# Patient Record
Sex: Female | Born: 1955 | Race: White | Hispanic: No | Marital: Single | Smoking: Current every day smoker
Health system: Southern US, Community
[De-identification: ages and names within clinical notes are randomized; demographics above are authoritative.]

## PROBLEM LIST (undated history)

## (undated) DIAGNOSIS — G43909 Migraine, unspecified, not intractable, without status migrainosus: Secondary | ICD-10-CM

## (undated) DIAGNOSIS — F329 Major depressive disorder, single episode, unspecified: Secondary | ICD-10-CM

## (undated) DIAGNOSIS — G20A1 Parkinson's disease without dyskinesia, without mention of fluctuations: Secondary | ICD-10-CM

## (undated) DIAGNOSIS — K3184 Gastroparesis: Secondary | ICD-10-CM

## (undated) DIAGNOSIS — C801 Malignant (primary) neoplasm, unspecified: Secondary | ICD-10-CM

## (undated) DIAGNOSIS — M549 Dorsalgia, unspecified: Secondary | ICD-10-CM

## (undated) DIAGNOSIS — T753XXA Motion sickness, initial encounter: Secondary | ICD-10-CM

## (undated) DIAGNOSIS — Z972 Presence of dental prosthetic device (complete) (partial): Secondary | ICD-10-CM

## (undated) DIAGNOSIS — E039 Hypothyroidism, unspecified: Secondary | ICD-10-CM

## (undated) DIAGNOSIS — M7550 Bursitis of unspecified shoulder: Secondary | ICD-10-CM

## (undated) DIAGNOSIS — G20 Parkinson's disease: Secondary | ICD-10-CM

## (undated) DIAGNOSIS — N3941 Urge incontinence: Secondary | ICD-10-CM

## (undated) DIAGNOSIS — K219 Gastro-esophageal reflux disease without esophagitis: Secondary | ICD-10-CM

## (undated) DIAGNOSIS — J449 Chronic obstructive pulmonary disease, unspecified: Secondary | ICD-10-CM

## (undated) DIAGNOSIS — F32A Depression, unspecified: Secondary | ICD-10-CM

## (undated) DIAGNOSIS — B001 Herpesviral vesicular dermatitis: Secondary | ICD-10-CM

## (undated) DIAGNOSIS — G4700 Insomnia, unspecified: Secondary | ICD-10-CM

## (undated) DIAGNOSIS — H9190 Unspecified hearing loss, unspecified ear: Secondary | ICD-10-CM

## (undated) HISTORY — DX: Parkinson's disease: G20

## (undated) HISTORY — PX: CARPAL TUNNEL RELEASE: SHX101

## (undated) HISTORY — DX: Dorsalgia, unspecified: M54.9

## (undated) HISTORY — DX: Gastro-esophageal reflux disease without esophagitis: K21.9

## (undated) HISTORY — DX: Parkinson's disease without dyskinesia, without mention of fluctuations: G20.A1

## (undated) HISTORY — DX: Depression, unspecified: F32.A

## (undated) HISTORY — PX: TONSILLECTOMY AND ADENOIDECTOMY: SUR1326

## (undated) HISTORY — DX: Major depressive disorder, single episode, unspecified: F32.9

## (undated) HISTORY — DX: Urge incontinence: N39.41

## (undated) HISTORY — DX: Gastroparesis: K31.84

## (undated) HISTORY — PX: LAPAROSCOPY: SHX197

## (undated) HISTORY — DX: Herpesviral vesicular dermatitis: B00.1

## (undated) HISTORY — DX: Bursitis of unspecified shoulder: M75.50

## (undated) HISTORY — PX: APPENDECTOMY: SHX54

## (undated) HISTORY — DX: Migraine, unspecified, not intractable, without status migrainosus: G43.909

## (undated) HISTORY — DX: Insomnia, unspecified: G47.00

## (undated) HISTORY — DX: Hypothyroidism, unspecified: E03.9

---

## 1988-12-26 HISTORY — PX: OVARIAN CYST REMOVAL: SHX89

## 1994-11-25 ENCOUNTER — Encounter: Payer: Self-pay | Admitting: Family Medicine

## 1997-12-26 HISTORY — PX: ABDOMINAL HYSTERECTOMY: SHX81

## 1999-04-29 ENCOUNTER — Other Ambulatory Visit: Admission: RE | Admit: 1999-04-29 | Discharge: 1999-04-29 | Payer: Self-pay | Admitting: Family Medicine

## 2001-05-26 ENCOUNTER — Encounter (INDEPENDENT_AMBULATORY_CARE_PROVIDER_SITE_OTHER): Payer: Self-pay | Admitting: Internal Medicine

## 2002-02-10 ENCOUNTER — Encounter: Payer: Self-pay | Admitting: Family Medicine

## 2003-09-29 ENCOUNTER — Ambulatory Visit (HOSPITAL_COMMUNITY): Admission: RE | Admit: 2003-09-29 | Discharge: 2003-09-29 | Payer: Self-pay | Admitting: Neurosurgery

## 2003-09-29 ENCOUNTER — Encounter: Payer: Self-pay | Admitting: Neurosurgery

## 2003-10-10 ENCOUNTER — Encounter: Payer: Self-pay | Admitting: Neurosurgery

## 2003-10-10 ENCOUNTER — Encounter: Admission: RE | Admit: 2003-10-10 | Discharge: 2003-10-10 | Payer: Self-pay | Admitting: Neurosurgery

## 2003-11-04 ENCOUNTER — Encounter: Admission: RE | Admit: 2003-11-04 | Discharge: 2003-11-04 | Payer: Self-pay | Admitting: Neurosurgery

## 2004-08-16 ENCOUNTER — Encounter: Payer: Self-pay | Admitting: Family Medicine

## 2004-10-26 ENCOUNTER — Ambulatory Visit: Payer: Self-pay | Admitting: Family Medicine

## 2004-12-03 ENCOUNTER — Ambulatory Visit: Payer: Self-pay | Admitting: Family Medicine

## 2006-01-26 HISTORY — PX: ESOPHAGOGASTRODUODENOSCOPY: SHX1529

## 2006-03-07 ENCOUNTER — Ambulatory Visit: Payer: Self-pay | Admitting: Family Medicine

## 2006-03-15 ENCOUNTER — Ambulatory Visit: Payer: Self-pay | Admitting: Gastroenterology

## 2006-03-20 ENCOUNTER — Encounter (INDEPENDENT_AMBULATORY_CARE_PROVIDER_SITE_OTHER): Payer: Self-pay | Admitting: *Deleted

## 2006-03-20 ENCOUNTER — Ambulatory Visit (HOSPITAL_COMMUNITY): Admission: RE | Admit: 2006-03-20 | Discharge: 2006-03-20 | Payer: Self-pay | Admitting: Gastroenterology

## 2006-03-30 ENCOUNTER — Ambulatory Visit: Payer: Self-pay | Admitting: Family Medicine

## 2006-04-04 ENCOUNTER — Ambulatory Visit: Payer: Self-pay | Admitting: Family Medicine

## 2006-05-10 ENCOUNTER — Ambulatory Visit: Payer: Self-pay | Admitting: Family Medicine

## 2006-09-15 ENCOUNTER — Ambulatory Visit: Payer: Self-pay | Admitting: Family Medicine

## 2006-12-13 ENCOUNTER — Ambulatory Visit: Payer: Self-pay | Admitting: Family Medicine

## 2007-02-28 ENCOUNTER — Ambulatory Visit: Payer: Self-pay | Admitting: Family Medicine

## 2007-02-28 LAB — CONVERTED CEMR LAB
Free T4: 0.8 ng/dL (ref 0.6–1.6)
TSH: 2.8 microintl units/mL (ref 0.35–5.50)

## 2007-05-15 ENCOUNTER — Telehealth (INDEPENDENT_AMBULATORY_CARE_PROVIDER_SITE_OTHER): Payer: Self-pay | Admitting: *Deleted

## 2007-06-04 ENCOUNTER — Telehealth (INDEPENDENT_AMBULATORY_CARE_PROVIDER_SITE_OTHER): Payer: Self-pay | Admitting: *Deleted

## 2007-06-05 ENCOUNTER — Ambulatory Visit: Payer: Self-pay | Admitting: Family Medicine

## 2007-06-05 DIAGNOSIS — K219 Gastro-esophageal reflux disease without esophagitis: Secondary | ICD-10-CM

## 2007-06-05 DIAGNOSIS — G43909 Migraine, unspecified, not intractable, without status migrainosus: Secondary | ICD-10-CM | POA: Insufficient documentation

## 2007-06-05 DIAGNOSIS — F319 Bipolar disorder, unspecified: Secondary | ICD-10-CM

## 2007-06-05 DIAGNOSIS — K589 Irritable bowel syndrome without diarrhea: Secondary | ICD-10-CM

## 2007-06-05 DIAGNOSIS — IMO0001 Reserved for inherently not codable concepts without codable children: Secondary | ICD-10-CM

## 2007-06-05 DIAGNOSIS — E039 Hypothyroidism, unspecified: Secondary | ICD-10-CM

## 2007-06-05 DIAGNOSIS — N6019 Diffuse cystic mastopathy of unspecified breast: Secondary | ICD-10-CM

## 2007-06-05 DIAGNOSIS — K449 Diaphragmatic hernia without obstruction or gangrene: Secondary | ICD-10-CM

## 2007-06-05 DIAGNOSIS — F310 Bipolar disorder, current episode hypomanic: Secondary | ICD-10-CM | POA: Insufficient documentation

## 2007-06-05 DIAGNOSIS — K3184 Gastroparesis: Secondary | ICD-10-CM

## 2007-06-05 LAB — CONVERTED CEMR LAB: Glucose, Bld: 97 mg/dL

## 2007-06-06 LAB — CONVERTED CEMR LAB
BUN: 3 mg/dL — ABNORMAL LOW (ref 6–23)
Basophils Relative: 1 % (ref 0.0–1.0)
Bilirubin, Direct: 0.1 mg/dL (ref 0.0–0.3)
Calcium: 10.2 mg/dL (ref 8.4–10.5)
Chloride: 106 meq/L (ref 96–112)
Eosinophils Absolute: 0.1 10*3/uL (ref 0.0–0.6)
Eosinophils Relative: 0.9 % (ref 0.0–5.0)
GFR calc Af Amer: 98 mL/min
GFR calc non Af Amer: 81 mL/min
Hemoglobin: 14.7 g/dL (ref 12.0–15.0)
Lymphocytes Relative: 23.4 % (ref 12.0–46.0)
MCV: 91.6 fL (ref 78.0–100.0)
Monocytes Absolute: 0.8 10*3/uL — ABNORMAL HIGH (ref 0.2–0.7)
Neutro Abs: 9.1 10*3/uL — ABNORMAL HIGH (ref 1.4–7.7)
Platelets: 280 10*3/uL (ref 150–400)
Potassium: 4 meq/L (ref 3.5–5.1)
Rhuematoid fact SerPl-aCnc: <20 intl units/mL — ABNORMAL LOW (ref 0.0–20.0)
Sed Rate: 13 mm/hr (ref 0–25)
TSH: 0.65 microintl units/mL (ref 0.35–5.50)
Total Bilirubin: 0.7 mg/dL (ref 0.3–1.2)
Total Protein: 6.9 g/dL (ref 6.0–8.3)
WBC: 13.2 10*3/uL — ABNORMAL HIGH (ref 4.5–10.5)

## 2007-06-11 ENCOUNTER — Telehealth: Payer: Self-pay | Admitting: Family Medicine

## 2007-06-13 ENCOUNTER — Encounter (INDEPENDENT_AMBULATORY_CARE_PROVIDER_SITE_OTHER): Payer: Self-pay | Admitting: Internal Medicine

## 2007-06-13 DIAGNOSIS — N35919 Unspecified urethral stricture, male, unspecified site: Secondary | ICD-10-CM

## 2007-10-31 ENCOUNTER — Ambulatory Visit: Payer: Self-pay | Admitting: Internal Medicine

## 2007-10-31 DIAGNOSIS — M25519 Pain in unspecified shoulder: Secondary | ICD-10-CM | POA: Insufficient documentation

## 2007-11-15 ENCOUNTER — Encounter (INDEPENDENT_AMBULATORY_CARE_PROVIDER_SITE_OTHER): Payer: Self-pay | Admitting: Internal Medicine

## 2007-12-03 ENCOUNTER — Encounter (INDEPENDENT_AMBULATORY_CARE_PROVIDER_SITE_OTHER): Payer: Self-pay | Admitting: Internal Medicine

## 2008-02-06 ENCOUNTER — Encounter (INDEPENDENT_AMBULATORY_CARE_PROVIDER_SITE_OTHER): Payer: Self-pay | Admitting: Internal Medicine

## 2008-02-07 ENCOUNTER — Telehealth (INDEPENDENT_AMBULATORY_CARE_PROVIDER_SITE_OTHER): Payer: Self-pay | Admitting: Internal Medicine

## 2008-02-27 ENCOUNTER — Ambulatory Visit: Payer: Self-pay | Admitting: Family Medicine

## 2008-02-27 ENCOUNTER — Telehealth: Payer: Self-pay | Admitting: Family Medicine

## 2008-03-27 ENCOUNTER — Telehealth: Payer: Self-pay | Admitting: Family Medicine

## 2008-03-31 ENCOUNTER — Telehealth (INDEPENDENT_AMBULATORY_CARE_PROVIDER_SITE_OTHER): Payer: Self-pay | Admitting: Internal Medicine

## 2008-05-15 ENCOUNTER — Ambulatory Visit: Payer: Self-pay | Admitting: Family Medicine

## 2008-09-24 ENCOUNTER — Ambulatory Visit: Payer: Self-pay | Admitting: Family Medicine

## 2008-09-24 DIAGNOSIS — G5600 Carpal tunnel syndrome, unspecified upper limb: Secondary | ICD-10-CM

## 2008-09-24 DIAGNOSIS — K149 Disease of tongue, unspecified: Secondary | ICD-10-CM

## 2008-09-24 DIAGNOSIS — R635 Abnormal weight gain: Secondary | ICD-10-CM | POA: Insufficient documentation

## 2008-10-30 ENCOUNTER — Ambulatory Visit: Payer: Self-pay | Admitting: Family Medicine

## 2008-12-15 ENCOUNTER — Telehealth: Payer: Self-pay | Admitting: Family Medicine

## 2009-01-06 ENCOUNTER — Ambulatory Visit: Payer: Self-pay | Admitting: Family Medicine

## 2009-01-20 ENCOUNTER — Telehealth: Payer: Self-pay | Admitting: Family Medicine

## 2009-04-16 ENCOUNTER — Ambulatory Visit: Payer: Self-pay | Admitting: Family Medicine

## 2009-04-16 DIAGNOSIS — N3941 Urge incontinence: Secondary | ICD-10-CM

## 2009-04-16 DIAGNOSIS — Z87448 Personal history of other diseases of urinary system: Secondary | ICD-10-CM

## 2009-04-17 ENCOUNTER — Telehealth: Payer: Self-pay | Admitting: Family Medicine

## 2009-04-17 LAB — CONVERTED CEMR LAB: Lithium Lvl: 0.83 meq/L (ref 0.80–1.40)

## 2009-05-04 ENCOUNTER — Ambulatory Visit: Payer: Self-pay | Admitting: Urology

## 2009-05-12 ENCOUNTER — Ambulatory Visit: Payer: Self-pay | Admitting: Family Medicine

## 2009-05-12 ENCOUNTER — Ambulatory Visit: Payer: Self-pay | Admitting: Gastroenterology

## 2009-05-12 DIAGNOSIS — R1110 Vomiting, unspecified: Secondary | ICD-10-CM | POA: Insufficient documentation

## 2009-05-12 DIAGNOSIS — R1011 Right upper quadrant pain: Secondary | ICD-10-CM

## 2009-05-12 DIAGNOSIS — R259 Unspecified abnormal involuntary movements: Secondary | ICD-10-CM | POA: Insufficient documentation

## 2009-05-12 DIAGNOSIS — R112 Nausea with vomiting, unspecified: Secondary | ICD-10-CM

## 2009-05-12 LAB — CONVERTED CEMR LAB
ALT: 25 units/L (ref 0–35)
Alkaline Phosphatase: 119 units/L — ABNORMAL HIGH (ref 39–117)
Basophils Absolute: 0.1 10*3/uL (ref 0.0–0.1)
CO2: 31 meq/L (ref 19–32)
Creatinine, Ser: 0.8 mg/dL (ref 0.4–1.2)
GFR calc non Af Amer: 80.55 mL/min (ref >60)
HCT: 41.9 % (ref 36.0–46.0)
Hemoglobin: 14.8 g/dL (ref 12.0–15.0)
Lymphs Abs: 3 10*3/uL (ref 0.7–4.0)
MCHC: 35.4 g/dL (ref 30.0–36.0)
MCV: 93.4 fL (ref 78.0–100.0)
Monocytes Absolute: 1 10*3/uL (ref 0.1–1.0)
Monocytes Relative: 6.8 % (ref 3.0–12.0)
Neutro Abs: 10 10*3/uL — ABNORMAL HIGH (ref 1.4–7.7)
RDW: 12.3 % (ref 11.5–14.6)
Total Bilirubin: 0.4 mg/dL (ref 0.3–1.2)

## 2009-05-13 ENCOUNTER — Telehealth: Payer: Self-pay | Admitting: Gastroenterology

## 2009-05-18 ENCOUNTER — Ambulatory Visit (HOSPITAL_COMMUNITY): Admission: RE | Admit: 2009-05-18 | Discharge: 2009-05-18 | Payer: Self-pay | Admitting: Gastroenterology

## 2009-05-21 ENCOUNTER — Encounter: Payer: Self-pay | Admitting: Family Medicine

## 2009-06-30 ENCOUNTER — Ambulatory Visit: Payer: Self-pay | Admitting: Gastroenterology

## 2009-07-17 ENCOUNTER — Encounter: Payer: Self-pay | Admitting: Family Medicine

## 2009-08-09 ENCOUNTER — Ambulatory Visit: Payer: Self-pay | Admitting: Internal Medicine

## 2009-08-13 ENCOUNTER — Ambulatory Visit: Payer: Self-pay | Admitting: Internal Medicine

## 2009-08-19 ENCOUNTER — Encounter: Payer: Self-pay | Admitting: Family Medicine

## 2009-08-26 ENCOUNTER — Telehealth: Payer: Self-pay | Admitting: Family Medicine

## 2009-09-02 ENCOUNTER — Ambulatory Visit: Payer: Self-pay | Admitting: Internal Medicine

## 2009-09-17 ENCOUNTER — Ambulatory Visit: Payer: Self-pay | Admitting: Otolaryngology

## 2009-12-02 ENCOUNTER — Telehealth: Payer: Self-pay | Admitting: Family Medicine

## 2009-12-07 ENCOUNTER — Encounter (INDEPENDENT_AMBULATORY_CARE_PROVIDER_SITE_OTHER): Payer: Self-pay | Admitting: *Deleted

## 2009-12-24 ENCOUNTER — Ambulatory Visit: Payer: Self-pay | Admitting: Family Medicine

## 2010-01-15 ENCOUNTER — Encounter: Payer: Self-pay | Admitting: Family Medicine

## 2010-01-19 ENCOUNTER — Telehealth: Payer: Self-pay | Admitting: Family Medicine

## 2010-03-03 ENCOUNTER — Telehealth: Payer: Self-pay | Admitting: Family Medicine

## 2010-03-23 ENCOUNTER — Encounter: Payer: Self-pay | Admitting: Family Medicine

## 2010-03-25 ENCOUNTER — Ambulatory Visit: Payer: Self-pay | Admitting: Family Medicine

## 2010-03-25 DIAGNOSIS — D72829 Elevated white blood cell count, unspecified: Secondary | ICD-10-CM | POA: Insufficient documentation

## 2010-03-26 LAB — CONVERTED CEMR LAB
Basophils Relative: 0.3 % (ref 0.0–3.0)
CO2: 26 meq/L (ref 19–32)
Chloride: 105 meq/L (ref 96–112)
Creatinine, Ser: 1 mg/dL (ref 0.4–1.2)
Eosinophils Absolute: 0.6 10*3/uL (ref 0.0–0.7)
Hemoglobin: 14.4 g/dL (ref 12.0–15.0)
Lymphs Abs: 3.9 10*3/uL (ref 0.7–4.0)
MCHC: 34.1 g/dL (ref 30.0–36.0)
MCV: 95.7 fL (ref 78.0–100.0)
Monocytes Absolute: 0.7 10*3/uL (ref 0.1–1.0)
Neutro Abs: 11.2 10*3/uL — ABNORMAL HIGH (ref 1.4–7.7)
RBC: 4.43 M/uL (ref 3.87–5.11)
Sodium: 143 meq/L (ref 135–145)

## 2010-04-12 ENCOUNTER — Ambulatory Visit: Payer: Self-pay | Admitting: Family Medicine

## 2010-04-12 ENCOUNTER — Ambulatory Visit: Payer: Self-pay | Admitting: Internal Medicine

## 2010-04-19 ENCOUNTER — Emergency Department: Payer: Self-pay | Admitting: Emergency Medicine

## 2010-04-19 ENCOUNTER — Encounter: Payer: Self-pay | Admitting: Family Medicine

## 2010-04-28 ENCOUNTER — Encounter: Payer: Self-pay | Admitting: Family Medicine

## 2010-04-29 ENCOUNTER — Telehealth: Payer: Self-pay | Admitting: Family Medicine

## 2010-04-30 ENCOUNTER — Encounter: Payer: Self-pay | Admitting: Family Medicine

## 2010-05-03 ENCOUNTER — Telehealth: Payer: Self-pay | Admitting: Family Medicine

## 2010-05-04 ENCOUNTER — Encounter: Payer: Self-pay | Admitting: Family Medicine

## 2010-05-04 ENCOUNTER — Emergency Department: Payer: Self-pay | Admitting: Internal Medicine

## 2010-05-06 ENCOUNTER — Ambulatory Visit: Payer: Self-pay | Admitting: Family Medicine

## 2010-06-01 ENCOUNTER — Telehealth (INDEPENDENT_AMBULATORY_CARE_PROVIDER_SITE_OTHER): Payer: Self-pay | Admitting: *Deleted

## 2010-06-01 ENCOUNTER — Telehealth: Payer: Self-pay | Admitting: Family Medicine

## 2010-06-02 ENCOUNTER — Telehealth: Payer: Self-pay | Admitting: Family Medicine

## 2010-06-03 ENCOUNTER — Telehealth: Payer: Self-pay | Admitting: Family Medicine

## 2010-06-15 ENCOUNTER — Ambulatory Visit: Payer: Self-pay | Admitting: Gastroenterology

## 2010-07-19 ENCOUNTER — Telehealth: Payer: Self-pay | Admitting: Family Medicine

## 2010-08-03 ENCOUNTER — Telehealth: Payer: Self-pay | Admitting: Gastroenterology

## 2010-08-03 ENCOUNTER — Encounter (INDEPENDENT_AMBULATORY_CARE_PROVIDER_SITE_OTHER): Payer: Self-pay | Admitting: *Deleted

## 2010-08-26 ENCOUNTER — Telehealth: Payer: Self-pay | Admitting: Family Medicine

## 2010-08-31 ENCOUNTER — Ambulatory Visit: Payer: Self-pay | Admitting: Family Medicine

## 2010-09-06 ENCOUNTER — Telehealth (INDEPENDENT_AMBULATORY_CARE_PROVIDER_SITE_OTHER): Payer: Self-pay | Admitting: *Deleted

## 2010-09-08 ENCOUNTER — Telehealth: Payer: Self-pay | Admitting: Family Medicine

## 2010-09-09 ENCOUNTER — Telehealth: Payer: Self-pay | Admitting: Family Medicine

## 2010-09-29 ENCOUNTER — Ambulatory Visit: Payer: Self-pay | Admitting: Family Medicine

## 2010-09-29 ENCOUNTER — Telehealth (INDEPENDENT_AMBULATORY_CARE_PROVIDER_SITE_OTHER): Payer: Self-pay | Admitting: *Deleted

## 2010-10-01 ENCOUNTER — Telehealth: Payer: Self-pay | Admitting: Family Medicine

## 2010-10-07 ENCOUNTER — Telehealth: Payer: Self-pay | Admitting: Family Medicine

## 2010-10-07 DIAGNOSIS — H00019 Hordeolum externum unspecified eye, unspecified eyelid: Secondary | ICD-10-CM

## 2010-10-22 ENCOUNTER — Encounter: Payer: Self-pay | Admitting: Gastroenterology

## 2010-10-26 ENCOUNTER — Encounter: Payer: Self-pay | Admitting: Family Medicine

## 2010-11-02 ENCOUNTER — Telehealth: Payer: Self-pay | Admitting: Family Medicine

## 2010-11-03 ENCOUNTER — Telehealth: Payer: Self-pay | Admitting: Family Medicine

## 2010-11-03 ENCOUNTER — Encounter: Payer: Self-pay | Admitting: Family Medicine

## 2010-12-28 ENCOUNTER — Encounter: Payer: Self-pay | Admitting: Family Medicine

## 2011-01-10 ENCOUNTER — Telehealth: Payer: Self-pay | Admitting: Family Medicine

## 2011-01-14 ENCOUNTER — Telehealth: Payer: Self-pay | Admitting: Gastroenterology

## 2011-01-27 NOTE — Progress Notes (Signed)
Summary: Rx Chantix and Valtrex  Phone Note Refill Request Call back at [phone] Message from:  [id number] on March 03, 2010 8:44 AM  Refills Requested: Medication #1:  VALTREX 500 MG  TABS Take 1 capsule by mouth two times a day   Last Refilled: 03/29/2009 Request refill on Chantix 1mg  tablet, take one tablet two times a day #60, was removed from med sheet on 04/16/09    Method Requested: Electronic Initial call taken by: Sydell Axon LPN,  March 03, 2010 8:46 AM    New/Updated Medications: CHANTIX 1 MG TABS (VARENICLINE TARTRATE) one tab by mouth two times a day Prescriptions: VALTREX 500 MG  TABS (VALACYCLOVIR HCL) Take 1 capsule by mouth two times a day  #30 x 6   Entered and Authorized by:   Shaune Leeks MD   Signed by:   Shaune Leeks MD on 03/03/2010   Method used:   Electronically to        [facility] [address] (retail)       [address]       [place], [place]  [address]       Ph: [phone]       Fax: [phone]   RxID:   [id number] CHANTIX 1 MG TABS (VARENICLINE TARTRATE) one tab by mouth two times a day  #60 x 5   Entered and Authorized by:   Shaune Leeks MD   Signed by:   Shaune Leeks MD on 03/03/2010   Method used:   Electronically to        [facility] [address] (retail)       [address]       [place], [place]  [address]       Ph: [phone]       Fax: [phone]   RxID:   [id number]

## 2011-01-27 NOTE — Letter (Signed)
Summary: Generic Letter  [place] at [facility]  [address] [place], [place] [address]   Phone: [phone]  Fax: [phone]    11/03/2010  [address]  RE: Leslie Wong     [address]     [place], [place]  [address]  To Whom it may concern:   Ms Leslie Wong is a patient of mine with multiple medical problems which would preclude her from being a productive member of a jury. She has difficulty sitting for any prologed period of time due to back pain with radiculopathy and has been having prolonged difficulty with nausea and vomiting felt most recently due to necessary medications. I do not expect these problems to get appreciably better in the future and think permanent excusal is prudent.  If there are further questions, please feel free to call my office. Thank you for your attention and cooperation in this matter.    Sincerely,     Laurita Quint MD

## 2011-01-27 NOTE — Progress Notes (Signed)
Summary: still wants pain medicine  Phone Note Call from Patient Call back at Home Phone [phone]   Caller: Patient Summary of Call: Pt is insisting that something be called in for her for pain.  She says she does not have a pain doctor in [place], saw a neurologist 3 months ago, is unable to get in touch with anyone there.  She asks what is she supposed to do for her pain.  She says she can take vicodin without any problems. Initial call taken by: Lowella Petties CMA,  September 09, 2010 10:34 AM  Follow-up for Phone Call        No rx for pain meds.  I have prev explained this to the patient. She needs to contact [facility].  If we can help with re-referral, then please make this available to patient.  Follow-up by: Crawford Givens MD,  September 09, 2010 10:58 AM  Additional Follow-up for Phone Call Additional follow up Details #1::        Regina/Cynthia.  I have explained this to the patient over and over again.  I don't know what else to do.  There have been numerous phone conversations about this and last time she told me she was going to get another MD.  Delilah Shan CMA (AAMA)  September 09, 2010 11:01 AM     Additional Follow-up for Phone Call Additional follow up Details #2::    Noted.  Follow-up by: Crawford Givens MD,  September 09, 2010 1:39 PM

## 2011-01-27 NOTE — Progress Notes (Signed)
Summary: f/u appt   Phone Note Outgoing Call Call back at [phone]   Call placed by: Chales Abrahams CMA Duncan Dull),  June 01, 2010 4:51 PM Call placed to: Patient Summary of Call: Dr Christella Hartigan sent a flag to have the pt schedule a follow up with him for nausea and vomiting.  appt made for 06/15/10  pt aware Initial call taken by: Chales Abrahams CMA Duncan Dull),  June 01, 2010 4:51 PM

## 2011-01-27 NOTE — Progress Notes (Signed)
Summary: Yeast infection  Phone Note Call from Patient Call back at [phone] cell   Caller: Patient Call For: Shaune Leeks MD Summary of Call: Dr. Hetty Ely gave pt antibiotic 2 weeks ago  and now has yeast infection, Vaginal discharge with itching and burning in vaginal area. Pt has used Monistat and Vagisil OTC and is no better. Pt would like med called in for yeast infection. Pt uses [facility] in [place],[phone]. Please advise.  Initial call taken by: Lewanda Rife LPN,  January 19, 2010 2:12 PM  Follow-up for Phone Call        Patient notified as instructed by telephone that med had been sent to [facility] in [place].Lewanda Rife LPN  January 19, 2010 5:11 PM     New/Updated Medications: FLUCONAZOLE 150 MG TABS (FLUCONAZOLE) one tab by mouth   once daily Prescriptions: FLUCONAZOLE 150 MG TABS (FLUCONAZOLE) one tab by mouth   once daily  #1 x 0   Entered and Authorized by:   Shaune Leeks MD   Signed by:   Shaune Leeks MD on 01/19/2010   Method used:   Electronically to        [facility] [address] (retail)       [address]       [place], [place]  [address]       Ph: [phone]       Fax: [phone]   RxID:   [id number]

## 2011-01-27 NOTE — Miscellaneous (Signed)
Summary: Zofran refill  Clinical Lists Changes  Medications: Changed medication from ZOFRAN 8 MG TABS (ONDANSETRON HCL) 1 by mouth once daily as needed to [facility] ODT 4 MG TBDP (ONDANSETRON) 1 tab every 4-6 hours as needed - Signed Rx of ZOFRAN ODT 4 MG TBDP (ONDANSETRON) 1 tab every 4-6 hours as needed;  #12 x 0;  Signed;  Entered by: Chales Abrahams CMA (AAMA);  Authorized by: Rachael Fee MD;  Method used: Electronically to [facility] [address], [place], [place], [place]  [address], Ph: [phone], Fax: [phone]    Prescriptions: ZOFRAN ODT 4 MG TBDP (ONDANSETRON) 1 tab every 4-6 hours as needed  #12 x 0   Entered by:   Chales Abrahams CMA (AAMA)   Authorized by:   Rachael Fee MD   Signed by:   Chales Abrahams CMA (AAMA) on 10/22/2010   Method used:   Electronically to        [facility] [address] (retail)       [address]       [place], [place]  [address]       Ph: [phone]       Fax: [phone]   RxID:   [id number]

## 2011-01-27 NOTE — Letter (Signed)
Summary: Dr.Peter Kemper [facility]  Dr.Peter Kemper [facility]   Imported By: Beau Fanny 10/28/2010 16:44:31  _____________________________________________________________________  External Attachment:    Type:   Image     Comment:   External Document

## 2011-01-27 NOTE — Progress Notes (Signed)
Summary: prescription change request   Phone Note From Pharmacy   [facility]: [facility] [address] Summary of Call: request recieved from pharmacy that the pt would like zofran 8 mg ODT tabs instead of ondansetron 8 mg tab.  Is this ok? Initial call taken by: Chales Abrahams CMA Duncan Dull),  September 29, 2010 2:46 PM  Follow-up for Phone Call        yes Follow-up by: Rachael Fee MD,  September 29, 2010 2:55 PM  Additional Follow-up for Phone Call Additional follow up Details #1::        FYI:  Dr Christella Hartigan I just noticed the pt is getting zofran from Dr Para March as well. 08/03/10 30 with 5 refills and then on 08/31/10 Dr Para March gave  her 30 with 5 refills. Additional Follow-up by: Chales Abrahams CMA Duncan Dull),  September 29, 2010 3:05 PM    Additional Follow-up for Phone Call Additional follow up Details #2::    can you ask her about that.  should really only have one prescriber. Follow-up by: Rachael Fee MD,  September 29, 2010 3:06 PM  Additional Follow-up for Phone Call Additional follow up Details #3:: Details for Additional Follow-up Action Taken: phone number given has been disconnected pharmacy notified to have pt contact us before med can be refilled. Chales Abrahams CMA Duncan Dull)  September 29, 2010 3:11 PM   Dr Christella Hartigan I spoke with the pharmacy and they have only given her the rx you sent, the other from Dr Para March is on file.  I will send the Zofran ODT for her she is vomiting up the tablets. Additional Follow-up by: Chales Abrahams CMA Duncan Dull),  September 29, 2010 3:18 PM

## 2011-01-27 NOTE — Progress Notes (Signed)
Summary: wants 3 months of nucynta  Phone Note Call from Patient   Caller: Daughter Dessa Phi  [phone] Summary of Call: Pt's daughter is asking if pt can have 3 month scripts of nucynta so that she doesnt have to drive the 45 minutes that it takes for her to get here. Initial call taken by: Lowella Petties CMA,  June 03, 2010 12:43 PM  Follow-up for Phone Call        I don't prescribe narcotic medication for more than one month at a time...too many potential problems with a 90 day prescription. Follow-up by: Shaune Leeks MD,  June 03, 2010 1:20 PM  Additional Follow-up for Phone Call Additional follow up Details #1::        Advised pt's daughter.                Lowella Petties CMA  June 03, 2010 3:28 PM

## 2011-01-27 NOTE — Letter (Signed)
Summary: Dr.Fletcher Hartsell,Neurology,Note  Dr.Fletcher Hartsell,Neurology,Note   Imported By: Beau Fanny 01/19/2010 14:06:06  _____________________________________________________________________  External Attachment:    Type:   Image     Comment:   External Document

## 2011-01-27 NOTE — Assessment & Plan Note (Signed)
Summary: DISCUSS LAB RESULTS FROM DR KAROL/CE   Vital Signs:  Patient profile:   55 year old female Weight:      129.50 pounds Temp:     98.5 degrees F oral Pulse rate:   84 / minute Pulse rhythm:   regular BP sitting:   104 / 64  (left arm) Cuff size:   regular  Vitals Entered By: Sydell Axon LPN (March 25, 2010 3:21 PM) CC: Discuss lab work done by Dr. Zola Button, still throwing up at times   History of Present Illness: Pt here for elevated WBC found at her psychiatrist. Her labs also showed a minimally elevated Ca. She is on Lithium and her Psych thinks the elevated WBC is probably from that but would like to iinsure no atypical lymphocytes as a differential was not done.  She has her nausea always in the morning occas which sometimes accelerates to vomiting. She takes a no. of meds in the AM. She has no abd pain except with dry heaves. She is on Chantix as well.  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 1 Twice A Day By Mouth 2)  Prilosec 20 Mg Cpdr (Omeprazole) .Marland Kitchen.. 1daily By  Mouth 3)  Levoxyl 88 Mcg Tabs (Levothyroxine Sodium) .Marland Kitchen.. 1 Daily By Mouth 4)  Aspir-Low 81 Mg Tbec (Aspirin) .Marland Kitchen.. 1 Daily By Mouth 5)  Lithium Carbonate 300 Mg  Tabs (Lithium Carbonate) .... 2 Tablets By Mouth At Bedtime 6)  Wellbutrin Xl 300 Mg  Tb24 (Bupropion Hcl) .... Take 1 Tablet By Mouth Once A Day 7)  Valtrex 500 Mg  Tabs (Valacyclovir Hcl) .... Take 1 Capsule By Mouth Two Times A Day 8)  Tramadol Hcl 50 Mg Tabs (Tramadol Hcl) .Marland Kitchen.. 1 Tablet Four Times A Day As Needed 9)  Promethazine Hcl 25 Mg Tabs (Promethazine Hcl) .... Take 1 Tablet Every 6 Hours As Needed For Nausea 10)  Black Cohosh 40 Mg Caps (Black Cohosh) .... Take 1 Tablet By Mouth Two Times A Day 11)  Ambien 10 Mg Tabs (Zolpidem Tartrate) .Marland Kitchen.. 1 Tab By Mouth At Bedtime 12)  Doxycycline Hyclate 100 Mg Caps (Doxycycline Hyclate) .... One Tab By Mouth Two Times A Day 13)  Fluconazole 150 Mg Tabs (Fluconazole) .... One Tab By Mouth   Once Daily 14)  Chantix 1 Mg Tabs (Varenicline Tartrate) .... One Tab By Mouth Two Times A Day  Allergies: 1)  ! Aspirin 2)  ! Codeine 3)  ! Prednisone 4)  ! Erythromycin 5)  ! * Antiinflammatories 6)  ! Reglan (Metoclopramide Hcl)  Physical Exam  General:  Well-developed,well-nourished,in no  acute distress; alert,appropriate and cooperative throughout examination, looks tired. Head:  Normocephalic and atraumatic without obvious abnormalities. No apparent alopecia or balding. Sinuses NT. Eyes:  Conjunctiva clear bilaterally.  Ears:  External ear exam shows no significant lesions or deformities.  Otoscopic examination reveals clear canals, tympanic membranes are intact bilaterally without bulging, retraction, inflammation or discharge. Hearing is grossly normal bilaterally.PETs in place. Nose:  External nasal examination shows no deformity or inflammation. Nasal mucosa are pink and moist without lesions or exudates. Mouth:  Oral mucosa and oropharynx without lesions or exudates.  Teeth in good  repair. Mildly dry mucosa. Neck:  No deformities, masses, or tenderness noted. Lungs:  Normal respiratory effort, chest expands symmetrically. Lungs are clear to auscultation, no crackles or wheezes. Heart:  Normal rate and regular rhythm. S1 and S2 normal without gallop, murmur, click, rub or other extra sounds. Abdomen:  Bowel sounds positive,abdomen soft and minimally tender globally without masses, organomegaly or hernias noted.   Impression & Recommendations:  Problem # 1:  LEUKOCYTOSIS (ICD-288.60) Assessment New Repeat CBC w/ diff. Might be slightly dehydrated. Orders: Venipuncture (91478) TLB-CBC Platelet - w/Differential (85025-CBCD)  Problem # 2:  HYPERCALCEMIA (ICD-275.42) Assessment: New Simply recheck. Orders: Venipuncture (29562) TLB-BMP (Basic Metabolic Panel-BMET) (80048-METABOL)  Problem # 3:  NAUSEA WITH VOMITING (ICD-787.01) Assessment: Unchanged Decrease Chantix and change Wellbutrin to after brfst.  Problem # 4:  DISORDER, BIPOLAR NOS (ICD-296.80) Assessment: Unchanged Stable per Psych .Marland KitchenTamula Wong seems fine.  Complete Medication List: 1)  Lorazepam 1 Mg Tabs (Lorazepam) .Marland Kitchen.. 1 twice a day by mouth 2)  Prilosec 20 Mg Cpdr (Omeprazole) .Marland Kitchen.. 1daily by mouth 3)  Levoxyl 88 Mcg Tabs (Levothyroxine sodium) .Marland Kitchen.. 1 daily by mouth 4)  Aspir-low 81 Mg Tbec (Aspirin) .Marland Kitchen.. 1 daily by mouth 5)  Lithium Carbonate 300 Mg Tabs (Lithium carbonate) .... 2 tablets by mouth at bedtime 6)  Wellbutrin Xl 300 Mg Tb24 (Bupropion hcl) .... Take 1 tablet by mouth once a day 7)  Valtrex 500 Mg Tabs (Valacyclovir hcl) .... Take 1 capsule by mouth two times a day 8)  Tramadol Hcl 50 Mg Tabs (Tramadol hcl) .Marland Kitchen.. 1 tablet four times a day as needed 9)  Promethazine Hcl 25 Mg Tabs (Promethazine hcl) .... Take 1 tablet every 6 hours as needed for nausea 10)  Black Cohosh 40 Mg Caps (Black cohosh) .... Take 1 tablet by mouth two times a day 11)  Ambien 10 Mg Tabs (Zolpidem  tartrate) .Marland Kitchen.. 1 tab by mouth at bedtime 12)  Chantix 1 Mg Tabs (Varenicline tartrate) .... One tab by mouth two times a day 13)  Gabapentin 300 Mg Caps (Gabapentin) .... Take one by mouth at bedtime  Patient Instructions: 1)  RTC 6 weeks for recheck 2)  Start Wellbutrin after brfst. 3)  Decrease Chantix to 1/2 tab by mouth two times a day.  Current Allergies (reviewed today): ! ASPIRIN ! CODEINE ! PREDNISONE ! ERYTHROMYCIN ! * ANTIINFLAMMATORIES ! REGLAN (METOCLOPRAMIDE HCL)  Appended Document: DISCUSS LAB RESULTS FROM DR KAROL/CE Will get Vit D also and have her increase fluids.

## 2011-01-27 NOTE — Letter (Signed)
Summary: Nadara Eaton letter  [place] at [facility]  [address] [place], [place] [address]   Phone: [phone]  Fax: [phone]       08/03/2010 MRN: [id number]  DANAYSIA RADER [address] [place], [place]  [address]  Dear Ms. Mayford Knife,  [facility] [place], and [facility] announce the retirement of Arta Silence, M.D., from full-time practice at the [facility] office effective June 24, 2010 and his plans of returning part-time.  It is important to Dr. Hetty Ely and to our practice that you understand that [facility] has seven physicians in our office for your health care needs.  We will continue to offer the same exceptional care that you have today.    Dr. Hetty Ely has spoken to many of you about his plans for retirement and returning part-time in the fall.   We will continue to work with you through the transition to schedule appointments for you in the office and meet the high standards that [place] [facility] is committed to.   Again, it is with great pleasure that we share the news that Dr. Hetty Ely will return to [facility] in October of 2011 with a reduced schedule.    If you have any questions, or would like to request an appointment with one of our physicians, please call us at [phone] and press the option for Scheduling an appointment.  We take pleasure in providing you with excellent patient care and look forward to seeing you at your next office visit.  Our [facility] [address] Physicians are:  Tillman Abide, M.D. Laurita Quint, M.D. Roxy Manns, M.D. Kerby Nora, M.D. Hannah Beat, M.D. Ruthe Mannan, M.D. We proudly welcomed Raechel Ache, M.D. and Eustaquio Boyden, M.D. to the practice in July/August 2011.  Sincerely,  [place] [facility]

## 2011-01-27 NOTE — Progress Notes (Signed)
Summary: refill requst for nucynta  Phone Note Refill Request Call back at Home Phone [phone] Message from:  Patient  Refills Requested: Medication #1:  NUCYNTA 50 MG TABS one tab by mouth every 6 hours as needed for pain Pt is asking for a refill, please call when ready.  Initial call taken by: Lowella Petties CMA,  July 19, 2010 11:39 AM  Follow-up for Phone Call       Follow-up by: Crawford Givens MD,  July 19, 2010 1:06 PM  Additional Follow-up for Phone Call Additional follow up Details #1::        Left message on voicemail  to return call. Lugene Fuquay CMA Cordarryl Monrreal Dull)  July 19, 2010 2:44 PM   I think this one needs to be printed for pick up as well.  Thanks  Lugene Fuquay CMA (AAMA)  July 19, 2010 3:02 PM     Prescriptions: NUCYNTA 50 MG TABS (TAPENTADOL HCL) one tab by mouth every 6 hours as needed for pain  #30 x 0   Entered and Authorized by:   Crawford Givens MD   Signed by:   Crawford Givens MD on 07/19/2010   Method used:   Print then Give to Patient   RxID:   [id number] NUCYNTA 50 MG TABS (TAPENTADOL HCL) one tab by mouth every 6 hours as needed for pain  #30 x 0   Entered and Authorized by:   Crawford Givens MD   Signed by:   Crawford Givens MD on 07/19/2010   Method used:   Telephoned to ...       [facility] [address] (retail)       [address]       [place], [place]  [address]       Ph: [phone]       Fax: [phone]   RxID:   [id number]

## 2011-01-27 NOTE — Progress Notes (Signed)
Summary: refill request for nucenta  Phone Note Refill Request Call back at [phone] Message from:  Patient  Refills Requested: Medication #1:  NUCYNTA 50 MG TABS one tab by mouth once a day Please call when ready.  Initial call taken by: Lowella Petties CMA, AAMA,  November 03, 2010 10:30 AM  Follow-up for Phone Call        Advised pt script is ready for pick up. Follow-up by: Lowella Petties CMA, AAMA,  November 03, 2010 11:24 AM    Prescriptions: NUCYNTA 50 MG TABS (TAPENTADOL HCL) one tab by mouth once a day  #30 x 0   Entered and Authorized by:   Shaune Leeks MD   Signed by:   Shaune Leeks MD on 11/03/2010   Method used:   Print then Give to Patient   RxID:   [id number]

## 2011-01-27 NOTE — Progress Notes (Signed)
Summary: Throwing up since sat  Medications Added ZOFRAN 8 MG TABS (ONDANSETRON HCL) 1 by mouth once daily as needed       Phone Note Call from Patient Call back at [phone]   Call For: Dr Christella Hartigan Summary of Call: Has been throwing up since saturday Initial call taken by: [facility],  August 03, 2010 12:48 PM  Follow-up for Phone Call        pt has stopped chantix and depakote and has started eating smaller meals still has nausea and vomiting,  wants 8 mg zofran instead of 4mg . Vomiting since Thursday.  She has MRI on Friday at [facility] and does not want to go while vomiting. Follow-up by: Chales Abrahams CMA Duncan Dull),  August 03, 2010 1:09 PM  Additional Follow-up for Phone Call Additional follow up Details #1::        ok, increase zofran to 8mg  a day. disp 30 with 5 refills Additional Follow-up by: Rachael Fee MD,  August 03, 2010 1:25 PM    New/Updated Medications: ZOFRAN 8 MG TABS (ONDANSETRON HCL) 1 by mouth once daily as needed Prescriptions: ZOFRAN 8 MG TABS (ONDANSETRON HCL) 1 by mouth once daily as needed  #30 x 5   Entered by:   Chales Abrahams CMA (AAMA)   Authorized by:   Rachael Fee MD   Signed by:   Chales Abrahams CMA (AAMA) on 08/03/2010   Method used:   Electronically to        [facility] [address] (retail)       [address]       [place], [place]  [address]       Ph: [phone]       Fax: [phone]   RxID:   [id number]

## 2011-01-27 NOTE — Consult Note (Signed)
Summary: Dr.Hemang Shah,Neurology,Note  Dr.Hemang Shah,Neurology,Note   Imported By: Beau Fanny 11/12/2010 10:42:24  _____________________________________________________________________  External Attachment:    Type:   Image     Comment:   External Document

## 2011-01-27 NOTE — Progress Notes (Signed)
Summary: VALACYCLOVIR   Phone Note Refill Request Message from:  [phone] on January 10, 2011 11:35 AM  E-Scribe Request for VALACYCLOVIR 500MG   take 1 by mouth two times a day, last refilled 12/27/2010, this medication no longer on active med list, ok to fill?   Method Requested: Electronic Initial call taken by: Mervin Hack CMA Duncan Dull),  January 10, 2011 11:36 AM  Follow-up for Phone Call        Need to know why pt is asking for this medication. No listing of herpes in the electronic chart. She has used this medication in the past, but not for a while.  Additional Follow-up for Phone Call Additional follow up Details #1::        Spoke to patient and was informed that she has fever blisters on her month and sores in her nose. Sydell Axon LPN  January 10, 2011 5:00 PM     New/Updated Medications: VALTREX 500 MG TABS (VALACYCLOVIR HCL) one tab by mouth two times a day Prescriptions: VALTREX 500 MG TABS (VALACYCLOVIR HCL) one tab by mouth two times a day  #30 x 6   Entered and Authorized by:   Shaune Leeks MD   Signed by:   Shaune Leeks MD on 01/11/2011   Method used:   Electronically to        [facility] [address] (retail)       [address]       [place], [place]  [address]       Ph: [phone]       Fax: [phone]   RxID:   [id number]

## 2011-01-27 NOTE — Assessment & Plan Note (Signed)
Summary: Probs with pain med.  Labs for thyroid/lsf   Vital Signs:  Patient profile:   55 year old female Height:      60 inches Weight:      123.25 pounds BMI:     24.16 Temp:     98.4 degrees F oral Pulse rate:   84 / minute Pulse rhythm:   regular BP sitting:   120 / 84  (left arm) Cuff size:   regular  Vitals Entered By: Delilah Shan CMA Duncan Dull) (August 31, 2010 11:06 AM) CC: Probs with pain med.  Labs for thyroid   History of Present Illness: Seeing patient for first time today .  H/o BAD on lithium followed at [facility] but thyroid med is through [facility].  Due for TSH.    Complicated h/o with back pain.  Prev eval at [facility] for pain earlier this year.  H/o gastroparesis and had episode of vomiting this past weekend.  Goal is to manage pain w/o delaying gastric transit time and/or causing constipation.  Prev seen by GI.  "I've been seen at [facility], too, for this and they did an MRI of my neck."  I have no records of this to review.  Pain in bilateral lower back, no radcular symptoms per patient.  No change in symptoms, but pain has continued.  Constant but with variation in severity.  Intolerant of nucynta with oral irritation.  Intolerant of mult other meds.  "I was told I had arthritis in my back."  S/p injection per available [facility] notes.  Pt had vomiting episode this weekend, resolved now, even w/o use of opiates like oxycodone.  She is asking for a percocet rx.  Off all pain meds for about 10 days per patient.    Allergies: 1)  ! Aspirin 2)  ! Codeine 3)  ! Prednisone 4)  ! Erythromycin 5)  ! * Antiinflammatories 6)  ! Reglan (Metoclopramide Hcl) 7)  ! Gabapentin (Gabapentin) 8)  ! Lyrica (Pregabalin) 9)  ! Baclofen (Baclofen) 10)  ! Lyrica (Pregabalin) 11)  ! * Nucynta  Past History:  Past Medical History: BAD/Depression Hypothyroidism back pain gastroparesis insomnia GERD migraines urge incontinence.   Review of Systems       See HPI.  Otherwise negative.     Physical Exam  General:  GEN: nad, alert and oriented HEENT: mucous membranes moist NECK: supple w/o LA, no TMG CV: rrr. PULM: ctab, no inc wob ABD: soft, +bs EXT: no edema SKIN: no acute rash  back with no midline pain.  tender to palpation in bilateral paraspinal areas- L spine.  pain with forward flexion and back ext.  SLR neg bilaterally, distally NV intact.    Impression & Recommendations:  Problem # 1:  HYPOTHYROIDISM (ICD-244.9) Contact with labs.  Her updated medication list for this problem includes:    Levoxyl 88 Mcg Tabs (Levothyroxine sodium) .Marland Kitchen... 1 daily by mouth  Orders: TLB-TSH (Thyroid Stimulating Hormone) (84443-TSH)  Problem # 2:  BACK PAIN, LUMBAR, CHRONIC  L4,L5 RADICULOP (ICD-724.2) >25 min spent with patient, at least half of which was spent on counseling re: back pain.  I am not comfortable rx'ing opiates in a patient with gastroparesis who has been vomiting even w/o opiate exposure.  I d/w patient at length [id number].  I need her old records.  I will review them and see what I can offer at that point.  She may need recheck at [facility] clinic for consideration of non-oral tx (ie, therapy, injection).  I did give her an rx for zofran to use as needed.   The following medications were removed from the medication list:    Nucynta 50 Mg Tabs (Tapentadol hcl) ..... One tab by mouth every 6 hours as needed for pain    Oxycodone-acetaminophen 5-325 Mg Tabs (Oxycodone-acetaminophen) .Marland Kitchen... Take 1 tablet by mouth every 4 hours as needed for pain. Her updated medication list for this problem includes:    Excedrin Migraine 250-250-65 Mg Tabs (Aspirin-acetaminophen-caffeine) .Marland Kitchen... As needed  Complete Medication List: 1)  Lorazepam 1 Mg Tabs (Lorazepam) .Marland Kitchen.. 1 twice a day by mouth 2)  Prilosec 20 Mg Cpdr (Omeprazole) .Marland Kitchen.. 1daily by mouth 3)  Levoxyl 88 Mcg Tabs (Levothyroxine sodium) .Marland Kitchen.. 1 daily by mouth 4)  Wellbutrin Xl 300 Mg Tb24 (Bupropion hcl) .... Take 1 tablet by  mouth once a day 5)  Black Cohosh 40 Mg Caps (Black cohosh) .... Take 1 tablet by mouth two times a day 6)  Ambien 10 Mg Tabs (Zolpidem tartrate) .Marland Kitchen.. 1 tab by mouth at bedtime 7)  Toviaz 4 Mg Xr24h-tab (Fesoterodine fumarate) .Marland Kitchen.. 1 by mouth once daily 8)  Zofran 8 Mg Tabs (Ondansetron hcl) .Marland Kitchen.. 1 by mouth once daily as needed 9)  Excedrin Migraine 250-250-65 Mg Tabs (Aspirin-acetaminophen-caffeine) .... As needed 10)  Lithium Carbonate 150 Mg Caps (Lithium carbonate) .... Take 3 capsules at bedtime 11)  Benadryl 25 Mg Tabs (Diphenhydramine hcl) .... Take 2 tablets by mouth prior to nucynta  Patient Instructions: 1)  I'll get the records from [facility] and [place] and then talk to you about options.  Prescriptions: ZOFRAN 8 MG TABS (ONDANSETRON HCL) 1 by mouth once daily as needed  #30 x 5   Entered and Authorized by:   Crawford Givens MD   Signed by:   Crawford Givens MD on 08/31/2010   Method used:   Electronically to        [facility] [address] (retail)       [address]       [place], [place]  [address]       Ph: [phone]       Fax: [phone]   RxID:   [id number] LEVOXYL 88 MCG TABS (LEVOTHYROXINE SODIUM) 1 DAILY BY MOUTH  #30 Tablet x 6   Entered and Authorized by:   Crawford Givens MD   Signed by:   Crawford Givens MD on 08/31/2010   Method used:   Electronically to        [facility] [address] (retail)       [address]       [place], [place]  [address]       Ph: [phone]       Fax: [phone]   RxID:   [id number]   Current Allergies (reviewed today): ! ASPIRIN ! CODEINE ! PREDNISONE ! ERYTHROMYCIN ! * ANTIINFLAMMATORIES ! REGLAN (METOCLOPRAMIDE HCL) ! GABAPENTIN (GABAPENTIN) ! LYRICA (PREGABALIN) ! BACLOFEN (BACLOFEN) ! LYRICA (PREGABALIN) ! * NUCYNTA

## 2011-01-27 NOTE — Assessment & Plan Note (Signed)
Summary: STOMACH PAIN,NO APPETITE/CLE   Vital Signs:  Patient profile:   55 year old female Weight:      128.75 pounds BMI:     25.24 Temp:     97.6 degrees F oral Pulse rate:   96 / minute Pulse rhythm:   regular BP sitting:   112 / 80  (left arm) Cuff size:   regular  Vitals Entered By: Sydell Axon LPN (April 12, 2010 11:43 AM) CC: abdominal pain, was vomitting and no appetite   History of Present Illness: Pt here for voitting since Sat afternoon which continued until [employer]. She was given neighbor's "nausea pills" under her tongue and took this (Ondansetron 8mg ) Mon AM and then Mon afternoon and the vomitting stopped. She has had fever to 102 and chills yesterday, she has had headache yesterday which is now gone after taking Excedrin Migraine. Her legs feel like jello. She has taken no meds since Fri except Thyroid, Omeprazole, Kohash and Lorazepam. Adalberto Ill is for hot flashes...has helped a lot and has been on it for ten years. She has not had Wellbutrin, Lithium or Ambien or Toviax since Fri.   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 1 Twice A Day By Mouth 2)  Prilosec 20 Mg Cpdr (Omeprazole) .Marland Kitchen.. 1daily By Mouth 3)  Levoxyl 88 Mcg Tabs (Levothyroxine Sodium) .Marland Kitchen.. 1 Daily By Mouth 4)  Aspir-Low 81 Mg Tbec (Aspirin) .Marland Kitchen.. 1 Daily By Mouth 5)  Lithium Carbonate 300 Mg  Tabs (Lithium Carbonate) .... 2 Tablets By Mouth At Bedtime 6)  Wellbutrin Xl 300 Mg  Tb24 (Bupropion Hcl) .... Take 1 Tablet By Mouth Once A Day 7)  Valtrex 500 Mg  Tabs (Valacyclovir Hcl) .... Take 1 Capsule By Mouth Two Times A Day 8)  Tramadol Hcl 50 Mg Tabs (Tramadol Hcl) .Marland Kitchen.. 1 Tablet Four Times A Day As Needed 9)  Promethazine Hcl 25 Mg Tabs (Promethazine Hcl) .... Take 1 Tablet Every 6 Hours As Needed For Nausea 10)  Black Cohosh 40 Mg Caps (Black Cohosh) .... Take 1 Tablet By Mouth Two Times A Day 11)  Ambien 10 Mg Tabs (Zolpidem Tartrate) .Marland Kitchen.. 1 Tab By Mouth At Bedtime 12)  Chantix 1 Mg Tabs (Varenicline Tartrate) .... One Tab By Mouth Two Times A Day 13)  Gabapentin 300 Mg Caps (Gabapentin) .... Take One By Mouth At Bedtime  Allergies: 1)  ! Aspirin 2)  ! Codeine 3)  ! Prednisone 4)  ! Erythromycin 5)  ! * Antiinflammatories 6)  ! Reglan (Metoclopramide Hcl)  Physical Exam  General:  Well-developed,well-nourished,in no acute distress; alert,appropriate and cooperative throughout examination, looks tired but nontoxic. Easily able to discuss case, normal gait. Head:  Normocephalic and atraumatic without obvious abnormalities. No apparent alopecia or balding. Sinuses NT. Eyes:  Conjunctiva clear bilaterally.  Ears:  External ear exam shows no significant lesions or deformities.  Otoscopic examination reveals clear canals, tympanic membranes are intact bilaterally without bulging, retraction, inflammation or discharge. Hearing is grossly normal bilaterally.PETs in place. Nose:  External nasal examination shows no  deformity or inflammation. Nasal mucosa are pink and moist without lesions or exudates. Mouth:  Oral mucosa and oropharynx without lesions or exudates.  Teeth in good repair. Extremely  dry mucosa. Neck:  No deformities, masses, or tenderness noted. Lungs:  Normal respiratory effort, chest expands symmetrically. Lungs are clear to auscultation, no crackles or wheezes. Heart:  Normal rate and regular rhythm. S1 and S2 normal without gallop, murmur, click, rub or other extra sounds. Abdomen:  Bowel sounds very active but nml sounds, abdomen soft and minimally tender globally since her vomitting without masses, organomegaly or hernias noted. Neurologic:  No cranial nerve deficits noted. Station and gait are normal.Sensory, motor and coordinative functions appear intact. Psych:  Cognition and judgment appear intact. Alert and cooperative with normal attention span and concentration. No apparent delusions, illusions, hallucinations   Impression & Recommendations:  Problem # 1:  GASTROENTERITIS (ICD-558.9) Assessment New Seems to be acute episode of AGE. Suggest clears today and BRAT tomm. Was not nauseous today so declined medication.  Pt is slightly to moderately dehydrated...suggested Gatorade or rehydration solution. If unable to keep down, to ER for IV therapy.  Complete Medication List: 1)  Lorazepam 1 Mg Tabs (Lorazepam) .Marland Kitchen.. 1 twice a day by mouth 2)  Prilosec 20 Mg Cpdr (Omeprazole) .Marland Kitchen.. 1daily by mouth 3)  Levoxyl 88 Mcg Tabs (Levothyroxine sodium) .Marland Kitchen.. 1 daily by mouth 4)  Aspir-low 81 Mg Tbec (Aspirin) .Marland Kitchen.. 1 daily by mouth 5)  Lithium Carbonate 300 Mg Tabs (Lithium carbonate) .... 2 tablets by mouth at bedtime 6)  Wellbutrin Xl 300 Mg Tb24 (Bupropion hcl) .... Take 1 tablet by mouth once a day 7)  Valtrex 500 Mg Tabs (Valacyclovir hcl) .... Take 1 capsule by mouth two times a day 8)  Tramadol Hcl 50 Mg Tabs (Tramadol hcl) .Marland Kitchen.. 1 tablet four times a day as needed 9)  Promethazine Hcl  25 Mg Tabs (Promethazine hcl) .... Take 1 tablet every 6 hours as needed for nausea 10)  Black Cohosh 40 Mg Caps (Black cohosh) .... Take 1 tablet by mouth two times a day 11)  Ambien 10 Mg Tabs (Zolpidem tartrate) .Marland Kitchen.. 1 tab by mouth at bedtime 12)  Chantix 1 Mg Tabs (Varenicline tartrate) .... One tab by mouth two times a day 13)  Gabapentin 300 Mg Caps (Gabapentin) .... Take one by mouth at bedtime  Patient Instructions: 1)  To ER if sxs worsen. 2)  25 mins spent with pt.   Current Allergies (reviewed today): ! ASPIRIN ! CODEINE ! PREDNISONE ! ERYTHROMYCIN ! * ANTIINFLAMMATORIES ! REGLAN (METOCLOPRAMIDE HCL)

## 2011-01-27 NOTE — Progress Notes (Signed)
Summary: Medication refill  Medications Added ZOFRAN ODT 4 MG TBDP (ONDANSETRON) 1 tab daily as needed       Phone Note Call from Patient Call back at Home Phone [phone]   Caller: Patient Call For: Dr. Christella Hartigan Reason for Call: Talk to Nurse Summary of Call: Needs refill on her Zofran...[facility] [phone] Initial call taken by: Karna Christmas,  January 14, 2011 10:48 AM  Follow-up for Phone Call        Dr Christella Hartigan, may we refill Zofran? Last refill 12 tabs on 10/22/10. Thanks, Graciella Freer RN  January 14, 2011 2:07 PM   Additional Follow-up for Phone Call Additional follow up Details #1::        yes, 30 pills, once daily.  no refills aftrer that, she needs rov  Additional Follow-up by: Rachael Fee MD,  January 14, 2011 2:32 PM    Additional Follow-up for Phone Call Additional follow up Details #2::    Notified patient that Dr Christella Hartigan approved for her to have 30 tabs with no refills, she will need a ROV to reorder. Patient stated understanding.  Follow-up by: Graciella Freer RN,  January 14, 2011 4:42 PM  New/Updated Medications: ZOFRAN ODT 4 MG TBDP (ONDANSETRON) 1 tab daily as needed Prescriptions: ZOFRAN ODT 4 MG TBDP (ONDANSETRON) 1 tab daily as needed  #30 x 0   Entered by:   Graciella Freer RN   Authorized by:   Rachael Fee MD   Signed by:   Graciella Freer RN on 01/14/2011   Method used:   Electronically to        [facility] [address] (retail)       [address]       [place], [place]  [address]       Ph: [phone]       Fax: [phone]   RxID:   [id number]

## 2011-01-27 NOTE — Progress Notes (Signed)
Summary: needs letter for jury duty  Phone Note Call from Patient Call back at [phone]   Caller: Patient Call For: Shaune Leeks MD Summary of Call: Pt is asking for a letter to excuse her from jury duty due to being unable to sit for longer than 30 minutes and also because she continues to have vomiting.  She is scheduled for duty on 11/28. Initial call taken by: Lowella Petties CMA, AAMA,  November 02, 2010 10:50 AM  Follow-up for Phone Call        Done. Follow-up by: Shaune Leeks MD,  November 03, 2010 7:43 AM  Additional Follow-up for Phone Call Additional follow up Details #1::        Advised pt letter is ready to pick up. Additional Follow-up by: Lowella Petties CMA, AAMA,  November 03, 2010 8:39 AM

## 2011-01-27 NOTE — Progress Notes (Signed)
Summary: needs pain medicine  Phone Note Call from Patient Call back at Home Phone [phone] Call back at [phone]   Caller: Patient Summary of Call: Pt states that she is in severe back pain and is asking that something be called to [facility] in [place].  Ultram, torodol does not help.  She needs something that she can take every day to control the pain.  She knows you are waiting on her records but she cant wait any longer.  Asks if you can call in some vicodin.  Please let pt know. Initial call taken by: Lowella Petties CMA,  September 06, 2010 10:20 AM  Follow-up for Phone Call        I didn't fill rx for vicodin or percocet on Friday because of her gastroparesis.  I am not comfortable prescribing this for this patient (due to potential side effects).  I am awaiting records.  I will review them when available.  In the meantime, I would have the patient contact [facility] where she had been seen earlier this year.  Follow-up by: Crawford Givens MD,  September 06, 2010 1:54 PM  Additional Follow-up for Phone Call Additional follow up Details #1::        Patient Advised.   Patient is crying and asking what can she do?  She needs something for pain until the records come and can be reviewed.  Patient says she threw up all weekend and had not had pain medication.  I reiterated that she could contact [facility] and she says she has left messages there with no return call for quite some time.  Lugene Fuquay CMA Duncan Dull)  September 06, 2010 3:57 PM     Additional Follow-up for Phone Call Additional follow up Details #2::    Again, if she is vomiting I would not start narcotics.  I have d/w patient in detail about this at the OV.  I will await recs to see what I can do.  In the meantime, I would have patient contact [facility] again.  Follow-up by: Crawford Givens MD,  September 06, 2010 5:30 PM  Additional Follow-up for Phone Call Additional follow up Details #3:: Details for Additional Follow-up Action Taken: Patient  Advised.  Lugene Fuquay CMA Duncan Dull)  September 06, 2010 5:35 PM    Appended Document: needs pain medicine Patient says she will find herself another physician.

## 2011-01-27 NOTE — Progress Notes (Signed)
Summary: psychiatrist requests phone call  Phone Note From Other Clinic   Caller: Dr. Manuela Neptune, [facility]     Summary of Call: Pt's psychiatrist called to update you on some med changes.  I told him that you are out of the office until next wednesday.  He said the best way to reach him is by calling his pager at [phone],  cell is [phone]. Initial call taken by: Lowella Petties CMA,  October 01, 2010 10:53 AM  Follow-up for Phone Call        Finally got hold of Dr Zola Button. He started Seroquel as an adjunct to her Bipolar D/O at 50mg  two times a day. He did not want to incr Lithium as it bothered her in higher doses. This dose of Seroqel made her sleepy, so he has backed down to 25 two times a day and told her that if she is still too sedated, just go to 25 at night. 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 Follow-up by: Shaune Leeks MD,  October 07, 2010 5:36 PM    New/Updated Medications: SEROQUEL 50 MG TABS (QUETIAPINE FUMARATE) 1/2 tab by mouth bid

## 2011-01-27 NOTE — Progress Notes (Signed)
Summary: Pain/Referral  Phone Note Call from Patient Call back at Home Phone [phone]   Caller: Patient Call For: Dr. Para March Summary of Call: Patient called back again upset about not being able to get her pain medication. Patient notified as instructed by Dr. Para March. Patient stated that she is not going back to the Neurologist at [facility] because the medication that he gave her almost killed her. Patient stated that she is in constant pain and  needs some help. After discussing this with patient I offered to see about getting her a referral to another neurologist for her problems. Patient stated that she would like to have a referral to a different neurologist. Initial call taken by: Sydell Axon LPN,  September 09, 2010 4:08 PM  Follow-up for Phone Call        referral to neuro done.  Follow-up by: Crawford Givens MD,  September 09, 2010 5:17 PM  Additional Follow-up for Phone Call Additional follow up Details #1::        Pt wants to go to neurologist at [facility].  Lowella Petties CMA  September 10, 2010 2:46 PM     Additional Follow-up for Phone Call Additional follow up Details #2::    Appt made with Dr Sherryll Burger on 10/26/2010 at 9:45. Follow-up by: Carlton Adam,  September 16, 2010 10:32 AM

## 2011-01-27 NOTE — Progress Notes (Signed)
  Phone Note Outgoing Call   Summary of Call: Please call patient.  I have reviewed records from [facility] (came in 09/08/10).  I would have the patient follow up with [facility] re: back pain.  I am uncomfortable rx'ing narcotics for the patient with her history of gastroparesis and tardive dyskinesia.  She needs specialty care for her back.   Initial call taken by: Crawford Givens MD,  September 09, 2010 8:19 AM  Follow-up for Phone Call        Patient Advised.   Patient says she has an appt. with Dr. Hetty Ely next month. Follow-up by: Delilah Shan CMA Myrella Fahs Dull),  September 09, 2010 9:27 AM

## 2011-01-27 NOTE — Progress Notes (Signed)
Summary: requests pain medicine  Phone Note Call from Patient Call back at [phone]   Caller: Patient Call For: Shaune Leeks MD Summary of Call: Pt was seen at the spine clinic in [place] last week.  She was found to have DDD and bone spurs.  She was given lyrica and neurontin.   These are not helping her pain.  That clinic will not return her call until  tomorrow.  She has an appt with you on thursday.  She is asking if we can call her in something for the pain.  [facility] [place].  She says torodol and ultram will not do her anygood and she cant take NSAIDS because she takes lithium. Initial call taken by: Lowella Petties CMA,  May 03, 2010 3:06 PM  Follow-up for Phone Call        Will prescribe very small amt of Vicodin. She is to get pain control meds from her dostor taking care of the problem. Follow-up by: Shaune Leeks MD,  May 03, 2010 4:50 PM  Additional Follow-up for Phone Call Additional follow up Details #1::        [facility] [place].  Pt didnt answer her  phone, and there is no voice mail set up. Additional Follow-up by: Lowella Petties CMA,  May 03, 2010 5:07 PM    New/Updated Medications: VICODIN 5-500 MG TABS (HYDROCODONE-ACETAMINOPHEN) one tab by mouth two times a day as needed back pain. Prescriptions: VICODIN 5-500 MG TABS (HYDROCODONE-ACETAMINOPHEN) one tab by mouth two times a day as needed back pain.  #10 x 0   Entered and Authorized by:   Shaune Leeks MD   Signed by:   Lowella Petties CMA on 05/03/2010   Method used:   Telephoned to ...       [facility] [address] (retail)       [address]       [place], [place]  [address]       Ph: [phone]       Fax: [phone]   RxID:   [id number]

## 2011-01-27 NOTE — Progress Notes (Signed)
  Phone Note From Other Clinic   Summary of Call: Call from Dr Darcey Nora, [facility], concerning Leslie Wong. She was seen yesterday for Bipolar disease poorly controlled since the pt stopped her Lithium several months ago. 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 I gave him as much information as possible. He will get in touch with Dr Christella Hartigan to have pt reevaled for nausea. Initial call taken by: Shaune Leeks MD,  June 01, 2010 6:42 PM

## 2011-01-27 NOTE — Assessment & Plan Note (Signed)
Review of gastrointestinal problems: 1.  Gastroparesis confirmed by GES March, 2007.  Side effects from reglan, patient did not follow up for 3 years after I made this diagnosis (she did not show for appts).  EGD in [place], 2007 (small HH, otherwise normal), was put on reglan in [place]..  Regan caused side effects and she stopped 2010.  History of Present Illness Visit Type: Follow-up Visit Primary GI MD: Rob Bunting MD Primary Provider: Laurita Quint, MD Chief Complaint: Nausea and vomiting History of Present Illness:     55 year old woman whom I last saw about a year ago.  At that visit we discussed her eating smaller more frequent meals.  She has not been doing that. She generally eats one meal a day.  She is bothered  by nausea/vomitting intermittently...she brought her calender with her.  IN april she had several vomiting episodes, three days.    She started   she takes valtrex about 6 times a week.  She is off of all narcotic pain medicines.  she says PR phenergan makes her nausea worse.           Current Medications (verified): 1)  Lorazepam 1 Mg Tabs (Lorazepam) .Marland Kitchen.. 1 Twice A Day By Mouth 2)  Prilosec 20 Mg Cpdr (Omeprazole) .Marland Kitchen.. 1daily By Mouth 3)  Levoxyl 88 Mcg Tabs (Levothyroxine Sodium) .Marland Kitchen.. 1 Daily By Mouth 4)  Wellbutrin Xl 300 Mg  Tb24 (Bupropion Hcl) .... Take 1 Tablet By Mouth Once A Day 5)  Valtrex 500 Mg  Tabs (Valacyclovir Hcl) .... Take 1 Capsule By Mouth Two Times A Day 6)  Black Cohosh 40 Mg Caps (Black Cohosh) .... Take 1 Tablet By Mouth Two Times A Day 7)  Ambien 10 Mg Tabs (Zolpidem Tartrate) .Marland Kitchen.. 1 Tab By Mouth At Bedtime 8)  Nucynta 50 Mg Tabs (Tapentadol Hcl) .... One Tab By Mouth Every 6 Hours As Needed For Pain 9)  Depakote Er 250 Mg Xr24h-Tab (Divalproex Sodium) .Marland Kitchen.. 1 By Mouth Two Times A Day 10)  Black Cohosh 40 Mg Caps (Black Cohosh) .Marland Kitchen.. 1 By Mouth Two Times A Day 11)  Toviaz 4 Mg Xr24h-Tab (Fesoterodine Fumarate) .Marland Kitchen.. 1 By Mouth  Once Daily  Allergies: 1)  ! Aspirin 2)  ! Codeine 3)  ! Prednisone 4)  ! Erythromycin 5)  ! * Antiinflammatories 6)  ! Reglan (Metoclopramide Hcl) 7)  ! Gabapentin (Gabapentin) 8)  ! Lyrica (Pregabalin) 9)  ! Baclofen (Baclofen) 10)  ! Lyrica (Pregabalin)  Vital Signs:  Patient profile:   55 year old female Height:      60 inches Weight:      126 pounds BMI:     24.70 BSA:     1.54 Pulse rate:   102 / minute Pulse rhythm:   regular BP sitting:   100 / 68  (left arm)  Vitals Entered By: Merri Ray CMA Duncan Dull) (June 15, 2010 2:20 PM)  Physical Exam  Additional Exam:  Constitutional: generally well appearing Psychiatric: alert and oriented times 3 Abdomen: soft, non-tender, non-distended, normal bowel sounds    Impression & Recommendations:  Problem # 1:  intermittent nausea vomiting; well-documented gastroparesis she has gastroparesis on 2007 gastric emptying scan. She takes several medicines but had nausea or vomiting as a number one or 2 side effect. She is intermittently on narcotic pain medicines as well aerated I recommended for gastroparesis, again, that she started eating 4-5 small meals a day rather than one meal a day which has been her  habit for many years at least. This allows a stomach that functions poorly to have less demand put on. I do not think that she would meet criteria for cyclic vomiting syndrome given her documented gastroparesis and fact that she is on several medicines that can cause nausea, vomiting. She asked if I get a Depakote level and I am happy to do that and will forward records to her psychiatrist.  Other Orders: T- * Misc. Laboratory test [id number]) T-Valproic Acid (Depakene) [phone])  Patient Instructions: 1)  You are on several medicines that can cause/contribute to nausea.  Chantix, valtrex, nucynta, depakote all have nausea/vomiting listed as a number 1 or 2 side effect. 2)  Your stomach empties slowly, this was proven by  radiology test 4 years ago.  You should eat 4-5 small meals a day rather than one meal a day. 3)  Zofran prescripation given. Take one pill every morning if you are nauseas. 4)  We will check a depakote level today, as you requested, and will send the results to Dr. Manuela Neptune at [facility] psychiatry. 5)  The medication list was reviewed and reconciled.  All changed / newly prescribed medications were explained.  A complete medication list was provided to the patient / caregiver. Prescriptions: ZOFRAN 4 MG  TABS (ONDANSETRON HCL) take one pill every morning for nausea as needed  #30 x 11   Entered and Authorized by:   Rachael Fee MD   Signed by:   Rachael Fee MD on 06/15/2010   Method used:   Electronically to        [facility] [address] (retail)       [address]       [place], [place]  [address]       Ph: [phone]       Fax: [phone]   RxID:   [id number]   Appended Document:  can you send a copy to Dr. Manuela Neptune at [facility] psychiatry  Appended Document:  sent to transcription

## 2011-01-27 NOTE — Progress Notes (Signed)
Summary: Rx Levoxyl  Phone Note Refill Request Call back at [phone] Message from:  [facility] [id number] on August 26, 2010 9:54 AM  Refills Requested: Medication #1:  LEVOXYL 88 MCG TABS 1 DAILY BY MOUTH Patient has not had lab work to check for thyroid recently.    Method Requested: Electronic Initial call taken by: Sydell Axon LPN,  August 26, 2010 9:59 AM  Follow-up for Phone Call        fill for 30days, no rf. Needs recheck TSH.  dx 244.9 Follow-up by: Crawford Givens MD,  August 26, 2010 12:57 PM  Additional Follow-up for Phone Call Additional follow up Details #1::        Appointment scheduled  08/31/2010 at 10:45 a.m.  to see MD about pain med probs.  Will get labs for thyroid at that time.   Additional Follow-up by: Delilah Shan CMA (AAMA),  August 26, 2010 2:48 PM    Prescriptions: LEVOXYL 88 MCG TABS (LEVOTHYROXINE SODIUM) 1 DAILY BY MOUTH  #30 Tablet x 0   Entered by:   Delilah Shan CMA (AAMA)   Authorized by:   Crawford Givens MD   Signed by:   Delilah Shan CMA (AAMA) on 08/26/2010   Method used:   Electronically to        [facility] [address] (retail)       [address]       [place], [place]  [address]       Ph: [phone]       Fax: [phone]   RxID:   [id number]

## 2011-01-27 NOTE — Progress Notes (Signed)
Summary: has stye  Phone Note Call from Patient Call back at Home Phone [phone]   Caller: Patient Call For: Leslie Leeks MD Summary of Call: Pt states she has a stye on her left eye that has been there all week.  She has been using warm compresses all week and it is not helping.  She is asking if you can call her in something to [facility] [place].  Do you  want to work her in today?  She refuses to see anyone else here. Initial call taken by: Lowella Petties CMA,  October 07, 2010 3:16 PM  Follow-up for Phone Call        Please have pt be seen by [facility]. Referral done. Follow-up by: Leslie Leeks MD,  October 07, 2010 3:38 PM  Additional Follow-up for Phone Call Additional follow up Details #1::        Advised pt. Additional Follow-up by: Lowella Petties CMA,  October 07, 2010 4:29 PM  New Problems: STYE (ICD-373.11)   New Problems: STYE (ICD-373.11)

## 2011-01-27 NOTE — Assessment & Plan Note (Signed)
Summary: 6 WEEK FOLLOW UP RECHECK/[facility]   Vital Signs:  Patient profile:   55 year old female Weight:      128.75 pounds Temp:     99.2 degrees F oral Pulse rate:   92 / minute Pulse rhythm:   regular BP sitting:   120 / 78  (left arm) Cuff size:   regular  Vitals Entered By: Sydell Axon LPN (May 06, 2010 3:28 PM) CC: 6 Week follow-up, went to Neurologist at [facility] and was given medications that she had an allergic reaction, went to [facility]   History of Present Illness: Pt here for followup from having had elevated WBC and Ca. She has since been to the ER for AGE and then seen here. I finally prescribed Zofran SL which helped her and she now feels back to normal GI wise. Her problem today id continued back pain. She has seen neurology and back clinic for thios problem w/o much help. She was prescribed Lyrica and the Neurontin and had signif reaction to the Gabapentin that took her back to the ER, this time for anaphylaxis and progressing SOB. She was succesffully treated but is left with back pain. She has been given Vicodin in the past which dulls but does not relieve the [pain. She has had MRI and plain films which she was told showed DDD (which she has had for many years) and bone spurs. She would like something to better help her pain.  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 1 Twice A Day By Mouth 2)  Prilosec 20 Mg Cpdr (Omeprazole) .Marland Kitchen.. 1daily By Mouth 3)  Levoxyl 88 Mcg Tabs (Levothyroxine Sodium) .Marland Kitchen.. 1 Daily By Mouth 4)  Aspir-Low 81 Mg Tbec (Aspirin) .Marland Kitchen.. 1 Daily By Mouth 5)  Lithium Carbonate 300 Mg  Tabs (Lithium Carbonate) .... 2 Tablets By Mouth At Bedtime 6)  Wellbutrin Xl 300 Mg  Tb24 (Bupropion Hcl) .... Take 1 Tablet By Mouth Once A Day 7)  Valtrex 500 Mg  Tabs (Valacyclovir Hcl) .... Take 1 Capsule By Mouth Two Times A Day 8)  Tramadol Hcl 50 Mg Tabs (Tramadol Hcl) .Marland Kitchen.. 1 Tablet Four Times A Day As Needed 9)  Promethazine Hcl 25 Mg Tabs (Promethazine Hcl) .... Take 1 Tablet Every 6 Hours As Needed For Nausea 10)  Black Cohosh 40 Mg Caps (Black Cohosh) .... Take 1 Tablet By Mouth Two Times A Day 11)  Ambien 10 Mg Tabs (Zolpidem Tartrate) .Marland Kitchen.. 1 Tab By Mouth At Bedtime 12)  Chantix 1 Mg Tabs (Varenicline Tartrate) .... One Tab By Mouth Two Times A Day 13)  Gabapentin 300 Mg Caps (Gabapentin) .... Take One By Mouth At Bedtime 14)  Vicodin 5-500 Mg Tabs (Hydrocodone-Acetaminophen) .... One Tab By Mouth Two Times A Day As Needed Back Pain.  Allergies: 1)  ! Aspirin 2)  ! Codeine 3)  ! Prednisone 4)  ! Erythromycin 5)  ! * Antiinflammatories 6)  ! Reglan (Metoclopramide Hcl) 7)  ! Gabapentin (Gabapentin) 8)  ! Lyrica (Pregabalin) 9)  ! Baclofen (Baclofen)  Physical Exam  General:  Well-developed,well-nourished,in no acute distress; alert,appropriate and cooperative throughout examination.  Head:  Normocephalic and atraumatic without obvious  abnormalities. No apparent alopecia or balding. Sinuses NT. Eyes:  Conjunctiva clear bilaterally.  Ears:  External ear exam shows no significant lesions or deformities.  Otoscopic examination reveals clear canals, tympanic membranes are intact bilaterally without bulging, retraction, inflammation or discharge. Hearing is grossly normal bilaterally.PETs in place. Nose:  External nasal examination shows no deformity or inflammation. Nasal mucosa are pink and moist without lesions or exudates. Mouth:  Oral mucosa and oropharynx without lesions or exudates.  Teeth in good repair. Extremely  dry mucosa. Neck:  No deformities, masses, or tenderness noted. Lungs:  Normal respiratory effort, chest expands symmetrically. Lungs are clear to auscultation, no crackles or wheezes. Heart:  Normal rate and regular rhythm. S1 and S2 normal without gallop, murmur, click, rub or other extra sounds. Abdomen:  Bowel sounds very active but nml sounds, abdomen soft and minimally tender globally since her vomitting without masses, organomegaly or hernias noted. Msk:  No deformity or scoliosis noted of thoracic or lumbar spine.  ROM of back limited and has difficulty laying down or getting up from exam table.   Impression & Recommendations:  Problem # 1:  GASTROENTERITIS (ICD-558.9) Assessment Improved Resolved.  Problem # 2:  HYPERCALCEMIA (ICD-275.42) Ca 10.2 (Nml) from [facility] drawn last week.  Problem # 3:  LEUKOCYTOSIS (ICD-288.60) WBC down to 10.4 (Nml) from [facility]. Pt has been off her Lithium for the last week.  Problem # 4:  BACK PAIN, LUMBAR, CHRONIC  L4,L5 RADICULOP (ICD-724.2) Assessment: Unchanged Continued discomfort. Will try the new Nucynta at low dose and see how she does. She is aware of this through a neighbor who takes it successfully. Her updated medication list for this problem includes:    Aspir-low 81 Mg Tbec (Aspirin) .Marland Kitchen... 1 daily by mouth    Tramadol Hcl 50 Mg Tabs (Tramadol hcl) .Marland Kitchen... 1  tablet four times a day as needed    Vicodin 5-500 Mg Tabs (Hydrocodone-acetaminophen) ..... One tab by mouth two times a day as needed back pain.    Oxycodone-acetaminophen 5-325 Mg Tabs (Oxycodone-acetaminophen) .Marland Kitchen... Take one by mouth every 4 hours as needed    Nucynta 50 Mg Tabs (Tapentadol hcl) ..... One tab by mouth every 6 hours as needed for pain  Complete Medication List: 1)  Lorazepam 1 Mg Tabs (Lorazepam) .Marland Kitchen.. 1 twice a day by mouth 2)  Prilosec 20 Mg Cpdr (Omeprazole) .Marland Kitchen.. 1daily by mouth 3)  Levoxyl 88 Mcg Tabs (Levothyroxine sodium) .Marland Kitchen.. 1 daily by mouth 4)  Aspir-low 81 Mg Tbec (Aspirin) .Marland Kitchen.. 1 daily by mouth 5)  Lithium Carbonate 300 Mg Tabs (Lithium carbonate) .... 2 tablets by mouth at bedtime 6)  Wellbutrin Xl 300 Mg Tb24 (Bupropion hcl) .... Take 1 tablet by mouth once a day 7)  Valtrex 500 Mg Tabs (Valacyclovir hcl) .... Take 1 capsule by  mouth two times a day 8)  Tramadol Hcl 50 Mg Tabs (Tramadol hcl) .Marland Kitchen.. 1 tablet four times a day as needed 9)  Promethazine Hcl 25 Mg Tabs (Promethazine hcl) .... Take 1 tablet every 6 hours as needed for nausea 10)  Black Cohosh 40 Mg Caps (Black cohosh) .... Take 1 tablet by mouth two times a day 11)  Ambien 10 Mg Tabs (Zolpidem tartrate) .Marland Kitchen.. 1 tab by mouth at bedtime 12)  Chantix 1 Mg Tabs (Varenicline tartrate) .... One tab by mouth two times a day 13)  Vicodin 5-500 Mg Tabs (Hydrocodone-acetaminophen) .... One tab by mouth two times a day as needed back pain. 14)  Oxycodone-acetaminophen 5-325 Mg Tabs (Oxycodone-acetaminophen) .... Take one by mouth every 4 hours as needed 15)  Nucynta 50 Mg Tabs (Tapentadol hcl) .... One tab by mouth every 6 hours as needed for pain  Patient Instructions: 1)  RTC as needed. Prescriptions: NUCYNTA 50 MG TABS (TAPENTADOL HCL) one tab by mouth every 6 hours as needed for pain  #30 x 0   Entered and Authorized by:   Shaune Leeks MD   Signed by:   Shaune Leeks MD on 05/06/2010    Method used:   Print then Give to Patient   RxID:   [id number]   Current Allergies (reviewed today): ! ASPIRIN ! CODEINE ! PREDNISONE ! ERYTHROMYCIN ! * ANTIINFLAMMATORIES ! REGLAN (METOCLOPRAMIDE HCL) ! GABAPENTIN (GABAPENTIN) ! LYRICA (PREGABALIN) ! BACLOFEN (BACLOFEN)

## 2011-01-27 NOTE — Assessment & Plan Note (Signed)
Summary: ROA FOR FOLLOW-UP AND TALK ABOUT PAIN MEDS/JRR   Vital Signs:  Patient profile:   55 year old female Weight:      123 pounds Temp:     99.0 degrees F oral Pulse rate:   84 / minute Pulse rhythm:   regular BP sitting:   120 / 68  (left arm) Cuff size:   regular  Vitals Entered By: Sydell Axon LPN (September 29, 2010 10:36 AM) CC: Follow-up and to talk about pain medication   History of Present Illness: Pt here to discuss pain. She has continued to have problems with nausea...it happens most recently after taking meds at night. She had been on Toviaz for bladder leakage and had from that  tongue swelling and then was put on Gelnique topical...uses once a day and hasn't had any problems for the last six months. She puts this on in the morning. She takes Lithium, a sleeping pill (Ambien) and occas pain pill at night. She also takes nausea  pills frequently. She last saw Dr Christella Hartigan for her stomach and had U/S which was reportedly nml. She was told to eat small portions multiple times a day. She has difficulty with that because she doesn't eat that much. She thinks the Gala Murdoch was making her nauseated, not the Nycinta. The Nucynta has been the only thing that has helped her pain. She had called me for pain medication while I had been gone from the office the last three months. I called in Vicodin as a stop gap until able to see her in the office. THe Vicodin does not help as well and requires two a day and does not control pain whereas on Nycinta one a day controls the pain without making her feel abnornal. She has an appt with Neurology on Nov 1st to look into the reason for her pain. She has upper neck pain and lower right hip pain that sounds like radiculopathy.  She has had her flu shot a few days ago. .  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) 9)  Stye, L Lower Lid  (ICD-373.11) 10)  Weight Gain  (ICD-783.1) 11)  Carpal Tunnel Syndrome, Bilateral  (ICD-354.0) 12)  Shoulder Pain, Bilateral  (ICD-719.41) 13)  Hx of Urethral Stricture  (ICD-598.9) 14)  Gastroparesis  (ICD-536.3) 15)  Hypothyroidism  (ICD-244.9) 16)  Irritable Bowel Syndrome  (ICD-564.1) 17)  Disorder, Bipolar Nos  (ICD-296.80) 18)  Migraine Headache  (ICD-346.90) 19)  Fibrocystic Breast Disease  (ICD-610.1) 20)  Endometriosis S/p Hysterectomy  (ICD-617.9) 21)  Back Pain, Lumbar, Chronic L4,l5 Radiculop  (ICD-724.2) 22)  Myositis  (ICD-729.1) 23)  Hiatal Hernia With Reflux  (ICD-553.3)  Medications Prior to Update: 1)  Lorazepam 1 Mg Tabs (Lorazepam) .Marland Kitchen.. 1 Twice A Day By Mouth 2)  Prilosec 20 Mg Cpdr (Omeprazole) .Marland Kitchen.. 1daily By Mouth 3)  Levoxyl 88 Mcg Tabs (Levothyroxine Sodium) .Marland Kitchen.. 1 Daily By Mouth 4)  Wellbutrin Xl 300 Mg  Tb24 (Bupropion Hcl) .... Take 1 Tablet By Mouth Once A Day 5)  Black Cohosh 40 Mg Caps (Black Cohosh) .... Take 1 Tablet By Mouth Two Times A Day 6)  Ambien 10 Mg Tabs (Zolpidem Tartrate) .Marland Kitchen.. 1 Tab By Mouth At  Bedtime 7)  Toviaz 4 Mg Xr24h-Tab (Fesoterodine Fumarate) .Marland Kitchen.. 1 By Mouth Once Daily 8)  Zofran 8 Mg Tabs (Ondansetron Hcl) .Marland Kitchen.. 1 By Mouth Once Daily As Needed 9)  Excedrin Migraine 250-250-65 Mg Tabs (Aspirin-Acetaminophen-Caffeine) .... As Needed 10)  Lithium Carbonate 150 Mg Caps (Lithium Carbonate) .... Take 3 Capsules At Bedtime 11)  Benadryl 25 Mg Tabs (Diphenhydramine Hcl) .... Take 2 Tablets By Mouth Prior To Nucynta  Allergies: 1)  ! Aspirin 2)  ! Codeine 3)  ! Prednisone 4)  ! Erythromycin 5)  ! * Antiinflammatories 6)  ! Reglan (Metoclopramide Hcl) 7)  ! Gabapentin (Gabapentin) 8)  ! Lyrica (Pregabalin) 9)  ! Baclofen (Baclofen) 10)  ! Lyrica (Pregabalin) 11)  ! *  Nucynta  Physical Exam  General:  Well-developed,well-nourished,in no acute distress; alert,appropriate and cooperative throughout examination with slightly flat affect. Head:  Normocephalic and atraumatic without obvious abnormalities. No apparent alopecia or balding. Sinuses NT. Eyes:  Conjunctiva clear bilaterally.  Ears:  External ear exam shows no significant lesions or deformities.  Otoscopic examination reveals clear canals, tympanic membranes are intact bilaterally without bulging, retraction, inflammation or discharge. Hearing is grossly normal bilaterally.PETs in place. Nose:  External nasal examination shows no deformity or inflammation. Nasal mucosa are pink and moist without lesions or exudates. Mouth:  Oral mucosa and oropharynx without lesions or exudates.  Teeth in good repair. Extremely  dry mucosa. Neck:  No deformities, masses, or tenderness noted. Lungs:  Normal respiratory effort, chest expands symmetrically. Lungs are clear to auscultation, no crackles or wheezes. Heart:  Normal rate and regular rhythm. S1 and S2 normal without gallop, murmur, click, rub or other extra sounds. Abdomen:  Bowel sounds very active but nml sounds, abdomen soft and minimally tender globally since her vomitting without masses, organomegaly or hernias noted. Msk:  No deformity or scoliosis noted of thoracic or lumbar spine.  ROM of back limited and has difficulty laying down or getting up from exam table. Describes R upper leg radiculopathy.   Impression & Recommendations:  Problem # 1:  BACK PAIN, LUMBAR, CHRONIC  L4,L5 RADICULOP (ICD-724.2) Will prescribe Nucynta for one month until she sees the neurologist early next month. Script for 30 given. Pt does not abuse medications. Her updated medication list for this problem includes:    Excedrin Migraine 250-250-65 Mg Tabs (Aspirin-acetaminophen-caffeine) .Marland Kitchen... As needed    Nucynta 50 Mg Tabs (Tapentadol hcl) ..... One tab by mouth once a  day  Problem # 2:  NAUSEA WITH VOMITING (ICD-787.01) Assessment: Unchanged Am not sure what medication,if any, is causing her N and V.  Will give trial of new pain med and reassess thereafter. If N and V continue, back to GI for reassessment.  Complete Medication List: 1)  Lorazepam 1 Mg Tabs (Lorazepam) .Marland Kitchen.. 1 twice a day by mouth 2)  Prilosec 20 Mg Cpdr (Omeprazole) .Marland Kitchen.. 1daily by mouth 3)  Levoxyl 88 Mcg Tabs (Levothyroxine sodium) .Marland Kitchen.. 1 daily by mouth 4)  Wellbutrin Xl 300 Mg Tb24 (Bupropion hcl) .... Take 1 tablet by mouth once a day 5)  Black Cohosh 40 Mg Caps (Black cohosh) .... Take 1 tablet by mouth two times a day 6)  Ambien 10 Mg Tabs (Zolpidem tartrate) .Marland Kitchen.. 1 tab by mouth at bedtime 7)  Zofran 8 Mg Tabs (Ondansetron hcl) .Marland Kitchen.. 1 by mouth once daily as needed 8)  Excedrin Migraine 250-250-65 Mg Tabs (Aspirin-acetaminophen-caffeine) .... As needed 9)  Lithium Carbonate 150 Mg Caps (Lithium carbonate) .... Take  3 capsules at bedtime 10)  Benadryl 25 Mg Tabs (Diphenhydramine hcl) .... Take 2 tablets by mouth prior to nucynta 11)  Gelnique 10 % Gel (Oxybutynin chloride) .... Apply to skin once daily as needed 12)  Nucynta 50 Mg Tabs (Tapentadol hcl) .... One tab by mouth once a day Prescriptions: NUCYNTA 50 MG TABS (TAPENTADOL HCL) one tab by mouth once a day  #30 x 0   Entered and Authorized by:   Shaune Leeks MD   Signed by:   Shaune Leeks MD on 09/29/2010   Method used:   Print then Give to Patient   RxID:   [id number]   Current Allergies (reviewed today): ! ASPIRIN ! CODEINE ! PREDNISONE ! ERYTHROMYCIN ! * ANTIINFLAMMATORIES ! REGLAN (METOCLOPRAMIDE HCL) ! GABAPENTIN (GABAPENTIN) ! LYRICA (PREGABALIN) ! BACLOFEN (BACLOFEN) ! LYRICA (PREGABALIN) ! * NUCYNTA   Influenza Immunization History:    Influenza # 1:  Historical (09/17/2010)  Had flu shot at [facility] per patient.  Appended Document: ROA FOR FOLLOW-UP AND TALK ABOUT PAIN  MEDS/JRR     Clinical Lists Changes  Observations: Added new observation of PAST SURG HX: Appy  T/A  Laparoscopy for Endometriosis      Unilateral Ovarian Cystectomy (1990) MRI Neck DJD mult lvls Mild disc bulging C3-C7 (10/1994) MRI L/S Chronic disc protrusion L4-5, lateralization to L (10/1994) NCS/EMG RUE  C/W Mild R Carpal Tunnel (12/15/1994) HIDA- normal (09/1996) Abd [name]- normal (09/1996) IVP- normal (09/1996) Bone scan- normal (12/1996) MRI- DJB L4-5 with disk bulge (12/1996) NCS/EMG LLE - normal  [facility] Dr Kemper Durie (05/1997) Total Abdominal Hysterectomy (1999) MRI L/S-  annular tear L3-4 L4-5, neg disk herniaton (08/2003) EGD- neg except small hiatal hernia (01/2006) abnormal mammo--more views 12/03/07--neg  (10/28/2010 13:31)       Past Surgical History:    Appy  T/A     Laparoscopy for Endometriosis      Unilateral Ovarian Cystectomy (1990)    MRI Neck DJD mult lvls Mild disc bulging C3-C7 (10/1994)    MRI L/S Chronic disc protrusion L4-5, lateralization to L (10/1994)    NCS/EMG RUE  C/W Mild R Carpal Tunnel (12/15/1994)    HIDA- normal (09/1996)    Abd [name]- normal (09/1996)    IVP- normal (09/1996)    Bone scan- normal (12/1996)    MRI- DJB L4-5 with disk bulge (12/1996)    NCS/EMG LLE - normal  [facility] Dr Kemper Durie (05/1997)    Total Abdominal Hysterectomy (1999)    MRI L/S-  annular tear L3-4 L4-5, neg disk herniaton (08/2003)    EGD- neg except small hiatal hernia (01/2006)    abnormal mammo--more views 12/03/07--neg

## 2011-01-27 NOTE — Progress Notes (Signed)
Summary: Nucynta Rx.  Phone Note Call from Patient   Caller: Daughter, Dessa Phi [phone] Call For: Shaune Leeks MD Summary of Call: Patient is asking if she can have another written Rx. for Nucynta 50 mg.  Pharmacy advised them that this Rx. has to be written and brought in.  Please call patient when prescription is ready for pickup. [phone] Initial call taken by: Delilah Shan CMA Duncan Dull),  June 02, 2010 2:29 PM  Follow-up for Phone Call        Patient Advised. Prescription left at front desk.  Follow-up by: Delilah Shan CMA Duncan Dull),  June 02, 2010 3:23 PM    Prescriptions: NUCYNTA 50 MG TABS (TAPENTADOL HCL) one tab by mouth every 6 hours as needed for pain  #30 x 0   Entered and Authorized by:   Shaune Leeks MD   Signed by:   Shaune Leeks MD on 06/02/2010   Method used:   Print then Give to Patient   RxID:   [id number]

## 2011-01-27 NOTE — Medication Information (Signed)
Summary: [employer] Prior Auth Approval for Zofran-Ondansetron HCI Tab 4   [employer] Prior Auth Approval for Zofran-Ondansetron HCI Tab 4 mg from 04/30/10-12/25/38   Imported By: Beau Fanny 05/03/2010 10:13:55  _____________________________________________________________________  External Attachment:    Type:   Image     Comment:   External Document

## 2011-01-27 NOTE — Letter (Signed)
Summary: [facility],Note  Celine Mans A[facility],Note   Imported By: Beau Fanny 04/30/2010 13:58:43  _____________________________________________________________________  External Attachment:    Type:   Image     Comment:   External Document

## 2011-01-27 NOTE — Letter (Signed)
Summary: [facility],Note  Dr[facility],Note   Imported By: Beau Fanny 05/03/2010 15:29:23  _____________________________________________________________________  External Attachment:    Type:   Image     Comment:   External Document

## 2011-01-27 NOTE — Progress Notes (Signed)
Summary: Prior Authorization Zofran  Phone Note From Pharmacy Call back at ph [phone] fax [phone]   [facility]er: [facility] [phone] Call For: Dr. Hetty Ely  Summary of Call: Received fax form from pharmacy stating that PA is needed for Zofran 4mg .  Called [phone] and requested a fax form through the automated system.  Patient received this medication while in the hospital and she called our office to see if she could get Dr. Hetty Ely to obtain PA.  Please advise.  Form in your IN box.   Initial call taken by: Linde Gillis CMA Duncan Dull),  Apr 29, 2010 12:00 PM  Follow-up for Phone Call        Please get the hosp record. Follow-up by: Shaune Leeks MD,  Apr 29, 2010 1:37 PM  Additional Follow-up for Phone Call Additional follow up Details #1::        Records requested.  Linde Gillis CMA Duncan Dull)  Apr 29, 2010 2:20 PM   Records scanned into EMR by [facility].   Additional Follow-up by: Linde Gillis CMA Duncan Dull),  Apr 29, 2010 4:35 PM    Additional Follow-up for Phone Call Additional follow up Details #2::    Done. Shaune Leeks MD  Apr 29, 2010 5:09 PM   Form faxed to [employer] by [place] on 04/29/2010 at 5:12.   Follow-up by: Linde Gillis CMA Duncan Dull),  Apr 30, 2010 8:11 AM   Appended Document: Prior Authorization Zofran Received PA Approval for Zofran 4mg .  Approval valid from 04/30/2010 through 12/25/2038.  Letter sent to be scanned into EMR.

## 2011-01-27 NOTE — Letter (Signed)
Summary: Dr.Paul Juengel,[place],Note  Dr.Paul Juengel,[place] [facility],Note   Imported By: Beau Fanny 01/07/2011 16:45:27  _____________________________________________________________________  External Attachment:    Type:   Image     Comment:   External Document

## 2011-02-09 ENCOUNTER — Telehealth: Payer: Self-pay | Admitting: Family Medicine

## 2011-02-16 NOTE — Progress Notes (Signed)
Summary: Rx Chantix  Phone Note From Pharmacy Call back at [phone]   [facility]er[facility] [phone] Call For: Dr. Hetty Ely  Summary of Call: Received faxed refill request from pharmacy stating that patient was once on Chanitx and it worked well for her.  She is requesting that she get this again.  Last OV 09/2010.  Please advise. Initial call taken by: Linde Gillis CMA Duncan Dull),  February 09, 2011 3:14 PM  Follow-up for Phone Call        She is now on some significant psychiatric medications. Pls have her check with her psychiatrist  concerning possible  interactions of Chantix with any medication she may be on (Lorazepam, Wellbutrin, Lithium, Seroquel, Ambien, Zofran and Nucynta).  Follow-up by: Shaune Leeks MD,  February 09, 2011 5:15 PM  Additional Follow-up for Phone Call Additional follow up Details #1::        No answer at home number., will try again later. Sydell Axon LPN  February 10, 2011 8:42 AM  Patient notified as instructed by telephone. Sydell Axon LPN  February 10, 2011 10:13 AM  Pharmacy ([facility] notified as instructed by telephone. Additional Follow-up by: Sydell Axon LPN,  February 10, 2011 11:49 AM

## 2011-05-12 ENCOUNTER — Other Ambulatory Visit: Payer: Self-pay | Admitting: *Deleted

## 2011-05-12 MED ORDER — LEVOTHYROXINE SODIUM 88 MCG PO TABS: 88.0000 ug | ORAL_TABLET | Freq: Every day | ORAL | Status: DC

## 2011-05-13 ENCOUNTER — Other Ambulatory Visit: Payer: Self-pay | Admitting: *Deleted

## 2011-05-13 MED ORDER — LEVOTHYROXINE SODIUM 88 MCG PO TABS: 88.0000 ug | ORAL_TABLET | Freq: Every day | ORAL | Status: DC

## 2011-05-13 NOTE — Telephone Encounter (Signed)
Rx that was sent in on 05-12-11 was selected to print so did not go to pharmacy. Recent rx electronically.

## 2011-05-21 ENCOUNTER — Emergency Department: Payer: Self-pay | Admitting: Emergency Medicine

## 2011-05-26 ENCOUNTER — Other Ambulatory Visit: Payer: Self-pay | Admitting: *Deleted

## 2011-05-26 ENCOUNTER — Encounter: Payer: Self-pay | Admitting: Family Medicine

## 2011-05-26 ENCOUNTER — Ambulatory Visit (INDEPENDENT_AMBULATORY_CARE_PROVIDER_SITE_OTHER): Payer: Medicare Other | Admitting: Family Medicine

## 2011-05-26 VITALS — BP 124/84 | HR 92 | Temp 98.0°F | Ht 60.0 in | Wt 126.0 lb

## 2011-05-26 DIAGNOSIS — R112 Nausea with vomiting, unspecified: Secondary | ICD-10-CM

## 2011-05-26 MED ORDER — TAPENTADOL HCL 50 MG PO TABS: 50.0000 mg | ORAL_TABLET | Freq: Every day | ORAL | Status: DC | PRN

## 2011-05-26 NOTE — Progress Notes (Signed)
  Subjective:    Patient ID: Leslie Wong, female    DOB: [birth year], 55 y.o.   MRN: [id number]  HPIPt here for a letter to be written to the [employer] to allow her an extension on the time to submit her application for housing monetary help. She has had the paperwork for 60 days, it is due this week and she was seen at the ER Sat nite for AGE which affected her physically for a few days with N/V initially and then fatigue and feeling washed out, probably from the effects of dehydration. She is feeling back to normal now. She has had to use Zofran to control her nausea with chronic abdominal problems for quite some time. GI w/u presumed a problem with gastric emptying.     Review of Systems     Objective:   Physical ExamWDWN WF NAD HEENT wnl Heart RRR Lungs CTA Abd Soft, NT, Nml BS       Assessment & Plan:  Ltr written for [employer] for extension of application time

## 2011-05-26 NOTE — Telephone Encounter (Signed)
Pt forgot to tell you she needed this when she was here this morning.

## 2011-05-26 NOTE — Assessment & Plan Note (Signed)
Now resolved. See Dr Christella Hartigan in followup and for Zofran prescription.

## 2011-05-26 NOTE — Telephone Encounter (Signed)
Pt has made her last prescription for Nucynta from 11/09 last until now. She took her last pill yesterday for extreme back pain. She typically uses heat and ice and other conservative measures. As long as she makes a 30 tablet prescription last 6 mos at a time, I will prescribe that medication for her. She understands this and is agreeable. She will come in tomorrow to pick up the prescription.

## 2011-06-20 ENCOUNTER — Other Ambulatory Visit: Payer: Self-pay | Admitting: Gastroenterology

## 2011-06-20 ENCOUNTER — Other Ambulatory Visit: Payer: Self-pay | Admitting: *Deleted

## 2011-06-20 MED ORDER — ONDANSETRON 4 MG PO TBDP: 4.0000 mg | ORAL_TABLET | Freq: Every day | ORAL | Status: DC | PRN

## 2011-06-20 NOTE — Telephone Encounter (Signed)
Pt called and states she has picked up a virus and wants to have zofran refilled.  She has not seen Dr Christella Hartigan since 2010 and I did advise her that if she has a virus she would need to call her PCP.  She said she was going to call them today

## 2011-06-20 NOTE — Telephone Encounter (Signed)
Pt states she has a virus with a lot of nausea and is asking for a refill on zofran.

## 2011-06-22 ENCOUNTER — Other Ambulatory Visit: Payer: Self-pay | Admitting: Family Medicine

## 2011-06-24 ENCOUNTER — Telehealth: Payer: Self-pay | Admitting: *Deleted

## 2011-06-24 NOTE — Telephone Encounter (Signed)
Doctor at [facility] has faxed a copy of lab results for Dr. Hetty Ely.  There is no DOB on labs or cover sheet, I called the doctor to verify correct pt but they never called me back.  I also called the patient but she never called back either.  Note and labs are on your desk.

## 2011-06-24 NOTE — Telephone Encounter (Signed)
I'll review the note.  Please try to call them again on Monday.  Thanks.

## 2011-06-25 NOTE — Telephone Encounter (Signed)
Spoke to patient and was advised that labs were suppose to be sent to Dr. Hetty Ely from Dr. Lorayne Bender at [facility].

## 2011-06-25 NOTE — Telephone Encounter (Signed)
I'll check the hard copy.  

## 2011-06-29 ENCOUNTER — Telehealth: Payer: Self-pay | Admitting: Family Medicine

## 2011-06-29 NOTE — Telephone Encounter (Signed)
Please call pt and schedule lab visit.  Please talk to lab about getting results to go to Outpatient Psych clinic- Dr. Manuela Neptune at [facility].  Fax number [phone].

## 2011-06-30 NOTE — Telephone Encounter (Signed)
Left v/m at all contact #s for pt to callback. Terri in lab said after labs return to Dr Para March he can forward to Lugene's in basket and we can fax to [facility]. Thank you.

## 2011-07-04 NOTE — Telephone Encounter (Signed)
Noted, will defer to Dr. Hetty Ely.

## 2011-07-04 NOTE — Telephone Encounter (Signed)
Spoke to patient and was informed that she wants to see Dr. Hetty Ely and discuss any lab work that needs to be done prior to having this done. Appointment scheduled with Dr. Hetty Ely on 07/06/11 to discuss lab work.

## 2011-07-05 ENCOUNTER — Telehealth: Payer: Self-pay | Admitting: *Deleted

## 2011-07-05 NOTE — Telephone Encounter (Signed)
Noted  

## 2011-07-05 NOTE — Telephone Encounter (Signed)
Agree  Thank you

## 2011-07-05 NOTE — Telephone Encounter (Signed)
Pt called complaining of severe pain under right ribs.  Thinks it's her gallbladder.  States she has a low grade fever.  I advised her that if pain is that severe she should seek care at urgent care or ER.  Pt agreed.

## 2011-07-06 ENCOUNTER — Ambulatory Visit (INDEPENDENT_AMBULATORY_CARE_PROVIDER_SITE_OTHER): Payer: Medicare Other | Admitting: Family Medicine

## 2011-07-06 ENCOUNTER — Encounter: Payer: Self-pay | Admitting: Family Medicine

## 2011-07-06 ENCOUNTER — Ambulatory Visit: Payer: Medicare Other | Admitting: Family Medicine

## 2011-07-06 DIAGNOSIS — R1011 Right upper quadrant pain: Secondary | ICD-10-CM

## 2011-07-06 MED ORDER — GABAPENTIN 300 MG PO CAPS: ORAL_CAPSULE | ORAL | Status: DC

## 2011-07-06 NOTE — Patient Instructions (Signed)
Please proceed to the ER for acute evaluation.

## 2011-07-06 NOTE — Assessment & Plan Note (Signed)
Do not feel the pt should have to wait on test results and poss U/S or CT appt for evaluation. Sent her to the ER for eval.

## 2011-07-06 NOTE — Progress Notes (Signed)
  Subjective:    Patient ID: Leslie Wong, female    DOB: 1956/06/29, 55 y.o.   MRN: [id number]  HPI Pt here as acute appt for RUQ abd pain which began two days ago after eating a meal of macaroni and cheese, baked potato with butter and salad. She has not had gallbladder disease in the past. She is fertile, female and [age], she is not fat. She had been seen at the ER a few weeks ago for N&V.  She has ongoing IBS like sxs frequently without much resolution from trmnt. Her sxs today are totally different from anything she has had in the past. The pain is sharp and vascillating, comes and goes in waves up under her liver area. She looked up sxs last night on the internet and found on a doctor info site to take alka selzer in water which helped her have a BM but has done nothing for relieving the pain.     Review of Systems  Gastrointestinal: Positive for nausea (with the pain) and abdominal pain (ruq). Negative for vomiting (hasn't vomited yet, pain sometimes acute enough to almost cause this.), diarrhea, constipation, blood in stool, abdominal distention, anal bleeding and rectal pain.       Objective:   Physical Exam  Constitutional: She is oriented to person, place, and time. She appears well-developed and well-nourished. She appears distressed (with waves of pain).  HENT:  Head: Normocephalic and atraumatic.  Eyes: Conjunctivae are normal.  Neck: Neck supple.  Cardiovascular: Normal rate.   Pulmonary/Chest: Effort normal and breath sounds normal. No respiratory distress.  Abdominal: Soft. She exhibits no distension (RUQ along medial liver edge) and no mass. There is tenderness. There is no rebound and no guarding.  Neurological: She is alert and oriented to person, place, and time.  Skin: Skin is warm and dry. She is not diaphoretic.  Psychiatric: She has a normal mood and affect. Her behavior is normal. Judgment and thought content normal.          Assessment & Plan:

## 2011-07-06 NOTE — Progress Notes (Signed)
  Subjective:    Patient ID: Leslie Wong, female    DOB: [birth year], 55 y.o.   MRN: [id number]  HPIPt asked for prescription for Gabapentin as her Psychiatrist suggested this but he has moved his practice to [facility] and is no longer available to the pt. Will fill for the next 6 months. She has been off of this for a few weeks to months. She takes the Nucynta very infrequently.    Review of Systems     Objective:   Physical Exam        Assessment & Plan:

## 2011-07-06 NOTE — Progress Notes (Signed)
Addended by: Arta Silence on: 07/06/2011 06:04 PM   Modules accepted: Orders

## 2011-09-14 ENCOUNTER — Encounter: Payer: Self-pay | Admitting: Family Medicine

## 2011-09-14 ENCOUNTER — Ambulatory Visit (INDEPENDENT_AMBULATORY_CARE_PROVIDER_SITE_OTHER): Payer: Medicare Other | Admitting: Family Medicine

## 2011-09-14 DIAGNOSIS — M549 Dorsalgia, unspecified: Secondary | ICD-10-CM

## 2011-09-14 DIAGNOSIS — G8929 Other chronic pain: Secondary | ICD-10-CM

## 2011-09-14 MED ORDER — TOPIRAMATE 25 MG PO TABS: ORAL_TABLET | ORAL | Status: DC

## 2011-09-14 MED ORDER — OMEPRAZOLE 20 MG PO CPDR: DELAYED_RELEASE_CAPSULE | ORAL | Status: DC

## 2011-09-14 NOTE — Patient Instructions (Signed)
RTC 5 weeks

## 2011-09-14 NOTE — Progress Notes (Signed)
  Subjective:    Patient ID: Leslie Wong, female    DOB: Oct 12, 1956, 55 y.o.   MRN: [id number]  HPI Pt here for further evaluation. When last seen she was having RUQ pain and was sent from here to go to the ER for acute eval and possibly and U/S or CT/MRI acutely. She went to [facility] and U/S was nml and she was told her pain was from stress. When here, she was also given at her request a prescription for Gabapentin in place of an outdated script her psychiatrist had given her before he had moved his practice to [place]. The Gabapentin was to help her with pain control. She started the Gabapentin the next day. She started  having sxs the day after increasing the medication to twice a day. These were feeling like her tongue was swelling with  the inside of her mouth feeling raw and her lips feeling like they were inside out.  She has had her psychiatrist changed every four years. She does not know the name of her new psychiatrist as she has not heard from them.     Review of Systems  Constitutional: Negative for fever, chills, diaphoresis, activity change and fatigue.  HENT: Negative for ear pain, congestion, rhinorrhea and postnasal drip.   Eyes: Negative for redness.  Respiratory: Negative for cough, chest tightness, shortness of breath and wheezing.   Cardiovascular: Negative for chest pain.  Noncontributory except as above.       Objective:   Physical Exam        Assessment & Plan:

## 2011-09-14 NOTE — Assessment & Plan Note (Signed)
Pt typically treated by Psychiatry for this as she is also bipolar and has many reactions/interactions with/to medication. Will try to substitute Topamax for Neurontin to help control her chronic pain.  Will try to taper slowly. Start with 25mg  at night for three weeks and then increase to twice a day. May take Benadryl prior to lessen risk of reaction.

## 2011-09-22 ENCOUNTER — Telehealth: Payer: Self-pay | Admitting: *Deleted

## 2011-09-22 NOTE — Telephone Encounter (Signed)
Left v/m on pt's home and cell # for pt to callback. 

## 2011-09-22 NOTE — Telephone Encounter (Signed)
Pt states she started topamax last Wednesday and is having a reaction.  States her tongue looks like it has cuts in it, her mouth is raw, has blisters on her lips.  Her pharmacist told her to stop taking the medicine.  She is asking what else she can try. Please advise.

## 2011-09-22 NOTE — Telephone Encounter (Signed)
Given her history of mult allergies/intolernces, I wouldn't change any other meds now (without rechecking her first).  I would allow this time to resolve off the topamax and then f/u with either Hetty Ely or another MD here.  If she is scheduled with me, please get me .  Thanks.

## 2011-09-22 NOTE — Telephone Encounter (Signed)
Tried again to reach pt. Left v/m for pt to call back.

## 2011-09-23 NOTE — Telephone Encounter (Signed)
Advised pt as instructed.  She asks what is she supposed to do about all the pain that she is having?  She says no otc pain meds work.

## 2011-09-23 NOTE — Telephone Encounter (Signed)
I am not comfortable changing her meds w/o eval.  She needs OV.

## 2011-09-23 NOTE — Telephone Encounter (Signed)
Pt left v/m to call her at [phone] or [phone] or [phone]. I called all contact #s and left v/m for pt to call back.

## 2011-09-23 NOTE — Telephone Encounter (Signed)
Patient advised as instructed via telephone, appt scheduled for 09/28/2011 to see Dr. Hetty Ely.  Patient stated that she would rather not see Dr. Para March anymore and Dr. Hetty Ely has been her doctor for over 20 years.

## 2011-09-28 ENCOUNTER — Ambulatory Visit (INDEPENDENT_AMBULATORY_CARE_PROVIDER_SITE_OTHER): Payer: Medicare Other | Admitting: Family Medicine

## 2011-09-28 ENCOUNTER — Other Ambulatory Visit: Payer: Self-pay | Admitting: Family Medicine

## 2011-09-28 ENCOUNTER — Encounter: Payer: Self-pay | Admitting: Family Medicine

## 2011-09-28 DIAGNOSIS — M549 Dorsalgia, unspecified: Secondary | ICD-10-CM

## 2011-09-28 DIAGNOSIS — G8929 Other chronic pain: Secondary | ICD-10-CM

## 2011-09-28 DIAGNOSIS — Z0000 Encounter for general adult medical examination without abnormal findings: Secondary | ICD-10-CM

## 2011-09-28 MED ORDER — TAPENTADOL HCL 50 MG PO TABS: ORAL_TABLET | ORAL | Status: DC

## 2011-09-28 NOTE — Patient Instructions (Addendum)
Refer for mammogram to [facility]. RTC one month.

## 2011-09-28 NOTE — Assessment & Plan Note (Signed)
Continues with discomfort in the L/S area and tingling/gnawing down the lateral right leg, sometimes to the right foot. Will increase Nucynta to bid. See her back in one month.

## 2011-09-28 NOTE — Progress Notes (Signed)
  Subjective:    Patient ID: Leslie Wong, female    DOB: 04-May-1956, 55 y.o.   MRN: [id number]  HPI Pt here for followup of chronic pain which had been being treated due to her other psychiatrict meds by her psychiatrist. However her Psych moved his practice to [facility] and she was told to find a new doctor. In the meantime, she had a reaction to the Gabapentin he had prescribed so Topiramate was substituted the last time I saw her. She had the same type of reaction with swelling of the inside of the mouth and tongue with the feeling of razor blades in her tongue. She took her last Nucynta last night. She has had Gabapentin, Topamax, Lyrica, Seroquel and has had reactions to all. I have given her some very small amount of Nucynta, which she has tolerated well and has lived up to our contract.  Her right side from her shoulder to past her right hip hurts constantly and feels as if someone is gnawing on her. 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 She has also had episodes of LUQ abd pain at times that has been significant. The Nucynta medication helps the pain without causing other problems or side effects. She will be switching medical practices upon my retirement to a practice closer to her home.    Review of SystemsNoncontributory except as above.       Objective:   Physical Exam  Constitutional: She appears well-developed and well-nourished. No distress.  HENT:  Head: Normocephalic and atraumatic.  Right Ear: External ear normal.  Left Ear: External ear normal.  Nose: Nose normal.  Mouth/Throat: Oropharynx is clear and moist. No oropharyngeal exudate.  Eyes: Conjunctivae and EOM are normal. Pupils are equal, round, and reactive to light.  Neck: Normal range of motion. Neck supple. No thyromegaly present.  Cardiovascular: Normal rate, regular rhythm and normal heart sounds.   Pulmonary/Chest: Effort normal and breath sounds normal. She has no  wheezes. She has no rales.  Musculoskeletal: Normal range of motion.       Is able to move in LB for Nml ROM but has to do so very slowly and carefully. Has radiculopathy laterally along the upper left leg. Says this tingling discomfort occas radiates into the ankle and foot area laterally as well.  Lymphadenopathy:    She has no cervical adenopathy.  Skin: She is not diaphoretic.          Assessment & Plan:

## 2011-10-20 ENCOUNTER — Ambulatory Visit (INDEPENDENT_AMBULATORY_CARE_PROVIDER_SITE_OTHER): Payer: Medicare Other | Admitting: Family Medicine

## 2011-10-20 ENCOUNTER — Encounter: Payer: Self-pay | Admitting: Family Medicine

## 2011-10-20 DIAGNOSIS — G8929 Other chronic pain: Secondary | ICD-10-CM

## 2011-10-20 DIAGNOSIS — F319 Bipolar disorder, unspecified: Secondary | ICD-10-CM

## 2011-10-20 DIAGNOSIS — M549 Dorsalgia, unspecified: Secondary | ICD-10-CM

## 2011-10-20 NOTE — Patient Instructions (Addendum)
Have fasting labs done in the AM and sent to Dr Joaquim Lai, her psychiatrist, [phone]. Keep mammo appt next month. RTC in early Dec for PE  Fasting chol. Remainder of labs will be done tomorrow.

## 2011-10-20 NOTE — Assessment & Plan Note (Signed)
Establishing with new doctor.  Will have labs done here and fax to Dr Lissa Morales, Dept of Psychiatry, [facility], [phone], Fax [phone].

## 2011-10-20 NOTE — Assessment & Plan Note (Signed)
Better on bid Nucynta. Continue. Will write scripts monthly.

## 2011-10-20 NOTE — Progress Notes (Signed)
  Subjective:    Patient ID: Leslie Wong, female    DOB: 31-May-1956, 55 y.o.   MRN: [id number]  HPI Pt here for followup after increasing dose of Nucynta to twice a day for better pain control. This helps a lot and she is being very compliant with dosing. She has been unable for various reasons to take any other medication for pain.  She has now established with a new Psychiatrist at [facility] who wants a battery of labs drawn. It would be best if these were done fasting.  She otherwise feels reasonably well with no complaints. She gets stiff in the lower back easily.  She has found that her daughter has Chiari Malformation and will be going to [facility] to talk to someone who treats this. She is having lots of pain, nothing helps it.     Review of SystemsNoncontributory except as above.       Objective:   Physical Exam  Constitutional: She is oriented to person, place, and time. She appears well-developed and well-nourished. No distress.  HENT:  Head: Normocephalic and atraumatic.  Right Ear: External ear normal.  Left Ear: External ear normal.  Nose: Nose normal.  Mouth/Throat: Oropharynx is clear and moist. No oropharyngeal exudate.  Eyes: Conjunctivae and EOM are normal. Pupils are equal, round, and reactive to light.  Neck: Normal range of motion. Neck supple. No thyromegaly present.  Cardiovascular: Normal rate, regular rhythm and normal heart sounds.   Pulmonary/Chest: Effort normal and breath sounds normal. She has no wheezes. She has no rales.  Musculoskeletal: She exhibits tenderness.       Stiff in the lower back with sitting discussing situation at start of appt. ROM mildly decreased in lower back.  Lymphadenopathy:    She has no cervical adenopathy.  Neurological: She is alert and oriented to person, place, and time. No cranial nerve deficit. She exhibits normal muscle tone. Coordination normal.  Skin: Skin is warm and dry. She is not diaphoretic.  Psychiatric: She has a  normal mood and affect. Her behavior is normal. Judgment and thought content normal.          Assessment & Plan:

## 2011-10-27 MED ORDER — TAPENTADOL HCL 50 MG PO TABS: ORAL_TABLET | ORAL | Status: DC

## 2011-10-27 NOTE — Progress Notes (Signed)
Addended by: Arta Silence on: 10/27/2011 04:44 PM   Modules accepted: Orders

## 2011-11-22 ENCOUNTER — Ambulatory Visit: Payer: Self-pay | Admitting: Family Medicine

## 2011-11-25 ENCOUNTER — Encounter: Payer: Self-pay | Admitting: Family Medicine

## 2011-11-30 ENCOUNTER — Encounter: Payer: Self-pay | Admitting: Family Medicine

## 2011-11-30 ENCOUNTER — Ambulatory Visit (INDEPENDENT_AMBULATORY_CARE_PROVIDER_SITE_OTHER): Payer: Medicare Other | Admitting: Family Medicine

## 2011-11-30 ENCOUNTER — Other Ambulatory Visit (HOSPITAL_COMMUNITY)
Admission: RE | Admit: 2011-11-30 | Discharge: 2011-11-30 | Disposition: A | Discharge status: Home / Self Care | Payer: Medicare Other | Source: Ambulatory Visit | Attending: Family Medicine | Admitting: Family Medicine

## 2011-11-30 DIAGNOSIS — E039 Hypothyroidism, unspecified: Secondary | ICD-10-CM

## 2011-11-30 DIAGNOSIS — G43909 Migraine, unspecified, not intractable, without status migrainosus: Secondary | ICD-10-CM

## 2011-11-30 DIAGNOSIS — Z1159 Encounter for screening for other viral diseases: Secondary | ICD-10-CM | POA: Insufficient documentation

## 2011-11-30 DIAGNOSIS — Z0000 Encounter for general adult medical examination without abnormal findings: Secondary | ICD-10-CM

## 2011-11-30 DIAGNOSIS — G8929 Other chronic pain: Secondary | ICD-10-CM

## 2011-11-30 DIAGNOSIS — K589 Irritable bowel syndrome without diarrhea: Secondary | ICD-10-CM

## 2011-11-30 DIAGNOSIS — Z124 Encounter for screening for malignant neoplasm of cervix: Secondary | ICD-10-CM | POA: Insufficient documentation

## 2011-11-30 DIAGNOSIS — N6019 Diffuse cystic mastopathy of unspecified breast: Secondary | ICD-10-CM

## 2011-11-30 DIAGNOSIS — F319 Bipolar disorder, unspecified: Secondary | ICD-10-CM

## 2011-11-30 DIAGNOSIS — M549 Dorsalgia, unspecified: Secondary | ICD-10-CM

## 2011-11-30 DIAGNOSIS — D72829 Elevated white blood cell count, unspecified: Secondary | ICD-10-CM

## 2011-11-30 DIAGNOSIS — E7800 Pure hypercholesterolemia, unspecified: Secondary | ICD-10-CM

## 2011-11-30 DIAGNOSIS — R1011 Right upper quadrant pain: Secondary | ICD-10-CM

## 2011-11-30 DIAGNOSIS — R112 Nausea with vomiting, unspecified: Secondary | ICD-10-CM

## 2011-11-30 DIAGNOSIS — Z87448 Personal history of other diseases of urinary system: Secondary | ICD-10-CM

## 2011-11-30 LAB — HEPATIC FUNCTION PANEL
AST: 21 U/L (ref 0–37)
Albumin: 4.5 g/dL (ref 3.5–5.2)
Alkaline Phosphatase: 98 U/L (ref 39–117)

## 2011-11-30 LAB — RENAL FUNCTION PANEL
Albumin: 4.5 g/dL (ref 3.5–5.2)
Calcium: 10.4 mg/dL (ref 8.4–10.5)
Creatinine, Ser: 1 mg/dL (ref 0.4–1.2)
Glucose, Bld: 102 mg/dL — ABNORMAL HIGH (ref 70–99)
Potassium: 4.1 mEq/L (ref 3.5–5.1)

## 2011-11-30 LAB — BASIC METABOLIC PANEL
Chloride: 108 mEq/L (ref 96–112)
Potassium: 4.1 mEq/L (ref 3.5–5.1)

## 2011-11-30 LAB — CBC WITH DIFFERENTIAL/PLATELET
Basophils Relative: 0.4 % (ref 0.0–3.0)
Eosinophils Relative: 2.5 % (ref 0.0–5.0)
HCT: 43.1 % (ref 36.0–46.0)
Hemoglobin: 14.6 g/dL (ref 12.0–15.0)
Lymphs Abs: 3.4 10*3/uL (ref 0.7–4.0)
MCV: 96.4 fl (ref 78.0–100.0)
Monocytes Absolute: 0.9 10*3/uL (ref 0.1–1.0)
RBC: 4.47 Mil/uL (ref 3.87–5.11)
WBC: 14.8 10*3/uL — ABNORMAL HIGH (ref 4.5–10.5)

## 2011-11-30 LAB — TSH: TSH: 0.69 u[IU]/mL (ref 0.35–5.50)

## 2011-11-30 LAB — LIPID PANEL: VLDL: 74.6 mg/dL — ABNORMAL HIGH (ref 0.0–40.0)

## 2011-11-30 LAB — LDL CHOLESTEROL, DIRECT: Direct LDL: 129.8 mg/dL

## 2011-11-30 MED ORDER — LEVOTHYROXINE SODIUM 88 MCG PO TABS: 88.0000 ug | ORAL_TABLET | Freq: Every day | ORAL | Status: DC

## 2011-11-30 MED ORDER — TAPENTADOL HCL 50 MG PO TABS: ORAL_TABLET | ORAL | Status: DC

## 2011-11-30 MED ORDER — VALACYCLOVIR HCL 500 MG PO TABS: 500.0000 mg | ORAL_TABLET | Freq: Two times a day (BID) | ORAL | Status: DC

## 2011-11-30 NOTE — Assessment & Plan Note (Signed)
Will recheck today. Psych wants labs sent to them also. No signs or sxs today. Does have vomiting occas.

## 2011-11-30 NOTE — Patient Instructions (Addendum)
RTC one month with Dr Para March to establish and Nucynta refill. Labs today, report via phone tree.

## 2011-11-30 NOTE — Assessment & Plan Note (Signed)
Benign exam today. Recent mammo nml.

## 2011-11-30 NOTE — Assessment & Plan Note (Signed)
Happens occasionally with signif GI workup and no appreciable findings.

## 2011-11-30 NOTE — Assessment & Plan Note (Signed)
Continues with symptoms at times. Better and more stable than she used to be.

## 2011-11-30 NOTE — Assessment & Plan Note (Signed)
Stable, has them periodically.

## 2011-11-30 NOTE — Assessment & Plan Note (Signed)
Stable today. Seeing a new psychiatrist at [facility] soon.

## 2011-11-30 NOTE — Progress Notes (Signed)
Subjective:    Patient ID: Leslie Wong, female    DOB: 1956-07-23, 55 y.o.   MRN: [id number]  HPI Pt here for Comp Exam. She has not had one for some time. She hasn't had a Pap since 1999.  She has no problems except the routine back pain. She takes Nucynta twice a day and it controls things pretty well. She had AGE last week and was very sick from it, now resolved.     Review of Systems  Constitutional: Negative for fever, chills, diaphoresis (has hot flashes at times. ), activity change, appetite change, fatigue and unexpected weight change.       Tired today.  HENT: Negative for hearing loss, ear pain, nosebleeds, rhinorrhea, trouble swallowing, tinnitus (occas ringing.) and ear discharge.   Eyes: Negative for pain, discharge, redness and visual disturbance.  Respiratory: Negative for cough, chest tightness, shortness of breath and wheezing.   Cardiovascular: Negative for chest pain, palpitations and leg swelling.  Gastrointestinal: Negative for nausea, vomiting (has occas vomiyting episodes which last a while after migraines.), abdominal pain, diarrhea, constipation, blood in stool and abdominal distention.  Genitourinary: Negative for dysuria and frequency.  Musculoskeletal: Negative for myalgias and arthralgias. Back pain: chronic, takes Nucynta.  Skin: Negative for rash.  Neurological: Positive for numbness (left hand at times. had injured her right arm in the past.). Negative for dizziness, tremors and syncope.  Hematological: Negative for adenopathy. Does not bruise/bleed easily.  Psychiatric/Behavioral: Negative for hallucinations and agitation. The patient is not nervous/anxious.        Objective:   Physical Exam  Constitutional: She is oriented to person, place, and time. She appears well-developed and well-nourished. No distress.  HENT:  Head: Normocephalic and atraumatic.  Right Ear: External ear normal.  Left Ear: External ear normal.  Nose: Nose normal.    Mouth/Throat: Oropharynx is clear and moist.  Eyes: Conjunctivae and EOM are normal. Pupils are equal, round, and reactive to light. No scleral icterus.  Neck: Normal range of motion. Neck supple. No thyromegaly present.  Cardiovascular: Normal rate, regular rhythm, normal heart sounds and intact distal pulses.   No murmur heard. Pulmonary/Chest: Effort normal and breath sounds normal. No respiratory distress. She has no wheezes.  Abdominal: Soft. Bowel sounds are normal. She exhibits no mass. There is no tenderness.  Genitourinary: Vagina normal. Guaiac negative stool. No vaginal discharge found.       Introitus Nml Uterus and cervix absent. Ovaries Not Felt   Musculoskeletal: Normal range of motion. She exhibits no tenderness (lower back in midline approx L3-5).       Back Nontender in the Midline  Lymphadenopathy:    She has no cervical adenopathy.  Neurological: She is alert and oriented to person, place, and time. She exhibits normal muscle tone. Coordination normal.  Skin: Skin is warm and dry. No rash noted. She is not diaphoretic.  Psychiatric: She has a normal mood and affect. Her behavior is normal. Judgment and thought content normal.          Assessment & Plan:  HMPE  I have personally reviewed the Medicare Annual Wellness questionnaire and have noted 1. The patient's medical and social history 2. Their use of alcohol, tobacco or illicit drugs 3. Their current medications and supplements 4. The patient's functional ability including ADL's, fall risks, home safety risks and hearing or visual             impairment. 5. Diet and physical activities 6. Evidence for depression  or mood disorders

## 2011-11-30 NOTE — Assessment & Plan Note (Signed)
Has had this since at least 1994 or so. Reasonably stable on Nucynta, taking bid regularly without abuse or acceleration. Agrees to continue. Script written today.

## 2011-11-30 NOTE — Assessment & Plan Note (Addendum)
Will recheck today. Was due to steroids in the past. Lab Results  Component Value Date   WBC 16.4* 03/25/2010   HGB 14.4 03/25/2010   HCT 42.4 03/25/2010   MCV 95.7 03/25/2010   PLT 326.0 03/25/2010

## 2011-11-30 NOTE — Assessment & Plan Note (Signed)
Will recheck via labs today. Cont curr med. Script sent in.

## 2011-12-01 ENCOUNTER — Encounter: Payer: Self-pay | Admitting: *Deleted

## 2011-12-01 LAB — VITAMIN D 25 HYDROXY (VIT D DEFICIENCY, FRACTURES): Vit D, 25-Hydroxy: 21 ng/mL — ABNORMAL LOW (ref 30–89)

## 2011-12-01 LAB — LITHIUM LEVEL: Lithium Lvl: 0.43 mEq/L — ABNORMAL LOW (ref 0.80–1.40)

## 2011-12-01 LAB — PTH, INTACT AND CALCIUM
Calcium, Total (PTH): 10.4 mg/dL (ref 8.4–10.5)
PTH: 85.6 pg/mL — ABNORMAL HIGH (ref 14.0–72.0)

## 2011-12-06 ENCOUNTER — Emergency Department: Payer: Self-pay | Admitting: Emergency Medicine

## 2011-12-07 ENCOUNTER — Encounter: Payer: Self-pay | Admitting: Family Medicine

## 2011-12-08 ENCOUNTER — Encounter: Payer: Self-pay | Admitting: *Deleted

## 2011-12-30 DIAGNOSIS — F319 Bipolar disorder, unspecified: Secondary | ICD-10-CM | POA: Diagnosis not present

## 2012-01-02 ENCOUNTER — Encounter: Payer: Self-pay | Admitting: Family Medicine

## 2012-01-02 ENCOUNTER — Ambulatory Visit (INDEPENDENT_AMBULATORY_CARE_PROVIDER_SITE_OTHER): Payer: Medicare Other | Admitting: Family Medicine

## 2012-01-02 DIAGNOSIS — M549 Dorsalgia, unspecified: Secondary | ICD-10-CM | POA: Diagnosis not present

## 2012-01-02 DIAGNOSIS — F319 Bipolar disorder, unspecified: Secondary | ICD-10-CM

## 2012-01-02 DIAGNOSIS — G8929 Other chronic pain: Secondary | ICD-10-CM

## 2012-01-02 MED ORDER — TAPENTADOL HCL ER 50 MG PO TB12: 50.0000 mg | ORAL_TABLET | Freq: Two times a day (BID) | ORAL | Status: DC

## 2012-01-02 MED ORDER — ONDANSETRON 4 MG PO TBDP: 4.0000 mg | ORAL_TABLET | Freq: Every day | ORAL | Status: DC | PRN

## 2012-01-02 MED ORDER — VALACYCLOVIR HCL 500 MG PO TABS: 500.0000 mg | ORAL_TABLET | Freq: Two times a day (BID) | ORAL | Status: DC

## 2012-01-02 NOTE — Progress Notes (Signed)
Mood has been okay.  Followed by psych at [facility].  She's worried about her daughter.  Daughter had to have hysterectomy and will have to have surgery related to Chiari malformation.     Back pain.  Lower back, some radiation, improved with nucynta; she can get through the day with that.  No illicits.  Needs rx for ER form of medicine. Prev with pain clinic eval.    Meds, vitals, and allergies reviewed.   ROS: See HPI.  Otherwise, noncontributory.  nad Speech fluent, judgement appears intact rrr ctab Pain with flex and ext of back Lumbar spine with midline pain on palpation and lateral radiation Motor exam in BLE okay except as limited by pain

## 2012-01-02 NOTE — Patient Instructions (Addendum)
When you get to the last week of medicine, then let us know about getting a refill.  Take care.  Glad to see you.

## 2012-01-03 ENCOUNTER — Encounter: Payer: Self-pay | Admitting: Family Medicine

## 2012-01-03 NOTE — Assessment & Plan Note (Signed)
Per psych 

## 2012-01-03 NOTE — Assessment & Plan Note (Signed)
Old records reviewed.  She has chronic pain, tolerable with current meds.  Denies illicits, diversion.  Will continue with ER form and she'll call back monthly for refills, assuming she is doing well.  Okay for outpatient f/u.  >25 min spent with face to face with patient, >50% counseling and/or coordinating care.

## 2012-01-10 ENCOUNTER — Telehealth: Payer: Self-pay | Admitting: Internal Medicine

## 2012-01-10 NOTE — Telephone Encounter (Signed)
Patient called and stated she had a allergic reaction to the Nucynta ER it made her break out in sweats, dizzy and diaherra.  She did say just the Nucynta not the ER she done fine on but for her insurance to cover it we have to call [phone] and ask for coverage request expedite and their fax number is [phone].

## 2012-01-11 MED ORDER — TAPENTADOL HCL 50 MG PO TABS: ORAL_TABLET | ORAL | Status: DC

## 2012-01-11 NOTE — Telephone Encounter (Signed)
Patient notified that rx is up front and ready for pickup. 

## 2012-01-11 NOTE — Telephone Encounter (Signed)
Spoke to Leslie Wong at [employer] and she will fax over the paperwork to be completed to start the prior authorization process.

## 2012-01-11 NOTE — Telephone Encounter (Signed)
I printed the rx for the regular release- I'll sign it when I get to clinic and then please give it to patient- and added the ER form to the intolerance list.  Please call and get the coverage request started.

## 2012-01-24 ENCOUNTER — Telehealth: Payer: Self-pay | Admitting: Family Medicine

## 2012-01-24 MED ORDER — OSELTAMIVIR PHOSPHATE 75 MG PO CAPS: 75.0000 mg | ORAL_CAPSULE | Freq: Two times a day (BID) | ORAL | Status: AC

## 2012-01-24 NOTE — Telephone Encounter (Signed)
Addended by: Lars Mage on: 01/24/2012 05:52 PM   Modules accepted: Orders

## 2012-01-24 NOTE — Telephone Encounter (Signed)
rx sent for tamiflu.  She can take plain mucinex also.  She'll call about getting that delivered.

## 2012-01-24 NOTE — Telephone Encounter (Signed)
Called again to request form to be faxed.  Gave the fax number at my desk.

## 2012-01-24 NOTE — Telephone Encounter (Signed)
I called pt.  Sx reviewed.  She didn't get flu shot this year.  Sx started yesterday.  Never on tamiflu.

## 2012-01-24 NOTE — Telephone Encounter (Signed)
Triage Record Num: [id number] Operator: Valene Bors Patient Name: Leslie Wong Call Date & Time: 01/24/2012 2:28:10PM Patient Phone: [phone] PCP: Crawford Givens Patient Gender: Female PCP Fax : Patient DOB: 04-Jul-1956 Practice Name: [facility] Reason for Call: Caller: Rosey/Patiend; PCP: Crawford Givens Clelia Croft); CB#: [phone]; Call regarding Cold Like Symptoms- Congestion and Cough- onset 01/23/12. Temp on and off up to 101.0 oral. She has bodyaches and headache; Occasional loose cough. Care advice per URI Protocol and appnt advised within 24 hours. She does not have a way to get into office today or tomorrow and is requesting that medication be sent to her pharmacy to be delivered to her. PLEASE HAVE DR. Dwana Curd RN CALL HER BACK/SHE STATES SHE HAS NO ONE TO BRING HER TO OFFICE. Protocol(s) Used: Upper Respiratory Infection (URI) Recommended Outcome per Protocol: See Provider within 24 hours Reason for Outcome: Facial pain (fullness, pressure, worsens with bending over), frontal headache, yellow-green nasal discharge AND any temperature elevation Care Advice: ~ Use a cool mist humidifier to moisten air. Be sure to clean according to manufacturer's instructions. ~ See provider in 8 hours if redness, swelling and tenderness develops above or below eyes/near ears/over sinuses ~ SYMPTOM / CONDITION MANAGEMENT ~ CAUTIONS A warm, moist compress placed on face, over eyes for 15 to 20 minutes, 5 to 6 times a day, may help relieve the congestion. ~ Consider nonprescription decongestant (Sudafed, Drixoral) for relief of symptoms after checking with a provider, especially if there is a history of hypertension, hyperthyroidism, heart disease, diabetes, glaucoma, urinary retention caused by prostatic hypertrophy. ~ Analgesic/Antipyretic Advice - Acetaminophen: Consider acetaminophen as directed on label or by pharmacist/provider for pain or fever PRECAUTIONS: - Use if  there is no history of liver disease, alcoholism, or intake of three or more alcohol drinks per day - Only if approved by provider during pregnancy or when breastfeeding - During pregnancy, acetaminophen should not be taken more than 3 consecutive days without telling provider - Do not exceed recommended dose or frequency ~ If pregnancy is known or suspected, get advice from provider before using any nonprescription medication other than acetaminophen ~ Nasal Irrigation To Relieve Congestion: - Wash hands with soap and water. - Add 1/2 teaspoon salt to 1 cup warm water to make saltwater solution. - Use a bulb syringe to draw up the saltwater solution. Turn the bulb upright to squeeze out any air. Fill the syringe until is full. - Bend over sink bowl and lean slightly toward sink. Gently insert the tip of the syringe into nostril about 1/2 inch. Point tip toward outer corner of eye and GENTLY squeeze bulb to squirt saltwater into nostril. Forceful irrigation to clear sinuses requires the use of sterile water or saline. - Let solution drain from nose. Solution may come out of the other nostril or even your mouth. Do two full syringes of solution in each nostril. ~ 01/24/2012 2:44:27PM Page 1 of 2 CAN_[email or web address]_V2 [facility] Triage Call Report Patient Name: Sharene Krikorian continuation page/s - Rinse syringe in clean water several times. Be sure water comes out clear. Dry the bulb syringe and store in a container or plastic bag. 01/24/2012 2:44:27PM Page 2 of 2 CAN_[email or web address]_[email or web address]

## 2012-01-24 NOTE — Telephone Encounter (Signed)
Form in your inbox 

## 2012-01-25 NOTE — Telephone Encounter (Signed)
I'll address the hard copy.  

## 2012-01-30 ENCOUNTER — Telehealth: Payer: Self-pay | Admitting: *Deleted

## 2012-01-30 NOTE — Telephone Encounter (Signed)
Request for additional information on this patient's prior authorization is requested from the pharmacy.  Letter is in your in box.

## 2012-01-30 NOTE — Telephone Encounter (Signed)
I didn't see this in my inbox.  Please let me know about this.

## 2012-01-31 NOTE — Telephone Encounter (Signed)
I didn't get this back, did it go to [facility]?

## 2012-01-31 NOTE — Telephone Encounter (Signed)
I believe so.

## 2012-01-31 NOTE — Telephone Encounter (Signed)
Faxed earlier this AM, sorry.

## 2012-01-31 NOTE — Telephone Encounter (Signed)
Form signed.  Please send in.

## 2012-02-07 ENCOUNTER — Telehealth: Payer: Self-pay | Admitting: Family Medicine

## 2012-02-07 MED ORDER — TAPENTADOL HCL 50 MG PO TABS: ORAL_TABLET | ORAL | Status: DC

## 2012-02-07 NOTE — Telephone Encounter (Signed)
Printed.  Please give to pt. She was getting 60 at a time, for BID dosing.

## 2012-02-07 NOTE — Telephone Encounter (Signed)
[place], [place] [address] p. [phone] f. [phone] To: [facility] (Daytime Triage) Fax: [phone] From: [facility] Date/ Time: 02/07/2012 10:31 AM Taken By: Annalee Genta, CSR Caller: Rosey Bath Facility: not collected Patient: Leslie Wong, Leslie Wong DOB: 1956/10/28 Phone: [phone] Reason for Call: Please call pt to advise when she can pick up Rx for Nucynta 50mg  tabs #90. She would like to pick this up by end of week, as she is going out of town next week for her daughter's surgery. Regarding Appointment: Appt Date: Appt Time: Unknown Provider: Reason: Details: Outcome:

## 2012-02-07 NOTE — Telephone Encounter (Signed)
Patient called.  Rx left at front desk for pick up.

## 2012-02-29 ENCOUNTER — Telehealth: Payer: Self-pay | Admitting: Family Medicine

## 2012-02-29 NOTE — Telephone Encounter (Signed)
Triage Record Num: [id number] Operator: Geanie Berlin Patient Name: Leslie Wong Call Date & Time: 02/29/2012 2:57:22PM Patient Phone: [phone] PCP: Crawford Givens Patient Gender: Female PCP Fax : Patient DOB: 1956-11-01 Practice Name: [facility] Reason for Call: Caller: [name]/Patient is calling with a question about Z-Pak. PCP is Para March, Dr. Cheree Ditto. Asking for antibiotic for cough and chest congestion. Onset 02/26/12. Temp not checked; temp 101 po at 0700 02/28/12. LMP/hysterectomy. Notes small amt of yellow sputum. Minimal smoker. Advised to see MD within 24 hrs for productive cough with colored sputum per Cough Guideline. Unable to come to office d/t daughter just had brain surgery 2 wks ago and caller is her caretaker. Informed of office policy that no antibiotic can be called in without appt. Protocol(s) Used: Cough - Adult Recommended Outcome per Protocol: See Provider within 24 hours Reason for Outcome: Productive cough with colored sputum (other than clear or white sputum) Care Advice: Limit or avoid exposure to irritants and allergens (e.g. air pollution, smoke/smoking, chemicals, dust, pollen, pet dander, etc.) ~ Call provider if fever greater than 101.5 F (38.6 C) or 100.5 F (38.1C) in an immunocompromised patient (such as diabetes, HIV/AIDS, renal disease, chemotherapy, organ transplant, or chronic steroid use) has not improved in 24 hours. ~ Increase fluids to 8-12 eight oz (1.6 to 2.4 liters) glasses per day, half of them to be water. Soups, popsicles, fruit juices, non-caffeinated sodas (unless restricting sodium intake), jello, broths, decaf teas, etc. are all okay. Warm fluids can be soothing. ~ ~ If you can, stop smoking now and avoid all secondhand smoke. Coughing up mucus or phlegm helps to get rid of an infection. A productive cough should not be stopped. A cough medicine with guaifenesin (Robitussin, Mucinex) can help loosen the mucus. Cough medicine  with dextromethorphan (DM) should be avoided. Drinking lots of fluids can help loosen the mucus too, especially warm fluids. ~ 02/29/2012 3:11:36PM Page 1 of 1 CAN_[email or web address]_[email or web address]

## 2012-02-29 NOTE — Telephone Encounter (Signed)
I called home and cell to get update from patient.  LMOVM x2.  Please try to call tomorrow to get more info on patient's status.  In meantime, I agree with the plan below.

## 2012-03-01 NOTE — Telephone Encounter (Signed)
Patient returned physicans call.  Pt has a bad cold no fever. 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 Mrs Leslie Wong says she can not leave her daughter, and she does not want to give it to her daughter.  Pharmacy is [facility] in [place] --[phone]. Call back # for pt [phone].

## 2012-03-01 NOTE — Telephone Encounter (Signed)
Detailed message left on VM.

## 2012-03-01 NOTE — Telephone Encounter (Signed)
If she isn't SOB or febrile, then I would not start abx.  She should cover her cough, wash her hands, and seek eval when possible.

## 2012-03-06 ENCOUNTER — Ambulatory Visit: Payer: Self-pay | Admitting: Internal Medicine

## 2012-03-06 DIAGNOSIS — J40 Bronchitis, not specified as acute or chronic: Secondary | ICD-10-CM | POA: Diagnosis not present

## 2012-03-13 ENCOUNTER — Other Ambulatory Visit: Payer: Self-pay | Admitting: *Deleted

## 2012-03-14 MED ORDER — TAPENTADOL HCL 50 MG PO TABS: ORAL_TABLET | ORAL | Status: DC

## 2012-03-14 NOTE — Telephone Encounter (Signed)
Patient picked up the prescription.

## 2012-03-14 NOTE — Telephone Encounter (Signed)
Please call in

## 2012-04-02 DIAGNOSIS — F3160 Bipolar disorder, current episode mixed, unspecified: Secondary | ICD-10-CM | POA: Diagnosis not present

## 2012-04-05 DIAGNOSIS — H40009 Preglaucoma, unspecified, unspecified eye: Secondary | ICD-10-CM | POA: Diagnosis not present

## 2012-04-17 DIAGNOSIS — Z872 Personal history of diseases of the skin and subcutaneous tissue: Secondary | ICD-10-CM | POA: Diagnosis not present

## 2012-04-17 DIAGNOSIS — D485 Neoplasm of uncertain behavior of skin: Secondary | ICD-10-CM | POA: Diagnosis not present

## 2012-04-17 DIAGNOSIS — L570 Actinic keratosis: Secondary | ICD-10-CM | POA: Diagnosis not present

## 2012-04-17 DIAGNOSIS — D235 Other benign neoplasm of skin of trunk: Secondary | ICD-10-CM | POA: Diagnosis not present

## 2012-04-17 DIAGNOSIS — T148 Other injury of unspecified body region: Secondary | ICD-10-CM | POA: Diagnosis not present

## 2012-04-17 DIAGNOSIS — L738 Other specified follicular disorders: Secondary | ICD-10-CM | POA: Diagnosis not present

## 2012-04-17 DIAGNOSIS — W57XXXA Bitten or stung by nonvenomous insect and other nonvenomous arthropods, initial encounter: Secondary | ICD-10-CM | POA: Diagnosis not present

## 2012-04-24 ENCOUNTER — Other Ambulatory Visit: Payer: Self-pay

## 2012-04-24 MED ORDER — TAPENTADOL HCL 50 MG PO TABS: ORAL_TABLET | ORAL | Status: DC

## 2012-04-24 NOTE — Telephone Encounter (Signed)
Please give to pt.  

## 2012-04-24 NOTE — Telephone Encounter (Signed)
Pt request written rx Nucynta 50 mg. Pt last seen 01/02/12. When rx ready for pick up please call [phone].

## 2012-04-24 NOTE — Telephone Encounter (Signed)
Patient advised.

## 2012-04-27 DIAGNOSIS — D485 Neoplasm of uncertain behavior of skin: Secondary | ICD-10-CM | POA: Diagnosis not present

## 2012-05-19 ENCOUNTER — Ambulatory Visit: Payer: Self-pay | Admitting: Medical

## 2012-05-19 DIAGNOSIS — J40 Bronchitis, not specified as acute or chronic: Secondary | ICD-10-CM | POA: Diagnosis not present

## 2012-05-19 DIAGNOSIS — J329 Chronic sinusitis, unspecified: Secondary | ICD-10-CM | POA: Diagnosis not present

## 2012-05-19 LAB — RAPID INFLUENZA A&B ANTIGENS

## 2012-05-28 ENCOUNTER — Other Ambulatory Visit: Payer: Self-pay | Admitting: Family Medicine

## 2012-05-28 NOTE — Telephone Encounter (Deleted)
Patient needs written Rx for pickup.

## 2012-05-28 NOTE — Telephone Encounter (Signed)
Refill request for Nucynta.  Patient said she needs written refill and it can't be called in.

## 2012-05-29 MED ORDER — TAPENTADOL HCL 50 MG PO TABS: ORAL_TABLET | ORAL | Status: DC

## 2012-05-29 NOTE — Telephone Encounter (Signed)
Patient advised.  Rx left at front desk for pick up. 

## 2012-05-29 NOTE — Telephone Encounter (Signed)
Printed, please give to patient.  

## 2012-05-31 DIAGNOSIS — L738 Other specified follicular disorders: Secondary | ICD-10-CM | POA: Diagnosis not present

## 2012-05-31 DIAGNOSIS — L821 Other seborrheic keratosis: Secondary | ICD-10-CM | POA: Diagnosis not present

## 2012-06-04 ENCOUNTER — Telehealth: Payer: Self-pay | Admitting: Family Medicine

## 2012-06-04 ENCOUNTER — Other Ambulatory Visit: Payer: Self-pay | Admitting: Family Medicine

## 2012-06-04 DIAGNOSIS — F319 Bipolar disorder, unspecified: Secondary | ICD-10-CM | POA: Diagnosis not present

## 2012-06-04 NOTE — Telephone Encounter (Signed)
Patient scheduled for follow up from Psych on Tuesday, June 05, 2012.

## 2012-06-04 NOTE — Progress Notes (Signed)
Opened in error

## 2012-06-04 NOTE — Telephone Encounter (Signed)
LMOVM of home and cell number to return call.

## 2012-06-04 NOTE — Telephone Encounter (Signed)
Dr.Namdari from [facility] called.  Pt with calcium 11.1.  Is on lithium.  Will need hypercalcemia workup.  Please call pt to make an OV here.  Thanks.   For our records, Dr. Tresea Mall fax is [phone].

## 2012-06-05 ENCOUNTER — Encounter: Payer: Self-pay | Admitting: Family Medicine

## 2012-06-05 ENCOUNTER — Ambulatory Visit (INDEPENDENT_AMBULATORY_CARE_PROVIDER_SITE_OTHER): Payer: Medicare Other | Admitting: Family Medicine

## 2012-06-05 DIAGNOSIS — W57XXXA Bitten or stung by nonvenomous insect and other nonvenomous arthropods, initial encounter: Secondary | ICD-10-CM | POA: Insufficient documentation

## 2012-06-05 DIAGNOSIS — T148XXA Other injury of unspecified body region, initial encounter: Secondary | ICD-10-CM | POA: Diagnosis not present

## 2012-06-05 DIAGNOSIS — T148 Other injury of unspecified body region: Secondary | ICD-10-CM | POA: Diagnosis not present

## 2012-06-05 LAB — COMPREHENSIVE METABOLIC PANEL
Albumin: 4.3 g/dL (ref 3.5–5.2)
BUN: 6 mg/dL (ref 6–23)
CO2: 30 mEq/L (ref 19–32)
Calcium: 10.4 mg/dL (ref 8.4–10.5)
Chloride: 109 mEq/L (ref 96–112)
GFR: 69.48 mL/min (ref >60.00)
Glucose, Bld: 82 mg/dL (ref 70–99)
Potassium: 4.3 mEq/L (ref 3.5–5.1)
Total Protein: 7.4 g/dL (ref 6.0–8.3)

## 2012-06-05 MED ORDER — ONDANSETRON 4 MG PO TBDP: 4.0000 mg | ORAL_TABLET | Freq: Every day | ORAL | Status: DC | PRN

## 2012-06-05 MED ORDER — LEVOTHYROXINE SODIUM 88 MCG PO TABS: 88.0000 ug | ORAL_TABLET | Freq: Every day | ORAL | Status: DC

## 2012-06-05 NOTE — Assessment & Plan Note (Signed)
Hasn't fully healed but no FB seen today on magnification.   Should heal gradually.

## 2012-06-05 NOTE — Progress Notes (Signed)
Irritated area, <1cm diameter, lateral L ankle, prev tick bite there.  Prev eval'd by derm and told to use vanos topically.    Calcium 11.1 yesterday at [facility].  Had run in high normal range prev.  On lithium and MVI.  No h/o renal stones.  No recent fractures.  Needs repeat labs.  PTH was prev mildly elevated.    Meds, vitals, and allergies reviewed.   ROS: See HPI.  Otherwise, noncontributory.  nad ncat Neck supple w/o TMG rrr ctab L lateral ankle with <1cm irritated lesion.  No FB seen.  No purulent discharge.

## 2012-06-05 NOTE — Assessment & Plan Note (Signed)
Recheck today with albumin and PTH. See notes on labs.

## 2012-06-05 NOTE — Patient Instructions (Signed)
Go to the lab on the way out.  We'll contact you with your lab report. Take care.   

## 2012-06-07 ENCOUNTER — Encounter: Payer: Self-pay | Admitting: Family Medicine

## 2012-06-07 LAB — PTH, INTACT AND CALCIUM
Calcium, Total (PTH): 10.8 mg/dL — ABNORMAL HIGH (ref 8.4–10.5)
PTH: 83.1 pg/mL — ABNORMAL HIGH (ref 14.0–72.0)

## 2012-06-19 ENCOUNTER — Encounter: Payer: Self-pay | Admitting: Family Medicine

## 2012-07-06 ENCOUNTER — Telehealth: Payer: Self-pay | Admitting: Family Medicine

## 2012-07-06 MED ORDER — TAPENTADOL HCL 50 MG PO TABS: ORAL_TABLET | ORAL | Status: DC

## 2012-07-06 NOTE — Telephone Encounter (Signed)
Called pt and told her the prescription for Nucynta she requested was ready to be picked up at the front office; Pt agreed

## 2012-07-06 NOTE — Telephone Encounter (Signed)
Pt is needing refill on Nucynta 50 mg.

## 2012-07-06 NOTE — Telephone Encounter (Signed)
Printed, please give to patient.  

## 2012-07-09 DIAGNOSIS — F319 Bipolar disorder, unspecified: Secondary | ICD-10-CM | POA: Diagnosis not present

## 2012-07-24 ENCOUNTER — Ambulatory Visit (INDEPENDENT_AMBULATORY_CARE_PROVIDER_SITE_OTHER): Payer: Medicare Other | Admitting: Family Medicine

## 2012-07-24 ENCOUNTER — Encounter: Payer: Self-pay | Admitting: Family Medicine

## 2012-07-24 VITALS — BP 124/80 | HR 87 | Temp 98.1°F | Wt 119.0 lb

## 2012-07-24 DIAGNOSIS — M549 Dorsalgia, unspecified: Secondary | ICD-10-CM

## 2012-07-24 DIAGNOSIS — G8929 Other chronic pain: Secondary | ICD-10-CM

## 2012-07-24 MED ORDER — ONDANSETRON 4 MG PO TBDP: 4.0000 mg | ORAL_TABLET | Freq: Every day | ORAL | Status: DC | PRN

## 2012-07-24 MED ORDER — LEVOTHYROXINE SODIUM 88 MCG PO TABS: 88.0000 ug | ORAL_TABLET | Freq: Every day | ORAL | Status: DC

## 2012-07-24 NOTE — Progress Notes (Signed)
Here with daughter today.  Was in [place] with family recently and her daughter heard her moving around midnight.  Daughter heard her again at ~2AM and patient was in the kitchen. Daughter went back to bed.  At 5 AM, daughter heard her in the kitchen again and had pulled a lot of pots and pans out of the cabinet.  She didn't seem organized with the activity.  She doesn't remember the event.  She finally went to bed.  She was drowsy the next AM when they tried to wake her.  Per pupils were dilated according to family and that gradually resolved.  She was moving and speaking slowly the next AM.  Her words were intelligible but the sentences didn't make sense.   Last dose was on 07/14/12.  Back pain increased in meantime.  We had tried to avoid opiates prev.    I hadn't seen this behavior at prev office visits and had no knowledge of this before today.  Is seeing Dr. Joaquim Lai- psychiatrist at [facility].  Had seen pain clinic 10+ years ago.    She turned in last (unfilled) paper rx for nucynta and this was shredded in front of patient.    Still hurting in a band across her lower back bilaterally.    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  MR report from 04/28/10 reviewed.   Meds, vitals, and allergies reviewed.   ROS: See HPI.  Otherwise, noncontributory.  nad ncat rrr ctab Back diffusely ttp across the lumbar area w/o focal weakness in the BLE.  Gait wnl, no limp, no foot drop.  Sensation intact in the BLE

## 2012-07-24 NOTE — Patient Instructions (Addendum)
Don't take anything other than tylenol for pain now and we'll be in touch.

## 2012-07-25 ENCOUNTER — Telehealth: Payer: Self-pay | Admitting: Family Medicine

## 2012-07-25 DIAGNOSIS — M549 Dorsalgia, unspecified: Secondary | ICD-10-CM

## 2012-07-25 MED ORDER — HYDROCODONE-ACETAMINOPHEN 5-325 MG PO TABS: 0.5000 | ORAL_TABLET | Freq: Three times a day (TID) | ORAL | Status: DC | PRN

## 2012-07-25 NOTE — Telephone Encounter (Signed)
Patient notified as instructed by telephone. Medication phoned to [facility] pharmacy as instructed. Pt will wait to hear from pt care coordinator about referral. Pt will call with update next week. Left v/m at Dr Tresea Mall office to call our office for update.

## 2012-07-25 NOTE — Telephone Encounter (Signed)
Notify Dr. Joaquim Lai- psychiatrist at [facility].  Pt had altered mental status on nucynta.  Resolved off the medicine.  Will change to vicodin for pain control and have patient referred to neurosurgery to see what other options are possible.    Call patient about this.  Vicodin appears to be the safest option for now.  Please call in rx for vicodin.  Sedation caution.  Monitor for changes in mentation.  Referral for neurosurgery is in.  Thanks.  Have her call back with update on pain control next week.  Star with 1/2 -1 tab tid.  Thanks.  Don't take extra tylenol along with the vicodin.

## 2012-07-25 NOTE — Telephone Encounter (Signed)
Dr Joaquim Lai notified as instructed by telephone.

## 2012-07-25 NOTE — Assessment & Plan Note (Signed)
>  25 min spent with face to face with patient, >50% counseling and/or coordinating care.  Will notify psych clinic at [facility] re: meds.  Has tolerated opiates in past.  She does have documented L spine pathology.  Most options for oral meds other than opiates are not possible due to prev intolerance.  Advised pt at OV to use tylenol.  I have reviewed the case.  Most reasonable to start low dose vicodin, monitor mentation, and refer to neurosurgery to see what can be done otherwise.  See following phone note.

## 2012-08-03 DIAGNOSIS — F0390 Unspecified dementia without behavioral disturbance: Secondary | ICD-10-CM | POA: Diagnosis not present

## 2012-08-03 DIAGNOSIS — R1310 Dysphagia, unspecified: Secondary | ICD-10-CM | POA: Diagnosis not present

## 2012-08-03 DIAGNOSIS — R634 Abnormal weight loss: Secondary | ICD-10-CM | POA: Diagnosis not present

## 2012-08-14 ENCOUNTER — Telehealth: Payer: Self-pay | Admitting: Family Medicine

## 2012-08-14 NOTE — Telephone Encounter (Signed)
Patient advised.

## 2012-08-14 NOTE — Telephone Encounter (Signed)
[facility] called because your patient no showed for her appt with Dr Venetia Maxon Neurosurgeon and they wanted you to know. Appt was made for yesterday, 08/13/12.

## 2012-08-14 NOTE — Telephone Encounter (Signed)
Notify pt or send her a letter.  She'll need to call the neurosurgery clinic and make arrangements with them.

## 2012-08-22 DIAGNOSIS — L988 Other specified disorders of the skin and subcutaneous tissue: Secondary | ICD-10-CM | POA: Diagnosis not present

## 2012-08-22 DIAGNOSIS — L578 Other skin changes due to chronic exposure to nonionizing radiation: Secondary | ICD-10-CM | POA: Diagnosis not present

## 2012-08-22 DIAGNOSIS — D485 Neoplasm of uncertain behavior of skin: Secondary | ICD-10-CM | POA: Diagnosis not present

## 2012-08-22 DIAGNOSIS — B079 Viral wart, unspecified: Secondary | ICD-10-CM | POA: Diagnosis not present

## 2012-08-23 ENCOUNTER — Other Ambulatory Visit: Payer: Self-pay | Admitting: *Deleted

## 2012-08-23 NOTE — Telephone Encounter (Signed)
Faxed refill request   

## 2012-08-23 NOTE — Telephone Encounter (Signed)
Deny this, at least for now.  She was to f/u with neurosurgery clinic.  I need to know what has happened with this.  It was reported that she no showed with the appointment.

## 2012-08-24 MED ORDER — HYDROCODONE-ACETAMINOPHEN 5-325 MG PO TABS: 0.5000 | ORAL_TABLET | Freq: Three times a day (TID) | ORAL | Status: DC | PRN

## 2012-08-24 NOTE — Telephone Encounter (Signed)
Patient advised.  She says she was sick all night and called to leave a message but the office reported her as a "No Show".  I told her previously when I spoke with her about the "No Show" that she needed to reschedule but as of now, she has not.

## 2012-08-24 NOTE — Telephone Encounter (Signed)
She needs to call neurosurgery and schedule the f/u.  Please call in. Thanks.

## 2012-08-24 NOTE — Telephone Encounter (Signed)
Medication phoned to pharmacy. Patient advised.  

## 2012-09-04 DIAGNOSIS — H60509 Unspecified acute noninfective otitis externa, unspecified ear: Secondary | ICD-10-CM | POA: Diagnosis not present

## 2012-09-04 DIAGNOSIS — H6980 Other specified disorders of Eustachian tube, unspecified ear: Secondary | ICD-10-CM | POA: Diagnosis not present

## 2012-09-07 ENCOUNTER — Telehealth: Payer: Self-pay | Admitting: Family Medicine

## 2012-09-07 NOTE — Telephone Encounter (Signed)
Pt is calling needing copies of her MRI that she has had in the past and she says she spoke with the hospital and they told her they would send the disk here. I looked in her chart and didn't see anything ??? I told her I could mail her copies of the paper information we had but I wasn't sure about the disk because pt's generally go to where they had the procedure done to get those. Pt would like them mailed to her if we do have them.

## 2012-09-09 NOTE — Telephone Encounter (Signed)
The disk comes from the originating clinic/imaging suite.  The MRI report is from 04/28/10.  You can send that if requested.

## 2012-09-10 ENCOUNTER — Encounter: Payer: Self-pay | Admitting: *Deleted

## 2012-09-10 NOTE — Telephone Encounter (Signed)
Copy of MRI mailed to patient along with a note stating that she will need to get the disk from the originating clinic/imaging facility.

## 2012-09-11 DIAGNOSIS — L259 Unspecified contact dermatitis, unspecified cause: Secondary | ICD-10-CM | POA: Diagnosis not present

## 2012-09-20 DIAGNOSIS — R21 Rash and other nonspecific skin eruption: Secondary | ICD-10-CM | POA: Diagnosis not present

## 2012-09-20 DIAGNOSIS — L821 Other seborrheic keratosis: Secondary | ICD-10-CM | POA: Diagnosis not present

## 2012-09-25 ENCOUNTER — Other Ambulatory Visit: Payer: Self-pay | Admitting: *Deleted

## 2012-09-25 NOTE — Telephone Encounter (Signed)
Received faxed refill request from pharmacy for Hydrocodone-Acetaminophen 5-325, take 1/2 to 1 tablet by mouth every 8 hours as needed for pain. Medication is no longer on current med list. Last office visit 07/24/12. Is it okay to refill medication?

## 2012-09-26 ENCOUNTER — Telehealth: Payer: Self-pay | Admitting: Family Medicine

## 2012-09-26 MED ORDER — HYDROCODONE-ACETAMINOPHEN 5-325 MG PO TABS: 0.5000 | ORAL_TABLET | Freq: Three times a day (TID) | ORAL | Status: DC | PRN

## 2012-09-26 NOTE — Telephone Encounter (Signed)
See following note. 

## 2012-09-26 NOTE — Telephone Encounter (Signed)
Rx called to pharmacy. Patient notified as instructed by telephone. 

## 2012-09-26 NOTE — Telephone Encounter (Signed)
Caller: Bunny/Patient; Phone: [phone]; Reason for Call: Patient calling to get a refill on her Vicodin, states she has been out for 3 days.  Does not see the Neurologist til 10/01/12.  Please call her back.  Thanks

## 2012-09-26 NOTE — Telephone Encounter (Signed)
Please call in Rx and notify pt.  I got the request yesterday.  We needs 24 hours to process the refill.  She needs to keep the OV with neurosurgery.  Thanks.

## 2012-09-27 DIAGNOSIS — L570 Actinic keratosis: Secondary | ICD-10-CM | POA: Diagnosis not present

## 2012-09-27 DIAGNOSIS — R21 Rash and other nonspecific skin eruption: Secondary | ICD-10-CM | POA: Diagnosis not present

## 2012-09-27 DIAGNOSIS — L500 Allergic urticaria: Secondary | ICD-10-CM | POA: Diagnosis not present

## 2012-09-27 DIAGNOSIS — L723 Sebaceous cyst: Secondary | ICD-10-CM | POA: Diagnosis not present

## 2012-10-01 DIAGNOSIS — M47817 Spondylosis without myelopathy or radiculopathy, lumbosacral region: Secondary | ICD-10-CM | POA: Diagnosis not present

## 2012-10-01 DIAGNOSIS — M5126 Other intervertebral disc displacement, lumbar region: Secondary | ICD-10-CM | POA: Diagnosis not present

## 2012-10-01 DIAGNOSIS — M5137 Other intervertebral disc degeneration, lumbosacral region: Secondary | ICD-10-CM | POA: Diagnosis not present

## 2012-10-01 DIAGNOSIS — M545 Low back pain: Secondary | ICD-10-CM | POA: Diagnosis not present

## 2012-10-03 ENCOUNTER — Other Ambulatory Visit: Payer: Self-pay | Admitting: *Deleted

## 2012-10-03 MED ORDER — OMEPRAZOLE 20 MG PO CPDR: DELAYED_RELEASE_CAPSULE | ORAL | Status: DC

## 2012-10-04 ENCOUNTER — Telehealth: Payer: Self-pay

## 2012-10-04 ENCOUNTER — Ambulatory Visit (INDEPENDENT_AMBULATORY_CARE_PROVIDER_SITE_OTHER): Payer: Medicare Other | Admitting: Family Medicine

## 2012-10-04 ENCOUNTER — Encounter: Payer: Self-pay | Admitting: Family Medicine

## 2012-10-04 VITALS — BP 122/86 | HR 83 | Temp 98.4°F | Wt 118.8 lb

## 2012-10-04 DIAGNOSIS — K5289 Other specified noninfective gastroenteritis and colitis: Secondary | ICD-10-CM

## 2012-10-04 DIAGNOSIS — K529 Noninfective gastroenteritis and colitis, unspecified: Secondary | ICD-10-CM

## 2012-10-04 NOTE — Progress Notes (Signed)
Off lithium for 2 days but taking other meds.  Sx started yesterday.  She was vomiting, then had some diarrhea.  She saw the wellbutrin shell in the toilet and this worried her.  Then last night she was alternating with sweats/fever and chills.  No vomiting or diarrhea today.  Has been able to eat today, some chicken and drank some water.  Sweats/fever and chills improved today.    Her son had a court date yesterday and she is worried about that.   She had f/u with dermatology about a rash, likely plant dermatitis that is resolving.  Was on hydroxyzine for the itching prev. She had a dry mouth with that.    She saw Dr. Venetia Maxon 3 days ago about her back.  Awaiting records.   Meds, vitals, and allergies reviewed.   ROS: See HPI.  Otherwise, noncontributory.  nad but slightly worried appearing Speech slightly pressured but she can maintain a conversation Judgement appears intact No SI/HI Tm wnl Nasal and OP wnl Neck supple rrr ctab Abd soft, not ttp normal BS, no rebound Ext w/o edema, ext well perfused

## 2012-10-04 NOTE — Telephone Encounter (Signed)
Pt walked in; 10/02/12 feels hot then cold sweats on and off. Pt had panic attack early AM. pts appearance now panic episode resolved. Pt wants labs done for auto immune system and full blood panel. Pt not taken Lithium since 10/02/12 due to nausea; no vomiting. Pts mouth is dry and had diarrhea x 5 on 10/03/12; no diarrhea today. Pt scheduled 30 min appt with Dr Para March today at 4pm. Advised if need be seen sooner go to UC.

## 2012-10-04 NOTE — Patient Instructions (Addendum)
It appears that you have a "stomach bug".  These are usually viral and should get better in a few days.  Take the zofran for nausea and drink plenty of fluids.  The sweats and chills are more likely to happen at night but should get better.  Take care.

## 2012-10-05 DIAGNOSIS — K529 Noninfective gastroenteritis and colitis, unspecified: Secondary | ICD-10-CM | POA: Insufficient documentation

## 2012-10-05 NOTE — Assessment & Plan Note (Signed)
Improved today, nontoxic.  sweats/fever and chills likely from the same process. Should resolve.  Not dehydrated appearing.  Use zofran, drink plenty of fluids and restart lithium. She agrees.

## 2012-10-08 DIAGNOSIS — H903 Sensorineural hearing loss, bilateral: Secondary | ICD-10-CM | POA: Diagnosis not present

## 2012-10-08 DIAGNOSIS — J069 Acute upper respiratory infection, unspecified: Secondary | ICD-10-CM | POA: Diagnosis not present

## 2012-10-08 DIAGNOSIS — H9209 Otalgia, unspecified ear: Secondary | ICD-10-CM | POA: Diagnosis not present

## 2012-10-08 DIAGNOSIS — H832X9 Labyrinthine dysfunction, unspecified ear: Secondary | ICD-10-CM | POA: Diagnosis not present

## 2012-10-25 ENCOUNTER — Telehealth: Payer: Self-pay | Admitting: *Deleted

## 2012-10-25 ENCOUNTER — Other Ambulatory Visit: Payer: Self-pay | Admitting: *Deleted

## 2012-10-25 MED ORDER — VALACYCLOVIR HCL 500 MG PO TABS: 500.0000 mg | ORAL_TABLET | Freq: Two times a day (BID) | ORAL | Status: DC | PRN

## 2012-10-25 NOTE — Telephone Encounter (Signed)
Patient is asking for [facility] Mouthwash Refill  Swish and spit as needed 2 Tbsp. (30 mL) but I do not see the original Rx.  Please advise.  [facility], [place]

## 2012-10-25 NOTE — Telephone Encounter (Signed)
Sent!

## 2012-10-26 ENCOUNTER — Other Ambulatory Visit: Payer: Self-pay | Admitting: *Deleted

## 2012-10-26 MED ORDER — HYDROCODONE-ACETAMINOPHEN 5-325 MG PO TABS: 0.5000 | ORAL_TABLET | Freq: Three times a day (TID) | ORAL | Status: AC | PRN

## 2012-10-26 MED ORDER — FIRST-DUKES MOUTHWASH MT SUSP: 30.0000 mL | Freq: Two times a day (BID) | OROMUCOSAL | Status: DC | PRN

## 2012-10-26 NOTE — Progress Notes (Signed)
Medication phoned to pharmacy.  

## 2012-10-26 NOTE — Telephone Encounter (Signed)
Medication sent to pharmacy electronically.  

## 2012-10-26 NOTE — Progress Notes (Signed)
Please call in vicodin.

## 2012-10-26 NOTE — Telephone Encounter (Signed)
Please call in. Use bid prn. Disp , 1rf.  Thanks.

## 2012-10-29 DIAGNOSIS — F319 Bipolar disorder, unspecified: Secondary | ICD-10-CM | POA: Diagnosis not present

## 2012-11-15 ENCOUNTER — Other Ambulatory Visit: Payer: Self-pay | Admitting: *Deleted

## 2012-11-15 MED ORDER — LEVOTHYROXINE SODIUM 88 MCG PO TABS: 88.0000 ug | ORAL_TABLET | Freq: Every day | ORAL | Status: DC

## 2012-11-26 ENCOUNTER — Other Ambulatory Visit: Payer: Self-pay | Admitting: Family Medicine

## 2012-11-27 ENCOUNTER — Telehealth: Payer: Self-pay | Admitting: Family Medicine

## 2012-11-27 NOTE — Telephone Encounter (Signed)
Caller: Stephie/Patient; Phone: [phone]; Reason for Call: Patient calling in follow up regarding refill for vicodin 5/325.  States she called the office 11/26/12 for more Rx.  Per Epic, no notes seen in Epic.  No active Rx for vicodin in system.  Info to office for provider/staff review/callback.   May reach patient at [phone].

## 2012-11-27 NOTE — Telephone Encounter (Signed)
Electronic refill request.  Please advise. 

## 2012-11-28 NOTE — Telephone Encounter (Signed)
Rx called to pharmacy as instructed. 

## 2012-11-28 NOTE — Telephone Encounter (Signed)
Please call in

## 2012-11-28 NOTE — Telephone Encounter (Signed)
Rx called to pharmacy as instructed. Patient notified by telephone. 

## 2012-11-28 NOTE — Telephone Encounter (Signed)
See refill request.  Please call in.

## 2012-12-26 ENCOUNTER — Other Ambulatory Visit: Payer: Self-pay | Admitting: Family Medicine

## 2012-12-27 NOTE — Telephone Encounter (Signed)
Electronic refill request.  Please advise.  A duplicate request was sent.  The second has been denied because of duplication.  Please advise.

## 2012-12-28 NOTE — Telephone Encounter (Signed)
Please call in.  Thanks.   

## 2012-12-28 NOTE — Telephone Encounter (Signed)
Rx phoned to pharmacy.  

## 2013-01-05 ENCOUNTER — Ambulatory Visit: Payer: Self-pay | Admitting: Emergency Medicine

## 2013-01-05 DIAGNOSIS — Z90710 Acquired absence of both cervix and uterus: Secondary | ICD-10-CM | POA: Diagnosis not present

## 2013-01-05 DIAGNOSIS — R05 Cough: Secondary | ICD-10-CM | POA: Diagnosis not present

## 2013-01-05 DIAGNOSIS — Z79899 Other long term (current) drug therapy: Secondary | ICD-10-CM | POA: Diagnosis not present

## 2013-01-05 DIAGNOSIS — J189 Pneumonia, unspecified organism: Secondary | ICD-10-CM | POA: Diagnosis not present

## 2013-01-05 DIAGNOSIS — E039 Hypothyroidism, unspecified: Secondary | ICD-10-CM | POA: Diagnosis not present

## 2013-01-05 DIAGNOSIS — Z9889 Other specified postprocedural states: Secondary | ICD-10-CM | POA: Diagnosis not present

## 2013-01-05 LAB — CBC WITH DIFFERENTIAL/PLATELET
Basophil #: 0 10*3/uL (ref 0.0–0.1)
Basophil %: 0.3 %
Eosinophil #: 0.2 10*3/uL (ref 0.0–0.7)
Eosinophil %: 1.2 %
HCT: 41.1 % (ref 35.0–47.0)
HGB: 14 g/dL (ref 12.0–16.0)
Lymphocyte #: 0.7 10*3/uL — ABNORMAL LOW (ref 1.0–3.6)
Lymphocyte %: 5.3 %
MCH: 32.7 pg (ref 26.0–34.0)
MCHC: 34 g/dL (ref 32.0–36.0)
MCV: 96 fL (ref 80–100)
Monocyte #: 1.1 x10 3/mm — ABNORMAL HIGH (ref 0.2–0.9)
Monocyte %: 8.9 %
Neutrophil #: 10.4 10*3/uL — ABNORMAL HIGH (ref 1.4–6.5)
Neutrophil %: 84.3 %
Platelet: 245 10*3/uL (ref 150–440)
RBC: 4.27 10*6/uL (ref 3.80–5.20)
RDW: 13.5 % (ref 11.5–14.5)
WBC: 12.3 10*3/uL — ABNORMAL HIGH (ref 3.6–11.0)

## 2013-01-05 LAB — RAPID INFLUENZA A&B ANTIGENS

## 2013-01-06 ENCOUNTER — Other Ambulatory Visit: Payer: Self-pay | Admitting: Family Medicine

## 2013-01-11 ENCOUNTER — Telehealth: Payer: Self-pay | Admitting: Family Medicine

## 2013-01-11 NOTE — Telephone Encounter (Signed)
Patient was seen at Urgent Care on 01/05/13.  Given antibiotics due to being dx with pneumonia.  Patient states she is still having fever and is hurting all over.  She wanted a refill on her antibiotics.  I spoke with Dr. Para March and he wanted the patient to be seen and did not refill her abx.  Patient said she could not get to the Saturday clinic to be seen (01/12/13) because she cannot move off the couch and she does not have a way to get there.  I suggested she could call for ambulance transport to the hospital to get medical care.  Made sure she understood that Dr. Para March needed to recheck her to see why the abx were not helping.

## 2013-01-11 NOTE — Telephone Encounter (Signed)
Patient Information:  Caller Name: Kisa  Phone: [phone]  Patient: Leslie Wong  Gender: Female  DOB: 28-Sep-1956  Age: 57 Years  PCP: Crawford Givens Clelia Croft) [facility])  Pregnant: No  Office Follow Up:  Does the office need to follow up with this patient?: No  Instructions For The Office: N/A  RN Note:  Patient was dx with Pneumonia by Urgent Care and is not getting better. She called earlier for another antibiotic, she was already informed she would need to be evaluated. Pt states she does not have transportation. She was already instructed to call for EMS transport if she could not get transportation and felt to weak to get around. Pt given these recommendations again, she did not verbalize what decision she will make.  Symptoms  Reason For Call & Symptoms: weak, fever, cough, wheezing, chest pain when she coughs  Reviewed Health History In EMR: Yes  Reviewed Medications In EMR: Yes  Reviewed Allergies In EMR: Yes  Reviewed Surgeries / Procedures: Yes  Date of Onset of Symptoms: 01/05/2013  Treatments Tried: Tylenol, antibiotic, inhaler  Treatments Tried Worked: No  Any Fever: Yes  Fever Taken: Oral  Fever Time Of Reading: 16:00:00  Fever Last Reading: 100 OB / GYN:  LMP: Unknown  Guideline(s) Used:  Cough  Disposition Per Guideline:   Go to Office Now  Reason For Disposition Reached:   Wheezing is present  Advice Given:  Cough Medicines:  OTC Cough Syrups: The most common cough suppressant in OTC cough medications is dextromethorphan. Often the letters "DM" appear in the name.  OTC Cough Drops: Cough drops can help a lot, especially for mild coughs. They reduce coughing by soothing your irritated throat and removing that tickle sensation in the back of the throat. Cough drops also have the advantage of portability - you can carry them with you.  Home Remedy - Honey: This old home remedy has been shown to help decrease coughing at night. The adult dosage  is 2 teaspoons (10 ml) at bedtime. Honey should not be given to infants under one year of age.  Coughing Spasms:  Drink warm fluids. Inhale warm mist (Reason: both relax the airway and loosen up the phlegm).  Prevent Dehydration:  Drink adequate liquids.  Avoid Tobacco Smoke:  Smoking or being exposed to smoke makes coughs much worse.  Call Back If:  Difficulty breathing occurs  Patient Refused Recommendation:  Patient Refused Care Advice  Pt already informed she needs evaluation. She stated she already talked to nurse, call must have been in twice but she did want triage again. Emphasized same recommendations that pt needs to be seen.

## 2013-01-11 NOTE — Telephone Encounter (Signed)
If she is sick to the point of being unable to care for/transport herself, then I agree with the ER eval.

## 2013-01-12 NOTE — Telephone Encounter (Signed)
Pt has been prev advised about EMS/ER/911 if not able to transport herself.

## 2013-01-16 ENCOUNTER — Ambulatory Visit (INDEPENDENT_AMBULATORY_CARE_PROVIDER_SITE_OTHER): Payer: Medicare Other | Admitting: Family Medicine

## 2013-01-16 ENCOUNTER — Encounter: Payer: Self-pay | Admitting: Family Medicine

## 2013-01-16 VITALS — BP 120/82 | HR 95 | Temp 97.9°F | Wt 119.0 lb

## 2013-01-16 DIAGNOSIS — R05 Cough: Secondary | ICD-10-CM

## 2013-01-16 DIAGNOSIS — M25519 Pain in unspecified shoulder: Secondary | ICD-10-CM | POA: Diagnosis not present

## 2013-01-16 MED ORDER — HYDROCODONE-ACETAMINOPHEN 5-325 MG PO TABS: ORAL_TABLET | ORAL | Status: DC

## 2013-01-16 NOTE — Progress Notes (Signed)
01/03/13- sx started.  Son was sick concurrently.  Chest was tight, went to UC.  Flu neg.  No PNA on CXR.  She was "hurting to the bones all over".  Started on albuterol and levaquin x10d.  UC notes reviewed.   Now with temps improved, prev 103, normal today.  Breathing is better but pain with deep breath.  No known wheeze.  SABA help with dyspnea, temporarily.  No sputum now, resolved from prev.  Some diarrhea in the last 24 hours.  Appetite is decreased but still drinking plenty of fluids.  The aches are better but not resolved fully.    B shoulder pain is worse since the illness.  She has pain B lifting above her head.  No trauma.  vicodin had helped the pain; she was normally taking 2 a day but has needed a 3rd dose due to shoulder pain in addition to her back pain.    She quit smoking.    Meds, vitals, and allergies reviewed.   ROS: See HPI.  Otherwise, noncontributory.  nad ncat Tm wnl Nasal exam slightly stuffy, no erythema MMM, no erythema Neck supple, no LA rrr ctab but dry cough and UAN noted abd soft Ext w/o edema B shoulder pain with ROM, esp overhead.  Pain with int/ext rotation, but no arm drop.  + scap assist with ROM

## 2013-01-16 NOTE — Patient Instructions (Addendum)
Keep using the inhaler as needed for now and the cough should gradually get better.  Use the shoulder exercises.  Take the vicodin every 8 hours as needed for now.  If you aren't improving or if you continue to need the vicodin every 8 hours, we'll need to set you up with ortho.  Call back with an update in about 1 week.

## 2013-01-17 ENCOUNTER — Other Ambulatory Visit: Payer: Self-pay | Admitting: Family Medicine

## 2013-01-17 DIAGNOSIS — M25519 Pain in unspecified shoulder: Secondary | ICD-10-CM | POA: Insufficient documentation

## 2013-01-17 DIAGNOSIS — R05 Cough: Secondary | ICD-10-CM | POA: Insufficient documentation

## 2013-01-17 DIAGNOSIS — R059 Cough, unspecified: Secondary | ICD-10-CM | POA: Insufficient documentation

## 2013-01-17 NOTE — Telephone Encounter (Signed)
Electronic refill request.  Please advise. 

## 2013-01-17 NOTE — Assessment & Plan Note (Signed)
CXR w/o PNA, no fevers now and can continue SABA prn, she has rx for this.  UAN and cough should slowly resolve.  No indication for reimaging today or continued abx.  Nontoxic.  D/w pt.

## 2013-01-17 NOTE — Assessment & Plan Note (Signed)
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  Temp inc in vicodin to tid for now with new rx written.  Anatomy d/w pt along with home exercises.  If not improving, if continued tid vicodin use, we can set up ortho eval.  She agrees. Handout given re: cuff.

## 2013-01-18 NOTE — Telephone Encounter (Signed)
She was given rx 2 days ago at OV.  Deny this rx. She can fill the other rx.  Thanks.

## 2013-01-25 ENCOUNTER — Other Ambulatory Visit: Payer: Self-pay | Admitting: Family Medicine

## 2013-01-27 ENCOUNTER — Emergency Department: Payer: Self-pay | Admitting: Emergency Medicine

## 2013-01-27 DIAGNOSIS — Z9089 Acquired absence of other organs: Secondary | ICD-10-CM | POA: Diagnosis not present

## 2013-01-27 DIAGNOSIS — Z9079 Acquired absence of other genital organ(s): Secondary | ICD-10-CM | POA: Diagnosis not present

## 2013-01-27 DIAGNOSIS — R091 Pleurisy: Secondary | ICD-10-CM | POA: Diagnosis not present

## 2013-01-27 DIAGNOSIS — J209 Acute bronchitis, unspecified: Secondary | ICD-10-CM | POA: Diagnosis not present

## 2013-01-27 DIAGNOSIS — R071 Chest pain on breathing: Secondary | ICD-10-CM | POA: Diagnosis not present

## 2013-01-27 DIAGNOSIS — R079 Chest pain, unspecified: Secondary | ICD-10-CM | POA: Diagnosis not present

## 2013-01-27 DIAGNOSIS — R6889 Other general symptoms and signs: Secondary | ICD-10-CM | POA: Diagnosis not present

## 2013-01-27 DIAGNOSIS — J40 Bronchitis, not specified as acute or chronic: Secondary | ICD-10-CM | POA: Diagnosis not present

## 2013-01-27 DIAGNOSIS — F17200 Nicotine dependence, unspecified, uncomplicated: Secondary | ICD-10-CM | POA: Diagnosis not present

## 2013-01-27 LAB — CBC WITH DIFFERENTIAL/PLATELET
Basophil #: 0.1 10*3/uL (ref 0.0–0.1)
Basophil %: 0.5 %
HCT: 42.5 % (ref 35.0–47.0)
Lymphocyte #: 3.8 10*3/uL — ABNORMAL HIGH (ref 1.0–3.6)
Lymphocyte %: 29.7 %
Monocyte #: 0.9 x10 3/mm (ref 0.2–0.9)
Neutrophil %: 59.4 %
RBC: 4.32 10*6/uL (ref 3.80–5.20)
RDW: 13.8 % (ref 11.5–14.5)
WBC: 12.9 10*3/uL — ABNORMAL HIGH (ref 3.6–11.0)

## 2013-01-27 LAB — BASIC METABOLIC PANEL
Chloride: 107 mmol/L (ref 98–107)
Co2: 27 mmol/L (ref 21–32)
Creatinine: 0.88 mg/dL (ref 0.60–1.30)
EGFR (African American): >60
EGFR (Non-African Amer.): >60
Glucose: 102 mg/dL — ABNORMAL HIGH (ref 65–99)

## 2013-02-04 ENCOUNTER — Telehealth: Payer: Self-pay | Admitting: Family Medicine

## 2013-02-04 NOTE — Telephone Encounter (Signed)
Schedule her for tomorrow.

## 2013-02-04 NOTE — Telephone Encounter (Signed)
Will route to pcp 

## 2013-02-04 NOTE — Telephone Encounter (Signed)
Patient Information:  Caller Name: Adeola  Phone: [phone]  Patient: Leslie Wong, Leslie Wong  Gender: Female  DOB: Jun 06, 1956  Age: 57 Years  PCP: Crawford Givens Clelia Croft) [facility])  Pregnant: No  Office Follow Up:  Does the office need to follow up with this patient?: Yes  Instructions For The Office: Please review, contact patient at  [phone].  RN Note:  Patient out of area, recommended going to ER now.  Symptoms  Reason For Call & Symptoms: Dx pneumonia [facility] 01/05/2013, started Levaquin, Albuterol Inhaler, Tussinex.  Sx continued.  Seen in ER on 2/2 Dx Pleurisy.  Sx gradually worse since then, severe pain over chest and pain radiates to neck then down arm.  Taking Vicodin for pain and that helps some.  Pain increases if moving arm but constantly feels like lof of elephants sitting on chest at all times, has been severe today 2/10.  Reviewed Health History In EMR: Yes  Reviewed Medications In EMR: Yes  Reviewed Allergies In EMR: Yes  Reviewed Surgeries / Procedures: Yes  Date of Onset of Symptoms: 12/31/2012  Treatments Tried: Epsom salt baths, hot showers, Vicodin  Treatments Tried Worked: No OB / GYN:  LMP: Unknown  Guideline(s) Used:  Chest Pain  Disposition Per Guideline:   Go to ED Now  Reason For Disposition Reached:   Severe chest pain  Advice Given:  N/A  Patient Refused Recommendation:  Patient Refused Care Advice  Patient wanting to try to get thru the night then come to office in am 2/11. Is currenly caring for relative out of area.

## 2013-02-04 NOTE — Telephone Encounter (Signed)
If she is SOB or having that much pain, then I would advise ER now.

## 2013-02-04 NOTE — Telephone Encounter (Signed)
Patient reports she is not able to go anywhere today. She is caring for her daughter who has just been discharged from the hospital. Patient would be able to come in for an appointment tomorrow, 02/05/13. She was diagnosed with pleurisy last week and is having intermittent chest pain. She would like to know what to do next. Advised patient if she is having chest pain to go to the ER at this time. Patient refuses to do so and wants someone to call her back about scheduling an appointment.

## 2013-02-04 NOTE — Telephone Encounter (Signed)
Appt scheduled 02/05/13.

## 2013-02-05 ENCOUNTER — Ambulatory Visit (INDEPENDENT_AMBULATORY_CARE_PROVIDER_SITE_OTHER): Payer: Medicare Other | Admitting: Family Medicine

## 2013-02-05 ENCOUNTER — Encounter: Payer: Self-pay | Admitting: Family Medicine

## 2013-02-05 ENCOUNTER — Ambulatory Visit: Payer: Medicare Other | Admitting: Family Medicine

## 2013-02-05 VITALS — BP 124/82 | HR 92 | Temp 98.4°F | Wt 117.0 lb

## 2013-02-05 DIAGNOSIS — R091 Pleurisy: Secondary | ICD-10-CM | POA: Diagnosis not present

## 2013-02-05 MED ORDER — OXYCODONE-ACETAMINOPHEN 10-325 MG PO TABS: 1.0000 | ORAL_TABLET | Freq: Four times a day (QID) | ORAL | Status: DC | PRN

## 2013-02-05 NOTE — Patient Instructions (Addendum)
When you are feeling better, please call [facility] about the follow up MRI of your back.  Don't take the vicodin and oxycodone together.  Take the oxycodone for the pleurisy pain.  Take the least amount possible.   Take care.

## 2013-02-05 NOTE — Progress Notes (Signed)
Shoulder and chest continue to be sore.  She can get a deep breath but then feels a catch.  No fevers.  Coughing at night.  No sputum.  Done with abx.  Seen at [facility] 01/27/13, re-xrayed and dx'd with pleurisy and started on oxycodone with some relief.  She is intolerant of steroids.  Using SABA some with some relief.  The oxycodone helps her rest.  She has used 13 pills in in the interval, since 01/27/13.  Will run out soon.  Is not sedated.    Meds, vitals, and allergies reviewed.   ROS: See HPI.  Otherwise, noncontributory.  nad ncat Mmm rrr ctab Pain in chest with deep breath, not with compression of chest wall, no inc in wob, no focal dec in BS No bruising Ext well perfused .

## 2013-02-06 DIAGNOSIS — R091 Pleurisy: Secondary | ICD-10-CM | POA: Insufficient documentation

## 2013-02-06 NOTE — Assessment & Plan Note (Signed)
Requesting [facility] records.  Given another rx for oxycodone.  She has some relief with this and isn't sedated.  Advised not to mix with vicodin.  No sx suggestive of ACS or another alarming dx.  She agrees with plan.  Will update me as needed.

## 2013-02-18 DIAGNOSIS — H40009 Preglaucoma, unspecified, unspecified eye: Secondary | ICD-10-CM | POA: Diagnosis not present

## 2013-02-19 ENCOUNTER — Other Ambulatory Visit: Payer: Self-pay

## 2013-02-19 ENCOUNTER — Encounter: Payer: Self-pay | Admitting: Family Medicine

## 2013-02-19 NOTE — Telephone Encounter (Signed)
Pt request written rx oxycodone apap for pleurisy pain. Call when ready for pick up.

## 2013-02-20 MED ORDER — OXYCODONE-ACETAMINOPHEN 10-325 MG PO TABS: 1.0000 | ORAL_TABLET | Freq: Four times a day (QID) | ORAL | Status: DC | PRN

## 2013-02-20 NOTE — Telephone Encounter (Signed)
Left message on machine that rx is ready for pick-up, and it will be at our front desk.  

## 2013-02-20 NOTE — Telephone Encounter (Signed)
Pt is out of oxycodone apap and request to pick up rx today.Please advise.

## 2013-02-20 NOTE — Telephone Encounter (Signed)
Printed, please give to patient.  

## 2013-03-04 ENCOUNTER — Other Ambulatory Visit: Payer: Self-pay | Admitting: Family Medicine

## 2013-03-04 NOTE — Telephone Encounter (Signed)
Electronic refill request.  Please advise. 

## 2013-03-05 NOTE — Telephone Encounter (Signed)
Sent!

## 2013-03-08 ENCOUNTER — Other Ambulatory Visit: Payer: Self-pay | Admitting: Family Medicine

## 2013-03-08 MED ORDER — OXYCODONE-ACETAMINOPHEN 10-325 MG PO TABS: 1.0000 | ORAL_TABLET | Freq: Four times a day (QID) | ORAL | Status: DC | PRN

## 2013-03-08 NOTE — Telephone Encounter (Signed)
We need more time than this to fill a controlled medicine.  She can pick it up on Monday.  She needs to be rechecked.

## 2013-03-08 NOTE — Telephone Encounter (Signed)
Electronic refill request.  Please advise. 

## 2013-03-08 NOTE — Telephone Encounter (Signed)
Pt request rx oxycodone apap; pt request quantity # [id number]. Pt will be out of med today. Pt request to pick up rx today. Call when ready for pick up.

## 2013-03-11 NOTE — Telephone Encounter (Signed)
Patient advised that Rx is ready for pick up and to schedule an OV.  She will schedule when she picks up Rx.

## 2013-03-15 DIAGNOSIS — D2310 Other benign neoplasm of skin of unspecified eyelid, including canthus: Secondary | ICD-10-CM | POA: Diagnosis not present

## 2013-04-02 ENCOUNTER — Ambulatory Visit (INDEPENDENT_AMBULATORY_CARE_PROVIDER_SITE_OTHER): Payer: Medicare Other | Admitting: Family Medicine

## 2013-04-02 ENCOUNTER — Ambulatory Visit (INDEPENDENT_AMBULATORY_CARE_PROVIDER_SITE_OTHER)
Admission: RE | Admit: 2013-04-02 | Discharge: 2013-04-02 | Disposition: A | Discharge status: Home / Self Care | Payer: Medicare Other | Source: Ambulatory Visit | Attending: Family Medicine | Admitting: Family Medicine

## 2013-04-02 ENCOUNTER — Encounter: Payer: Self-pay | Admitting: Family Medicine

## 2013-04-02 VITALS — BP 110/78 | HR 79 | Temp 98.1°F | Wt 119.8 lb

## 2013-04-02 DIAGNOSIS — M25519 Pain in unspecified shoulder: Secondary | ICD-10-CM

## 2013-04-02 DIAGNOSIS — M25819 Other specified joint disorders, unspecified shoulder: Secondary | ICD-10-CM | POA: Diagnosis not present

## 2013-04-02 MED ORDER — HYDROCODONE-ACETAMINOPHEN 5-325 MG PO TABS: ORAL_TABLET | ORAL | Status: DC

## 2013-04-02 NOTE — Patient Instructions (Addendum)
Stop the oxycodone and start back on hydrocodone.  Use the exercises we discussed.   See Shirlee Limerick about your referral before you leave today. Take care.

## 2013-04-02 NOTE — Progress Notes (Signed)
Pt's mother has moved back to a separate apartment and the patient's sister is caring for their mother intermittently.  Pt's daughter has been in the hospital at [facility]. This all has been upsetting.   She continues have pain in her shoulders, pain with movements above her head and getting her coat on. Pain radiates down both arms equally.  No trauma. She still has some pain with a deep breath but she has no cough.  Was prev dx'd with pleurisy.  "I don't feel sick now." she is prednisone intolerant and has mult other intolerances.   Meds, vitals, and allergies reviewed.   ROS: See HPI.  Otherwise, noncontributory.  nad ncat Mmm rrr ctab B shoulders with pain on ext>int rotation and ROM above the head.  B + impingement.  Distally nv intact in the arms.

## 2013-04-03 NOTE — Assessment & Plan Note (Signed)
Concern for B cuff pathology.  Refer to ortho. She was intolerant of oral prednisone but may tolerate injected steroids.  I would like ortho input.  See notes on images, I also reviewed the plain films. >25 min spent with face to face with patient, >50% counseling and/or coordinating care.

## 2013-04-04 ENCOUNTER — Other Ambulatory Visit: Payer: Self-pay

## 2013-04-04 MED ORDER — HYDROCODONE-ACETAMINOPHEN 5-325 MG PO TABS: ORAL_TABLET | ORAL | Status: DC

## 2013-04-04 NOTE — Telephone Encounter (Signed)
Pt said has ortho appt on 04/23/13; vicodin 5/325 mg taking 2 tabs does not take care of pt's shoulder pain. Pt request rx oxycodone-apap. Call when ready for pick up ASAP.

## 2013-04-04 NOTE — Telephone Encounter (Signed)
Patient's daughter advised.  

## 2013-04-04 NOTE — Telephone Encounter (Signed)
I wouldn't go back on the oxycodone.  It would be okay to increase up to 2 tabs of the hydrocodone tid.  That should help with the pain.  I adjusted the med list.

## 2013-04-08 DIAGNOSIS — H0019 Chalazion unspecified eye, unspecified eyelid: Secondary | ICD-10-CM | POA: Diagnosis not present

## 2013-04-10 DIAGNOSIS — F319 Bipolar disorder, unspecified: Secondary | ICD-10-CM | POA: Diagnosis not present

## 2013-04-11 ENCOUNTER — Ambulatory Visit: Payer: Self-pay | Admitting: Internal Medicine

## 2013-04-11 ENCOUNTER — Telehealth: Payer: Self-pay | Admitting: Family Medicine

## 2013-04-11 DIAGNOSIS — M25519 Pain in unspecified shoulder: Secondary | ICD-10-CM | POA: Diagnosis not present

## 2013-04-11 DIAGNOSIS — F341 Dysthymic disorder: Secondary | ICD-10-CM | POA: Diagnosis not present

## 2013-04-11 DIAGNOSIS — G8929 Other chronic pain: Secondary | ICD-10-CM | POA: Diagnosis not present

## 2013-04-11 DIAGNOSIS — B370 Candidal stomatitis: Secondary | ICD-10-CM | POA: Diagnosis not present

## 2013-04-11 DIAGNOSIS — K5900 Constipation, unspecified: Secondary | ICD-10-CM | POA: Diagnosis not present

## 2013-04-11 DIAGNOSIS — Z79899 Other long term (current) drug therapy: Secondary | ICD-10-CM | POA: Diagnosis not present

## 2013-04-11 DIAGNOSIS — M1990 Unspecified osteoarthritis, unspecified site: Secondary | ICD-10-CM | POA: Diagnosis not present

## 2013-04-11 NOTE — Telephone Encounter (Signed)
Noted. Agree with [facility] eval if uncontrolled pain. Will route to PCP to be aware.

## 2013-04-11 NOTE — Telephone Encounter (Signed)
Patient Information:  Caller Name: Kimiyo  Phone: [phone]  Patient: Leslie Wong  Gender: Female  DOB: 01-Mar-1956  Age: 57 Years  PCP: Crawford Givens Clelia Croft) [facility])  Pregnant: No  Office Follow Up:  Does the office need to follow up with this patient?: No  Instructions For The Office: N/A   Symptoms  Reason For Call & Symptoms: Patient state she was diagnosed with Rotator Cuff pain.  Taken off Percocet and put on Vicodin; she has become "severely constipated" and the Vicodin does not control pain.  Reports she had no problems on Percocet.  States she sees Orthopedist 04/23/13 and she does not feel she can cope with pain until then.   Also relates she has raw feeling and white spots  in mouth.  States she has left one message and was informed Dr. Para March would not reorder the Percocet, but she wants him to know what the Vicodin is doing to her. Pain rated at 9/10, interferes with activity.  Emergent symptoms ruled out.  Go to Office Now per Shoulder Pain guideline.  No appointments available at office this date.  Caller advised to go to  [facility] for evaluation and treatment of her pain at this time since it is late in the day.  Reviewed Health History In EMR: Yes  Reviewed Medications In EMR: Yes  Reviewed Allergies In EMR: Yes  Reviewed Surgeries / Procedures: Yes  Date of Onset of Symptoms: Unknown OB / GYN:  LMP: Unknown  Guideline(s) Used:  Shoulder Pain  Disposition Per Guideline:   Go to Office Now  Reason For Disposition Reached:   Severe pain (e.g., excruciating, unable to do any normal activities)  Advice Given:  Call Back If  You become worse.  RN Overrode Recommendation:  Go To [facility].  No appoitntments available at office today.

## 2013-04-14 NOTE — Telephone Encounter (Signed)
Noted. I will await UC notes.

## 2013-04-17 ENCOUNTER — Other Ambulatory Visit: Payer: Self-pay | Admitting: Family Medicine

## 2013-04-23 ENCOUNTER — Telehealth: Payer: Self-pay

## 2013-04-23 DIAGNOSIS — M75100 Unspecified rotator cuff tear or rupture of unspecified shoulder, not specified as traumatic: Secondary | ICD-10-CM | POA: Diagnosis not present

## 2013-04-23 NOTE — Telephone Encounter (Signed)
Pt saw Dr Clemmie Krill and received injection in both shoulders; Dr Clemmie Krill does not write pain med and was advised to get pain med from PCP until can get referred to [facility] pain clinic.Pt said Dr Clemmie Krill put in referral to pain clinic and also sending pt for PT. Vicodin causes nausea, thrush and constipation. Pt said wants to pick up rx percocet 10/325 mg/ call when ready for pick up.

## 2013-04-24 NOTE — Telephone Encounter (Signed)
Spoke with patient and she said that she currently has thrush and horrible constipation from the vicodin. I advised that vicodin wouldn't cause thrush and that constipation is an unfortunate side effect of almost all pain meds. She said she had magic mouthwash from Dr. Hetty Ely and it has almost cleared up the thrush but the constipation was horrible and percocet never caused that for her. I advised that she would need an appt in order for meds to be changed and she would have to bring remaining vicodin with her to the appt. She verbalized understanding and appt scheduled.

## 2013-04-24 NOTE — Telephone Encounter (Signed)
Agreed, noted.  

## 2013-04-24 NOTE — Telephone Encounter (Signed)
No.  She needs OV.  I cannot change the opiates w/o rechecking her.  It is noted that she tolerated the hydrocodone for months at a time prev.  If she has thrush, this needs to be evaluated, because it is not likely to be from hydrocodone.

## 2013-04-24 NOTE — Telephone Encounter (Signed)
Attempted to call patient. No answer and mailbox full-could not leave message. Will try again later.

## 2013-04-26 ENCOUNTER — Ambulatory Visit (INDEPENDENT_AMBULATORY_CARE_PROVIDER_SITE_OTHER): Payer: Medicare Other | Admitting: Family Medicine

## 2013-04-26 ENCOUNTER — Encounter: Payer: Self-pay | Admitting: Family Medicine

## 2013-04-26 VITALS — BP 124/84 | HR 93 | Temp 98.8°F | Wt 118.0 lb

## 2013-04-26 DIAGNOSIS — M549 Dorsalgia, unspecified: Secondary | ICD-10-CM

## 2013-04-26 DIAGNOSIS — G8929 Other chronic pain: Secondary | ICD-10-CM

## 2013-04-26 DIAGNOSIS — M25519 Pain in unspecified shoulder: Secondary | ICD-10-CM | POA: Diagnosis not present

## 2013-04-26 MED ORDER — OXYCODONE-ACETAMINOPHEN 10-325 MG PO TABS: 1.0000 | ORAL_TABLET | Freq: Two times a day (BID) | ORAL | Status: DC | PRN

## 2013-04-26 NOTE — Progress Notes (Signed)
She had constipation prev, but had an increase more recently on the vicodin.   She has tried OTC interventions.  She is still in pain with both shoulders and her lower bac,  Pt said Dr Clemmie Krill put in referral to pain clinic and also sending pt for PT.  This is pending.   The thrush is resolved (and her mouth feels better) with [facility] magic mouthwash.    Per patient she was able to see Dr Venetia Maxon about her back in 2013.  Her back continues to hurt as it prev does.  We are requesting records.  She continues to have chronic pain along the L side of her neck posteriorly and the R lower lumbar spine.  She has no new weakness but continues to have pain with ROM at the shoulders >90deg.   We discussed he case in detail.  She brought in her leftover rx of hydrocodone and this was placed in the sharps container in her presence.    Meds, vitals, and allergies reviewed.   ROS: See HPI.  Otherwise, noncontributory.  nad but uncomfortable Speech wnl Neck with tenderness on the L side of the posterior neck.   rrr ctab Pain with ROM at the shoulders >90deg abduction R lower back is tender, just to the side of the midline Gait is at baseline.   Prev MRI lumbar spine report reviewed.

## 2013-04-26 NOTE — Patient Instructions (Addendum)
We'll get the note from Dr. Fredrich Birks office.  Switch back to the oxycodone twice a day.  I will await the PT and pain clinic notes.  Take care.

## 2013-04-28 NOTE — Assessment & Plan Note (Signed)
Recently injected with pain clinic eval in process.  >25 min spent with face to face with patient, >50% counseling and/or coordinating care

## 2013-04-28 NOTE — Assessment & Plan Note (Signed)
Would change to oxycodone, 1 po bid and she can take less if needed. She agrees.  PT and pain clinic eval are pending.  She has documented L spine pathology and continues to have chronic pain.  Will treat symptomatically.  Not sedated.  D/w pt about bowel regimen for her meds.

## 2013-05-01 ENCOUNTER — Telehealth: Payer: Self-pay | Admitting: Family Medicine

## 2013-05-01 DIAGNOSIS — G8929 Other chronic pain: Secondary | ICD-10-CM

## 2013-05-01 DIAGNOSIS — R32 Unspecified urinary incontinence: Secondary | ICD-10-CM | POA: Diagnosis not present

## 2013-05-01 NOTE — Telephone Encounter (Signed)
Noted from ortho reviewed.  Ortho rec'd pain clinic referral, but didn't make the referral.  I have ordered this.

## 2013-05-06 NOTE — Telephone Encounter (Signed)
Referral form filled out for [facility], referral faxed and they will call the patient directly to set up. Patient notified of protocol of [facility]. MK

## 2013-06-12 DIAGNOSIS — F319 Bipolar disorder, unspecified: Secondary | ICD-10-CM | POA: Diagnosis not present

## 2013-06-18 ENCOUNTER — Telehealth: Payer: Self-pay | Admitting: Family Medicine

## 2013-06-18 NOTE — Telephone Encounter (Signed)
Lacinda Axon dropped off a handicap form to be completed by Dr. Para March. She would like for you to mail it to her home address when finished. The best contact number to reach Najmo is [phone].  Thanks, Revonda Standard

## 2013-06-19 NOTE — Telephone Encounter (Signed)
Done

## 2013-06-20 ENCOUNTER — Encounter: Payer: Self-pay | Admitting: *Deleted

## 2013-06-20 NOTE — Telephone Encounter (Signed)
Patient advised.  Patient asked that we put this in the mail to her.  Done.

## 2013-06-24 ENCOUNTER — Telehealth: Payer: Self-pay | Admitting: Family Medicine

## 2013-06-24 NOTE — Telephone Encounter (Signed)
Referral placed for Pain Clinic appt in May at [facility]. They have called the patient to schedule a few times and the patient has not returned their calls.

## 2013-06-25 ENCOUNTER — Encounter: Payer: Self-pay | Admitting: *Deleted

## 2013-06-25 NOTE — Telephone Encounter (Signed)
Noted, please send her a letter about this.

## 2013-06-25 NOTE — Telephone Encounter (Signed)
Done

## 2013-07-16 DIAGNOSIS — J00 Acute nasopharyngitis [common cold]: Secondary | ICD-10-CM | POA: Diagnosis not present

## 2013-07-16 DIAGNOSIS — H9209 Otalgia, unspecified ear: Secondary | ICD-10-CM | POA: Diagnosis not present

## 2013-07-25 DIAGNOSIS — L821 Other seborrheic keratosis: Secondary | ICD-10-CM | POA: Diagnosis not present

## 2013-07-25 DIAGNOSIS — L570 Actinic keratosis: Secondary | ICD-10-CM | POA: Diagnosis not present

## 2013-08-05 ENCOUNTER — Other Ambulatory Visit: Payer: Self-pay | Admitting: Family Medicine

## 2013-08-05 NOTE — Telephone Encounter (Signed)
Received refill request electronically. Last office visit 04/26/13. Is it okay to refill medication? 

## 2013-08-05 NOTE — Telephone Encounter (Signed)
Sent!

## 2013-08-22 ENCOUNTER — Other Ambulatory Visit: Payer: Self-pay | Admitting: *Deleted

## 2013-08-22 MED ORDER — OXYCODONE-ACETAMINOPHEN 10-325 MG PO TABS: 1.0000 | ORAL_TABLET | Freq: Two times a day (BID) | ORAL | Status: DC | PRN

## 2013-08-22 NOTE — Telephone Encounter (Signed)
Printed, what is her status with the pain clinic? Let me know.  Thanks.

## 2013-08-22 NOTE — Telephone Encounter (Signed)
Patient requests Rx written and call for pick up.

## 2013-08-23 ENCOUNTER — Telehealth: Payer: Self-pay | Admitting: Family Medicine

## 2013-08-23 NOTE — Telephone Encounter (Signed)
When I brought the Rx back to pt's daughter to sign for, daughter was on the phone w/pt. I asked her about the status of the pain clinic and the daughter handed me her cell phone to speak w/pt.  Pt advised she was not going back to the pain clinic. She says she went 25 yrs ago and it was torture so she refuses to go back.  Thanks!

## 2013-08-23 NOTE — Telephone Encounter (Signed)
Left detailed message on voicemail.  

## 2013-08-23 NOTE — Telephone Encounter (Signed)
We cannot refill anything else for patient until she comes into talk to me at OV.  The plan was for f/u at pain clinic prev and this needs to be addressed.

## 2013-08-28 ENCOUNTER — Other Ambulatory Visit: Payer: Self-pay | Admitting: Family Medicine

## 2013-08-29 NOTE — Telephone Encounter (Signed)
Received refill request electronically. Last office visit 04/26/13. Is it okay to refill medication?

## 2013-08-30 NOTE — Telephone Encounter (Signed)
Sent!

## 2013-09-02 DIAGNOSIS — M542 Cervicalgia: Secondary | ICD-10-CM | POA: Diagnosis not present

## 2013-09-02 DIAGNOSIS — M999 Biomechanical lesion, unspecified: Secondary | ICD-10-CM | POA: Diagnosis not present

## 2013-09-02 DIAGNOSIS — M546 Pain in thoracic spine: Secondary | ICD-10-CM | POA: Diagnosis not present

## 2013-09-02 DIAGNOSIS — M9981 Other biomechanical lesions of cervical region: Secondary | ICD-10-CM | POA: Diagnosis not present

## 2013-09-02 DIAGNOSIS — M545 Low back pain: Secondary | ICD-10-CM | POA: Diagnosis not present

## 2013-09-02 DIAGNOSIS — M5126 Other intervertebral disc displacement, lumbar region: Secondary | ICD-10-CM | POA: Diagnosis not present

## 2013-09-02 DIAGNOSIS — M5020 Other cervical disc displacement, unspecified cervical region: Secondary | ICD-10-CM | POA: Diagnosis not present

## 2013-09-04 DIAGNOSIS — M9981 Other biomechanical lesions of cervical region: Secondary | ICD-10-CM | POA: Diagnosis not present

## 2013-09-04 DIAGNOSIS — R32 Unspecified urinary incontinence: Secondary | ICD-10-CM | POA: Diagnosis not present

## 2013-09-04 DIAGNOSIS — M545 Low back pain: Secondary | ICD-10-CM | POA: Diagnosis not present

## 2013-09-04 DIAGNOSIS — M542 Cervicalgia: Secondary | ICD-10-CM | POA: Diagnosis not present

## 2013-09-04 DIAGNOSIS — M546 Pain in thoracic spine: Secondary | ICD-10-CM | POA: Diagnosis not present

## 2013-09-04 DIAGNOSIS — M5126 Other intervertebral disc displacement, lumbar region: Secondary | ICD-10-CM | POA: Diagnosis not present

## 2013-09-04 DIAGNOSIS — M5020 Other cervical disc displacement, unspecified cervical region: Secondary | ICD-10-CM | POA: Diagnosis not present

## 2013-09-04 DIAGNOSIS — M999 Biomechanical lesion, unspecified: Secondary | ICD-10-CM | POA: Diagnosis not present

## 2013-09-16 DIAGNOSIS — M5126 Other intervertebral disc displacement, lumbar region: Secondary | ICD-10-CM | POA: Diagnosis not present

## 2013-09-16 DIAGNOSIS — M9981 Other biomechanical lesions of cervical region: Secondary | ICD-10-CM | POA: Diagnosis not present

## 2013-09-16 DIAGNOSIS — M542 Cervicalgia: Secondary | ICD-10-CM | POA: Diagnosis not present

## 2013-09-16 DIAGNOSIS — M546 Pain in thoracic spine: Secondary | ICD-10-CM | POA: Diagnosis not present

## 2013-09-16 DIAGNOSIS — M5020 Other cervical disc displacement, unspecified cervical region: Secondary | ICD-10-CM | POA: Diagnosis not present

## 2013-09-16 DIAGNOSIS — M999 Biomechanical lesion, unspecified: Secondary | ICD-10-CM | POA: Diagnosis not present

## 2013-09-16 DIAGNOSIS — M545 Low back pain: Secondary | ICD-10-CM | POA: Diagnosis not present

## 2013-10-09 DIAGNOSIS — F411 Generalized anxiety disorder: Secondary | ICD-10-CM | POA: Diagnosis not present

## 2013-10-09 DIAGNOSIS — F319 Bipolar disorder, unspecified: Secondary | ICD-10-CM | POA: Diagnosis not present

## 2013-10-28 ENCOUNTER — Other Ambulatory Visit: Payer: Self-pay | Admitting: Family Medicine

## 2013-11-04 DIAGNOSIS — Z23 Encounter for immunization: Secondary | ICD-10-CM | POA: Diagnosis not present

## 2013-11-28 ENCOUNTER — Other Ambulatory Visit: Payer: Self-pay | Admitting: Family Medicine

## 2013-11-28 NOTE — Telephone Encounter (Signed)
Sent!

## 2013-11-28 NOTE — Telephone Encounter (Signed)
Electronic refill request.  Please advise. 

## 2014-01-06 ENCOUNTER — Other Ambulatory Visit: Payer: Self-pay | Admitting: Family Medicine

## 2014-01-06 NOTE — Telephone Encounter (Signed)
done

## 2014-01-06 NOTE — Telephone Encounter (Signed)
Electronic refill request. This Rx was refilled in 12/14.  Please advise.

## 2014-01-06 NOTE — Telephone Encounter (Signed)
Please refill times one in PCP absence 

## 2014-02-02 ENCOUNTER — Other Ambulatory Visit: Payer: Self-pay | Admitting: Family Medicine

## 2014-02-03 NOTE — Telephone Encounter (Signed)
Sent!

## 2014-02-03 NOTE — Telephone Encounter (Signed)
Rout to PCP 

## 2014-02-17 ENCOUNTER — Other Ambulatory Visit: Payer: Self-pay | Admitting: Family Medicine

## 2014-02-17 DIAGNOSIS — M999 Biomechanical lesion, unspecified: Secondary | ICD-10-CM | POA: Diagnosis not present

## 2014-02-17 DIAGNOSIS — M545 Low back pain, unspecified: Secondary | ICD-10-CM | POA: Diagnosis not present

## 2014-02-17 DIAGNOSIS — M5126 Other intervertebral disc displacement, lumbar region: Secondary | ICD-10-CM | POA: Diagnosis not present

## 2014-02-17 DIAGNOSIS — M9981 Other biomechanical lesions of cervical region: Secondary | ICD-10-CM | POA: Diagnosis not present

## 2014-02-17 DIAGNOSIS — M5020 Other cervical disc displacement, unspecified cervical region: Secondary | ICD-10-CM | POA: Diagnosis not present

## 2014-02-17 DIAGNOSIS — M546 Pain in thoracic spine: Secondary | ICD-10-CM | POA: Diagnosis not present

## 2014-02-17 DIAGNOSIS — M542 Cervicalgia: Secondary | ICD-10-CM | POA: Diagnosis not present

## 2014-02-18 NOTE — Telephone Encounter (Signed)
Received refill request electronically. Last office visit 04/26/13, last TSH 11/30/11. Is it okay to refill medication?

## 2014-02-18 NOTE — Telephone Encounter (Signed)
Needs TSH and CPE done.  Sent in meantime.

## 2014-02-19 ENCOUNTER — Ambulatory Visit: Payer: Medicare Other | Admitting: Family Medicine

## 2014-02-19 NOTE — Telephone Encounter (Signed)
Scheduled and let pt know that her synthroid was called into pharmacy

## 2014-02-21 ENCOUNTER — Ambulatory Visit: Payer: Medicare Other | Admitting: Family Medicine

## 2014-02-21 ENCOUNTER — Other Ambulatory Visit: Payer: Self-pay | Admitting: Family Medicine

## 2014-02-21 DIAGNOSIS — E039 Hypothyroidism, unspecified: Secondary | ICD-10-CM

## 2014-02-21 DIAGNOSIS — E559 Vitamin D deficiency, unspecified: Secondary | ICD-10-CM

## 2014-02-21 DIAGNOSIS — E7800 Pure hypercholesterolemia, unspecified: Secondary | ICD-10-CM

## 2014-02-24 ENCOUNTER — Other Ambulatory Visit (INDEPENDENT_AMBULATORY_CARE_PROVIDER_SITE_OTHER): Payer: Medicare Other

## 2014-02-24 ENCOUNTER — Encounter: Payer: Self-pay | Admitting: Family Medicine

## 2014-02-24 ENCOUNTER — Ambulatory Visit (INDEPENDENT_AMBULATORY_CARE_PROVIDER_SITE_OTHER): Payer: Medicare Other | Admitting: Family Medicine

## 2014-02-24 DIAGNOSIS — G8929 Other chronic pain: Secondary | ICD-10-CM | POA: Diagnosis not present

## 2014-02-24 DIAGNOSIS — M5020 Other cervical disc displacement, unspecified cervical region: Secondary | ICD-10-CM | POA: Diagnosis not present

## 2014-02-24 DIAGNOSIS — M545 Low back pain, unspecified: Secondary | ICD-10-CM | POA: Diagnosis not present

## 2014-02-24 DIAGNOSIS — M549 Dorsalgia, unspecified: Secondary | ICD-10-CM

## 2014-02-24 DIAGNOSIS — E7800 Pure hypercholesterolemia, unspecified: Secondary | ICD-10-CM | POA: Diagnosis not present

## 2014-02-24 DIAGNOSIS — E559 Vitamin D deficiency, unspecified: Secondary | ICD-10-CM | POA: Diagnosis not present

## 2014-02-24 DIAGNOSIS — M5126 Other intervertebral disc displacement, lumbar region: Secondary | ICD-10-CM | POA: Diagnosis not present

## 2014-02-24 DIAGNOSIS — E039 Hypothyroidism, unspecified: Secondary | ICD-10-CM | POA: Diagnosis not present

## 2014-02-24 DIAGNOSIS — M999 Biomechanical lesion, unspecified: Secondary | ICD-10-CM | POA: Diagnosis not present

## 2014-02-24 DIAGNOSIS — M546 Pain in thoracic spine: Secondary | ICD-10-CM | POA: Diagnosis not present

## 2014-02-24 DIAGNOSIS — M542 Cervicalgia: Secondary | ICD-10-CM | POA: Diagnosis not present

## 2014-02-24 DIAGNOSIS — M9981 Other biomechanical lesions of cervical region: Secondary | ICD-10-CM | POA: Diagnosis not present

## 2014-02-24 LAB — COMPREHENSIVE METABOLIC PANEL
ALBUMIN: 4.2 g/dL (ref 3.5–5.2)
ALT: 21 U/L (ref 0–35)
AST: 22 U/L (ref 0–37)
Alkaline Phosphatase: 89 U/L (ref 39–117)
BUN: 11 mg/dL (ref 6–23)
CALCIUM: 10.8 mg/dL — AB (ref 8.4–10.5)
CHLORIDE: 107 meq/L (ref 96–112)
CO2: 29 meq/L (ref 19–32)
Creatinine, Ser: 1 mg/dL (ref 0.4–1.2)
GFR: 60.43 mL/min (ref >60.00)
GLUCOSE: 95 mg/dL (ref 70–99)
Potassium: 4.3 mEq/L (ref 3.5–5.1)
SODIUM: 140 meq/L (ref 135–145)
TOTAL PROTEIN: 7.4 g/dL (ref 6.0–8.3)
Total Bilirubin: 0.7 mg/dL (ref 0.3–1.2)

## 2014-02-24 LAB — LIPID PANEL
CHOLESTEROL: 259 mg/dL — AB (ref 0–200)
HDL: 34.8 mg/dL — ABNORMAL LOW (ref >39.00)
LDL Cholesterol: 152 mg/dL — ABNORMAL HIGH (ref 0–99)
Total CHOL/HDL Ratio: 7
Triglycerides: 362 mg/dL — ABNORMAL HIGH (ref 0.0–149.0)
VLDL: 72.4 mg/dL — ABNORMAL HIGH (ref 0.0–40.0)

## 2014-02-24 LAB — TSH: TSH: 1.36 u[IU]/mL (ref 0.35–5.50)

## 2014-02-24 NOTE — Assessment & Plan Note (Signed)
See notes on labs. 

## 2014-02-24 NOTE — Progress Notes (Signed)
Pre visit review using our clinic review tool, if applicable. No additional management support is needed unless otherwise documented below in the visit note.  In the interval since the last OV, mother has been ill, her daughter has been at the neurosurgery clinic.  "My biggest fear is my daughter dying."  Her son is back in prison, will likely be in until 2016.    Chronic back pain.  Had been referred to pain clinic.  She didn't follow through with the referral.  I encouraged her to consider the pain clinic eval.  She is going to the chiropractor.  I didn't fill her pain meds today, haven't in months.  Discussed.    Hypothyroid.  Weight is up.  Lab results are pending.  No neck mass. No dysphagia.  Fatigued.    She has B lip cracking, at the junction of her lips laterally.    GERD generally controlled with PPI.    Meds, vitals, and allergies reviewed.   ROS: See HPI.  Otherwise, noncontributory.  nad ncat Neck supple, no LA Mmm rrr ctab abd soft Ext w/o edema

## 2014-02-24 NOTE — Patient Instructions (Signed)
We'll contact you with your lab report.  We may have to adjust your thyroid medicine.  I would get OTC B6 for the cracking lips.

## 2014-02-24 NOTE — Assessment & Plan Note (Signed)
She declined pain clinic eval.  The point of my last rx was to get her time to f/u with pain clinic.  This was explained in detail.  She doesn't have to go to pain clinic, but I haven't rx'd pain meds for her in months and she was supposed to f/u with pain clinic in the meantime.  She can see second opinion. >25 minutes spent in face to face time with patient, >50% spent in counselling or coordination of care.

## 2014-02-25 DIAGNOSIS — H0019 Chalazion unspecified eye, unspecified eyelid: Secondary | ICD-10-CM | POA: Diagnosis not present

## 2014-02-25 LAB — VITAMIN D 25 HYDROXY (VIT D DEFICIENCY, FRACTURES): Vit D, 25-Hydroxy: 25 ng/mL — ABNORMAL LOW (ref 30–89)

## 2014-03-10 ENCOUNTER — Telehealth: Payer: Self-pay

## 2014-03-10 DIAGNOSIS — M549 Dorsalgia, unspecified: Secondary | ICD-10-CM

## 2014-03-10 NOTE — Telephone Encounter (Signed)
Referral done.  Thanks.

## 2014-03-10 NOTE — Telephone Encounter (Signed)
Pt left v/m; pt was seen 02/24/14 and pt is now requesting referral to pain clinic at [facility] for back pain.Please advise.

## 2014-03-11 ENCOUNTER — Other Ambulatory Visit: Payer: Medicare Other

## 2014-03-12 NOTE — Telephone Encounter (Signed)
Pt left v/m wanting to know status of pain clinic appt. Pt request cb.

## 2014-03-13 NOTE — Telephone Encounter (Signed)
Called the patient to let her know that we have faxed the referral request to [facility] and that they will call her to schedule the appt.

## 2014-03-18 ENCOUNTER — Ambulatory Visit (INDEPENDENT_AMBULATORY_CARE_PROVIDER_SITE_OTHER): Payer: Medicare Other | Admitting: Family Medicine

## 2014-03-18 ENCOUNTER — Encounter: Payer: Self-pay | Admitting: Family Medicine

## 2014-03-18 VITALS — BP 122/78 | HR 86 | Temp 98.3°F | Ht 60.0 in | Wt 125.8 lb

## 2014-03-18 DIAGNOSIS — G8929 Other chronic pain: Secondary | ICD-10-CM

## 2014-03-18 DIAGNOSIS — Z1211 Encounter for screening for malignant neoplasm of colon: Secondary | ICD-10-CM

## 2014-03-18 DIAGNOSIS — M549 Dorsalgia, unspecified: Secondary | ICD-10-CM

## 2014-03-18 DIAGNOSIS — Z0000 Encounter for general adult medical examination without abnormal findings: Secondary | ICD-10-CM | POA: Diagnosis not present

## 2014-03-18 DIAGNOSIS — E039 Hypothyroidism, unspecified: Secondary | ICD-10-CM

## 2014-03-18 DIAGNOSIS — E2839 Other primary ovarian failure: Secondary | ICD-10-CM

## 2014-03-18 NOTE — Assessment & Plan Note (Signed)
Pain clinic eval pending.

## 2014-03-18 NOTE — Assessment & Plan Note (Signed)
Likely related to lithium use.  Will check DXA.

## 2014-03-18 NOTE — Assessment & Plan Note (Signed)
Controlled, no TMG on exam.

## 2014-03-18 NOTE — Patient Instructions (Addendum)
Go to the lab on the way out.  We'll contact you with your lab report. Call about a mammogram.   Leslie Wong will call about your referral for the bone density test.  Call the pain clinic about the referral.   Use neosporin or triple antibiotic ointment in your right nostril a few times a day.  That spot should heal gradually.  If not, then let us know.  Take care.  The clinic at [facility] should be able to get access to your labs here through their computer system.

## 2014-03-18 NOTE — Progress Notes (Signed)
Pre visit review using our clinic review tool, if applicable. No additional management support is needed unless otherwise documented below in the visit note.  CPE- See plan.  Routine anticipatory guidance given to patient.  See health maintenance. Tetanus 2008 Flu shot encouraged.  Done yearly.  PNA and shingles shot deferred for now, until her 41s.   Pap smear done 2012, wnl.  Hysterectomy wasn't for cancer.   Mammogram d/w pt.  DXA d/w pt.  On lithium, postmenopausal.  Still with hot flashes, but less than prev.   Living will d/w pt.  Would have her daughter designated if patient were incapacitated.   D/w patient [id number] for colon cancer screening, including IFOB vs. colonoscopy.  Risks and benefits of both were discussed and patient voiced understanding.  Pt elects for: IFOB.    Hypercalcemia noted, likely from lithium. D/w pt.  Prev notes reviewed.    She has f/u with [facility] psych pending.    She has referral to pain clinic prev done.  She can call about that.    PMH and SH reviewed  Meds, vitals, and allergies reviewed.   ROS: See HPI.  Otherwise negative.    GEN: nad, alert and oriented HEENT: mucous membranes moist, nasal exam wnl except for small sore area on medial portion of distal R medial nostril NECK: supple w/o LA CV: rrr. PULM: ctab, no inc wob ABD: soft, +bs EXT: no edema SKIN: no acute rash

## 2014-03-18 NOTE — Assessment & Plan Note (Signed)
Routine anticipatory guidance given to patient.  See health maintenance. Tetanus 2008 Flu shot encouraged.  Done yearly.  PNA and shingles shot deferred for now, until her 35s.   Pap smear done 2012, wnl.  Hysterectomy wasn't for cancer.   Mammogram d/w pt.  DXA d/w pt.  On lithium, postmenopausal.  Still with hot flashes, but less than prev.   Living will d/w pt.  Would have her daughter designated if patient were incapacitated.   D/w patient [id number] for colon cancer screening, including IFOB vs. colonoscopy.  Risks and benefits of both were discussed and patient voiced understanding.  Pt elects for: IFOB.   Topical tx for nasal sore.

## 2014-03-19 ENCOUNTER — Telehealth: Payer: Self-pay | Admitting: Family Medicine

## 2014-03-19 NOTE — Telephone Encounter (Signed)
Relevant patient education assigned to patient using Emmi. ° °

## 2014-03-23 ENCOUNTER — Other Ambulatory Visit: Payer: Self-pay | Admitting: Family Medicine

## 2014-03-24 NOTE — Telephone Encounter (Signed)
Electronic refill request.  Last seen:  03/18/14   Last filled:  30 tablets with 0 RF on 02/02/2014

## 2014-03-24 NOTE — Telephone Encounter (Signed)
Sent. Thanks.   

## 2014-04-01 DIAGNOSIS — H2510 Age-related nuclear cataract, unspecified eye: Secondary | ICD-10-CM | POA: Diagnosis not present

## 2014-04-02 DIAGNOSIS — F319 Bipolar disorder, unspecified: Secondary | ICD-10-CM | POA: Diagnosis not present

## 2014-04-09 ENCOUNTER — Telehealth: Payer: Self-pay | Admitting: Family Medicine

## 2014-04-09 DIAGNOSIS — Z1231 Encounter for screening mammogram for malignant neoplasm of breast: Secondary | ICD-10-CM

## 2014-04-09 NOTE — Telephone Encounter (Signed)
I was scheduling pt for bone density and she is due for her mammo as well. Will you put in a mammo order please. Thank you Dr. Damita Dunnings !!

## 2014-04-09 NOTE — Telephone Encounter (Signed)
Ordered

## 2014-04-10 ENCOUNTER — Other Ambulatory Visit (INDEPENDENT_AMBULATORY_CARE_PROVIDER_SITE_OTHER): Payer: Medicare Other

## 2014-04-10 DIAGNOSIS — Z1211 Encounter for screening for malignant neoplasm of colon: Secondary | ICD-10-CM

## 2014-04-10 LAB — FECAL OCCULT BLOOD, IMMUNOCHEMICAL: Fecal Occult Bld: NEGATIVE

## 2014-04-11 ENCOUNTER — Encounter: Payer: Self-pay | Admitting: *Deleted

## 2014-04-18 ENCOUNTER — Encounter: Payer: Self-pay | Admitting: Family Medicine

## 2014-04-18 ENCOUNTER — Ambulatory Visit: Payer: Self-pay | Admitting: Family Medicine

## 2014-04-18 DIAGNOSIS — Z1231 Encounter for screening mammogram for malignant neoplasm of breast: Secondary | ICD-10-CM | POA: Diagnosis not present

## 2014-04-18 DIAGNOSIS — M8580 Other specified disorders of bone density and structure, unspecified site: Secondary | ICD-10-CM | POA: Insufficient documentation

## 2014-04-18 DIAGNOSIS — M899 Disorder of bone, unspecified: Secondary | ICD-10-CM | POA: Diagnosis not present

## 2014-04-18 DIAGNOSIS — M949 Disorder of cartilage, unspecified: Secondary | ICD-10-CM | POA: Diagnosis not present

## 2014-04-23 DIAGNOSIS — F319 Bipolar disorder, unspecified: Secondary | ICD-10-CM | POA: Diagnosis not present

## 2014-04-23 DIAGNOSIS — F4310 Post-traumatic stress disorder, unspecified: Secondary | ICD-10-CM | POA: Diagnosis not present

## 2014-04-23 DIAGNOSIS — F411 Generalized anxiety disorder: Secondary | ICD-10-CM | POA: Diagnosis not present

## 2014-04-23 DIAGNOSIS — G4700 Insomnia, unspecified: Secondary | ICD-10-CM | POA: Diagnosis not present

## 2014-04-23 DIAGNOSIS — F603 Borderline personality disorder: Secondary | ICD-10-CM | POA: Diagnosis not present

## 2014-04-29 ENCOUNTER — Other Ambulatory Visit: Payer: Self-pay | Admitting: Family Medicine

## 2014-05-20 ENCOUNTER — Other Ambulatory Visit: Payer: Self-pay | Admitting: Family Medicine

## 2014-06-03 DIAGNOSIS — M999 Biomechanical lesion, unspecified: Secondary | ICD-10-CM | POA: Diagnosis not present

## 2014-06-03 DIAGNOSIS — M5126 Other intervertebral disc displacement, lumbar region: Secondary | ICD-10-CM | POA: Diagnosis not present

## 2014-06-03 DIAGNOSIS — M9981 Other biomechanical lesions of cervical region: Secondary | ICD-10-CM | POA: Diagnosis not present

## 2014-06-03 DIAGNOSIS — M545 Low back pain, unspecified: Secondary | ICD-10-CM | POA: Diagnosis not present

## 2014-06-03 DIAGNOSIS — M546 Pain in thoracic spine: Secondary | ICD-10-CM | POA: Diagnosis not present

## 2014-06-03 DIAGNOSIS — M542 Cervicalgia: Secondary | ICD-10-CM | POA: Diagnosis not present

## 2014-06-03 DIAGNOSIS — M5020 Other cervical disc displacement, unspecified cervical region: Secondary | ICD-10-CM | POA: Diagnosis not present

## 2014-06-05 ENCOUNTER — Ambulatory Visit (INDEPENDENT_AMBULATORY_CARE_PROVIDER_SITE_OTHER)
Admission: RE | Admit: 2014-06-05 | Discharge: 2014-06-05 | Disposition: A | Discharge status: Home / Self Care | Payer: Medicare Other | Source: Ambulatory Visit | Attending: Family Medicine | Admitting: Family Medicine

## 2014-06-05 ENCOUNTER — Encounter: Payer: Self-pay | Admitting: Family Medicine

## 2014-06-05 ENCOUNTER — Ambulatory Visit (INDEPENDENT_AMBULATORY_CARE_PROVIDER_SITE_OTHER): Payer: Medicare Other | Admitting: Family Medicine

## 2014-06-05 VITALS — BP 140/92 | HR 95 | Temp 98.2°F | Wt 125.8 lb

## 2014-06-05 DIAGNOSIS — M25519 Pain in unspecified shoulder: Secondary | ICD-10-CM | POA: Diagnosis not present

## 2014-06-05 MED ORDER — OXYCODONE-ACETAMINOPHEN 5-325 MG PO TABS: 0.5000 | ORAL_TABLET | Freq: Three times a day (TID) | ORAL | Status: DC | PRN

## 2014-06-05 NOTE — Progress Notes (Signed)
Pre visit review using our clinic review tool, if applicable. No additional management support is needed unless otherwise documented below in the visit note.  She had to cancel her pain clinic eval back in 03/2014 because her daughter was sick.  Then her brother had a CVA/MI.  Mother has DM2 and was in the hospital.  Has been caring for her mother in the meantime, until recently.  She has been to a chiropractor about her neck and R arm pain.  She was reported to have tendonitis and bursitis in her shoulder.  She can still grip.  She called the pain clinic yesterday, then again this AM.  She has an appointment with them for 06/19/14.    She has had both shoulders injected prev, with some temporary relief.    She has mult med intolerances.  She hasn't moved her shoulder much in the last 2 weeks.   Meds, vitals, and allergies reviewed.   ROS: See HPI.  Otherwise, noncontributory.  nad but uncomfortable.   Not moving her R arm when walking.   Neck supple rrr ctab Dec ROM R shoulder Pain with int/ext rotation.   Grimace with A/PROM Pain with PROM >90deg No arm drop Distally nv intact  xrays viewed, noted.

## 2014-06-05 NOTE — Patient Instructions (Signed)
Rosaria Ferries will call about your referral. Keep your appointment with the pain clinic.  Oxycodone for pain in the meantime.  Use sparingly.  Take care.

## 2014-06-06 NOTE — Assessment & Plan Note (Signed)
Worsened R shoulder pain.  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  Refer to ortho.  Given rx for short term oxycodone use.   I will not refill after this; she'll have to keep appointment with ortho for her shoulder and pain clinic re: her back.  Films reviewed, no fx no dislocation.

## 2014-06-09 ENCOUNTER — Telehealth: Payer: Self-pay

## 2014-06-09 NOTE — Telephone Encounter (Signed)
Pt left v/m; pt was seen 06/05/14 and percocet 5-325 mg is not helping pain. Pt has appt on 06/19/14 with pain clinic at 11 am and appt with Leslie Wong on 06/19/14 at 1:45 pm. Pt request cb.

## 2014-06-09 NOTE — Telephone Encounter (Signed)
Patient advised.

## 2014-06-09 NOTE — Telephone Encounter (Signed)
I don't have good options in the meantime.  I would have her call the other clinics to try to get her appointments moved up or get on the cancellation list.

## 2014-06-17 ENCOUNTER — Other Ambulatory Visit: Payer: Self-pay | Admitting: Family Medicine

## 2014-06-18 NOTE — Telephone Encounter (Signed)
Sent!

## 2014-06-18 NOTE — Telephone Encounter (Signed)
Last filled 08/2013 with 1 refills last OV with you was 06/05/14---please advise

## 2014-06-19 ENCOUNTER — Ambulatory Visit: Payer: Self-pay | Admitting: Pain Medicine

## 2014-06-19 ENCOUNTER — Telehealth: Payer: Self-pay | Admitting: Family Medicine

## 2014-06-19 DIAGNOSIS — Z79899 Other long term (current) drug therapy: Secondary | ICD-10-CM | POA: Diagnosis not present

## 2014-06-19 DIAGNOSIS — M19019 Primary osteoarthritis, unspecified shoulder: Secondary | ICD-10-CM | POA: Diagnosis not present

## 2014-06-19 DIAGNOSIS — K219 Gastro-esophageal reflux disease without esophagitis: Secondary | ICD-10-CM | POA: Diagnosis not present

## 2014-06-19 DIAGNOSIS — IMO0002 Reserved for concepts with insufficient information to code with codable children: Secondary | ICD-10-CM | POA: Diagnosis not present

## 2014-06-19 DIAGNOSIS — J438 Other emphysema: Secondary | ICD-10-CM | POA: Diagnosis not present

## 2014-06-19 DIAGNOSIS — N6019 Diffuse cystic mastopathy of unspecified breast: Secondary | ICD-10-CM | POA: Diagnosis not present

## 2014-06-19 DIAGNOSIS — N35919 Unspecified urethral stricture, male, unspecified site: Secondary | ICD-10-CM | POA: Diagnosis not present

## 2014-06-19 DIAGNOSIS — G43909 Migraine, unspecified, not intractable, without status migrainosus: Secondary | ICD-10-CM | POA: Diagnosis not present

## 2014-06-19 DIAGNOSIS — K589 Irritable bowel syndrome without diarrhea: Secondary | ICD-10-CM | POA: Diagnosis not present

## 2014-06-19 DIAGNOSIS — K141 Geographic tongue: Secondary | ICD-10-CM | POA: Diagnosis not present

## 2014-06-19 DIAGNOSIS — R259 Unspecified abnormal involuntary movements: Secondary | ICD-10-CM | POA: Diagnosis not present

## 2014-06-19 DIAGNOSIS — M5030 Other cervical disc degeneration, unspecified cervical region: Secondary | ICD-10-CM | POA: Diagnosis not present

## 2014-06-19 DIAGNOSIS — Z5181 Encounter for therapeutic drug level monitoring: Secondary | ICD-10-CM | POA: Diagnosis not present

## 2014-06-19 DIAGNOSIS — F319 Bipolar disorder, unspecified: Secondary | ICD-10-CM | POA: Diagnosis not present

## 2014-06-19 DIAGNOSIS — G5600 Carpal tunnel syndrome, unspecified upper limb: Secondary | ICD-10-CM | POA: Diagnosis not present

## 2014-06-19 DIAGNOSIS — D72829 Elevated white blood cell count, unspecified: Secondary | ICD-10-CM | POA: Diagnosis not present

## 2014-06-19 DIAGNOSIS — G8929 Other chronic pain: Secondary | ICD-10-CM | POA: Diagnosis not present

## 2014-06-19 DIAGNOSIS — G894 Chronic pain syndrome: Secondary | ICD-10-CM | POA: Diagnosis not present

## 2014-06-19 DIAGNOSIS — R3129 Other microscopic hematuria: Secondary | ICD-10-CM | POA: Diagnosis not present

## 2014-06-19 DIAGNOSIS — M47812 Spondylosis without myelopathy or radiculopathy, cervical region: Secondary | ICD-10-CM | POA: Diagnosis not present

## 2014-06-19 DIAGNOSIS — M5020 Other cervical disc displacement, unspecified cervical region: Secondary | ICD-10-CM | POA: Diagnosis not present

## 2014-06-19 DIAGNOSIS — K449 Diaphragmatic hernia without obstruction or gangrene: Secondary | ICD-10-CM | POA: Diagnosis not present

## 2014-06-19 DIAGNOSIS — F17200 Nicotine dependence, unspecified, uncomplicated: Secondary | ICD-10-CM | POA: Diagnosis not present

## 2014-06-19 DIAGNOSIS — E039 Hypothyroidism, unspecified: Secondary | ICD-10-CM | POA: Diagnosis not present

## 2014-06-19 DIAGNOSIS — IMO0001 Reserved for inherently not codable concepts without codable children: Secondary | ICD-10-CM | POA: Diagnosis not present

## 2014-06-19 NOTE — Telephone Encounter (Signed)
Pt said she had an appt with Dr. Jefm Bryant, and she also went to the pain clinic. Pt said Dr. Jefm Bryant said there was no point in f/u with them since she is going to a pain clinic. Pt said the pain clinic doctor is doing a lot of test (blood test,xray, ect) on her but they told her that they will not prescribe any medication until they get the results back. Pt is requesting a refill of "percocet 10 with out tylenol"

## 2014-06-20 NOTE — Telephone Encounter (Signed)
I need the notes from pain clinic before I can do anything else.  No rx done.

## 2014-06-20 NOTE — Telephone Encounter (Signed)
Patient advised.

## 2014-06-25 ENCOUNTER — Ambulatory Visit: Payer: Self-pay | Admitting: Pain Medicine

## 2014-06-25 DIAGNOSIS — M5137 Other intervertebral disc degeneration, lumbosacral region: Secondary | ICD-10-CM | POA: Diagnosis not present

## 2014-06-25 DIAGNOSIS — K7689 Other specified diseases of liver: Secondary | ICD-10-CM | POA: Diagnosis not present

## 2014-06-25 DIAGNOSIS — M542 Cervicalgia: Secondary | ICD-10-CM | POA: Diagnosis not present

## 2014-06-25 DIAGNOSIS — M546 Pain in thoracic spine: Secondary | ICD-10-CM | POA: Diagnosis not present

## 2014-06-25 DIAGNOSIS — G894 Chronic pain syndrome: Secondary | ICD-10-CM | POA: Diagnosis not present

## 2014-06-25 DIAGNOSIS — R911 Solitary pulmonary nodule: Secondary | ICD-10-CM | POA: Diagnosis not present

## 2014-06-25 DIAGNOSIS — M25519 Pain in unspecified shoulder: Secondary | ICD-10-CM | POA: Diagnosis not present

## 2014-06-25 DIAGNOSIS — M4120 Other idiopathic scoliosis, site unspecified: Secondary | ICD-10-CM | POA: Diagnosis not present

## 2014-06-25 DIAGNOSIS — M47817 Spondylosis without myelopathy or radiculopathy, lumbosacral region: Secondary | ICD-10-CM | POA: Diagnosis not present

## 2014-06-25 LAB — BASIC METABOLIC PANEL WITH GFR
Anion Gap: 6 — ABNORMAL LOW
BUN: 10 mg/dL
Calcium, Total: 10.1 mg/dL
Chloride: 106 mmol/L
Co2: 28 mmol/L
Creatinine: 1.01 mg/dL
EGFR (African American): >60
EGFR (Non-African Amer.): >60
Glucose: 91 mg/dL
Osmolality: 278
Potassium: 4.1 mmol/L
Sodium: 140 mmol/L

## 2014-06-25 LAB — HEPATIC FUNCTION PANEL A (ARMC)
Albumin: 3.9 g/dL
Alkaline Phosphatase: 109 U/L
Bilirubin, Direct: <0.1 mg/dL
Bilirubin,Total: 0.3 mg/dL
SGOT(AST): 30 U/L
SGPT (ALT): 33 U/L
Total Protein: 7.4 g/dL

## 2014-06-25 LAB — MAGNESIUM: Magnesium: 1.9 mg/dL

## 2014-06-25 LAB — SEDIMENTATION RATE: Erythrocyte Sed Rate: 10 mm/h

## 2014-07-01 ENCOUNTER — Encounter: Payer: Self-pay | Admitting: Family Medicine

## 2014-07-09 DIAGNOSIS — F4542 Pain disorder with related psychological factors: Secondary | ICD-10-CM | POA: Diagnosis not present

## 2014-07-10 ENCOUNTER — Telehealth: Payer: Self-pay | Admitting: Family Medicine

## 2014-07-10 MED ORDER — FIRST-DUKES MOUTHWASH MT SUSP: 5.0000 mL | Freq: Three times a day (TID) | OROMUCOSAL | Status: DC | PRN

## 2014-07-10 NOTE — Telephone Encounter (Signed)
Patient Information:  Caller Name: Candyce  Phone: [phone]  Patient: Leslie Wong  Gender: Female  DOB: 1956/01/01  Age: 58 Years  PCP: Elsie Stain Brigitte Pulse) [facility])  Pregnant: No  Office Follow Up:  Does the office need to follow up with this patient?: Yes  Instructions For The Office: see notes  RN Note:  Assured her will send message with script request for MD review and someone will call her back today to f/u. Agreed to plan. (requesting script for [facility] Magic Mouthwash)  Symptoms  Reason For Call & Symptoms: Mouth with generalized rawness and sensitive inside. Denies any ulcers or sores or bleeding. No other sxs. States she has had this many times before (she is unable to state what causes this for her) and MD has always prescribed [facility] Magic Mouthwash. She is requesting a script.  Reviewed Health History In EMR: Yes  Reviewed Medications In EMR: Yes  Reviewed Allergies In EMR: Yes  Reviewed Surgeries / Procedures: Yes  Date of Onset of Symptoms: 07/07/2014 OB / GYN:  LMP: Unknown  Guideline(s) Used:  Mouth Injury  Disposition Per Guideline:   Home Care  Reason For Disposition Reached:   Minor mouth burn from hot food or drink (mouth pain or lip pain)  Advice Given:  Reassurance  Here is some care advice that should help.  Diet  Eat a soft diet.  Avoid any spicy, hot, salty, or citrus foods that might sting.  Call Back If:  Area looks infected (mainly increasing pain or swelling after 48 hours) (Caution: any healing wound in the mouth is normally white for several days).  Fever occurs  You become worse.  Patient Will Follow Care Advice:  YES

## 2014-07-10 NOTE — Telephone Encounter (Signed)
Sent!

## 2014-07-10 NOTE — Telephone Encounter (Signed)
Patient advised.

## 2014-07-14 ENCOUNTER — Telehealth: Payer: Self-pay | Admitting: *Deleted

## 2014-07-14 ENCOUNTER — Ambulatory Visit: Payer: Self-pay | Admitting: Pain Medicine

## 2014-07-14 DIAGNOSIS — M25519 Pain in unspecified shoulder: Secondary | ICD-10-CM | POA: Diagnosis not present

## 2014-07-14 DIAGNOSIS — K589 Irritable bowel syndrome without diarrhea: Secondary | ICD-10-CM | POA: Diagnosis not present

## 2014-07-14 DIAGNOSIS — M5020 Other cervical disc displacement, unspecified cervical region: Secondary | ICD-10-CM | POA: Diagnosis not present

## 2014-07-14 DIAGNOSIS — G894 Chronic pain syndrome: Secondary | ICD-10-CM | POA: Diagnosis not present

## 2014-07-14 DIAGNOSIS — K141 Geographic tongue: Secondary | ICD-10-CM | POA: Diagnosis not present

## 2014-07-14 DIAGNOSIS — K219 Gastro-esophageal reflux disease without esophagitis: Secondary | ICD-10-CM | POA: Diagnosis not present

## 2014-07-14 DIAGNOSIS — M5030 Other cervical disc degeneration, unspecified cervical region: Secondary | ICD-10-CM | POA: Diagnosis not present

## 2014-07-14 DIAGNOSIS — R911 Solitary pulmonary nodule: Secondary | ICD-10-CM | POA: Diagnosis not present

## 2014-07-14 DIAGNOSIS — F17200 Nicotine dependence, unspecified, uncomplicated: Secondary | ICD-10-CM | POA: Diagnosis not present

## 2014-07-14 DIAGNOSIS — IMO0002 Reserved for concepts with insufficient information to code with codable children: Secondary | ICD-10-CM | POA: Diagnosis not present

## 2014-07-14 DIAGNOSIS — R5382 Chronic fatigue, unspecified: Secondary | ICD-10-CM | POA: Diagnosis not present

## 2014-07-14 DIAGNOSIS — J438 Other emphysema: Secondary | ICD-10-CM | POA: Diagnosis not present

## 2014-07-14 DIAGNOSIS — R259 Unspecified abnormal involuntary movements: Secondary | ICD-10-CM | POA: Diagnosis not present

## 2014-07-14 DIAGNOSIS — K449 Diaphragmatic hernia without obstruction or gangrene: Secondary | ICD-10-CM | POA: Diagnosis not present

## 2014-07-14 DIAGNOSIS — G5600 Carpal tunnel syndrome, unspecified upper limb: Secondary | ICD-10-CM | POA: Diagnosis not present

## 2014-07-14 DIAGNOSIS — R091 Pleurisy: Secondary | ICD-10-CM | POA: Diagnosis not present

## 2014-07-14 DIAGNOSIS — N6019 Diffuse cystic mastopathy of unspecified breast: Secondary | ICD-10-CM | POA: Diagnosis not present

## 2014-07-14 DIAGNOSIS — IMO0001 Reserved for inherently not codable concepts without codable children: Secondary | ICD-10-CM | POA: Diagnosis not present

## 2014-07-14 DIAGNOSIS — F319 Bipolar disorder, unspecified: Secondary | ICD-10-CM | POA: Diagnosis not present

## 2014-07-14 DIAGNOSIS — G9332 Myalgic encephalomyelitis/chronic fatigue syndrome: Secondary | ICD-10-CM | POA: Diagnosis not present

## 2014-07-14 DIAGNOSIS — D72829 Elevated white blood cell count, unspecified: Secondary | ICD-10-CM | POA: Diagnosis not present

## 2014-07-14 DIAGNOSIS — K3184 Gastroparesis: Secondary | ICD-10-CM | POA: Diagnosis not present

## 2014-07-14 DIAGNOSIS — G43909 Migraine, unspecified, not intractable, without status migrainosus: Secondary | ICD-10-CM | POA: Diagnosis not present

## 2014-07-14 DIAGNOSIS — E039 Hypothyroidism, unspecified: Secondary | ICD-10-CM | POA: Diagnosis not present

## 2014-07-14 DIAGNOSIS — M47812 Spondylosis without myelopathy or radiculopathy, cervical region: Secondary | ICD-10-CM | POA: Diagnosis not present

## 2014-07-14 DIAGNOSIS — N35919 Unspecified urethral stricture, male, unspecified site: Secondary | ICD-10-CM | POA: Diagnosis not present

## 2014-07-14 DIAGNOSIS — G8929 Other chronic pain: Secondary | ICD-10-CM | POA: Diagnosis not present

## 2014-07-14 NOTE — Telephone Encounter (Signed)
Can they give [name] a list of possibles?  Thanks.

## 2014-07-14 NOTE — Telephone Encounter (Signed)
Received a fax from pharmacy showing that insurance plan does not cover [facility] Mouthwash. Pharmacy request an alternative medication. [facility]/[place]

## 2014-07-14 NOTE — Telephone Encounter (Signed)
Left VM in general mailbox of pharmacy.

## 2014-07-15 ENCOUNTER — Other Ambulatory Visit: Payer: Self-pay | Admitting: *Deleted

## 2014-07-15 NOTE — Telephone Encounter (Signed)
Very difficult to get through to talk to a pharmacy representative.  Detailed note faxed.

## 2014-07-16 ENCOUNTER — Telehealth: Payer: Self-pay

## 2014-07-16 NOTE — Telephone Encounter (Signed)
Pt left v/m; pt has been seen at [facility] pain clinic;pain clinic wanted to do needles in her back; pt said it did not help 20 years ago and pt is not going thru that again. Pt was advised by pain clinic to contact PCP for pain med; pt request percocet 10 mg; pain clinic will not give pt pain medication. Pt states hurting from her head to her toes; cannot function.Pt is waiting on appt at head pain clinic; pt does not have any idea when that appt will be. Pt request cb.

## 2014-07-16 NOTE — Telephone Encounter (Signed)
The point of the pain clinic eval was to get an option other than narcotics.  I'll have to consider her case in the meantime.

## 2014-07-17 NOTE — Telephone Encounter (Signed)
I reviewed her chart.  Based on our conversation from from 06/05/14, I will not refill the oxycodone. I put in the note from 06/05/14- "I will not refill after this; she'll have to keep appointment with ortho for her shoulder and pain clinic re: her back."

## 2014-07-17 NOTE — Telephone Encounter (Signed)
No answer, no VM

## 2014-07-21 NOTE — Telephone Encounter (Signed)
Spoke with patient.  Patient says the pain clinic wants her to take the injections in her lower back and will not give pain medication until that's done.  Patient says she had injections 20 years ago and it didn't help and she refuses to do it again.  Leslie Wong says she did everything that you asked her to do but she is worse off now than in the beginning.  I explained that there has likely been a great deal of improvement in the injections now from 20 years ago and that it might be beneficial to her now.  Patient says she still refuses the injections.

## 2014-07-21 NOTE — Telephone Encounter (Signed)
We had talked about this prev.  Long term opiate tx is not ideal and she would need to follow through with pain clinic recs.  I'll defer to pain clinic and patient at this point.  I don't seen anything else to do on our end.

## 2014-07-29 ENCOUNTER — Other Ambulatory Visit: Payer: Self-pay | Admitting: *Deleted

## 2014-07-29 MED ORDER — FIRST-DUKES MOUTHWASH MT SUSP: 5.0000 mL | Freq: Three times a day (TID) | OROMUCOSAL | Status: DC | PRN

## 2014-07-29 NOTE — Telephone Encounter (Signed)
Please close if this has been taken care of.

## 2014-07-30 ENCOUNTER — Other Ambulatory Visit: Payer: Self-pay | Admitting: Family Medicine

## 2014-08-06 ENCOUNTER — Telehealth: Payer: Self-pay

## 2014-08-06 DIAGNOSIS — H00019 Hordeolum externum unspecified eye, unspecified eyelid: Secondary | ICD-10-CM | POA: Diagnosis not present

## 2014-08-06 NOTE — Telephone Encounter (Signed)
Dr Stark Jock at [facility] in [place] is going to prescribe med for pt and pt did not have allergy list and request list faxed to [phone]. Faxed manually as requested.

## 2014-08-13 DIAGNOSIS — H0019 Chalazion unspecified eye, unspecified eyelid: Secondary | ICD-10-CM | POA: Diagnosis not present

## 2014-08-20 NOTE — Telephone Encounter (Signed)
Noted  

## 2014-08-20 NOTE — Telephone Encounter (Signed)
Patient notified as instructed by telephone. Patient stated that she is very unhappy with the pain clinic doctor and is going to report him to the medical board. Patient stated that she is not going to have injections and will figure out something else to do.

## 2014-09-01 ENCOUNTER — Ambulatory Visit: Payer: Self-pay | Admitting: Physician Assistant

## 2014-09-01 DIAGNOSIS — M25569 Pain in unspecified knee: Secondary | ICD-10-CM | POA: Diagnosis not present

## 2014-09-01 DIAGNOSIS — S63509A Unspecified sprain of unspecified wrist, initial encounter: Secondary | ICD-10-CM | POA: Diagnosis not present

## 2014-09-01 DIAGNOSIS — S99919A Unspecified injury of unspecified ankle, initial encounter: Secondary | ICD-10-CM | POA: Diagnosis not present

## 2014-09-01 DIAGNOSIS — E039 Hypothyroidism, unspecified: Secondary | ICD-10-CM | POA: Diagnosis not present

## 2014-09-01 DIAGNOSIS — K219 Gastro-esophageal reflux disease without esophagitis: Secondary | ICD-10-CM | POA: Diagnosis not present

## 2014-09-01 DIAGNOSIS — S8000XA Contusion of unspecified knee, initial encounter: Secondary | ICD-10-CM | POA: Diagnosis not present

## 2014-09-01 DIAGNOSIS — Z79899 Other long term (current) drug therapy: Secondary | ICD-10-CM | POA: Diagnosis not present

## 2014-09-01 DIAGNOSIS — S99929A Unspecified injury of unspecified foot, initial encounter: Secondary | ICD-10-CM | POA: Diagnosis not present

## 2014-09-01 DIAGNOSIS — S8990XA Unspecified injury of unspecified lower leg, initial encounter: Secondary | ICD-10-CM | POA: Diagnosis not present

## 2014-09-01 DIAGNOSIS — S59909A Unspecified injury of unspecified elbow, initial encounter: Secondary | ICD-10-CM | POA: Diagnosis not present

## 2014-09-01 DIAGNOSIS — S9030XA Contusion of unspecified foot, initial encounter: Secondary | ICD-10-CM | POA: Diagnosis not present

## 2014-09-09 DIAGNOSIS — F3130 Bipolar disorder, current episode depressed, mild or moderate severity, unspecified: Secondary | ICD-10-CM | POA: Diagnosis not present

## 2014-09-09 DIAGNOSIS — F489 Nonpsychotic mental disorder, unspecified: Secondary | ICD-10-CM | POA: Diagnosis not present

## 2014-09-09 DIAGNOSIS — F411 Generalized anxiety disorder: Secondary | ICD-10-CM | POA: Diagnosis not present

## 2014-09-25 ENCOUNTER — Telehealth: Payer: Self-pay

## 2014-09-25 DIAGNOSIS — M79643 Pain in unspecified hand: Secondary | ICD-10-CM

## 2014-09-25 NOTE — Telephone Encounter (Signed)
Pt left v/m; on 08/31/14 pt had accident with injury to lt hand;lt hand still giving pt problems; feels like electricity going thru her hand and pt request referral to [facility]; pt request cb.

## 2014-09-26 NOTE — Telephone Encounter (Signed)
Done. thanks

## 2014-10-07 DIAGNOSIS — M25532 Pain in left wrist: Secondary | ICD-10-CM | POA: Diagnosis not present

## 2014-10-07 DIAGNOSIS — S52502A Unspecified fracture of the lower end of left radius, initial encounter for closed fracture: Secondary | ICD-10-CM | POA: Diagnosis not present

## 2014-10-13 ENCOUNTER — Other Ambulatory Visit: Payer: Self-pay | Admitting: Family Medicine

## 2014-10-14 NOTE — Telephone Encounter (Signed)
Received refill request electronically. Last refill on Zofran 07/30/14 #20, last refill on Valtrex 03/24/14 #30/5, last office visit 06/05/14.  See allergy/contraindication. Is it okay to refill medication?

## 2014-10-14 NOTE — Telephone Encounter (Signed)
Sent, okay to continue.  Thanks.

## 2014-10-27 DIAGNOSIS — S52502A Unspecified fracture of the lower end of left radius, initial encounter for closed fracture: Secondary | ICD-10-CM | POA: Diagnosis not present

## 2014-10-27 DIAGNOSIS — M25532 Pain in left wrist: Secondary | ICD-10-CM | POA: Diagnosis not present

## 2014-10-27 DIAGNOSIS — S52502P Unspecified fracture of the lower end of left radius, subsequent encounter for closed fracture with malunion: Secondary | ICD-10-CM | POA: Diagnosis not present

## 2014-11-05 ENCOUNTER — Other Ambulatory Visit: Payer: Self-pay | Admitting: Family Medicine

## 2014-11-06 NOTE — Telephone Encounter (Signed)
Sent. Thanks.   

## 2014-11-06 NOTE — Telephone Encounter (Signed)
Received refill request electronically from pharmacy. Last refill 10/14/14 #30, last office visit 06/05/14. Is it okay to refill medication?

## 2014-11-17 DIAGNOSIS — M654 Radial styloid tenosynovitis [de Quervain]: Secondary | ICD-10-CM | POA: Diagnosis not present

## 2014-11-17 DIAGNOSIS — S52502A Unspecified fracture of the lower end of left radius, initial encounter for closed fracture: Secondary | ICD-10-CM | POA: Diagnosis not present

## 2014-11-17 DIAGNOSIS — M25532 Pain in left wrist: Secondary | ICD-10-CM | POA: Diagnosis not present

## 2014-11-21 ENCOUNTER — Other Ambulatory Visit: Payer: Self-pay | Admitting: Family Medicine

## 2014-12-08 DIAGNOSIS — M654 Radial styloid tenosynovitis [de Quervain]: Secondary | ICD-10-CM | POA: Diagnosis not present

## 2014-12-08 DIAGNOSIS — S52502A Unspecified fracture of the lower end of left radius, initial encounter for closed fracture: Secondary | ICD-10-CM | POA: Diagnosis not present

## 2014-12-09 ENCOUNTER — Other Ambulatory Visit: Payer: Self-pay | Admitting: Family Medicine

## 2014-12-10 NOTE — Telephone Encounter (Signed)
Received refill request electronically from pharmacy. Last refill 11/21/14 #30, last office visit 06/05/14. Is it okay to refill medication?

## 2014-12-11 NOTE — Telephone Encounter (Signed)
Sent. Thanks.   

## 2014-12-24 ENCOUNTER — Other Ambulatory Visit: Payer: Self-pay | Admitting: Family Medicine

## 2014-12-24 NOTE — Telephone Encounter (Signed)
Received refill request electronically from pharmacy. Last refill 10/14/14 #20, last office visit 06/05/14. Is it okay to refill medication?

## 2014-12-25 NOTE — Telephone Encounter (Signed)
Sent. Thanks.   

## 2014-12-30 DIAGNOSIS — M654 Radial styloid tenosynovitis [de Quervain]: Secondary | ICD-10-CM | POA: Diagnosis not present

## 2014-12-30 DIAGNOSIS — M5412 Radiculopathy, cervical region: Secondary | ICD-10-CM | POA: Diagnosis not present

## 2015-01-20 DIAGNOSIS — F4312 Post-traumatic stress disorder, chronic: Secondary | ICD-10-CM | POA: Diagnosis not present

## 2015-02-12 DIAGNOSIS — H00022 Hordeolum internum right lower eyelid: Secondary | ICD-10-CM | POA: Diagnosis not present

## 2015-02-18 ENCOUNTER — Encounter: Payer: Self-pay | Admitting: Family Medicine

## 2015-02-18 ENCOUNTER — Ambulatory Visit: Payer: Self-pay | Admitting: Family Medicine

## 2015-02-18 ENCOUNTER — Ambulatory Visit (INDEPENDENT_AMBULATORY_CARE_PROVIDER_SITE_OTHER): Payer: Medicare Other | Admitting: Family Medicine

## 2015-02-18 VITALS — BP 128/84 | HR 98 | Temp 98.8°F | Wt 125.5 lb

## 2015-02-18 DIAGNOSIS — K625 Hemorrhage of anus and rectum: Secondary | ICD-10-CM | POA: Diagnosis not present

## 2015-02-18 DIAGNOSIS — R1032 Left lower quadrant pain: Secondary | ICD-10-CM | POA: Diagnosis not present

## 2015-02-18 DIAGNOSIS — N289 Disorder of kidney and ureter, unspecified: Secondary | ICD-10-CM | POA: Diagnosis not present

## 2015-02-18 DIAGNOSIS — R1084 Generalized abdominal pain: Secondary | ICD-10-CM

## 2015-02-18 DIAGNOSIS — K314 Gastric diverticulum: Secondary | ICD-10-CM | POA: Diagnosis not present

## 2015-02-18 DIAGNOSIS — R1013 Epigastric pain: Secondary | ICD-10-CM | POA: Diagnosis not present

## 2015-02-18 MED ORDER — METRONIDAZOLE 500 MG PO TABS: 500.0000 mg | ORAL_TABLET | Freq: Three times a day (TID) | ORAL | Status: DC

## 2015-02-18 MED ORDER — CIPROFLOXACIN HCL 500 MG PO TABS: 500.0000 mg | ORAL_TABLET | Freq: Two times a day (BID) | ORAL | Status: DC

## 2015-02-18 NOTE — Assessment & Plan Note (Addendum)
Concern for diverticulitis.  Not a acute abd, but uncomfortable.  Start cipro flagyl and check cmet/cbc today.  Sent for CT abd pelvis.  If worsening pain, then to ER.  She agrees.  D/w pt about ddx and rationale.  She agrees.  Will ask for call report on CT.  >25 minutes spent in face to face time with patient, >50% spent in counselling or coordination of care.   Late entry, call report from CT- no diverticulitis.  D/w pt.  No cause for abd pain seen.  I question if she could have early colitis, enough to cause sx but not enough to show up on CT.  Would proceed with abx, will await her labs, will have her update [name] Thursday (tomorrow).  D/w pt by phone, she agrees with plan.

## 2015-02-18 NOTE — Patient Instructions (Signed)
Go to the lab on the way out.  We'll contact you with your lab report. Rosaria Ferries will call about your referral to get the CT scan set up.  Start cipro and flagyl in the meantime.  If the pain gets much worse, then go to the ER.  Clear liquid diet for now.  Avoid high fiber foods for now.

## 2015-02-18 NOTE — Progress Notes (Signed)
Pre visit review using our clinic review tool, if applicable. No additional management support is needed unless otherwise documented below in the visit note.  Was constipated recently.  Had a BM yesterday, then blood per rectum, bright red.  Not painful.   More abd pain recently, started yesterday.  No fevers known, felt hot, had sweats.  No vomiting.   6 episodes of passing blood yesterday, none today.   No black stools.   No burning with urination.   "Stabbing" lower abd pain and also in the upper abd.    Had a normal BM today.  The pain is not better today.  She only ate a small cracker earlier this AM.   She had seen the pain clinic but didn't want to follow up there again.    PMH and SH reviewed  ROS: See HPI, otherwise noncontributory.  Meds, vitals, and allergies reviewed.   nad but uncomfortable Mmm Neck supple. No LA rrr ctab abd soft, B upper abd ttp but w/o rebound. Less tender in the BLQs.  Normal BS Ext w/o edema

## 2015-02-19 ENCOUNTER — Telehealth: Payer: Self-pay | Admitting: Family Medicine

## 2015-02-19 LAB — CBC WITH DIFFERENTIAL/PLATELET
BASOS ABS: 0.1 10*3/uL (ref 0.0–0.1)
Basophils Relative: 0.4 % (ref 0.0–3.0)
Eosinophils Absolute: 0.3 10*3/uL (ref 0.0–0.7)
Eosinophils Relative: 2.1 % (ref 0.0–5.0)
HCT: 45.7 % (ref 36.0–46.0)
HEMOGLOBIN: 15.5 g/dL — AB (ref 12.0–15.0)
LYMPHS PCT: 18.3 % (ref 12.0–46.0)
Lymphs Abs: 2.9 10*3/uL (ref 0.7–4.0)
MCHC: 33.9 g/dL (ref 30.0–36.0)
MCV: 95.5 fl (ref 78.0–100.0)
MONOS PCT: 6.5 % (ref 3.0–12.0)
Monocytes Absolute: 1 10*3/uL (ref 0.1–1.0)
Neutro Abs: 11.3 10*3/uL — ABNORMAL HIGH (ref 1.4–7.7)
Neutrophils Relative %: 72.7 % (ref 43.0–77.0)
PLATELETS: 288 10*3/uL (ref 150.0–400.0)
RBC: 4.78 Mil/uL (ref 3.87–5.11)
RDW: 14 % (ref 11.5–15.5)
WBC: 15.6 10*3/uL — ABNORMAL HIGH (ref 4.0–10.5)

## 2015-02-19 LAB — COMPREHENSIVE METABOLIC PANEL
ALT: 18 U/L (ref 0–35)
AST: 20 U/L (ref 0–37)
Albumin: 4.5 g/dL (ref 3.5–5.2)
Alkaline Phosphatase: 114 U/L (ref 39–117)
BILIRUBIN TOTAL: 0.3 mg/dL (ref 0.2–1.2)
BUN: 9 mg/dL (ref 6–23)
CHLORIDE: 106 meq/L (ref 96–112)
CO2: 28 meq/L (ref 19–32)
CREATININE: 1.08 mg/dL (ref 0.40–1.20)
Calcium: 11.6 mg/dL — ABNORMAL HIGH (ref 8.4–10.5)
GFR: 55.73 mL/min — AB (ref >60.00)
GLUCOSE: 83 mg/dL (ref 70–99)
POTASSIUM: 4.5 meq/L (ref 3.5–5.1)
SODIUM: 139 meq/L (ref 135–145)
Total Protein: 7.5 g/dL (ref 6.0–8.3)

## 2015-02-19 NOTE — Telephone Encounter (Signed)
See result note on labs.

## 2015-02-19 NOTE — Telephone Encounter (Signed)
Pt called to let you know she is not in as much pain as yesterday.  She said her pain level was between 7-8   Yesterday was a 10+ Pt has had bowl movement  No blood today Pain she is having now is under right rib at bottom  She checking on labs results  And wanted to know what to do next.

## 2015-02-20 ENCOUNTER — Encounter: Payer: Self-pay | Admitting: Family Medicine

## 2015-02-23 ENCOUNTER — Observation Stay: Payer: Self-pay | Admitting: Internal Medicine

## 2015-02-23 ENCOUNTER — Telehealth: Payer: Self-pay | Admitting: Family Medicine

## 2015-02-23 DIAGNOSIS — Z79899 Other long term (current) drug therapy: Secondary | ICD-10-CM | POA: Diagnosis not present

## 2015-02-23 DIAGNOSIS — R10811 Right upper quadrant abdominal tenderness: Secondary | ICD-10-CM | POA: Diagnosis not present

## 2015-02-23 DIAGNOSIS — M479 Spondylosis, unspecified: Secondary | ICD-10-CM | POA: Diagnosis not present

## 2015-02-23 DIAGNOSIS — F419 Anxiety disorder, unspecified: Secondary | ICD-10-CM | POA: Diagnosis not present

## 2015-02-23 DIAGNOSIS — K219 Gastro-esophageal reflux disease without esophagitis: Secondary | ICD-10-CM | POA: Diagnosis not present

## 2015-02-23 DIAGNOSIS — K559 Vascular disorder of intestine, unspecified: Secondary | ICD-10-CM | POA: Diagnosis not present

## 2015-02-23 DIAGNOSIS — K3189 Other diseases of stomach and duodenum: Secondary | ICD-10-CM | POA: Diagnosis not present

## 2015-02-23 DIAGNOSIS — E039 Hypothyroidism, unspecified: Secondary | ICD-10-CM | POA: Diagnosis not present

## 2015-02-23 DIAGNOSIS — F319 Bipolar disorder, unspecified: Secondary | ICD-10-CM | POA: Diagnosis not present

## 2015-02-23 DIAGNOSIS — M62830 Muscle spasm of back: Secondary | ICD-10-CM | POA: Diagnosis not present

## 2015-02-23 DIAGNOSIS — R1011 Right upper quadrant pain: Secondary | ICD-10-CM | POA: Diagnosis not present

## 2015-02-23 DIAGNOSIS — K760 Fatty (change of) liver, not elsewhere classified: Secondary | ICD-10-CM | POA: Diagnosis not present

## 2015-02-23 DIAGNOSIS — K449 Diaphragmatic hernia without obstruction or gangrene: Secondary | ICD-10-CM | POA: Diagnosis not present

## 2015-02-23 DIAGNOSIS — R790 Abnormal level of blood mineral: Secondary | ICD-10-CM | POA: Diagnosis not present

## 2015-02-23 DIAGNOSIS — K5730 Diverticulosis of large intestine without perforation or abscess without bleeding: Secondary | ICD-10-CM | POA: Diagnosis not present

## 2015-02-23 DIAGNOSIS — K2970 Gastritis, unspecified, without bleeding: Secondary | ICD-10-CM | POA: Diagnosis not present

## 2015-02-23 DIAGNOSIS — R1084 Generalized abdominal pain: Secondary | ICD-10-CM | POA: Diagnosis not present

## 2015-02-23 DIAGNOSIS — D72829 Elevated white blood cell count, unspecified: Secondary | ICD-10-CM | POA: Diagnosis not present

## 2015-02-23 DIAGNOSIS — G8929 Other chronic pain: Secondary | ICD-10-CM | POA: Diagnosis not present

## 2015-02-23 DIAGNOSIS — M549 Dorsalgia, unspecified: Secondary | ICD-10-CM | POA: Diagnosis not present

## 2015-02-23 DIAGNOSIS — F17200 Nicotine dependence, unspecified, uncomplicated: Secondary | ICD-10-CM | POA: Diagnosis not present

## 2015-02-23 DIAGNOSIS — M5430 Sciatica, unspecified side: Secondary | ICD-10-CM | POA: Diagnosis not present

## 2015-02-23 DIAGNOSIS — Z720 Tobacco use: Secondary | ICD-10-CM | POA: Diagnosis not present

## 2015-02-23 DIAGNOSIS — Z8711 Personal history of peptic ulcer disease: Secondary | ICD-10-CM | POA: Diagnosis not present

## 2015-02-23 DIAGNOSIS — K2990 Gastroduodenitis, unspecified, without bleeding: Secondary | ICD-10-CM | POA: Diagnosis not present

## 2015-02-23 DIAGNOSIS — Z888 Allergy status to other drugs, medicaments and biological substances status: Secondary | ICD-10-CM | POA: Diagnosis not present

## 2015-02-23 DIAGNOSIS — N289 Disorder of kidney and ureter, unspecified: Secondary | ICD-10-CM | POA: Diagnosis not present

## 2015-02-23 DIAGNOSIS — Z801 Family history of malignant neoplasm of trachea, bronchus and lung: Secondary | ICD-10-CM | POA: Diagnosis not present

## 2015-02-23 DIAGNOSIS — D123 Benign neoplasm of transverse colon: Secondary | ICD-10-CM | POA: Diagnosis not present

## 2015-02-23 DIAGNOSIS — R1013 Epigastric pain: Secondary | ICD-10-CM | POA: Diagnosis not present

## 2015-02-23 DIAGNOSIS — R109 Unspecified abdominal pain: Secondary | ICD-10-CM | POA: Diagnosis not present

## 2015-02-23 DIAGNOSIS — Z833 Family history of diabetes mellitus: Secondary | ICD-10-CM | POA: Diagnosis not present

## 2015-02-23 NOTE — Telephone Encounter (Signed)
If severe, then agreed with 911.  I was going to check on her later today, but this came through first.  Please try to get ER notes.  Thanks.

## 2015-02-23 NOTE — Telephone Encounter (Signed)
Pt calls in this morning to notify that she is having severe pain and is going to call 911 bc she thinks it is her gallbladder and wanted Dr Damita Dunnings to know.

## 2015-02-24 DIAGNOSIS — K2900 Acute gastritis without bleeding: Secondary | ICD-10-CM | POA: Diagnosis not present

## 2015-02-24 DIAGNOSIS — F419 Anxiety disorder, unspecified: Secondary | ICD-10-CM | POA: Diagnosis not present

## 2015-02-24 DIAGNOSIS — K5792 Diverticulitis of intestine, part unspecified, without perforation or abscess without bleeding: Secondary | ICD-10-CM | POA: Diagnosis not present

## 2015-02-24 DIAGNOSIS — Z716 Tobacco abuse counseling: Secondary | ICD-10-CM | POA: Diagnosis not present

## 2015-02-24 DIAGNOSIS — M5441 Lumbago with sciatica, right side: Secondary | ICD-10-CM | POA: Diagnosis not present

## 2015-02-24 DIAGNOSIS — R1011 Right upper quadrant pain: Secondary | ICD-10-CM | POA: Diagnosis not present

## 2015-02-24 DIAGNOSIS — K2930 Chronic superficial gastritis without bleeding: Secondary | ICD-10-CM | POA: Diagnosis not present

## 2015-02-24 DIAGNOSIS — E039 Hypothyroidism, unspecified: Secondary | ICD-10-CM | POA: Diagnosis not present

## 2015-02-24 DIAGNOSIS — K2950 Unspecified chronic gastritis without bleeding: Secondary | ICD-10-CM | POA: Diagnosis not present

## 2015-02-24 DIAGNOSIS — D72829 Elevated white blood cell count, unspecified: Secondary | ICD-10-CM | POA: Diagnosis not present

## 2015-02-24 DIAGNOSIS — K3189 Other diseases of stomach and duodenum: Secondary | ICD-10-CM | POA: Diagnosis not present

## 2015-02-24 DIAGNOSIS — D123 Benign neoplasm of transverse colon: Secondary | ICD-10-CM | POA: Diagnosis not present

## 2015-02-24 DIAGNOSIS — R109 Unspecified abdominal pain: Secondary | ICD-10-CM | POA: Diagnosis not present

## 2015-02-24 DIAGNOSIS — K219 Gastro-esophageal reflux disease without esophagitis: Secondary | ICD-10-CM | POA: Diagnosis not present

## 2015-02-24 DIAGNOSIS — R1013 Epigastric pain: Secondary | ICD-10-CM | POA: Diagnosis not present

## 2015-02-24 DIAGNOSIS — M62830 Muscle spasm of back: Secondary | ICD-10-CM | POA: Diagnosis not present

## 2015-02-24 DIAGNOSIS — K449 Diaphragmatic hernia without obstruction or gangrene: Secondary | ICD-10-CM | POA: Diagnosis not present

## 2015-02-24 DIAGNOSIS — K5730 Diverticulosis of large intestine without perforation or abscess without bleeding: Secondary | ICD-10-CM | POA: Diagnosis not present

## 2015-02-24 HISTORY — PX: ESOPHAGOGASTRODUODENOSCOPY: SHX1529

## 2015-02-24 NOTE — Telephone Encounter (Signed)
Daughter says patient was admitted.  Will request Discharge Summary at a later date if not sent.

## 2015-02-25 DIAGNOSIS — K2900 Acute gastritis without bleeding: Secondary | ICD-10-CM | POA: Diagnosis not present

## 2015-02-25 DIAGNOSIS — K5730 Diverticulosis of large intestine without perforation or abscess without bleeding: Secondary | ICD-10-CM | POA: Diagnosis not present

## 2015-02-25 DIAGNOSIS — K3189 Other diseases of stomach and duodenum: Secondary | ICD-10-CM | POA: Diagnosis not present

## 2015-02-25 DIAGNOSIS — D123 Benign neoplasm of transverse colon: Secondary | ICD-10-CM | POA: Diagnosis not present

## 2015-02-25 DIAGNOSIS — D72829 Elevated white blood cell count, unspecified: Secondary | ICD-10-CM | POA: Diagnosis not present

## 2015-02-25 DIAGNOSIS — K449 Diaphragmatic hernia without obstruction or gangrene: Secondary | ICD-10-CM | POA: Diagnosis not present

## 2015-02-25 DIAGNOSIS — M5441 Lumbago with sciatica, right side: Secondary | ICD-10-CM | POA: Diagnosis not present

## 2015-02-25 DIAGNOSIS — F419 Anxiety disorder, unspecified: Secondary | ICD-10-CM | POA: Diagnosis not present

## 2015-02-25 DIAGNOSIS — Z716 Tobacco abuse counseling: Secondary | ICD-10-CM | POA: Diagnosis not present

## 2015-02-25 DIAGNOSIS — K219 Gastro-esophageal reflux disease without esophagitis: Secondary | ICD-10-CM | POA: Diagnosis not present

## 2015-02-25 DIAGNOSIS — E039 Hypothyroidism, unspecified: Secondary | ICD-10-CM | POA: Diagnosis not present

## 2015-02-25 DIAGNOSIS — R109 Unspecified abdominal pain: Secondary | ICD-10-CM | POA: Diagnosis not present

## 2015-02-25 DIAGNOSIS — R1011 Right upper quadrant pain: Secondary | ICD-10-CM | POA: Diagnosis not present

## 2015-02-25 DIAGNOSIS — R1013 Epigastric pain: Secondary | ICD-10-CM | POA: Diagnosis not present

## 2015-02-25 DIAGNOSIS — M62830 Muscle spasm of back: Secondary | ICD-10-CM | POA: Diagnosis not present

## 2015-02-26 DIAGNOSIS — F419 Anxiety disorder, unspecified: Secondary | ICD-10-CM | POA: Diagnosis not present

## 2015-02-26 DIAGNOSIS — K5730 Diverticulosis of large intestine without perforation or abscess without bleeding: Secondary | ICD-10-CM | POA: Diagnosis not present

## 2015-02-26 DIAGNOSIS — R1011 Right upper quadrant pain: Secondary | ICD-10-CM | POA: Diagnosis not present

## 2015-02-26 DIAGNOSIS — R1013 Epigastric pain: Secondary | ICD-10-CM | POA: Diagnosis not present

## 2015-02-26 DIAGNOSIS — M5441 Lumbago with sciatica, right side: Secondary | ICD-10-CM | POA: Diagnosis not present

## 2015-02-26 DIAGNOSIS — R109 Unspecified abdominal pain: Secondary | ICD-10-CM | POA: Diagnosis not present

## 2015-02-26 DIAGNOSIS — K449 Diaphragmatic hernia without obstruction or gangrene: Secondary | ICD-10-CM | POA: Diagnosis not present

## 2015-02-26 DIAGNOSIS — K2900 Acute gastritis without bleeding: Secondary | ICD-10-CM | POA: Diagnosis not present

## 2015-02-26 DIAGNOSIS — K3189 Other diseases of stomach and duodenum: Secondary | ICD-10-CM | POA: Diagnosis not present

## 2015-02-26 DIAGNOSIS — D72829 Elevated white blood cell count, unspecified: Secondary | ICD-10-CM | POA: Diagnosis not present

## 2015-02-26 DIAGNOSIS — M62830 Muscle spasm of back: Secondary | ICD-10-CM | POA: Diagnosis not present

## 2015-02-26 DIAGNOSIS — D123 Benign neoplasm of transverse colon: Secondary | ICD-10-CM | POA: Diagnosis not present

## 2015-02-26 DIAGNOSIS — K219 Gastro-esophageal reflux disease without esophagitis: Secondary | ICD-10-CM | POA: Diagnosis not present

## 2015-02-26 DIAGNOSIS — Z716 Tobacco abuse counseling: Secondary | ICD-10-CM | POA: Diagnosis not present

## 2015-02-26 DIAGNOSIS — E039 Hypothyroidism, unspecified: Secondary | ICD-10-CM | POA: Diagnosis not present

## 2015-02-27 DIAGNOSIS — Z716 Tobacco abuse counseling: Secondary | ICD-10-CM | POA: Diagnosis not present

## 2015-02-27 DIAGNOSIS — M4184 Other forms of scoliosis, thoracic region: Secondary | ICD-10-CM | POA: Diagnosis not present

## 2015-02-27 DIAGNOSIS — R1011 Right upper quadrant pain: Secondary | ICD-10-CM | POA: Diagnosis not present

## 2015-02-27 DIAGNOSIS — K2900 Acute gastritis without bleeding: Secondary | ICD-10-CM | POA: Diagnosis not present

## 2015-02-27 DIAGNOSIS — E039 Hypothyroidism, unspecified: Secondary | ICD-10-CM | POA: Diagnosis not present

## 2015-02-27 DIAGNOSIS — K5730 Diverticulosis of large intestine without perforation or abscess without bleeding: Secondary | ICD-10-CM | POA: Diagnosis not present

## 2015-02-27 DIAGNOSIS — M5441 Lumbago with sciatica, right side: Secondary | ICD-10-CM | POA: Diagnosis not present

## 2015-02-27 DIAGNOSIS — F419 Anxiety disorder, unspecified: Secondary | ICD-10-CM | POA: Diagnosis not present

## 2015-02-27 DIAGNOSIS — D123 Benign neoplasm of transverse colon: Secondary | ICD-10-CM | POA: Diagnosis not present

## 2015-02-27 DIAGNOSIS — R1013 Epigastric pain: Secondary | ICD-10-CM | POA: Diagnosis not present

## 2015-02-27 DIAGNOSIS — M5136 Other intervertebral disc degeneration, lumbar region: Secondary | ICD-10-CM | POA: Diagnosis not present

## 2015-02-27 DIAGNOSIS — M549 Dorsalgia, unspecified: Secondary | ICD-10-CM | POA: Diagnosis not present

## 2015-02-27 DIAGNOSIS — D72829 Elevated white blood cell count, unspecified: Secondary | ICD-10-CM | POA: Diagnosis not present

## 2015-02-27 DIAGNOSIS — K219 Gastro-esophageal reflux disease without esophagitis: Secondary | ICD-10-CM | POA: Diagnosis not present

## 2015-02-27 DIAGNOSIS — M4186 Other forms of scoliosis, lumbar region: Secondary | ICD-10-CM | POA: Diagnosis not present

## 2015-02-27 DIAGNOSIS — M62830 Muscle spasm of back: Secondary | ICD-10-CM | POA: Diagnosis not present

## 2015-02-27 DIAGNOSIS — K449 Diaphragmatic hernia without obstruction or gangrene: Secondary | ICD-10-CM | POA: Diagnosis not present

## 2015-02-27 DIAGNOSIS — R109 Unspecified abdominal pain: Secondary | ICD-10-CM | POA: Diagnosis not present

## 2015-02-27 DIAGNOSIS — M47897 Other spondylosis, lumbosacral region: Secondary | ICD-10-CM | POA: Diagnosis not present

## 2015-02-27 DIAGNOSIS — K3189 Other diseases of stomach and duodenum: Secondary | ICD-10-CM | POA: Diagnosis not present

## 2015-02-27 DIAGNOSIS — R1084 Generalized abdominal pain: Secondary | ICD-10-CM | POA: Diagnosis not present

## 2015-02-27 DIAGNOSIS — M47896 Other spondylosis, lumbar region: Secondary | ICD-10-CM | POA: Diagnosis not present

## 2015-02-27 LAB — HM COLONOSCOPY

## 2015-03-12 ENCOUNTER — Encounter: Payer: Self-pay | Admitting: Family Medicine

## 2015-03-16 ENCOUNTER — Ambulatory Visit (INDEPENDENT_AMBULATORY_CARE_PROVIDER_SITE_OTHER): Payer: Medicare Other | Admitting: Family Medicine

## 2015-03-16 ENCOUNTER — Encounter: Payer: Self-pay | Admitting: Family Medicine

## 2015-03-16 VITALS — BP 118/82 | HR 93 | Temp 98.8°F | Wt 122.2 lb

## 2015-03-16 DIAGNOSIS — G8929 Other chronic pain: Secondary | ICD-10-CM

## 2015-03-16 DIAGNOSIS — M549 Dorsalgia, unspecified: Secondary | ICD-10-CM

## 2015-03-16 NOTE — Progress Notes (Signed)
Pre visit review using our clinic review tool, if applicable. No additional management support is needed unless otherwise documented below in the visit note.  Recent admission with abd pain.  She was told the pain she was having was likely from her back.  She didn't want injections for her back- she had seen pain clinic and declined intervention/injections.  She was open to the possibility of surgery.  She was asking about options.  We had talked prev. It is likely not a good idea to leave her long term opiates w/o other attempts at interventions.  No FCNAVD.  No jaundice.    Back pain continues.  B (R>L) lower back pain with radicular pain (R>L leg pain).  No B/B sx.  No rash.    I called Dr. Gustavo Lah with GI and he noted that it would be possible for her prev abd sx at admission to be radicular in origin.   Meds, vitals, and allergies reviewed.   ROS: See HPI.  Otherwise, noncontributory.  nad but uncomfortable from back pain Mmm Neck supple rrr ctab Abd soft, not ttp Ext w/o edema

## 2015-03-16 NOTE — Patient Instructions (Signed)
I'll check with Dr. Gustavo Lah in the meantime and I'll get the records from Dr. Dossie Arbour.  We'll go from there.  I'll try to get you set up with Dr. Lacy Duverney clinic.  We may need to do extra imaging in the meantime.  Take care.

## 2015-03-19 ENCOUNTER — Encounter: Payer: Self-pay | Admitting: Family Medicine

## 2015-03-19 NOTE — Assessment & Plan Note (Signed)
I will refer to neurosurgery, but will need prev imaging reviewed/collected.  I will request records from Dr. Dossie Arbour and we'll go from there.  I didn't change her meds today.  If she hasn't had a recent MRI, then we may need to get that done before referral is placed.  D/w pt.  She agrees.  She continues to have back pain, but no new/emergent sx.  She is still okay for outpatient f/u.  >25 minutes spent in face to face time with patient, >50% spent in counselling or coordination of care.

## 2015-03-25 ENCOUNTER — Telehealth: Payer: Self-pay | Admitting: Family Medicine

## 2015-03-25 DIAGNOSIS — M545 Low back pain: Secondary | ICD-10-CM

## 2015-03-25 NOTE — Telephone Encounter (Signed)
After making several phone calls found out that the medical release form went to the wrong office. Patient saw the pain management doctor at [facility] before he left that group. Records received by fax and on your desk.

## 2015-03-25 NOTE — Telephone Encounter (Signed)
We requested her old records from the pain clinic.  They replied that they didn't have records on the patient.  I just got that response in and I'm going to ask for help clarifying this.   The plan was to refer her once we had all the records.  I'm okay putting the referral anyway, but I don't know what the spine clinic will offer in the meantime.  I placed the referral.

## 2015-03-25 NOTE — Telephone Encounter (Signed)
Pt called in asking about a referral to a back surgeon.  She saw a pain specialist and does not want to see him anymore, he wanted to do injections that pt was against due to the side effects.   She has not had any medications for pain and is having trouble coping.Pt requests call back at [phone]. Thanks.

## 2015-03-26 NOTE — Telephone Encounter (Signed)
I'll review the records.  The referral is already in.  Thanks.

## 2015-03-30 ENCOUNTER — Encounter: Payer: Self-pay | Admitting: Family Medicine

## 2015-03-30 ENCOUNTER — Ambulatory Visit (INDEPENDENT_AMBULATORY_CARE_PROVIDER_SITE_OTHER): Payer: Medicare Other | Admitting: Family Medicine

## 2015-03-30 VITALS — BP 122/80 | HR 104 | Temp 98.7°F | Wt 120.5 lb

## 2015-03-30 DIAGNOSIS — G8929 Other chronic pain: Secondary | ICD-10-CM | POA: Diagnosis not present

## 2015-03-30 DIAGNOSIS — M549 Dorsalgia, unspecified: Secondary | ICD-10-CM

## 2015-03-30 DIAGNOSIS — M545 Low back pain: Secondary | ICD-10-CM | POA: Diagnosis not present

## 2015-03-30 NOTE — Assessment & Plan Note (Signed)
She has mult complaints.  It is reasonable to recheck MRI.  I reiterated that I didn't have a good cause to start long term opiates.  This was a discussion we'd had prev.  Still okay for outpatient f/u.  We'll get the MRI done and then proceed from there.  Prev referred to neurosurgery re: low back pain per patient request.  >25 minutes spent in face to face time with patient, >50% spent in counselling or coordination of care

## 2015-03-30 NOTE — Progress Notes (Signed)
Pre visit review using our clinic review tool, if applicable. No additional management support is needed unless otherwise documented below in the visit note.  "It feels like somebody if braiding my muscles and they are stinging and burning."  "Upper back pain from the neck to my feet".  Her lower back hurts the most.  "I can't function."  She has used ice.  Not on opiates.  Lower back pain continues, no abd pain now.  No FCNAVD.  Dec in appetite noted due to pain.  Mult med intolerances.  I have reviewed outside records from pain clinic.  She didn't have recent MRI.  D/w pt.    Meds, vitals, and allergies reviewed.   ROS: See HPI.  Otherwise, noncontributory.  nad Speech at baseline, fluent Mmm rrr Ctab Complains of pain to the light touch, with the stethoscope on her anterior chest wall for auscultation.  Back diffusely tender to touch per patient report during exam, in paraspinal muscles and midline of back DTRs wnl for the BLE.  I couldn't get her to relax enough to get reliable BUE DTRs Gait with variable limp noted. No gross weakness in the BLE

## 2015-03-30 NOTE — Patient Instructions (Signed)
Rosaria Ferries will call about your referral for the MRI.  We'll get that done and then go from there.  Take care.

## 2015-04-02 ENCOUNTER — Encounter: Payer: Self-pay | Admitting: Family Medicine

## 2015-04-03 ENCOUNTER — Ambulatory Visit
Admit: 2015-04-03 | Disposition: A | Discharge status: Home / Self Care | Payer: Self-pay | Attending: Family Medicine | Admitting: Family Medicine

## 2015-04-03 ENCOUNTER — Encounter: Payer: Self-pay | Admitting: Family Medicine

## 2015-04-03 DIAGNOSIS — M9973 Connective tissue and disc stenosis of intervertebral foramina of lumbar region: Secondary | ICD-10-CM | POA: Diagnosis not present

## 2015-04-03 DIAGNOSIS — M47816 Spondylosis without myelopathy or radiculopathy, lumbar region: Secondary | ICD-10-CM | POA: Diagnosis not present

## 2015-04-03 DIAGNOSIS — M4316 Spondylolisthesis, lumbar region: Secondary | ICD-10-CM | POA: Diagnosis not present

## 2015-04-03 DIAGNOSIS — M5126 Other intervertebral disc displacement, lumbar region: Secondary | ICD-10-CM | POA: Diagnosis not present

## 2015-04-03 DIAGNOSIS — M4806 Spinal stenosis, lumbar region: Secondary | ICD-10-CM | POA: Diagnosis not present

## 2015-04-06 DIAGNOSIS — F4312 Post-traumatic stress disorder, chronic: Secondary | ICD-10-CM | POA: Diagnosis not present

## 2015-04-06 DIAGNOSIS — F319 Bipolar disorder, unspecified: Secondary | ICD-10-CM | POA: Diagnosis not present

## 2015-04-07 ENCOUNTER — Encounter: Payer: Self-pay | Admitting: Family Medicine

## 2015-04-13 DIAGNOSIS — F319 Bipolar disorder, unspecified: Secondary | ICD-10-CM | POA: Diagnosis not present

## 2015-04-20 ENCOUNTER — Telehealth: Payer: Self-pay | Admitting: Family Medicine

## 2015-04-20 ENCOUNTER — Other Ambulatory Visit: Payer: Self-pay | Admitting: Family Medicine

## 2015-04-20 LAB — SURGICAL PATHOLOGY

## 2015-04-20 NOTE — Telephone Encounter (Signed)
Received a call from [facility] from [facility]. After reviewing your Referral, Their Dr Vertell Limber declined to take thias patient and no other Dr there will see her either. Called patient she now wants to see if she can go to [facility]. Will use the same Referral and will now try [facility].

## 2015-04-20 NOTE — Telephone Encounter (Signed)
Sent. Thanks.   

## 2015-04-20 NOTE — Telephone Encounter (Signed)
Duly noted, I thank all involved.

## 2015-04-20 NOTE — Telephone Encounter (Signed)
Received refill request electronically. Last refill 12/25/14 #20, last office visit 03/30/15. Is it okay to refill medication?

## 2015-04-22 ENCOUNTER — Encounter: Payer: Self-pay | Admitting: Family Medicine

## 2015-04-26 NOTE — Consult Note (Signed)
Chief Complaint:  Subjective/Chief Complaint seen for abdominal pain.   Please see colonoscopy report.  No reasone for abd pain elicudated.  Case discussed with Dr Volanda Napoleon, recommend t+l spinal films, possible radiculopathy.   Continue ppi, path from egd still pending.  Patient with h/o chronic back pain, has seen pain management in the past. May need to arrange for o/p consult.   Electronic Signatures: Loistine Simas (MD)  (Signed 04-Mar-16 15:12)  Authored: Chief Complaint   Last Updated: 04-Mar-16 15:12 by Loistine Simas (MD)

## 2015-04-26 NOTE — Consult Note (Signed)
Brief Consult Note: Diagnosis: RUQ pain.   Patient was seen by consultant.   Consult note dictated.   Recommend further assessment or treatment.   Discussed with Attending MD.   Comments: Agree with HIDA but suspect that this may be PUD in pt with known reflux. May need GI consult. Will follow.  Electronic Signatures: Florene Glen (MD)  (Signed [phone] 16:59)  Authored: Brief Consult Note   Last Updated: 29-Feb-16 16:59 by Florene Glen (MD)

## 2015-04-26 NOTE — H&P (Signed)
PATIENT NAME:  Leslie Wong, UMPHLETT MR#:  [id number] DATE OF BIRTH:  [birth year]  DATE OF ADMISSION:  02/23/2015  PRIMARY CARE PHYSICIAN: Dr. Damita Dunnings  REFERRING PHYSICIAN: Dr. Joni Fears  CHIEF COMPLAINT: Abdominal pain for 5 days.   HISTORY OF PRESENT ILLNESS: A 59 year old Caucasian female with a history of GERD, depression, chronic back pain. Presented to the ED with above chief complaint. The patient is alert, awake, oriented, in no acute distress. The patient has had abdominal pain for the past 5 days. Initially it was diffuse abdominal pain, in the right upper quadrant. The patient also has nausea and vomiting, but no diarrhea. No melena or bloody stool. The patient went to PCPs office, got a CAT scan of the abdomen and pelvis, which was negative for diverticulitis or any abnormality. The patient has been treated with Cipro for the past 5 days without improvement so the patient comes to ED for further evaluation. The patient got an ultrasound of her abdomen which is negative. Dr. Joni Fears discussed with general surgeon, Dr. Burt Knack, who suggests get a HIDA scan.  PAST MEDICAL HISTORY: Chronic back pain, GERD, anxiety and depression, hypothyroidism, bipolar disorder.   PAST SURGICAL HISTORY: Hysterectomy, appendectomy, tonsillectomy and adenoidectomy.   SOCIAL HISTORY: Smoke 5 cigarettes a day for 30 years. Denies any alcohol drinking or illicit drugs.   FAMILY HISTORY: Father had lung cancer and mother has diabetes and gallbladder removed.   ALLERGIES: ASPIRIN, BACLOFEN, NSAIDS, CHANTIX, CODEINE, DEPAKOTE, DOXYCYCLINE, ERYTHROMYCIN, GABAPENTIN, LYRICA.  HOME MEDICATIONS: Vitamin E 200 international units 1 cap once a day, vitamin D3 5,000 international units 1 tablet twice a day, vitamin B12 50 mcg p.o. b.i.d., Valacyclovir 500 mg p.o. b.i.d., triamcinolone topical 0.025% topical cream apply topically to effected area 2 to 3 times a week, Zofran 4 mg p.o. once a day p.r.n., omeprazole 20 mg p.o.  once a day, multivitamin 1 tablet once a day, Flagyl 500 mg t.i.d., lorazepam 1 mg p.o. t.i.d. p.r.n., lecithin 150 mg 3 capsules once a day at bedtime, levothyroxine 88 mcg p.o. daily, Gelnique 10% transdermal gel 1 patch transdermal once a day, Cipro 500 mg p.o. b.i.d., bupropion 300 mg 1 tab every 24 hours, black cohosh 40 mg p.o. b.i.d.   REVIEW OF SYSTEMS: CONSTITUTIONAL: The patient denies any fever or chills. No headache or dizziness but has weakness and poor appetite.  EYES: No double vision, blurred vision.  EARS, NOSE, AND THROAT: No postnasal drip, slurred speech or dysphagia.  CARDIOVASCULAR: No chest pain, palpitation, orthopnea, or nocturnal dyspnea. No leg edema.  PULMONARY: No cough, sputum, shortness of breath, or hemoptysis. GASTROINTESTINAL: Positive for abdominal pain, nausea, and vomiting, but no diarrhea. No melena or bloody stool.  GENITOURINARY: No dysuria, hematuria, or incontinence but has dark urine.  NEUROLOGY: No syncope, loss of consciousness, or seizure.  ENDOCRINOLOGY: No polyuria, polydipsia, heat or cold intolerance. HEME AND LYMPH: No easy bruising or bleeding.   PHYSICAL EXAMINATION: VITAL SIGNS: Temperature 98.7, blood pressure 136/85, pulse 76, oxygen saturation 98% in room air.  GENERAL: The patient is alert, awake, and oriented, in no acute distress.  HEENT: Pupils round, equal and reactive to light and accommodation. Moist oral mucosa. Clear oropharynx.  NECK: Supple. No JVD or carotid bruit. No lymphadenopathy. No thyromegaly.  CARDIOVASCULAR: S1 and S2 regular rate and rhythm. No murmurs or gallops.  PULMONARY: Bilateral air entry. No wheezing or rales. No use of accessory muscles to breathe.  ABDOMEN: Soft. Tenderness in epigastric area and right upper quadrant. No  rigidity. No rebound. No organomegaly Bowel sounds present.  EXTREMITIES: No edema, clubbing or cyanosis. No calf tenderness. Bilateral pedal pulses present.  SKIN: No rash or jaundice.   NEUROLOGIC: Alert and oriented x3. No focal deficit. Power 5/5. Sensation intact.   DIAGNOSTIC DATA: Glucose 119, BUN 10, creatinine 1.07. Electrolytes normal. Liver function tests are normal. WBC 13, hemoglobin 14.1, platelets 233,000. Urinalysis is negative.  Abdominal ultrasound: Probable fatty infiltrate of liver with suspected focal sparing adjacent to gallbladder fossa, otherwise negative.  Lipase 138.  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  IMPRESSION: 1.  Intractable abdominal pain. Need to rule out cholecystitis and gastritis.  2.  Leukocytosis, possibly due to reaction.  3.  Tobacco abuse.  4.  History of gastroesophageal reflux disease.  PLAN OF TREATMENT: 1.  The patient will be placed for observation. We will continue pain control with Percocet. The patient says she is allergic to morphine, Tylenol and NSAIDs.  2.  Will get HIDA scan and get a gastroenterology consultation for possible endoscopy.  3.  I will give Protonix 40 mg b.i.d. 4.  Smoking cessation. Was counseled for 5 minutes. Will give a nicotine patch.   I discussed the patient's condition and plan of treatment with the patient.   TIME SPENT: About 52 minutes.  ____________________________ Demetrios Loll, MD qc:sb D: 02/23/2015 14:14:56 ET T: 02/23/2015 14:39:52 ET JOB#: [id number]  cc: Demetrios Loll, MD, <Dictator> Demetrios Loll MD ELECTRONICALLY SIGNED 02/25/2015 16:54

## 2015-04-26 NOTE — Consult Note (Signed)
Brief Consult Note: Diagnosis: abdominalpain, mostly ruq.   Patient was seen by consultant.   Consult note dictated.   Recommend further assessment or treatment.   Orders entered.   Comments: Please see full GI consult [phone].  Patietn admitted with ruq pain/generalized abdominalpain.  [name] and CT uninformative.  Patient seen by surgery-no acute abdomen, agree with rec for hepatobiliary scan to r.o cystic duct obstruction.  Recommend EGD after that is resulted depending on result.  Continue ppi as you are.  Following.  Electronic Signatures: Loistine Simas (MD)  (Signed 29-Feb-16 19:48)  Authored: Brief Consult Note   Last Updated: 29-Feb-16 19:48 by Loistine Simas (MD)

## 2015-04-26 NOTE — Consult Note (Signed)
Chief Complaint:  Subjective/Chief Complaint seen for abdominalpain.  some nausea today, no emesis, continues with ruq abdominal pain, somewhat less, minimal generalized discomfort.   VITAL SIGNS/ANCILLARY NOTES: **Vital Signs.:   01-Mar-16 12:39  Vital Signs Type Post-Procedure  Temperature Temperature (F) 98  Temperature Source oral  Pulse Pulse 64  Respirations Respirations 18  Systolic BP Systolic BP 119  Diastolic BP (mmHg) Diastolic BP (mmHg) 76  Mean BP 90  Pulse Ox % Pulse Ox % 94  Pulse Ox Activity Level  At rest  Oxygen Delivery Room Air/ 21 %   Brief Assessment:  Cardiac Regular   Respiratory clear BS   Gastrointestinal details normal Soft  Nondistended  No masses palpable  Bowel sounds normal  No rebound tenderness  mild ruq discomfort to palpation   Lab Results: Routine Hem:  29-Feb-16 09:43   Platelet Count (CBC) 233 (Result(s) reported on 23 Feb 2015 at 10:29AM.)   Radiology Results: [name]:    29-Feb-16 10:37, US Abdomen Limited Survey  US Abdomen Limited Survey   REASON FOR EXAM:    ruq pain, tender +murphy x5days  COMMENTS:   May transport without cardiac monitor    PROCEDURE: [name]  - US ABDOMEN LIMITED SURVEY  - Feb 23 2015 10:37AM     CLINICAL DATA:  RIGHT upper quadrant pain and tenderness, positive  Murphysign, symptoms for 5 days    EXAM:  US ABDOMEN LIMITED - RIGHT UPPER QUADRANT    COMPARISON:  CT abdomen and pelvis 02/23/2015    FINDINGS:  Gallbladder:  Normally distended without stones or wall thickening.    No pericholecystic fluid or sonographic Murphy sign.    Common bile duct:    Diameter: Upper normal caliber 6 mm diameter.    Liver:    Echogenic, likely fatty infiltration, though this can be seen with  cirrhosis and certain infiltrative disorders. Focus of probable  fatty sparing adjacent to gallbladder fossa. Hepatic margins smooth  without irregularity or nodularity. No additional mass lesion.  Hepatopetal portal venous  flow.  No RIGHT upper quadrant free fluid.     IMPRESSION:  Probable fatty infiltration of liver with suspected focal sparing  adjacent to gallbladder fossa.    Otherwise negative exam.      Electronically Signed    By: Lavonia Dana M.D.    On: 02/23/2015 11:17         Verified By: Burnetta Sabin, M.D.,  Nuclear Med:    01-Mar-16 12:07, Hepatobiliary W/GB Ejec Fraction -NM  Hepatobiliary W/GB Ejec Fraction -NM   REASON FOR EXAM:    abd pain  COMMENTS:       PROCEDURE: NM  - NM HEPATO WITH GB EJECT FRACTION  - Feb 24 2015 12:07PM     CLINICAL DATA:  Abdominal pain for 1 week    EXAM:  NUCLEAR MEDICINE HEPATOBILIARY IMAGING WITH GALLBLADDER EF    TECHNIQUE:  Sequential images of the abdomen were obtained out to 60 minutes  following intravenous administration of radiopharmaceutical. After  slow intravenous infusion of 1.08 micrograms Cholecystokinin,  gallbladder ejection fraction was determined.  RADIOPHARMACEUTICALS:  4.8 [employer] ER-74Y Choletec    COMPARISON:  Ultrasound 02/23/2015    FINDINGS:  There is normal uptake of the tracer by the liver. CBD and  gallbladder visualized at 20 min. Bowel activity noted at 30 min.  Post CCK gallbladder ejection fraction is 85%.. At 45 min, normal  ejection fraction is greater than 40%.    The patient  did experience mild cramping symptoms during CCK  infusion which subsided. .     IMPRESSION:  No cystic duct obstruction. No CBD obstruction. Post CCK gallbladder  ejection fraction is 85%.      Electronically Signed    By: Lahoma Crocker M.D.    On: 02/24/2015 12:46         Verified By: Ephraim Hamburger, M.D.,   Assessment/Plan:  Assessment/Plan:  Assessment 1) abdominalpain, mostly ruq.  some improvement today.  HIDA negative, CCK normal EF.   Plan 1) will proceed with egd today.  I have discussed the risks benefits and complications of egd to include not limited to bleeding infection perforation and sedation and she  wishes to proceed.   further recs to follow.   Electronic Signatures: Loistine Simas (MD)  (Signed 01-Mar-16 14:42)  Authored: Chief Complaint, VITAL SIGNS/ANCILLARY NOTES, Brief Assessment, Lab Results, Radiology Results, Assessment/Plan   Last Updated: 01-Mar-16 14:42 by Loistine Simas (MD)

## 2015-04-26 NOTE — Consult Note (Signed)
Chief Complaint:  Subjective/Chief Complaint patient with continued abdominalpain.  at bedtime issue today with back spasm pain. Continues with abd pian 7/10, mostly epigastric/upper abdomen.   no nausea or emesis, tolerated clears today, had a regular tray last night but ate little.   VITAL SIGNS/ANCILLARY NOTES: **Vital Signs.:   03-Mar-16 15:31  Vital Signs Type Routine  Temperature Temperature (F) 100.1  Temperature Source oral  Pulse Pulse 80  Respirations Respirations 18  Systolic BP Systolic BP 509  Diastolic BP (mmHg) Diastolic BP (mmHg) 63  Mean BP 77  Pulse Ox % Pulse Ox % 93  Pulse Ox Activity Level  At rest  Oxygen Delivery Room Air/ 21 %  *Intake and Output.:   03-Mar-16 18:18  Stool  small formed   Brief Assessment:  Cardiac Regular   Respiratory clear BS   Gastrointestinal details normal Soft  Nondistended  Bowel sounds normal  No rebound tenderness  tender to palpation epigastrum   Lab Results: Routine Chem:  03-Mar-16 04:56   Glucose, Serum 95  BUN 7  Creatinine (comp) 0.91  Sodium, Serum 144  Potassium, Serum  3.2  Chloride, Serum  109  CO2, Serum 30  Calcium (Total), Serum 9.9  Anion Gap  5  Osmolality (calc) 285  eGFR (African American) >60  eGFR (Non-African American) >60 (eGFR values <100m/min/1.73 m2 may be an indication of chronic kidney disease (CKD). Calculated eGFR, using the MRDR Study equation, is useful in  patients with stable renal function. The eGFR calculation will not be reliable in acutely ill patients when serum creatinine is changing rapidly. It is not useful in patients on dialysis. The eGFR calculation may not be applicable to patients at the low and high extremes of body sizes, pregnant women, and vegetarians.)  Routine Hem:  03-Mar-16 04:56   WBC (CBC) 9.4  RBC (CBC) 4.40  Hemoglobin (CBC) 13.9  Hematocrit (CBC) 42.7  Platelet Count (CBC) 226  MCV 97  MCH 31.7  MCHC 32.7  RDW 13.5  Neutrophil % 58.4  Lymphocyte  % 29.9  Monocyte % 8.6  Eosinophil % 2.5  Basophil % 0.6  Neutrophil # 5.5  Lymphocyte # 2.8  Monocyte # 0.8  Eosinophil # 0.2  Basophil # 0.1 (Result(s) reported on 26 Feb 2015 at 06:15AM.)   Assessment/Plan:  Assessment/Plan:  Assessment 1) epigastric and some ruq abdominalpain.  imaging and egd not informative.  some small amount of non-repeated rectal bleeding PTA.   probable minimal fatty liver.  2) back spasm today, requiring meds, h/o chronic back issues at home.   Plan 1) will proceed with colonoscopy tomorrow.  I have discussed the risks benefits and complications of proceedure to include not limited to bleeding infection perforation and sedation and she wishes to proceed.    further recs to follow.   Electronic Signatures: SLoistine Simas(MD)  (Signed 03-Mar-16 20:37)  Authored: Chief Complaint, VITAL SIGNS/ANCILLARY NOTES, Brief Assessment, Lab Results, Assessment/Plan   Last Updated: 03-Mar-16 20:37 by SLoistine Simas(MD)

## 2015-04-26 NOTE — Discharge Summary (Signed)
PATIENT NAME:  Leslie Wong, Leslie Wong MR#:  [id number] DATE OF BIRTH:  10-18-1956  DATE OF ADMISSION:  02/23/2015 DATE OF DISCHARGE: 02/27/2015  PRESENTING COMPLAINT: Right upper quadrant abdominal pain.   DISCHARGE DIAGNOSES:  1.  Persistent right upper quadrant abdominal pain with gastrointestinal etiologies excluded, possibly radicular pain from osteoarthritis of the spine.  2.  Ongoing tobacco abuse.  3.  Bipolar disorder.  4.  Hypothyroidism.  5.  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  Gastroesophageal reflux disease.  7.  Fatty liver disease.  CONSULTATIONS:  1.  Lollie Sails, MD, gastroenterology  2.  Richard E. Burt Knack, MD, surgery   PROCEDURES:  1.  Ultrasound of the abdomen February 29 shows probable fatty infiltration of the liver with suspected focal sparing adjacent to the gallbladder fossa. Otherwise negative.  2.  Hepatobiliary imaging with gallbladder ejection fraction shows no cystic duct obstruction. No common bile duct obstruction. Posterior CCK gallbladder ejection fraction 85%, normal study.  3.  Colonoscopy March 4 by Dr. Gustavo Lah showing diverticulosis with no diverticulitis, otherwise no abnormalities.  4.  Upper gastrointestinal endoscopy March 1 by Dr. Gustavo Lah showing minimal inflammation characterized by erythema in the gastric body, diffuse mucosal flattening in the duodenum, small hiatal hernia.   HISTORY OF PRESENT ILLNESS: This 59 year old female with history of gastroesophageal reflux disease, depression, chronic back pain presented to the Emergency Room with right upper quadrant abdominal pain for 5 days. Initially pain was diffuse and now it is localized to the right upper quadrant. She has also had nausea and vomiting. No diarrhea, no melena, or bloody stool. Prior to presentation, she had a CT scan of the abdomen and pelvis which was negative for diverticulitis or any other abnormality. She was treated with ciprofloxacin and Flagyl for 5 days prior  to admission by her primary care physician.   HOSPITAL COURSE BY PROBLEM:  1.  Right upper quadrant abdominal pain: The patient was seen by gastroenterology as well as surgery. She had a HIDA scan which was normal. Abdominal ultrasound was normal, did not show any signs of cholecystitis or cholelithiasis. EGD also normal. Colonoscopy also normal. GI etiologies of her abdominal pain seem to have been fairly well ruled out. It is uncertain as to why she is having this right upper quadrant pain. We are obtaining spinal x-rays prior to discharge to look for possible fracture versus nerve impingement which could cause radicular-type pain. At one point it was thought that she may have a rash in a dermatomal pattern, this is not the case, there is no rash.  2.  Chronic back pain: The patient formerly went to a pain clinic for long-term management of chronic back pain due to osteoarthritis. During the hospitalization she did have a flare of her chronic back pain with symptoms familiar to prior symptoms. She also had noticeable muscle spasm in the lumbar area. X-rays are pending at this time. She will need to follow up in the outpatient setting for long-term management of her chronic back pain.  3.  Ongoing tobacco use: She smokes upwards of 5 cigarettes a day for the past 30 years. Smoking cessation counseling was provided during the hospitalization.  4.  Bipolar disorder: This seemed to be stable throughout the hospitalization. She continues on Wellbutrin and lithium.  4.  Hypothyroidism: She continues on levothyroxine for this. 5.  Anxiety: Continues on lorazepam 1 mg 3 times a day as needed for anxiety. This was her prior regimen as an outpatient.  DISCHARGE PHYSICAL EXAMINATION:  VITAL SIGNS: Temperature 98.5, pulse 71, respirations 18, blood pressure 127/76, oxygenation 97% on room air.  GENERAL: No acute distress.  CARDIOVASCULAR: Regular rate and rhythm. No murmurs, rubs, or gallops. No peripheral  edema. Peripheral pulses 2+.  RESPIRATORY: Lungs are clear to auscultation bilaterally with good air movement. No respiratory distress.  ABDOMEN: Soft. Minimal tenderness in the right upper quadrant. No rebound, minimal guarding, non-obstructed. Bowel sounds normal.  SKIN: No rash noted.  PSYCHIATRIC: The patient alert and oriented x 4 with good insight into her clinical condition.   LABORATORY DATA: Sodium 140, potassium 3.0. This has been repleted and will be checked prior to discharge. Chloride 105, bicarbonate 29, BUN 5, creatinine 1.07, glucose 112, calcium 9.8. Lipase 134. LFTs normal. White blood cell count 9.4, hemoglobin 13.9, platelets 226,000, MCV is 97. UA is negative for signs of infection.   DISCHARGE MEDICATIONS:  1.  Lithium 100 mg 3 capsules once a day at bedtime.  2.  Valacyclovir 500 mg 1 tablet twice a day as needed.  3.  Vitamin E 200 international units 1 capsule once a day.  4.  Bupropion 300 mg 1 tablet every 24 hours.  5.  Gelnique 10% transdermal gel 1 packet once a day.  6.  Lorazepam 1 mg 1 tablet 3 times a day as needed.  7.  Levothyroxine 88 mcg 1 tablet once a day.  8.  Omeprazole 20 mg 1 capsule once a day.  9.  Ondansetron 4 mg 1 tablet once a day.  10.  Multivitamin 1 tablet daily.  11.  Triamcinolone 0.025% topical cream apply to affected area 2 to 3 times a week.  12.  Oxycodone 5 mg 1 tablet every 4 hours as needed for moderate pain. A prescription was given for 12 tablets.   DISPOSITION: The patient is being discharged home with no further home health needs.   CONDITION ON DISCHARGE: Stable.   DISCHARGE INSTRUCTIONS:   DIET: Regular.   ACTIVITY LIMITATIONS: None.  TIME FRAME FOR FOLLOWUP:  Please follow up within 1 to 2 weeks to the primary care physician.   TIME SPENT ON DISCHARGE:  40 minutes.    ____________________________ Earleen Newport. Volanda Napoleon, MD cpw:at D: 02/27/2015 14:58:00 ET T: 02/27/2015 17:27:33 ET JOB#: [id number]  cc: Barnetta Chapel  P. Volanda Napoleon, MD, <Dictator> Aldean Jewett MD ELECTRONICALLY SIGNED 03/07/2015 10:55

## 2015-04-26 NOTE — Consult Note (Signed)
PATIENT NAME:  Leslie Wong, Leslie Wong MR#:  [id number] DATE OF BIRTH:  1956-06-08  DATE OF CONSULTATION:  02/23/2015  REFERRING PHYSICIAN:    Demetrios Loll, MD CONSULTING PHYSICIAN:  Lollie Sails, MD  REASON FOR CONSULTATION: Abdominal pain.   HISTORY OF PRESENT ILLNESS: Ms. Bourget is a 59 year old Caucasian female who states that her symptoms began this past Wednesday morning.  She states she had a bowel movement that was then followed with some mucus and just a small amount of blood. This was not enough to change the color of toilet bowl. She describes this as being a small speck.  Her stool was semi-formed at that time. She was nauseated. She initially had some upper abdominal pain that then seems to become generalized, and then change to centering in the right upper quadrant. There has been no emesis. She went to her primary physician who ordered a CT scan with contrast. That showed no explanation for "left lower quadrant pain or epigastric pain."  There is a periampullary diverticulum without evidence of pancreatitis. There was no diverticulitis. There was a low density left renal lesion, likely benign cyst.  Subsequently, her pain became worse and she came to the Emergency Room.  Thereupon she was admitted to the hospital.  She does have a history of heartburn and reflux for which she takes omeprazole 20 mg daily. There is no dysphagia.  She states that in the past, she has been told she has a "slow digestive system." Testing that she had at that time describes what sounds like a gastric emptying study and likely carries some amount of gastric dysmotility or gastroparesis.  She has a bowel movement usually once a day.  She has seen no black stools.  No blood in the stool with the exception of that noted above. No mucousy stools. She has had a colonoscopy in the past over 8 years ago. There is no history of peptic ulcer disease and did have an EGD in the past as well. Records at [facility] were reviewed to  January of 1999 with no documents found.  She has arranged to have a hepatobiliary study tomorrow and has been seen in surgical consultation without evidence of an acute abdomen.  Of note, she has no history of NSAID use.   GASTROINTESTINAL FAMILY HISTORY: Negative for colorectal cancer or liver disease. Her mother did have peptic ulcer disease.   PAST MEDICAL HISTORY:  She has a history of chronic back pain, gastroesophageal reflux, anxiety and depression, history of hypothyroidism, history of bipolar disorder.   PAST SURGICAL HISTORY: Includes hysterectomy, appendectomy, remote tonsillectomy and adenoidectomy.   SOCIAL HISTORY: She does not drink alcohol or use illicit drugs. She does smoke on a daily basis for over 20 years.   ALLERGIES: Include aspirin, baclofen, Chantix, codeine, Depakote, doxycycline, erythromycin, gabapentin, Lyrica, metoclopramide, morphine, NSAIDs, Nucynta, Nucynta-ER, Phenergan, prednisone, Seroquel, sulfa drugs, Topamax, Vicodin and latex.   OUTPATIENT MEDICATIONS: Include black cohosh 40 mg twice a day, bupropion 300 mg once a day, ciprofloxacin 500 mg once every 12 hours, Gelnique transdermal gel 10%, levothyroxine 88 mcg a day, Lithium 150 mg 3 capsules once a day at bedtime, lorazepam 1 mg 3 times a day, metronidazole 500 mg 1 tablet 3 times a day, multiple vitamin once a day, omeprazole 20 mg daily, ondansetron 4 mg once a day as needed, triamcinolone topical cream, Valacyclovir 500 mg twice a day, vitamin B12 at 50 mcg twice a day, vitamin D3 at 5000 international units twice a  day, vitamin E 200 mg once a day.   REVIEW OF SYSTEMS:  Per admission history and physical, agree with same.   PHYSICAL EXAMINATION:  VITAL SIGNS: Temperature is 98.3, pulse 75, respirations 18, blood pressure 149/89, pulse oximetry 93%.  GENERAL: She is a 59 year old Caucasian female in no acute distress.  HEENT: Normocephalic, atraumatic.  EYES: Anicteric.  NOSE: Septum midline. No  lesions.  OROPHARYNX: No lesions.  NECK: Supple. No JVD. No lymphadenopathy. No thyromegaly.  HEART: Regular rate and rhythm.  LUNGS: Clear.  ABDOMEN: Soft. She is markedly tender to palpation in the medial right upper quadrant. There are no masses or rebound.  There is no apparent organomegaly.  She has a generalized abdominal discomfort otherwise.  EXTREMITIES: No clubbing, cyanosis, or edema.  NEUROLOGICAL: Cranial nerves II through XII grossly intact. Muscle strength bilaterally equal and symmetric.   LABORATORY, DIAGNOSTIC, AND RADIOLOGICAL DATA:  Include the following: She had a glucose 119, BUN 10, creatinine 1.07, sodium 143, potassium 4.0, chloride 112, bicarbonate 27, lipase 138, calcium 10.2.  Hepatic profile normal.  Hemogram showing white count of 13.0, hemoglobin and hematocrit 14.1 and 44.2, platelet count of 233,000.  Urinalysis showed a 2+ blood, trace leukocyte esterase, but only 1 white cell per high-power field.  She did have an imaging CT scan as above.  She had an abdominal ultrasound earlier today showing an echogenic liver, likely fatty infiltration with some suspected focal sparing adjacent to the gallbladder fossa. The common bile duct stated upper normal caliber of 6 mm.  There was normally distended gallbladder without stones or wall thickening. There is no pericholecystic fluid or sonographic Murphy sign.   ASSESSMENT:   Abdominal pain. This seems to be centered in the right upper quadrant. The patient has not been taking NSAIDs.  There is a family history of peptic ulcer disease.  Of note, her ultrasound is not positive for gallstones. There is no evidence of acute cholecystitis. She has been evaluated by surgery, and there is a hepatobiliary scan scheduled for tomorrow morning. She is currently on Protonix 40 mg twice a day; however, she has been taking omeprazole regularly at home.   RECOMMENDATION:   We will await results of HIDA scan to make sure that she does not have  a cystic duct obstruction. Once results of this are known, we will plan for EGD with anesthesia assistance unless other clinical indication. I have discussed the risks, benefits, and complications of this procedure to include, but not limited to bleeding, infection, perforation, and the risk of sedation and she wishes to proceed.      ____________________________ Lollie Sails, MD mus:DT D: 02/23/2015 19:45:45 ET T: 02/23/2015 20:19:14 ET JOB#: [id number]  cc: Lollie Sails, MD, <Dictator> Lollie Sails MD ELECTRONICALLY SIGNED 04/07/2015 14:24

## 2015-04-26 NOTE — Consult Note (Signed)
Chief Complaint:  Subjective/Chief Complaint seen for abdominalpain.   no n/v continues with ruq pain, though now 5/10, improving.  tolerating clears. had small semiformed bm, not recorded.  no blood.   VITAL SIGNS/ANCILLARY NOTES: **Vital Signs.:   02-Mar-16 07:46  Vital Signs Type Routine  Temperature Temperature (F) 97.7  Temperature Source oral  Pulse Pulse 79  Respirations Respirations 18  Systolic BP Systolic BP 323  Diastolic BP (mmHg) Diastolic BP (mmHg) 76  Mean BP 90  Pulse Ox % Pulse Ox % 94  Pulse Ox Activity Level  At rest  Oxygen Delivery Room Air/ 21 %   Brief Assessment:  Cardiac Regular   Respiratory clear BS   Gastrointestinal details normal Soft  Nondistended  No masses palpable  Bowel sounds normal  No rebound tenderness  mild ruq tenderness to palpation.   Lab Results: Hepatic:  02-Mar-16 03:44   Bilirubin, Total 0.3  Alkaline Phosphatase 83  SGPT (ALT) 24  SGOT (AST) 20  Total Protein, Serum  6.1  Albumin, Serum 3.4  Routine Chem:  02-Mar-16 03:44   BUN 7  Creatinine (comp) 0.97  Sodium, Serum 142  Potassium, Serum 3.5  Chloride, Serum  108  CO2, Serum 26  Calcium (Total), Serum 9.4  Osmolality (calc) 280  eGFR (African American) >60  eGFR (Non-African American) >60 (eGFR values <29m/min/1.73 m2 may be an indication of chronic kidney disease (CKD). Calculated eGFR, using the MRDR Study equation, is useful in  patients with stable renal function. The eGFR calculation will not be reliable in acutely ill patients when serum creatinine is changing rapidly. It is not useful in patients on dialysis. The eGFR calculation may not be applicable to patients at the low and high extremes of body sizes, pregnant women, and vegetarians.)  Anion Gap 8  Routine Hem:  29-Feb-16 09:43   WBC (CBC)  13.0  02-Mar-16 03:44   WBC (CBC) 9.0  RBC (CBC) 4.39  Hemoglobin (CBC) 13.8  Hematocrit (CBC) 42.4  Platelet Count (CBC) 215  MCV 97  MCH 31.4  MCHC  32.5  RDW 13.5  Neutrophil % 60.7  Lymphocyte % 27.9  Monocyte % 8.4  Eosinophil % 2.5  Basophil % 0.5  Neutrophil # 5.5  Lymphocyte # 2.5  Monocyte # 0.8  Eosinophil # 0.2  Basophil # 0.0 (Result(s) reported on 25 Feb 2015 at 04:22AM.)   Radiology Results: [place]    29-Feb-16 10:37, [place]  [place]   REASON FOR EXAM:    ruq pain, tender +murphy x5days  COMMENTS:   May transport without cardiac monitor    PROCEDURE: [place] - [place]  - Feb 23 2015 10:37AM     CLINICAL DATA:  RIGHT upper quadrant pain and tenderness, positive  Murphysign, symptoms for 5 days    EXAM:  [place] - RIGHT UPPER QUADRANT    COMPARISON:  CT abdomen and pelvis 02/23/2015    FINDINGS:  Gallbladder:  Normally distended without stones or wall thickening.    No pericholecystic fluid or sonographic Murphy sign.    Common bile duct:    Diameter: Upper normal caliber 6 mm diameter.    Liver:    Echogenic, likely fatty infiltration, though this can be seen with  cirrhosis and certain infiltrative disorders. Focus of probable  fatty sparing adjacent to gallbladder fossa. Hepatic margins smooth  without irregularity or nodularity. No additional mass lesion.  Hepatopetal portal venous flow.  No RIGHT  upper quadrant free fluid.     IMPRESSION:  Probable fatty infiltration of liver with suspected focal sparing  adjacent to gallbladder fossa.    Otherwise negative exam.      Electronically Signed    By: Lavonia Dana M.D.    On: 02/23/2015 11:17         Verified By: Burnetta Sabin, M.D.,  Nuclear Med:    01-Mar-16 12:07, Hepatobiliary W/GB Ejec Fraction -NM  Hepatobiliary W/GB Ejec Fraction -NM   REASON FOR EXAM:    abd pain  COMMENTS:       PROCEDURE: NM  - NM HEPATO WITH GB EJECT FRACTION  - Feb 24 2015 12:07PM     CLINICAL DATA:  Abdominal pain for 1 week    EXAM:  NUCLEAR MEDICINE HEPATOBILIARY IMAGING WITH GALLBLADDER  EF    TECHNIQUE:  Sequential images of the abdomen were obtained out to 60 minutes  following intravenous administration of radiopharmaceutical. After  slow intravenous infusion of 1.08 micrograms Cholecystokinin,  gallbladder ejection fraction was determined.  RADIOPHARMACEUTICALS:  4.8 [employer] OB-09G Choletec    COMPARISON:  Ultrasound 02/23/2015    FINDINGS:  There is normal uptake of the tracer by the liver. CBD and  gallbladder visualized at 20 min. Bowel activity noted at 30 min.  Post CCK gallbladder ejection fraction is 85%.. At 45 min, normal  ejection fraction is greater than 40%.    The patient did experience mild cramping symptoms during CCK  infusion which subsided. .     IMPRESSION:  No cystic duct obstruction. No CBD obstruction. Post CCK gallbladder  ejection fraction is 85%.      Electronically Signed    By: Lahoma Crocker M.D.    On: 02/24/2015 12:46         Verified By: Ephraim Hamburger, M.D.,   Assessment/Plan:  Assessment/Plan:  Assessment 1) abdominal pain, mostly ruq to epigastric of uncertain etiology.  CT last week, [name]  and egd this admission not imformative.  Of note wbc now normal from admission.  pain has improved. Patient otherwise stable.   Plan 1) recommend colonoscopy.  I will not be available tomorrow, patient declined proceedure by one of my partners, I will be able to do friday.  As alternative to staying in the hospital, could d/c home full liquids today, clears tomorrow, prep for o/p colonoscopy on friday.  I have discussed the risks benefits and complications of proceedure to include not limited to bleeding infection perforation and sedation and she wishes to proceed. Will need instructions that if she gets worse in the interim, she is to return to er.   disucssed with Dr Volanda Napoleon.   Electronic Signatures: Loistine Simas (MD)  (Signed 02-Mar-16 15:07)  Authored: Chief Complaint, VITAL SIGNS/ANCILLARY NOTES, Brief Assessment, Lab Results,  Radiology Results, Assessment/Plan   Last Updated: 02-Mar-16 15:07 by Loistine Simas (MD)

## 2015-04-26 NOTE — Consult Note (Signed)
PATIENT NAME:  Leslie Wong, Leslie Wong MR#:  [id number] DATE OF BIRTH:  [birth year]  DATE OF CONSULTATION:  02/23/2015  REFERRING PHYSICIAN:   CONSULTING PHYSICIAN:  Jerrol Banana. Burt Knack, MD   CHIEF COMPLAINT: Abdominal pain.   HISTORY OF PRESENT ILLNESS: This is a patient with 5 days of abdominal pain in the epigastrium and right upper quadrant, radiates through to her back to a certain extent.  She has never had an episode like this before.  She is treated for reflux disease, and has had an episode of peptic ulcer disease, possibly several years ago when she was taking prednisone for chronic back pain.  She denies melena or hematochezia. Has had some nausea, but no emesis. She also states that it started somewhat diffusely, but is now localized in the right upper quadrant and epigastrium. She had a CT scan as an outpatient with a potential diagnosis of diverticulitis which did not hold through on the CT scan, but she was treated with antibiotics prior to that.   The patient denies jaundice or acholic stools. Has never had an episode like this before. Denies fevers or chills.   PAST MEDICAL HISTORY: Chronic back pain, reflux disease, possible peptic ulcer disease in the past, anxiety, hypothyroidism and bipolar disorder. She is disabled.   PAST SURGICAL HISTORY: Total hysterectomy, appendectomy, tonsillectomy, adenoidectomy.   SOCIAL HISTORY: The patient smokes tobacco daily, does not drink alcohol and is disabled due to her back pain.   FAMILY HISTORY: There is a history of gallbladder disease in her mother.   ALLERGIES: MULTIPLE, SEE CHART.   MEDICATIONS: Multiple, see reconciliation.   REVIEW OF SYSTEMS:  A complete system review was performed and negative with the exception of that mentioned in the history of present illness.   PHYSICAL EXAMINATION:  GENERAL: Uncomfortable-appearing female patient.  VITAL SIGNS: She is afebrile. Vital signs are stable.  HEENT: No scleral icterus.  NECK: No  palpable neck nodes.  CHEST: Clear to auscultation.  CARDIAC: Regular rate and rhythm.  ABDOMEN: Soft, nondistended, tender in the epigastrium and right upper quadrant with a negative Murphy sign and no rebound or percussion tenderness.  EXTREMITIES: Show no edema. Calves are nontender.  NEUROLOGIC: Grossly intact.  INTEGUMENT: No jaundice.   DIAGNOSTIC DATA: An ultrasound shows negative sonographic Murphy's sign. No gallbladder wall thickening or pericholecystic fluid, and no stones.   Electrolytes are within normal limits. LFTs are within normal limits.   LABORATORY DATA: White blood cell count is 13.0, hemoglobin and hematocrit 14 and 44 and a platelet count of 233,000.  Urinalysis shows 2+ blood with trace leukocyte esterase.   CT scan is personally reviewed from 02/18/2015 at which time she was referred for left lower quadrant pain and epigastric pain. No acute problems are noted.   ASSESSMENT AND PLAN: This is a patient with right upper quadrant pain.  Initially, it was in the left lower quadrant and then it became diffuse and is now in the right upper quadrant and epigastrium suggestive of gallbladder disease however, her studies including ultrasound and CT are all negative.  I suggested to the Emergency Room physician, when I spoke to Dr. Joni Fears that a HIDA scan would be helpful.  I have spoken to Dr. Bridgett Larsson who has admitted the patient to the hospital and I will be happy to follow the patient while she is while in the hospital in hopes of, identifying the etiology of this.  If this is not gallbladder disease, she would likely require a gastroenterology  consult for possible EGD since she has had a history of an EGD many years ago for possible peptic ulcer disease when she was taking steroids for back pain.      ____________________________ Jerrol Banana. Burt Knack, MD rec:DT D: 02/23/2015 17:44:48 ET T: 02/23/2015 18:54:39 ET JOB#: [id number]  cc: Jerrol Banana. Burt Knack, MD, <Dictator> Florene Glen MD ELECTRONICALLY SIGNED 02/24/2015 7:41

## 2015-04-26 NOTE — Consult Note (Signed)
Chief Complaint:  Subjective/Chief Complaint seen for abdominalpain.  continuew with right sided abdominal pain, mostly ruq.  no n/v tolerated prep for colonoscopy.   VITAL SIGNS/ANCILLARY NOTES: **Vital Signs.:   04-Mar-16 08:30  Vital Signs Type Routine  Temperature Temperature (F) 98.5  Temperature Source oral  Pulse Pulse 71  Respirations Respirations 18  Systolic BP Systolic BP 334  Diastolic BP (mmHg) Diastolic BP (mmHg) 76  Mean BP 93  Pulse Ox % Pulse Ox % 97  Pulse Ox Activity Level  At rest  Oxygen Delivery Room Air/ 21 %   Brief Assessment:  GEN well developed, well nourished   Cardiac Regular   Respiratory clear BS   Gastrointestinal details normal Soft  Bowel sounds normal  No rebound tenderness  mild generalized discomfort, , more focal pain ruq/epigastrum/   Lab Results: Routine Chem:  04-Mar-16 05:07   Glucose, Serum  112  BUN  5  Creatinine (comp) 1.07  Sodium, Serum 140  Potassium, Serum  3.0  Chloride, Serum 105  CO2, Serum 29  Calcium (Total), Serum 9.8  Anion Gap  6  Osmolality (calc) 277  eGFR (African American) >60  eGFR (Non-African American)  56 (eGFR values <71m/min/1.73 m2 may be an indication of chronic kidney disease (CKD). Calculated eGFR, using the MRDR Study equation, is useful in  patients with stable renal function. The eGFR calculation will not be reliable in acutely ill patients when serum creatinine is changing rapidly. It is not useful in patients on dialysis. The eGFR calculation may not be applicable to patients at the low and high extremes of body sizes, pregnant women, and vegetarians.)   Assessment/Plan:  Assessment/Plan:  Assessment 1)  abdominal pain, ude.   2) chronic back pain.   Plan 1) colonoscopy today.  I have discussed the risks benefits and complications of proceedure to include not limited to bleeding infection perforation and sedation and she wishes to proceed.   Electronic Signatures: SLoistine Simas(MD)  (Signed 04-Mar-16 13:26)  Authored: Chief Complaint, VITAL SIGNS/ANCILLARY NOTES, Brief Assessment, Lab Results, Assessment/Plan   Last Updated: 04-Mar-16 13:26 by SLoistine Simas(MD)

## 2015-04-30 DIAGNOSIS — H029 Unspecified disorder of eyelid: Secondary | ICD-10-CM | POA: Diagnosis not present

## 2015-05-31 ENCOUNTER — Other Ambulatory Visit: Payer: Self-pay | Admitting: Family Medicine

## 2015-06-02 ENCOUNTER — Encounter: Payer: Self-pay | Admitting: Primary Care

## 2015-06-02 ENCOUNTER — Ambulatory Visit (INDEPENDENT_AMBULATORY_CARE_PROVIDER_SITE_OTHER): Payer: Medicare Other | Admitting: Primary Care

## 2015-06-02 VITALS — BP 122/78 | HR 81 | Temp 98.0°F | Ht 60.0 in | Wt 114.0 lb

## 2015-06-02 DIAGNOSIS — Z79899 Other long term (current) drug therapy: Secondary | ICD-10-CM | POA: Diagnosis not present

## 2015-06-02 DIAGNOSIS — R5383 Other fatigue: Secondary | ICD-10-CM

## 2015-06-02 LAB — COMPREHENSIVE METABOLIC PANEL
ALBUMIN: 4.1 g/dL (ref 3.5–5.2)
ALK PHOS: 129 U/L — AB (ref 39–117)
ALT: 12 U/L (ref 0–35)
AST: 17 U/L (ref 0–37)
BUN: 5 mg/dL — ABNORMAL LOW (ref 6–23)
CHLORIDE: 106 meq/L (ref 96–112)
CO2: 27 meq/L (ref 19–32)
Calcium: 11.4 mg/dL — ABNORMAL HIGH (ref 8.4–10.5)
Creatinine, Ser: 0.95 mg/dL (ref 0.40–1.20)
GFR: 64.56 mL/min (ref >60.00)
Glucose, Bld: 115 mg/dL — ABNORMAL HIGH (ref 70–99)
Potassium: 3.8 mEq/L (ref 3.5–5.1)
Sodium: 139 mEq/L (ref 135–145)
Total Bilirubin: 0.4 mg/dL (ref 0.2–1.2)
Total Protein: 7.2 g/dL (ref 6.0–8.3)

## 2015-06-02 LAB — CBC
HCT: 41.5 % (ref 36.0–46.0)
Hemoglobin: 13.6 g/dL (ref 12.0–15.0)
MCHC: 32.8 g/dL (ref 30.0–36.0)
MCV: 95.4 fl (ref 78.0–100.0)
Platelets: 305 10*3/uL (ref 150.0–400.0)
RBC: 4.34 Mil/uL (ref 3.87–5.11)
RDW: 14.3 % (ref 11.5–15.5)
WBC: 15.9 10*3/uL — AB (ref 4.0–10.5)

## 2015-06-02 LAB — TSH: TSH: 0.46 u[IU]/mL (ref 0.35–4.50)

## 2015-06-02 NOTE — Patient Instructions (Signed)
Hold your Lithium medication until you hear back from me this afternoon. Complete lab work prior to leaving today. I will notify you of your results. Continue Zofran for nausea as prescribed. Push intake of water and Pedialyte.  I will call you later. It was nice meeting you!  Lithium Toxicity Lithium is a medicine used to treat depression and bipolar disorders. Lithium poisoning (toxicity) occurs when the amount of lithium in the blood is too high. The greater the amount in the blood, the more problems occur. This is common among those taking lithium. This is because the amount of lithium needed to help the disease is not much different than the amount that can cause poisoning. Hence, it is very important to detect signs of toxicity early.  CAUSES  Lithium toxicity can occur for several reasons:  Intentional overdose.  Accidental overdose.  If taking many medicines, confusion can lead to taking too much or not enough of one or more prescriptions.  Even if taking lithium correctly, other medicines can change levels in the blood. Many drugs (such as medicines used for arthritis, some antibiotics, water pills (diuretics), dilantin (used for control of epilepsy), and cyclosporine (used for patients receiving transplants) can increase lithium levels.  If unrelated diseases develop, the amount of lithium in the blood might go up or down.  While working with your caregiver to fine tune your dosing, it is possible that a prescribed dose might be too much. SYMPTOMS  The symptoms of mild-to-moderate lithium toxicity are:   Diarrhea.  Vomiting.  Drowsiness.  Thirst.  Muscle weakness.  Mild shakiness, particularly of the hands.  Frequent urination. The symptoms of severe toxicity are:  Blurred vision.  Giddiness.  Ringing in the ears (Tinnitus).  Seizures.  Increased shakiness.  Severe muscle spasms.  Loss of consciousness or coma. Some people will get symptoms of mild  toxicity even if the level gets close to the top of the normal range. Even if your lab test shows a blood level that is "normal", it is important to tell your caregiver if you are having any of the symptoms noted above. DIAGNOSIS  Your caregiver will look at your signs and symptoms. You will be asked about the dose of lithium you are taking and when the last dose was taken. You will also be asked about how long you have been on the medicine. Then, your caregiver will order blood tests to check your blood lithium level.  TREATMENT  Treatment is mainly supportive.   Mild symptoms can be controlled by reducing or stopping the drug. This can be restarted 24-48 hours later at a lower dose. Your caregiver may recommend changing the daily dose of lithium.  Severe cases are treated by removing lithium from the body. Care is started in a hospital emergency department.  A tube may be placed through the nose or mouth into the stomach. The tube can be used to remove lithium that has not been digested yet. It can also be used to put medicines directly into the stomach to help stop lithium absorption.  Medicines may be used that cause diarrhea. This can help stop absorption of lithium. Your caregiver may suggest cleaning of blood using an artificial kidney (dialysis). This is usually only used for the most severe cases. HOME CARE INSTRUCTIONS   Take the dose of lithium as advised by your caregiver.  Never take more than your advised dose.  Drink plenty of water as dehydration increases the risk of lithium toxicity.  Seek  the advice of your caregiver before starting a low-salt diet as reducing your salt intake can lead to toxicity.  Check your lithium levels in the blood regularly. Tests are generally done 12-18 hours after the last dose. SEEK MEDICAL CARE IF:   You notice any symptoms of lithium toxicity as described above.  You are confused or have questions about how to take your medicines. SEEK  IMMEDIATE MEDICAL CARE IF:   Your symptoms are severe.  You discover that you have accidentally taken too much lithium or too much of any other medicine. MAKE SURE YOU:   Understand these instructions.  Will watch your condition.  Will get help right away if you are not doing well or get worse. Document Released: 10/09/2007 Document Revised: 03/05/2012 Document Reviewed: 10/09/2007 [facility] Patient Information 2015 [place], [place]. This information is not intended to replace advice given to you by your health care provider. Make sure you discuss any questions you have with your health care provider.

## 2015-06-02 NOTE — Progress Notes (Signed)
Pre visit review using our clinic review tool, if applicable. No additional management support is needed unless otherwise documented below in the visit note. 

## 2015-06-02 NOTE — Progress Notes (Signed)
Subjective:    Patient ID: Leslie Wong, female    DOB: 06-30-1956, 59 y.o.   MRN: [id number]  HPI  Leslie Wong is a 59 year old female who presents today with a chief complaint of loss of appetite, weight loss, vomiting, diarrhea, tremors. Her symptoms began about 5/21 after an increase in her Lithium medication from 150 mg three times daily to 300 mg three times daily on 5/19. Her daughter reports she's been lethargic and forgetful. She reports feeling awful and not herself. She's attempted to contact her psychiatrist to notify him of these symptoms but has been unable to make contact.  BP Readings from Last 3 Encounters:  06/02/15 122/78  03/30/15 122/80  03/16/15 118/82   Wt Readings from Last 3 Encounters:  06/02/15 114 lb (51.71 kg)  03/30/15 120 lb 8 oz (54.658 kg)  03/16/15 122 lb 4 oz (55.452 kg)     Review of Systems  Constitutional: Positive for appetite change, fatigue and unexpected weight change. Negative for fever and chills.  HENT: Negative for rhinorrhea.   Respiratory: Negative for cough and shortness of breath.   Cardiovascular: Negative for chest pain.  Gastrointestinal: Positive for nausea, vomiting and diarrhea. Negative for abdominal pain.  Neurological: Positive for dizziness, tremors, weakness and light-headedness. Negative for syncope and numbness.       Past Medical History  Diagnosis Date  . Depression     BAD  . Hypothyroidism   . Back pain   . Gastroparesis   . Insomnia   . GERD (gastroesophageal reflux disease)   . Migraines   . Urge incontinence   . Recurrent cold sores   . Shoulder bursitis     History   Social History  . Marital Status: Divorced    Spouse Name: N/A  . Number of Children: N/A  . Years of Education: N/A   Occupational History  . Not on file.   Social History Main Topics  . Smoking status: Former Smoker -- 0.50 packs/day for 35 years    Types: Cigarettes  . Smokeless tobacco: Former Systems developer  .  Alcohol Use: No  . Drug Use: No  . Sexual Activity: No   Other Topics Concern  . Not on file   Social History Narrative    Past Surgical History  Procedure Laterality Date  . Appendectomy    . Tonsillectomy and adenoidectomy    . Laparoscopy      for endometriosis  . Ovarian cyst removal  1990    unilateral  . Abdominal hysterectomy  1999    total  . Esophagogastroduodenoscopy  01/2006    negative except small hiatal hernia  . Esophagogastroduodenoscopy  02/24/2015    See report    Family History  Problem Relation Age of Onset  . Hypertension Mother   . Arthritis Mother   . Diabetes Mother   . Cancer Father     lung  . Colon cancer Neg Hx   . Breast cancer Neg Hx     Allergies  Allergen Reactions  . Aspirin     REACTION: UNSPECIFIED  . Baclofen     REACTION: Throat swells up  . Chantix [Varenicline Tartrate]     Oral ulcers  . Codeine     REACTION: UNSPECIFIED  . Erythromycin     REACTION: UNSPECIFIED  . Gabapentin     REACTION: throat swells up  . Metoclopramide Hcl     REACTION: states "messed me up"  . Nsaids Other (See  Comments)    Stomach problems  . Nucynta Er [Tapentadol Hcl Er]     Intolerant, see note from 07/24/12.    Gean Birchwood [Tapentadol]     AMS  . Prednisone     GI intolance  . Pregabalin   . Seroquel [Quetiapine Fumerate] Other (See Comments)    Loss control, felt like on another planet  . Sulfa Antibiotics Nausea And Vomiting  . Topamax Other (See Comments)    Blisters in mouth and tongue  . Vicodin [Hydrocodone-Acetaminophen]     Nausea, thrush and constipation    Current Outpatient Prescriptions on File Prior to Visit  Medication Sig Dispense Refill  . buPROPion (WELLBUTRIN XL) 300 MG 24 hr tablet Take 300 mg by mouth daily.      . diphenhydrAMINE (BENADRYL) 25 MG tablet Take 2 by mouth prior to Nucynta     . levothyroxine (SYNTHROID, LEVOTHROID) 88 MCG tablet TAKE 1 TABLET BY MOUTH EVERY DAY 90 tablet 0  . lithium 150 MG  capsule Take 450 mg by mouth at bedtime.      Marland Kitchen LORazepam (ATIVAN) 1 MG tablet Take 1 mg by mouth 3 (three) times daily as needed.     . Multiple Vitamin (MULTIVITAMIN) tablet Take 1 tablet by mouth daily.      Marland Kitchen omeprazole (PRILOSEC) 20 MG capsule TAKE ONE CAPSULE BY MOUTH EVERY MORNING 45 MINUTES BEFORE BREAKFAST 90 capsule 3  . omeprazole (PRILOSEC) 20 MG capsule TAKE ONE CAPSULE BY MOUTH EVERY MORNING 45 MINUTES BEFORE BREAKFAST 30 capsule 5  . ondansetron (ZOFRAN-ODT) 4 MG disintegrating tablet DISSOLVE 1 TABLET BY MOUTH AS NEEDED 20 tablet 0  . OVER THE COUNTER MEDICATION [place] daily for menopausal symptoms.    . Oxybutynin Chloride (GELNIQUE) 10 % GEL Place onto the skin daily.      Marland Kitchen triamcinolone (KENALOG) 0.025 % cream Apply to affected areas two to three times per week for itching only.    . valACYclovir (VALTREX) 500 MG tablet TAKE 1 TABLET BY MOUTH TWICE DAILY AS NEEDED 30 tablet 3  . vitamin E 200 UNIT capsule Take 200 Units by mouth daily.       No current facility-administered medications on file prior to visit.    BP 122/78 mmHg  Pulse 81  Temp(Src) 98 F (36.7 C) (Oral)  Ht 5' (1.524 m)  Wt 114 lb (51.71 kg)  BMI 22.26 kg/m2  SpO2 98%    Objective:   Physical Exam  Constitutional: She is oriented to person, place, and time. She appears distressed.  Neck: Neck supple. No thyromegaly present.  Cardiovascular: Normal rate and regular rhythm.   Pulmonary/Chest: Effort normal and breath sounds normal.  Abdominal: Soft. Bowel sounds are normal. There is no tenderness.  Lymphadenopathy:    She has no cervical adenopathy.  Neurological: She is alert and oriented to person, place, and time.  Skin: Skin is warm and dry.  Psychiatric: She has a normal mood and affect.          Assessment & Plan:  Lithium toxicity:  Suspect due to symptoms and the occurrence being after a recent increase in dose. Hold Lithium. Labs today: Lithium levels, CBC, CMP. Vitals  stable and do not indicate severe dehydration. Push oral fluids and food. Continue Zofran as prescribed by PCP. Continue to get into contact with psychiatry.  Follow up with any changes in mental status or if symptoms become worse. Will call later with lab results.

## 2015-06-03 ENCOUNTER — Telehealth: Payer: Self-pay | Admitting: Primary Care

## 2015-06-03 LAB — LITHIUM LEVEL: Lithium Lvl: 1 mEq/L (ref 0.80–1.40)

## 2015-06-03 NOTE — Telephone Encounter (Signed)
I left a message for the patient to return my call.

## 2015-06-03 NOTE — Telephone Encounter (Signed)
Will you please notify Leslie Wong (or her daugther) that her Lithium levels were within normal range yesterday which is most likely due to to cessation of the medication. Has she spoken with her psychiatrist regarding the episode? If not then she needs to restart her prior dose and follow up with her psychiatrist as soon as possible. Thanks.

## 2015-06-03 NOTE — Telephone Encounter (Signed)
Patient called back.Notified patient of Kate's comments. Patient verbalized understanding.

## 2015-06-16 ENCOUNTER — Telehealth: Payer: Self-pay | Admitting: Family Medicine

## 2015-06-16 NOTE — Telephone Encounter (Signed)
Referral canceled. Thanks!

## 2015-06-16 NOTE — Telephone Encounter (Signed)
Yes.  Please do so or let me know if I need to do anything.  Thanks.

## 2015-06-16 NOTE — Telephone Encounter (Signed)
Spoke to [place] at [facility] today to check on status of referral sent to them on 04/20/15. They have reached out to the patient several times and also mailed her a letter on 05/20/15 asking her to call and set up her consult appt. Pt has failed to contact them. Is it okay to cancel this referral at this time?

## 2015-06-17 ENCOUNTER — Telehealth: Payer: Self-pay

## 2015-06-17 ENCOUNTER — Other Ambulatory Visit: Payer: Self-pay | Admitting: Family Medicine

## 2015-06-17 NOTE — Telephone Encounter (Signed)
Left message on voice mail  to call back

## 2015-06-17 NOTE — Telephone Encounter (Signed)
What has she heard from her psychiatry clinic?  What is being done about her lithium currently?

## 2015-06-17 NOTE — Telephone Encounter (Signed)
Pt left v/m; pt states has lithium toxic levels with side effects of muscle weakness, dizziness,loss of appetite, has lost 15 lbs since March. Pt wants to know what to do to increase appetite and get rid of muscle weakness and dizziness. Pt seen 06/02/2015 with similar symptoms. Does pt need f/u visit?

## 2015-06-18 NOTE — Telephone Encounter (Signed)
Patient states she has an appointment with psychiatry at [facility] on Monday at 10:00 am.

## 2015-06-18 NOTE — Telephone Encounter (Signed)
Sent. Thanks.   

## 2015-06-18 NOTE — Telephone Encounter (Signed)
Electronic refill request. Last Filled:    30 tablet 3 RF on 12/11/2014  Please advise.

## 2015-06-18 NOTE — Telephone Encounter (Signed)
Left detailed message on voicemail to call back.  

## 2015-06-22 DIAGNOSIS — F3170 Bipolar disorder, currently in remission, most recent episode unspecified: Secondary | ICD-10-CM | POA: Diagnosis not present

## 2015-06-24 NOTE — Telephone Encounter (Signed)
Thanks

## 2015-06-24 NOTE — Telephone Encounter (Signed)
Pt called and wanted to know about a new pain clinic referral.  She does not want to go to Dr. Cleda Mccreedy because she states she did not like his treatment plan.  She understands that pain clinic referrals will take months to reschedule to another office and a second office may not accept her as a patient.  We also discussed the referral to [facility].  She says she never received a letter or phone call from their office.  I gave her the [place] phone number.  She is going to call [facility] and also call Dr. Silvana Newness office to reschedule to see him. No further follow up is needed at this time.

## 2015-06-25 DIAGNOSIS — L821 Other seborrheic keratosis: Secondary | ICD-10-CM | POA: Diagnosis not present

## 2015-06-25 DIAGNOSIS — B078 Other viral warts: Secondary | ICD-10-CM | POA: Diagnosis not present

## 2015-06-25 DIAGNOSIS — L853 Xerosis cutis: Secondary | ICD-10-CM | POA: Diagnosis not present

## 2015-07-01 ENCOUNTER — Telehealth: Payer: Self-pay | Admitting: Family Medicine

## 2015-07-01 NOTE — Telephone Encounter (Signed)
Pt called back. [facility] called her back and set up appt for 07/30/15 @ 11am with Dr. Rosine Door.  [facility] staff does have her on a cancellation list but asked her to ask you if you could refill the percocet to hold her over.  [facility] in [place]

## 2015-07-01 NOTE — Telephone Encounter (Signed)
Patient called back and wanted to make sure if the percocet is called in that it not have tylenol in it.

## 2015-07-01 NOTE — Telephone Encounter (Signed)
Pt called and left vm on Marion's machine re: pain referral. I called pt back for further information bc in last phone note, Vaughan Basta T spoke with pt on 6/29 and confirmed she was going to call [facility] and call Dr. Silvana Newness office to schedule appt. I mentioned this to the patient and she had no recollection that she was supposed to call [facility] nor had not had any missed calls from them nor receive letter that was mailed to her on 04/20/15. She is tired of hurting, she is miserable. She is hurting from head to toe, she is having cramps and legs feel like she is going to collapse. I suggested to the patient that since she did not want to go back to Dr. Cleda Mccreedy office (which she only saw him once in 2015) that she needs to start with [facility] since they have accepted her as a patient. I gave her the number to [facility] and she called and left them a voice mail. She called me back and I suggested she call them again in the morning and pt agreed and understood.  Pt also mentioned in the conversation about going to [facility] in [place]. I told her we would need to wait and see if Dr. Damita Dunnings is agreeable to that.   In the meantime, patient wants me to ask Dr. Damita Dunnings if she can get the percocet 5? She just doesn't know what to do about the pain and is hurting so bad.  Best # to reach pt at is [phone]

## 2015-07-02 NOTE — Telephone Encounter (Signed)
Patient advised.

## 2015-07-02 NOTE — Telephone Encounter (Signed)
I didn't fill anything at this point.  I'll await the neurosurgery appointment.   1.  We can't call oxycodone/percocet in.  2.  She had gotten tramadol from [facility] prev, so we shouldn't have pain meds coming from more than 1 clinic.  3.  I would need notice from [place] clinic- not from the patient- asking [name] to fill oxycodone before I can even consider rx'ing anything at this point.

## 2015-07-03 NOTE — Telephone Encounter (Signed)
Left detailed message on voicemail.  

## 2015-07-03 NOTE — Telephone Encounter (Signed)
Pt left v/m requesting percocet immediate release; pt contacted [facility] will not give pain med since have not seen pt. Pt request cb.

## 2015-07-03 NOTE — Telephone Encounter (Signed)
No

## 2015-07-15 ENCOUNTER — Encounter: Payer: Self-pay | Admitting: Internal Medicine

## 2015-07-15 ENCOUNTER — Telehealth: Payer: Self-pay | Admitting: Family Medicine

## 2015-07-15 ENCOUNTER — Ambulatory Visit (INDEPENDENT_AMBULATORY_CARE_PROVIDER_SITE_OTHER): Payer: Medicare Other | Admitting: Internal Medicine

## 2015-07-15 VITALS — BP 110/86 | HR 99 | Temp 98.2°F | Wt 111.0 lb

## 2015-07-15 DIAGNOSIS — R52 Pain, unspecified: Secondary | ICD-10-CM

## 2015-07-15 NOTE — Progress Notes (Signed)
Subjective:    Patient ID: Leslie Wong, female    DOB: November 22, 1956, 59 y.o.   MRN: [id number]  HPI  Pt presents to the clinic because she wants to discuss her pain. She reports that she is in "pain all over her body". She has chronic back pain but feels like her pain is more intense. The pain feels like someone is "braiding her muscles". The pain feels like electric shocks and stabbing sensations. She has noticed numbness and tingling in her hands but not in her feet. She also feels very weak. She does feel like her pain has been worse since she stopped the Lithium. She was referred to a pain clinic in the past. She saw Dr. Sundra Aland. She does not want to go back to him because all he will offer her is back injections. She would prefer to take Oxycodone like she was on before. She is unable to take NSAIDs due to gastris. She reports that Dr. Damita Dunnings has referred her to a neurosurgeon and she does not want to go to a neurosurgeon. She does not want surgery or injections, she prefers to take pain medication. She is also "not very happy" that Dr. Josefine Class assistant called her and told her she should not get pain medication from multiple providers. She did get Tramadol from another doctor but this is because she broke her left wrist. She is not "doctor shopping" and she does not understand why Dr. Damita Dunnings will not give her anything to treat her pain.  Review of Systems      Past Medical History  Diagnosis Date  . Depression     BAD  . Hypothyroidism   . Back pain   . Gastroparesis   . Insomnia   . GERD (gastroesophageal reflux disease)   . Migraines   . Urge incontinence   . Recurrent cold sores   . Shoulder bursitis     Current Outpatient Prescriptions  Medication Sig Dispense Refill  . buPROPion (WELLBUTRIN XL) 300 MG 24 hr tablet Take 300 mg by mouth daily.      . diphenhydrAMINE (BENADRYL) 25 MG tablet Take 2 by mouth prior to Nucynta     . levothyroxine (SYNTHROID,  LEVOTHROID) 88 MCG tablet TAKE 1 TABLET BY MOUTH EVERY DAY 90 tablet 0  . LORazepam (ATIVAN) 1 MG tablet Take 1 mg by mouth 3 (three) times daily as needed.     . Multiple Vitamin (MULTIVITAMIN) tablet Take 1 tablet by mouth daily.      Marland Kitchen omeprazole (PRILOSEC) 20 MG capsule TAKE ONE CAPSULE BY MOUTH EVERY MORNING 45 MINUTES BEFORE BREAKFAST 90 capsule 3  . omeprazole (PRILOSEC) 20 MG capsule TAKE ONE CAPSULE BY MOUTH EVERY MORNING 45 MINUTES BEFORE BREAKFAST 30 capsule 5  . ondansetron (ZOFRAN-ODT) 4 MG disintegrating tablet DISSOLVE 1 TABLET BY MOUTH AS NEEDED 20 tablet 0  . OVER THE COUNTER MEDICATION Black Kohash daily for menopausal symptoms.    . Oxybutynin Chloride (GELNIQUE) 10 % GEL Place onto the skin daily.      Marland Kitchen triamcinolone (KENALOG) 0.025 % cream Apply to affected areas two to three times per week for itching only.    . valACYclovir (VALTREX) 500 MG tablet TAKE 1 TABLET BY MOUTH TWICE DAILY AS NEEDED 30 tablet 3  . vitamin E 200 UNIT capsule Take 200 Units by mouth daily.      Marland Kitchen lithium 150 MG capsule Take 450 mg by mouth at bedtime.       No  current facility-administered medications for this visit.    Allergies  Allergen Reactions  . Aspirin     REACTION: UNSPECIFIED  . Baclofen     REACTION: Throat swells up  . Chantix [Varenicline Tartrate]     Oral ulcers  . Codeine     REACTION: UNSPECIFIED  . Erythromycin     REACTION: UNSPECIFIED  . Gabapentin     REACTION: throat swells up  . Metoclopramide Hcl     REACTION: states "messed me up"  . Nsaids Other (See Comments)    Stomach problems  . Nucynta Er [Tapentadol Hcl Er]     Intolerant, see note from 07/24/12.    Gean Birchwood [Tapentadol]     AMS  . Prednisone     GI intolance  . Pregabalin   . Seroquel [Quetiapine Fumerate] Other (See Comments)    Loss control, felt like on another planet  . Sulfa Antibiotics Nausea And Vomiting  . Topamax Other (See Comments)    Blisters in mouth and tongue  . Vicodin  [Hydrocodone-Acetaminophen]     Nausea, thrush and constipation    Family History  Problem Relation Age of Onset  . Hypertension Mother   . Arthritis Mother   . Diabetes Mother   . Cancer Father     lung  . Colon cancer Neg Hx   . Breast cancer Neg Hx     History   Social History  . Marital Status: Divorced    Spouse Name: N/A  . Number of Children: N/A  . Years of Education: N/A   Occupational History  . Not on file.   Social History Main Topics  . Smoking status: Former Smoker -- 0.50 packs/day for 35 years    Types: Cigarettes  . Smokeless tobacco: Former Systems developer  . Alcohol Use: No  . Drug Use: No  . Sexual Activity: No   Other Topics Concern  . Not on file   Social History Narrative     Constitutional: Pt reports fatigue. Denies fever, malaise, headache or abrupt weight changes.  Respiratory: Denies difficulty breathing, shortness of breath, cough or sputum production.   Cardiovascular: Denies chest pain, chest tightness, palpitations or swelling in the hands or feet.  Musculoskeletal: Pt reports muscle and joint pain.  Skin: Denies redness, rashes, lesions or ulcercations.  Neurological: Denies dizziness, difficulty with memory, difficulty with speech or problems with balance and coordination.   No other specific complaints in a complete review of systems (except as listed in HPI above).  Objective:   Physical Exam   BP 110/86 mmHg  Pulse 99  Temp(Src) 98.2 F (36.8 C) (Oral)  Wt 111 lb (50.349 kg)  SpO2 98% Wt Readings from Last 3 Encounters:  07/15/15 111 lb (50.349 kg)  06/02/15 114 lb (51.71 kg)  03/30/15 120 lb 8 oz (54.658 kg)    General: Appears her stated age,  in NAD. Cardiovascular: Normal rate and rhythm. S1,S2 noted.  No murmur, rubs or gallops noted.  Pulmonary/Chest: Normal effort and positive vesicular breath sounds. No respiratory distress. No wheezes, rales or ronchi noted.  Musculoskeletal: Exam difficult, everywhere I touch she  grimaces and groans in pain. No signs of joint swelling. She is limping, favoring her left side. Neurological: Alert and oriented. Sensation intact to upper and lower extremities. Psychiatric: She seems anxious today.  BMET    Component Value Date/Time   NA 139 06/02/2015 1043   NA 140 06/25/2014 1234   K 3.8 06/02/2015 1043  K 4.1 06/25/2014 1234   CL 106 06/02/2015 1043   CL 106 06/25/2014 1234   CO2 27 06/02/2015 1043   CO2 28 06/25/2014 1234   GLUCOSE 115* 06/02/2015 1043   GLUCOSE 91 06/25/2014 1234   BUN 5* 06/02/2015 1043   BUN 10 06/25/2014 1234   CREATININE 0.95 06/02/2015 1043   CREATININE 1.01 06/25/2014 1234   CALCIUM 11.4* 06/02/2015 1043   CALCIUM 10.1 06/25/2014 1234   CALCIUM 10.8* 06/05/2012 1256   GFRNONAA >60 06/25/2014 1234   GFRNONAA 62.05 03/25/2010 1537   GFRAA >60 06/25/2014 1234   GFRAA 98 06/05/2007 1127    Lipid Panel     Component Value Date/Time   CHOL 259* 02/24/2014 1026   TRIG 362.0* 02/24/2014 1026   HDL 34.80* 02/24/2014 1026   CHOLHDL 7 02/24/2014 1026   VLDL 72.4* 02/24/2014 1026   LDLCALC 152* 02/24/2014 1026    CBC    Component Value Date/Time   WBC 15.9* 06/02/2015 1043   WBC 12.9* 01/27/2013 0213   RBC 4.34 06/02/2015 1043   RBC 4.32 01/27/2013 0213   HGB 13.6 06/02/2015 1043   HGB 14.1 01/27/2013 0213   HCT 41.5 06/02/2015 1043   HCT 42.5 01/27/2013 0213   PLT 305.0 06/02/2015 1043   PLT 347 01/27/2013 0213   MCV 95.4 06/02/2015 1043   MCV 98 01/27/2013 0213   MCH 32.6 01/27/2013 0213   MCHC 32.8 06/02/2015 1043   MCHC 33.2 01/27/2013 0213   RDW 14.3 06/02/2015 1043   RDW 13.8 01/27/2013 0213   LYMPHSABS 2.9 02/18/2015 1606   LYMPHSABS 3.8* 01/27/2013 0213   MONOABS 1.0 02/18/2015 1606   MONOABS 0.9 01/27/2013 0213   EOSABS 0.3 02/18/2015 1606   EOSABS 0.4 01/27/2013 0213   BASOSABS 0.1 02/18/2015 1606   BASOSABS 0.1 01/27/2013 0213    Hgb A1C No results found for: HGBA1C      Assessment & Plan:    Pain:  I had a frank discussion with her that I would not prescribe narcotics for her She then asked me for a muscle relaxer, which I also declined I talked with her about why Dr. Damita Dunnings wanted her to see a neurosurgeon, he wants to see if her pain can be improved by other methods that just covering up her symptoms with pain medication She wants to know if she can cancel her referral to neurosurgery and be referred to a pain clinic instead- I advised her that I would discuss with Dr. Damita Dunnings and get back in touch with her.  Follow up with PCP as needed

## 2015-07-15 NOTE — Telephone Encounter (Signed)
I've had detailed discussions with her prev.  She has a complicated situation that I cannot be expected to manage (in an open-ended fashion) with opiates. I haven't written an oxycodone rx for her (per EMR records) in over 1 year.   She needs specialty eval and treatment.  Prev referred to neurosurgery re: low back pain per patient request (03/30/15). She has declined f/u with pain clinic.  I can't force her to go for the eval.  I have encouraged her to go for eval prev.  If she is disappointed in my care, she can see primary care with another doc.

## 2015-07-15 NOTE — Patient Instructions (Signed)
Back Pain, Adult Low back pain is very common. About 1 in 5 people have back pain.The cause of low back pain is rarely dangerous. The pain often gets better over time.About half of people with a sudden onset of back pain feel better in just 2 weeks. About 8 in 10 people feel better by 6 weeks.  CAUSES Some common causes of back pain include:  Strain of the muscles or ligaments supporting the spine.  Wear and tear (degeneration) of the spinal discs.  Arthritis.  Direct injury to the back. DIAGNOSIS Most of the time, the direct cause of low back pain is not known.However, back pain can be treated effectively even when the exact cause of the pain is unknown.Answering your caregiver's questions about your overall health and symptoms is one of the most accurate ways to make sure the cause of your pain is not dangerous. If your caregiver needs more information, he or she may order lab work or imaging tests (X-rays or MRIs).However, even if imaging tests show changes in your back, this usually does not require surgery. HOME CARE INSTRUCTIONS For many people, back pain returns.Since low back pain is rarely dangerous, it is often a condition that people can learn to manageon their own.   Remain active. It is stressful on the back to sit or stand in one place. Do not sit, drive, or stand in one place for more than 30 minutes at a time. Take short walks on level surfaces as soon as pain allows.Try to increase the length of time you walk each day.  Do not stay in bed.Resting more than 1 or 2 days can delay your recovery.  Do not avoid exercise or work.Your body is made to move.It is not dangerous to be active, even though your back may hurt.Your back will likely heal faster if you return to being active before your pain is gone.  Pay attention to your body when you bend and lift. Many people have less discomfortwhen lifting if they bend their knees, keep the load close to their bodies,and  avoid twisting. Often, the most comfortable positions are those that put less stress on your recovering back.  Find a comfortable position to sleep. Use a firm mattress and lie on your side with your knees slightly bent. If you lie on your back, put a pillow under your knees.  Only take over-the-counter or prescription medicines as directed by your caregiver. Over-the-counter medicines to reduce pain and inflammation are often the most helpful.Your caregiver may prescribe muscle relaxant drugs.These medicines help dull your pain so you can more quickly return to your normal activities and healthy exercise.  Put ice on the injured area.  Put ice in a plastic bag.  Place a towel between your skin and the bag.  Leave the ice on for 15-20 minutes, 03-04 times a day for the first 2 to 3 days. After that, ice and heat may be alternated to reduce pain and spasms.  Ask your caregiver about trying back exercises and gentle massage. This may be of some benefit.  Avoid feeling anxious or stressed.Stress increases muscle tension and can worsen back pain.It is important to recognize when you are anxious or stressed and learn ways to manage it.Exercise is a great option. SEEK MEDICAL CARE IF:  You have pain that is not relieved with rest or medicine.  You have pain that does not improve in 1 week.  You have new symptoms.  You are generally not feeling well. SEEK   IMMEDIATE MEDICAL CARE IF:   You have pain that radiates from your back into your legs.  You develop new bowel or bladder control problems.  You have unusual weakness or numbness in your arms or legs.  You develop nausea or vomiting.  You develop abdominal pain.  You feel faint. Document Released: 12/12/2005 Document Revised: 06/12/2012 Document Reviewed: 04/15/2014 Exit[employer] Patient Information 2015 [employer]. This information is not intended to replace advice given to you by your health care provider. Make sure you  discuss any questions you have with your health care provider.  

## 2015-07-15 NOTE — Progress Notes (Signed)
Pre visit review using our clinic review tool, if applicable. No additional management support is needed unless otherwise documented below in the visit note. 

## 2015-07-16 ENCOUNTER — Other Ambulatory Visit: Payer: Self-pay

## 2015-07-16 ENCOUNTER — Emergency Department
Admission: EM | Admit: 2015-07-16 | Discharge: 2015-07-16 | Disposition: A | Discharge status: Home / Self Care | Payer: Medicare Other | Attending: Emergency Medicine | Admitting: Emergency Medicine

## 2015-07-16 ENCOUNTER — Encounter: Payer: Self-pay | Admitting: Emergency Medicine

## 2015-07-16 ENCOUNTER — Emergency Department: Payer: Medicare Other

## 2015-07-16 DIAGNOSIS — Z87891 Personal history of nicotine dependence: Secondary | ICD-10-CM | POA: Diagnosis not present

## 2015-07-16 DIAGNOSIS — R911 Solitary pulmonary nodule: Secondary | ICD-10-CM | POA: Insufficient documentation

## 2015-07-16 DIAGNOSIS — R079 Chest pain, unspecified: Secondary | ICD-10-CM | POA: Diagnosis not present

## 2015-07-16 DIAGNOSIS — R918 Other nonspecific abnormal finding of lung field: Secondary | ICD-10-CM | POA: Diagnosis not present

## 2015-07-16 DIAGNOSIS — R0789 Other chest pain: Secondary | ICD-10-CM | POA: Diagnosis not present

## 2015-07-16 DIAGNOSIS — N281 Cyst of kidney, acquired: Secondary | ICD-10-CM | POA: Diagnosis not present

## 2015-07-16 DIAGNOSIS — R1011 Right upper quadrant pain: Secondary | ICD-10-CM | POA: Diagnosis not present

## 2015-07-16 DIAGNOSIS — M549 Dorsalgia, unspecified: Secondary | ICD-10-CM

## 2015-07-16 DIAGNOSIS — Z79899 Other long term (current) drug therapy: Secondary | ICD-10-CM | POA: Insufficient documentation

## 2015-07-16 LAB — CBC WITH DIFFERENTIAL/PLATELET
Basophils Absolute: 0.1 10*3/uL (ref 0–0.1)
Basophils Relative: 1 %
Eosinophils Absolute: 0.2 10*3/uL (ref 0–0.7)
Eosinophils Relative: 2 %
HEMATOCRIT: 42.5 % (ref 35.0–47.0)
Hemoglobin: 14.2 g/dL (ref 12.0–16.0)
LYMPHS PCT: 28 %
Lymphs Abs: 3.4 10*3/uL (ref 1.0–3.6)
MCH: 31.5 pg (ref 26.0–34.0)
MCHC: 33.5 g/dL (ref 32.0–36.0)
MCV: 94 fL (ref 80.0–100.0)
MONOS PCT: 7 %
Monocytes Absolute: 0.8 10*3/uL (ref 0.2–0.9)
NEUTROS ABS: 7.6 10*3/uL — AB (ref 1.4–6.5)
Neutrophils Relative %: 62 %
Platelets: 213 10*3/uL (ref 150–440)
RBC: 4.52 MIL/uL (ref 3.80–5.20)
RDW: 13.4 % (ref 11.5–14.5)
WBC: 12.1 10*3/uL — ABNORMAL HIGH (ref 3.6–11.0)

## 2015-07-16 LAB — COMPREHENSIVE METABOLIC PANEL
ALT: 14 U/L (ref 14–54)
AST: 20 U/L (ref 15–41)
Albumin: 4 g/dL (ref 3.5–5.0)
Alkaline Phosphatase: 92 U/L (ref 38–126)
Anion gap: 7 (ref 5–15)
BILIRUBIN TOTAL: 0.4 mg/dL (ref 0.3–1.2)
BUN: 8 mg/dL (ref 6–20)
CHLORIDE: 109 mmol/L (ref 101–111)
CO2: 26 mmol/L (ref 22–32)
Calcium: 10.4 mg/dL — ABNORMAL HIGH (ref 8.9–10.3)
Creatinine, Ser: 0.83 mg/dL (ref 0.44–1.00)
GFR calc Af Amer: >60 mL/min (ref >60)
GLUCOSE: 101 mg/dL — AB (ref 65–99)
Potassium: 3.7 mmol/L (ref 3.5–5.1)
SODIUM: 142 mmol/L (ref 135–145)
Total Protein: 7 g/dL (ref 6.5–8.1)

## 2015-07-16 LAB — TROPONIN I

## 2015-07-16 LAB — LITHIUM LEVEL: Lithium Lvl: <0.06 mmol/L — ABNORMAL LOW (ref 0.60–1.20)

## 2015-07-16 LAB — LIPASE, BLOOD: LIPASE: 27 U/L (ref 22–51)

## 2015-07-16 MED ORDER — HYDROMORPHONE HCL 1 MG/ML IJ SOLN
INTRAMUSCULAR | Status: AC
  Administered 2015-07-16: 1 mg via INTRAVENOUS
  Filled 2015-07-16: qty 1

## 2015-07-16 MED ORDER — HYDROMORPHONE HCL 1 MG/ML IJ SOLN
1.0000 mg | Freq: Once | INTRAMUSCULAR | Status: AC
  Administered 2015-07-16: 1 mg via INTRAVENOUS
  Filled 2015-07-16: qty 1

## 2015-07-16 MED ORDER — ONDANSETRON HCL 4 MG/2ML IJ SOLN
4.0000 mg | Freq: Once | INTRAMUSCULAR | Status: DC
  Filled 2015-07-16: qty 2

## 2015-07-16 MED ORDER — IOHEXOL 350 MG/ML SOLN
100.0000 mL | Freq: Once | INTRAVENOUS | Status: AC | PRN
  Administered 2015-07-16: 100 mL via INTRAVENOUS

## 2015-07-16 MED ORDER — LORAZEPAM 2 MG/ML IJ SOLN
1.0000 mg | Freq: Once | INTRAMUSCULAR | Status: AC
  Administered 2015-07-16: 1 mg via INTRAVENOUS

## 2015-07-16 MED ORDER — LORAZEPAM 2 MG/ML IJ SOLN
INTRAMUSCULAR | Status: AC
  Administered 2015-07-16: 1 mg via INTRAVENOUS
  Filled 2015-07-16: qty 1

## 2015-07-16 MED ORDER — ONDANSETRON 4 MG PO TBDP
4.0000 mg | ORAL_TABLET | Freq: Once | ORAL | Status: AC
  Administered 2015-07-16: 4 mg via ORAL
  Filled 2015-07-16: qty 1

## 2015-07-16 MED ORDER — SODIUM CHLORIDE 0.9 % IV BOLUS (SEPSIS)
1000.0000 mL | Freq: Once | INTRAVENOUS | Status: AC
  Administered 2015-07-16: 1000 mL via INTRAVENOUS

## 2015-07-16 MED ORDER — HYDROMORPHONE HCL 1 MG/ML IJ SOLN
1.0000 mg | Freq: Once | INTRAMUSCULAR | Status: AC
  Administered 2015-07-16: 1 mg via INTRAVENOUS

## 2015-07-16 NOTE — ED Notes (Signed)
Pt arrived via EMS from home with right side chest pain that started yesterday. Pt states "feels like a knife shooting through me to my back"; pt states pain is 9/10. Pt is alert and oriented x3.

## 2015-07-16 NOTE — ED Provider Notes (Signed)
CSN: [id number]     Arrival date & time 07/16/15  1423 History   First MD Initiated Contact with Patient 07/16/15 1501     Chief Complaint  Patient presents with  . Chest Pain     (Consider location/radiation/quality/duration/timing/severity/associated sxs/prior Treatment) The history is provided by the patient.  Leslie Wong is a 59 y.o. female hx of chronic back pain, gastroparesis here with ab pain, chest pain. Patient states that she has been having sharp right sided chest pain with radiation to the back since yesterday. Also has RUQ pain as well. Worse with movement, felt like a knife to the back. Of note, patient has chronic back pain and was taken off of her percocet by PMD and sent to pain doctor, who just wants to give her injections. Denies fever or chills. Denies urinary symptoms. Her lithium is also increased by psychiatrist several months and she feels more tired and weak recently.    Past Medical History  Diagnosis Date  . Depression     BAD  . Hypothyroidism   . Back pain   . Gastroparesis   . Insomnia   . GERD (gastroesophageal reflux disease)   . Migraines   . Urge incontinence   . Recurrent cold sores   . Shoulder bursitis    Past Surgical History  Procedure Laterality Date  . Appendectomy    . Tonsillectomy and adenoidectomy    . Laparoscopy      for endometriosis  . Ovarian cyst removal  1990    unilateral  . Abdominal hysterectomy  1999    total  . Esophagogastroduodenoscopy  01/2006    negative except small hiatal hernia  . Esophagogastroduodenoscopy  02/24/2015    See report   Family History  Problem Relation Age of Onset  . Hypertension Mother   . Arthritis Mother   . Diabetes Mother   . Cancer Father     lung  . Colon cancer Neg Hx   . Breast cancer Neg Hx    History  Substance Use Topics  . Smoking status: Former Smoker -- 0.50 packs/day for 35 years    Types: Cigarettes  . Smokeless tobacco: Former Systems developer  . Alcohol Use: No    OB History    No data available     Review of Systems  Cardiovascular: Positive for chest pain.  Musculoskeletal: Positive for back pain.  All other systems reviewed and are negative.     Allergies  Aspirin; Baclofen; Chantix; Codeine; Erythromycin; Gabapentin; Metoclopramide hcl; Nsaids; Nucynta; Nucynta er; Prednisone; Pregabalin; Seroquel; Sulfa antibiotics; Topamax; and Vicodin  Home Medications   Prior to Admission medications   Medication Sig Start Date End Date Taking? Authorizing Provider  buPROPion (WELLBUTRIN XL) 300 MG 24 hr tablet Take 300 mg by mouth daily.      Historical Provider, MD  diphenhydrAMINE (BENADRYL) 25 MG tablet Take 2 by mouth prior to [place] Provider, MD  levothyroxine (SYNTHROID, LEVOTHROID) 88 MCG tablet TAKE 1 TABLET BY MOUTH EVERY DAY 06/01/15   Tonia Ghent, MD  lithium 150 MG capsule Take 450 mg by mouth at bedtime.      Historical Provider, MD  LORazepam (ATIVAN) 1 MG tablet Take 1 mg by mouth 3 (three) times daily as needed.     Historical Provider, MD  Multiple Vitamin (MULTIVITAMIN) tablet Take 1 tablet by mouth daily.      Historical Provider, MD  omeprazole (PRILOSEC) 20 MG capsule TAKE ONE CAPSULE  BY MOUTH EVERY MORNING 45 MINUTES BEFORE BREAKFAST 04/29/14   Tonia Ghent, MD  omeprazole (PRILOSEC) 20 MG capsule TAKE ONE CAPSULE BY MOUTH EVERY MORNING 45 MINUTES BEFORE BREAKFAST 06/01/15   Tonia Ghent, MD  ondansetron (ZOFRAN-ODT) 4 MG disintegrating tablet DISSOLVE 1 TABLET BY MOUTH AS NEEDED 04/20/15   Tonia Ghent, MD  OVER THE COUNTER MEDICATION [place] daily for menopausal symptoms.    Historical Provider, MD  Oxybutynin Chloride (GELNIQUE) 10 % GEL Place onto the skin daily.      Historical Provider, MD  triamcinolone (KENALOG) 0.025 % cream Apply to affected areas two to three times per week for itching only.    Historical Provider, MD  valACYclovir (VALTREX) 500 MG tablet TAKE 1 TABLET BY MOUTH TWICE DAILY  AS NEEDED 06/18/15   Tonia Ghent, MD  vitamin E 200 UNIT capsule Take 200 Units by mouth daily.      Historical Provider, MD   BP 129/101 mmHg  Pulse 78  Temp(Src) 98.2 F (36.8 C) (Oral)  Ht 5\' 6"  (1.676 m)  Wt 111 lb (50.349 kg)  BMI 17.92 kg/m2  SpO2 97% Physical Exam  Constitutional: She is oriented to person, place, and time.  Uncomfortable   HENT:  Head: Normocephalic.  Mouth/Throat: Oropharynx is clear and moist.  Eyes: Conjunctivae are normal. Pupils are equal, round, and reactive to light.  Neck: Normal range of motion. Neck supple.  Cardiovascular: Normal rate, regular rhythm and normal heart sounds.   Pulmonary/Chest: Effort normal and breath sounds normal. No respiratory distress. She has no wheezes. She has no rales.  + R sided tenderness   Abdominal: Soft. Bowel sounds are normal.  Mild RUQ tenderness   Musculoskeletal: Normal range of motion. She exhibits no edema or tenderness.  Mild R paralumbar tenderness, no midline tenderness   Neurological: She is alert and oriented to person, place, and time. No cranial nerve deficit. Coordination normal.  Skin: Skin is warm and dry.  Psychiatric: She has a normal mood and affect. Her behavior is normal. Judgment and thought content normal.  Nursing note and vitals reviewed.   ED Course  Procedures (including critical care time) Labs Review Labs Reviewed  CBC WITH DIFFERENTIAL/PLATELET - Abnormal; Notable for the following:    WBC 12.1 (*)    Neutro Abs 7.6 (*)    All other components within normal limits  COMPREHENSIVE METABOLIC PANEL - Abnormal; Notable for the following:    Glucose, Bld 101 (*)    Calcium 10.4 (*)    All other components within normal limits  LITHIUM LEVEL - Abnormal; Notable for the following:    Lithium Lvl <0.06 (*)    All other components within normal limits  TROPONIN I  LIPASE, BLOOD  URINALYSIS COMPLETEWITH MICROSCOPIC [facility] ONLY)    Imaging Review Dg Chest 2 View  07/16/2015    CLINICAL DATA:  Right chest pain.  Initial evaluation.  EXAM: CHEST  2 VIEW  COMPARISON:  CT 06/25/2014.  FINDINGS: Mediastinum and hilar structures normal the lungs are clear. Cardiomegaly with normal pulmonary vascularity. No pleural effusion or pneumothorax. No acute bony abnormality.  IMPRESSION: 1. Mild cardiomegaly with normal pulmonary vascularity. 2. No acute pulmonary disease.   Electronically Signed   By: Marcello Moores  Register   On: 07/16/2015 15:34   Ct Angio Chest Aorta W/cm &/or Wo/cm  07/16/2015   CLINICAL DATA:  Chest pain, right upper quadrant abdominal pain.  EXAM: CT ANGIOGRAPHY CHEST, ABDOMEN AND PELVIS  TECHNIQUE: Multidetector CT imaging through the chest, abdomen and pelvis was performed using the standard protocol during bolus administration of intravenous contrast. Multiplanar reconstructed images and MIPs were obtained and reviewed to evaluate the vascular anatomy.  CONTRAST:  187mL OMNIPAQUE IOHEXOL 350 MG/ML SOLN  COMPARISON:  CT scan of the abdomen of February 18, 2015; CT scan of the chest of June 25, 2014.  FINDINGS: CTA CHEST FINDINGS  No pneumothorax or pleural effusion is noted. No acute pulmonary disease is noted. Stable 4 mm nodule is noted laterally in left upper lobe best seen on image number 17 of series 4. Stable 4 mm nodule is noted in right lung apex. There is no evidence of thoracic aortic dissection or aneurysm. There is no definite evidence of pulmonary embolus. Great vessels are widely patent without significant stenosis. No mediastinal mass or adenopathy is noted. No significant osseous abnormality is noted in the chest.  Review of the MIP images confirms the above findings.  CTA ABDOMEN AND PELVIS FINDINGS  There is no evidence of abdominal aortic aneurysm or dissection. Atherosclerosis of abdominal aorta is noted. The mesenteric and renal arteries are widely patent without significant stenosis. Fatty infiltration of the liver is noted. No significant abnormality is noted  in the spleen or pancreas. Adrenal glands appear normal. No hydronephrosis or renal obstruction is noted. Left renal cysts are noted. There is no evidence of bowel obstruction. No abnormal fluid collection is noted. Urinary bladder appears normal. Status post hysterectomy. No significant adenopathy is noted.  Review of the MIP images confirms the above findings.  IMPRESSION: There is no evidence of thoracic or abdominal aortic aneurysm or dissection.  Stable bilateral 4 mm pulmonary nodules are noted compared to prior exam. They can be considered benign with no further follow-up required.  Probable fatty infiltration of the liver. No other significant abnormality is noted in the chest, abdomen or pelvis.   Electronically Signed   By: Marijo Conception, M.D.   On: 07/16/2015 16:59   Ct Cta Abd/pel W/cm &/or W/o Cm  07/16/2015   CLINICAL DATA:  Chest pain, right upper quadrant abdominal pain.  EXAM: CT ANGIOGRAPHY CHEST, ABDOMEN AND PELVIS  TECHNIQUE: Multidetector CT imaging through the chest, abdomen and pelvis was performed using the standard protocol during bolus administration of intravenous contrast. Multiplanar reconstructed images and MIPs were obtained and reviewed to evaluate the vascular anatomy.  CONTRAST:  155mL OMNIPAQUE IOHEXOL 350 MG/ML SOLN  COMPARISON:  CT scan of the abdomen of February 18, 2015; CT scan of the chest of June 25, 2014.  FINDINGS: CTA CHEST FINDINGS  No pneumothorax or pleural effusion is noted. No acute pulmonary disease is noted. Stable 4 mm nodule is noted laterally in left upper lobe best seen on image number 17 of series 4. Stable 4 mm nodule is noted in right lung apex. There is no evidence of thoracic aortic dissection or aneurysm. There is no definite evidence of pulmonary embolus. Great vessels are widely patent without significant stenosis. No mediastinal mass or adenopathy is noted. No significant osseous abnormality is noted in the chest.  Review of the MIP images confirms  the above findings.  CTA ABDOMEN AND PELVIS FINDINGS  There is no evidence of abdominal aortic aneurysm or dissection. Atherosclerosis of abdominal aorta is noted. The mesenteric and renal arteries are widely patent without significant stenosis. Fatty infiltration of the liver is noted. No significant abnormality is noted in the spleen or pancreas. Adrenal glands appear normal. No hydronephrosis  or renal obstruction is noted. Left renal cysts are noted. There is no evidence of bowel obstruction. No abnormal fluid collection is noted. Urinary bladder appears normal. Status post hysterectomy. No significant adenopathy is noted.  Review of the MIP images confirms the above findings.  IMPRESSION: There is no evidence of thoracic or abdominal aortic aneurysm or dissection.  Stable bilateral 4 mm pulmonary nodules are noted compared to prior exam. They can be considered benign with no further follow-up required.  Probable fatty infiltration of the liver. No other significant abnormality is noted in the chest, abdomen or pelvis.   Electronically Signed   By: Marijo Conception, M.D.   On: 07/16/2015 16:59     EKG Interpretation None      ED ECG REPORT   Date: 07/16/2015  EKG Time: 6:03 PM  Rate: 74  Rhythm: normal sinus rhythm,  normal EKG, normal sinus rhythm  Axis: normal  Intervals:none  ST&T Change: none  Narrative Interpretation: normal EKG              MDM   Final diagnoses:  None   Leslie Wong is a 59 y.o. female here with chest pain, RUQ pain with back pain. Consider chole vs dissection vs MSK vs lithium toxicity vs chronic back pain. Will get labs, CXR, trop. Will do dissection study and give pain meds.   6:03 PM CT showed no dissection. Does have a pulmonary nodule that can be followed up. Labs unremarkable. No cholecystitis on CT. Likely worsening chronic pain. Has pain specialist who she is not happy with. PMD doesn't want to continue to prescribe pain meds. I told her  that I will not prescribe pain meds as she has chronic pain. She can be referred to different pain specialist by PCP.    Wandra Arthurs, MD 07/16/15 [phone]

## 2015-07-16 NOTE — Discharge Instructions (Signed)
Talk to your doctor about referring to a different pain specialist.   You have a lung nodule that can be followed up.   Return to ER if you have severe chest pain, back pain, vomiting.

## 2015-07-23 DIAGNOSIS — M1812 Unilateral primary osteoarthritis of first carpometacarpal joint, left hand: Secondary | ICD-10-CM | POA: Diagnosis not present

## 2015-07-30 DIAGNOSIS — M5412 Radiculopathy, cervical region: Secondary | ICD-10-CM | POA: Diagnosis not present

## 2015-08-04 DIAGNOSIS — F3130 Bipolar disorder, current episode depressed, mild or moderate severity, unspecified: Secondary | ICD-10-CM | POA: Diagnosis not present

## 2015-08-26 DIAGNOSIS — H2513 Age-related nuclear cataract, bilateral: Secondary | ICD-10-CM | POA: Diagnosis not present

## 2015-08-27 ENCOUNTER — Other Ambulatory Visit: Payer: Self-pay | Admitting: Family Medicine

## 2015-08-28 ENCOUNTER — Telehealth: Payer: Self-pay | Admitting: Family Medicine

## 2015-08-28 DIAGNOSIS — G8929 Other chronic pain: Secondary | ICD-10-CM

## 2015-08-28 NOTE — Telephone Encounter (Signed)
Pt called stating she was referred to dr lad @ [facility] pt stated she didn't need to see him she needs a referral to [facility].

## 2015-08-28 NOTE — Telephone Encounter (Signed)
Ordered. Thanks

## 2015-09-11 DIAGNOSIS — H40053 Ocular hypertension, bilateral: Secondary | ICD-10-CM | POA: Diagnosis not present

## 2015-09-14 ENCOUNTER — Other Ambulatory Visit: Payer: Self-pay | Admitting: Family Medicine

## 2015-09-14 NOTE — Telephone Encounter (Signed)
Received refill request electronically Last refill 6/23/ # 30/3 Last office visit 07/15/15/acute Is it okay to refill medication?

## 2015-09-14 NOTE — Telephone Encounter (Signed)
Sent. Thanks.   

## 2015-09-22 DIAGNOSIS — F3170 Bipolar disorder, currently in remission, most recent episode unspecified: Secondary | ICD-10-CM | POA: Diagnosis not present

## 2015-09-22 DIAGNOSIS — G4700 Insomnia, unspecified: Secondary | ICD-10-CM | POA: Diagnosis not present

## 2015-09-23 ENCOUNTER — Telehealth: Payer: Self-pay | Admitting: Family Medicine

## 2015-09-23 DIAGNOSIS — M2550 Pain in unspecified joint: Secondary | ICD-10-CM

## 2015-09-23 MED ORDER — MAGIC MOUTHWASH: 5.0000 mL | Freq: Three times a day (TID) | ORAL | Status: DC | PRN

## 2015-09-23 NOTE — Telephone Encounter (Signed)
Pt needs a referral to go to Cristi Loron (ra doctor) she wants to go to the [place] location .  She also needs a script for magic mouthwash sent to pharmacy [facility] Shari Prows [phone] Pt states that it will cover it if it is the dry ingredients, if the pharmacist mixes it up, then insurance will not cover. cb number is [phone] thanks

## 2015-09-23 NOTE — Telephone Encounter (Signed)
Patient notified as instructed by telephone and verbalized understanding. Advised patient that the referral coordinator will be in touch with her to get this set up. 

## 2015-09-23 NOTE — Telephone Encounter (Addendum)
rx sent, referral placed.  She'll need to talk to pharmacy about the rx, if they mix it or not.  I don't know how to send it "dry", since it is mouthwash and it has liquid ingredients.   Needs to f/u if oral pain persists.

## 2015-10-05 ENCOUNTER — Other Ambulatory Visit: Payer: Self-pay | Admitting: Family Medicine

## 2015-10-09 DIAGNOSIS — Z872 Personal history of diseases of the skin and subcutaneous tissue: Secondary | ICD-10-CM | POA: Diagnosis not present

## 2015-10-09 DIAGNOSIS — Z789 Other specified health status: Secondary | ICD-10-CM | POA: Diagnosis not present

## 2015-10-09 DIAGNOSIS — B078 Other viral warts: Secondary | ICD-10-CM | POA: Diagnosis not present

## 2015-10-09 DIAGNOSIS — Q802 Lamellar ichthyosis: Secondary | ICD-10-CM | POA: Diagnosis not present

## 2015-10-09 DIAGNOSIS — L821 Other seborrheic keratosis: Secondary | ICD-10-CM | POA: Diagnosis not present

## 2015-10-11 ENCOUNTER — Other Ambulatory Visit: Payer: Self-pay | Admitting: Family Medicine

## 2015-10-13 ENCOUNTER — Encounter: Payer: Self-pay | Admitting: Cardiology

## 2015-10-23 DIAGNOSIS — Z79891 Long term (current) use of opiate analgesic: Secondary | ICD-10-CM | POA: Diagnosis not present

## 2015-10-23 DIAGNOSIS — G894 Chronic pain syndrome: Secondary | ICD-10-CM | POA: Diagnosis not present

## 2015-10-23 DIAGNOSIS — Z5181 Encounter for therapeutic drug level monitoring: Secondary | ICD-10-CM | POA: Diagnosis not present

## 2015-10-23 DIAGNOSIS — G5603 Carpal tunnel syndrome, bilateral upper limbs: Secondary | ICD-10-CM | POA: Diagnosis not present

## 2015-10-23 DIAGNOSIS — G43909 Migraine, unspecified, not intractable, without status migrainosus: Secondary | ICD-10-CM | POA: Diagnosis not present

## 2015-10-28 ENCOUNTER — Other Ambulatory Visit: Payer: Self-pay | Admitting: Family Medicine

## 2015-10-28 NOTE — Telephone Encounter (Signed)
Received refill request electronically  Last refill 04/20/15 #20 Last office visit 07/15/15/acute Is it okay to refill

## 2015-10-29 DIAGNOSIS — H029 Unspecified disorder of eyelid: Secondary | ICD-10-CM | POA: Diagnosis not present

## 2015-10-29 NOTE — Telephone Encounter (Signed)
Left message on voice mail  to call back

## 2015-10-29 NOTE — Telephone Encounter (Signed)
Please verify with patient.  What are her symptoms, is she vomiting, etc.  Thanks.

## 2015-10-29 NOTE — Telephone Encounter (Signed)
Noted. Thanks.  Sent.  

## 2015-10-29 NOTE — Telephone Encounter (Signed)
Patient states she tries to keep this medication on hand prn and has taken the last one.  She periodically needs it but not right at this time.  Patient did see the Pain MD and was put on Nortriptyline 25 mg qhs and Oxycodone 5mg  with no Tylenol prn pain.  Patient says she usually only takes an Oxycodone at night before bed.

## 2015-11-06 DIAGNOSIS — L570 Actinic keratosis: Secondary | ICD-10-CM | POA: Diagnosis not present

## 2015-11-06 DIAGNOSIS — Z1283 Encounter for screening for malignant neoplasm of skin: Secondary | ICD-10-CM | POA: Diagnosis not present

## 2015-11-06 DIAGNOSIS — Z872 Personal history of diseases of the skin and subcutaneous tissue: Secondary | ICD-10-CM | POA: Diagnosis not present

## 2015-11-06 DIAGNOSIS — L118 Other specified acantholytic disorders: Secondary | ICD-10-CM | POA: Diagnosis not present

## 2015-11-06 DIAGNOSIS — D485 Neoplasm of uncertain behavior of skin: Secondary | ICD-10-CM | POA: Diagnosis not present

## 2015-11-25 ENCOUNTER — Other Ambulatory Visit: Payer: Self-pay | Admitting: Family Medicine

## 2015-11-30 DIAGNOSIS — Z79899 Other long term (current) drug therapy: Secondary | ICD-10-CM | POA: Diagnosis not present

## 2015-11-30 DIAGNOSIS — F3176 Bipolar disorder, in full remission, most recent episode depressed: Secondary | ICD-10-CM | POA: Diagnosis not present

## 2015-11-30 DIAGNOSIS — E032 Hypothyroidism due to medicaments and other exogenous substances: Secondary | ICD-10-CM | POA: Diagnosis not present

## 2015-11-30 DIAGNOSIS — F4310 Post-traumatic stress disorder, unspecified: Secondary | ICD-10-CM | POA: Diagnosis not present

## 2015-11-30 DIAGNOSIS — G4700 Insomnia, unspecified: Secondary | ICD-10-CM | POA: Diagnosis not present

## 2015-12-14 ENCOUNTER — Encounter: Payer: Self-pay | Admitting: Emergency Medicine

## 2015-12-14 ENCOUNTER — Ambulatory Visit
Admission: EM | Admit: 2015-12-14 | Discharge: 2015-12-14 | Disposition: A | Discharge status: Home / Self Care | Payer: Medicare Other | Attending: Emergency Medicine | Admitting: Emergency Medicine

## 2015-12-14 DIAGNOSIS — F17210 Nicotine dependence, cigarettes, uncomplicated: Secondary | ICD-10-CM | POA: Diagnosis not present

## 2015-12-14 DIAGNOSIS — H9201 Otalgia, right ear: Secondary | ICD-10-CM | POA: Insufficient documentation

## 2015-12-14 DIAGNOSIS — F329 Major depressive disorder, single episode, unspecified: Secondary | ICD-10-CM | POA: Diagnosis not present

## 2015-12-14 DIAGNOSIS — J069 Acute upper respiratory infection, unspecified: Secondary | ICD-10-CM | POA: Insufficient documentation

## 2015-12-14 DIAGNOSIS — K219 Gastro-esophageal reflux disease without esophagitis: Secondary | ICD-10-CM | POA: Insufficient documentation

## 2015-12-14 DIAGNOSIS — E039 Hypothyroidism, unspecified: Secondary | ICD-10-CM | POA: Diagnosis not present

## 2015-12-14 LAB — RAPID STREP SCREEN (MED CTR MEBANE ONLY): STREPTOCOCCUS, GROUP A SCREEN (DIRECT): NEGATIVE

## 2015-12-14 MED ORDER — SALINE SPRAY 0.65 % NA SOLN: 2.0000 | Freq: Four times a day (QID) | NASAL | Status: DC | PRN

## 2015-12-14 MED ORDER — LORATADINE 10 MG PO TABS: 10.0000 mg | ORAL_TABLET | Freq: Every day | ORAL | Status: DC

## 2015-12-14 NOTE — ED Notes (Signed)
Patient c/o pain in both her ears since yesterday and also reports right sided sinus pain and pressure.

## 2015-12-14 NOTE — ED Provider Notes (Signed)
[facility]  ____________________________________________  Time seen: Approximately 12:24 PM  I have reviewed the triage vital signs and the nursing notes.   HISTORY  Chief Complaint Otalgia   HPI Leslie Wong is a 59 y.o. female presents with a complaint of right ear pain and runny nose 2 days. Patient also reports some sinus discomfort. States current right ear pain is 6/10. Denies hearing changes. Denies drainage, fluid from ear, hearing deficits. Denies fall, head injury, loss of consciousness, trauma.  Reports has continued to eat and drink well without any changes. Denies known fevers. Denies recent sick contacts. Denies cough. Does report mild sore throat.  Denies chest pain, shortness breath, dizziness, vision changes, abdominal pain, neck or back pain.   PCP: Damita Dunnings   Past Medical History  Diagnosis Date  . Depression     BAD  . Hypothyroidism   . Back pain   . Gastroparesis   . Insomnia   . GERD (gastroesophageal reflux disease)   . Migraines   . Urge incontinence   . Recurrent cold sores   . Shoulder bursitis     Patient Active Problem List   Diagnosis Date Noted  . Generalized abdominal pain 02/18/2015  . Osteopenia 04/18/2014  . Routine general medical examination at a health care facility 03/18/2014  . Pleurisy 02/06/2013  . Cough 01/17/2013  . Shoulder pain 01/17/2013  . Chronic back pain 09/14/2011  . Hypercalcemia 03/25/2010  . LEUKOCYTOSIS 03/25/2010  . ABNORMAL INVOLUNTARY MOVEMENTS 05/12/2009  . NAUSEA WITH VOMITING 05/12/2009  . RUQ PAIN 05/12/2009  . INCONTINENCE, URGE 04/16/2009  . HEMATURIA, MICROSCOPIC, HX OF 04/16/2009  . CARPAL TUNNEL SYNDROME, BILATERAL 09/24/2008  . GEOGRAPHIC TONGUE 09/24/2008  . WEIGHT GAIN 09/24/2008  . SHOULDER PAIN, BILATERAL 10/31/2007  . URETHRAL STRICTURE 06/13/2007  . HYPOTHYROIDISM 06/05/2007  . DISORDER, BIPOLAR NOS 06/05/2007  . MIGRAINE HEADACHE 06/05/2007  . GASTROPARESIS  06/05/2007  . HIATAL HERNIA WITH REFLUX 06/05/2007  . IRRITABLE BOWEL SYNDROME 06/05/2007  . FIBROCYSTIC BREAST DISEASE 06/05/2007  . MYOSITIS 06/05/2007    Past Surgical History  Procedure Laterality Date  . Appendectomy    . Tonsillectomy and adenoidectomy    . Laparoscopy      for endometriosis  . Ovarian cyst removal  1990    unilateral  . Abdominal hysterectomy  1999    total  . Esophagogastroduodenoscopy  01/2006    negative except small hiatal hernia  . Esophagogastroduodenoscopy  02/24/2015    See report    Current Outpatient Rx  Name  Route  Sig  Dispense  Refill  . ARIPiprazole (ABILIFY) 20 MG tablet   Oral   Take 50 mg by mouth daily.         Marland Kitchen buPROPion (WELLBUTRIN XL) 300 MG 24 hr tablet   Oral   Take 300 mg by mouth daily.           . diphenhydrAMINE (BENADRYL) 25 MG tablet      Take 2 by mouth prior to Nucynta          . levothyroxine (SYNTHROID, LEVOTHROID) 88 MCG tablet      TAKE 1 TABLET BY MOUTH EVERY DAY   90 tablet   1   . lithium 150 MG capsule   Oral   Take 450 mg by mouth at bedtime.           .           . LORazepam (ATIVAN) 1 MG tablet  Oral   Take 1 mg by mouth 3 (three) times daily as needed.          .           . Multiple Vitamin (MULTIVITAMIN) tablet   Oral   Take 1 tablet by mouth daily.           . nortriptyline (PAMELOR) 25 MG capsule   Oral   Take 25 mg by mouth at bedtime.         Marland Kitchen omeprazole (PRILOSEC) 20 MG capsule      TAKE ONE CAPSULE BY MOUTH EVERY MORNING 45 MINUTES BEFORE BREAKFAST   90 capsule   3     **Patient requests 90 days supply**   .           Marland Kitchen omeprazole (PRILOSEC) 20 MG capsule      Take 1 capsule by mouth every morning 45 minutes before breakfast. * Needs to schedule a physical with Dr. Damita Dunnings for additional refills*   90 capsule   0     **Patient requests 90 days supply**   . ondansetron (ZOFRAN-ODT) 4 MG disintegrating tablet      DISSOLVE 1 TABLET BY MOUTH AS  NEEDED   20 tablet   0   . OVER THE COUNTER MEDICATION      [place] daily for menopausal symptoms.         . Oxybutynin Chloride (GELNIQUE) 10 % GEL   Transdermal   Place onto the skin daily.           Marland Kitchen oxyCODONE (OXY IR/ROXICODONE) 5 MG immediate release tablet   Oral   Take 5 mg by mouth every 4 (four) hours as needed for severe pain.         .           . triamcinolone (KENALOG) 0.025 % cream      Apply to affected areas two to three times per week for itching only.         .           . vitamin E 200 UNIT capsule   Oral   Take 200 Units by mouth daily.             Allergies Aspirin; Baclofen; Chantix; Codeine; Erythromycin; Gabapentin; Metoclopramide hcl; Nsaids; Nucynta; Nucynta er; Prednisone; Pregabalin; Seroquel; Sulfa antibiotics; Topamax; and Vicodin  Family History  Problem Relation Age of Onset  . Hypertension Mother   . Arthritis Mother   . Diabetes Mother   . Cancer Father     lung  . Colon cancer Neg Hx   . Breast cancer Neg Hx     Social History Social History  Substance Use Topics  . Smoking status: Current Every Day Smoker -- 0.50 packs/day for 35 years    Types: Cigarettes  . Smokeless tobacco: Former Systems developer  . Alcohol Use: No    Review of Systems Constitutional: No fever/chills Eyes: No visual changes. ENT: positive runny nose, nasal congestion, sinus pressure, sore throat. Positive right ear pain. Cardiovascular: Denies chest pain. Respiratory: Denies shortness of breath. Gastrointestinal: No abdominal pain.  No nausea, no vomiting.  No diarrhea.  No constipation. Genitourinary: Negative for dysuria. Musculoskeletal: Negative for back pain. Skin: Negative for rash. Neurological: Negative for headaches, focal weakness or numbness.  10-point ROS otherwise negative.  ____________________________________________   PHYSICAL EXAM:  VITAL SIGNS: ED Triage Vitals  Enc Vitals Group     BP 12/14/15 1152 138/90 mmHg  Pulse Rate 12/14/15 1152 98     Resp 12/14/15 1152 16     Temp 12/14/15 1152 98.6 F (37 C)     Temp Source 12/14/15 1152 Tympanic     SpO2 12/14/15 1152 100 %     Weight 12/14/15 1152 115 lb (52.164 kg)     Height 12/14/15 1152 5' (1.524 m)     Head Cir --      Peak Flow --      Pain Score 12/14/15 1157 8     Pain Loc --      Pain Edu? --      Excl. in [place]? --     Constitutional: Alert and oriented. Well appearing and in no acute distress. Eyes: Conjunctivae are normal. PERRL. EOMI. Head: Atraumatic. No swelling, no ecchymosis, no erythema. Right TMJ mild to moderate tenderness to palpation, full range of motion. No trismus. Minimal bilateral maxillary sinuses tender to palpation. Hearing grossly intact bilaterally. No temporal artery TTP.   Ears: no erythema, normal TMs bilaterally,  TMs appear intact. No exudative drainage bilaterally. Nontender and no auricle tenderness bilaterally.   Nose: Mild clear rhinorrhea. Mild nasal turbinate erythema. Nares patent bilaterally.  Mouth/Throat: Mucous membranes are moist.  Mild pharyngeal erythema. No tonsillar swelling or exudate. Neck: No stridor.  No cervical spine tenderness to palpation. Hematological/Lymphatic/Immunilogical: No cervical lymphadenopathy. Cardiovascular: Normal rate, regular rhythm. Grossly normal heart sounds.  Good peripheral circulation. Respiratory: Normal respiratory effort.  No retractions. Lungs CTAB. No wheezes, rales or rhonchi. Good air movement. Gastrointestinal: Soft and nontender. No distention. Normal Bowel sounds.   Musculoskeletal: No lower or upper extremity tenderness nor edema. Bilateral pedal pulses equal and easily palpated.  Neurologic:  Normal speech and language. No gross focal neurologic deficits are appreciated. No gait instability. Skin:  Skin is warm, dry and intact. No rash noted. Psychiatric: Mood and affect are normal. Speech and behavior are  normal.  ____________________________________________   LABS (all labs ordered are listed, but only abnormal results are displayed)  Labs Reviewed  RAPID STREP SCREEN (NOT AT [facility])  CULTURE, GROUP A STREP ([place])     INITIAL IMPRESSION / ASSESSMENT AND PLAN / ED COURSE  Pertinent labs & imaging results that were available during my care of the patient were reviewed by me and considered in my medical decision making (see chart for details).  Very well-appearing patient. No acute distress. Presents for the complaints of 2 days of right otalgia, runny nose, sore throat, and some congestion. Patient also with mild right TMJ tenderness to palpation. No swelling. No trismus. Full range of motion. Suspect viral upper respiratory infection, right TMJ pain. Patient reports allergy to all NSAIDs. Encouraged patient over-the-counter Tylenol as needed, avoidance of very chewy foods or substances, as well as rest. Also discussed, will treat supportively saline nasal sprays and use of over-the-counter Claritin as needed. Encourage rest.   Patient also reports that she wanted evaluated for strep throat. Patient states that she had to leave urgent care prior to quick strep result. Discussed with patient if quick strep result is positive we'll call patient otherwise negative. Quick strep negative, will culture.   Discussed follow up with Primary care physician this week. Discussed follow up and return parameters including no resolution or any worsening concerns. Patient verbalized understanding and agreed to plan.   ____________________________________________   FINAL CLINICAL IMPRESSION(S) / ED DIAGNOSES  Final diagnoses:  Upper respiratory infection  Otalgia, right      Marylene Land,  NP 12/14/15 1354

## 2015-12-14 NOTE — Discharge Instructions (Signed)
Earache An earache, also called otalgia, can be caused by many things. Pain from an earache can be sharp, dull, or burning. The pain may be temporary or constant. Earaches can be caused by problems with the ear, such as infection in either the middle ear or the ear canal, injury, impacted ear wax, middle ear pressure, or a foreign body in the ear. Ear pain can also result from problems in other areas. This is called referred pain. For example, pain can come from a sore throat, a tooth infection, or problems with the jaw or the joint between the jaw and the skull (temporomandibular joint, or TMJ). The cause of an earache is not always easy to identify. Watchful waiting may be appropriate for some earaches until a clear cause of the pain can be found. HOME CARE INSTRUCTIONS Watch your condition for any changes. The following actions may help to lessen any discomfort that you are feeling:  Take medicines only as directed by your health care provider. This includes ear drops.  Apply ice to your outer ear to help reduce pain.  Put ice in a plastic bag.  Place a towel between your skin and the bag.  Leave the ice on for 20 minutes, 2-3 times per day.  Do not put anything in your ear other than medicine that is prescribed by your health care provider.  Try resting in an upright position instead of lying down. This may help to reduce pressure in the middle ear and relieve pain.  Chew gum if it helps to relieve your ear pain.  Control any allergies that you have.  Keep all follow-up visits as directed by your health care provider. This is important. SEEK MEDICAL CARE IF:  Your pain does not improve within 2 days.  You have a fever.  You have new or worsening symptoms. SEEK IMMEDIATE MEDICAL CARE IF:  You have a severe headache.  You have a stiff neck.  You have difficulty swallowing.  You have redness or swelling behind your ear.  You have drainage from your ear.  You have hearing  loss.  You feel dizzy.   This information is not intended to replace advice given to you by your health care provider. Make sure you discuss any questions you have with your health care provider.   Document Released: 07/29/2004 Document Revised: 01/02/2015 Document Reviewed: 07/13/2014 Elsevier Interactive Patient Education 2016 Elsevier Inc.  

## 2015-12-16 LAB — CULTURE, GROUP A STREP (THRC)

## 2015-12-17 DIAGNOSIS — G43909 Migraine, unspecified, not intractable, without status migrainosus: Secondary | ICD-10-CM | POA: Diagnosis not present

## 2015-12-17 DIAGNOSIS — G5603 Carpal tunnel syndrome, bilateral upper limbs: Secondary | ICD-10-CM | POA: Diagnosis not present

## 2015-12-17 DIAGNOSIS — G894 Chronic pain syndrome: Secondary | ICD-10-CM | POA: Diagnosis not present

## 2015-12-17 DIAGNOSIS — Z79891 Long term (current) use of opiate analgesic: Secondary | ICD-10-CM | POA: Diagnosis not present

## 2016-01-26 ENCOUNTER — Other Ambulatory Visit: Payer: Self-pay | Admitting: Family Medicine

## 2016-01-26 ENCOUNTER — Ambulatory Visit
Admission: RE | Admit: 2016-01-26 | Discharge: 2016-01-26 | Disposition: A | Discharge status: Home / Self Care | Payer: Medicare Other | Source: Ambulatory Visit | Attending: Family Medicine | Admitting: Family Medicine

## 2016-01-26 DIAGNOSIS — Z1231 Encounter for screening mammogram for malignant neoplasm of breast: Secondary | ICD-10-CM | POA: Diagnosis not present

## 2016-01-26 HISTORY — DX: Malignant (primary) neoplasm, unspecified: C80.1

## 2016-01-28 ENCOUNTER — Encounter: Payer: Self-pay | Admitting: *Deleted

## 2016-02-02 ENCOUNTER — Other Ambulatory Visit: Payer: Self-pay | Admitting: Family Medicine

## 2016-02-02 NOTE — Telephone Encounter (Signed)
Electronic refill request. Last Filled:    90 capsule 0 10/12/2015  Note was made at last RF that she should schedule an appt, none scheduled.  Please advise.

## 2016-02-03 NOTE — Telephone Encounter (Signed)
Patient notified as instructed by telephone and verbalized understanding. Patient stated that her mom has had a stroke and is in the hospital and will call back and get a 30 minute appointment scheduled when her mom is doing better.

## 2016-02-03 NOTE — Telephone Encounter (Signed)
Sent. Needs 22min OV.  Thanks.

## 2016-03-04 DIAGNOSIS — G5603 Carpal tunnel syndrome, bilateral upper limbs: Secondary | ICD-10-CM | POA: Diagnosis not present

## 2016-03-04 DIAGNOSIS — Z79891 Long term (current) use of opiate analgesic: Secondary | ICD-10-CM | POA: Diagnosis not present

## 2016-03-04 DIAGNOSIS — G894 Chronic pain syndrome: Secondary | ICD-10-CM | POA: Diagnosis not present

## 2016-03-04 DIAGNOSIS — G43909 Migraine, unspecified, not intractable, without status migrainosus: Secondary | ICD-10-CM | POA: Diagnosis not present

## 2016-03-08 DIAGNOSIS — Z634 Disappearance and death of family member: Secondary | ICD-10-CM | POA: Diagnosis not present

## 2016-03-08 DIAGNOSIS — F319 Bipolar disorder, unspecified: Secondary | ICD-10-CM | POA: Diagnosis not present

## 2016-03-08 DIAGNOSIS — F4320 Adjustment disorder, unspecified: Secondary | ICD-10-CM | POA: Diagnosis not present

## 2016-03-09 ENCOUNTER — Ambulatory Visit (INDEPENDENT_AMBULATORY_CARE_PROVIDER_SITE_OTHER): Payer: Medicare Other | Admitting: Family Medicine

## 2016-03-09 ENCOUNTER — Encounter: Payer: Self-pay | Admitting: Family Medicine

## 2016-03-09 ENCOUNTER — Other Ambulatory Visit: Payer: Self-pay | Admitting: Family Medicine

## 2016-03-09 VITALS — BP 118/68 | HR 90 | Temp 98.5°F | Wt 126.0 lb

## 2016-03-09 DIAGNOSIS — E039 Hypothyroidism, unspecified: Secondary | ICD-10-CM

## 2016-03-09 DIAGNOSIS — Z114 Encounter for screening for human immunodeficiency virus [HIV]: Secondary | ICD-10-CM

## 2016-03-09 DIAGNOSIS — F319 Bipolar disorder, unspecified: Secondary | ICD-10-CM

## 2016-03-09 DIAGNOSIS — Z119 Encounter for screening for infectious and parasitic diseases, unspecified: Secondary | ICD-10-CM | POA: Diagnosis not present

## 2016-03-09 DIAGNOSIS — Z1159 Encounter for screening for other viral diseases: Secondary | ICD-10-CM | POA: Diagnosis not present

## 2016-03-09 DIAGNOSIS — E781 Pure hyperglyceridemia: Secondary | ICD-10-CM

## 2016-03-09 DIAGNOSIS — M549 Dorsalgia, unspecified: Secondary | ICD-10-CM

## 2016-03-09 DIAGNOSIS — G8929 Other chronic pain: Secondary | ICD-10-CM

## 2016-03-09 DIAGNOSIS — Z7189 Other specified counseling: Secondary | ICD-10-CM

## 2016-03-09 DIAGNOSIS — E785 Hyperlipidemia, unspecified: Secondary | ICD-10-CM

## 2016-03-09 DIAGNOSIS — Z0000 Encounter for general adult medical examination without abnormal findings: Secondary | ICD-10-CM

## 2016-03-09 DIAGNOSIS — K219 Gastro-esophageal reflux disease without esophagitis: Secondary | ICD-10-CM

## 2016-03-09 DIAGNOSIS — K449 Diaphragmatic hernia without obstruction or gangrene: Secondary | ICD-10-CM

## 2016-03-09 MED ORDER — ARIPIPRAZOLE 20 MG PO TABS: 20.0000 mg | ORAL_TABLET | Freq: Every day | ORAL | Status: DC

## 2016-03-09 MED ORDER — MAGIC MOUTHWASH: 5.0000 mL | Freq: Three times a day (TID) | ORAL | Status: DC | PRN

## 2016-03-09 MED ORDER — LEVOTHYROXINE SODIUM 88 MCG PO TABS: 88.0000 ug | ORAL_TABLET | Freq: Every day | ORAL | Status: DC

## 2016-03-09 MED ORDER — OMEPRAZOLE 20 MG PO CPDR: DELAYED_RELEASE_CAPSULE | ORAL | Status: DC

## 2016-03-09 NOTE — Progress Notes (Signed)
Pre visit review using our clinic review tool, if applicable. No additional management support is needed unless otherwise documented below in the visit note.  CPE- See plan.  Routine anticipatory guidance given to patient.  See health maintenance. Tetanus 2008 PNA and shingles not due.   Flu shot encouraged.   Pap smear not due, s/p hysterectomy.   Mammogram 2017 Colonoscopy 2016 DXA not due yet.   Living will d/w pt.  Daughter designated if patient were incapacitated.   Pt opts in for HCV and HIV screening.  D/w pt re: routine screening.    GERD controlled with PPI.   H/o HLD, due for repeat labs.    Hypothyroid.  Prev on lithium, off now, due for recheck TSH.  No ADE on current med.  Complaint.  No neck mass, dysphagia.   Pain clinic MD has helped patient with oxycodone and TCA.    Psych meds per psych clinic.    PMH and SH reviewed  Meds, vitals, and allergies reviewed.   ROS: See HPI.  Otherwise negative.    GEN: nad, alert and oriented, affect slightly flat but normal conversation o/w.  Judgement appear intact HEENT: mucous membranes moist NECK: supple w/o LA CV: rrr. PULM: ctab, no inc wob ABD: soft, +bs EXT: no edema SKIN: no acute rash

## 2016-03-09 NOTE — Patient Instructions (Signed)
Go to the lab on the way out.  We'll contact you with your lab report. Take care.  Glad to see you.  

## 2016-03-10 DIAGNOSIS — Z7189 Other specified counseling: Secondary | ICD-10-CM | POA: Insufficient documentation

## 2016-03-10 DIAGNOSIS — E785 Hyperlipidemia, unspecified: Secondary | ICD-10-CM | POA: Insufficient documentation

## 2016-03-10 LAB — LIPID PANEL
CHOL/HDL RATIO: 7
CHOLESTEROL: 231 mg/dL — AB (ref 0–200)
HDL: 34.6 mg/dL — AB (ref >39.00)

## 2016-03-10 LAB — COMPREHENSIVE METABOLIC PANEL
ALBUMIN: 4.2 g/dL (ref 3.5–5.2)
ALK PHOS: 104 U/L (ref 39–117)
ALT: 14 U/L (ref 0–35)
AST: 16 U/L (ref 0–37)
BILIRUBIN TOTAL: 0.3 mg/dL (ref 0.2–1.2)
BUN: 10 mg/dL (ref 6–23)
CALCIUM: 11.1 mg/dL — AB (ref 8.4–10.5)
CO2: 32 mEq/L (ref 19–32)
Chloride: 104 mEq/L (ref 96–112)
Creatinine, Ser: 0.97 mg/dL (ref 0.40–1.20)
GFR: 62.85 mL/min (ref >60.00)
Glucose, Bld: 110 mg/dL — ABNORMAL HIGH (ref 70–99)
POTASSIUM: 4.2 meq/L (ref 3.5–5.1)
Sodium: 142 mEq/L (ref 135–145)
TOTAL PROTEIN: 7.1 g/dL (ref 6.0–8.3)

## 2016-03-10 LAB — TSH: TSH: 0.68 u[IU]/mL (ref 0.35–4.50)

## 2016-03-10 LAB — LDL CHOLESTEROL, DIRECT: LDL DIRECT: 136 mg/dL

## 2016-03-10 LAB — HEPATITIS C ANTIBODY: HCV Ab: NEGATIVE

## 2016-03-10 LAB — HIV ANTIBODY (ROUTINE TESTING W REFLEX): HIV: NONREACTIVE

## 2016-03-10 NOTE — Assessment & Plan Note (Signed)
Tetanus 2008 PNA and shingles not due.   Flu shot encouraged.   Pap smear not due, s/p hysterectomy.   Mammogram 2017 Colonoscopy 2016 DXA not due yet.   Living will d/w pt.  Daughter designated if patient were incapacitated.   Pt opts in for HCV and HIV screening.  D/w pt re: routine screening.

## 2016-03-10 NOTE — Assessment & Plan Note (Signed)
Per pain clinic 

## 2016-03-10 NOTE — Assessment & Plan Note (Signed)
Recheck tsh

## 2016-03-10 NOTE — Assessment & Plan Note (Signed)
Repeat labs pending

## 2016-03-10 NOTE — Assessment & Plan Note (Signed)
Per psych 

## 2016-03-10 NOTE — Assessment & Plan Note (Signed)
Continue ppi

## 2016-03-16 ENCOUNTER — Other Ambulatory Visit: Payer: Self-pay | Admitting: Family Medicine

## 2016-03-17 ENCOUNTER — Other Ambulatory Visit: Payer: Self-pay | Admitting: Family Medicine

## 2016-03-18 ENCOUNTER — Other Ambulatory Visit: Payer: Medicare Other

## 2016-03-21 ENCOUNTER — Other Ambulatory Visit (INDEPENDENT_AMBULATORY_CARE_PROVIDER_SITE_OTHER): Payer: Medicare Other

## 2016-03-21 LAB — VITAMIN D 25 HYDROXY (VIT D DEFICIENCY, FRACTURES): VITD: 27.13 ng/mL — AB (ref 30.00–100.00)

## 2016-03-21 NOTE — Addendum Note (Signed)
Addended by: Marchia Bond on: 03/21/2016 01:24 PM   Modules accepted: Orders

## 2016-03-22 LAB — PTH, INTACT AND CALCIUM
Calcium: 10.8 mg/dL — ABNORMAL HIGH (ref 8.4–10.5)
PTH: 57 pg/mL (ref 14–64)

## 2016-03-28 ENCOUNTER — Telehealth: Payer: Self-pay | Admitting: Family Medicine

## 2016-03-28 ENCOUNTER — Other Ambulatory Visit: Payer: Self-pay | Admitting: Family Medicine

## 2016-03-28 NOTE — Telephone Encounter (Signed)
Called patient back with her lab results °

## 2016-03-28 NOTE — Telephone Encounter (Signed)
Pt returned call re: labs.  Best number to call pt is [phone]

## 2016-04-01 ENCOUNTER — Ambulatory Visit
Admission: RE | Admit: 2016-04-01 | Discharge: 2016-04-01 | Disposition: A | Discharge status: Home / Self Care | Payer: Medicare Other | Source: Ambulatory Visit | Attending: Family Medicine | Admitting: Family Medicine

## 2016-04-01 DIAGNOSIS — R599 Enlarged lymph nodes, unspecified: Secondary | ICD-10-CM | POA: Insufficient documentation

## 2016-04-01 DIAGNOSIS — E042 Nontoxic multinodular goiter: Secondary | ICD-10-CM | POA: Diagnosis not present

## 2016-04-06 ENCOUNTER — Other Ambulatory Visit: Payer: Medicare Other

## 2016-04-07 ENCOUNTER — Other Ambulatory Visit (INDEPENDENT_AMBULATORY_CARE_PROVIDER_SITE_OTHER): Payer: Medicare Other

## 2016-04-16 LAB — PTH-RELATED PEPTIDE: PTH-Related Protein (PTH-RP): 13 pg/mL — ABNORMAL LOW (ref 14–27)

## 2016-04-22 ENCOUNTER — Other Ambulatory Visit: Payer: Self-pay | Admitting: Family Medicine

## 2016-05-20 DIAGNOSIS — F419 Anxiety disorder, unspecified: Secondary | ICD-10-CM | POA: Diagnosis not present

## 2016-05-25 ENCOUNTER — Other Ambulatory Visit: Payer: Self-pay | Admitting: Family Medicine

## 2016-05-25 NOTE — Telephone Encounter (Signed)
Received refill request electronically Last refill 10/29/15 #20 Last office visit 03/09/16

## 2016-05-26 NOTE — Telephone Encounter (Signed)
Sent. Thanks.   

## 2016-06-22 DIAGNOSIS — G43909 Migraine, unspecified, not intractable, without status migrainosus: Secondary | ICD-10-CM | POA: Diagnosis not present

## 2016-06-22 DIAGNOSIS — Z79891 Long term (current) use of opiate analgesic: Secondary | ICD-10-CM | POA: Diagnosis not present

## 2016-06-22 DIAGNOSIS — Z5181 Encounter for therapeutic drug level monitoring: Secondary | ICD-10-CM | POA: Diagnosis not present

## 2016-06-22 DIAGNOSIS — G894 Chronic pain syndrome: Secondary | ICD-10-CM | POA: Diagnosis not present

## 2016-06-22 DIAGNOSIS — G5603 Carpal tunnel syndrome, bilateral upper limbs: Secondary | ICD-10-CM | POA: Diagnosis not present

## 2016-08-13 IMAGING — CT CT CTA ABD/PEL W/CM AND/OR W/O CM
2 of 6 series · 14 of 46 positions shown, 16 images · IV contrast (APPLIED)
Comparison: CT scan of the abdomen February 18, 2015; CT scan of
the chest of June 25, 2014.

CLINICAL DATA: Chest pain, right upper quadrant abdominal pain.

EXAM:
CT ANGIOGRAPHY CHEST, ABDOMEN AND PELVIS
TECHNIQUE: Multidetector CT imaging through the chest, abdomen and pelvis was
performed using the standard protocol during bolus administration of
intravenous contrast. Multiplanar reconstructed images and MIPs were
obtained and reviewed to evaluate the vascular anatomy.
CONTRAST:  100mL OMNIPAQUE IOHEXOL 350 MG/ML SOLN

[Series 6: arterial · axial · arterial · 0.60mm/px · z∈[-852,-362]mm · 11 of 291 slices shown, 13 images]
[im 23/291  soft-tissue]
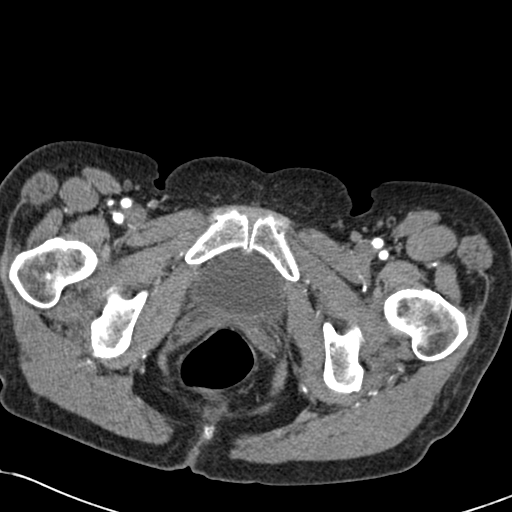
[im 23/291  bone]
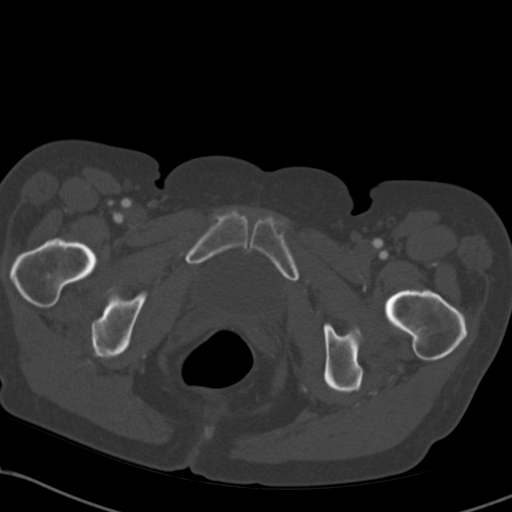
[im 45/291  soft-tissue]
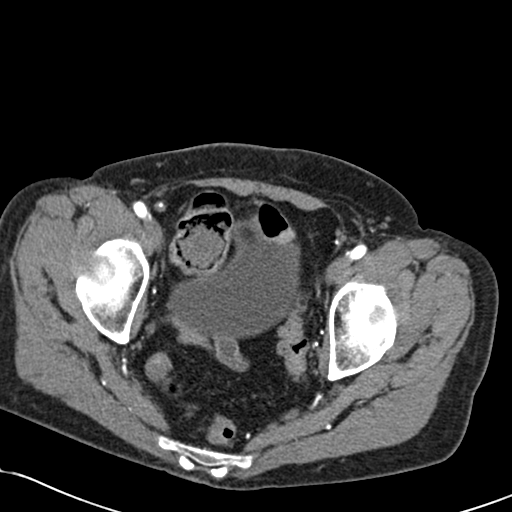
[im 67/291  soft-tissue]
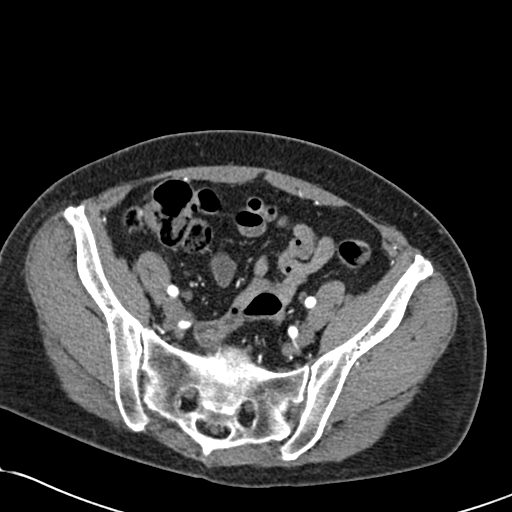
[im 90/291  soft-tissue]
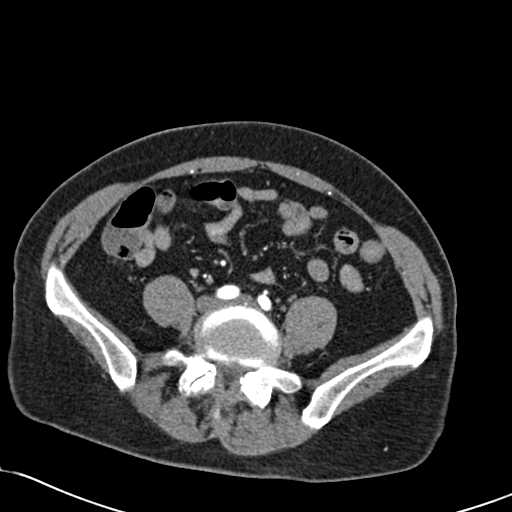
[im 123/291  soft-tissue]
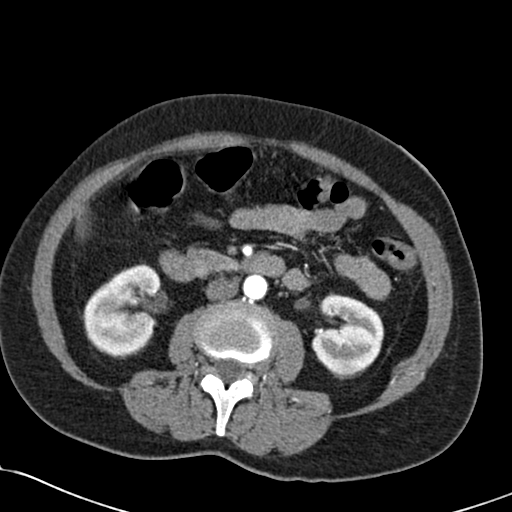
[im 146/291  soft-tissue]
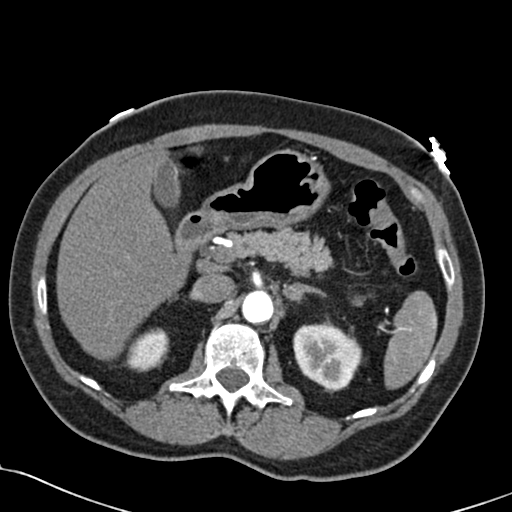
[im 168/291  soft-tissue]
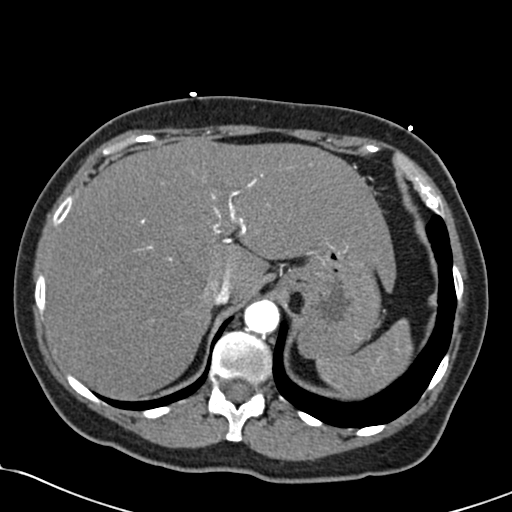
[im 201/291  soft-tissue]
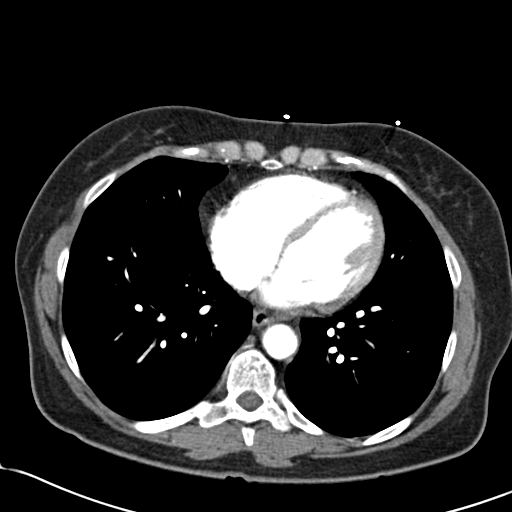
[im 224/291  soft-tissue]
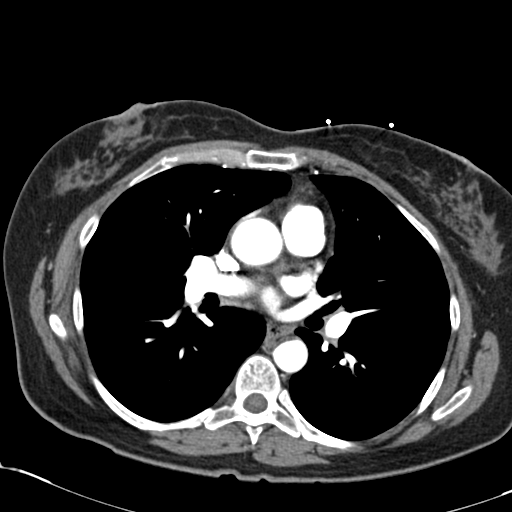
[im 224/291  bone]
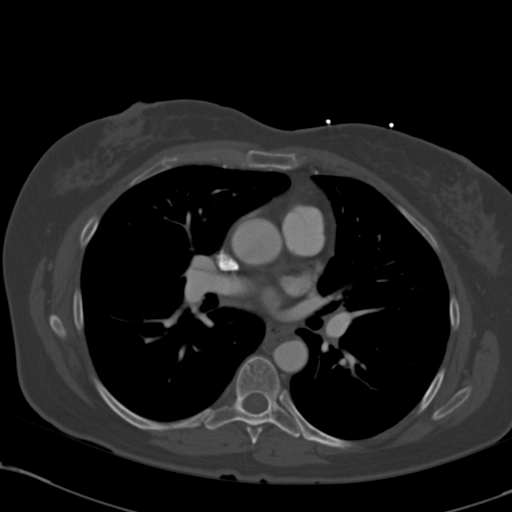
[im 246/291  soft-tissue]
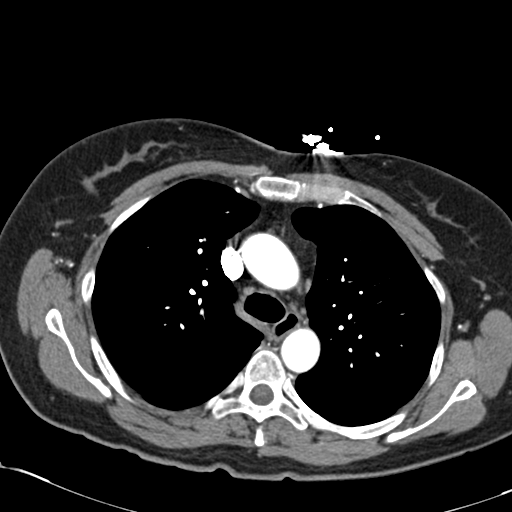
[im 268/291  soft-tissue]
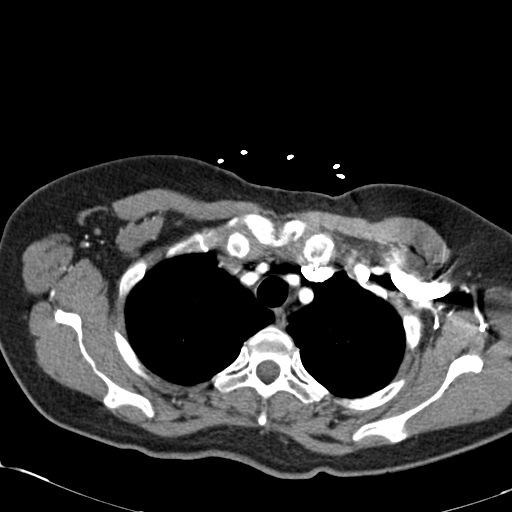

[Series 8: cor arterial mpr · coronal · arterial · 0.66mm/px · 3 of 107 slices shown]
[im 27/107  soft-tissue]
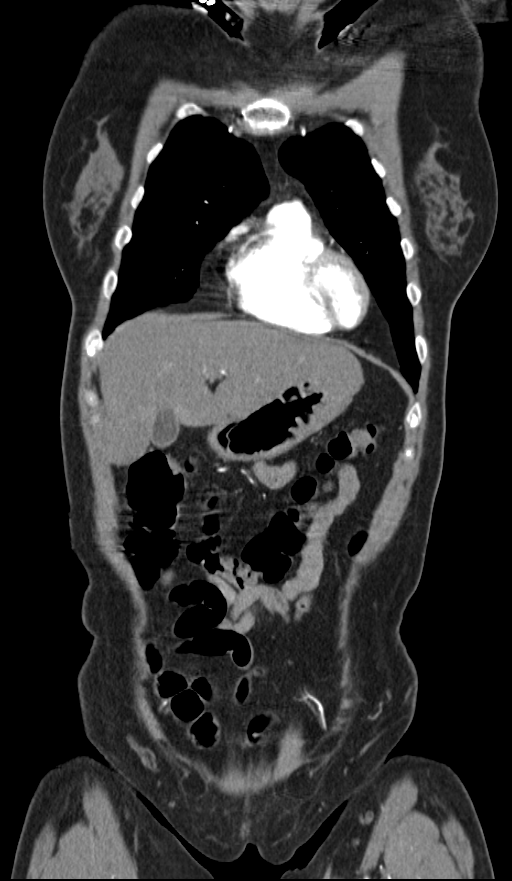
[im 54/107  soft-tissue]
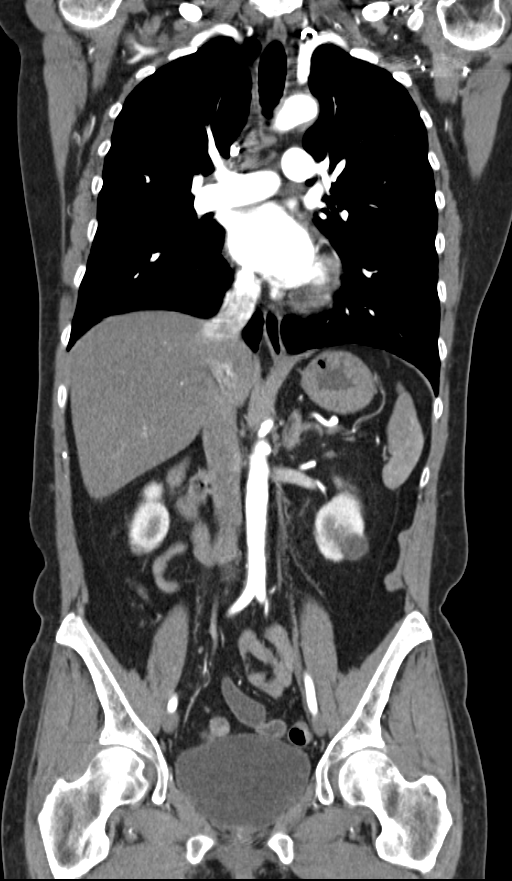
[im 80/107  soft-tissue]
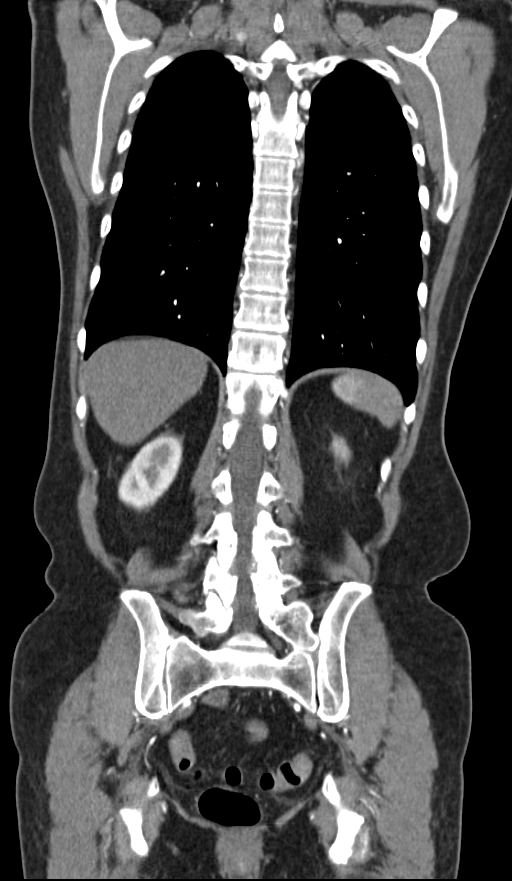

[14 of 46 positions shown; findings below may reference images not displayed]

FINDINGS: CTA CHEST FINDINGS

No pneumothorax or pleural effusion is noted. No acute pulmonary
disease is noted. Stable 4 mm nodule is noted laterally in left
upper lobe best seen on image number 17 of series 4. Stable 4 mm
nodule is noted in right lung apex. There is no evidence of thoracic
aortic dissection or aneurysm. There is no definite evidence of
pulmonary embolus. Great vessels are widely patent without
significant stenosis. No mediastinal mass or adenopathy is noted. No
significant osseous abnormality is noted in the chest.

Review of the MIP images confirms the above findings.

CTA ABDOMEN AND PELVIS FINDINGS

There is no evidence of abdominal aortic aneurysm or dissection.
Atherosclerosis of abdominal aorta is noted. The mesenteric and
renal arteries are widely patent without significant stenosis. Fatty
infiltration of the liver is noted. No significant abnormality is
noted in the spleen or pancreas. Adrenal glands appear normal. No
hydronephrosis or renal obstruction is noted. Left renal cysts are
noted. There is no evidence of bowel obstruction. No abnormal fluid
collection is noted. Urinary bladder appears normal. Status post
hysterectomy. No significant adenopathy is noted.

Review of the MIP images confirms the above findings.
IMPRESSION: There is no evidence of thoracic or abdominal aortic aneurysm or
dissection.

Stable bilateral 4 mm pulmonary nodules are noted compared to prior
exam. They can be considered benign with no further follow-up
required.

Probable fatty infiltration of the liver. No other significant
abnormality is noted in the chest, abdomen or pelvis.

## 2016-09-21 ENCOUNTER — Telehealth: Payer: Self-pay | Admitting: *Deleted

## 2016-09-21 NOTE — Telephone Encounter (Signed)
Received a fax request from pharmacy for a new script for Chantix.

## 2016-09-22 NOTE — Telephone Encounter (Signed)
I need some details.  I didn't see where she had been on this med prev.  Thanks.

## 2016-09-22 NOTE — Telephone Encounter (Signed)
Left detailed message on voicemail.  

## 2016-09-22 NOTE — Telephone Encounter (Signed)
Left detailed message on voicemail to return call with information. 

## 2016-09-23 DIAGNOSIS — F331 Major depressive disorder, recurrent, moderate: Secondary | ICD-10-CM | POA: Diagnosis not present

## 2016-10-17 NOTE — Telephone Encounter (Signed)
Did patient return phone call? Can encounter be closed?

## 2016-10-26 DIAGNOSIS — Z79891 Long term (current) use of opiate analgesic: Secondary | ICD-10-CM | POA: Diagnosis not present

## 2016-10-26 DIAGNOSIS — Z720 Tobacco use: Secondary | ICD-10-CM | POA: Diagnosis not present

## 2016-10-26 DIAGNOSIS — F17200 Nicotine dependence, unspecified, uncomplicated: Secondary | ICD-10-CM | POA: Diagnosis not present

## 2016-10-26 DIAGNOSIS — G894 Chronic pain syndrome: Secondary | ICD-10-CM | POA: Diagnosis not present

## 2016-10-26 DIAGNOSIS — G5603 Carpal tunnel syndrome, bilateral upper limbs: Secondary | ICD-10-CM | POA: Diagnosis not present

## 2016-10-26 DIAGNOSIS — Z87891 Personal history of nicotine dependence: Secondary | ICD-10-CM | POA: Diagnosis not present

## 2016-10-26 DIAGNOSIS — Z716 Tobacco abuse counseling: Secondary | ICD-10-CM | POA: Diagnosis not present

## 2016-10-26 DIAGNOSIS — G43909 Migraine, unspecified, not intractable, without status migrainosus: Secondary | ICD-10-CM | POA: Diagnosis not present

## 2016-10-27 ENCOUNTER — Other Ambulatory Visit: Payer: Self-pay | Admitting: Family Medicine

## 2016-12-12 DIAGNOSIS — F319 Bipolar disorder, unspecified: Secondary | ICD-10-CM | POA: Diagnosis not present

## 2017-01-16 DIAGNOSIS — G894 Chronic pain syndrome: Secondary | ICD-10-CM | POA: Diagnosis not present

## 2017-01-16 DIAGNOSIS — Z79891 Long term (current) use of opiate analgesic: Secondary | ICD-10-CM | POA: Diagnosis not present

## 2017-01-16 DIAGNOSIS — Z720 Tobacco use: Secondary | ICD-10-CM | POA: Diagnosis not present

## 2017-01-16 DIAGNOSIS — Z716 Tobacco abuse counseling: Secondary | ICD-10-CM | POA: Diagnosis not present

## 2017-01-16 DIAGNOSIS — F17200 Nicotine dependence, unspecified, uncomplicated: Secondary | ICD-10-CM | POA: Diagnosis not present

## 2017-01-16 DIAGNOSIS — Z87891 Personal history of nicotine dependence: Secondary | ICD-10-CM | POA: Diagnosis not present

## 2017-01-16 DIAGNOSIS — G43909 Migraine, unspecified, not intractable, without status migrainosus: Secondary | ICD-10-CM | POA: Diagnosis not present

## 2017-01-16 DIAGNOSIS — G5603 Carpal tunnel syndrome, bilateral upper limbs: Secondary | ICD-10-CM | POA: Diagnosis not present

## 2017-02-10 DIAGNOSIS — Z23 Encounter for immunization: Secondary | ICD-10-CM | POA: Diagnosis not present

## 2017-02-24 DIAGNOSIS — Z716 Tobacco abuse counseling: Secondary | ICD-10-CM | POA: Diagnosis not present

## 2017-02-24 DIAGNOSIS — G5603 Carpal tunnel syndrome, bilateral upper limbs: Secondary | ICD-10-CM | POA: Diagnosis not present

## 2017-02-24 DIAGNOSIS — Z720 Tobacco use: Secondary | ICD-10-CM | POA: Diagnosis not present

## 2017-02-24 DIAGNOSIS — Z87891 Personal history of nicotine dependence: Secondary | ICD-10-CM | POA: Diagnosis not present

## 2017-02-24 DIAGNOSIS — G3184 Mild cognitive impairment, so stated: Secondary | ICD-10-CM | POA: Diagnosis not present

## 2017-02-24 DIAGNOSIS — G43909 Migraine, unspecified, not intractable, without status migrainosus: Secondary | ICD-10-CM | POA: Diagnosis not present

## 2017-02-24 DIAGNOSIS — G894 Chronic pain syndrome: Secondary | ICD-10-CM | POA: Diagnosis not present

## 2017-02-24 DIAGNOSIS — F17200 Nicotine dependence, unspecified, uncomplicated: Secondary | ICD-10-CM | POA: Diagnosis not present

## 2017-02-24 DIAGNOSIS — Z79891 Long term (current) use of opiate analgesic: Secondary | ICD-10-CM | POA: Diagnosis not present

## 2017-03-31 DIAGNOSIS — F3176 Bipolar disorder, in full remission, most recent episode depressed: Secondary | ICD-10-CM | POA: Diagnosis not present

## 2017-04-03 DIAGNOSIS — Z720 Tobacco use: Secondary | ICD-10-CM | POA: Diagnosis not present

## 2017-04-03 DIAGNOSIS — G894 Chronic pain syndrome: Secondary | ICD-10-CM | POA: Diagnosis not present

## 2017-04-03 DIAGNOSIS — F17200 Nicotine dependence, unspecified, uncomplicated: Secondary | ICD-10-CM | POA: Diagnosis not present

## 2017-04-03 DIAGNOSIS — Z79891 Long term (current) use of opiate analgesic: Secondary | ICD-10-CM | POA: Diagnosis not present

## 2017-04-03 DIAGNOSIS — Z87891 Personal history of nicotine dependence: Secondary | ICD-10-CM | POA: Diagnosis not present

## 2017-04-03 DIAGNOSIS — Z716 Tobacco abuse counseling: Secondary | ICD-10-CM | POA: Diagnosis not present

## 2017-04-03 DIAGNOSIS — G5603 Carpal tunnel syndrome, bilateral upper limbs: Secondary | ICD-10-CM | POA: Diagnosis not present

## 2017-04-03 DIAGNOSIS — G43909 Migraine, unspecified, not intractable, without status migrainosus: Secondary | ICD-10-CM | POA: Diagnosis not present

## 2017-05-04 DIAGNOSIS — M1812 Unilateral primary osteoarthritis of first carpometacarpal joint, left hand: Secondary | ICD-10-CM | POA: Diagnosis not present

## 2017-05-04 DIAGNOSIS — M7552 Bursitis of left shoulder: Secondary | ICD-10-CM | POA: Diagnosis not present

## 2017-05-04 DIAGNOSIS — M7551 Bursitis of right shoulder: Secondary | ICD-10-CM | POA: Diagnosis not present

## 2017-05-15 ENCOUNTER — Other Ambulatory Visit: Payer: Self-pay | Admitting: Family Medicine

## 2017-05-15 NOTE — Telephone Encounter (Signed)
Sent.  Needs yearly f/u when possible.  Thanks.

## 2017-05-15 NOTE — Telephone Encounter (Signed)
Received refill electronically Last refill 10/28/16 #20 Last office visit 03/09/16

## 2017-05-16 ENCOUNTER — Other Ambulatory Visit: Payer: Self-pay | Admitting: Family Medicine

## 2017-05-16 NOTE — Telephone Encounter (Signed)
Patient advised.

## 2017-05-19 DIAGNOSIS — F4310 Post-traumatic stress disorder, unspecified: Secondary | ICD-10-CM | POA: Diagnosis not present

## 2017-05-19 DIAGNOSIS — Z87891 Personal history of nicotine dependence: Secondary | ICD-10-CM | POA: Diagnosis not present

## 2017-05-19 DIAGNOSIS — F3176 Bipolar disorder, in full remission, most recent episode depressed: Secondary | ICD-10-CM | POA: Diagnosis not present

## 2017-05-29 ENCOUNTER — Other Ambulatory Visit: Payer: Self-pay | Admitting: Family Medicine

## 2017-05-29 NOTE — Telephone Encounter (Signed)
Electronic refill request. Last office visit:   03/09/16 CPE Last Filled:    30 tablet 5 04/22/2016  Please advise.

## 2017-05-29 NOTE — Telephone Encounter (Signed)
Sent. Needs CPE when possible.  Thanks.  

## 2017-05-30 ENCOUNTER — Other Ambulatory Visit: Payer: Self-pay | Admitting: Family Medicine

## 2017-05-30 NOTE — Telephone Encounter (Signed)
Patient has already scheduled with [facility] and Dr. Damita Dunnings.

## 2017-06-05 DIAGNOSIS — G5603 Carpal tunnel syndrome, bilateral upper limbs: Secondary | ICD-10-CM | POA: Diagnosis not present

## 2017-06-05 DIAGNOSIS — Z79891 Long term (current) use of opiate analgesic: Secondary | ICD-10-CM | POA: Diagnosis not present

## 2017-06-05 DIAGNOSIS — G43909 Migraine, unspecified, not intractable, without status migrainosus: Secondary | ICD-10-CM | POA: Diagnosis not present

## 2017-06-05 DIAGNOSIS — Z716 Tobacco abuse counseling: Secondary | ICD-10-CM | POA: Diagnosis not present

## 2017-06-05 DIAGNOSIS — G894 Chronic pain syndrome: Secondary | ICD-10-CM | POA: Diagnosis not present

## 2017-06-05 DIAGNOSIS — Z720 Tobacco use: Secondary | ICD-10-CM | POA: Diagnosis not present

## 2017-06-05 DIAGNOSIS — Z87891 Personal history of nicotine dependence: Secondary | ICD-10-CM | POA: Diagnosis not present

## 2017-06-05 DIAGNOSIS — F17200 Nicotine dependence, unspecified, uncomplicated: Secondary | ICD-10-CM | POA: Diagnosis not present

## 2017-06-11 ENCOUNTER — Other Ambulatory Visit: Payer: Self-pay | Admitting: Family Medicine

## 2017-06-11 DIAGNOSIS — M899 Disorder of bone, unspecified: Secondary | ICD-10-CM

## 2017-06-11 DIAGNOSIS — E785 Hyperlipidemia, unspecified: Secondary | ICD-10-CM

## 2017-06-11 DIAGNOSIS — M949 Disorder of cartilage, unspecified: Secondary | ICD-10-CM

## 2017-06-11 DIAGNOSIS — E039 Hypothyroidism, unspecified: Secondary | ICD-10-CM

## 2017-06-11 DIAGNOSIS — M8580 Other specified disorders of bone density and structure, unspecified site: Secondary | ICD-10-CM

## 2017-06-16 ENCOUNTER — Ambulatory Visit (INDEPENDENT_AMBULATORY_CARE_PROVIDER_SITE_OTHER): Payer: Medicare Other

## 2017-06-16 VITALS — BP 112/70 | HR 96 | Temp 98.8°F | Ht 59.75 in | Wt 131.0 lb

## 2017-06-16 DIAGNOSIS — E785 Hyperlipidemia, unspecified: Secondary | ICD-10-CM

## 2017-06-16 DIAGNOSIS — Z0000 Encounter for general adult medical examination without abnormal findings: Secondary | ICD-10-CM

## 2017-06-16 DIAGNOSIS — M8580 Other specified disorders of bone density and structure, unspecified site: Secondary | ICD-10-CM

## 2017-06-16 DIAGNOSIS — M899 Disorder of bone, unspecified: Secondary | ICD-10-CM | POA: Diagnosis not present

## 2017-06-16 DIAGNOSIS — M949 Disorder of cartilage, unspecified: Secondary | ICD-10-CM | POA: Diagnosis not present

## 2017-06-16 DIAGNOSIS — E039 Hypothyroidism, unspecified: Secondary | ICD-10-CM | POA: Diagnosis not present

## 2017-06-16 LAB — COMPREHENSIVE METABOLIC PANEL
ALBUMIN: 4.1 g/dL (ref 3.5–5.2)
ALK PHOS: 93 U/L (ref 39–117)
ALT: 19 U/L (ref 0–35)
AST: 17 U/L (ref 0–37)
BILIRUBIN TOTAL: 0.2 mg/dL (ref 0.2–1.2)
BUN: 8 mg/dL (ref 6–23)
CALCIUM: 10.5 mg/dL (ref 8.4–10.5)
CO2: 29 mEq/L (ref 19–32)
CREATININE: 1 mg/dL (ref 0.40–1.20)
Chloride: 106 mEq/L (ref 96–112)
GFR: 60.41 mL/min (ref >60.00)
Glucose, Bld: 94 mg/dL (ref 70–99)
Potassium: 4 mEq/L (ref 3.5–5.1)
Sodium: 141 mEq/L (ref 135–145)
Total Protein: 6.7 g/dL (ref 6.0–8.3)

## 2017-06-16 LAB — VITAMIN D 25 HYDROXY (VIT D DEFICIENCY, FRACTURES): VITD: 22.51 ng/mL — ABNORMAL LOW (ref 30.00–100.00)

## 2017-06-16 LAB — LDL CHOLESTEROL, DIRECT: Direct LDL: 123 mg/dL

## 2017-06-16 LAB — LIPID PANEL
CHOLESTEROL: 233 mg/dL — AB (ref 0–200)
HDL: 32 mg/dL — AB (ref >39.00)
Total CHOL/HDL Ratio: 7

## 2017-06-16 LAB — TSH: TSH: 0.61 u[IU]/mL (ref 0.35–4.50)

## 2017-06-16 NOTE — Progress Notes (Signed)
Subjective:   Leslie Wong is a 61 y.o. female who presents for Medicare Annual (Subsequent) preventive examination.  Review of Systems:  N/A Cardiac Risk Factors include: advanced age (>36men, >45 women);dyslipidemia     Objective:     Vitals: BP 112/70 (BP Location: Right Arm, Patient Position: Sitting, Cuff Size: Normal)   Pulse 96   Temp 98.8 F (37.1 C) (Oral)   Ht 4' 11.75" (1.518 m) Comment: no shoes  Wt 131 lb (59.4 kg)   SpO2 95%   BMI 25.80 kg/m   Body mass index is 25.8 kg/m.   Tobacco History  Smoking Status  . Current Every Day Smoker  . Packs/day: 0.50  . Years: 35.00  . Types: Cigarettes  Smokeless Tobacco  . Former Systems developer     Ready to quit: Yes Counseling given: No   Past Medical History:  Diagnosis Date  . Back pain   . Cancer ([place])    skin  . Depression    BAD  . Gastroparesis   . GERD (gastroesophageal reflux disease)   . Hypothyroidism   . Insomnia   . Migraines   . Recurrent cold sores   . Shoulder bursitis   . Urge incontinence    Past Surgical History:  Procedure Laterality Date  . ABDOMINAL HYSTERECTOMY  1999   total  . APPENDECTOMY    . ESOPHAGOGASTRODUODENOSCOPY  01/2006   negative except small hiatal hernia  . ESOPHAGOGASTRODUODENOSCOPY  02/24/2015   See report  . LAPAROSCOPY     for endometriosis  . OVARIAN CYST REMOVAL  1990   unilateral  . TONSILLECTOMY AND ADENOIDECTOMY     Family History  Problem Relation Age of Onset  . Hypertension Mother   . Arthritis Mother   . Diabetes Mother   . Stroke Mother   . Cancer Father        lung  . Colon cancer Neg Hx   . Breast cancer Neg Hx    History  Sexual Activity  . Sexual activity: No    Outpatient Encounter Prescriptions as of 06/16/2017  Medication Sig  . ARIPiprazole (ABILIFY) 20 MG tablet Take 1 tablet (20 mg total) by mouth daily.  Marland Kitchen buPROPion (WELLBUTRIN XL) 300 MG 24 hr tablet Take 300 mg by mouth daily.    . diphenhydrAMINE (BENADRYL) 25  MG tablet Take 2 by mouth prior to Nucynta   . levothyroxine (SYNTHROID, LEVOTHROID) 88 MCG tablet TAKE 1 TABLET(88 MCG) BY MOUTH DAILY  . loratadine (CLARITIN) 10 MG tablet Take 1 tablet (10 mg total) by mouth daily.  . magic mouthwash SOLN Take 5 mLs by mouth 3 (three) times daily as needed for mouth pain.  . Multiple Vitamin (MULTIVITAMIN) tablet Take 1 tablet by mouth daily.    . nortriptyline (PAMELOR) 25 MG capsule Take 25 mg by mouth at bedtime.  Marland Kitchen omeprazole (PRILOSEC) 20 MG capsule TAKE 1 CAPSULE BY MOUTH EVERY MORNING 45 MINUTES BEFORE BREAKFAST  . ondansetron (ZOFRAN-ODT) 4 MG disintegrating tablet DISSOLVE 1 TABLET BY MOUTH AS NEEDED  . OVER THE COUNTER MEDICATION Black Kohash daily for menopausal symptoms.  . Oxybutynin Chloride (GELNIQUE) 10 % GEL Place onto the skin daily.    Marland Kitchen oxyCODONE (OXY IR/ROXICODONE) 5 MG immediate release tablet Take 5 mg by mouth every 4 (four) hours as needed for severe pain.  . sodium chloride (OCEAN) 0.65 % SOLN nasal spray Place 2 sprays into the nose every 6 (six) hours as needed for congestion.  . triamcinolone (  KENALOG) 0.025 % cream Apply to affected areas two to three times per week for itching only.  . valACYclovir (VALTREX) 500 MG tablet TAKE 1 TABLET BY MOUTH TWICE DAILY AS NEEDED  . vitamin E 200 UNIT capsule Take 200 Units by mouth daily.    . [DISCONTINUED] LORazepam (ATIVAN) 1 MG tablet Take 1 mg by mouth 3 (three) times daily as needed.    No facility-administered encounter medications on file as of 06/16/2017.     Activities of Daily Living In your present state of health, do you have any difficulty performing the following activities: 06/16/2017  Hearing? Y  Vision? N  Difficulty concentrating or making decisions? N  Walking or climbing stairs? Y  Dressing or bathing? N  Doing errands, shopping? N  Preparing Food and eating ? N  Using the Toilet? N  In the past six months, have you accidently leaked urine? N  Do you have  problems with loss of bowel control? N  Managing your Medications? N  Managing your Finances? N  Housekeeping or managing your Housekeeping? N  Some recent data might be hidden    Patient Care Team: Tonia Ghent, MD as PCP - General (Family Medicine)    Assessment:     Hearing Screening   125Hz  250Hz  500Hz  1000Hz  2000Hz  3000Hz  4000Hz  6000Hz  8000Hz   Right ear:   0 0 0  0    Left ear:   0 0 0  0    Vision Screening Comments: Last vision exam in March 2017 - [place]   Exercise Activities and Dietary recommendations Current Exercise Habits: Home exercise routine, Type of exercise: walking, Time (Minutes): 15, Frequency (Times/Week): 7, Weekly Exercise (Minutes/Week): 105, Intensity: Mild, Exercise limited by: None identified  Goals    . Increase physical activity          Starting 06/16/2017, I will continue to walk at least 15 min daily.       Fall Risk Fall Risk  06/16/2017  Falls in the past year? No   Depression Screen PHQ 2/9 Scores 06/16/2017  PHQ - 2 Score 0     Cognitive Function MMSE - Mini Mental State Exam 06/16/2017  Orientation to time 5  Orientation to Place 5  Registration 3  Attention/ Calculation 0  Recall 3  Language- name 2 objects 0  Language- repeat 1  Language- follow 3 step command 3  Language- read & follow direction 0  Write a sentence 0  Copy design 0  Total score 20       PLEASE NOTE: A Mini-Cog screen was completed. Maximum score is 20. A value of 0 denotes this part of [place] MMSE was not completed or the patient failed this part of the Mini-Cog screening.   Mini-Cog Screening Orientation to Time - Max 5 pts Orientation to Place - Max 5 pts Registration - Max 3 pts Recall - Max 3 pts Language Repeat - Max 1 pts Language Follow 3 Step Command - Max 3 pts   Immunization History  Administered Date(s) Administered  . Influenza Whole 09/17/2010  . Influenza-Unspecified 09/25/2016  . Td 12/26/1994,  10/31/2007   Screening Tests Health Maintenance  Topic Date Due  . INFLUENZA VACCINE  07/26/2017  . TETANUS/TDAP  10/30/2017  . MAMMOGRAM  01/25/2018  . COLONOSCOPY  02/26/2025  . Hepatitis C Screening  Completed  . HIV Screening  Completed      Plan:     I have personally reviewed and  addressed the Medicare Annual Wellness questionnaire and have noted the following in the patient's chart:  A. Medical and social history B. Use of alcohol, tobacco or illicit drugs  C. Current medications and supplements D. Functional ability and status E.  Nutritional status F.  Physical activity G. Advance directives H. List of other physicians I.  Hospitalizations, surgeries, and ER visits in previous 12 months J.  [place] to include hearing, vision, cognitive, depression L. Referrals and appointments - none  In addition, I have reviewed and discussed with patient certain preventive protocols, quality metrics, and best practice recommendations. A written personalized care plan for preventive services as well as general preventive health recommendations were provided to patient.  See attached scanned questionnaire for additional information.   Signed,   Lindell Noe, MHA, BS, LPN Health Coach

## 2017-06-16 NOTE — Patient Instructions (Signed)
Leslie Wong , Thank you for taking time to come for your Medicare Wellness Visit. I appreciate your ongoing commitment to your health goals. Please review the following plan we discussed and let me know if I can assist you in the future.   These are the goals we discussed: Goals    . Increase physical activity          Starting 06/16/2017, I will continue to walk at least 15 min daily.        This is a list of the screening recommended for you and due dates:  Health Maintenance  Topic Date Due  . Flu Shot  07/26/2017  . Tetanus Vaccine  10/30/2017  . Mammogram  01/25/2018  . Colon Cancer Screening  02/26/2025  .  Hepatitis C: One time screening is recommended by Center for Disease Control  (CDC) for  adults born from [birth year] through 1963.   Completed  . HIV Screening  Completed   Preventive Care for Adults  A healthy lifestyle and preventive care can promote health and wellness. Preventive health guidelines for adults include the following key practices.  . A routine yearly physical is a good way to check with your health care provider about your health and preventive screening. It is a chance to share any concerns and updates on your health and to receive a thorough exam.  . Visit your dentist for a routine exam and preventive care every 6 months. Brush your teeth twice a day and floss once a day. Good oral hygiene prevents tooth decay and gum disease.  . The frequency of eye exams is based on your age, health, family medical history, use  of contact lenses, and other factors. Follow your health care provider's ecommendations for frequency of eye exams.  . Eat a healthy diet. Foods like vegetables, fruits, whole grains, low-fat dairy products, and lean protein foods contain the nutrients you need without too many calories. Decrease your intake of foods high in solid fats, added sugars, and salt. Eat the right amount of calories for you. Get information about a proper diet from your  health care provider, if necessary.  . Regular physical exercise is one of the most important things you can do for your health. Most adults should get at least 150 minutes of moderate-intensity exercise (any activity that increases your heart rate and causes you to sweat) each week. In addition, most adults need muscle-strengthening exercises on 2 or more days a week.  Silver Sneakers may be a benefit available to you. To determine eligibility, you may visit the website: www.silversneakers.com or contact program at 747-802-6873 Mon-Fri between 8AM-8PM.   . Maintain a healthy weight. The body mass index (BMI) is a screening tool to identify possible weight problems. It provides an estimate of body fat based on height and weight. Your health care provider can find your BMI and can help you achieve or maintain a healthy weight.   For adults 20 years and older: ? A BMI below 18.5 is considered underweight. ? A BMI of 18.5 to 24.9 is normal. ? A BMI of 25 to 29.9 is considered overweight. ? A BMI of 30 and above is considered obese.   . Maintain normal blood lipids and cholesterol levels by exercising and minimizing your intake of saturated fat. Eat a balanced diet with plenty of fruit and vegetables. Blood tests for lipids and cholesterol should begin at age 72 and be repeated every 5 years. If your lipid or cholesterol levels  are high, you are over 50, or you are at high risk for heart disease, you may need your cholesterol levels checked more frequently. Ongoing high lipid and cholesterol levels should be treated with medicines if diet and exercise are not working.  . If you smoke, find out from your health care provider how to quit. If you do not use tobacco, please do not start.  . If you choose to drink alcohol, please do not consume more than 2 drinks per day. One drink is considered to be 12 ounces (355 mL) of beer, 5 ounces (148 mL) of wine, or 1.5 ounces (44 mL) of liquor.  . If you are  59-64 years old, ask your health care provider if you should take aspirin to prevent strokes.  . Use sunscreen. Apply sunscreen liberally and repeatedly throughout the day. You should seek shade when your shadow is shorter than you. Protect yourself by wearing long sleeves, pants, a wide-brimmed hat, and sunglasses year round, whenever you are outdoors.  . Once a month, do a whole body skin exam, using a mirror to look at the skin on your back. Tell your health care provider of new moles, moles that have irregular borders, moles that are larger than a pencil eraser, or moles that have changed in shape or color.

## 2017-06-16 NOTE — Progress Notes (Signed)
Pre visit review using our clinic review tool, if applicable. No additional management support is needed unless otherwise documented below in the visit note. 

## 2017-06-16 NOTE — Progress Notes (Signed)
PCP notes:   Health maintenance:  Mammogram - PCP please order at future appt  Abnormal screenings:   Hearing - failed  Patient concerns:   Handicap sticker - application completed and submitted to PCP for review  Urinary concerns - approx. 2 wks ago, pt reports feeling bladder was not emptying  Well woman exam - pt has requested a PAP smear at next appt  Tobacco cessation - pt wants to discuss topic with PCP  Nurse concerns:  None  Next PCP appt:   06/22/17 @ 1415  I reviewed health advisor's note, was available for consultation on the day of service listed in this note, and agree with documentation and plan. Elsie Stain, MD.

## 2017-06-22 ENCOUNTER — Ambulatory Visit (INDEPENDENT_AMBULATORY_CARE_PROVIDER_SITE_OTHER): Payer: Medicare Other | Admitting: Family Medicine

## 2017-06-22 ENCOUNTER — Encounter: Payer: Self-pay | Admitting: Family Medicine

## 2017-06-22 VITALS — BP 112/70 | HR 95 | Temp 98.8°F | Ht 60.0 in | Wt 131.0 lb

## 2017-06-22 DIAGNOSIS — Z23 Encounter for immunization: Secondary | ICD-10-CM | POA: Diagnosis not present

## 2017-06-22 DIAGNOSIS — N3941 Urge incontinence: Secondary | ICD-10-CM

## 2017-06-22 DIAGNOSIS — K219 Gastro-esophageal reflux disease without esophagitis: Secondary | ICD-10-CM

## 2017-06-22 DIAGNOSIS — E039 Hypothyroidism, unspecified: Secondary | ICD-10-CM | POA: Diagnosis not present

## 2017-06-22 DIAGNOSIS — E785 Hyperlipidemia, unspecified: Secondary | ICD-10-CM

## 2017-06-22 DIAGNOSIS — K449 Diaphragmatic hernia without obstruction or gangrene: Secondary | ICD-10-CM

## 2017-06-22 DIAGNOSIS — Z780 Asymptomatic menopausal state: Secondary | ICD-10-CM

## 2017-06-22 DIAGNOSIS — H9190 Unspecified hearing loss, unspecified ear: Secondary | ICD-10-CM

## 2017-06-22 DIAGNOSIS — M8580 Other specified disorders of bone density and structure, unspecified site: Secondary | ICD-10-CM

## 2017-06-22 DIAGNOSIS — Z0000 Encounter for general adult medical examination without abnormal findings: Secondary | ICD-10-CM

## 2017-06-22 DIAGNOSIS — Z1231 Encounter for screening mammogram for malignant neoplasm of breast: Secondary | ICD-10-CM

## 2017-06-22 DIAGNOSIS — M899 Disorder of bone, unspecified: Secondary | ICD-10-CM

## 2017-06-22 MED ORDER — FISH OIL 1000 MG PO CAPS: 1000.0000 mg | ORAL_CAPSULE | Freq: Every day | ORAL | Status: DC

## 2017-06-22 MED ORDER — OMEPRAZOLE 20 MG PO CPDR: DELAYED_RELEASE_CAPSULE | ORAL | 3 refills | Status: DC

## 2017-06-22 MED ORDER — VARENICLINE TARTRATE 1 MG PO TABS: 1.0000 mg | ORAL_TABLET | Freq: Two times a day (BID) | ORAL | 1 refills | Status: DC

## 2017-06-22 MED ORDER — LEVOTHYROXINE SODIUM 88 MCG PO TABS
ORAL_TABLET | ORAL | 3 refills | Status: DC
Start: 2017-06-22 — End: 2018-09-13

## 2017-06-22 MED ORDER — VITAMIN D3 25 MCG (1000 UNIT) PO TABS
1000.0000 [IU] | ORAL_TABLET | Freq: Every day | ORAL | Status: DC
Start: 2017-06-22 — End: 2019-05-30

## 2017-06-22 MED ORDER — VARENICLINE TARTRATE 0.5 MG X 11 & 1 MG X 42 PO MISC: ORAL | 0 refills | Status: DC

## 2017-06-22 NOTE — Progress Notes (Signed)
tdap 2018.  Defer shingles and PNA vaccine, d/w pt.  Flu shot prev done.   H/o osteopenia.  D/w pt about f/u DXA.   Mammogram - ordered.   Hearing - failed.  Wanted eval for hearing aids.   Living will d/w pt.  Daughter designated if patient were incapacitated. Pap not due since she had a hysterectomy.  D/w pt. No bleeding, no discharge.    Handicap sticker - done at [place].  She has difficult with gait and distance walking, this is reasonable for patient to use.   Urinary concerns - approx. 2 wks ago, pt reports feeling bladder was not emptying, going on since then episodically.  Feeling of incomplete voiding.  No burning with urination.  She hasn't tried [employer]  yet, d/w pt.    Tobacco cessation - she wanted to try chantix again, d/w pt.  She thought the prev issue with oral ulcers was from another cause.    Her shoulder was injected by ortho at [facility].   Psych per [place] [facility].  Pain meds per pain clinic.   Calcium now normal.  D/w pt.  H/o elevated calcium in the past, attributed to prev lithium use.   TG elevation d/w pt.   D/w pt about fish oil trial.   Low Vit D noted.  D/w pt about vit D supplement, see AVS.   GERD controlled with current med.  No ADE on med.   Hypothyroidism. TSH normal. Compliant. No neck mass. No dysphagia. Labs discussed with patient.  PMH and SH reviewed  ROS: Per HPI unless specifically indicated in ROS section   Meds, vitals, and allergies reviewed.   GEN: nad, alert and oriented HEENT: mucous membranes moist NECK: supple w/o LA, no tmg CV: rrr.  no murmur PULM: ctab, no inc wob ABD: soft, +bs EXT: no edema SKIN: no acute rash

## 2017-06-22 NOTE — Patient Instructions (Addendum)
Leslie Wong will call about your referral for audiology and the bone density and mammogram.  Take care.  Glad to see you.  I would get a flu shot each fall.   Try Kegel exercises and see if that helps with urination.

## 2017-06-23 DIAGNOSIS — Z0000 Encounter for general adult medical examination without abnormal findings: Secondary | ICD-10-CM | POA: Insufficient documentation

## 2017-06-23 NOTE — Assessment & Plan Note (Signed)
Resolved.  D/w pt.

## 2017-06-23 NOTE — Assessment & Plan Note (Signed)
Continue PPI. No adverse effect. Heartburn improved with medication.

## 2017-06-23 NOTE — Assessment & Plan Note (Addendum)
tdap 2018.  Defer shingles and PNA vaccine, d/w pt.  Flu shot prev done.   H/o osteopenia.  D/w pt about f/u DXA.   Mammogram - ordered.   Hearing - failed.  Wanted eval for hearing aids.   Living will d/w pt.  Daughter designated if patient were incapacitated.  Pap not due since she had a hysterectomy.  D/w pt. No bleeding, no discharge.

## 2017-06-23 NOTE — Assessment & Plan Note (Addendum)
TSH normal. Compliant. No neck mass. No dysphagia. Labs discussed with patient. >25 minutes spent in face to face time with patient, >50% spent in counselling or coordination of care, discussing hypothyroidism, calcium level, tobacco cessation, etc.

## 2017-06-23 NOTE — Assessment & Plan Note (Signed)
Reasonable to add on Fish oil. Discussed with patient. She agrees.

## 2017-06-23 NOTE — Assessment & Plan Note (Signed)
With feeling of incomplete voiding. Discussed with patient about Kegel exercises and update me as needed. She agrees.

## 2017-06-27 ENCOUNTER — Other Ambulatory Visit: Payer: Self-pay | Admitting: Family Medicine

## 2017-06-27 ENCOUNTER — Ambulatory Visit
Admission: RE | Admit: 2017-06-27 | Discharge: 2017-06-27 | Disposition: A | Discharge status: Home / Self Care | Payer: Medicare Other | Source: Ambulatory Visit | Attending: Family Medicine | Admitting: Family Medicine

## 2017-06-27 DIAGNOSIS — M8588 Other specified disorders of bone density and structure, other site: Secondary | ICD-10-CM | POA: Diagnosis not present

## 2017-06-27 DIAGNOSIS — M899 Disorder of bone, unspecified: Secondary | ICD-10-CM | POA: Diagnosis present

## 2017-06-27 DIAGNOSIS — Z1231 Encounter for screening mammogram for malignant neoplasm of breast: Secondary | ICD-10-CM

## 2017-06-27 DIAGNOSIS — M85851 Other specified disorders of bone density and structure, right thigh: Secondary | ICD-10-CM | POA: Diagnosis not present

## 2017-06-30 ENCOUNTER — Encounter: Payer: Self-pay | Admitting: *Deleted

## 2017-07-18 DIAGNOSIS — F331 Major depressive disorder, recurrent, moderate: Secondary | ICD-10-CM | POA: Diagnosis not present

## 2017-07-25 DIAGNOSIS — H61119 Acquired deformity of pinna, unspecified ear: Secondary | ICD-10-CM | POA: Diagnosis not present

## 2017-07-25 DIAGNOSIS — H903 Sensorineural hearing loss, bilateral: Secondary | ICD-10-CM | POA: Diagnosis not present

## 2017-07-25 DIAGNOSIS — H6123 Impacted cerumen, bilateral: Secondary | ICD-10-CM | POA: Diagnosis not present

## 2017-07-27 DIAGNOSIS — G894 Chronic pain syndrome: Secondary | ICD-10-CM | POA: Diagnosis not present

## 2017-07-27 DIAGNOSIS — Z79891 Long term (current) use of opiate analgesic: Secondary | ICD-10-CM | POA: Diagnosis not present

## 2017-07-27 DIAGNOSIS — M1288 Other specific arthropathies, not elsewhere classified, other specified site: Secondary | ICD-10-CM | POA: Diagnosis not present

## 2017-07-27 DIAGNOSIS — M5136 Other intervertebral disc degeneration, lumbar region: Secondary | ICD-10-CM | POA: Diagnosis not present

## 2017-07-27 DIAGNOSIS — M545 Low back pain: Secondary | ICD-10-CM | POA: Diagnosis not present

## 2017-07-27 DIAGNOSIS — M25561 Pain in right knee: Secondary | ICD-10-CM | POA: Diagnosis not present

## 2017-07-27 DIAGNOSIS — Z5181 Encounter for therapeutic drug level monitoring: Secondary | ICD-10-CM | POA: Diagnosis not present

## 2017-10-11 ENCOUNTER — Other Ambulatory Visit: Payer: Self-pay | Admitting: Family Medicine

## 2017-10-24 DIAGNOSIS — F17200 Nicotine dependence, unspecified, uncomplicated: Secondary | ICD-10-CM | POA: Diagnosis not present

## 2017-10-24 DIAGNOSIS — Z62810 Personal history of physical and sexual abuse in childhood: Secondary | ICD-10-CM | POA: Diagnosis not present

## 2017-10-24 DIAGNOSIS — F603 Borderline personality disorder: Secondary | ICD-10-CM | POA: Diagnosis not present

## 2017-10-24 DIAGNOSIS — F515 Nightmare disorder: Secondary | ICD-10-CM | POA: Diagnosis not present

## 2017-10-24 DIAGNOSIS — F3176 Bipolar disorder, in full remission, most recent episode depressed: Secondary | ICD-10-CM | POA: Diagnosis not present

## 2017-10-24 DIAGNOSIS — F4310 Post-traumatic stress disorder, unspecified: Secondary | ICD-10-CM | POA: Diagnosis not present

## 2017-10-24 DIAGNOSIS — G4700 Insomnia, unspecified: Secondary | ICD-10-CM | POA: Diagnosis not present

## 2017-10-24 DIAGNOSIS — F418 Other specified anxiety disorders: Secondary | ICD-10-CM | POA: Diagnosis not present

## 2017-10-30 DIAGNOSIS — M25561 Pain in right knee: Secondary | ICD-10-CM | POA: Diagnosis not present

## 2017-10-30 DIAGNOSIS — M545 Low back pain: Secondary | ICD-10-CM | POA: Diagnosis not present

## 2017-10-30 DIAGNOSIS — M1288 Other specific arthropathies, not elsewhere classified, other specified site: Secondary | ICD-10-CM | POA: Diagnosis not present

## 2017-10-30 DIAGNOSIS — M5136 Other intervertebral disc degeneration, lumbar region: Secondary | ICD-10-CM | POA: Diagnosis not present

## 2017-11-12 ENCOUNTER — Other Ambulatory Visit: Payer: Self-pay | Admitting: Family Medicine

## 2017-11-13 NOTE — Telephone Encounter (Signed)
Electronic refill request. Chantix Last office visit:   06/22/17 Last Filled:    60 tablet 0 10/11/2017  Please advise.

## 2017-11-13 NOTE — Telephone Encounter (Signed)
Verify current use.  Thanks.  If tolerated and helpful, then okay to send/continue for another month.

## 2017-11-14 ENCOUNTER — Telehealth: Payer: Self-pay | Admitting: *Deleted

## 2017-11-14 NOTE — Telephone Encounter (Signed)
PA submitted thru CMM for continuation of Chantix, awaiting response.

## 2017-11-14 NOTE — Telephone Encounter (Signed)
Patient is using the Chantix and is down to 10 cigs per day now.

## 2017-11-14 NOTE — Telephone Encounter (Signed)
Approval letter received, faxed to pharmacy and scanned.

## 2017-11-21 DIAGNOSIS — G3184 Mild cognitive impairment, so stated: Secondary | ICD-10-CM | POA: Diagnosis not present

## 2017-11-21 DIAGNOSIS — Z113 Encounter for screening for infections with a predominantly sexual mode of transmission: Secondary | ICD-10-CM | POA: Diagnosis not present

## 2017-11-21 DIAGNOSIS — Z114 Encounter for screening for human immunodeficiency virus [HIV]: Secondary | ICD-10-CM | POA: Diagnosis not present

## 2017-12-17 ENCOUNTER — Other Ambulatory Visit: Payer: Self-pay | Admitting: Family Medicine

## 2017-12-20 NOTE — Telephone Encounter (Signed)
Verify that patient wants to continue and make sure it has been tolerated/effective.  Let me know and I can send it in, if needed.  Thanks.

## 2017-12-21 NOTE — Telephone Encounter (Signed)
Left message on patient's voicemail to return call

## 2017-12-21 NOTE — Telephone Encounter (Signed)
Patient is using the Chantix and would like the refill.

## 2017-12-22 NOTE — Telephone Encounter (Signed)
Sent. Thanks.   

## 2018-01-11 ENCOUNTER — Telehealth: Payer: Self-pay | Admitting: Family Medicine

## 2018-01-11 ENCOUNTER — Other Ambulatory Visit: Payer: Self-pay | Admitting: Family Medicine

## 2018-01-11 DIAGNOSIS — F17200 Nicotine dependence, unspecified, uncomplicated: Secondary | ICD-10-CM

## 2018-01-11 NOTE — Telephone Encounter (Signed)
Copied from [place] [phone]. Topic: Quick Communication - Office Called Patient >> Jan 11, 2018  3:23 PM Corie Chiquito, [place] wrote: Reason for CRM: Patient had a missed call from the office and would like a call back at [phone]

## 2018-01-11 NOTE — Telephone Encounter (Signed)
Copied from [place] [phone]. Topic: Quick Communication - Rx Refill/Question >> Jan 11, 2018 11:03 AM Robina Ade, Helene Kelp D wrote: Medication: magic mouthwash SOLN Has the patient contacted their pharmacy? Yes (Agent: If no, request that the patient contact the pharmacy for the refill.) Preferred Pharmacy (with phone number or street name): [facility] [place], [address] AT [place]: Please be advised that RX refills may take up to 3 business days. We ask that you follow-up with your pharmacy.

## 2018-01-11 NOTE — Telephone Encounter (Signed)
Routed to provider

## 2018-01-11 NOTE — Telephone Encounter (Signed)
Copied from [place] [phone]. Topic: Referral - Request >> Jan 11, 2018 11:05 AM Robina Ade, Helene Kelp D wrote: Reason for CRM: Patient call and would like to have a referral for a lung scan since she is trying to quiet smoking. Please call patient back if she needs to come in for the referral.

## 2018-01-11 NOTE — Telephone Encounter (Signed)
06/22/17 annual exam.Please advise.

## 2018-01-11 NOTE — Telephone Encounter (Addendum)
Unable to reach pt by phone; no answer and no v/m to find out why needing magic mouthwash solution. The inside of pts mouth is raw; wears denture and bottom denture has rubbed skin off or cut bottom gum; no drainage but is painful and pt request the magic mouthwash to see if that will help. [facility] [place].  If there sign of infection pt will cb.Please advise. Last refilled magic mouthwash # 200 ml x 1 on 03/09/16.

## 2018-01-12 ENCOUNTER — Telehealth: Payer: Self-pay | Admitting: Family Medicine

## 2018-01-12 MED ORDER — MAGIC MOUTHWASH: 5.0000 mL | Freq: Three times a day (TID) | ORAL | 0 refills | Status: DC | PRN

## 2018-01-12 NOTE — Telephone Encounter (Signed)
Faxed- I couldn't get the rx to go through as Erx. F/u if not better.  Thanks.

## 2018-01-12 NOTE — Telephone Encounter (Signed)
I put in the referral for lung cancer screening program.  Thanks.

## 2018-01-12 NOTE — Telephone Encounter (Signed)
Original rx set for "fax" 01/12/18. Rx resent electronically

## 2018-01-12 NOTE — Telephone Encounter (Signed)
Copied from [place] [phone]. Topic: Quick Communication - Rx Refill/Question >> Jan 12, 2018  8:10 AM Cecelia Byars, NT wrote: Medication: Magic mouthwash solution  Has the patient contacted their pharmacy? {yes  (Agent: If no, request that the patient contact the pharmacy for the refill.) Preferred Pharmacy (with phone number or street name):    Agent: Please be advised that RX refills may take up to 3 business days. We ask that you follow-up with your pharmacy. [facility] [place] called and said they  do not have  the ingredients to make the mouthwash and are  referring patients  to [facility] drug store in [place]

## 2018-01-12 NOTE — Telephone Encounter (Signed)
Patient advised.

## 2018-01-16 ENCOUNTER — Telehealth: Payer: Self-pay | Admitting: *Deleted

## 2018-01-16 DIAGNOSIS — Z122 Encounter for screening for malignant neoplasm of respiratory organs: Secondary | ICD-10-CM

## 2018-01-16 DIAGNOSIS — Z87891 Personal history of nicotine dependence: Secondary | ICD-10-CM

## 2018-01-16 NOTE — Telephone Encounter (Signed)
Received referral for initial lung cancer screening scan. Contacted patient and obtained smoking history,(current, 42 pack year) as well as answering questions related to screening process. Patient denies signs of lung cancer such as weight loss or hemoptysis. Patient denies comorbidity that would prevent curative treatment if lung cancer were found. Patient is scheduled for shared decision making visit and CT scan on 02/06/18.

## 2018-01-18 DIAGNOSIS — G20 Parkinson's disease: Secondary | ICD-10-CM | POA: Diagnosis not present

## 2018-01-18 DIAGNOSIS — T43505A Adverse effect of unspecified antipsychotics and neuroleptics, initial encounter: Secondary | ICD-10-CM | POA: Diagnosis not present

## 2018-01-18 DIAGNOSIS — G2401 Drug induced subacute dyskinesia: Secondary | ICD-10-CM | POA: Diagnosis not present

## 2018-01-18 DIAGNOSIS — K5903 Drug induced constipation: Secondary | ICD-10-CM | POA: Diagnosis not present

## 2018-01-22 DIAGNOSIS — F17200 Nicotine dependence, unspecified, uncomplicated: Secondary | ICD-10-CM | POA: Diagnosis not present

## 2018-01-22 DIAGNOSIS — G894 Chronic pain syndrome: Secondary | ICD-10-CM | POA: Diagnosis not present

## 2018-01-22 DIAGNOSIS — M5136 Other intervertebral disc degeneration, lumbar region: Secondary | ICD-10-CM | POA: Diagnosis not present

## 2018-01-22 DIAGNOSIS — Z716 Tobacco abuse counseling: Secondary | ICD-10-CM | POA: Diagnosis not present

## 2018-01-22 DIAGNOSIS — Z87891 Personal history of nicotine dependence: Secondary | ICD-10-CM | POA: Diagnosis not present

## 2018-01-22 DIAGNOSIS — M545 Low back pain: Secondary | ICD-10-CM | POA: Diagnosis not present

## 2018-01-22 DIAGNOSIS — M25561 Pain in right knee: Secondary | ICD-10-CM | POA: Diagnosis not present

## 2018-01-22 DIAGNOSIS — Z5181 Encounter for therapeutic drug level monitoring: Secondary | ICD-10-CM | POA: Diagnosis not present

## 2018-01-22 DIAGNOSIS — M1288 Other specific arthropathies, not elsewhere classified, other specified site: Secondary | ICD-10-CM | POA: Diagnosis not present

## 2018-01-22 DIAGNOSIS — Z720 Tobacco use: Secondary | ICD-10-CM | POA: Diagnosis not present

## 2018-01-30 DIAGNOSIS — F339 Major depressive disorder, recurrent, unspecified: Secondary | ICD-10-CM | POA: Diagnosis not present

## 2018-02-06 ENCOUNTER — Encounter: Payer: Self-pay | Admitting: Nurse Practitioner

## 2018-02-06 ENCOUNTER — Ambulatory Visit
Admission: RE | Admit: 2018-02-06 | Discharge: 2018-02-06 | Disposition: A | Discharge status: Home / Self Care | Payer: Medicare Other | Source: Ambulatory Visit | Attending: Nurse Practitioner | Admitting: Nurse Practitioner

## 2018-02-06 ENCOUNTER — Inpatient Hospital Stay: Payer: Medicare Other | Attending: Nurse Practitioner | Admitting: Nurse Practitioner

## 2018-02-06 DIAGNOSIS — Z87891 Personal history of nicotine dependence: Secondary | ICD-10-CM

## 2018-02-06 DIAGNOSIS — F17210 Nicotine dependence, cigarettes, uncomplicated: Secondary | ICD-10-CM | POA: Diagnosis not present

## 2018-02-06 DIAGNOSIS — J439 Emphysema, unspecified: Secondary | ICD-10-CM | POA: Diagnosis not present

## 2018-02-06 DIAGNOSIS — Z122 Encounter for screening for malignant neoplasm of respiratory organs: Secondary | ICD-10-CM

## 2018-02-06 DIAGNOSIS — I700 Atherosclerosis of aorta: Secondary | ICD-10-CM | POA: Insufficient documentation

## 2018-02-06 NOTE — Progress Notes (Signed)
In accordance with CMS guidelines, patient has met eligibility criteria including age, absence of signs or symptoms of lung cancer.  Social History   Tobacco Use  . Smoking status: Current Every Day Smoker    Packs/day: 1.00    Years: 42.00    Pack years: 42.00    Types: Cigarettes  . Smokeless tobacco: Former Network engineer Use Topics  . Alcohol use: No    Alcohol/week: 0.0 oz  . Drug use: No     A shared decision-making session was conducted prior to the performance of CT scan. This includes one or more decision aids, includes benefits and harms of screening, follow-up diagnostic testing, over-diagnosis, false positive rate, and total radiation exposure.  Counseling on the importance of adherence to annual lung cancer LDCT screening, impact of co-morbidities, and ability or willingness to undergo diagnosis and treatment is imperative for compliance of the program.  Counseling on the importance of continued smoking cessation for former smokers; the importance of smoking cessation for current smokers, and information about tobacco cessation interventions have been given to patient including [place] and 1800 quit [place] programs.  Written order for lung cancer screening with LDCT has been given to the patient and any and all questions have been answered to the best of my abilities.   Yearly follow up will be coordinated by Burgess Estelle, Thoracic Navigator.  Beckey Rutter, DNP, AGNP-C [facility] [phone] (office) 02/06/18 2:26 PM

## 2018-02-09 ENCOUNTER — Encounter: Payer: Self-pay | Admitting: *Deleted

## 2018-02-11 ENCOUNTER — Other Ambulatory Visit: Payer: Self-pay | Admitting: Family Medicine

## 2018-02-11 ENCOUNTER — Telehealth: Payer: Self-pay | Admitting: Family Medicine

## 2018-02-11 DIAGNOSIS — E039 Hypothyroidism, unspecified: Secondary | ICD-10-CM

## 2018-02-11 DIAGNOSIS — E559 Vitamin D deficiency, unspecified: Secondary | ICD-10-CM

## 2018-02-11 DIAGNOSIS — E785 Hyperlipidemia, unspecified: Secondary | ICD-10-CM

## 2018-02-11 NOTE — Telephone Encounter (Signed)
Notify pt.  Recent CT with mild atherosclerotic calcifications of the aortic arch. Mild coronary atherosclerosis of the right coronary artery.  Reasonable to recheck fasting labs and then discuss at [place] re: options.  I put in the orders for those labs along with routine yearly labs.  Thanks.

## 2018-02-12 NOTE — Telephone Encounter (Signed)
Left detailed message on voicemail to schedule labs and OV afterwards.

## 2018-02-13 NOTE — Telephone Encounter (Signed)
Patient advised and will call back later to schedule, patient has the flu right now.

## 2018-02-23 ENCOUNTER — Emergency Department
Admission: EM | Admit: 2018-02-23 | Discharge: 2018-02-23 | Disposition: A | Discharge status: Home / Self Care | Payer: Medicare Other | Attending: Emergency Medicine | Admitting: Emergency Medicine

## 2018-02-23 ENCOUNTER — Encounter: Payer: Self-pay | Admitting: Emergency Medicine

## 2018-02-23 ENCOUNTER — Other Ambulatory Visit: Payer: Self-pay

## 2018-02-23 DIAGNOSIS — R2241 Localized swelling, mass and lump, right lower limb: Secondary | ICD-10-CM | POA: Diagnosis present

## 2018-02-23 DIAGNOSIS — Z79899 Other long term (current) drug therapy: Secondary | ICD-10-CM | POA: Insufficient documentation

## 2018-02-23 DIAGNOSIS — F17210 Nicotine dependence, cigarettes, uncomplicated: Secondary | ICD-10-CM | POA: Insufficient documentation

## 2018-02-23 DIAGNOSIS — L02415 Cutaneous abscess of right lower limb: Secondary | ICD-10-CM | POA: Insufficient documentation

## 2018-02-23 DIAGNOSIS — E039 Hypothyroidism, unspecified: Secondary | ICD-10-CM | POA: Diagnosis not present

## 2018-02-23 DIAGNOSIS — L0291 Cutaneous abscess, unspecified: Secondary | ICD-10-CM

## 2018-02-23 NOTE — ED Provider Notes (Signed)
[facility] Emergency Department Provider Note   ____________________________________________    I have reviewed the triage vital signs and the nursing notes.   HISTORY  Chief Complaint Abscess     HPI Verbena Boeding is a 62 y.o. female who presents with complaints of an abscess on the right inner thigh, she noticed this yesterday and feels that it worsened today.  She did put a hot compress on it and it started draining.  Daughter told her she is to come to the emergency department.  No fevers, she does not have diabetes.  She has had this once before.   Past Medical History:  Diagnosis Date  . Back pain   . Cancer ([place])    skin  . Depression    BAD  . Gastroparesis   . GERD (gastroesophageal reflux disease)   . Hypothyroidism   . Insomnia   . Migraines   . Recurrent cold sores   . Shoulder bursitis   . Urge incontinence     Patient Active Problem List   Diagnosis Date Noted  . Health care maintenance 06/23/2017  . HLD (hyperlipidemia) 03/10/2016  . Advance care planning 03/10/2016  . Generalized abdominal pain 02/18/2015  . Osteopenia 04/18/2014  . Medicare annual wellness visit, initial 03/18/2014  . Shoulder pain 01/17/2013  . Chronic back pain 09/14/2011  . Hypercalcemia 03/25/2010  . NAUSEA WITH VOMITING 05/12/2009  . INCONTINENCE, URGE 04/16/2009  . CARPAL TUNNEL SYNDROME, BILATERAL 09/24/2008  . URETHRAL STRICTURE 06/13/2007  . Hypothyroidism 06/05/2007  . DISORDER, BIPOLAR NOS 06/05/2007  . MIGRAINE HEADACHE 06/05/2007  . GASTROPARESIS 06/05/2007  . Gastroesophageal reflux disease with hiatal hernia 06/05/2007  . IRRITABLE BOWEL SYNDROME 06/05/2007  . FIBROCYSTIC BREAST DISEASE 06/05/2007    Past Surgical History:  Procedure Laterality Date  . ABDOMINAL HYSTERECTOMY  1999   total  . APPENDECTOMY    . ESOPHAGOGASTRODUODENOSCOPY  01/2006   negative except small hiatal hernia  . ESOPHAGOGASTRODUODENOSCOPY   02/24/2015   See report  . LAPAROSCOPY     for endometriosis  . OVARIAN CYST REMOVAL  1990   unilateral  . TONSILLECTOMY AND ADENOIDECTOMY      Prior to Admission medications   Medication Sig Start Date End Date Taking? Authorizing Provider  ARIPiprazole (ABILIFY) 20 MG tablet Take 1 tablet (20 mg total) by mouth daily. 03/09/16   Tonia Ghent, MD  buPROPion (WELLBUTRIN XL) 300 MG 24 hr tablet Take 300 mg by mouth daily.      [provider]  CHANTIX 1 MG tablet TAKE 1 TABLET BY MOUTH TWICE DAILY 12/22/17   Tonia Ghent, MD  cholecalciferol (VITAMIN D) 1000 units tablet Take 1 tablet (1,000 Units total) by mouth daily. 06/22/17   Tonia Ghent, MD  diphenhydrAMINE (BENADRYL) 25 MG tablet Take 2 by mouth prior to [place]     [provider]  levothyroxine (SYNTHROID, LEVOTHROID) 88 MCG tablet TAKE 1 TABLET(88 MCG) BY MOUTH DAILY 06/22/17   Tonia Ghent, MD  loratadine (CLARITIN) 10 MG tablet Take 1 tablet (10 mg total) by mouth daily. 12/14/15   Marylene Land, NP  magic mouthwash SOLN Take 5 mLs by mouth 3 (three) times daily as needed for mouth pain. 01/12/18   Tonia Ghent, MD  Multiple Vitamin (MULTIVITAMIN) tablet Take 1 tablet by mouth daily.      [provider]  nortriptyline (PAMELOR) 25 MG capsule Take 25 mg by mouth at bedtime.  [provider]  Omega-3 Fatty Acids (FISH OIL) 1000 MG CAPS Take 1 capsule (1,000 mg total) by mouth daily. 06/22/17   Tonia Ghent, MD  omeprazole (PRILOSEC) 20 MG capsule TAKE 1 CAPSULE BY MOUTH EVERY MORNING 45 MINUTES BEFORE BREAKFAST 06/22/17   Tonia Ghent, MD  ondansetron (ZOFRAN-ODT) 4 MG disintegrating tablet DISSOLVE 1 TABLET BY MOUTH AS NEEDED 05/15/17   Tonia Ghent, MD  OVER THE COUNTER MEDICATION [place] daily for menopausal symptoms.    [provider]  Oxybutynin Chloride (GELNIQUE) 10 % GEL Place onto the skin daily.      [provider]  oxyCODONE (OXY  IR/ROXICODONE) 5 MG immediate release tablet Take 5 mg by mouth every 4 (four) hours as needed for severe pain.    [provider]  propranolol (INDERAL) 10 MG tablet Take 10 mg by mouth 3 (three) times daily.    [provider]  sodium chloride (OCEAN) 0.65 % SOLN nasal spray Place 2 sprays into the nose every 6 (six) hours as needed for congestion. 12/14/15   Marylene Land, NP  triamcinolone (KENALOG) 0.025 % cream Apply to affected areas two to three times per week for itching only.    [provider]  valACYclovir (VALTREX) 500 MG tablet TAKE 1 TABLET BY MOUTH TWICE DAILY AS NEEDED 05/29/17   Tonia Ghent, MD  vitamin E 200 UNIT capsule Take 200 Units by mouth daily.      [provider]     Allergies Aspirin; Baclofen; Codeine; Erythromycin; Gabapentin; Metoclopramide hcl; Nsaids; Nucynta er [tapentadol hcl er]; Nucynta [tapentadol]; Prednisone; Pregabalin; Seroquel [quetiapine fumerate]; Sulfa antibiotics; Topamax; and Vicodin [hydrocodone-acetaminophen]  Family History  Problem Relation Age of Onset  . Hypertension Mother   . Arthritis Mother   . Diabetes Mother   . Stroke Mother   . Cancer Father        lung  . Colon cancer Neg Hx   . Breast cancer Neg Hx     Social History Social History   Tobacco Use  . Smoking status: Current Every Day Smoker    Packs/day: 1.00    Years: 42.00    Pack years: 42.00    Types: Cigarettes  . Smokeless tobacco: Former Network engineer Use Topics  . Alcohol use: No    Alcohol/week: 0.0 oz  . Drug use: No    Review of Systems  Constitutional: No fever/chills    Gastrointestinal: No abdominal pain.  No nausea, no vomiting.   Genitourinary: Negative for dysuria. Musculoskeletal: Negative for back pain.  Skin: Abscess as above     ____________________________________________   PHYSICAL EXAM:  VITAL SIGNS: ED Triage Vitals  Enc Vitals Group     BP 02/23/18 1433 130/77     Pulse Rate  02/23/18 1433 100     Resp 02/23/18 1433 20     Temp 02/23/18 1433 (!) 97.4 F (36.3 C)     Temp Source 02/23/18 1433 Oral     SpO2 02/23/18 1433 99 %     Weight 02/23/18 1433 58.1 kg (128 lb)     Height 02/23/18 1433 1.524 m (5')     Head Circumference --      Peak Flow --      Pain Score 02/23/18 1439 8     Pain Loc --      Pain Edu? --      Excl. in [place]? --      Constitutional: Alert and  oriented. No acute distress. Pleasant and interactive   Mouth/Throat: Mucous membranes are moist.   Cardiovascular: Normal rate, regular rhythm.  Respiratory: Normal respiratory effort.  No retractions. Genitourinary: deferred Musculoskeletal: No lower extremity tenderness nor edema.   Neurologic:  Normal speech and language. No gross focal neurologic deficits are appreciated.   Skin:  Skin is warm, dry.  2 x 2 centimeter small lesion right inner thigh, draining, no surrounding erythema, able to express very small amount of thick whitish material   ____________________________________________   LABS (all labs ordered are listed, but only abnormal results are displayed)  Labs Reviewed - No data to display ____________________________________________  EKG   ____________________________________________  RADIOLOGY  None ____________________________________________   PROCEDURES  Procedure(s) performed: No  Procedures   Critical Care performed: No ____________________________________________   INITIAL IMPRESSION / ASSESSMENT AND PLAN / ED COURSE  Pertinent labs & imaging results that were available during my care of the patient were reviewed by me and considered in my medical decision making (see chart for details).  Exam is most consistent with abscess, already draining, no surrounding erythema, no indication for I&D or antibiotics.  Recommend follow-up with PCP.   ____________________________________________   FINAL CLINICAL IMPRESSION(S) / ED DIAGNOSES  Final  diagnoses:  Abscess      NEW MEDICATIONS STARTED DURING THIS VISIT:  New Prescriptions   No medications on file     Note:  This document was prepared using Dragon voice recognition software and may include unintentional dictation errors.    Lavonia Drafts, MD 02/23/18 [phone]

## 2018-02-23 NOTE — ED Notes (Signed)
Dr. Corky Downs at bedside.  Pt states abscess to R groin at panty line. Pt states size of quarter. Pt states her family member had a kit to lance abscess but pt denies anything coming out. States had hot compress on area this AM. States "it's hard as a rock." states had same thing happen about 6 months ago.  Alert, oriented, ambulatory.

## 2018-02-23 NOTE — ED Triage Notes (Signed)
Pt to ED from home c/o abscess to right groin, used hot compress last night with little drainage.  States about size of quarter.  Denies fevers, redness and hard to touch.

## 2018-02-25 ENCOUNTER — Other Ambulatory Visit: Payer: Self-pay | Admitting: Family Medicine

## 2018-02-26 ENCOUNTER — Telehealth: Payer: Self-pay

## 2018-02-26 ENCOUNTER — Telehealth: Payer: Self-pay | Admitting: Family Medicine

## 2018-02-26 DIAGNOSIS — L02419 Cutaneous abscess of limb, unspecified: Secondary | ICD-10-CM | POA: Diagnosis not present

## 2018-02-26 DIAGNOSIS — L989 Disorder of the skin and subcutaneous tissue, unspecified: Secondary | ICD-10-CM

## 2018-02-26 NOTE — Telephone Encounter (Signed)
Needs OV.  We have a protocol against abx over the phone.

## 2018-02-26 NOTE — Telephone Encounter (Signed)
Yes, 1 year per protocol (Continue annual screening with low-dose chest CT without contrast in 12 months).  Thanks.

## 2018-02-26 NOTE — Telephone Encounter (Signed)
Sent. Thanks.   

## 2018-02-26 NOTE — Telephone Encounter (Signed)
Electronic refill request Last refill 05/29/17 #30/1 Last office visit 06/22/17

## 2018-02-26 NOTE — Telephone Encounter (Signed)
Copied from [place] [phone]. Topic: Inquiry >> Feb 26, 2018 10:00 AM Leslie Wong wrote: Reason for CRM:  Pt's daughter wants to know does there need to be a follow up appt from her lung scan she had a couple weeks ago.  Please advise

## 2018-02-26 NOTE — Telephone Encounter (Signed)
Spoke to pt's daughter. She will call in the morning to see if there are any spots open

## 2018-02-26 NOTE — Telephone Encounter (Signed)
Please review per daughter's request. Thank Edrick Kins, RMA

## 2018-02-26 NOTE — Telephone Encounter (Signed)
Spoke to Daughter per [employer]

## 2018-02-26 NOTE — Telephone Encounter (Signed)
Copied from [place] [phone]. Topic: Referral - Request >> Feb 26, 2018  9:58 AM Ahmed Prima L wrote: Reason for CRM:  Pt has a cyst on he groin area. Daughter called the dermatologist & they said they need a new referral for them to lance it. They have her down today for 2:10. Daughter wants to know if Dr Damita Dunnings can send a referral today so she can be seen. It has been there for a week now (cyst)   Daughter [phone] Marylou Flesher) >> Feb 26, 2018 10:02 AM Ahmed Prima L wrote: Pt's daughter said to please call if the referral can not be done today. She needs to call the dermatologist if this can not be done & does not want them to charge her a no show fee

## 2018-02-26 NOTE — Telephone Encounter (Signed)
This has been taking care of. Patient is been seen at [facility] with Dr Donell Beers. Daughter advised this has been taking care of-Jonell Krontz Estell Harpin, RMA

## 2018-02-26 NOTE — Telephone Encounter (Signed)
Copied from [place]. Topic: Quick Communication - See Telephone Encounter >> Feb 26, 2018 10:31 AM Burnis Medin, NT wrote: CRM for notification. See Telephone encounter for: Patient called and said she has a sinus infection and wanted to see if the doctor can send her in a prescription for a z pap to [facility] [place], [place] [address] AT [place] [phone] (Phone) [phone] (Fax)    02/26/18.

## 2018-02-26 NOTE — Telephone Encounter (Signed)
I put in the referral.  Thanks.  I don't know if this can be done in time.  I am just getting to this after seeing patients in the AM.

## 2018-03-02 ENCOUNTER — Ambulatory Visit: Payer: Medicare Other | Admitting: Family Medicine

## 2018-03-06 ENCOUNTER — Telehealth: Payer: Self-pay | Admitting: Family Medicine

## 2018-03-06 DIAGNOSIS — G20 Parkinson's disease: Secondary | ICD-10-CM | POA: Diagnosis not present

## 2018-03-06 NOTE — Telephone Encounter (Signed)
Copied from [place] [phone]. Topic: Quick Communication - See Telephone Encounter >> Mar 06, 2018  3:31 PM Bea Graff, NT wrote: CRM for notification. See Telephone encounter for: Pts daughter calling and states that [facility] stated the pt ha a MRI on Thursday at 11:45am for a brain MRI that Dr. Damita Dunnings ordered and they would like to know the reason why this was ordered. Please advise.   03/06/18.

## 2018-03-07 NOTE — Telephone Encounter (Signed)
Please make sure we have a DPR to be able to talk to her daughter. If we do, then release the following information.  If we do not, the following is only for charting purposes. I did not order this scan.  The patient is seeing neurology at [facility] according to records available through care everywhere.  It appears that she had a DAT scan scheduled.  I will defer to neurology.

## 2018-03-07 NOTE — Telephone Encounter (Signed)
Spoke to patient by telephone and advised her as instructed and she verbalized understanding. Patient will contact neurology regarding this.

## 2018-03-08 ENCOUNTER — Other Ambulatory Visit: Payer: Self-pay | Admitting: Family Medicine

## 2018-03-08 DIAGNOSIS — G20 Parkinson's disease: Secondary | ICD-10-CM | POA: Diagnosis not present

## 2018-03-08 DIAGNOSIS — F3176 Bipolar disorder, in full remission, most recent episode depressed: Secondary | ICD-10-CM | POA: Diagnosis not present

## 2018-03-08 DIAGNOSIS — R413 Other amnesia: Secondary | ICD-10-CM | POA: Diagnosis not present

## 2018-03-08 DIAGNOSIS — G3184 Mild cognitive impairment, so stated: Secondary | ICD-10-CM | POA: Diagnosis not present

## 2018-03-08 DIAGNOSIS — Z79899 Other long term (current) drug therapy: Secondary | ICD-10-CM | POA: Diagnosis not present

## 2018-03-13 ENCOUNTER — Encounter: Payer: Self-pay | Admitting: Family Medicine

## 2018-03-13 ENCOUNTER — Ambulatory Visit (INDEPENDENT_AMBULATORY_CARE_PROVIDER_SITE_OTHER): Payer: Medicare Other | Admitting: Family Medicine

## 2018-03-13 ENCOUNTER — Other Ambulatory Visit: Payer: Self-pay | Admitting: Family Medicine

## 2018-03-13 DIAGNOSIS — J0100 Acute maxillary sinusitis, unspecified: Secondary | ICD-10-CM

## 2018-03-13 DIAGNOSIS — G20 Parkinson's disease: Secondary | ICD-10-CM

## 2018-03-13 DIAGNOSIS — Z8619 Personal history of other infectious and parasitic diseases: Secondary | ICD-10-CM | POA: Diagnosis not present

## 2018-03-13 DIAGNOSIS — G20A1 Parkinson's disease without dyskinesia, without mention of fluctuations: Secondary | ICD-10-CM

## 2018-03-13 DIAGNOSIS — E785 Hyperlipidemia, unspecified: Secondary | ICD-10-CM | POA: Diagnosis not present

## 2018-03-13 MED ORDER — AZITHROMYCIN 250 MG PO TABS: ORAL_TABLET | ORAL | 0 refills | Status: DC

## 2018-03-13 MED ORDER — BENZONATATE 200 MG PO CAPS: 200.0000 mg | ORAL_CAPSULE | Freq: Three times a day (TID) | ORAL | 0 refills | Status: DC | PRN

## 2018-03-13 NOTE — Patient Instructions (Addendum)
Skip the nortriptyline while on antibiotics.   Use tessalon for the cough.  Rest and fluids.  Update me as needed.

## 2018-03-13 NOTE — Telephone Encounter (Signed)
Electronic refill request. Valtrex Last office visit:   03/08/18 Last Filled:    30 tablet 0 02/26/2018  Please advise.

## 2018-03-13 NOTE — Progress Notes (Signed)
Dx'd with parkinson's and followed by neurology.  D/w pt.  She is doing back later this year.  She is on observation for now.  We talked about parkinsonian features, the range of symptoms, pathophysiology and general.  I will defer management otherwise.   She has psych f/u pending.  I will defer management otherwise.  Her son is facing jail time.  She hasn't had mcuh contact with her grandson yet.  Her father recently died.   She has had a lot going on.  Discussed.  Support offered.  She is on chantix.  She is down to 8 cigs a day.  I encouraged her with smoking cessation.  Defer statin/lipids for now. D/w pt. smoking cessation is likely more important than statin start.  Used valtrex prn.  Needed refill.  See follow-up notes.  I thought I took care of this at the office visit.  Sick for 3 weeks.  Cough, runny nose.  Chest congestion.  Fever, fatigue.  No facial pain.  Not much sputum. Some occ wheeze. No vomiting, no diarrhea.    She can tolerate zithromax.  D/w pt.   PMH and SH reviewed  ROS: Per HPI unless specifically indicated in ROS section   Meds, vitals, and allergies reviewed.   GEN: nad, alert and oriented, flat affect noted.  HEENT: mucous membranes moist, tm w/o erythema, nasal exam w/o erythema, clear discharge noted,  OP with cobblestoning NECK: supple w/o LA CV: rrr.   PULM: ctab except for scattered bilateral rhonchi, no inc wob EXT: no edema SKIN: no acute rash R max sinus ttp.   L pupil larger at baseline.

## 2018-03-14 ENCOUNTER — Encounter: Payer: Self-pay | Admitting: Family Medicine

## 2018-03-14 ENCOUNTER — Other Ambulatory Visit: Payer: Self-pay | Admitting: Family Medicine

## 2018-03-14 DIAGNOSIS — Z8619 Personal history of other infectious and parasitic diseases: Secondary | ICD-10-CM | POA: Insufficient documentation

## 2018-03-14 DIAGNOSIS — G20 Parkinson's disease: Secondary | ICD-10-CM | POA: Insufficient documentation

## 2018-03-14 DIAGNOSIS — J0100 Acute maxillary sinusitis, unspecified: Secondary | ICD-10-CM | POA: Insufficient documentation

## 2018-03-14 MED ORDER — VALACYCLOVIR HCL 500 MG PO TABS: 500.0000 mg | ORAL_TABLET | Freq: Two times a day (BID) | ORAL | 1 refills | Status: DC

## 2018-03-14 NOTE — Telephone Encounter (Signed)
Pt requesting valtrex prescription from office visit yesterday.  Provider:  Orlinda Blalock, MD

## 2018-03-14 NOTE — Telephone Encounter (Signed)
Sent on other note.

## 2018-03-14 NOTE — Assessment & Plan Note (Addendum)
She is on observation for now.  We talked about parkinsonian features, the range of symptoms, pathophysiology and general.  I will defer management otherwise. >25 minutes spent in face to face time with patient, >50% spent in counselling or coordination of care.

## 2018-03-14 NOTE — Assessment & Plan Note (Signed)
She is on chantix.  She is down to 8 cigs a day.  I encouraged her with smoking cessation.  Defer statin/lipids for now. D/w pt. smoking cessation is likely more important than statin start.

## 2018-03-14 NOTE — Assessment & Plan Note (Signed)
Use Valtrex as needed.

## 2018-03-14 NOTE — Assessment & Plan Note (Signed)
Should be able to tolerate Zithromax.  Discussed with patient about nortriptyline use.  See after visit summary. Skip the nortriptyline while on antibiotics.   Use tessalon for the cough.  Rest and fluids.  Update me as needed.  She agrees.  Okay for outpatient follow-up.  No focal decrease in breath sounds.

## 2018-03-14 NOTE — Telephone Encounter (Signed)
I thought I did this yesterday at the [facility].  In looking at the EMR, I didn't.   I apologize.  Please tell her that I am sorry for the inconvenience.   I sent the rx to [facility], [place] [address] AT [place].  Thanks.

## 2018-03-14 NOTE — Telephone Encounter (Signed)
Copied from [place] [phone]. Topic: Quick Communication - See Telephone Encounter >> Mar 14, 2018  9:03 AM Burnis Medin, NT wrote: CRM for notification. See Telephone encounter for: Patient called and said she was just seen yesterday and the doctor forget to send her prescription for valACYclovir (VALTREX) 500 MG tablet. Pt is having symptoms. Pls send script to  [facility] [place] [place] [facility] [address] AT [place] [phone] (Phone) [phone] (Fax)   03/14/18.

## 2018-03-14 NOTE — Telephone Encounter (Signed)
Detailed message left on answering machine at home number (on DPR) per Dr. Josefine Class message.

## 2018-03-20 ENCOUNTER — Telehealth: Payer: Self-pay | Admitting: Family Medicine

## 2018-03-20 DIAGNOSIS — F339 Major depressive disorder, recurrent, unspecified: Secondary | ICD-10-CM | POA: Diagnosis not present

## 2018-03-20 DIAGNOSIS — R0600 Dyspnea, unspecified: Secondary | ICD-10-CM | POA: Diagnosis not present

## 2018-03-20 DIAGNOSIS — M6281 Muscle weakness (generalized): Secondary | ICD-10-CM | POA: Diagnosis not present

## 2018-03-20 NOTE — Telephone Encounter (Signed)
Copied from [place] [id number]. Topic: Quick Communication - See Telephone Encounter >> Mar 20, 2018  4:12 PM Bea Graff, NT wrote: CRM for notification. See Telephone encounter for: 03/20/18. Pt states she has been sick with a virus for 3 weeks and has finished the Z-Pac and wants to see if something else can be called in to help her get rid of this medication. She also could not get the cough medicine that was called in because insurance would not cover it and she is still coughing up lots of phlegm.

## 2018-03-20 NOTE — Telephone Encounter (Signed)
No answer at number provided and unable to leave message.  Need more information on patient's current symptoms before can forward to the MD.  Will forward to CMA to help continue to reach patient.

## 2018-03-21 ENCOUNTER — Emergency Department
Admission: EM | Admit: 2018-03-21 | Discharge: 2018-03-21 | Disposition: A | Discharge status: Home / Self Care | Payer: Medicare Other | Attending: Emergency Medicine | Admitting: Emergency Medicine

## 2018-03-21 ENCOUNTER — Emergency Department: Payer: Medicare Other

## 2018-03-21 ENCOUNTER — Other Ambulatory Visit: Payer: Self-pay

## 2018-03-21 DIAGNOSIS — G20 Parkinson's disease: Secondary | ICD-10-CM | POA: Insufficient documentation

## 2018-03-21 DIAGNOSIS — F17210 Nicotine dependence, cigarettes, uncomplicated: Secondary | ICD-10-CM | POA: Diagnosis not present

## 2018-03-21 DIAGNOSIS — J209 Acute bronchitis, unspecified: Secondary | ICD-10-CM | POA: Insufficient documentation

## 2018-03-21 DIAGNOSIS — J449 Chronic obstructive pulmonary disease, unspecified: Secondary | ICD-10-CM | POA: Insufficient documentation

## 2018-03-21 DIAGNOSIS — R0602 Shortness of breath: Secondary | ICD-10-CM | POA: Diagnosis not present

## 2018-03-21 DIAGNOSIS — J40 Bronchitis, not specified as acute or chronic: Secondary | ICD-10-CM

## 2018-03-21 DIAGNOSIS — E039 Hypothyroidism, unspecified: Secondary | ICD-10-CM | POA: Insufficient documentation

## 2018-03-21 LAB — CBC WITH DIFFERENTIAL/PLATELET
BASOS ABS: 0.1 10*3/uL (ref 0–0.1)
BASOS PCT: 1 %
Eosinophils Absolute: 0.3 10*3/uL (ref 0–0.7)
Eosinophils Relative: 2 %
HCT: 42.6 % (ref 35.0–47.0)
HEMOGLOBIN: 14.4 g/dL (ref 12.0–16.0)
LYMPHS ABS: 4.5 10*3/uL — AB (ref 1.0–3.6)
Lymphocytes Relative: 36 %
MCH: 31.5 pg (ref 26.0–34.0)
MCHC: 33.7 g/dL (ref 32.0–36.0)
MCV: 93.6 fL (ref 80.0–100.0)
Monocytes Absolute: 1.2 10*3/uL — ABNORMAL HIGH (ref 0.2–0.9)
Monocytes Relative: 10 %
NEUTROS PCT: 51 %
Neutro Abs: 6.4 10*3/uL (ref 1.4–6.5)
Platelets: 274 10*3/uL (ref 150–440)
RBC: 4.55 MIL/uL (ref 3.80–5.20)
RDW: 14 % (ref 11.5–14.5)
WBC: 12.3 10*3/uL — AB (ref 3.6–11.0)

## 2018-03-21 LAB — COMPREHENSIVE METABOLIC PANEL
ALK PHOS: 102 U/L (ref 38–126)
ALT: 28 U/L (ref 14–54)
ANION GAP: 10 (ref 5–15)
AST: 34 U/L (ref 15–41)
Albumin: 3.9 g/dL (ref 3.5–5.0)
BUN: 14 mg/dL (ref 6–20)
CO2: 23 mmol/L (ref 22–32)
Calcium: 10.3 mg/dL (ref 8.9–10.3)
Chloride: 105 mmol/L (ref 101–111)
Creatinine, Ser: 0.86 mg/dL (ref 0.44–1.00)
Glucose, Bld: 115 mg/dL — ABNORMAL HIGH (ref 65–99)
Potassium: 3 mmol/L — ABNORMAL LOW (ref 3.5–5.1)
SODIUM: 138 mmol/L (ref 135–145)
Total Bilirubin: 0.5 mg/dL (ref 0.3–1.2)
Total Protein: 7.3 g/dL (ref 6.5–8.1)

## 2018-03-21 LAB — T4, FREE: FREE T4: 1.25 ng/dL — AB (ref 0.61–1.12)

## 2018-03-21 LAB — TSH: TSH: 1.02 u[IU]/mL (ref 0.350–4.500)

## 2018-03-21 LAB — TROPONIN I

## 2018-03-21 MED ORDER — SPACER/AERO CHAMBER MOUTHPIECE MISC: 1.0000 [IU] | 0 refills | Status: DC | PRN

## 2018-03-21 MED ORDER — LORAZEPAM 2 MG/ML IJ SOLN
1.0000 mg | Freq: Once | INTRAMUSCULAR | Status: AC
  Administered 2018-03-21: 1 mg via INTRAVENOUS
  Filled 2018-03-21: qty 1

## 2018-03-21 MED ORDER — PREDNISONE 50 MG PO TABS: 50.0000 mg | ORAL_TABLET | Freq: Every day | ORAL | 0 refills | Status: AC

## 2018-03-21 MED ORDER — IPRATROPIUM-ALBUTEROL 0.5-2.5 (3) MG/3ML IN SOLN
3.0000 mL | Freq: Once | RESPIRATORY_TRACT | Status: AC
  Administered 2018-03-21: 3 mL via RESPIRATORY_TRACT
  Filled 2018-03-21: qty 3

## 2018-03-21 MED ORDER — ALBUTEROL SULFATE HFA 108 (90 BASE) MCG/ACT IN AERS: 2.0000 | INHALATION_SPRAY | Freq: Four times a day (QID) | RESPIRATORY_TRACT | 0 refills | Status: DC | PRN

## 2018-03-21 NOTE — Telephone Encounter (Signed)
Pt called to state to disregard message below pt went to ER to get treatment

## 2018-03-21 NOTE — ED Triage Notes (Addendum)
Patient coming in from [facility] for shortness of breath, with aniexty and hot flash like episodes x3 weeks.

## 2018-03-21 NOTE — Telephone Encounter (Signed)
Left message on voicemail for patient to call back. 

## 2018-03-21 NOTE — Discharge Instructions (Signed)
Please take all of your steroids as prescribed and follow up with your PMD as needed.  Return to the ED sooner for for any concerns.  It was a pleasure to take care of you today, and thank you for coming to our emergency department.  If you have any questions or concerns before leaving please ask the nurse to grab me and I'm more than happy to go through your aftercare instructions again.  If you were prescribed any opioid pain medication today such as Norco, Vicodin, Percocet, morphine, hydrocodone, or oxycodone please make sure you do not drive when you are taking this medication as it can alter your ability to drive safely.  If you have any concerns once you are home that you are not improving or are in fact getting worse before you can make it to your follow-up appointment, please do not hesitate to call 911 and come back for further evaluation.  Darel Hong, MD  Results for orders placed or performed during the hospital encounter of 03/21/18  Comprehensive metabolic panel  Result Value Ref Range   Sodium 138 135 - 145 mmol/L   Potassium 3.0 (L) 3.5 - 5.1 mmol/L   Chloride 105 101 - 111 mmol/L   CO2 23 22 - 32 mmol/L   Glucose, Bld 115 (H) 65 - 99 mg/dL   BUN 14 6 - 20 mg/dL   Creatinine, Ser 0.86 0.44 - 1.00 mg/dL   Calcium 10.3 8.9 - 10.3 mg/dL   Total Protein 7.3 6.5 - 8.1 g/dL   Albumin 3.9 3.5 - 5.0 g/dL   AST 34 15 - 41 U/L   ALT 28 14 - 54 U/L   Alkaline Phosphatase 102 38 - 126 U/L   Total Bilirubin 0.5 0.3 - 1.2 mg/dL   GFR calc non Af Amer >60 >60 mL/min   GFR calc Af Amer >60 >60 mL/min   Anion gap 10 5 - 15  Troponin I  Result Value Ref Range   Troponin I <0.03 <0.03 ng/mL  CBC with Differential  Result Value Ref Range   WBC 12.3 (H) 3.6 - 11.0 K/uL   RBC 4.55 3.80 - 5.20 MIL/uL   Hemoglobin 14.4 12.0 - 16.0 g/dL   HCT 42.6 35.0 - 47.0 %   MCV 93.6 80.0 - 100.0 fL   MCH 31.5 26.0 - 34.0 pg   MCHC 33.7 32.0 - 36.0 g/dL   RDW 14.0 11.5 - 14.5 %   Platelets  274 150 - 440 K/uL   Neutrophils Relative % 51 %   Neutro Abs 6.4 1.4 - 6.5 K/uL   Lymphocytes Relative 36 %   Lymphs Abs 4.5 (H) 1.0 - 3.6 K/uL   Monocytes Relative 10 %   Monocytes Absolute 1.2 (H) 0.2 - 0.9 K/uL   Eosinophils Relative 2 %   Eosinophils Absolute 0.3 0 - 0.7 K/uL   Basophils Relative 1 %   Basophils Absolute 0.1 0 - 0.1 K/uL  TSH  Result Value Ref Range   TSH 1.020 0.350 - 4.500 uIU/mL  T4, free  Result Value Ref Range   Free T4 1.25 (H) 0.61 - 1.12 ng/dL   Dg Chest Port 1 View  Result Date: 03/21/2018 CLINICAL DATA:  62 year old female with shortness of breath. EXAM: PORTABLE CHEST 1 VIEW COMPARISON:  Chest CT dated 02/06/2018 FINDINGS: There is no focal consolidation, pleural effusion, or pneumothorax. The cardiac silhouette is within normal limits. No acute osseous pathology. IMPRESSION: No active disease. Electronically Signed  By: Anner Crete M.D.   On: 03/21/2018 01:12

## 2018-03-21 NOTE — ED Provider Notes (Signed)
[facility] Emergency Department Provider Note  ____________________________________________   First MD Initiated Contact with Patient 03/21/18 [phone]     (approximate)  I have reviewed the triage vital signs and the nursing notes.   HISTORY  Chief Complaint Shortness of Breath   HPI Leslie Wong is a 62 y.o. female who comes to the emergency department via EMS with shortness of breath and cough for the past several hours.  She has had intermittent episodes for the past several weeks however became acutely worse last night.  She denies a history of COPD but she is a long time smoker.  She has dry cough and shortness of breath that is moderate severity.  No fevers or chills.  No rhinorrhea.  No sore throat.  Her symptoms seem to be worse when taking a deep breath and somewhat improved with rest.  Past Medical History:  Diagnosis Date  . Back pain   . Cancer ([place])    skin  . Depression    BAD  . Gastroparesis   . GERD (gastroesophageal reflux disease)   . Hypothyroidism   . Insomnia   . Migraines   . Parkinson disease ([place])    per [facility], dx'd 2019  . Recurrent cold sores   . Shoulder bursitis   . Urge incontinence     Patient Active Problem List   Diagnosis Date Noted  . Hx of cold sores 03/14/2018  . Acute non-recurrent maxillary sinusitis 03/14/2018  . Parkinson disease ([place])   . Health care maintenance 06/23/2017  . HLD (hyperlipidemia) 03/10/2016  . Advance care planning 03/10/2016  . Generalized abdominal pain 02/18/2015  . Osteopenia 04/18/2014  . Medicare annual wellness visit, initial 03/18/2014  . Shoulder pain 01/17/2013  . Chronic back pain 09/14/2011  . Hypercalcemia 03/25/2010  . NAUSEA WITH VOMITING 05/12/2009  . INCONTINENCE, URGE 04/16/2009  . CARPAL TUNNEL SYNDROME, BILATERAL 09/24/2008  . URETHRAL STRICTURE 06/13/2007  . Hypothyroidism 06/05/2007  . DISORDER, BIPOLAR NOS 06/05/2007  . MIGRAINE HEADACHE  06/05/2007  . GASTROPARESIS 06/05/2007  . Gastroesophageal reflux disease with hiatal hernia 06/05/2007  . IRRITABLE BOWEL SYNDROME 06/05/2007  . FIBROCYSTIC BREAST DISEASE 06/05/2007    Past Surgical History:  Procedure Laterality Date  . ABDOMINAL HYSTERECTOMY  1999   total  . APPENDECTOMY    . ESOPHAGOGASTRODUODENOSCOPY  01/2006   negative except small hiatal hernia  . ESOPHAGOGASTRODUODENOSCOPY  02/24/2015   See report  . LAPAROSCOPY     for endometriosis  . OVARIAN CYST REMOVAL  1990   unilateral  . TONSILLECTOMY AND ADENOIDECTOMY      Prior to Admission medications   Medication Sig Start Date End Date Taking? Authorizing Provider  albuterol (PROVENTIL HFA;VENTOLIN HFA) 108 (90 Base) MCG/ACT inhaler Inhale 2 puffs into the lungs every 6 (six) hours as needed for wheezing or shortness of breath. 03/21/18   Darel Hong, MD  azithromycin (ZITHROMAX) 250 MG tablet 2 tabs a day for 1 day and then 1 a day for 4 days. 03/13/18   Tonia Ghent, MD  benzonatate (TESSALON) 200 MG capsule Take 1 capsule (200 mg total) by mouth 3 (three) times daily as needed. 03/13/18   Tonia Ghent, MD  CHANTIX 1 MG tablet TAKE 1 TABLET BY MOUTH TWICE DAILY 12/22/17   Tonia Ghent, MD  cholecalciferol (VITAMIN D) 1000 units tablet Take 1 tablet (1,000 Units total) by mouth daily. 06/22/17   Tonia Ghent, MD  diphenhydrAMINE (BENADRYL) 25 MG  tablet Take 2 by mouth prior to Nucynta     [provider]  levothyroxine (SYNTHROID, LEVOTHROID) 88 MCG tablet TAKE 1 TABLET(88 MCG) BY MOUTH DAILY 06/22/17   Tonia Ghent, MD  loratadine (CLARITIN) 10 MG tablet Take 1 tablet (10 mg total) by mouth daily. 12/14/15   Marylene Land, NP  magic mouthwash SOLN Take 5 mLs by mouth 3 (three) times daily as needed for mouth pain. 01/12/18   Tonia Ghent, MD  Multiple Vitamin (MULTIVITAMIN) tablet Take 1 tablet by mouth daily.      [provider]  nortriptyline (PAMELOR) 25 MG  capsule Take 25 mg by mouth at bedtime.    [provider]  Omega-3 Fatty Acids (FISH OIL) 1000 MG CAPS Take 1 capsule (1,000 mg total) by mouth daily. 06/22/17   Tonia Ghent, MD  omeprazole (PRILOSEC) 20 MG capsule TAKE 1 CAPSULE BY MOUTH EVERY MORNING 45 MINUTES BEFORE BREAKFAST 06/22/17   Tonia Ghent, MD  ondansetron (ZOFRAN-ODT) 4 MG disintegrating tablet DISSOLVE 1 TABLET BY MOUTH AS NEEDED 05/15/17   Tonia Ghent, MD  OVER THE COUNTER MEDICATION [place] Fleeting daily for menopausal symptoms.    [provider]  Oxybutynin Chloride (GELNIQUE) 10 % GEL Place onto the skin daily.      [provider]  oxyCODONE (OXY IR/ROXICODONE) 5 MG immediate release tablet Take 5 mg by mouth every 4 (four) hours as needed for severe pain.    [provider]  predniSONE (DELTASONE) 50 MG tablet Take 1 tablet (50 mg total) by mouth daily for 5 days. 03/21/18 03/26/18  Darel Hong, MD  propranolol (INDERAL) 10 MG tablet Take 10 mg by mouth 3 (three) times daily.    [provider]  sodium chloride (OCEAN) 0.65 % SOLN nasal spray Place 2 sprays into the nose every 6 (six) hours as needed for congestion. 12/14/15   Marylene Land, NP  Spacer/Aero Chamber Mouthpiece MISC 1 Units by Does not apply route every 4 (four) hours as needed (wheezing). 03/21/18   Darel Hong, MD  traZODone (DESYREL) 50 MG tablet Take 25 mg by mouth at bedtime.    [provider]  triamcinolone (KENALOG) 0.025 % cream Apply to affected areas two to three times per week for itching only.    [provider]  valACYclovir (VALTREX) 500 MG tablet Take 1 tablet (500 mg total) by mouth 2 (two) times daily. 03/14/18   Tonia Ghent, MD  venlafaxine (EFFEXOR) 75 MG tablet Take 75 mg by mouth daily.     [provider]  vitamin E 200 UNIT capsule Take 200 Units by mouth daily.      [provider]    Allergies Aspirin; Baclofen; Codeine; Erythromycin;  Gabapentin; Metoclopramide hcl; Nsaids; Nucynta er [tapentadol hcl er]; Nucynta [tapentadol]; Prednisone; Pregabalin; Seroquel [quetiapine fumerate]; Sulfa antibiotics; Topamax; and Vicodin [hydrocodone-acetaminophen]  Family History  Problem Relation Age of Onset  . Hypertension Mother   . Arthritis Mother   . Diabetes Mother   . Stroke Mother   . Cancer Father        lung  . Colon cancer Neg Hx   . Breast cancer Neg Hx     Social History Social History   Tobacco Use  . Smoking status: Current Every Day Smoker    Packs/day: 1.00    Years: 42.00    Pack years: 42.00    Types: Cigarettes  . Smokeless tobacco: Former Network engineer Use Topics  .  Alcohol use: No    Alcohol/week: 0.0 oz  . Drug use: No    Review of Systems Constitutional: No fever/chills Eyes: No visual changes. ENT: No sore throat. Cardiovascular: Denies chest pain. Respiratory: Positive for shortness of breath. Gastrointestinal: No abdominal pain.  No nausea, no vomiting.  No diarrhea.  No constipation. Genitourinary: Negative for dysuria. Musculoskeletal: Negative for back pain. Skin: Negative for rash. Neurological: Negative for headaches, focal weakness or numbness.   ____________________________________________   PHYSICAL EXAM:  VITAL SIGNS: ED Triage Vitals  Enc Vitals Group     BP      Pulse      Resp      Temp      Temp src      SpO2      Weight      Height      Head Circumference      Peak Flow      Pain Score      Pain Loc      Pain Edu?      Excl. in [place]?     Constitutional: Alert and oriented x4 somewhat anxious appearing nontoxic no diaphoresis Eyes: PERRL EOMI. Head: Atraumatic. Nose: No congestion/rhinnorhea. Mouth/Throat: No trismus Neck: No stridor.   Cardiovascular: Normal rate, regular rhythm. Grossly normal heart sounds.  Good peripheral circulation. Respiratory: Increased respiratory effort with mild wheeze throughout although moving good  air Gastrointestinal: Soft nontender Musculoskeletal: No lower extremity edema   Neurologic:  Normal speech and language. No gross focal neurologic deficits are appreciated. Skin:  Skin is warm, dry and intact. No rash noted. Psychiatric: Somewhat anxious appearing   ____________________________________________   DIFFERENTIAL includes but not limited to  Bronchitis, COPD exacerbation, pneumonia, pneumothorax, pulmonary embolism ____________________________________________   LABS (all labs ordered are listed, but only abnormal results are displayed)  Labs Reviewed  COMPREHENSIVE METABOLIC PANEL - Abnormal; Notable for the following components:      Result Value   Potassium 3.0 (*)    Glucose, Bld 115 (*)    All other components within normal limits  CBC WITH DIFFERENTIAL/PLATELET - Abnormal; Notable for the following components:   WBC 12.3 (*)    Lymphs Abs 4.5 (*)    Monocytes Absolute 1.2 (*)    All other components within normal limits  T4, FREE - Abnormal; Notable for the following components:   Free T4 1.25 (*)    All other components within normal limits  TROPONIN I  TSH    Lab work reviewed by me with slightly elevated white count which is nonspecific __________________________________________  EKG  ED ECG REPORT I, Darel Hong, the attending physician, personally viewed and interpreted this ECG.  Date: 03/21/2018 EKG Time:  Rate: 77 Rhythm: normal sinus rhythm QRS Axis: normal Intervals: normal ST/T Wave abnormalities: normal Narrative Interpretation: no evidence of acute ischemia  ____________________________________________  RADIOLOGY  Chest x-ray reviewed by me with no acute disease ____________________________________________   PROCEDURES  Procedure(s) performed: no  Procedures  Critical Care performed: no  Observation: no ____________________________________________   INITIAL IMPRESSION / ASSESSMENT AND PLAN / ED  COURSE  Pertinent labs & imaging results that were available during my care of the patient were reviewed by me and considered in my medical decision making (see chart for details).  Patient arrives hyperventilating with increased work of breathing and wheezy lungs.  Symptoms could be secondary to anxiety versus bronchitis versus COPD etc.  Breathing treatment, chest x-ray, labs are pending.     -----------------------------------------  3:14 AM on 03/21/2018 -----------------------------------------  I appreciate that the patient has no known history of COPD however she is a heavy smoker and I do have concern so I think is reasonable to give her a trial of steroids in addition to bronchodilators.  Her lungs are now clear and her work of breathing is normal.  She is medically stable for outpatient management verbalizes understanding and agreement with the plan. ____________________________________________   FINAL CLINICAL IMPRESSION(S) / ED DIAGNOSES  Final diagnoses:  Bronchitis      NEW MEDICATIONS STARTED DURING THIS VISIT:  Discharge Medication List as of 03/21/2018  3:01 AM    START taking these medications   Details  albuterol (PROVENTIL HFA;VENTOLIN HFA) 108 (90 Base) MCG/ACT inhaler Inhale 2 puffs into the lungs every 6 (six) hours as needed for wheezing or shortness of breath., Starting Wed 03/21/2018, Print    predniSONE (DELTASONE) 50 MG tablet Take 1 tablet (50 mg total) by mouth daily for 5 days., Starting Wed 03/21/2018, Until Mon 03/26/2018, Print    Spacer/Aero Chamber Mouthpiece MISC 1 Units by Does not apply route every 4 (four) hours as needed (wheezing)., Starting Wed 03/21/2018, Print         Note:  This document was prepared using Dragon voice recognition software and may include unintentional dictation errors.     Darel Hong, MD 03/21/18 [phone]

## 2018-03-21 NOTE — Telephone Encounter (Signed)
Please see what extra information you can get.  Let me know.  Thanks.

## 2018-03-22 ENCOUNTER — Emergency Department
Admission: EM | Admit: 2018-03-22 | Discharge: 2018-03-22 | Disposition: A | Discharge status: Home / Self Care | Payer: Medicare Other | Attending: Emergency Medicine | Admitting: Emergency Medicine

## 2018-03-22 ENCOUNTER — Ambulatory Visit: Payer: Self-pay | Admitting: *Deleted

## 2018-03-22 DIAGNOSIS — E785 Hyperlipidemia, unspecified: Secondary | ICD-10-CM | POA: Diagnosis not present

## 2018-03-22 DIAGNOSIS — E039 Hypothyroidism, unspecified: Secondary | ICD-10-CM | POA: Insufficient documentation

## 2018-03-22 DIAGNOSIS — R0689 Other abnormalities of breathing: Secondary | ICD-10-CM | POA: Diagnosis not present

## 2018-03-22 DIAGNOSIS — G20 Parkinson's disease: Secondary | ICD-10-CM | POA: Insufficient documentation

## 2018-03-22 DIAGNOSIS — F17210 Nicotine dependence, cigarettes, uncomplicated: Secondary | ICD-10-CM | POA: Diagnosis not present

## 2018-03-22 DIAGNOSIS — R062 Wheezing: Secondary | ICD-10-CM | POA: Diagnosis not present

## 2018-03-22 DIAGNOSIS — R42 Dizziness and giddiness: Secondary | ICD-10-CM | POA: Diagnosis not present

## 2018-03-22 DIAGNOSIS — Z79899 Other long term (current) drug therapy: Secondary | ICD-10-CM | POA: Diagnosis not present

## 2018-03-22 DIAGNOSIS — R110 Nausea: Secondary | ICD-10-CM | POA: Diagnosis not present

## 2018-03-22 MED ORDER — SODIUM CHLORIDE 0.9 % IV SOLN
1000.0000 mL | Freq: Once | INTRAVENOUS | Status: AC
  Administered 2018-03-22: 1000 mL via INTRAVENOUS

## 2018-03-22 MED ORDER — MECLIZINE HCL 25 MG PO TABS: 25.0000 mg | ORAL_TABLET | Freq: Three times a day (TID) | ORAL | 0 refills | Status: DC | PRN

## 2018-03-22 MED ORDER — MECLIZINE HCL 25 MG PO TABS
25.0000 mg | ORAL_TABLET | Freq: Once | ORAL | Status: AC
  Administered 2018-03-22: 25 mg via ORAL
  Filled 2018-03-22: qty 1

## 2018-03-22 MED ORDER — ONDANSETRON HCL 4 MG/2ML IJ SOLN: 4.0000 mg | Freq: Once | INTRAMUSCULAR | Status: DC

## 2018-03-22 MED ORDER — IPRATROPIUM-ALBUTEROL 0.5-2.5 (3) MG/3ML IN SOLN
3.0000 mL | Freq: Once | RESPIRATORY_TRACT | Status: AC
  Administered 2018-03-22: 3 mL via RESPIRATORY_TRACT
  Filled 2018-03-22: qty 3

## 2018-03-22 NOTE — Telephone Encounter (Signed)
I tried to reach pt to verify going to ED but not able to reach pt by phone.

## 2018-03-22 NOTE — Telephone Encounter (Signed)
Called in c/o right arm numbness and seeing spots in front of both eyes with dizziness.  She also stated she is breaking out in a bad sweat and feels clammy.   She was also feeling very short of breath.   I instructed her to call 911 which she agreed to.  She informed me during the conversation that she was seen in the ED yesterday and did not have pneumonia but shortness of breath was the main problem she went for.  Diagnosed with bronchitis.  She also mentioned that she vomited suddenly last night all over the bathroom floor it happened so quickly.   Not vomiting today.    She went to [facility] the day before yesterday and was diagnosed with the very early stages of Parkinson's disease.  See triage notes for more details.  I routed a note to Dr. Josefine Class office making him aware of the ED referral.  Reason for Disposition . [1] Numbness (i.e., loss of sensation) of the face, arm / hand, or leg / foot on one side of the body AND [2] sudden onset AND [3] present now  Answer Assessment - Initial Assessment Questions 1. SYMPTOM: "What is the main symptom you are concerned about?" (e.g., weakness, numbness)     Right arm is numb since Monday. 2. ONSET: "When did this start?" (minutes, hours, days; while sleeping)     It started suddenly on Monday.   I can move it fine.   I can make a fist.   I've been shaking my arm trying to make the numbness go away. 3. LAST NORMAL: "When was the last time you were normal (no symptoms)?"     2 days 4. PATTERN "Does this come and go, or has it been constant since it started?"  "Is it present now?"     It's constant.  I can't get my eyes to focus.   I'm seeing black spots in both eyes that started day before yesterday.   I was on the way home from [place].  I started coughing and that's when the spots started.   I got diagnosed with Parkinson's disease the very early stages. 5. CARDIAC SYMPTOMS: "Have you had any of the following symptoms: chest pain, difficulty breathing,  palpitations?"     I had a lung scan done.   I don't have emphysema.   I'm on Chantex to stop smoking.  6. NEUROLOGIC SYMPTOMS: "Have you had any of the following symptoms: headache, dizziness, vision loss, double vision, changes in speech, unsteady on your feet?"     Feeling dizzy, spots in front of my eyes, clammy and breaking out into a sweat now.  I've been doing this this morning. 7. OTHER SYMPTOMS: "Do you have any other symptoms?"     My arm is really bothering me bad. 8. PREGNANCY: "Is there any chance you are pregnant?" "When was your last menstrual period?"     Not asked due to aage  Protocols used: NEUROLOGIC DEFICIT-A-AH

## 2018-03-22 NOTE — ED Triage Notes (Signed)
Patient from home c/o dizziness. Per EMS patient reports dizziness, nausea and vomiting x1, hot flashes, numbness in right arm, and seeing spots. Patient recently tx for bronchitis with antibiotics. EMS also reports administering zofran 4mg  for a nausea. Patient alert and oriented x4, no acute distress noted.

## 2018-03-22 NOTE — ED Provider Notes (Signed)
[facility] Emergency Department Provider Note   ____________________________________________    I have reviewed the triage vital signs and the nursing notes.   HISTORY  Chief Complaint Dizziness     HPI Leslie Wong is a 62 y.o. female who presents with complaints of dizziness.  Patient reports she woke up this morning and felt as if the room was moving whenever she moved her head with her eyes open.  She denies headache.  No neuro deficits.  She has never had this before.  Recently diagnosed with bronchitis and reports yesterday was "a good day ".  Recently had an MRI of the brain at [facility], care everywhere notes reviewed, normal MRI.  No fevers or chills.  No ringing in the ears.   Past Medical History:  Diagnosis Date  . Back pain   . Cancer ([place])    skin  . Depression    BAD  . Gastroparesis   . GERD (gastroesophageal reflux disease)   . Hypothyroidism   . Insomnia   . Migraines   . Parkinson disease ([place])    per [place] [facility], dx'd 2019  . Recurrent cold sores   . Shoulder bursitis   . Urge incontinence     Patient Active Problem List   Diagnosis Date Noted  . Hx of cold sores 03/14/2018  . Acute non-recurrent maxillary sinusitis 03/14/2018  . Parkinson disease ([place])   . Health care maintenance 06/23/2017  . HLD (hyperlipidemia) 03/10/2016  . Advance care planning 03/10/2016  . Generalized abdominal pain 02/18/2015  . Osteopenia 04/18/2014  . Medicare annual wellness visit, initial 03/18/2014  . Shoulder pain 01/17/2013  . Chronic back pain 09/14/2011  . Hypercalcemia 03/25/2010  . NAUSEA WITH VOMITING 05/12/2009  . INCONTINENCE, URGE 04/16/2009  . CARPAL TUNNEL SYNDROME, BILATERAL 09/24/2008  . URETHRAL STRICTURE 06/13/2007  . Hypothyroidism 06/05/2007  . DISORDER, BIPOLAR NOS 06/05/2007  . MIGRAINE HEADACHE 06/05/2007  . GASTROPARESIS 06/05/2007  . Gastroesophageal reflux disease with hiatal hernia 06/05/2007    . IRRITABLE BOWEL SYNDROME 06/05/2007  . FIBROCYSTIC BREAST DISEASE 06/05/2007    Past Surgical History:  Procedure Laterality Date  . ABDOMINAL HYSTERECTOMY  1999   total  . APPENDECTOMY    . ESOPHAGOGASTRODUODENOSCOPY  01/2006   negative except small hiatal hernia  . ESOPHAGOGASTRODUODENOSCOPY  02/24/2015   See report  . LAPAROSCOPY     for endometriosis  . OVARIAN CYST REMOVAL  1990   unilateral  . TONSILLECTOMY AND ADENOIDECTOMY      Prior to Admission medications   Medication Sig Start Date End Date Taking? Authorizing Provider  albuterol (PROVENTIL HFA;VENTOLIN HFA) 108 (90 Base) MCG/ACT inhaler Inhale 2 puffs into the lungs every 6 (six) hours as needed for wheezing or shortness of breath. 03/21/18   Darel Hong, MD  azithromycin (ZITHROMAX) 250 MG tablet 2 tabs a day for 1 day and then 1 a day for 4 days. 03/13/18   Tonia Ghent, MD  benzonatate (TESSALON) 200 MG capsule Take 1 capsule (200 mg total) by mouth 3 (three) times daily as needed. 03/13/18   Tonia Ghent, MD  CHANTIX 1 MG tablet TAKE 1 TABLET BY MOUTH TWICE DAILY 12/22/17   Tonia Ghent, MD  cholecalciferol (VITAMIN D) 1000 units tablet Take 1 tablet (1,000 Units total) by mouth daily. 06/22/17   Tonia Ghent, MD  diphenhydrAMINE (BENADRYL) 25 MG tablet Take 2 by mouth prior to [place]     [provider]  levothyroxine (SYNTHROID, LEVOTHROID) 88 MCG tablet TAKE 1 TABLET(88 MCG) BY MOUTH DAILY 06/22/17   Tonia Ghent, MD  loratadine (CLARITIN) 10 MG tablet Take 1 tablet (10 mg total) by mouth daily. 12/14/15   Marylene Land, NP  magic mouthwash SOLN Take 5 mLs by mouth 3 (three) times daily as needed for mouth pain. 01/12/18   Tonia Ghent, MD  meclizine (ANTIVERT) 25 MG tablet Take 1 tablet (25 mg total) by mouth 3 (three) times daily as needed for dizziness. 03/22/18   Lavonia Drafts, MD  Multiple Vitamin (MULTIVITAMIN) tablet Take 1 tablet by mouth daily.      [provider]  nortriptyline (PAMELOR) 25 MG capsule Take 25 mg by mouth at bedtime.    [provider]  Omega-3 Fatty Acids (FISH OIL) 1000 MG CAPS Take 1 capsule (1,000 mg total) by mouth daily. 06/22/17   Tonia Ghent, MD  omeprazole (PRILOSEC) 20 MG capsule TAKE 1 CAPSULE BY MOUTH EVERY MORNING 45 MINUTES BEFORE BREAKFAST 06/22/17   Tonia Ghent, MD  ondansetron (ZOFRAN-ODT) 4 MG disintegrating tablet DISSOLVE 1 TABLET BY MOUTH AS NEEDED 05/15/17   Tonia Ghent, MD  OVER THE COUNTER MEDICATION [place] daily for menopausal symptoms.    [provider]  Oxybutynin Chloride (GELNIQUE) 10 % GEL Place onto the skin daily.      [provider]  oxyCODONE (OXY IR/ROXICODONE) 5 MG immediate release tablet Take 5 mg by mouth every 4 (four) hours as needed for severe pain.    [provider]  predniSONE (DELTASONE) 50 MG tablet Take 1 tablet (50 mg total) by mouth daily for 5 days. 03/21/18 03/26/18  Darel Hong, MD  propranolol (INDERAL) 10 MG tablet Take 10 mg by mouth 3 (three) times daily.    [provider]  sodium chloride (OCEAN) 0.65 % SOLN nasal spray Place 2 sprays into the nose every 6 (six) hours as needed for congestion. 12/14/15   Marylene Land, NP  Spacer/Aero Chamber Mouthpiece MISC 1 Units by Does not apply route every 4 (four) hours as needed (wheezing). 03/21/18   Darel Hong, MD  traZODone (DESYREL) 50 MG tablet Take 25 mg by mouth at bedtime.    [provider]  triamcinolone (KENALOG) 0.025 % cream Apply to affected areas two to three times per week for itching only.    [provider]  valACYclovir (VALTREX) 500 MG tablet Take 1 tablet (500 mg total) by mouth 2 (two) times daily. 03/14/18   Tonia Ghent, MD  venlafaxine (EFFEXOR) 75 MG tablet Take 75 mg by mouth daily.     [provider]  vitamin E 200 UNIT capsule Take 200 Units by mouth daily.      [provider]      Allergies Aspirin; Baclofen; Codeine; Erythromycin; Gabapentin; Metoclopramide hcl; Nsaids; Nucynta er [tapentadol hcl er]; Nucynta [tapentadol]; Prednisone; Pregabalin; Seroquel [quetiapine fumerate]; Sulfa antibiotics; Topamax; and Vicodin [hydrocodone-acetaminophen]  Family History  Problem Relation Age of Onset  . Hypertension Mother   . Arthritis Mother   . Diabetes Mother   . Stroke Mother   . Cancer Father        lung  . Colon cancer Neg Hx   . Breast cancer Neg Hx     Social History Social History   Tobacco Use  . Smoking status: Current Every Day Smoker    Packs/day: 1.00    Years: 42.00    Pack years: 42.00  Types: Cigarettes  . Smokeless tobacco: Former Network engineer Use Topics  . Alcohol use: No    Alcohol/week: 0.0 oz  . Drug use: No    Review of Systems  Constitutional: No fever/chills Eyes: No visual changes.  ENT: No neck pain, no tinnitus Cardiovascular: Denies chest pain. Respiratory: Denies shortness of breath. Gastrointestinal: No abdominal pain.   Genitourinary: Negative for dysuria. Musculoskeletal: Negative for back pain. Skin: Negative for rash. Neurological: Negative for headaches   ____________________________________________   PHYSICAL EXAM:  VITAL SIGNS: ED Triage Vitals  Enc Vitals Group     BP 03/22/18 1219 (!) 133/92     Pulse Rate 03/22/18 1219 82     Resp 03/22/18 1219 20     Temp 03/22/18 1219 98.1 F (36.7 C)     Temp Source 03/22/18 1219 Oral     SpO2 03/22/18 1213 98 %     Weight 03/22/18 1220 62.1 kg (137 lb)     Height 03/22/18 1220 1.524 m (5')     Head Circumference --      Peak Flow --      Pain Score 03/22/18 1220 7     Pain Loc --      Pain Edu? --      Excl. in [place]? --     Constitutional: Alert and oriented, keeping eyes closed during exam Eyes: Conjunctivae are normal.  Head: Atraumatic. Nose: No congestion/rhinnorhea. Mouth/Throat: Mucous membranes are moist.   Ears: Normal Neck:   Painless ROM Cardiovascular: Normal rate, regular rhythm. Grossly normal heart sounds.  Good peripheral circulation. Respiratory: Normal respiratory effort.  No retractions. Gastrointestinal: Soft and nontender. No distention.  No CVA tenderness. Genitourinary: deferred Musculoskeleta Warm and well perfused Neurologic:  Normal speech and language. No gross focal neurologic deficits are appreciated.  Skin:  Skin is warm, dry and intact. No rash noted. Psychiatric: Mood and affect are normal. Speech and behavior are normal.  ____________________________________________   LABS (all labs ordered are listed, but only abnormal results are displayed)  Labs Reviewed - No data to display ____________________________________________  EKG  None ____________________________________________  RADIOLOGY  None ____________________________________________   PROCEDURES  Procedure(s) performed: No  Procedures   Critical Care performed:No ____________________________________________   INITIAL IMPRESSION / ASSESSMENT AND PLAN / ED COURSE  Pertinent labs & imaging results that were available during my care of the patient were reviewed by me and considered in my medical decision making (see chart for details).  Patient presents with symptoms that appear consistent with vertigo, normal neuro exam.  No tinnitus.  Will treat with IV fluids, meclizine, IV Zofran and reevaluate.  ----------------------------------------- 4:03 PM on 03/22/2018 -----------------------------------------  After treatment patient's vertigo has essentially resolved.  She feels significantly better, reviewed lab work from 1 day ago which was unremarkable.  We will discharge with meclizine, outpatient follow-up and return precautions discussed   ____________________________________________   FINAL CLINICAL IMPRESSION(S) / ED DIAGNOSES  Final diagnoses:  Vertigo        Note:  This document was prepared  using Dragon voice recognition software and may include unintentional dictation errors.    Lavonia Drafts, MD 03/22/18 [phone]

## 2018-03-22 NOTE — Telephone Encounter (Signed)
ER notes reviewed.  Thanks.

## 2018-03-25 DIAGNOSIS — Z882 Allergy status to sulfonamides status: Secondary | ICD-10-CM | POA: Diagnosis not present

## 2018-03-25 DIAGNOSIS — Z881 Allergy status to other antibiotic agents status: Secondary | ICD-10-CM | POA: Diagnosis not present

## 2018-03-25 DIAGNOSIS — Z720 Tobacco use: Secondary | ICD-10-CM | POA: Diagnosis not present

## 2018-03-25 DIAGNOSIS — L0291 Cutaneous abscess, unspecified: Secondary | ICD-10-CM | POA: Diagnosis not present

## 2018-03-25 DIAGNOSIS — J441 Chronic obstructive pulmonary disease with (acute) exacerbation: Secondary | ICD-10-CM | POA: Diagnosis not present

## 2018-03-25 DIAGNOSIS — F17210 Nicotine dependence, cigarettes, uncomplicated: Secondary | ICD-10-CM | POA: Diagnosis not present

## 2018-03-25 DIAGNOSIS — Z885 Allergy status to narcotic agent status: Secondary | ICD-10-CM | POA: Diagnosis not present

## 2018-03-25 DIAGNOSIS — R0989 Other specified symptoms and signs involving the circulatory and respiratory systems: Secondary | ICD-10-CM | POA: Diagnosis not present

## 2018-03-25 DIAGNOSIS — Z886 Allergy status to analgesic agent status: Secondary | ICD-10-CM | POA: Diagnosis not present

## 2018-03-25 DIAGNOSIS — R05 Cough: Secondary | ICD-10-CM | POA: Diagnosis not present

## 2018-03-25 DIAGNOSIS — R0602 Shortness of breath: Secondary | ICD-10-CM | POA: Diagnosis not present

## 2018-03-25 DIAGNOSIS — F419 Anxiety disorder, unspecified: Secondary | ICD-10-CM | POA: Diagnosis not present

## 2018-03-26 ENCOUNTER — Telehealth: Payer: Self-pay

## 2018-03-26 NOTE — Telephone Encounter (Signed)
PLEASE NOTE: All timestamps contained within this report are represented as [place] Standard Time. CONFIDENTIALTY NOTICE: This fax transmission is intended only for the addressee. It contains information that is legally privileged, confidential or otherwise protected from use or disclosure. If you are not the intended recipient, you are strictly prohibited from reviewing, disclosing, copying using or disseminating any of this information or taking any action in reliance on or regarding this information. If you have received this fax in error, please notify us immediately by telephone so that we can arrange for its return to [name]. Phone: [phone], Toll-Free: [phone], Fax: [phone] Page: 1 of 2 Call Id: [id number] [place] Patient Name: Leslie Wong Gender: Female DOB: December 05, 1956 Age: 62 Y 3 M 25 D Return Phone Number: [phone] (Primary), [phone] (Secondary) Address: [place] [address] Client [place] [facility] - Client Client Site [place] Physician Renford Dills - MD Contact Type Call Who Is Calling Patient / Member / Family / Caregiver Call Type Triage / Clinical Relationship To Patient Self Return Phone Number [phone] (Primary) Chief Complaint Cough Reason for Call Symptomatic / Request for Health Information Initial Comment Caller States she has a cold sweat, horrible body odor, thinks she may have a temp on and off. Also has a cough, diarrhea. and runny nose. [place] Not Listed [facility] Translation No Nurse Assessment Nurse: Mills-Hernandez, RN, Izora Gala Date/Time (Eastern Time): 03/25/2018 10:15:00 AM Confirm and document reason for call. If symptomatic, describe symptoms. ---Caller States she has a cold sweats, states she was at the ED on Thursday and dx with bronchitis now she thinks she  has pneumonia. Has a cough, runny nose, breathing rapidly, diarrhea. No fever. Does the patient have any new or worsening symptoms? ---Yes Will a triage be completed? ---Yes Related visit to physician within the last 2 weeks? ---Yes Does the PT have any chronic conditions? (i.e. diabetes, asthma, etc.) ---Yes List chronic conditions. ---chronic back pain, Is this a behavioral health or substance abuse call? ---No Guidelines Guideline Title Affirmed Question Affirmed Notes Nurse Date/Time (Eastern Time) Breathing Difficulty [1] MODERATE difficulty breathing (e.g., speaks in phrases, SOB even at rest, pulse 100-120) AND [2] NEWonset or WORSE than normal Mills-Hernandez, RN, Izora Gala 03/25/2018 10:20:04 AM Disp. Time Eilene Ghazi Time) Disposition Final User PLEASE NOTE: All timestamps contained within this report are represented as [place] Standard Time. CONFIDENTIALTY NOTICE: This fax transmission is intended only for the addressee. It contains information that is legally privileged, confidential or otherwise protected from use or disclosure. If you are not the intended recipient, you are strictly prohibited from reviewing, disclosing, copying using or disseminating any of this information or taking any action in reliance on or regarding this information. If you have received this fax in error, please notify us immediately by telephone so that we can arrange for its return to [name]. Phone: [phone], Toll-Free: [phone], Fax: [phone] Page: 2 of 2 Call Id: [id number] 03/25/2018 10:23:38 AM Go to ED Now Yes Mills-Hernandez, RN, Cindee Salt Disagree/Comply Comply Caller Understands Yes PreDisposition Did not know what to do Care Advice Given Per Guideline GO TO ED NOW: You need to be seen in the Emergency Department. Go to the ER at ___________ [place] now. Drive carefully. * Another adult should drive. BRING MEDICINES: * Please bring a list of your current medicines when you  go to the Emergency Department (ER). CALL EMS  911 IF: you become worse. CARE ADVICE given per Breathing Difficulty (Adult) guideline. Referrals GO TO FACILITY OTHER - SPECIFY

## 2018-03-26 NOTE — Telephone Encounter (Signed)
Noted. Thanks. I'll defer.  

## 2018-03-26 NOTE — Telephone Encounter (Signed)
I spoke with pt and she went to [facility] on 03/25/18. Pt was given prednisone and pt said the prednisone is working; she did not wake up sweating this morning. Pt wanted Dr Damita Dunnings to know she has stopped smoking. Pt will cb if needed.FYI to Dr Damita Dunnings.

## 2018-03-27 DIAGNOSIS — Z87891 Personal history of nicotine dependence: Secondary | ICD-10-CM | POA: Diagnosis not present

## 2018-03-27 DIAGNOSIS — Z716 Tobacco abuse counseling: Secondary | ICD-10-CM | POA: Diagnosis not present

## 2018-03-27 DIAGNOSIS — M1288 Other specific arthropathies, not elsewhere classified, other specified site: Secondary | ICD-10-CM | POA: Diagnosis not present

## 2018-03-27 DIAGNOSIS — Z720 Tobacco use: Secondary | ICD-10-CM | POA: Diagnosis not present

## 2018-03-27 DIAGNOSIS — M25561 Pain in right knee: Secondary | ICD-10-CM | POA: Diagnosis not present

## 2018-03-27 DIAGNOSIS — M545 Low back pain: Secondary | ICD-10-CM | POA: Diagnosis not present

## 2018-03-27 DIAGNOSIS — M5136 Other intervertebral disc degeneration, lumbar region: Secondary | ICD-10-CM | POA: Diagnosis not present

## 2018-03-27 DIAGNOSIS — F17200 Nicotine dependence, unspecified, uncomplicated: Secondary | ICD-10-CM | POA: Diagnosis not present

## 2018-04-02 ENCOUNTER — Telehealth: Payer: Self-pay | Admitting: Family Medicine

## 2018-04-02 NOTE — Telephone Encounter (Signed)
Left message on patient's voicemail to return call with update. 

## 2018-04-02 NOTE — Telephone Encounter (Signed)
[facility] Patient Name: Leslie Wong Gender: Female DOB: [birth year] Age: 62 Y 20 M Return Phone Number: [phone] (Primary), [phone] (Secondary) Address: [place] [address] Client [place] Night - Client Client Site [place] Physician Renford Dills - MD Contact Type Call Who Is Calling Patient / Member / Family / Caregiver Call Type Triage / Clinical Relationship To Patient Self Return Phone Number [phone] (Primary) Chief Complaint Flu Symptom Reason for Call Symptomatic / Request for [place] Call - caller feels the same and would like to speak to a nurse Caller states she has intestinal flu, abdominal pain and stomach feels warm, fever, tested last Sunday, but didn't say what it was. Has bronchitis, but finished prednisone, no coughing. Translation No Nurse Assessment Nurse: Mills-Hernandez, RN, Izora Gala Date/Time (Eastern Time): 03/31/2018 2:56:04 PM Confirm and document reason for call. If symptomatic, describe symptoms. ---Caller states she has intestinal flu, abdominal pain and stomach feels warm, fever, tested last Sunday for the flu which was negative. Told she had bronchitis, but finished prednisone, no coughing. She is having abdominal pain and diarrhea. Has had diarrhea since Monday and has lost 8 lbs. Does the patient have any new or worsening symptoms? ---Yes Will a triage be completed? ---Yes Related visit to physician within the last 2 weeks? ---Yes Does the PT have any chronic conditions? (i.e. diabetes, asthma, etc.) ---Yes List chronic conditions. ---chronic pain Is this a behavioral health or substance abuse call? ---No Guidelines Guideline Title Affirmed Question Affirmed Notes Nurse Date/Time (Eastern Time) Diarrhea [1] SEVERE abdominal pain  (e.g., excruciating) AND [2] present > 1 hour Mills-Hernandez, RN, Izora Gala 03/31/2018 2:58:36 PM Disp. Time Eilene Ghazi Time) Disposition Final User 03/31/2018 3:02:39 PM Go to ED Now Yes Mills-Hernandez, RN, Izora Gala PLEASE NOTE: All timestamps contained within this report are represented as [place] Standard Time. CONFIDENTIALTY NOTICE: This fax transmission is intended only for the addressee. It contains information that is legally privileged, confidential or otherwise protected from use or disclosure. If you are not the intended recipient, you are strictly prohibited from reviewing, disclosing, copying using or disseminating any of this information or taking any action in reliance on or regarding this information. If you have received this fax in error, please notify us immediately by telephone so that we can arrange for its return to [name]. Phone: [phone], Toll-Free: [phone], Fax: [phone] Page: 2 of 2 Call Id: [id number] [place] Disagree/Comply Disagree Caller Understands Yes PreDisposition Call Doctor Care Advice Given Per Guideline GO TO ED NOW: You need to be seen in the Emergency Department. Go to the ER at ___________ [place] now. Drive carefully. DRIVING: Another adult should drive. BRING MEDICINES: Please bring a list of your current medicines when you go to the Emergency Department (ER).It is also a good idea to bring the pill bottles too. This will help the doctor to make certain you are taking the right medicines and the right dose. CARE ADVICE given per Diarrhea (Adult) guideline. Comments User: Haroldine Laws, RN Date/Time Eilene Ghazi Time): 03/31/2018 3:02:22 PM Caller states she has already been to the ED 3 times this past week and won't go back there. Will wait and see her doctor on Monday. Referrals GO TO FACILITY REFUSED

## 2018-04-02 NOTE — Telephone Encounter (Signed)
Please get update on patient.  Thanks. 

## 2018-04-03 NOTE — Telephone Encounter (Signed)
Patient says her psychiatrist put her on Cymbalta and she is having severe side effects from it and after talking to the psychiatrist yesterday, he took her off the medication but she is still having the severe side effects.  Patient has appointment with Dr. Damita Dunnings on Thursday morning.

## 2018-04-04 NOTE — Telephone Encounter (Signed)
Patient notified as instructed by telephone and verbalized understanding. Patient stated that she is not going to the ER because she has been to the ER 4 times recently.  Patient stated that she has stopped the Cymbalta and will just see Dr. Damita Dunnings tomorrow.

## 2018-04-04 NOTE — Telephone Encounter (Signed)
I'll see her then but please give her routine ER cautions in the meantime.  Thanks.

## 2018-04-05 ENCOUNTER — Encounter: Payer: Self-pay | Admitting: Family Medicine

## 2018-04-05 ENCOUNTER — Ambulatory Visit (INDEPENDENT_AMBULATORY_CARE_PROVIDER_SITE_OTHER): Payer: Medicare Other | Admitting: Family Medicine

## 2018-04-05 VITALS — BP 130/76 | HR 89 | Temp 98.5°F | Wt 129.5 lb

## 2018-04-05 DIAGNOSIS — G8929 Other chronic pain: Secondary | ICD-10-CM | POA: Diagnosis not present

## 2018-04-05 DIAGNOSIS — R7989 Other specified abnormal findings of blood chemistry: Secondary | ICD-10-CM

## 2018-04-05 DIAGNOSIS — M549 Dorsalgia, unspecified: Secondary | ICD-10-CM | POA: Diagnosis not present

## 2018-04-05 DIAGNOSIS — G20 Parkinson's disease: Secondary | ICD-10-CM

## 2018-04-05 DIAGNOSIS — E039 Hypothyroidism, unspecified: Secondary | ICD-10-CM | POA: Diagnosis not present

## 2018-04-05 DIAGNOSIS — K149 Disease of tongue, unspecified: Secondary | ICD-10-CM | POA: Diagnosis not present

## 2018-04-05 DIAGNOSIS — T50905A Adverse effect of unspecified drugs, medicaments and biological substances, initial encounter: Secondary | ICD-10-CM | POA: Diagnosis not present

## 2018-04-05 DIAGNOSIS — R232 Flushing: Secondary | ICD-10-CM

## 2018-04-05 LAB — CBC WITH DIFFERENTIAL/PLATELET
Basophils Absolute: 0.1 10*3/uL (ref 0.0–0.1)
Basophils Relative: 0.7 % (ref 0.0–3.0)
EOS PCT: 1.7 % (ref 0.0–5.0)
Eosinophils Absolute: 0.2 10*3/uL (ref 0.0–0.7)
HEMATOCRIT: 44.6 % (ref 36.0–46.0)
Hemoglobin: 15 g/dL (ref 12.0–15.0)
LYMPHS PCT: 32.4 % (ref 12.0–46.0)
Lymphs Abs: 4.2 10*3/uL — ABNORMAL HIGH (ref 0.7–4.0)
MCHC: 33.6 g/dL (ref 30.0–36.0)
MCV: 97.7 fl (ref 78.0–100.0)
MONOS PCT: 8.1 % (ref 3.0–12.0)
Monocytes Absolute: 1.1 10*3/uL — ABNORMAL HIGH (ref 0.1–1.0)
NEUTROS ABS: 7.5 10*3/uL (ref 1.4–7.7)
Neutrophils Relative %: 57.1 % (ref 43.0–77.0)
PLATELETS: 265 10*3/uL (ref 150.0–400.0)
RBC: 4.56 Mil/uL (ref 3.87–5.11)
RDW: 15 % (ref 11.5–15.5)
WBC: 13.1 10*3/uL — ABNORMAL HIGH (ref 4.0–10.5)

## 2018-04-05 LAB — COMPREHENSIVE METABOLIC PANEL
ALT: 30 U/L (ref 0–35)
AST: 24 U/L (ref 0–37)
Albumin: 4.1 g/dL (ref 3.5–5.2)
Alkaline Phosphatase: 102 U/L (ref 39–117)
BILIRUBIN TOTAL: 0.3 mg/dL (ref 0.2–1.2)
BUN: 13 mg/dL (ref 6–23)
CO2: 31 meq/L (ref 19–32)
Calcium: 10.4 mg/dL (ref 8.4–10.5)
Chloride: 105 mEq/L (ref 96–112)
Creatinine, Ser: 0.89 mg/dL (ref 0.40–1.20)
GFR: 68.92 mL/min (ref >60.00)
Glucose, Bld: 101 mg/dL — ABNORMAL HIGH (ref 70–99)
POTASSIUM: 4.2 meq/L (ref 3.5–5.1)
Sodium: 141 mEq/L (ref 135–145)
Total Protein: 7 g/dL (ref 6.0–8.3)

## 2018-04-05 LAB — TSH: TSH: 0.81 u[IU]/mL (ref 0.35–4.50)

## 2018-04-05 MED ORDER — MAGIC MOUTHWASH: 5.0000 mL | Freq: Three times a day (TID) | ORAL | 0 refills | Status: DC | PRN

## 2018-04-05 NOTE — Progress Notes (Signed)
She quit smoking last month.  Cough is resolved.   Last cymbalta dose was 3 days ago.  Off med now.  She has been having hot flashes and cold sweats that predate stoppage of medicine but started after initiation of med.   This is different from prev menopause sx.    She has been off abilify and wellbutrin for about 8 weeks, so this is likely noncontributory.  She has oral blisters.  She needed refill on magic mouthwash, to use prn.    Meds, vitals, and allergies reviewed.   ROS: Per HPI unless specifically indicated in ROS section   GEN: nad, alert and oriented HEENT: mucous membranes moist, OP wnl except for tongue irritation noted.  No frank ulceration noted. NECK: supple w/o LA CV: rrr. PULM: ctab, no inc wob ABD: soft, +bs EXT: no edema

## 2018-04-05 NOTE — Patient Instructions (Addendum)
I presume some of your symptoms are related to the cymbalta but I can't prove it.   Go to the lab on the way out.  We'll contact you with your lab report. Stay off cymbalta for now.  I would expect you to get some better in the next few days.   Use the magic mouthwash in the meantime.  Take care.  Glad to see you.

## 2018-04-07 NOTE — Assessment & Plan Note (Signed)
Use magic mouthwash in the meantime and update me as needed.

## 2018-04-07 NOTE — Assessment & Plan Note (Signed)
She had previously been placed on Cymbalta. Last cymbalta dose was 3 days ago.  Off med now.  She has been having hot flashes and cold sweats that predate stoppage of medicine but started after initiation of med.   This is different from prev menopause sx.   I presume some of her symptoms are related to the cymbalta but I can't prove it.   Check routine labs today. Stay off cymbalta for now.  I would expect you to get some better in the next few days.

## 2018-04-10 ENCOUNTER — Ambulatory Visit: Payer: Self-pay | Admitting: *Deleted

## 2018-04-10 NOTE — Telephone Encounter (Signed)
Patient advised.

## 2018-04-10 NOTE — Telephone Encounter (Signed)
I gave this some consideration.  If not any better, then I want them to run this by [facility] to see if they have anything to offer.  If not better at this point, then I don't suspect this is from cymbalta and I question how much of this is related to parkinsons.  Thanks.

## 2018-04-10 NOTE — Telephone Encounter (Signed)
Pt seen 04/05/18.

## 2018-04-10 NOTE — Telephone Encounter (Signed)
Best Contact # [phone] Patient had reaction to Cymbalta and is still having symptoms.Told patient I would send message in so Dr Damita Dunnings can decide what he wants to do - he may want to see her in the office again.  Reason for Disposition . Caller has NON-URGENT medication question about med that PCP prescribed and triager unable to answer question  Answer Assessment - Initial Assessment Questions 1. SYMPTOMS: "Do you have any symptoms?"     Patient reports- Friday/saturday - nausea /vomiting- 1 time each day Excessive sweating- at night- bed is soaked- patient is also sweating during the day Loss of appetite Numbness and tingling of hands Blurred vision Every morning patient has loose stool with urination Feels like she is on fire- then has chills Gas and itching Weight loss Lightheadness  2. SEVERITY: If symptoms are present, ask "Are they mild, moderate or severe?"     Mouth has healed- but patient is not hungry. Patient has been off the medication for 1 week with no improvement. Patient is drinking plenty of fluids and she is drinking Ensure in the morning. She is trying to force herself to eat. She is feeling weak.  Protocols used: MEDICATION QUESTION CALL-A-AH

## 2018-04-23 DIAGNOSIS — G8929 Other chronic pain: Secondary | ICD-10-CM | POA: Diagnosis not present

## 2018-04-23 DIAGNOSIS — M5030 Other cervical disc degeneration, unspecified cervical region: Secondary | ICD-10-CM | POA: Insufficient documentation

## 2018-04-23 DIAGNOSIS — M542 Cervicalgia: Secondary | ICD-10-CM | POA: Diagnosis not present

## 2018-04-23 DIAGNOSIS — M25532 Pain in left wrist: Secondary | ICD-10-CM | POA: Diagnosis not present

## 2018-04-23 DIAGNOSIS — M1812 Unilateral primary osteoarthritis of first carpometacarpal joint, left hand: Secondary | ICD-10-CM | POA: Diagnosis not present

## 2018-04-23 DIAGNOSIS — M7551 Bursitis of right shoulder: Secondary | ICD-10-CM | POA: Diagnosis not present

## 2018-04-23 DIAGNOSIS — M5412 Radiculopathy, cervical region: Secondary | ICD-10-CM | POA: Diagnosis not present

## 2018-04-23 DIAGNOSIS — M7552 Bursitis of left shoulder: Secondary | ICD-10-CM | POA: Diagnosis not present

## 2018-05-02 NOTE — Telephone Encounter (Signed)
Patient expressed that she is still have excessive sweating spells.  She wakes up at night all wet.  It comes and goes throughout the day.  She wants to know what the next step is to finding out what's going on.  Should she see some kind of specialist?  Please advise.

## 2018-05-03 NOTE — Telephone Encounter (Signed)
I'm not clear about the cause.  Have her run this by the clinic at [facility] and see if they have anything to offer.  I need their input.  Thanks.

## 2018-05-04 NOTE — Telephone Encounter (Signed)
Duly noted, let me consider options in the meantime and tell her I'll be glad to see her on Monday.  Thanks.

## 2018-05-04 NOTE — Telephone Encounter (Signed)
Left detailed message on voicemail (on DPR) per Dr. Damita Dunnings.

## 2018-05-04 NOTE — Telephone Encounter (Signed)
Patient notified as instructed by telephone and verbalized understanding. Patient stated that she called the doctor at [facility] and was told to contact Dr. Damita Dunnings. Patient stated that she started with the sweating when she started the Cymbalta. Patient stated that she stopped the Cymbalta on 04/02/18 and is still having the problem with sweating.  Patient has an appointment scheduled with Dr, Damita Dunnings Monday to discuss this and see if she needs to go to a specialist for this problem.

## 2018-05-07 ENCOUNTER — Ambulatory Visit: Payer: Medicare Other | Admitting: Family Medicine

## 2018-05-07 DIAGNOSIS — Z0289 Encounter for other administrative examinations: Secondary | ICD-10-CM

## 2018-05-10 ENCOUNTER — Ambulatory Visit (INDEPENDENT_AMBULATORY_CARE_PROVIDER_SITE_OTHER): Payer: Medicare Other | Admitting: Family Medicine

## 2018-05-10 ENCOUNTER — Encounter: Payer: Self-pay | Admitting: Family Medicine

## 2018-05-10 VITALS — BP 116/74 | HR 68 | Temp 98.6°F | Ht 59.75 in | Wt 127.5 lb

## 2018-05-10 DIAGNOSIS — R61 Generalized hyperhidrosis: Secondary | ICD-10-CM

## 2018-05-10 DIAGNOSIS — R7989 Other specified abnormal findings of blood chemistry: Secondary | ICD-10-CM

## 2018-05-10 DIAGNOSIS — H5702 Anisocoria: Secondary | ICD-10-CM

## 2018-05-10 LAB — CBC WITH DIFFERENTIAL/PLATELET
Basophils Absolute: 0.1 10*3/uL (ref 0.0–0.1)
Basophils Relative: 0.5 % (ref 0.0–3.0)
EOS ABS: 0.1 10*3/uL (ref 0.0–0.7)
Eosinophils Relative: 1.2 % (ref 0.0–5.0)
HCT: 44.1 % (ref 36.0–46.0)
HEMOGLOBIN: 15 g/dL (ref 12.0–15.0)
LYMPHS ABS: 2.8 10*3/uL (ref 0.7–4.0)
Lymphocytes Relative: 29.9 % (ref 12.0–46.0)
MCHC: 33.9 g/dL (ref 30.0–36.0)
MCV: 95.6 fl (ref 78.0–100.0)
MONO ABS: 0.7 10*3/uL (ref 0.1–1.0)
Monocytes Relative: 7.4 % (ref 3.0–12.0)
NEUTROS PCT: 61 % (ref 43.0–77.0)
Neutro Abs: 5.7 10*3/uL (ref 1.4–7.7)
Platelets: 249 10*3/uL (ref 150.0–400.0)
RBC: 4.62 Mil/uL (ref 3.87–5.11)
RDW: 13.8 % (ref 11.5–15.5)
WBC: 9.4 10*3/uL (ref 4.0–10.5)

## 2018-05-10 NOTE — Patient Instructions (Signed)
Go to the lab on the way out.  We'll contact you with your lab report. Take care.  Glad to see you.  

## 2018-05-10 NOTE — Telephone Encounter (Signed)
See OV notes

## 2018-05-10 NOTE — Progress Notes (Signed)
She is back on chantix.  She smoked 2 cigs yesterday, she is working on quitting.  No ADE on chantix.  Cessation encouraged.  Waking up from sleep soaked with sweat.  Night and day sx.  She had double vision with the events prev but not o/w.  She smells differently during the events, meaning she has an abnormal scent that others can detect.    Prev MRI 02/2018 with no acute intracranial abnormality. No specific etiology for memory loss identified. Mild parenchymal volume loss without lobar predilection.  She started cymbalta 01/30/18, and that predates the MRI.  Double vision predates the MRI.  Cymbalta start predates the symptom onset  Recent CT chest done.  No mass seen.  D/w pt.  Less frequent episodes of sweats now.  Previous evaluation has been unremarkable.  She did not think that these episodes were in any way related to menopause.  PMH and SH reviewed  ROS: Per HPI unless specifically indicated in ROS section   Meds, vitals, and allergies reviewed.   GEN: nad, alert and oriented HEENT: mucous membranes moist, L>R pupil at baseline- old change.  NECK: supple w/o LA CV: rrr. PULM: ctab, no inc wob ABD: soft, +bs EXT: no edema SKIN: no acute rash

## 2018-05-11 LAB — PATHOLOGIST SMEAR REVIEW

## 2018-05-13 ENCOUNTER — Other Ambulatory Visit: Payer: Self-pay | Admitting: Family Medicine

## 2018-05-13 DIAGNOSIS — R61 Generalized hyperhidrosis: Secondary | ICD-10-CM | POA: Insufficient documentation

## 2018-05-13 DIAGNOSIS — R7989 Other specified abnormal findings of blood chemistry: Secondary | ICD-10-CM | POA: Insufficient documentation

## 2018-05-13 DIAGNOSIS — H5702 Anisocoria: Secondary | ICD-10-CM | POA: Insufficient documentation

## 2018-05-13 NOTE — Assessment & Plan Note (Signed)
She has night sweats but they also happen during the day too.  All of the symptoms started after she started taking Cymbalta.  Fortunately some of the symptoms are getting better, she is having less frequent episodes now.  At this point it is unlikely that her symptoms are related to Parkinson's.  She did not think these were typical menopausal symptoms.  There was no mass or known neoplasm otherwise seen on recent imaging of brain and chest.  She does have a history of abnormal CBC and it is reasonable to follow that up and get peripheral smear done.  Fortunately both were unremarkable.  She is off Cymbalta and since she has stopped the medication she has had a slow improvement in her symptoms, with a slow decrease in frequency of symptoms.  It may still be the case the Cymbalta was the main issue here and that she will have continual gradual improvement off the medication.  I think at this point observation is most appropriate and she will update me as needed.  All >25 minutes spent in face to face time with patient, >50% spent in counselling or coordination of care.

## 2018-05-13 NOTE — Assessment & Plan Note (Signed)
L>R pupil at baseline- old change.

## 2018-05-14 NOTE — Telephone Encounter (Signed)
Electronic refill request Last office visit 5/16/198 Reported back on Chantix at office visit

## 2018-05-15 ENCOUNTER — Telehealth: Payer: Self-pay | Admitting: *Deleted

## 2018-05-15 NOTE — Telephone Encounter (Signed)
PA is approved, faxed to pharmacy and scanned.

## 2018-05-15 NOTE — Telephone Encounter (Signed)
PA for Chantix 1 mg submitted thru CMM, awaiting response.

## 2018-05-15 NOTE — Telephone Encounter (Signed)
Sent. Thanks.   

## 2018-06-04 ENCOUNTER — Other Ambulatory Visit: Payer: Self-pay | Admitting: Family Medicine

## 2018-06-04 DIAGNOSIS — Z1231 Encounter for screening mammogram for malignant neoplasm of breast: Secondary | ICD-10-CM

## 2018-06-04 NOTE — Telephone Encounter (Signed)
Name of Medication: Ondansetron Name of Pharmacy: [facility] or Written Date and Quantity:   20 tablet 0 05/15/2017  Last Office Visit and Type: 05/10/18 Acute Next Office Visit and Type: 06/25/18 CPE Last Controlled Substance Agreement Date:  Last UDS:

## 2018-06-05 NOTE — Telephone Encounter (Signed)
Sent. Thanks.   

## 2018-06-18 ENCOUNTER — Ambulatory Visit (INDEPENDENT_AMBULATORY_CARE_PROVIDER_SITE_OTHER): Payer: Medicare Other

## 2018-06-18 ENCOUNTER — Ambulatory Visit: Payer: Medicare Other

## 2018-06-18 VITALS — BP 122/78 | HR 103 | Temp 99.1°F | Ht 60.5 in | Wt 120.5 lb

## 2018-06-18 DIAGNOSIS — E559 Vitamin D deficiency, unspecified: Secondary | ICD-10-CM

## 2018-06-18 DIAGNOSIS — Z0000 Encounter for general adult medical examination without abnormal findings: Secondary | ICD-10-CM

## 2018-06-18 DIAGNOSIS — E039 Hypothyroidism, unspecified: Secondary | ICD-10-CM | POA: Diagnosis not present

## 2018-06-18 DIAGNOSIS — E785 Hyperlipidemia, unspecified: Secondary | ICD-10-CM | POA: Diagnosis not present

## 2018-06-18 LAB — COMPREHENSIVE METABOLIC PANEL
ALBUMIN: 4.5 g/dL (ref 3.5–5.2)
ALK PHOS: 102 U/L (ref 39–117)
ALT: 25 U/L (ref 0–35)
AST: 26 U/L (ref 0–37)
BUN: 12 mg/dL (ref 6–23)
CALCIUM: 11.1 mg/dL — AB (ref 8.4–10.5)
CHLORIDE: 104 meq/L (ref 96–112)
CO2: 26 mEq/L (ref 19–32)
Creatinine, Ser: 0.79 mg/dL (ref 0.40–1.20)
GFR: 79.02 mL/min (ref >60.00)
Glucose, Bld: 114 mg/dL — ABNORMAL HIGH (ref 70–99)
Potassium: 4.2 mEq/L (ref 3.5–5.1)
Sodium: 139 mEq/L (ref 135–145)
Total Bilirubin: 0.4 mg/dL (ref 0.2–1.2)
Total Protein: 7.6 g/dL (ref 6.0–8.3)

## 2018-06-18 LAB — LIPID PANEL
Cholesterol: 233 mg/dL — ABNORMAL HIGH (ref 0–200)
HDL: 33.9 mg/dL — AB (ref >39.00)
NonHDL: 199.26
TRIGLYCERIDES: 235 mg/dL — AB (ref 0.0–149.0)
Total CHOL/HDL Ratio: 7
VLDL: 47 mg/dL — AB (ref 0.0–40.0)

## 2018-06-18 LAB — VITAMIN D 25 HYDROXY (VIT D DEFICIENCY, FRACTURES): VITD: 35.32 ng/mL (ref 30.00–100.00)

## 2018-06-18 LAB — LDL CHOLESTEROL, DIRECT: LDL DIRECT: 159 mg/dL

## 2018-06-18 LAB — TSH: TSH: 0.21 u[IU]/mL — AB (ref 0.35–4.50)

## 2018-06-18 NOTE — Patient Instructions (Signed)
Leslie Wong , Thank you for taking time to come for your Medicare Wellness Visit. I appreciate your ongoing commitment to your health goals. Please review the following plan we discussed and let me know if I can assist you in the future.   These are the goals we discussed: Goals    . Patient Stated     Starting 06/18/2018, I will continue to take medications as prescribed.        This is a list of the screening recommended for you and due dates:  Health Maintenance  Topic Date Due  . Flu Shot  07/26/2018  . Mammogram  06/28/2019  . Colon Cancer Screening  02/27/2020  . Tetanus Vaccine  06/23/2027  .  Hepatitis C: One time screening is recommended by Center for Disease Control  (CDC) for  adults born from [birth year] through 1963.   Completed  . HIV Screening  Completed   Preventive Care for Adults  A healthy lifestyle and preventive care can promote health and wellness. Preventive health guidelines for adults include the following key practices.  . A routine yearly physical is a good way to check with your health care provider about your health and preventive screening. It is a chance to share any concerns and updates on your health and to receive a thorough exam.  . Visit your dentist for a routine exam and preventive care every 6 months. Brush your teeth twice a day and floss once a day. Good oral hygiene prevents tooth decay and gum disease.  . The frequency of eye exams is based on your age, health, family medical history, use  of contact lenses, and other factors. Follow your health care provider's recommendations for frequency of eye exams.  . Eat a healthy diet. Foods like vegetables, fruits, whole grains, low-fat dairy products, and lean protein foods contain the nutrients you need without too many calories. Decrease your intake of foods high in solid fats, added sugars, and salt. Eat the right amount of calories for you. Get information about a proper diet from your health care  provider, if necessary.  . Regular physical exercise is one of the most important things you can do for your health. Most adults should get at least 150 minutes of moderate-intensity exercise (any activity that increases your heart rate and causes you to sweat) each week. In addition, most adults need muscle-strengthening exercises on 2 or more days a week.  Silver Sneakers may be a benefit available to you. To determine eligibility, you may visit the website: www.silversneakers.com or contact program at [phone] Mon-Fri between 8AM-8PM.   . Maintain a healthy weight. The body mass index (BMI) is a screening tool to identify possible weight problems. It provides an estimate of body fat based on height and weight. Your health care provider can find your BMI and can help you achieve or maintain a healthy weight.   For adults 20 years and older: ? A BMI below 18.5 is considered underweight. ? A BMI of 18.5 to 24.9 is normal. ? A BMI of 25 to 29.9 is considered overweight. ? A BMI of 30 and above is considered obese.   . Maintain normal blood lipids and cholesterol levels by exercising and minimizing your intake of saturated fat. Eat a balanced diet with plenty of fruit and vegetables. Blood tests for lipids and cholesterol should begin at age 11 and be repeated every 5 years. If your lipid or cholesterol levels are high, you are over 50, or you  are at high risk for heart disease, you may need your cholesterol levels checked more frequently. Ongoing high lipid and cholesterol levels should be treated with medicines if diet and exercise are not working.  . If you smoke, find out from your health care provider how to quit. If you do not use tobacco, please do not start.  . If you choose to drink alcohol, please do not consume more than 2 drinks per day. One drink is considered to be 12 ounces (355 mL) of beer, 5 ounces (148 mL) of wine, or 1.5 ounces (44 mL) of liquor.  . If you are 98-79 years  old, ask your health care provider if you should take aspirin to prevent strokes.  . Use sunscreen. Apply sunscreen liberally and repeatedly throughout the day. You should seek shade when your shadow is shorter than you. Protect yourself by wearing long sleeves, pants, a wide-brimmed hat, and sunglasses year round, whenever you are outdoors.  . Once a month, do a whole body skin exam, using a mirror to look at the skin on your back. Tell your health care provider of new moles, moles that have irregular borders, moles that are larger than a pencil eraser, or moles that have changed in shape or color.

## 2018-06-20 DIAGNOSIS — M545 Low back pain: Secondary | ICD-10-CM | POA: Diagnosis not present

## 2018-06-20 DIAGNOSIS — M1288 Other specific arthropathies, not elsewhere classified, other specified site: Secondary | ICD-10-CM | POA: Diagnosis not present

## 2018-06-20 DIAGNOSIS — M5136 Other intervertebral disc degeneration, lumbar region: Secondary | ICD-10-CM | POA: Diagnosis not present

## 2018-06-20 DIAGNOSIS — G894 Chronic pain syndrome: Secondary | ICD-10-CM | POA: Diagnosis not present

## 2018-06-20 DIAGNOSIS — M25561 Pain in right knee: Secondary | ICD-10-CM | POA: Diagnosis not present

## 2018-06-20 DIAGNOSIS — Z720 Tobacco use: Secondary | ICD-10-CM | POA: Diagnosis not present

## 2018-06-20 DIAGNOSIS — Z716 Tobacco abuse counseling: Secondary | ICD-10-CM | POA: Diagnosis not present

## 2018-06-20 DIAGNOSIS — Z87891 Personal history of nicotine dependence: Secondary | ICD-10-CM | POA: Diagnosis not present

## 2018-06-20 DIAGNOSIS — F17200 Nicotine dependence, unspecified, uncomplicated: Secondary | ICD-10-CM | POA: Diagnosis not present

## 2018-06-20 NOTE — Progress Notes (Signed)
PCP notes:   Health maintenance:  No gaps identified.  Abnormal screenings:   Hearing - failed  Hearing Screening   125Hz  250Hz  500Hz  1000Hz  2000Hz  3000Hz  4000Hz  6000Hz  8000Hz   Right ear:   0 0 40  0    Left ear:   0 0 40  0     Depression score: 11 Depression screen [facility] 2/9 06/18/2018 06/16/2017  Decreased Interest 2 0  Down, Depressed, Hopeless 1 0  PHQ - 2 Score 3 0  Altered sleeping 2 -  Tired, decreased energy 2 -  Change in appetite 1 -  Feeling bad or failure about yourself  1 -  Trouble concentrating 1 -  Moving slowly or fidgety/restless 1 -  Suicidal thoughts 0 -  PHQ-9 Score 11 -  Difficult doing work/chores Somewhat difficult -   Patient concerns:   None  Nurse concerns:  None  Next PCP appt:   06/25/18 @ 0945

## 2018-06-20 NOTE — Progress Notes (Signed)
I reviewed health advisor's note, was available for consultation, and agree with documentation and plan.  

## 2018-06-20 NOTE — Progress Notes (Signed)
Subjective:   Leslie Wong is a 62 y.o. female who presents for Medicare Annual (Subsequent) preventive examination.  Review of Systems:  N/A Cardiac Risk Factors include: advanced age (>3men, >57 women);dyslipidemia     Objective:     Vitals: BP 122/78 (BP Location: Right Arm, Patient Position: Sitting, Cuff Size: Normal)   Pulse (!) 103   Temp 99.1 F (37.3 C) (Oral)   Ht 5' 0.5" (1.537 m) Comment: shoes  Wt 120 lb 8 oz (54.7 kg)   SpO2 94%   BMI 23.15 kg/m   Body mass index is 23.15 kg/m.  Advanced Directives 06/18/2018 03/21/2018 02/23/2018 06/16/2017 07/16/2015  Does Patient Have a Medical Advance Directive? No No No No No  Would patient like information on creating a medical advance directive? No - Patient declined No - Patient declined No - Patient declined - No - patient declined information    Tobacco Social History   Tobacco Use  Smoking Status Former Smoker  . Packs/day: 1.00  . Years: 42.00  . Pack years: 42.00  . Types: Cigarettes  Smokeless Tobacco Former Engineer, structural given: No   Clinical Intake:  Pre-visit preparation completed: Yes  Pain Score: 9      Nutritional Status: BMI of 19-24  Normal Nutritional Risks: None Diabetes: No  How often do you need to have someone help you when you read instructions, pamphlets, or other written materials from your doctor or pharmacy?: 1 - Never What is the last grade level you completed in school?: 12th grade  Interpreter Needed?: No  Comments: pt is divorced and lives alone Information entered by :: LPinson, LPN  Past Medical History:  Diagnosis Date  . Back pain   . Cancer ([place])    skin  . Depression    BAD  . Gastroparesis   . GERD (gastroesophageal reflux disease)   . Hypothyroidism   . Insomnia   . Migraines   . Parkinson disease ([place])    per [facility], dx'd 2019  . Recurrent cold sores   . Shoulder bursitis   . Urge incontinence    Past Surgical History:    Procedure Laterality Date  . ABDOMINAL HYSTERECTOMY  1999   total  . APPENDECTOMY    . ESOPHAGOGASTRODUODENOSCOPY  01/2006   negative except small hiatal hernia  . ESOPHAGOGASTRODUODENOSCOPY  02/24/2015   See report  . LAPAROSCOPY     for endometriosis  . OVARIAN CYST REMOVAL  1990   unilateral  . TONSILLECTOMY AND ADENOIDECTOMY     Family History  Problem Relation Age of Onset  . Hypertension Mother   . Arthritis Mother   . Diabetes Mother   . Stroke Mother   . Cancer Father        lung  . Colon cancer Neg Hx   . Breast cancer Neg Hx    Social History   Socioeconomic History  . Marital status: Divorced    Spouse name: Not on file  . Number of children: Not on file  . Years of education: Not on file  . Highest education level: Not on file  Occupational History  . Not on file  Social Needs  . Financial resource strain: Not on file  . Food insecurity:    Worry: Not on file    Inability: Not on file  . Transportation needs:    Medical: Not on file    Non-medical: Not on file  Tobacco Use  . Smoking status:  Former Smoker    Packs/day: 1.00    Years: 42.00    Pack years: 42.00    Types: Cigarettes  . Smokeless tobacco: Former Network engineer and Sexual Activity  . Alcohol use: No    Alcohol/week: 0.0 oz  . Drug use: No  . Sexual activity: Never  Lifestyle  . Physical activity:    Days per week: Not on file    Minutes per session: Not on file  . Stress: Not on file  Relationships  . Social connections:    Talks on phone: Not on file    Gets together: Not on file    Attends religious service: Not on file    Active member of club or organization: Not on file    Attends meetings of clubs or organizations: Not on file    Relationship status: Not on file  Other Topics Concern  . Not on file  Social History Narrative   Lives alone.     Has a dog names Dixie    Outpatient Encounter Medications as of 06/18/2018  Medication Sig  . albuterol (PROVENTIL  HFA;VENTOLIN HFA) 108 (90 Base) MCG/ACT inhaler Inhale 2 puffs into the lungs every 6 (six) hours as needed for wheezing or shortness of breath.  . CHANTIX 1 MG tablet TAKE 1 TABLET BY MOUTH TWICE DAILY  . cholecalciferol (VITAMIN D) 1000 units tablet Take 1 tablet (1,000 Units total) by mouth daily.  . diphenhydrAMINE (BENADRYL) 25 MG tablet Take 2 by mouth prior to Nucynta   . levothyroxine (SYNTHROID, LEVOTHROID) 88 MCG tablet TAKE 1 TABLET(88 MCG) BY MOUTH DAILY  . loratadine (CLARITIN) 10 MG tablet Take 1 tablet (10 mg total) by mouth daily.  . magic mouthwash SOLN Take 5 mLs by mouth 3 (three) times daily as needed for mouth pain.  . Multiple Vitamin (MULTIVITAMIN) tablet Take 1 tablet by mouth daily.    . nortriptyline (PAMELOR) 25 MG capsule Take 25 mg by mouth at bedtime.  . Omega-3 Fatty Acids (FISH OIL) 1000 MG CAPS Take 1 capsule (1,000 mg total) by mouth daily.  Marland Kitchen omeprazole (PRILOSEC) 20 MG capsule TAKE 1 CAPSULE BY MOUTH EVERY MORNING 45 MINUTES BEFORE BREAKFAST  . ondansetron (ZOFRAN-ODT) 4 MG disintegrating tablet DISSOLVE 1 TABLET BY MOUTH AS NEEDED  . OVER THE COUNTER MEDICATION Black Kohash daily for menopausal symptoms.  . Oxybutynin Chloride (GELNIQUE) 10 % GEL Place onto the skin daily.    Marland Kitchen oxyCODONE (OXY IR/ROXICODONE) 5 MG immediate release tablet Take 5 mg by mouth every 4 (four) hours as needed for severe pain.  Marland Kitchen propranolol (INDERAL) 10 MG tablet Take 10 mg by mouth 3 (three) times daily.  . sodium chloride (OCEAN) 0.65 % SOLN nasal spray Place 2 sprays into the nose every 6 (six) hours as needed for congestion.  Marland Kitchen Spacer/Aero Chamber Mouthpiece MISC 1 Units by Does not apply route every 4 (four) hours as needed (wheezing).  . triamcinolone (KENALOG) 0.025 % cream Apply to affected areas two to three times per week for itching only.  . valACYclovir (VALTREX) 500 MG tablet Take 1 tablet (500 mg total) by mouth 2 (two) times daily.  . varenicline (CHANTIX) 1 MG tablet  Take 1 mg by mouth 2 (two) times daily.  . vitamin E 200 UNIT capsule Take 200 Units by mouth daily.     No facility-administered encounter medications on file as of 06/18/2018.     Activities of Daily Living In your present state of health, do  you have any difficulty performing the following activities: 06/18/2018  Hearing? N  Vision? Y  Difficulty concentrating or making decisions? N  Walking or climbing stairs? N  Dressing or bathing? N  Doing errands, shopping? Y  Preparing Food and eating ? N  Using the Toilet? N  In the past six months, have you accidently leaked urine? N  Do you have problems with loss of bowel control? N  Managing your Medications? N  Managing your Finances? N  Housekeeping or managing your Housekeeping? N  Some recent data might be hidden    Patient Care Team: Tonia Ghent, MD as PCP - General (Family Medicine) Leandrew Koyanagi, MD as Referring Physician (Ophthalmology)    Assessment:   This is a routine wellness examination for Amere.   Hearing Screening   125Hz  250Hz  500Hz  1000Hz  2000Hz  3000Hz  4000Hz  6000Hz  8000Hz   Right ear:   0 0 40  0    Left ear:   0 0 40  0    Vision Screening Comments: Vision exam on 06/13/18 @ [place] and Dietary recommendations Current Exercise Habits: The patient does not participate in regular exercise at present, Exercise limited by: None identified  Goals    . Patient Stated     Starting 06/18/2018, I will continue to take medications as prescribed.        Fall Risk Fall Risk  06/18/2018 06/16/2017  Falls in the past year? No No   Depression Screen PHQ 2/9 Scores 06/18/2018 06/16/2017  PHQ - 2 Score 3 0  PHQ- 9 Score 11 -     Cognitive Function MMSE - Mini Mental State Exam 06/18/2018 06/16/2017  Orientation to time 5 5  Orientation to Place 5 5  Registration 3 3  Attention/ Calculation 0 0  Recall 3 3  Language- name 2 objects 0 0  Language- repeat 1 1    Language- follow 3 step command 3 3  Language- read & follow direction 0 0  Write a sentence 0 0  Copy design 0 0  Total score 20 20     PLEASE NOTE: A Mini-Cog screen was completed. Maximum score is 20. A value of 0 denotes this part of Folstein MMSE was not completed or the patient failed this part of the Mini-Cog screening.   Mini-Cog Screening Orientation to Time - Max 5 pts Orientation to Place - Max 5 pts Registration - Max 3 pts Recall - Max 3 pts Language Repeat - Max 1 pts Language Follow 3 Step Command - Max 3 pts     Immunization History  Administered Date(s) Administered  . Influenza Whole 09/17/2010  . Influenza-Unspecified 09/25/2016  . Td 12/26/1994, 10/31/2007  . Tdap 06/22/2017    Screening Tests Health Maintenance  Topic Date Due  . INFLUENZA VACCINE  07/26/2018  . MAMMOGRAM  06/28/2019  . COLONOSCOPY  02/27/2020  . TETANUS/TDAP  06/23/2027  . Hepatitis C Screening  Completed  . HIV Screening  Completed       Plan:     I have personally reviewed, addressed, and noted the following in the patient's chart:  A. Medical and social history B. Use of alcohol, tobacco or illicit drugs  C. Current medications and supplements D. Functional ability and status E.  Nutritional status F.  Physical activity G. Advance directives H. List of other physicians I.  Hospitalizations, surgeries, and ER visits in previous 12 months J.  Vitals K. Screenings to include hearing,  vision, cognitive, depression L. Referrals and appointments - none  In addition, I have reviewed and discussed with patient certain preventive protocols, quality metrics, and best practice recommendations. A written personalized care plan for preventive services as well as general preventive health recommendations were provided to patient.  See attached scanned questionnaire for additional information.   Signed,   Lindell Noe, MHA, BS, LPN Health Coach

## 2018-06-25 ENCOUNTER — Telehealth: Payer: Self-pay | Admitting: Family Medicine

## 2018-06-25 ENCOUNTER — Encounter: Payer: Medicare Other | Admitting: Family Medicine

## 2018-06-25 NOTE — Telephone Encounter (Signed)
Pt last seen for CPX on 06/22/17 and pt cancelled the CPX scheduled on 06/18/18. Please advise.

## 2018-06-25 NOTE — Telephone Encounter (Signed)
Copied from [place] [id number]. Topic: Quick Communication - See Telephone Encounter >> Jun 25, 2018  8:52 AM Gardiner Ramus wrote: CRM for notification. See Telephone encounter for: 06/25/18. Pt called and asked if should wait to schedule her cpe until after her eye doctor appointment. She states that she has been having trouble seeing when she breaks out in a sweat. Pt states that the sweating is due from cymbalta. Please advise. [phone]

## 2018-06-26 NOTE — Telephone Encounter (Signed)
Patient is calling [place] back and is is inquiring about the telephone encounter that Janett Billow placed in regards to if she needed the physical before or after her eye exam. Patient requesting a call back from [place].   [phone]

## 2018-06-26 NOTE — Telephone Encounter (Signed)
Left detailed message on voicemail.  

## 2018-06-26 NOTE — Telephone Encounter (Signed)
Please reschedule when possible.  Thanks.

## 2018-06-26 NOTE — Telephone Encounter (Signed)
Patient is asking if she needs the CPE before or after her eye exam?

## 2018-06-26 NOTE — Telephone Encounter (Signed)
Called and left VM for pt to return call to office.  

## 2018-06-27 NOTE — Telephone Encounter (Signed)
Okay to schedule here after the eye exam.  I apologize for not making that clear.  Thanks.

## 2018-06-27 NOTE — Telephone Encounter (Signed)
Patient notified as instructed by telephone and verbalized understanding. Patient will call back and schedule the CPE.

## 2018-07-03 ENCOUNTER — Ambulatory Visit: Payer: Medicare Other

## 2018-07-12 ENCOUNTER — Other Ambulatory Visit: Payer: Self-pay | Admitting: Family Medicine

## 2018-07-12 NOTE — Telephone Encounter (Signed)
Electronic refill request. Chantix 1 mg Last office visit:   05/10/18 Last Filled:    60 tablet 1 05/15/2018  Please advise.

## 2018-07-13 NOTE — Telephone Encounter (Signed)
Sent. Thanks.   

## 2018-07-15 ENCOUNTER — Telehealth: Payer: Self-pay | Admitting: Family Medicine

## 2018-07-15 DIAGNOSIS — E039 Hypothyroidism, unspecified: Secondary | ICD-10-CM

## 2018-07-15 NOTE — Telephone Encounter (Signed)
Please check to see when patient is going to f/u here.  Her prev TSH was minimally low. She may not end up needing a dose change but I want to recheck it in about 6 weeks.  I put in the f/u TSH order.  Could have lab visit even if she couldn't come in for OV.  Thanks.

## 2018-07-16 NOTE — Telephone Encounter (Addendum)
Patient advised.

## 2018-07-23 ENCOUNTER — Other Ambulatory Visit: Payer: Self-pay | Admitting: Family Medicine

## 2018-07-23 NOTE — Telephone Encounter (Signed)
Electronic refill request. Valcyclovir Last office visit:   05/10/18 Last Filled:    30 tablet 1 03/14/2018  Please advise.

## 2018-07-24 NOTE — Telephone Encounter (Signed)
Sent. Thanks.   

## 2018-08-01 ENCOUNTER — Other Ambulatory Visit: Payer: Self-pay | Admitting: Family Medicine

## 2018-08-10 ENCOUNTER — Telehealth: Payer: Self-pay | Admitting: Family Medicine

## 2018-08-10 MED ORDER — MAGIC MOUTHWASH: 5.0000 mL | Freq: Three times a day (TID) | ORAL | 0 refills | Status: DC | PRN

## 2018-08-10 NOTE — Telephone Encounter (Signed)
Left detailed message on voicemail.  

## 2018-08-10 NOTE — Telephone Encounter (Signed)
Refill faxed.  Would not use more than 3 times in a day.  Please leave her a message to have her call the triage line or get seen over the weekend if needed.  Thanks.

## 2018-08-10 NOTE — Telephone Encounter (Signed)
Copied from [place] [id number]. Topic: Quick Communication - Rx Refill/Question >> Aug 10, 2018  3:53 PM Margot Ables wrote: Medication: dukes magic mouthwash per pt She states having sores in her mouth that are yellowish - states using 1/2 bottle in 1 day - use [facility] for this RX only  Has the patient contacted their pharmacy? Yes - no refills and they don't know ingredients to use Preferred Pharmacy (with phone number or street name): [place], [place] [phone] (Phone) [phone] (Fax)

## 2018-08-10 NOTE — Telephone Encounter (Signed)
Unable to reach pt for more information.

## 2018-08-13 ENCOUNTER — Ambulatory Visit (INDEPENDENT_AMBULATORY_CARE_PROVIDER_SITE_OTHER): Payer: Medicare Other | Admitting: Family Medicine

## 2018-08-13 ENCOUNTER — Encounter: Payer: Self-pay | Admitting: Family Medicine

## 2018-08-13 VITALS — BP 124/72 | HR 93 | Temp 98.8°F | Ht 60.5 in | Wt 115.0 lb

## 2018-08-13 DIAGNOSIS — L570 Actinic keratosis: Secondary | ICD-10-CM | POA: Diagnosis not present

## 2018-08-13 DIAGNOSIS — L821 Other seborrheic keratosis: Secondary | ICD-10-CM | POA: Diagnosis not present

## 2018-08-13 DIAGNOSIS — R61 Generalized hyperhidrosis: Secondary | ICD-10-CM

## 2018-08-13 DIAGNOSIS — D229 Melanocytic nevi, unspecified: Secondary | ICD-10-CM | POA: Diagnosis not present

## 2018-08-13 DIAGNOSIS — L299 Pruritus, unspecified: Secondary | ICD-10-CM | POA: Diagnosis not present

## 2018-08-13 DIAGNOSIS — R634 Abnormal weight loss: Secondary | ICD-10-CM | POA: Diagnosis not present

## 2018-08-13 DIAGNOSIS — D239 Other benign neoplasm of skin, unspecified: Secondary | ICD-10-CM | POA: Diagnosis not present

## 2018-08-13 DIAGNOSIS — K121 Other forms of stomatitis: Secondary | ICD-10-CM

## 2018-08-13 MED ORDER — TRIAMCINOLONE ACETONIDE 0.1 % EX CREA: 1.0000 "application " | TOPICAL_CREAM | Freq: Two times a day (BID) | CUTANEOUS | 0 refills | Status: DC

## 2018-08-13 NOTE — Progress Notes (Signed)
Sores in the mouth, and nose. Mouthwash helped a little, for about 6 hours.  Then would have more burning in the mouth.  No clear trigger.  Going on for a few days.  No new soaps, triggers, detergents.    Itching diffusely.  Going on for a 2 days.    She has noted some finger skin cracking, near the nails.  Lower eyelids are puffy, possibly from fatigue and sleeping poorly recently.  Sweats less often, twice in the last 6 weeks. She thought the cymbalta triggered that symptom.  Her symptoms are gradually improved as she has been off Cymbalta for a longer period of time. Weight loss noted. Unclear if back to normal after weight gain with other meds vs another issue.   D/w pt.    Rhinorrhea w/o relief from benadryl or claritin.    PMH and SH reviewed  ROS: Per HPI unless specifically indicated in ROS section   Meds, vitals, and allergies reviewed.   GEN: nad, alert and oriented HEENT: mucous membranes moist, viral stigmata on the soft palate w/o ulceration now.  It sounds like she prev had aphthous stomatitis.  No thrush.  No ulceration now. NECK: supple w/o LA CV: rrr PULM: ctab, no inc wob ABD: soft, +bs EXT: no edema SKIN: She has some dry skin with finger cracking near the nails.

## 2018-08-13 NOTE — Patient Instructions (Addendum)
Go to the lab on the way out.  We'll contact you with your lab report. Use Eucerin cream for the itching.  If needed, then use triamcinolone cream on the itchy spots.  Use a small amount as needed.  Keep using the mouthwash and gargle with salt water.  If you have another oral ulcer, then you can use oragel or a small amount of triamcinolone locally.  Update me as needed, especially if the weight loss continues.  Take care.  Glad to see you.

## 2018-08-14 LAB — CBC WITH DIFFERENTIAL/PLATELET
BASOS PCT: 1.1 % (ref 0.0–3.0)
Basophils Absolute: 0.1 10*3/uL (ref 0.0–0.1)
EOS ABS: 0.2 10*3/uL (ref 0.0–0.7)
Eosinophils Relative: 2.1 % (ref 0.0–5.0)
HEMATOCRIT: 42.5 % (ref 36.0–46.0)
HEMOGLOBIN: 14.1 g/dL (ref 12.0–15.0)
LYMPHS PCT: 37.4 % (ref 12.0–46.0)
Lymphs Abs: 4.2 10*3/uL — ABNORMAL HIGH (ref 0.7–4.0)
MCHC: 33.3 g/dL (ref 30.0–36.0)
MCV: 93.5 fl (ref 78.0–100.0)
Monocytes Absolute: 1 10*3/uL (ref 0.1–1.0)
Monocytes Relative: 9 % (ref 3.0–12.0)
Neutro Abs: 5.7 10*3/uL (ref 1.4–7.7)
Neutrophils Relative %: 50.4 % (ref 43.0–77.0)
Platelets: 268 10*3/uL (ref 150.0–400.0)
RBC: 4.55 Mil/uL (ref 3.87–5.11)
RDW: 13.4 % (ref 11.5–15.5)
WBC: 11.3 10*3/uL — AB (ref 4.0–10.5)

## 2018-08-14 LAB — TSH: TSH: 0.36 u[IU]/mL (ref 0.35–4.50)

## 2018-08-14 LAB — COMPREHENSIVE METABOLIC PANEL
ALBUMIN: 4.4 g/dL (ref 3.5–5.2)
ALK PHOS: 105 U/L (ref 39–117)
ALT: 29 U/L (ref 0–35)
AST: 40 U/L — ABNORMAL HIGH (ref 0–37)
BILIRUBIN TOTAL: 0.3 mg/dL (ref 0.2–1.2)
BUN: 9 mg/dL (ref 6–23)
CALCIUM: 10.9 mg/dL — AB (ref 8.4–10.5)
CO2: 28 mEq/L (ref 19–32)
CREATININE: 0.94 mg/dL (ref 0.40–1.20)
Chloride: 104 mEq/L (ref 96–112)
GFR: 64.62 mL/min (ref >60.00)
Glucose, Bld: 110 mg/dL — ABNORMAL HIGH (ref 70–99)
Potassium: 3.5 mEq/L (ref 3.5–5.1)
Sodium: 139 mEq/L (ref 135–145)
Total Protein: 7.6 g/dL (ref 6.0–8.3)

## 2018-08-15 ENCOUNTER — Encounter: Payer: Self-pay | Admitting: Family Medicine

## 2018-08-15 DIAGNOSIS — L299 Pruritus, unspecified: Secondary | ICD-10-CM | POA: Insufficient documentation

## 2018-08-15 DIAGNOSIS — K121 Other forms of stomatitis: Secondary | ICD-10-CM | POA: Insufficient documentation

## 2018-08-15 NOTE — Assessment & Plan Note (Signed)
History of per patient report but resolved now.  She does have stigmata of viral infection on the posterior portion of the oropharynx, along with soft palate.  Local irritation noted but no exudates.  No ulceration otherwise.  She could have had a viral stomatitis that could be causing other symptoms as described above.  At this point still okay for outpatient follow-up.  Use Magic mouthwash.  If she has a new ulcer she can use triamcinolone on that locally.  She does have some incidental cracking of skin near the external rim of the nostrils but no ulceration or nasal mass noted otherwise.  Still okay for outpatient follow-up.  She agrees.  See after visit summary. >25 minutes spent in face to face time with patient, >50% spent in counselling or coordination of care.

## 2018-08-15 NOTE — Assessment & Plan Note (Signed)
Okay to use triamcinolone as needed.  Discussed with patient.  She agrees.

## 2018-08-15 NOTE — Assessment & Plan Note (Signed)
I think she likely had symptoms related to Cymbalta use.  She has had a low sweats before that she has gone a long without Cymbalta.  Unclear to me if the weight loss is a true issue or if she is just returning to normal after previous weight gain that could have been med related.  Discussed with patient.  She understood.  Still okay for outpatient follow-up.  Check routine labs.  See notes on labs.  She agrees.

## 2018-08-17 ENCOUNTER — Other Ambulatory Visit: Payer: Self-pay | Admitting: Family Medicine

## 2018-08-17 NOTE — Telephone Encounter (Signed)
Electronic refill request. Chantix Last office visit:   08/13/18 Last Filled:    60 tablet 0 07/13/2018  Please advise.

## 2018-08-19 NOTE — Telephone Encounter (Signed)
Sent. Thanks.   

## 2018-08-21 DIAGNOSIS — H2513 Age-related nuclear cataract, bilateral: Secondary | ICD-10-CM | POA: Diagnosis not present

## 2018-08-23 ENCOUNTER — Other Ambulatory Visit: Payer: Self-pay | Admitting: Family Medicine

## 2018-08-23 NOTE — Telephone Encounter (Signed)
Electronic refill request. Valacyclovir Last office visit:   08/13/18 Last Filled:    30 tablet 1 07/24/2018  Please advise.

## 2018-08-24 NOTE — Telephone Encounter (Signed)
Sent. Thanks.   

## 2018-08-28 ENCOUNTER — Emergency Department (EMERGENCY_DEPARTMENT_HOSPITAL)
Admission: EM | Admit: 2018-08-28 | Discharge: 2018-08-28 | Disposition: A | Discharge status: Other Acute Inpatient Hospital | Payer: Medicare Other | Source: Home / Self Care | Attending: Emergency Medicine | Admitting: Emergency Medicine

## 2018-08-28 ENCOUNTER — Other Ambulatory Visit: Payer: Self-pay

## 2018-08-28 ENCOUNTER — Inpatient Hospital Stay
Admission: AD | Admit: 2018-08-28 | Discharge: 2018-09-04 | DRG: 885 | Disposition: A | Discharge status: Home / Self Care | Payer: Medicare Other | Source: Intra-hospital | Attending: Psychiatry | Admitting: Psychiatry

## 2018-08-28 ENCOUNTER — Ambulatory Visit: Payer: Self-pay | Admitting: *Deleted

## 2018-08-28 DIAGNOSIS — G20 Parkinson's disease: Secondary | ICD-10-CM | POA: Diagnosis present

## 2018-08-28 DIAGNOSIS — K449 Diaphragmatic hernia without obstruction or gangrene: Secondary | ICD-10-CM

## 2018-08-28 DIAGNOSIS — Z9114 Patient's other noncompliance with medication regimen: Secondary | ICD-10-CM

## 2018-08-28 DIAGNOSIS — Z85828 Personal history of other malignant neoplasm of skin: Secondary | ICD-10-CM

## 2018-08-28 DIAGNOSIS — Z79890 Hormone replacement therapy (postmenopausal): Secondary | ICD-10-CM | POA: Diagnosis not present

## 2018-08-28 DIAGNOSIS — E039 Hypothyroidism, unspecified: Secondary | ICD-10-CM | POA: Diagnosis present

## 2018-08-28 DIAGNOSIS — Z79899 Other long term (current) drug therapy: Secondary | ICD-10-CM | POA: Diagnosis not present

## 2018-08-28 DIAGNOSIS — I10 Essential (primary) hypertension: Secondary | ICD-10-CM | POA: Diagnosis not present

## 2018-08-28 DIAGNOSIS — Z87891 Personal history of nicotine dependence: Secondary | ICD-10-CM

## 2018-08-28 DIAGNOSIS — F312 Bipolar disorder, current episode manic severe with psychotic features: Secondary | ICD-10-CM | POA: Diagnosis not present

## 2018-08-28 DIAGNOSIS — K219 Gastro-esophageal reflux disease without esophagitis: Secondary | ICD-10-CM

## 2018-08-28 DIAGNOSIS — G8929 Other chronic pain: Secondary | ICD-10-CM | POA: Diagnosis present

## 2018-08-28 DIAGNOSIS — J449 Chronic obstructive pulmonary disease, unspecified: Secondary | ICD-10-CM | POA: Diagnosis present

## 2018-08-28 DIAGNOSIS — F23 Brief psychotic disorder: Secondary | ICD-10-CM

## 2018-08-28 DIAGNOSIS — Z79891 Long term (current) use of opiate analgesic: Secondary | ICD-10-CM | POA: Diagnosis not present

## 2018-08-28 DIAGNOSIS — E509 Vitamin A deficiency, unspecified: Secondary | ICD-10-CM | POA: Diagnosis present

## 2018-08-28 DIAGNOSIS — E560 Deficiency of vitamin E: Secondary | ICD-10-CM | POA: Diagnosis present

## 2018-08-28 DIAGNOSIS — G20A1 Parkinson's disease without dyskinesia, without mention of fluctuations: Secondary | ICD-10-CM | POA: Diagnosis present

## 2018-08-28 DIAGNOSIS — E785 Hyperlipidemia, unspecified: Secondary | ICD-10-CM | POA: Diagnosis present

## 2018-08-28 DIAGNOSIS — G4700 Insomnia, unspecified: Secondary | ICD-10-CM | POA: Diagnosis present

## 2018-08-28 DIAGNOSIS — R451 Restlessness and agitation: Secondary | ICD-10-CM | POA: Diagnosis not present

## 2018-08-28 LAB — CBC WITH DIFFERENTIAL/PLATELET
BASOS ABS: 0.1 10*3/uL (ref 0–0.1)
BASOS PCT: 1 %
EOS ABS: 0.1 10*3/uL (ref 0–0.7)
EOS PCT: 2 %
HCT: 40.9 % (ref 35.0–47.0)
Hemoglobin: 14 g/dL (ref 12.0–16.0)
LYMPHS PCT: 23 %
Lymphs Abs: 2.1 10*3/uL (ref 1.0–3.6)
MCH: 32.1 pg (ref 26.0–34.0)
MCHC: 34.2 g/dL (ref 32.0–36.0)
MCV: 93.7 fL (ref 80.0–100.0)
MONO ABS: 0.7 10*3/uL (ref 0.2–0.9)
Monocytes Relative: 8 %
Neutro Abs: 6 10*3/uL (ref 1.4–6.5)
Neutrophils Relative %: 66 %
PLATELETS: 320 10*3/uL (ref 150–440)
RBC: 4.36 MIL/uL (ref 3.80–5.20)
RDW: 14.8 % — AB (ref 11.5–14.5)
WBC: 9 10*3/uL (ref 3.6–11.0)

## 2018-08-28 LAB — COMPREHENSIVE METABOLIC PANEL
ALT: 70 U/L — AB (ref 0–44)
AST: 66 U/L — AB (ref 15–41)
Albumin: 3.9 g/dL (ref 3.5–5.0)
Alkaline Phosphatase: 77 U/L (ref 38–126)
Anion gap: 7 (ref 5–15)
BUN: 13 mg/dL (ref 6–20)
CHLORIDE: 103 mmol/L (ref 98–111)
CO2: 29 mmol/L (ref 22–32)
Calcium: 10.1 mg/dL (ref 8.9–10.3)
Creatinine, Ser: 0.62 mg/dL (ref 0.44–1.00)
GFR calc Af Amer: >60 mL/min (ref >60)
Glucose, Bld: 121 mg/dL — ABNORMAL HIGH (ref 70–99)
Potassium: 3 mmol/L — ABNORMAL LOW (ref 3.5–5.1)
SODIUM: 139 mmol/L (ref 135–145)
Total Bilirubin: 0.7 mg/dL (ref 0.3–1.2)
Total Protein: 6.8 g/dL (ref 6.5–8.1)

## 2018-08-28 LAB — ETHANOL

## 2018-08-28 LAB — TROPONIN I

## 2018-08-28 LAB — TSH: TSH: 0.208 u[IU]/mL — ABNORMAL LOW (ref 0.350–4.500)

## 2018-08-28 MED ORDER — LORAZEPAM 2 MG PO TABS
2.0000 mg | ORAL_TABLET | Freq: Once | ORAL | Status: AC
  Administered 2018-08-28: 2 mg via ORAL
  Filled 2018-08-28: qty 1

## 2018-08-28 MED ORDER — VITAMIN D 1000 UNITS PO TABS
1000.0000 [IU] | ORAL_TABLET | Freq: Every day | ORAL | Status: DC
  Administered 2018-08-29 – 2018-09-04 (×7): 1000 [IU] via ORAL
  Filled 2018-08-28 (×7): qty 1

## 2018-08-28 MED ORDER — LEVOTHYROXINE SODIUM 88 MCG PO TABS: 88.0000 ug | ORAL_TABLET | Freq: Every day | ORAL | Status: DC

## 2018-08-28 MED ORDER — ALBUTEROL SULFATE (2.5 MG/3ML) 0.083% IN NEBU: 3.0000 mL | INHALATION_SOLUTION | Freq: Four times a day (QID) | RESPIRATORY_TRACT | Status: DC | PRN

## 2018-08-28 MED ORDER — ALUM & MAG HYDROXIDE-SIMETH 200-200-20 MG/5ML PO SUSP
30.0000 mL | ORAL | Status: DC | PRN
  Administered 2018-09-04: 30 mL via ORAL
  Filled 2018-08-28: qty 30

## 2018-08-28 MED ORDER — HYDROXYZINE HCL 50 MG PO TABS
50.0000 mg | ORAL_TABLET | Freq: Three times a day (TID) | ORAL | Status: DC | PRN
  Administered 2018-08-28 – 2018-09-02 (×8): 50 mg via ORAL
  Filled 2018-08-28 (×10): qty 1

## 2018-08-28 MED ORDER — OXYCODONE-ACETAMINOPHEN 5-325 MG PO TABS
1.0000 | ORAL_TABLET | Freq: Four times a day (QID) | ORAL | Status: DC | PRN
  Administered 2018-08-28 – 2018-09-04 (×18): 1 via ORAL
  Filled 2018-08-28 (×19): qty 1

## 2018-08-28 MED ORDER — LORAZEPAM 1 MG PO TABS
1.0000 mg | ORAL_TABLET | ORAL | Status: DC | PRN
  Administered 2018-08-28: 1 mg via ORAL
  Filled 2018-08-28: qty 1

## 2018-08-28 MED ORDER — LURASIDONE HCL 40 MG PO TABS: 40.0000 mg | ORAL_TABLET | Freq: Every day | ORAL | Status: DC

## 2018-08-28 MED ORDER — ACETAMINOPHEN 325 MG PO TABS: 650.0000 mg | ORAL_TABLET | Freq: Four times a day (QID) | ORAL | Status: DC | PRN

## 2018-08-28 MED ORDER — LORAZEPAM 1 MG PO TABS: 1.0000 mg | ORAL_TABLET | ORAL | Status: DC | PRN

## 2018-08-28 MED ORDER — MAGNESIUM HYDROXIDE 400 MG/5ML PO SUSP
30.0000 mL | Freq: Every day | ORAL | Status: DC | PRN
  Administered 2018-09-03 (×2): 30 mL via ORAL
  Filled 2018-08-28 (×2): qty 30

## 2018-08-28 MED ORDER — LURASIDONE HCL 40 MG PO TABS
40.0000 mg | ORAL_TABLET | Freq: Every day | ORAL | Status: DC
  Filled 2018-08-28: qty 1

## 2018-08-28 MED ORDER — VITAMIN D 1000 UNITS PO TABS: 1000.0000 [IU] | ORAL_TABLET | Freq: Every day | ORAL | Status: DC

## 2018-08-28 MED ORDER — LEVOTHYROXINE SODIUM 88 MCG PO TABS
88.0000 ug | ORAL_TABLET | Freq: Every day | ORAL | Status: DC
  Administered 2018-08-29 – 2018-09-04 (×7): 88 ug via ORAL
  Filled 2018-08-28 (×8): qty 1

## 2018-08-28 NOTE — ED Provider Notes (Addendum)
[facility] Emergency Department Provider Note       Time seen: ----------------------------------------- 1:35 PM on 08/28/2018 ----------------------------------------- Level V caveat: History/ROS limited by psychosis  I have reviewed the triage vital signs and the nursing notes.  HISTORY   Chief Complaint No chief complaint on file.    HPI Leslie Wong is a 62 y.o. female with a history of depression, gastroparesis, GERD, hypothyroidism, insomnia, migraines, Parkinson's who presents to the ED for acute psychosis.  Patient was brought in by police for bizarre behavior.  No further information is available.  Past Medical History:  Diagnosis Date  . Back pain   . Cancer ([place])    skin  . Depression    BAD  . Gastroparesis   . GERD (gastroesophageal reflux disease)   . Hypothyroidism   . Insomnia   . Migraines   . Parkinson disease ([place])    per [place] clinic, dx'd 2019  . Recurrent cold sores   . Shoulder bursitis   . Urge incontinence     Patient Active Problem List   Diagnosis Date Noted  . Itch of skin 08/15/2018  . Oral ulcer 08/15/2018  . Night sweats 05/13/2018  . Pupil asymmetry 05/13/2018  . Hx of cold sores 03/14/2018  . Parkinson disease ([place])   . Health care maintenance 06/23/2017  . HLD (hyperlipidemia) 03/10/2016  . Advance care planning 03/10/2016  . Generalized abdominal pain 02/18/2015  . Osteopenia 04/18/2014  . Medicare annual wellness visit, initial 03/18/2014  . Shoulder pain 01/17/2013  . Chronic back pain 09/14/2011  . Hypercalcemia 03/25/2010  . NAUSEA WITH VOMITING 05/12/2009  . INCONTINENCE, URGE 04/16/2009  . CARPAL TUNNEL SYNDROME, BILATERAL 09/24/2008  . Tongue irritation 09/24/2008  . URETHRAL STRICTURE 06/13/2007  . Hypothyroidism 06/05/2007  . DISORDER, BIPOLAR NOS 06/05/2007  . MIGRAINE HEADACHE 06/05/2007  . GASTROPARESIS 06/05/2007  . Gastroesophageal reflux disease with hiatal hernia  06/05/2007  . IRRITABLE BOWEL SYNDROME 06/05/2007  . FIBROCYSTIC BREAST DISEASE 06/05/2007    Past Surgical History:  Procedure Laterality Date  . ABDOMINAL HYSTERECTOMY  1999   total  . APPENDECTOMY    . ESOPHAGOGASTRODUODENOSCOPY  01/2006   negative except small hiatal hernia  . ESOPHAGOGASTRODUODENOSCOPY  02/24/2015   See report  . LAPAROSCOPY     for endometriosis  . OVARIAN CYST REMOVAL  1990   unilateral  . TONSILLECTOMY AND ADENOIDECTOMY      Allergies Cymbalta [duloxetine hcl]; Aspirin; Baclofen; Codeine; Erythromycin; Gabapentin; Metoclopramide hcl; Nsaids; Nucynta er [tapentadol hcl er]; Nucynta [tapentadol]; Prednisone; Pregabalin; Seroquel [quetiapine fumerate]; Sulfa antibiotics; Topamax; and Vicodin [hydrocodone-acetaminophen]  Social History Social History   Tobacco Use  . Smoking status: Former Smoker    Packs/day: 1.00    Years: 42.00    Pack years: 42.00    Types: Cigarettes  . Smokeless tobacco: Former Network engineer Use Topics  . Alcohol use: No    Alcohol/week: 0.0 standard drinks  . Drug use: No   Review of Systems Patient with incoherent thought, review of systems otherwise unknown All systems negative/normal/unremarkable except as stated in the HPI  EKG: Turbid by me, sinus rhythm with short PR interval, normal QRS, normal QT ____________________________________________   PHYSICAL EXAM:  VITAL SIGNS: ED Triage Vitals  Enc Vitals Group     BP      Pulse      Resp      Temp      Temp src      SpO2  Weight      Height      Head Circumference      Peak Flow      Pain Score      Pain Loc      Pain Edu?      Excl. in [place]?    Constitutional: Alert, agitated, mild distress Eyes: Conjunctivae are normal. Normal extraocular movements. ENT   Head: Normocephalic and atraumatic.   Nose: No congestion/rhinnorhea.   Mouth/Throat: Mucous membranes are moist.   Neck: No stridor. Cardiovascular: Normal rate, regular  rhythm. No murmurs, rubs, or gallops. Respiratory: Normal respiratory effort without tachypnea nor retractions. Breath sounds are clear and equal bilaterally. No wheezes/rales/rhonchi. Gastrointestinal: Soft and nontender. Normal bowel sounds Musculoskeletal: Nontender with normal range of motion in extremities. No lower extremity tenderness nor edema. Neurologic:  No gross focal neurologic deficits are appreciated.  Skin:  Skin is warm, dry and intact. No rash noted. Psychiatric: Bizarre mood and affect, speech is bizarre at this time ____________________________________________  ED COURSE:  As part of my medical decision making, I reviewed the following data within the [place] History obtained from family if available, nursing notes, old chart and ekg, as well as notes from prior ED visits. Patient presented for bizarre behavior and likely acute psychosis, we will assess with labs and consult psychiatry.   Procedures ____________________________________________   LABS (pertinent positives/negatives)  Labs Reviewed  CBC WITH DIFFERENTIAL/PLATELET  COMPREHENSIVE METABOLIC PANEL  TROPONIN I  URINALYSIS, COMPLETE (UACMP) WITH MICROSCOPIC  URINE DRUG SCREEN, QUALITATIVE ([facility] ONLY)  ETHANOL  TSH    ____________________________________________  DIFFERENTIAL DIAGNOSIS   Acute psychosis, medication side effect, intoxication  FINAL ASSESSMENT AND PLAN  Acute psychosis   Plan: The patient had presented for psychotic behavior. Patient's labs are pending at this time.  She will certainly need inpatient admission for what appears to be's acute psychosis.   Laurence Aly, MD   Note: This note was generated in part or whole with voice recognition software. Voice recognition is usually quite accurate but there are transcription errors that can and very often do occur. I apologize for any typographical errors that were not detected and corrected.      Earleen Newport, MD 08/28/18 1512    Earleen Newport, MD 08/28/18 [id number]

## 2018-08-28 NOTE — BH Assessment (Signed)
Assessment Note  Leslie Wong is an 62 y.o. female who presents to the ER due to an increase of odd and bizarre behaviors. Patient's daughter placed her under IVC because, because over the last week, they have witnessed a change in her behaviors and mood. She's been hyper-verbal and she saying things that doesn't make since. She's been hyper religious.  Patient daughter sought the advice of PCP and she was advised to have patient brought to the ER.  During the interview, the patient had to be redirected the entire time. She was having flight of ideas and majority of her answers wasn't appropriate to the question. She was pleasant and was able to be managed but difficult to get information.  Diagnosis: Bipolar.  Past Medical History:  Past Medical History:  Diagnosis Date  . Back pain   . Cancer ([place])    skin  . Depression    BAD  . Gastroparesis   . GERD (gastroesophageal reflux disease)   . Hypothyroidism   . Insomnia   . Migraines   . Parkinson disease ([place])    per [place] clinic, dx'd 2019  . Recurrent cold sores   . Shoulder bursitis   . Urge incontinence     Past Surgical History:  Procedure Laterality Date  . ABDOMINAL HYSTERECTOMY  1999   total  . APPENDECTOMY    . ESOPHAGOGASTRODUODENOSCOPY  01/2006   negative except small hiatal hernia  . ESOPHAGOGASTRODUODENOSCOPY  02/24/2015   See report  . LAPAROSCOPY     for endometriosis  . OVARIAN CYST REMOVAL  1990   unilateral  . TONSILLECTOMY AND ADENOIDECTOMY      Family History:  Family History  Problem Relation Age of Onset  . Hypertension Mother   . Arthritis Mother   . Diabetes Mother   . Stroke Mother   . Cancer Father        lung  . Colon cancer Neg Hx   . Breast cancer Neg Hx     Social History:  reports that she has quit smoking. Her smoking use included cigarettes. She has a 42.00 pack-year smoking history. She has quit using smokeless tobacco. She reports that she does not drink alcohol  or use drugs.  Additional Social History:  Alcohol / Drug Use Pain Medications: See PTA Prescriptions: See PTA Over the Counter: See PTA History of alcohol / drug use?: No history of alcohol / drug abuse Longest period of sobriety (when/how long): Report of no past or current use Negative Consequences of Use: (n/a) Withdrawal Symptoms: (n/a)  CIWA: CIWA-Ar BP: 109/89 Pulse Rate: (!) 107 COWS:    Allergies:  Allergies  Allergen Reactions  . Cymbalta [Duloxetine Hcl]   . Valproic Acid Shortness Of Breath  . Aspirin     REACTION: UNSPECIFIED  . Baclofen     REACTION: Throat swells up  . Codeine     REACTION: UNSPECIFIED  . Erythromycin     REACTION: UNSPECIFIED  . Gabapentin     REACTION: throat swells up  . Metoclopramide Hcl     REACTION: states "messed me up"  . Nsaids Other (See Comments)    Stomach problems  . Nucynta Er [Tapentadol Hcl Er]     Intolerant, see note from 07/24/12.    Gean [place] [Tapentadol]     AMS  . Prednisone     GI intolance  . Pregabalin   . Seroquel [Quetiapine Fumerate] Other (See Comments)    Loss control, felt like on another  planet  . Sulfa Antibiotics Nausea And Vomiting  . Topamax Other (See Comments)    Blisters in mouth and tongue  . Vicodin [Hydrocodone-Acetaminophen]     Nausea, thrush and constipation    Home Medications:  (Not in a hospital admission)  OB/GYN Status:  No LMP recorded. Patient has had a hysterectomy.  General Assessment Data Location of Assessment: [facility] ED TTS Assessment: In system Is this a Tele or Face-to-Face Assessment?: Face-to-Face Is this an Initial Assessment or a Re-assessment for this encounter?: Initial Assessment Patient Accompanied by:: Other(Daughter) Language Other than English: No Living Arrangements: Other (Comment)(Private Residency) What gender do you identify as?: Female Marital status: Married Pregnancy Status: No Can pt return to current living arrangement?: Yes Admission  Status: Involuntary Petitioner: Family member(Daughter) Is patient capable of signing voluntary admission?: Yes(Under IVC) Referral Source: Self/Family/Friend Insurance type: Medicare  Medical Screening Exam ([place]) Medical Exam completed: Yes  Crisis Care Plan Legal Guardian: Other:(Self) Name of Psychiatrist: Reports of none Name of Therapist: Reports of none  Education Status Is patient currently in school?: No Is the patient employed, unemployed or receiving disability?: Unemployed  Risk to self with the past 6 months Suicidal Ideation: No Has patient been a risk to self within the past 6 months prior to admission? : No Suicidal Intent: No Has patient had any suicidal intent within the past 6 months prior to admission? : No Is patient at risk for suicide?: No Suicidal Plan?: No Has patient had any suicidal plan within the past 6 months prior to admission? : No Access to Means: No What has been your use of drugs/alcohol within the last 12 months?: Reports of none Previous Attempts/Gestures: No How many times?: 0 Other Self Harm Risks: Reports of none Triggers for Past Attempts: None known Intentional Self Injurious Behavior: None Family Suicide History: No Recent stressful life event(s): Other (Comment) Persecutory voices/beliefs?: No Depression: No Depression Symptoms: Isolating Substance abuse history and/or treatment for substance abuse?: No Suicide prevention information given to non-admitted patients: Not applicable  Risk to Others within the past 6 months Homicidal Ideation: No Does patient have any lifetime risk of violence toward others beyond the six months prior to admission? : No Thoughts of Harm to Others: No Current Homicidal Intent: No Current Homicidal Plan: No Access to Homicidal Means: No Identified Victim: Reports of none History of harm to others?: No Assessment of Violence: None Noted Violent Behavior Description: Reports of  none Does patient have access to weapons?: No Criminal Charges Pending?: No Does patient have a court date: No Is patient on probation?: No  Psychosis Hallucinations: None noted Delusions: Unspecified  Mental Status Report Appearance/Hygiene: Bizarre Eye Contact: Fair Motor Activity: Freedom of movement, Restlessness Speech: Rapid, Tangential Level of Consciousness: Alert, Restless Mood: Anxious, Suspicious, Pleasant Affect: Anxious, Appropriate to circumstance Anxiety Level: Moderate Thought Processes: Irrelevant, Tangential, Flight of Ideas Judgement: Impaired Orientation: Person, Place, Time, Situation, Appropriate for developmental age Obsessive Compulsive Thoughts/Behaviors: Moderate  Cognitive Functioning Concentration: Decreased Memory: Recent Impaired, Remote Impaired Is patient IDD: No Insight: Poor Impulse Control: Poor Appetite: Fair Have you had any weight changes? : No Change Sleep: Decreased Total Hours of Sleep: 4 Vegetative Symptoms: None  ADLScreening [facility] Assessment Services) Patient's cognitive ability adequate to safely complete daily activities?: Yes Patient able to express need for assistance with ADLs?: Yes Independently performs ADLs?: Yes (appropriate for developmental age)  Prior Inpatient Therapy Prior Inpatient Therapy: Yes Prior Therapy Dates: 1999 Prior Therapy Facilty/Provider(s): [facility]  Reason for Treatment: Unknown  Prior Outpatient Therapy Prior Outpatient Therapy: No Does patient have an ACCT team?: No Does patient have Intensive In-House Services?  : No Does patient have [facility] services? : No Does patient have P4CC services?: No  ADL Screening (condition at time of admission) Patient's cognitive ability adequate to safely complete daily activities?: Yes Is the patient deaf or have difficulty hearing?: No Does the patient have difficulty seeing, even when wearing glasses/contacts?: No Does the patient have difficulty  concentrating, remembering, or making decisions?: No Patient able to express need for assistance with ADLs?: Yes Does the patient have difficulty dressing or bathing?: No Independently performs ADLs?: Yes (appropriate for developmental age) Does the patient have difficulty walking or climbing stairs?: No Weakness of Legs: None Weakness of Arms/Hands: None  Home Assistive Devices/Equipment Home Assistive Devices/Equipment: None  Therapy Consults (therapy consults require a physician order) PT Evaluation Needed: No OT Evalulation Needed: No SLP Evaluation Needed: No Abuse/Neglect Assessment (Assessment to be complete while patient is alone) Abuse/Neglect Assessment Can Be Completed: Yes Physical Abuse: Denies Verbal Abuse: Denies Sexual Abuse: Denies Exploitation of patient/patient's resources: Denies Self-Neglect: Denies Values / Beliefs Cultural Requests During Hospitalization: None Spiritual Requests During Hospitalization: None Consults Spiritual Care Consult Needed: No Social Work Consult Needed: No         Child/Adolescent Assessment Running Away Risk: Denies(Patient is an adult)  Disposition:  Disposition Initial Assessment Completed for this Encounter: Yes  On Site Evaluation by:   Reviewed with Physician:    Gunnar Fusi MS, LCAS, LPC, [place], CCSI Therapeutic Triage Specialist 08/28/2018 4:40 PM

## 2018-08-28 NOTE — Telephone Encounter (Signed)
Agree with ER.  Thanks.  

## 2018-08-28 NOTE — ED Notes (Signed)
Pt's daughter came to visit pt.  Pt told nurse that her mother has been having bizarre behaviors for one week.  Pt states that the patient has been running through the house, making competitive movements and actions.  Pt's daughter states that the patient lives alone and that she has been receiving multiple phone calls and text messages from others telling her that she needed to check on her mother.  The patient is mumbling and praying to herself currently, noted to be very hyper-religious.  Pt's daughter states that she is her mother's POA.   The patient's daughter is Rennis Golden.  Her number is [phone].

## 2018-08-28 NOTE — BH Assessment (Addendum)
Patient is to be admitted to [facility] by Dr. Weber Cooks.  Attending Physician will be Dr. Bary Leriche.   Patient has been assigned to room 307, by [facility] Charge Nurse T'Yawn.   ER staff is aware of the admission:  Jonelle Sidle, ER Dian Situ   Dr. Burlene Arnt, ER MD   Berton Lan, Patient's Nurse   Elberta Fortis, Patient Access.

## 2018-08-28 NOTE — Telephone Encounter (Signed)
Pt's daughter, Leslie Wong, called stating that pt started talking "nonsense, and not of her nature" about a week ago and has progressively worsened; she reports that the pt has not been sleeping; she also says that it is almost like she has "psychosis"; the pt does a history of schizophrenia; she would like the pt to be admitted for evaluation; additionally the neighbors have also calling her daughter because they are concerned for her safety;  Leslie Wong also said that the pt was taken all of her medication since February (wellbutrin, antidepressants, anti-anxiety); the pt's daughter said that she had an episode of this about 20 years ago; recommendations made per nurse triage protocol to include going to ED; however the pt's daughter request evaluation by Dr Damita Dunnings; Leslie Wong, the pt's daughter would like a call back from Dr Damita Dunnings; she can be contacted at [phone]; spoke with Leslie Wong and she confirmed that the pt should be evaluated in the ED; conference call intitiated with Leslie Wong at [facility], and she confirms that the pt needs to be evaluated in the ED; Leslie Wong verbalizes understanding and will have the pt taken to the ED; will route to office for notification of this encounter.    Reason for Disposition . Very strange or paranoid behavior  Answer Assessment - Initial Assessment Questions 1. LEVEL OF CONSCIOUSNESS: "How is he (she, the patient) acting right now?" (e.g., alert-oriented, confused, lethargic, stuporous, comatose)     confused 2. ONSET: "When did the confusion start?"  (minutes, hours, days)     1-2 weeks ago 3. PATTERN "Does this come and go, or has it been constant since it started?"  "Is it present now?"     constant 4. ALCOHOL or DRUGS: "Has he been drinking alcohol or taking any drugs?"      no 5. NARCOTIC MEDICATIONS: "Has he been receiving any narcotic medications?" (e.g., morphine, Vicodin)     Percocet  6. CAUSE: "What do you think is causing the confusion?"      unsure 7.  OTHER SYMPTOMS: "Are there any other symptoms?" (e.g., difficulty breathing, headache, fever, weakness)     Compulsive behaviors  Protocols used: CONFUSION - DELIRIUM-A-AH

## 2018-08-28 NOTE — ED Notes (Signed)
Spoke with Rennis Golden (daughter- Healthcare POA [phone]), to see about getting patient's bottom dentures.  She reports that patient threw her teeth around her apartment when the police came.  She said she will check patient's apartment tomorrow to see if she can find them.

## 2018-08-28 NOTE — ED Notes (Addendum)
Pt is wearing upper dentures only.

## 2018-08-28 NOTE — ED Notes (Signed)
Pt is agitated, hysterical and adamant.  Pt insists that she lost her top dentures.  Informed patient that she only had her top dentures when she arrived in the Emergency Department.

## 2018-08-28 NOTE — Consult Note (Signed)
[place] [facility] Consult   Reason for Consult: Consult for this 62 year old woman with a past history of bipolar disorder who presents to the hospital with psychotic mania Referring Physician: Jimmye Norman Patient Identification: Leslie Wong MRN:  [id number] Principal Diagnosis: Bipolar affective disorder, current episode manic [facility]) Diagnosis:   Patient Active Problem List   Diagnosis Date Noted  . Bipolar affective disorder, current episode manic ([place]) [F31.9] 08/28/2018  . Itch of skin [L29.9] 08/15/2018  . Oral ulcer [K12.1] 08/15/2018  . Night sweats [R61] 05/13/2018  . Pupil asymmetry [H57.02] 05/13/2018  . Hx of cold sores [Z86.19] 03/14/2018  . Parkinson disease ([place]) [G20]   . Health care maintenance [Z00.00] 06/23/2017  . HLD (hyperlipidemia) [E78.5] 03/10/2016  . Advance care planning [Z71.89] 03/10/2016  . Generalized abdominal pain [R10.84] 02/18/2015  . Osteopenia [M85.80] 04/18/2014  . Medicare annual wellness visit, initial [Z00.00] 03/18/2014  . Shoulder pain [M25.519] 01/17/2013  . Chronic back pain [M54.9, G89.29] 09/14/2011  . Hypercalcemia [E83.52] 03/25/2010  . NAUSEA WITH VOMITING [R11.2] 05/12/2009  . INCONTINENCE, URGE [N39.41] 04/16/2009  . CARPAL TUNNEL SYNDROME, BILATERAL [G56.00] 09/24/2008  . Tongue irritation [K14.9] 09/24/2008  . URETHRAL STRICTURE [IMO0002] 06/13/2007  . Hypothyroidism [E03.9] 06/05/2007  . DISORDER, BIPOLAR NOS [F31.9] 06/05/2007  . MIGRAINE HEADACHE [G43.909] 06/05/2007  . GASTROPARESIS [K31.84] 06/05/2007  . Gastroesophageal reflux disease with hiatal hernia [K21.9, K44.9] 06/05/2007  . IRRITABLE BOWEL SYNDROME [K58.9] 06/05/2007  . FIBROCYSTIC BREAST DISEASE [N60.19] 06/05/2007    Total Time spent with patient: 1 hour  Subjective:   Leslie Wong is a 62 y.o. female patient admitted with "my name is Leslie Wong and I am a white female!".  HPI: Patient seen chart reviewed.  This  is a patient who has a past history of a diagnosis of bipolar disorder who was brought to the hospital at the recommendation of her primary care doctor after family discovered that she has been acting strangely.  According to the chart the daughter has been complaining that the patient has been acting not like herself and has been talking "out of her head" for at least several days.  On interview today the patient was grossly disorganized and agitated with rapid speech pressured speech flight of ideas labile mood with inappropriate laughter.  She denies suicidal or homicidal thoughts.  Has not been physically aggressive.  Patient is a very difficult historian.  Almost every question I ask her she gets overwhelmed by her racing thoughts and has to start all over again by telling me her name.  Patient says that she has been seeing a psychiatrist at [facility] but apparently last saw other psychiatrist in March because it was a resident.  Not entirely clear what if any psychiatric medicine she is currently taking.  Patient is not able to articulate any specific stressor.  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  Apparently her psychiatrist at [facility] had referred her to see a neurologist out of concern that some of her tremor might be Parkinson's disease rather than tardive dyskinesia.  Based on some very mild symptoms and a DAT scan the neurologist diagnosed her with Parkinson's disease but the patient did not want to start any medicine.  Social history: Patient evidently lives on her own with her dog and may have been.  Has extended family around.  Daughter appears to be the one currently most closely involved.  Substance  abuse history: Denies any history of alcohol or drug abuse  Past Psychiatric History: Patient last had a hospitalization for psychiatric symptoms in about 1999.  Of course those  records are no longer available to [name] in the computer.  Patient carries a long-standing diagnosis of bipolar disorder.  I was able to see some notes from her treatment at [facility] that indicate that she had been on multiple medicines in the past and has a wide array of side effects she has complained of many of the very atypical for the medicines involved.  I found this list in 1 of the neurology notes.  It includes a mention that Seroquel made her feel "in another world" and that many other antipsychotics and mood stabilizers were difficult to tolerate.  No known suicide attempts  Risk to Self:   Risk to Others:   Prior Inpatient Therapy:   Prior Outpatient Therapy:    Past Medical History:  Past Medical History:  Diagnosis Date  . Back pain   . Cancer ([place])    skin  . Depression    BAD  . Gastroparesis   . GERD (gastroesophageal reflux disease)   . Hypothyroidism   . Insomnia   . Migraines   . Parkinson disease ([place])    per [facility], dx'd 2019  . Recurrent cold sores   . Shoulder bursitis   . Urge incontinence     Past Surgical History:  Procedure Laterality Date  . ABDOMINAL HYSTERECTOMY  1999   total  . APPENDECTOMY    . ESOPHAGOGASTRODUODENOSCOPY  01/2006   negative except small hiatal hernia  . ESOPHAGOGASTRODUODENOSCOPY  02/24/2015   See report  . LAPAROSCOPY     for endometriosis  . OVARIAN CYST REMOVAL  1990   unilateral  . TONSILLECTOMY AND ADENOIDECTOMY     Family History:  Family History  Problem Relation Age of Onset  . Hypertension Mother   . Arthritis Mother   . Diabetes Mother   . Stroke Mother   . Cancer Father        lung  . Colon cancer Neg Hx   . Breast cancer Neg Hx    Family Psychiatric  History: Nothing known Social History:  Social History   Substance and Sexual Activity  Alcohol Use No  . Alcohol/week: 0.0 standard drinks     Social History   Substance and Sexual Activity  Drug Use No    Social History   Socioeconomic History   . Marital status: Divorced    Spouse name: Not on file  . Number of children: Not on file  . Years of education: Not on file  . Highest education level: Not on file  Occupational History  . Not on file  Social Needs  . Financial resource strain: Not on file  . Food insecurity:    Worry: Not on file    Inability: Not on file  . Transportation needs:    Medical: Not on file    Non-medical: Not on file  Tobacco Use  . Smoking status: Former Smoker    Packs/day: 1.00    Years: 42.00    Pack years: 42.00    Types: Cigarettes  . Smokeless tobacco: Former Network engineer and Sexual Activity  . Alcohol use: No    Alcohol/week: 0.0 standard drinks  . Drug use: No  . Sexual activity: Never  Lifestyle  . Physical activity:    Days per week: Not on file    Minutes per  session: Not on file  . Stress: Not on file  Relationships  . Social connections:    Talks on phone: Not on file    Gets together: Not on file    Attends religious service: Not on file    Active member of club or organization: Not on file    Attends meetings of clubs or organizations: Not on file    Relationship status: Not on file  Other Topics Concern  . Not on file  Social History Narrative   Lives alone.     Has a Counsellor   Additional Social History:    Allergies:   Allergies  Allergen Reactions  . Cymbalta [Duloxetine Hcl]   . Valproic Acid Shortness Of Breath  . Aspirin     REACTION: UNSPECIFIED  . Baclofen     REACTION: Throat swells up  . Codeine     REACTION: UNSPECIFIED  . Erythromycin     REACTION: UNSPECIFIED  . Gabapentin     REACTION: throat swells up  . Metoclopramide Hcl     REACTION: states "messed me up"  . Nsaids Other (See Comments)    Stomach problems  . Nucynta Er [Tapentadol Hcl Er]     Intolerant, see note from 07/24/12.    Gean Birchwood [Tapentadol]     AMS  . Prednisone     GI intolance  . Pregabalin   . Seroquel [Quetiapine Fumerate] Other (See Comments)     Loss control, felt like on another planet  . Sulfa Antibiotics Nausea And Vomiting  . Topamax Other (See Comments)    Blisters in mouth and tongue  . Vicodin [Hydrocodone-Acetaminophen]     Nausea, thrush and constipation    Labs: No results found for this or any previous visit (from the past 42 hour(s)).  Current Facility-Administered Medications  Medication Dose Route Frequency Provider Last Rate Last Dose  . albuterol (PROVENTIL HFA;VENTOLIN HFA) 108 (90 Base) MCG/ACT inhaler 2 puff  2 puff Inhalation Q6H PRN Orvell Careaga T, MD      . cholecalciferol (VITAMIN D) tablet 1,000 Units  1,000 Units Oral Daily Yaire Kreher, [name], MD      . Derrill Memo ON 08/29/2018] levothyroxine (SYNTHROID, LEVOTHROID) tablet 88 mcg  88 mcg Oral QAC breakfast Breon Rehm T, MD      . LORazepam (ATIVAN) tablet 1 mg  1 mg Oral Q4H PRN Jasline Buskirk T, MD      . Derrill Memo ON 08/29/2018] lurasidone (LATUDA) tablet 40 mg  40 mg Oral Q breakfast Rashae Rother, [name], MD       Current Outpatient Medications  Medication Sig Dispense Refill  . albuterol (PROVENTIL HFA;VENTOLIN HFA) 108 (90 Base) MCG/ACT inhaler Inhale 2 puffs into the lungs every 6 (six) hours as needed for wheezing or shortness of breath. 1 Inhaler 0  . ascorbic acid (VITAMIN C) 500 MG tablet Take 500 mg by mouth daily.    . Biotin w/ Vitamins C & E (HAIR SKIN & NAILS GUMMIES) 1250-7.5-7.5 MCG-MG-UNT CHEW Chew by mouth daily.    . CHANTIX 1 MG tablet TAKE 1 TABLET BY MOUTH TWICE DAILY 60 tablet 0  . cholecalciferol (VITAMIN D) 1000 units tablet Take 1 tablet (1,000 Units total) by mouth daily.    Marland Kitchen levothyroxine (SYNTHROID, LEVOTHROID) 88 MCG tablet TAKE 1 TABLET(88 MCG) BY MOUTH DAILY 90 tablet 3  . magic mouthwash SOLN Take 5 mLs by mouth 3 (three) times daily as needed for mouth pain. 200 mL 0  .  Multiple Vitamin (MULTIVITAMIN) tablet Take 1 tablet by mouth daily.      . Omega-3 Fatty Acids (FISH OIL) 1000 MG CAPS Take 1 capsule (1,000 mg total) by mouth daily.     Marland Kitchen omeprazole (PRILOSEC) 20 MG capsule TAKE 1 CAPSULE BY MOUTH EVERY MORNING 45 MINUTES BEFORE BREAKFAST 90 capsule 0  . ondansetron (ZOFRAN-ODT) 4 MG disintegrating tablet DISSOLVE 1 TABLET BY MOUTH AS NEEDED 20 tablet 1  . OVER THE COUNTER MEDICATION Black Kohash daily for menopausal symptoms.    . Oxybutynin Chloride (GELNIQUE) 10 % GEL Place onto the skin daily.      Marland Kitchen oxyCODONE (OXY IR/ROXICODONE) 5 MG immediate release tablet Take 5 mg by mouth 4 (four) times daily.     . valACYclovir (VALTREX) 500 MG tablet TAKE 1 TABLET(500 MG) BY MOUTH TWICE DAILY 60 tablet 2  . vitamin A (VITAMIN A) 10000 UNIT capsule Take 10,000 Units by mouth daily.    . vitamin B-12 (CYANOCOBALAMIN) 1000 MCG tablet Take 1,000 mcg by mouth daily.    . vitamin E 200 UNIT capsule Take 200 Units by mouth daily.      Marland Kitchen triamcinolone cream (KENALOG) 0.1 % Apply 1 application topically 2 (two) times daily. (Patient not taking: Reported on 08/28/2018) 30 g 0    Musculoskeletal: Strength & Muscle Tone: within normal limits Gait & Station: normal Patient leans: N/A  Psychiatric Specialty Exam: Physical Exam  Nursing note and vitals reviewed. Constitutional: She appears well-developed and well-nourished.  HENT:  Head: Normocephalic and atraumatic.  Eyes: Pupils are equal, round, and reactive to light. Conjunctivae are normal.  Neck: Normal range of motion.  Cardiovascular: Regular rhythm and normal heart sounds.  Respiratory: Effort normal. No respiratory distress.  GI: Soft.  Musculoskeletal: Normal range of motion.  Neurological: She is alert.  Skin: Skin is warm and dry.  Psychiatric: Her affect is labile and inappropriate. Her affect is not blunt. Her speech is rapid and/or pressured and tangential. She is agitated. She is not aggressive. Cognition and memory are impaired. She expresses impulsivity.    Review of Systems  Constitutional: Negative.   HENT: Negative.   Eyes: Negative.   Respiratory: Negative.    Cardiovascular: Negative.   Gastrointestinal: Negative.   Musculoskeletal: Negative.   Skin: Negative.   Neurological: Negative.   Psychiatric/Behavioral: The patient is nervous/anxious and has insomnia.     Blood pressure 109/89, pulse (!) 107, temperature 98.8 F (37.1 C), resp. rate 18, height 5\' 5"  (1.651 m), weight 52.2 kg, SpO2 97 %.Body mass index is 19.14 kg/m.  General Appearance: Disheveled  Eye Contact:  Minimal  Speech:  Garbled and Pressured  Volume:  Increased  Mood:  Anxious and Euphoric  Affect:  Inappropriate and Labile  Thought Process:  Disorganized  Orientation:  Full (Time, Place, and Person)  Thought Content:  Illogical, Tangential and Flight of ideas and self described racing thoughts  Suicidal Thoughts:  No  Homicidal Thoughts:  No  Memory:  Immediate;   Fair Recent;   Poor Remote;   Fair  Judgement:  Fair  Insight:  Fair  Psychomotor Activity:  Restlessness  Concentration:  Concentration: Poor  Recall:  [employer] of Knowledge:  Fair  Language:  Fair  Akathisia:  No  Handed:  Right  AIMS (if indicated):     Assets:  Desire for Improvement Housing Physical Health Resilience Social Support  ADL's:  Impaired  Cognition:  Impaired,  Mild  Sleep:  Treatment Plan Summary: Daily contact with patient to assess and evaluate symptoms and progress in treatment, Medication management and Plan Patient with a past history of bipolar disorder who apparently last had a severe or psychotic episode 20 years ago is presenting to the hospital with psychotic thinking.  Patient is extremely agitated although not aggressive or violent.  She shows flight of ideas labile mood and appropriate euphoria.  No clear stress no evident substance abuse.  Labs largely unremarkable.  I am going to restart the most basic set of medicines that I can identify that she has been taking and also start her on Latuda as that is one medicine that was not listed in her old chart as  being difficult to tolerate.  15-minute checks in place.  PRN medicine in place.  Disposition: Recommend psychiatric Inpatient admission when medically cleared. Supportive therapy provided about ongoing stressors.  Alethia Berthold, MD 08/28/2018 3:45 PM

## 2018-08-28 NOTE — Telephone Encounter (Signed)
I spoke with [place] pts daughter(DPR signed), for one week pt has not been herself; but worsening; [name] said pt is not in her right frame of mind; Concerned for her safety but is not SI/HI. [name] said pt needs commitment for psychosis and possible schizophrenia. Advised need to take pt to ED now. [name] is not sure if pt will go and if not [place] said she would get police involved to get her to ED. FYI to Dr Damita Dunnings.

## 2018-08-28 NOTE — ED Notes (Signed)
Preparing patient for transfer to [facility].

## 2018-08-29 ENCOUNTER — Other Ambulatory Visit: Payer: Self-pay

## 2018-08-29 ENCOUNTER — Encounter: Payer: Self-pay | Admitting: Psychiatry

## 2018-08-29 DIAGNOSIS — F312 Bipolar disorder, current episode manic severe with psychotic features: Principal | ICD-10-CM

## 2018-08-29 LAB — BASIC METABOLIC PANEL
Anion gap: 9 (ref 5–15)
BUN: 13 mg/dL (ref 6–20)
CALCIUM: 10.1 mg/dL (ref 8.9–10.3)
CO2: 29 mmol/L (ref 22–32)
CREATININE: 0.75 mg/dL (ref 0.44–1.00)
Chloride: 107 mmol/L (ref 98–111)
GFR calc Af Amer: >60 mL/min (ref >60)
Glucose, Bld: 88 mg/dL (ref 70–99)
Potassium: 3.8 mmol/L (ref 3.5–5.1)
Sodium: 145 mmol/L (ref 135–145)

## 2018-08-29 LAB — LIPID PANEL
Cholesterol: 170 mg/dL (ref 0–200)
HDL: 46 mg/dL (ref >40)
LDL CALC: 107 mg/dL — AB (ref 0–99)
Total CHOL/HDL Ratio: 3.7 RATIO
Triglycerides: 83 mg/dL (ref <150)
VLDL: 17 mg/dL (ref 0–40)

## 2018-08-29 LAB — HEMOGLOBIN A1C
Hgb A1c MFr Bld: 5.5 % (ref 4.8–5.6)
Mean Plasma Glucose: 111.15 mg/dL

## 2018-08-29 LAB — TSH: TSH: 0.333 u[IU]/mL — ABNORMAL LOW (ref 0.350–4.500)

## 2018-08-29 MED ORDER — FAMOTIDINE 20 MG PO TABS
20.0000 mg | ORAL_TABLET | Freq: Every day | ORAL | Status: DC
  Administered 2018-08-29 – 2018-09-04 (×7): 20 mg via ORAL
  Filled 2018-08-29 (×7): qty 1

## 2018-08-29 MED ORDER — TEMAZEPAM 15 MG PO CAPS: 15.0000 mg | ORAL_CAPSULE | Freq: Every evening | ORAL | Status: DC | PRN

## 2018-08-29 MED ORDER — ENSURE ENLIVE PO LIQD
237.0000 mL | Freq: Three times a day (TID) | ORAL | Status: DC
  Administered 2018-08-29 – 2018-09-04 (×16): 237 mL via ORAL

## 2018-08-29 MED ORDER — LURASIDONE HCL 80 MG PO TABS
80.0000 mg | ORAL_TABLET | Freq: Every day | ORAL | Status: DC
  Filled 2018-08-29: qty 1

## 2018-08-29 MED ORDER — TEMAZEPAM 15 MG PO CAPS
15.0000 mg | ORAL_CAPSULE | Freq: Every day | ORAL | Status: DC
  Administered 2018-08-29 – 2018-09-01 (×4): 15 mg via ORAL
  Filled 2018-08-29 (×4): qty 1

## 2018-08-29 NOTE — Progress Notes (Signed)
   08/29/18 1050  Clinical Encounter Type  Visited With Patient  Visit Type Initial;Spiritual support;Behavioral Health  Referral From Social work  Consult/Referral To Chaplain  Spiritual Encounters  Spiritual Needs Emotional;Other (Comment)   [place] attended the patient's treatment team meeting. Leslie Wong had a difficult time sitting down stating it was because of all the pain she is in. When the Dr. Inquired about her Bi-polar diagnosis the patient became agitated and angry. She refuses the use of any mental health medication. Patient also stated that she is in pain and needs more pain medication. [name] refused to sign her treatment team attendance form.

## 2018-08-29 NOTE — Progress Notes (Signed)
[facility] MD Progress Note  08/30/2018 1:20 PM Leslie Wong  MRN:  [id number]  Subjective:   Leslie Wong is slightly less irritable and explosive today. She is however disorganized in her thinking, very repetitive, obsessive. She has to start every ansver going back months or years. Impossible to redirect. Denies any problems. Does not remember circumstances of her admission. Rejects diagnosis of mental illness. Refuses medications. Slept 5 hours last night with restoril.  Spoke extensively with her daughter who also rejects diagnosis of mental illness and is very suspicious of medication given bad reaction to Cymbalta in the spring and history of lithium toxicity. She will come for family meeting tomorrow at 31.  Principal Problem: Bipolar I disorder, current or most recent episode manic, with psychotic features ([place]) Diagnosis:   Patient Active Problem List   Diagnosis Date Noted  . Bipolar I disorder, current or most recent episode manic, with psychotic features ([place]) [F31.2] 08/28/2018    Priority: High  . Itch of skin [L29.9] 08/15/2018  . Oral ulcer [K12.1] 08/15/2018  . Night sweats [R61] 05/13/2018  . Pupil asymmetry [H57.02] 05/13/2018  . Hx of cold sores [Z86.19] 03/14/2018  . Parkinson disease ([place]) [G20]   . Health care maintenance [Z00.00] 06/23/2017  . HLD (hyperlipidemia) [E78.5] 03/10/2016  . Advance care planning [Z71.89] 03/10/2016  . Generalized abdominal pain [R10.84] 02/18/2015  . Osteopenia [M85.80] 04/18/2014  . Medicare annual wellness visit, initial [Z00.00] 03/18/2014  . Shoulder pain [M25.519] 01/17/2013  . Chronic back pain [M54.9, G89.29] 09/14/2011  . Hypercalcemia [E83.52] 03/25/2010  . NAUSEA WITH VOMITING [R11.2] 05/12/2009  . INCONTINENCE, URGE [N39.41] 04/16/2009  . CARPAL TUNNEL SYNDROME, BILATERAL [G56.00] 09/24/2008  . Tongue irritation [K14.9] 09/24/2008  . URETHRAL STRICTURE [IMO0002] 06/13/2007  . Hypothyroidism [E03.9] 06/05/2007  .  DISORDER, BIPOLAR NOS [F31.9] 06/05/2007  . MIGRAINE HEADACHE [G43.909] 06/05/2007  . GASTROPARESIS [K31.84] 06/05/2007  . Gastroesophageal reflux disease with hiatal hernia [K21.9, K44.9] 06/05/2007  . IRRITABLE BOWEL SYNDROME [K58.9] 06/05/2007  . FIBROCYSTIC BREAST DISEASE [N60.19] 06/05/2007   Total Time spent with patient: 20 minutes  Past Psychiatric History: bipolar disorder  Past Medical History:  Past Medical History:  Diagnosis Date  . Back pain   . Cancer ([place])    skin  . Depression    BAD  . Gastroparesis   . GERD (gastroesophageal reflux disease)   . Hypothyroidism   . Insomnia   . Migraines   . Parkinson disease ([place])    per [place] [facility], dx'd 2019  . Recurrent cold sores   . Shoulder bursitis   . Urge incontinence     Past Surgical History:  Procedure Laterality Date  . ABDOMINAL HYSTERECTOMY  1999   total  . APPENDECTOMY    . ESOPHAGOGASTRODUODENOSCOPY  01/2006   negative except small hiatal hernia  . ESOPHAGOGASTRODUODENOSCOPY  02/24/2015   See report  . LAPAROSCOPY     for endometriosis  . OVARIAN CYST REMOVAL  1990   unilateral  . TONSILLECTOMY AND ADENOIDECTOMY     Family History:  Family History  Problem Relation Age of Onset  . Hypertension Mother   . Arthritis Mother   . Diabetes Mother   . Stroke Mother   . Cancer Father        lung  . Colon cancer Neg Hx   . Breast cancer Neg Hx    Family Psychiatric  History: unknown Social History:  Social History   Substance and Sexual Activity  Alcohol Use No  .  Alcohol/week: 0.0 standard drinks     Social History   Substance and Sexual Activity  Drug Use No    Social History   Socioeconomic History  . Marital status: Divorced    Spouse name: Not on file  . Number of children: Not on file  . Years of education: Not on file  . Highest education level: Not on file  Occupational History  . Not on file  Social Needs  . Financial resource strain: Not on file  . Food insecurity:     Worry: Not on file    Inability: Not on file  . Transportation needs:    Medical: Not on file    Non-medical: Not on file  Tobacco Use  . Smoking status: Former Smoker    Packs/day: 1.00    Years: 42.00    Pack years: 42.00    Types: Cigarettes  . Smokeless tobacco: Former Network engineer and Sexual Activity  . Alcohol use: No    Alcohol/week: 0.0 standard drinks  . Drug use: No  . Sexual activity: Never  Lifestyle  . Physical activity:    Days per week: Not on file    Minutes per session: Not on file  . Stress: Not on file  Relationships  . Social connections:    Talks on phone: Not on file    Gets together: Not on file    Attends religious service: Not on file    Active member of club or organization: Not on file    Attends meetings of clubs or organizations: Not on file    Relationship status: Not on file  Other Topics Concern  . Not on file  Social History Narrative   Lives alone.     Has a Counsellor   Additional Social History:                         Sleep: Poor  Appetite:  Poor  Current Medications: Current Facility-Administered Medications  Medication Dose Route Frequency Provider Last Rate Last Dose  . acetaminophen (TYLENOL) tablet 650 mg  650 mg Oral Q6H PRN [name], [name], MD      . alum & mag hydroxide-simeth (MAALOX/MYLANTA) 200-200-20 MG/5ML suspension 30 mL  30 mL Oral Q4H PRN [name], [name], MD      . cholecalciferol (VITAMIN D) tablet 1,000 Units  1,000 Units Oral Daily [name], [name], MD   1,000 Units at 08/30/18 0909  . famotidine (PEPCID) tablet 20 mg  20 mg Oral Daily Pucilowska, Jolanta B, MD   20 mg at 08/30/18 0909  . feeding supplement (ENSURE ENLIVE) (ENSURE ENLIVE) liquid 237 mL  237 mL Oral TID BM Pucilowska, Jolanta B, MD   237 mL at 08/30/18 1211  . hydrOXYzine (ATARAX/VISTARIL) tablet 50 mg  50 mg Oral TID PRN [name], [name], MD   50 mg at 08/30/18 0242  . levothyroxine (SYNTHROID, LEVOTHROID) tablet 88 mcg   88 mcg Oral QAC breakfast [name], [name], MD   88 mcg at 08/30/18 0909  . lurasidone (LATUDA) tablet 80 mg  80 mg Oral Q supper Pucilowska, Jolanta B, MD      . magnesium hydroxide (MILK OF MAGNESIA) suspension 30 mL  30 mL Oral Daily PRN [name], [name], MD      . ondansetron (ZOFRAN-ODT) disintegrating tablet 8 mg  8 mg Oral Q8H PRN Pucilowska, Jolanta B, MD   8 mg at 08/30/18 1209  .  oxyCODONE-acetaminophen (PERCOCET/ROXICET) 5-325 MG per tablet 1 tablet  1 tablet Oral Q6H PRN [name], [name], MD   1 tablet at 08/30/18 0915  . temazepam (RESTORIL) capsule 15 mg  15 mg Oral QHS Pucilowska, Jolanta B, MD   15 mg at 08/29/18 2109    Lab Results:  Results for orders placed or performed during the hospital encounter of 08/28/18 (from the past 48 hour(s))  Hemoglobin A1c     Status: None   Collection Time: 08/29/18  7:20 AM  Result Value Ref Range   Hgb A1c MFr Bld 5.5 4.8 - 5.6 %    Comment: (NOTE) Pre diabetes:          5.7%-6.4% Diabetes:              >6.4% Glycemic control for   <7.0% adults with diabetes    Mean Plasma Glucose 111.15 mg/dL    Comment: Performed at [facility], [place] [address], [place], [place] [address]  Lipid panel     Status: Abnormal   Collection Time: 08/29/18  7:20 AM  Result Value Ref Range   Cholesterol 170 0 - 200 mg/dL   Triglycerides 83 <150 mg/dL   HDL 46 >40 mg/dL   Total CHOL/HDL Ratio 3.7 RATIO   VLDL 17 0 - 40 mg/dL   LDL Cholesterol 107 (H) 0 - 99 mg/dL    Comment:        Total Cholesterol/HDL:CHD Risk Coronary Heart Disease Risk Table                     Men   Women  1/2 Average Risk   3.4   3.3  Average Risk       5.0   4.4  2 X Average Risk   9.6   7.1  3 X Average Risk  23.4   11.0        Use the calculated Patient Ratio above and the CHD Risk Table to determine the patient's CHD Risk.        ATP III CLASSIFICATION (LDL):  <100     mg/dL   Optimal  100-129  mg/dL   Near or Above                    Optimal  130-159  mg/dL    Borderline  160-189  mg/dL   High  >190     mg/dL   Very High Performed at [facility], [address] [place], [place] [address]   TSH     Status: Abnormal   Collection Time: 08/29/18  7:20 AM  Result Value Ref Range   TSH 0.333 (L) 0.350 - 4.500 uIU/mL    Comment: Performed by a 3rd Generation assay with a functional sensitivity of <=0.01 uIU/mL. Performed at [facility], [address] [place], [place] [address]   Basic metabolic panel     Status: None   Collection Time: 08/29/18  7:20 AM  Result Value Ref Range   Sodium 145 135 - 145 mmol/L   Potassium 3.8 3.5 - 5.1 mmol/L   Chloride 107 98 - 111 mmol/L   CO2 29 22 - 32 mmol/L   Glucose, Bld 88 70 - 99 mg/dL   BUN 13 6 - 20 mg/dL   Creatinine, Ser 0.75 0.44 - 1.00 mg/dL   Calcium 10.1 8.9 - 10.3 mg/dL   GFR calc non Af Amer >60 >60 mL/min   GFR calc  Af Amer >60 >60 mL/min    Comment: (NOTE) The eGFR has been calculated using the CKD EPI equation. This calculation has not been validated in all clinical situations. eGFR's persistently <60 mL/min signify possible Chronic Kidney Disease.    Anion gap 9 5 - 15    Comment: Performed at [facility], [address] [place], [place] [address]    Blood Alcohol level:  Lab Results  Component Value Date   [facility] <10 90/24/0973    Metabolic Disorder Labs: Lab Results  Component Value Date   HGBA1C 5.5 08/29/2018   MPG 111.15 08/29/2018   No results found for: PROLACTIN Lab Results  Component Value Date   CHOL 170 08/29/2018   TRIG 83 08/29/2018   HDL 46 08/29/2018   CHOLHDL 3.7 08/29/2018   VLDL 17 08/29/2018   LDLCALC 107 (H) 08/29/2018   LDLCALC 152 (H) 02/24/2014    Physical Findings: AIMS: Facial and Oral Movements Muscles of Facial Expression: None, normal Lips and Perioral Area: None, normal Jaw: None, normal Tongue: None, normal,Extremity Movements Upper (arms, wrists, hands, fingers): None, normal Lower (legs, knees,  ankles, toes): None, normal, Trunk Movements Neck, shoulders, hips: None, normal, Overall Severity Severity of abnormal movements (highest score from questions above): None, normal Incapacitation due to abnormal movements: None, normal Patient's awareness of abnormal movements (rate only patient's report): No Awareness, Dental Status Current problems with teeth and/or dentures?: No Does patient usually wear dentures?: No  CIWA:  CIWA-Ar Total: 2 COWS:  COWS Total Score: 3  Musculoskeletal: Strength & Muscle Tone: within normal limits Gait & Station: normal Patient leans: N/A  Psychiatric Specialty Exam: Physical Exam  Nursing note and vitals reviewed. Psychiatric: Her affect is angry, labile and inappropriate. Her speech is rapid and/or pressured. She is hyperactive and actively hallucinating. Thought content is paranoid and delusional. Cognition and memory are normal. She expresses impulsivity.    Review of Systems  Gastrointestinal: Positive for abdominal pain.  Musculoskeletal: Positive for back pain.  Neurological: Negative.   Psychiatric/Behavioral: The patient has insomnia.   All other systems reviewed and are negative.   Blood pressure 109/80, pulse 99, temperature 98.3 F (36.8 C), temperature source Oral, resp. rate 18, height '4\' 8"'  (1.422 m), weight 47.6 kg, SpO2 98 %.Body mass index is 23.54 kg/m.  General Appearance: Fairly Groomed  Eye Contact:  Good  Speech:  Pressured  Volume:  Increased  Mood:  Angry, Dysphoric and Irritable  Affect:  Congruent  Thought Process:  Disorganized, Irrelevant and Descriptions of Associations: Tangential  Orientation:  Full (Time, Place, and Person)  Thought Content:  Illogical, Delusions and Paranoid Ideation  Suicidal Thoughts:  No  Homicidal Thoughts:  No  Memory:  Immediate;   Poor Recent;   Poor Remote;   Poor  Judgement:  Poor  Insight:  Lacking  Psychomotor Activity:  Increased  Concentration:  Concentration: Poor and  Attention Span: Poor  Recall:  Poor  Fund of Knowledge:  Fair  Language:  Fair  Akathisia:  No  Handed:  Right  AIMS (if indicated):     Assets:  Communication Skills Desire for Improvement Financial Resources/Insurance Housing Resilience Social Support  ADL's:  Intact  Cognition:  WNL  Sleep:  Number of Hours: 5     Treatment Plan Summary: Daily contact with patient to assess and evaluate symptoms and progress in treatment and Medication management   Leslie Wong is a 62 year old female with a history of bipolar disorder, noncompliant with medications and  no insight,  admitted for a manic episode.  #Mood and psychosis -refuses Latuda -offer Risperdal 1 mg tonight  #INsomnia -start Restoril 15 mg nightly  #Hypothyroidism -Synthroid 88 ug daily  #GERD -Pepcid 20 mg daily  #Chronic pain -Percocet 5 mg QID PRN -has pain contract with [facility]  #Labs -lipid panel and A1C are normal, TSH low, T4 pending -EKG reviewed, sinus rhythm with QTc 417 -UA nad UDS pending  #Disposition -discharge to home -follow up with regular psychiatrist at [facility], MD 08/30/2018, 1:20 PM

## 2018-08-29 NOTE — Progress Notes (Signed)
D- Patient alert and oriented. Patient presents in a pleasant mood on assessment stating that she slept "ok" last night, but has some complaints of chronic back pain. Patient rates her pain a "9/10" stating that "I have pinched nerve". Patient denies SI, HI, AVH, at this time "I don't have schizophrenia, never have, never and will". Patient is very tangential in and preoccupied with her daddy and grandchildren. Patient has no stated goals for today.  A- Scheduled medications administered to patient, per MD orders. Support and encouragement provided.  Routine safety checks conducted every 15 minutes.  Patient informed to notify staff with problems or concerns.  R- No adverse drug reactions noted. Patient contracts for safety at this time. Patient compliant with medications and treatment plan. Patient receptive, calm, and cooperative. Patient interacts well with others on the unit.  Patient remains safe at this time.

## 2018-08-29 NOTE — Progress Notes (Signed)
Recreation Therapy Notes  Date: 08/29/2018  Time: 9:30 am   Location: Craft room   Behavioral response: N/A   Intervention Topic: Team Work  Discussion/Intervention: Patient did not attend group.   Clinical Observations/Feedback:  Patient did not attend group.   Shriya Aker LRT/CTRS        Mekai Wilkinson 08/29/2018 12:10 PM

## 2018-08-29 NOTE — BHH Suicide Risk Assessment (Signed)
[facility] Admission Suicide Risk Assessment   Nursing information obtained from:  Patient Demographic factors:  Low socioeconomic status Current Mental Status:  NA Loss Factors:  Decline in physical health Historical Factors:  Impulsivity Risk Reduction Factors:  Positive therapeutic relationship  Total Time spent with patient: 1 hour Principal Problem: Bipolar I disorder, current or most recent episode manic, with psychotic features ([place]) Diagnosis:   Patient Active Problem List   Diagnosis Date Noted  . Bipolar I disorder, current or most recent episode manic, with psychotic features ([place]) [F31.2] 08/28/2018    Priority: High  . Itch of skin [L29.9] 08/15/2018  . Oral ulcer [K12.1] 08/15/2018  . Night sweats [R61] 05/13/2018  . Pupil asymmetry [H57.02] 05/13/2018  . Hx of cold sores [Z86.19] 03/14/2018  . Parkinson disease ([place]) [G20]   . Health care maintenance [Z00.00] 06/23/2017  . HLD (hyperlipidemia) [E78.5] 03/10/2016  . Advance care planning [Z71.89] 03/10/2016  . Generalized abdominal pain [R10.84] 02/18/2015  . Osteopenia [M85.80] 04/18/2014  . Medicare annual wellness visit, initial [Z00.00] 03/18/2014  . Shoulder pain [M25.519] 01/17/2013  . Chronic back pain [M54.9, G89.29] 09/14/2011  . Hypercalcemia [E83.52] 03/25/2010  . NAUSEA WITH VOMITING [R11.2] 05/12/2009  . INCONTINENCE, URGE [N39.41] 04/16/2009  . CARPAL TUNNEL SYNDROME, BILATERAL [G56.00] 09/24/2008  . Tongue irritation [K14.9] 09/24/2008  . URETHRAL STRICTURE [IMO0002] 06/13/2007  . Hypothyroidism [E03.9] 06/05/2007  . DISORDER, BIPOLAR NOS [F31.9] 06/05/2007  . MIGRAINE HEADACHE [G43.909] 06/05/2007  . GASTROPARESIS [K31.84] 06/05/2007  . Gastroesophageal reflux disease with hiatal hernia [K21.9, K44.9] 06/05/2007  . IRRITABLE BOWEL SYNDROME [K58.9] 06/05/2007  . FIBROCYSTIC BREAST DISEASE [N60.19] 06/05/2007   Subjective Data: psychotic break  Continued Clinical Symptoms:  Alcohol Use Disorder  Identification Test Final Score (AUDIT): 6 The "Alcohol Use Disorders Identification Test", Guidelines for Use in Primary Care, Second Edition.  World Pharmacologist [facility]). Score between 0-7:  no or low risk or alcohol related problems. Score between 8-15:  moderate risk of alcohol related problems. Score between 16-19:  high risk of alcohol related problems. Score 20 or above:  warrants further diagnostic evaluation for alcohol dependence and treatment.   CLINICAL FACTORS:   Bipolar Disorder:   Mixed State Chronic Pain Currently Psychotic Previous Psychiatric Diagnoses and Treatments   Musculoskeletal: Strength & Muscle Tone: within normal limits Gait & Station: normal Patient leans: N/A  Psychiatric Specialty Exam: Physical Exam  Nursing note and vitals reviewed.   ROS  Blood pressure (!) 98/46, pulse 96, temperature (!) 97.5 F (36.4 C), temperature source Oral, resp. rate 18, height 4\' 8"  (1.422 m), weight 47.6 kg, SpO2 98 %.Body mass index is 23.54 kg/m.  General Appearance: Fairly Groomed  Eye Contact:  Good  Speech:  Pressured  Volume:  Increased  Mood:  Angry, Dysphoric and Irritable  Affect:  Congruent  Thought Process:  Disorganized, Irrelevant and Descriptions of Associations: Tangential  Orientation:  Full (Time, Place, and Person)  Thought Content:  Delusions and Paranoid Ideation  Suicidal Thoughts:  No  Homicidal Thoughts:  No  Memory:  Immediate;   Poor Recent;   Poor Remote;   Poor  Judgement:  Poor  Insight:  Lacking  Psychomotor Activity:  Increased  Concentration:  Concentration: Poor and Attention Span: Poor  Recall:  Poor  Fund of Knowledge:  Poor  Language:  Fair  Akathisia:  No  Handed:  Right  AIMS (if indicated):     Assets:  Communication Skills Desire for Improvement Financial Resources/Insurance [place]  Social Support  ADL's:  Intact  Cognition:  WNL  Sleep:  Number of Hours: 4.15       COGNITIVE FEATURES THAT CONTRIBUTE TO RISK:  None    SUICIDE RISK:   Mild:  Suicidal ideation of limited frequency, intensity, duration, and specificity.  There are no identifiable plans, no associated intent, mild dysphoria and related symptoms, good self-control (both objective and subjective assessment), few other risk factors, and identifiable protective factors, including available and accessible social support.  PLAN OF CARE:   Ms. Scherger is a 62 year old female with a history of bipolar disorder, noncompliant with medications and no insight,  admitted for a manic episode.  #Mood and psychosis -increase latuda to 80 mg daily with supper -discontinue Ativan 1 mg PRN  #INsomnia -start Restoril 15 mg nightly  #Hypothyroidism -Synthroid 88 ug daily  #COPD -continue inhalers  #GERD -Pepcid 20 mg daily  #Chronic pain -Percocet 5 mg QID PRN -has pain contract with [facility]  #Labs -lipid panel, TSH, A1C -EKG -UA nad UDS pending  #Disposition -discharge to home -follow up with regular psychiatrist at [facility]     I certify that inpatient services furnished can reasonably be expected to improve the patient's condition.   Orson Slick, MD 08/29/2018, 12:34 PM

## 2018-08-29 NOTE — BHH Group Notes (Signed)
LCSW Group Therapy Note  08/29/2018 1:00 pm  Type of Therapy/Topic:  Group Therapy:  Emotion Regulation  Participation Level:  Did Not Attend   Description of Group:    The purpose of this group is to assist patients in learning to regulate negative emotions and experience positive emotions. Patients will be guided to discuss ways in which they have been vulnerable to their negative emotions. These vulnerabilities will be juxtaposed with experiences of positive emotions or situations, and patients will be challenged to use positive emotions to combat negative ones. Special emphasis will be placed on coping with negative emotions in conflict situations, and patients will process healthy conflict resolution skills.  Therapeutic Goals: 1. Patient will identify two positive emotions or experiences to reflect on in order to balance out negative emotions 2. Patient will label two or more emotions that they find the most difficult to experience 3. Patient will demonstrate positive conflict resolution skills through discussion and/or role plays  Summary of Patient Progress:  Leslie Wong was invited to today's group, but chose not to attend.     Therapeutic Modalities:   Cognitive Behavioral Therapy Feelings Identification Dialectical Behavioral Therapy

## 2018-08-29 NOTE — Tx Team (Signed)
Initial Treatment Plan 08/29/2018 1:40 AM Leslie Wong XMD:[id number]    PATIENT STRESSORS: Financial difficulties Medication change or noncompliance Occupational concerns   PATIENT STRENGTHS: Active sense of humor Average or above average intelligence Capable of independent living Motivation for treatment/growth Supportive family/friends   PATIENT IDENTIFIED PROBLEMS: Chronic back pain    Depression/Anxiety    AMS    None compliant medication          DISCHARGE CRITERIA:  Adequate post-discharge living arrangements Medical problems require only outpatient monitoring Need for constant or close observation no longer present Safe-care adequate arrangements made  PRELIMINARY DISCHARGE PLAN: Outpatient therapy Participate in family therapy Return to previous living arrangement  PATIENT/FAMILY INVOLVEMENT: This treatment plan has been presented to and reviewed with the patient, Leslie Wong,  The patient  have been given the opportunity to ask questions and make suggestions.  Clemens Catholic, RN 08/29/2018, 1:40 AM

## 2018-08-29 NOTE — H&P (Signed)
Psychiatric Admission Assessment Adult  Patient Identification: Leslie Wong MRN:  [id number] Date of Evaluation:  08/29/2018 Chief Complaint:  Bipolar Principal Diagnosis: Bipolar I disorder, current or most recent episode manic, with psychotic features [facility]) Diagnosis:   Patient Active Problem List   Diagnosis Date Noted  . Bipolar I disorder, current or most recent episode manic, with psychotic features ([place]) [F31.2] 08/28/2018    Priority: High  . Itch of skin [L29.9] 08/15/2018  . Oral ulcer [K12.1] 08/15/2018  . Night sweats [R61] 05/13/2018  . Pupil asymmetry [H57.02] 05/13/2018  . Hx of cold sores [Z86.19] 03/14/2018  . Parkinson disease ([place]) [G20]   . Health care maintenance [Z00.00] 06/23/2017  . HLD (hyperlipidemia) [E78.5] 03/10/2016  . Advance care planning [Z71.89] 03/10/2016  . Generalized abdominal pain [R10.84] 02/18/2015  . Osteopenia [M85.80] 04/18/2014  . Medicare annual wellness visit, initial [Z00.00] 03/18/2014  . Shoulder pain [M25.519] 01/17/2013  . Chronic back pain [M54.9, G89.29] 09/14/2011  . Hypercalcemia [E83.52] 03/25/2010  . NAUSEA WITH VOMITING [R11.2] 05/12/2009  . INCONTINENCE, URGE [N39.41] 04/16/2009  . CARPAL TUNNEL SYNDROME, BILATERAL [G56.00] 09/24/2008  . Tongue irritation [K14.9] 09/24/2008  . URETHRAL STRICTURE [IMO0002] 06/13/2007  . Hypothyroidism [E03.9] 06/05/2007  . DISORDER, BIPOLAR NOS [F31.9] 06/05/2007  . MIGRAINE HEADACHE [G43.909] 06/05/2007  . GASTROPARESIS [K31.84] 06/05/2007  . Gastroesophageal reflux disease with hiatal hernia [K21.9, K44.9] 06/05/2007  . IRRITABLE BOWEL SYNDROME [K58.9] 06/05/2007  . FIBROCYSTIC BREAST DISEASE [N60.19] 06/05/2007   History of Present Illness:   Identifying data. Leslie Wong is a 62 year old female with a history of bipolar disorder.   Chief complaint. "Give me a little time."  History of present illness. Information was obtained from the patient and the chart. The  patient was brought to the ER at the urging of her PCP and neighbors who noticed change in her behavior for the past week or so. She was not sleepin, talking "out of her head" and agitated. She got very upset at the police and threw her dentures in the house and nor is unable tro tolerate normal diet.   The patient herself denies any problems, does not know how she ended up in the hospital and is very peasant to discuss all her physical problems. She get furious if the subject of mental illness comes up. "It was proven by psychiatrist at [facility] that I do not have bipolr". She denies any symptoms of depression, anxiety, psychosis or substance use. Refuses to take "bipolar medications".  Past psychiatric history. Long history of bipolar. Apparently unable to tolerate many medications including Seroquel. She is very adamant that Cymbalta prescribed in February 2019 made her very sick and she is still recovering from it. Denies suicide attempts. According to the chart, she was hospitalized before.  Family psychiatric history. Unknown.  Social history. Lives independently with a dog. Her daughter has been involved in her care. She is a patient at [facility] where she is prescribed Percocet 5 mg QID with no relief. Sge has appointment in the fall when she wants to ask to increase the dose.  Total Time spent with patient: 1 hour  Is the patient at risk to self? No.  Has the patient been a risk to self in the past 6 months? No.  Has the patient been a risk to self within the distant past? No.  Is the patient a risk to others? No.  Has the patient been a risk to others in the past 6 months? No.  Has the patient been a risk to others within the distant past? No.   Prior Inpatient Therapy:   Prior Outpatient Therapy:    Alcohol Screening: 1. How often do you have a drink containing alcohol?: Monthly or less 2. How many drinks containing alcohol do you have on a typical day when you are drinking?: 1  or 2 3. How often do you have six or more drinks on one occasion?: Less than monthly AUDIT-C Score: 2 4. How often during the last year have you found that you were not able to stop drinking once you had started?: Less than monthly 5. How often during the last year have you failed to do what was normally expected from you becasue of drinking?: Less than monthly 6. How often during the last year have you needed a first drink in the morning to get yourself going after a heavy drinking session?: Never 7. How often during the last year have you had a feeling of guilt of remorse after drinking?: Less than monthly 8. How often during the last year have you been unable to remember what happened the night before because you had been drinking?: Less than monthly 9. Have you or someone else been injured as a result of your drinking?: No 10. Has a relative or friend or a doctor or another health worker been concerned about your drinking or suggested you cut down?: No Alcohol Use Disorder Identification Test Final Score (AUDIT): 6 Intervention/Follow-up: Alcohol Education Substance Abuse History in the last 12 months:  No. Consequences of Substance Abuse: NA Previous Psychotropic Medications: Yes  Psychological Evaluations: No  Past Medical History:  Past Medical History:  Diagnosis Date  . Back pain   . Cancer ([place])    skin  . Depression    BAD  . Gastroparesis   . GERD (gastroesophageal reflux disease)   . Hypothyroidism   . Insomnia   . Migraines   . Parkinson disease ([place])    per [facility], dx'd 2019  . Recurrent cold sores   . Shoulder bursitis   . Urge incontinence     Past Surgical History:  Procedure Laterality Date  . ABDOMINAL HYSTERECTOMY  1999   total  . APPENDECTOMY    . ESOPHAGOGASTRODUODENOSCOPY  01/2006   negative except small hiatal hernia  . ESOPHAGOGASTRODUODENOSCOPY  02/24/2015   See report  . LAPAROSCOPY     for endometriosis  . OVARIAN CYST REMOVAL  1990    unilateral  . TONSILLECTOMY AND ADENOIDECTOMY     Family History:  Family History  Problem Relation Age of Onset  . Hypertension Mother   . Arthritis Mother   . Diabetes Mother   . Stroke Mother   . Cancer Father        lung  . Colon cancer Neg Hx   . Breast cancer Neg Hx     Tobacco Screening: Have you used any form of tobacco in the last 30 days? (Cigarettes, Smokeless Tobacco, Cigars, and/or Pipes): No Social History:  Social History   Substance and Sexual Activity  Alcohol Use No  . Alcohol/week: 0.0 standard drinks     Social History   Substance and Sexual Activity  Drug Use No    Additional Social History:                           Allergies:   Allergies  Allergen Reactions  . Cymbalta [Duloxetine Hcl]   . Valproic  Acid Shortness Of Breath  . Aspirin     REACTION: UNSPECIFIED  . Baclofen     REACTION: Throat swells up  . Codeine     REACTION: UNSPECIFIED  . Erythromycin     REACTION: UNSPECIFIED  . Gabapentin     REACTION: throat swells up  . Metoclopramide Hcl     REACTION: states "messed me up"  . Nsaids Other (See Comments)    Stomach problems  . Nucynta Er [Tapentadol Hcl Er]     Intolerant, see note from 07/24/12.    Gean Birchwood [Tapentadol]     AMS  . Prednisone     GI intolance  . Pregabalin   . Seroquel [Quetiapine Fumerate] Other (See Comments)    Loss control, felt like on another planet  . Sulfa Antibiotics Nausea And Vomiting  . Topamax Other (See Comments)    Blisters in mouth and tongue  . Vicodin [Hydrocodone-Acetaminophen]     Nausea, thrush and constipation   Lab Results:  Results for orders placed or performed during the hospital encounter of 08/28/18 (from the past 48 hour(s))  Hemoglobin A1c     Status: None   Collection Time: 08/29/18  7:20 AM  Result Value Ref Range   Hgb A1c MFr Bld 5.5 4.8 - 5.6 %    Comment: (NOTE) Pre diabetes:          5.7%-6.4% Diabetes:              >6.4% Glycemic control for    <7.0% adults with diabetes    Mean Plasma Glucose 111.15 mg/dL    Comment: Performed at [facility], [place] [address] [place], [place] [address]  Lipid panel     Status: Abnormal   Collection Time: 08/29/18  7:20 AM  Result Value Ref Range   Cholesterol 170 0 - 200 mg/dL   Triglycerides 83 <150 mg/dL   HDL 46 >40 mg/dL   Total CHOL/HDL Ratio 3.7 RATIO   VLDL 17 0 - 40 mg/dL   LDL Cholesterol 107 (H) 0 - 99 mg/dL    Comment:        Total Cholesterol/HDL:CHD Risk Coronary Heart Disease Risk Table                     Men   Women  1/2 Average Risk   3.4   3.3  Average Risk       5.0   4.4  2 X Average Risk   9.6   7.1  3 X Average Risk  23.4   11.0        Use the calculated Patient Ratio above and the CHD Risk Table to determine the patient's CHD Risk.        ATP III CLASSIFICATION (LDL):  <100     mg/dL   Optimal  100-129  mg/dL   Near or Above                    Optimal  130-159  mg/dL   Borderline  160-189  mg/dL   High  >190     mg/dL   Very High Performed at [facility], [address] [name], [place] [address]   TSH     Status: Abnormal   Collection Time: 08/29/18  7:20 AM  Result Value Ref Range   TSH 0.333 (L) 0.350 - 4.500 uIU/mL    Comment: Performed by a 3rd Generation assay with a functional sensitivity  of <=0.01 uIU/mL. Performed at [facility], [address] [place], [place] [address]   Basic metabolic panel     Status: None   Collection Time: 08/29/18  7:20 AM  Result Value Ref Range   Sodium 145 135 - 145 mmol/L   Potassium 3.8 3.5 - 5.1 mmol/L   Chloride 107 98 - 111 mmol/L   CO2 29 22 - 32 mmol/L   Glucose, Bld 88 70 - 99 mg/dL   BUN 13 6 - 20 mg/dL   Creatinine, Ser 0.75 0.44 - 1.00 mg/dL   Calcium 10.1 8.9 - 10.3 mg/dL   GFR calc non Af Amer >60 >60 mL/min   GFR calc Af Amer >60 >60 mL/min    Comment: (NOTE) The eGFR has been calculated using the CKD EPI equation. This calculation has not been validated in all  clinical situations. eGFR's persistently <60 mL/min signify possible Chronic Kidney Disease.    Anion gap 9 5 - 15    Comment: Performed at [facility] [address] [place], [place] [address]    Blood Alcohol level:  Lab Results  Component Value Date   [facility] <10 93/73/4287    Metabolic Disorder Labs:  Lab Results  Component Value Date   HGBA1C 5.5 08/29/2018   MPG 111.15 08/29/2018   No results found for: PROLACTIN Lab Results  Component Value Date   CHOL 170 08/29/2018   TRIG 83 08/29/2018   HDL 46 08/29/2018   CHOLHDL 3.7 08/29/2018   VLDL 17 08/29/2018   LDLCALC 107 (H) 08/29/2018   LDLCALC 152 (H) 02/24/2014    Current Medications: Current Facility-Administered Medications  Medication Dose Route Frequency Provider Last Rate Last Dose  . acetaminophen (TYLENOL) tablet 650 mg  650 mg Oral Q6H PRN [name], [name], MD      . albuterol (PROVENTIL) (2.5 MG/3ML) 0.083% nebulizer solution 3 mL  3 mL Inhalation Q6H PRN [name], [name], MD      . alum & mag hydroxide-simeth (MAALOX/MYLANTA) 200-200-20 MG/5ML suspension 30 mL  30 mL Oral Q4H PRN [name], [name], MD      . cholecalciferol (VITAMIN D) tablet 1,000 Units  1,000 Units Oral Daily [name], [name], MD   1,000 Units at 08/29/18 [phone]  . famotidine (PEPCID) tablet 20 mg  20 mg Oral Daily Ciena Sampley B, MD      . feeding supplement (ENSURE ENLIVE) (ENSURE ENLIVE) liquid 237 mL  237 mL Oral TID BM Georgette Helmer B, MD      . hydrOXYzine (ATARAX/VISTARIL) tablet 50 mg  50 mg Oral TID PRN [name], [name], MD   50 mg at 08/28/18 2350  . levothyroxine (SYNTHROID, LEVOTHROID) tablet 88 mcg  88 mcg Oral QAC breakfast [name], [name], MD   88 mcg at 08/29/18 0950  . [START ON 08/30/2018] lurasidone (LATUDA) tablet 80 mg  80 mg Oral Q supper Soul Hackman B, MD      . magnesium hydroxide (MILK OF MAGNESIA) suspension 30 mL  30 mL Oral Daily PRN [name], [name], MD      . oxyCODONE-acetaminophen  (PERCOCET/ROXICET) 5-325 MG per tablet 1 tablet  1 tablet Oral Q6H PRN [name], [name], MD   1 tablet at 08/29/18 0959  . temazepam (RESTORIL) capsule 15 mg  15 mg Oral QHS Nandini Bogdanski B, MD       PTA Medications: Medications Prior to Admission  Medication Sig Dispense Refill Last Dose  . albuterol (PROVENTIL HFA;VENTOLIN HFA)  108 (90 Base) MCG/ACT inhaler Inhale 2 puffs into the lungs every 6 (six) hours as needed for wheezing or shortness of breath. 1 Inhaler 0 PRN at PRN  . ascorbic acid (VITAMIN C) 500 MG tablet Take 500 mg by mouth daily.   Taking  . Biotin w/ Vitamins C & E (HAIR SKIN & NAILS GUMMIES) 1250-7.5-7.5 MCG-MG-UNT CHEW Chew by mouth daily.   Taking  . CHANTIX 1 MG tablet TAKE 1 TABLET BY MOUTH TWICE DAILY 60 tablet 0   . cholecalciferol (VITAMIN D) 1000 units tablet Take 1 tablet (1,000 Units total) by mouth daily.   Taking  . levothyroxine (SYNTHROID, LEVOTHROID) 88 MCG tablet TAKE 1 TABLET(88 MCG) BY MOUTH DAILY 90 tablet 3 Taking  . magic mouthwash SOLN Take 5 mLs by mouth 3 (three) times daily as needed for mouth pain. 200 mL 0 PRN at PRN  . Multiple Vitamin (MULTIVITAMIN) tablet Take 1 tablet by mouth daily.     Taking  . Omega-3 Fatty Acids (FISH OIL) 1000 MG CAPS Take 1 capsule (1,000 mg total) by mouth daily.   Taking  . omeprazole (PRILOSEC) 20 MG capsule TAKE 1 CAPSULE BY MOUTH EVERY MORNING 45 MINUTES BEFORE BREAKFAST 90 capsule 0 Taking  . ondansetron (ZOFRAN-ODT) 4 MG disintegrating tablet DISSOLVE 1 TABLET BY MOUTH AS NEEDED 20 tablet 1 PRN at PRN  . OVER THE COUNTER MEDICATION [place] daily for menopausal symptoms.   Taking  . Oxybutynin Chloride (GELNIQUE) 10 % GEL Place onto the skin daily.     Taking  . oxyCODONE (OXY IR/ROXICODONE) 5 MG immediate release tablet Take 5 mg by mouth 4 (four) times daily.    Taking  . triamcinolone cream (KENALOG) 0.1 % Apply 1 application topically 2 (two) times daily. (Patient not taking: Reported on 08/28/2018) 30 g 0  Not Taking at Unknown time  . valACYclovir (VALTREX) 500 MG tablet TAKE 1 TABLET(500 MG) BY MOUTH TWICE DAILY 60 tablet 2   . vitamin A (VITAMIN A) 10000 UNIT capsule Take 10,000 Units by mouth daily.   Taking  . vitamin B-12 (CYANOCOBALAMIN) 1000 MCG tablet Take 1,000 mcg by mouth daily.   Taking  . vitamin E 200 UNIT capsule Take 200 Units by mouth daily.     Taking    Musculoskeletal: Strength & Muscle Tone: within normal limits Gait & Station: normal Patient leans: N/A  Psychiatric Specialty Exam: I reviewed physiacal exam performed in the ER and agree with the findings. Physical Exam  Nursing note and vitals reviewed.   ROS  Blood pressure (!) 98/46, pulse 96, temperature (!) 97.5 F (36.4 C), temperature source Oral, resp. rate 18, height 4' 8" (1.422 m), weight 47.6 kg, SpO2 98 %.Body mass index is 23.54 kg/m.  See SRA                                                  Sleep:  Number of Hours: 4.15    Treatment Plan Summary: Daily contact with patient to assess and evaluate symptoms and progress in treatment and Medication management   Leslie Wong is a 62 year old female with a history of bipolar disorder, noncompliant with medications and no insight,  admitted for a manic episode.  #Mood and psychosis -increase latuda to 80 mg daily with supper -discontinue Ativan 1 mg PRN  #INsomnia -start Restoril 15  mg nightly  #Hypothyroidism -Synthroid 88 ug daily  #COPD -continue inhalers  #GERD -Pepcid 20 mg daily  #Chronic pain -Percocet 5 mg QID PRN -has pain contract with [facility]  #Labs -lipid panel, TSH, A1C -EKG -UA nad UDS pending  #Disposition -discharge to home -follow up with regular psychiatrist at [facility]   Observation Level/Precautions:  15 minute checks  Laboratory:  CBC Chemistry Profile UDS UA  Psychotherapy:    Medications:    Consultations:    Discharge Concerns:    Estimated LOS:  Other:      Physician Treatment Plan for Primary Diagnosis: Bipolar I disorder, current or most recent episode manic, with psychotic features ([place]) Long Term Goal(s): Improvement in symptoms so as ready for discharge  Short Term Goals: Ability to identify changes in lifestyle to reduce recurrence of condition will improve, Ability to verbalize feelings will improve, Ability to disclose and discuss suicidal ideas, Ability to demonstrate self-control will improve, Ability to identify and develop effective coping behaviors will improve, Ability to maintain clinical measurements within normal limits will improve, Compliance with prescribed medications will improve and Ability to identify triggers associated with substance abuse/mental health issues will improve  Physician Treatment Plan for Secondary Diagnosis: Principal Problem:   Bipolar I disorder, current or most recent episode manic, with psychotic features ([place]) Active Problems:   Hypothyroidism   Gastroesophageal reflux disease with hiatal hernia  Long Term Goal(s): Improvement in symptoms so as ready for discharge  Short Term Goals: NA  I certify that inpatient services furnished can reasonably be expected to improve the patient's condition.    Orson Slick, MD 9/4/201912:40 PM

## 2018-08-29 NOTE — Progress Notes (Signed)
Patient ID: Leslie Wong, female   DOB: 1956/07/24, 62 y.o.   MRN: [id number]  PER STATE REGULATIONS 482.30  THIS CHART WAS REVIEWED FOR MEDICAL NECESSITY WITH RESPECT TO THE PATIENT'S ADMISSION/DURATION OF STAY.  NEXT REVIEW DATE:09/01/18  Roma Schanz, RN, BSN CASE MANAGER

## 2018-08-29 NOTE — Progress Notes (Signed)
New admit in the unit, who present with altered mental status and change in behaviors, the daughter is the health care legal guardian, patient has chronic back pains, percocet administered /order for pain 8/10, low potassium level noted 3.0  Dr. Weber Cooks notified, order noted for labs in Am. Patient verbally contract for safety and unit guidelines and expected behaviors discussed, hygiene products provided, unit and room orientation complete , patient denies SI/HI and no signs of AVH, body search complete no contraband found , skin check done Silvana Newness RN .clean no open area found. 15 minute safety rounds in progress.

## 2018-08-29 NOTE — Tx Team (Addendum)
Interdisciplinary Treatment and Diagnostic Plan Update  08/29/2018 Time of Session: 10:50 AM Leslie Wong MRN: [id number]  Principal Diagnosis: Bipolar I disorder, current or most recent episode manic, with psychotic features ([place])  Secondary Diagnoses: Principal Problem:   Bipolar I disorder, current or most recent episode manic, with psychotic features ([place]) Active Problems:   Hypothyroidism   Gastroesophageal reflux disease with hiatal hernia   Current Medications:  Current Facility-Administered Medications  Medication Dose Route Frequency Provider Last Rate Last Dose  . acetaminophen (TYLENOL) tablet 650 mg  650 mg Oral Q6H PRN [name], [name], MD      . albuterol (PROVENTIL) (2.5 MG/3ML) 0.083% nebulizer solution 3 mL  3 mL Inhalation Q6H PRN [name], [name], MD      . alum & mag hydroxide-simeth (MAALOX/MYLANTA) 200-200-20 MG/5ML suspension 30 mL  30 mL Oral Q4H PRN [name], [name], MD      . cholecalciferol (VITAMIN D) tablet 1,000 Units  1,000 Units Oral Daily [name], [name], MD   1,000 Units at 08/29/18 [phone]  . famotidine (PEPCID) tablet 20 mg  20 mg Oral Daily Pucilowska, Jolanta B, MD      . feeding supplement (ENSURE ENLIVE) (ENSURE ENLIVE) liquid 237 mL  237 mL Oral TID BM Pucilowska, Jolanta B, MD      . hydrOXYzine (ATARAX/VISTARIL) tablet 50 mg  50 mg Oral TID PRN [name], [name], MD   50 mg at 08/28/18 2350  . levothyroxine (SYNTHROID, LEVOTHROID) tablet 88 mcg  88 mcg Oral QAC breakfast [name], [name], MD   88 mcg at 08/29/18 0950  . [START ON 08/30/2018] lurasidone (LATUDA) tablet 80 mg  80 mg Oral Q supper Pucilowska, Jolanta B, MD      . magnesium hydroxide (MILK OF MAGNESIA) suspension 30 mL  30 mL Oral Daily PRN [name], [name], MD      . oxyCODONE-acetaminophen (PERCOCET/ROXICET) 5-325 MG per tablet 1 tablet  1 tablet Oral Q6H PRN [name], [name], MD   1 tablet at 08/29/18 0959  . temazepam (RESTORIL) capsule 15 mg  15 mg Oral QHS Pucilowska, Jolanta B,  MD       PTA Medications: Medications Prior to Admission  Medication Sig Dispense Refill Last Dose  . albuterol (PROVENTIL HFA;VENTOLIN HFA) 108 (90 Base) MCG/ACT inhaler Inhale 2 puffs into the lungs every 6 (six) hours as needed for wheezing or shortness of breath. 1 Inhaler 0 PRN at PRN  . ascorbic acid (VITAMIN C) 500 MG tablet Take 500 mg by mouth daily.   Taking  . Biotin w/ Vitamins C & E (HAIR SKIN & NAILS GUMMIES) 1250-7.5-7.5 MCG-MG-UNT CHEW Chew by mouth daily.   Taking  . CHANTIX 1 MG tablet TAKE 1 TABLET BY MOUTH TWICE DAILY 60 tablet 0   . cholecalciferol (VITAMIN D) 1000 units tablet Take 1 tablet (1,000 Units total) by mouth daily.   Taking  . levothyroxine (SYNTHROID, LEVOTHROID) 88 MCG tablet TAKE 1 TABLET(88 MCG) BY MOUTH DAILY 90 tablet 3 Taking  . magic mouthwash SOLN Take 5 mLs by mouth 3 (three) times daily as needed for mouth pain. 200 mL 0 PRN at PRN  . Multiple Vitamin (MULTIVITAMIN) tablet Take 1 tablet by mouth daily.     Taking  . Omega-3 Fatty Acids (FISH OIL) 1000 MG CAPS Take 1 capsule (1,000 mg total) by mouth daily.   Taking  . omeprazole (PRILOSEC) 20 MG capsule TAKE 1 CAPSULE BY MOUTH EVERY MORNING 45 MINUTES BEFORE BREAKFAST  90 capsule 0 Taking  . ondansetron (ZOFRAN-ODT) 4 MG disintegrating tablet DISSOLVE 1 TABLET BY MOUTH AS NEEDED 20 tablet 1 PRN at PRN  . OVER THE COUNTER MEDICATION [place] daily for menopausal symptoms.   Taking  . Oxybutynin Chloride (GELNIQUE) 10 % GEL Place onto the skin daily.     Taking  . oxyCODONE (OXY IR/ROXICODONE) 5 MG immediate release tablet Take 5 mg by mouth 4 (four) times daily.    Taking  . triamcinolone cream (KENALOG) 0.1 % Apply 1 application topically 2 (two) times daily. (Patient not taking: Reported on 08/28/2018) 30 g 0 Not Taking at Unknown time  . valACYclovir (VALTREX) 500 MG tablet TAKE 1 TABLET(500 MG) BY MOUTH TWICE DAILY 60 tablet 2   . vitamin A (VITAMIN A) 10000 UNIT capsule Take 10,000 Units by mouth  daily.   Taking  . vitamin B-12 (CYANOCOBALAMIN) 1000 MCG tablet Take 1,000 mcg by mouth daily.   Taking  . vitamin E 200 UNIT capsule Take 200 Units by mouth daily.     Taking    Patient Stressors: Financial difficulties Medication change or noncompliance Occupational concerns  Patient Strengths: Active sense of humor Average or above average intelligence Capable of independent living Motivation for treatment/growth Supportive family/friends  Treatment Modalities: Medication Management, Group therapy, Case management,  1 to 1 session with clinician, Psychoeducation, Recreational therapy.   Physician Treatment Plan for Primary Diagnosis: Bipolar I disorder, current or most recent episode manic, with psychotic features ([place]) Long Term Goal(s): Improvement in symptoms so as ready for discharge Improvement in symptoms so as ready for discharge   Short Term Goals: Ability to identify changes in lifestyle to reduce recurrence of condition will improve Ability to verbalize feelings will improve Ability to disclose and discuss suicidal ideas Ability to demonstrate self-control will improve Ability to identify and develop effective coping behaviors will improve Ability to maintain clinical measurements within normal limits will improve Compliance with prescribed medications will improve Ability to identify triggers associated with substance abuse/mental health issues will improve NA  Medication Management: Evaluate patient's response, side effects, and tolerance of medication regimen.  Therapeutic Interventions: 1 to 1 sessions, Unit Group sessions and Medication administration.  Evaluation of Outcomes: Progressing  Physician Treatment Plan for Secondary Diagnosis: Principal Problem:   Bipolar I disorder, current or most recent episode manic, with psychotic features ([place]) Active Problems:   Hypothyroidism   Gastroesophageal reflux disease with hiatal hernia  Long Term Goal(s):  Improvement in symptoms so as ready for discharge Improvement in symptoms so as ready for discharge   Short Term Goals: Ability to identify changes in lifestyle to reduce recurrence of condition will improve Ability to verbalize feelings will improve Ability to disclose and discuss suicidal ideas Ability to demonstrate self-control will improve Ability to identify and develop effective coping behaviors will improve Ability to maintain clinical measurements within normal limits will improve Compliance with prescribed medications will improve Ability to identify triggers associated with substance abuse/mental health issues will improve NA     Medication Management: Evaluate patient's response, side effects, and tolerance of medication regimen.  Therapeutic Interventions: 1 to 1 sessions, Unit Group sessions and Medication administration.  Evaluation of Outcomes: Progressing   RN Treatment Plan for Primary Diagnosis: Bipolar I disorder, current or most recent episode manic, with psychotic features ([place]) Long Term Goal(s): Knowledge of disease and therapeutic regimen to maintain health will improve  Short Term Goals: Ability to remain free from injury will improve, Ability to  verbalize frustration and anger appropriately will improve, Ability to identify and develop effective coping behaviors will improve and Compliance with prescribed medications will improve  Medication Management: RN will administer medications as ordered by provider, will assess and evaluate patient's response and provide education to patient for prescribed medication. RN will report any adverse and/or side effects to prescribing provider.  Therapeutic Interventions: 1 on 1 counseling sessions, Psychoeducation, Medication administration, Evaluate responses to treatment, Monitor vital signs and CBGs as ordered, Perform/monitor CIWA, COWS, AIMS and Fall Risk screenings as ordered, Perform wound care treatments as  ordered.  Evaluation of Outcomes: Progressing   LCSW Treatment Plan for Primary Diagnosis: Bipolar I disorder, current or most recent episode manic, with psychotic features ([place]) Long Term Goal(s): Safe transition to appropriate next level of care at discharge, Engage patient in therapeutic group addressing interpersonal concerns.  Short Term Goals: Engage patient in aftercare planning with referrals and resources and Increase skills for wellness and recovery  Therapeutic Interventions: Assess for all discharge needs, 1 to 1 time with Social worker, Explore available resources and support systems, Assess for adequacy in community support network, Educate family and significant other(s) on suicide prevention, Complete Psychosocial Assessment, Interpersonal group therapy.  Evaluation of Outcomes: Progressing   Progress in Treatment: Attending groups: No. Participating in groups: No. Taking medication as prescribed: Yes. Toleration medication: Yes. Family/Significant other contact made: No, will contact:  CSW will contact identified support person Patient understands diagnosis: No. Discussing patient identified problems/goals with staff: Yes. Medical problems stabilized or resolved: Yes. Denies suicidal/homicidal ideation: Yes. Issues/concerns per patient self-inventory: No. Other: n/a  New problem(s) identified: No, Describe:  No new problems identified  New Short Term/Long Term Goal(s):  Patient Goals:  "I don't really know why I am here.  I do not have Bipolar"  Discharge Plan or Barriers: Tentative plan is for pt to return to her home and resume psychiatric outpatient services at [facility].  Reason for Continuation of Hospitalization: Mania Medication stabilization  Estimated Length of Stay: 5-7 days  Recreational Therapy: Patient Stressors: N/A Patient Goal: Patient will engage in interactions with peers and staff in pro-social manner at least 2x within 5 recreation therapy  group sessions  Attendees: Patient: Leslie Wong 08/29/2018 2:11 PM  Physician: Orson Slick, MD 08/29/2018 2:11 PM  Nursing: Canary Brim, RN 08/29/2018 2:11 PM  RN Care Manager: 08/29/2018 2:11 PM  Social Worker: Derrek Gu, LCSW 08/29/2018 2:11 PM  Recreational Therapist: Roanna Epley, LRT 08/29/2018 2:11 PM  Other: Marney Doctor, Chaplain 08/29/2018 2:11 PM  Other: Darin Engels, [place] 08/29/2018 2:11 PM  Other: 08/29/2018 2:11 PM    Scribe for Treatment Team: Devona Konig, LCSW 08/29/2018 2:11 PM

## 2018-08-29 NOTE — Progress Notes (Signed)
Recreation Therapy Notes  INPATIENT RECREATION THERAPY ASSESSMENT  Patient Details Name: Leslie Wong MRN: [id number] DOB: 12-26-1956 Today's Date: 08/29/2018       Information Obtained From: (Patient refused assessment)  Able to Participate in Assessment/Interview:    Patient Presentation:    Reason for Admission (Per Patient):    Patient Stressors:    Coping Skills:      Leisure Interests (2+):     Frequency of Recreation/Participation:    Awareness of Community Resources:     [employer]:     Current Use:    If no, Barriers?:    Expressed Interest in [place] of Residence:     Patient Main Form of Transportation:    Patient Strengths:     Patient Identified Areas of Improvement:     Patient Goal for Hospitalization:     Current SI (including self-harm):     Current HI:     Current AVH:    Staff Intervention Plan:    Consent to Intern Participation:    Leslie Wong 08/29/2018, 4:19 PM

## 2018-08-30 ENCOUNTER — Encounter: Payer: Medicare Other | Admitting: Family Medicine

## 2018-08-30 LAB — T4, FREE: FREE T4: 1.03 ng/dL (ref 0.82–1.77)

## 2018-08-30 MED ORDER — ONDANSETRON 4 MG PO TBDP
8.0000 mg | ORAL_TABLET | Freq: Three times a day (TID) | ORAL | Status: DC | PRN
  Administered 2018-08-30 – 2018-09-04 (×4): 8 mg via ORAL
  Filled 2018-08-30 (×5): qty 2

## 2018-08-30 MED ORDER — RISPERIDONE 1 MG PO TABS
2.0000 mg | ORAL_TABLET | Freq: Every day | ORAL | Status: DC
  Administered 2018-08-30 – 2018-09-01 (×3): 2 mg via ORAL
  Filled 2018-08-30 (×3): qty 2

## 2018-08-30 NOTE — [facility] Group Notes (Signed)
LCSW Group Therapy Note  08/30/2018 1:15pm  Type of Therapy/Topic:  Group Therapy:  Balance in Life  Participation Level:  Active  Description of Group:    This group will address the concept of balance and how it feels and looks when one is unbalanced. Patients will be encouraged to process areas in their lives that are out of balance and identify reasons for remaining unbalanced. Facilitators will guide patients in utilizing problem-solving interventions to address and correct the stressor making their life unbalanced. Understanding and applying boundaries will be explored and addressed for obtaining and maintaining a balanced life. Patients will be encouraged to explore ways to assertively make their unbalanced needs known to significant others in their lives, using other group members and facilitator for support and feedback.  Therapeutic Goals: 1. Patient will identify two or more emotions or situations they have that consume much of in their lives. 2. Patient will identify signs/triggers that life has become out of balance:  3. Patient will identify two ways to set boundaries in order to achieve balance in their lives:  4. Patient will demonstrate ability to communicate their needs through discussion and/or role plays  Summary of Patient Progress:  Adilyn shared that she has been consumed by grief. She described recent losses in the past year that have contributed to this. She named her children, grandchildren, and her service dog as supports in her life that keep her balanced.       Therapeutic Modalities:   Cognitive Behavioral Therapy Solution-Focused Therapy Assertiveness Training  Devona Konig,  08/30/2018 2:26 PM

## 2018-08-30 NOTE — Progress Notes (Signed)
D: Pt denies SI/HI/AVH, and verbally is able to contract for safety. Pt. Is very intrusive to staff and peers on the unit, frequently needing to be redirected away from the nursing station. Pt. Has many demands, complaints, and generalized vague symptoms she says that she is having, but upon further assessment is not able to elaborate enough and or make reasonable requests. Pt. Presents with paranoia, tangential speech, multiple complaints, and needs from staff. Pt. Does report chronic pain that patient is receiving PRN medications per MD orders to control. Pt. Very anxious , but denies everything besides anxiety she rates, "5/10". Pt. Given PRN medications to reduce anxieties present with little success. Pt. Unable to provide UDS this evening, will notify day shift of this need.   A: Q x 15 minute observation checks were completed for safety. Patient was provided with education, but needs reinforcement.  Patient was given/offered medications per orders. Patient  was encourage to attend groups, participate in unit activities and continue with plan of care. Pt. Chart and plans of care reviewed. Pt. Given support and encouragement.   R: Patient is complaint with medication and unit procedures with redirection for the most part. Pt. This evening was needed to be put on limit setting interventions to reduce patient severely intrusive and demanding behaviors.              Precautionary checks every 15 minutes for safety maintained, room free of safety hazards, patient sustains no injury or falls during this shift. Will endorse care to next shift.

## 2018-08-30 NOTE — Plan of Care (Signed)
Pt. Denies si/Hi and verbally is able to contract for safety. Pt. Reports she is able to remain safe while on the unit.   Problem: Safety: Goal: Ability to disclose and discuss suicidal ideas will improve Outcome: Progressing   Problem: Safety: Goal: Ability to remain free from injury will improve Outcome: Progressing

## 2018-08-30 NOTE — Progress Notes (Signed)
Recreation Therapy Notes  Date: 08/30/2018  Time: 9:30 am  Location: Craft Room  Behavioral response: Appropriate    Intervention Topic: Coping  Discussion/Intervention:  Group content on today was focused on coping skills. The group defined what coping skills are and when they can be used. Individuals described how they normally cope with things and the coping skills they normally use. Patients expressed why it is important to cope with things and how not coping with things can affect you. The group participated in the intervention "My coping box" and made coping boxes while adding coping skills they could use in the future to the box.  Clinical Observations/Feedback:  Patient came to group and defined coping skills as dealing with the problem. She stated she normally sorts out her problems one by one. Individual stated she uses her faith as a positive coping skill. Patient was social with peers and staff while participating in the intervention.  Emmarie Sannes LRT/CTRS         Terrall Bley 08/30/2018 11:31 AM

## 2018-08-30 NOTE — Progress Notes (Addendum)
[facility] MD Progress Note  08/31/2018 10:01 AM Leslie Wong  MRN:  [id number]  Subjective:    Leslie Wong refuses to accept diagnosis of bipolar. She has a history of serious side effects to many medications and refused Latuda but last night she took a dose of Risperdal. Seroquel was considered but the patient and family are worried about sedation.   Leslie Wong had a brief episode of dizziness yesterday night and felt unsteady but did not fall. BP was 89/56 following nighttime medications. She seems better today. BP 105/73. She still talks too much and is quite tangential but very pleasant. She started to participate in programming and seems to "enjor" her stay more. She still complains of back pain from pinched nerve and has been receiving Percocet for it as prescribed by [facility].   She was diagnosed with Parkinson's disease but no treatment has been offered by [facility] neurology as of yet. There is slight tremor of her right hand. She is AMBIDEXTROUS.  Spoke withe her daughter again who will not be able to come to family meeting today as planned but visited last night and is satisfied with the progress patient is making.   In the afternoon, the patient became child-like crying and despairing, complaining of excruciating pain in her back, neck and left arm, demanding pain medication and X-rays for injuries that happen years ago, inconsolable. She also gave [name] a list of vitamins that she has been taking at home including hair, skin and nails preparation.  Principal Problem: Bipolar I disorder, current or most recent episode manic, with psychotic features ([place]) Diagnosis:   Patient Active Problem List   Diagnosis Date Noted  . Bipolar I disorder, current or most recent episode manic, with psychotic features ([place]) [F31.2] 08/28/2018    Priority: High  . Itch of skin [L29.9] 08/15/2018  . Oral ulcer [K12.1] 08/15/2018  . Night sweats [R61] 05/13/2018  . Pupil asymmetry [H57.02]  05/13/2018  . Hx of cold sores [Z86.19] 03/14/2018  . Parkinson disease ([place]) [G20]   . Health care maintenance [Z00.00] 06/23/2017  . HLD (hyperlipidemia) [E78.5] 03/10/2016  . Advance care planning [Z71.89] 03/10/2016  . Generalized abdominal pain [R10.84] 02/18/2015  . Osteopenia [M85.80] 04/18/2014  . Medicare annual wellness visit, initial [Z00.00] 03/18/2014  . Shoulder pain [M25.519] 01/17/2013  . Chronic back pain [M54.9, G89.29] 09/14/2011  . Hypercalcemia [E83.52] 03/25/2010  . NAUSEA WITH VOMITING [R11.2] 05/12/2009  . INCONTINENCE, URGE [N39.41] 04/16/2009  . CARPAL TUNNEL SYNDROME, BILATERAL [G56.00] 09/24/2008  . Tongue irritation [K14.9] 09/24/2008  . URETHRAL STRICTURE [IMO0002] 06/13/2007  . Hypothyroidism [E03.9] 06/05/2007  . DISORDER, BIPOLAR NOS [F31.9] 06/05/2007  . MIGRAINE HEADACHE [G43.909] 06/05/2007  . GASTROPARESIS [K31.84] 06/05/2007  . Gastroesophageal reflux disease with hiatal hernia [K21.9, K44.9] 06/05/2007  . IRRITABLE BOWEL SYNDROME [K58.9] 06/05/2007  . FIBROCYSTIC BREAST DISEASE [N60.19] 06/05/2007   Total Time spent with patient: 30 minutes  Past Psychiatric History: bipolar  Past Medical History:  Past Medical History:  Diagnosis Date  . Back pain   . Cancer ([place])    skin  . Depression    BAD  . Gastroparesis   . GERD (gastroesophageal reflux disease)   . Hypothyroidism   . Insomnia   . Migraines   . Parkinson disease ([place])    per [facility], dx'd 2019  . Recurrent cold sores   . Shoulder bursitis   . Urge incontinence     Past Surgical History:  Procedure Laterality Date  .  ABDOMINAL HYSTERECTOMY  1999   total  . APPENDECTOMY    . ESOPHAGOGASTRODUODENOSCOPY  01/2006   negative except small hiatal hernia  . ESOPHAGOGASTRODUODENOSCOPY  02/24/2015   See report  . LAPAROSCOPY     for endometriosis  . OVARIAN CYST REMOVAL  1990   unilateral  . TONSILLECTOMY AND ADENOIDECTOMY     Family History:  Family History   Problem Relation Age of Onset  . Hypertension Mother   . Arthritis Mother   . Diabetes Mother   . Stroke Mother   . Cancer Father        lung  . Colon cancer Neg Hx   . Breast cancer Neg Hx    Family Psychiatric  History: son with bipolar Social History:  Social History   Substance and Sexual Activity  Alcohol Use No  . Alcohol/week: 0.0 standard drinks     Social History   Substance and Sexual Activity  Drug Use No    Social History   Socioeconomic History  . Marital status: Divorced    Spouse name: Not on file  . Number of children: Not on file  . Years of education: Not on file  . Highest education level: Not on file  Occupational History  . Not on file  Social Needs  . Financial resource strain: Not on file  . Food insecurity:    Worry: Not on file    Inability: Not on file  . Transportation needs:    Medical: Not on file    Non-medical: Not on file  Tobacco Use  . Smoking status: Former Smoker    Packs/day: 1.00    Years: 42.00    Pack years: 42.00    Types: Cigarettes  . Smokeless tobacco: Former Network engineer and Sexual Activity  . Alcohol use: No    Alcohol/week: 0.0 standard drinks  . Drug use: No  . Sexual activity: Never  Lifestyle  . Physical activity:    Days per week: Not on file    Minutes per session: Not on file  . Stress: Not on file  Relationships  . Social connections:    Talks on phone: Not on file    Gets together: Not on file    Attends religious service: Not on file    Active member of club or organization: Not on file    Attends meetings of clubs or organizations: Not on file    Relationship status: Not on file  Other Topics Concern  . Not on file  Social History Narrative   Lives alone.     Has a Counsellor   Additional Social History:                         Sleep: Poor  Appetite:  Poor  Current Medications: Current Facility-Administered Medications  Medication Dose Route Frequency Provider  Last Rate Last Dose  . acetaminophen (TYLENOL) tablet 650 mg  650 mg Oral Q6H PRN [name], [name], MD      . alum & mag hydroxide-simeth (MAALOX/MYLANTA) 200-200-20 MG/5ML suspension 30 mL  30 mL Oral Q4H PRN [name], [name], MD      . cholecalciferol (VITAMIN D) tablet 1,000 Units  1,000 Units Oral Daily [name], [name], MD   1,000 Units at 08/31/18 0830  . famotidine (PEPCID) tablet 20 mg  20 mg Oral Daily Hanne Kegg B, MD   20 mg at 08/31/18 0830  . feeding supplement (ENSURE  ENLIVE) (ENSURE ENLIVE) liquid 237 mL  237 mL Oral TID BM Kingsley Herandez B, MD   237 mL at 08/30/18 2100  . hydrOXYzine (ATARAX/VISTARIL) tablet 50 mg  50 mg Oral TID PRN [name], [name], MD   50 mg at 08/31/18 0527  . levothyroxine (SYNTHROID, LEVOTHROID) tablet 88 mcg  88 mcg Oral QAC breakfast [name], [name], MD   88 mcg at 08/31/18 0831  . magnesium hydroxide (MILK OF MAGNESIA) suspension 30 mL  30 mL Oral Daily PRN [name], [name], MD      . ondansetron (ZOFRAN-ODT) disintegrating tablet 8 mg  8 mg Oral Q8H PRN Yaretzi Ernandez B, MD   8 mg at 08/30/18 1209  . oxyCODONE-acetaminophen (PERCOCET/ROXICET) 5-325 MG per tablet 1 tablet  1 tablet Oral Q6H PRN [name], [name], MD   1 tablet at 08/31/18 0527  . risperiDONE (RISPERDAL) tablet 2 mg  2 mg Oral QHS Jeronica Stlouis B, MD   2 mg at 08/30/18 2127  . temazepam (RESTORIL) capsule 15 mg  15 mg Oral QHS Sallye Lunz B, MD   15 mg at 08/30/18 2127    Lab Results:  No results found for this or any previous visit (from the past 48 hour(s)).  Blood Alcohol level:  Lab Results  Component Value Date   ETH <10 16/09/9603    Metabolic Disorder Labs: Lab Results  Component Value Date   HGBA1C 5.5 08/29/2018   MPG 111.15 08/29/2018   No results found for: PROLACTIN Lab Results  Component Value Date   CHOL 170 08/29/2018   TRIG 83 08/29/2018   HDL 46 08/29/2018   CHOLHDL 3.7 08/29/2018   VLDL 17 08/29/2018   LDLCALC 107 (H)  08/29/2018   LDLCALC 152 (H) 02/24/2014    Physical Findings: AIMS: Facial and Oral Movements Muscles of Facial Expression: None, normal Lips and Perioral Area: None, normal Jaw: None, normal Tongue: None, normal,Extremity Movements Upper (arms, wrists, hands, fingers): None, normal Lower (legs, knees, ankles, toes): None, normal, Trunk Movements Neck, shoulders, hips: None, normal, Overall Severity Severity of abnormal movements (highest score from questions above): None, normal Incapacitation due to abnormal movements: None, normal Patient's awareness of abnormal movements (rate only patient's report): No Awareness, Dental Status Current problems with teeth and/or dentures?: No Does patient usually wear dentures?: No  CIWA:  CIWA-Ar Total: 2 COWS:  COWS Total Score: 3  Musculoskeletal: Strength & Muscle Tone: within normal limits Gait & Station: normal Patient leans: N/A  Psychiatric Specialty Exam: Physical Exam  Nursing note and vitals reviewed. Genitourinary: Uterus normal.  Psychiatric: Her mood appears anxious. Her speech is rapid and/or pressured and tangential. She is hyperactive. Thought content is paranoid and delusional. Cognition and memory are normal. She expresses impulsivity.    Review of Systems  Neurological: Negative.   Psychiatric/Behavioral: The patient is nervous/anxious and has insomnia.   All other systems reviewed and are negative.   Blood pressure 105/73, pulse 97, temperature 98.4 F (36.9 C), temperature source Oral, resp. rate 18, height 4\' 8"  (1.422 m), weight 47.6 kg, SpO2 99 %.Body mass index is 23.54 kg/m.  General Appearance: Casual  Eye Contact:  Good  Speech:  Clear and Coherent and Pressured  Volume:  Normal  Mood:  Anxious and Euphoric  Affect:  Congruent  Thought Process:  Disorganized, Irrelevant and Descriptions of Associations: Tangential  Orientation:  Full (Time, Place, and Person)  Thought Content:  Illogical, Delusions and  Paranoid Ideation  Suicidal Thoughts:  No  Homicidal Thoughts:  No  Memory:  Immediate;   Fair Recent;   Fair Remote;   Fair  Judgement:  Poor  Insight:  Lacking  Psychomotor Activity:  Normal  Concentration:  Concentration: Fair and Attention Span: Fair  Recall:  [employer] of Knowledge:  Fair  Language:  Fair  Akathisia:  No  Handed:  Ambidextrous  AIMS (if indicated):     Assets:  Communication Skills Desire for Improvement Financial Resources/Insurance Housing Resilience Social Support Transportation  ADL's:  Intact  Cognition:  WNL  Sleep:  Number of Hours: 4.75     Treatment Plan Summary: Daily contact with patient to assess and evaluate symptoms and progress in treatment and Medication management   Ms. Shawhan is a 62 year old female with a history of bipolar disorder, noncompliant with medications and no insight, admitted for a manic episode. She now accepts medications.   #Mood and psychosis, improving -refuses Latuda -accepted Risperdal 2 mg last night -will add 1 mg in AM -could use a mood stabilizer!!!  #Insomnia, some improvement with treatment -continue Restoril 15 mg nightly  #Hypothyroidism -Synthroid 88 ug daily  #GERD -Pepcid 20 mg daily  #Chronic pain -Percocet 5 mg QID PRN -has pain contract with [facility] -I will give one time 5 mg Percocet and give Zanaflex that was helpful in the past -lidocaine patch for the back  #Labs -lipid panel and A1C are normal, TSH low with normal T4  -EKG reviewed, sinus rhythm with QTc 417 -UA nad UDS pending  #Disposition -discharge to home -needs a new psychiatrist locally  Orson Slick, MD 08/31/2018, 10:01 AM

## 2018-08-30 NOTE — Progress Notes (Signed)
Received Leslie Wong this AM after breakfast, she was compliant with her medications. She denied all of the psychiatric symptoms including feeling suicidal. She has been OOB in the milieu at intervals and socializing with her peers. No change in her status this PM.

## 2018-08-31 MED ORDER — MAGIC MOUTHWASH
10.0000 mL | Freq: Three times a day (TID) | ORAL | Status: DC | PRN
  Administered 2018-09-02 – 2018-09-03 (×4): 10 mL via ORAL
  Filled 2018-08-31 (×5): qty 10

## 2018-08-31 MED ORDER — MAGIC MOUTHWASH W/LIDOCAINE: 10.0000 mL | Freq: Three times a day (TID) | ORAL | Status: DC | PRN

## 2018-08-31 MED ORDER — OXYCODONE-ACETAMINOPHEN 5-325 MG PO TABS
1.0000 | ORAL_TABLET | Freq: Once | ORAL | Status: AC
  Administered 2018-08-31: 1 via ORAL
  Filled 2018-08-31: qty 1

## 2018-08-31 MED ORDER — TIZANIDINE HCL 4 MG PO TABS
4.0000 mg | ORAL_TABLET | Freq: Three times a day (TID) | ORAL | Status: DC
  Administered 2018-08-31 – 2018-09-04 (×11): 4 mg via ORAL
  Filled 2018-08-31 (×13): qty 1

## 2018-08-31 MED ORDER — LIDOCAINE VISCOUS HCL 2 % MT SOLN
10.0000 mL | Freq: Three times a day (TID) | OROMUCOSAL | Status: DC | PRN
  Filled 2018-08-31: qty 15

## 2018-08-31 MED ORDER — LORAZEPAM 1 MG PO TABS
1.0000 mg | ORAL_TABLET | Freq: Once | ORAL | Status: AC
  Administered 2018-08-31: 1 mg via ORAL
  Filled 2018-08-31: qty 1

## 2018-08-31 NOTE — Progress Notes (Signed)
Patient gave a hand written list of medications to nurse which was presented to Psychiatrist.

## 2018-08-31 NOTE — BHH Group Notes (Signed)
[facility] LCSW Group Therapy Note  Date/Time: 08/31/18, 1300  Type of Therapy and Topic:  Group Therapy:  Feelings around Relapse and Recovery  Participation Level:  Active   Mood:pleasant  Description of Group:    Patients in this group will discuss emotions they experience before and after a relapse. They will process how experiencing these feelings, or avoidance of experiencing them, relates to having a relapse. Facilitator will guide patients to explore emotions they have related to recovery. Patients will be encouraged to process which emotions are more powerful. They will be guided to discuss the emotional reaction significant others in their lives may have to patients' relapse or recovery. Patients will be assisted in exploring ways to respond to the emotions of others without this contributing to a relapse.  Therapeutic Goals: 1. Patient will identify two or more emotions that lead to relapse for them:  2. Patient will identify two emotions that result when they relapse:  3. Patient will identify two emotions related to recovery:  4. Patient will demonstrate ability to communicate their needs through discussion and/or role plays.   Summary of Patient Progress:Pt shared that both her son and her ex husband had substance use issues and that she had been hurt through both of those experiences.  Pt made several statements to the group at large talking about the dangers of addiction.     Therapeutic Modalities:   Cognitive Behavioral Therapy Solution-Focused Therapy Assertiveness Training Relapse Prevention Therapy  Lurline Idol, LCSW
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
## 2018-08-31 NOTE — Progress Notes (Signed)
D: Pt A & O X4. Denies HI and AVH. Endorse passive SI without plan "because of my pain, I feel like I want to die". Pt has been very demanding, needy and med seeking throughout this shift. Observed dancing in hall right after receiving Percocet for pain. Pt was later seen less than an hour crying in hall, holding unto wooden rails "I can't walk, my left hand hurt so much, I stand it". Verbal redirection ineffective. Per pt "I want more medications, talk to the doctor, the medications is not working, that's not my right doses".  A: Introduced self to pt. Emotional support and availability provided to pt as needed. Scheduled and PRN medications given with verbal education and effects monitored. Verbal education done on PRN medications schedules and reassessment but to no avail as pt continues to increase in her demands for more medications. Encouraged pt to voice concerns, attend to ADLs and comply with current treatment regimen. Safety checks maintained at Q 15 minutes intervals without outburst or self harm gestures to note thus far.   R: Pt compliant with medications when offered. Denies adverse drug reactions when assessed. Visible in groups.Tolerates all PO intake fairly. Remains safe on unit.

## 2018-08-31 NOTE — Progress Notes (Signed)
Patient complained of chest pain vs taken b/p elevated, EkG done , medical doctor on call notified; per order "MD stated its not an MI he ordered for her VS to be taken again and she should be monitored. Patient stated " it may be my panic attack" b/p rechecked WNL, patient later expressed relief, nurse an tech at bedside will continue to monitor closely.

## 2018-08-31 NOTE — Progress Notes (Signed)
Recreation Therapy Notes  Date: 08/31/2018  Time: 9:30 am  Location: Craft Room  Behavioral response: Appropriate    Intervention Topic: Happiness  Discussion/Intervention:  Group content today was focused on Happiness. The group defined happiness and stated reasons they are and are not happy at times. Participants identified reasons they are normally happy and why. Individuals expressed how not being happy affects themselves and others. Patients stated reasons why happiness is important to them. The group described how they feel when they are happy. Individuals participated in the intervention "What is happiness" where they defined what happiness means to them.   Clinical Observations/Feedback:  Patient came to group and defined happiness as a mood and a smile. She identified depression and negativity as things that keep her from being happy. Individual was social with peers and staff  while participating in the intervention.   Lia Vigilante LRT/CTRS          Matisyn Cabeza 08/31/2018 1:02 PM

## 2018-08-31 NOTE — Plan of Care (Signed)
  Problem: Education: Goal: Utilization of techniques to improve thought processes will improve Outcome: Progressing  Patientn is using her coping mechanisms to cope.

## 2018-08-31 NOTE — Progress Notes (Signed)
D: Patient denies SI/HI/AVH, affect is blunted, speech is pressured, appears anxious, writer reassured her and assisted her via using cooping mechanisms and breathing techniques. Patient is interacting appropriately with peers and staff.   A: Patient was offered support and encouragement, and was given scheduled medications, and  encouraged to attend groups. 15 minute checks were done for safety R:Pt attends groups and interacts appropriately with peers and staff. Patient is taking medication, and has no complaints.15 minutes safety checks maintained will continue to monitor closely.

## 2018-09-01 LAB — URINALYSIS, COMPLETE (UACMP) WITH MICROSCOPIC
BILIRUBIN URINE: NEGATIVE
Bacteria, UA: NONE SEEN
Glucose, UA: NEGATIVE mg/dL
HGB URINE DIPSTICK: NEGATIVE
KETONES UR: 5 mg/dL — AB
LEUKOCYTES UA: NEGATIVE
NITRITE: NEGATIVE
PH: 8 (ref 5.0–8.0)
Protein, ur: NEGATIVE mg/dL
SPECIFIC GRAVITY, URINE: 1.009 (ref 1.005–1.030)
Squamous Epithelial / LPF: NONE SEEN (ref 0–5)

## 2018-09-01 NOTE — Plan of Care (Signed)
Patient is impulsive and dramatic with pain, described depression as episodic that are occurring few times a day and last for hours, complain of N/V , Zofran is prescribed every 8hrs. with effect . Sleep is intermittent, pain is controled with percocet with effect. Denies any SI/HI and no signs of AVH, no group participation, somatic most of the time, appetite has decrease and concentration is difficult due to N/V, encourage patient to participate in groups for coping mechanizing   Problem: Education: Goal: Utilization of techniques to improve thought processes will improve Outcome: Progressing   Problem: Activity: Goal: Interest or engagement in leisure activities will improve Outcome: Progressing Goal: Imbalance in normal sleep/wake cycle will improve Outcome: Progressing   Problem: Coping: Goal: Coping ability will improve Outcome: Progressing Goal: Will verbalize feelings Outcome: Progressing   Problem: Safety: Goal: Ability to disclose and discuss suicidal ideas will improve Outcome: Progressing Goal: Ability to identify and utilize support systems that promote safety will improve Outcome: Progressing   Problem: Coping: Goal: Ability to identify and develop effective coping behavior will improve Outcome: Progressing Goal: Ability to interact with others will improve Outcome: Progressing Goal: Demonstration of participation in decision-making regarding own care will improve Outcome: Progressing Goal: Ability to use eye contact when communicating with others will improve Outcome: Progressing   Problem: Health Behavior/Discharge Planning: Goal: Identification of resources available to assist in meeting health care needs will improve Outcome: Progressing   Problem: Self-Concept: Goal: Will verbalize positive feelings about self Outcome: Progressing   Problem: Education: Goal: Knowledge of General Education information will improve Description Including pain rating  scale, medication(s)/side effects and non-pharmacologic comfort measures Outcome: Progressing   Problem: Health Behavior/Discharge Planning: Goal: Ability to manage health-related needs will improve Outcome: Progressing   Problem: Clinical Measurements: Goal: Ability to maintain clinical measurements within normal limits will improve Outcome: Progressing Goal: Will remain free from infection Outcome: Progressing Goal: Diagnostic test results will improve Outcome: Progressing Goal: Respiratory complications will improve Outcome: Progressing Goal: Cardiovascular complication will be avoided Outcome: Progressing   Problem: Activity: Goal: Risk for activity intolerance will decrease Outcome: Progressing   Problem: Nutrition: Goal: Adequate nutrition will be maintained Outcome: Progressing   Problem: Coping: Goal: Level of anxiety will decrease Outcome: Progressing   Problem: Elimination: Goal: Will not experience complications related to bowel motility Outcome: Progressing Goal: Will not experience complications related to urinary retention Outcome: Progressing   Problem: Pain Managment: Goal: General experience of comfort will improve Outcome: Progressing   Problem: Safety: Goal: Ability to remain free from injury will improve Outcome: Progressing   Problem: Skin Integrity: Goal: Risk for impaired skin integrity will decrease Outcome: Progressing   .

## 2018-09-01 NOTE — Plan of Care (Signed)
  Problem: Coping: Goal: Will verbalize feelings Outcome: Progressing   Problem: Safety: Goal: Ability to disclose and discuss suicidal ideas will improve Outcome: Progressing   Problem: Education: Goal: Knowledge of General Education information will improve Description Including pain rating scale, medication(s)/side effects and non-pharmacologic comfort measures Outcome: Progressing   Problem: Coping: Goal: Level of anxiety will decrease Outcome: Progressing

## 2018-09-01 NOTE — Progress Notes (Signed)
[facility] MD Progress Note  09/01/2018 3:39 PM Leslie Wong  MRN:  [id number] Subjective: Follow-up note for patient with bipolar disorder.  Patient continues to be hyperverbal and disorganized at times.  When I spoke with her for a while and she started to be allowed to speak at length she gets rather disorganized and tangential and starts to focus on her somatic complaints again but when the conversation is controlled she is able to slow down and follow a back and forth conversation.  She still perseverates on somatic complaints.  For example she complains of terrible chronic pain in both of her wrists.  Despite this she gesticulates with her hands constantly and appears to have no swelling or any difficulty using her hands.  Patient is showing improvement in her symptoms with less mania and less agitation.  No new complaints today.  No signs of any side effects from medicine.  Urine analysis was obtained and is unremarkable. Principal Problem: Bipolar I disorder, current or most recent episode manic, with psychotic features ([place]) Diagnosis:   Patient Active Problem List   Diagnosis Date Noted  . Bipolar I disorder, current or most recent episode manic, with psychotic features ([place]) [F31.2] 08/28/2018  . Itch of skin [L29.9] 08/15/2018  . Oral ulcer [K12.1] 08/15/2018  . Night sweats [R61] 05/13/2018  . Pupil asymmetry [H57.02] 05/13/2018  . Hx of cold sores [Z86.19] 03/14/2018  . Parkinson disease ([place]) [G20]   . Health care maintenance [Z00.00] 06/23/2017  . HLD (hyperlipidemia) [E78.5] 03/10/2016  . Advance care planning [Z71.89] 03/10/2016  . Generalized abdominal pain [R10.84] 02/18/2015  . Osteopenia [M85.80] 04/18/2014  . Medicare annual wellness visit, initial [Z00.00] 03/18/2014  . Shoulder pain [M25.519] 01/17/2013  . Chronic back pain [M54.9, G89.29] 09/14/2011  . Hypercalcemia [E83.52] 03/25/2010  . NAUSEA WITH VOMITING [R11.2] 05/12/2009  . INCONTINENCE, URGE [N39.41]  04/16/2009  . CARPAL TUNNEL SYNDROME, BILATERAL [G56.00] 09/24/2008  . Tongue irritation [K14.9] 09/24/2008  . URETHRAL STRICTURE [IMO0002] 06/13/2007  . Hypothyroidism [E03.9] 06/05/2007  . DISORDER, BIPOLAR NOS [F31.9] 06/05/2007  . MIGRAINE HEADACHE [G43.909] 06/05/2007  . GASTROPARESIS [K31.84] 06/05/2007  . Gastroesophageal reflux disease with hiatal hernia [K21.9, K44.9] 06/05/2007  . IRRITABLE BOWEL SYNDROME [K58.9] 06/05/2007  . FIBROCYSTIC BREAST DISEASE [N60.19] 06/05/2007   Total Time spent with patient: 30 minutes  Past Psychiatric History: History of bipolar disorder apparently last major episode was long ago.  Past Medical History:  Past Medical History:  Diagnosis Date  . Back pain   . Cancer ([place])    skin  . Depression    BAD  . Gastroparesis   . GERD (gastroesophageal reflux disease)   . Hypothyroidism   . Insomnia   . Migraines   . Parkinson disease ([place])    per [facility], dx'd 2019  . Recurrent cold sores   . Shoulder bursitis   . Urge incontinence     Past Surgical History:  Procedure Laterality Date  . ABDOMINAL HYSTERECTOMY  1999   total  . APPENDECTOMY    . ESOPHAGOGASTRODUODENOSCOPY  01/2006   negative except small hiatal hernia  . ESOPHAGOGASTRODUODENOSCOPY  02/24/2015   See report  . LAPAROSCOPY     for endometriosis  . OVARIAN CYST REMOVAL  1990   unilateral  . TONSILLECTOMY AND ADENOIDECTOMY     Family History:  Family History  Problem Relation Age of Onset  . Hypertension Mother   . Arthritis Mother   . Diabetes Mother   . Stroke Mother   .  Cancer Father        lung  . Colon cancer Neg Hx   . Breast cancer Neg Hx    Family Psychiatric  History: See previous note Social History:  Social History   Substance and Sexual Activity  Alcohol Use No  . Alcohol/week: 0.0 standard drinks     Social History   Substance and Sexual Activity  Drug Use No    Social History   Socioeconomic History  . Marital status: Divorced     Spouse name: Not on file  . Number of children: Not on file  . Years of education: Not on file  . Highest education level: Not on file  Occupational History  . Not on file  Social Needs  . Financial resource strain: Not on file  . Food insecurity:    Worry: Not on file    Inability: Not on file  . Transportation needs:    Medical: Not on file    Non-medical: Not on file  Tobacco Use  . Smoking status: Former Smoker    Packs/day: 1.00    Years: 42.00    Pack years: 42.00    Types: Cigarettes  . Smokeless tobacco: Former Network engineer and Sexual Activity  . Alcohol use: No    Alcohol/week: 0.0 standard drinks  . Drug use: No  . Sexual activity: Never  Lifestyle  . Physical activity:    Days per week: Not on file    Minutes per session: Not on file  . Stress: Not on file  Relationships  . Social connections:    Talks on phone: Not on file    Gets together: Not on file    Attends religious service: Not on file    Active member of club or organization: Not on file    Attends meetings of clubs or organizations: Not on file    Relationship status: Not on file  Other Topics Concern  . Not on file  Social History Narrative   Lives alone.     Has a Counsellor   Additional Social History:                         Sleep: Poor  Appetite:  Fair  Current Medications: Current Facility-Administered Medications  Medication Dose Route Frequency Provider Last Rate Last Dose  . acetaminophen (TYLENOL) tablet 650 mg  650 mg Oral Q6H PRN Silvanna Ohmer T, MD      . alum & mag hydroxide-simeth (MAALOX/MYLANTA) 200-200-20 MG/5ML suspension 30 mL  30 mL Oral Q4H PRN Yarissa Reining T, MD      . cholecalciferol (VITAMIN D) tablet 1,000 Units  1,000 Units Oral Daily Dejai Schubach, [name], MD   1,000 Units at 09/01/18 0821  . famotidine (PEPCID) tablet 20 mg  20 mg Oral Daily Pucilowska, Jolanta B, MD   20 mg at 09/01/18 0821  . feeding supplement (ENSURE ENLIVE) (ENSURE  ENLIVE) liquid 237 mL  237 mL Oral TID BM Pucilowska, Jolanta B, MD   237 mL at 09/01/18 1346  . hydrOXYzine (ATARAX/VISTARIL) tablet 50 mg  50 mg Oral TID PRN Marti Acebo, [name], MD   50 mg at 08/31/18 1717  . levothyroxine (SYNTHROID, LEVOTHROID) tablet 88 mcg  88 mcg Oral QAC breakfast Shanna Strength, [name], MD   88 mcg at 09/01/18 1287  . magic mouthwash  10 mL Oral TID PRN Pucilowska, Jolanta B, MD       And  .  lidocaine (XYLOCAINE) 2 % viscous mouth solution 10 mL  10 mL Mouth/Throat TID PRN Pucilowska, Jolanta B, MD      . magnesium hydroxide (MILK OF MAGNESIA) suspension 30 mL  30 mL Oral Daily PRN Deshan Hemmelgarn T, MD      . ondansetron (ZOFRAN-ODT) disintegrating tablet 8 mg  8 mg Oral Q8H PRN Pucilowska, Jolanta B, MD   8 mg at 09/01/18 0001  . oxyCODONE-acetaminophen (PERCOCET/ROXICET) 5-325 MG per tablet 1 tablet  1 tablet Oral Q6H PRN Azhia Siefken, [name], MD   1 tablet at 09/01/18 1200  . risperiDONE (RISPERDAL) tablet 2 mg  2 mg Oral QHS Pucilowska, Jolanta B, MD   2 mg at 08/31/18 2149  . temazepam (RESTORIL) capsule 15 mg  15 mg Oral QHS Pucilowska, Jolanta B, MD   15 mg at 08/31/18 2149  . tiZANidine (ZANAFLEX) tablet 4 mg  4 mg Oral TID Pucilowska, Jolanta B, MD   4 mg at 09/01/18 1200    Lab Results:  Results for orders placed or performed during the hospital encounter of 08/28/18 (from the past 48 hour(s))  Urinalysis, Complete w Microscopic     Status: Abnormal   Collection Time: 09/01/18  6:00 AM  Result Value Ref Range   Color, Urine YELLOW (A) YELLOW   APPearance CLEAR (A) CLEAR   Specific Gravity, Urine 1.009 1.005 - 1.030   pH 8.0 5.0 - 8.0   Glucose, UA NEGATIVE NEGATIVE mg/dL   Hgb urine dipstick NEGATIVE NEGATIVE   Bilirubin Urine NEGATIVE NEGATIVE   Ketones, ur 5 (A) NEGATIVE mg/dL   Protein, ur NEGATIVE NEGATIVE mg/dL   Nitrite NEGATIVE NEGATIVE   Leukocytes, UA NEGATIVE NEGATIVE   RBC / HPF 0-5 0 - 5 RBC/hpf   WBC, UA 0-5 0 - 5 WBC/hpf   Bacteria, UA NONE SEEN NONE  SEEN   Squamous Epithelial / LPF NONE SEEN 0 - 5    Comment: Performed at [facility], [address] [place], [place] [address]    Blood Alcohol level:  Lab Results  Component Value Date   [facility] <10 [id number]    Metabolic Disorder Labs: Lab Results  Component Value Date   HGBA1C 5.5 08/29/2018   MPG 111.15 08/29/2018   No results found for: PROLACTIN Lab Results  Component Value Date   CHOL 170 08/29/2018   TRIG 83 08/29/2018   HDL 46 08/29/2018   CHOLHDL 3.7 08/29/2018   VLDL 17 08/29/2018   LDLCALC 107 (H) 08/29/2018   LDLCALC 152 (H) 02/24/2014    Physical Findings: AIMS: Facial and Oral Movements Muscles of Facial Expression: None, normal Lips and Perioral Area: None, normal Jaw: None, normal Tongue: None, normal,Extremity Movements Upper (arms, wrists, hands, fingers): None, normal Lower (legs, knees, ankles, toes): None, normal, Trunk Movements Neck, shoulders, hips: None, normal, Overall Severity Severity of abnormal movements (highest score from questions above): None, normal Incapacitation due to abnormal movements: None, normal Patient's awareness of abnormal movements (rate only patient's report): No Awareness, Dental Status Current problems with teeth and/or dentures?: No Does patient usually wear dentures?: No  CIWA:  CIWA-Ar Total: 2 COWS:  COWS Total Score: 3  Musculoskeletal: Strength & Muscle Tone: within normal limits Gait & Station: normal Patient leans: N/A  Psychiatric Specialty Exam: Physical Exam  Nursing note and vitals reviewed. Constitutional: She appears well-developed and well-nourished.  HENT:  Head: Normocephalic and atraumatic.  Eyes: Pupils are equal, round, and reactive to light. Conjunctivae are normal.  Neck: Normal  range of motion.  Cardiovascular: Regular rhythm and normal heart sounds.  Respiratory: Effort normal.  GI: Soft.  Musculoskeletal: Normal range of motion.  Neurological: She is alert.  Skin:  Skin is warm and dry.  Psychiatric: Her affect is labile. Her speech is rapid and/or pressured and tangential. She is agitated. She is not aggressive. Thought content is not paranoid. Cognition and memory are impaired. She expresses impulsivity and inappropriate judgment. She expresses no homicidal and no suicidal ideation.    Review of Systems  Constitutional: Negative.   HENT: Negative.   Eyes: Negative.   Respiratory: Negative.   Cardiovascular: Negative.   Gastrointestinal: Negative.   Musculoskeletal: Positive for joint pain.  Skin: Negative.   Neurological: Negative.   Psychiatric/Behavioral: Negative for depression, hallucinations, memory loss, substance abuse and suicidal ideas. The patient is not nervous/anxious and does not have insomnia.     Blood pressure 113/77, pulse (!) 111, temperature 98.3 F (36.8 C), temperature source Oral, resp. rate 16, height 4\' 8"  (1.422 m), weight 47.6 kg, SpO2 97 %.Body mass index is 23.54 kg/m.  General Appearance: Fairly Groomed  Eye Contact:  Good  Speech:  Pressured  Volume:  Increased  Mood:  Euphoric  Affect:  Labile  Thought Process:  Disorganized  Orientation:  Full (Time, Place, and Person)  Thought Content:  Illogical  Suicidal Thoughts:  No  Homicidal Thoughts:  No  Memory:  Immediate;   Fair Recent;   Fair Remote;   Fair  Judgement:  Impaired  Insight:  Shallow  Psychomotor Activity:  Increased  Concentration:  Concentration: Fair  Recall:  [employer] of Knowledge:  Fair  Language:  Fair  Akathisia:  No  Handed:  Right  AIMS (if indicated):     Assets:  Financial Resources/Insurance Housing Social Support  ADL's:  Intact  Cognition:  WNL  Sleep:  Number of Hours: 4.75     Treatment Plan Summary: Daily contact with patient to assess and evaluate symptoms and progress in treatment, Medication management and Plan Patient will continue on current medicine for bipolar disorder.  Psychoeducation and encouragement  provided to the patient.  She continues to have daily spells of complaining about physical symptoms often in a sudden and dramatic way to the staff.  Spoke with staff today and we are trying to keep an eye on that and keep it under control as she really seems to be medically doing fine.  Continue current medicine.  Hope that we can get better sleep tonight.  Glad she is eating better.  Alethia Berthold, MD 09/01/2018, 3:39 PM

## 2018-09-01 NOTE — [facility] Group Notes (Signed)
LCSW Group Therapy Note   09/01/2018 1:15pm   Type of Therapy and Topic:  Group Therapy:  Trust and Honesty  Participation Level:  Active  Description of Group:    In this group patients will be asked to explore the value of being honest.  Patients will be guided to discuss their thoughts, feelings, and behaviors related to honesty and trusting in others. Patients will process together how trust and honesty relate to forming relationships with peers, family members, and self. Each patient will be challenged to identify and express feelings of being vulnerable. Patients will discuss reasons why people are dishonest and identify alternative outcomes if one was truthful (to self or others). This group will be process-oriented, with patients participating in exploration of their own experiences, giving and receiving support, and processing challenge from other group members.   Therapeutic Goals: 1. Patient will identify why honesty is important to relationships and how honesty overall affects relationships.  2. Patient will identify a situation where they lied or were lied too and the  feelings, thought process, and behaviors surrounding the situation 3. Patient will identify the meaning of being vulnerable, how that feels, and how that correlates to being honest with self and others. 4. Patient will identify situations where they could have told the truth, but instead lied and explain reasons of dishonesty.   Summary of Patient Progress: The patient reported that she feels "better." The patient was able to explore the value of being honest.  Patient discussed thoughts, feelings, and behaviors related to honesty and trusting in others. The patient processed together with other group members how trust and honesty relate to forming relationships with peers, family members, and self. Pt actively and appropriately engaged in the group. Patient was able to provide support and validation to other group members.  Patient practiced active listening when interacting with the facilitator and other group members.     Therapeutic Modalities:   Cognitive Behavioral Therapy Solution Focused Therapy Motivational Interviewing Brief Therapy  Pattricia Weiher  CUEBAS-COLON, LCSW 09/01/2018 12:56 PM

## 2018-09-01 NOTE — Progress Notes (Signed)
Patient ID: Leslie Wong, female   DOB: 11-13-1956, 62 y.o.   MRN: [id number]  Per State regulations 482.30 this chart was reviewed for medical necessity with respect to the patient's admission/duration of stay.    Next review date: 09/05/18  Debarah Crape, BSN, RN-BC  Case Manager

## 2018-09-01 NOTE — [facility] Group Notes (Signed)
[place] Group Notes:  (Nursing/MHT/Case Management/Adjunct)  Date:  09/01/2018  Time:  1:07 AM  Type of Therapy:  Group Therapy  Participation Level:  Did Not Attend    Leslie Wong 09/01/2018, 1:07 AM

## 2018-09-01 NOTE — Progress Notes (Signed)
Patient sleepy most for morning. Denies SI, HI, AVH. No complaints of pain thus far. Patient continues to use the call bell to ask questions. Medications given as prescribed. Patient intrusive at times. Patient has been appropriate with staff and peers thus far.  Encouragement and support offered. Safety checks maintained.

## 2018-09-02 MED ORDER — RISPERIDONE 1 MG PO TABS
3.0000 mg | ORAL_TABLET | Freq: Every day | ORAL | Status: DC
  Administered 2018-09-02 – 2018-09-03 (×2): 3 mg via ORAL
  Filled 2018-09-02 (×2): qty 3

## 2018-09-02 MED ORDER — CARBAMAZEPINE 200 MG PO TABS
200.0000 mg | ORAL_TABLET | Freq: Two times a day (BID) | ORAL | Status: DC
  Administered 2018-09-02 – 2018-09-04 (×4): 200 mg via ORAL
  Filled 2018-09-02 (×4): qty 1

## 2018-09-02 MED ORDER — TEMAZEPAM 15 MG PO CAPS
30.0000 mg | ORAL_CAPSULE | Freq: Every day | ORAL | Status: DC
  Administered 2018-09-02: 30 mg via ORAL
  Filled 2018-09-02: qty 2

## 2018-09-02 NOTE — Progress Notes (Signed)
[facility] MD Progress Note  09/02/2018 11:12 AM Leslie Wong  MRN:  [id number] Subjective: Follow-up patient with mania.  Patient's only complaint today is joint pain and back pain none of which seems to be limiting her activities.  Mood is described as good.  Patient is still scattered in her thinking and hyperverbal but has calm down a lot of her most obvious behavior problems. Principal Problem: Bipolar I disorder, current or most recent episode manic, with psychotic features ([place]) Diagnosis:   Patient Active Problem List   Diagnosis Date Noted  . Bipolar I disorder, current or most recent episode manic, with psychotic features ([place]) [F31.2] 08/28/2018  . Itch of skin [L29.9] 08/15/2018  . Oral ulcer [K12.1] 08/15/2018  . Night sweats [R61] 05/13/2018  . Pupil asymmetry [H57.02] 05/13/2018  . Hx of cold sores [Z86.19] 03/14/2018  . Parkinson disease ([place]) [G20]   . Health care maintenance [Z00.00] 06/23/2017  . HLD (hyperlipidemia) [E78.5] 03/10/2016  . Advance care planning [Z71.89] 03/10/2016  . Generalized abdominal pain [R10.84] 02/18/2015  . Osteopenia [M85.80] 04/18/2014  . Medicare annual wellness visit, initial [Z00.00] 03/18/2014  . Shoulder pain [M25.519] 01/17/2013  . Chronic back pain [M54.9, G89.29] 09/14/2011  . Hypercalcemia [E83.52] 03/25/2010  . NAUSEA WITH VOMITING [R11.2] 05/12/2009  . INCONTINENCE, URGE [N39.41] 04/16/2009  . CARPAL TUNNEL SYNDROME, BILATERAL [G56.00] 09/24/2008  . Tongue irritation [K14.9] 09/24/2008  . URETHRAL STRICTURE [IMO0002] 06/13/2007  . Hypothyroidism [E03.9] 06/05/2007  . DISORDER, BIPOLAR NOS [F31.9] 06/05/2007  . MIGRAINE HEADACHE [G43.909] 06/05/2007  . GASTROPARESIS [K31.84] 06/05/2007  . Gastroesophageal reflux disease with hiatal hernia [K21.9, K44.9] 06/05/2007  . IRRITABLE BOWEL SYNDROME [K58.9] 06/05/2007  . FIBROCYSTIC BREAST DISEASE [N60.19] 06/05/2007   Total Time spent with patient: 20 minutes  Past Psychiatric  History: Past history of bipolar disorder  Past Medical History:  Past Medical History:  Diagnosis Date  . Back pain   . Cancer ([place])    skin  . Depression    BAD  . Gastroparesis   . GERD (gastroesophageal reflux disease)   . Hypothyroidism   . Insomnia   . Migraines   . Parkinson disease ([place])    per [place], dx'd 2019  . Recurrent cold sores   . Shoulder bursitis   . Urge incontinence     Past Surgical History:  Procedure Laterality Date  . ABDOMINAL HYSTERECTOMY  1999   total  . APPENDECTOMY    . ESOPHAGOGASTRODUODENOSCOPY  01/2006   negative except small hiatal hernia  . ESOPHAGOGASTRODUODENOSCOPY  02/24/2015   See report  . LAPAROSCOPY     for endometriosis  . OVARIAN CYST REMOVAL  1990   unilateral  . TONSILLECTOMY AND ADENOIDECTOMY     Family History:  Family History  Problem Relation Age of Onset  . Hypertension Mother   . Arthritis Mother   . Diabetes Mother   . Stroke Mother   . Cancer Father        lung  . Colon cancer Neg Hx   . Breast cancer Neg Hx    Family Psychiatric  History: See previous notes Social History:  Social History   Substance and Sexual Activity  Alcohol Use No  . Alcohol/week: 0.0 standard drinks     Social History   Substance and Sexual Activity  Drug Use No    Social History   Socioeconomic History  . Marital status: Divorced    Spouse name: Not on file  . Number of children: Not on  file  . Years of education: Not on file  . Highest education level: Not on file  Occupational History  . Not on file  Social Needs  . Financial resource strain: Not on file  . Food insecurity:    Worry: Not on file    Inability: Not on file  . Transportation needs:    Medical: Not on file    Non-medical: Not on file  Tobacco Use  . Smoking status: Former Smoker    Packs/day: 1.00    Years: 42.00    Pack years: 42.00    Types: Cigarettes  . Smokeless tobacco: Former Network engineer and Sexual Activity  . Alcohol use:  No    Alcohol/week: 0.0 standard drinks  . Drug use: No  . Sexual activity: Never  Lifestyle  . Physical activity:    Days per week: Not on file    Minutes per session: Not on file  . Stress: Not on file  Relationships  . Social connections:    Talks on phone: Not on file    Gets together: Not on file    Attends religious service: Not on file    Active member of club or organization: Not on file    Attends meetings of clubs or organizations: Not on file    Relationship status: Not on file  Other Topics Concern  . Not on file  Social History Narrative   Lives alone.     Has a Counsellor   Additional Social History:                         Sleep: Fair  Appetite:  Fair  Current Medications: Current Facility-Administered Medications  Medication Dose Route Frequency Provider Last Rate Last Dose  . acetaminophen (TYLENOL) tablet 650 mg  650 mg Oral Q6H PRN Myriam Brandhorst T, MD      . alum & mag hydroxide-simeth (MAALOX/MYLANTA) 200-200-20 MG/5ML suspension 30 mL  30 mL Oral Q4H PRN Renleigh Ouellet T, MD      . cholecalciferol (VITAMIN D) tablet 1,000 Units  1,000 Units Oral Daily Alexea Blase, [name], MD   1,000 Units at 09/02/18 1001  . famotidine (PEPCID) tablet 20 mg  20 mg Oral Daily Pucilowska, Jolanta B, MD   20 mg at 09/02/18 1001  . feeding supplement (ENSURE ENLIVE) (ENSURE ENLIVE) liquid 237 mL  237 mL Oral TID BM Pucilowska, Jolanta B, MD   237 mL at 09/02/18 1002  . hydrOXYzine (ATARAX/VISTARIL) tablet 50 mg  50 mg Oral TID PRN Sander Speckman, [name], MD   50 mg at 09/02/18 0324  . levothyroxine (SYNTHROID, LEVOTHROID) tablet 88 mcg  88 mcg Oral QAC breakfast Cheridan Kibler, [name], MD   88 mcg at 09/02/18 0710  . magic mouthwash  10 mL Oral TID PRN Pucilowska, Jolanta B, MD       And  . lidocaine (XYLOCAINE) 2 % viscous mouth solution 10 mL  10 mL Mouth/Throat TID PRN Pucilowska, Jolanta B, MD      . magnesium hydroxide (MILK OF MAGNESIA) suspension 30 mL  30 mL Oral Daily  PRN Anjela Cassara T, MD      . ondansetron (ZOFRAN-ODT) disintegrating tablet 8 mg  8 mg Oral Q8H PRN Pucilowska, Jolanta B, MD   8 mg at 09/01/18 0001  . oxyCODONE-acetaminophen (PERCOCET/ROXICET) 5-325 MG per tablet 1 tablet  1 tablet Oral Q6H PRN Naftali Carchi, [name], MD   1 tablet at 09/02/18  1001  . risperiDONE (RISPERDAL) tablet 2 mg  2 mg Oral QHS Pucilowska, Jolanta B, MD   2 mg at 09/01/18 2150  . temazepam (RESTORIL) capsule 15 mg  15 mg Oral QHS Pucilowska, Jolanta B, MD   15 mg at 09/01/18 2151  . tiZANidine (ZANAFLEX) tablet 4 mg  4 mg Oral TID Pucilowska, Jolanta B, MD   4 mg at 09/02/18 1001    Lab Results:  Results for orders placed or performed during the hospital encounter of 08/28/18 (from the past 48 hour(s))  Urinalysis, Complete w Microscopic     Status: Abnormal   Collection Time: 09/01/18  6:00 AM  Result Value Ref Range   Color, Urine YELLOW (A) YELLOW   APPearance CLEAR (A) CLEAR   Specific Gravity, Urine 1.009 1.005 - 1.030   pH 8.0 5.0 - 8.0   Glucose, UA NEGATIVE NEGATIVE mg/dL   Hgb urine dipstick NEGATIVE NEGATIVE   Bilirubin Urine NEGATIVE NEGATIVE   Ketones, ur 5 (A) NEGATIVE mg/dL   Protein, ur NEGATIVE NEGATIVE mg/dL   Nitrite NEGATIVE NEGATIVE   Leukocytes, UA NEGATIVE NEGATIVE   RBC / HPF 0-5 0 - 5 RBC/hpf   WBC, UA 0-5 0 - 5 WBC/hpf   Bacteria, UA NONE SEEN NONE SEEN   Squamous Epithelial / LPF NONE SEEN 0 - 5    Comment: Performed at [facility], [address] [place], [place] [address]    Blood Alcohol level:  Lab Results  Component Value Date   [facility] <10 47/42/5956    Metabolic Disorder Labs: Lab Results  Component Value Date   HGBA1C 5.5 08/29/2018   MPG 111.15 08/29/2018   No results found for: PROLACTIN Lab Results  Component Value Date   CHOL 170 08/29/2018   TRIG 83 08/29/2018   HDL 46 08/29/2018   CHOLHDL 3.7 08/29/2018   VLDL 17 08/29/2018   LDLCALC 107 (H) 08/29/2018   LDLCALC 152 (H) 02/24/2014    Physical  Findings: AIMS: Facial and Oral Movements Muscles of Facial Expression: None, normal Lips and Perioral Area: None, normal Jaw: None, normal Tongue: None, normal,Extremity Movements Upper (arms, wrists, hands, fingers): None, normal Lower (legs, knees, ankles, toes): None, normal, Trunk Movements Neck, shoulders, hips: None, normal, Overall Severity Severity of abnormal movements (highest score from questions above): None, normal Incapacitation due to abnormal movements: None, normal Patient's awareness of abnormal movements (rate only patient's report): No Awareness, Dental Status Current problems with teeth and/or dentures?: No Does patient usually wear dentures?: No  CIWA:  CIWA-Ar Total: 2 COWS:  COWS Total Score: 3  Musculoskeletal: Strength & Muscle Tone: within normal limits Gait & Station: normal Patient leans: N/A  Psychiatric Specialty Exam: Physical Exam  Nursing note and vitals reviewed. Constitutional: She appears well-developed and well-nourished.  HENT:  Head: Normocephalic and atraumatic.  Eyes: Pupils are equal, round, and reactive to light. Conjunctivae are normal.  Neck: Normal range of motion.  Cardiovascular: Regular rhythm and normal heart sounds.  Respiratory: Effort normal. No respiratory distress.  GI: Soft.  Musculoskeletal: Normal range of motion.  Neurological: She is alert.  Skin: Skin is warm and dry.  Psychiatric: Her affect is labile. Her speech is tangential. She is agitated. She is not aggressive. Thought content is not paranoid. Cognition and memory are impaired. She expresses impulsivity. She expresses no homicidal and no suicidal ideation.    Review of Systems  Constitutional: Negative.   HENT: Negative.   Eyes: Negative.   Respiratory: Negative.  Cardiovascular: Negative.   Gastrointestinal: Negative.   Musculoskeletal: Positive for back pain and joint pain.  Skin: Negative.   Neurological: Negative.   Psychiatric/Behavioral:  Negative.     Blood pressure 99/73, pulse 100, temperature 98.2 F (36.8 C), temperature source Oral, resp. rate 18, height 4\' 8"  (1.422 m), weight 47.6 kg, SpO2 97 %.Body mass index is 23.54 kg/m.  General Appearance: Casual  Eye Contact:  Good  Speech:  Clear and Coherent  Volume:  Increased  Mood:  Euphoric  Affect:  Congruent  Thought Process:  Goal Directed  Orientation:  Full (Time, Place, and Person)  Thought Content:  Logical  Suicidal Thoughts:  No  Homicidal Thoughts:  No  Memory:  Immediate;   Fair Recent;   Fair Remote;   Fair  Judgement:  Fair  Insight:  Fair  Psychomotor Activity:  Normal  Concentration:  Concentration: Fair  Recall:  [employer] of Knowledge:  Fair  Language:  Fair  Akathisia:  No  Handed:  Right  AIMS (if indicated):     Assets:  Desire for Improvement Housing Resilience Social Support  ADL's:  Intact  Cognition:  WNL  Sleep:  Number of Hours: 3     Treatment Plan Summary: Daily contact with patient to assess and evaluate symptoms and progress in treatment, Medication management and Plan Still does not sleep much.  Still hyperactive and hyperverbal.  Did increase the dose of her medicine yesterday.  Patient will continue on current medication and continue ongoing assessment on 15-minute checks.  No change to any other plan at this point.  Alethia Berthold, MD 09/02/2018, 11:12 AM

## 2018-09-02 NOTE — Progress Notes (Signed)
D: Patient is alert and oriented x 4, she denies SI/HI/AVH, affect is blunted, speech is less pressured non tangential, she appears less anxious, thoughts are organized and she is  interacting appropriately with peers and staff.   A: Patient was offered support and encouragement, and was given scheduled medications, and  encouraged to attend groups. 15 minute checks were done for safety R:Pt attends groups and interacts appropriately with peers and staff. Patient is taking medication, and has no complaints.15 minutes safety checks maintained will continue to monitor closely.

## 2018-09-02 NOTE — Plan of Care (Signed)
  Problem: Education: Goal: Utilization of techniques to improve thought processes will improve Outcome: Progressing   Problem: Activity: Goal: Interest or engagement in leisure activities will improve Outcome: Progressing   Problem: Coping: Goal: Coping ability will improve Outcome: Progressing Goal: Will verbalize feelings Outcome: Progressing   Problem: Safety: Goal: Ability to disclose and discuss suicidal ideas will improve Outcome: Progressing   Problem: Coping: Goal: Ability to interact with others will improve Outcome: Progressing   Problem: Pain Managment: Goal: General experience of comfort will improve Outcome: Progressing   Problem: Education: Goal: Utilization of techniques to improve thought processes will improve Outcome: Progressing   Problem: Activity: Goal: Interest or engagement in leisure activities will improve Outcome: Progressing   Problem: Coping: Goal: Coping ability will improve Outcome: Progressing Goal: Will verbalize feelings Outcome: Progressing   Problem: Safety: Goal: Ability to disclose and discuss suicidal ideas will improve Outcome: Progressing   Problem: Coping: Goal: Ability to interact with others will improve Outcome: Progressing   Problem: Pain Managment: Goal: General experience of comfort will improve Outcome: Progressing

## 2018-09-02 NOTE — Progress Notes (Addendum)
[facility] MD Progress Note  09/03/2018 6:30 PM Leslie Wong  MRN:  [id number]  Subjective:    Leslie Wong appears more relaxed and cohesive today. She is out pf her room and interacts with peers and staff appropriately. She complains of pain but no more screaming or drama. She accepted a mood stabilized over the weekend in addition to Risperdal and seems to tolerate them well. Sleep and appetite are fair.   Principal Problem: Bipolar I disorder, current or most recent episode manic, with psychotic features ([place]) Diagnosis:   Patient Active Problem List   Diagnosis Date Noted  . Bipolar I disorder, current or most recent episode manic, with psychotic features ([place]) [F31.2] 08/28/2018    Priority: High  . Itch of skin [L29.9] 08/15/2018  . Oral ulcer [K12.1] 08/15/2018  . Night sweats [R61] 05/13/2018  . Pupil asymmetry [H57.02] 05/13/2018  . Hx of cold sores [Z86.19] 03/14/2018  . Parkinson disease ([place]) [G20]   . Health care maintenance [Z00.00] 06/23/2017  . HLD (hyperlipidemia) [E78.5] 03/10/2016  . Advance care planning [Z71.89] 03/10/2016  . Generalized abdominal pain [R10.84] 02/18/2015  . Osteopenia [M85.80] 04/18/2014  . Medicare annual wellness visit, initial [Z00.00] 03/18/2014  . Shoulder pain [M25.519] 01/17/2013  . Chronic back pain [M54.9, G89.29] 09/14/2011  . Hypercalcemia [E83.52] 03/25/2010  . NAUSEA WITH VOMITING [R11.2] 05/12/2009  . INCONTINENCE, URGE [N39.41] 04/16/2009  . CARPAL TUNNEL SYNDROME, BILATERAL [G56.00] 09/24/2008  . Tongue irritation [K14.9] 09/24/2008  . URETHRAL STRICTURE [IMO0002] 06/13/2007  . Hypothyroidism [E03.9] 06/05/2007  . DISORDER, BIPOLAR NOS [F31.9] 06/05/2007  . MIGRAINE HEADACHE [G43.909] 06/05/2007  . GASTROPARESIS [K31.84] 06/05/2007  . Gastroesophageal reflux disease with hiatal hernia [K21.9, K44.9] 06/05/2007  . IRRITABLE BOWEL SYNDROME [K58.9] 06/05/2007  . FIBROCYSTIC BREAST DISEASE [N60.19] 06/05/2007   Total  Time spent with patient: 20 minutes  Past Psychiatric History: bipolar disoder  Past Medical History:  Past Medical History:  Diagnosis Date  . Back pain   . Cancer ([place])    skin  . Depression    BAD  . Gastroparesis   . GERD (gastroesophageal reflux disease)   . Hypothyroidism   . Insomnia   . Migraines   . Parkinson disease ([place])    per [place] [facility], dx'd 2019  . Recurrent cold sores   . Shoulder bursitis   . Urge incontinence     Past Surgical History:  Procedure Laterality Date  . ABDOMINAL HYSTERECTOMY  1999   total  . APPENDECTOMY    . ESOPHAGOGASTRODUODENOSCOPY  01/2006   negative except small hiatal hernia  . ESOPHAGOGASTRODUODENOSCOPY  02/24/2015   See report  . LAPAROSCOPY     for endometriosis  . OVARIAN CYST REMOVAL  1990   unilateral  . TONSILLECTOMY AND ADENOIDECTOMY     Family History:  Family History  Problem Relation Age of Onset  . Hypertension Mother   . Arthritis Mother   . Diabetes Mother   . Stroke Mother   . Cancer Father        lung  . Colon cancer Neg Hx   . Breast cancer Neg Hx    Family Psychiatric  History: bipolar Social History:  Social History   Substance and Sexual Activity  Alcohol Use No  . Alcohol/week: 0.0 standard drinks     Social History   Substance and Sexual Activity  Drug Use No    Social History   Socioeconomic History  . Marital status: Divorced    Spouse name: Not on  file  . Number of children: Not on file  . Years of education: Not on file  . Highest education level: Not on file  Occupational History  . Not on file  Social Needs  . Financial resource strain: Not on file  . Food insecurity:    Worry: Not on file    Inability: Not on file  . Transportation needs:    Medical: Not on file    Non-medical: Not on file  Tobacco Use  . Smoking status: Former Smoker    Packs/day: 1.00    Years: 42.00    Pack years: 42.00    Types: Cigarettes  . Smokeless tobacco: Former Network engineer and  Sexual Activity  . Alcohol use: No    Alcohol/week: 0.0 standard drinks  . Drug use: No  . Sexual activity: Never  Lifestyle  . Physical activity:    Days per week: Not on file    Minutes per session: Not on file  . Stress: Not on file  Relationships  . Social connections:    Talks on phone: Not on file    Gets together: Not on file    Attends religious service: Not on file    Active member of club or organization: Not on file    Attends meetings of clubs or organizations: Not on file    Relationship status: Not on file  Other Topics Concern  . Not on file  Social History Narrative   Lives alone.     Has a Counsellor   Additional Social History:                         Sleep: Fair  Appetite:  Fair  Current Medications: Current Facility-Administered Medications  Medication Dose Route Frequency Provider Last Rate Last Dose  . acetaminophen (TYLENOL) tablet 650 mg  650 mg Oral Q6H PRN [name], [name], MD      . alum & mag hydroxide-simeth (MAALOX/MYLANTA) 200-200-20 MG/5ML suspension 30 mL  30 mL Oral Q4H PRN [name], [name], MD      . carbamazepine (TEGRETOL) tablet 200 mg  200 mg Oral Q12H [name], [name], MD   200 mg at 09/03/18 0751  . cholecalciferol (VITAMIN D) tablet 1,000 Units  1,000 Units Oral Daily [name], [name], MD   1,000 Units at 09/03/18 0751  . famotidine (PEPCID) tablet 20 mg  20 mg Oral Daily Wilbur Oakland B, MD   20 mg at 09/03/18 0751  . feeding supplement (ENSURE ENLIVE) (ENSURE ENLIVE) liquid 237 mL  237 mL Oral TID BM Phoenix Riesen B, MD   237 mL at 09/02/18 2044  . hydrOXYzine (ATARAX/VISTARIL) tablet 50 mg  50 mg Oral TID PRN [name], [name], MD   50 mg at 09/02/18 1212  . levothyroxine (SYNTHROID, LEVOTHROID) tablet 88 mcg  88 mcg Oral QAC breakfast [name], [name], MD   88 mcg at 09/03/18 [phone]  . magic mouthwash  10 mL Oral TID PRN Maly Lemarr B, MD   10 mL at 09/03/18 1432   And  . lidocaine (XYLOCAINE) 2 %  viscous mouth solution 10 mL  10 mL Mouth/Throat TID PRN Elishia Kaczorowski B, MD      . magnesium hydroxide (MILK OF MAGNESIA) suspension 30 mL  30 mL Oral Daily PRN [name], [name], MD   30 mL at 09/03/18 1757  . ondansetron (ZOFRAN-ODT) disintegrating tablet 8 mg  8 mg Oral Q8H PRN  Francheska Villeda B, MD   8 mg at 09/01/18 0001  . oxyCODONE-acetaminophen (PERCOCET/ROXICET) 5-325 MG per tablet 1 tablet  1 tablet Oral Q6H PRN [name], [name], MD   1 tablet at 09/03/18 1432  . risperiDONE (RISPERDAL) tablet 3 mg  3 mg Oral QHS [name], [name], MD   3 mg at 09/02/18 2151  . temazepam (RESTORIL) capsule 15 mg  15 mg Oral QHS Der Gagliano B, MD      . tiZANidine (ZANAFLEX) tablet 4 mg  4 mg Oral TID Siyah Mault B, MD   4 mg at 09/03/18 1757    Lab Results:  No results found for this or any previous visit (from the past 48 hour(s)).  Blood Alcohol level:  Lab Results  Component Value Date   ETH <10 09/73/5329    Metabolic Disorder Labs: Lab Results  Component Value Date   HGBA1C 5.5 08/29/2018   MPG 111.15 08/29/2018   No results found for: PROLACTIN Lab Results  Component Value Date   CHOL 170 08/29/2018   TRIG 83 08/29/2018   HDL 46 08/29/2018   CHOLHDL 3.7 08/29/2018   VLDL 17 08/29/2018   LDLCALC 107 (H) 08/29/2018   LDLCALC 152 (H) 02/24/2014    Physical Findings: AIMS: Facial and Oral Movements Muscles of Facial Expression: None, normal Lips and Perioral Area: None, normal Jaw: None, normal Tongue: None, normal,Extremity Movements Upper (arms, wrists, hands, fingers): None, normal Lower (legs, knees, ankles, toes): None, normal, Trunk Movements Neck, shoulders, hips: None, normal, Overall Severity Severity of abnormal movements (highest score from questions above): None, normal Incapacitation due to abnormal movements: None, normal Patient's awareness of abnormal movements (rate only patient's report): No Awareness, Dental Status Current problems  with teeth and/or dentures?: No Does patient usually wear dentures?: No  CIWA:  CIWA-Ar Total: 2 COWS:  COWS Total Score: 3  Musculoskeletal: Strength & Muscle Tone: within normal limits Gait & Station: normal Patient leans: N/A  Psychiatric Specialty Exam: Physical Exam  Nursing note and vitals reviewed. Psychiatric: Her affect is labile. Her speech is rapid and/or pressured. She is actively hallucinating. Thought content is paranoid and delusional. Cognition and memory are impaired. She expresses impulsivity.    Review of Systems  Neurological: Negative.   Psychiatric/Behavioral: Positive for hallucinations.  All other systems reviewed and are negative.   Blood pressure (!) 116/101, pulse 100, temperature 97.8 F (36.6 C), temperature source Oral, resp. rate 18, height 4\' 8"  (1.422 m), weight 47.6 kg, SpO2 100 %.Body mass index is 23.54 kg/m.  General Appearance: Casual  Eye Contact:  Good  Speech:  Pressured  Volume:  Normal  Mood:  Dysphoric  Affect:  Labile  Thought Process:  Goal Directed and Descriptions of Associations: Tangential  Orientation:  Full (Time, Place, and Person)  Thought Content:  Delusions, Hallucinations: Auditory and Ideas of Reference:   Paranoia Delusions  Suicidal Thoughts:  No  Homicidal Thoughts:  No  Memory:  Immediate;   Poor Recent;   Poor Remote;   Poor  Judgement:  Poor  Insight:  Lacking  Psychomotor Activity:  Increased  Concentration:  Concentration: Poor and Attention Span: Poor  Recall:  Poor  Fund of Knowledge:  Poor  Language:  Poor  Akathisia:  No  Handed:  Ambidextrous  AIMS (if indicated):     Assets:  Communication Skills Desire for Improvement Financial Resources/Insurance Housing Physical Health Resilience Social Support  ADL's:  Intact  Cognition:  WNL  Sleep:  Number of  Hours: 6.75     Treatment Plan Summary: Daily contact with patient to assess and evaluate symptoms and progress in treatment and  Medication management   Leslie Wong is a 62 year old female with a history of bipolar disorder, noncompliant with medications and no insight, admitted for a manic episode. She now accepts medications.   #Mood and psychosis, improving -continue Risperdal 3 mg nightly  -continue Tegretol 200 mg BID  #Insomnia, slept better with treatment -lower Restoril to 15 mg nightly   #Hypothyroidism -Synthroid 88 ug daily  #GERD -Pepcid 20 mg daily  #Chronic pain -Percocet 5 mg QID PRN -has pain contract with [facility] -I will give one time 5 mg Percocet and give Zanaflex that was helpful in the past -lidocaine patch for the back  #Labs -lipid paneland A1C are normal, TSHlow with normal T4  -EKGreviewed, sinus rhythm with QTc 417 -UA nad UDS pending  #Disposition -discharge to home -needs a new psychiatrist locally  Orson Slick, MD 09/03/2018, 6:30 PM

## 2018-09-02 NOTE — Plan of Care (Signed)
  Problem: Education: Goal: Utilization of techniques to improve thought processes will improve Outcome: Progressing   

## 2018-09-02 NOTE — [facility] Group Notes (Signed)
LCSW Group Therapy Note 09/02/2018 1:15pm  Type of Therapy and Topic: Group Therapy: Feelings Around Returning Home & Establishing a Supportive Framework and Supporting Oneself When Supports Not Available  Participation Level: Active  Description of Group:  Patients first processed thoughts and feelings about upcoming discharge. These included fears of upcoming changes, lack of change, new living environments, judgements and expectations from others and overall stigma of mental health issues. The group then discussed the definition of a supportive framework, what that looks and feels like, and how do to discern it from an unhealthy non-supportive network. The group identified different types of supports as well as what to do when your family/friends are less than helpful or unavailable  Therapeutic Goals  1. Patient will identify one healthy supportive network that they can use at discharge. 2. Patient will identify one factor of a supportive framework and how to tell it from an unhealthy network. 3. Patient able to identify one coping skill to use when they do not have positive supports from others. 4. Patient will demonstrate ability to communicate their needs through discussion and/or role plays.  Summary of Patient Progress:  The patient reports "my body hurts." Pt engaged during group session. As patients processed their anxiety about discharge and described healthy supports patient shared she is not ready to be discharged. "She states she hasn't received the help she needs about her medical problems." Patients identified at least one self-care tool they were willing to use after discharge.   Therapeutic Modalities Cognitive Behavioral Therapy Motivational Interviewing   Louann Hopson  CUEBAS-COLON, LCSW 09/02/2018 11:02 AM

## 2018-09-02 NOTE — Progress Notes (Signed)
Patient has been pleasant and cooperative. Continues to complain on pain and request pain medicine. Denies SI, HI, AVH. Presents anxious at times. Patient is more social with peers.  Encouragement and support offered. Safety checks maintained. Pt receptive and remains safe on unit with q 15 min checks.

## 2018-09-03 MED ORDER — HYDROXYZINE HCL 50 MG PO TABS: 50.0000 mg | ORAL_TABLET | Freq: Three times a day (TID) | ORAL | 1 refills | Status: DC | PRN

## 2018-09-03 MED ORDER — CARBAMAZEPINE 200 MG PO TABS: 200.0000 mg | ORAL_TABLET | Freq: Two times a day (BID) | ORAL | 1 refills | Status: DC

## 2018-09-03 MED ORDER — TIZANIDINE HCL 4 MG PO TABS: 4.0000 mg | ORAL_TABLET | Freq: Three times a day (TID) | ORAL | 0 refills | Status: DC

## 2018-09-03 MED ORDER — TEMAZEPAM 15 MG PO CAPS: 15.0000 mg | ORAL_CAPSULE | Freq: Every day | ORAL | 0 refills | Status: DC

## 2018-09-03 MED ORDER — TEMAZEPAM 15 MG PO CAPS
15.0000 mg | ORAL_CAPSULE | Freq: Every day | ORAL | Status: DC
  Administered 2018-09-03: 15 mg via ORAL
  Filled 2018-09-03: qty 1

## 2018-09-03 MED ORDER — RISPERIDONE 3 MG PO TABS: 3.0000 mg | ORAL_TABLET | Freq: Every day | ORAL | 1 refills | Status: DC

## 2018-09-03 NOTE — Plan of Care (Signed)
Patient is pleasant this evening and less dramatic with her demands, seen in the milieu area with visiting friends and socializing adequately .  patient stated that she is anticipating discharge from here to her daughter and grandchild whom she missed so much.  Patient contract for safety of self and others, states her medications are working well for her,  took all her medicines ,no noticeable side effects noted, 15 minute rounding is in progress, no further complains.   Problem: Education: Goal: Utilization of techniques to improve thought processes will improve Outcome: Progressing   Problem: Activity: Goal: Interest or engagement in leisure activities will improve Outcome: Progressing Goal: Imbalance in normal sleep/wake cycle will improve Outcome: Progressing   Problem: Coping: Goal: Coping ability will improve Outcome: Progressing Goal: Will verbalize feelings Outcome: Progressing   Problem: Safety: Goal: Ability to disclose and discuss suicidal ideas will improve Outcome: Progressing Goal: Ability to identify and utilize support systems that promote safety will improve Outcome: Progressing   Problem: Coping: Goal: Ability to identify and develop effective coping behavior will improve Outcome: Progressing Goal: Ability to interact with others will improve Outcome: Progressing Goal: Demonstration of participation in decision-making regarding own care will improve Outcome: Progressing Goal: Ability to use eye contact when communicating with others will improve Outcome: Progressing   Problem: Health Behavior/Discharge Planning: Goal: Identification of resources available to assist in meeting health care needs will improve Outcome: Progressing   Problem: Self-Concept: Goal: Will verbalize positive feelings about self Outcome: Progressing   Problem: Education: Goal: Knowledge of General Education information will improve Description Including pain rating scale,  medication(s)/side effects and non-pharmacologic comfort measures Outcome: Progressing   Problem: Health Behavior/Discharge Planning: Goal: Ability to manage health-related needs will improve Outcome: Progressing   Problem: Clinical Measurements: Goal: Ability to maintain clinical measurements within normal limits will improve Outcome: Progressing Goal: Will remain free from infection Outcome: Progressing Goal: Diagnostic test results will improve Outcome: Progressing Goal: Respiratory complications will improve Outcome: Progressing Goal: Cardiovascular complication will be avoided Outcome: Progressing   Problem: Activity: Goal: Risk for activity intolerance will decrease Outcome: Progressing   Problem: Nutrition: Goal: Adequate nutrition will be maintained Outcome: Progressing   Problem: Coping: Goal: Level of anxiety will decrease Outcome: Progressing   Problem: Elimination: Goal: Will not experience complications related to bowel motility Outcome: Progressing Goal: Will not experience complications related to urinary retention Outcome: Progressing   Problem: Pain Managment: Goal: General experience of comfort will improve Outcome: Progressing   Problem: Safety: Goal: Ability to remain free from injury will improve Outcome: Progressing   Problem: Skin Integrity: Goal: Risk for impaired skin integrity will decrease Outcome: Progressing

## 2018-09-03 NOTE — Discharge Summary (Signed)
Physician Discharge Summary Note  Patient:  Leslie Wong is an 62 y.o., female MRN:  [id number] DOB:  31-Jul-1956 Patient phone:  [phone] (home)  Patient address:   [place] [id number],  Total Time spent with patient: 20 minutes plus 20 min on care coordination and documentation  Date of Admission:  08/28/2018 Date of Discharge: 09/04/2018  Reason for Admission:  Psychotic break.  History of Present Illness:   Identifying data. Leslie Wong is a 62 year old female with a history of bipolar disorder.   Chief complaint. "Give me a little time."  History of present illness. Information was obtained from the patient and the chart. The patient was brought to the ER at the urging of her PCP and neighbors who noticed change in her behavior for the past week or so. She was not sleepin, talking "out of her head" and agitated. She got very upset at the police and threw her dentures in the house and nor is unable tro tolerate normal diet.   The patient herself denies any problems, does not know how she ended up in the hospital and is very peasant to discuss all her physical problems. She get furious if the subject of mental illness comes up. "It was proven by psychiatrist at [facility] that I do not have bipolr". She denies any symptoms of depression, anxiety, psychosis or substance use. Refuses to take "bipolar medications".  Past psychiatric history. Long history of bipolar. Apparently unable to tolerate many medications including Seroquel. She is very adamant that Cymbalta prescribed in February 2019 made her very sick and she is still recovering from it. Denies suicide attempts. According to the chart, she was hospitalized before.  Family psychiatric history. Unknown.  Social history. Lives independently with a dog. Her daughter has been involved in her care. She is a patient at [facility] where she is prescribed Percocet 5 mg QID with no  relief. Sge has appointment in the fall when she wants to ask to increase the dose.  Principal Problem: Bipolar I disorder, current or most recent episode manic, with psychotic features [facility]) Discharge Diagnoses: Patient Active Problem List   Diagnosis Date Noted  . Bipolar I disorder, current or most recent episode manic, with psychotic features ([place]) [F31.2] 08/28/2018    Priority: High  . Itch of skin [L29.9] 08/15/2018  . Oral ulcer [K12.1] 08/15/2018  . Night sweats [R61] 05/13/2018  . Pupil asymmetry [H57.02] 05/13/2018  . Hx of cold sores [Z86.19] 03/14/2018  . Parkinson disease ([place]) [G20]   . Health care maintenance [Z00.00] 06/23/2017  . HLD (hyperlipidemia) [E78.5] 03/10/2016  . Advance care planning [Z71.89] 03/10/2016  . Generalized abdominal pain [R10.84] 02/18/2015  . Osteopenia [M85.80] 04/18/2014  . Medicare annual wellness visit, initial [Z00.00] 03/18/2014  . Shoulder pain [M25.519] 01/17/2013  . Chronic back pain [M54.9, G89.29] 09/14/2011  . Hypercalcemia [E83.52] 03/25/2010  . NAUSEA WITH VOMITING [R11.2] 05/12/2009  . INCONTINENCE, URGE [N39.41] 04/16/2009  . CARPAL TUNNEL SYNDROME, BILATERAL [G56.00] 09/24/2008  . Tongue irritation [K14.9] 09/24/2008  . URETHRAL STRICTURE [IMO0002] 06/13/2007  . Hypothyroidism [E03.9] 06/05/2007  . DISORDER, BIPOLAR NOS [F31.9] 06/05/2007  . MIGRAINE HEADACHE [G43.909] 06/05/2007  . GASTROPARESIS [K31.84] 06/05/2007  . Gastroesophageal reflux disease with hiatal hernia [K21.9, K44.9] 06/05/2007  . IRRITABLE BOWEL SYNDROME [K58.9] 06/05/2007  . FIBROCYSTIC BREAST DISEASE [N60.19] 06/05/2007    Past Medical History:  Past Medical History:  Diagnosis Date  . Back pain   .  Cancer ([place])    skin  . Depression    BAD  . Gastroparesis   . GERD (gastroesophageal reflux disease)   . Hypothyroidism   . Insomnia   . Migraines   . Parkinson disease ([place])    per [place] [facility], dx'd 2019  . Recurrent cold sores   . Shoulder  bursitis   . Urge incontinence     Past Surgical History:  Procedure Laterality Date  . ABDOMINAL HYSTERECTOMY  1999   total  . APPENDECTOMY    . ESOPHAGOGASTRODUODENOSCOPY  01/2006   negative except small hiatal hernia  . ESOPHAGOGASTRODUODENOSCOPY  02/24/2015   See report  . LAPAROSCOPY     for endometriosis  . OVARIAN CYST REMOVAL  1990   unilateral  . TONSILLECTOMY AND ADENOIDECTOMY     Family History:  Family History  Problem Relation Age of Onset  . Hypertension Mother   . Arthritis Mother   . Diabetes Mother   . Stroke Mother   . Cancer Father        lung  . Colon cancer Neg Hx   . Breast cancer Neg Hx     Social History:  Social History   Substance and Sexual Activity  Alcohol Use No  . Alcohol/week: 0.0 standard drinks     Social History   Substance and Sexual Activity  Drug Use No    Social History   Socioeconomic History  . Marital status: Divorced    Spouse name: Not on file  . Number of children: Not on file  . Years of education: Not on file  . Highest education level: Not on file  Occupational History  . Not on file  Social Needs  . Financial resource strain: Not on file  . Food insecurity:    Worry: Not on file    Inability: Not on file  . Transportation needs:    Medical: Not on file    Non-medical: Not on file  Tobacco Use  . Smoking status: Former Smoker    Packs/day: 1.00    Years: 42.00    Pack years: 42.00    Types: Cigarettes  . Smokeless tobacco: Former Network engineer and Sexual Activity  . Alcohol use: No    Alcohol/week: 0.0 standard drinks  . Drug use: No  . Sexual activity: Never  Lifestyle  . Physical activity:    Days per week: Not on file    Minutes per session: Not on file  . Stress: Not on file  Relationships  . Social connections:    Talks on phone: Not on file    Gets together: Not on file    Attends religious service: Not on file    Active member of club or organization: Not on file    Attends  meetings of clubs or organizations: Not on file    Relationship status: Not on file  Other Topics Concern  . Not on file  Social History Narrative   Lives alone.     Has a dog names [facility] Course:    Leslie Wong is a 62 year old female with a history of bipolar disorder, noncompliant with medications and with no insight, admitted for a manic episode.She was started on medications and tolerated them well with full symptom resulution. At the time of discharge, the patient is no longer agitated,psychotic or bizarre.  #Mood and psychosis, improved -continue Risperdal 3 mg nightly  -continue Tegretol 200 mg BID  #Insomnia, slept better with treatment -  continue Restoril 15 mg nightly PRN  #Hypothyroidism -Synthroid 88 ug daily  #GERD -Pepcid 20 mg daily  #Chronic pain -Percocet 5 mg QID PRN as prescribed by [facility] -continue Zanaflex 2 mg BID   #Labs -lipid paneland A1C are normal, TSHlowwith normalT4  -EKGreviewed, sinus rhythm with QTc 417  #Disposition -discharge to home -follow up with [place] [facility] for Parkinson's, [facility] and [facility] for mental health   Physical Findings: AIMS: Facial and Oral Movements Muscles of Facial Expression: None, normal Lips and Perioral Area: None, normal Jaw: None, normal Tongue: None, normal,Extremity Movements Upper (arms, wrists, hands, fingers): None, normal Lower (legs, knees, ankles, toes): None, normal, Trunk Movements Neck, shoulders, hips: None, normal, Overall Severity Severity of abnormal movements (highest score from questions above): None, normal Incapacitation due to abnormal movements: None, normal Patient's awareness of abnormal movements (rate only patient's report): No Awareness, Dental Status Current problems with teeth and/or dentures?: No Does patient usually wear dentures?: No  CIWA:  CIWA-Ar Total: 2 COWS:  COWS Total Score: 3  Musculoskeletal: Strength & Muscle  Tone: within normal limits Gait & Station: normal Patient leans: N/A  Psychiatric Specialty Exam: Physical Exam  Nursing note and vitals reviewed. Psychiatric: She has a normal mood and affect. Her speech is normal and behavior is normal. Thought content normal. Cognition and memory are normal. She expresses impulsivity.    Review of Systems  Musculoskeletal: Positive for back pain and neck pain.  Neurological: Positive for tremors.  Psychiatric/Behavioral: Negative.   All other systems reviewed and are negative.   Blood pressure 110/78, pulse 97, temperature 98.1 F (36.7 C), temperature source Oral, resp. rate 18, height 4\' 8"  (1.422 m), weight 47.6 kg, SpO2 98 %.Body mass index is 23.54 kg/m.  General Appearance: Casual  Eye Contact:  Good  Speech:  Clear and Coherent  Volume:  Normal  Mood:  Euthymic  Affect:  Appropriate  Thought Process:  Goal Directed and Descriptions of Associations: Intact  Orientation:  Full (Time, Place, and Person)  Thought Content:  WDL  Suicidal Thoughts:  No  Homicidal Thoughts:  No  Memory:  Immediate;   Fair Recent;   Fair Remote;   Fair  Judgement:  Impaired  Insight:  Shallow  Psychomotor Activity:  Normal  Concentration:  Concentration: Fair and Attention Span: Fair  Recall:  [employer] of Knowledge:  Fair  Language:  Fair  Akathisia:  No  Handed:  Right  AIMS (if indicated):     Assets:  Communication Skills Desire for Improvement Financial Resources/Insurance Housing Resilience Social Support  ADL's:  Intact  Cognition:  WNL  Sleep:  Number of Hours: 6.3     Have you used any form of tobacco in the last 30 days? (Cigarettes, Smokeless Tobacco, Cigars, and/or Pipes): No  Has this patient used any form of tobacco in the last 30 days? (Cigarettes, Smokeless Tobacco, Cigars, and/or Pipes) Yes, No  Blood Alcohol level:  Lab Results  Component Value Date   ETH <10 14/43/1540    Metabolic Disorder Labs:  Lab Results   Component Value Date   HGBA1C 5.5 08/29/2018   MPG 111.15 08/29/2018   No results found for: PROLACTIN Lab Results  Component Value Date   CHOL 170 08/29/2018   TRIG 83 08/29/2018   HDL 46 08/29/2018   CHOLHDL 3.7 08/29/2018   VLDL 17 08/29/2018   LDLCALC 107 (H) 08/29/2018   LDLCALC 152 (H) 02/24/2014    See Psychiatric Specialty  Exam and Suicide Risk Assessment completed by Attending Physician prior to discharge.  Discharge destination:  Home  Is patient on multiple antipsychotic therapies at discharge:  No   Has Patient had three or more failed trials of antipsychotic monotherapy by history:  No  Recommended Plan for Multiple Antipsychotic Therapies: NA  Discharge Instructions    Diet - low sodium heart healthy   Complete by:  As directed    Increase activity slowly   Complete by:  As directed      Allergies as of 09/04/2018      Reactions   Cymbalta [duloxetine Hcl]    Valproic Acid Shortness Of Breath   Aspirin    REACTION: UNSPECIFIED   Baclofen    REACTION: Throat swells up   Codeine    REACTION: UNSPECIFIED   Erythromycin    REACTION: UNSPECIFIED   Gabapentin    REACTION: throat swells up   Metoclopramide Hcl    REACTION: states "messed me up"   Nsaids Other (See Comments)   Stomach problems   Nucynta Er [tapentadol Hcl Er]    Intolerant, see note from 07/24/12.     Nucynta [tapentadol]    AMS   Prednisone    GI intolance   Pregabalin    Seroquel [quetiapine Fumerate] Other (See Comments)   Loss control, felt like on another planet   Sulfa Antibiotics Nausea And Vomiting   Topamax Other (See Comments)   Blisters in mouth and tongue   Vicodin [hydrocodone-acetaminophen]    Nausea, thrush and constipation      Medication List    STOP taking these medications   CHANTIX 1 MG tablet Generic drug:  varenicline   triamcinolone cream 0.1 % Commonly known as:  KENALOG   valACYclovir 500 MG tablet Commonly known as:  VALTREX     TAKE these  medications     Indication  albuterol 108 (90 Base) MCG/ACT inhaler Commonly known as:  PROVENTIL HFA;VENTOLIN HFA Inhale 2 puffs into the lungs every 6 (six) hours as needed for wheezing or shortness of breath.  Indication:  Chronic Obstructive Lung Disease   ascorbic acid 500 MG tablet Commonly known as:  VITAMIN C Take 500 mg by mouth daily.  Indication:  Inadequate Vitamin C   carbamazepine 200 MG tablet Commonly known as:  TEGRETOL Take 1 tablet (200 mg total) by mouth every 12 (twelve) hours.  Indication:  Manic-Depression   cholecalciferol 1000 units tablet Commonly known as:  VITAMIN D Take 1 tablet (1,000 Units total) by mouth daily.  Indication:  osteopenia   Fish Oil 1000 MG Caps Take 1 capsule (1,000 mg total) by mouth daily.  Indication:  bipolar disorder   GELNIQUE 10 % Gel Generic drug:  Oxybutynin Chloride Place onto the skin daily.  Indication:  Urinary Incontinence   HAIR SKIN & NAILS GUMMIES 1250-7.5-7.5 MCG-MG-UNT Chew Generic drug:  Biotin w/ Vitamins C & E Chew by mouth daily.  Indication:  general health   hydrOXYzine 50 MG tablet Commonly known as:  ATARAX/VISTARIL Take 1 tablet (50 mg total) by mouth 3 (three) times daily as needed for anxiety.  Indication:  Feeling Anxious   levothyroxine 88 MCG tablet Commonly known as:  SYNTHROID, LEVOTHROID TAKE 1 TABLET(88 MCG) BY MOUTH DAILY  Indication:  Underactive Thyroid   magic mouthwash Soln Take 5 mLs by mouth 3 (three) times daily as needed for mouth pain.  Indication:  dry mouth   multivitamin tablet Take 1 tablet by mouth daily.  Indication:  general health   omeprazole 20 MG capsule Commonly known as:  PRILOSEC TAKE 1 CAPSULE BY MOUTH EVERY MORNING 45 MINUTES BEFORE BREAKFAST  Indication:  Gastroesophageal Reflux Disease   ondansetron 4 MG disintegrating tablet Commonly known as:  ZOFRAN-ODT DISSOLVE 1 TABLET BY MOUTH AS NEEDED  Indication:  Nausea and Vomiting   OVER THE  COUNTER MEDICATION [place] daily for menopausal symptoms.  Indication:  menopause   oxyCODONE 5 MG immediate release tablet Commonly known as:  Oxy IR/ROXICODONE Take 5 mg by mouth 4 (four) times daily.  Indication:  Chronic Pain   risperiDONE 3 MG tablet Commonly known as:  RISPERDAL Take 1 tablet (3 mg total) by mouth at bedtime.  Indication:  Manic-Depression   temazepam 15 MG capsule Commonly known as:  RESTORIL Take 1 capsule (15 mg total) by mouth at bedtime.  Indication:  Trouble Sleeping   tiZANidine 4 MG tablet Commonly known as:  ZANAFLEX Take 1 tablet (4 mg total) by mouth 3 (three) times daily.  Indication:  Muscle Spasticity   vitamin A 10000 UNIT capsule Generic drug:  vitamin A Take 10,000 Units by mouth daily.  Indication:  Vitamin A Deficiency   vitamin B-12 1000 MCG tablet Commonly known as:  CYANOCOBALAMIN Take 1,000 mcg by mouth daily.  Indication:  Inadequate Vitamin B12   vitamin E 200 UNIT capsule Take 200 Units by mouth daily.  Indication:  Deficiency of Vitamin E        Follow-up recommendations:  Activity:  as tolerated Diet:  low sodium heart healthy Other:  keep follow up appointments  Comments:     Signed: Orson Slick, MD 09/04/2018, 8:58 AM

## 2018-09-03 NOTE — [facility] Suicide Risk Assessment (Signed)
[facility] Discharge Suicide Risk Assessment   Principal Problem: Bipolar I disorder, current or most recent episode manic, with psychotic features [facility]) Discharge Diagnoses:  Patient Active Problem List   Diagnosis Date Noted  . Bipolar I disorder, current or most recent episode manic, with psychotic features ([place]) [F31.2] 08/28/2018    Priority: High  . Itch of skin [L29.9] 08/15/2018  . Oral ulcer [K12.1] 08/15/2018  . Night sweats [R61] 05/13/2018  . Pupil asymmetry [H57.02] 05/13/2018  . Hx of cold sores [Z86.19] 03/14/2018  . Parkinson disease ([place]) [G20]   . Health care maintenance [Z00.00] 06/23/2017  . HLD (hyperlipidemia) [E78.5] 03/10/2016  . Advance care planning [Z71.89] 03/10/2016  . Generalized abdominal pain [R10.84] 02/18/2015  . Osteopenia [M85.80] 04/18/2014  . Medicare annual wellness visit, initial [Z00.00] 03/18/2014  . Shoulder pain [M25.519] 01/17/2013  . Chronic back pain [M54.9, G89.29] 09/14/2011  . Hypercalcemia [E83.52] 03/25/2010  . NAUSEA WITH VOMITING [R11.2] 05/12/2009  . INCONTINENCE, URGE [N39.41] 04/16/2009  . CARPAL TUNNEL SYNDROME, BILATERAL [G56.00] 09/24/2008  . Tongue irritation [K14.9] 09/24/2008  . URETHRAL STRICTURE [IMO0002] 06/13/2007  . Hypothyroidism [E03.9] 06/05/2007  . DISORDER, BIPOLAR NOS [F31.9] 06/05/2007  . MIGRAINE HEADACHE [G43.909] 06/05/2007  . GASTROPARESIS [K31.84] 06/05/2007  . Gastroesophageal reflux disease with hiatal hernia [K21.9, K44.9] 06/05/2007  . IRRITABLE BOWEL SYNDROME [K58.9] 06/05/2007  . FIBROCYSTIC BREAST DISEASE [N60.19] 06/05/2007    Total Time spent with patient: 20 minutes  Musculoskeletal: Strength & Muscle Tone: within normal limits Gait & Station: normal Patient leans: N/A  Psychiatric Specialty Exam: Review of Systems  Musculoskeletal: Positive for back pain and neck pain.  Neurological: Positive for tremors.  Psychiatric/Behavioral: Negative.   All other systems reviewed and are  negative.   Blood pressure 110/78, pulse 97, temperature 98.1 F (36.7 C), temperature source Oral, resp. rate 18, height 4\' 8"  (1.422 m), weight 47.6 kg, SpO2 98 %.Body mass index is 23.54 kg/m.  General Appearance: Casual  Eye Contact::  Good  Speech:  Clear and Coherent409  Volume:  Normal  Mood:  Euthymic  Affect:  Appropriate  Thought Process:  Goal Directed and Descriptions of Associations: Intact  Orientation:  Full (Time, Place, and Person)  Thought Content:  WDL  Suicidal Thoughts:  No  Homicidal Thoughts:  No  Memory:  Immediate;   Fair Recent;   Fair Remote;   Fair  Judgement:  Impaired  Insight:  Shallow  Psychomotor Activity:  Normal  Concentration:  Fair  Recall:  [place]  Language: Fair  Akathisia:  No  Handed:  Right  AIMS (if indicated):     Assets:  Communication Skills Desire for Improvement Financial Resources/Insurance Housing Resilience Social Support  Sleep:  Number of Hours: 6.3  Cognition: WNL  ADL's:  Intact   Mental Status Per Nursing Assessment::   On Admission:  NA  Demographic Factors:  Divorced or widowed, Caucasian and Living alone  Loss Factors: Decline in physical health  Historical Factors: Prior suicide attempts, Family history of mental illness or substance abuse and Impulsivity  Risk Reduction Factors:   Sense of responsibility to family, Living with another person, especially a relative, Positive social support and Positive therapeutic relationship  Continued Clinical Symptoms:  Bipolar Disorder:   Mixed State Chronic Pain  Cognitive Features That Contribute To Risk:  None    Suicide Risk:  Minimal: No identifiable suicidal ideation.  Patients presenting with no risk factors but with morbid ruminations; may be classified as minimal risk  based on the severity of the depressive symptoms    Plan Of Care/Follow-up recommendations:  Activity:  as tolerated Diet:  low sodium heart healthy Other:   keep follow up appointments  Orson Slick, MD 09/04/2018, 8:58 AM

## 2018-09-03 NOTE — Progress Notes (Signed)
Recreation Therapy Notes  Date: 09/03/2018  Time: 9:30 am  Location: Craft Room  Behavioral response: Appropriate    Intervention Topic: Communication  Discussion/Intervention:  Group content today was focused on communication. The group defined communication and ways to communicate with others. Individuals stated reason why communication is important and some reasons to communicate with others. Patients expressed if they thought they were good at communicating with others and ways they could improve their communication skills. The group identified important parts of communication and some experiences they have had in the past with communication. The group participated in the intervention "Put the story together", where they had a chance to test out their communication skills and identify ways to improve their communication techniques.   Clinical Observations/Feedback:  Patient came to group and defined communication as respect and dignity. She identified communicating through sign language because members in her family have hearing issues. Individual participated in the intervention and was social with peers and staff during group.  Tateanna Bach LRT/CTRS         Lanae Federer 09/03/2018 12:30 PM

## 2018-09-03 NOTE — Tx Team (Signed)
Interdisciplinary Treatment and Diagnostic Plan Update  09/03/2018 Time of Session: 10:50 AM Leslie Wong MRN: [id number]  Principal Diagnosis: Bipolar I disorder, current or most recent episode manic, with psychotic features ([place])  Secondary Diagnoses: Principal Problem:   Bipolar I disorder, current or most recent episode manic, with psychotic features ([place]) Active Problems:   Hypothyroidism   Gastroesophageal reflux disease with hiatal hernia   Current Medications:  Current Facility-Administered Medications  Medication Dose Route Frequency Provider Last Rate Last Dose  . acetaminophen (TYLENOL) tablet 650 mg  650 mg Oral Q6H PRN [name], [name], MD      . alum & mag hydroxide-simeth (MAALOX/MYLANTA) 200-200-20 MG/5ML suspension 30 mL  30 mL Oral Q4H PRN [name], [name], MD      . carbamazepine (TEGRETOL) tablet 200 mg  200 mg Oral Q12H [name], [name], MD   200 mg at 09/03/18 0751  . cholecalciferol (VITAMIN D) tablet 1,000 Units  1,000 Units Oral Daily [name], [name], MD   1,000 Units at 09/03/18 0751  . famotidine (PEPCID) tablet 20 mg  20 mg Oral Daily Pucilowska, Jolanta B, MD   20 mg at 09/03/18 0751  . feeding supplement (ENSURE ENLIVE) (ENSURE ENLIVE) liquid 237 mL  237 mL Oral TID BM Pucilowska, Jolanta B, MD   237 mL at 09/02/18 2044  . hydrOXYzine (ATARAX/VISTARIL) tablet 50 mg  50 mg Oral TID PRN [name], [name], MD   50 mg at 09/02/18 1212  . levothyroxine (SYNTHROID, LEVOTHROID) tablet 88 mcg  88 mcg Oral QAC breakfast [name], [name], MD   88 mcg at 09/03/18 [phone]  . magic mouthwash  10 mL Oral TID PRN Pucilowska, Jolanta B, MD   10 mL at 09/03/18 1432   And  . lidocaine (XYLOCAINE) 2 % viscous mouth solution 10 mL  10 mL Mouth/Throat TID PRN Pucilowska, Jolanta B, MD      . magnesium hydroxide (MILK OF MAGNESIA) suspension 30 mL  30 mL Oral Daily PRN [name], [name], MD      . ondansetron (ZOFRAN-ODT) disintegrating tablet 8 mg  8 mg Oral Q8H PRN Pucilowska,  Jolanta B, MD   8 mg at 09/01/18 0001  . oxyCODONE-acetaminophen (PERCOCET/ROXICET) 5-325 MG per tablet 1 tablet  1 tablet Oral Q6H PRN [name], [name], MD   1 tablet at 09/03/18 1432  . risperiDONE (RISPERDAL) tablet 3 mg  3 mg Oral QHS [name], [name], MD   3 mg at 09/02/18 2151  . temazepam (RESTORIL) capsule 15 mg  15 mg Oral QHS Pucilowska, Jolanta B, MD      . tiZANidine (ZANAFLEX) tablet 4 mg  4 mg Oral TID Pucilowska, Jolanta B, MD   4 mg at 09/03/18 1221   PTA Medications: Medications Prior to Admission  Medication Sig Dispense Refill Last Dose  . albuterol (PROVENTIL HFA;VENTOLIN HFA) 108 (90 Base) MCG/ACT inhaler Inhale 2 puffs into the lungs every 6 (six) hours as needed for wheezing or shortness of breath. 1 Inhaler 0 PRN at PRN  . ascorbic acid (VITAMIN C) 500 MG tablet Take 500 mg by mouth daily.   Taking  . Biotin w/ Vitamins C & E (HAIR SKIN & NAILS GUMMIES) 1250-7.5-7.5 MCG-MG-UNT CHEW Chew by mouth daily.   Taking  . CHANTIX 1 MG tablet TAKE 1 TABLET BY MOUTH TWICE DAILY 60 tablet 0   . cholecalciferol (VITAMIN D) 1000 units tablet Take 1 tablet (1,000 Units total) by mouth daily.   Taking  .  levothyroxine (SYNTHROID, LEVOTHROID) 88 MCG tablet TAKE 1 TABLET(88 MCG) BY MOUTH DAILY 90 tablet 3 Taking  . magic mouthwash SOLN Take 5 mLs by mouth 3 (three) times daily as needed for mouth pain. 200 mL 0 PRN at PRN  . Multiple Vitamin (MULTIVITAMIN) tablet Take 1 tablet by mouth daily.     Taking  . Omega-3 Fatty Acids (FISH OIL) 1000 MG CAPS Take 1 capsule (1,000 mg total) by mouth daily.   Taking  . omeprazole (PRILOSEC) 20 MG capsule TAKE 1 CAPSULE BY MOUTH EVERY MORNING 45 MINUTES BEFORE BREAKFAST 90 capsule 0 Taking  . ondansetron (ZOFRAN-ODT) 4 MG disintegrating tablet DISSOLVE 1 TABLET BY MOUTH AS NEEDED 20 tablet 1 PRN at PRN  . OVER THE COUNTER MEDICATION [place] daily for menopausal symptoms.   Taking  . Oxybutynin Chloride (GELNIQUE) 10 % GEL Place onto the skin  daily.     Taking  . oxyCODONE (OXY IR/ROXICODONE) 5 MG immediate release tablet Take 5 mg by mouth 4 (four) times daily.    Taking  . triamcinolone cream (KENALOG) 0.1 % Apply 1 application topically 2 (two) times daily. (Patient not taking: Reported on 08/28/2018) 30 g 0 Not Taking at Unknown time  . valACYclovir (VALTREX) 500 MG tablet TAKE 1 TABLET(500 MG) BY MOUTH TWICE DAILY 60 tablet 2   . vitamin A (VITAMIN A) 10000 UNIT capsule Take 10,000 Units by mouth daily.   Taking  . vitamin B-12 (CYANOCOBALAMIN) 1000 MCG tablet Take 1,000 mcg by mouth daily.   Taking  . vitamin E 200 UNIT capsule Take 200 Units by mouth daily.     Taking    Patient Stressors: Financial difficulties Medication change or noncompliance Occupational concerns  Patient Strengths: Active sense of humor Average or above average intelligence Capable of independent living Motivation for treatment/growth Supportive family/friends  Treatment Modalities: Medication Management, Group therapy, Case management,  1 to 1 session with clinician, Psychoeducation, Recreational therapy.   Physician Treatment Plan for Primary Diagnosis: Bipolar I disorder, current or most recent episode manic, with psychotic features ([place]) Long Term Goal(s): Improvement in symptoms so as ready for discharge Improvement in symptoms so as ready for discharge   Short Term Goals: Ability to identify changes in lifestyle to reduce recurrence of condition will improve Ability to verbalize feelings will improve Ability to disclose and discuss suicidal ideas Ability to demonstrate self-control will improve Ability to identify and develop effective coping behaviors will improve Ability to maintain clinical measurements within normal limits will improve Compliance with prescribed medications will improve Ability to identify triggers associated with substance abuse/mental health issues will improve NA  Medication Management: Evaluate patient's  response, side effects, and tolerance of medication regimen.  Therapeutic Interventions: 1 to 1 sessions, Unit Group sessions and Medication administration.  Evaluation of Outcomes: Progressing Leslie Wong is a 62 year old female with a history of bipolar disorder, noncompliant with medications and no insight, admitted for a manic episode.She now accepts medications.  #Mood and psychosis, improving -continue Risperdal 3 mg nightly  -continue Tegretol 200 mg BID  #Insomnia, slept better with treatment -lower Restoril to 15 mg nightly   #Hypothyroidism -Synthroid 88 ug daily  9/9:   Physician Treatment Plan for Secondary Diagnosis: Principal Problem:   Bipolar I disorder, current or most recent episode manic, with psychotic features ([place]) Active Problems:   Hypothyroidism   Gastroesophageal reflux disease with hiatal hernia  Long Term Goal(s): Improvement in symptoms so as ready for discharge Improvement in symptoms so  as ready for discharge   Short Term Goals: Ability to identify changes in lifestyle to reduce recurrence of condition will improve Ability to verbalize feelings will improve Ability to disclose and discuss suicidal ideas Ability to demonstrate self-control will improve Ability to identify and develop effective coping behaviors will improve Ability to maintain clinical measurements within normal limits will improve Compliance with prescribed medications will improve Ability to identify triggers associated with substance abuse/mental health issues will improve NA     Medication Management: Evaluate patient's response, side effects, and tolerance of medication regimen.  Therapeutic Interventions: 1 to 1 sessions, Unit Group sessions and Medication administration.  Evaluation of Outcomes: Progressing   RN Treatment Plan for Primary Diagnosis: Bipolar I disorder, current or most recent episode manic, with psychotic features ([place]) Long Term Goal(s): Knowledge  of disease and therapeutic regimen to maintain health will improve  Short Term Goals: Ability to remain free from injury will improve, Ability to verbalize frustration and anger appropriately will improve, Ability to identify and develop effective coping behaviors will improve and Compliance with prescribed medications will improve  Medication Management: RN will administer medications as ordered by provider, will assess and evaluate patient's response and provide education to patient for prescribed medication. RN will report any adverse and/or side effects to prescribing provider.  Therapeutic Interventions: 1 on 1 counseling sessions, Psychoeducation, Medication administration, Evaluate responses to treatment, Monitor vital signs and CBGs as ordered, Perform/monitor CIWA, COWS, AIMS and Fall Risk screenings as ordered, Perform wound care treatments as ordered.  Evaluation of Outcomes: Progressing   LCSW Treatment Plan for Primary Diagnosis: Bipolar I disorder, current or most recent episode manic, with psychotic features ([place]) Long Term Goal(s): Safe transition to appropriate next level of care at discharge, Engage patient in therapeutic group addressing interpersonal concerns.  Short Term Goals: Engage patient in aftercare planning with referrals and resources and Increase skills for wellness and recovery  Therapeutic Interventions: Assess for all discharge needs, 1 to 1 time with Social worker, Explore available resources and support systems, Assess for adequacy in community support network, Educate family and significant other(s) on suicide prevention, Complete Psychosocial Assessment, Interpersonal group therapy.  Evaluation of Outcomes: Progressing   Progress in Treatment: Attending groups: No. Participating in groups: No. Taking medication as prescribed: Yes. Toleration medication: Yes. Family/Significant other contact made: No, will contact:  CSW will contact identified support  person Patient understands diagnosis: No. Discussing patient identified problems/goals with staff: Yes. Medical problems stabilized or resolved: Yes. Denies suicidal/homicidal ideation: Yes. Issues/concerns per patient self-inventory: No. Other: n/a  New problem(s) identified: No, Describe:  No new problems identified  New Short Term/Long Term Goal(s):  Patient Goals:  "I don't really know why I am here.  I do not have Bipolar"  Discharge Plan or Barriers: Tentative plan is for pt to return to her home and resume psychiatric outpatient services at [facility].  Reason for Continuation of Hospitalization: Mania Medication stabilization  Estimated Length of Stay: 9/13  Recreational Therapy: Patient Stressors: N/A Patient Goal: Patient will engage in interactions with peers and staff in pro-social manner at least 2x within 5 recreation therapy group sessions  Attendees: Patient:  09/03/2018 4:46 PM  Physician: Orson Slick, MD 09/03/2018 4:46 PM  Nursing: Canary Brim, RN 09/03/2018 4:46 PM  RN Care Manager: 09/03/2018 4:46 PM  Social Worker: Valora Piccolo Carolynn Sayers [place] LCSW 09/03/2018 4:46 PM  Recreational Therapist: Roanna Epley, LRT 09/03/2018 4:46 PM  Other: Marney Doctor, [place] 09/03/2018 4:46 PM  Other: Sherril Cong 09/03/2018 4:46 PM  Other: 09/03/2018 4:46 PM    Scribe for Treatment Team: Trish Mage, LCSW 09/03/2018 4:46 PM

## 2018-09-03 NOTE — Progress Notes (Signed)
Received Leslie Wong this AM before breakfast, she received her medications. She endorsed feeling a little anxious. She is OOB in the milieu most of the day and socializing with select peers. No complaints of pain today.

## 2018-09-03 NOTE — Plan of Care (Signed)
  Problem: Activity: Goal: Interest or engagement in leisure activities will improve Outcome: Progressing  Patient is enjoying and participating in activities in the courtyard.

## 2018-09-03 NOTE — Progress Notes (Signed)
D:Patient is alert and oriented x 4, she denies SI/HI/AVH,affect is brighter, speech is less pressured non tangential, she appears less anxious, thoughts are organized and she is  interacting appropriately with peers and staff. A:Patientwas offered support and encouragement, and wasgiven scheduled medications, andencouraged to attend groups. 15 minute checks were done for safety R:Pt attends groups and interactsappropriatelywith peers and staff. Patientis taking medication, and hasno complaints.15 minutes safety checks maintained will continue to monitor closely.

## 2018-09-04 NOTE — Progress Notes (Signed)
  [facility] Adult Case Management Discharge Plan :  Will you be returning to the same living situation after discharge:  Yes,    At discharge, do you have transportation home?: Yes,    Do you have the ability to pay for your medications: Yes,     Release of information consent forms completed and in the chart;  Patient's signature needed at discharge.  Patient to Follow up at: Follow-up Information    [facility] on 09/25/2018.   Why:  Hospital Follow up, Earliest available appointment with Pt's chosen provider is 09/25/2018 at 11:20am Contact information: [place] [address] [phone]           Next level of care provider has access to [place] and Suicide Prevention discussed: Yes,     Have you used any form of tobacco in the last 30 days? (Cigarettes, Smokeless Tobacco, Cigars, and/or Pipes): No  Has patient been referred to the Quitline?: N/A patient is not a smoker  Patient has been referred for addiction treatment: Yes  August Saucer, LCSW 09/04/2018, 2:20 PM

## 2018-09-04 NOTE — [facility] Suicide Risk Assessment (Signed)
[place] INPATIENT:  Family/Significant Other Suicide Prevention Education  Suicide Prevention Education:  Education Completed; in person, with Daughter Rennis Golden,  (name of family member/significant other) has been identified by the patient as the family member/significant other with whom the patient will be residing, and identified as the person(s) who will aid the patient in the event of a mental health crisis (suicidal ideations/suicide attempt).  With written consent from the patient, the family member/significant other has been provided the following suicide prevention education, prior to the and/or following the discharge of the patient.  The suicide prevention education provided includes the following:  Suicide risk factors  Suicide prevention and interventions  National Suicide Hotline telephone number  [facility] assessment telephone number  [facility] Emergency Assistance [place] and/or Residential Mobile Crisis Unit telephone number  Request made of family/significant other to:  Remove weapons (e.g., guns, rifles, knives), all items previously/currently identified as safety concern.    Remove drugs/medications (over-the-counter, prescriptions, illicit drugs), all items previously/currently identified as a safety concern.  The family member/significant other verbalizes understanding of the suicide prevention education information provided.  The family member/significant other agrees to remove the items of safety concern listed above.  Carloyn Jaeger Ayen Viviano, LCSW 09/04/2018, 2:19 PM

## 2018-09-04 NOTE — Progress Notes (Signed)
Recreation Therapy Notes  INPATIENT RECREATION TR PLAN  Patient Details Name: Leslie Wong MRN: [id number] DOB: [birth year] Today's Date: 09/04/2018  Rec Therapy Plan Is patient appropriate for Therapeutic Recreation?: Yes Treatment times per week: at least 3 Estimated Length of Stay: 5-7 days TR Treatment/Interventions: Group participation (Comment)  Discharge Criteria Pt will be discharged from therapy if:: Discharged Treatment plan/goals/alternatives discussed and agreed upon by:: Patient/family  Discharge Summary Short term goals set: Patient will engage in interactions with peers and staff in pro-social manner at least 2x within 5 recreation therapy group sessions Short term goals met: Complete Progress toward goals comments: Groups attended Which groups?: Other (Comment), Coping skills, Communication(Problem Solving, Happiness) Reason goals not met: N/A Therapeutic equipment acquired: N/A Reason patient discharged from therapy: Discharge from hospital Pt/family agrees with progress & goals achieved: Yes Date patient discharged from therapy: 09/04/18   Keelia Graybill 09/04/2018, 12:17 PM

## 2018-09-04 NOTE — Progress Notes (Signed)
Recreation Therapy Notes   Date: 09/04/2018  Time: 9:30 am  Location: Craft Room  Behavioral response: Appropriate    Intervention Topic: Problem Solving  Discussion/Intervention:  Group content on today was focused on problem solving. The group described what problem solving is. Patients expressed how problems affect them and how they deal with problems. Individuals identified healthy ways to deal with problems. Patients explained what normally happens to them when they do not deal with problems. The group expressed reoccurring problems for them. The group participated in the intervention "Ways to Solve problems" where patients were given a chance to explore different ways to solve problems.   Clinical Observations/Feedback:  Patient came to group and identified sitting down sorting problems out as a positive way to deal with problems.Individual was social with peers and staff while participating in the intervention.  Versie Fleener LRT/CTRS          Lindsy Cerullo 09/04/2018 11:11 AM

## 2018-09-04 NOTE — Progress Notes (Signed)
D: Patient is aware of  Discharge this shift .Patient denies suicidal /homicidal ideations. Patient received all belongings brought in  A: No Storage medications. Writer reviewed Discharge Summary, Suicide Risk Assessment, and Transitional Record. Patient also received Prescriptions   from  MD. . Aware  Of follow up appointment . R: Patient left unit with no questions  Or concerns  With  daugther

## 2018-09-06 ENCOUNTER — Other Ambulatory Visit: Payer: Self-pay | Admitting: *Deleted

## 2018-09-06 MED ORDER — MAGIC MOUTHWASH: 5.0000 mL | Freq: Three times a day (TID) | ORAL | 0 refills | Status: DC | PRN

## 2018-09-06 NOTE — Telephone Encounter (Signed)
Faxed refill request. Magic Mouthwash Last office visit:   08/13/18 Last Filled:    200 mL 0 08/10/2018  Please advise.

## 2018-09-06 NOTE — Telephone Encounter (Signed)
Sent. Thanks.  Does she has behavioral health f/u scheduled?  Recent admission.

## 2018-09-07 NOTE — Telephone Encounter (Signed)
Daughter says patient has an appointment with [facility] in [place] on October 1st.  Patient is staying with daughter right now.  Daughter says she will talk with [facility] about the patient's medications because something is causing the patient to be "loopy".

## 2018-09-09 NOTE — Telephone Encounter (Signed)
Noted. Thanks.

## 2018-09-11 DIAGNOSIS — Z720 Tobacco use: Secondary | ICD-10-CM | POA: Diagnosis not present

## 2018-09-11 DIAGNOSIS — G894 Chronic pain syndrome: Secondary | ICD-10-CM | POA: Diagnosis not present

## 2018-09-11 DIAGNOSIS — Z5181 Encounter for therapeutic drug level monitoring: Secondary | ICD-10-CM | POA: Diagnosis not present

## 2018-09-11 DIAGNOSIS — Z87891 Personal history of nicotine dependence: Secondary | ICD-10-CM | POA: Diagnosis not present

## 2018-09-11 DIAGNOSIS — M5136 Other intervertebral disc degeneration, lumbar region: Secondary | ICD-10-CM | POA: Diagnosis not present

## 2018-09-11 DIAGNOSIS — Z716 Tobacco abuse counseling: Secondary | ICD-10-CM | POA: Diagnosis not present

## 2018-09-11 DIAGNOSIS — M545 Low back pain: Secondary | ICD-10-CM | POA: Diagnosis not present

## 2018-09-11 DIAGNOSIS — M25561 Pain in right knee: Secondary | ICD-10-CM | POA: Diagnosis not present

## 2018-09-11 DIAGNOSIS — F17200 Nicotine dependence, unspecified, uncomplicated: Secondary | ICD-10-CM | POA: Diagnosis not present

## 2018-09-11 DIAGNOSIS — Z79891 Long term (current) use of opiate analgesic: Secondary | ICD-10-CM | POA: Diagnosis not present

## 2018-09-11 DIAGNOSIS — M1288 Other specific arthropathies, not elsewhere classified, other specified site: Secondary | ICD-10-CM | POA: Diagnosis not present

## 2018-09-13 ENCOUNTER — Ambulatory Visit (INDEPENDENT_AMBULATORY_CARE_PROVIDER_SITE_OTHER): Payer: Medicare Other | Admitting: Family Medicine

## 2018-09-13 ENCOUNTER — Encounter: Payer: Self-pay | Admitting: Family Medicine

## 2018-09-13 VITALS — BP 106/60 | HR 79 | Temp 98.5°F

## 2018-09-13 DIAGNOSIS — Z23 Encounter for immunization: Secondary | ICD-10-CM

## 2018-09-13 DIAGNOSIS — F312 Bipolar disorder, current episode manic severe with psychotic features: Secondary | ICD-10-CM

## 2018-09-13 DIAGNOSIS — R52 Pain, unspecified: Secondary | ICD-10-CM

## 2018-09-13 DIAGNOSIS — M791 Myalgia, unspecified site: Secondary | ICD-10-CM | POA: Diagnosis not present

## 2018-09-13 LAB — CBC WITH DIFFERENTIAL/PLATELET
BASOS PCT: 0.5 % (ref 0.0–3.0)
Basophils Absolute: 0 10*3/uL (ref 0.0–0.1)
Eosinophils Absolute: 0.4 10*3/uL (ref 0.0–0.7)
Eosinophils Relative: 3.9 % (ref 0.0–5.0)
HEMATOCRIT: 39.1 % (ref 36.0–46.0)
Hemoglobin: 13 g/dL (ref 12.0–15.0)
LYMPHS PCT: 22.8 % (ref 12.0–46.0)
Lymphs Abs: 2.1 10*3/uL (ref 0.7–4.0)
MCHC: 33.2 g/dL (ref 30.0–36.0)
MCV: 94.4 fl (ref 78.0–100.0)
Monocytes Absolute: 0.7 10*3/uL (ref 0.1–1.0)
Monocytes Relative: 7.7 % (ref 3.0–12.0)
NEUTROS ABS: 6 10*3/uL (ref 1.4–7.7)
Neutrophils Relative %: 65.1 % (ref 43.0–77.0)
Platelets: 307 10*3/uL (ref 150.0–400.0)
RBC: 4.14 Mil/uL (ref 3.87–5.11)
RDW: 15 % (ref 11.5–15.5)
WBC: 9.3 10*3/uL (ref 4.0–10.5)

## 2018-09-13 LAB — COMPREHENSIVE METABOLIC PANEL
ALT: 24 U/L (ref 0–35)
AST: 23 U/L (ref 0–37)
Albumin: 3.8 g/dL (ref 3.5–5.2)
Alkaline Phosphatase: 101 U/L (ref 39–117)
BUN: 26 mg/dL — ABNORMAL HIGH (ref 6–23)
CHLORIDE: 104 meq/L (ref 96–112)
CO2: 28 meq/L (ref 19–32)
CREATININE: 0.69 mg/dL (ref 0.40–1.20)
Calcium: 10.1 mg/dL (ref 8.4–10.5)
GFR: 92.31 mL/min (ref >60.00)
Glucose, Bld: 112 mg/dL — ABNORMAL HIGH (ref 70–99)
Potassium: 3.8 mEq/L (ref 3.5–5.1)
SODIUM: 138 meq/L (ref 135–145)
Total Bilirubin: 0.2 mg/dL (ref 0.2–1.2)
Total Protein: 6.5 g/dL (ref 6.0–8.3)

## 2018-09-13 LAB — CK: Total CK: 261 U/L — ABNORMAL HIGH (ref 7–177)

## 2018-09-13 MED ORDER — LEVOTHYROXINE SODIUM 88 MCG PO TABS: ORAL_TABLET | ORAL | 1 refills | Status: DC

## 2018-09-13 MED ORDER — RISPERIDONE 3 MG PO TABS: 1.5000 mg | ORAL_TABLET | Freq: Every day | ORAL | Status: DC

## 2018-09-13 NOTE — Progress Notes (Addendum)
Inpatient course discussed with patient.  She was reportedly acting in an atypical fashion and had disorganized thoughts.  She required inpatient assessment.  Multiple med changes were made in the meantime.  Deemed stable for discharge.  She is living back at home now.  Her daughter is helping out, but her daughter has significant stressors, looking after her own children. Thoughts are more coherent but more pain now diffusely.  This is clearly different with more pain than previous baseline.  She has been followed by the pain clinic.  She still on her baseline pain medications.No Si/Hi.  We talked about extra help at home the patient does not want anyone else in her home, meeting home health or otherwise.  She has [facility] psych f/u on 09/25/18.  That will be a new clinic appointment.    She has vomited recently.  Not passing blood.  She has trouble with grip and hand pain.  She has pain walking.  She has diffuse aches that she describes as a burning neuropathic type pain from the head to the toes.  She has a history of mildly decreased TSH.  Likely clinically insignificant and it would not make sense to change her medication dose at this point, especially since her T4 is normal.  Discussed.  She agrees.  PMH and SH reviewed  ROS: Per HPI unless specifically indicated in ROS section   Meds, vitals, and allergies reviewed.   GEN: nad, alert and oriented x 3.  She looks chronically but not acutely ill. HEENT: mucous membranes moist NECK: supple w/o LA CV: rrr. PULM: ctab, no inc wob ABD: soft, +bs EXT: no edema SKIN: no acute rash

## 2018-09-13 NOTE — Patient Instructions (Signed)
Go to the lab on the way out.  We'll contact you with your lab report. Cut the risperdal back to 1/2 tab at night and see if the aches get better.  Keep the follow up appointment with psychiatry.  Take care.  Glad to see you.

## 2018-09-14 ENCOUNTER — Encounter: Payer: Self-pay | Admitting: Family Medicine

## 2018-09-14 NOTE — Assessment & Plan Note (Addendum)
Based on interview today, her thoughts appear organized.  Still okay for outpatient follow-up.  No suicidal or homicidal intent.  Most recent med changes include addition of carbamazepine and Risperdal.  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  Reasonable to recheck routine labs today and cut her Risperdal back to half tablet per day.  New dose would be 1.5 mg at night.  Unclear if some of her pain is related to carbamazepine, this would be less likely.  We did not change that dose at this point.  She needs to keep her psychiatry follow-up that is scheduled for the near future.  She declined extra help at home right now.  She has benign abdominal exam.  Check routine labs given her history of vomiting.  See notes on labs.  I appreciate the help of all of involved. >25 minutes spent in face to face time with patient, >50% spent in counselling or coordination of care.

## 2018-09-19 ENCOUNTER — Telehealth: Payer: Self-pay | Admitting: Family Medicine

## 2018-09-19 NOTE — Telephone Encounter (Signed)
Copied from [place] [phone]. Topic: Quick Communication - See Telephone Encounter >> Sep 19, 2018  1:47 PM Mylinda Latina, NT wrote: CRM for notification. See Telephone encounter for: 09/19/18. Patient called and states she wanted to make Dr. Damita Dunnings aware that she has black mold and mildew in her apartment. She thinks this could be causing her respiratory issues. SHe wanted to make him aware

## 2018-09-20 ENCOUNTER — Emergency Department
Admission: EM | Admit: 2018-09-20 | Discharge: 2018-09-20 | Disposition: A | Discharge status: Home / Self Care | Payer: Medicare Other | Attending: Emergency Medicine | Admitting: Emergency Medicine

## 2018-09-20 ENCOUNTER — Encounter: Payer: Self-pay | Admitting: *Deleted

## 2018-09-20 ENCOUNTER — Emergency Department: Payer: Medicare Other

## 2018-09-20 ENCOUNTER — Other Ambulatory Visit: Payer: Self-pay

## 2018-09-20 DIAGNOSIS — Z87891 Personal history of nicotine dependence: Secondary | ICD-10-CM | POA: Insufficient documentation

## 2018-09-20 DIAGNOSIS — E039 Hypothyroidism, unspecified: Secondary | ICD-10-CM | POA: Insufficient documentation

## 2018-09-20 DIAGNOSIS — Z79899 Other long term (current) drug therapy: Secondary | ICD-10-CM | POA: Insufficient documentation

## 2018-09-20 DIAGNOSIS — R0602 Shortness of breath: Secondary | ICD-10-CM | POA: Insufficient documentation

## 2018-09-20 DIAGNOSIS — R52 Pain, unspecified: Secondary | ICD-10-CM | POA: Diagnosis not present

## 2018-09-20 DIAGNOSIS — R05 Cough: Secondary | ICD-10-CM | POA: Insufficient documentation

## 2018-09-20 DIAGNOSIS — G894 Chronic pain syndrome: Secondary | ICD-10-CM

## 2018-09-20 DIAGNOSIS — G20 Parkinson's disease: Secondary | ICD-10-CM | POA: Insufficient documentation

## 2018-09-20 DIAGNOSIS — Z85828 Personal history of other malignant neoplasm of skin: Secondary | ICD-10-CM | POA: Diagnosis not present

## 2018-09-20 DIAGNOSIS — R059 Cough, unspecified: Secondary | ICD-10-CM

## 2018-09-20 DIAGNOSIS — F419 Anxiety disorder, unspecified: Secondary | ICD-10-CM | POA: Diagnosis not present

## 2018-09-20 DIAGNOSIS — G8929 Other chronic pain: Secondary | ICD-10-CM | POA: Insufficient documentation

## 2018-09-20 LAB — BASIC METABOLIC PANEL
ANION GAP: 7 (ref 5–15)
BUN: 16 mg/dL (ref 6–20)
CHLORIDE: 108 mmol/L (ref 98–111)
CO2: 26 mmol/L (ref 22–32)
Calcium: 10.4 mg/dL — ABNORMAL HIGH (ref 8.9–10.3)
Creatinine, Ser: 0.84 mg/dL (ref 0.44–1.00)
GFR calc non Af Amer: >60 mL/min (ref >60)
Glucose, Bld: 100 mg/dL — ABNORMAL HIGH (ref 70–99)
POTASSIUM: 3.7 mmol/L (ref 3.5–5.1)
Sodium: 141 mmol/L (ref 135–145)

## 2018-09-20 LAB — URINALYSIS, COMPLETE (UACMP) WITH MICROSCOPIC
BACTERIA UA: NONE SEEN
BILIRUBIN URINE: NEGATIVE
Glucose, UA: NEGATIVE mg/dL
Hgb urine dipstick: NEGATIVE
KETONES UR: NEGATIVE mg/dL
LEUKOCYTES UA: NEGATIVE
Nitrite: NEGATIVE
Protein, ur: NEGATIVE mg/dL
Specific Gravity, Urine: 1.014 (ref 1.005–1.030)
pH: 6 (ref 5.0–8.0)

## 2018-09-20 LAB — CBC
HEMATOCRIT: 41.1 % (ref 35.0–47.0)
Hemoglobin: 13.9 g/dL (ref 12.0–16.0)
MCH: 31.8 pg (ref 26.0–34.0)
MCHC: 33.7 g/dL (ref 32.0–36.0)
MCV: 94.2 fL (ref 80.0–100.0)
Platelets: 285 10*3/uL (ref 150–440)
RBC: 4.36 MIL/uL (ref 3.80–5.20)
RDW: 15.2 % — ABNORMAL HIGH (ref 11.5–14.5)
WBC: 10.4 10*3/uL (ref 3.6–11.0)

## 2018-09-20 LAB — TROPONIN I: Troponin I: <0.03 ng/mL (ref <0.03)

## 2018-09-20 MED ORDER — IPRATROPIUM-ALBUTEROL 0.5-2.5 (3) MG/3ML IN SOLN
3.0000 mL | Freq: Once | RESPIRATORY_TRACT | Status: AC
  Administered 2018-09-20: 3 mL via RESPIRATORY_TRACT
  Filled 2018-09-20: qty 3

## 2018-09-20 MED ORDER — OXYCODONE-ACETAMINOPHEN 5-325 MG PO TABS
1.0000 | ORAL_TABLET | Freq: Once | ORAL | Status: AC
  Administered 2018-09-20: 1 via ORAL
  Filled 2018-09-20: qty 1

## 2018-09-20 MED ORDER — OXYCODONE HCL 5 MG PO TABS: 5.0000 mg | ORAL_TABLET | Freq: Three times a day (TID) | ORAL | 0 refills | Status: DC | PRN

## 2018-09-20 NOTE — ED Triage Notes (Addendum)
Pt brought in via ems from home.  Pt states she is living in an apartment for 12 years and has been exposed to black mold and wants to be checked.  Pt has a cough  Hx schizo.  Pt denies SI or HI.  Pt denies etoh or drug use.  Pt alert.

## 2018-09-20 NOTE — Telephone Encounter (Signed)
Please try to get the patient's daughter on the phone given this conversation.  Patient has h/o BAD and if her mood/thoughts are decompensating then she may need eval tonight.  Thanks.

## 2018-09-20 NOTE — Telephone Encounter (Signed)
Daughter advised.  Patient has called the police and they are taking the patient to the hospital for evaluation.  Hospital was calling the daughter as we finished our conversation.

## 2018-09-20 NOTE — ED Notes (Addendum)
Pt reports cough for 2 days. Dry, non productive. Also reports she is out of her pain medication. Followed by [facility] in [place]. Pt denies SI/HI. Pt denies AVH. Pt requesting to have her pain addressed and then states she wants to be admitted to the mental health unit.

## 2018-09-20 NOTE — Telephone Encounter (Signed)
Noted. Thanks. I'll await those notes.

## 2018-09-20 NOTE — ED Notes (Signed)
First Rn note:  Patient comes into the ED via [facility] from home c/o mold in her house that has been there for 12 years and now she needs to seek treatment.  Patient is talking in pressured speech and has h/o schizophrenia.

## 2018-09-20 NOTE — ED Provider Notes (Signed)
[facility] Emergency Department Provider Note  ____________________________________________  Time seen: Approximately 9:40 PM  I have reviewed the triage vital signs and the nursing notes.   HISTORY  Chief Complaint Cough and Psychiatric Evaluation   HPI Leslie Wong is a 62 y.o. female with a history of bipolar disorder, smoking, Parkinson's disease, fibromyalgia, chronic pain who presents for evaluation of shortness of breath and pain.  Patient reports that she ran out of her pain medication and her prescription is not refillable until 3 days from now.  She receives pain medications from the pain clinic, 120 x 5mg  oxycodone pills every beginning of the month.  She is complaining of generalized body pain which is what she has on a daily basis.  She has not taken any narcotic medication today for that.  She also complains of shortness of breath.  She reports that she is been living in an apartment for 12 years and they recently diagnosed mold in 1 of the neighboring apartments.  Since she found out a few days ago she has been coughing and feels very short of breath when she walks.  She is convinced that she has been exposed to the mold.  She reports that she changed the filters in her apartment in they looked black.  She sent her to her brother to be tested for mode.  She continues to smoke.  Has not used her inhaler at home because she saw on the news that they were being recalled. She denies suicidal homicidal ideation, hallucinations.  She denies drug or alcohol use.  She denies chest pain or history of blood clots, leg pain or swelling or hormones.  She reports that her shortness of breath is only present with walking and resolves at rest.  She denies fever or chills.  Past Medical History:  Diagnosis Date  . Back pain   . Cancer ([place])    skin  . Depression    BAD  . Gastroparesis   . GERD (gastroesophageal reflux disease)   . Hypothyroidism   .  Insomnia   . Migraines   . Parkinson disease ([place])    per [facility] [facility], dx'd 2019  . Recurrent cold sores   . Shoulder bursitis   . Urge incontinence     Patient Active Problem List   Diagnosis Date Noted  . Bipolar I disorder, current or most recent episode manic, with psychotic features ([place]) 08/28/2018  . Itch of skin 08/15/2018  . Oral ulcer 08/15/2018  . Night sweats 05/13/2018  . Pupil asymmetry 05/13/2018  . Hx of cold sores 03/14/2018  . Parkinson disease ([place])   . Health care maintenance 06/23/2017  . HLD (hyperlipidemia) 03/10/2016  . Advance care planning 03/10/2016  . Generalized abdominal pain 02/18/2015  . Osteopenia 04/18/2014  . Medicare annual wellness visit, initial 03/18/2014  . Shoulder pain 01/17/2013  . Chronic back pain 09/14/2011  . Hypercalcemia 03/25/2010  . NAUSEA WITH VOMITING 05/12/2009  . INCONTINENCE, URGE 04/16/2009  . CARPAL TUNNEL SYNDROME, BILATERAL 09/24/2008  . Tongue irritation 09/24/2008  . URETHRAL STRICTURE 06/13/2007  . Hypothyroidism 06/05/2007  . DISORDER, BIPOLAR NOS 06/05/2007  . MIGRAINE HEADACHE 06/05/2007  . GASTROPARESIS 06/05/2007  . Gastroesophageal reflux disease with hiatal hernia 06/05/2007  . IRRITABLE BOWEL SYNDROME 06/05/2007  . FIBROCYSTIC BREAST DISEASE 06/05/2007    Past Surgical History:  Procedure Laterality Date  . ABDOMINAL HYSTERECTOMY  1999   total  . APPENDECTOMY    . ESOPHAGOGASTRODUODENOSCOPY  01/2006  negative except small hiatal hernia  . ESOPHAGOGASTRODUODENOSCOPY  02/24/2015   See report  . LAPAROSCOPY     for endometriosis  . OVARIAN CYST REMOVAL  1990   unilateral  . TONSILLECTOMY AND ADENOIDECTOMY      Prior to Admission medications   Medication Sig Start Date End Date Taking? Authorizing Provider  albuterol (PROVENTIL HFA;VENTOLIN HFA) 108 (90 Base) MCG/ACT inhaler Inhale 2 puffs into the lungs every 6 (six) hours as needed for wheezing or shortness of breath. 03/21/18   Darel Hong, MD  ascorbic acid (VITAMIN C) 500 MG tablet Take 500 mg by mouth daily.    [provider]  Biotin w/ Vitamins C & E ([place]) 1250-7.5-7.5 MCG-MG-UNT CHEW Chew by mouth daily.    [provider]  carbamazepine (TEGRETOL) 200 MG tablet Take 1 tablet (200 mg total) by mouth every 12 (twelve) hours. 09/03/18   Pucilowska, Herma Ard B, MD  cholecalciferol (VITAMIN D) 1000 units tablet Take 1 tablet (1,000 Units total) by mouth daily. 06/22/17   Tonia Ghent, MD  hydrOXYzine (ATARAX/VISTARIL) 50 MG tablet Take 1 tablet (50 mg total) by mouth 3 (three) times daily as needed for anxiety. 09/03/18   Pucilowska, Wardell Honour, MD  levothyroxine (SYNTHROID, LEVOTHROID) 88 MCG tablet TAKE 1 TABLET(88 MCG) BY MOUTH DAILY 09/13/18   Tonia Ghent, MD  magic mouthwash SOLN Take 5 mLs by mouth 3 (three) times daily as needed for mouth pain. 09/06/18   Tonia Ghent, MD  Multiple Vitamin (MULTIVITAMIN) tablet Take 1 tablet by mouth daily.      [provider]  Omega-3 Fatty Acids (FISH OIL) 1000 MG CAPS Take 1 capsule (1,000 mg total) by mouth daily. 06/22/17   Tonia Ghent, MD  omeprazole (PRILOSEC) 20 MG capsule TAKE 1 CAPSULE BY MOUTH EVERY MORNING 45 MINUTES BEFORE BREAKFAST 08/01/18   Tonia Ghent, MD  ondansetron (ZOFRAN-ODT) 4 MG disintegrating tablet DISSOLVE 1 TABLET BY MOUTH AS NEEDED 06/05/18   Tonia Ghent, MD  OVER THE COUNTER MEDICATION [place] Fleeting daily for menopausal symptoms.    [provider]  Oxybutynin Chloride (GELNIQUE) 10 % GEL Place onto the skin daily.      [provider]  oxyCODONE (ROXICODONE) 5 MG immediate release tablet Take 1 tablet (5 mg total) by mouth every 8 (eight) hours as needed. 09/20/18 09/20/19  Rudene Re, MD  risperiDONE (RISPERDAL) 3 MG tablet Take 0.5 tablets (1.5 mg total) by mouth at bedtime. 09/13/18   Tonia Ghent, MD  temazepam (RESTORIL) 15 MG capsule Take 1 capsule (15 mg total)  by mouth at bedtime. 09/03/18   Pucilowska, Jolanta B, MD  tiZANidine (ZANAFLEX) 4 MG tablet Take 1 tablet (4 mg total) by mouth 3 (three) times daily. 09/04/18   Pucilowska, Jolanta B, MD  varenicline (CHANTIX) 1 MG tablet Take 1 mg by mouth 2 (two) times daily.    [provider]  vitamin A (VITAMIN A) 10000 UNIT capsule Take 10,000 Units by mouth daily.    [provider]  vitamin B-12 (CYANOCOBALAMIN) 1000 MCG tablet Take 1,000 mcg by mouth daily.    [provider]  vitamin E 200 UNIT capsule Take 200 Units by mouth daily.      [provider]    Allergies Cymbalta [duloxetine hcl]; Valproic acid; Aspirin; Baclofen; Codeine; Erythromycin; Gabapentin; Metoclopramide hcl; Nsaids; Nucynta er [tapentadol hcl er]; Nucynta [tapentadol]; Prednisone; Pregabalin; Seroquel [quetiapine fumerate]; Sulfa antibiotics; Topamax; and  Vicodin [hydrocodone-acetaminophen]  Family History  Problem Relation Age of Onset  . Hypertension Mother   . Arthritis Mother   . Diabetes Mother   . Stroke Mother   . Cancer Father        lung  . Colon cancer Neg Hx   . Breast cancer Neg Hx     Social History Social History   Tobacco Use  . Smoking status: Former Smoker    Packs/day: 1.00    Years: 42.00    Pack years: 42.00    Types: Cigarettes  . Smokeless tobacco: Former Network engineer Use Topics  . Alcohol use: No    Alcohol/week: 0.0 standard drinks  . Drug use: No    Review of Systems  Constitutional: Negative for fever. + generalized body pain Eyes: Negative for visual changes. ENT: Negative for sore throat. Neck: No neck pain  Cardiovascular: Negative for chest pain. Respiratory: + shortness of breath, and cough Gastrointestinal: Negative for abdominal pain, vomiting or diarrhea. Genitourinary: Negative for dysuria. Musculoskeletal: Negative for back pain. Skin: Negative for rash. Neurological: Negative for headaches, weakness or numbness. Psych: No SI  or HI  ____________________________________________   PHYSICAL EXAM:  VITAL SIGNS: ED Triage Vitals  Enc Vitals Group     BP 09/20/18 1825 100/73     Pulse Rate 09/20/18 1825 86     Resp 09/20/18 1825 20     Temp 09/20/18 1825 98.6 F (37 C)     Temp Source 09/20/18 1825 Oral     SpO2 09/20/18 1825 98 %     Weight 09/20/18 1828 105 lb (47.6 kg)     Height 09/20/18 1828 5\' 5"  (1.651 m)     Head Circumference --      Peak Flow --      Pain Score 09/20/18 1828 0     Pain Loc --      Pain Edu? --      Excl. in [place]? --     Constitutional: Alert and oriented, no apparent distress. HEENT:      Head: Normocephalic and atraumatic.         Eyes: Conjunctivae are normal. Sclera is non-icteric.       Mouth/Throat: Mucous membranes are moist.       Neck: Supple with no signs of meningismus. Cardiovascular: Regular rate and rhythm. No murmurs, gallops, or rubs. 2+ symmetrical distal pulses are present in all extremities. No JVD. Respiratory: Normal respiratory effort. Lungs are clear to auscultation bilaterally. No wheezes, crackles, or rhonchi.  Gastrointestinal: Soft, non tender, and non distended with positive bowel sounds. No rebound or guarding. Musculoskeletal: Nontender with normal range of motion in all extremities. No edema, cyanosis, or erythema of extremities. Neurologic: Normal speech and language. Face is symmetric. Moving all extremities. No gross focal neurologic deficits are appreciated. Skin: Skin is warm, dry and intact. No rash noted. Psychiatric: Mood and affect are normal.  ____________________________________________   LABS (all labs ordered are listed, but only abnormal results are displayed)  Labs Reviewed  BASIC METABOLIC PANEL - Abnormal; Notable for the following components:      Result Value   Glucose, Bld 100 (*)    Calcium 10.4 (*)    All other components within normal limits  CBC - Abnormal; Notable for the following components:   RDW 15.2 (*)     All other components within normal limits  URINALYSIS, COMPLETE (UACMP) WITH MICROSCOPIC - Abnormal; Notable for the following components:   Color,  Urine YELLOW (*)    APPearance HAZY (*)    All other components within normal limits  TROPONIN I   ____________________________________________  EKG  ED ECG REPORT I, Rudene Re, the attending physician, personally viewed and interpreted this ECG.  Normal sinus rhythm, rate of 80, normal intervals, normal axis, no ST elevations or depressions.  Unchanged from prior. ____________________________________________  RADIOLOGY  I have personally reviewed the images performed during this visit and I agree with the Radiologist's read.   Interpretation by Radiologist:  Dg Chest 2 View  Result Date: 09/20/2018 CLINICAL DATA:  Cough EXAM: CHEST - 2 VIEW COMPARISON:  March 21, 2018 FINDINGS: The heart size and mediastinal contours are within normal limits. Both lungs are clear. There is scoliosis of spine. IMPRESSION: No active cardiopulmonary disease. Electronically Signed   By: Abelardo Diesel M.D.   On: 09/20/2018 19:09   ____________________________________________   PROCEDURES  Procedure(s) performed: None Procedures Critical Care performed:  None ____________________________________________   INITIAL IMPRESSION / ASSESSMENT AND PLAN / ED COURSE   62 y.o. female with a history of bipolar disorder, smoking, Parkinson's disease, fibromyalgia, chronic pain who presents for evaluation of shortness of breath and chronic pain.  # SOB: patient convinced she is breathing mold, has lived in this apartment for 12 years. She started to feel SOB and coughing after mold was found in a neighboring apartment a few days ago. No fever or CP, normal WOB, good air movement with no wheezing or crackles, CXR negative for PNA. EKG with no evidence of ischemia and troponin negative. No suspicion for PE with no risk factors, no hypoxia, no tachypnea, no  tachycardia. Patient is a smoker. Has not been using her inhaler because she saw on the news that the inhaler was being recalled and she was afraid to use it.  Will give one duoneb here.  # chronic pain: Patient is seen and followed at pain clinic and receives 120 x 5mg  oxycodone pills every month.  Review of [place] controlled substance database shows the patient last refilled her prescriptions on 08/24/2018.  She is unable to get it refilled for another 3 days.  There are no prescriptions filled from any other providers other than the pain clinic for this patient. Since patient has been on it for so long, I will provide her with a small course of oxycodone to last until the 29th to avoid patient for going in withdrawals.  I discussed with her that she needs to better manage her prescriptions to avoid running out of them ahead of time.    _________________________ 10:22 PM on 09/20/2018 -----------------------------------------  Patient reports feeling better after 1 duoneb, encouraged patient to use her inhaler at home as needed.  Discussed return precautions for worsening shortness of breath, chest pain or fever otherwise she will follow-up with her primary care doctor in 2 days.  Patient will be given 5 x 5mg  oxycodone pills.   As part of my medical decision making, I reviewed the following data within the [place] notes reviewed and incorporated, Labs reviewed , EKG interpreted , Old EKG reviewed, Old chart reviewed, Radiograph reviewed , Notes from prior ED visits and [place] Controlled Substance Database    Pertinent labs & imaging results that were available during my care of the patient were reviewed by me and considered in my medical decision making (see chart for details).    ____________________________________________   FINAL CLINICAL IMPRESSION(S) / ED DIAGNOSES  Final diagnoses:  Cough  Chronic pain syndrome  SOB (shortness of breath)       NEW MEDICATIONS STARTED DURING THIS VISIT:  ED Discharge Orders         Ordered    oxyCODONE (ROXICODONE) 5 MG immediate release tablet  Every 8 hours PRN     09/20/18 2238           Note:  This document was prepared using Dragon voice recognition software and may include unintentional dictation errors.    Alfred Levins, [place], MD 09/20/18 2240

## 2018-09-20 NOTE — Telephone Encounter (Signed)
Spoke with patient who was telling me that she wanted the call recorded and she proceeded to give me her address, what she is wearing, where she is sitting, where she received the flashlight that she used to show the apartment manager the mold, exactly what medications she has taken today, how many cigarettes she had smoked and the brand and the ashtray that she had put them out in.  I asked if the apartment complex was going to be taking care of the mold problems and she said they had taken samples but that she was very sick with chills and pain. Patient stated that her daughter was going to get her a breathing machine and that she was going to have to go outside in order to breathe.  I was unable to get any further information.

## 2018-09-20 NOTE — Telephone Encounter (Signed)
Noted.  Reasonable to get cleaned out and then see if her sx improve.  Update [name] as needed.

## 2018-09-20 NOTE — ED Notes (Signed)
Pt's daughter would like to be called if she is not put in as a psych patient.  She will then have her committed.

## 2018-09-20 NOTE — ED Notes (Signed)
Patient would like to be evaluated by a psychiatrist for anxiety and depression.  Patient recently released from behavioral unit and doesn't believe her medications are working appropriately.

## 2018-09-24 ENCOUNTER — Other Ambulatory Visit: Payer: Self-pay | Admitting: Family Medicine

## 2018-09-25 ENCOUNTER — Other Ambulatory Visit: Payer: Self-pay | Admitting: Family Medicine

## 2018-09-25 DIAGNOSIS — Z885 Allergy status to narcotic agent status: Secondary | ICD-10-CM | POA: Diagnosis not present

## 2018-09-25 DIAGNOSIS — Z79899 Other long term (current) drug therapy: Secondary | ICD-10-CM | POA: Diagnosis not present

## 2018-09-25 DIAGNOSIS — F17200 Nicotine dependence, unspecified, uncomplicated: Secondary | ICD-10-CM | POA: Diagnosis not present

## 2018-09-25 DIAGNOSIS — F17210 Nicotine dependence, cigarettes, uncomplicated: Secondary | ICD-10-CM | POA: Diagnosis not present

## 2018-09-25 DIAGNOSIS — F22 Delusional disorders: Secondary | ICD-10-CM | POA: Diagnosis not present

## 2018-09-25 DIAGNOSIS — F329 Major depressive disorder, single episode, unspecified: Secondary | ICD-10-CM | POA: Diagnosis not present

## 2018-09-25 DIAGNOSIS — G4700 Insomnia, unspecified: Secondary | ICD-10-CM | POA: Diagnosis not present

## 2018-09-25 DIAGNOSIS — Z886 Allergy status to analgesic agent status: Secondary | ICD-10-CM | POA: Diagnosis not present

## 2018-09-25 DIAGNOSIS — E039 Hypothyroidism, unspecified: Secondary | ICD-10-CM | POA: Diagnosis not present

## 2018-09-25 DIAGNOSIS — R4789 Other speech disturbances: Secondary | ICD-10-CM | POA: Diagnosis not present

## 2018-09-25 DIAGNOSIS — F319 Bipolar disorder, unspecified: Secondary | ICD-10-CM | POA: Diagnosis not present

## 2018-09-25 DIAGNOSIS — Z882 Allergy status to sulfonamides status: Secondary | ICD-10-CM | POA: Diagnosis not present

## 2018-09-25 DIAGNOSIS — G20 Parkinson's disease: Secondary | ICD-10-CM | POA: Diagnosis not present

## 2018-09-25 DIAGNOSIS — F4310 Post-traumatic stress disorder, unspecified: Secondary | ICD-10-CM | POA: Diagnosis not present

## 2018-09-25 DIAGNOSIS — F29 Unspecified psychosis not due to a substance or known physiological condition: Secondary | ICD-10-CM | POA: Diagnosis not present

## 2018-09-25 DIAGNOSIS — F419 Anxiety disorder, unspecified: Secondary | ICD-10-CM | POA: Diagnosis not present

## 2018-09-25 MED ORDER — TIZANIDINE HCL 4 MG PO TABS: 4.0000 mg | ORAL_TABLET | Freq: Three times a day (TID) | ORAL | 0 refills | Status: DC

## 2018-09-25 MED ORDER — MAGIC MOUTHWASH: 5.0000 mL | Freq: Three times a day (TID) | ORAL | 0 refills | Status: DC | PRN

## 2018-09-25 NOTE — Telephone Encounter (Unsigned)
Copied from [place] [id number]. Topic: Quick Communication - See Telephone Encounter >> Sep 25, 2018  1:14 PM Conception Chancy, NT wrote: CRM for notification. See Telephone encounter for: 09/25/18.  Patient daughter, Marylou Flesher, is calling and states the patient is needing a refill on [place] Magic Mouthwash before 3:30 as she is going to [facility] for a reevaluation today and will need it if she is admitted. She can be reached at [place], [address] [place] [address] Phone: [phone] Fax: [phone]  Also is needing a refill on tiZANidine (ZANAFLEX) 4 MG tablet.  [place] AT [place] [address] Phone: [phone] Fax: [phone]

## 2018-09-25 NOTE — Telephone Encounter (Signed)
Last OV 09/13/2018. Last Rx 9/10 & 09/06/2018

## 2018-09-25 NOTE — Telephone Encounter (Signed)
Sent.  I'll await the notes from [facility].  If she is admitted, they will typically manage her meds. Thanks.

## 2018-09-26 DIAGNOSIS — F22 Delusional disorders: Secondary | ICD-10-CM | POA: Insufficient documentation

## 2018-09-28 DIAGNOSIS — F4310 Post-traumatic stress disorder, unspecified: Secondary | ICD-10-CM | POA: Diagnosis not present

## 2018-09-28 DIAGNOSIS — F250 Schizoaffective disorder, bipolar type: Secondary | ICD-10-CM | POA: Diagnosis not present

## 2018-09-29 DIAGNOSIS — F4310 Post-traumatic stress disorder, unspecified: Secondary | ICD-10-CM | POA: Diagnosis not present

## 2018-09-29 DIAGNOSIS — F250 Schizoaffective disorder, bipolar type: Secondary | ICD-10-CM | POA: Diagnosis not present

## 2018-09-30 DIAGNOSIS — Z8659 Personal history of other mental and behavioral disorders: Secondary | ICD-10-CM | POA: Diagnosis not present

## 2018-09-30 DIAGNOSIS — R1111 Vomiting without nausea: Secondary | ICD-10-CM | POA: Diagnosis not present

## 2018-09-30 DIAGNOSIS — R202 Paresthesia of skin: Secondary | ICD-10-CM | POA: Diagnosis not present

## 2018-09-30 DIAGNOSIS — M50321 Other cervical disc degeneration at C4-C5 level: Secondary | ICD-10-CM | POA: Diagnosis not present

## 2018-09-30 DIAGNOSIS — R52 Pain, unspecified: Secondary | ICD-10-CM | POA: Diagnosis not present

## 2018-09-30 DIAGNOSIS — F419 Anxiety disorder, unspecified: Secondary | ICD-10-CM | POA: Diagnosis not present

## 2018-09-30 DIAGNOSIS — F909 Attention-deficit hyperactivity disorder, unspecified type: Secondary | ICD-10-CM | POA: Diagnosis not present

## 2018-09-30 DIAGNOSIS — M542 Cervicalgia: Secondary | ICD-10-CM | POA: Diagnosis not present

## 2018-09-30 DIAGNOSIS — R109 Unspecified abdominal pain: Secondary | ICD-10-CM | POA: Diagnosis not present

## 2018-09-30 DIAGNOSIS — R05 Cough: Secondary | ICD-10-CM | POA: Diagnosis not present

## 2018-09-30 DIAGNOSIS — M797 Fibromyalgia: Secondary | ICD-10-CM | POA: Diagnosis not present

## 2018-09-30 DIAGNOSIS — G8929 Other chronic pain: Secondary | ICD-10-CM | POA: Diagnosis not present

## 2018-10-01 ENCOUNTER — Telehealth: Payer: Self-pay | Admitting: *Deleted

## 2018-10-01 DIAGNOSIS — F250 Schizoaffective disorder, bipolar type: Secondary | ICD-10-CM | POA: Diagnosis not present

## 2018-10-01 DIAGNOSIS — F4310 Post-traumatic stress disorder, unspecified: Secondary | ICD-10-CM | POA: Diagnosis not present

## 2018-10-01 NOTE — Telephone Encounter (Signed)
Reasonable for patient NOT to drive to the next OV here.  I'd like to see her prior to making longer term decision. We'll go from there.  Thanks.

## 2018-10-01 NOTE — Telephone Encounter (Signed)
Patient advised.

## 2018-10-01 NOTE — Telephone Encounter (Signed)
Copied from [place] [id number]. Topic: General - Other >> Oct 01, 2018 11:41 AM Yvette Rack wrote: Reason for CRM: pt calling to see if or when can she start driving again she has a daughter that has been taking her to appt and that she want to drive herself to appt to see Dr Damita Dunnings please give her a call back at [phone]

## 2018-10-02 DIAGNOSIS — F4310 Post-traumatic stress disorder, unspecified: Secondary | ICD-10-CM | POA: Diagnosis not present

## 2018-10-02 DIAGNOSIS — F250 Schizoaffective disorder, bipolar type: Secondary | ICD-10-CM | POA: Diagnosis not present

## 2018-10-04 ENCOUNTER — Inpatient Hospital Stay: Payer: Self-pay | Admitting: Family Medicine

## 2018-10-05 DIAGNOSIS — F250 Schizoaffective disorder, bipolar type: Secondary | ICD-10-CM | POA: Diagnosis not present

## 2018-10-05 DIAGNOSIS — F4310 Post-traumatic stress disorder, unspecified: Secondary | ICD-10-CM | POA: Diagnosis not present

## 2018-10-07 ENCOUNTER — Other Ambulatory Visit: Payer: Self-pay

## 2018-10-07 ENCOUNTER — Encounter: Payer: Self-pay | Admitting: Emergency Medicine

## 2018-10-07 ENCOUNTER — Emergency Department
Admission: EM | Admit: 2018-10-07 | Discharge: 2018-10-07 | Disposition: A | Discharge status: Home / Self Care | Payer: Medicare Other | Attending: Emergency Medicine | Admitting: Emergency Medicine

## 2018-10-07 DIAGNOSIS — E039 Hypothyroidism, unspecified: Secondary | ICD-10-CM | POA: Diagnosis not present

## 2018-10-07 DIAGNOSIS — M546 Pain in thoracic spine: Secondary | ICD-10-CM | POA: Diagnosis not present

## 2018-10-07 DIAGNOSIS — Z87891 Personal history of nicotine dependence: Secondary | ICD-10-CM | POA: Diagnosis not present

## 2018-10-07 DIAGNOSIS — M542 Cervicalgia: Secondary | ICD-10-CM | POA: Diagnosis not present

## 2018-10-07 DIAGNOSIS — G894 Chronic pain syndrome: Secondary | ICD-10-CM | POA: Insufficient documentation

## 2018-10-07 DIAGNOSIS — G20 Parkinson's disease: Secondary | ICD-10-CM | POA: Diagnosis not present

## 2018-10-07 DIAGNOSIS — Z85828 Personal history of other malignant neoplasm of skin: Secondary | ICD-10-CM | POA: Diagnosis not present

## 2018-10-07 DIAGNOSIS — Z79899 Other long term (current) drug therapy: Secondary | ICD-10-CM | POA: Insufficient documentation

## 2018-10-07 DIAGNOSIS — G8929 Other chronic pain: Secondary | ICD-10-CM | POA: Diagnosis not present

## 2018-10-07 DIAGNOSIS — R200 Anesthesia of skin: Secondary | ICD-10-CM | POA: Diagnosis not present

## 2018-10-07 MED ORDER — SODIUM CHLORIDE 0.9 % IV BOLUS
1000.0000 mL | Freq: Once | INTRAVENOUS | Status: AC
  Administered 2018-10-07: 1000 mL via INTRAVENOUS

## 2018-10-07 MED ORDER — KETAMINE HCL 10 MG/ML IJ SOLN
0.3000 mg/kg | Freq: Once | INTRAMUSCULAR | Status: AC
  Administered 2018-10-07: 14 mg via INTRAVENOUS
  Filled 2018-10-07: qty 1

## 2018-10-07 MED ORDER — HALOPERIDOL LACTATE 5 MG/ML IJ SOLN
5.0000 mg | Freq: Once | INTRAMUSCULAR | Status: AC
  Administered 2018-10-07: 5 mg via INTRAVENOUS
  Filled 2018-10-07: qty 1

## 2018-10-07 MED ORDER — ONDANSETRON HCL 4 MG/2ML IJ SOLN
4.0000 mg | Freq: Once | INTRAMUSCULAR | Status: AC
  Administered 2018-10-07: 4 mg via INTRAVENOUS
  Filled 2018-10-07: qty 2

## 2018-10-07 NOTE — ED Notes (Signed)
Patient was able to reach sister in law, Maudie Mercury, to come and get her and drive her home.  Maudie Mercury is on the way.    Patient remains AAOx3.  Skin warm and dry. nad

## 2018-10-07 NOTE — ED Notes (Addendum)
Attempted to call patient's daughter for a ride home.  No answer.  Voice Mail full.  Attempted to call patient's brother, Alvester Chou @ [phone], no answer.  Voice mail full.  Attempted to call patient's sister in [place] @ [phone].  No answer.  No voice mail.

## 2018-10-07 NOTE — ED Triage Notes (Signed)
EMS pt to Rm 26 from home with report of neck pain that is chronic but  Worse tonight. Pt reports she smoked pot tonight.

## 2018-10-07 NOTE — Discharge Instructions (Signed)
Please follow-up with your primary care physician as well as your pain management specialist as scheduled.  Return to the emergency department for any concerns.  It was a pleasure to take care of you today, and thank you for coming to our emergency department.  If you have any questions or concerns before leaving please ask the nurse to grab me and I'm more than happy to go through your aftercare instructions again.  If you have any concerns once you are home that you are not improving or are in fact getting worse before you can make it to your follow-up appointment, please do not hesitate to call 911 and come back for further evaluation.  Darel Hong, MD

## 2018-10-07 NOTE — ED Provider Notes (Signed)
[facility] Emergency Department Provider Note  ____________________________________________   First MD Initiated Contact with Patient 10/07/18 0327     (approximate)  I have reviewed the triage vital signs and the nursing notes.   HISTORY  Chief Complaint Neck Pain   HPI Leslie Wong is a 62 y.o. female who comes to the emergency department via EMS with acute on chronic neck pain.  She has a long-standing history of chronic pain for which she follows at pain clinic.  Tonight she was up smoking marijuana with her son when she noted an acute exacerbation of her pain not relieved by oxycodone so she called 911.  She says that last time her pain was this severe she was seen in the emergency department and given ketamine for pain and she is specifically requesting a dose of ketamine and to "get some rest".  She has a complex past medical history including fibromyalgia, depression, bipolar disorder, and chronic pain.  Her symptoms are severe and her lower neck bilaterally worse with movement improved with rest nonradiating.  She denies fevers or chills.  She denies new trauma.    Past Medical History:  Diagnosis Date  . Back pain   . Cancer ([place])    skin  . Depression    BAD  . Gastroparesis   . GERD (gastroesophageal reflux disease)   . Hypothyroidism   . Insomnia   . Migraines   . Parkinson disease ([place])    per [place] [facility], dx'd 2019  . Recurrent cold sores   . Shoulder bursitis   . Urge incontinence     Patient Active Problem List   Diagnosis Date Noted  . Bipolar I disorder, current or most recent episode manic, with psychotic features ([place]) 08/28/2018  . Itch of skin 08/15/2018  . Oral ulcer 08/15/2018  . Night sweats 05/13/2018  . Pupil asymmetry 05/13/2018  . Hx of cold sores 03/14/2018  . Parkinson disease ([place])   . Health care maintenance 06/23/2017  . HLD (hyperlipidemia) 03/10/2016  . Advance care planning 03/10/2016  .  Generalized abdominal pain 02/18/2015  . Osteopenia 04/18/2014  . Medicare annual wellness visit, initial 03/18/2014  . Shoulder pain 01/17/2013  . Chronic back pain 09/14/2011  . Hypercalcemia 03/25/2010  . NAUSEA WITH VOMITING 05/12/2009  . INCONTINENCE, URGE 04/16/2009  . CARPAL TUNNEL SYNDROME, BILATERAL 09/24/2008  . Tongue irritation 09/24/2008  . URETHRAL STRICTURE 06/13/2007  . Hypothyroidism 06/05/2007  . DISORDER, BIPOLAR NOS 06/05/2007  . MIGRAINE HEADACHE 06/05/2007  . GASTROPARESIS 06/05/2007  . Gastroesophageal reflux disease with hiatal hernia 06/05/2007  . IRRITABLE BOWEL SYNDROME 06/05/2007  . FIBROCYSTIC BREAST DISEASE 06/05/2007    Past Surgical History:  Procedure Laterality Date  . ABDOMINAL HYSTERECTOMY  1999   total  . APPENDECTOMY    . ESOPHAGOGASTRODUODENOSCOPY  01/2006   negative except small hiatal hernia  . ESOPHAGOGASTRODUODENOSCOPY  02/24/2015   See report  . LAPAROSCOPY     for endometriosis  . OVARIAN CYST REMOVAL  1990   unilateral  . TONSILLECTOMY AND ADENOIDECTOMY      Prior to Admission medications   Medication Sig Start Date End Date Taking? Authorizing Provider  albuterol (PROVENTIL HFA;VENTOLIN HFA) 108 (90 Base) MCG/ACT inhaler Inhale 2 puffs into the lungs every 6 (six) hours as needed for wheezing or shortness of breath. 03/21/18   Darel Hong, MD  ascorbic acid (VITAMIN C) 500 MG tablet Take 500 mg by mouth daily.    [provider]  Biotin w/ Vitamins C & E (HAIR SKIN & NAILS GUMMIES) 1250-7.5-7.5 MCG-MG-UNT CHEW Chew by mouth daily.    [provider]  carbamazepine (TEGRETOL) 200 MG tablet Take 1 tablet (200 mg total) by mouth every 12 (twelve) hours. 09/03/18   Pucilowska, Herma Ard B, MD  cholecalciferol (VITAMIN D) 1000 units tablet Take 1 tablet (1,000 Units total) by mouth daily. 06/22/17   Tonia Ghent, MD  hydrOXYzine (ATARAX/VISTARIL) 50 MG tablet Take 1 tablet (50 mg total) by mouth 3 (three) times  daily as needed for anxiety. 09/03/18   Pucilowska, Wardell Honour, MD  levothyroxine (SYNTHROID, LEVOTHROID) 88 MCG tablet TAKE 1 TABLET(88 MCG) BY MOUTH DAILY 09/13/18   Tonia Ghent, MD  magic mouthwash SOLN Take 5 mLs by mouth 3 (three) times daily as needed for mouth pain. 09/25/18   Tonia Ghent, MD  Multiple Vitamin (MULTIVITAMIN) tablet Take 1 tablet by mouth daily.      [provider]  Omega-3 Fatty Acids (FISH OIL) 1000 MG CAPS Take 1 capsule (1,000 mg total) by mouth daily. 06/22/17   Tonia Ghent, MD  omeprazole (PRILOSEC) 20 MG capsule TAKE 1 CAPSULE BY MOUTH EVERY MORNING 45 MINUTES BEFORE BREAKFAST 08/01/18   Tonia Ghent, MD  ondansetron (ZOFRAN-ODT) 4 MG disintegrating tablet DISSOLVE 1 TABLET BY MOUTH AS NEEDED 09/24/18   Tonia Ghent, MD  OVER THE COUNTER MEDICATION [place] daily for menopausal symptoms.    [provider]  Oxybutynin Chloride (GELNIQUE) 10 % GEL Place onto the skin daily.      [provider]  oxyCODONE (ROXICODONE) 5 MG immediate release tablet Take 1 tablet (5 mg total) by mouth every 8 (eight) hours as needed. 09/20/18 09/20/19  Rudene Re, MD  risperiDONE (RISPERDAL) 3 MG tablet Take 0.5 tablets (1.5 mg total) by mouth at bedtime. 09/13/18   Tonia Ghent, MD  temazepam (RESTORIL) 15 MG capsule Take 1 capsule (15 mg total) by mouth at bedtime. 09/03/18   Pucilowska, Jolanta B, MD  tiZANidine (ZANAFLEX) 4 MG tablet Take 1 tablet (4 mg total) by mouth 3 (three) times daily. 09/25/18   Tonia Ghent, MD  varenicline (CHANTIX) 1 MG tablet Take 1 mg by mouth 2 (two) times daily.    [provider]  vitamin A (VITAMIN A) 10000 UNIT capsule Take 10,000 Units by mouth daily.    [provider]  vitamin B-12 (CYANOCOBALAMIN) 1000 MCG tablet Take 1,000 mcg by mouth daily.    [provider]  vitamin E 200 UNIT capsule Take 200 Units by mouth daily.      [provider]     Allergies Cymbalta [duloxetine hcl]; Valproic acid; Aspirin; Baclofen; Codeine; Erythromycin; Gabapentin; Metoclopramide hcl; Nsaids; Nucynta er [tapentadol hcl er]; Nucynta [tapentadol]; Prednisone; Pregabalin; Seroquel [quetiapine fumerate]; Sulfa antibiotics; Topamax; and Vicodin [hydrocodone-acetaminophen]  Family History  Problem Relation Age of Onset  . Hypertension Mother   . Arthritis Mother   . Diabetes Mother   . Stroke Mother   . Cancer Father        lung  . Colon cancer Neg Hx   . Breast cancer Neg Hx     Social History Social History   Tobacco Use  . Smoking status: Former Smoker    Packs/day: 1.00    Years: 42.00    Pack years: 42.00    Types: Cigarettes  . Smokeless tobacco: Former Network engineer Use Topics  . Alcohol use: No  Alcohol/week: 0.0 standard drinks  . Drug use: No    Review of Systems Constitutional: No fever/chills Eyes: No visual changes. ENT: No sore throat. Cardiovascular: Denies chest pain. Respiratory: Denies shortness of breath. Gastrointestinal: No abdominal pain.  No nausea, no vomiting.  No diarrhea.  No constipation. Genitourinary: Negative for dysuria. Musculoskeletal: Positive for neck and upper back pain Skin: Negative for rash. Neurological: Negative for headaches, focal weakness or numbness.   ____________________________________________   PHYSICAL EXAM:  VITAL SIGNS: ED Triage Vitals  Enc Vitals Group     BP      Pulse      Resp      Temp      Temp src      SpO2      Weight      Height      Head Circumference      Peak Flow      Pain Score      Pain Loc      Pain Edu?      Excl. in [place]?     Constitutional: Hyperverbal and appears somewhat intoxicated Eyes: PERRL EOMI. midrange and brisk midrange and brisk Midrange and brisk Head: Atraumatic. Nose: No congestion/rhinnorhea. Mouth/Throat: No trismus Neck: No stridor.  No midline tenderness or step-offs.  Some paraspinal spasm Cardiovascular:  Normal rate, regular rhythm. Grossly normal heart sounds.  Good peripheral circulation. Respiratory: Normal respiratory effort.  No retractions. Lungs CTAB and moving good air Gastrointestinal: Soft nontender Musculoskeletal: No lower extremity edema   Neurologic: No gross focal neuro deficits Skin:  Skin is warm, dry and intact. No rash noted. Psychiatric: Hyperverbal    ____________________________________________   DIFFERENTIAL includes but not limited to  Chronic pain, bipolar, depression, marijuana intoxication ____________________________________________   LABS (all labs ordered are listed, but only abnormal results are displayed)  Labs Reviewed - No data to display   __________________________________________  EKG   ____________________________________________  RADIOLOGY   ____________________________________________   PROCEDURES  Procedure(s) performed: no  Procedures  Critical Care performed: no  ____________________________________________   INITIAL IMPRESSION / ASSESSMENT AND PLAN / ED COURSE  Pertinent labs & imaging results that were available during my care of the patient were reviewed by me and considered in my medical decision making (see chart for details).   As part of my medical decision making, I reviewed the following data within the [place] History obtained from family if available, nursing notes, old chart and ekg, as well as notes from prior ED visits.  Patient comes to the emergency department hyperverbal although pleasant and cooperative.  Her neuro exam is at baseline and she says this is an acute exacerbation of her chronic pain.  She said that low-dose ketamine has been effective for her pain in the past and I will give her a dose of ketamine in addition to haloperidol and reevaluate.  ----------------------------------------- 5:54 AM on 10/07/2018 -----------------------------------------  The patient's pain  is improved and she is slept comfortably for several hours.  She is discharged home in improved condition.      ____________________________________________   FINAL CLINICAL IMPRESSION(S) / ED DIAGNOSES  Final diagnoses:  Chronic pain syndrome      NEW MEDICATIONS STARTED DURING THIS VISIT:  New Prescriptions   No medications on file     Note:  This document was prepared using Dragon voice recognition software and may include unintentional dictation errors.     Darel Hong, MD 10/07/18 [phone]

## 2018-10-07 NOTE — ED Notes (Signed)
AAOx3.  Skin warm and dry.  NAD 

## 2018-10-09 ENCOUNTER — Inpatient Hospital Stay: Payer: Self-pay | Admitting: Family Medicine

## 2018-10-11 ENCOUNTER — Other Ambulatory Visit: Payer: Self-pay

## 2018-10-11 ENCOUNTER — Emergency Department
Admission: EM | Admit: 2018-10-11 | Discharge: 2018-10-12 | Disposition: A | Discharge status: Other Acute Inpatient Hospital | Payer: Medicare Other | Attending: Emergency Medicine | Admitting: Emergency Medicine

## 2018-10-11 ENCOUNTER — Telehealth: Payer: Self-pay

## 2018-10-11 ENCOUNTER — Encounter: Payer: Self-pay | Admitting: Family Medicine

## 2018-10-11 ENCOUNTER — Ambulatory Visit (INDEPENDENT_AMBULATORY_CARE_PROVIDER_SITE_OTHER): Payer: Medicare Other | Admitting: Family Medicine

## 2018-10-11 DIAGNOSIS — Z79899 Other long term (current) drug therapy: Secondary | ICD-10-CM | POA: Insufficient documentation

## 2018-10-11 DIAGNOSIS — Z85828 Personal history of other malignant neoplasm of skin: Secondary | ICD-10-CM | POA: Insufficient documentation

## 2018-10-11 DIAGNOSIS — R4586 Emotional lability: Secondary | ICD-10-CM | POA: Diagnosis not present

## 2018-10-11 DIAGNOSIS — Z046 Encounter for general psychiatric examination, requested by authority: Secondary | ICD-10-CM | POA: Insufficient documentation

## 2018-10-11 DIAGNOSIS — F312 Bipolar disorder, current episode manic severe with psychotic features: Secondary | ICD-10-CM | POA: Diagnosis not present

## 2018-10-11 DIAGNOSIS — R462 Strange and inexplicable behavior: Secondary | ICD-10-CM | POA: Insufficient documentation

## 2018-10-11 DIAGNOSIS — F329 Major depressive disorder, single episode, unspecified: Secondary | ICD-10-CM | POA: Insufficient documentation

## 2018-10-11 DIAGNOSIS — F1010 Alcohol abuse, uncomplicated: Secondary | ICD-10-CM

## 2018-10-11 DIAGNOSIS — I6789 Other cerebrovascular disease: Secondary | ICD-10-CM | POA: Diagnosis not present

## 2018-10-11 DIAGNOSIS — F419 Anxiety disorder, unspecified: Secondary | ICD-10-CM | POA: Insufficient documentation

## 2018-10-11 DIAGNOSIS — F32A Depression, unspecified: Secondary | ICD-10-CM

## 2018-10-11 DIAGNOSIS — K449 Diaphragmatic hernia without obstruction or gangrene: Secondary | ICD-10-CM

## 2018-10-11 DIAGNOSIS — M549 Dorsalgia, unspecified: Secondary | ICD-10-CM

## 2018-10-11 DIAGNOSIS — E039 Hypothyroidism, unspecified: Secondary | ICD-10-CM | POA: Insufficient documentation

## 2018-10-11 DIAGNOSIS — R251 Tremor, unspecified: Secondary | ICD-10-CM | POA: Diagnosis not present

## 2018-10-11 DIAGNOSIS — G20 Parkinson's disease: Secondary | ICD-10-CM | POA: Insufficient documentation

## 2018-10-11 DIAGNOSIS — R451 Restlessness and agitation: Secondary | ICD-10-CM | POA: Insufficient documentation

## 2018-10-11 DIAGNOSIS — G8929 Other chronic pain: Secondary | ICD-10-CM | POA: Diagnosis present

## 2018-10-11 DIAGNOSIS — K219 Gastro-esophageal reflux disease without esophagitis: Secondary | ICD-10-CM

## 2018-10-11 DIAGNOSIS — L299 Pruritus, unspecified: Secondary | ICD-10-CM | POA: Insufficient documentation

## 2018-10-11 DIAGNOSIS — Z87891 Personal history of nicotine dependence: Secondary | ICD-10-CM | POA: Insufficient documentation

## 2018-10-11 DIAGNOSIS — Y902 Blood alcohol level of 40-59 mg/100 ml: Secondary | ICD-10-CM | POA: Insufficient documentation

## 2018-10-11 LAB — CBC
HEMATOCRIT: 41.3 % (ref 36.0–46.0)
Hemoglobin: 13.7 g/dL (ref 12.0–15.0)
MCH: 31.8 pg (ref 26.0–34.0)
MCHC: 33.2 g/dL (ref 30.0–36.0)
MCV: 95.8 fL (ref 80.0–100.0)
PLATELETS: 255 10*3/uL (ref 150–400)
RBC: 4.31 MIL/uL (ref 3.87–5.11)
RDW: 14.3 % (ref 11.5–15.5)
WBC: 11.2 10*3/uL — ABNORMAL HIGH (ref 4.0–10.5)
nRBC: 0 % (ref 0.0–0.2)

## 2018-10-11 LAB — POCT PREGNANCY, URINE: PREG TEST UR: NEGATIVE

## 2018-10-11 LAB — URINE DRUG SCREEN, QUALITATIVE (ARMC ONLY)
AMPHETAMINES, UR SCREEN: NOT DETECTED
BENZODIAZEPINE, UR SCRN: NOT DETECTED
Barbiturates, Ur Screen: NOT DETECTED
CANNABINOID 50 NG, UR ~~LOC~~: POSITIVE — AB
Cocaine Metabolite,Ur ~~LOC~~: NOT DETECTED
MDMA (Ecstasy)Ur Screen: NOT DETECTED
Methadone Scn, Ur: NOT DETECTED
OPIATE, UR SCREEN: NOT DETECTED
PHENCYCLIDINE (PCP) UR S: NOT DETECTED
Tricyclic, Ur Screen: NOT DETECTED

## 2018-10-11 LAB — COMPREHENSIVE METABOLIC PANEL
ALK PHOS: 95 U/L (ref 38–126)
ALT: 18 U/L (ref 0–44)
AST: 26 U/L (ref 15–41)
Albumin: 3.7 g/dL (ref 3.5–5.0)
Anion gap: 8 (ref 5–15)
BILIRUBIN TOTAL: 0.5 mg/dL (ref 0.3–1.2)
BUN: 14 mg/dL (ref 6–20)
CALCIUM: 9.7 mg/dL (ref 8.9–10.3)
CO2: 25 mmol/L (ref 22–32)
CREATININE: 0.85 mg/dL (ref 0.44–1.00)
Chloride: 108 mmol/L (ref 98–111)
Glucose, Bld: 77 mg/dL (ref 70–99)
Potassium: 4.7 mmol/L (ref 3.5–5.1)
Sodium: 141 mmol/L (ref 135–145)
TOTAL PROTEIN: 6.6 g/dL (ref 6.5–8.1)

## 2018-10-11 LAB — SALICYLATE LEVEL

## 2018-10-11 LAB — ACETAMINOPHEN LEVEL: Acetaminophen (Tylenol), Serum: <10 ug/mL — ABNORMAL LOW (ref 10–30)

## 2018-10-11 LAB — ETHANOL: ALCOHOL ETHYL (B): 45 mg/dL — AB (ref <10)

## 2018-10-11 MED ORDER — OXYCODONE HCL 5 MG PO TABS
5.0000 mg | ORAL_TABLET | Freq: Four times a day (QID) | ORAL | Status: DC | PRN
  Filled 2018-10-11: qty 1

## 2018-10-11 MED ORDER — ONDANSETRON 4 MG PO TBDP
ORAL_TABLET | ORAL | Status: AC
  Administered 2018-10-11: 4 mg via ORAL
  Filled 2018-10-11: qty 1

## 2018-10-11 MED ORDER — CARBAMAZEPINE 200 MG PO TABS: 200.0000 mg | ORAL_TABLET | Freq: Two times a day (BID) | ORAL | Status: DC

## 2018-10-11 MED ORDER — LAMOTRIGINE 100 MG PO TABS
50.0000 mg | ORAL_TABLET | Freq: Two times a day (BID) | ORAL | Status: DC
  Administered 2018-10-11 – 2018-10-12 (×2): 50 mg via ORAL
  Filled 2018-10-11 (×2): qty 1

## 2018-10-11 MED ORDER — OXYCODONE-ACETAMINOPHEN 5-325 MG PO TABS
1.0000 | ORAL_TABLET | Freq: Four times a day (QID) | ORAL | Status: DC | PRN
  Administered 2018-10-11 – 2018-10-12 (×4): 1 via ORAL
  Filled 2018-10-11 (×4): qty 1

## 2018-10-11 MED ORDER — TEMAZEPAM 7.5 MG PO CAPS
15.0000 mg | ORAL_CAPSULE | Freq: Every evening | ORAL | Status: DC | PRN
  Filled 2018-10-11: qty 2

## 2018-10-11 MED ORDER — HYDROXYZINE PAMOATE 25 MG PO CAPS: 25.0000 mg | ORAL_CAPSULE | Freq: Four times a day (QID) | ORAL | Status: DC | PRN

## 2018-10-11 MED ORDER — MAGIC MOUTHWASH
5.0000 mL | Freq: Three times a day (TID) | ORAL | Status: DC | PRN
  Administered 2018-10-12: 5 mL via ORAL
  Filled 2018-10-11 (×3): qty 10

## 2018-10-11 MED ORDER — PANTOPRAZOLE SODIUM 40 MG PO TBEC: 40.0000 mg | DELAYED_RELEASE_TABLET | Freq: Every day | ORAL | Status: DC

## 2018-10-11 MED ORDER — ONDANSETRON 4 MG PO TBDP
4.0000 mg | ORAL_TABLET | Freq: Three times a day (TID) | ORAL | Status: DC | PRN
  Administered 2018-10-11: 4 mg via ORAL
  Filled 2018-10-11: qty 1

## 2018-10-11 MED ORDER — LEVOTHYROXINE SODIUM 88 MCG PO TABS
88.0000 ug | ORAL_TABLET | Freq: Every day | ORAL | Status: DC
  Administered 2018-10-12: 88 ug via ORAL
  Filled 2018-10-11: qty 1

## 2018-10-11 MED ORDER — HYDROXYZINE HCL 25 MG PO TABS
25.0000 mg | ORAL_TABLET | Freq: Four times a day (QID) | ORAL | Status: DC | PRN
  Administered 2018-10-11 – 2018-10-12 (×3): 25 mg via ORAL
  Filled 2018-10-11 (×3): qty 1

## 2018-10-11 MED ORDER — PANTOPRAZOLE SODIUM 40 MG PO TBEC
40.0000 mg | DELAYED_RELEASE_TABLET | Freq: Every day | ORAL | Status: DC
  Administered 2018-10-12: 40 mg via ORAL
  Filled 2018-10-11 (×2): qty 1

## 2018-10-11 MED ORDER — LEVOTHYROXINE SODIUM 88 MCG PO TABS: 88.0000 ug | ORAL_TABLET | Freq: Every day | ORAL | Status: DC

## 2018-10-11 MED ORDER — RISPERIDONE 3 MG PO TABS: 3.0000 mg | ORAL_TABLET | Freq: Every day | ORAL | Status: DC

## 2018-10-11 NOTE — ED Triage Notes (Signed)
Pt ems from MD office for increasing bazaar behavior, possibly not taking meds per ems personal. Pt a/o  But poor historian.

## 2018-10-11 NOTE — ED Notes (Signed)
Pt states that she is in severe pain and needs her pain meds. MD aware.

## 2018-10-11 NOTE — ED Notes (Addendum)
Patient refused medications stating, "I can't take those psychotic medications."  Patient states she just recently discharged from [facility] with new home medications.

## 2018-10-11 NOTE — BH Assessment (Signed)
Unable to complete TTS assessment as pt was very agitated. She was crying hysterically and crouched over complaining of physical pain.   Will attempt TTS assessment after pt has calmed down.

## 2018-10-11 NOTE — Consult Note (Signed)
[facility] Consult   Reason for Consult: Consult for this patient with a history of bipolar disorder who was sent here from primary care doctor's office because of agitation and bizarre behavior Referring Physician: Alfred Levins Patient Identification: Leslie Wong MRN:  [id number] Principal Diagnosis: Bipolar I disorder, current or most recent episode manic, with psychotic features [facility]) Diagnosis:   Patient Active Problem List   Diagnosis Date Noted  . Bipolar I disorder, current or most recent episode manic, with psychotic features ([place]) [F31.2] 08/28/2018  . Itch of skin [L29.9] 08/15/2018  . Oral ulcer [K12.1] 08/15/2018  . Night sweats [R61] 05/13/2018  . Pupil asymmetry [H57.02] 05/13/2018  . Hx of cold sores [Z86.19] 03/14/2018  . Parkinson disease ([place]) [G20]   . Health care maintenance [Z00.00] 06/23/2017  . HLD (hyperlipidemia) [E78.5] 03/10/2016  . Advance care planning [Z71.89] 03/10/2016  . Generalized abdominal pain [R10.84] 02/18/2015  . Osteopenia [M85.80] 04/18/2014  . Medicare annual wellness visit, initial [Z00.00] 03/18/2014  . Shoulder pain [M25.519] 01/17/2013  . Chronic back pain [M54.9, G89.29] 09/14/2011  . Hypercalcemia [E83.52] 03/25/2010  . NAUSEA WITH VOMITING [R11.2] 05/12/2009  . INCONTINENCE, URGE [N39.41] 04/16/2009  . CARPAL TUNNEL SYNDROME, BILATERAL [G56.00] 09/24/2008  . Tongue irritation [K14.9] 09/24/2008  . URETHRAL STRICTURE [N35.919] 06/13/2007  . Hypothyroidism [E03.9] 06/05/2007  . DISORDER, BIPOLAR NOS [F31.9] 06/05/2007  . MIGRAINE HEADACHE [G43.909] 06/05/2007  . GASTROPARESIS [K31.84] 06/05/2007  . Gastroesophageal reflux disease with hiatal hernia [K21.9, K44.9] 06/05/2007  . IRRITABLE BOWEL SYNDROME [K58.9] 06/05/2007  . FIBROCYSTIC BREAST DISEASE [N60.19] 06/05/2007    Total Time spent with patient: 1 hour  Subjective:   Leslie Freiberger is a 62 y.o. female patient admitted with "I am in  pain".  HPI: Patient seen chart reviewed.  This patient went to visit her primary care doctor today and was acting so strangely and was so agitated and appeared to be so confused that they had her come to the emergency room.  Patient on interview today is somewhat difficult to engage because of how agitated she is.  Talks very rapidly and repeatedly about her chronic pain from her neck injury.  She tells me she has not been sleeping in days.  Has not been eating well.  Denies being depressed but her affect is labile bizarre agitated.  She says she has not been able to take care of her self and has not been staying back at her usual home.  Patient apparently in the time since she was here last has had at least one other hospitalization during which time they took her off of her mood stabilizers.  She is been back to our emergency room more than once with complaints of her pain and was actually given injections of ketamine which she seems to have developed a fondness for.  Medical history: Patient has a history of chronic back pain although the reports that she is making now of her pain are greatly in excess of what has been noticed previously.  He does have a past history of hypothyroidism.  Parkinson's disease is listed in her diagnoses but I do not think that is actually correct.  Social history: Lives independently in an apartment with her dog.  Has a daughter who tries to help her out.  Substance abuse history: Patient denies any in the chart does not have a lot of evidence for it although she has been drinking today and has a blood alcohol level around 50.  He also  has a drug screen positive for cannabis.  Past Psychiatric History: Patient was just here in the hospital about a month ago with a manic episode.  Responded pretty well to Tegretol and Risperdal which she tolerated without any difficulty.  She is been treated with multiple mood stabilizers in the past and claims to not be able to tolerate  many of them.  No known suicide attempts  Risk to Self:   Risk to Others:   Prior Inpatient Therapy:   Prior Outpatient Therapy:    Past Medical History:  Past Medical History:  Diagnosis Date  . Back pain   . Cancer ([place])    skin  . Depression    BAD  . Gastroparesis   . GERD (gastroesophageal reflux disease)   . Hypothyroidism   . Insomnia   . Migraines   . Parkinson disease ([place])    per [facility], dx'd 2019  . Recurrent cold sores   . Shoulder bursitis   . Urge incontinence     Past Surgical History:  Procedure Laterality Date  . ABDOMINAL HYSTERECTOMY  1999   total  . APPENDECTOMY    . ESOPHAGOGASTRODUODENOSCOPY  01/2006   negative except small hiatal hernia  . ESOPHAGOGASTRODUODENOSCOPY  02/24/2015   See report  . LAPAROSCOPY     for endometriosis  . OVARIAN CYST REMOVAL  1990   unilateral  . TONSILLECTOMY AND ADENOIDECTOMY     Family History:  Family History  Problem Relation Age of Onset  . Hypertension Mother   . Arthritis Mother   . Diabetes Mother   . Stroke Mother   . Cancer Father        lung  . Colon cancer Neg Hx   . Breast cancer Neg Hx    Family Psychiatric  History: Unknown Social History:  Social History   Substance and Sexual Activity  Alcohol Use No  . Alcohol/week: 0.0 standard drinks     Social History   Substance and Sexual Activity  Drug Use No    Social History   Socioeconomic History  . Marital status: Divorced    Spouse name: Not on file  . Number of children: Not on file  . Years of education: Not on file  . Highest education level: Not on file  Occupational History  . Not on file  Social Needs  . Financial resource strain: Not on file  . Food insecurity:    Worry: Not on file    Inability: Not on file  . Transportation needs:    Medical: Not on file    Non-medical: Not on file  Tobacco Use  . Smoking status: Former Smoker    Packs/day: 1.00    Years: 42.00    Pack years: 42.00    Types: Cigarettes   . Smokeless tobacco: Former Network engineer and Sexual Activity  . Alcohol use: No    Alcohol/week: 0.0 standard drinks  . Drug use: No  . Sexual activity: Never  Lifestyle  . Physical activity:    Days per week: Not on file    Minutes per session: Not on file  . Stress: Not on file  Relationships  . Social connections:    Talks on phone: Not on file    Gets together: Not on file    Attends religious service: Not on file    Active member of club or organization: Not on file    Attends meetings of clubs or organizations: Not on file  Relationship status: Not on file  Other Topics Concern  . Not on file  Social History Narrative   Lives alone.     Has a Counsellor   Additional Social History:    Allergies:   Allergies  Allergen Reactions  . Cymbalta [Duloxetine Hcl]   . Valproic Acid Shortness Of Breath  . Aspirin     REACTION: UNSPECIFIED  . Baclofen     REACTION: Throat swells up  . Codeine     REACTION: UNSPECIFIED  . Duloxetine Other (See Comments)    Vision loss, weight loss  . Erythromycin     REACTION: UNSPECIFIED  . Gabapentin     REACTION: throat swells up  . Metoclopramide Hcl     REACTION: states "messed me up"  . Nsaids Other (See Comments)    Stomach problems  . Nucynta Er [Tapentadol Hcl Er]     Intolerant, see note from 07/24/12.    Gean [place]ch[place] [Tapentadol]     AMS  . Prednisone     GI intolance  . Pregabalin   . Seroquel [Quetiapine Fumerate] Other (See Comments)    Loss control, felt like on another planet  . Sulfa Antibiotics Nausea And Vomiting  . Topamax Other (See Comments)    Blisters in mouth and tongue  . Vicodin [Hydrocodone-Acetaminophen]     Nausea, thrush and constipation    Labs:  Results for orders placed or performed during the hospital encounter of 10/11/18 (from the past 48 hour(s))  Urine Drug Screen, Qualitative     Status: Abnormal   Collection Time: 10/11/18  2:01 PM  Result Value Ref Range   Tricyclic, Ur  Screen NONE DETECTED NONE DETECTED   Amphetamines, Ur Screen NONE DETECTED NONE DETECTED   MDMA (Ecstasy)Ur Screen NONE DETECTED NONE DETECTED   Cocaine Metabolite,Ur [place] NONE DETECTED NONE DETECTED   Opiate, Ur Screen NONE DETECTED NONE DETECTED   Phencyclidine (PCP) Ur S NONE DETECTED NONE DETECTED   Cannabinoid 50 Ng, Ur [place] POSITIVE (A) NONE DETECTED   Barbiturates, Ur Screen NONE DETECTED NONE DETECTED   Benzodiazepine, Ur Scrn NONE DETECTED NONE DETECTED   Methadone Scn, Ur NONE DETECTED NONE DETECTED    Comment: (NOTE) Tricyclics + metabolites, urine    Cutoff 1000 ng/mL Amphetamines + metabolites, urine  Cutoff 1000 ng/mL MDMA (Ecstasy), urine              Cutoff 500 ng/mL Cocaine Metabolite, urine          Cutoff 300 ng/mL Opiate + metabolites, urine        Cutoff 300 ng/mL Phencyclidine (PCP), urine         Cutoff 25 ng/mL Cannabinoid, urine                 Cutoff 50 ng/mL Barbiturates + metabolites, urine  Cutoff 200 ng/mL Benzodiazepine, urine              Cutoff 200 ng/mL Methadone, urine                   Cutoff 300 ng/mL The urine drug screen provides only a preliminary, unconfirmed analytical test result and should not be used for non-medical purposes. Clinical consideration and professional judgment should be applied to any positive drug screen result due to possible interfering substances. A more specific alternate chemical method must be used in order to obtain a confirmed analytical result. Gas chromatography / mass spectrometry (GC/MS) is the preferred confirmat ory method. Performed  at [facility], [address] [place], [place] [address]   Comprehensive metabolic panel     Status: None   Collection Time: 10/11/18  2:28 PM  Result Value Ref Range   Sodium 141 135 - 145 mmol/L   Potassium 4.7 3.5 - 5.1 mmol/L    Comment: HEMOLYSIS AT THIS LEVEL MAY AFFECT RESULT   Chloride 108 98 - 111 mmol/L   CO2 25 22 - 32 mmol/L   Glucose, Bld 77 70 - 99  mg/dL   BUN 14 6 - 20 mg/dL   Creatinine, Ser 0.85 0.44 - 1.00 mg/dL   Calcium 9.7 8.9 - 10.3 mg/dL   Total Protein 6.6 6.5 - 8.1 g/dL   Albumin 3.7 3.5 - 5.0 g/dL   AST 26 15 - 41 U/L   ALT 18 0 - 44 U/L   Alkaline Phosphatase 95 38 - 126 U/L   Total Bilirubin 0.5 0.3 - 1.2 mg/dL   GFR calc non Af Amer >60 >60 mL/min   GFR calc Af Amer >60 >60 mL/min    Comment: (NOTE) The eGFR has been calculated using the CKD EPI equation. This calculation has not been validated in all clinical situations. eGFR's persistently <60 mL/min signify possible Chronic Kidney Disease.    Anion gap 8 5 - 15    Comment: Performed at [facility], [address] [place], [place] [address]  Ethanol     Status: Abnormal   Collection Time: 10/11/18  2:28 PM  Result Value Ref Range   Alcohol, Ethyl (B) 45 (H) <10 mg/dL    Comment: (NOTE) Lowest detectable limit for serum alcohol is 10 mg/dL. For medical purposes only. Performed at [facility], [address] [place], [place] [address]   Salicylate level     Status: None   Collection Time: 10/11/18  2:28 PM  Result Value Ref Range   Salicylate Lvl <2.7 2.8 - 30.0 mg/dL    Comment: Performed at [facility], [address] [place], [place] [address]  Acetaminophen level     Status: Abnormal   Collection Time: 10/11/18  2:28 PM  Result Value Ref Range   Acetaminophen (Tylenol), Serum <10 (L) 10 - 30 ug/mL    Comment: (NOTE) Therapeutic concentrations vary significantly. A range of 10-30 ug/mL  may be an effective concentration for many patients. However, some  are best treated at concentrations outside of this range. Acetaminophen concentrations >150 ug/mL at 4 hours after ingestion  and >50 ug/mL at 12 hours after ingestion are often associated with  toxic reactions. Performed at [facility], [address] [place], [place] [address]   cbc     Status: Abnormal   Collection Time: 10/11/18  2:28 PM   Result Value Ref Range   WBC 11.2 (H) 4.0 - 10.5 K/uL   RBC 4.31 3.87 - 5.11 MIL/uL   Hemoglobin 13.7 12.0 - 15.0 g/dL   HCT 41.3 36.0 - 46.0 %   MCV 95.8 80.0 - 100.0 fL   MCH 31.8 26.0 - 34.0 pg   MCHC 33.2 30.0 - 36.0 g/dL   RDW 14.3 11.5 - 15.5 %   Platelets 255 150 - 400 K/uL   nRBC 0.0 0.0 - 0.2 %    Comment: Performed at [facility], [address] [place], [place] [address]  Pregnancy, urine POC     Status: None   Collection Time: 10/11/18  4:32 PM  Result Value Ref Range  Preg Test, Ur NEGATIVE NEGATIVE    Comment:        THE SENSITIVITY OF THIS METHODOLOGY IS >24 mIU/mL     Current Facility-Administered Medications  Medication Dose Route Frequency Provider Last Rate Last Dose  . carbamazepine (TEGRETOL) tablet 200 mg  200 mg Oral BID Sharad Vaneaton, [name], MD      . Derrill Memo ON 10/12/2018] levothyroxine (SYNTHROID, LEVOTHROID) tablet 88 mcg  88 mcg Oral QAC breakfast Stetson Pelaez T, MD      . oxyCODONE-acetaminophen (PERCOCET/ROXICET) 5-325 MG per tablet 1 tablet  1 tablet Oral Q6H PRN Jaxie Racanelli T, MD      . pantoprazole (PROTONIX) EC tablet 40 mg  40 mg Oral Daily Thursa Emme T, MD      . risperiDONE (RISPERDAL) tablet 3 mg  3 mg Oral QHS Naheem Mosco T, MD      . temazepam (RESTORIL) capsule 15 mg  15 mg Oral QHS PRN Joshaua Epple, [name], MD       Current Outpatient Medications  Medication Sig Dispense Refill  . albuterol (PROVENTIL HFA;VENTOLIN HFA) 108 (90 Base) MCG/ACT inhaler Inhale 2 puffs into the lungs every 6 (six) hours as needed for wheezing or shortness of breath. 1 Inhaler 0  . ascorbic acid (VITAMIN C) 500 MG tablet Take 500 mg by mouth daily.    . Biotin w/ Vitamins C & E (HAIR SKIN & NAILS GUMMIES) 1250-7.5-7.5 MCG-MG-UNT CHEW Chew by mouth daily.    . carbamazepine (TEGRETOL) 200 MG tablet Take 1 tablet (200 mg total) by mouth every 12 (twelve) hours. 60 tablet 1  . cholecalciferol (VITAMIN D) 1000 units tablet Take 1 tablet (1,000 Units  total) by mouth daily.    . hydrocortisone (CORTEF) 20 MG tablet Take by mouth.    . hydrOXYzine (ATARAX/VISTARIL) 50 MG tablet Take 1 tablet (50 mg total) by mouth 3 (three) times daily as needed for anxiety. 90 tablet 1  . levothyroxine (SYNTHROID, LEVOTHROID) 88 MCG tablet TAKE 1 TABLET(88 MCG) BY MOUTH DAILY 90 tablet 1  . magic mouthwash SOLN Take 5 mLs by mouth 3 (three) times daily as needed for mouth pain. 200 mL 0  . Multiple Vitamin (MULTIVITAMIN) tablet Take 1 tablet by mouth daily.      . Omega-3 Fatty Acids (FISH OIL) 1000 MG CAPS Take 1 capsule (1,000 mg total) by mouth daily.    Marland Kitchen omeprazole (PRILOSEC) 20 MG capsule TAKE 1 CAPSULE BY MOUTH EVERY MORNING 45 MINUTES BEFORE BREAKFAST 90 capsule 0  . ondansetron (ZOFRAN-ODT) 4 MG disintegrating tablet DISSOLVE 1 TABLET BY MOUTH AS NEEDED 20 tablet 0  . OVER THE COUNTER MEDICATION Black Kohash daily for menopausal symptoms.    . Oxybutynin Chloride (GELNIQUE) 10 % GEL Place onto the skin daily.      Marland Kitchen oxyCODONE (ROXICODONE) 5 MG immediate release tablet Take 1 tablet (5 mg total) by mouth every 8 (eight) hours as needed. 5 tablet 0  . risperiDONE (RISPERDAL) 3 MG tablet Take 0.5 tablets (1.5 mg total) by mouth at bedtime. (Patient not taking: Reported on 10/11/2018)    . temazepam (RESTORIL) 15 MG capsule Take 1 capsule (15 mg total) by mouth at bedtime. 30 capsule 0  . tiZANidine (ZANAFLEX) 4 MG tablet Take 1 tablet (4 mg total) by mouth 3 (three) times daily. 30 tablet 0  . valACYclovir (VALTREX) 500 MG tablet Take by mouth.    . varenicline (CHANTIX) 1 MG tablet Take 1 mg by mouth 2 (two) times  daily.    . vitamin A (VITAMIN A) 10000 UNIT capsule Take 10,000 Units by mouth daily.    . vitamin B-12 (CYANOCOBALAMIN) 1000 MCG tablet Take 1,000 mcg by mouth daily.    . vitamin E 200 UNIT capsule Take 200 Units by mouth daily.        Musculoskeletal: Strength & Muscle Tone: within normal limits Gait & Station: normal Patient leans:  N/A  Psychiatric Specialty Exam: Physical Exam  Nursing note and vitals reviewed. Constitutional: She appears well-developed.  HENT:  Head: Normocephalic and atraumatic.  Eyes: Pupils are equal, round, and reactive to light. Conjunctivae are normal.  Neck: Normal range of motion.  Cardiovascular: Regular rhythm and normal heart sounds.  Respiratory: Effort normal. No respiratory distress.  GI: Soft.  Musculoskeletal: Normal range of motion.  Neurological: She is alert.  Skin: Skin is warm and dry.  Psychiatric: Her mood appears anxious. Her affect is labile and inappropriate. Her affect is not blunt. Her speech is rapid and/or pressured and tangential. She is agitated. She is not aggressive. Thought content is not paranoid. Cognition and memory are impaired. She expresses impulsivity and inappropriate judgment. She expresses no homicidal and no suicidal ideation.    Review of Systems  Constitutional: Negative.   HENT: Negative.   Eyes: Negative.   Respiratory: Negative.   Cardiovascular: Negative.   Gastrointestinal: Negative.   Musculoskeletal: Positive for back pain and joint pain.  Skin: Negative.   Neurological: Negative.   Psychiatric/Behavioral: Positive for depression and memory loss. Negative for hallucinations, substance abuse and suicidal ideas. The patient is nervous/anxious and has insomnia.     Blood pressure 135/72, pulse 87, temperature 98.7 F (37.1 C), temperature source Oral, resp. rate 18, height 5' (1.524 m), weight 49 kg, SpO2 98 %.Body mass index is 21.1 kg/m.  General Appearance: Casual  Eye Contact:  Minimal  Speech:  Pressured  Volume:  Increased  Mood:  Irritable  Affect:  Inappropriate and Labile  Thought Process:  Disorganized  Orientation:  Full (Time, Place, and Person)  Thought Content:  Illogical  Suicidal Thoughts:  No  Homicidal Thoughts:  No  Memory:  Immediate;   Fair Recent;   Fair Remote;   Fair  Judgement:  Impaired  Insight:   Shallow  Psychomotor Activity:  Decreased  Concentration:  Concentration: Fair  Recall:  [employer] of Knowledge:  Fair  Language:  Fair  Akathisia:  No  Handed:  Right  AIMS (if indicated):     Assets:  Desire for Improvement Housing Social Support  ADL's:  Impaired  Cognition:  Impaired,  Mild  Sleep:        Treatment Plan Summary: Daily contact with patient to assess and evaluate symptoms and progress in treatment, Medication management and Plan Patient comes into the emergency room agitated somewhat disorganized in her thinking rapid speech pressured speech seems to be having another manic episode combined with a worsening of her complaints of chronic pain.  Although she denies suicidal ideation she is evidently not able to take care of herself adequately.  I recommend we continue the involuntary commitment and plan to admit her to the psychiatric ward.  Case reviewed with TTS and emergency room doctor.  Restart medicines based on what she was taking previously including medicines for bipolar disorder.  Disposition: Recommend psychiatric Inpatient admission when medically cleared. Supportive therapy provided about ongoing stressors.  Alethia Berthold, MD 10/11/2018 5:40 PM

## 2018-10-11 NOTE — ED Notes (Signed)
Patient complaining about delay in receiving home medications.  Patient irritable and anxious stating, "I'm going to need a ketamine shot and this itching is bad from black mold that I breathed in for 12 years."

## 2018-10-11 NOTE — ED Notes (Signed)
MD [name] is currently at her bedside

## 2018-10-11 NOTE — ED Notes (Signed)
Pt tearful. States "no one is listening to me...nobody will give me anything for my pain."

## 2018-10-11 NOTE — ED Notes (Signed)
Pt sitting up eating lunch

## 2018-10-11 NOTE — ED Notes (Signed)
BEHAVIORAL HEALTH ROUNDING Patient sleeping: No. Patient alert and oriented: yes Behavior appropriate: Yes.  ; If no, describe:  Nutrition and fluids offered: yes Toileting and hygiene offered: Yes  Sitter present: q15 minute observations and security  monitoring Law enforcement present: Yes  ODS  

## 2018-10-11 NOTE — Telephone Encounter (Signed)
Dr Reita Cliche from [facility] ED calls to speak with Dr Damita Dunnings; Dr Damita Dunnings took the call.

## 2018-10-11 NOTE — ED Provider Notes (Signed)
[facility] Emergency Department Provider Note ____________________________________________   I have reviewed the triage vital signs and the triage nursing note.  HISTORY  Chief Complaint Tremors and Medical Clearance   Historian Level 5 Caveat History Limited by poor historian  HPI Leslie Wong is a 62 y.o. female presents to the ED for chronic pain.  States that she has chronic pain all over.  Has a history of depression, and reports depressed mood.  Denies suicidal or homocidal ideation.  Denies hallucinations.  Requests to speak with psychiatrist.  States that she has chronic pain all over and sees a pain doctor for that.  No chest pain.  No headache.  Reports aches in her neck and arms and legs.    Past Medical History:  Diagnosis Date  . Back pain   . Cancer ([place])    skin  . Depression    BAD  . Gastroparesis   . GERD (gastroesophageal reflux disease)   . Hypothyroidism   . Insomnia   . Migraines   . Parkinson disease ([place])    per [facility], dx'd 2019  . Recurrent cold sores   . Shoulder bursitis   . Urge incontinence     Patient Active Problem List   Diagnosis Date Noted  . Bipolar I disorder, current or most recent episode manic, with psychotic features ([place]) 08/28/2018  . Itch of skin 08/15/2018  . Oral ulcer 08/15/2018  . Night sweats 05/13/2018  . Pupil asymmetry 05/13/2018  . Hx of cold sores 03/14/2018  . Parkinson disease ([place])   . Health care maintenance 06/23/2017  . HLD (hyperlipidemia) 03/10/2016  . Advance care planning 03/10/2016  . Generalized abdominal pain 02/18/2015  . Osteopenia 04/18/2014  . Medicare annual wellness visit, initial 03/18/2014  . Shoulder pain 01/17/2013  . Chronic back pain 09/14/2011  . Hypercalcemia 03/25/2010  . NAUSEA WITH VOMITING 05/12/2009  . INCONTINENCE, URGE 04/16/2009  . CARPAL TUNNEL SYNDROME, BILATERAL 09/24/2008  . Tongue irritation 09/24/2008  . URETHRAL  STRICTURE 06/13/2007  . Hypothyroidism 06/05/2007  . DISORDER, BIPOLAR NOS 06/05/2007  . MIGRAINE HEADACHE 06/05/2007  . GASTROPARESIS 06/05/2007  . Gastroesophageal reflux disease with hiatal hernia 06/05/2007  . IRRITABLE BOWEL SYNDROME 06/05/2007  . FIBROCYSTIC BREAST DISEASE 06/05/2007    Past Surgical History:  Procedure Laterality Date  . ABDOMINAL HYSTERECTOMY  1999   total  . APPENDECTOMY    . ESOPHAGOGASTRODUODENOSCOPY  01/2006   negative except small hiatal hernia  . ESOPHAGOGASTRODUODENOSCOPY  02/24/2015   See report  . LAPAROSCOPY     for endometriosis  . OVARIAN CYST REMOVAL  1990   unilateral  . TONSILLECTOMY AND ADENOIDECTOMY      Prior to Admission medications   Medication Sig Start Date End Date Taking? Authorizing Provider  albuterol (PROVENTIL HFA;VENTOLIN HFA) 108 (90 Base) MCG/ACT inhaler Inhale 2 puffs into the lungs every 6 (six) hours as needed for wheezing or shortness of breath. 03/21/18   Darel Hong, MD  ascorbic acid (VITAMIN C) 500 MG tablet Take 500 mg by mouth daily.    [provider]  Biotin w/ Vitamins C & E ([place]) 1250-7.5-7.5 MCG-MG-UNT CHEW Chew by mouth daily.    [provider]  carbamazepine (TEGRETOL) 200 MG tablet Take 1 tablet (200 mg total) by mouth every 12 (twelve) hours. 09/03/18   Pucilowska, Herma Ard B, MD  cholecalciferol (VITAMIN D) 1000 units tablet Take 1 tablet (1,000 Units total) by mouth daily. 06/22/17  Tonia Ghent, MD  hydrocortisone (CORTEF) 20 MG tablet Take by mouth.    [provider]  hydrOXYzine (ATARAX/VISTARIL) 50 MG tablet Take 1 tablet (50 mg total) by mouth 3 (three) times daily as needed for anxiety. 09/03/18   Pucilowska, Wardell Honour, MD  levothyroxine (SYNTHROID, LEVOTHROID) 88 MCG tablet TAKE 1 TABLET(88 MCG) BY MOUTH DAILY 09/13/18   Tonia Ghent, MD  magic mouthwash SOLN Take 5 mLs by mouth 3 (three) times daily as needed for mouth pain. 09/25/18   Tonia Ghent, MD  Multiple Vitamin (MULTIVITAMIN) tablet Take 1 tablet by mouth daily.      [provider]  Omega-3 Fatty Acids (FISH OIL) 1000 MG CAPS Take 1 capsule (1,000 mg total) by mouth daily. 06/22/17   Tonia Ghent, MD  omeprazole (PRILOSEC) 20 MG capsule TAKE 1 CAPSULE BY MOUTH EVERY MORNING 45 MINUTES BEFORE BREAKFAST 08/01/18   Tonia Ghent, MD  ondansetron (ZOFRAN-ODT) 4 MG disintegrating tablet DISSOLVE 1 TABLET BY MOUTH AS NEEDED 09/24/18   Tonia Ghent, MD  OVER THE COUNTER MEDICATION [place] Fle[place] daily for menopausal symptoms.    [provider]  Oxybutynin Chloride (GELNIQUE) 10 % GEL Place onto the skin daily.      [provider]  oxyCODONE (ROXICODONE) 5 MG immediate release tablet Take 1 tablet (5 mg total) by mouth every 8 (eight) hours as needed. 09/20/18 09/20/19  Rudene Re, MD  risperiDONE (RISPERDAL) 3 MG tablet Take 0.5 tablets (1.5 mg total) by mouth at bedtime. Patient not taking: Reported on 10/11/2018 09/13/18   Tonia Ghent, MD  temazepam (RESTORIL) 15 MG capsule Take 1 capsule (15 mg total) by mouth at bedtime. 09/03/18   Pucilowska, Jolanta B, MD  tiZANidine (ZANAFLEX) 4 MG tablet Take 1 tablet (4 mg total) by mouth 3 (three) times daily. 09/25/18   Tonia Ghent, MD  valACYclovir (VALTREX) 500 MG tablet Take by mouth.    [provider]  varenicline (CHANTIX) 1 MG tablet Take 1 mg by mouth 2 (two) times daily.    [provider]  vitamin A (VITAMIN A) 10000 UNIT capsule Take 10,000 Units by mouth daily.    [provider]  vitamin B-12 (CYANOCOBALAMIN) 1000 MCG tablet Take 1,000 mcg by mouth daily.    [provider]  vitamin E 200 UNIT capsule Take 200 Units by mouth daily.      [provider]    Allergies  Allergen Reactions  . Cymbalta [Duloxetine Hcl]   . Valproic Acid Shortness Of Breath  . Aspirin     REACTION: UNSPECIFIED  . Baclofen     REACTION: Throat swells  up  . Codeine     REACTION: UNSPECIFIED  . Duloxetine Other (See Comments)    Vision loss, weight loss  . Erythromycin     REACTION: UNSPECIFIED  . Gabapentin     REACTION: throat swells up  . Metoclopramide Hcl     REACTION: states "messed me up"  . Nsaids Other (See Comments)    Stomach problems  . Nucynta Er [Tapentadol Hcl Er]     Intolerant, see note from 07/24/12.    Gean Birchwood [Tapentadol]     AMS  . Prednisone     GI intolance  . Pregabalin   . Seroquel [Quetiapine Fumerate] Other (See Comments)    Loss control, felt like on another planet  . Sulfa Antibiotics Nausea And Vomiting  . Topamax Other (See Comments)  Blisters in mouth and tongue  . Vicodin [Hydrocodone-Acetaminophen]     Nausea, thrush and constipation    Family History  Problem Relation Age of Onset  . Hypertension Mother   . Arthritis Mother   . Diabetes Mother   . Stroke Mother   . Cancer Father        lung  . Colon cancer Neg Hx   . Breast cancer Neg Hx     Social History Social History   Tobacco Use  . Smoking status: Former Smoker    Packs/day: 1.00    Years: 42.00    Pack years: 42.00    Types: Cigarettes  . Smokeless tobacco: Former Network engineer Use Topics  . Alcohol use: No    Alcohol/week: 0.0 standard drinks  . Drug use: No    Review of Systems  Constitutional: Negative for fever. Eyes: Negative for visual changes. ENT: Negative for sore throat. Cardiovascular: Negative for chest pain. Respiratory: Negative for shortness of breath. Gastrointestinal: Negative for abdominal pain, vomiting and diarrhea. Genitourinary: Negative for dysuria. Musculoskeletal: Positive for chronic back pain. Skin: Negative for rash. Neurological: As of her chronic headaches, not currently having headache.   ____________________________________________   PHYSICAL EXAM:  VITAL SIGNS: ED Triage Vitals  Enc Vitals Group     BP 10/11/18 1426 135/72     Pulse Rate 10/11/18 1426 87      Resp 10/11/18 1426 18     Temp 10/11/18 1426 98.7 F (37.1 C)     Temp Source 10/11/18 1426 Oral     SpO2 10/11/18 1426 98 %     Weight 10/11/18 1359 108 lb 0.4 oz (49 kg)     Height 10/11/18 1359 5' (1.524 m)     Head Circumference --      Peak Flow --      Pain Score 10/11/18 1426 0     Pain Loc --      Pain Edu? --      Excl. in [place]? --      Constitutional: Alert and operative, but somewhat rambling speech, poor historian.  Smells of alcohol. HEENT      Head: Normocephalic and atraumatic.      Eyes: Conjunctivae are normal. Pupils equal and round.       Ears:         Nose: No congestion/rhinnorhea.      Mouth/Throat: Mucous membranes are moist.      Neck: No stridor. Cardiovascular/Chest: Normal rate, regular rhythm.  No murmurs, rubs, or gallops. Respiratory: Normal respiratory effort without tachypnea nor retractions. Breath sounds are clear and equal bilaterally. No wheezes/rales/rhonchi. Gastrointestinal: Soft. No distention, no guarding, no rebound. Nontender.    Genitourinary/rectal:Deferred Musculoskeletal: Nontender with normal range of motion in all extremities. No joint effusions.  No lower extremity tenderness.  No edema. Neurologic:  Normal speech and language. No gross or focal neurologic deficits are appreciated. Skin:  Skin is warm, dry and intact. No rash noted. Psychiatric: Depressed mood, somewhat tangential line of thinking.  She is cooperative and redirectable.  Denies suicidal ideation.  Denies homicidal ideation.  Denies hallucinations.   ____________________________________________  LABS (pertinent positives/negatives) I, Lisa Roca, MD the attending physician have reviewed the labs noted below.  Labs Reviewed  ETHANOL - Abnormal; Notable for the following components:      Result Value   Alcohol, Ethyl (B) 45 (*)    All other components within normal limits  ACETAMINOPHEN LEVEL - Abnormal; Notable for the  following components:   Acetaminophen  (Tylenol), Serum <10 (*)    All other components within normal limits  CBC - Abnormal; Notable for the following components:   WBC 11.2 (*)    All other components within normal limits  COMPREHENSIVE METABOLIC PANEL  SALICYLATE LEVEL  URINE DRUG SCREEN, QUALITATIVE ([facility] ONLY)  POC URINE PREG, ED    ____________________________________________    EKG I, Lisa Roca, MD, the attending physician have personally viewed and interpreted all ECGs.  None ____________________________________________  RADIOLOGY   None __________________________________________  PROCEDURES  Procedure(s) performed: None  Procedures  Critical Care performed: None   ____________________________________________  ED COURSE / ASSESSMENT AND PLAN  Pertinent labs & imaging results that were available during my care of the patient were reviewed by me and considered in my medical decision making (see chart for details).     Initially patient's main complaint to me was chronic pain all over.  When asked her specifically where she really could not say and then set her arms and back and legs.  She states this is similar to prior chronic pain, no acute or new or different pain.  No focal headache.  No chest pain.  No trouble breathing.  She smells of alcohol.  Patient states that she came over from Dr. Josefine Class office for "mental health issues.  "  She states that her depression seems little bit worse, but denies suicidal homicidal ideation.   I spoke with Dr. Damita Dunnings by phone, filled in history that patient had been recently erratic behavior over several months based on history it sounds like with the daughter.  Patient has had an inpatient psychiatric admission somewhat recently in [place].  Based on his history, and her alcohol use, I think it warrants involuntary commitment until patient can be evaluated when sober with respect to whether or not she is been on medications whether or not she is  having an acute bipolar episode requiring hospitalization.  CONSULTATIONS: TTS and psychiatry   Patient / Family / Caregiver informed of clinical course, medical decision-making process, and agree with plan.    ___________________________________________   FINAL CLINICAL IMPRESSION(S) / ED DIAGNOSES   Final diagnoses:  Other chronic pain  Alcohol abuse  Depression, unspecified depression type      ___________________________________________         Note: This dictation was prepared with Dragon dictation. Any transcriptional errors that result from this process are unintentional    Lisa Roca, MD 10/11/18 1610

## 2018-10-11 NOTE — ED Notes (Signed)
pts daughter  Rennis Golden  [phone]

## 2018-10-12 ENCOUNTER — Inpatient Hospital Stay: Admission: RE | Admit: 2018-10-12 | Payer: Medicare Other | Source: Intra-hospital | Admitting: Psychiatry

## 2018-10-12 ENCOUNTER — Inpatient Hospital Stay
Admission: AD | Admit: 2018-10-12 | Discharge: 2018-10-22 | DRG: 885 | Disposition: A | Discharge status: Home / Self Care | Payer: Medicare Other | Source: Ambulatory Visit | Attending: Psychiatry | Admitting: Psychiatry

## 2018-10-12 ENCOUNTER — Other Ambulatory Visit: Payer: Self-pay

## 2018-10-12 DIAGNOSIS — G8929 Other chronic pain: Secondary | ICD-10-CM | POA: Diagnosis present

## 2018-10-12 DIAGNOSIS — F419 Anxiety disorder, unspecified: Secondary | ICD-10-CM | POA: Diagnosis present

## 2018-10-12 DIAGNOSIS — Z801 Family history of malignant neoplasm of trachea, bronchus and lung: Secondary | ICD-10-CM | POA: Diagnosis not present

## 2018-10-12 DIAGNOSIS — Z885 Allergy status to narcotic agent status: Secondary | ICD-10-CM

## 2018-10-12 DIAGNOSIS — Z882 Allergy status to sulfonamides status: Secondary | ICD-10-CM | POA: Diagnosis not present

## 2018-10-12 DIAGNOSIS — Z79899 Other long term (current) drug therapy: Secondary | ICD-10-CM | POA: Diagnosis not present

## 2018-10-12 DIAGNOSIS — Z888 Allergy status to other drugs, medicaments and biological substances status: Secondary | ICD-10-CM | POA: Diagnosis not present

## 2018-10-12 DIAGNOSIS — E039 Hypothyroidism, unspecified: Secondary | ICD-10-CM | POA: Diagnosis present

## 2018-10-12 DIAGNOSIS — E785 Hyperlipidemia, unspecified: Secondary | ICD-10-CM | POA: Diagnosis present

## 2018-10-12 DIAGNOSIS — Z881 Allergy status to other antibiotic agents status: Secondary | ICD-10-CM

## 2018-10-12 DIAGNOSIS — F3113 Bipolar disorder, current episode manic without psychotic features, severe: Secondary | ICD-10-CM

## 2018-10-12 DIAGNOSIS — Z85828 Personal history of other malignant neoplasm of skin: Secondary | ICD-10-CM

## 2018-10-12 DIAGNOSIS — Z8619 Personal history of other infectious and parasitic diseases: Secondary | ICD-10-CM

## 2018-10-12 DIAGNOSIS — K219 Gastro-esophageal reflux disease without esophagitis: Secondary | ICD-10-CM | POA: Diagnosis present

## 2018-10-12 DIAGNOSIS — G20 Parkinson's disease: Secondary | ICD-10-CM | POA: Diagnosis present

## 2018-10-12 DIAGNOSIS — N649 Disorder of breast, unspecified: Secondary | ICD-10-CM | POA: Diagnosis present

## 2018-10-12 DIAGNOSIS — F312 Bipolar disorder, current episode manic severe with psychotic features: Secondary | ICD-10-CM | POA: Diagnosis present

## 2018-10-12 DIAGNOSIS — Z87891 Personal history of nicotine dependence: Secondary | ICD-10-CM | POA: Diagnosis not present

## 2018-10-12 DIAGNOSIS — G3184 Mild cognitive impairment, so stated: Secondary | ICD-10-CM | POA: Diagnosis present

## 2018-10-12 DIAGNOSIS — Z79890 Hormone replacement therapy (postmenopausal): Secondary | ICD-10-CM

## 2018-10-12 DIAGNOSIS — Z90710 Acquired absence of both cervix and uterus: Secondary | ICD-10-CM

## 2018-10-12 DIAGNOSIS — F304 Manic episode in full remission: Secondary | ICD-10-CM | POA: Diagnosis not present

## 2018-10-12 DIAGNOSIS — M8580 Other specified disorders of bone density and structure, unspecified site: Secondary | ICD-10-CM | POA: Diagnosis present

## 2018-10-12 MED ORDER — RISPERIDONE 1 MG PO TABS
3.0000 mg | ORAL_TABLET | Freq: Every day | ORAL | Status: DC
  Administered 2018-10-12: 3 mg via ORAL
  Filled 2018-10-12: qty 3

## 2018-10-12 MED ORDER — MAGNESIUM HYDROXIDE 400 MG/5ML PO SUSP
30.0000 mL | Freq: Every day | ORAL | Status: DC | PRN
  Administered 2018-10-14 – 2018-10-20 (×4): 30 mL via ORAL
  Filled 2018-10-12 (×5): qty 30

## 2018-10-12 MED ORDER — PANTOPRAZOLE SODIUM 40 MG PO TBEC
40.0000 mg | DELAYED_RELEASE_TABLET | Freq: Every day | ORAL | Status: DC
  Administered 2018-10-12 – 2018-10-22 (×11): 40 mg via ORAL
  Filled 2018-10-12 (×11): qty 1

## 2018-10-12 MED ORDER — ALUM & MAG HYDROXIDE-SIMETH 200-200-20 MG/5ML PO SUSP: 30.0000 mL | ORAL | Status: DC | PRN

## 2018-10-12 MED ORDER — OXYCODONE-ACETAMINOPHEN 5-325 MG PO TABS
1.0000 | ORAL_TABLET | Freq: Four times a day (QID) | ORAL | Status: DC | PRN
  Administered 2018-10-12 – 2018-10-22 (×19): 1 via ORAL
  Filled 2018-10-12 (×19): qty 1

## 2018-10-12 MED ORDER — CARBAMAZEPINE 200 MG PO TABS
200.0000 mg | ORAL_TABLET | Freq: Two times a day (BID) | ORAL | Status: DC
  Administered 2018-10-13: 200 mg via ORAL
  Filled 2018-10-12: qty 1

## 2018-10-12 MED ORDER — ACETAMINOPHEN 325 MG PO TABS
650.0000 mg | ORAL_TABLET | Freq: Four times a day (QID) | ORAL | Status: DC | PRN
  Administered 2018-10-13 – 2018-10-18 (×2): 650 mg via ORAL
  Filled 2018-10-12 (×2): qty 2

## 2018-10-12 MED ORDER — LEVOTHYROXINE SODIUM 88 MCG PO TABS
88.0000 ug | ORAL_TABLET | Freq: Every day | ORAL | Status: DC
  Administered 2018-10-13 – 2018-10-22 (×10): 88 ug via ORAL
  Filled 2018-10-12 (×10): qty 1

## 2018-10-12 MED ORDER — HYDROXYZINE HCL 50 MG PO TABS
50.0000 mg | ORAL_TABLET | Freq: Three times a day (TID) | ORAL | Status: DC | PRN
  Administered 2018-10-12 – 2018-10-20 (×13): 50 mg via ORAL
  Filled 2018-10-12 (×13): qty 1

## 2018-10-12 MED ORDER — TEMAZEPAM 15 MG PO CAPS
15.0000 mg | ORAL_CAPSULE | Freq: Every evening | ORAL | Status: DC | PRN
  Administered 2018-10-13: 15 mg via ORAL
  Filled 2018-10-12: qty 1

## 2018-10-12 NOTE — Tx Team (Signed)
Initial Treatment Plan 10/12/2018 6:25 PM Leslie Wong [id number]    PATIENT STRESSORS: Health problems Medication change or noncompliance Traumatic event   PATIENT STRENGTHS: Average or above average intelligence Capable of independent living Communication skills Supportive family/friends   PATIENT IDENTIFIED PROBLEMS: Anxiety    Non compliant with medications.                   DISCHARGE CRITERIA:  Ability to meet basic life and health needs Medical problems require only outpatient monitoring Verbal commitment to aftercare and medication compliance  PRELIMINARY DISCHARGE PLAN: Attend aftercare/continuing care group Return to previous living arrangement  PATIENT/FAMILY INVOLVEMENT: This treatment plan has been presented to and reviewed with the patient, Leslie Wong, and/or family member, .  The patient and family have been given the opportunity to ask questions and make suggestions.  Merlene Morse, RN 10/12/2018, 6:25 PM

## 2018-10-12 NOTE — ED Notes (Signed)
Pt to nurses station door requesting more pain medication and "the itching medication". RN reminded patient these medications cannot be given again for a few hours. Pt accepting. Pt also made aware of pending transfer to BMU.   Maintained on 15 minute checks and observation by security camera for safety.

## 2018-10-12 NOTE — ED Provider Notes (Signed)
-----------------------------------------   9:18 AM on 10/12/2018 -----------------------------------------   Blood pressure 135/72, pulse 87, temperature 98.7 F (37.1 C), temperature source Oral, resp. rate 18, height 5' (1.524 m), weight 49 kg, SpO2 98 %.  The patient had no acute events since last update.  Calm and cooperative at this time.  Disposition is pending Psychiatry/Behavioral Medicine team recommendations.     Merlyn Lot, MD 10/12/18 [phone]

## 2018-10-12 NOTE — [facility] Group Notes (Signed)
[facility] Group Notes:  (Nursing/MHT/Case Management/Adjunct)  Date:  10/12/2018  Time:  9:26 PM  Type of Therapy:  Group Therapy  Participation Level:  Active  Participation Quality:  Appropriate  Affect:  Appropriate  Cognitive:  Alert  Insight:  Good  Engagement in Group:  Engaged  Modes of Intervention:  Support  Summary of Progress/Problems:  Leslie Wong 10/12/2018, 9:26 PM

## 2018-10-12 NOTE — Progress Notes (Signed)
Patient looks anxious and verbalized depression 6/10 but cooperative during admission assessment. Patient denies SI/HI at this time. Patient denies AVH. Patient informed of fall risk status, fall risk assessed "low" at this time. Patient oriented to unit/staff/room. Patient denies any questions/concerns at this time. Patient safe on unit with Q15 minute checks for safety. Skin assessment and body search done,no contraband found.

## 2018-10-12 NOTE — Progress Notes (Signed)
Patient ID: Leslie Wong, female   DOB: May 29, 1956, 62 y.o.   MRN: [id number] PER STATE REGULATIONS 482.30  THIS CHART WAS REVIEWED FOR MEDICAL NECESSITY WITH RESPECT TO THE PATIENT'S ADMISSION/DURATION OF STAY.  NEXT REVIEW DATE:10/16/18  Roma Schanz, RN, BSN CASE MANAGER

## 2018-10-12 NOTE — ED Notes (Signed)
Pt with numerous physical / medical complaints.  Pt requesting to know when her surgery will be scheduled to fix her pinched nerve. Pt also wanting Dr. Weber Cooks to call her daughter.   Maintained on 15 minute checks and observation by security camera for safety.

## 2018-10-12 NOTE — BH Assessment (Signed)
Patient is to be admitted to [facility] by Dr. Weber Cooks.  Attending Physician will be Dr. Wonda Olds.   Patient has been assigned to room 305, by [facility] Nurse T'Yawn.   ER staff is aware of the admission:  Sherry Ruffing, ER Secretary    Dr. Quentin Cornwall, ER MD   Amy B., Patient's Nurse   Elberta Fortis, Patient Access.

## 2018-10-12 NOTE — ED Notes (Signed)
Pt discharged to BMU under IVC. VS stable. Report called to T'yawn, Therapist, sports. All belongings will be sent with patient.
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
## 2018-10-12 NOTE — Telephone Encounter (Signed)
My clinic note is not done at the time of the call.  Late entry.  We discussed the clinical situation.  The patient had been acting oddly at home.  She been more tearful, drinking alcohol, admitted to marijuana use, had been more irritable.  Daughter was concerned about patient safety at home.  Patient agreed to ER evaluation with EMS transport.  She will likely need medical evaluation at the emergency room and then psychiatric evaluation.  I appreciate the help of all involved.

## 2018-10-13 DIAGNOSIS — F3113 Bipolar disorder, current episode manic without psychotic features, severe: Secondary | ICD-10-CM

## 2018-10-13 LAB — HEMOGLOBIN A1C
Hgb A1c MFr Bld: 5.4 % (ref 4.8–5.6)
Mean Plasma Glucose: 108.28 mg/dL

## 2018-10-13 LAB — TSH: TSH: 1.444 u[IU]/mL (ref 0.350–4.500)

## 2018-10-13 LAB — LIPID PANEL
CHOL/HDL RATIO: 4.3 ratio
Cholesterol: 168 mg/dL (ref 0–200)
HDL: 39 mg/dL — AB (ref >40)
LDL CALC: 103 mg/dL — AB (ref 0–99)
Triglycerides: 130 mg/dL (ref <150)
VLDL: 26 mg/dL (ref 0–40)

## 2018-10-13 MED ORDER — ONDANSETRON HCL 4 MG PO TABS
4.0000 mg | ORAL_TABLET | Freq: Three times a day (TID) | ORAL | Status: DC | PRN
  Administered 2018-10-16 – 2018-10-19 (×2): 4 mg via ORAL
  Filled 2018-10-13 (×3): qty 1

## 2018-10-13 MED ORDER — LAMOTRIGINE 25 MG PO TABS
50.0000 mg | ORAL_TABLET | Freq: Every day | ORAL | Status: DC
  Administered 2018-10-13 – 2018-10-22 (×10): 50 mg via ORAL
  Filled 2018-10-13 (×10): qty 2

## 2018-10-13 MED ORDER — RISPERIDONE 1 MG PO TABS
1.5000 mg | ORAL_TABLET | Freq: Every day | ORAL | Status: DC
  Administered 2018-10-13 – 2018-10-14 (×2): 1.5 mg via ORAL
  Filled 2018-10-13 (×2): qty 2

## 2018-10-13 MED ORDER — TIZANIDINE HCL 4 MG PO TABS
4.0000 mg | ORAL_TABLET | Freq: Three times a day (TID) | ORAL | Status: DC | PRN
  Administered 2018-10-14 – 2018-10-21 (×17): 4 mg via ORAL
  Filled 2018-10-13 (×19): qty 1

## 2018-10-13 NOTE — Progress Notes (Signed)
D: Patient denies SI/HI/AVH, affect is blunted, speech is less pressured, appears less  Anxious, she is interacting appropriately with peers and staff. patient is making appropriate eye contact with staff, receptive no distress noted.  A: Patient was offered support and encouragement, and was given scheduled medications, and  encouraged to attend groups. 15 minute checks were done for safety R:Pt attends groups and interacts appropriately with peers and staff. Patient is taking medication, and has no complaints.15 minutes safety checks maintained will continue to monitor closely.

## 2018-10-13 NOTE — H&P (Addendum)
Psychiatric Admission Assessment Adult  Patient Identification: Leslie Wong MRN:  [id number] Date of Evaluation:  10/13/2018 Chief Complaint:  bipolar Principal Diagnosis: <principal problem not specified> Diagnosis:   Patient Active Problem List   Diagnosis Date Noted  . Bipolar 1 disorder, manic, full remission ([place]) [F31.74] 10/12/2018  . Bipolar I disorder, current or most recent episode manic, with psychotic features ([place]) [F31.2] 08/28/2018  . Itch of skin [L29.9] 08/15/2018  . Oral ulcer [K12.1] 08/15/2018  . Night sweats [R61] 05/13/2018  . Pupil asymmetry [H57.02] 05/13/2018  . Hx of cold sores [Z86.19] 03/14/2018  . Parkinson disease ([place]) [G20]   . Health care maintenance [Z00.00] 06/23/2017  . HLD (hyperlipidemia) [E78.5] 03/10/2016  . Advance care planning [Z71.89] 03/10/2016  . Generalized abdominal pain [R10.84] 02/18/2015  . Osteopenia [M85.80] 04/18/2014  . Medicare annual wellness visit, initial [Z00.00] 03/18/2014  . Shoulder pain [M25.519] 01/17/2013  . Chronic back pain [M54.9, G89.29] 09/14/2011  . Hypercalcemia [E83.52] 03/25/2010  . NAUSEA WITH VOMITING [R11.2] 05/12/2009  . INCONTINENCE, URGE [N39.41] 04/16/2009  . CARPAL TUNNEL SYNDROME, BILATERAL [G56.00] 09/24/2008  . Tongue irritation [K14.9] 09/24/2008  . URETHRAL STRICTURE [N35.919] 06/13/2007  . Hypothyroidism [E03.9] 06/05/2007  . DISORDER, BIPOLAR NOS [F31.9] 06/05/2007  . MIGRAINE HEADACHE [G43.909] 06/05/2007  . GASTROPARESIS [K31.84] 06/05/2007  . Gastroesophageal reflux disease with hiatal hernia [K21.9, K44.9] 06/05/2007  . IRRITABLE BOWEL SYNDROME [K58.9] 06/05/2007  . FIBROCYSTIC BREAST DISEASE [N60.19] 06/05/2007   History of Present Illness:  My medicine is not right, I am having pain" Pt is 62 y/o F with h/o Bipolar d/o , admitted under IVC. Per report,  patient went to visit her primary care doctor  and was acting so strangely and was so agitated and appeared to be so  confused that they had her come to the emergency room.  pt endorsing depression, anxiety, labile, irritable, pressured speech, talks  about her chronic pain from her neck injury.   she has not been sleeping in days,  not been eating well prior to admission.   she has not been able to take care of her self and has not been staying back at her usual home.  she reports  She was hospitalized at [facility] for a week, and released 1 week ago, states she is on lamictal 50mg  and not on tegretol, states her Op psychiatrist told her to take only 1.5 mg of Risperdal. Pt denies SI/HI. Denies AVH.   She is been back to our emergency room more than once with complaints of her pain and was actually given injections of ketamine, pt states she has pain Dr in [facility] who giving her oxycodone 5-10mg  .      Substance abuse history:  blood alcohol level 45. Urine  drug screen positive for cannabis. States she only used THC and drink alcohol occasionally.     Total Time spent with patient: 1 hour  Past Psychiatric History:  was hospitalized at [facility] for a week, and released about 1 week ago, sttses she is on now  lamictal 50mg , tegretol stopped at [facility]. Pt was here at [facility] about a month ago with a manic episode.  Responded pretty well to Tegretol and Risperdal , pt states she cannot take tegretol.  She is been treated with multiple mood stabilizers in the past and claims to not be able to tolerate many of them.  No known suicide attempts   Is the patient at risk to self? Yes.  Has the patient been a risk to self in the past 6 months? Yes.    Has the patient been a risk to self within the distant past? Yes.    Is the patient a risk to others? No.  Has the patient been a risk to others in the past 6 months? No.  Has the patient been a risk to others within the distant past? No.   Prior Inpatient Therapy:   Prior Outpatient Therapy:    Alcohol Screening: 1. How often do you  have a drink containing alcohol?: Monthly or less 2. How many drinks containing alcohol do you have on a typical day when you are drinking?: 1 or 2 3. How often do you have six or more drinks on one occasion?: Never AUDIT-C Score: 1 4. How often during the last year have you found that you were not able to stop drinking once you had started?: Never 5. How often during the last year have you failed to do what was normally expected from you becasue of drinking?: Less than monthly 6. How often during the last year have you needed a first drink in the morning to get yourself going after a heavy drinking session?: Never 7. How often during the last year have you had a feeling of guilt of remorse after drinking?: Never 8. How often during the last year have you been unable to remember what happened the night before because you had been drinking?: Never 9. Have you or someone else been injured as a result of your drinking?: No 10. Has a relative or friend or a doctor or another health worker been concerned about your drinking or suggested you cut down?: No Alcohol Use Disorder Identification Test Final Score (AUDIT): 2 Intervention/Follow-up: AUDIT Score <7 follow-up not indicated Substance Abuse History in the last 12 months:  Yes.   Consequences of Substance Abuse:  Previous Psychotropic Medications: Yes  Psychological Evaluations:  Past Medical History:  Past Medical History:  Diagnosis Date  . Back pain   . Cancer ([place])    skin  . Depression    BAD  . Gastroparesis   . GERD (gastroesophageal reflux disease)   . Hypothyroidism   . Insomnia   . Migraines   . Parkinson disease ([place])    per [facility], dx'd 2019  . Recurrent cold sores   . Shoulder bursitis   . Urge incontinence     Past Surgical History:  Procedure Laterality Date  . ABDOMINAL HYSTERECTOMY  1999   total  . APPENDECTOMY    . ESOPHAGOGASTRODUODENOSCOPY  01/2006   negative except small hiatal hernia  .  ESOPHAGOGASTRODUODENOSCOPY  02/24/2015   See report  . LAPAROSCOPY     for endometriosis  . OVARIAN CYST REMOVAL  1990   unilateral  . TONSILLECTOMY AND ADENOIDECTOMY     Family History:  Family History  Problem Relation Age of Onset  . Hypertension Mother   . Arthritis Mother   . Diabetes Mother   . Stroke Mother   . Cancer Father        lung  . Colon cancer Neg Hx   . Breast cancer Neg Hx    Family Psychiatric  History: unknown Tobacco Screening: Have you used any form of tobacco in the last 30 days? (Cigarettes, Smokeless Tobacco, Cigars, and/or Pipes): Yes Tobacco use, Select all that apply: 4 or less cigarettes per day Are you interested in Tobacco Cessation Medications?: Yes, will notify MD for an order Counseled  patient on smoking cessation including recognizing danger situations, developing coping skills and basic information about quitting provided: Yes Social History:  Social History   Substance and Sexual Activity  Alcohol Use No  . Alcohol/week: 0.0 standard drinks     Social History   Substance and Sexual Activity  Drug Use No    Additional Social History:      Pt  lives independently in an apartment with her dog.  Has a daughter who tries to help her out.                     Allergies:   Allergies  Allergen Reactions  . Cymbalta [Duloxetine Hcl]   . Valproic Acid Shortness Of Breath  . Aspirin     REACTION: UNSPECIFIED  . Baclofen     REACTION: Throat swells up  . Codeine     REACTION: UNSPECIFIED  . Duloxetine Other (See Comments)    Vision loss, weight loss  . Erythromycin     REACTION: UNSPECIFIED  . Gabapentin     REACTION: throat swells up  . Metoclopramide Hcl     REACTION: states "messed me up"  . Nsaids Other (See Comments)    Stomach problems  . Nucynta Er [Tapentadol Hcl Er]     Intolerant, see note from 07/24/12.    Gean [place]ch[place] [Tapentadol]     AMS  . Prednisone     GI intolance  . Pregabalin   . Seroquel  [Quetiapine Fumerate] Other (See Comments)    Loss control, felt like on another planet  . Sulfa Antibiotics Nausea And Vomiting  . Topamax Other (See Comments)    Blisters in mouth and tongue  . Vicodin [Hydrocodone-Acetaminophen]     Nausea, thrush and constipation   Lab Results:  Results for orders placed or performed during the hospital encounter of 10/12/18 (from the past 48 hour(s))  Lipid panel     Status: Abnormal   Collection Time: 10/13/18  6:51 AM  Result Value Ref Range   Cholesterol 168 0 - 200 mg/dL   Triglycerides 130 <150 mg/dL   HDL 39 (L) >40 mg/dL   Total CHOL/HDL Ratio 4.3 RATIO   VLDL 26 0 - 40 mg/dL   LDL Cholesterol 103 (H) 0 - 99 mg/dL    Comment:        Total Cholesterol/HDL:CHD Risk Coronary Heart Disease Risk Table                     Men   Women  1/2 Average Risk   3.4   3.3  Average Risk       5.0   4.4  2 X Average Risk   9.6   7.1  3 X Average Risk  23.4   11.0        Use the calculated Patient Ratio above and the CHD Risk Table to determine the patient's CHD Risk.        ATP III CLASSIFICATION (LDL):  <100     mg/dL   Optimal  100-129  mg/dL   Near or Above                    Optimal  130-159  mg/dL   Borderline  160-189  mg/dL   High  >190     mg/dL   Very High Performed at [facility], [address], [place], [place] [address]   TSH     Status:  None   Collection Time: 10/13/18  6:51 AM  Result Value Ref Range   TSH 1.444 0.350 - 4.500 uIU/mL    Comment: Performed by a 3rd Generation assay with a functional sensitivity of <=0.01 uIU/mL. Performed at [facility], [address] [place], [place] [address]     Blood Alcohol level:  Lab Results  Component Value Date   ETH 45 (H) 10/11/2018   ETH <10 56/43/3295    Metabolic Disorder Labs:  Lab Results  Component Value Date   HGBA1C 5.5 08/29/2018   MPG 111.15 08/29/2018   No results found for: PROLACTIN Lab Results  Component Value Date   CHOL 168  10/13/2018   TRIG 130 10/13/2018   HDL 39 (L) 10/13/2018   CHOLHDL 4.3 10/13/2018   VLDL 26 10/13/2018   LDLCALC 103 (H) 10/13/2018   LDLCALC 107 (H) 08/29/2018    Current Medications: Current Facility-Administered Medications  Medication Dose Route Frequency Provider Last Rate Last Dose  . acetaminophen (TYLENOL) tablet 650 mg  650 mg Oral Q6H PRN [name], [name], MD   650 mg at 10/13/18 1884  . alum & mag hydroxide-simeth (MAALOX/MYLANTA) 200-200-20 MG/5ML suspension 30 mL  30 mL Oral Q4H PRN [name], [name], MD      . carbamazepine (TEGRETOL) tablet 200 mg  200 mg Oral BID [name], [name], MD   200 mg at 10/13/18 0804  . hydrOXYzine (ATARAX/VISTARIL) tablet 50 mg  50 mg Oral TID PRN [name], [name], MD   50 mg at 10/13/18 1326  . levothyroxine (SYNTHROID, LEVOTHROID) tablet 88 mcg  88 mcg Oral QAC breakfast [name], [name], MD   88 mcg at 10/13/18 0804  . magnesium hydroxide (MILK OF MAGNESIA) suspension 30 mL  30 mL Oral Daily PRN [name], [name], MD      . oxyCODONE-acetaminophen (PERCOCET/ROXICET) 5-325 MG per tablet 1 tablet  1 tablet Oral Q6H PRN [name], [name], MD   1 tablet at 10/13/18 1039  . pantoprazole (PROTONIX) EC tablet 40 mg  40 mg Oral Daily [name], [name], MD   40 mg at 10/13/18 0804  . risperiDONE (RISPERDAL) tablet 3 mg  3 mg Oral QHS [name], [name], MD   3 mg at 10/12/18 2138  . temazepam (RESTORIL) capsule 15 mg  15 mg Oral QHS PRN [name], [name], MD       PTA Medications: Medications Prior to Admission  Medication Sig Dispense Refill Last Dose  . albuterol (PROVENTIL HFA;VENTOLIN HFA) 108 (90 Base) MCG/ACT inhaler Inhale 2 puffs into the lungs every 6 (six) hours as needed for wheezing or shortness of breath. 1 Inhaler 0 Unknown at PRN  . ascorbic acid (VITAMIN C) 500 MG tablet Take 500 mg by mouth daily.   Unknown at Unknown  . Biotin w/ Vitamins C & E (HAIR SKIN & NAILS GUMMIES) 1250-7.5-7.5 MCG-MG-UNT CHEW Chew by mouth daily.   Unknown at Unknown  .  carbamazepine (TEGRETOL) 200 MG tablet Take 1 tablet (200 mg total) by mouth every 12 (twelve) hours. 60 tablet 1 Past Week at Unknown time  . cholecalciferol (VITAMIN D) 1000 units tablet Take 1 tablet (1,000 Units total) by mouth daily.   Unknown at Unknown  . hydrOXYzine (ATARAX/VISTARIL) 50 MG tablet Take 1 tablet (50 mg total) by mouth 3 (three) times daily as needed for anxiety. (Patient not taking: Reported on 10/11/2018) 90 tablet 1 Not Taking at Unknown time  . hydrOXYzine (VISTARIL) 25 MG capsule Take  25 mg by mouth every 6 (six) hours as needed for anxiety or itching.   Unknown at PRN  . lamoTRIgine (LAMICTAL) 25 MG tablet Take 50 mg by mouth 2 (two) times daily.  0 Past Week at unknown  . levothyroxine (SYNTHROID, LEVOTHROID) 88 MCG tablet TAKE 1 TABLET(88 MCG) BY MOUTH DAILY 90 tablet 1 Past Week at Unknown  . magic mouthwash SOLN Take 5 mLs by mouth 3 (three) times daily as needed for mouth pain. (Patient not taking: Reported on 10/12/2018) 200 mL 0 Not Taking at Unknown time  . Multiple Vitamin (MULTIVITAMIN) tablet Take 1 tablet by mouth daily.     Unknown at Unknown  . Omega-3 Fatty Acids (FISH OIL) 1000 MG CAPS Take 1 capsule (1,000 mg total) by mouth daily.   Unknown at Unknown  . omeprazole (PRILOSEC) 20 MG capsule TAKE 1 CAPSULE BY MOUTH EVERY MORNING 45 MINUTES BEFORE BREAKFAST (Patient taking differently: Take 20 mg by mouth daily. TAKE 1 CAPSULE BY MOUTH EVERY MORNING 45 MINUTES BEFORE BREAKFAST) 90 capsule 0 unknown at unknown  . ondansetron (ZOFRAN-ODT) 4 MG disintegrating tablet DISSOLVE 1 TABLET BY MOUTH AS NEEDED (Patient taking differently: Take 4 mg by mouth 3 (three) times daily as needed for nausea or vomiting. ) 20 tablet 0 Past Week at PRN  . OVER THE COUNTER MEDICATION [place] daily for menopausal symptoms.   Unknown at Unknown  . Oxybutynin Chloride (GELNIQUE) 10 % GEL Place onto the skin daily.     Unknown at Unknown  . oxyCODONE (ROXICODONE) 5 MG immediate  release tablet Take 1 tablet (5 mg total) by mouth every 8 (eight) hours as needed. (Patient taking differently: Take 5 mg by mouth every 6 (six) hours as needed. ) 5 tablet 0 prn at prn  . paliperidone (INVEGA) 6 MG 24 hr tablet Take 6 mg by mouth at bedtime.   Past Week at Unknown time  . risperiDONE (RISPERDAL) 3 MG tablet Take 0.5 tablets (1.5 mg total) by mouth at bedtime.   Past Week at Unknown time  . temazepam (RESTORIL) 15 MG capsule Take 1 capsule (15 mg total) by mouth at bedtime. 30 capsule 0 Past Week at Unknown time  . tiZANidine (ZANAFLEX) 4 MG tablet Take 1 tablet (4 mg total) by mouth 3 (three) times daily. 30 tablet 0 unknown at unknown  . valACYclovir (VALTREX) 500 MG tablet Take 500 mg by mouth daily.    Past Week at Unknown  . vitamin A (VITAMIN A) 10000 UNIT capsule Take 10,000 Units by mouth daily.   Unknown at Unknown  . vitamin B-12 (CYANOCOBALAMIN) 1000 MCG tablet Take 1,000 mcg by mouth daily.   Unknown at Unknown  . vitamin E 200 UNIT capsule Take 200 Units by mouth daily.     Unknown at Unknown    Musculoskeletal: Strength & Muscle Tone: within normal limits Gait & Station: normal Patient leans:   Psychiatric Specialty Exam: Physical Exam  Nursing note and vitals reviewed.   ROS  Blood pressure (!) 77/54, pulse (!) 114, temperature 99.3 F (37.4 C), temperature source Oral, resp. rate 18, height 4\' 11"  (1.499 m), weight 48.5 kg, SpO2 97 %.Body mass index is 21.61 kg/m.  General Appearance: Casual  Eye Contact:  Minimal  Speech:  Pressured  Volume:  Increased  Mood:  Irritable  Affect:  Labile, irritable  Thought Process:  Disorganized  Orientation:  Full (Time, Place, and Person)  Thought Content:  Illogical, concrete  Suicidal Thoughts:  No  Homicidal Thoughts:  No  Memory:  Immediate;   Fair Recent;   Fair Remote;   Fair  Judgement:  Impaired  Insight:  Shallow  Psychomotor Activity:  increased  Concentration:  Concentration: Fair  Recall:   [employer] of Knowledge:  Fair  Language:  Fair  Akathisia:  No  Handed:  Right  AIMS (if indicated):     Assets:  Desire for Improvement Housing Social Support  ADL's:  Impaired  Cognition:  Impaired,  Mild  Sleep:   7.5     Treatment Plan Summary: Daily contact with patient to assess and evaluate symptoms and progress in treatment and Medication management Pt with bipolar d/o presenting with mood lability, mania, chr pain.  Pt agreeable to continue lamictal and take 1.5 mg risperdal.  IVC.  Group/milieu tx.  Chr Pain- oxycodone, zanaflex.   Plan- Observation Level/Precautions:  15 minute checks  Laboratory:  CBC Chemistry Profile HCG UDS UA  Psychotherapy:    Medications:    Consultations:    Discharge Concerns:    Estimated LOS:  Other:     Physician Treatment Plan for Primary Diagnosis: <principal problem not specified> Long Term Goal(s): Improvement in symptoms so as ready for discharge  Short Term Goals: Ability to identify changes in lifestyle to reduce recurrence of condition will improve, Ability to verbalize feelings will improve, Ability to disclose and discuss suicidal ideas, Ability to demonstrate self-control will improve, Ability to identify and develop effective coping behaviors will improve, Ability to maintain clinical measurements within normal limits will improve, Compliance with prescribed medications will improve and Ability to identify triggers associated with substance abuse/mental health issues will improve  Physician Treatment Plan for Secondary Diagnosis: Active Problems:   Bipolar 1 disorder, manic, full remission ([place])  Long Term Goal(s): Improvement in symptoms so as ready for discharge  Short Term Goals: Ability to identify changes in lifestyle to reduce recurrence of condition will improve, Ability to verbalize feelings will improve, Ability to disclose and discuss suicidal ideas, Ability to demonstrate self-control will improve, Ability  to identify and develop effective coping behaviors will improve, Ability to maintain clinical measurements within normal limits will improve, Compliance with prescribed medications will improve and Ability to identify triggers associated with substance abuse/mental health issues will improve  I certify that inpatient services furnished can reasonably be expected to improve the patient's condition.    Lenward Chancellor, MD 10/19/20192:34 PM

## 2018-10-13 NOTE — Plan of Care (Signed)
Verbalize understanding of information received. Emotional and mental status improved Knowledgeable of resources available  to meet health care needs . Working on Scientific laboratory technician . Working on concerns that affect anxiety  Problem: Education: Goal: Knowledge of [facility] Education information/materials will improve Outcome: Progressing Goal: Emotional status will improve Outcome: Progressing Goal: Mental status will improve Outcome: Progressing Goal: Verbalization of understanding the information provided will improve Outcome: Progressing   Problem: Health Behavior/Discharge Planning: Goal: Identification of resources available to assist in meeting health care needs will improve Outcome: Progressing Goal: Compliance with treatment plan for underlying cause of condition will improve Outcome: Progressing   Problem: Coping: Goal: Ability to identify and develop effective coping behavior will improve Outcome: Progressing   Problem: Self-Concept: Goal: Ability to identify factors that promote anxiety will improve Outcome: Progressing Goal: Level of anxiety will decrease Outcome: Progressing Goal: Ability to modify response to factors that promote anxiety will improve Outcome: Progressing

## 2018-10-13 NOTE — [facility] Group Notes (Signed)
LCSW Group Therapy Note  10/13/2018 1:15pm  Type of Therapy and Topic:  Group Therapy:  Cognitive Distortions  Participation Level:  Active   Description of Group:    Patients in this group will be introduced to the topic of cognitive distortions.  Patients will identify and describe cognitive distortions, describe the feelings these distortions create for them.  Patients will identify one or more situations in their personal life where they have cognitively distorted thinking and will verbalize challenging this cognitive distortion through positive thinking skills.  Patients will practice the skill of using positive affirmations to challenge cognitive distortions using affirmation cards.    Therapeutic Goals:  1. Patient will identify two or more cognitive distortions they have used 2. Patient will identify one or more emotions that stem from use of a cognitive distortion 3. Patient will demonstrate use of a positive affirmation to counter a cognitive distortion through discussion and/or role play. 4. Patient will describe one way cognitive distortions can be detrimental to wellness   Summary of Patient Progress: The patient reported that she feels "rough." Patients were introduced to the topic of cognitive distortions. The patient was able to identify and describe cognitive distortions, described the feelings these distortions create for her.The patient shared her unhelpful thinking style is "mental filter." Patient identified a situation in her personal life where she has cognitively distorted thinking and was able to verbalize and challenged this cognitive distortion through positive thinking skills. Patient was able to provide support and validation to other group members.      Therapeutic Modalities:   Cognitive Behavioral Therapy Motivational Interviewing   Zianne Schubring  CUEBAS-COLON, LCSW 10/13/2018 10:35 AM

## 2018-10-13 NOTE — Progress Notes (Signed)
D: Patient stated slept good last night .Stated appetite fairand energy level low. Stated concentration poor . Stated on Depression scale ,5 hopeless 0 and anxiety 5 .( low 0-10 high) Denies suicidal  homicidal ideations  .  No auditory hallucinations  No pain concerns . Appropriate ADL'S. Interacting with peers and staff. Verbalize understanding of information received. Emotional and mental status improved Knowledgeable of resources available  to meet health care needs . Working on Scientific laboratory technician . Working on concerns that affect anxiety Attending unit programing.  Patient complaints of old injuries  A: Encourage patient participation with unit programming . Instruction  Given on  Medication , verbalize understanding.  R: Voice no other concerns. Staff continue to monitor

## 2018-10-13 NOTE — BHH Suicide Risk Assessment (Signed)
[facility] Admission Suicide Risk Assessment   Nursing information obtained from:  Patient Demographic factors:  Caucasian, Living alone Current Mental Status:  NA Loss Factors:  NA Historical Factors:  NA Risk Reduction Factors:  Positive coping skills or problem solving skills  Total Time spent with patient: 1 hour Principal Problem: <principal problem not specified> Diagnosis:   Patient Active Problem List   Diagnosis Date Noted  . Bipolar disorder with severe mania ([place]) [F31.13]   . Bipolar 1 disorder, manic, full remission ([place]) [F31.74] 10/12/2018  . Bipolar I disorder, current or most recent episode manic, with psychotic features ([place]) [F31.2] 08/28/2018  . Itch of skin [L29.9] 08/15/2018  . Oral ulcer [K12.1] 08/15/2018  . Night sweats [R61] 05/13/2018  . Pupil asymmetry [H57.02] 05/13/2018  . Hx of cold sores [Z86.19] 03/14/2018  . Parkinson disease ([place]) [G20]   . Health care maintenance [Z00.00] 06/23/2017  . HLD (hyperlipidemia) [E78.5] 03/10/2016  . Advance care planning [Z71.89] 03/10/2016  . Generalized abdominal pain [R10.84] 02/18/2015  . Osteopenia [M85.80] 04/18/2014  . Medicare annual wellness visit, initial [Z00.00] 03/18/2014  . Shoulder pain [M25.519] 01/17/2013  . Chronic back pain [M54.9, G89.29] 09/14/2011  . Hypercalcemia [E83.52] 03/25/2010  . NAUSEA WITH VOMITING [R11.2] 05/12/2009  . INCONTINENCE, URGE [N39.41] 04/16/2009  . CARPAL TUNNEL SYNDROME, BILATERAL [G56.00] 09/24/2008  . Tongue irritation [K14.9] 09/24/2008  . URETHRAL STRICTURE [N35.919] 06/13/2007  . Hypothyroidism [E03.9] 06/05/2007  . DISORDER, BIPOLAR NOS [F31.9] 06/05/2007  . MIGRAINE HEADACHE [G43.909] 06/05/2007  . GASTROPARESIS [K31.84] 06/05/2007  . Gastroesophageal reflux disease with hiatal hernia [K21.9, K44.9] 06/05/2007  . IRRITABLE BOWEL SYNDROME [K58.9] 06/05/2007  . FIBROCYSTIC BREAST DISEASE [N60.19] 06/05/2007   Subjective Data:  My medicine is not right, I am having  pain" Pt is 62 y/o F with h/o Bipolar d/o , admitted under IVC. Per report,  patient went to visit her primary care doctor  and was acting so strangely and was so agitated and appeared to be so confused that they had her come to the emergency room. pt endorsing depression, anxiety, labile, irritable, pressured speech, talks  about her chronic pain from her neck injury.  she has not been sleeping in days,  not been eating well prior to admission.  she has not been able to take care of her self and has not been staying back at her usual home. she reports  She was hospitalized at [facility] for a week, and released 1 week ago, states she is on lamictal 50mg  and not on tegretol, states her Op psychiatrist told her to take only 1.5 mg of Risperdal. Pt denies SI/HI. Denies AVH.  She is been back to our emergency room more than once with complaints of her pain and was actually given injections of ketamine, pt states she has pain Dr in [facility] who giving her oxycodone 5-10mg  .   Continued Clinical Symptoms:  Alcohol Use Disorder Identification Test Final Score (AUDIT): 2 The "Alcohol Use Disorders Identification Test", Guidelines for Use in Primary Care, Second Edition.  World Pharmacologist [facility]). Score between 0-7:  no or low risk or alcohol related problems. Score between 8-15:  moderate risk of alcohol related problems. Score between 16-19:  high risk of alcohol related problems. Score 20 or above:  warrants further diagnostic evaluation for alcohol dependence and treatment.   CLINICAL FACTORS:   mood lability, anxiety,    Musculoskeletal: Strength & Muscle Tone: within normal limits Gait & Station: normal Patient leans:  Psychiatric Specialty Exam: Physical Exam  Nursing note and vitals reviewed.   ROS  Blood pressure (!) 77/54, pulse (!) 114, temperature 99.3 F (37.4 C), temperature source Oral, resp. rate 18, height 4\' 11"  (1.499 m), weight 48.5 kg, SpO2 97 %.Body  mass index is 21.61 kg/m.  General Appearance:Casual  Eye Contact:Minimal  Speech:Pressured  Volume:Increased  Mood:Irritable  Affect: Labile, irritable  Thought Process:Disorganized  Orientation:Full (Time, Place, and Person)  Thought Content:Illogical, concrete  Suicidal Thoughts:No  Homicidal Thoughts:No  Memory:Immediate;Fair Recent;Fair Remote;[place]  Akathisia:No  Handed:Right  AIMS (if indicated):   Assets:Desire for Improvement Housing Social Support  ADL's:Impaired  Cognition:Impaired,Mild  Sleep: 7.5       COGNITIVE FEATURES THAT CONTRIBUTE TO RISK:  Closed-mindedness    SUICIDE RISK:   moderate  PLAN OF CARE:  Daily contact with patient to assess and evaluate symptoms and progress in treatment and Medication management Pt with bipolar d/o presenting with mood lability, mania, chr pain.  Pt agreeable to continue lamictal and take 1.5 mg risperdal.  IVC.  Group/milieu tx.  Chr Pain- oxycodone, zanaflex.   Plan- Observation Level/Precautions:  15 minute checks  Laboratory:  CBC Chemistry Profile HCG UDS UA  Psychotherapy:    Medications:    Consultations:    Discharge Concerns:    Estimated LOS:  Other:     Physician Treatment Plan for Primary Diagnosis: <principal problem not specified> Long Term Goal(s): Improvement in symptoms so as ready for discharge  Short Term Goals: Ability to identify changes in lifestyle to reduce recurrence of condition will improve, Ability to verbalize feelings will improve, Ability to disclose and discuss suicidal ideas, Ability to demonstrate self-control will improve, Ability to identify and develop effective coping behaviors will improve, Ability to maintain clinical measurements within normal  limits will improve, Compliance with prescribed medications will improve and Ability to identify triggers associated with substance abuse/mental health issues will improve  Physician Treatment Plan for Secondary Diagnosis: Active Problems:   Bipolar 1 disorder, manic, full remission ([place])  Long Term Goal(s): Improvement in symptoms so as ready for discharge  Short Term Goals: Ability to identify changes in lifestyle to reduce recurrence of condition will improve, Ability to verbalize feelings will improve, Ability to disclose and discuss suicidal ideas, Ability to demonstrate self-control will improve, Ability to identify and develop effective coping behaviors will improve, Ability to maintain clinical measurements within normal limits will improve, Compliance with prescribed medications will improve and Ability to identify triggers associated with substance abuse/mental health issues will improve   I certify that inpatient services furnished can reasonably be expected to improve the patient's condition.   Lenward Chancellor, MD 10/13/2018, 3:05 PM

## 2018-10-13 NOTE — Plan of Care (Signed)
  Problem: Education: Goal: Knowledge of [facility] Education information/materials will improve Outcome: Progressing  Patient has general knowledge of information and materials provided.

## 2018-10-14 ENCOUNTER — Encounter: Payer: Self-pay | Admitting: Family Medicine

## 2018-10-14 DIAGNOSIS — R4586 Emotional lability: Secondary | ICD-10-CM | POA: Insufficient documentation

## 2018-10-14 MED ORDER — BUPROPION HCL 75 MG PO TABS
75.0000 mg | ORAL_TABLET | Freq: Every day | ORAL | Status: DC
  Administered 2018-10-14 – 2018-10-15 (×2): 75 mg via ORAL
  Filled 2018-10-14 (×2): qty 1

## 2018-10-14 MED ORDER — BENZOCAINE 10 % MT GEL
Freq: Four times a day (QID) | OROMUCOSAL | Status: DC | PRN
  Filled 2018-10-14: qty 9

## 2018-10-14 MED ORDER — CHLORHEXIDINE GLUCONATE 0.12 % MT SOLN
15.0000 mL | Freq: Two times a day (BID) | OROMUCOSAL | Status: DC
  Administered 2018-10-14 – 2018-10-17 (×5): 15 mL via OROMUCOSAL
  Filled 2018-10-14 (×7): qty 15

## 2018-10-14 MED ORDER — LORAZEPAM 1 MG PO TABS
1.0000 mg | ORAL_TABLET | Freq: Three times a day (TID) | ORAL | Status: DC | PRN
  Administered 2018-10-14 – 2018-10-15 (×2): 1 mg via ORAL
  Filled 2018-10-14 (×2): qty 1

## 2018-10-14 MED ORDER — FLUTICASONE PROPIONATE 50 MCG/ACT NA SUSP
1.0000 | Freq: Two times a day (BID) | NASAL | Status: DC | PRN
  Administered 2018-10-15 – 2018-10-21 (×6): 1 via NASAL
  Filled 2018-10-14: qty 16

## 2018-10-14 MED ORDER — VALACYCLOVIR HCL 500 MG PO TABS
500.0000 mg | ORAL_TABLET | Freq: Every day | ORAL | Status: DC
  Administered 2018-10-14 – 2018-10-22 (×9): 500 mg via ORAL
  Filled 2018-10-14 (×9): qty 1

## 2018-10-14 NOTE — Progress Notes (Signed)
[facility] MD Progress Note  10/14/2018 11:10 AM Leslie Wong  MRN:  [id number] Subjective:   Pt is 62 y/o F with h/o Bipolar d/o , admitted under IVC. Per report,  patient went to visit her primary care doctor  and was acting so strangely and was so agitated and appeared to be so confused that they had her come to the emergency room. pt endorsing depression, anxiety, labile, irritable, pressured speech, talks  about her chronic pain from her neck injury. I feel ok but My Medicine is not right" Pt anxious, labile, focussed on her meds, denies SI, denies AVH. Pt states she is on valtrex, Wellbutrin, Flonase, mouth wash and also wanting ativan .   Principal Problem: <principal problem not specified> Diagnosis:   Patient Active Problem List   Diagnosis Date Noted  . Bipolar disorder with severe mania ([place]) [F31.13]   . Bipolar 1 disorder, manic, full remission ([place]) [F31.74] 10/12/2018  . Bipolar I disorder, current or most recent episode manic, with psychotic features ([place]) [F31.2] 08/28/2018  . Itch of skin [L29.9] 08/15/2018  . Oral ulcer [K12.1] 08/15/2018  . Night sweats [R61] 05/13/2018  . Pupil asymmetry [H57.02] 05/13/2018  . Hx of cold sores [Z86.19] 03/14/2018  . Parkinson disease ([place]) [G20]   . Health care maintenance [Z00.00] 06/23/2017  . HLD (hyperlipidemia) [E78.5] 03/10/2016  . Advance care planning [Z71.89] 03/10/2016  . Generalized abdominal pain [R10.84] 02/18/2015  . Osteopenia [M85.80] 04/18/2014  . Medicare annual wellness visit, initial [Z00.00] 03/18/2014  . Shoulder pain [M25.519] 01/17/2013  . Chronic back pain [M54.9, G89.29] 09/14/2011  . Hypercalcemia [E83.52] 03/25/2010  . NAUSEA WITH VOMITING [R11.2] 05/12/2009  . INCONTINENCE, URGE [N39.41] 04/16/2009  . CARPAL TUNNEL SYNDROME, BILATERAL [G56.00] 09/24/2008  . Tongue irritation [K14.9] 09/24/2008  . URETHRAL STRICTURE [N35.919] 06/13/2007  . Hypothyroidism [E03.9] 06/05/2007  . DISORDER, BIPOLAR  NOS [F31.9] 06/05/2007  . MIGRAINE HEADACHE [G43.909] 06/05/2007  . GASTROPARESIS [K31.84] 06/05/2007  . Gastroesophageal reflux disease with hiatal hernia [K21.9, K44.9] 06/05/2007  . IRRITABLE BOWEL SYNDROME [K58.9] 06/05/2007  . FIBROCYSTIC BREAST DISEASE [N60.19] 06/05/2007   Total Time spent with patient: 25 min  Past Psychiatric History: see H & P  Past Medical History:  Past Medical History:  Diagnosis Date  . Back pain   . Cancer ([place])    skin  . Depression    BAD  . Gastroparesis   . GERD (gastroesophageal reflux disease)   . Hypothyroidism   . Insomnia   . Migraines   . Parkinson disease ([place])    per [place] [facility], dx'd 2019  . Recurrent cold sores   . Shoulder bursitis   . Urge incontinence     Past Surgical History:  Procedure Laterality Date  . ABDOMINAL HYSTERECTOMY  1999   total  . APPENDECTOMY    . ESOPHAGOGASTRODUODENOSCOPY  01/2006   negative except small hiatal hernia  . ESOPHAGOGASTRODUODENOSCOPY  02/24/2015   See report  . LAPAROSCOPY     for endometriosis  . OVARIAN CYST REMOVAL  1990   unilateral  . TONSILLECTOMY AND ADENOIDECTOMY     Family History:  Family History  Problem Relation Age of Onset  . Hypertension Mother   . Arthritis Mother   . Diabetes Mother   . Stroke Mother   . Cancer Father        lung  . Colon cancer Neg Hx   . Breast cancer Neg Hx    Family Psychiatric  History: see H & P  Social History:  Social History   Substance and Sexual Activity  Alcohol Use No  . Alcohol/week: 0.0 standard drinks     Social History   Substance and Sexual Activity  Drug Use No    Social History   Socioeconomic History  . Marital status: Divorced    Spouse name: Not on file  . Number of children: Not on file  . Years of education: Not on file  . Highest education level: Not on file  Occupational History  . Not on file  Social Needs  . Financial resource strain: Not on file  . Food insecurity:    Worry: Not on file     Inability: Not on file  . Transportation needs:    Medical: Not on file    Non-medical: Not on file  Tobacco Use  . Smoking status: Former Smoker    Packs/day: 1.00    Years: 42.00    Pack years: 42.00    Types: Cigarettes  . Smokeless tobacco: Former Network engineer and Sexual Activity  . Alcohol use: No    Alcohol/week: 0.0 standard drinks  . Drug use: No  . Sexual activity: Never  Lifestyle  . Physical activity:    Days per week: Not on file    Minutes per session: Not on file  . Stress: Not on file  Relationships  . Social connections:    Talks on phone: Not on file    Gets together: Not on file    Attends religious service: Not on file    Active member of club or organization: Not on file    Attends meetings of clubs or organizations: Not on file    Relationship status: Not on file  Other Topics Concern  . Not on file  Social History Narrative   Lives alone.     Has a Counsellor   Additional Social History:                         Sleep: Fair  Appetite:  Fair  Current Medications: Current Facility-Administered Medications  Medication Dose Route Frequency Provider Last Rate Last Dose  . acetaminophen (TYLENOL) tablet 650 mg  650 mg Oral Q6H PRN [name], [name], MD   650 mg at 10/13/18 4742  . alum & mag hydroxide-simeth (MAALOX/MYLANTA) 200-200-20 MG/5ML suspension 30 mL  30 mL Oral Q4H PRN [name], [name], MD      . hydrOXYzine (ATARAX/VISTARIL) tablet 50 mg  50 mg Oral TID PRN [name], [name], MD   50 mg at 10/14/18 0444  . lamoTRIgine (LAMICTAL) tablet 50 mg  50 mg Oral Daily Lenward Chancellor, MD   50 mg at 10/14/18 0805  . levothyroxine (SYNTHROID, LEVOTHROID) tablet 88 mcg  88 mcg Oral QAC breakfast [name], [name], MD   88 mcg at 10/14/18 0805  . LORazepam (ATIVAN) tablet 1 mg  1 mg Oral TID PRN Lenward Chancellor, MD   1 mg at 10/14/18 1047  . magnesium hydroxide (MILK OF MAGNESIA) suspension 30 mL  30 mL Oral Daily PRN [name], [name], MD       . ondansetron (ZOFRAN) tablet 4 mg  4 mg Oral TID PRN Lenward Chancellor, MD      . oxyCODONE-acetaminophen (PERCOCET/ROXICET) 5-325 MG per tablet 1 tablet  1 tablet Oral Q6H PRN [name], [name], MD   1 tablet at 10/14/18 1010  . pantoprazole (PROTONIX) EC tablet 40 mg  40 mg  Oral Daily [name], [name], MD   40 mg at 10/14/18 0805  . risperiDONE (RISPERDAL) tablet 1.5 mg  1.5 mg Oral QHS Lenward Chancellor, MD   1.5 mg at 10/13/18 2136  . temazepam (RESTORIL) capsule 15 mg  15 mg Oral QHS PRN [name], [name], MD   15 mg at 10/13/18 2136  . tiZANidine (ZANAFLEX) tablet 4 mg  4 mg Oral TID PRN Lenward Chancellor, MD   4 mg at 10/14/18 0444    Lab Results:  Results for orders placed or performed during the hospital encounter of 10/12/18 (from the past 48 hour(s))  Hemoglobin A1c     Status: None   Collection Time: 10/13/18  6:51 AM  Result Value Ref Range   Hgb A1c MFr Bld 5.4 4.8 - 5.6 %    Comment: (NOTE) Pre diabetes:          5.7%-6.4% Diabetes:              >6.4% Glycemic control for   <7.0% adults with diabetes    Mean Plasma Glucose 108.28 mg/dL    Comment: Performed at [facility], [place] [address], [place], [place] [address]  Lipid panel     Status: Abnormal   Collection Time: 10/13/18  6:51 AM  Result Value Ref Range   Cholesterol 168 0 - 200 mg/dL   Triglycerides 130 <150 mg/dL   HDL 39 (L) >40 mg/dL   Total CHOL/HDL Ratio 4.3 RATIO   VLDL 26 0 - 40 mg/dL   LDL Cholesterol 103 (H) 0 - 99 mg/dL    Comment:        Total Cholesterol/HDL:CHD Risk Coronary Heart Disease Risk Table                     Men   Women  1/2 Average Risk   3.4   3.3  Average Risk       5.0   4.4  2 X Average Risk   9.6   7.1  3 X Average Risk  23.4   11.0        Use the calculated Patient Ratio above and the CHD Risk Table to determine the patient's CHD Risk.        ATP III CLASSIFICATION (LDL):  <100     mg/dL   Optimal  100-129  mg/dL   Near or Above                    Optimal   130-159  mg/dL   Borderline  160-189  mg/dL   High  >190     mg/dL   Very High Performed at [facility], [place], [place] [address]   TSH     Status: None   Collection Time: 10/13/18  6:51 AM  Result Value Ref Range   TSH 1.444 0.350 - 4.500 uIU/mL    Comment: Performed by a 3rd Generation assay with a functional sensitivity of <=0.01 uIU/mL. Performed at [facility], [address] [place], [place] [address]     Blood Alcohol level:  Lab Results  Component Value Date   ETH 45 (H) 10/11/2018   ETH <10 65/68/1275    Metabolic Disorder Labs: Lab Results  Component Value Date   HGBA1C 5.4 10/13/2018   MPG 108.28 10/13/2018   MPG 111.15 08/29/2018   No results found for: PROLACTIN Lab Results  Component Value Date   CHOL 168 10/13/2018  TRIG 130 10/13/2018   HDL 39 (L) 10/13/2018   CHOLHDL 4.3 10/13/2018   VLDL 26 10/13/2018   LDLCALC 103 (H) 10/13/2018   LDLCALC 107 (H) 08/29/2018    Physical Findings: AIMS:  , ,  ,  ,    CIWA:    COWS:     Musculoskeletal: Strength & Muscle Tone: within normal limits Gait & Station: normal Patient leans:   Psychiatric Specialty Exam: Physical Exam  Nursing note and vitals reviewed.   ROS  Blood pressure 108/74, pulse 98, temperature 99.3 F (37.4 C), temperature source Oral, resp. rate 18, height 4\' 11"  (1.499 m), weight 48.5 kg, SpO2 96 %.Body mass index is 21.61 kg/m.  General Appearance:Casual  Eye Contact:Minimal  Speech:Pressured  Volume:Increased  Mood:anxious  Affect: Labile,  anxious  Thought Process:Disorganized  Orientation:Full (Time, Place, and Person)  Thought Content:Illogical, concrete  Suicidal Thoughts:No  Homicidal Thoughts:No  Memory:Immediate;Fair Recent;Fair Remote;[place]  Akathisia:No  Handed:Right  AIMS (if indicated):   Assets:Desire for Improvement Housing Social Support  ADL's:Impaired  Cognition:Impaired,Mild  Sleep: 5.45       Treatment Plan Summary: Daily contact with patient to assess and evaluate symptoms and progress in treatment and Medication management Pt with bipolar d/o presenting with mood lability, mania, chr pain.  Pt agreeable to continue lamictal and take 1.5 mg risperdal.  IVC.  Group/milieu tx.  Chr Pain- oxycodone, zanaflex Resume home meds, prn ativan for anxiety  Lenward Chancellor, MD 10/14/2018, 11:10 AM

## 2018-10-14 NOTE — Progress Notes (Signed)
Patient stayed in the milieu and remained cooperative. Paced in the hallway but pleasant and cooperative. Received her bedtime medications and did not have any major concerns. Went to bed and has been resting. Staff continue to monitor for safety.

## 2018-10-14 NOTE — Plan of Care (Signed)
Active in the milieu, pleasant and cooperative.

## 2018-10-14 NOTE — BHH Group Notes (Signed)
LCSW Group Therapy Note 10/14/2018 1:15pm  Type of Therapy and Topic: Group Therapy: Feelings Around Returning Home & Establishing a Supportive Framework and Supporting Oneself When Supports Not Available  Participation Level: Did Not Attend  Description of Group:  Patients first processed thoughts and feelings about upcoming discharge. These included fears of upcoming changes, lack of change, new living environments, judgements and expectations from others and overall stigma of mental health issues. The group then discussed the definition of a supportive framework, what that looks and feels like, and how do to discern it from an unhealthy non-supportive network. The group identified different types of supports as well as what to do when your family/friends are less than helpful or unavailable  Therapeutic Goals  1. Patient will identify one healthy supportive network that they can use at discharge. 2. Patient will identify one factor of a supportive framework and how to tell it from an unhealthy network. 3. Patient able to identify one coping skill to use when they do not have positive supports from others. 4. Patient will demonstrate ability to communicate their needs through discussion and/or role plays.  Summary of Patient Progress:  Pt was invited to attend group but chose not to attend. CSW will continue to encourage pt to attend group throughout their admission.   Therapeutic Modalities Cognitive Behavioral Therapy Motivational Interviewing   Zyquan Crotty  CUEBAS-COLON, LCSW 10/14/2018 12:03 PM

## 2018-10-14 NOTE — Assessment & Plan Note (Signed)
There are multiple issues here.  The daughter was asking if we could "get down to the bottom of things" meaning address the primary source.  I agree that should be our goal and we discussed.  To do that she needs to be off substance and she is likely going to need emergency room evaluation/medical clearance and then psychiatric facility placement for ongoing treatment.  The patient voluntarily agreed to ER eval with EMS transport.  911 called, case discussed with EMS at the scene and then with ER MD via phone. I appreciate the help of all involved.  >25 minutes spent in face to face time with patient, >50% spent in counselling or coordination of care.
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
## 2018-10-14 NOTE — Progress Notes (Signed)
Patient was coming in for follow-up today.  I was informed by staff that the patient's daughter wanted to keep the patient in the car until the moment of her office visit so that she would not have to wait in the waiting room.  The patient had been acting atypically and was likely intoxicated with alcohol.  The patient's daughter did not want there to be any extra distractions in the waiting room.  The patient's daughter was able to talk to me after the patient was safely placed in an exam room.  Her daughter gave me this information not in the presence of the patient.  Reportedly, the patient had been getting more irritable and acting strangely since she had left her previous inpatient psychiatric treatment facility.  She had been reportedly drinking alcohol and smoking marijuana.  She was getting into arguments with her neighbors.  She had not been aggressive or suicidal or homicidal but her thoughts had been disorganized.  She had not been sleeping on a regular basis.  Her daughter brought her in for follow-up today.  In talking with the patient, she admits to smoking marijuana and drinking alcohol recently.  She denies suicidal or homicidal intent.  Is difficult to get a history from the patient because her conversation is very tangential.  She was fixated on eating the onion rings that she had with her at the office visit.  She did tell me "God bless you" and she gave me a hug.  Her understanding was that I was trying to help her situation so that she would feel better and she was grateful for that.  We talked about options and in the midst of this conversation the patient agreed voluntarily to emergency room by evaluation along with EMS transport.    PMH and SH reviewed  ROS: Per HPI unless specifically indicated in ROS section   Meds, vitals, and allergies reviewed.   GEN: nad, alert but her conversation and answers are tangential.  She is overly active, walking around the exam room, not sitting  in a chair.  She was not combative or aggressive. HEENT: mucous membranes moist NECK: supple w/o LA CV: rrr.  PULM: ctab, no inc wob ABD: soft, +bs EXT: no edema She has a mild tremor noted in her hands. Moving all extremities symmetrically.

## 2018-10-14 NOTE — Plan of Care (Signed)
Patient is A&O x 4, ambulates the unit with steady gait. Frequents the nursing station with multiple somatic issues. Issues addressed with MD and medications prescribed to combat symptoms. Compliant with medications and meal. Denies SI/HI/AVH at this time. Milieu remains safe at this time, will continue to monitor.

## 2018-10-15 LAB — CBC WITH DIFFERENTIAL/PLATELET
ABS IMMATURE GRANULOCYTES: 0.04 10*3/uL (ref 0.00–0.07)
BASOS ABS: 0 10*3/uL (ref 0.0–0.1)
Basophils Relative: 0 %
EOS PCT: 2 %
Eosinophils Absolute: 0.2 10*3/uL (ref 0.0–0.5)
HEMATOCRIT: 37.7 % (ref 36.0–46.0)
HEMOGLOBIN: 12.3 g/dL (ref 12.0–15.0)
Immature Granulocytes: 0 %
LYMPHS ABS: 1.9 10*3/uL (ref 0.7–4.0)
LYMPHS PCT: 21 %
MCH: 32.3 pg (ref 26.0–34.0)
MCHC: 32.6 g/dL (ref 30.0–36.0)
MCV: 99 fL (ref 80.0–100.0)
MONO ABS: 1.1 10*3/uL — AB (ref 0.1–1.0)
Monocytes Relative: 12 %
NEUTROS ABS: 6.1 10*3/uL (ref 1.7–7.7)
Neutrophils Relative %: 65 %
Platelets: 228 10*3/uL (ref 150–400)
RBC: 3.81 MIL/uL — ABNORMAL LOW (ref 3.87–5.11)
RDW: 13.9 % (ref 11.5–15.5)
WBC: 9.3 10*3/uL (ref 4.0–10.5)
nRBC: 0 % (ref 0.0–0.2)

## 2018-10-15 MED ORDER — RISPERIDONE 1 MG PO TABS: 2.0000 mg | ORAL_TABLET | Freq: Every day | ORAL | Status: DC

## 2018-10-15 MED ORDER — RISPERIDONE 1 MG PO TABS: 1.5000 mg | ORAL_TABLET | Freq: Every day | ORAL | Status: DC

## 2018-10-15 MED ORDER — CLOZAPINE 25 MG PO TABS
12.5000 mg | ORAL_TABLET | Freq: Every day | ORAL | Status: DC
  Administered 2018-10-15: 12.5 mg via ORAL
  Filled 2018-10-15: qty 1

## 2018-10-15 MED ORDER — TRAZODONE HCL 50 MG PO TABS
50.0000 mg | ORAL_TABLET | Freq: Every evening | ORAL | Status: DC | PRN
  Administered 2018-10-15 – 2018-10-16 (×2): 50 mg via ORAL
  Filled 2018-10-15 (×2): qty 1

## 2018-10-15 MED ORDER — BUPROPION HCL ER (XL) 150 MG PO TB24
150.0000 mg | ORAL_TABLET | Freq: Every day | ORAL | Status: DC
  Administered 2018-10-16 – 2018-10-22 (×7): 150 mg via ORAL
  Filled 2018-10-15 (×7): qty 1

## 2018-10-15 MED ORDER — BLISTEX MEDICATED EX OINT
TOPICAL_OINTMENT | CUTANEOUS | Status: DC | PRN
  Administered 2018-10-17 – 2018-10-21 (×4): via TOPICAL
  Filled 2018-10-15: qty 6.3

## 2018-10-15 NOTE — Progress Notes (Signed)
   10/15/18 1030  Clinical Encounter Type  Visited With Patient;Health care provider  Visit Type Initial;Spiritual support;Behavioral Health  Referral From Social work  Consult/Referral To Chaplain  Spiritual Encounters  Spiritual Needs Emotional;Other (Comment)   [name] attended the patient's treatment team meeting. The patient has been a [facility] patient before and the staff is familiar with Leslie Wong. The patient stated that she smoked pot lacked with something and that is why she is back in the [name]. She has a support system with her daughter. The MD will work on getting her medication reviewed and changed as needed.

## 2018-10-15 NOTE — Progress Notes (Signed)
Recreation Therapy Notes  Date: 10/15/2018  Time: 9:30 am   Location: Craft room   Behavioral response: N/A   Intervention Topic: Stress  Discussion/Intervention: Patient did not attend group.   Clinical Observations/Feedback:  Patient did not attend group.   Esker Dever LRT/CTRS        Nazaire Cordial 10/15/2018 12:23 PM

## 2018-10-15 NOTE — Tx Team (Addendum)
Interdisciplinary Treatment and Diagnostic Plan Update  10/15/2018 Time of Session: 11am [name] MRN: [id number]  Principal Diagnosis: <principal problem not specified>  Secondary Diagnoses: Active Problems:   Bipolar 1 disorder, manic, full remission (HCC)   Bipolar disorder with severe mania ([name])   Current Medications:  Current Facility-Administered Medications  Medication Dose Route Frequency Provider Last Rate Last Dose  . acetaminophen (TYLENOL) tablet 650 mg  650 mg Oral Q6H PRN [name], [name], MD   650 mg at 10/13/18 4098  . alum & mag hydroxide-simeth (MAALOX/MYLANTA) 200-200-20 MG/5ML suspension 30 mL  30 mL Oral Q4H PRN [name], [name], MD      . benzocaine (ORAJEL) 10 % mucosal gel   Mouth/Throat QID PRN Lenward Chancellor, MD      . buPROPion [facility]) tablet 75 mg  75 mg Oral Daily Lenward Chancellor, MD   75 mg at 10/15/18 0818  . chlorhexidine (PERIDEX) 0.12 % solution 15 mL  15 mL Mouth/Throat BID Lenward Chancellor, MD   15 mL at 10/15/18 0819  . cloZAPine (CLOZARIL) tablet 12.5 mg  12.5 mg Oral QHS McNew, Holly R, MD      . fluticasone (FLONASE) 50 MCG/ACT nasal spray 1 spray  1 spray Each Nare BID PRN Lenward Chancellor, MD   1 spray at 10/15/18 0819  . hydrOXYzine (ATARAX/VISTARIL) tablet 50 mg  50 mg Oral TID PRN [name], [name], MD   50 mg at 10/14/18 0444  . lamoTRIgine (LAMICTAL) tablet 50 mg  50 mg Oral Daily Lenward Chancellor, MD   50 mg at 10/15/18 0818  . levothyroxine (SYNTHROID, LEVOTHROID) tablet 88 mcg  88 mcg Oral QAC breakfast [name], [name], MD   88 mcg at 10/15/18 0818  . magnesium hydroxide (MILK OF MAGNESIA) suspension 30 mL  30 mL Oral Daily PRN [name], [name], MD   30 mL at 10/14/18 1415  . ondansetron (ZOFRAN) tablet 4 mg  4 mg Oral TID PRN Lenward Chancellor, MD      . oxyCODONE-acetaminophen (PERCOCET/ROXICET) 5-325 MG per tablet 1 tablet  1 tablet Oral Q6H PRN [name], [name], MD   1 tablet at 10/15/18 0818  . pantoprazole  (PROTONIX) EC tablet 40 mg  40 mg Oral Daily [name], [name], MD   40 mg at 10/15/18 0819  . temazepam (RESTORIL) capsule 15 mg  15 mg Oral QHS PRN [name], [name], MD   15 mg at 10/13/18 2136  . tiZANidine (ZANAFLEX) tablet 4 mg  4 mg Oral TID PRN Lenward Chancellor, MD   4 mg at 10/15/18 0520  . valACYclovir (VALTREX) tablet 500 mg  500 mg Oral Daily Lenward Chancellor, MD   500 mg at 10/15/18 0818   PTA Medications: Medications Prior to Admission  Medication Sig Dispense Refill Last Dose  . albuterol (PROVENTIL HFA;VENTOLIN HFA) 108 (90 Base) MCG/ACT inhaler Inhale 2 puffs into the lungs every 6 (six) hours as needed for wheezing or shortness of breath. 1 Inhaler 0 Unknown at PRN  . ascorbic acid (VITAMIN C) 500 MG tablet Take 500 mg by mouth daily.   Unknown at Unknown  . Biotin w/ Vitamins C & E (HAIR SKIN & NAILS GUMMIES) 1250-7.5-7.5 MCG-MG-UNT CHEW Chew by mouth daily.   Unknown at Unknown  . carbamazepine (TEGRETOL) 200 MG tablet Take 1 tablet (200 mg total) by mouth every 12 (twelve) hours. 60 tablet 1 Past Week at Unknown time  . cholecalciferol (VITAMIN D) 1000 units tablet Take 1 tablet (1,000  Units total) by mouth daily.   Unknown at Unknown  . hydrOXYzine (ATARAX/VISTARIL) 50 MG tablet Take 1 tablet (50 mg total) by mouth 3 (three) times daily as needed for anxiety. (Patient not taking: Reported on 10/11/2018) 90 tablet 1 Not Taking at Unknown time  . hydrOXYzine (VISTARIL) 25 MG capsule Take 25 mg by mouth every 6 (six) hours as needed for anxiety or itching.   Unknown at PRN  . lamoTRIgine (LAMICTAL) 25 MG tablet Take 50 mg by mouth 2 (two) times daily.  0 Past Week at unknown  . levothyroxine (SYNTHROID, LEVOTHROID) 88 MCG tablet TAKE 1 TABLET(88 MCG) BY MOUTH DAILY 90 tablet 1 Past Week at Unknown  . magic mouthwash SOLN Take 5 mLs by mouth 3 (three) times daily as needed for mouth pain. (Patient not taking: Reported on 10/12/2018) 200 mL 0 Not Taking at Unknown time  . Multiple  Vitamin (MULTIVITAMIN) tablet Take 1 tablet by mouth daily.     Unknown at Unknown  . Omega-3 Fatty Acids (FISH OIL) 1000 MG CAPS Take 1 capsule (1,000 mg total) by mouth daily.   Unknown at Unknown  . omeprazole (PRILOSEC) 20 MG capsule TAKE 1 CAPSULE BY MOUTH EVERY MORNING 45 MINUTES BEFORE BREAKFAST (Patient taking differently: Take 20 mg by mouth daily. TAKE 1 CAPSULE BY MOUTH EVERY MORNING 45 MINUTES BEFORE BREAKFAST) 90 capsule 0 unknown at unknown  . ondansetron (ZOFRAN-ODT) 4 MG disintegrating tablet DISSOLVE 1 TABLET BY MOUTH AS NEEDED (Patient taking differently: Take 4 mg by mouth 3 (three) times daily as needed for nausea or vomiting. ) 20 tablet 0 Past Week at PRN  . OVER THE COUNTER MEDICATION [place] daily for menopausal symptoms.   Unknown at Unknown  . Oxybutynin Chloride (GELNIQUE) 10 % GEL Place onto the skin daily.     Unknown at Unknown  . oxyCODONE (ROXICODONE) 5 MG immediate release tablet Take 1 tablet (5 mg total) by mouth every 8 (eight) hours as needed. (Patient taking differently: Take 5 mg by mouth every 6 (six) hours as needed. ) 5 tablet 0 prn at prn  . paliperidone (INVEGA) 6 MG 24 hr tablet Take 6 mg by mouth at bedtime.   Past Week at Unknown time  . risperiDONE (RISPERDAL) 3 MG tablet Take 0.5 tablets (1.5 mg total) by mouth at bedtime.   Past Week at Unknown time  . temazepam (RESTORIL) 15 MG capsule Take 1 capsule (15 mg total) by mouth at bedtime. 30 capsule 0 Past Week at Unknown time  . tiZANidine (ZANAFLEX) 4 MG tablet Take 1 tablet (4 mg total) by mouth 3 (three) times daily. 30 tablet 0 unknown at unknown  . valACYclovir (VALTREX) 500 MG tablet Take 500 mg by mouth daily.    Past Week at Unknown  . vitamin A (VITAMIN A) 10000 UNIT capsule Take 10,000 Units by mouth daily.   Unknown at Unknown  . vitamin B-12 (CYANOCOBALAMIN) 1000 MCG tablet Take 1,000 mcg by mouth daily.   Unknown at Unknown  . vitamin E 200 UNIT capsule Take 200 Units by mouth daily.      Unknown at Unknown    Patient Stressors: Health problems Medication change or noncompliance Traumatic event  Patient Strengths: Average or above average intelligence Capable of independent living Communication skills Supportive family/friends  Treatment Modalities: Medication Management, Group therapy, Case management,  1 to 1 session with clinician, Psychoeducation, Recreational therapy.   Physician Treatment Plan for Primary Diagnosis: <principal problem not specified> Long Term  Goal(s): Improvement in symptoms so as ready for discharge Improvement in symptoms so as ready for discharge   Short Term Goals: Ability to identify changes in lifestyle to reduce recurrence of condition will improve Ability to verbalize feelings will improve Ability to disclose and discuss suicidal ideas Ability to demonstrate self-control will improve Ability to identify and develop effective coping behaviors will improve Ability to maintain clinical measurements within normal limits will improve Compliance with prescribed medications will improve Ability to identify triggers associated with substance abuse/mental health issues will improve Ability to identify changes in lifestyle to reduce recurrence of condition will improve Ability to verbalize feelings will improve Ability to disclose and discuss suicidal ideas Ability to demonstrate self-control will improve Ability to identify and develop effective coping behaviors will improve Ability to maintain clinical measurements within normal limits will improve Compliance with prescribed medications will improve Ability to identify triggers associated with substance abuse/mental health issues will improve  Medication Management: Evaluate patient's response, side effects, and tolerance of medication regimen.  Therapeutic Interventions: 1 to 1 sessions, Unit Group sessions and Medication administration.  Evaluation of Outcomes: Progressing  Physician  Treatment Plan for Secondary Diagnosis: Active Problems:   Bipolar 1 disorder, manic, full remission ([place])   Bipolar disorder with severe mania ([place])  Long Term Goal(s): Improvement in symptoms so as ready for discharge Improvement in symptoms so as ready for discharge   Short Term Goals: Ability to identify changes in lifestyle to reduce recurrence of condition will improve Ability to verbalize feelings will improve Ability to disclose and discuss suicidal ideas Ability to demonstrate self-control will improve Ability to identify and develop effective coping behaviors will improve Ability to maintain clinical measurements within normal limits will improve Compliance with prescribed medications will improve Ability to identify triggers associated with substance abuse/mental health issues will improve Ability to identify changes in lifestyle to reduce recurrence of condition will improve Ability to verbalize feelings will improve Ability to disclose and discuss suicidal ideas Ability to demonstrate self-control will improve Ability to identify and develop effective coping behaviors will improve Ability to maintain clinical measurements within normal limits will improve Compliance with prescribed medications will improve Ability to identify triggers associated with substance abuse/mental health issues will improve     Medication Management: Evaluate patient's response, side effects, and tolerance of medication regimen.  Therapeutic Interventions: 1 to 1 sessions, Unit Group sessions and Medication administration.  Evaluation of Outcomes: Progressing   RN Treatment Plan for Primary Diagnosis: <principal problem not specified> Long Term Goal(s): Knowledge of disease and therapeutic regimen to maintain health will improve  Short Term Goals: Ability to remain free from injury will improve, Ability to demonstrate self-control, Ability to identify and develop effective coping behaviors will  improve and Compliance with prescribed medications will improve  Medication Management: RN will administer medications as ordered by provider, will assess and evaluate patient's response and provide education to patient for prescribed medication. RN will report any adverse and/or side effects to prescribing provider.  Therapeutic Interventions: 1 on 1 counseling sessions, Psychoeducation, Medication administration, Evaluate responses to treatment, Monitor vital signs and CBGs as ordered, Perform/monitor CIWA, COWS, AIMS and Fall Risk screenings as ordered, Perform wound care treatments as ordered.  Evaluation of Outcomes: Progressing   LCSW Treatment Plan for Primary Diagnosis: <principal problem not specified> Long Term Goal(s): Safe transition to appropriate next level of care at discharge, Engage patient in therapeutic group addressing interpersonal concerns.  Short Term Goals: Engage patient in aftercare planning  with referrals and resources, Increase social support, Increase ability to appropriately verbalize feelings, Increase emotional regulation, Identify triggers associated with mental health/substance abuse issues and Increase skills for wellness and recovery  Therapeutic Interventions: Assess for all discharge needs, 1 to 1 time with Social worker, Explore available resources and support systems, Assess for adequacy in community support network, Educate family and significant other(s) on suicide prevention, Complete Psychosocial Assessment, Interpersonal group therapy.  Evaluation of Outcomes: Progressing   Progress in Treatment: Attending groups: Yes. Participating in groups: Yes. Taking medication as prescribed: Yes. Toleration medication: Yes. Family/Significant other contact made: No, will contact:   Marylou Flesher Palmer/daughter/[phone] Patient understands diagnosis: Yes. Discussing patient identified problems/goals with staff: Yes. Medical problems stabilized or resolved:  Yes. Denies suicidal/homicidal ideation: Yes. Issues/concerns per patient self-inventory: No. Other:   New problem(s) identified: No, Describe:  None  New Short Term/Long Term Goal(s): "To get back on medications and not use alcohol or THC again."  Patient Goals:  "To get back on medications and not use alcohol or THC again."  Discharge Plan or Barriers: To be discharged home and follow up with outpatient provider.  Reason for Continuation of Hospitalization: Medication stabilization  Estimated Length of Stay: 3-5 days   Recreational Therapy: Patient Stressors: N/A Patient Goal: Patient will engage in groups without prompting or encouragement from LRT x3 group sessions within 5 recreation therapy group sessions  Attendees: Patient: Leslie Wong 10/15/2018 11:48 AM  Physician: Earnest Bailey McNew,MD 10/15/2018 11:48 AM  Nursing:  10/15/2018 11:48 AM  RN Care Manager: 10/15/2018 11:48 AM  Social Worker: Darin Engels, [place] 10/15/2018 11:48 AM  Recreational Therapist: Isaias Sakai. Marcello Fennel, LRT 10/15/2018 11:48 AM  Other: Sanjuana Kava, LCSW 10/15/2018 11:48 AM  Other: Marney Doctor, [place] 10/15/2018 11:48 AM  Other: 10/15/2018 11:48 AM    Scribe for Treatment Team: Darin Engels, LCSW 10/15/2018 11:48 AM

## 2018-10-15 NOTE — Progress Notes (Signed)
Recreation Therapy Notes  INPATIENT RECREATION THERAPY ASSESSMENT  Patient Details Name: Amani Marseille MRN: [id number] DOB: 1956-08-22 Today's Date: 10/15/2018       Information Obtained From: Patient  Able to Participate in Assessment/Interview: Yes  Patient Presentation: Responsive  Reason for Admission (Per Patient): Active Symptoms, Med Non-Compliance, Substance Abuse(I smoked some laced weed)  Patient Stressors:    Coping Skills:   Talk, Exercise  Leisure Interests (2+):  Social - Family, Social - Friends, Exercise - Walking  Frequency of Recreation/Participation: Biomedical engineer of Community Resources:  Yes  Community Resources:  Park  Current Use: Yes  If no, Barriers?:    Expressed Interest in [place] of Residence:  Insurance underwriter  Patient Main Form of Transportation: Musician  Patient Strengths:  Kind,sweet, helpful  Patient Identified Areas of Improvement:  Look out for myself  Patient Goal for Hospitalization:  Get medication back on track  Current SI (including self-harm):  No  Current HI:  No  Current AVH: No  Staff Intervention Plan: Group Attendance, Collaborate with Interdisciplinary Treatment Team  Consent to Intern Participation: N/A  Yareliz Thorstenson 10/15/2018, 4:12 PM

## 2018-10-15 NOTE — [facility] Group Notes (Signed)
[place] Group Notes:  (Nursing/MHT/Case Management/Adjunct)  Date:  10/15/2018  Time:  11:23 PM  Type of Therapy:  Group Therapy  Participation Level:  Active  Participation Quality:  Appropriate  Affect:  Appropriate  Cognitive:  Appropriate  Insight:  Appropriate  Engagement in Group:  Engaged  Modes of Intervention:  Discussion  Summary of Progress/Problems:  Kandis Fantasia 10/15/2018, 11:23 PM

## 2018-10-15 NOTE — Plan of Care (Signed)
Cooperative in the milieu. Anxious and restless secondary to ongoing back/leg pain. Medicated as scheduled.

## 2018-10-15 NOTE — Plan of Care (Signed)
Patient alert and oriented x 4. Patient present in the milieu, complained of increased back pain this morning to the point that she needed to be rolled to the dayroom in a wheelchair to eat breakfast. By the afternoon patient ambulating the unit without any assistive devices. Complained of a cold sore that has come up on her lip overnight, received an order for Blistex to apply Bid. Denies SI/HI/AVH and pain at this time. Milieu remains safe with q 15 minute safety checks.

## 2018-10-15 NOTE — Progress Notes (Addendum)
[facility] MD Progress Note  10/15/2018 12:39 PM Leslie Wong  MRN:  [id number] Subjective:  Per RN staff, she can be intrusive and coming to the nurses station many times a day with multiple complaints. Upon evaluation, today she is in bed resting. She seems much calmer that on admission. She states that her neck hurts from a pinched nerve and wants to get surgery on it. She states that she feels like she slept better last night. She states that she has not been sleeping well at home because of the black mold. She states that she ended up back in the hospital because "I smoked 2 hits of pt that was laced with formaldehyde and fentanyl. I'm not smoking that anymore." She denies SI or HI. She denies AH, VH.   With patient's verbal permission, I spoke with her daughter< [place] who is supportive and a good source of collateral. She states that pt was recently discharged twice from inpatient admission and was not any better. She states that her mother does not sleep for days at a time. She states that she has a lot on her plate and makes it difficult o meet all the needs of her mother. She states that her mother calls her many times a day with various complaints. She is "overly dramatic and calls the police a lot. "She is focused on nerve pain a lot and is focused on seeing a neurosurgeon. . She states that pt is overly focused on black mold in the house which was actually confirmed in the apartment. She states that pt reports seeing "people on the other side' and sometimes talks bout seeing her family members who have passed away. Her daughter states that she has been diagnosed with Parkinson's disease and wonders if her memory is declining due to this. Discussed that this could be however, some of the symptosm appear related to mania and bipolar disorder and it is important to get this under control to see what her baseline cognition is. Discussed that we could try to get pt some assistance at home with home  health to take some of the burden off of [place]. Her daughter states that pt has done well on Wellbutrin in the past for many years but was taken off of it after a DAT scan and was never put back on it. Discussed that given her diagnosis of parkinson's disease that Risperdal is not the best option as it can worsen symptoms of it. Discussed a trial of Clozapine to help with psychotic symptosm and mood stabilization> Discussed that she will need CBC monitoring with this. [name] agrees with this option.   I spoke to her outpatient provider, Georgette Dover. She has only seen patient once and when she came in for the visit was floridly psychotic and sent her to the ED. I also discussed the option of Clozapine with her given her diagnosis of PD and she is in agreement with this and will have her get her blood draws right in her office.   Principal Problem: Bipolar disorder with severe mania ([place]) Diagnosis:   Patient Active Problem List   Diagnosis Date Noted  . Bipolar disorder with severe mania ([place]) [F31.13]     Priority: High  . Mood change [R45.86] 10/14/2018  . Bipolar I disorder, current or most recent episode manic, with psychotic features ([place]) [F31.2] 08/28/2018  . Itch of skin [L29.9] 08/15/2018  . Oral ulcer [K12.1] 08/15/2018  . Night sweats [R61] 05/13/2018  . Pupil asymmetry [H57.02]  05/13/2018  . Hx of cold sores [Z86.19] 03/14/2018  . Parkinson disease ([place]) [G20]   . Health care maintenance [Z00.00] 06/23/2017  . HLD (hyperlipidemia) [E78.5] 03/10/2016  . Advance care planning [Z71.89] 03/10/2016  . Generalized abdominal pain [R10.84] 02/18/2015  . Osteopenia [M85.80] 04/18/2014  . Medicare annual wellness visit, initial [Z00.00] 03/18/2014  . Shoulder pain [M25.519] 01/17/2013  . Chronic back pain [M54.9, G89.29] 09/14/2011  . Hypercalcemia [E83.52] 03/25/2010  . NAUSEA WITH VOMITING [R11.2] 05/12/2009  . INCONTINENCE, URGE [N39.41] 04/16/2009  . CARPAL TUNNEL SYNDROME,  BILATERAL [G56.00] 09/24/2008  . Tongue irritation [K14.9] 09/24/2008  . URETHRAL STRICTURE [N35.919] 06/13/2007  . Hypothyroidism [E03.9] 06/05/2007  . DISORDER, BIPOLAR NOS [F31.9] 06/05/2007  . MIGRAINE HEADACHE [G43.909] 06/05/2007  . GASTROPARESIS [K31.84] 06/05/2007  . Gastroesophageal reflux disease with hiatal hernia [K21.9, K44.9] 06/05/2007  . IRRITABLE BOWEL SYNDROME [K58.9] 06/05/2007  . FIBROCYSTIC BREAST DISEASE [N60.19] 06/05/2007   Total Time spent with patient: 45 minutes  Past Psychiatric History: See H&p Past medication trials including Abilify (worsened Parkinson's symptoms), Lithium Depakote, Seroquel  History of Parkinson's disease-Per Neurology note, "DIP and IPD can be hard to distinguish clinically, but the asymmetry is concerning. I offered a diagnostic and therapeutic trial of levodopa, but daughter would like to minimize her medication regimen if possible. In that case, we can obtain a DAT scan prior to any medication changes. DAT scan "There is symmetric distribution of radiotracer in the bilateral caudate nuclei and mild decreased uptake of radiotracer in the left putamen as compared to the right putamen."   Past Medical History:  Past Medical History:  Diagnosis Date  . Back pain   . Cancer ([place])    skin  . Depression    BAD  . Gastroparesis   . GERD (gastroesophageal reflux disease)   . Hypothyroidism   . Insomnia   . Migraines   . Parkinson disease ([place])    per [facility], dx'd 2019  . Recurrent cold sores   . Shoulder bursitis   . Urge incontinence     Past Surgical History:  Procedure Laterality Date  . ABDOMINAL HYSTERECTOMY  1999   total  . APPENDECTOMY    . ESOPHAGOGASTRODUODENOSCOPY  01/2006   negative except small hiatal hernia  . ESOPHAGOGASTRODUODENOSCOPY  02/24/2015   See report  . LAPAROSCOPY     for endometriosis  . OVARIAN CYST REMOVAL  1990   unilateral  . TONSILLECTOMY AND ADENOIDECTOMY     Family History:   Family History  Problem Relation Age of Onset  . Hypertension Mother   . Arthritis Mother   . Diabetes Mother   . Stroke Mother   . Cancer Father        lung  . Colon cancer Neg Hx   . Breast cancer Neg Hx    Family Psychiatric  History: See H&P Social History:  Social History   Substance and Sexual Activity  Alcohol Use No  . Alcohol/week: 0.0 standard drinks     Social History   Substance and Sexual Activity  Drug Use No    Social History   Socioeconomic History  . Marital status: Divorced    Spouse name: Not on file  . Number of children: Not on file  . Years of education: Not on file  . Highest education level: Not on file  Occupational History  . Not on file  Social Needs  . Financial resource strain: Not on file  . Food insecurity:  Worry: Not on file    Inability: Not on file  . Transportation needs:    Medical: Not on file    Non-medical: Not on file  Tobacco Use  . Smoking status: Former Smoker    Packs/day: 1.00    Years: 42.00    Pack years: 42.00    Types: Cigarettes  . Smokeless tobacco: Former Network engineer and Sexual Activity  . Alcohol use: No    Alcohol/week: 0.0 standard drinks  . Drug use: No  . Sexual activity: Never  Lifestyle  . Physical activity:    Days per week: Not on file    Minutes per session: Not on file  . Stress: Not on file  Relationships  . Social connections:    Talks on phone: Not on file    Gets together: Not on file    Attends religious service: Not on file    Active member of club or organization: Not on file    Attends meetings of clubs or organizations: Not on file    Relationship status: Not on file  Other Topics Concern  . Not on file  Social History Narrative   Lives alone.     Has a Counsellor   Additional Social History:                         Sleep: Fair  Appetite:  Fair  Current Medications: Current Facility-Administered Medications  Medication Dose Route Frequency  Provider Last Rate Last Dose  . acetaminophen (TYLENOL) tablet 650 mg  650 mg Oral Q6H PRN [name], [name], MD   650 mg at 10/13/18 6948  . alum & mag hydroxide-simeth (MAALOX/MYLANTA) 200-200-20 MG/5ML suspension 30 mL  30 mL Oral Q4H PRN [name], [name], MD      . benzocaine (ORAJEL) 10 % mucosal gel   Mouth/Throat QID PRN Lenward Chancellor, MD      . buPROPion [facility]) tablet 75 mg  75 mg Oral Daily Lenward Chancellor, MD   75 mg at 10/15/18 0818  . chlorhexidine (PERIDEX) 0.12 % solution 15 mL  15 mL Mouth/Throat BID Lenward Chancellor, MD   15 mL at 10/15/18 0819  . cloZAPine (CLOZARIL) tablet 12.5 mg  12.5 mg Oral QHS Jeyson Deshotel R, MD      . fluticasone (FLONASE) 50 MCG/ACT nasal spray 1 spray  1 spray Each Nare BID PRN Lenward Chancellor, MD   1 spray at 10/15/18 0819  . hydrOXYzine (ATARAX/VISTARIL) tablet 50 mg  50 mg Oral TID PRN [name], [name], MD   50 mg at 10/14/18 0444  . lamoTRIgine (LAMICTAL) tablet 50 mg  50 mg Oral Daily Lenward Chancellor, MD   50 mg at 10/15/18 0818  . levothyroxine (SYNTHROID, LEVOTHROID) tablet 88 mcg  88 mcg Oral QAC breakfast [name], [name], MD   88 mcg at 10/15/18 0818  . magnesium hydroxide (MILK OF MAGNESIA) suspension 30 mL  30 mL Oral Daily PRN [name], [name], MD   30 mL at 10/14/18 1415  . ondansetron (ZOFRAN) tablet 4 mg  4 mg Oral TID PRN Lenward Chancellor, MD      . oxyCODONE-acetaminophen (PERCOCET/ROXICET) 5-325 MG per tablet 1 tablet  1 tablet Oral Q6H PRN [name], [name], MD   1 tablet at 10/15/18 0818  . pantoprazole (PROTONIX) EC tablet 40 mg  40 mg Oral Daily [name], [name], MD   40 mg at 10/15/18 0819  . temazepam (RESTORIL)  capsule 15 mg  15 mg Oral QHS PRN [name], [name], MD   15 mg at 10/13/18 2136  . tiZANidine (ZANAFLEX) tablet 4 mg  4 mg Oral TID PRN Lenward Chancellor, MD   4 mg at 10/15/18 0520  . valACYclovir (VALTREX) tablet 500 mg  500 mg Oral Daily Lenward Chancellor, MD   500 mg at 10/15/18 0818    Lab Results: No results  found for this or any previous visit (from the past 26 hour(s)).  Blood Alcohol level:  Lab Results  Component Value Date   ETH 45 (H) 10/11/2018   ETH <10 31/51/7616    Metabolic Disorder Labs: Lab Results  Component Value Date   HGBA1C 5.4 10/13/2018   MPG 108.28 10/13/2018   MPG 111.15 08/29/2018   No results found for: PROLACTIN Lab Results  Component Value Date   CHOL 168 10/13/2018   TRIG 130 10/13/2018   HDL 39 (L) 10/13/2018   CHOLHDL 4.3 10/13/2018   VLDL 26 10/13/2018   LDLCALC 103 (H) 10/13/2018   LDLCALC 107 (H) 08/29/2018    Physical Findings: AIMS:  , ,  ,  ,    CIWA:    COWS:     Musculoskeletal: Strength & Muscle Tone: within normal limits Gait & Station: Unsteady. Uses wheelchair Patient leans: N/A  Psychiatric Specialty Exam: Physical Exam  Nursing note and vitals reviewed.   Review of Systems  Musculoskeletal: Positive for neck pain.  Neurological: Positive for tremors and weakness.  All other systems reviewed and are negative.   Blood pressure 96/68, pulse 74, temperature 98.9 F (37.2 C), temperature source Oral, resp. rate 16, height 4\' 11"  (1.499 m), weight 48.5 kg, SpO2 97 %.Body mass index is 21.61 kg/m.  General Appearance: Casual  Eye Contact:  Good  Speech:  Clear and Coherent  Volume:  Normal  Mood:  Euthymic  Affect:  Labile  Thought Process:  Coherent and Goal Directed  Orientation:  Full (Time, Place, and Person)  Thought Content:  Delusions  Suicidal Thoughts:  No  Homicidal Thoughts:  No  Memory:  Immediate;   Fair  Judgement:  Impaired  Insight:  Fair  Psychomotor Activity:  Normal  Concentration:  Concentration: Fair and Attention Span: Fair  Recall:  Good  Fund of Knowledge:  Fair  Language:  Fair  Akathisia:  No      Assets:  Resilience  ADL's:  Intact  Cognition:  WNL  Sleep:  Number of Hours: 6.15     Treatment Plan Summary: 62 yo female admitted due to mania and psychosis. She is calmer today but  still delusional. She has had several hospitalizations recently due to acute mania and psychosis. She does have confirmed diagnosis of Parkinson's disease (I did thorough chart review including neurology notes). This was confirmed via DAT scan. She has multiple allergies to many mood stabilization. Most Anti-psychotics are to be avoided in Parkinson's disease due to potential to worsen PD symptoms. Seroquel or Clozapine can be used. She has allergy to Seroquel. She would be good candidate for a trial of Clozapine to help stabilize psychosis and mood that has led to multiple hospitalizations. Patient, her daughter and outpatient provider all agree to this.   Plan:  Bipolar I disorder -Start Clozapine 12.5 mg qhs -Will check CBC today -QTc 426 -Stop Risperdal due to history of Parkinson's disease -Pt and daughter state that she did well on Wellbutrin in the past. Pt is adamant that she wants to be on  this. She was started on low dose of 75 mg over the weekend. Will switch to XR 150 mg daily. This can also mildly help with PD symptoms but do not want to increase much as can worsen psychosis -Continue Lamictal 50 mg daily for mood stabilization -Stop Temazepam and Ativan as she is also on opiates -Add trazodone prn for sleep   Parkinson's disease -Confirmed with DAT scan -She follows with Dr. Sharolyn Douglas with [facility]  Chronic Pain -She follows with [facility].  -Percocet Q6hrs prn  Unsteady Gait -Will order PT consult to determine if she would benefit from home PT  Anxiety -prn hydroxyzine  Dispo -She follows with [facility] for mental health. Will have CSW make [facility] referral to offer more help and support at home. I spoke with her daughter [name] today and her outpatient provider Georgette Dover.      Marylin Crosby, MD 10/15/2018, 12:39 PM

## 2018-10-15 NOTE — Progress Notes (Signed)
Patient stayed in the milieu, pleasant and cooperative but complaining of ongoing back/leg pain. Frequently seeking to talk to staff. Alert and oriented and able to express needs appropriately. Patient receiving medications as scheduled and emotional support provided. No additional concerns. Currently in bed  And staff continue to monitor for safety.

## 2018-10-16 MED ORDER — ENSURE ENLIVE PO LIQD
237.0000 mL | Freq: Two times a day (BID) | ORAL | Status: DC
  Administered 2018-10-17 – 2018-10-21 (×8): 237 mL via ORAL

## 2018-10-16 MED ORDER — CLOZAPINE 25 MG PO TABS
25.0000 mg | ORAL_TABLET | Freq: Every day | ORAL | Status: DC
  Administered 2018-10-16: 25 mg via ORAL
  Filled 2018-10-16: qty 1

## 2018-10-16 NOTE — Progress Notes (Signed)
[facility] MD Progress Note  10/16/2018 12:30 PM Leslie Wong  MRN:  [id number] Subjective:  Pt continues to be very somatic with many pain complaints. She is calm upon interview today. She states that she is "sweating out black mold." She is less hyperverbal and more organized in her thoughts. She denies SI, HI, Ah, Vh. She reports sleeping fine last night. So far has tolerated initiation of Clozapine.   Principal Problem: Bipolar disorder with severe mania ([place]) Diagnosis:   Patient Active Problem List   Diagnosis Date Noted  . Bipolar disorder with severe mania ([place]) [F31.13]     Priority: High  . Mood change [R45.86] 10/14/2018  . Bipolar I disorder, current or most recent episode manic, with psychotic features ([place]) [F31.2] 08/28/2018  . Itch of skin [L29.9] 08/15/2018  . Oral ulcer [K12.1] 08/15/2018  . Night sweats [R61] 05/13/2018  . Pupil asymmetry [H57.02] 05/13/2018  . Hx of cold sores [Z86.19] 03/14/2018  . Parkinson disease ([place]) [G20]   . Health care maintenance [Z00.00] 06/23/2017  . HLD (hyperlipidemia) [E78.5] 03/10/2016  . Advance care planning [Z71.89] 03/10/2016  . Generalized abdominal pain [R10.84] 02/18/2015  . Osteopenia [M85.80] 04/18/2014  . Medicare annual wellness visit, initial [Z00.00] 03/18/2014  . Shoulder pain [M25.519] 01/17/2013  . Chronic back pain [M54.9, G89.29] 09/14/2011  . Hypercalcemia [E83.52] 03/25/2010  . NAUSEA WITH VOMITING [R11.2] 05/12/2009  . INCONTINENCE, URGE [N39.41] 04/16/2009  . CARPAL TUNNEL SYNDROME, BILATERAL [G56.00] 09/24/2008  . Tongue irritation [K14.9] 09/24/2008  . URETHRAL STRICTURE [N35.919] 06/13/2007  . Hypothyroidism [E03.9] 06/05/2007  . DISORDER, BIPOLAR NOS [F31.9] 06/05/2007  . MIGRAINE HEADACHE [G43.909] 06/05/2007  . GASTROPARESIS [K31.84] 06/05/2007  . Gastroesophageal reflux disease with hiatal hernia [K21.9, K44.9] 06/05/2007  . IRRITABLE BOWEL SYNDROME [K58.9] 06/05/2007  . FIBROCYSTIC BREAST  DISEASE [N60.19] 06/05/2007   Total Time spent with patient: 20 minutes  Past Psychiatric History: See H&P  Past Medical History:  Past Medical History:  Diagnosis Date  . Back pain   . Cancer ([place])    skin  . Depression    BAD  . Gastroparesis   . GERD (gastroesophageal reflux disease)   . Hypothyroidism   . Insomnia   . Migraines   . Parkinson disease ([place])    per [facility], dx'd 2019  . Recurrent cold sores   . Shoulder bursitis   . Urge incontinence     Past Surgical History:  Procedure Laterality Date  . ABDOMINAL HYSTERECTOMY  1999   total  . APPENDECTOMY    . ESOPHAGOGASTRODUODENOSCOPY  01/2006   negative except small hiatal hernia  . ESOPHAGOGASTRODUODENOSCOPY  02/24/2015   See report  . LAPAROSCOPY     for endometriosis  . OVARIAN CYST REMOVAL  1990   unilateral  . TONSILLECTOMY AND ADENOIDECTOMY     Family History:  Family History  Problem Relation Age of Onset  . Hypertension Mother   . Arthritis Mother   . Diabetes Mother   . Stroke Mother   . Cancer Father        lung  . Colon cancer Neg Hx   . Breast cancer Neg Hx    Family Psychiatric  History: See H&P Social History:  Social History   Substance and Sexual Activity  Alcohol Use No  . Alcohol/week: 0.0 standard drinks     Social History   Substance and Sexual Activity  Drug Use No    Social History   Socioeconomic History  . Marital status: Divorced  Spouse name: Not on file  . Number of children: Not on file  . Years of education: Not on file  . Highest education level: Not on file  Occupational History  . Not on file  Social Needs  . Financial resource strain: Not on file  . Food insecurity:    Worry: Not on file    Inability: Not on file  . Transportation needs:    Medical: Not on file    Non-medical: Not on file  Tobacco Use  . Smoking status: Former Smoker    Packs/day: 1.00    Years: 42.00    Pack years: 42.00    Types: Cigarettes  . Smokeless tobacco:  Former Network engineer and Sexual Activity  . Alcohol use: No    Alcohol/week: 0.0 standard drinks  . Drug use: No  . Sexual activity: Never  Lifestyle  . Physical activity:    Days per week: Not on file    Minutes per session: Not on file  . Stress: Not on file  Relationships  . Social connections:    Talks on phone: Not on file    Gets together: Not on file    Attends religious service: Not on file    Active member of club or organization: Not on file    Attends meetings of clubs or organizations: Not on file    Relationship status: Not on file  Other Topics Concern  . Not on file  Social History Narrative   Lives alone.     Has a Counsellor   Additional Social History:                         Sleep: Fair  Appetite:  Fair  Current Medications: Current Facility-Administered Medications  Medication Dose Route Frequency Provider Last Rate Last Dose  . acetaminophen (TYLENOL) tablet 650 mg  650 mg Oral Q6H PRN [name], [name], MD   650 mg at 10/13/18 9678  . alum & mag hydroxide-simeth (MAALOX/MYLANTA) 200-200-20 MG/5ML suspension 30 mL  30 mL Oral Q4H PRN [name], [name], MD      . benzocaine (ORAJEL) 10 % mucosal gel   Mouth/Throat QID PRN Lenward Chancellor, MD      . buPROPion (WELLBUTRIN XL) 24 hr tablet 150 mg  150 mg Oral Daily Braxson Hollingsworth, Tyson Babinski, MD   150 mg at 10/16/18 0851  . chlorhexidine (PERIDEX) 0.12 % solution 15 mL  15 mL Mouth/Throat BID Lenward Chancellor, MD   15 mL at 10/16/18 0848  . cloZAPine (CLOZARIL) tablet 25 mg  25 mg Oral QHS Raegan Winders R, MD      . fluticasone (FLONASE) 50 MCG/ACT nasal spray 1 spray  1 spray Each Nare BID PRN Lenward Chancellor, MD   1 spray at 10/15/18 0819  . hydrOXYzine (ATARAX/VISTARIL) tablet 50 mg  50 mg Oral TID PRN [name], [name], MD   50 mg at 10/15/18 1312  . lamoTRIgine (LAMICTAL) tablet 50 mg  50 mg Oral Daily Lenward Chancellor, MD   50 mg at 10/16/18 0850  . levothyroxine (SYNTHROID, LEVOTHROID) tablet 88  mcg  88 mcg Oral QAC breakfast [name], [name], MD   88 mcg at 10/16/18 0851  . lip balm (BLISTEX) ointment   Topical PRN Jessenya Berdan R, MD      . magnesium hydroxide (MILK OF MAGNESIA) suspension 30 mL  30 mL Oral Daily PRN [name], [name], MD   30 mL  at 10/14/18 1415  . ondansetron (ZOFRAN) tablet 4 mg  4 mg Oral TID PRN Lenward Chancellor, MD   4 mg at 10/16/18 0852  . oxyCODONE-acetaminophen (PERCOCET/ROXICET) 5-325 MG per tablet 1 tablet  1 tablet Oral Q6H PRN [name], [name], MD   1 tablet at 10/16/18 0156  . pantoprazole (PROTONIX) EC tablet 40 mg  40 mg Oral Daily [name], [name], MD   40 mg at 10/16/18 0851  . tiZANidine (ZANAFLEX) tablet 4 mg  4 mg Oral TID PRN Lenward Chancellor, MD   4 mg at 10/16/18 0848  . traZODone (DESYREL) tablet 50 mg  50 mg Oral QHS PRN Marylin Crosby, MD   50 mg at 10/15/18 2138  . valACYclovir (VALTREX) tablet 500 mg  500 mg Oral Daily Lenward Chancellor, MD   500 mg at 10/16/18 0848    Lab Results:  Results for orders placed or performed during the hospital encounter of 10/12/18 (from the past 48 hour(s))  CBC with Differential/Platelet     Status: Abnormal   Collection Time: 10/15/18  3:25 PM  Result Value Ref Range   WBC 9.3 4.0 - 10.5 K/uL   RBC 3.81 (L) 3.87 - 5.11 MIL/uL   Hemoglobin 12.3 12.0 - 15.0 g/dL   HCT 37.7 36.0 - 46.0 %   MCV 99.0 80.0 - 100.0 fL   MCH 32.3 26.0 - 34.0 pg   MCHC 32.6 30.0 - 36.0 g/dL   RDW 13.9 11.5 - 15.5 %   Platelets 228 150 - 400 K/uL   nRBC 0.0 0.0 - 0.2 %   Neutrophils Relative % 65 %   Neutro Abs 6.1 1.7 - 7.7 K/uL   Lymphocytes Relative 21 %   Lymphs Abs 1.9 0.7 - 4.0 K/uL   Monocytes Relative 12 %   Monocytes Absolute 1.1 (H) 0.1 - 1.0 K/uL   Eosinophils Relative 2 %   Eosinophils Absolute 0.2 0.0 - 0.5 K/uL   Basophils Relative 0 %   Basophils Absolute 0.0 0.0 - 0.1 K/uL   Immature Granulocytes 0 %   Abs Immature Granulocytes 0.04 0.00 - 0.07 K/uL    Comment: Performed at [facility], [address] [place], [place] [address]    Blood Alcohol level:  Lab Results  Component Value Date   ETH 45 (H) 10/11/2018   ETH <10 64/40/3474    Metabolic Disorder Labs: Lab Results  Component Value Date   HGBA1C 5.4 10/13/2018   MPG 108.28 10/13/2018   MPG 111.15 08/29/2018   No results found for: PROLACTIN Lab Results  Component Value Date   CHOL 168 10/13/2018   TRIG 130 10/13/2018   HDL 39 (L) 10/13/2018   CHOLHDL 4.3 10/13/2018   VLDL 26 10/13/2018   LDLCALC 103 (H) 10/13/2018   LDLCALC 107 (H) 08/29/2018    Physical Findings: AIMS:  , ,  ,  ,    CIWA:    COWS:     Musculoskeletal: Strength & Muscle Tone: within normal limits Gait & Station: normal, no longer need wheelchair today Patient leans: Backward  Psychiatric Specialty Exam: Physical Exam  Nursing note and vitals reviewed.   Review of Systems  All other systems reviewed and are negative.   Blood pressure (!) 133/111, pulse (!) 102, temperature 99.6 F (37.6 C), temperature source Oral, resp. rate 16, height 4\' 11"  (1.499 m), weight 48.5 kg, SpO2 98 %.Body mass index is 21.61 kg/m.   Repeat BP in the afternoon was 93/61  with HR of 102  General Appearance: Casual  Eye Contact:  Good  Speech:  Clear and Coherent  Volume:  Normal  Mood:  Anxious  Affect:  Constricted  Thought Process:  Coherent and Goal Directed  Orientation:  Full (Time, Place, and Person)  Thought Content:  Paranoid Ideation  Suicidal Thoughts:  No  Homicidal Thoughts:  No  Memory:  Immediate;   Fair  Judgement:  Fair  Insight:  Lacking  Psychomotor Activity:  Normal  Concentration:  Concentration: Poor  Recall:  [employer] of Knowledge:  Fair  Language:  Fair  Akathisia:  No      Assets:  Resilience  ADL's:  Intact  Cognition:  Impaired,  Mild  Sleep:  Number of Hours: 6.45     Treatment Plan Summary: 62 yo female admitted due to mania and psychosis. She is calmer today. She is very somatic with frequent  pain complaints and frequently medication seeking including Adderall, opiates. She does have confirmed diagnosis of Parkinson's disease confirmed with DAT scan. She was started on Clozapine last night and will continue titration.  Plan:  Bipolar I disorder -Increase Clozapine to 25 mg qhs -ANC 6.1 -QTc 426 -Continue Wellbutrin XL 150 mg daily -Continue Lamictal 50 mg daily -Pt reports trazodone gives her nightmares. Will try prn benadryl  Parkinson's disease -Confirmed with DAT scan -She follows with Dr. Renee Ramus  Chronic Pain She follows with [facility]  Unsteady Gait -Will order PT consult to determine need for home PT, Appreciate recommendations  Anxiety -prn hydroxyzine  Dispo -She follows with [facility]-Rosalyn Starleen Blue. [facility] to offer more support at home. May also need home PT.  Marylin Crosby, MD 10/16/2018, 12:30 PM

## 2018-10-16 NOTE — Progress Notes (Signed)
Physical Therapy Treatment Patient Details Name: Leslie Wong MRN: [id number] DOB: December 31, 1955 Today's Date: 10/16/2018    History of Present Illness Pt is 62 y/o F with h/o Bipolar d/o , admitted under IVC. Per report,  patient went to visit her primary care doctor  and was acting so strangely and was so agitated and appeared to be so confused that they had her come to the emergency room. Pt endorsing depression, anxiety, labile, irritable, pressured speech, talks  about her chronic pain from her neck injury.  She has not been sleeping in days,  not been eating well prior to admission.  She has not been able to take care of her self and has not been staying back at her usual home. She reports that she was hospitalized at [facility] for a week, and released 1 week ago, states she is on lamictal 50mg  and not on tegretol, states her OP psychiatrist told her to take only 1.5 mg of Risperdal. Pt denies SI/HI. Denies AVH. She is been back to our emergency room more than once with complaints of her pain and was actually given injections of ketamine, pt states she has pain Dr in [facility] who giving her oxycodone 5-10mg . MD is concerned about need for physical therapy at discharge so PT consulted.     PT Comments    Pt admitted with above diagnosis. Pt currently with functional limitations due to the deficits listed below (see PT Problem List). Pt is modified independent with bed mobility and transfers. Good speed, sequencing, and stability with transfers. Once standing pt reports feeling lightheaded and complaining of vertigo. Diastolic BP elevated this AM so BP obtained in sitting and is 93/61 mmHg. Repeated with transfer and obtained in standing BP: 92/76 mmHg. Proceeded with ambulation using chair follow. MD is aware of BP readings. Pt is able to ambulate a full lap around the RN station with chair follow by RN due to patient's subjective report of lightheadedness during transfers. She  ambulates with decreased speed but functional for limited community mobility. Decreased step length noted and decreased push off. Pt is able to perform horizontal and vertical head turns with mild slowing but no significant staggering. She reports that she will not be homebound after discharge and she would benefit from OP PT for strength and balance. In particular pt would likely benefit from a program like [facility] that is tailored to Parkinson's patient given her recent diagnosis per subjective report. Pt is safe to return home with intermittent support by family.   Follow Up Recommendations  Outpatient PT;Other (comment)([facility] Parkinson's program at [facility])     Equipment Recommendations  None recommended by PT    Recommendations for Other Services       Precautions / Restrictions Precautions Precautions: Fall Restrictions Weight Bearing Restrictions: No    Mobility  Bed Mobility Overal bed mobility: Independent             General bed mobility comments: Good speed and sequencing  Transfers Overall transfer level: Independent Equipment used: None             General transfer comment: Good speed, sequencing, and stability with transfers. Once standing pt reports feeling lightheaded and complaining of vertigo. Diastolic BP elevated this AM so BP obtained and is 93/61. Repeated with transfer and obtained in standing BP: 92/76. Proceeded with ambulation using chair follow. MD is aware of BP  Ambulation/Gait Ambulation/Gait assistance: Min guard Gait Distance (Feet): 200 Feet Assistive device: None  General Gait Details: Pt is able to ambulate a full lap around the RN station with chair follow by RN due to patient's subjective report of lightheadedness during transfers. She ambulates with decreased speed but functional for limited community mobility. Decreased step length noted and decreased push off. Pt is able to perform horizontal and vertical head turns with mild  slowing but no significant staggering   Stairs             Wheelchair Mobility    Modified Rankin (Stroke Patients Only)       Balance Overall balance assessment: Needs assistance Sitting-balance support: No upper extremity supported Sitting balance-Leahy Scale: Good     Standing balance support: No upper extremity supported Standing balance-Leahy Scale: Good Standing balance comment: Negative Romberg. Single leg balance is 3-5 seconds                            Cognition Arousal/Alertness: Awake/alert Behavior During Therapy: Restless Overall Cognitive Status: Within Functional Limits for tasks assessed                                        Exercises      General Comments        Pertinent Vitals/Pain Pain Assessment: 0-10 Pain Score: 9  Pain Location: Neck and back Pain Descriptors / Indicators: Aching Pain Intervention(s): Monitored during session    Home Living Family/patient expects to be discharged to:: Private residence Living Arrangements: Alone Available Help at Discharge: Family Type of Home: Apartment Home Access: Level entry   Home Layout: One level Home Equipment: Environmental consultant - 2 wheels;Cane - single point;Shower seat - built in      Prior Function Level of Independence: Independent          PT Goals (current goals can now be found in the care plan section) Acute Rehab PT Goals Patient Stated Goal: Return to prior function PT Goal Formulation: With patient Time For Goal Achievement: 10/30/18 Potential to Achieve Goals: Good    Frequency    Min 2X/week      PT Plan      Co-evaluation              AM-PAC PT "6 Clicks" Daily Activity  Outcome Measure  Difficulty turning over in bed (including adjusting bedclothes, sheets and blankets)?: None Difficulty moving from lying on back to sitting on the side of the bed? : None Difficulty sitting down on and standing up from a chair with arms (e.g.,  wheelchair, bedside commode, etc,.)?: None Help needed moving to and from a bed to chair (including a wheelchair)?: None Help needed walking in hospital room?: None Help needed climbing 3-5 steps with a railing? : A Little 6 Click Score: 23    End of Session Equipment Utilized During Treatment: Gait belt Activity Tolerance: Patient tolerated treatment well Patient left: in bed;with call bell/phone within reach Nurse Communication: Mobility status;Other (comment)(Low BP readings) PT Visit Diagnosis: Unsteadiness on feet (R26.81);Other abnormalities of gait and mobility (R26.89)     Time: 5284-1324 PT Time Calculation (min) (ACUTE ONLY): 25 min  Charges:  $Gait Training: 8-22 mins                     Lyndel Safe Azelea Seguin PT, DPT, GCS    Charlie Char 10/16/2018, 12:44 PM

## 2018-10-16 NOTE — Progress Notes (Signed)
Recreation Therapy Notes   Date: 10/16/2018  Time: 9:30 am   Location: Craft room   Behavioral response: N/A   Intervention Topic: Emotions  Discussion/Intervention: Patient did not attend group.   Clinical Observations/Feedback:  Patient did not attend group.   Artie Takayama LRT/CTRS        Leslie Wong 10/16/2018 10:40 AM

## 2018-10-16 NOTE — Progress Notes (Signed)
Patient ID: Leslie Wong, female   DOB: 1956/02/14, 62 y.o.   MRN: [id number] PER STATE REGULATIONS 482.30  THIS CHART WAS REVIEWED FOR MEDICAL NECESSITY WITH RESPECT TO THE PATIENT'S ADMISSION/ DURATION OF STAY.  NEXT REVIEW DATE: 10/20/2018  Chauncy Lean, RN, BSN CASE MANAGER

## 2018-10-16 NOTE — BHH Suicide Risk Assessment (Signed)
[place] INPATIENT:  Family/Significant Other Suicide Prevention Education  Suicide Prevention Education:  Contact Attempts: Marylou Flesher Palmer/daughter/[phone] has been identified by the patient as the family member/significant other with whom the patient will be residing, and identified as the person(s) who will aid the patient in the event of a mental health crisis.  With written consent from the patient, two attempts were made to provide suicide prevention education, prior to and/or following the patient's discharge.  We were unsuccessful in providing suicide prevention education.  A suicide education pamphlet was given to the patient to share with family/significant other.  Date and time of first attempt: CSW attempted to contact the patients family member/ significant other to complete SPE and obtain collateral information. CSW was unable to leave a voicemail  10/16/2018 2:12 PM  Date and time of second attempt:  Darin Engels 10/16/2018, 2:12 PM

## 2018-10-16 NOTE — Progress Notes (Signed)
Leslie Wong in the milieu, attended group and was cooperative. Anxious and restless and complaining of back and leg pain, and receiving  Medications as prescribed. Alert and oriented.  Received  Bedtime medications and went to bed. Patient woke up complaining of pain rated at 10/10 and received medication. Emotional support and encouragements provided. Safety monitored per unit protocol.

## 2018-10-16 NOTE — Progress Notes (Addendum)
Received Leslie Wong this AM after her breakfast, she was compliant with her medications. She denied all of the psychiatric symptoms this AM although her behavior indicates anxiety related to her medications.  Later she requested vistaril for itching. She continues to pace at intervals in despair around the nurses station.

## 2018-10-16 NOTE — Care Management (Signed)
Attempted to get Outpatient PT orders signed by MD not available. Will reattempt tomorrow.

## 2018-10-16 NOTE — BHH Group Notes (Signed)
  LCSW Group Therapy Note  Tuesday Oct 22/19 at 1:00pm  Type of Therapy/Topic:  Group Therapy:  Feelings about Diagnosis  Participation Level:  Patient did not attend- resting in room  Description of Group:   This group will allow patients to explore their thoughts and feelings about diagnoses they have received. Patients will be guided to explore their level of understanding and acceptance of these diagnoses. Facilitator will encourage patients to process their thoughts and feelings about the reactions of others to their diagnosis and will guide patients in identifying ways to discuss their diagnosis with significant others in their lives. This group will be process-oriented, with patients participating in exploration of their own experiences, giving and receiving support, and processing challenge from other group members.   Therapeutic Goals: 1. Patient will demonstrate understanding of diagnosis as evidenced by identifying two or more symptoms of the disorder 2. Patient will be able to express two feelings regarding the diagnosis 3. Patient will demonstrate their ability to communicate their needs through discussion and/or role play  Summary of Patient Progress:  Therapeutic Modalities:   Cognitive Behavioral Therapy Brief Therapy Feelings Identification    Gerrad Welker LCSW [id number]

## 2018-10-16 NOTE — Care Management (Signed)
RNCM consult received for Outpatient PT at [facility]. CHhart reviewed. Will work on getting order from MD order and sending to [facility] PT department for follow up at [place].

## 2018-10-16 NOTE — Plan of Care (Signed)
Anxious and restless but compliant with treatment. Pleasant

## 2018-10-17 MED ORDER — CLOZAPINE 25 MG PO TABS
50.0000 mg | ORAL_TABLET | Freq: Every day | ORAL | Status: DC
  Administered 2018-10-17: 50 mg via ORAL
  Filled 2018-10-17: qty 2

## 2018-10-17 MED ORDER — MAGIC MOUTHWASH
5.0000 mL | Freq: Two times a day (BID) | ORAL | Status: DC
  Administered 2018-10-17 – 2018-10-22 (×10): 5 mL via ORAL
  Filled 2018-10-17 (×11): qty 10

## 2018-10-17 MED ORDER — DIPHENHYDRAMINE HCL 25 MG PO CAPS: 50.0000 mg | ORAL_CAPSULE | Freq: Every evening | ORAL | Status: DC | PRN

## 2018-10-17 NOTE — Progress Notes (Signed)
Recreation Therapy Notes  Date: 10/17/2018  Time: 9:30 am   Location: Craft room   Behavioral response: N/A   Intervention Topic: Communication  Discussion/Intervention: Patient did not attend group.   Clinical Observations/Feedback:  Patient did not attend group.   Arshad Oberholzer LRT/CTRS        Shaylene Paganelli 10/17/2018 12:05 PM

## 2018-10-17 NOTE — BHH Group Notes (Signed)
  Emotional Regulation October 23 1PM  Type of Therapy/Topic:  Group Therapy:  Emotion Regulation  Participation Level: Good Participation  Description of Group:   The purpose of this group is to assist patients in learning to regulate negative emotions and experience positive emotions. Patients will be guided to discuss ways in which they have been vulnerable to their negative emotions. These vulnerabilities will be juxtaposed with experiences of positive emotions or situations, and patients will be challenged to use positive emotions to combat negative ones. Special emphasis will be placed on coping with negative emotions in conflict situations, and patients will process healthy conflict resolution skills.  Therapeutic Goals: 1. Patient will identify two positive emotions or experiences to reflect on in order to balance out negative emotions 2. Patient will label two or more emotions that they find the most difficult to experience 3. Patient will demonstrate positive conflict resolution skills through discussion and/or role plays  Summary of Patient Progress:  This patient was able to follow group contact and was ok to share, she was having issues sitting for long periods of time. She was very polite to group members.     Therapeutic Modalities:   Cognitive Behavioral Therapy Feelings Identification Dialectical Behavioral Therapy   Gray Doering LCSW 2:15 pm

## 2018-10-17 NOTE — Progress Notes (Signed)
Patient alert and oriented, affect is flat but brightens upon approach ,no distress noted mood is less irritable, interacting appropriately with peers and staff, thoughts are organized and coherent no distress noted, will continue to monitor. 15 minutes safety checks maintained

## 2018-10-17 NOTE — BHH Counselor (Signed)
CSW sent off [facility] referral for the patient.  Darin Engels, MSW, Latanya Presser, [place] Clinical Social Worker 10/17/2018 2:12 PM

## 2018-10-17 NOTE — Progress Notes (Addendum)
Received Leslie Wong this AM after breakfast, she was ready for her morning medications and requested a pain pill. She was  reminded she received her pain medication at 0621 hrs this AM. She was focused all day on when her next pill is due. She denied all of the psychiatric symptoms, but appeared to be anxious throughout the day. On her self inventory sheet she rated anxiety and depression 6/10.

## 2018-10-17 NOTE — Progress Notes (Signed)
Clozapine monitoring Consult   62 yo female ordered clozapine PO  10/21 ANC 6100  Information entered into Clozapine registry and pt eligible to receive clozapine Next labs due in a week - ordered for 10/28  Pharmacy will continue to follow.   Prudy Feeler, RPh 10/17/2018 3:52 PM

## 2018-10-17 NOTE — [facility] Group Notes (Signed)
[facility] Group Notes:  (Nursing/MHT/Case Management/Adjunct)  Date:  10/17/2018  Time:  9:57 PM  Type of Therapy:  Group Therapy  Participation Level:  Active  Participation Quality:  Appropriate  Affect:  Excited  Cognitive:  Oriented  Insight:  Good  Engagement in Group:  Engaged  Modes of Intervention:  Discussion  Summary of Progress/Problems: Group focus is on communication with people that are different from you, patient fully participated and asked me where are you from, you have and ascent, I said from [place] , and she said what languages do you speak over there and I said Igbo, she laughed.   Clemens Catholic 10/17/2018, 9:57 PM

## 2018-10-17 NOTE — Plan of Care (Signed)
  Problem: Education: Goal: Mental status will improve Outcome: Progressing  Thought are organized  Problem: Education: Goal: Verbalization of understanding the information provided will improve Outcome: Progressing  Patient verbalized verbalization of information provided.

## 2018-10-17 NOTE — Progress Notes (Addendum)
[facility] MD Progress Note  10/17/2018 3:33 PM Leslie Wong  MRN:  [id number] Subjective:  Pt has been calm and pleasant. She is spending time outside with peers this afternoon  Picking flowers. She states that she is feeling much better. She states that "I have my energy back now." She states that she is still "sweating out black mold at night" so her sleep is affected. However, sleep is better than on admission. She is a bit tangential in speech but redirectable. She is caring well for herself. She is smiling and happy today. She denies SI. She is worried about her dog who is currently boarding while she is in the hospital. She is much steadier on her feet and no longer requires wheelchair.   Principal Problem: Bipolar disorder with severe mania ([place]) Diagnosis:   Patient Active Problem List   Diagnosis Date Noted  . Bipolar disorder with severe mania ([place]) [F31.13]     Priority: High  . Mood change [R45.86] 10/14/2018  . Bipolar I disorder, current or most recent episode manic, with psychotic features ([place]) [F31.2] 08/28/2018  . Itch of skin [L29.9] 08/15/2018  . Oral ulcer [K12.1] 08/15/2018  . Night sweats [R61] 05/13/2018  . Pupil asymmetry [H57.02] 05/13/2018  . Hx of cold sores [Z86.19] 03/14/2018  . Parkinson disease ([place]) [G20]   . Health care maintenance [Z00.00] 06/23/2017  . HLD (hyperlipidemia) [E78.5] 03/10/2016  . Advance care planning [Z71.89] 03/10/2016  . Generalized abdominal pain [R10.84] 02/18/2015  . Osteopenia [M85.80] 04/18/2014  . Medicare annual wellness visit, initial [Z00.00] 03/18/2014  . Shoulder pain [M25.519] 01/17/2013  . Chronic back pain [M54.9, G89.29] 09/14/2011  . Hypercalcemia [E83.52] 03/25/2010  . NAUSEA WITH VOMITING [R11.2] 05/12/2009  . INCONTINENCE, URGE [N39.41] 04/16/2009  . CARPAL TUNNEL SYNDROME, BILATERAL [G56.00] 09/24/2008  . Tongue irritation [K14.9] 09/24/2008  . URETHRAL STRICTURE [N35.919] 06/13/2007  . Hypothyroidism  [E03.9] 06/05/2007  . DISORDER, BIPOLAR NOS [F31.9] 06/05/2007  . MIGRAINE HEADACHE [G43.909] 06/05/2007  . GASTROPARESIS [K31.84] 06/05/2007  . Gastroesophageal reflux disease with hiatal hernia [K21.9, K44.9] 06/05/2007  . IRRITABLE BOWEL SYNDROME [K58.9] 06/05/2007  . FIBROCYSTIC BREAST DISEASE [N60.19] 06/05/2007   Total Time spent with patient: 20 minutes  Past Psychiatric History: See H&P  Past Medical History:  Past Medical History:  Diagnosis Date  . Back pain   . Cancer ([place])    skin  . Depression    BAD  . Gastroparesis   . GERD (gastroesophageal reflux disease)   . Hypothyroidism   . Insomnia   . Migraines   . Parkinson disease ([place])    per [place] [facility], dx'd 2019  . Recurrent cold sores   . Shoulder bursitis   . Urge incontinence     Past Surgical History:  Procedure Laterality Date  . ABDOMINAL HYSTERECTOMY  1999   total  . APPENDECTOMY    . ESOPHAGOGASTRODUODENOSCOPY  01/2006   negative except small hiatal hernia  . ESOPHAGOGASTRODUODENOSCOPY  02/24/2015   See report  . LAPAROSCOPY     for endometriosis  . OVARIAN CYST REMOVAL  1990   unilateral  . TONSILLECTOMY AND ADENOIDECTOMY     Family History:  Family History  Problem Relation Age of Onset  . Hypertension Mother   . Arthritis Mother   . Diabetes Mother   . Stroke Mother   . Cancer Father        lung  . Colon cancer Neg Hx   . Breast cancer Neg Hx  Family Psychiatric  History: See h&P Social History:  Social History   Substance and Sexual Activity  Alcohol Use No  . Alcohol/week: 0.0 standard drinks     Social History   Substance and Sexual Activity  Drug Use No    Social History   Socioeconomic History  . Marital status: Divorced    Spouse name: Not on file  . Number of children: Not on file  . Years of education: Not on file  . Highest education level: Not on file  Occupational History  . Not on file  Social Needs  . Financial resource strain: Not on file  . Food  insecurity:    Worry: Not on file    Inability: Not on file  . Transportation needs:    Medical: Not on file    Non-medical: Not on file  Tobacco Use  . Smoking status: Former Smoker    Packs/day: 1.00    Years: 42.00    Pack years: 42.00    Types: Cigarettes  . Smokeless tobacco: Former Network engineer and Sexual Activity  . Alcohol use: No    Alcohol/week: 0.0 standard drinks  . Drug use: No  . Sexual activity: Never  Lifestyle  . Physical activity:    Days per week: Not on file    Minutes per session: Not on file  . Stress: Not on file  Relationships  . Social connections:    Talks on phone: Not on file    Gets together: Not on file    Attends religious service: Not on file    Active member of club or organization: Not on file    Attends meetings of clubs or organizations: Not on file    Relationship status: Not on file  Other Topics Concern  . Not on file  Social History Narrative   Lives alone.     Has a Counsellor   Additional Social History:                         Sleep: Fair  Appetite:  Fair  Current Medications: Current Facility-Administered Medications  Medication Dose Route Frequency Provider Last Rate Last Dose  . acetaminophen (TYLENOL) tablet 650 mg  650 mg Oral Q6H PRN [name], [name], MD   650 mg at 10/13/18 8841  . alum & mag hydroxide-simeth (MAALOX/MYLANTA) 200-200-20 MG/5ML suspension 30 mL  30 mL Oral Q4H PRN [name], [name], MD      . benzocaine (ORAJEL) 10 % mucosal gel   Mouth/Throat QID PRN Lenward Chancellor, MD      . buPROPion (WELLBUTRIN XL) 24 hr tablet 150 mg  150 mg Oral Daily Kamariya Blevens, Tyson Babinski, MD   150 mg at 10/17/18 6606  . chlorhexidine (PERIDEX) 0.12 % solution 15 mL  15 mL Mouth/Throat BID Lenward Chancellor, MD   15 mL at 10/17/18 0834  . cloZAPine (CLOZARIL) tablet 25 mg  25 mg Oral QHS Marylin Crosby, MD   25 mg at 10/16/18 2122  . feeding supplement (ENSURE ENLIVE) (ENSURE ENLIVE) liquid 237 mL  237 mL Oral BID  BM Jolynn Bajorek R, MD   237 mL at 10/17/18 1000  . fluticasone (FLONASE) 50 MCG/ACT nasal spray 1 spray  1 spray Each Nare BID PRN Lenward Chancellor, MD   1 spray at 10/17/18 3016  . hydrOXYzine (ATARAX/VISTARIL) tablet 50 mg  50 mg Oral TID PRN [name], [name], MD   50 mg at  10/17/18 1406  . lamoTRIgine (LAMICTAL) tablet 50 mg  50 mg Oral Daily Lenward Chancellor, MD   50 mg at 10/17/18 5462  . levothyroxine (SYNTHROID, LEVOTHROID) tablet 88 mcg  88 mcg Oral QAC breakfast [name], [name], MD   88 mcg at 10/17/18 7035  . lip balm (BLISTEX) ointment   Topical PRN Maleeka Sabatino R, MD      . magnesium hydroxide (MILK OF MAGNESIA) suspension 30 mL  30 mL Oral Daily PRN [name], [name], MD   30 mL at 10/14/18 1415  . ondansetron (ZOFRAN) tablet 4 mg  4 mg Oral TID PRN Lenward Chancellor, MD   4 mg at 10/16/18 0852  . oxyCODONE-acetaminophen (PERCOCET/ROXICET) 5-325 MG per tablet 1 tablet  1 tablet Oral Q6H PRN [name], [name], MD   1 tablet at 10/17/18 1406  . pantoprazole (PROTONIX) EC tablet 40 mg  40 mg Oral Daily [name], [name], MD   40 mg at 10/17/18 0835  . tiZANidine (ZANAFLEX) tablet 4 mg  4 mg Oral TID PRN Lenward Chancellor, MD   4 mg at 10/17/18 0836  . traZODone (DESYREL) tablet 50 mg  50 mg Oral QHS PRN Marylin Crosby, MD   50 mg at 10/16/18 2122  . valACYclovir (VALTREX) tablet 500 mg  500 mg Oral Daily Lenward Chancellor, MD   500 mg at 10/17/18 0093    Lab Results: No results found for this or any previous visit (from the past 48 hour(s)).  Blood Alcohol level:  Lab Results  Component Value Date   ETH 45 (H) 10/11/2018   ETH <10 81/82/9937    Metabolic Disorder Labs: Lab Results  Component Value Date   HGBA1C 5.4 10/13/2018   MPG 108.28 10/13/2018   MPG 111.15 08/29/2018   No results found for: PROLACTIN Lab Results  Component Value Date   CHOL 168 10/13/2018   TRIG 130 10/13/2018   HDL 39 (L) 10/13/2018   CHOLHDL 4.3 10/13/2018   VLDL 26 10/13/2018   LDLCALC 103 (H)  10/13/2018   LDLCALC 107 (H) 08/29/2018    Physical Findings: AIMS:  , ,  ,  ,    CIWA:    COWS:     Musculoskeletal: Strength & Muscle Tone: within normal limits Gait & Station: normal Patient leans: N/A  Psychiatric Specialty Exam: Physical Exam  Nursing note and vitals reviewed.   Review of Systems  All other systems reviewed and are negative.   Blood pressure 98/67, pulse 79, temperature 98.2 F (36.8 C), temperature source Oral, resp. rate 18, height 4\' 11"  (1.499 m), weight 48.5 kg, SpO2 100 %.Body mass index is 21.61 kg/m.  General Appearance: Casual  Eye Contact:  Good  Speech:  Clear and Coherent  Volume:  Normal  Mood:  Euthymic  Affect:  Congruent  Thought Process:  Coherent and Goal Directed  Orientation:  NA  Thought Content:  Delusions  Suicidal Thoughts:  No  Homicidal Thoughts:  No  Memory:  Immediate;   Fair  Judgement:  Impaired  Insight:  Lacking  Psychomotor Activity:  Normal  Concentration:  Concentration: Poor  Recall:  Poor  Fund of Knowledge:  Fair  Language:  Fair  Akathisia:  No      Assets:  Resilience  ADL's:  Intact  Cognition:  Impaired,  Mild  Sleep:  Number of Hours: 5.5     Treatment Plan Summary: 62 yo female admitted due to psychosis and mania. She is sleeping better. She is  much less intrusive and somatic. She is still tangential and disorganized at times but redirectable. I do feel there is a component of cognitive impairment, as well. CSW has made [facility] referral to have more support at home with medication management. She will also be referred for Home PT. She is tolerating Clozapine titration well so far.   Plan:  Bipolar I disorder -Increase Clozapine 50 mg qhs. Will increase to 75 mg tomorrow night if she tolerates dose tonight. Goal will be to get to 100 mg qhs prior to discharge -ANC 6.1 -Continue Wellbutrin XL 150 mg daily -Continue Lamictal50 mg daily  Parkinson's disease -Confirmed with DAT scan -She follows  with Dr. Renee Ramus -will refer for outpatient or home health PT  Chronic Pain She follows with [facility]  Unsteady Gait -Outpatient PT -Gait improved  Anxiety -prn hydroxyzine  Dispo -She follows with [facility] for mental health-Rosalyn Starleen Blue. CSW to make [facility] referral to offer more support at home  Marylin Crosby, MD 10/17/2018, 3:33 PM

## 2018-10-18 MED ORDER — CLOZAPINE 25 MG PO TABS
75.0000 mg | ORAL_TABLET | Freq: Every day | ORAL | Status: DC
  Administered 2018-10-18: 75 mg via ORAL
  Filled 2018-10-18: qty 3

## 2018-10-18 NOTE — Plan of Care (Signed)
  Problem: Coping: Goal: Ability to identify and develop effective coping behavior will improve Outcome: Progressing  Patient has more effective coping skill, appears less anxious.

## 2018-10-18 NOTE — Progress Notes (Signed)
Recreation Therapy Notes  Date: 10/18/2018  Time: 9:30 am   Location: Craft room   Behavioral response: N/A   Intervention Topic: Problem Solving  Discussion/Intervention: Patient did not attend group.   Clinical Observations/Feedback:  Patient did not attend group.   Haille Pardi LRT/CTRS        Leanza Shepperson 10/18/2018 11:06 AM

## 2018-10-18 NOTE — [facility] Group Notes (Signed)
LCSW Group Therapy Note  10/18/2018 1:00 pm  Type of Therapy/Topic:  Group Therapy:  Balance in Life  Participation Level:  Active  Description of Group:    This group will address the concept of balance and how it feels and looks when one is unbalanced. Patients will be encouraged to process areas in their lives that are out of balance and identify reasons for remaining unbalanced. Facilitators will guide patients in utilizing problem-solving interventions to address and correct the stressor making their life unbalanced. Understanding and applying boundaries will be explored and addressed for obtaining and maintaining a balanced life. Patients will be encouraged to explore ways to assertively make their unbalanced needs known to significant others in their lives, using other group members and facilitator for support and feedback.  Therapeutic Goals: 1. Patient will identify two or more emotions or situations they have that consume much of in their lives. 2. Patient will identify signs/triggers that life has become out of balance:  3. Patient will identify two ways to set boundaries in order to achieve balance in their lives:  4. Patient will demonstrate ability to communicate their needs through discussion and/or role plays  Summary of Patient Progress: Leslie Wong actively participated in today's group discussion on balance in life.  She was able to identify two situations that have consumed much of her life as her son's addictions for the past 24 years and the death of her parents.  Leslie Wong shared that stress can often trigger her to become out of balance and that she often talks to a friend to help her achieve more balance in her life.     Therapeutic Modalities:   Cognitive Behavioral Therapy Solution-Focused Therapy Assertiveness Training  Devona Konig, [place] 10/18/2018 4:04 PM

## 2018-10-18 NOTE — Progress Notes (Signed)
[facility] MD Progress Note  10/18/2018 12:08 PM Leslie Wong  MRN:  [id number] Subjective:  Pt was sleeping very soundly most of the morning. She is sleeping much better. She is much calmer and less anxious. She is much more organized in her thoughts. She is much less perseverative on pain and somatic symptoms. She spent most of the afternoon outside in the courtyard yesterday and happily showed me the flowers she picked. She has not endorsed SI through her hospital stay. Affect is brighter and less labile. I spoke with her daughter again today. She states that she sounds slightly better on the phone and less anxious. We discussed the medication adjustments that are being made with the Clozapine. She states that this is a good idea.   Principal Problem: Bipolar disorder with severe mania ([place]) Diagnosis:   Patient Active Problem List   Diagnosis Date Noted  . Bipolar disorder with severe mania ([place]) [F31.13]     Priority: High  . Mood change [R45.86] 10/14/2018  . Bipolar I disorder, current or most recent episode manic, with psychotic features ([place]) [F31.2] 08/28/2018  . Itch of skin [L29.9] 08/15/2018  . Oral ulcer [K12.1] 08/15/2018  . Night sweats [R61] 05/13/2018  . Pupil asymmetry [H57.02] 05/13/2018  . Hx of cold sores [Z86.19] 03/14/2018  . Parkinson disease ([place]) [G20]   . Health care maintenance [Z00.00] 06/23/2017  . HLD (hyperlipidemia) [E78.5] 03/10/2016  . Advance care planning [Z71.89] 03/10/2016  . Generalized abdominal pain [R10.84] 02/18/2015  . Osteopenia [M85.80] 04/18/2014  . Medicare annual wellness visit, initial [Z00.00] 03/18/2014  . Shoulder pain [M25.519] 01/17/2013  . Chronic back pain [M54.9, G89.29] 09/14/2011  . Hypercalcemia [E83.52] 03/25/2010  . NAUSEA WITH VOMITING [R11.2] 05/12/2009  . INCONTINENCE, URGE [N39.41] 04/16/2009  . CARPAL TUNNEL SYNDROME, BILATERAL [G56.00] 09/24/2008  . Tongue irritation [K14.9] 09/24/2008  . URETHRAL STRICTURE  [N35.919] 06/13/2007  . Hypothyroidism [E03.9] 06/05/2007  . DISORDER, BIPOLAR NOS [F31.9] 06/05/2007  . MIGRAINE HEADACHE [G43.909] 06/05/2007  . GASTROPARESIS [K31.84] 06/05/2007  . Gastroesophageal reflux disease with hiatal hernia [K21.9, K44.9] 06/05/2007  . IRRITABLE BOWEL SYNDROME [K58.9] 06/05/2007  . FIBROCYSTIC BREAST DISEASE [N60.19] 06/05/2007   Total Time spent with patient: 20 minutes  Past Psychiatric History: See H&P  Past Medical History:  Past Medical History:  Diagnosis Date  . Back pain   . Cancer ([place])    skin  . Depression    BAD  . Gastroparesis   . GERD (gastroesophageal reflux disease)   . Hypothyroidism   . Insomnia   . Migraines   . Parkinson disease ([place])    per [facility], dx'd 2019  . Recurrent cold sores   . Shoulder bursitis   . Urge incontinence     Past Surgical History:  Procedure Laterality Date  . ABDOMINAL HYSTERECTOMY  1999   total  . APPENDECTOMY    . ESOPHAGOGASTRODUODENOSCOPY  01/2006   negative except small hiatal hernia  . ESOPHAGOGASTRODUODENOSCOPY  02/24/2015   See report  . LAPAROSCOPY     for endometriosis  . OVARIAN CYST REMOVAL  1990   unilateral  . TONSILLECTOMY AND ADENOIDECTOMY     Family History:  Family History  Problem Relation Age of Onset  . Hypertension Mother   . Arthritis Mother   . Diabetes Mother   . Stroke Mother   . Cancer Father        lung  . Colon cancer Neg Hx   . Breast cancer Neg Hx  Family Psychiatric  History: See H&P Social History:  Social History   Substance and Sexual Activity  Alcohol Use No  . Alcohol/week: 0.0 standard drinks     Social History   Substance and Sexual Activity  Drug Use No    Social History   Socioeconomic History  . Marital status: Divorced    Spouse name: Not on file  . Number of children: Not on file  . Years of education: Not on file  . Highest education level: Not on file  Occupational History  . Not on file  Social Needs  .  Financial resource strain: Not on file  . Food insecurity:    Worry: Not on file    Inability: Not on file  . Transportation needs:    Medical: Not on file    Non-medical: Not on file  Tobacco Use  . Smoking status: Former Smoker    Packs/day: 1.00    Years: 42.00    Pack years: 42.00    Types: Cigarettes  . Smokeless tobacco: Former Network engineer and Sexual Activity  . Alcohol use: No    Alcohol/week: 0.0 standard drinks  . Drug use: No  . Sexual activity: Never  Lifestyle  . Physical activity:    Days per week: Not on file    Minutes per session: Not on file  . Stress: Not on file  Relationships  . Social connections:    Talks on phone: Not on file    Gets together: Not on file    Attends religious service: Not on file    Active member of club or organization: Not on file    Attends meetings of clubs or organizations: Not on file    Relationship status: Not on file  Other Topics Concern  . Not on file  Social History Narrative   Lives alone.     Has a Counsellor   Additional Social History:                         Sleep: Good  Appetite:  Good  Current Medications: Current Facility-Administered Medications  Medication Dose Route Frequency Provider Last Rate Last Dose  . acetaminophen (TYLENOL) tablet 650 mg  650 mg Oral Q6H PRN [name], [name], MD   650 mg at 10/18/18 4403  . alum & mag hydroxide-simeth (MAALOX/MYLANTA) 200-200-20 MG/5ML suspension 30 mL  30 mL Oral Q4H PRN [name], [name], MD      . benzocaine (ORAJEL) 10 % mucosal gel   Mouth/Throat QID PRN Lenward Chancellor, MD      . buPROPion (WELLBUTRIN XL) 24 hr tablet 150 mg  150 mg Oral Daily Daven Montz, Tyson Babinski, MD   150 mg at 10/18/18 0854  . cloZAPine (CLOZARIL) tablet 75 mg  75 mg Oral QHS Perfecto Purdy R, MD      . diphenhydrAMINE (BENADRYL) capsule 50 mg  50 mg Oral QHS PRN Raegen Tarpley R, MD      . feeding supplement (ENSURE ENLIVE) (ENSURE ENLIVE) liquid 237 mL  237 mL Oral BID BM  Saren Corkern R, MD   237 mL at 10/17/18 1400  . fluticasone (FLONASE) 50 MCG/ACT nasal spray 1 spray  1 spray Each Nare BID PRN Lenward Chancellor, MD   1 spray at 10/17/18 0833  . hydrOXYzine (ATARAX/VISTARIL) tablet 50 mg  50 mg Oral TID PRN [name], [name], MD   50 mg at 10/17/18 2121  . lamoTRIgine (LAMICTAL)  tablet 50 mg  50 mg Oral Daily Lenward Chancellor, MD   50 mg at 10/18/18 0853  . levothyroxine (SYNTHROID, LEVOTHROID) tablet 88 mcg  88 mcg Oral QAC breakfast [name], [name], MD   88 mcg at 10/18/18 6606  . lip balm (BLISTEX) ointment   Topical PRN Shawndrea Rutkowski, Tyson Babinski, MD      . magic mouthwash  5 mL Oral BID Weltha Cathy, Tyson Babinski, MD   5 mL at 10/18/18 0854  . magnesium hydroxide (MILK OF MAGNESIA) suspension 30 mL  30 mL Oral Daily PRN [name], [name], MD   30 mL at 10/14/18 1415  . ondansetron (ZOFRAN) tablet 4 mg  4 mg Oral TID PRN Lenward Chancellor, MD   4 mg at 10/16/18 0852  . oxyCODONE-acetaminophen (PERCOCET/ROXICET) 5-325 MG per tablet 1 tablet  1 tablet Oral Q6H PRN [name], [name], MD   1 tablet at 10/17/18 2121  . pantoprazole (PROTONIX) EC tablet 40 mg  40 mg Oral Daily [name], [name], MD   40 mg at 10/18/18 0854  . tiZANidine (ZANAFLEX) tablet 4 mg  4 mg Oral TID PRN Lenward Chancellor, MD   4 mg at 10/18/18 3016  . valACYclovir (VALTREX) tablet 500 mg  500 mg Oral Daily Lenward Chancellor, MD   500 mg at 10/18/18 0109    Lab Results: No results found for this or any previous visit (from the past 48 hour(s)).  Blood Alcohol level:  Lab Results  Component Value Date   ETH 45 (H) 10/11/2018   ETH <10 32/35/5732    Metabolic Disorder Labs: Lab Results  Component Value Date   HGBA1C 5.4 10/13/2018   MPG 108.28 10/13/2018   MPG 111.15 08/29/2018   No results found for: PROLACTIN Lab Results  Component Value Date   CHOL 168 10/13/2018   TRIG 130 10/13/2018   HDL 39 (L) 10/13/2018   CHOLHDL 4.3 10/13/2018   VLDL 26 10/13/2018   LDLCALC 103 (H) 10/13/2018   LDLCALC 107 (H)  08/29/2018    Physical Findings: AIMS:  , ,  ,  ,    CIWA:    COWS:     Musculoskeletal: Strength & Muscle Tone: within normal limits Gait & Station: normal Patient leans: N/A  Psychiatric Specialty Exam: Physical Exam  Nursing note and vitals reviewed.   Review of Systems  All other systems reviewed and are negative.   Blood pressure 116/75, pulse 89, temperature 99.9 F (37.7 C), temperature source Oral, resp. rate 16, height 4\' 11"  (1.499 m), weight 48.5 kg, SpO2 100 %.Body mass index is 21.61 kg/m.  General Appearance: Casual  Eye Contact:  Good  Speech:  Clear and Coherent  Volume:  Normal  Mood:  Euthymic  Affect:  Appropriate  Thought Process:  Coherent and Goal Directed, tangential at times but improving  Orientation:  Full (Time, Place, and Person)  Thought Content:  Logical  Suicidal Thoughts:  No  Homicidal Thoughts:  No  Memory:  Immediate;   Fair  Judgement:  Fair  Insight:  Fair  Psychomotor Activity:  Normal  Concentration:  Concentration: Fair  Recall:  [employer] of Knowledge:  Fair  Language:  Fair  Akathisia:  No      Assets:  Resilience  ADL's:  Intact  Cognition:  WNL  Sleep:  Number of Hours: 7     Treatment Plan Summary: 62 yo female admitted due to mania and psychosis. She is showing great improvements since starting Clozapine. She  is sleeping much better. She is much less anxious and pain focused. She is more organized in her thinking. Her BP has been stable with Clozapine increase.  Plan:  Bipolar I disorder -Increase Clozapine to 75 mg qhs and continue gradual titration as tolerated -Continue Wellbutrin XL 150 mg daily -Continue Lamictal 50 mg daily  Parkinson's disease --Confirmed with DAT scan -She follows with Dr. Renee Ramus -will refer for outpatient or home health PT  Chronic Pain She follows with [facility]  Unsteady Gait -Outpatient PT -Gait improved  Anxiety -prn hydroxyzine  Dispo -She  follows with [facility] for mental health-Rosalyn Starleen Blue. CSW to make [facility] referral to offer more support at home. I spoke with her daughter again today to provide update  Marylin Crosby, MD 10/18/2018, 12:08 PM

## 2018-10-18 NOTE — [facility] Group Notes (Signed)
[facility] Group Notes:  (Nursing/MHT/Case Management/Adjunct)  Date:  10/18/2018  Time:  10:23 PM  Type of Therapy:  Group Therapy  Participation Level:  Minimal  Participation Quality:  Appropriate  Affect:  Appropriate  Cognitive:  Appropriate  Insight:  Appropriate  Engagement in Group:  Engaged  Modes of Intervention:  Discussion  Summary of Progress/Problems: Leslie Wong made it to the last half of group. Leslie Wong did not share. Leslie Wong was engaged for the portion of group she attended. MHT reviewed the rules and expectations of the unit. MHT discussed visitation hours and number of visitors allowed on the unit. MHT reviewed phone hours and limitations on long distance phone calls. MHT informed patients of vitals taken at 6am. MHT encouraged patients to complete the daily self-inventory sheets. MHT informed patients they were not to share, borrow, or give away clothes or personal belongings. MHT encouraged patients not to share last names or personal information. MHT informed patients of routine checks and encouraged to cover appropriately throughout the night. MHT informed patient's their room temperatures could be adjusted and just let the MHT's know when doing rounds MHT processed with patients about stress. MHT encouraged patients to find things they enjoyed and that would help keep their stress at a minimum. MHT processed with patients about things they could do to help reduce stress. MHT discussed developing a plan to address their everyday stressors. MHT informed patients of outpatient treatment. MHT informed patients they would need to develop a discharge plan with doctor before leaving the unit.   Barnie Mort 10/18/2018, 10:23 PM

## 2018-10-18 NOTE — Progress Notes (Signed)
Patient alert and oriented, affect is flat but brightens upon approach  She denies SI/HI/AVH ,no distress noted mood is pleasant and receptive to staff, interacting appropriately with peers  thoughts are organized and coherent no distress noted, will continue to monitor. Patient attended evening wrap up group and interacted appropriately wi15 minutes safety checks maintained

## 2018-10-18 NOTE — Plan of Care (Signed)
Patient states "I had a pretty good day."Denies SI,HI and AVH.Attended groups.Compliant with medications.Appetite and energy level good.Support and encouragement given.

## 2018-10-19 MED ORDER — CLOZAPINE 100 MG PO TABS
100.0000 mg | ORAL_TABLET | Freq: Every day | ORAL | Status: DC
  Administered 2018-10-19 – 2018-10-21 (×3): 100 mg via ORAL
  Filled 2018-10-19 (×3): qty 1

## 2018-10-19 NOTE — Progress Notes (Signed)
Recreation Therapy Notes  Date: 10/19/2018  Time: 9:30 am   Location: Craft room   Behavioral response: N/A   Intervention Topic: Leisure  Discussion/Intervention: Patient did not attend group.   Clinical Observations/Feedback:  Patient did not attend group.   Hiro Vipond LRT/CTRS         Dayja Loveridge 10/19/2018 11:29 AM

## 2018-10-19 NOTE — [facility] Group Notes (Signed)
Feelings Around Relapse 10/19/2018 1PM  Type of Therapy and Topic:  Group Therapy:  Feelings around Relapse and Recovery  Participation Level:  Active   Description of Group:    Patients in this group will discuss emotions they experience before and after a relapse. They will process how experiencing these feelings, or avoidance of experiencing them, relates to having a relapse. Facilitator will guide patients to explore emotions they have related to recovery. Patients will be encouraged to process which emotions are more powerful. They will be guided to discuss the emotional reaction significant others in their lives may have to patients' relapse or recovery. Patients will be assisted in exploring ways to respond to the emotions of others without this contributing to a relapse.  Therapeutic Goals: 1. Patient will identify two or more emotions that lead to a relapse for them 2. Patient will identify two emotions that result when they relapse 3. Patient will identify two emotions related to recovery 4. Patient will demonstrate ability to communicate their needs through discussion and/or role plays   Summary of Patient Progress: Actively and appropriately engaged in the group. Patient was able to provide support and validation to other group members.Patient practiced active listening when interacting with the facilitator and other group members. Pt reports "If I sleep past 6 hours I get agitated." Pt identified prayer as a method to prevent relapse. Patient is still in the process of obtaining treatment goals.      Therapeutic Modalities:   Cognitive Behavioral Therapy Solution-Focused Therapy Assertiveness Training Relapse Prevention Therapy   Yvette Rack, LCSW 10/19/2018 2:29 PM

## 2018-10-19 NOTE — Progress Notes (Addendum)
Pt was awakened at 0450am by the announcer on the hospital intercom. Her gait was shuffled as she walked by the nurses station, complaining of the sudden noise. She requested for some pain medication for bilateral hip  pain 10/10. Prn pain medicine given per emar. Pt requested for some ice and water to take to her room and asked for her room thermostat to be reduced to 75 degrees. Pt currently back in her room. Will reassess her pain.   Pt sleeping upon pain reassessment. Temp 98.4 oral. Pt resting quietly.

## 2018-10-19 NOTE — [facility] Group Notes (Signed)
[facility] Group Notes:  (Nursing/MHT/Case Management/Adjunct)  Date:  10/19/2018  Time:  10:43 PM  Type of Therapy:  Group Therapy  Participation Level:  Active  Participation Quality:  Appropriate  Affect:  Appropriate  Cognitive:  Appropriate  Insight:  Appropriate  Engagement in Group:  Engaged  Modes of Intervention:  Discussion  Summary of Progress/Problems:  Leslie Wong 10/19/2018, 10:43 PM

## 2018-10-19 NOTE — Progress Notes (Signed)
[facility] MD Progress Note  10/19/2018 11:13 AM Leslie Wong  MRN:  [id number] Subjective:  Pt has been calm on the unit. She is sleeping much better. Her thoughts are more organized and she is much less focused on pain and somatic issues. She is tolerating Clozapine well so far. She states that she feels like she has much better energy and is thinking clearer. She has been interacting well with other peers. She is having normal bowel movements. She states taht she had 3 BMs yesterday and 1 today.   Principal Problem: Bipolar disorder with severe mania ([place]) Diagnosis:   Patient Active Problem List   Diagnosis Date Noted  . Bipolar disorder with severe mania ([place]) [F31.13]     Priority: High  . Mood change [R45.86] 10/14/2018  . Bipolar I disorder, current or most recent episode manic, with psychotic features ([place]) [F31.2] 08/28/2018  . Itch of skin [L29.9] 08/15/2018  . Oral ulcer [K12.1] 08/15/2018  . Night sweats [R61] 05/13/2018  . Pupil asymmetry [H57.02] 05/13/2018  . Hx of cold sores [Z86.19] 03/14/2018  . Parkinson disease ([place]) [G20]   . Health care maintenance [Z00.00] 06/23/2017  . HLD (hyperlipidemia) [E78.5] 03/10/2016  . Advance care planning [Z71.89] 03/10/2016  . Generalized abdominal pain [R10.84] 02/18/2015  . Osteopenia [M85.80] 04/18/2014  . Medicare annual wellness visit, initial [Z00.00] 03/18/2014  . Shoulder pain [M25.519] 01/17/2013  . Chronic back pain [M54.9, G89.29] 09/14/2011  . Hypercalcemia [E83.52] 03/25/2010  . NAUSEA WITH VOMITING [R11.2] 05/12/2009  . INCONTINENCE, URGE [N39.41] 04/16/2009  . CARPAL TUNNEL SYNDROME, BILATERAL [G56.00] 09/24/2008  . Tongue irritation [K14.9] 09/24/2008  . URETHRAL STRICTURE [N35.919] 06/13/2007  . Hypothyroidism [E03.9] 06/05/2007  . DISORDER, BIPOLAR NOS [F31.9] 06/05/2007  . MIGRAINE HEADACHE [G43.909] 06/05/2007  . GASTROPARESIS [K31.84] 06/05/2007  . Gastroesophageal reflux disease with hiatal hernia  [K21.9, K44.9] 06/05/2007  . IRRITABLE BOWEL SYNDROME [K58.9] 06/05/2007  . FIBROCYSTIC BREAST DISEASE [N60.19] 06/05/2007   Total Time spent with patient: 15 minutes  Past Psychiatric History: See H&P  Past Medical History:  Past Medical History:  Diagnosis Date  . Back pain   . Cancer ([place])    skin  . Depression    BAD  . Gastroparesis   . GERD (gastroesophageal reflux disease)   . Hypothyroidism   . Insomnia   . Migraines   . Parkinson disease ([place])    per [facility], dx'd 2019  . Recurrent cold sores   . Shoulder bursitis   . Urge incontinence     Past Surgical History:  Procedure Laterality Date  . ABDOMINAL HYSTERECTOMY  1999   total  . APPENDECTOMY    . ESOPHAGOGASTRODUODENOSCOPY  01/2006   negative except small hiatal hernia  . ESOPHAGOGASTRODUODENOSCOPY  02/24/2015   See report  . LAPAROSCOPY     for endometriosis  . OVARIAN CYST REMOVAL  1990   unilateral  . TONSILLECTOMY AND ADENOIDECTOMY     Family History:  Family History  Problem Relation Age of Onset  . Hypertension Mother   . Arthritis Mother   . Diabetes Mother   . Stroke Mother   . Cancer Father        lung  . Colon cancer Neg Hx   . Breast cancer Neg Hx    Family Psychiatric  History: See H&P Social History:  Social History   Substance and Sexual Activity  Alcohol Use No  . Alcohol/week: 0.0 standard drinks     Social History   Substance  and Sexual Activity  Drug Use No    Social History   Socioeconomic History  . Marital status: Divorced    Spouse name: Not on file  . Number of children: Not on file  . Years of education: Not on file  . Highest education level: Not on file  Occupational History  . Not on file  Social Needs  . Financial resource strain: Not on file  . Food insecurity:    Worry: Not on file    Inability: Not on file  . Transportation needs:    Medical: Not on file    Non-medical: Not on file  Tobacco Use  . Smoking status: Former Smoker     Packs/day: 1.00    Years: 42.00    Pack years: 42.00    Types: Cigarettes  . Smokeless tobacco: Former Network engineer and Sexual Activity  . Alcohol use: No    Alcohol/week: 0.0 standard drinks  . Drug use: No  . Sexual activity: Never  Lifestyle  . Physical activity:    Days per week: Not on file    Minutes per session: Not on file  . Stress: Not on file  Relationships  . Social connections:    Talks on phone: Not on file    Gets together: Not on file    Attends religious service: Not on file    Active member of club or organization: Not on file    Attends meetings of clubs or organizations: Not on file    Relationship status: Not on file  Other Topics Concern  . Not on file  Social History Narrative   Lives alone.     Has a Counsellor   Additional Social History:                         Sleep: Good  Appetite:  Good  Current Medications: Current Facility-Administered Medications  Medication Dose Route Frequency Provider Last Rate Last Dose  . acetaminophen (TYLENOL) tablet 650 mg  650 mg Oral Q6H PRN [name], [name], MD   650 mg at 10/18/18 2355  . alum & mag hydroxide-simeth (MAALOX/MYLANTA) 200-200-20 MG/5ML suspension 30 mL  30 mL Oral Q4H PRN [name], [name], MD      . benzocaine (ORAJEL) 10 % mucosal gel   Mouth/Throat QID PRN Lenward Chancellor, MD      . buPROPion (WELLBUTRIN XL) 24 hr tablet 150 mg  150 mg Oral Daily Makaveli Hoard, Tyson Babinski, MD   150 mg at 10/19/18 0848  . cloZAPine (CLOZARIL) tablet 100 mg  100 mg Oral QHS Trenten Watchman R, MD      . diphenhydrAMINE (BENADRYL) capsule 50 mg  50 mg Oral QHS PRN Ilia Dimaano R, MD      . feeding supplement (ENSURE ENLIVE) (ENSURE ENLIVE) liquid 237 mL  237 mL Oral BID BM Carina Chaplin R, MD   237 mL at 10/18/18 1425  . fluticasone (FLONASE) 50 MCG/ACT nasal spray 1 spray  1 spray Each Nare BID PRN Lenward Chancellor, MD   1 spray at 10/18/18 2132  . hydrOXYzine (ATARAX/VISTARIL) tablet 50 mg  50 mg Oral TID  PRN [name], [name], MD   50 mg at 10/18/18 2131  . lamoTRIgine (LAMICTAL) tablet 50 mg  50 mg Oral Daily Lenward Chancellor, MD   50 mg at 10/19/18 0848  . levothyroxine (SYNTHROID, LEVOTHROID) tablet 88 mcg  88 mcg Oral QAC breakfast [name], [name], MD  88 mcg at 10/19/18 0846  . lip balm (BLISTEX) ointment   Topical PRN Shiraz Bastyr, Tyson Babinski, MD      . magic mouthwash  5 mL Oral BID Detravion Tester, Tyson Babinski, MD   5 mL at 10/19/18 0847  . magnesium hydroxide (MILK OF MAGNESIA) suspension 30 mL  30 mL Oral Daily PRN [name], [name], MD   30 mL at 10/14/18 1415  . ondansetron (ZOFRAN) tablet 4 mg  4 mg Oral TID PRN Lenward Chancellor, MD   4 mg at 10/19/18 0846  . oxyCODONE-acetaminophen (PERCOCET/ROXICET) 5-325 MG per tablet 1 tablet  1 tablet Oral Q6H PRN [name], [name], MD   1 tablet at 10/19/18 0452  . pantoprazole (PROTONIX) EC tablet 40 mg  40 mg Oral Daily [name], [name], MD   40 mg at 10/19/18 0848  . tiZANidine (ZANAFLEX) tablet 4 mg  4 mg Oral TID PRN Lenward Chancellor, MD   4 mg at 10/19/18 0846  . valACYclovir (VALTREX) tablet 500 mg  500 mg Oral Daily Lenward Chancellor, MD   500 mg at 10/19/18 6160    Lab Results: No results found for this or any previous visit (from the past 56 hour(s)).  Blood Alcohol level:  Lab Results  Component Value Date   ETH 45 (H) 10/11/2018   ETH <10 73/71/0626    Metabolic Disorder Labs: Lab Results  Component Value Date   HGBA1C 5.4 10/13/2018   MPG 108.28 10/13/2018   MPG 111.15 08/29/2018   No results found for: PROLACTIN Lab Results  Component Value Date   CHOL 168 10/13/2018   TRIG 130 10/13/2018   HDL 39 (L) 10/13/2018   CHOLHDL 4.3 10/13/2018   VLDL 26 10/13/2018   LDLCALC 103 (H) 10/13/2018   LDLCALC 107 (H) 08/29/2018    Physical Findings: AIMS:  , ,  ,  ,    CIWA:    COWS:     Musculoskeletal: Strength & Muscle Tone: within normal limits Gait & Station: normal Patient leans: N/A  Psychiatric Specialty Exam: Physical Exam   Nursing note and vitals reviewed.   Review of Systems  Gastrointestinal: Negative for constipation.  All other systems reviewed and are negative.   Blood pressure 91/60, pulse 87, temperature 98.4 F (36.9 C), temperature source Oral, resp. rate 16, height 4\' 11"  (1.499 m), weight 48.5 kg, SpO2 98 %.Body mass index is 21.61 kg/m.  General Appearance: Casual  Eye Contact:  Good  Speech:  Clear and Coherent  Volume:  Normal  Mood:  Euthymic  Affect:  Congruent  Thought Process:  Coherent and Goal Directed  Orientation:  Full (Time, Place, and Person)  Thought Content:  Logical  Suicidal Thoughts:  No  Homicidal Thoughts:  No  Memory:  Immediate;   Fair  Judgement:  Fair  Insight:  Fair  Psychomotor Activity:  Normal  Concentration:  Concentration: Fair  Recall:  [employer] of Knowledge:  Fair  Language:  Fair  Akathisia:  No      Assets:  Resilience  ADL's:  Intact  Cognition:  WNL  Sleep:  Number of Hours: 6.75     Treatment Plan Summary: 62 yo female admitted due to mania and psychosis. Her thoughts are much clearer since starting Clozapine. She is much less anxious and much less focused on pain and somatic issues. We are titrating up on Clozapine with goal of 150 mg daily prior to discharge. She is tolerating so far. BP is stable.  Plan:   Plan:  Bipolar I disorder -Increase Clozapine to 100 mg qhs tonight. Will increase over the weekend as tolerated -Continue Wellbutrin XL 150 mg daily -Continue Lamictal 50 mg daily  Parkinson's disease --Confirmed with DAT scan -She follows with Dr. Renee Ramus -will refer for outpatient or home health PT  Chronic Pain She follows with [facility]  Unsteady Gait -Outpatient PT -Gait improved  Anxiety -prn hydroxyzine  Dispo -She follows with [facility] for [facility]-Rosalyn Starleen Blue. CSW to make [facility] referral to offer more support at home. I spoke with her daughter again yesterday to provide update Marylin Crosby, MD 10/19/2018, 11:13 AM

## 2018-10-19 NOTE — Progress Notes (Signed)
PT Cancellation Note  Patient Details Name: Leslie Wong MRN: [id number] DOB: Mar 30, 1956   Cancelled Treatment:    Reason Eval/Treat Not Completed: Other (comment). Treatment attempted x 2. On first attempt, was notified that patients receiving lunch. On 2nd attempt, entered patient's room and pt sleeping soundly. Did not arouse to verbal or tactile stimulation and pt kept eyes closed. Will re-attempt a different time. Per chart review, pt has been ambulating in hallway without AD with outside picking flowers.    Tela Kotecki 10/19/2018, 12:57 PM  Greggory Stallion, PT, DPT [phone]

## 2018-10-19 NOTE — Progress Notes (Signed)
DAR Note: Pt A & O to self, place and events. Denies SI, HI and AVH when assessed. C/O increased pain in neck "I wish I can have this surgery done soon because I hurt so bad honey". Ambulatory in unit with a slow but steady gait. Remains medication compliant. Pt had PT exercise done earlier this shift as well. Pt did not attend groups despite multiple prompts. Scheduled and PRN (Percocet--neck pain) medications given per order with verbal education and effects monitored. Encouraged pt to voice concerns, attend to ADLs and comply with current treatment regimen including groups. Safety checks maintained throughout this shift without verbal outburst or self harm gestures to note at this time.  Pt receptive to care. Cooperative with unit routines. Tolerates all PO intake well when offered. Reports pain relief in neck when reassessed. Safety maintained on unit.

## 2018-10-19 NOTE — Progress Notes (Signed)
Pt observed at the beginning of the shift socializing with peers and staff. She attended group session. She seemed anxious and restless ad she walked back  And forth from her room to the common area/group room. She later came by the nurses station to wave hello at me. She introduced herself when prompted and stated that she will return for her medications. Pt given her scheduled meds and prn meds for anxiety and muscle spasms with good effects noted . Pt is currently sleeping. Denies pain/SI/HI/AVH. She stated that she is going to bed and will hopes to sleep well. Will cont to monitor.

## 2018-10-19 NOTE — Plan of Care (Signed)
Pt was observed in the hallway at the beginning of the shift walking around to and from the common area to her room, nurses station and back to her room. She appeared anxious and restless, otherwise pleasant. She denies pain/SI/HI/AVH. Pt came up to me and stated hat she will be coming back for her Meds later. Will cont to monitor. Problem: Education: Goal: Emotional status will improve Outcome: Progressing Goal: Mental status will improve Outcome: Progressing Goal: Verbalization of understanding the information provided will improve Outcome: Progressing   Problem: Self-Concept: Goal: Ability to identify factors that promote anxiety will improve Outcome: Progressing Goal: Level of anxiety will decrease Outcome: Progressing Goal: Ability to modify response to factors that promote anxiety will improve Outcome: Progressing   Problem: Nutrition: Goal: Adequate nutrition will be maintained Outcome: Progressing   Problem: Coping: Goal: Level of anxiety will decrease Outcome: Progressing   Problem: Pain Managment: Goal: General experience of comfort will improve Outcome: Progressing   Problem: Safety: Goal: Ability to remain free from injury will improve Outcome: Progressing

## 2018-10-19 NOTE — Tx Team (Signed)
Interdisciplinary Treatment and Diagnostic Plan Update  10/19/2018 Time of Session: 11am Leslie Wong MRN: [id number]  Principal Diagnosis: Bipolar disorder with severe mania ([facility])  Secondary Diagnoses: Principal Problem:   Bipolar disorder with severe mania ([place])   Current Medications:  Current Facility-Administered Medications  Medication Dose Route Frequency Provider Last Rate Last Dose  . acetaminophen (TYLENOL) tablet 650 mg  650 mg Oral Q6H PRN [name], [name], MD   650 mg at 10/18/18 3614  . alum & mag hydroxide-simeth (MAALOX/MYLANTA) 200-200-20 MG/5ML suspension 30 mL  30 mL Oral Q4H PRN [name], [name], MD      . benzocaine (ORAJEL) 10 % mucosal gel   Mouth/Throat QID PRN Lenward Chancellor, MD      . buPROPion (WELLBUTRIN XL) 24 hr tablet 150 mg  150 mg Oral Daily McNew, Tyson Babinski, MD   150 mg at 10/19/18 0848  . cloZAPine (CLOZARIL) tablet 100 mg  100 mg Oral QHS McNew, Holly R, MD      . diphenhydrAMINE (BENADRYL) capsule 50 mg  50 mg Oral QHS PRN McNew, Holly R, MD      . feeding supplement (ENSURE ENLIVE) (ENSURE ENLIVE) liquid 237 mL  237 mL Oral BID BM McNew, Tyson Babinski, MD   237 mL at 10/19/18 0934  . fluticasone (FLONASE) 50 MCG/ACT nasal spray 1 spray  1 spray Each Nare BID PRN Lenward Chancellor, MD   1 spray at 10/18/18 2132  . hydrOXYzine (ATARAX/VISTARIL) tablet 50 mg  50 mg Oral TID PRN [name], [name], MD   50 mg at 10/18/18 2131  . lamoTRIgine (LAMICTAL) tablet 50 mg  50 mg Oral Daily Lenward Chancellor, MD   50 mg at 10/19/18 0848  . levothyroxine (SYNTHROID, LEVOTHROID) tablet 88 mcg  88 mcg Oral QAC breakfast [name], [name], MD   88 mcg at 10/19/18 0846  . lip balm (BLISTEX) ointment   Topical PRN McNew, Tyson Babinski, MD      . magic mouthwash  5 mL Oral BID McNew, Tyson Babinski, MD   5 mL at 10/19/18 0847  . magnesium hydroxide (MILK OF MAGNESIA) suspension 30 mL  30 mL Oral Daily PRN [name], [name], MD   30 mL at 10/14/18 1415  . ondansetron (ZOFRAN) tablet 4 mg   4 mg Oral TID PRN Lenward Chancellor, MD   4 mg at 10/19/18 0846  . oxyCODONE-acetaminophen (PERCOCET/ROXICET) 5-325 MG per tablet 1 tablet  1 tablet Oral Q6H PRN [name], [name], MD   1 tablet at 10/19/18 0452  . pantoprazole (PROTONIX) EC tablet 40 mg  40 mg Oral Daily [name], [name], MD   40 mg at 10/19/18 0848  . tiZANidine (ZANAFLEX) tablet 4 mg  4 mg Oral TID PRN Lenward Chancellor, MD   4 mg at 10/19/18 0846  . valACYclovir (VALTREX) tablet 500 mg  500 mg Oral Daily Lenward Chancellor, MD   500 mg at 10/19/18 4315   PTA Medications: Medications Prior to Admission  Medication Sig Dispense Refill Last Dose  . albuterol (PROVENTIL HFA;VENTOLIN HFA) 108 (90 Base) MCG/ACT inhaler Inhale 2 puffs into the lungs every 6 (six) hours as needed for wheezing or shortness of breath. 1 Inhaler 0 unknown at unknown  . ascorbic acid (VITAMIN C) 500 MG tablet Take 500 mg by mouth daily.   unknown at unknown  . Biotin w/ Vitamins C & E (HAIR SKIN & NAILS GUMMIES) 1250-7.5-7.5 MCG-MG-UNT CHEW Chew by mouth daily.   unknown at unknown  .  carbamazepine (TEGRETOL) 200 MG tablet Take 1 tablet (200 mg total) by mouth every 12 (twelve) hours. 60 tablet 1 unknown at unknown  . cholecalciferol (VITAMIN D) 1000 units tablet Take 1 tablet (1,000 Units total) by mouth daily.   unknown at unknown  . hydrOXYzine (VISTARIL) 25 MG capsule Take 25 mg by mouth every 6 (six) hours as needed for anxiety or itching.   unknown at unknown  . lamoTRIgine (LAMICTAL) 25 MG tablet Take 50 mg by mouth 2 (two) times daily.  0 Past Week at Unknown time  . levothyroxine (SYNTHROID, LEVOTHROID) 88 MCG tablet TAKE 1 TABLET(88 MCG) BY MOUTH DAILY (Patient taking differently: Take 88 mcg by mouth daily. ) 90 tablet 1 Past Week at Unknown time  . Multiple Vitamin (MULTIVITAMIN) tablet Take 1 tablet by mouth daily.     unknown at unknown  . Omega-3 Fatty Acids (FISH OIL) 1000 MG CAPS Take 1 capsule (1,000 mg total) by mouth daily.   unknown at  unknown  . omeprazole (PRILOSEC) 20 MG capsule TAKE 1 CAPSULE BY MOUTH EVERY MORNING 45 MINUTES BEFORE BREAKFAST (Patient taking differently: Take 20 mg by mouth daily. TAKE 1 CAPSULE BY MOUTH EVERY MORNING 45 MINUTES BEFORE BREAKFAST) 90 capsule 0 unknown at unknown  . ondansetron (ZOFRAN-ODT) 4 MG disintegrating tablet DISSOLVE 1 TABLET BY MOUTH AS NEEDED (Patient taking differently: Take 4 mg by mouth 3 (three) times daily as needed for nausea or vomiting. ) 20 tablet 0 Past Week at Unknown time  . OVER THE COUNTER MEDICATION [place] daily for menopausal symptoms.   unknown at unknown  . Oxybutynin Chloride (GELNIQUE) 10 % GEL Place onto the skin daily.     unknown at unknown  . oxyCODONE (ROXICODONE) 5 MG immediate release tablet Take 1 tablet (5 mg total) by mouth every 8 (eight) hours as needed. (Patient taking differently: Take 5 mg by mouth every 6 (six) hours as needed. ) 5 tablet 0 unknown at unknown  . paliperidone (INVEGA) 6 MG 24 hr tablet Take 6 mg by mouth at bedtime.   Past Week at Unknown time  . risperiDONE (RISPERDAL) 3 MG tablet Take 0.5 tablets (1.5 mg total) by mouth at bedtime.   Past Week at Unknown time  . temazepam (RESTORIL) 15 MG capsule Take 1 capsule (15 mg total) by mouth at bedtime. 30 capsule 0 Past Week at Unknown time  . tiZANidine (ZANAFLEX) 4 MG tablet Take 1 tablet (4 mg total) by mouth 3 (three) times daily. 30 tablet 0 unknown at unknown  . valACYclovir (VALTREX) 500 MG tablet Take 500 mg by mouth daily.    unknown at unknown  . vitamin A (VITAMIN A) 10000 UNIT capsule Take 10,000 Units by mouth daily.   unknown at unknown  . vitamin B-12 (CYANOCOBALAMIN) 1000 MCG tablet Take 1,000 mcg by mouth daily.   unknown at unknown  . vitamin E 200 UNIT capsule Take 200 Units by mouth daily.     unknown at unknown  . hydrOXYzine (ATARAX/VISTARIL) 50 MG tablet Take 1 tablet (50 mg total) by mouth 3 (three) times daily as needed for anxiety. (Patient not taking:  Reported on 10/11/2018) 90 tablet 1 Not Taking at Unknown time  . magic mouthwash SOLN Take 5 mLs by mouth 3 (three) times daily as needed for mouth pain. (Patient not taking: Reported on 10/12/2018) 200 mL 0 Not Taking at Unknown time    Patient Stressors: Health problems Medication change or noncompliance Traumatic event  Patient  Strengths: Average or above average intelligence Capable of independent living Communication skills Supportive family/friends  Treatment Modalities: Medication Management, Group therapy, Case management,  1 to 1 session with clinician, Psychoeducation, Recreational therapy.   Physician Treatment Plan for Primary Diagnosis: Bipolar disorder with severe mania ([place]) Long Term Goal(s): Improvement in symptoms so as ready for discharge Improvement in symptoms so as ready for discharge   Short Term Goals: Ability to identify changes in lifestyle to reduce recurrence of condition will improve Ability to verbalize feelings will improve Ability to disclose and discuss suicidal ideas Ability to demonstrate self-control will improve Ability to identify and develop effective coping behaviors will improve Ability to maintain clinical measurements within normal limits will improve Compliance with prescribed medications will improve Ability to identify triggers associated with substance abuse/mental health issues will improve Ability to identify changes in lifestyle to reduce recurrence of condition will improve Ability to verbalize feelings will improve Ability to disclose and discuss suicidal ideas Ability to demonstrate self-control will improve Ability to identify and develop effective coping behaviors will improve Ability to maintain clinical measurements within normal limits will improve Compliance with prescribed medications will improve Ability to identify triggers associated with substance abuse/mental health issues will improve  Medication Management:  Evaluate patient's response, side effects, and tolerance of medication regimen.  Therapeutic Interventions: 1 to 1 sessions, Unit Group sessions and Medication administration.  Evaluation of Outcomes: Progressing  Physician Treatment Plan for Secondary Diagnosis: Principal Problem:   Bipolar disorder with severe mania ([place])  Long Term Goal(s): Improvement in symptoms so as ready for discharge Improvement in symptoms so as ready for discharge   Short Term Goals: Ability to identify changes in lifestyle to reduce recurrence of condition will improve Ability to verbalize feelings will improve Ability to disclose and discuss suicidal ideas Ability to demonstrate self-control will improve Ability to identify and develop effective coping behaviors will improve Ability to maintain clinical measurements within normal limits will improve Compliance with prescribed medications will improve Ability to identify triggers associated with substance abuse/mental health issues will improve Ability to identify changes in lifestyle to reduce recurrence of condition will improve Ability to verbalize feelings will improve Ability to disclose and discuss suicidal ideas Ability to demonstrate self-control will improve Ability to identify and develop effective coping behaviors will improve Ability to maintain clinical measurements within normal limits will improve Compliance with prescribed medications will improve Ability to identify triggers associated with substance abuse/mental health issues will improve     Medication Management: Evaluate patient's response, side effects, and tolerance of medication regimen.  Therapeutic Interventions: 1 to 1 sessions, Unit Group sessions and Medication administration.  Evaluation of Outcomes: Progressing   RN Treatment Plan for Primary Diagnosis: Bipolar disorder with severe mania ([place]) Long Term Goal(s): Knowledge of disease and therapeutic regimen to maintain  health will improve  Short Term Goals: Ability to remain free from injury will improve, Ability to demonstrate self-control, Ability to identify and develop effective coping behaviors will improve and Compliance with prescribed medications will improve  Medication Management: RN will administer medications as ordered by provider, will assess and evaluate patient's response and provide education to patient for prescribed medication. RN will report any adverse and/or side effects to prescribing provider.  Therapeutic Interventions: 1 on 1 counseling sessions, Psychoeducation, Medication administration, Evaluate responses to treatment, Monitor vital signs and CBGs as ordered, Perform/monitor CIWA, COWS, AIMS and Fall Risk screenings as ordered, Perform wound care treatments as ordered.  Evaluation of Outcomes: Progressing  LCSW Treatment Plan for Primary Diagnosis: Bipolar disorder with severe mania ([place]) Long Term Goal(s): Safe transition to appropriate next level of care at discharge, Engage patient in therapeutic group addressing interpersonal concerns.  Short Term Goals: Engage patient in aftercare planning with referrals and resources, Increase social support, Increase ability to appropriately verbalize feelings, Increase emotional regulation, Identify triggers associated with mental health/substance abuse issues and Increase skills for wellness and recovery  Therapeutic Interventions: Assess for all discharge needs, 1 to 1 time with Social worker, Explore available resources and support systems, Assess for adequacy in community support network, Educate family and significant other(s) on suicide prevention, Complete Psychosocial Assessment, Interpersonal group therapy.  Evaluation of Outcomes: Progressing   Progress in Treatment: Attending groups: Yes. Participating in groups: Yes. Taking medication as prescribed: Yes. Toleration medication: Yes. Family/Significant other contact made:  Yes, individual(s) contacted:  Pt's daughter, Rennis Golden [phone] Patient understands diagnosis: Yes. Discussing patient identified problems/goals with staff: Yes. Medical problems stabilized or resolved: Yes. Denies suicidal/homicidal ideation: Yes. Issues/concerns per patient self-inventory: No. Other:   New problem(s) identified: No, Describe:  None  New Short Term/Long Term Goal(s): "To get back on medications and not use alcohol or THC again."  Patient Goals:  "To get back on medications and not use alcohol or THC again."  Discharge Plan or Barriers: To be discharged home and follow up with outpatient provider.  Reason for Continuation of Hospitalization: Medication stabilization  Estimated Length of Stay: 3-4 days   Recreational Therapy: Patient Stressors: N/A Patient Goal: Patient will engage in groups without prompting or encouragement from LRT x3 group sessions within 5 recreation therapy group sessions  Attendees: Patient:  10/19/2018 12:39 PM  Physician: Earnest Bailey McNew,MD 10/19/2018 12:39 PM  Nursing:  10/19/2018 12:39 PM  RN Care Manager: 10/19/2018 12:39 PM  Social Worker: Derrek Gu, LCSW 10/19/2018 12:39 PM  Recreational Therapist: Devin Going, LRT 10/19/2018 12:39 PM  Other:  10/19/2018 12:39 PM  Other:  10/19/2018 12:39 PM  Other: 10/19/2018 12:39 PM    Scribe for Treatment Team: Devona Konig, LCSW 10/19/2018 12:39 PM

## 2018-10-20 NOTE — Progress Notes (Signed)
Leslie Wong remained pleasant and cooperative but  Needy and focused on medications. Frequently reaching out to nursing for medications. Stayed in the dayroom and was seen playing puzzles. Patient complained of constipation but also admitted that she has a BM yesterday. Received milk of magnesia along with bedtime medication. Complained of muscle spasms and received medication. Currently in bed resting. No sign of distress. Staff continue to monitor for safety.

## 2018-10-20 NOTE — Progress Notes (Signed)
Patient ID: Leslie Wong, female   DOB: 1956/08/12, 62 y.o.   MRN: [id number] Per State regulations 482.30 this chart was reviewed for medical necessity with respect to the patient's admission/duration of stay.    Next review date: 10/24/18  Debarah Crape, BSN, RN-BC  Case Manager

## 2018-10-20 NOTE — Progress Notes (Signed)
[facility] MD Progress Note  10/20/2018 12:37 PM Arwyn Besaw  MRN:  [id number] Subjective: Patient seen chart reviewed.  This is a woman with a history of bipolar disorder who presented with psychotic manic symptoms.  She is currently being treated with clozapine which is gradually being increased.  On interview this morning the patient has no new complaints.  She denies hallucinations or delusions.  She says her mood is feeling fine.  Denies any suicidal thoughts.  Denies any feelings of any side effects from her medicine.  Looking at her vital signs her blood pressure this morning is running quite low even lower than her usual low blood pressure.  Systolics under 038 on 2 different checks with a little tachycardia. Principal Problem: Bipolar disorder with severe mania ([place]) Diagnosis:   Patient Active Problem List   Diagnosis Date Noted  . Mood change [R45.86] 10/14/2018  . Bipolar disorder with severe mania ([place]) [F31.13]   . Bipolar I disorder, current or most recent episode manic, with psychotic features ([place]) [F31.2] 08/28/2018  . Itch of skin [L29.9] 08/15/2018  . Oral ulcer [K12.1] 08/15/2018  . Night sweats [R61] 05/13/2018  . Pupil asymmetry [H57.02] 05/13/2018  . Hx of cold sores [Z86.19] 03/14/2018  . Parkinson disease ([place]) [G20]   . Health care maintenance [Z00.00] 06/23/2017  . HLD (hyperlipidemia) [E78.5] 03/10/2016  . Advance care planning [Z71.89] 03/10/2016  . Generalized abdominal pain [R10.84] 02/18/2015  . Osteopenia [M85.80] 04/18/2014  . Medicare annual wellness visit, initial [Z00.00] 03/18/2014  . Shoulder pain [M25.519] 01/17/2013  . Chronic back pain [M54.9, G89.29] 09/14/2011  . Hypercalcemia [E83.52] 03/25/2010  . NAUSEA WITH VOMITING [R11.2] 05/12/2009  . INCONTINENCE, URGE [N39.41] 04/16/2009  . CARPAL TUNNEL SYNDROME, BILATERAL [G56.00] 09/24/2008  . Tongue irritation [K14.9] 09/24/2008  . URETHRAL STRICTURE [N35.919] 06/13/2007  . Hypothyroidism  [E03.9] 06/05/2007  . DISORDER, BIPOLAR NOS [F31.9] 06/05/2007  . MIGRAINE HEADACHE [G43.909] 06/05/2007  . GASTROPARESIS [K31.84] 06/05/2007  . Gastroesophageal reflux disease with hiatal hernia [K21.9, K44.9] 06/05/2007  . IRRITABLE BOWEL SYNDROME [K58.9] 06/05/2007  . FIBROCYSTIC BREAST DISEASE [N60.19] 06/05/2007   Total Time spent with patient: 30 minutes  Past Psychiatric History: Patient has a history of bipolar disorder and has been on several medicines without a really good sustained improvement and has been getting worse with time.  Past Medical History:  Past Medical History:  Diagnosis Date  . Back pain   . Cancer ([place])    skin  . Depression    BAD  . Gastroparesis   . GERD (gastroesophageal reflux disease)   . Hypothyroidism   . Insomnia   . Migraines   . Parkinson disease ([place])    per [facility], dx'd 2019  . Recurrent cold sores   . Shoulder bursitis   . Urge incontinence     Past Surgical History:  Procedure Laterality Date  . ABDOMINAL HYSTERECTOMY  1999   total  . APPENDECTOMY    . ESOPHAGOGASTRODUODENOSCOPY  01/2006   negative except small hiatal hernia  . ESOPHAGOGASTRODUODENOSCOPY  02/24/2015   See report  . LAPAROSCOPY     for endometriosis  . OVARIAN CYST REMOVAL  1990   unilateral  . TONSILLECTOMY AND ADENOIDECTOMY     Family History:  Family History  Problem Relation Age of Onset  . Hypertension Mother   . Arthritis Mother   . Diabetes Mother   . Stroke Mother   . Cancer Father        lung  .  Colon cancer Neg Hx   . Breast cancer Neg Hx    Family Psychiatric  History: None known Social History:  Social History   Substance and Sexual Activity  Alcohol Use No  . Alcohol/week: 0.0 standard drinks     Social History   Substance and Sexual Activity  Drug Use No    Social History   Socioeconomic History  . Marital status: Divorced    Spouse name: Not on file  . Number of children: Not on file  . Years of education: Not  on file  . Highest education level: Not on file  Occupational History  . Not on file  Social Needs  . Financial resource strain: Not on file  . Food insecurity:    Worry: Not on file    Inability: Not on file  . Transportation needs:    Medical: Not on file    Non-medical: Not on file  Tobacco Use  . Smoking status: Former Smoker    Packs/day: 1.00    Years: 42.00    Pack years: 42.00    Types: Cigarettes  . Smokeless tobacco: Former Network engineer and Sexual Activity  . Alcohol use: No    Alcohol/week: 0.0 standard drinks  . Drug use: No  . Sexual activity: Never  Lifestyle  . Physical activity:    Days per week: Not on file    Minutes per session: Not on file  . Stress: Not on file  Relationships  . Social connections:    Talks on phone: Not on file    Gets together: Not on file    Attends religious service: Not on file    Active member of club or organization: Not on file    Attends meetings of clubs or organizations: Not on file    Relationship status: Not on file  Other Topics Concern  . Not on file  Social History Narrative   Lives alone.     Has a Counsellor   Additional Social History:                         Sleep: Fair  Appetite:  Fair  Current Medications: Current Facility-Administered Medications  Medication Dose Route Frequency Provider Last Rate Last Dose  . acetaminophen (TYLENOL) tablet 650 mg  650 mg Oral Q6H PRN Mariella Blackwelder, [name], MD   650 mg at 10/18/18 7628  . alum & mag hydroxide-simeth (MAALOX/MYLANTA) 200-200-20 MG/5ML suspension 30 mL  30 mL Oral Q4H PRN Adalea Handler T, MD      . benzocaine (ORAJEL) 10 % mucosal gel   Mouth/Throat QID PRN Lenward Chancellor, MD      . buPROPion (WELLBUTRIN XL) 24 hr tablet 150 mg  150 mg Oral Daily McNew, Tyson Babinski, MD   150 mg at 10/20/18 0840  . cloZAPine (CLOZARIL) tablet 100 mg  100 mg Oral QHS McNew, Tyson Babinski, MD   100 mg at 10/19/18 2135  . diphenhydrAMINE (BENADRYL) capsule 50 mg  50  mg Oral QHS PRN McNew, Tyson Babinski, MD      . feeding supplement (ENSURE ENLIVE) (ENSURE ENLIVE) liquid 237 mL  237 mL Oral BID BM McNew, Holly R, MD   237 mL at 10/19/18 1500  . fluticasone (FLONASE) 50 MCG/ACT nasal spray 1 spray  1 spray Each Nare BID PRN Lenward Chancellor, MD   1 spray at 10/20/18 0846  . hydrOXYzine (ATARAX/VISTARIL) tablet 50 mg  50 mg Oral  TID PRN Delmore Sear, [name], MD   50 mg at 10/20/18 0840  . lamoTRIgine (LAMICTAL) tablet 50 mg  50 mg Oral Daily Lenward Chancellor, MD   50 mg at 10/20/18 0840  . levothyroxine (SYNTHROID, LEVOTHROID) tablet 88 mcg  88 mcg Oral QAC breakfast Duha Abair, [name], MD   88 mcg at 10/20/18 0841  . lip balm (BLISTEX) ointment   Topical PRN McNew, Tyson Babinski, MD      . magic mouthwash  5 mL Oral BID McNew, Tyson Babinski, MD   5 mL at 10/20/18 0839  . magnesium hydroxide (MILK OF MAGNESIA) suspension 30 mL  30 mL Oral Daily PRN Arrow Emmerich, [name], MD   30 mL at 10/20/18 0839  . ondansetron (ZOFRAN) tablet 4 mg  4 mg Oral TID PRN Lenward Chancellor, MD   4 mg at 10/19/18 0846  . oxyCODONE-acetaminophen (PERCOCET/ROXICET) 5-325 MG per tablet 1 tablet  1 tablet Oral Q6H PRN Knolan Simien, [name], MD   1 tablet at 10/20/18 0507  . pantoprazole (PROTONIX) EC tablet 40 mg  40 mg Oral Daily Jaleel Allen, [name], MD   40 mg at 10/20/18 0840  . tiZANidine (ZANAFLEX) tablet 4 mg  4 mg Oral TID PRN Lenward Chancellor, MD   4 mg at 10/20/18 0840  . valACYclovir (VALTREX) tablet 500 mg  500 mg Oral Daily Lenward Chancellor, MD   500 mg at 10/20/18 7628    Lab Results: No results found for this or any previous visit (from the past 48 hour(s)).  Blood Alcohol level:  Lab Results  Component Value Date   ETH 45 (H) 10/11/2018   ETH <10 31/51/7616    Metabolic Disorder Labs: Lab Results  Component Value Date   HGBA1C 5.4 10/13/2018   MPG 108.28 10/13/2018   MPG 111.15 08/29/2018   No results found for: PROLACTIN Lab Results  Component Value Date   CHOL 168 10/13/2018   TRIG 130  10/13/2018   HDL 39 (L) 10/13/2018   CHOLHDL 4.3 10/13/2018   VLDL 26 10/13/2018   LDLCALC 103 (H) 10/13/2018   LDLCALC 107 (H) 08/29/2018    Physical Findings: AIMS:  , ,  ,  ,    CIWA:    COWS:     Musculoskeletal: Strength & Muscle Tone: within normal limits Gait & Station: normal Patient leans: N/A  Psychiatric Specialty Exam: Physical Exam  Nursing note and vitals reviewed. Constitutional: She appears well-developed and well-nourished.  HENT:  Head: Normocephalic and atraumatic.  Eyes: Pupils are equal, round, and reactive to light. Conjunctivae are normal.  Neck: Normal range of motion.  Cardiovascular: Regular rhythm and normal heart sounds.  Respiratory: Effort normal. No respiratory distress.  GI: Soft.  Musculoskeletal: Normal range of motion.  Neurological: She is alert.  Skin: Skin is warm and dry.  Psychiatric: She has a normal mood and affect. Judgment normal. Her speech is delayed. She is slowed. Thought content is not paranoid. Cognition and memory are normal. She expresses no homicidal and no suicidal ideation.    Review of Systems  Constitutional: Negative.   HENT: Negative.   Eyes: Negative.   Respiratory: Negative.   Cardiovascular: Negative.   Gastrointestinal: Negative.   Musculoskeletal: Negative.   Skin: Negative.   Neurological: Negative.   Psychiatric/Behavioral: Negative.     Blood pressure (!) 87/68, pulse (!) 108, temperature 98.4 F (36.9 C), temperature source Oral, resp. rate 18, height 4\' 11"  (1.499 m), weight 48.5 kg, SpO2 100 %.Body mass index  is 21.61 kg/m.  General Appearance: Disheveled  Eye Contact:  Fair  Speech:  Slow  Volume:  Decreased  Mood:  Depressed and Euthymic  Affect:  Constricted  Thought Process:  Coherent  Orientation:  Full (Time, Place, and Person)  Thought Content:  Logical  Suicidal Thoughts:  No  Homicidal Thoughts:  No  Memory:  Immediate;   Fair Recent;   Fair Remote;   Fair  Judgement:  Fair   Insight:  Fair  Psychomotor Activity:  Decreased  Concentration:  Concentration: Fair  Recall:  [employer] of Knowledge:  Fair  Language:  Fair  Akathisia:  No  Handed:  Right  AIMS (if indicated):     Assets:  Desire for Improvement Housing Resilience Social Support  ADL's:  Intact  Cognition:  WNL  Sleep:  Number of Hours: 6.5     Treatment Plan Summary: Daily contact with patient to assess and evaluate symptoms and progress in treatment, Medication management and Plan Patient with bipolar disorder with psychotic features who is currently on clozapine and appears to be showing some benefit.  Current clozapine dose is 100.  Dr. Wonda Olds left recommendations that the dose be increased over the weekend if the patient had normal vital signs and was not having any significant side effects.  Although the patient herself is not complaining of anything she does seem to be oversedated this morning and I noticed when I went to see her that she was drooling a bit.  More importantly her blood pressure is significantly lower this morning than what it had been previously and also she is a little tachycardic.  Based on that I am going to hold off on making any change to the clozapine.  We will reassess tomorrow.  Patient is likely still on track for discharge by Monday if symptoms stay under control.  Alethia Berthold, MD 10/20/2018, 12:37 PM

## 2018-10-20 NOTE — [facility] Group Notes (Signed)
LCSW Group Therapy Note   10/20/2018 1:15pm   Type of Therapy and Topic:  Group Therapy:  Trust and Honesty  Participation Level:  Did Not Attend  Description of Group:    In this group patients will be asked to explore the value of being honest.  Patients will be guided to discuss their thoughts, feelings, and behaviors related to honesty and trusting in others. Patients will process together how trust and honesty relate to forming relationships with peers, family members, and self. Each patient will be challenged to identify and express feelings of being vulnerable. Patients will discuss reasons why people are dishonest and identify alternative outcomes if one was truthful (to self or others). This group will be process-oriented, with patients participating in exploration of their own experiences, giving and receiving support, and processing challenge from other group members.   Therapeutic Goals: 1. Patient will identify why honesty is important to relationships and how honesty overall affects relationships.  2. Patient will identify a situation where they lied or were lied too and the  feelings, thought process, and behaviors surrounding the situation 3. Patient will identify the meaning of being vulnerable, how that feels, and how that correlates to being honest with self and others. 4. Patient will identify situations where they could have told the truth, but instead lied and explain reasons of dishonesty.   Summary of Patient Progress: Pt was invited to attend group but chose not to attend. CSW will continue to encourage pt to attend group throughout their admission.     Therapeutic Modalities:   Cognitive Behavioral Therapy Solution Focused Therapy Motivational Interviewing Brief Therapy  Ariyanah Aguado  CUEBAS-COLON, LCSW 10/20/2018 11:44 AM

## 2018-10-20 NOTE — Plan of Care (Signed)
Active in the milieu, expressing concerns as needed

## 2018-10-21 NOTE — Plan of Care (Addendum)
Patient found in community room with sister visiting upon my arrival. Patient is smiling, affect bright. Patient and her sister are laughing and happy throughout the visit. Patient denies all complaints including SI/HI/AVH. Complains of generalized muscle cramping, given Zanaflex with positive results. Reports improvement of appetite. Compliant with HS medications and staff direction. Q 15 minute checks maintained. Will continue to monitor throughout the shift. Patient slept 8 hours. No apparent distress. Will endorse care to oncoming shift.  Problem: Education: Goal: Emotional status will improve Outcome: Progressing Goal: Mental status will improve Outcome: Progressing Goal: Verbalization of understanding the information provided will improve Outcome: Progressing   Problem: Self-Concept: Goal: Level of anxiety will decrease Outcome: Progressing   Problem: Education: Goal: Knowledge of General Education information will improve Description Including pain rating scale, medication(s)/side effects and non-pharmacologic comfort measures Outcome: Progressing   Problem: Coping: Goal: Level of anxiety will decrease Outcome: Progressing

## 2018-10-21 NOTE — [facility] Group Notes (Signed)
LCSW Group Therapy Note 10/21/2018 1:15pm  Type of Therapy and Topic: Group Therapy: Feelings Around Returning Home & Establishing a Supportive Framework and Supporting Oneself When Supports Not Available  Participation Level: Active  Description of Group:  Patients first processed thoughts and feelings about upcoming discharge. These included fears of upcoming changes, lack of change, new living environments, judgements and expectations from others and overall stigma of mental health issues. The group then discussed the definition of a supportive framework, what that looks and feels like, and how do to discern it from an unhealthy non-supportive network. The group identified different types of supports as well as what to do when your family/friends are less than helpful or unavailable  Therapeutic Goals  1. Patient will identify one healthy supportive network that they can use at discharge. 2. Patient will identify one factor of a supportive framework and how to tell it from an unhealthy network. 3. Patient able to identify one coping skill to use when they do not have positive supports from others. 4. Patient will demonstrate ability to communicate their needs through discussion and/or role plays.  Summary of Patient Progress:  The patient reported she feels "wonderful." Pt engaged during group session. As patients processed their anxiety about discharge and described healthy supports patient shared she is ready to be discahrge. She reported " They got my medications right now I am ready to go back home." Patient listed her famiyl and friends as her main support. Patients identified at least one self-care tool they were willing to use after discharge;praying, calling a friend, taking care of myself.   Therapeutic Modalities Cognitive Behavioral Therapy Motivational Interviewing   Machele Deihl  CUEBAS-COLON, LCSW 10/21/2018 10:41 AM

## 2018-10-21 NOTE — Plan of Care (Signed)
Restless but cooperative per encouragements

## 2018-10-21 NOTE — Progress Notes (Signed)
[facility] MD Progress Note  10/21/2018 2:16 PM Brandelyn Henne  MRN:  [id number] Subjective: Follow-up with patient who has bipolar disorder and had been very psychotic.  Patient has no complaints today.  Says that her mood is feeling better.  Denies hallucinations.  Patient is able to carry on a lucid conversation and she is taking care of her ADLs and cleanliness without difficulty.  She says she does feel a little bit woozy at times but has not had any falls.  Blood pressure is still staying fairly low. Principal Problem: Bipolar disorder with severe mania ([place]) Diagnosis:   Patient Active Problem List   Diagnosis Date Noted  . Mood change [R45.86] 10/14/2018  . Bipolar disorder with severe mania ([place]) [F31.13]   . Bipolar I disorder, current or most recent episode manic, with psychotic features ([place]) [F31.2] 08/28/2018  . Itch of skin [L29.9] 08/15/2018  . Oral ulcer [K12.1] 08/15/2018  . Night sweats [R61] 05/13/2018  . Pupil asymmetry [H57.02] 05/13/2018  . Hx of cold sores [Z86.19] 03/14/2018  . Parkinson disease ([place]) [G20]   . Health care maintenance [Z00.00] 06/23/2017  . HLD (hyperlipidemia) [E78.5] 03/10/2016  . Advance care planning [Z71.89] 03/10/2016  . Generalized abdominal pain [R10.84] 02/18/2015  . Osteopenia [M85.80] 04/18/2014  . Medicare annual wellness visit, initial [Z00.00] 03/18/2014  . Shoulder pain [M25.519] 01/17/2013  . Chronic back pain [M54.9, G89.29] 09/14/2011  . Hypercalcemia [E83.52] 03/25/2010  . NAUSEA WITH VOMITING [R11.2] 05/12/2009  . INCONTINENCE, URGE [N39.41] 04/16/2009  . CARPAL TUNNEL SYNDROME, BILATERAL [G56.00] 09/24/2008  . Tongue irritation [K14.9] 09/24/2008  . URETHRAL STRICTURE [N35.919] 06/13/2007  . Hypothyroidism [E03.9] 06/05/2007  . DISORDER, BIPOLAR NOS [F31.9] 06/05/2007  . MIGRAINE HEADACHE [G43.909] 06/05/2007  . GASTROPARESIS [K31.84] 06/05/2007  . Gastroesophageal reflux disease with hiatal hernia [K21.9, K44.9]  06/05/2007  . IRRITABLE BOWEL SYNDROME [K58.9] 06/05/2007  . FIBROCYSTIC BREAST DISEASE [N60.19] 06/05/2007   Total Time spent with patient: 20 minutes  Past Psychiatric History: Patient has a history of recurrent episodes of psychosis  Past Medical History:  Past Medical History:  Diagnosis Date  . Back pain   . Cancer ([place])    skin  . Depression    BAD  . Gastroparesis   . GERD (gastroesophageal reflux disease)   . Hypothyroidism   . Insomnia   . Migraines   . Parkinson disease ([place])    per [facility], dx'd 2019  . Recurrent cold sores   . Shoulder bursitis   . Urge incontinence     Past Surgical History:  Procedure Laterality Date  . ABDOMINAL HYSTERECTOMY  1999   total  . APPENDECTOMY    . ESOPHAGOGASTRODUODENOSCOPY  01/2006   negative except small hiatal hernia  . ESOPHAGOGASTRODUODENOSCOPY  02/24/2015   See report  . LAPAROSCOPY     for endometriosis  . OVARIAN CYST REMOVAL  1990   unilateral  . TONSILLECTOMY AND ADENOIDECTOMY     Family History:  Family History  Problem Relation Age of Onset  . Hypertension Mother   . Arthritis Mother   . Diabetes Mother   . Stroke Mother   . Cancer Father        lung  . Colon cancer Neg Hx   . Breast cancer Neg Hx    Family Psychiatric  History: See previous note Social History:  Social History   Substance and Sexual Activity  Alcohol Use No  . Alcohol/week: 0.0 standard drinks     Social History  Substance and Sexual Activity  Drug Use No    Social History   Socioeconomic History  . Marital status: Divorced    Spouse name: Not on file  . Number of children: Not on file  . Years of education: Not on file  . Highest education level: Not on file  Occupational History  . Not on file  Social Needs  . Financial resource strain: Not on file  . Food insecurity:    Worry: Not on file    Inability: Not on file  . Transportation needs:    Medical: Not on file    Non-medical: Not on file  Tobacco Use   . Smoking status: Former Smoker    Packs/day: 1.00    Years: 42.00    Pack years: 42.00    Types: Cigarettes  . Smokeless tobacco: Former Network engineer and Sexual Activity  . Alcohol use: No    Alcohol/week: 0.0 standard drinks  . Drug use: No  . Sexual activity: Never  Lifestyle  . Physical activity:    Days per week: Not on file    Minutes per session: Not on file  . Stress: Not on file  Relationships  . Social connections:    Talks on phone: Not on file    Gets together: Not on file    Attends religious service: Not on file    Active member of club or organization: Not on file    Attends meetings of clubs or organizations: Not on file    Relationship status: Not on file  Other Topics Concern  . Not on file  Social History Narrative   Lives alone.     Has a Counsellor   Additional Social History:                         Sleep: Fair  Appetite:  Fair  Current Medications: Current Facility-Administered Medications  Medication Dose Route Frequency Provider Last Rate Last Dose  . acetaminophen (TYLENOL) tablet 650 mg  650 mg Oral Q6H PRN Daeton Kluth, [name], MD   650 mg at 10/18/18 3716  . alum & mag hydroxide-simeth (MAALOX/MYLANTA) 200-200-20 MG/5ML suspension 30 mL  30 mL Oral Q4H PRN Jayson Waterhouse T, MD      . benzocaine (ORAJEL) 10 % mucosal gel   Mouth/Throat QID PRN Lenward Chancellor, MD      . buPROPion (WELLBUTRIN XL) 24 hr tablet 150 mg  150 mg Oral Daily McNew, Tyson Babinski, MD   150 mg at 10/21/18 1202  . cloZAPine (CLOZARIL) tablet 100 mg  100 mg Oral QHS McNew, Tyson Babinski, MD   100 mg at 10/20/18 2118  . diphenhydrAMINE (BENADRYL) capsule 50 mg  50 mg Oral QHS PRN McNew, Tyson Babinski, MD      . feeding supplement (ENSURE ENLIVE) (ENSURE ENLIVE) liquid 237 mL  237 mL Oral BID BM McNew, Holly R, MD   237 mL at 10/20/18 1000  . fluticasone (FLONASE) 50 MCG/ACT nasal spray 1 spray  1 spray Each Nare BID PRN Lenward Chancellor, MD   1 spray at 10/21/18 1202  .  hydrOXYzine (ATARAX/VISTARIL) tablet 50 mg  50 mg Oral TID PRN Amdrew Oboyle, [name], MD   50 mg at 10/20/18 1619  . lamoTRIgine (LAMICTAL) tablet 50 mg  50 mg Oral Daily Lenward Chancellor, MD   50 mg at 10/21/18 1202  . levothyroxine (SYNTHROID, LEVOTHROID) tablet 88 mcg  88 mcg Oral QAC breakfast  Jamie Belger, [name], MD   88 mcg at 10/21/18 [phone]  . lip balm (BLISTEX) ointment   Topical PRN McNew, Tyson Babinski, MD      . magic mouthwash  5 mL Oral BID McNew, Tyson Babinski, MD   5 mL at 10/21/18 1202  . magnesium hydroxide (MILK OF MAGNESIA) suspension 30 mL  30 mL Oral Daily PRN Lurline Caver, [name], MD   30 mL at 10/20/18 2119  . ondansetron (ZOFRAN) tablet 4 mg  4 mg Oral TID PRN Lenward Chancellor, MD   4 mg at 10/19/18 0846  . oxyCODONE-acetaminophen (PERCOCET/ROXICET) 5-325 MG per tablet 1 tablet  1 tablet Oral Q6H PRN Camilo Mander, [name], MD   1 tablet at 10/21/18 1202  . pantoprazole (PROTONIX) EC tablet 40 mg  40 mg Oral Daily Linda Biehn, [name], MD   40 mg at 10/21/18 0644  . tiZANidine (ZANAFLEX) tablet 4 mg  4 mg Oral TID PRN Lenward Chancellor, MD   4 mg at 10/21/18 0644  . valACYclovir (VALTREX) tablet 500 mg  500 mg Oral Daily Lenward Chancellor, MD   500 mg at 10/21/18 1202    Lab Results: No results found for this or any previous visit (from the past 98 hour(s)).  Blood Alcohol level:  Lab Results  Component Value Date   ETH 45 (H) 10/11/2018   ETH <10 07/37/1062    Metabolic Disorder Labs: Lab Results  Component Value Date   HGBA1C 5.4 10/13/2018   MPG 108.28 10/13/2018   MPG 111.15 08/29/2018   No results found for: PROLACTIN Lab Results  Component Value Date   CHOL 168 10/13/2018   TRIG 130 10/13/2018   HDL 39 (L) 10/13/2018   CHOLHDL 4.3 10/13/2018   VLDL 26 10/13/2018   LDLCALC 103 (H) 10/13/2018   LDLCALC 107 (H) 08/29/2018    Physical Findings: AIMS:  , ,  ,  ,    CIWA:    COWS:     Musculoskeletal: Strength & Muscle Tone: within normal limits Gait & Station: normal Patient  leans: N/A  Psychiatric Specialty Exam: Physical Exam  Nursing note and vitals reviewed. Constitutional: She appears well-developed and well-nourished.  HENT:  Head: Normocephalic and atraumatic.  Eyes: Pupils are equal, round, and reactive to light. Conjunctivae are normal.  Neck: Normal range of motion.  Cardiovascular: Regular rhythm and normal heart sounds.  Respiratory: Effort normal. No respiratory distress.  GI: Soft.  Musculoskeletal: Normal range of motion.  Neurological: She is alert.  Skin: Skin is warm and dry.  Psychiatric: She has a normal mood and affect. Her behavior is normal. Judgment and thought content normal.    Review of Systems  Constitutional: Negative.   HENT: Negative.   Eyes: Negative.   Respiratory: Negative.   Cardiovascular: Negative.   Gastrointestinal: Negative.   Musculoskeletal: Negative.   Skin: Negative.   Neurological: Negative.   Psychiatric/Behavioral: Negative.     Blood pressure 90/63, pulse (!) 101, temperature 98.5 F (36.9 C), temperature source Oral, resp. rate 18, height 4\' 11"  (1.499 m), weight 48.5 kg, SpO2 100 %.Body mass index is 21.61 kg/m.  General Appearance: Fairly Groomed  Eye Contact:  Good  Speech:  Clear and Coherent  Volume:  Decreased  Mood:  Euthymic  Affect:  Congruent  Thought Process:  Goal Directed  Orientation:  Full (Time, Place, and Person)  Thought Content:  Logical  Suicidal Thoughts:  No  Homicidal Thoughts:  No  Memory:  Immediate;   Fair Recent;  Fair Remote;   Fair  Judgement:  Fair  Insight:  Fair  Psychomotor Activity:  Normal  Concentration:  Concentration: Fair  Recall:  [employer] of Knowledge:  Fair  Language:  Fair  Akathisia:  No  Handed:  Right  AIMS (if indicated):     Assets:  Desire for Improvement Housing Resilience Social Support  ADL's:  Intact  Cognition:  WNL  Sleep:  Number of Hours: 6.5     Treatment Plan Summary: Medication management and Plan Patient  appears to be doing well with clozapine.  I did not increase the dose yesterday because her blood pressure was staying low.  Looking at her notes today the blood pressure is still staying low and she is complaining of a little bit of dizziness.  Not looking extremely dizzy and not falling but I do not want to make things worse and since she is currently denying any psychiatric symptoms I will not increase the dose of clozapine tonight.  Patient is very much looking forward to going home tomorrow.  Encouraged her to continue with her routine and talk with Dr. Wonda Olds.  Alethia Berthold, MD 10/21/2018, 2:16 PM

## 2018-10-21 NOTE — Progress Notes (Signed)
Patient was mostly in and out of room. Frequently requesting medications. Complained of constipation, reporting that last BM was 2 days ago. Focused on pain medications: medication education provided. Patient is otherwise cooperative. Eating and sleeping well. Safety monitored as ordered.

## 2018-10-22 LAB — CBC WITH DIFFERENTIAL/PLATELET
Abs Immature Granulocytes: 0.14 10*3/uL — ABNORMAL HIGH (ref 0.00–0.07)
BASOS PCT: 0 %
Basophils Absolute: 0.1 10*3/uL (ref 0.0–0.1)
EOS ABS: 0.2 10*3/uL (ref 0.0–0.5)
Eosinophils Relative: 2 %
HCT: 40.2 % (ref 36.0–46.0)
Hemoglobin: 13 g/dL (ref 12.0–15.0)
Immature Granulocytes: 1 %
Lymphocytes Relative: 20 %
Lymphs Abs: 2.8 10*3/uL (ref 0.7–4.0)
MCH: 31.5 pg (ref 26.0–34.0)
MCHC: 32.3 g/dL (ref 30.0–36.0)
MCV: 97.3 fL (ref 80.0–100.0)
MONO ABS: 1.5 10*3/uL — AB (ref 0.1–1.0)
MONOS PCT: 11 %
NEUTROS PCT: 66 %
Neutro Abs: 9.1 10*3/uL — ABNORMAL HIGH (ref 1.7–7.7)
PLATELETS: 396 10*3/uL (ref 150–400)
RBC: 4.13 MIL/uL (ref 3.87–5.11)
RDW: 13.6 % (ref 11.5–15.5)
WBC: 13.8 10*3/uL — AB (ref 4.0–10.5)
nRBC: 0 % (ref 0.0–0.2)

## 2018-10-22 MED ORDER — BUPROPION HCL ER (XL) 150 MG PO TB24: 150.0000 mg | ORAL_TABLET | Freq: Every day | ORAL | Status: DC

## 2018-10-22 MED ORDER — HYDROXYZINE PAMOATE 25 MG PO CAPS: 25.0000 mg | ORAL_CAPSULE | Freq: Four times a day (QID) | ORAL | 0 refills | Status: DC | PRN

## 2018-10-22 MED ORDER — LAMOTRIGINE 25 MG PO TABS: 50.0000 mg | ORAL_TABLET | Freq: Every day | ORAL | 0 refills | Status: DC

## 2018-10-22 MED ORDER — CLOZAPINE 100 MG PO TABS: 100.0000 mg | ORAL_TABLET | Freq: Every day | ORAL | 0 refills | Status: DC

## 2018-10-22 NOTE — Progress Notes (Signed)
Clozapine monitoring Consult   62 yo female ordered clozapine PO  10/28 ANC 9100  Information entered into Clozapine registry and pt eligible to receive clozapine Next labs due in a week - ordered for 11/04  Pharmacy will continue to follow.   Olivia Canter, [facility] 10/22/2018 12:16 PM

## 2018-10-22 NOTE — Discharge Summary (Signed)
Physician Discharge Summary Note  Patient:  Leslie Wong is an 62 y.o., female MRN:  [id number] DOB:  Apr 07, 1956 Patient phone:  [phone] (home)  Patient address:   [place] [address],  Total Time spent with patient: 20 minutes  Plus 20 minutes of medication reconciliation, discharge planning, and discharge documentation   Date of Admission:  10/12/2018 Date of Discharge: 10/22/18  Reason for Admission:  My medicine is not right, I am having pain" Pt is 63 y/o F with h/o Bipolar d/o , admitted under IVC. Per report,  patient went to visit her primary care doctor  and was acting so strangely and was so agitated and appeared to be so confused that they had her come to the emergency room. pt endorsing depression, anxiety, labile, irritable, pressured speech, talks  about her chronic pain from her neck injury.  she has not been sleeping in days,  not been eating well prior to admission.  she has not been able to take care of her self and has not been staying back at her usual home. she reports  She was hospitalized at [facility] for a week, and released 1 week ago, states she is on lamictal 50mg  and not on tegretol, states her Op psychiatrist told her to take only 1.5 mg of Risperdal. Pt denies SI/HI. Denies AVH.  She is been back to our emergency room more than once with complaints of her pain and was actually given injections of ketamine, pt states she has pain Dr in [facility] who giving her oxycodone 5-10mg  .   Principal Problem: Bipolar disorder with severe mania [facility]) Discharge Diagnoses: Patient Active Problem List   Diagnosis Date Noted  . Bipolar disorder with severe mania ([place]) [F31.13]     Priority: High  . Mood change [R45.86] 10/14/2018  . Bipolar I disorder, current or most recent episode manic, with psychotic features ([place]) [F31.2] 08/28/2018  . Itch of skin [L29.9] 08/15/2018  . Oral ulcer [K12.1] 08/15/2018  . Night  sweats [R61] 05/13/2018  . Pupil asymmetry [H57.02] 05/13/2018  . Hx of cold sores [Z86.19] 03/14/2018  . Parkinson disease ([place]) [G20]   . Health care maintenance [Z00.00] 06/23/2017  . HLD (hyperlipidemia) [E78.5] 03/10/2016  . Advance care planning [Z71.89] 03/10/2016  . Generalized abdominal pain [R10.84] 02/18/2015  . Osteopenia [M85.80] 04/18/2014  . Medicare annual wellness visit, initial [Z00.00] 03/18/2014  . Shoulder pain [M25.519] 01/17/2013  . Chronic back pain [M54.9, G89.29] 09/14/2011  . Hypercalcemia [E83.52] 03/25/2010  . NAUSEA WITH VOMITING [R11.2] 05/12/2009  . INCONTINENCE, URGE [N39.41] 04/16/2009  . CARPAL TUNNEL SYNDROME, BILATERAL [G56.00] 09/24/2008  . Tongue irritation [K14.9] 09/24/2008  . URETHRAL STRICTURE [N35.919] 06/13/2007  . Hypothyroidism [E03.9] 06/05/2007  . DISORDER, BIPOLAR NOS [F31.9] 06/05/2007  . MIGRAINE HEADACHE [G43.909] 06/05/2007  . GASTROPARESIS [K31.84] 06/05/2007  . Gastroesophageal reflux disease with hiatal hernia [K21.9, K44.9] 06/05/2007  . IRRITABLE BOWEL SYNDROME [K58.9] 06/05/2007  . FIBROCYSTIC BREAST DISEASE [N60.19] 06/05/2007    Past Psychiatric History: See H&P  Past Medical History:  Past Medical History:  Diagnosis Date  . Back pain   . Cancer ([place])    skin  . Depression    BAD  . Gastroparesis   . GERD (gastroesophageal reflux disease)   . Hypothyroidism   . Insomnia   . Migraines   . Parkinson disease ([place])    per [place] [facility], dx'd 2019  . Recurrent cold sores   . Shoulder  bursitis   . Urge incontinence     Past Surgical History:  Procedure Laterality Date  . ABDOMINAL HYSTERECTOMY  1999   total  . APPENDECTOMY    . ESOPHAGOGASTRODUODENOSCOPY  01/2006   negative except small hiatal hernia  . ESOPHAGOGASTRODUODENOSCOPY  02/24/2015   See report  . LAPAROSCOPY     for endometriosis  . OVARIAN CYST REMOVAL  1990   unilateral  . TONSILLECTOMY AND ADENOIDECTOMY     Family History:  Family  History  Problem Relation Age of Onset  . Hypertension Mother   . Arthritis Mother   . Diabetes Mother   . Stroke Mother   . Cancer Father        lung  . Colon cancer Neg Hx   . Breast cancer Neg Hx    Family Psychiatric  History: See H&P Social History:  Social History   Substance and Sexual Activity  Alcohol Use No  . Alcohol/week: 0.0 standard drinks     Social History   Substance and Sexual Activity  Drug Use No    Social History   Socioeconomic History  . Marital status: Divorced    Spouse name: Not on file  . Number of children: Not on file  . Years of education: Not on file  . Highest education level: Not on file  Occupational History  . Not on file  Social Needs  . Financial resource strain: Not on file  . Food insecurity:    Worry: Not on file    Inability: Not on file  . Transportation needs:    Medical: Not on file    Non-medical: Not on file  Tobacco Use  . Smoking status: Former Smoker    Packs/day: 1.00    Years: 42.00    Pack years: 42.00    Types: Cigarettes  . Smokeless tobacco: Former Network engineer and Sexual Activity  . Alcohol use: No    Alcohol/week: 0.0 standard drinks  . Drug use: No  . Sexual activity: Never  Lifestyle  . Physical activity:    Days per week: Not on file    Minutes per session: Not on file  . Stress: Not on file  Relationships  . Social connections:    Talks on phone: Not on file    Gets together: Not on file    Attends religious service: Not on file    Active member of club or organization: Not on file    Attends meetings of clubs or organizations: Not on file    Relationship status: Not on file  Other Topics Concern  . Not on file  Social History Narrative   Lives alone.     Has a dog names [facility] Course:  After throughout chart review including neurology notes, she has history of Parkinson's disease. She has been trialed on many mood stabilizers and given this diagnosis most  anti-psychotics are to be avoided. She was started on Clozapine as this can be effective for psychosis and mania in someone with Parkinson's disease. She was titrated up to 100 mg qhs. Her BP was on the lower end at times so it was not increased past this. ANC was monitored. She was also restarted on Wellbutrin as pt and her daughter felt she did really well on this in the past. Symptoms improved significantly. She was much calmer, thoughts were much more organized and she was sleeping very well. She was much less perseverative on pain and somatic issues.  She was able to have a very rational conversation. She consistently denied SI or any thoughts of self harm. ON day of discharge, she felt better rested. She states that her energy is much better. She is sleeping well. She feels her medications are much better and working well. She was organized in her thoughts. NO evidence of psychosis or mania. I was in contact with her daughter through hospitalization. She denied SI or any thoughts of self harm. She will need weekly CBC levels for 6 months.   The patient is at low risk of imminent suicide. Patient denied thoughts, intent, or plan for harm to self or others, expressed significant future orientation, and expressed an ability to mobilize assistance for her needs. She is presently void of any contributing psychiatric symptoms, cognitive difficulties, or substance use which would elevate her risk for lethality. Chronic risk for lethality is elevated in light of history of bipolar disorder. The chronic risk is presently mitigated by her ongoing desire and engagement in [facility] treatment and mobilization of support from family and friends. Chronic risk may elevate if she experiences any significant loss or worsening of symptoms, which can be managed and monitored through outpatient providers. At this time,a cute risk for lethality is low and she is stable for ongoing outpatient management.   Modifiable risk factors were  addressed during this hospitalization through appropriate pharmacotherapy and establishment of outpatient follow-up treatment. Some risk factors for suicide are situational (i.e. Unstable housing) or related personality pathology (i.e. Poor coping mechanisms) and thus cannot be further mitigated by continued hospitalization in this setting.    Physical Findings: AIMS:  , ,  ,  ,    CIWA:    COWS:     Musculoskeletal: Strength & Muscle Tone: within normal limits Gait & Station: normal Patient leans: N/A  Psychiatric Specialty Exam: Physical Exam  Nursing note and vitals reviewed.   Review of Systems  All other systems reviewed and are negative.   Blood pressure 106/65, pulse 99, temperature 99.8 F (37.7 C), temperature source Oral, resp. rate 18, height 4\' 11"  (1.499 m), weight 48.5 kg, SpO2 97 %.Body mass index is 21.61 kg/m.  General Appearance: Casual  Eye Contact:  Good  Speech:  Clear and Coherent  Volume:  Normal  Mood:  Euthymic  Affect:  Appropriate and Congruent  Thought Process:  Coherent and Goal Directed  Orientation:  Full (Time, Place, and Person)  Thought Content:  Logical  Suicidal Thoughts:  No  Homicidal Thoughts:  No  Memory:  Recent;   Fair  Judgement:  Fair  Insight:  Fair  Psychomotor Activity:  Normal  Concentration:  Concentration: Fair  Recall:  [employer] of Knowledge:  Fair  Language:  Fair  Akathisia:  No      Assets:  Resilience  ADL's:  Intact  Cognition:  WNL  Sleep:  Number of Hours: 8     Have you used any form of tobacco in the last 30 days? (Cigarettes, Smokeless Tobacco, Cigars, and/or Pipes): Yes  Has this patient used any form of tobacco in the last 30 days? (Cigarettes, Smokeless Tobacco, Cigars, and/or Pipes) Yes, Yes, A prescription for an FDA-approved tobacco cessation medication was offered at discharge and the patient refused  Blood Alcohol level:  Lab Results  Component Value Date   ETH 45 (H) 10/11/2018   ETH <10  31/51/7616    Metabolic Disorder Labs:  Lab Results  Component Value Date   HGBA1C 5.4 10/13/2018  MPG 108.28 10/13/2018   MPG 111.15 08/29/2018   No results found for: PROLACTIN Lab Results  Component Value Date   CHOL 168 10/13/2018   TRIG 130 10/13/2018   HDL 39 (L) 10/13/2018   CHOLHDL 4.3 10/13/2018   VLDL 26 10/13/2018   LDLCALC 103 (H) 10/13/2018   LDLCALC 107 (H) 08/29/2018    See Psychiatric Specialty Exam and Suicide Risk Assessment completed by Attending Physician prior to discharge.  Discharge destination:  Home  Is patient on multiple antipsychotic therapies at discharge:  No   Has Patient had three or more failed trials of antipsychotic monotherapy by history:  No  Recommended Plan for Multiple Antipsychotic Therapies: NA  Discharge Instructions    Increase activity slowly   Complete by:  As directed      Allergies as of 10/22/2018      Reactions   Cymbalta [duloxetine Hcl]    Valproic Acid Shortness Of Breath   Aspirin    REACTION: UNSPECIFIED   Baclofen    REACTION: Throat swells up   Codeine    REACTION: UNSPECIFIED   Duloxetine Other (See Comments)   Vision loss, weight loss   Erythromycin    REACTION: UNSPECIFIED   Gabapentin    REACTION: throat swells up   Metoclopramide Hcl    REACTION: states "messed me up"   Nsaids Other (See Comments)   Stomach problems   Nucynta Er [tapentadol Hcl Er]    Intolerant, see note from 07/24/12.     Nucynta [tapentadol]    AMS   Prednisone    GI intolance   Pregabalin    Seroquel [quetiapine Fumerate] Other (See Comments)   Loss control, felt like on another planet   Sulfa Antibiotics Nausea And Vomiting   Topamax Other (See Comments)   Blisters in mouth and tongue   Vicodin [hydrocodone-acetaminophen]    Nausea, thrush and constipation      Medication List    STOP taking these medications   carbamazepine 200 MG tablet Commonly known as:  TEGRETOL   hydrOXYzine 50 MG tablet Commonly  known as:  ATARAX/VISTARIL   paliperidone 6 MG 24 hr tablet Commonly known as:  INVEGA   risperiDONE 3 MG tablet Commonly known as:  RISPERDAL   temazepam 15 MG capsule Commonly known as:  RESTORIL     TAKE these medications     Indication  albuterol 108 (90 Base) MCG/ACT inhaler Commonly known as:  PROVENTIL HFA;VENTOLIN HFA Inhale 2 puffs into the lungs every 6 (six) hours as needed for wheezing or shortness of breath.  Indication:  Chronic Obstructive Lung Disease   ascorbic acid 500 MG tablet Commonly known as:  VITAMIN C Take 500 mg by mouth daily.  Indication:  Inadequate Vitamin C   buPROPion 150 MG 24 hr tablet Commonly known as:  WELLBUTRIN XL Take 1 tablet (150 mg total) by mouth daily.  Indication:  Major Depressive Disorder   cholecalciferol 1000 units tablet Commonly known as:  VITAMIN D Take 1 tablet (1,000 Units total) by mouth daily.  Indication:  osteopenia   cloZAPine 100 MG tablet Commonly known as:  CLOZARIL Take 1 tablet (100 mg total) by mouth at bedtime.  Indication:  Bipolar disorder   Fish Oil 1000 MG Caps Take 1 capsule (1,000 mg total) by mouth daily.  Indication:  bipolar disorder   GELNIQUE 10 % Gel Generic drug:  Oxybutynin Chloride Place onto the skin daily.  Indication:  Urinary Incontinence   HAIR SKIN &  NAILS GUMMIES 1250-7.5-7.5 MCG-MG-UNT Chew Generic drug:  Biotin w/ Vitamins C & E Chew by mouth daily.  Indication:  general health   hydrOXYzine 25 MG capsule Commonly known as:  VISTARIL Take 1 capsule (25 mg total) by mouth every 6 (six) hours as needed for anxiety or itching.  Indication:  Feeling Anxious   lamoTRIgine 25 MG tablet Commonly known as:  LAMICTAL Take 2 tablets (50 mg total) by mouth daily. What changed:  when to take this  Indication:  Mood   levothyroxine 88 MCG tablet Commonly known as:  SYNTHROID, LEVOTHROID TAKE 1 TABLET(88 MCG) BY MOUTH DAILY What changed:    how much to take  how to take  this  when to take this  additional instructions  Indication:  Underactive Thyroid   magic mouthwash Soln Take 5 mLs by mouth 3 (three) times daily as needed for mouth pain.  Indication:  dry mouth   multivitamin tablet Take 1 tablet by mouth daily.  Indication:  general health   omeprazole 20 MG capsule Commonly known as:  PRILOSEC TAKE 1 CAPSULE BY MOUTH EVERY MORNING 45 MINUTES BEFORE BREAKFAST What changed:  See the new instructions.  Indication:  Gastroesophageal Reflux Disease   ondansetron 4 MG disintegrating tablet Commonly known as:  ZOFRAN-ODT DISSOLVE 1 TABLET BY MOUTH AS NEEDED What changed:  See the new instructions.  Indication:  Nausea and Vomiting   OVER THE COUNTER MEDICATION [place] daily for menopausal symptoms.  Indication:  menopause   oxyCODONE 5 MG immediate release tablet Commonly known as:  Oxy IR/ROXICODONE Take 1 tablet (5 mg total) by mouth every 8 (eight) hours as needed. What changed:  when to take this  Indication:  Chronic Pain   tiZANidine 4 MG tablet Commonly known as:  ZANAFLEX Take 1 tablet (4 mg total) by mouth 3 (three) times daily.  Indication:  Muscle Spasticity   valACYclovir 500 MG tablet Commonly known as:  VALTREX Take 500 mg by mouth daily.  Indication:  Herpes Simplex Infection   vitamin A 10000 UNIT capsule Generic drug:  vitamin A Take 10,000 Units by mouth daily.  Indication:  Vitamin A Deficiency   vitamin B-12 1000 MCG tablet Commonly known as:  CYANOCOBALAMIN Take 1,000 mcg by mouth daily.  Indication:  Inadequate Vitamin B12   vitamin E 200 UNIT capsule Take 200 Units by mouth daily.  Indication:  Deficiency of Vitamin E      Follow-up Information    [facility] [place] [facility] up on 10/29/2018.   Why:  Your hospital follow-up appointment is scheduled for 10/29/18 at 11:20AM with Dr. Verl Blalock. Contact information: [place] [address] [phone]            Follow-up recommendations: Will need weekly CBC. Next due on 10/29/18  Signed: Marylin Crosby, MD 10/22/2018, 8:56 AM

## 2018-10-22 NOTE — Discharge Instructions (Signed)
Next Blood draw for CBC due on 10/29/18. You may have this completed either at your PCP office or at [facility].

## 2018-10-22 NOTE — Progress Notes (Addendum)
D: Patient is aware of  Discharge this shift .Patient denies suicidal /homicidal ideations. Patient received all belongings brought in  A: No Storage medications. Writer reviewed Discharge Summary, Suicide Risk Assessment, and Transitional Record. Patient also received Prescriptions   from  MD. Aware  Of follow up appointment . R: Patient left unit with no questions  Or concerns  With daugther

## 2018-10-22 NOTE — BHH Suicide Risk Assessment (Signed)
[facility] Discharge Suicide Risk Assessment   Principal Problem: Bipolar disorder with severe mania [facility]) Discharge Diagnoses:  Patient Active Problem List   Diagnosis Date Noted  . Bipolar disorder with severe mania ([place]) [F31.13]     Priority: High  . Mood change [R45.86] 10/14/2018  . Bipolar I disorder, current or most recent episode manic, with psychotic features ([place]) [F31.2] 08/28/2018  . Itch of skin [L29.9] 08/15/2018  . Oral ulcer [K12.1] 08/15/2018  . Night sweats [R61] 05/13/2018  . Pupil asymmetry [H57.02] 05/13/2018  . Hx of cold sores [Z86.19] 03/14/2018  . Parkinson disease ([place]) [G20]   . Health care maintenance [Z00.00] 06/23/2017  . HLD (hyperlipidemia) [E78.5] 03/10/2016  . Advance care planning [Z71.89] 03/10/2016  . Generalized abdominal pain [R10.84] 02/18/2015  . Osteopenia [M85.80] 04/18/2014  . Medicare annual wellness visit, initial [Z00.00] 03/18/2014  . Shoulder pain [M25.519] 01/17/2013  . Chronic back pain [M54.9, G89.29] 09/14/2011  . Hypercalcemia [E83.52] 03/25/2010  . NAUSEA WITH VOMITING [R11.2] 05/12/2009  . INCONTINENCE, URGE [N39.41] 04/16/2009  . CARPAL TUNNEL SYNDROME, BILATERAL [G56.00] 09/24/2008  . Tongue irritation [K14.9] 09/24/2008  . URETHRAL STRICTURE [N35.919] 06/13/2007  . Hypothyroidism [E03.9] 06/05/2007  . DISORDER, BIPOLAR NOS [F31.9] 06/05/2007  . MIGRAINE HEADACHE [G43.909] 06/05/2007  . GASTROPARESIS [K31.84] 06/05/2007  . Gastroesophageal reflux disease with hiatal hernia [K21.9, K44.9] 06/05/2007  . IRRITABLE BOWEL SYNDROME [K58.9] 06/05/2007  . FIBROCYSTIC BREAST DISEASE [N60.19] 06/05/2007    Mental Status Per Nursing Assessment::   On Admission:  NA  Demographic Factors:  Caucasian, Living alone and Unemployed  Loss Factors: Decrease in vocational status  Historical Factors: Impulsivity  Risk Reduction Factors:   Positive social support, Positive therapeutic relationship and Positive coping skills or problem  solving skills  Continued Clinical Symptoms:  Previous Psychiatric Diagnoses and Treatments  Cognitive Features That Contribute To Risk:  None    Suicide Risk:  Minimal Acute RIsk: No identifiable suicidal ideation.  Patients presenting with no risk factors but with morbid ruminations; may be classified as minimal risk based on the severity of the depressive symptoms  Follow-up Information    [facility] [place] [facility] up on 10/29/2018.   Why:  Your hospital follow-up appointment is scheduled for 10/29/18 at 11:20AM with Dr. Verl Blalock. Contact information: [place] [address] [phone]            Marylin Crosby, MD 10/22/2018, 8:55 AM

## 2018-10-22 NOTE — Progress Notes (Signed)
Recreation Therapy Notes   Date: 10/22/2018  Time: 9:30 am   Location: Craft room   Behavioral response: N/A   Intervention Topic: Coping  Discussion/Intervention: Patient did not attend group.   Clinical Observations/Feedback:  Patient did not attend group.   Magdelene Ruark LRT/CTRS        Luiz Trumpower 10/22/2018 11:50 AM

## 2018-10-22 NOTE — Progress Notes (Signed)
Recreation Therapy Notes  INPATIENT RECREATION TR PLAN  Patient Details Name: Leslie Wong MRN: [id number] DOB: 1956-06-01 Today's Date: 10/22/2018  Rec Therapy Plan Is patient appropriate for Therapeutic Recreation?: Yes Treatment times per week: at least 3 Estimated Length of Stay: 5-7 days TR Treatment/Interventions: Group participation (Comment)  Discharge Criteria Pt will be discharged from therapy if:: Discharged Treatment plan/goals/alternatives discussed and agreed upon by:: Patient/family  Discharge Summary Short term goals set: Patient will engage in groups without prompting or encouragement from LRT x3 group sessions within 5 recreation therapy group sessions Short term goals met: Not met Reason goals not met: Patient did not attend any groups Therapeutic equipment acquired: N/A Reason patient discharged from therapy: Discharge from hospital Pt/family agrees with progress & goals achieved: Yes Date patient discharged from therapy: 10/22/18   Sartaj Hoskin 10/22/2018, 11:52 AM

## 2018-10-22 NOTE — Progress Notes (Signed)
  [facility] Adult Case Management Discharge Plan :  Will you be returning to the same living situation after discharge:  Yes,  pt lives alone At discharge, do you have transportation home?: Yes,  daughter will provide transportation Do you have the ability to pay for your medications: Yes,  insurance  Release of information consent forms completed and in the chart;  Patient's signature needed at discharge.  Patient to Follow up at: Follow-up Information    [facility] [place] [facility] up on 10/29/2018.   Why:  Your hospital follow-up appointment is scheduled for 10/29/18 at 11:20AM with Dr. Verl Blalock. Contact information: [place] [address] [phone]           Next level of care provider has access to [place] and Suicide Prevention discussed: Clarita Leber, daughter  Have you used any form of tobacco in the last 30 days? (Cigarettes, Smokeless Tobacco, Cigars, and/or Pipes): Yes  Has patient been referred to the Quitline?: Patient refused referral  Patient has been referred for addiction treatment: N/A  Yvette Rack, LCSW 10/22/2018, 9:08 AM

## 2018-10-23 ENCOUNTER — Telehealth: Payer: Self-pay

## 2018-10-23 NOTE — Care Management (Signed)
RNCM made multiple attempts to get OP PT orders signed. I had to leave orders there the day of discharge and wait for discharging physician to sign them and pick them up today. Faxed OP PT orders to [place] at [phone].

## 2018-10-23 NOTE — Telephone Encounter (Signed)
Left message for Ms. Palmer, patient's daughter, to call me back to follow up on patient and do TCM call.

## 2018-10-24 ENCOUNTER — Other Ambulatory Visit: Payer: Self-pay | Admitting: *Deleted

## 2018-10-24 ENCOUNTER — Telehealth: Payer: Self-pay

## 2018-10-24 DIAGNOSIS — M5412 Radiculopathy, cervical region: Secondary | ICD-10-CM

## 2018-10-24 NOTE — Patient Outreach (Signed)
[facility]) Care Management  10/24/2018  Leslie Wong 06/01/1956 [id number]   Telephone assessment call   Referral received : 10/16/18 Referral source: [facility]-,Dr.McNew Referral reason : inpatient admission 10/18-10/28, Dx: Bipolar with severe mania,IVC, multiple ED visits and admission, multiple medical conditions and needs extra support at home .  Insurance: Medicare   PMHx; includes but not limited to , Chronic back pain, Bipolar disorder with severe mania, migraine headache, GERD,Parkinson disease,    Initial telephone outreach call to patient , no answer able to leave a HIPAA compliant message for return call.   Plan  Will await return call , if no response will plan return call in the next 4 business days.  Will send unsuccessful outreach letter.    [name], RN, [place] Management Coordinator  [phone]- Mobile [phone]- Toll Free Main Office

## 2018-10-24 NOTE — Telephone Encounter (Signed)
Spoke with patient and did hospital follow up (see other telephone note). Patient wanted to tell you thank you for everything you did and she is feeling better.  Patient does request a referral to Dr Deetta Perla -neurosurgeon at [facility], for pinched nerve that she was diagnosed with in April by Dr Harland Dingwall at [facility]. Can we order this or does she need to call Dr Prescott Parma? Please review. Thank you CB: [phone] (H)

## 2018-10-24 NOTE — Telephone Encounter (Signed)
Transition Care Management Follow-up Telephone Call   Date discharged? 10/22/2018   How have you been since you were released from the hospital? Doing ok. Still having issues with sweating.   Do you understand why you were in the hospital? Yes   Do you understand the discharge instructions? Yes   Where were you discharged to? Home   Items Reviewed:  Medications reviewed: yes  Allergies reviewed: yes  Dietary changes reviewed: Yes-none were made  Referrals reviewed: Yes- follow up appt with psychiatrist on 10/29/2018 with Dr Rubin Payor (?) patient thinks this is the right name. At [facility].   Functional Questionnaire:   Activities of Daily Living (ADLs):   She states they are independent in the following: ambulation, feeding, grooming, hygiene, toileting, bathing, continence. States they require assistance with the following: none-patient able to get things done independently.   Any transportation issues/concerns?: No.   Any patient concerns? No.   Confirmed importance and date/time of follow-up visits scheduled Yes.  Provider Appointment booked with psychiatrist on 10/29/2018.  Confirmed with patient if condition begins to worsen call PCP or go to the ER.  Patient was given the office number and encouraged to call back with question or concerns.  : Yes

## 2018-10-25 NOTE — Telephone Encounter (Signed)
Patient advised that referral is been sent over to [facility] and their office will call her directly unless there is an issue with referral-Jackalyn Haith V Keely Drennan, RMA

## 2018-10-25 NOTE — Telephone Encounter (Signed)
I put in the referral.  Thanks.  

## 2018-10-27 ENCOUNTER — Other Ambulatory Visit: Payer: Self-pay | Admitting: Family Medicine

## 2018-10-29 ENCOUNTER — Other Ambulatory Visit: Payer: Self-pay | Admitting: *Deleted

## 2018-10-29 NOTE — Telephone Encounter (Signed)
Electronic refill request. Ondansetron Last office visit:   10/11/18 Last Filled:    20 tablet 0 09/24/2018  Please advise.

## 2018-10-29 NOTE — Patient Outreach (Signed)
[facility]) Care Management  10/29/2018  Leslie Wong 12-06-1956 [id number]   Telephone call attempt #2  Referral received : 10/16/18 Referral source: [facility]-,Dr.McNew Referral reason : inpatient admission 10/18-10/28, Dx: Bipolar with severe mania,IVC, multiple ED visits and admission, multiple medical conditions and needs extra support at home .  Insurance: Medicare   PMHx; includes but not limited to , Chronic back pain, Bipolar disorder with severe mania, migraine headache, GERD,Parkinson disease,   Telephone outreach call to patient, no answer allowed phone to ring greater than 10 times, no answering machine pick up.    Plan  Will schedule #3 call attempt in the next 4 days.    [name], RN, [place] Management Coordinator  [phone]- Mobile [phone]- Toll Free Main Office

## 2018-10-30 NOTE — Telephone Encounter (Signed)
Sent. Thanks.   

## 2018-11-01 ENCOUNTER — Other Ambulatory Visit: Payer: Self-pay | Admitting: *Deleted

## 2018-11-01 NOTE — Patient Outreach (Signed)
[facility]) Care Management  11/01/2018  Leslie Wong 1956/09/28 [id number]   Telephone assessment   Call attempt #3  Unsuccessful call attempt, no answer able to leave a HIPAA compliant message for return call. No response from outreach letter sent.    Plan Will await return call ,if no response  Will place on schedule for case closure by day #10   [name], RN, [place] Management Coordinator  [phone]- Mobile [phone]- [place]

## 2018-11-06 ENCOUNTER — Other Ambulatory Visit: Payer: Self-pay | Admitting: *Deleted

## 2018-11-06 DIAGNOSIS — F17200 Nicotine dependence, unspecified, uncomplicated: Secondary | ICD-10-CM | POA: Diagnosis not present

## 2018-11-06 DIAGNOSIS — F4310 Post-traumatic stress disorder, unspecified: Secondary | ICD-10-CM | POA: Diagnosis not present

## 2018-11-06 NOTE — Patient Outreach (Signed)
[place] [facility]) Care Management  11/06/2018  Leslie Wong March 10, 1956 [id number]   Case Closure   Unsuccessful call attempt x 3, no response from unsuccessful outreach letter sent to patient .   Plan Will close case , per workflow.  [name], RN, [place] Management Coordinator  [phone]- Mobile [phone]- Toll Free Main Office

## 2018-11-09 DIAGNOSIS — F4310 Post-traumatic stress disorder, unspecified: Secondary | ICD-10-CM | POA: Diagnosis not present

## 2018-11-09 DIAGNOSIS — F17200 Nicotine dependence, unspecified, uncomplicated: Secondary | ICD-10-CM | POA: Diagnosis not present

## 2018-11-13 ENCOUNTER — Other Ambulatory Visit: Payer: Self-pay | Admitting: Student

## 2018-11-13 DIAGNOSIS — M5412 Radiculopathy, cervical region: Secondary | ICD-10-CM | POA: Diagnosis not present

## 2018-11-14 DIAGNOSIS — M50323 Other cervical disc degeneration at C6-C7 level: Secondary | ICD-10-CM | POA: Diagnosis not present

## 2018-11-14 DIAGNOSIS — M4722 Other spondylosis with radiculopathy, cervical region: Secondary | ICD-10-CM | POA: Diagnosis not present

## 2018-11-19 DIAGNOSIS — G20 Parkinson's disease: Secondary | ICD-10-CM | POA: Diagnosis not present

## 2018-11-19 DIAGNOSIS — F3170 Bipolar disorder, currently in remission, most recent episode unspecified: Secondary | ICD-10-CM | POA: Diagnosis not present

## 2018-11-19 DIAGNOSIS — T43505A Adverse effect of unspecified antipsychotics and neuroleptics, initial encounter: Secondary | ICD-10-CM | POA: Diagnosis not present

## 2018-11-19 DIAGNOSIS — G2401 Drug induced subacute dyskinesia: Secondary | ICD-10-CM | POA: Diagnosis not present

## 2018-11-19 DIAGNOSIS — G3184 Mild cognitive impairment, so stated: Secondary | ICD-10-CM | POA: Diagnosis not present

## 2018-11-20 ENCOUNTER — Other Ambulatory Visit: Payer: Self-pay | Admitting: Family Medicine

## 2018-11-30 DIAGNOSIS — F312 Bipolar disorder, current episode manic severe with psychotic features: Secondary | ICD-10-CM | POA: Diagnosis not present

## 2018-11-30 DIAGNOSIS — F411 Generalized anxiety disorder: Secondary | ICD-10-CM | POA: Diagnosis not present

## 2018-11-30 DIAGNOSIS — G20 Parkinson's disease: Secondary | ICD-10-CM | POA: Diagnosis not present

## 2018-11-30 DIAGNOSIS — F062 Psychotic disorder with delusions due to known physiological condition: Secondary | ICD-10-CM | POA: Diagnosis not present

## 2018-12-04 ENCOUNTER — Ambulatory Visit: Payer: Medicare Other

## 2018-12-07 DIAGNOSIS — Z87891 Personal history of nicotine dependence: Secondary | ICD-10-CM | POA: Diagnosis not present

## 2018-12-07 DIAGNOSIS — M545 Low back pain: Secondary | ICD-10-CM | POA: Diagnosis not present

## 2018-12-07 DIAGNOSIS — Z720 Tobacco use: Secondary | ICD-10-CM | POA: Diagnosis not present

## 2018-12-07 DIAGNOSIS — M1288 Other specific arthropathies, not elsewhere classified, other specified site: Secondary | ICD-10-CM | POA: Diagnosis not present

## 2018-12-07 DIAGNOSIS — Z716 Tobacco abuse counseling: Secondary | ICD-10-CM | POA: Diagnosis not present

## 2018-12-07 DIAGNOSIS — M5136 Other intervertebral disc degeneration, lumbar region: Secondary | ICD-10-CM | POA: Diagnosis not present

## 2018-12-07 DIAGNOSIS — M25561 Pain in right knee: Secondary | ICD-10-CM | POA: Diagnosis not present

## 2018-12-07 DIAGNOSIS — F17200 Nicotine dependence, unspecified, uncomplicated: Secondary | ICD-10-CM | POA: Diagnosis not present

## 2018-12-31 DIAGNOSIS — F062 Psychotic disorder with delusions due to known physiological condition: Secondary | ICD-10-CM | POA: Diagnosis not present

## 2018-12-31 DIAGNOSIS — F312 Bipolar disorder, current episode manic severe with psychotic features: Secondary | ICD-10-CM | POA: Diagnosis not present

## 2018-12-31 DIAGNOSIS — G20 Parkinson's disease: Secondary | ICD-10-CM | POA: Diagnosis not present

## 2018-12-31 DIAGNOSIS — F411 Generalized anxiety disorder: Secondary | ICD-10-CM | POA: Diagnosis not present

## 2018-12-31 DIAGNOSIS — Z79899 Other long term (current) drug therapy: Secondary | ICD-10-CM | POA: Diagnosis not present

## 2019-01-01 ENCOUNTER — Ambulatory Visit
Admission: RE | Admit: 2019-01-01 | Discharge: 2019-01-01 | Disposition: A | Discharge status: Home / Self Care | Payer: Medicare Other | Source: Ambulatory Visit | Attending: Student | Admitting: Student

## 2019-01-01 ENCOUNTER — Encounter (INDEPENDENT_AMBULATORY_CARE_PROVIDER_SITE_OTHER): Payer: Self-pay

## 2019-01-01 DIAGNOSIS — M5412 Radiculopathy, cervical region: Secondary | ICD-10-CM | POA: Diagnosis not present

## 2019-01-01 DIAGNOSIS — M48061 Spinal stenosis, lumbar region without neurogenic claudication: Secondary | ICD-10-CM | POA: Diagnosis not present

## 2019-01-21 DIAGNOSIS — F411 Generalized anxiety disorder: Secondary | ICD-10-CM | POA: Diagnosis not present

## 2019-01-21 DIAGNOSIS — F312 Bipolar disorder, current episode manic severe with psychotic features: Secondary | ICD-10-CM | POA: Diagnosis not present

## 2019-01-21 DIAGNOSIS — G20 Parkinson's disease: Secondary | ICD-10-CM | POA: Diagnosis not present

## 2019-01-21 DIAGNOSIS — F062 Psychotic disorder with delusions due to known physiological condition: Secondary | ICD-10-CM | POA: Diagnosis not present

## 2019-01-24 ENCOUNTER — Ambulatory Visit: Payer: Self-pay | Admitting: Family Medicine

## 2019-01-25 ENCOUNTER — Telehealth: Payer: Self-pay

## 2019-01-25 ENCOUNTER — Ambulatory Visit (INDEPENDENT_AMBULATORY_CARE_PROVIDER_SITE_OTHER): Payer: Medicare Other | Admitting: Family Medicine

## 2019-01-25 ENCOUNTER — Encounter: Payer: Self-pay | Admitting: Family Medicine

## 2019-01-25 VITALS — BP 126/70 | HR 98 | Temp 99.1°F | Ht 59.0 in

## 2019-01-25 DIAGNOSIS — M549 Dorsalgia, unspecified: Secondary | ICD-10-CM | POA: Diagnosis not present

## 2019-01-25 DIAGNOSIS — Z8619 Personal history of other infectious and parasitic diseases: Secondary | ICD-10-CM

## 2019-01-25 DIAGNOSIS — L989 Disorder of the skin and subcutaneous tissue, unspecified: Secondary | ICD-10-CM | POA: Diagnosis not present

## 2019-01-25 DIAGNOSIS — G8929 Other chronic pain: Secondary | ICD-10-CM

## 2019-01-25 DIAGNOSIS — E039 Hypothyroidism, unspecified: Secondary | ICD-10-CM | POA: Diagnosis not present

## 2019-01-25 MED ORDER — VALACYCLOVIR HCL 500 MG PO TABS: 500.0000 mg | ORAL_TABLET | Freq: Two times a day (BID) | ORAL | 1 refills | Status: DC | PRN

## 2019-01-25 MED ORDER — TIZANIDINE HCL 4 MG PO TABS: 4.0000 mg | ORAL_TABLET | Freq: Three times a day (TID) | ORAL | 1 refills | Status: DC

## 2019-01-25 MED ORDER — LEVOTHYROXINE SODIUM 88 MCG PO TABS: 88.0000 ug | ORAL_TABLET | Freq: Every day | ORAL | 1 refills | Status: DC

## 2019-01-25 MED ORDER — TRIAMCINOLONE ACETONIDE 0.1 % EX CREA: 1.0000 "application " | TOPICAL_CREAM | Freq: Two times a day (BID) | CUTANEOUS | 0 refills | Status: DC

## 2019-01-25 NOTE — Progress Notes (Signed)
"  Sores" on the R shoulder and near R ankle.  She wanted both evaluated. She has diffuse cracking on the fingers and wanted to see the skin clinic.   She needed refill on tizanidine, levothyroxine, and valtrex.  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  She is used tizanidine for muscle spasms without adverse effect and she is compliant with her thyroid medication.  Meds, vitals, and allergies reviewed.   ROS: Per HPI unless specifically indicated in ROS section   GEN: nad, alert and oriented HEENT: mucous membranes moist NECK: supple w/o LA CV: rrr PULM: ctab, no inc wob EXT: no edema SKIN: Dry cracked skin noted on the fingertips bilaterally. 1 cm scabbed lesion near posterior R axilla.  This looks like a benign lesion that should heal on its own. 1 cm nonacute blister near R lateral malleolus.  Covered with hydrocolloid dressing at the office visit.  None of the areas appear infected.

## 2019-01-25 NOTE — Telephone Encounter (Signed)
Call pt regarding lung screening. Left message for pt to return call.  

## 2019-01-25 NOTE — Patient Instructions (Addendum)
Use a hydrocolloid dressing on your ankle.  That should heal.  The spot near your right shoulder looks like it is healing.  Use triamcinolone on the cracked skin and see if that helps.   We make arrangements for referrals, extra imaging, and other appointments based on the urgency of the situation. Referrals are handled based on the clinical situation, not in the order that they are placed. If you do not see one of our referral coordinators on the way out of the clinic today, then you should expect a call in the next 1 to 2 weeks. We work diligently to process all referrals as quickly as possible.    I sent your meds to the pharmacy.  Take care.  Glad to see you.

## 2019-01-26 ENCOUNTER — Telehealth: Payer: Self-pay

## 2019-01-26 NOTE — Telephone Encounter (Signed)
Call pt regarding lung screening. Pt is current smoker, smoking about 1/2 pack per day. Pt would like scan to be around 11 am on any day. Pt states there was black mold found in her apartment. Denies any new health issues at this time.

## 2019-01-27 DIAGNOSIS — L989 Disorder of the skin and subcutaneous tissue, unspecified: Secondary | ICD-10-CM | POA: Insufficient documentation

## 2019-01-27 NOTE — Assessment & Plan Note (Signed)
Can use Valtrex as needed.

## 2019-01-27 NOTE — Assessment & Plan Note (Signed)
Continue as is.  Compliant.

## 2019-01-27 NOTE — Assessment & Plan Note (Signed)
She can use tizanidine as needed.  No adverse effect on medication previously.

## 2019-01-27 NOTE — Assessment & Plan Note (Signed)
Reasonable to try triamcinolone cream in the meantime.  She want to see the skin clinic.  Referred.  She can use a hydrocolloid dressing on the blister on her ankle.  That and the other skin lesion near her right axilla should heal well in the meantime.

## 2019-01-29 ENCOUNTER — Telehealth: Payer: Self-pay | Admitting: *Deleted

## 2019-01-29 ENCOUNTER — Encounter: Payer: Self-pay | Admitting: *Deleted

## 2019-01-29 DIAGNOSIS — Z122 Encounter for screening for malignant neoplasm of respiratory organs: Secondary | ICD-10-CM

## 2019-01-29 NOTE — Telephone Encounter (Signed)
Patient has been notified that the annual lung cancer screening low dose CT scan is due currently or will be in the near future.  Confirmed that the patient is within the age range of 24-80, and asymptomatic, and currently exhibits no signs or symptoms of lung cancer.  Patient denies illness that would prevent curative treatment for lung cancer if found.  Verified smoking history, current smoker 0.5ppd with 42.5pkyr history.  The shared decision making visit was completed on 02-06-18.  Patient is agreeable for the CT scan to be scheduled.  Will call patient back with date and time of appointment.

## 2019-01-30 ENCOUNTER — Ambulatory Visit: Payer: Self-pay

## 2019-01-30 ENCOUNTER — Telehealth: Payer: Self-pay | Admitting: *Deleted

## 2019-01-30 NOTE — Telephone Encounter (Signed)
Called pt to inform her of her appt for ldct screening on Friday 02/08/2019 @ 11:10am here @ [facility], message left for pt, appt mailed.

## 2019-01-31 DIAGNOSIS — Z87891 Personal history of nicotine dependence: Secondary | ICD-10-CM | POA: Diagnosis not present

## 2019-01-31 DIAGNOSIS — Z5181 Encounter for therapeutic drug level monitoring: Secondary | ICD-10-CM | POA: Diagnosis not present

## 2019-01-31 DIAGNOSIS — Z720 Tobacco use: Secondary | ICD-10-CM | POA: Diagnosis not present

## 2019-01-31 DIAGNOSIS — G894 Chronic pain syndrome: Secondary | ICD-10-CM | POA: Diagnosis not present

## 2019-01-31 DIAGNOSIS — M5136 Other intervertebral disc degeneration, lumbar region: Secondary | ICD-10-CM | POA: Diagnosis not present

## 2019-01-31 DIAGNOSIS — Z716 Tobacco abuse counseling: Secondary | ICD-10-CM | POA: Diagnosis not present

## 2019-01-31 DIAGNOSIS — Z79891 Long term (current) use of opiate analgesic: Secondary | ICD-10-CM | POA: Diagnosis not present

## 2019-01-31 DIAGNOSIS — M1288 Other specific arthropathies, not elsewhere classified, other specified site: Secondary | ICD-10-CM | POA: Diagnosis not present

## 2019-01-31 DIAGNOSIS — F17200 Nicotine dependence, unspecified, uncomplicated: Secondary | ICD-10-CM | POA: Diagnosis not present

## 2019-01-31 DIAGNOSIS — M545 Low back pain: Secondary | ICD-10-CM | POA: Diagnosis not present

## 2019-01-31 DIAGNOSIS — M25561 Pain in right knee: Secondary | ICD-10-CM | POA: Diagnosis not present

## 2019-02-01 ENCOUNTER — Other Ambulatory Visit: Payer: Self-pay | Admitting: *Deleted

## 2019-02-01 NOTE — Telephone Encounter (Signed)
Faxed refill request. [facility] Magic Mouthwash Last office visit:   01/25/2019 Last Filled:    200 mL 0 09/25/2018  Please advise.

## 2019-02-03 MED ORDER — MAGIC MOUTHWASH: 5.0000 mL | Freq: Three times a day (TID) | ORAL | 0 refills | Status: DC | PRN

## 2019-02-03 NOTE — Telephone Encounter (Signed)
Sent.  If needed frequently, then need f/u.  Thanks.

## 2019-02-06 DIAGNOSIS — R202 Paresthesia of skin: Secondary | ICD-10-CM | POA: Diagnosis not present

## 2019-02-07 DIAGNOSIS — L308 Other specified dermatitis: Secondary | ICD-10-CM | POA: Diagnosis not present

## 2019-02-07 DIAGNOSIS — L851 Acquired keratosis [keratoderma] palmaris et plantaris: Secondary | ICD-10-CM | POA: Diagnosis not present

## 2019-02-08 ENCOUNTER — Ambulatory Visit: Admission: RE | Admit: 2019-02-08 | Payer: Medicare Other | Source: Ambulatory Visit

## 2019-02-09 ENCOUNTER — Other Ambulatory Visit: Payer: Self-pay | Admitting: Family Medicine

## 2019-02-12 ENCOUNTER — Ambulatory Visit: Admission: RE | Admit: 2019-02-12 | Payer: Medicare Other | Source: Ambulatory Visit

## 2019-02-14 ENCOUNTER — Ambulatory Visit: Admission: RE | Admit: 2019-02-14 | Payer: Medicare Other | Source: Ambulatory Visit

## 2019-02-14 ENCOUNTER — Ambulatory Visit
Admission: RE | Admit: 2019-02-14 | Discharge: 2019-02-14 | Disposition: A | Discharge status: Home / Self Care | Payer: Medicare Other | Source: Ambulatory Visit | Attending: Oncology | Admitting: Oncology

## 2019-02-14 DIAGNOSIS — Z122 Encounter for screening for malignant neoplasm of respiratory organs: Secondary | ICD-10-CM | POA: Diagnosis not present

## 2019-02-14 DIAGNOSIS — F17210 Nicotine dependence, cigarettes, uncomplicated: Secondary | ICD-10-CM | POA: Diagnosis not present

## 2019-02-15 ENCOUNTER — Encounter: Payer: Self-pay | Admitting: *Deleted

## 2019-02-17 ENCOUNTER — Telehealth: Payer: Self-pay | Admitting: Family Medicine

## 2019-02-17 DIAGNOSIS — E785 Hyperlipidemia, unspecified: Secondary | ICD-10-CM

## 2019-02-17 NOTE — Telephone Encounter (Signed)
Notify patient.  Chest CT had incidental finding of coronary atherosclerosis.  It is reasonable to consider starting a statin at some point given her history of hyperlipidemia.  Smoking cessation was a bigger deal previously, so that was why we deferred starting a statin at a previous visit.  If she wants to come in and talk about this then please schedule her.  Thanks.

## 2019-02-18 NOTE — Telephone Encounter (Addendum)
Patient advised and states she didn't know she had hyperlipidemia.  I explained that smoking cessation came first and foremost.  Patient is, however, willing to start medication.  Please advise.

## 2019-02-19 ENCOUNTER — Other Ambulatory Visit: Payer: Self-pay | Admitting: *Deleted

## 2019-02-19 ENCOUNTER — Encounter: Payer: Self-pay | Admitting: *Deleted

## 2019-02-19 NOTE — Telephone Encounter (Signed)
Faxed refill request. Magic Mouthwash Last office visit:   01/25/2019 Last Filled:    200 mL 0 02/03/2019  Please advise.

## 2019-02-20 MED ORDER — ATORVASTATIN CALCIUM 20 MG PO TABS: 20.0000 mg | ORAL_TABLET | Freq: Every day | ORAL | 3 refills | Status: DC

## 2019-02-20 MED ORDER — MAGIC MOUTHWASH: 5.0000 mL | Freq: Three times a day (TID) | ORAL | 0 refills | Status: DC | PRN

## 2019-02-20 NOTE — Telephone Encounter (Signed)
Sent. Thanks.  If needed recurrently/frequently, then needs office visit for recheck.

## 2019-02-20 NOTE — Telephone Encounter (Addendum)
Would try lipitor 20mg  a day.  If she has aches on the med, then cut back to 10mg  a day.  If she can't tolerate either dose, then stop it and let me know.  If able to tolerate either 20mg  or 10mg  a day, then recheck labs in about 2 months.  I put in order for fasting labs.  Thanks.  rx sent.

## 2019-02-20 NOTE — Addendum Note (Signed)
Addended by: Tonia Ghent on: 02/20/2019 08:41 PM   Modules accepted: Orders

## 2019-02-21 ENCOUNTER — Other Ambulatory Visit: Payer: Self-pay | Admitting: General Practice

## 2019-02-21 ENCOUNTER — Other Ambulatory Visit: Payer: Self-pay | Admitting: Family Medicine

## 2019-02-21 MED ORDER — MAGIC MOUTHWASH: 5.0000 mL | Freq: Three times a day (TID) | ORAL | 0 refills | Status: DC | PRN

## 2019-02-21 NOTE — Telephone Encounter (Signed)
Spoke with patient, she expressed understanding.

## 2019-02-21 NOTE — Telephone Encounter (Signed)
Milly at [facility] [place] left v/m requesting a cb or fax on magic mouthwash ingredients and amounts needed.  [facility] does not have formula for magic mouthwash.Please advise.

## 2019-02-22 ENCOUNTER — Encounter: Payer: Self-pay | Admitting: *Deleted

## 2019-02-22 NOTE — Telephone Encounter (Signed)
Left message on VM for Pharmacy

## 2019-02-22 NOTE — Telephone Encounter (Signed)
Please call back.   1 Part viscous lidocaine 2% 1 Part Maalox (do not substitute Kaopectate) 1 Part diphenhydramine 12.5 mg per 5 ml elixir.  Disp 158ml, no rf.  Take 5 mLs by mouth 3 (three) times daily as needed for mouth pain.,

## 2019-02-25 ENCOUNTER — Ambulatory Visit
Admission: RE | Admit: 2019-02-25 | Discharge: 2019-02-25 | Disposition: A | Discharge status: Home / Self Care | Payer: Medicare Other | Source: Ambulatory Visit | Attending: Family Medicine | Admitting: Family Medicine

## 2019-02-25 ENCOUNTER — Other Ambulatory Visit: Payer: Self-pay | Admitting: Family Medicine

## 2019-02-25 DIAGNOSIS — Z1231 Encounter for screening mammogram for malignant neoplasm of breast: Secondary | ICD-10-CM | POA: Diagnosis not present

## 2019-02-25 DIAGNOSIS — R928 Other abnormal and inconclusive findings on diagnostic imaging of breast: Secondary | ICD-10-CM

## 2019-02-25 DIAGNOSIS — N6489 Other specified disorders of breast: Secondary | ICD-10-CM

## 2019-02-26 DIAGNOSIS — G629 Polyneuropathy, unspecified: Secondary | ICD-10-CM | POA: Diagnosis not present

## 2019-02-26 DIAGNOSIS — M4802 Spinal stenosis, cervical region: Secondary | ICD-10-CM | POA: Diagnosis not present

## 2019-02-26 DIAGNOSIS — G894 Chronic pain syndrome: Secondary | ICD-10-CM | POA: Diagnosis not present

## 2019-02-28 ENCOUNTER — Telehealth: Payer: Self-pay

## 2019-02-28 NOTE — Telephone Encounter (Addendum)
Stop lipitor.  Tongue irritation is one issue, would stop lipitor and observe for now.  If any tongue swelling, to ER.  Update [name] early next week, after she is off lipitor.

## 2019-02-28 NOTE — Telephone Encounter (Signed)
Pt said started atorvastatin on 02/20/19. On 02/25/19 pt started with tongue swelling, feels like someone is pulling tongue out of mouth, legs cramping. Pt is not having problems breathing and slight problem swallowing but pt can swallow food and liquid. Mouth feels sore and raw. Pt took atorvastatin this morning. I advised pt to stop taking atorvastatin. Pt only wants to see Dr Damita Dunnings if needs to come in to office. Pt said she does not need to go to ED.

## 2019-02-28 NOTE — Telephone Encounter (Signed)
Pt notified as instructed and pt voiced understanding. Pt said while waiting on me to come back to phone pt took 2 benadryl and used magic mouthwash. pts tongue is swollen and puffy. Pt does not want to go to ED. I advised pt again of Dr Josefine Class instruction and she said OK she would have her daughter take her to [facility] ED. FYI to Dr Damita Dunnings. Pt declined me calling 911 for pt.

## 2019-02-28 NOTE — Telephone Encounter (Signed)
Noted. Thanks.

## 2019-03-07 ENCOUNTER — Ambulatory Visit: Payer: Medicare Other

## 2019-03-07 ENCOUNTER — Other Ambulatory Visit: Payer: Self-pay

## 2019-03-13 ENCOUNTER — Other Ambulatory Visit: Payer: Self-pay

## 2019-03-16 ENCOUNTER — Other Ambulatory Visit: Payer: Self-pay | Admitting: Family Medicine

## 2019-03-18 NOTE — Telephone Encounter (Signed)
Electronic refill request. Ondansetron Last office visit:   01/25/2019 Last Filled:    20 tablet 0 10/30/2018  Please advise.

## 2019-03-19 NOTE — Telephone Encounter (Signed)
Sent. Thanks.   

## 2019-04-02 DIAGNOSIS — M1288 Other specific arthropathies, not elsewhere classified, other specified site: Secondary | ICD-10-CM | POA: Diagnosis not present

## 2019-04-02 DIAGNOSIS — M5136 Other intervertebral disc degeneration, lumbar region: Secondary | ICD-10-CM | POA: Diagnosis not present

## 2019-04-02 DIAGNOSIS — M25561 Pain in right knee: Secondary | ICD-10-CM | POA: Diagnosis not present

## 2019-04-02 DIAGNOSIS — M545 Low back pain: Secondary | ICD-10-CM | POA: Diagnosis not present

## 2019-04-06 ENCOUNTER — Other Ambulatory Visit: Payer: Self-pay | Admitting: Family Medicine

## 2019-04-24 ENCOUNTER — Other Ambulatory Visit: Payer: Self-pay

## 2019-04-24 ENCOUNTER — Emergency Department
Admission: EM | Admit: 2019-04-24 | Discharge: 2019-04-24 | Disposition: A | Discharge status: Other Acute Inpatient Hospital | Payer: Medicare Other | Attending: Emergency Medicine | Admitting: Emergency Medicine

## 2019-04-24 ENCOUNTER — Encounter: Payer: Self-pay | Admitting: Behavioral Health

## 2019-04-24 ENCOUNTER — Inpatient Hospital Stay
Admission: AD | Admit: 2019-04-24 | Discharge: 2019-04-29 | DRG: 885 | Disposition: A | Discharge status: Home / Self Care | Payer: Medicare Other | Attending: Psychiatry | Admitting: Psychiatry

## 2019-04-24 ENCOUNTER — Telehealth: Payer: Self-pay

## 2019-04-24 DIAGNOSIS — E509 Vitamin A deficiency, unspecified: Secondary | ICD-10-CM | POA: Diagnosis present

## 2019-04-24 DIAGNOSIS — G479 Sleep disorder, unspecified: Secondary | ICD-10-CM | POA: Diagnosis present

## 2019-04-24 DIAGNOSIS — F419 Anxiety disorder, unspecified: Secondary | ICD-10-CM | POA: Insufficient documentation

## 2019-04-24 DIAGNOSIS — Z8249 Family history of ischemic heart disease and other diseases of the circulatory system: Secondary | ICD-10-CM | POA: Diagnosis not present

## 2019-04-24 DIAGNOSIS — F11259 Opioid dependence with opioid-induced psychotic disorder, unspecified: Secondary | ICD-10-CM | POA: Diagnosis not present

## 2019-04-24 DIAGNOSIS — Z90710 Acquired absence of both cervix and uterus: Secondary | ICD-10-CM

## 2019-04-24 DIAGNOSIS — Z833 Family history of diabetes mellitus: Secondary | ICD-10-CM

## 2019-04-24 DIAGNOSIS — F3163 Bipolar disorder, current episode mixed, severe, without psychotic features: Principal | ICD-10-CM | POA: Diagnosis present

## 2019-04-24 DIAGNOSIS — F29 Unspecified psychosis not due to a substance or known physiological condition: Secondary | ICD-10-CM | POA: Diagnosis not present

## 2019-04-24 DIAGNOSIS — Z79899 Other long term (current) drug therapy: Secondary | ICD-10-CM | POA: Diagnosis not present

## 2019-04-24 DIAGNOSIS — M549 Dorsalgia, unspecified: Secondary | ICD-10-CM | POA: Diagnosis not present

## 2019-04-24 DIAGNOSIS — E039 Hypothyroidism, unspecified: Secondary | ICD-10-CM | POA: Diagnosis present

## 2019-04-24 DIAGNOSIS — Z85828 Personal history of other malignant neoplasm of skin: Secondary | ICD-10-CM

## 2019-04-24 DIAGNOSIS — G8929 Other chronic pain: Secondary | ICD-10-CM | POA: Diagnosis not present

## 2019-04-24 DIAGNOSIS — F23 Brief psychotic disorder: Secondary | ICD-10-CM

## 2019-04-24 DIAGNOSIS — R5381 Other malaise: Secondary | ICD-10-CM | POA: Diagnosis not present

## 2019-04-24 DIAGNOSIS — L988 Other specified disorders of the skin and subcutaneous tissue: Secondary | ICD-10-CM | POA: Insufficient documentation

## 2019-04-24 DIAGNOSIS — G43909 Migraine, unspecified, not intractable, without status migrainosus: Secondary | ICD-10-CM | POA: Diagnosis present

## 2019-04-24 DIAGNOSIS — Z79890 Hormone replacement therapy (postmenopausal): Secondary | ICD-10-CM | POA: Diagnosis not present

## 2019-04-24 DIAGNOSIS — J449 Chronic obstructive pulmonary disease, unspecified: Secondary | ICD-10-CM | POA: Diagnosis present

## 2019-04-24 DIAGNOSIS — R451 Restlessness and agitation: Secondary | ICD-10-CM | POA: Insufficient documentation

## 2019-04-24 DIAGNOSIS — Z809 Family history of malignant neoplasm, unspecified: Secondary | ICD-10-CM

## 2019-04-24 DIAGNOSIS — F312 Bipolar disorder, current episode manic severe with psychotic features: Secondary | ICD-10-CM

## 2019-04-24 DIAGNOSIS — Z823 Family history of stroke: Secondary | ICD-10-CM

## 2019-04-24 DIAGNOSIS — M8580 Other specified disorders of bone density and structure, unspecified site: Secondary | ICD-10-CM | POA: Diagnosis present

## 2019-04-24 DIAGNOSIS — R0902 Hypoxemia: Secondary | ICD-10-CM | POA: Diagnosis not present

## 2019-04-24 DIAGNOSIS — R45851 Suicidal ideations: Secondary | ICD-10-CM | POA: Diagnosis present

## 2019-04-24 DIAGNOSIS — G4700 Insomnia, unspecified: Secondary | ICD-10-CM | POA: Diagnosis present

## 2019-04-24 DIAGNOSIS — F1120 Opioid dependence, uncomplicated: Secondary | ICD-10-CM | POA: Diagnosis present

## 2019-04-24 DIAGNOSIS — Z20828 Contact with and (suspected) exposure to other viral communicable diseases: Secondary | ICD-10-CM | POA: Diagnosis not present

## 2019-04-24 DIAGNOSIS — K219 Gastro-esophageal reflux disease without esophagitis: Secondary | ICD-10-CM | POA: Diagnosis present

## 2019-04-24 DIAGNOSIS — G20 Parkinson's disease: Secondary | ICD-10-CM | POA: Insufficient documentation

## 2019-04-24 DIAGNOSIS — Z87891 Personal history of nicotine dependence: Secondary | ICD-10-CM | POA: Insufficient documentation

## 2019-04-24 DIAGNOSIS — F3130 Bipolar disorder, current episode depressed, mild or moderate severity, unspecified: Secondary | ICD-10-CM

## 2019-04-24 DIAGNOSIS — Z8261 Family history of arthritis: Secondary | ICD-10-CM | POA: Diagnosis not present

## 2019-04-24 DIAGNOSIS — R4182 Altered mental status, unspecified: Secondary | ICD-10-CM | POA: Diagnosis not present

## 2019-04-24 DIAGNOSIS — Z765 Malingerer [conscious simulation]: Secondary | ICD-10-CM

## 2019-04-24 DIAGNOSIS — Z046 Encounter for general psychiatric examination, requested by authority: Secondary | ICD-10-CM | POA: Insufficient documentation

## 2019-04-24 LAB — COMPREHENSIVE METABOLIC PANEL
ALT: 21 U/L (ref 0–44)
AST: 30 U/L (ref 15–41)
Albumin: 3.7 g/dL (ref 3.5–5.0)
Alkaline Phosphatase: 76 U/L (ref 38–126)
Anion gap: 7 (ref 5–15)
BUN: 13 mg/dL (ref 6–20)
CO2: 26 mmol/L (ref 22–32)
Calcium: 10.3 mg/dL (ref 8.9–10.3)
Chloride: 110 mmol/L (ref 98–111)
Creatinine, Ser: 0.7 mg/dL (ref 0.44–1.00)
GFR calc Af Amer: >60 mL/min (ref >60)
GFR calc non Af Amer: >60 mL/min (ref >60)
Glucose, Bld: 96 mg/dL (ref 70–99)
Potassium: 3.8 mmol/L (ref 3.5–5.1)
Sodium: 143 mmol/L (ref 135–145)
Total Bilirubin: 0.7 mg/dL (ref 0.3–1.2)
Total Protein: 6.5 g/dL (ref 6.5–8.1)

## 2019-04-24 LAB — CBC WITH DIFFERENTIAL/PLATELET
Abs Immature Granulocytes: 0.01 10*3/uL (ref 0.00–0.07)
Basophils Absolute: 0.1 10*3/uL (ref 0.0–0.1)
Basophils Relative: 1 %
Eosinophils Absolute: 0.2 10*3/uL (ref 0.0–0.5)
Eosinophils Relative: 2 %
HCT: 40.8 % (ref 36.0–46.0)
Hemoglobin: 13.9 g/dL (ref 12.0–15.0)
Immature Granulocytes: 0 %
Lymphocytes Relative: 28 %
Lymphs Abs: 2.7 10*3/uL (ref 0.7–4.0)
MCH: 32.7 pg (ref 26.0–34.0)
MCHC: 34.1 g/dL (ref 30.0–36.0)
MCV: 96 fL (ref 80.0–100.0)
Monocytes Absolute: 0.7 10*3/uL (ref 0.1–1.0)
Monocytes Relative: 7 %
Neutro Abs: 6.1 10*3/uL (ref 1.7–7.7)
Neutrophils Relative %: 62 %
Platelets: 242 10*3/uL (ref 150–400)
RBC: 4.25 MIL/uL (ref 3.87–5.11)
RDW: 14.4 % (ref 11.5–15.5)
WBC: 9.6 10*3/uL (ref 4.0–10.5)
nRBC: 0 % (ref 0.0–0.2)

## 2019-04-24 LAB — ETHANOL: Alcohol, Ethyl (B): <10 mg/dL (ref <10)

## 2019-04-24 MED ORDER — ADULT MULTIVITAMIN W/MINERALS CH
1.0000 | ORAL_TABLET | Freq: Every day | ORAL | Status: DC
  Administered 2019-04-24 – 2019-04-29 (×6): 1 via ORAL
  Filled 2019-04-24 (×11): qty 1

## 2019-04-24 MED ORDER — ZIPRASIDONE MESYLATE 20 MG IM SOLR: 20.0000 mg | INTRAMUSCULAR | Status: DC | PRN

## 2019-04-24 MED ORDER — TRIAMCINOLONE ACETONIDE 0.1 % EX CREA
1.0000 "application " | TOPICAL_CREAM | Freq: Two times a day (BID) | CUTANEOUS | Status: DC
  Administered 2019-04-25 – 2019-04-29 (×9): 1 via TOPICAL
  Filled 2019-04-24 (×3): qty 15

## 2019-04-24 MED ORDER — OMEGA-3-ACID ETHYL ESTERS 1 G PO CAPS
1.0000 g | ORAL_CAPSULE | Freq: Every day | ORAL | Status: DC
  Administered 2019-04-25 – 2019-04-29 (×5): 1 g via ORAL
  Filled 2019-04-24 (×5): qty 1

## 2019-04-24 MED ORDER — TIZANIDINE HCL 4 MG PO TABS
4.0000 mg | ORAL_TABLET | Freq: Three times a day (TID) | ORAL | Status: DC
  Administered 2019-04-25 – 2019-04-29 (×14): 4 mg via ORAL
  Filled 2019-04-24 (×16): qty 1

## 2019-04-24 MED ORDER — HALOPERIDOL LACTATE 5 MG/ML IJ SOLN
5.0000 mg | Freq: Once | INTRAMUSCULAR | Status: DC
  Filled 2019-04-24: qty 1

## 2019-04-24 MED ORDER — HYDROXYZINE HCL 50 MG PO TABS
50.0000 mg | ORAL_TABLET | Freq: Three times a day (TID) | ORAL | Status: DC | PRN
  Administered 2019-04-26 – 2019-04-28 (×7): 50 mg via ORAL
  Filled 2019-04-24 (×7): qty 1

## 2019-04-24 MED ORDER — LORAZEPAM 2 MG/ML IJ SOLN
2.0000 mg | Freq: Once | INTRAMUSCULAR | Status: AC
  Administered 2019-04-24: 2 mg via INTRAMUSCULAR
  Filled 2019-04-24: qty 1

## 2019-04-24 MED ORDER — SENNOSIDES-DOCUSATE SODIUM 8.6-50 MG PO TABS
2.0000 | ORAL_TABLET | Freq: Every day | ORAL | Status: DC
  Administered 2019-04-25 – 2019-04-29 (×5): 2 via ORAL
  Filled 2019-04-24 (×6): qty 2

## 2019-04-24 MED ORDER — VITAMIN D3 25 MCG (1000 UNIT) PO TABS
1000.0000 [IU] | ORAL_TABLET | Freq: Every day | ORAL | Status: DC
  Administered 2019-04-24 – 2019-04-29 (×6): 1000 [IU] via ORAL
  Filled 2019-04-24 (×9): qty 1

## 2019-04-24 MED ORDER — DIPHENHYDRAMINE HCL 50 MG/ML IJ SOLN
50.0000 mg | Freq: Once | INTRAMUSCULAR | Status: AC
  Administered 2019-04-24: 11:00:00 50 mg via INTRAMUSCULAR
  Filled 2019-04-24: qty 1

## 2019-04-24 MED ORDER — PANTOPRAZOLE SODIUM 40 MG PO TBEC
40.0000 mg | DELAYED_RELEASE_TABLET | Freq: Every day | ORAL | Status: DC
  Administered 2019-04-25 – 2019-04-29 (×5): 40 mg via ORAL
  Filled 2019-04-24 (×5): qty 1

## 2019-04-24 MED ORDER — VITAMIN A 10000 UNITS PO CAPS
10000.0000 [IU] | ORAL_CAPSULE | Freq: Every day | ORAL | Status: DC
  Administered 2019-04-25 – 2019-04-28 (×4): 10000 [IU] via ORAL
  Filled 2019-04-24 (×9): qty 1

## 2019-04-24 MED ORDER — VALACYCLOVIR HCL 500 MG PO TABS
500.0000 mg | ORAL_TABLET | Freq: Two times a day (BID) | ORAL | Status: DC
  Administered 2019-04-25 – 2019-04-29 (×9): 500 mg via ORAL
  Filled 2019-04-24 (×11): qty 1

## 2019-04-24 MED ORDER — VITAMIN C 500 MG PO TABS
500.0000 mg | ORAL_TABLET | Freq: Every day | ORAL | Status: DC
  Administered 2019-04-25 – 2019-04-29 (×5): 500 mg via ORAL
  Filled 2019-04-24 (×5): qty 1

## 2019-04-24 MED ORDER — BIOTIN W/ VITAMINS C & E 1250-7.5-7.5 MCG-MG-UNT PO CHEW: CHEWABLE_TABLET | Freq: Every day | ORAL | Status: DC

## 2019-04-24 MED ORDER — OXYCODONE HCL 5 MG PO TABS
5.0000 mg | ORAL_TABLET | Freq: Three times a day (TID) | ORAL | Status: DC | PRN
  Administered 2019-04-25 – 2019-04-29 (×9): 5 mg via ORAL
  Filled 2019-04-24 (×10): qty 1

## 2019-04-24 MED ORDER — LORAZEPAM 1 MG PO TABS
1.0000 mg | ORAL_TABLET | ORAL | Status: AC | PRN
  Administered 2019-04-26: 1 mg via ORAL
  Filled 2019-04-24: qty 1

## 2019-04-24 MED ORDER — LORAZEPAM 0.5 MG PO TABS
0.5000 mg | ORAL_TABLET | Freq: Two times a day (BID) | ORAL | Status: DC
  Administered 2019-04-24 – 2019-04-25 (×2): 0.5 mg via ORAL
  Filled 2019-04-24 (×2): qty 1

## 2019-04-24 MED ORDER — ATORVASTATIN CALCIUM 20 MG PO TABS: 20.0000 mg | ORAL_TABLET | Freq: Every day | ORAL | Status: DC

## 2019-04-24 MED ORDER — ONDANSETRON 4 MG PO TBDP
4.0000 mg | ORAL_TABLET | Freq: Three times a day (TID) | ORAL | Status: DC | PRN
  Administered 2019-04-25 – 2019-04-27 (×2): 4 mg via ORAL
  Filled 2019-04-24 (×2): qty 1

## 2019-04-24 MED ORDER — ALUM & MAG HYDROXIDE-SIMETH 200-200-20 MG/5ML PO SUSP: 30.0000 mL | ORAL | Status: DC | PRN

## 2019-04-24 MED ORDER — VITAMIN B-12 1000 MCG PO TABS
1000.0000 ug | ORAL_TABLET | Freq: Every day | ORAL | Status: DC
  Administered 2019-04-25 – 2019-04-29 (×5): 1000 ug via ORAL
  Filled 2019-04-24 (×4): qty 1

## 2019-04-24 MED ORDER — OLANZAPINE 5 MG PO TBDP: 10.0000 mg | ORAL_TABLET | Freq: Three times a day (TID) | ORAL | Status: DC | PRN

## 2019-04-24 MED ORDER — MAGNESIUM HYDROXIDE 400 MG/5ML PO SUSP: 30.0000 mL | Freq: Every day | ORAL | Status: DC | PRN

## 2019-04-24 MED ORDER — LAMOTRIGINE 25 MG PO TABS
25.0000 mg | ORAL_TABLET | Freq: Every day | ORAL | Status: DC
  Administered 2019-04-24 – 2019-04-26 (×3): 25 mg via ORAL
  Filled 2019-04-24 (×3): qty 1

## 2019-04-24 MED ORDER — VITAMIN E 45 MG (100 UNIT) PO CAPS
200.0000 [IU] | ORAL_CAPSULE | Freq: Every day | ORAL | Status: DC
  Administered 2019-04-25 – 2019-04-29 (×5): 200 [IU] via ORAL
  Filled 2019-04-24 (×5): qty 2

## 2019-04-24 MED ORDER — ALBUTEROL SULFATE HFA 108 (90 BASE) MCG/ACT IN AERS
2.0000 | INHALATION_SPRAY | Freq: Four times a day (QID) | RESPIRATORY_TRACT | Status: DC | PRN
  Filled 2019-04-24: qty 6.7

## 2019-04-24 MED ORDER — LEVOTHYROXINE SODIUM 88 MCG PO TABS
88.0000 ug | ORAL_TABLET | Freq: Every day | ORAL | Status: DC
  Administered 2019-04-25 – 2019-04-29 (×5): 88 ug via ORAL
  Filled 2019-04-24 (×5): qty 1

## 2019-04-24 MED ORDER — ACETAMINOPHEN 325 MG PO TABS
650.0000 mg | ORAL_TABLET | Freq: Four times a day (QID) | ORAL | Status: DC | PRN
  Administered 2019-04-27 – 2019-04-28 (×2): 650 mg via ORAL
  Filled 2019-04-24 (×2): qty 2
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
## 2019-04-24 NOTE — Telephone Encounter (Signed)
Noted. Thanks.  Will await ER notes.

## 2019-04-24 NOTE — ED Notes (Signed)
Gave pt 2 warm blankets and convinced her to give this tech her grey and pink shirt. Ring on left 4th finger still in her possession informed RN

## 2019-04-24 NOTE — ED Notes (Signed)
Notified Gwen, RN of updated vital signs and patient says she doesn't feel she has [place] she just needs medical help because her skin is breaking open. Also notified Gwen that patient will be coming with a bag of medication that pharmacy tech Richard reconciled.

## 2019-04-24 NOTE — ED Provider Notes (Signed)
[facility] Emergency Department Provider Note       Time seen: ----------------------------------------- 10:40 AM on 04/24/2019 -----------------------------------------   I have reviewed the triage vital signs and the nursing notes.  HISTORY   Chief Complaint No chief complaint on file.    HPI Leslie Wong is a 63 y.o. female with a history of chronic pain, skin cancer, depression, gastroparesis, hypothyroidism, migraines, Parkinson's disease who presents to the ED for multiple complaints.  She complains of dry and cracking skin with diffuse pain and anxiety.  She was brought in crying and very upset.  Past Medical History:  Diagnosis Date  . Back pain   . Cancer ([place])    skin  . Depression    BAD  . Gastroparesis   . GERD (gastroesophageal reflux disease)   . Hypothyroidism   . Insomnia   . Migraines   . Parkinson disease ([place])    per [facility], dx'd 2019  . Recurrent cold sores   . Shoulder bursitis   . Urge incontinence     Patient Active Problem List   Diagnosis Date Noted  . Cracking skin 01/27/2019  . Mood change 10/14/2018  . Bipolar disorder with severe mania ([place])   . Bipolar I disorder, current or most recent episode manic, with psychotic features ([place]) 08/28/2018  . Itch of skin 08/15/2018  . Oral ulcer 08/15/2018  . Night sweats 05/13/2018  . Pupil asymmetry 05/13/2018  . Hx of cold sores 03/14/2018  . Parkinson disease ([place])   . Health care maintenance 06/23/2017  . HLD (hyperlipidemia) 03/10/2016  . Advance care planning 03/10/2016  . Generalized abdominal pain 02/18/2015  . Osteopenia 04/18/2014  . Medicare annual wellness visit, initial 03/18/2014  . Shoulder pain 01/17/2013  . Chronic back pain 09/14/2011  . Hypercalcemia 03/25/2010  . NAUSEA WITH VOMITING 05/12/2009  . INCONTINENCE, URGE 04/16/2009  . CARPAL TUNNEL SYNDROME, BILATERAL 09/24/2008  . Tongue irritation 09/24/2008  . URETHRAL  STRICTURE 06/13/2007  . Hypothyroidism 06/05/2007  . DISORDER, BIPOLAR NOS 06/05/2007  . MIGRAINE HEADACHE 06/05/2007  . GASTROPARESIS 06/05/2007  . Gastroesophageal reflux disease with hiatal hernia 06/05/2007  . IRRITABLE BOWEL SYNDROME 06/05/2007  . FIBROCYSTIC BREAST DISEASE 06/05/2007    Past Surgical History:  Procedure Laterality Date  . ABDOMINAL HYSTERECTOMY  1999   total  . APPENDECTOMY    . ESOPHAGOGASTRODUODENOSCOPY  01/2006   negative except small hiatal hernia  . ESOPHAGOGASTRODUODENOSCOPY  02/24/2015   See report  . LAPAROSCOPY     for endometriosis  . OVARIAN CYST REMOVAL  1990   unilateral  . TONSILLECTOMY AND ADENOIDECTOMY      Allergies Cymbalta [duloxetine hcl]; Valproic acid; Aspirin; Atorvastatin; Baclofen; Codeine; Duloxetine; Erythromycin; Gabapentin; Metoclopramide hcl; Nsaids; Nucynta er [tapentadol hcl er]; Nucynta [tapentadol]; Prednisone; Pregabalin; Seroquel [quetiapine fumerate]; Sulfa antibiotics; Topamax; and Vicodin [hydrocodone-acetaminophen]  Social History Social History   Tobacco Use  . Smoking status: Former Smoker    Packs/day: 1.00    Years: 42.50    Pack years: 42.50    Types: Cigarettes  . Smokeless tobacco: Former Network engineer Use Topics  . Alcohol use: No    Alcohol/week: 0.0 standard drinks  . Drug use: No   Review of Systems Constitutional: Negative for fever. Cardiovascular: Negative for chest pain. Respiratory: Negative for shortness of breath. Gastrointestinal: Negative for abdominal pain, vomiting and diarrhea. Musculoskeletal: Negative for back pain. Skin: Positive for dry and cracking skin Neurological: Negative for headaches, focal weakness or numbness.  Psychiatric: Positive for anxiety   All systems negative/normal/unremarkable except as stated in the HPI  ____________________________________________   PHYSICAL EXAM:  VITAL SIGNS: ED Triage Vitals  Enc Vitals Group     BP      Pulse      Resp       Temp      Temp src      SpO2      Weight      Height      Head Circumference      Peak Flow      Pain Score      Pain Loc      Pain Edu?      Excl. in [place]?    Constitutional: Alert and oriented.  Anxious and tearful, mild to moderate distress Eyes: Conjunctivae are normal. Normal extraocular movements. ENT      Head: Normocephalic and atraumatic.      Nose: No congestion/rhinnorhea.      Mouth/Throat: Mucous membranes are moist.      Neck: No stridor. Cardiovascular: Normal rate, regular rhythm. No murmurs, rubs, or gallops. Respiratory: Normal respiratory effort without tachypnea nor retractions. Breath sounds are clear and equal bilaterally. No wheezes/rales/rhonchi. Gastrointestinal: Soft and nontender. Normal bowel sounds Musculoskeletal: Nontender with normal range of motion in extremities. No lower extremity tenderness nor edema. Neurologic:  Normal speech and language. No gross focal neurologic deficits are appreciated.  Skin:  Skin is warm, dry and intact. No rash noted. Psychiatric: Anxious mood and affect  ____________________________________________  ED COURSE:  As part of my medical decision making, I reviewed the following data within the [place] History obtained from family if available, nursing notes, old chart and ekg, as well as notes from prior ED visits. Patient presented for multiple complaints, we will assess with labs and imaging as indicated at this time.   Procedures  [name] was evaluated in Emergency Department on 04/24/2019 for the symptoms described in the history of present illness. She was evaluated in the context of the global COVID-19 pandemic, which necessitated consideration that the patient might be at risk for infection with the SARS-CoV-2 virus that causes COVID-19. Institutional protocols and algorithms that pertain to the evaluation of patients at risk for COVID-19 are in a state of rapid change based on  information released by regulatory bodies including the CDC and federal and state organizations. These policies and algorithms were followed during the patient's care in the ED.  ____________________________________________   LABS (pertinent positives/negatives)  Labs Reviewed  CBC WITH DIFFERENTIAL/PLATELET  COMPREHENSIVE METABOLIC PANEL  ETHANOL  URINALYSIS, COMPLETE (UACMP) WITH MICROSCOPIC  URINE DRUG SCREEN, QUALITATIVE ([facility] ONLY)  CBG MONITORING, ED  ____________________________________________   DIFFERENTIAL DIAGNOSIS   Anxiety, depression, substance abuse, chronic pain, acute psychosis, occult infection  FINAL ASSESSMENT AND PLAN  Acute psychosis   Plan: The patient had presented for multiple complaints. Patient's labs have not revealed any acute process.  She was given Ativan, Haldol and Benadryl for sedation because she was so agitated.  This was done in consultation with psychiatry.  I will consult psychiatry for evaluation and likely admission.   Laurence Aly, MD    Note: This note was generated in part or whole with voice recognition software. Voice recognition is usually quite accurate but there are transcription errors that can and very often do occur. I apologize for any typographical errors that were not detected and corrected.     Earleen Newport, MD  04/24/19 1403  

## 2019-04-24 NOTE — ED Notes (Signed)
Patient talking with Dr. Leverne Humbles psychiatry

## 2019-04-24 NOTE — ED Notes (Signed)
Patient talking with psychiatrist Dr. Leverne Humbles

## 2019-04-24 NOTE — ED Notes (Signed)
Pt dressed out into burgundy scrubs by this tech belongings include: red and white slippers, black pants, black underwear, grey shirt, 2 black hair ties,  1 blue hair tie, hair bonnet. 1 grey and pink shirt and ring on left 4th finger left with pt due to anxiety level. Informed RN of this and will try to take at a later time.
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
## 2019-04-24 NOTE — BH Assessment (Signed)
Patient is to be admitted to [facility] by Dr. Leverne Humbles.  Attending Physician will be Dr. Weber Cooks.   Patient has been assigned to room 324, by [place].   Intake Paper Work has been signed and placed on patient chart.  ER staff is aware of the admission:  Glenda/LouAnn, ER Secretary    ER MD   Donneta Romberg, Patient's Nurse   Butch Penny, Patient Access.

## 2019-04-24 NOTE — Tx Team (Signed)
Initial Treatment Plan 04/24/2019 5:55 PM [name] TIW:[id number]    PATIENT STRESSORS: Health problems Medication change or noncompliance Substance abuse   PATIENT STRENGTHS: Ability for insight Capable of independent living Communication skills   PATIENT IDENTIFIED PROBLEMS: Psychosis 04/24/19  Substance  Abuse 4//29/2020  Chronic  Pain  04/24/2019                 DISCHARGE CRITERIA:  Ability to meet basic life and health needs Improved stabilization in mood, thinking, and/or behavior Medical problems require only outpatient monitoring  PRELIMINARY DISCHARGE PLAN: Outpatient therapy Return to previous living arrangement  PATIENT/FAMILY INVOLVEMENT: This treatment plan has been presented to and reviewed with the patient, Leslie Wong, and/or family member,  .  The patient and family have been given the opportunity to ask questions and make suggestions.  Leodis Liverpool, RN 04/24/2019, 5:55 PM
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
## 2019-04-24 NOTE — ED Notes (Signed)
Patient talking to TTS 

## 2019-04-24 NOTE — Telephone Encounter (Signed)
Pt called office;pt said she thinks she has black mold in her appt and that is causing her skin to break open. Pt said the cream the dermatologist gave her is not helping. I advised pt to contact dermatologist for further advice and treatment. Pt voiced understanding. The pt said what about the other stuff. I asked what other stuff. Pt said she has no appetite,nauseated,has pinched nerve in neck and having a lot of difficulty getting up from laying position. Pt started to cry and she weighs 94 lbs now. On 04/20/19 pt said her temp ranged from 102-105.I asked pt to ck temp now; pt tried to ck temp 3-4 mins and could not get thermometer to register.pt said "I feel like I am walking through the pits of hell with how my feet feel." pt said she cannot feel anything; I asked her what she meant and she said she can not feel what she touches. Pt said she drops things a lot. Pt continues to cry. Pt has runny nose,prod cough and SOB.pt said she had told behavioral health before and they do not listen. I asked what pt was referring to and pt said they do not listen to her. Pt said she was not SI/HI.pt is still crying. I advised pt she should be evaluated at ED; concerned for physical complaints but more concerned about inability to stop crying.I asked pt if anyone was with her and she said yes her service dog; I asked if anyone was there that could drive a car. Pt said her daughter lives 3 mins away but has a 69 mth old and will not go to ED. I asked pt if she wanted me to call the ambulance for her and she said yes. I called 911 and dispatcher said she had sent ambulance non emergency traffic and dispatcher said she would call pt to ask other questions. I went back on line and advised pt of what dispatcher said and pt was not crying now and said it would be OK to hang up with me to answer dispatchers call. FYI to Dr Damita Dunnings.

## 2019-04-24 NOTE — ED Notes (Signed)
Patient given IM Benadryl and Ativan due to anxiety, and agitation. Patient was screaming and hollering that she needed gloves and a mask, she stated she did not want to come to this hospital and get the Coronavirus.

## 2019-04-24 NOTE — ED Notes (Addendum)
Triage and Initial Assessment NOTE  Patient brought in by [facility] due she thinking she has black mold in her appt and that is causing her skin to break open. Patient came in screaming and yelling bring me some gloves and a mask "im not catching Coronavirus in this hospital. Patient began talking and yelling at Microbiologist. Patient was asked to try and calm down, writer informed her we are here to help her. Denies SI/HI/AVH.  Patient assigned to appropriate care area   Introduced self to pt  Patient oriented to unit/care area: Informed that, for their safety, care areas are designed for safety and visiting and phone hours explained to patient. Patient verbalizes understanding, and verbal contract for safety obtained  Environment secured  Pt talking loudly  - appears intoxicated

## 2019-04-24 NOTE — Plan of Care (Signed)
  Problem: Education: Goal: Knowledge of [facility] Education information/materials will improve Outcome: Not Progressing Note:  Writer informed patient of [place] Education and unit programing , unable  to repeat  or verbalize understanding

## 2019-04-25 DIAGNOSIS — F1120 Opioid dependence, uncomplicated: Secondary | ICD-10-CM | POA: Diagnosis present

## 2019-04-25 DIAGNOSIS — F3130 Bipolar disorder, current episode depressed, mild or moderate severity, unspecified: Secondary | ICD-10-CM

## 2019-04-25 LAB — URINALYSIS, COMPLETE (UACMP) WITH MICROSCOPIC
Bacteria, UA: NONE SEEN
Bilirubin Urine: NEGATIVE
Glucose, UA: NEGATIVE mg/dL
Hgb urine dipstick: NEGATIVE
Ketones, ur: NEGATIVE mg/dL
Leukocytes,Ua: NEGATIVE
Nitrite: NEGATIVE
Protein, ur: NEGATIVE mg/dL
Specific Gravity, Urine: 1.01 (ref 1.005–1.030)
Squamous Epithelial / HPF: NONE SEEN (ref 0–5)
pH: 7 (ref 5.0–8.0)

## 2019-04-25 LAB — URINE DRUG SCREEN, QUALITATIVE (ARMC ONLY)
Amphetamines, Ur Screen: NOT DETECTED
Barbiturates, Ur Screen: NOT DETECTED
Benzodiazepine, Ur Scrn: NOT DETECTED
Cannabinoid 50 Ng, Ur ~~LOC~~: POSITIVE — AB
Cocaine Metabolite,Ur ~~LOC~~: NOT DETECTED
MDMA (Ecstasy)Ur Screen: NOT DETECTED
Methadone Scn, Ur: NOT DETECTED
Opiate, Ur Screen: NOT DETECTED
Phencyclidine (PCP) Ur S: NOT DETECTED
Tricyclic, Ur Screen: NOT DETECTED

## 2019-04-25 LAB — TSH: TSH: 4.392 u[IU]/mL (ref 0.350–4.500)

## 2019-04-25 MED ORDER — ENSURE ENLIVE PO LIQD
237.0000 mL | Freq: Two times a day (BID) | ORAL | Status: DC
  Administered 2019-04-25 – 2019-04-29 (×9): 237 mL via ORAL

## 2019-04-25 MED ORDER — HYDROCERIN EX CREA
TOPICAL_CREAM | Freq: Two times a day (BID) | CUTANEOUS | Status: DC
  Administered 2019-04-25 – 2019-04-26 (×3): via TOPICAL
  Administered 2019-04-27 (×2): 1 via TOPICAL
  Administered 2019-04-28 – 2019-04-29 (×3): via TOPICAL
  Filled 2019-04-25 (×2): qty 113

## 2019-04-25 MED ORDER — OLANZAPINE 5 MG PO TBDP
15.0000 mg | ORAL_TABLET | Freq: Every day | ORAL | Status: DC
  Administered 2019-04-25 – 2019-04-28 (×4): 15 mg via ORAL
  Filled 2019-04-25 (×4): qty 3

## 2019-04-25 NOTE — Progress Notes (Signed)
D - Patient was in her room upon arrival to the unit. Patient was pleasant during assessment and medication administration. Patient denies SI/HI/AVH. Patient said she had chronic nerve pain and couldn't put a number on it. Patient endorses anxiety and depression stating they were both 8/10 and said that's why she is here. Patient was isolative to her room this evening.   A - Patient was compliant with medication administration per MD orders. Patient was given education. Patient given support and encouragement to be active in her treatment plan. Patient informed to let staff know if there are any issues or problems on the unit.   R - Patient being monitored Q 15 minutes for safety per unit protocol. Patient remains safe on the unit.

## 2019-04-25 NOTE — Plan of Care (Signed)
Patient is alert to self, place and time. Denies SI, HI and AVH. Patient can become disorganized and have flight of ideas jumping from one topic to the next. Patient believes she is unable to walk due to weakness and "poison in her body." Patient is able to walk, and walked to the medication room to receive medications and walked to the physicians office. Patient can be hypervigilant around the nurses station. Patient is assertive and anxious with mild confusion at times. Patient denies SI, HI and AVH. Safety checks Q 15 minutes to continue. Problem: Education: Goal: Knowledge of [facility] Education information/materials will improve Outcome: Not Progressing   Problem: Coping: Goal: Coping ability will improve Outcome: Not Progressing Goal: Will verbalize feelings Outcome: Not Progressing   Problem: Safety: Goal: Ability to redirect hostility and anger into socially appropriate behaviors will improve Outcome: Not Progressing Goal: Ability to remain free from injury will improve Outcome: Not Progressing

## 2019-04-25 NOTE — [facility] Suicide Risk Assessment (Signed)
[facility] Admission Suicide Risk Assessment   Nursing information obtained from:  Patient Demographic factors:  Caucasian, Living alone, Low socioeconomic status, Unemployed Current Mental Status:  NA Loss Factors:  NA Historical Factors:  NA Risk Reduction Factors:  NA  Total Time spent with patient: 1 hour Principal Problem: Bipolar I disorder, most recent episode depressed ([place]) Diagnosis:  Principal Problem:   Bipolar I disorder, most recent episode depressed ([place]) Active Problems:   Hypothyroidism   Chronic back pain   Bipolar 1 disorder, mixed, severe ([facility])   Opiate dependence ([place])  Subjective Data: Patient seen and chart reviewed.  This is a patient with a history of bipolar disorder brought into the hospital very agitated and disorganized and confused with evidence of probably self-inflicted starvation due to mental illness.  Passive suicidal thoughts with no plan or intent.  No homicidal ideation.  Thoughts disorder to the point that she cannot make rational decisions for herself.  Continued Clinical Symptoms:  Alcohol Use Disorder Identification Test Final Score (AUDIT): 3 The "Alcohol Use Disorders Identification Test", Guidelines for Use in Primary Care, Second Edition.  World Pharmacologist [facility]). Score between 0-7:  no or low risk or alcohol related problems. Score between 8-15:  moderate risk of alcohol related problems. Score between 16-19:  high risk of alcohol related problems. Score 20 or above:  warrants further diagnostic evaluation for alcohol dependence and treatment.   CLINICAL FACTORS:   Bipolar Disorder:   Mixed State Depressive phase   Musculoskeletal: Strength & Muscle Tone: within normal limits Gait & Station: unsteady Patient leans: N/A  Psychiatric Specialty Exam: Physical Exam  Nursing note and vitals reviewed. Constitutional: She appears well-developed. She appears distressed.  HENT:  Head: Normocephalic and atraumatic.  Eyes: Pupils are  equal, round, and reactive to light. Conjunctivae are normal.  Neck: Normal range of motion.  Cardiovascular: Regular rhythm and normal heart sounds.  Respiratory: Effort normal. No respiratory distress.  GI: Soft.  Musculoskeletal: Normal range of motion.  Neurological: She is alert.  Skin: Skin is warm and dry.     Psychiatric: Her mood appears anxious. Her speech is tangential. She is agitated. She is not aggressive. Thought content is paranoid and delusional. Cognition and memory are impaired. She expresses impulsivity. She expresses no homicidal and no suicidal ideation.    Review of Systems  Constitutional: Negative.   HENT: Negative.   Eyes: Negative.   Respiratory: Negative.   Cardiovascular: Negative.   Gastrointestinal: Negative.   Musculoskeletal: Negative.   Skin: Negative.   Neurological: Negative.   Psychiatric/Behavioral: Positive for depression, memory loss and suicidal ideas. Negative for hallucinations and substance abuse. The patient is nervous/anxious and has insomnia.     Blood pressure (!) 135/97, pulse 88, temperature 98.1 F (36.7 C), temperature source Oral, resp. rate 18, height 4\' 11"  (1.499 m), weight 41.3 kg, SpO2 98 %.Body mass index is 18.38 kg/m.  General Appearance: Disheveled and What is striking about her is how much weight she is lost and how she looks like she is in pretty poor self-care  Eye Contact:  Fair  Speech:  Garbled  Volume:  Increased  Mood:  Anxious  Affect:  Inappropriate and Labile  Thought Process:  Disorganized  Orientation:  Full (Time, Place, and Person)  Thought Content:  Illogical, Delusions, Paranoid Ideation, Rumination and Tangential  Suicidal Thoughts:  Yes.  without intent/plan  Homicidal Thoughts:  No  Memory:  Immediate;   Fair Recent;   Poor Remote;  Poor  Judgement:  Poor  Insight:  Lacking  Psychomotor Activity:  Restlessness  Concentration:  Concentration: Poor  Recall:  Poor  Fund of Knowledge:  Fair   Language:  Fair  Akathisia:  No  Handed:  Right  AIMS (if indicated):     Assets:  Desire for Improvement Housing Social Support  ADL's:  Impaired  Cognition:  Impaired,  Mild  Sleep:         COGNITIVE FEATURES THAT CONTRIBUTE TO RISK:  Polarized thinking and Thought constriction (tunnel vision)    SUICIDE RISK:   Mild:  Suicidal ideation of limited frequency, intensity, duration, and specificity.  There are no identifiable plans, no associated intent, mild dysphoria and related symptoms, good self-control (both objective and subjective assessment), few other risk factors, and identifiable protective factors, including available and accessible social support.  PLAN OF CARE: Patient with bipolar disorder which puts her at intrinsically elevated risk of suicide.  She is not threatening to harm her self now but has admitted to having thoughts about how she wishes she could die rather than live in such misery.  Patient is showing poor insight.  We will try to get her back on appropriate psychiatric medicine.  Continue 15-minute checks and also keep a close eye on her to make sure she is not a fall risk.  We will need to make some special arrangements for discharge planning.  I certify that inpatient services furnished can reasonably be expected to improve the patient's condition.   Alethia Berthold, MD 04/25/2019, 11:18 AM

## 2019-04-25 NOTE — Progress Notes (Signed)
Recreation Therapy Notes  Date: 04/25/2019  Time: 9:30 am   Location: Craft room   Behavioral response: N/A   Intervention Topic: Leisure  Discussion/Intervention: Patient did not attend group.   Clinical Observations/Feedback:  Patient did not attend group.   Jahnessa Vanduyn LRT/CTRS        Julez Huseby 04/25/2019 11:50 AM

## 2019-04-25 NOTE — H&P (Signed)
Psychiatric Admission Assessment Adult  Patient Identification: Leslie Wong MRN:  [id number] Date of Evaluation:  04/25/2019 Chief Complaint:  Bipolar 1 Disorder, Mixed ,severe Principal Diagnosis: Bipolar I disorder, most recent episode depressed ([place]) Diagnosis:  Principal Problem:   Bipolar I disorder, most recent episode depressed ([place]) Active Problems:   Hypothyroidism   Chronic back pain   Bipolar 1 disorder, mixed, severe ([place])   Opiate dependence ([place])  History of Present Illness: Patient seen and chart reviewed.  Patient familiar from prior hospitalizations.  This is a woman with a history of bipolar disorder who was brought here after she called her primary care doctor's office in such a state of confusion and agitation that they called someone to check on her safety.  Patient is an unreliable historian.  Her memory is poor her estimation of her own health and her assessment of what is going on with her are all impaired by her illness.  Patient has lost a very significant amount of weight.  Looks almost cachectic.  Apparently they checked with her family in the emergency room and family has been trying to push food on her and bring her groceries but feels the patient has stopped eating.  Patient is very focused on complaints about "black mold" which she says is in her apartment.  Her speech is rambling tangential and disorganized filled with complaints about how her health is being affected by the black mold.  She claims that she has been eating well but admits she has "a small appetite".  She admits that she is made comments at times about wishing that she would die rather than live in such misery but absolutely denies any suicidal intent or plan.  No evidence I think that she was intentionally trying to kill her self.  Denies homicidal ideation.  Denies hallucinations but so many of her stories are disorganized and hard to follow it is difficult to be sure just how out of touch  with reality she is.  It sounds like she is probably not been compliant with psychiatric medicine recently.  She claims that she has been seeing her outpatient psychiatric provider at [facility] right up until last month but I am suspicious that may not be true.  I have left a message at [facility] asking for a call back.  Patient claims the only psychiatric medicine she was on was Wellbutrin and Ativan and Lamictal, none of which would have kept her out of the kind of psychotic bipolar that she had last year.  I also confirmed that she is continuing to get oxycodone prescribed by the pain clinic in [place]. Associated Signs/Symptoms: Depression Symptoms:  anhedonia, difficulty concentrating, hopelessness, suicidal thoughts without plan, anxiety, disturbed sleep, weight loss, (Hypo) Manic Symptoms:  Distractibility, Flight of Ideas, Anxiety Symptoms:  Excessive Worry, Psychotic Symptoms:  Delusions, Paranoia, PTSD Symptoms: Negative Total Time spent with patient: 1 hour  Past Psychiatric History: Patient has a long history of bipolar disorder.  We are most familiar with her from 2 hospitalizations in the fall 2019.  She went through a bad spell of mania at that time and actually had even more hospitalizations at other facilities before she finally stabilized.  Last time we saw her she had been put on clozapine and was doing well.  No idea whether this medicine was continued after she was discharged but from what she is saying I suspect probably not.  In any case it definitely took antipsychotics to get her well.  Patient has a long list of medicines that she claims to be allergic to.  Most of her descriptions of what this allergy was are very vague and unbelievable.  She talks about how all antipsychotics cause catastrophic side effects but we know that is not true.  I would also like to inject that the patient remains convinced that she has Parkinson's disease which I find very very  unlikely.  She is not on any medicine for Parkinson's disease and she does not to my eyes show any significant signs or symptoms consistent with it.  She talks about having a "tremor" but at baseline during the conversation and even when she was showing me the skin on her hands there was no tremor at all noticeable.  She only had a tremor when she held out her hand and started talking about the tremor at which point she had an obviously self-induced shaking of her hands.  Therefore I am much less concerned about the effect of any antipsychotics on this allegedly "Parkinson's disease" then we had been in the past  Is the patient at risk to self? Yes.    Has the patient been a risk to self in the past 6 months? Yes.    Has the patient been a risk to self within the distant past? Yes.    Is the patient a risk to others? No.  Has the patient been a risk to others in the past 6 months? No.  Has the patient been a risk to others within the distant past? No.   Prior Inpatient Therapy: Prior Inpatient Therapy: Yes Prior Therapy Dates: 09/2018 Prior Therapy Facilty/Provider(s): [facility] ED Reason for Treatment: Bipolar D/O Prior Outpatient Therapy: Prior Outpatient Therapy: Yes Prior Therapy Dates: Current Prior Therapy Facilty/Provider(s): [facility](Pt unable to recall the name of the facility) Reason for Treatment: Bipolar D/O Does patient have an ACCT team?: No Does patient have Intensive In-House Services?  : No Does patient have [facility] services? : No Does patient have P4CC services?: No  Alcohol Screening: 1. How often do you have a drink containing alcohol?: 2 to 4 times a month 2. How many drinks containing alcohol do you have on a typical day when you are drinking?: 1 or 2 3. How often do you have six or more drinks on one occasion?: Less than monthly AUDIT-C Score: 3 4. How often during the last year have you found that you were not able to stop drinking once you had started?: Never 5.  How often during the last year have you failed to do what was normally expected from you becasue of drinking?: Never 6. How often during the last year have you needed a first drink in the morning to get yourself going after a heavy drinking session?: Never 7. How often during the last year have you had a feeling of guilt of remorse after drinking?: Never 8. How often during the last year have you been unable to remember what happened the night before because you had been drinking?: Never 9. Have you or someone else been injured as a result of your drinking?: No 10. Has a relative or friend or a doctor or another health worker been concerned about your drinking or suggested you cut down?: No Alcohol Use Disorder Identification Test Final Score (AUDIT): 3 Alcohol Brief Interventions/Follow-up: AUDIT Score <7 follow-up not indicated Substance Abuse History in the last 12 months:  No. Consequences of Substance Abuse: I am not exactly going to call her substance  abuse but I am definitely concerned that this woman continues to be prescribed oxycodone at doses that have actually escalated since last year and Ativan on a daily basis despite there being no really clear need for either 1.  I have my suspicions that her use of her narcotics and benzodiazepines fogs her mind to the point that she stops eating or cannot think clearly enough to take care of herself. Previous Psychotropic Medications: Yes  Psychological Evaluations: Yes  Past Medical History:  Past Medical History:  Diagnosis Date  . Back pain   . Cancer ([place])    skin  . Depression    BAD  . Gastroparesis   . GERD (gastroesophageal reflux disease)   . Hypothyroidism   . Insomnia   . Migraines   . Parkinson disease ([place])    per [place] [facility], dx'd 2019  . Recurrent cold sores   . Shoulder bursitis   . Urge incontinence     Past Surgical History:  Procedure Laterality Date  . ABDOMINAL HYSTERECTOMY  1999   total  . APPENDECTOMY     . ESOPHAGOGASTRODUODENOSCOPY  01/2006   negative except small hiatal hernia  . ESOPHAGOGASTRODUODENOSCOPY  02/24/2015   See report  . LAPAROSCOPY     for endometriosis  . OVARIAN CYST REMOVAL  1990   unilateral  . TONSILLECTOMY AND ADENOIDECTOMY     Family History:  Family History  Problem Relation Age of Onset  . Hypertension Mother   . Arthritis Mother   . Diabetes Mother   . Stroke Mother   . Cancer Father        lung  . Colon cancer Neg Hx   . Breast cancer Neg Hx    Family Psychiatric  History: Unknown Tobacco Screening:   Social History:  Social History   Substance and Sexual Activity  Alcohol Use No  . Alcohol/week: 0.0 standard drinks     Social History   Substance and Sexual Activity  Drug Use No    Additional Social History: Marital status: Divorced Divorced, when?: 2000 What types of issues is patient dealing with in the relationship?: Pt reports "a nervous breakdown".  Are you sexually active?: No What is your sexual orientation?: Heterosexual Has your sexual activity been affected by drugs, alcohol, medication, or emotional stress?: No Does patient have children?: Yes How many children?: 2 How is patient's relationship with their children?: Pt shared that she is very close with her daugther and "loves her son".  He has struggled with addiction for several years.  He is currently in a federal prison.    Pain Medications: See PTA Prescriptions: See PTA Over the Counter: See PTA History of alcohol / drug use?: No history of alcohol / drug abuse Longest period of sobriety (when/how long): Report of no past or current use Negative Consequences of Use: (N/A) Withdrawal Symptoms: (N/A)                    Allergies:   Allergies  Allergen Reactions  . Cymbalta [Duloxetine Hcl]   . Valproic Acid Shortness Of Breath  . Aspirin     REACTION: UNSPECIFIED  . Atorvastatin Other (See Comments)    Tongue swelling, legs cramping  . Baclofen      REACTION: Throat swells up  . Codeine     REACTION: UNSPECIFIED  . Duloxetine Other (See Comments)    Vision loss, weight loss  . Erythromycin     REACTION: UNSPECIFIED  . Gabapentin  REACTION: throat swells up  . Metoclopramide Hcl     REACTION: states "messed me up"  . Nsaids Other (See Comments)    Stomach problems  . Nucynta Er [Tapentadol Hcl Er]     Intolerant, see note from 07/24/12.    Gean Birchwood [Tapentadol]     AMS  . Prednisone     GI intolance  . Pregabalin   . Seroquel [Quetiapine Fumerate] Other (See Comments)    Loss control, felt like on another planet  . Sulfa Antibiotics Nausea And Vomiting  . Topamax Other (See Comments)    Blisters in mouth and tongue  . Vicodin [Hydrocodone-Acetaminophen]     Nausea, thrush and constipation   Lab Results:  Results for orders placed or performed during the hospital encounter of 04/24/19 (from the past 48 hour(s))  CBC with Differential     Status: None   Collection Time: 04/24/19  1:20 PM  Result Value Ref Range   WBC 9.6 4.0 - 10.5 K/uL   RBC 4.25 3.87 - 5.11 MIL/uL   Hemoglobin 13.9 12.0 - 15.0 g/dL   HCT 40.8 36.0 - 46.0 %   MCV 96.0 80.0 - 100.0 fL   MCH 32.7 26.0 - 34.0 pg   MCHC 34.1 30.0 - 36.0 g/dL   RDW 14.4 11.5 - 15.5 %   Platelets 242 150 - 400 K/uL   nRBC 0.0 0.0 - 0.2 %   Neutrophils Relative % 62 %   Neutro Abs 6.1 1.7 - 7.7 K/uL   Lymphocytes Relative 28 %   Lymphs Abs 2.7 0.7 - 4.0 K/uL   Monocytes Relative 7 %   Monocytes Absolute 0.7 0.1 - 1.0 K/uL   Eosinophils Relative 2 %   Eosinophils Absolute 0.2 0.0 - 0.5 K/uL   Basophils Relative 1 %   Basophils Absolute 0.1 0.0 - 0.1 K/uL   Immature Granulocytes 0 %   Abs Immature Granulocytes 0.01 0.00 - 0.07 K/uL    Comment: Performed at [facility], [address] [place], [place] [address]  Comprehensive metabolic panel     Status: None   Collection Time: 04/24/19  1:20 PM  Result Value Ref Range   Sodium 143 135 - 145 mmol/L    Potassium 3.8 3.5 - 5.1 mmol/L   Chloride 110 98 - 111 mmol/L   CO2 26 22 - 32 mmol/L   Glucose, Bld 96 70 - 99 mg/dL   BUN 13 6 - 20 mg/dL   Creatinine, Ser 0.70 0.44 - 1.00 mg/dL   Calcium 10.3 8.9 - 10.3 mg/dL   Total Protein 6.5 6.5 - 8.1 g/dL   Albumin 3.7 3.5 - 5.0 g/dL   AST 30 15 - 41 U/L   ALT 21 0 - 44 U/L   Alkaline Phosphatase 76 38 - 126 U/L   Total Bilirubin 0.7 0.3 - 1.2 mg/dL   GFR calc non Af Amer >60 >60 mL/min   GFR calc Af Amer >60 >60 mL/min   Anion gap 7 5 - 15    Comment: Performed at [facility], [address] [place], [place] [address]  Ethanol     Status: None   Collection Time: 04/24/19  1:20 PM  Result Value Ref Range   Alcohol, Ethyl (B) <10 <10 mg/dL    Comment: (NOTE) Lowest detectable limit for serum alcohol is 10 mg/dL. For medical purposes only. Performed at [facility], [address] [place], [place] [address]     Blood  Alcohol level:  Lab Results  Component Value Date   ETH <10 04/24/2019   ETH 45 (H) 59/74/1638    Metabolic Disorder Labs:  Lab Results  Component Value Date   HGBA1C 5.4 10/13/2018   MPG 108.28 10/13/2018   MPG 111.15 08/29/2018   No results found for: PROLACTIN Lab Results  Component Value Date   CHOL 168 10/13/2018   TRIG 130 10/13/2018   HDL 39 (L) 10/13/2018   CHOLHDL 4.3 10/13/2018   VLDL 26 10/13/2018   LDLCALC 103 (H) 10/13/2018   LDLCALC 107 (H) 08/29/2018    Current Medications: Current Facility-Administered Medications  Medication Dose Route Frequency Provider Last Rate Last Dose  . acetaminophen (TYLENOL) tablet 650 mg  650 mg Oral Q6H PRN Lavella Hammock, MD      . albuterol (VENTOLIN HFA) 108 (90 Base) MCG/ACT inhaler 2 puff  2 puff Inhalation Q6H PRN Lavella Hammock, MD      . alum & mag hydroxide-simeth (MAALOX/MYLANTA) 200-200-20 MG/5ML suspension 30 mL  30 mL Oral Q4H PRN Lavella Hammock, MD      . cholecalciferol (VITAMIN D) tablet 1,000 Units  1,000 Units  Oral Daily Lavella Hammock, MD   1,000 Units at 04/25/19 [phone]  . feeding supplement (ENSURE ENLIVE) (ENSURE ENLIVE) liquid 237 mL  237 mL Oral BID BM [name], [name], MD      . hydrocerin (EUCERIN) cream   Topical BID [name], [name], MD      . hydrOXYzine (ATARAX/VISTARIL) tablet 50 mg  50 mg Oral TID PRN Lavella Hammock, MD      . lamoTRIgine (LAMICTAL) tablet 25 mg  25 mg Oral Daily Lavella Hammock, MD   25 mg at 04/25/19 [phone]  . levothyroxine (SYNTHROID) tablet 88 mcg  88 mcg Oral Daily Lavella Hammock, MD   88 mcg at 04/25/19 0601  . OLANZapine zydis (ZYPREXA) disintegrating tablet 10 mg  10 mg Oral Q8H PRN Lavella Hammock, MD       And  . LORazepam (ATIVAN) tablet 1 mg  1 mg Oral PRN Lavella Hammock, MD       And  . ziprasidone (GEODON) injection 20 mg  20 mg Intramuscular PRN Lavella Hammock, MD      . magnesium hydroxide (MILK OF MAGNESIA) suspension 30 mL  30 mL Oral Daily PRN Lavella Hammock, MD      . multivitamin with minerals tablet 1 tablet  1 tablet Oral Daily Lavella Hammock, MD   1 tablet at 04/25/19 [phone]  . OLANZapine zydis (ZYPREXA) disintegrating tablet 15 mg  15 mg Oral QHS [name], [name], MD      . omega-3 acid ethyl esters (LOVAZA) capsule 1 g  1 g Oral Daily Lavella Hammock, MD   1 g at 04/25/19 [phone]  . ondansetron (ZOFRAN-ODT) disintegrating tablet 4 mg  4 mg Oral TID PRN Lavella Hammock, MD      . oxyCODONE (Oxy IR/ROXICODONE) immediate release tablet 5 mg  5 mg Oral Q8H PRN Lavella Hammock, MD   5 mg at 04/25/19 [phone]  . pantoprazole (PROTONIX) EC tablet 40 mg  40 mg Oral Daily Lavella Hammock, MD   40 mg at 04/25/19 7048  . senna-docusate (Senokot-S) tablet 2 tablet  2 tablet Oral Daily Lavella Hammock, MD   2 tablet at 04/25/19 [phone]  . tiZANidine (ZANAFLEX) tablet 4 mg  4 mg Oral TID Lavella Hammock,  MD   4 mg at 04/25/19 0843  . triamcinolone cream (KENALOG) 0.1 % 1 application  1 application Topical BID Lavella Hammock, MD      . valACYclovir  Estell Harpin) tablet 500 mg  500 mg Oral BID Lavella Hammock, MD   500 mg at 04/25/19 [phone]  . vitamin A capsule 10,000 Units  10,000 Units Oral Daily Lavella Hammock, MD   10,000 Units at 04/25/19 0848  . vitamin B-12 (CYANOCOBALAMIN) tablet 1,000 mcg  1,000 mcg Oral Daily Lavella Hammock, MD   1,000 mcg at 04/25/19 0848  . vitamin C (ASCORBIC ACID) tablet 500 mg  500 mg Oral Daily Lavella Hammock, MD   500 mg at 04/25/19 0848  . vitamin E capsule 200 Units  200 Units Oral Daily Lavella Hammock, MD   200 Units at 04/25/19 [phone]   PTA Medications: Medications Prior to Admission  Medication Sig Dispense Refill Last Dose  . albuterol (PROVENTIL HFA;VENTOLIN HFA) 108 (90 Base) MCG/ACT inhaler Inhale 2 puffs into the lungs every 6 (six) hours as needed for wheezing or shortness of breath. 1 Inhaler 0 Unknown at PRN  . ascorbic acid (VITAMIN C) 500 MG tablet Take 500 mg by mouth daily.   Past Week at Unknown time  . atorvastatin (LIPITOR) 20 MG tablet Take 1 tablet (20 mg total) by mouth daily. 90 tablet 3 Past Week at Unknown time  . Biotin w/ Vitamins C & E (HAIR SKIN & NAILS GUMMIES) 1250-7.5-7.5 MCG-MG-UNT CHEW Chew by mouth daily.   Past Week at Unknown time  . buPROPion (WELLBUTRIN XL) 150 MG 24 hr tablet Take 1 tablet (150 mg total) by mouth daily. (Patient not taking: Reported on 04/24/2019) 30 tablet . Not Taking at Unknown time  . cholecalciferol (VITAMIN D) 1000 units tablet Take 1 tablet (1,000 Units total) by mouth daily.   Past Week at Unknown time  . lamoTRIgine (LAMICTAL) 25 MG tablet Take 2 tablets (50 mg total) by mouth daily. 60 tablet 0 Past Week at Unknown time  . levothyroxine (SYNTHROID, LEVOTHROID) 88 MCG tablet Take 1 tablet (88 mcg total) by mouth daily. 90 tablet 1 Past Week at Unknown time  . LORazepam (ATIVAN) 0.5 MG tablet Take 0.5 mg by mouth 2 (two) times daily.   Unknown at PRN  . magic mouthwash SOLN Take 5 mLs by mouth 3 (three) times daily as needed for mouth pain.  (Patient not taking: Reported on 04/24/2019) 200 mL 0 Completed Course at Unknown time  . Multiple Vitamin (MULTIVITAMIN) tablet Take 1 tablet by mouth daily.     Past Week at Unknown time  . Omega-3 Fatty Acids (FISH OIL) 1000 MG CAPS Take 1 capsule (1,000 mg total) by mouth daily.   Past Week at Unknown time  . omeprazole (PRILOSEC) 20 MG capsule TAKE 1 CAPSULE BY MOUTH EVERY MORNING 45 MINUTES BEFORE BREAKFAST (Patient taking differently: Take 20 mg by mouth daily. ) 90 capsule 1 Past Week at Unknown time  . ondansetron (ZOFRAN-ODT) 4 MG disintegrating tablet DISSOLVE 1 TABLET(4 MG) ON THE TONGUE THREE TIMES DAILY AS NEEDED FOR NAUSEA OR VOMITING (Patient taking differently: Take 4 mg by mouth 3 (three) times daily as needed for nausea or vomiting. ) 20 tablet 0 Unknown at PRN  . OVER THE COUNTER MEDICATION [place] daily for menopausal symptoms.   Unknown at Unknown  . oxyCODONE (ROXICODONE) 5 MG immediate release tablet Take 1 tablet (5 mg total) by mouth every  8 (eight) hours as needed. (Patient taking differently: Take 5 mg by mouth 6 (six) times daily. ) 5 tablet 0 Past Week at Unknown time  . senna-docusate (SENOKOT-S) 8.6-50 MG tablet Take 2 tablets by mouth daily.   Past Week at Unknown time  . tiZANidine (ZANAFLEX) 4 MG tablet Take 1 tablet (4 mg total) by mouth 3 (three) times daily. 90 tablet 1 Unknown at PRN  . triamcinolone cream (KENALOG) 0.1 % Apply 1 application topically 2 (two) times daily. On the hands if needed. 30 g 0 Unknown at PRN  . valACYclovir (VALTREX) 500 MG tablet TAKE 1 TABLET(500 MG) BY MOUTH TWICE DAILY AS NEEDED (Patient taking differently: Take 500 mg by mouth 2 (two) times daily. ) 60 tablet 0 Past Week at Unknown time  . vitamin A (VITAMIN A) 10000 UNIT capsule Take 10,000 Units by mouth daily.   Unknown at Unknown  . vitamin B-12 (CYANOCOBALAMIN) 1000 MCG tablet Take 1,000 mcg by mouth daily.   Unknown at Unknown  . vitamin E 200 UNIT capsule Take 200 Units by  mouth daily.     Unknown at Unknown    Musculoskeletal: Strength & Muscle Tone: within normal limits Gait & Station: unsteady, I think she may be a little dehydrated.  She looks a little unsteady when she first stands up and has to be encouraged to hold onto something but she has not yet had a fall. Patient leans: N/A  Psychiatric Specialty Exam: Physical Exam  Nursing note and vitals reviewed. Constitutional: She appears well-developed and well-nourished.  HENT:  Head: Normocephalic and atraumatic.  Eyes: Pupils are equal, round, and reactive to light. Conjunctivae are normal.  Neck: Normal range of motion.  Cardiovascular: Regular rhythm and normal heart sounds.  Respiratory: Effort normal.  GI: Soft.  Musculoskeletal: Normal range of motion.  Neurological: She is alert.  Skin: Skin is warm and dry.  Psychiatric: Her mood appears anxious. Her affect is not blunt. Her speech is rapid and/or pressured and tangential. She is agitated. She is not aggressive. Thought content is paranoid and delusional. Cognition and memory are impaired. She expresses impulsivity. She expresses suicidal ideation. She expresses no suicidal plans.    Review of Systems  Constitutional: Negative.   HENT: Negative.   Eyes: Negative.   Respiratory: Negative.   Cardiovascular: Negative.   Gastrointestinal: Negative.   Musculoskeletal: Negative.   Skin: Negative.   Neurological: Negative.   Psychiatric/Behavioral: Positive for depression, memory loss and suicidal ideas. Negative for hallucinations and substance abuse. The patient is nervous/anxious and has insomnia.     Blood pressure (!) 135/97, pulse 88, temperature 98.1 F (36.7 C), temperature source Oral, resp. rate 18, height 4\' 11"  (1.499 m), weight 41.3 kg, SpO2 98 %.Body mass index is 18.38 kg/m.  General Appearance: Disheveled  Eye Contact:  Fair  Speech:  Garbled and Pressured  Volume:  Increased  Mood:  Anxious, Depressed, Hopeless and  Irritable  Affect:  Congruent and Labile  Thought Process:  Disorganized  Orientation:  Negative  Thought Content:  Illogical, Delusions, Paranoid Ideation, Rumination and Tangential  Suicidal Thoughts:  Yes.  without intent/plan  Homicidal Thoughts:  No  Memory:  Immediate;   Fair Recent;   Poor Remote;   Poor  Judgement:  Poor  Insight:  Lacking  Psychomotor Activity:  Restlessness  Concentration:  Concentration: Poor  Recall:  Poor  Fund of Knowledge:  Poor  Language:  Poor  Akathisia:  No  Handed:  Right  AIMS (if indicated):     Assets:  Desire for Improvement Financial Resources/Insurance Housing Social Support  ADL's:  Impaired  Cognition:  Impaired,  Mild  Sleep:       Treatment Plan Summary: Daily contact with patient to assess and evaluate symptoms and progress in treatment, Medication management and Plan I will stop her cold [place] from the oxycodone but were cutting it down to 3 doses a day instead of her prescribed 6.  I am discontinuing the Ativan.  I am going to put her on Zyprexa oral disintegrating at night.  I think it is pretty clear that compliance with clozapine outside the hospital is going to be unlikely so we will try for something that she might be more likely to take.  This is also I hope can help her put some weight back on.  I do not think she needs to be on the Wellbutrin currently.  I need to get a check on her TSH and also get an EKG for medication management.  Patient will be given extra food supplements and encouraged to eat.  Included in individual and group therapy.  Try to get in touch with family for collateral information and discharge planning.  I have a telephone call into [facility] to find out whether she is still an active client there.  Observation Level/Precautions:  15 minute checks  Laboratory:  TSH and EKG  Psychotherapy:    Medications:    Consultations:    Discharge Concerns:    Estimated LOS:  Other:      Physician Treatment Plan for Primary Diagnosis: Bipolar I disorder, most recent episode depressed ([place]) Long Term Goal(s): Improvement in symptoms so as ready for discharge  Short Term Goals: Ability to identify changes in lifestyle to reduce recurrence of condition will improve, Ability to verbalize feelings will improve and Ability to disclose and discuss suicidal ideas  Physician Treatment Plan for Secondary Diagnosis: Principal Problem:   Bipolar I disorder, most recent episode depressed ([place]) Active Problems:   Hypothyroidism   Chronic back pain   Bipolar 1 disorder, mixed, severe ([place])   Opiate dependence ([place])  Long Term Goal(s): Improvement in symptoms so as ready for discharge  Short Term Goals: Ability to demonstrate self-control will improve, Ability to identify and develop effective coping behaviors will improve and Ability to maintain clinical measurements within normal limits will improve  I certify that inpatient services furnished can reasonably be expected to improve the patient's condition.    Alethia Berthold, MD 4/30/202011:24 AM

## 2019-04-25 NOTE — Progress Notes (Signed)
PHARMACIST - PHYSICIAN ORDER COMMUNICATION  CONCERNING: P&T Medication Policy on Herbal Medications and Allergy to Statin  DESCRIPTION:  This patient's order for:  Biotin  has been noted.  This product(s) is classified as an "herbal" or natural product. Due to a lack of definitive safety studies or FDA approval, nonstandard manufacturing practices, plus the potential risk of unknown drug-drug interactions while on inpatient medications, the Pharmacy and Therapeutics Committee does not permit the use of "herbal" or natural products of this type within [facility].  Called patient about atorvastatin allergy and she reported tongue swelling and rash. When reviewing history of administered statins, it does not appear that the patient has had a statin since the documented allergy.   ACTION TAKEN: The pharmacy department is unable to verify this order at this time and your patient has been informed of this safety policy. Please reevaluate patient's clinical condition at discharge and address if the herbal or natural product(s) should be resumed at that time.  Atorvastatin was discontinued for this admission.

## 2019-04-25 NOTE — BHH Group Notes (Signed)
Balance In Life 04/25/2019 1PM  Type of Therapy/Topic:  Group Therapy:  Balance in Life  Participation Level:  Did Not Attend  Description of Group:   This group will address the concept of balance and how it feels and looks when one is unbalanced. Patients will be encouraged to process areas in their lives that are out of balance and identify reasons for remaining unbalanced. Facilitators will guide patients in utilizing problem-solving interventions to address and correct the stressor making their life unbalanced. Understanding and applying boundaries will be explored and addressed for obtaining and maintaining a balanced life. Patients will be encouraged to explore ways to assertively make their unbalanced needs known to significant others in their lives, using other group members and facilitator for support and feedback.  Therapeutic Goals: 1. Patient will identify two or more emotions or situations they have that consume much of in their lives. 2. Patient will identify signs/triggers that life has become out of balance:  3. Patient will identify two ways to set boundaries in order to achieve balance in their lives:  4. Patient will demonstrate ability to communicate their needs through discussion and/or role plays  Summary of Patient Progress:    Therapeutic Modalities:   Cognitive Behavioral Therapy Solution-Focused Therapy Assertiveness Training  Carmita Boom Lynelle Smoke, LCSW

## 2019-04-25 NOTE — BHH Group Notes (Signed)
[place] Group Notes:  (Nursing/MHT/Case Management/Adjunct)  Date:  04/25/2019  Time:  8:50 PM  Type of Therapy:  Group Therapy  Participation Level:  Did Not Attend  Summary of Progress/Problems:  Leslie Wong 04/25/2019, 8:50 PM
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
## 2019-04-25 NOTE — Plan of Care (Signed)
Patient newly admitted, hasn't had time to progress.   Problem: Education: Goal: Knowledge of [facility] Education information/materials will improve Outcome: Not Progressing   Problem: Coping: Goal: Coping ability will improve Outcome: Not Progressing Goal: Will verbalize feelings Outcome: Not Progressing   Problem: Safety: Goal: Ability to redirect hostility and anger into socially appropriate behaviors will improve Outcome: Not Progressing Goal: Ability to remain free from injury will improve Outcome: Not Progressing

## 2019-04-25 NOTE — BHH Suicide Risk Assessment (Signed)
[place] INPATIENT:  Family/Significant Other Suicide Prevention Education  Suicide Prevention Education:  Contact Attempts: Leslie Wong, daughter, [phone] has been identified by the patient as the family member/significant other with whom the patient will be residing, and identified as the person(s) who will aid the patient in the event of a mental health crisis.  With written consent from the patient, two attempts were made to provide suicide prevention education, prior to and/or following the patient's discharge.  We were unsuccessful in providing suicide prevention education.  A suicide education pamphlet was given to the patient to share with family/significant other.  Date and time of first attempt: 04/25/2019 at 11:26am Date and time of second attempt: Second attempt is needed  Leslie Wong 04/25/2019, 11:25 AM

## 2019-04-25 NOTE — Tx Team (Addendum)
Interdisciplinary Treatment and Diagnostic Plan Update  04/25/2019 Time of Session: 10:30AM  Leslie Wong MRN: [id number]  Principal Diagnosis: Bipolar I disorder, most recent episode depressed ([name])  Secondary Diagnoses: Principal Problem:   Bipolar I disorder, most recent episode depressed ([name]) Active Problems:   Hypothyroidism   Chronic back pain   Bipolar 1 disorder, mixed, severe ([name])   Opiate dependence ([name])   Current Medications:  Current Facility-Administered Medications  Medication Dose Route Frequency Provider Last Rate Last Dose  . acetaminophen (TYLENOL) tablet 650 mg  650 mg Oral Q6H PRN Lavella Hammock, MD      . albuterol (VENTOLIN HFA) 108 (90 Base) MCG/ACT inhaler 2 puff  2 puff Inhalation Q6H PRN Lavella Hammock, MD      . alum & mag hydroxide-simeth (MAALOX/MYLANTA) 200-200-20 MG/5ML suspension 30 mL  30 mL Oral Q4H PRN Lavella Hammock, MD      . cholecalciferol (VITAMIN D) tablet 1,000 Units  1,000 Units Oral Daily Lavella Hammock, MD   1,000 Units at 04/25/19 [phone]  . feeding supplement (ENSURE ENLIVE) (ENSURE ENLIVE) liquid 237 mL  237 mL Oral BID BM [name], [name], MD      . hydrocerin (EUCERIN) cream   Topical BID [name], [name], MD      . hydrOXYzine (ATARAX/VISTARIL) tablet 50 mg  50 mg Oral TID PRN Lavella Hammock, MD      . lamoTRIgine (LAMICTAL) tablet 25 mg  25 mg Oral Daily Lavella Hammock, MD   25 mg at 04/25/19 [phone]  . levothyroxine (SYNTHROID) tablet 88 mcg  88 mcg Oral Daily Lavella Hammock, MD   88 mcg at 04/25/19 0601  . OLANZapine zydis (ZYPREXA) disintegrating tablet 10 mg  10 mg Oral Q8H PRN Lavella Hammock, MD       And  . LORazepam (ATIVAN) tablet 1 mg  1 mg Oral PRN Lavella Hammock, MD       And  . ziprasidone (GEODON) injection 20 mg  20 mg Intramuscular PRN Lavella Hammock, MD      . magnesium hydroxide (MILK OF MAGNESIA) suspension 30 mL  30 mL Oral Daily PRN Lavella Hammock, MD      . multivitamin with minerals  tablet 1 tablet  1 tablet Oral Daily Lavella Hammock, MD   1 tablet at 04/25/19 [phone]  . OLANZapine zydis (ZYPREXA) disintegrating tablet 15 mg  15 mg Oral QHS [name], [name], MD      . omega-3 acid ethyl esters (LOVAZA) capsule 1 g  1 g Oral Daily Lavella Hammock, MD   1 g at 04/25/19 [phone]  . ondansetron (ZOFRAN-ODT) disintegrating tablet 4 mg  4 mg Oral TID PRN Lavella Hammock, MD      . oxyCODONE (Oxy IR/ROXICODONE) immediate release tablet 5 mg  5 mg Oral Q8H PRN Lavella Hammock, MD   5 mg at 04/25/19 [phone]  . pantoprazole (PROTONIX) EC tablet 40 mg  40 mg Oral Daily Lavella Hammock, MD   40 mg at 04/25/19 3785  . senna-docusate (Senokot-S) tablet 2 tablet  2 tablet Oral Daily Lavella Hammock, MD   2 tablet at 04/25/19 [phone]  . tiZANidine (ZANAFLEX) tablet 4 mg  4 mg Oral TID Lavella Hammock, MD   4 mg at 04/25/19 [phone]  . triamcinolone cream (KENALOG) 0.1 % 1 application  1 application Topical BID Lavella Hammock, MD      .  valACYclovir (VALTREX) tablet 500 mg  500 mg Oral BID Lavella Hammock, MD   500 mg at 04/25/19 0843  . vitamin A capsule 10,000 Units  10,000 Units Oral Daily Lavella Hammock, MD   10,000 Units at 04/25/19 0848  . vitamin B-12 (CYANOCOBALAMIN) tablet 1,000 mcg  1,000 mcg Oral Daily Lavella Hammock, MD   1,000 mcg at 04/25/19 0848  . vitamin C (ASCORBIC ACID) tablet 500 mg  500 mg Oral Daily Lavella Hammock, MD   500 mg at 04/25/19 0848  . vitamin E capsule 200 Units  200 Units Oral Daily Lavella Hammock, MD   200 Units at 04/25/19 [phone]   PTA Medications: Medications Prior to Admission  Medication Sig Dispense Refill Last Dose  . albuterol (PROVENTIL HFA;VENTOLIN HFA) 108 (90 Base) MCG/ACT inhaler Inhale 2 puffs into the lungs every 6 (six) hours as needed for wheezing or shortness of breath. 1 Inhaler 0 Unknown at PRN  . ascorbic acid (VITAMIN C) 500 MG tablet Take 500 mg by mouth daily.   Past Week at Unknown time  . atorvastatin (LIPITOR) 20 MG tablet Take 1  tablet (20 mg total) by mouth daily. 90 tablet 3 Past Week at Unknown time  . Biotin w/ Vitamins C & E (HAIR SKIN & NAILS GUMMIES) 1250-7.5-7.5 MCG-MG-UNT CHEW Chew by mouth daily.   Past Week at Unknown time  . buPROPion (WELLBUTRIN XL) 150 MG 24 hr tablet Take 1 tablet (150 mg total) by mouth daily. (Patient not taking: Reported on 04/24/2019) 30 tablet . Not Taking at Unknown time  . cholecalciferol (VITAMIN D) 1000 units tablet Take 1 tablet (1,000 Units total) by mouth daily.   Past Week at Unknown time  . lamoTRIgine (LAMICTAL) 25 MG tablet Take 2 tablets (50 mg total) by mouth daily. 60 tablet 0 Past Week at Unknown time  . levothyroxine (SYNTHROID, LEVOTHROID) 88 MCG tablet Take 1 tablet (88 mcg total) by mouth daily. 90 tablet 1 Past Week at Unknown time  . LORazepam (ATIVAN) 0.5 MG tablet Take 0.5 mg by mouth 2 (two) times daily.   Unknown at PRN  . magic mouthwash SOLN Take 5 mLs by mouth 3 (three) times daily as needed for mouth pain. (Patient not taking: Reported on 04/24/2019) 200 mL 0 Completed Course at Unknown time  . Multiple Vitamin (MULTIVITAMIN) tablet Take 1 tablet by mouth daily.     Past Week at Unknown time  . Omega-3 Fatty Acids (FISH OIL) 1000 MG CAPS Take 1 capsule (1,000 mg total) by mouth daily.   Past Week at Unknown time  . omeprazole (PRILOSEC) 20 MG capsule TAKE 1 CAPSULE BY MOUTH EVERY MORNING 45 MINUTES BEFORE BREAKFAST (Patient taking differently: Take 20 mg by mouth daily. ) 90 capsule 1 Past Week at Unknown time  . ondansetron (ZOFRAN-ODT) 4 MG disintegrating tablet DISSOLVE 1 TABLET(4 MG) ON THE TONGUE THREE TIMES DAILY AS NEEDED FOR NAUSEA OR VOMITING (Patient taking differently: Take 4 mg by mouth 3 (three) times daily as needed for nausea or vomiting. ) 20 tablet 0 Unknown at PRN  . OVER THE COUNTER MEDICATION [place] daily for menopausal symptoms.   Unknown at Unknown  . oxyCODONE (ROXICODONE) 5 MG immediate release tablet Take 1 tablet (5 mg total) by  mouth every 8 (eight) hours as needed. (Patient taking differently: Take 5 mg by mouth 6 (six) times daily. ) 5 tablet 0 Past Week at Unknown time  . senna-docusate (SENOKOT-S) 8.6-50 MG  tablet Take 2 tablets by mouth daily.   Past Week at Unknown time  . tiZANidine (ZANAFLEX) 4 MG tablet Take 1 tablet (4 mg total) by mouth 3 (three) times daily. 90 tablet 1 Unknown at PRN  . triamcinolone cream (KENALOG) 0.1 % Apply 1 application topically 2 (two) times daily. On the hands if needed. 30 g 0 Unknown at PRN  . valACYclovir (VALTREX) 500 MG tablet TAKE 1 TABLET(500 MG) BY MOUTH TWICE DAILY AS NEEDED (Patient taking differently: Take 500 mg by mouth 2 (two) times daily. ) 60 tablet 0 Past Week at Unknown time  . vitamin A (VITAMIN A) 10000 UNIT capsule Take 10,000 Units by mouth daily.   Unknown at Unknown  . vitamin B-12 (CYANOCOBALAMIN) 1000 MCG tablet Take 1,000 mcg by mouth daily.   Unknown at Unknown  . vitamin E 200 UNIT capsule Take 200 Units by mouth daily.     Unknown at Unknown    Patient Stressors: Health problems Medication change or noncompliance Substance abuse  Patient Strengths: Ability for insight Capable of independent living Communication skills  Treatment Modalities: Medication Management, Group therapy, Case management,  1 to 1 session with clinician, Psychoeducation, Recreational therapy.   Physician Treatment Plan for Primary Diagnosis: Bipolar I disorder, most recent episode depressed ([place]) Long Term Goal(s):     Short Term Goals:    Medication Management: Evaluate patient's response, side effects, and tolerance of medication regimen.  Therapeutic Interventions: 1 to 1 sessions, Unit Group sessions and Medication administration.  Evaluation of Outcomes: Not Progressing  Physician Treatment Plan for Secondary Diagnosis: Principal Problem:   Bipolar I disorder, most recent episode depressed ([place]) Active Problems:   Hypothyroidism   Chronic back pain   Bipolar  1 disorder, mixed, severe ([place])   Opiate dependence ([place])  Long Term Goal(s):     Short Term Goals:       Medication Management: Evaluate patient's response, side effects, and tolerance of medication regimen.  Therapeutic Interventions: 1 to 1 sessions, Unit Group sessions and Medication administration.  Evaluation of Outcomes: Not Progressing   RN Treatment Plan for Primary Diagnosis: Bipolar I disorder, most recent episode depressed ([place]) Long Term Goal(s): Knowledge of disease and therapeutic regimen to maintain health will improve  Short Term Goals: Ability to verbalize frustration and anger appropriately will improve, Ability to demonstrate self-control, Ability to participate in decision making will improve, Ability to verbalize feelings will improve, Ability to identify and develop effective coping behaviors will improve and Compliance with prescribed medications will improve  Medication Management: RN will administer medications as ordered by provider, will assess and evaluate patient's response and provide education to patient for prescribed medication. RN will report any adverse and/or side effects to prescribing provider.  Therapeutic Interventions: 1 on 1 counseling sessions, Psychoeducation, Medication administration, Evaluate responses to treatment, Monitor vital signs and CBGs as ordered, Perform/monitor CIWA, COWS, AIMS and Fall Risk screenings as ordered, Perform wound care treatments as ordered.  Evaluation of Outcomes: Not Progressing   LCSW Treatment Plan for Primary Diagnosis: Bipolar I disorder, most recent episode depressed ([place]) Long Term Goal(s): Safe transition to appropriate next level of care at discharge, Engage patient in therapeutic group addressing interpersonal concerns.  Short Term Goals: Engage patient in aftercare planning with referrals and resources, Increase social support, Increase ability to appropriately verbalize feelings, Increase emotional  regulation and Facilitate acceptance of mental health diagnosis and concerns  Therapeutic Interventions: Assess for all discharge needs, 1 to 1 time with  Social worker, Explore available resources and support systems, Assess for adequacy in community support network, Educate family and significant other(s) on suicide prevention, Complete Psychosocial Assessment, Interpersonal group therapy.  Evaluation of Outcomes: Not Progressing   Progress in Treatment: Attending groups: No. Participating in groups: No. Taking medication as prescribed: Yes. Toleration medication: Yes. Family/Significant other contact made: No, will contact:  once permission given. Patient understands diagnosis: Yes. Discussing patient identified problems/goals with staff: Yes. Medical problems stabilized or resolved: Yes. Denies suicidal/homicidal ideation: Yes. Issues/concerns per patient self-inventory: No. Other: none  New problem(s) identified: No, Describe:  none  New Short Term/Long Term Goal(s):  elimination of symptoms of psychosis, medication management for mood stabilization; development of comprehensive mental wellness plan  Patient Goals:  "get well and get the heck out of here"  Discharge Plan or Barriers: Pt reports no barriers at this time. Pt reports that she plans on following up with her current mental health provider [facility] in [place].  Pt reports that she will be able to return home.   Reason for Continuation of Hospitalization: Anxiety Delusions  Depression Medical Issues Medication stabilization  Estimated Length of Stay: 1-5 days  Recreational Therapy: Patient Stressors: N/A  Patient Goal: Patient will engage in groups without prompting or encouragement from LRT x3 group sessions within 5 recreation therapy group sessions  Attendees: Patient: Leslie Wong 04/25/2019 11:14 AM  Physician: Dr. Weber Cooks, MD 04/25/2019 11:14 AM  Nursing: Jerry Caras, RN 04/25/2019 11:14 AM  RN  Care Manager: 04/25/2019 11:14 AM  Social Worker: Assunta Curtis, LCSW 04/25/2019 11:14 AM  Recreational Therapist: Roanna Epley, Reather Converse, LRT 04/25/2019 11:14 AM  Other: Sanjuana Kava, LCSW 04/25/2019 11:14 AM  Other: Evalina Field, LCSW 04/25/2019 11:14 AM  Other: 04/25/2019 11:14 AM    Scribe for Treatment Team: Rozann Lesches, LCSW 04/25/2019 11:14 AM

## 2019-04-26 MED ORDER — LAMOTRIGINE 25 MG PO TABS
50.0000 mg | ORAL_TABLET | Freq: Every day | ORAL | Status: DC
  Administered 2019-04-27 – 2019-04-29 (×3): 50 mg via ORAL
  Filled 2019-04-26 (×3): qty 2

## 2019-04-26 NOTE — Progress Notes (Signed)
[facility] MD Progress Note  04/26/2019 2:48 PM Leslie Wong  MRN:  [id number] Subjective: Follow-up note for this 63 year old woman with bipolar disorder.  Patient seen chart reviewed.  Spoke with the patient's daughter as well.  Patient says she is feeling slightly better today although she is still very nervous and feels down and negative and overly worried.  She slept better last night with her medicine and has eaten her meals today.  Her thoughts seem to be more organized although she still has a hard time explaining completely what is going on with her.  There is no evidence that she is having any negative side effects.  I do not see any tremulousness or stiffness in particular with her other than the stiffness from her arthritis in her neck.  The daughter believes all of her mother's claims which I think are probably mostly untrue egg for example that she has Parkinson's disease and that antipsychotic medications make her worse.  Daughter however clearly has her mother's best interest at heart. Principal Problem: Bipolar I disorder, most recent episode depressed ([place]) Diagnosis: Principal Problem:   Bipolar I disorder, most recent episode depressed ([place]) Active Problems:   Hypothyroidism   Chronic back pain   Bipolar 1 disorder, mixed, severe ([place])   Opiate dependence ([place])  Total Time spent with patient: 30 minutes  Past Psychiatric History: Patient has a history of bipolar disorder several prior admissions with psychosis.  The last time we had her here she was agitated and disorganized and psychotic and finally got better with clozapine.  This time she was disorganized and psychotic but seemed more depressed.  Had lost a lot of weight.  Complicating things is that the patient has chronic pain for which she is prescribed relatively high doses of chronic narcotic and also receives chronic benzodiazepine which she mixes with it.  Past Medical History:  Past Medical History:  Diagnosis  Date  . Back pain   . Cancer ([place])    skin  . Depression    BAD  . Gastroparesis   . GERD (gastroesophageal reflux disease)   . Hypothyroidism   . Insomnia   . Migraines   . Parkinson disease ([place])    per [facility], dx'd 2019  . Recurrent cold sores   . Shoulder bursitis   . Urge incontinence     Past Surgical History:  Procedure Laterality Date  . ABDOMINAL HYSTERECTOMY  1999   total  . APPENDECTOMY    . ESOPHAGOGASTRODUODENOSCOPY  01/2006   negative except small hiatal hernia  . ESOPHAGOGASTRODUODENOSCOPY  02/24/2015   See report  . LAPAROSCOPY     for endometriosis  . OVARIAN CYST REMOVAL  1990   unilateral  . TONSILLECTOMY AND ADENOIDECTOMY     Family History:  Family History  Problem Relation Age of Onset  . Hypertension Mother   . Arthritis Mother   . Diabetes Mother   . Stroke Mother   . Cancer Father        lung  . Colon cancer Neg Hx   . Breast cancer Neg Hx    Family Psychiatric  History: None known as far as psychiatry Social History:  Social History   Substance and Sexual Activity  Alcohol Use No  . Alcohol/week: 0.0 standard drinks     Social History   Substance and Sexual Activity  Drug Use No    Social History   Socioeconomic History  . Marital status: Divorced    Spouse name:  Not on file  . Number of children: Not on file  . Years of education: Not on file  . Highest education level: Not on file  Occupational History  . Not on file  Social Needs  . Financial resource strain: Not on file  . Food insecurity:    Worry: Not on file    Inability: Not on file  . Transportation needs:    Medical: Not on file    Non-medical: Not on file  Tobacco Use  . Smoking status: Former Smoker    Packs/day: 1.00    Years: 42.50    Pack years: 42.50    Types: Cigarettes  . Smokeless tobacco: Former Network engineer and Sexual Activity  . Alcohol use: No    Alcohol/week: 0.0 standard drinks  . Drug use: No  . Sexual activity: Never   Lifestyle  . Physical activity:    Days per week: Not on file    Minutes per session: Not on file  . Stress: Not on file  Relationships  . Social connections:    Talks on phone: Not on file    Gets together: Not on file    Attends religious service: Not on file    Active member of club or organization: Not on file    Attends meetings of clubs or organizations: Not on file    Relationship status: Not on file  Other Topics Concern  . Not on file  Social History Narrative   Lives alone.     Has a Counsellor   Additional Social History:    Pain Medications: See PTA Prescriptions: See PTA Over the Counter: See PTA History of alcohol / drug use?: No history of alcohol / drug abuse Longest period of sobriety (when/how long): Report of no past or current use Negative Consequences of Use: (N/A) Withdrawal Symptoms: (N/A)                    Sleep: Fair  Appetite:  Fair  Current Medications: Current Facility-Administered Medications  Medication Dose Route Frequency Provider Last Rate Last Dose  . acetaminophen (TYLENOL) tablet 650 mg  650 mg Oral Q6H PRN Lavella Hammock, MD      . albuterol (VENTOLIN HFA) 108 (90 Base) MCG/ACT inhaler 2 puff  2 puff Inhalation Q6H PRN Lavella Hammock, MD      . alum & mag hydroxide-simeth (MAALOX/MYLANTA) 200-200-20 MG/5ML suspension 30 mL  30 mL Oral Q4H PRN Lavella Hammock, MD      . cholecalciferol (VITAMIN D) tablet 1,000 Units  1,000 Units Oral Daily Lavella Hammock, MD   1,000 Units at 04/26/19 0900  . feeding supplement (ENSURE ENLIVE) (ENSURE ENLIVE) liquid 237 mL  237 mL Oral BID BM [name], [name], MD   237 mL at 04/26/19 1100  . hydrocerin (EUCERIN) cream   Topical BID [name], [name], MD      . hydrOXYzine (ATARAX/VISTARIL) tablet 50 mg  50 mg Oral TID PRN Lavella Hammock, MD   50 mg at 04/26/19 1247  . lamoTRIgine (LAMICTAL) tablet 25 mg  25 mg Oral Daily Lavella Hammock, MD   25 mg at 04/26/19 0900  . levothyroxine  (SYNTHROID) tablet 88 mcg  88 mcg Oral Daily Lavella Hammock, MD   88 mcg at 04/26/19 0555  . magnesium hydroxide (MILK OF MAGNESIA) suspension 30 mL  30 mL Oral Daily PRN Lavella Hammock, MD      .  multivitamin with minerals tablet 1 tablet  1 tablet Oral Daily Lavella Hammock, MD   1 tablet at 04/26/19 0900  . OLANZapine zydis (ZYPREXA) disintegrating tablet 10 mg  10 mg Oral Q8H PRN Lavella Hammock, MD       And  . ziprasidone (GEODON) injection 20 mg  20 mg Intramuscular PRN Lavella Hammock, MD      . OLANZapine zydis [facility]) disintegrating tablet 15 mg  15 mg Oral QHS [name], [name], MD   15 mg at 04/25/19 2108  . omega-3 acid ethyl esters (LOVAZA) capsule 1 g  1 g Oral Daily Lavella Hammock, MD   1 g at 04/26/19 0900  . ondansetron (ZOFRAN-ODT) disintegrating tablet 4 mg  4 mg Oral TID PRN Lavella Hammock, MD   4 mg at 04/25/19 2223  . oxyCODONE (Oxy IR/ROXICODONE) immediate release tablet 5 mg  5 mg Oral Q8H PRN Lavella Hammock, MD   5 mg at 04/26/19 1247  . pantoprazole (PROTONIX) EC tablet 40 mg  40 mg Oral Daily Lavella Hammock, MD   40 mg at 04/26/19 0900  . senna-docusate (Senokot-S) tablet 2 tablet  2 tablet Oral Daily Lavella Hammock, MD   2 tablet at 04/26/19 0900  . tiZANidine (ZANAFLEX) tablet 4 mg  4 mg Oral TID Lavella Hammock, MD   4 mg at 04/26/19 1248  . triamcinolone cream (KENALOG) 0.1 % 1 application  1 application Topical BID Lavella Hammock, MD   1 application at 56/31/49 0900  . valACYclovir (VALTREX) tablet 500 mg  500 mg Oral BID Lavella Hammock, MD   500 mg at 04/26/19 0900  . vitamin A capsule 10,000 Units  10,000 Units Oral Daily Lavella Hammock, MD   10,000 Units at 04/26/19 1248  . vitamin B-12 (CYANOCOBALAMIN) tablet 1,000 mcg  1,000 mcg Oral Daily Lavella Hammock, MD   1,000 mcg at 04/26/19 0900  . vitamin C (ASCORBIC ACID) tablet 500 mg  500 mg Oral Daily Lavella Hammock, MD   500 mg at 04/26/19 0900  . vitamin E capsule 200 Units  200 Units  Oral Daily Lavella Hammock, MD   200 Units at 04/26/19 0900    Lab Results:  Results for orders placed or performed during the hospital encounter of 04/24/19 (from the past 48 hour(s))  Urinalysis, Complete w Microscopic     Status: Abnormal   Collection Time: 04/24/19  5:16 PM  Result Value Ref Range   Color, Urine YELLOW (A) YELLOW   APPearance CLOUDY (A) CLEAR   Specific Gravity, Urine 1.010 1.005 - 1.030   pH 7.0 5.0 - 8.0   Glucose, UA NEGATIVE NEGATIVE mg/dL   Hgb urine dipstick NEGATIVE NEGATIVE   Bilirubin Urine NEGATIVE NEGATIVE   Ketones, ur NEGATIVE NEGATIVE mg/dL   Protein, ur NEGATIVE NEGATIVE mg/dL   Nitrite NEGATIVE NEGATIVE   Leukocytes,Ua NEGATIVE NEGATIVE   RBC / HPF 0-5 0 - 5 RBC/hpf   WBC, UA 0-5 0 - 5 WBC/hpf   Bacteria, UA NONE SEEN NONE SEEN   Squamous Epithelial / LPF NONE SEEN 0 - 5   Mucus PRESENT    Amorphous Crystal PRESENT     Comment: Performed at [facility], [address] [place], [place] [address]  Urine Drug Screen, Qualitative ([facility] only)     Status: Abnormal   Collection Time: 04/24/19  5:16 PM  Result Value Ref Range   Tricyclic, Ur  Screen NONE DETECTED NONE DETECTED   Amphetamines, Ur Screen NONE DETECTED NONE DETECTED   MDMA (Ecstasy)Ur Screen NONE DETECTED NONE DETECTED   Cocaine Metabolite,Ur [place] NONE DETECTED NONE DETECTED   Opiate, Ur Screen NONE DETECTED NONE DETECTED   Phencyclidine (PCP) Ur S NONE DETECTED NONE DETECTED   Cannabinoid 50 Ng, Ur [place] POSITIVE (A) NONE DETECTED   Barbiturates, Ur Screen NONE DETECTED NONE DETECTED   Benzodiazepine, Ur Scrn NONE DETECTED NONE DETECTED   Methadone Scn, Ur NONE DETECTED NONE DETECTED    Comment: (NOTE) Tricyclics + metabolites, urine    Cutoff 1000 ng/mL Amphetamines + metabolites, urine  Cutoff 1000 ng/mL MDMA (Ecstasy), urine              Cutoff 500 ng/mL Cocaine Metabolite, urine          Cutoff 300 ng/mL Opiate + metabolites, urine        Cutoff 300  ng/mL Phencyclidine (PCP), urine         Cutoff 25 ng/mL Cannabinoid, urine                 Cutoff 50 ng/mL Barbiturates + metabolites, urine  Cutoff 200 ng/mL Benzodiazepine, urine              Cutoff 200 ng/mL Methadone, urine                   Cutoff 300 ng/mL The urine drug screen provides only a preliminary, unconfirmed analytical test result and should not be used for non-medical purposes. Clinical consideration and professional judgment should be applied to any positive drug screen result due to possible interfering substances. A more specific alternate chemical method must be used in order to obtain a confirmed analytical result. Gas chromatography / mass spectrometry (GC/MS) is the preferred confirmat ory method. Performed at [facility], [address] [place], [place] [address]     Blood Alcohol level:  Lab Results  Component Value Date   ETH <10 04/24/2019   ETH 45 (H) 34/19/6222    Metabolic Disorder Labs: Lab Results  Component Value Date   HGBA1C 5.4 10/13/2018   MPG 108.28 10/13/2018   MPG 111.15 08/29/2018   No results found for: PROLACTIN Lab Results  Component Value Date   CHOL 168 10/13/2018   TRIG 130 10/13/2018   HDL 39 (L) 10/13/2018   CHOLHDL 4.3 10/13/2018   VLDL 26 10/13/2018   LDLCALC 103 (H) 10/13/2018   LDLCALC 107 (H) 08/29/2018    Physical Findings: AIMS:  , ,  ,  ,    CIWA:    COWS:     Musculoskeletal: Strength & Muscle Tone: within normal limits Gait & Station: normal Patient leans: N/A  Psychiatric Specialty Exam: Physical Exam  Nursing note and vitals reviewed. Constitutional: She appears well-developed and well-nourished.  HENT:  Head: Normocephalic and atraumatic.  Eyes: Pupils are equal, round, and reactive to light. Conjunctivae are normal.  Neck: Normal range of motion.  Cardiovascular: Regular rhythm and normal heart sounds.  Respiratory: Effort normal.  GI: Soft.  Musculoskeletal: Normal range of  motion.  Neurological: She is alert.  Skin: Skin is warm and dry.  Psychiatric: Her mood appears anxious. Her speech is tangential. She is agitated. She is not aggressive. Thought content is paranoid. Cognition and memory are impaired. She expresses impulsivity. She expresses no homicidal and no suicidal ideation. She exhibits abnormal recent memory.    Review of Systems  Constitutional: Negative.   HENT:  Negative.   Eyes: Negative.   Respiratory: Negative.   Cardiovascular: Negative.   Gastrointestinal: Negative.   Musculoskeletal: Negative.   Skin: Negative.   Neurological: Negative.   Psychiatric/Behavioral: Positive for depression and memory loss. Negative for hallucinations, substance abuse and suicidal ideas. The patient is nervous/anxious. The patient does not have insomnia.     Blood pressure (!) 132/99, pulse 97, temperature 98.3 F (36.8 C), temperature source Oral, resp. rate 16, height 4\' 11"  (1.499 m), weight 41.3 kg, SpO2 99 %.Body mass index is 18.38 kg/m.  General Appearance: Casual  Eye Contact:  Good  Speech:  Garbled  Volume:  Normal  Mood:  Anxious  Affect:  Congruent  Thought Process:  Disorganized  Orientation:  Full (Time, Place, and Person)  Thought Content:  Illogical, Paranoid Ideation, Rumination and Tangential  Suicidal Thoughts:  Yes.  without intent/plan  Homicidal Thoughts:  No  Memory:  Immediate;   Fair Recent;   Poor Remote;   Poor  Judgement:  Impaired  Insight:  Shallow  Psychomotor Activity:  Normal  Concentration:  Concentration: Fair  Recall:  [employer] of Knowledge:  Fair  Language:  Fair  Akathisia:  No  Handed:  Right  AIMS (if indicated):     Assets:  Desire for Improvement Financial Resources/Insurance Housing Social Support  ADL's:  Impaired  Cognition:  Impaired,  Mild  Sleep:  Number of Hours: 6     Treatment Plan Summary: Daily contact with patient to assess and evaluate symptoms and progress in treatment,  Medication management and Plan Continue medication for psychotic depression and bipolar disorder.  Continue to monitor her eating to try to get her re-fed.  Continue her at the more limited doses of her chronic pain medicines and try to not start benzodiazepines up again.  Praised her attendance at groups.  Hope that she will stabilize soon.  Alethia Berthold, MD 04/26/2019, 2:48 PM

## 2019-04-26 NOTE — Progress Notes (Signed)
D - Patient was in her room upon arrival to the unit. Patient was pleasant during assessment and medication administration. Patient denies SI/HI/AVH. Patient endorses anxiety and depression stating they were both 5/10. Patient was isolative to her room this evening but did state that she had a better day today than yesterday.    A - Patient was compliant with medication administration per MD orders. Patient was given education. Patient given support and encouragement to be active in her treatment plan. Patient informed to let staff know if there are any issues or problems on the unit.   R - Patient being monitored Q 15 minutes for safety per unit protocol. Patient remains safe on the unit.

## 2019-04-26 NOTE — BHH Group Notes (Signed)
[facility] Group Notes:  (Nursing/MHT/Case Management/Adjunct)  Date:  04/26/2019  Time:  9:34 AM  Type of Therapy:  Community Meeting  Participation Level:  Did Not Attend  Summary of Progress/Problems:  Kathi Ludwig 04/26/2019, 9:34 AM

## 2019-04-26 NOTE — BHH Group Notes (Signed)
LCSW Group Therapy Note  04/26/2019 12:13 PM  Type of Therapy and Topic:  Group Therapy:  Feelings around Relapse and Recovery  Participation Level:  Did Not Attend   Description of Group:    Patients in this group will discuss emotions they experience before and after a relapse. They will process how experiencing these feelings, or avoidance of experiencing them, relates to having a relapse. Facilitator will guide patients to explore emotions they have related to recovery. Patients will be encouraged to process which emotions are more powerful. They will be guided to discuss the emotional reaction significant others in their lives may have to their relapse or recovery. Patients will be assisted in exploring ways to respond to the emotions of others without this contributing to a relapse.  Therapeutic Goals: 1. Patient will identify two or more emotions that lead to a relapse for them 2. Patient will identify two emotions that result when they relapse 3. Patient will identify two emotions related to recovery 4. Patient will demonstrate ability to communicate their needs through discussion and/or role plays   Summary of Patient Progress: x    Therapeutic Modalities:   Cognitive Behavioral Therapy Solution-Focused Therapy Assertiveness Training Relapse Prevention Therapy   Evalina Field, MSW, LCSW Clinical Social Work 04/26/2019 12:13 PM

## 2019-04-26 NOTE — Progress Notes (Signed)
Recreation Therapy Notes  Date: 04/26/2019  Time: 9:30 am   Location: Craft room   Behavioral response: N/A   Intervention Topic: Relaxation  Discussion/Intervention: Patient did not attend group.   Clinical Observations/Feedback:  Patient did not attend group.   Kaydan Wilhoite LRT/CTRS        Leslie Wong 04/26/2019 10:32 AM

## 2019-04-26 NOTE — BHH Suicide Risk Assessment (Signed)
[name] INPATIENT:  Family/Significant Other Suicide Prevention Education  Suicide Prevention Education:  Education Completed; Leslie Wong, daughter, [phone] has been identified by the patient as the family member/significant other with whom the patient will be residing, and identified as the person(s) who will aid the patient in the event of a mental health crisis (suicidal ideations/suicide attempt).  With written consent from the patient, the family member/significant other has been provided the following suicide prevention education, prior to the and/or following the discharge of the patient.  The suicide prevention education provided includes the following:  Suicide risk factors  Suicide prevention and interventions  National Suicide Hotline telephone number  [facility] assessment telephone number  [facility] Emergency Assistance [place] and/or Residential Mobile Crisis Unit telephone number  Request made of family/significant other to:  Remove weapons (e.g., guns, rifles, knives), all items previously/currently identified as safety concern.    Remove drugs/medications (over-the-counter, prescriptions, illicit drugs), all items previously/currently identified as a safety concern.  The family member/significant other verbalizes understanding of the suicide prevention education information provided.  The family member/significant other agrees to remove the items of safety concern listed above.  Daughter expressed concerns that mother has bizarre behaviors.  Daughter also expressed concerns that the patient "can turn it off and on" in terms of the odd behaviors.  Daughter reports that she doesn't think her mother should be discharged and CSW pointed out that at this time her mother is not.  Daughter reports that she thinks her mother should not live alone and should be placed in assisted living.  CSW pointed out that pt is her own guardian and family  would need to be appointed guardianship.  Daughter reports that she has no plans to take guardianship as she is "overwhelemed".  Daughter reports that she would like the patient to be considered for a transfer to [facility] as the pt did better at [facility] in the past than [facility] and the stay at [facility] was longer.   Leslie Wong 04/26/2019, 9:10 AM

## 2019-04-26 NOTE — Plan of Care (Signed)
D- Patient alert and oriented. Patient presents in a pleasant mood on assessment stating that she didn't sleep well last night because the pain woke her up, rating her pain "10/10", stating "this pain is worse than childbirth". Patient asked this writer if she had any pain medication, however, she did not request anything from this writer at this time. Patient endorsed depression and anxiety stating that "multiple things" and "I have anxiety bad, I guess I get worked up too much" is why she's feeling this way. Patient denies SI, HI, AVH, at this time. Patient had no stated goals for today.  A- Scheduled medications administered to patient, per MD orders. Support and encouragement provided.  Routine safety checks conducted every 15 minutes.  Patient informed to notify staff with problems or concerns.  R- No adverse drug reactions noted. Patient contracts for safety at this time. Patient compliant with medications and treatment plan. Patient receptive, calm, and cooperative. Patient interacts well with others on the unit.  Patient remains safe at this time.  Problem: Education: Goal: Knowledge of [place] [facility] Education information/materials will improve Outcome: Progressing   Problem: Coping: Goal: Coping ability will improve Outcome: Progressing Goal: Will verbalize feelings Outcome: Progressing   Problem: Safety: Goal: Ability to redirect hostility and anger into socially appropriate behaviors will improve Outcome: Progressing Goal: Ability to remain free from injury will improve Outcome: Progressing

## 2019-04-26 NOTE — Progress Notes (Signed)
Recreation Therapy Notes  INPATIENT RECREATION THERAPY ASSESSMENT  Patient Details Name: Leslie Wong MRN: [id number] DOB: [birth year] Today's Date: 04/26/2019       Information Obtained From: Patient  Able to Participate in Assessment/Interview: Yes  Patient Presentation: Responsive  Reason for Admission (Per Patient): Active Symptoms, Other (Comments)(My skin is busted and I am in pain)  Patient Stressors:    Coping Skills:   Other (Comment)(Smoke, Pet dog)  Leisure Interests (2+):  Social - Family  Frequency of Recreation/Participation: Monthly  Awareness of Community Resources:     Community Resources:     Current Use:    If no, Barriers?:    Expressed Interest in [place] of Residence:  Insurance underwriter  Patient Main Form of Transportation: Musician  Patient Strengths:  Kind to people and I help others  Patient Identified Areas of Improvement:  Get my health back  Patient Goal for Hospitalization:  Get better to go home and not come back  Current SI (including self-harm):  No  Current HI:  No  Current AVH: No  Staff Intervention Plan: Group Attendance, Collaborate with Interdisciplinary Treatment Team  Consent to Intern Participation: N/A  Badr Piedra 04/26/2019, 8:13 AM

## 2019-04-26 NOTE — Plan of Care (Signed)
Patient stated to this writer that she had a good day and that she was feeling better.   Problem: Coping: Goal: Will verbalize feelings Outcome: Progressing

## 2019-04-27 DIAGNOSIS — F11259 Opioid dependence with opioid-induced psychotic disorder, unspecified: Secondary | ICD-10-CM

## 2019-04-27 NOTE — Progress Notes (Signed)
Patient complaining of nausea and vomiting around this time. Dr. He notified and patient given PRN medication for comfort and relief. Will continue to monitor.

## 2019-04-27 NOTE — [facility] Group Notes (Signed)
LCSW Group Therapy Note  04/27/2019 1:15pm  Type of Therapy and Topic: Group Therapy: Holding on to Grudges   Participation Level: Active   Description of Group:  In this group patients will be asked to explore and define a grudge. Patients will be guided to discuss their thoughts, feelings, and reasons as to why people have grudges. Patients will process the impact grudges have on daily life and identify thoughts and feelings related to holding grudges. Facilitator will challenge patients to identify ways to let go of grudges and the benefits this provides. Patients will be confronted to address why one struggles letting go of grudges. Lastly, patients will identify feelings and thoughts related to what life would look like without grudges. This group will be process-oriented, with patients participating in exploration of their own experiences, giving and receiving support, and processing challenge from other group members.  Therapeutic Goals:  1. Patient will identify specific grudges related to their personal life.  2. Patient will identify feelings, thoughts, and beliefs around grudges.  3. Patient will identify how one releases grudges appropriately.  4. Patient will identify situations where they could have let go of the grudge, but instead chose to hold on.   Summary of Patient Progress: The patient reported that  she feels pretty good today." Patients were guided to discuss their thoughts, feelings, and reasons as to why people have grudges. The patient was able to process the impact grudges have on daily life and identified thoughts and feelings related to holding grudges. The patient was challenged to identify ways to let go of grudges and the benefits this provides. Pt. actively and appropriately engaged in the group. Patient was able to provide support and validation to other group members.   Therapeutic Modalities:  Cognitive Behavioral Therapy  Solution Focused Therapy   Motivational Interviewing  Brief Therapy   Shenell Rogalski  CUEBAS-COLON, LCSW 04/27/2019 12:30 PM

## 2019-04-27 NOTE — Progress Notes (Addendum)
[facility] MD Progress Note  04/27/2019 10:15 AM Florrie Ramires  MRN:  [id number] Subjective: Follow-up note for this 63 year old woman with bipolar disorder.    Patient seen chart reviewed.  She reports feeling "great" today and eating, sleeping well. She said that she hopes to go home to go to her service dog, which she misses a lot.  She was seen without any physical discomfort.  However, a few minutes later, she requested to have her oxycodone increased, which was firmly declined.  And I explained to her that narcotic pain medicines is not effective for chronic pain, and I would recommend to taper off.  She was somewhat upset and stated "well, I disagree, I have a pain clinic, you don't know how it is like to be in pain!' "well, then can't you give me some marijuana!"  She was explained repeatedly that long-term use of narcotic pain med is not beneficial and would not "solve" her pain problem.   RN: confirmed that she has requested to increase her pain meds several times and has frequent somatic complaints.   No Si or HI.  Principal Problem: Bipolar I disorder, most recent episode depressed ([place]) Diagnosis: Principal Problem:   Bipolar I disorder, most recent episode depressed ([place]) Active Problems:   Hypothyroidism   Chronic back pain   Bipolar 1 disorder, mixed, severe ([place])   Opiate dependence ([place])  Total Time spent with patient: 30 minutes  Past Psychiatric History: Patient has a history of bipolar disorder several prior admissions with psychosis.  The last time we had her here she was agitated and disorganized and psychotic and finally got better with clozapine.  This time she was disorganized and psychotic but seemed more depressed.  Had lost a lot of weight.  Complicating things is that the patient has chronic pain for which she is prescribed relatively high doses of chronic narcotic and also receives chronic benzodiazepine which she mixes with it.  Past Medical History:  Past  Medical History:  Diagnosis Date  . Back pain   . Cancer ([place])    skin  . Depression    BAD  . Gastroparesis   . GERD (gastroesophageal reflux disease)   . Hypothyroidism   . Insomnia   . Migraines   . Parkinson disease ([place])    per [place] [facility], dx'd 2019  . Recurrent cold sores   . Shoulder bursitis   . Urge incontinence     Past Surgical History:  Procedure Laterality Date  . ABDOMINAL HYSTERECTOMY  1999   total  . APPENDECTOMY    . ESOPHAGOGASTRODUODENOSCOPY  01/2006   negative except small hiatal hernia  . ESOPHAGOGASTRODUODENOSCOPY  02/24/2015   See report  . LAPAROSCOPY     for endometriosis  . OVARIAN CYST REMOVAL  1990   unilateral  . TONSILLECTOMY AND ADENOIDECTOMY     Family History:  Family History  Problem Relation Age of Onset  . Hypertension Mother   . Arthritis Mother   . Diabetes Mother   . Stroke Mother   . Cancer Father        lung  . Colon cancer Neg Hx   . Breast cancer Neg Hx    Family Psychiatric  History: None known as far as psychiatry Social History:  Social History   Substance and Sexual Activity  Alcohol Use No  . Alcohol/week: 0.0 standard drinks     Social History   Substance and Sexual Activity  Drug Use No    Social  History   Socioeconomic History  . Marital status: Divorced    Spouse name: Not on file  . Number of children: Not on file  . Years of education: Not on file  . Highest education level: Not on file  Occupational History  . Not on file  Social Needs  . Financial resource strain: Not on file  . Food insecurity:    Worry: Not on file    Inability: Not on file  . Transportation needs:    Medical: Not on file    Non-medical: Not on file  Tobacco Use  . Smoking status: Former Smoker    Packs/day: 1.00    Years: 42.50    Pack years: 42.50    Types: Cigarettes  . Smokeless tobacco: Former Network engineer and Sexual Activity  . Alcohol use: No    Alcohol/week: 0.0 standard drinks  . Drug use: No   . Sexual activity: Never  Lifestyle  . Physical activity:    Days per week: Not on file    Minutes per session: Not on file  . Stress: Not on file  Relationships  . Social connections:    Talks on phone: Not on file    Gets together: Not on file    Attends religious service: Not on file    Active member of club or organization: Not on file    Attends meetings of clubs or organizations: Not on file    Relationship status: Not on file  Other Topics Concern  . Not on file  Social History Narrative   Lives alone.     Has a Counsellor   Additional Social History:    Pain Medications: See PTA Prescriptions: See PTA Over the Counter: See PTA History of alcohol / drug use?: No history of alcohol / drug abuse Longest period of sobriety (when/how long): Report of no past or current use Negative Consequences of Use: (N/A) Withdrawal Symptoms: (N/A)  Sleep: Fair  Appetite:  Fair  Current Medications: Current Facility-Administered Medications  Medication Dose Route Frequency Provider Last Rate Last Dose  . acetaminophen (TYLENOL) tablet 650 mg  650 mg Oral Q6H PRN Lavella Hammock, MD      . albuterol (VENTOLIN HFA) 108 (90 Base) MCG/ACT inhaler 2 puff  2 puff Inhalation Q6H PRN Lavella Hammock, MD      . alum & mag hydroxide-simeth (MAALOX/MYLANTA) 200-200-20 MG/5ML suspension 30 mL  30 mL Oral Q4H PRN Lavella Hammock, MD      . cholecalciferol (VITAMIN D) tablet 1,000 Units  1,000 Units Oral Daily Lavella Hammock, MD   1,000 Units at 04/27/19 0755  . feeding supplement (ENSURE ENLIVE) (ENSURE ENLIVE) liquid 237 mL  237 mL Oral BID BM [name], [name], MD   237 mL at 04/27/19 0935  . hydrocerin (EUCERIN) cream   Topical BID [name], [name], MD   1 application at 40/98/11 [phone]  . hydrOXYzine (ATARAX/VISTARIL) tablet 50 mg  50 mg Oral TID PRN Lavella Hammock, MD   50 mg at 04/27/19 0755  . lamoTRIgine (LAMICTAL) tablet 50 mg  50 mg Oral Daily [name], [name], MD   50 mg at  04/27/19 0755  . levothyroxine (SYNTHROID) tablet 88 mcg  88 mcg Oral Daily Lavella Hammock, MD   88 mcg at 04/27/19 8295  . magnesium hydroxide (MILK OF MAGNESIA) suspension 30 mL  30 mL Oral Daily PRN Lavella Hammock, MD      .  multivitamin with minerals tablet 1 tablet  1 tablet Oral Daily Lavella Hammock, MD   1 tablet at 04/27/19 0755  . OLANZapine zydis (ZYPREXA) disintegrating tablet 10 mg  10 mg Oral Q8H PRN Lavella Hammock, MD       And  . ziprasidone (GEODON) injection 20 mg  20 mg Intramuscular PRN Lavella Hammock, MD      . OLANZapine zydis [facility]) disintegrating tablet 15 mg  15 mg Oral QHS [name], [name], MD   15 mg at 04/26/19 2112  . omega-3 acid ethyl esters (LOVAZA) capsule 1 g  1 g Oral Daily Lavella Hammock, MD   1 g at 04/27/19 0756  . ondansetron (ZOFRAN-ODT) disintegrating tablet 4 mg  4 mg Oral TID PRN Lavella Hammock, MD   4 mg at 04/27/19 0929  . oxyCODONE (Oxy IR/ROXICODONE) immediate release tablet 5 mg  5 mg Oral Q8H PRN Lavella Hammock, MD   5 mg at 04/27/19 0615  . pantoprazole (PROTONIX) EC tablet 40 mg  40 mg Oral Daily Lavella Hammock, MD   40 mg at 04/27/19 0755  . senna-docusate (Senokot-S) tablet 2 tablet  2 tablet Oral Daily Lavella Hammock, MD   2 tablet at 04/27/19 0756  . tiZANidine (ZANAFLEX) tablet 4 mg  4 mg Oral TID Lavella Hammock, MD   4 mg at 04/27/19 0756  . triamcinolone cream (KENALOG) 0.1 % 1 application  1 application Topical BID Lavella Hammock, MD   1 application at 78/58/85 [phone]  . valACYclovir (VALTREX) tablet 500 mg  500 mg Oral BID Lavella Hammock, MD   500 mg at 04/27/19 0755  . vitamin A capsule 10,000 Units  10,000 Units Oral Daily Lavella Hammock, MD   10,000 Units at 04/27/19 0820  . vitamin B-12 (CYANOCOBALAMIN) tablet 1,000 mcg  1,000 mcg Oral Daily Lavella Hammock, MD   1,000 mcg at 04/27/19 0755  . vitamin C (ASCORBIC ACID) tablet 500 mg  500 mg Oral Daily Lavella Hammock, MD   500 mg at 04/27/19 0755  . vitamin  E capsule 200 Units  200 Units Oral Daily Lavella Hammock, MD   200 Units at 04/27/19 4128    Lab Results:  No results found for this or any previous visit (from the past 48 hour(s)).  Blood Alcohol level:  Lab Results  Component Value Date   ETH <10 04/24/2019   ETH 45 (H) 78/67/6720    Metabolic Disorder Labs: Lab Results  Component Value Date   HGBA1C 5.4 10/13/2018   MPG 108.28 10/13/2018   MPG 111.15 08/29/2018   No results found for: PROLACTIN Lab Results  Component Value Date   CHOL 168 10/13/2018   TRIG 130 10/13/2018   HDL 39 (L) 10/13/2018   CHOLHDL 4.3 10/13/2018   VLDL 26 10/13/2018   LDLCALC 103 (H) 10/13/2018   LDLCALC 107 (H) 08/29/2018    Physical Findings: AIMS:  , ,  ,  ,    CIWA:    COWS:     Musculoskeletal: Strength & Muscle Tone: within normal limits Gait & Station: normal Patient leans: N/A  Psychiatric Specialty Exam: Physical Exam  Nursing note and vitals reviewed. Constitutional: She appears well-developed and well-nourished.  HENT:  Head: Normocephalic and atraumatic.  Eyes: Pupils are equal, round, and reactive to light. Conjunctivae are normal.  Neck: Normal range of motion.  Cardiovascular: Regular rhythm and normal heart sounds.  Respiratory: Effort  normal.  GI: Soft.  Musculoskeletal: Normal range of motion.  Neurological: She is alert.  Skin: Skin is warm and dry.  Psychiatric: Her mood appears anxious. Her speech is not tangential. She is not aggressive. Thought content is paranoid. Cognition and memory are impaired. She expresses impulsivity. She expresses no homicidal and no suicidal ideation. She exhibits abnormal recent memory.    Review of Systems  Constitutional: Negative.   HENT: Negative.   Eyes: Negative.   Respiratory: Negative.   Cardiovascular: Negative.   Gastrointestinal: Negative.   Musculoskeletal: Negative.   Skin: Negative.   Neurological: Negative.   Psychiatric/Behavioral: Positive for  depression and memory loss. Negative for hallucinations, substance abuse and suicidal ideas. The patient is nervous/anxious. The patient does not have insomnia.     Blood pressure (!) 140/94, pulse 96, temperature 98.6 F (37 C), temperature source Oral, resp. rate 16, height 4\' 11"  (1.499 m), weight 41.3 kg, SpO2 100 %.Body mass index is 18.38 kg/m.  General Appearance: Casual  Eye Contact:  Good  Speech:  Clear and Coherent  Volume:  Normal  Mood:  Anxious  Affect:  Constricted, Depressed and Labile  Thought Process:  Disorganized and Descriptions of Associations: Loose  Orientation:  Full (Time, Place, and Person)  Thought Content:  prettey med seeking  Suicidal Thoughts:  No  Homicidal Thoughts:  No  Memory:  Immediate;   Fair Recent;   Poor Remote;   Poor  Judgement:  Impaired  Insight:  Shallow  Psychomotor Activity:  Normal  Concentration:  Concentration: Fair  Recall:  [employer] of Knowledge:  Fair  Language:  Fair  Akathisia:  No  Handed:  Right  AIMS (if indicated):     Assets:  Desire for Improvement Financial Resources/Insurance Housing Social Support  ADL's:  Impaired  Cognition:  Impaired,  Mild  Sleep:  Number of Hours: 4     Treatment Plan Summary: Daily contact with patient to assess and evaluate symptoms and progress in treatment and Medication management   Plan: #Bipolar Affective disorder -- continue Lamictal 50mg  daily.  -- zyprexa 15mg  qhs.  -- continue zyprexa prn  -- Hx of positive response to Clozapine.  -- encourage her to continue to go to Groups  # Suspect Opioid use disorder;  Cannabis use disorder -- [place] control substance database indicated that this pt has been getting #180 tabs of oxycodone 5mg  each from Dr.  Leone Payor, who is known for prescribing significant amount of narcotic pain medicines in community.   -- Given pt's current drug seeking behaviors while there is no significantly discomfort by objective observation on the  unit, as well as her confusion on admission,  I do suspect the possibility of Opioid use disorder.   --  Continue oxycone 5mg  q8hr prn as determined by the primary team, however, I would sincerely consider to taper her off narcotic pain meds and coordinate with Dr. Sanjuan Dame with regards of the possibility of opioid addiction.  -- ABSOLUTELY AVOID ALL BENZO, especially given that she is still on oxycodone.  -- recommend substance abuse treatment in addition to mental health care.   # Disposition -- defer to primary team.     Aedyn Mckeon, MD 04/27/2019, 10:15 AM

## 2019-04-27 NOTE — Progress Notes (Signed)
D: Pt. During assessments denies si/hi/avh, able to contract for safety. Pt. Endorses an anxious mood, given PRN medication for this for comfort. Pt. Fixated on multiple somatic complaints that she wants addressed. Pt. Fixated on her apartment reportedly full of black mold. Pt. Coping expressed as not improving.    A: Q x 15 minute observation checks to be completed for safety. Patient was provided with education, but needs reinforcement. Patient given/offered medications per orders. Patient  was encourage to attend groups, participate in unit activities and continue with plan of care. Pt. Chart and plans of care reviewed. Pt. Given support and encouragement.   R: Patient is complaint with medications and unit procedures this morning. Pt. Observed eating good, drinks ensures and breakfast. Pt. Did however after breakfast complain of nausea and vomiting that was unwitnessed, but Dr. He was notified of this and patient was given PRN medications for relief and comfort. Pt. Endorses that her chronic pain is not being managed correctly and this causes anger for her. Pt. Expresses her pain is always, "10/10". Pt. Chronic pain being addressed utilizing MD orders. Pt. Instructed that EKG testing is needed per MD orders.

## 2019-04-27 NOTE — Progress Notes (Signed)
D: Patient has been anxious but pleasant. Asking for pain medication before it is due. Complaining of pain 10/10, generalized and chronic. Has been socializing with peers in the day room. Denies SI, HI and AV hallucinations A: Continue to monitor for safety. R: Safety maintained

## 2019-04-27 NOTE — Plan of Care (Signed)
  Problem: Safety: Goal: Ability to redirect hostility and anger into socially appropriate behaviors will improve Outcome: Progressing Goal: Ability to remain free from injury will improve Outcome: Progressing  D: Patient has been anxious but pleasant. Asking for pain medication before it is due. Complaining of pain 10/10, generalized and chronic. Has been socializing with peers in the day room. Denies SI, HI and AV hallucinations A: Continue to monitor for safety. R: Safety maintained

## 2019-04-27 NOTE — Plan of Care (Signed)
Pt. This morning not able to express improved coping. Pt. Is fixated on multiple somatic complaints. Pt. Also fixated on her apartment reporting that her apartment is full of black mold and is the reason she has lost so much weight. Pt. Expresses anxiety, requiring PRN medication. Pt. Expresses chronic pain and nausea/vomiting, given PRN medication for this as well when able to be given. Pt. Monitored for safety on the unit. Pt. Does deny si/hi/avh, able to contract for safety.   Problem: Coping: Goal: Coping ability will improve Outcome: Not Progressing   Problem: Safety: Goal: Ability to remain free from injury will improve Outcome: Progressing

## 2019-04-28 MED ORDER — TRAZODONE HCL 100 MG PO TABS: 100.0000 mg | ORAL_TABLET | Freq: Every evening | ORAL | Status: DC | PRN

## 2019-04-28 MED ORDER — TRAZODONE HCL 100 MG PO TABS: 100.0000 mg | ORAL_TABLET | Freq: Every day | ORAL | Status: DC

## 2019-04-28 NOTE — Plan of Care (Signed)
Pt. Continues to present with multiple somatic complaints and upon assessment of complaints are WDL. Pt. For exam complains of ulcers on her gums, but upon assessments, there is no evidence of ulcerated gums. Pt. This evening is intrusive, med-seeking, and overall dysphoric in her mood. Pt. Endorses coping has not improved.  Problem: Safety: Goal: Ability to remain free from injury will improve 04/28/2019 2121 by Reyes Ivan, RN Outcome: Progressing 04/28/2019 2120 by Reyes Ivan, RN Outcome: Progressing  Pt. Monitored for safety on the unit and able to remain safe on the unit.    Problem: Coping: Goal: Coping ability will improve Outcome: Not Progressing   Problem: Education: Goal: Will be free of psychotic symptoms Outcome: Not Progressing

## 2019-04-28 NOTE — Progress Notes (Signed)
Patient asked this writer if she could get a check out of her belongings to give to her sister in order to pay her rent for the month of May. This Probation officer and patient went into the search room with security and patient was allowed to obtain her checkbook and write out her check, as well as her debit card number, so that she can pay her other bills over the phone. This writer went up to meet patient's sister and gave her patient's rent check in an envelope.

## 2019-04-28 NOTE — Plan of Care (Signed)
D- Patient alert and oriented. Patient presents in a pleasant mood on assessment stating that "my feet and hands woke me up" last night "it felt they were in the pits of hell, I need something for my neuropathy". Patient reports that her depression and anxiety are "threw the roof", however, she did not go into detail as to why she is feeling this way. Patient denies SI, HI, AVH, at this time. Patient's goal for today is to "take my meds and go home".  A- Scheduled medications administered to patient, per MD orders. Support and encouragement provided.  Routine safety checks conducted every 15 minutes.  Patient informed to notify staff with problems or concerns.  R- No adverse drug reactions noted. Patient contracts for safety at this time. Patient compliant with medications and treatment plan. Patient receptive, calm, and cooperative. Patient interacts well with others on the unit.  Patient remains safe at this time.   Problem: Education: Goal: Knowledge of [facility] Education information/materials will improve Outcome: Progressing   Problem: Coping: Goal: Coping ability will improve Outcome: Progressing Goal: Will verbalize feelings Outcome: Progressing   Problem: Safety: Goal: Ability to redirect hostility and anger into socially appropriate behaviors will improve Outcome: Progressing Goal: Ability to remain free from injury will improve Outcome: Progressing

## 2019-04-28 NOTE — Progress Notes (Signed)
[facility] MD Progress Note  04/28/2019 10:10 AM Leslie Wong  MRN:  [id number] Subjective: Follow-up note for this 63 year old woman with bipolar disorder, and chronic pain on narcotic pain med.   Patient seen chart reviewed.  She was 180 degree different today, very dramatic and tearful about her severe pain, most foot pain, stated that she has neuropathy, and adamantly stated that she can't take gabapentin.  Yesterday was a great day, she was comfortable.   Now, she stated that she is in pain every minute of the day and it is near stopped.  But she could not tell me what happened from yesterday to day.  It was then pointed out to her that her way of thinking (attitude) affects how she feels about pain.  She was not ready to hear that today.   She argued so hard today to get her oxycodone back to q4hr as Dr. Leone Payor prescribed her outpatient.  I explained to her that I completely disagree with Dr. Jaci Lazier prescribing pattern, and know several people ended up in addiction clinic from that pain management clinic.  I explained to her the effectiveness of oxycodone significantly reduces over time, thus, it should be used ONLY for acute pain, not chronic pain.  Yet the side effects of oxycodone dramatically increased over time.  Pt stated "well, I am not addicted and I will never be addicted because my friend died from it".  It was then pointed out to her that she would not have control over the development of addiction.   She started crying and telling me how much pain and suffer she has been through.   She is reminded, as she described for herself, that there is no difference in her pain level today from yesterday, thus, her attitude or her coping skills when she uses it can help.  Furthermore, she does have oxyconde 5mg  q8hr prn now, and that I would not increase further.  She accused that Probation officer having a vendetta again pain meds etc and wants to call Dr. Sanjuan Dame tomorrow to report to him "how badly I  was treated here".  Again, pt was reassured that I have her best interest, and I don't want to do any harm to her body, and giving her more pain meds is harmful.    No Si or HI.   RN: continues to have somatic complaints.   Principal Problem: Bipolar I disorder, most recent episode depressed ([place]) Diagnosis: Principal Problem:   Bipolar I disorder, most recent episode depressed ([place]) Active Problems:   Hypothyroidism   Chronic back pain   Bipolar 1 disorder, mixed, severe ([place])   Opiate dependence ([place])  Total Time spent with patient: 30 minutes  Past Psychiatric History: Patient has a history of bipolar disorder several prior admissions with psychosis.  The last time we had her here she was agitated and disorganized and psychotic and finally got better with clozapine.  This time she was disorganized and psychotic but seemed more depressed.  Had lost a lot of weight.  Complicating things is that the patient has chronic pain for which she is prescribed relatively high doses of chronic narcotic and also receives chronic benzodiazepine which she mixes with it.  Past Medical History:  Past Medical History:  Diagnosis Date  . Back pain   . Cancer ([place])    skin  . Depression    BAD  . Gastroparesis   . GERD (gastroesophageal reflux disease)   . Hypothyroidism   . Insomnia   .  Migraines   . Parkinson disease ([place])    per [place] [facility], dx'd 2019  . Recurrent cold sores   . Shoulder bursitis   . Urge incontinence     Past Surgical History:  Procedure Laterality Date  . ABDOMINAL HYSTERECTOMY  1999   total  . APPENDECTOMY    . ESOPHAGOGASTRODUODENOSCOPY  01/2006   negative except small hiatal hernia  . ESOPHAGOGASTRODUODENOSCOPY  02/24/2015   See report  . LAPAROSCOPY     for endometriosis  . OVARIAN CYST REMOVAL  1990   unilateral  . TONSILLECTOMY AND ADENOIDECTOMY     Family History:  Family History  Problem Relation Age of Onset  . Hypertension Mother   . Arthritis  Mother   . Diabetes Mother   . Stroke Mother   . Cancer Father        lung  . Colon cancer Neg Hx   . Breast cancer Neg Hx    Family Psychiatric  History: None known as far as psychiatry Social History:  Social History   Substance and Sexual Activity  Alcohol Use No  . Alcohol/week: 0.0 standard drinks     Social History   Substance and Sexual Activity  Drug Use No    Social History   Socioeconomic History  . Marital status: Divorced    Spouse name: Not on file  . Number of children: Not on file  . Years of education: Not on file  . Highest education level: Not on file  Occupational History  . Not on file  Social Needs  . Financial resource strain: Not on file  . Food insecurity:    Worry: Not on file    Inability: Not on file  . Transportation needs:    Medical: Not on file    Non-medical: Not on file  Tobacco Use  . Smoking status: Former Smoker    Packs/day: 1.00    Years: 42.50    Pack years: 42.50    Types: Cigarettes  . Smokeless tobacco: Former Network engineer and Sexual Activity  . Alcohol use: No    Alcohol/week: 0.0 standard drinks  . Drug use: No  . Sexual activity: Never  Lifestyle  . Physical activity:    Days per week: Not on file    Minutes per session: Not on file  . Stress: Not on file  Relationships  . Social connections:    Talks on phone: Not on file    Gets together: Not on file    Attends religious service: Not on file    Active member of club or organization: Not on file    Attends meetings of clubs or organizations: Not on file    Relationship status: Not on file  Other Topics Concern  . Not on file  Social History Narrative   Lives alone.     Has a Counsellor   Additional Social History:    Pain Medications: See PTA Prescriptions: See PTA Over the Counter: See PTA History of alcohol / drug use?: No history of alcohol / drug abuse Longest period of sobriety (when/how long): Report of no past or current  use Negative Consequences of Use: (N/A) Withdrawal Symptoms: (N/A)  Sleep: Fair  Appetite:  Fair  Current Medications: Current Facility-Administered Medications  Medication Dose Route Frequency Provider Last Rate Last Dose  . acetaminophen (TYLENOL) tablet 650 mg  650 mg Oral Q6H PRN Lavella Hammock, MD   650 mg at 04/27/19 1115  . albuterol (  VENTOLIN HFA) 108 (90 Base) MCG/ACT inhaler 2 puff  2 puff Inhalation Q6H PRN Lavella Hammock, MD      . alum & mag hydroxide-simeth (MAALOX/MYLANTA) 200-200-20 MG/5ML suspension 30 mL  30 mL Oral Q4H PRN Lavella Hammock, MD      . cholecalciferol (VITAMIN D) tablet 1,000 Units  1,000 Units Oral Daily Lavella Hammock, MD   1,000 Units at 04/28/19 0830  . feeding supplement (ENSURE ENLIVE) (ENSURE ENLIVE) liquid 237 mL  237 mL Oral BID BM [name], [name], MD   237 mL at 04/27/19 1555  . hydrocerin (EUCERIN) cream   Topical BID [name], [name], MD      . hydrOXYzine (ATARAX/VISTARIL) tablet 50 mg  50 mg Oral TID PRN Lavella Hammock, MD   50 mg at 04/28/19 9147  . lamoTRIgine (LAMICTAL) tablet 50 mg  50 mg Oral Daily [name], [name], MD   50 mg at 04/28/19 0831  . levothyroxine (SYNTHROID) tablet 88 mcg  88 mcg Oral Daily Lavella Hammock, MD   88 mcg at 04/28/19 [phone]  . magnesium hydroxide (MILK OF MAGNESIA) suspension 30 mL  30 mL Oral Daily PRN Lavella Hammock, MD      . multivitamin with minerals tablet 1 tablet  1 tablet Oral Daily Lavella Hammock, MD   1 tablet at 04/28/19 0831  . OLANZapine zydis (ZYPREXA) disintegrating tablet 10 mg  10 mg Oral Q8H PRN Lavella Hammock, MD      . OLANZapine zydis [facility]) disintegrating tablet 15 mg  15 mg Oral QHS [name], [name], MD   15 mg at 04/27/19 2133  . omega-3 acid ethyl esters (LOVAZA) capsule 1 g  1 g Oral Daily Lavella Hammock, MD   1 g at 04/28/19 0831  . ondansetron (ZOFRAN-ODT) disintegrating tablet 4 mg  4 mg Oral TID PRN Lavella Hammock, MD   4 mg at 04/27/19 0929  . oxyCODONE (Oxy  IR/ROXICODONE) immediate release tablet 5 mg  5 mg Oral Q8H PRN Lavella Hammock, MD   5 mg at 04/28/19 0845  . pantoprazole (PROTONIX) EC tablet 40 mg  40 mg Oral Daily Lavella Hammock, MD   40 mg at 04/28/19 0831  . senna-docusate (Senokot-S) tablet 2 tablet  2 tablet Oral Daily Lavella Hammock, MD   2 tablet at 04/28/19 0830  . tiZANidine (ZANAFLEX) tablet 4 mg  4 mg Oral TID Lavella Hammock, MD   4 mg at 04/28/19 0830  . triamcinolone cream (KENALOG) 0.1 % 1 application  1 application Topical BID Lavella Hammock, MD   1 application at 62/13/08 0831  . valACYclovir (VALTREX) tablet 500 mg  500 mg Oral BID Lavella Hammock, MD   500 mg at 04/28/19 0830  . vitamin A capsule 10,000 Units  10,000 Units Oral Daily Lavella Hammock, MD   10,000 Units at 04/28/19 0830  . vitamin B-12 (CYANOCOBALAMIN) tablet 1,000 mcg  1,000 mcg Oral Daily Lavella Hammock, MD   1,000 mcg at 04/28/19 0831  . vitamin C (ASCORBIC ACID) tablet 500 mg  500 mg Oral Daily Lavella Hammock, MD   500 mg at 04/28/19 0830  . vitamin E capsule 200 Units  200 Units Oral Daily Lavella Hammock, MD   200 Units at 04/28/19 0831    Lab Results:  No results found for this or any previous visit (from the past 48 hour(s)).  Blood Alcohol level:  Lab Results  Component Value Date   ETH <10 04/24/2019   ETH 45 (H) 92/12/69    Metabolic Disorder Labs: Lab Results  Component Value Date   HGBA1C 5.4 10/13/2018   MPG 108.28 10/13/2018   MPG 111.15 08/29/2018   No results found for: PROLACTIN Lab Results  Component Value Date   CHOL 168 10/13/2018   TRIG 130 10/13/2018   HDL 39 (L) 10/13/2018   CHOLHDL 4.3 10/13/2018   VLDL 26 10/13/2018   LDLCALC 103 (H) 10/13/2018   LDLCALC 107 (H) 08/29/2018    Physical Findings: AIMS:  , ,  ,  ,    CIWA:    COWS:     Musculoskeletal: Strength & Muscle Tone: within normal limits Gait & Station: normal Patient leans: N/A  Psychiatric Specialty Exam: Physical Exam   Nursing note and vitals reviewed. Constitutional: She appears well-developed and well-nourished.  HENT:  Head: Normocephalic and atraumatic.  Eyes: Pupils are equal, round, and reactive to light. Conjunctivae are normal.  Neck: Normal range of motion.  Cardiovascular: Regular rhythm and normal heart sounds.  Respiratory: Effort normal.  GI: Soft.  Musculoskeletal: Normal range of motion.  Neurological: She is alert.  Skin: Skin is warm and dry.  Psychiatric: Her mood appears anxious. Her speech is not tangential. She is not aggressive. Thought content is paranoid. Cognition and memory are impaired. She expresses impulsivity. She expresses no homicidal and no suicidal ideation. She exhibits abnormal recent memory.    Review of Systems  Constitutional: Negative.   HENT: Negative.   Eyes: Negative.   Respiratory: Negative.   Cardiovascular: Negative.   Gastrointestinal: Negative.   Musculoskeletal: Negative.   Skin: Negative.   Neurological: Negative.   Psychiatric/Behavioral: Positive for depression and memory loss. Negative for hallucinations, substance abuse and suicidal ideas. The patient is nervous/anxious. The patient does not have insomnia.     Blood pressure 139/89, pulse (!) 105, temperature 97.8 F (36.6 C), temperature source Oral, resp. rate 18, height 4\' 11"  (1.499 m), weight 41.3 kg, SpO2 100 %.Body mass index is 18.38 kg/m.  General Appearance: Casual  Eye Contact:  Good  Speech:  Clear and Coherent  Volume:  Normal  Mood:  Anxious  Affect:  Constricted, Depressed and Labile  Thought Process:  Disorganized and Descriptions of Associations: Loose  Orientation:  Full (Time, Place, and Person)  Thought Content:  prettey med seeking  Suicidal Thoughts:  No  Homicidal Thoughts:  No  Memory:  Immediate;   Fair Recent;   Poor Remote;   Poor  Judgement:  Impaired  Insight:  Shallow  Psychomotor Activity:  Normal  Concentration:  Concentration: Fair  Recall:  [employer] of Knowledge:  Fair  Language:  Fair  Akathisia:  No  Handed:  Right  AIMS (if indicated):     Assets:  Desire for Improvement Financial Resources/Insurance Housing Social Support  ADL's:  Impaired  Cognition:  Impaired,  Mild  Sleep:  Number of Hours: 6     Treatment Plan Summary: Daily contact with patient to assess and evaluate symptoms and progress in treatment and Medication management   Plan: #Bipolar Affective disorder -- continue Lamictal 50mg  daily.  -- zyprexa 15mg  qhs.  -- continue zyprexa prn  -- Hx of positive response to Clozapine.  -- encourage her to continue to go to Groups  # Suspect Opioid use disorder;  Cannabis use disorder -- [place] control substance database indicated that this pt has been getting #180 tabs  of oxycodone 5mg  each from Dr.  Leone Payor, who is known for prescribing significant amount of narcotic pain medicines in community.   -- Given pt's current extreme drug seeking behaviors while there is no significantly discomfort by objective observation on the unit, as well as her confusion on admission,  I do suspect the possibility of Opioid use disorder.   --  Continue oxycone 5mg  q8hr prn as determined by the primary team, however, I would sincerely consider to taper her off narcotic pain meds and coordinate with Dr. Sanjuan Dame with regards of the possibility of opioid addiction.  -- ABSOLUTELY AVOID ALL BENZO, especially given that she is still on oxycodone.  -- recommend substance abuse treatment in addition to mental health care.   # Disposition -- defer to primary team.     Unity Luepke, MD 04/28/2019, 10:10 AM

## 2019-04-28 NOTE — Progress Notes (Signed)
D: Pt. During assessments denies si/hi/avh, able to contract for safety. Pt. Describes a dyphoric mood and is upset with the weekend provider she has been seeing she states. Pt. Feels she is not being given the medications she needs.    A: Q x 15 minute observation checks were completed for safety. Patient was provided with education, but is non-accepting of information provided. Patient was given/offered medications per orders. Patient  was encourage to attend groups, participate in unit activities and continue with plan of care. Pt. Chart and plans of care reviewed. Pt. Given support and encouragement.   R: Patient is mostly complaint with medications and unit procedures this evening with direction and encouragement. Pt. Frequently observed around the milieu socializing with peers. Pt. Frequently up at the nursing station intrusive with somatic complaints. Upon assessment of somatic complaints they are WDL objectively. Pt. For example complains of ulcers on her gums, but upon assessments, there is no evidence of ulcerated gums. Pt. Requested sleep-aid, so sleep aid was ordered by the provider per verbal PRN order, but after discovery of medicine not being ativan, she declined stating, "ativan really works for me and that's what I've taken before, trazodone isn't going to work, it gives me terrible nightmares". Pt. Before this writer spoke with the provider about sleep-aids was agreeable to trazodone she verbalized, but changed her mind as described above. Pt. covid screened and chronic pain is being addressed utilizing MD orders.                Precautionary checks every 15 minutes for safety maintained, room free of safety hazards, patient sustains no injury or falls during this shift. Will endorse care to next shift.

## 2019-04-28 NOTE — [facility] Group Notes (Signed)
LCSW Group Therapy Note 04/28/2019 1:15pm  Type of Therapy and Topic: Group Therapy: Feelings Around Returning Home & Establishing a Supportive Framework and Supporting Oneself When Supports Not Available  Participation Level: Did Not Attend  Description of Group:  Patients first processed thoughts and feelings about upcoming discharge. These included fears of upcoming changes, lack of change, new living environments, judgements and expectations from others and overall stigma of mental health issues. The group then discussed the definition of a supportive framework, what that looks and feels like, and how do to discern it from an unhealthy non-supportive network. The group identified different types of supports as well as what to do when your family/friends are less than helpful or unavailable  Therapeutic Goals  1. Patient will identify one healthy supportive network that they can use at discharge. 2. Patient will identify one factor of a supportive framework and how to tell it from an unhealthy network. 3. Patient able to identify one coping skill to use when they do not have positive supports from others. 4. Patient will demonstrate ability to communicate their needs through discussion and/or role plays.  Summary of Patient Progress:  Pt was invited to attend group but chose not to attend. CSW will continue to encourage pt to attend group throughout their admission.   Therapeutic Modalities Cognitive Behavioral Therapy Motivational Interviewing   Ramell Wacha  CUEBAS-COLON, LCSW 04/28/2019 11:05 AM

## 2019-04-28 NOTE — Plan of Care (Signed)
Cooperative with treatment on shift, she interacted well with peers and staff, she denies having any mental health issues although. She took medication on shift and was medication compliant.  She appears to be in bed resting at this time.

## 2019-04-29 MED ORDER — TRIAMCINOLONE ACETONIDE 0.1 % EX CREA: 1.0000 "application " | TOPICAL_CREAM | Freq: Two times a day (BID) | CUTANEOUS | 0 refills | Status: DC

## 2019-04-29 MED ORDER — HYDROXYZINE HCL 50 MG PO TABS: 50.0000 mg | ORAL_TABLET | Freq: Three times a day (TID) | ORAL | 0 refills | Status: DC | PRN

## 2019-04-29 MED ORDER — TRAZODONE HCL 100 MG PO TABS: 100.0000 mg | ORAL_TABLET | Freq: Every evening | ORAL | 0 refills | Status: DC | PRN

## 2019-04-29 MED ORDER — OLANZAPINE 15 MG PO TBDP: 15.0000 mg | ORAL_TABLET | Freq: Every day | ORAL | 0 refills | Status: DC

## 2019-04-29 MED ORDER — HYDROCERIN EX CREA: 1.0000 "application " | TOPICAL_CREAM | Freq: Two times a day (BID) | CUTANEOUS | 0 refills | Status: DC

## 2019-04-29 NOTE — Progress Notes (Signed)
Patient ID: Leslie Wong, female   DOB: Jan 02, 1956, 63 y.o.   MRN: [id number]   Discharge Note:  Patient denies SI/HI/AVH at this time. Discharge instructions, AVS, prescriptions, and transition record gone over with patient. Patient agrees to comply with medication management, follow-up visit, and outpatient therapy. Patient belongings returned to patient. Patient questions and concerns addressed and answered. Patient ambulatory off unit. Patient discharged to home with sister.

## 2019-04-29 NOTE — Progress Notes (Signed)
  [facility] Adult Case Management Discharge Plan :  Will you be returning to the same living situation after discharge:  Yes,  pt is returning home.  At discharge, do you have transportation home?: Yes,  pt's sister is providing transportation.  Do you have the ability to pay for your medications: Yes,  Medicare  Release of information consent forms completed and in the chart;  Patient's signature needed at discharge.  Patient to Follow up at: Follow-up Information    [facility] [place] on 05/01/2019.   Why:  Please attend your appointment at 2:40PM with Dr. Hassan Buckler.  Dr. Starleen Blue no longer works there.  Contact information: [place] [address] [phone]           Next level of care provider has access to [facility] and Suicide Prevention discussed: Yes,  SPE completed with daughter.      Has patient been referred to the Quitline?: Patient refused referral  Patient has been referred for addiction treatment: Pt. refused referral  Rozann Lesches, LCSW 04/29/2019, 11:41 AM

## 2019-04-29 NOTE — Discharge Instructions (Signed)
Bipolar 1 Disorder Bipolar 1 disorder is a mental health disorder in which a person has episodes of emotional highs (mania), and may also have episodes of emotional lows (depression) in addition to highs. Bipolar 1 disorder is different from other bipolar disorders because it involves extreme manic episodes. These episodes last at least one week or involve symptoms that are so severe that hospitalization is needed to keep the person safe. What increases the risk? The cause of this condition is not known. However, certain factors make you more likely to have bipolar disorder, such as:  Having a family member with the disorder.  An imbalance of certain chemicals in the brain (neurotransmitters).  Stress, such as illness, financial problems, or a death.  Certain conditions that affect the brain or spinal cord (neurologic conditions).  Brain injury (trauma).  Having another mental health disorder, such as: ? Obsessive compulsive disorder. ? Schizophrenia. What are the signs or symptoms? Symptoms of mania include:  Very high self-esteem or self-confidence.  Decreased need for sleep.  Unusual talkativeness or feeling a need to keep talking. Speech may be very fast. It may seem like you cannot stop talking.  Racing thoughts or constant talking, with quick shifts between topics that may or may not be related (flight of ideas).  Decreased ability to focus or concentrate.  Increased purposeful activity, such as work, studies, or social activity.  Increased nonproductive activity. This could be pacing, squirming and fidgeting, or finger and toe tapping.  Impulsive behavior and poor judgment. This may result in high-risk activities, such as having unprotected sex or spending a lot of money. Symptoms of depression include:  Feeling sad, hopeless, or helpless.  Frequent or uncontrollable crying.  Lack of feeling or caring about anything.  Sleeping too much.  Moving more slowly than  usual.  Not being able to enjoy things you used to enjoy.  Wanting to be alone all the time.  Feeling guilty or worthless.  Lack of energy or motivation.  Trouble concentrating or remembering.  Trouble making decisions.  Increased appetite.  Thoughts of death, or the desire to harm yourself. Sometimes, you may have a mixed mood. This means having symptoms of depression and mania. Stress can make symptoms worse. How is this diagnosed? To diagnose bipolar disorder, your health care provider may ask about your:  Emotional episodes.  Medical history.  Alcohol and drug use. This includes prescription medicines. Certain medical conditions and substances can cause symptoms that seem like bipolar disorder (secondary bipolar disorder). How is this treated? Bipolar disorder is a long-term (chronic) illness. It is best controlled with ongoing (continuous) treatment rather than treatment only when symptoms occur. Treatment may include:  Medicine. Medicine can be prescribed by a provider who specializes in treating mental disorders (psychiatrist). ? Medicines called mood stabilizers are usually prescribed. ? If symptoms occur even while taking a mood stabilizer, other medicines may be added.  Psychotherapy. Some forms of talk therapy, such as cognitive-behavioral therapy (CBT), can provide support, education, and guidance.  Coping methods, such as journaling or relaxation exercises. These may include: ? Yoga. ? Meditation. ? Deep breathing.  Lifestyle changes, such as: ? Limiting alcohol and drug use. ? Exercising regularly. ? Getting plenty of sleep. ? Making healthy eating choices.  A combination of medicine, talk therapy, and coping methods is best. A procedure in which electricity is applied to the brain through the scalp (electroconvulsive therapy) may be used in cases of severe mania when medicine and psychotherapy work too  slowly or do not work. Follow these instructions at  home: Activity   Return to your normal activities as told by your health care provider.  Find activities that you enjoy, and make time to do them.  Exercise regularly as told by your health care provider. Lifestyle  Limit alcohol intake to no more than 1 drink a day for nonpregnant women and 2 drinks a day for men. One drink equals 12 oz of beer, 5 oz of wine, or 1 oz of hard liquor.  Follow a set schedule for eating and sleeping.  Eat a balanced diet that includes fresh fruits and vegetables, whole grains, low-fat dairy, and lean meat.  Get 7-8 hours of sleep each night. General instructions  Take over-the-counter and prescription medicines only as told by your health care provider.  Think about joining a support group. Your health care provider may be able to recommend a support group.  Talk with your family and loved ones about your treatment goals and how they can help.  Keep all follow-up visits as told by your health care provider. This is important. Where to find more information For more information about bipolar disorder, visit the following websites:  [employer] on Mental Illness: www.nami.[place]: https://carter.com/ Contact a health care provider if:  Your symptoms get worse.  You have side effects from your medicine, and they get worse.  You have trouble sleeping.  You have trouble doing daily activities.  You feel unsafe in your surroundings.  You are dealing with substance abuse. Get help right away if:  You have new symptoms.  You have thoughts about harming yourself.  You self-harm. This information is not intended to replace advice given to you by your health care provider. Make sure you discuss any questions you have with your health care provider. Document Released: 03/20/2001 Document Revised: 08/07/2016 Document Reviewed: 08/11/2016 Elsevier Interactive Patient Education  [employer].

## 2019-04-29 NOTE — Discharge Summary (Signed)
Physician Discharge Summary Note  Patient:  Leslie Wong is an 63 y.o., female MRN:  [id number] DOB:  Sep 09, 1956 Patient phone:  [phone] (home)  Patient address:   [place] [address],  Total Time spent with patient: 30 minutes  Date of Admission:  04/24/2019 Date of Discharge: 04/29/19  Reason for Admission:  Suicide threat  Principal Problem: Bipolar I disorder, most recent episode depressed [facility]) Discharge Diagnoses: Principal Problem:   Bipolar I disorder, most recent episode depressed ([place]) Active Problems:   Opiate dependence ([place])   Hypothyroidism   Chronic back pain   Past Psychiatric History: bipolar disorder, opiate dependence  Past Medical History:  Past Medical History:  Diagnosis Date  . Back pain   . Cancer ([place])    skin  . Depression    BAD  . Gastroparesis   . GERD (gastroesophageal reflux disease)   . Hypothyroidism   . Insomnia   . Migraines   . Parkinson disease ([place])    per [facility], dx'd 2019  . Recurrent cold sores   . Shoulder bursitis   . Urge incontinence     Past Surgical History:  Procedure Laterality Date  . ABDOMINAL HYSTERECTOMY  1999   total  . APPENDECTOMY    . ESOPHAGOGASTRODUODENOSCOPY  01/2006   negative except small hiatal hernia  . ESOPHAGOGASTRODUODENOSCOPY  02/24/2015   See report  . LAPAROSCOPY     for endometriosis  . OVARIAN CYST REMOVAL  1990   unilateral  . TONSILLECTOMY AND ADENOIDECTOMY     Family History:  Family History  Problem Relation Age of Onset  . Hypertension Mother   . Arthritis Mother   . Diabetes Mother   . Stroke Mother   . Cancer Father        lung  . Colon cancer Neg Hx   . Breast cancer Neg Hx    Family Psychiatric  History: none Social History:  Social History   Substance and Sexual Activity  Alcohol Use No  . Alcohol/week: 0.0 standard drinks     Social History   Substance and Sexual Activity  Drug Use No    Social History    Socioeconomic History  . Marital status: Divorced    Spouse name: Not on file  . Number of children: Not on file  . Years of education: Not on file  . Highest education level: Not on file  Occupational History  . Not on file  Social Needs  . Financial resource strain: Not on file  . Food insecurity:    Worry: Not on file    Inability: Not on file  . Transportation needs:    Medical: Not on file    Non-medical: Not on file  Tobacco Use  . Smoking status: Former Smoker    Packs/day: 1.00    Years: 42.50    Pack years: 42.50    Types: Cigarettes  . Smokeless tobacco: Former Network engineer and Sexual Activity  . Alcohol use: No    Alcohol/week: 0.0 standard drinks  . Drug use: No  . Sexual activity: Never  Lifestyle  . Physical activity:    Days per week: Not on file    Minutes per session: Not on file  . Stress: Not on file  Relationships  . Social connections:    Talks on phone: Not on file    Gets together: Not on file    Attends religious service: Not on file  Active member of club or organization: Not on file    Attends meetings of clubs or organizations: Not on file    Relationship status: Not on file  Other Topics Concern  . Not on file  Social History Narrative   Lives alone.     Has a dog names [facility] Course:  On admission 04/25/19:  Patient seen and chart reviewed.  Patient familiar from prior hospitalizations.  This is a woman with a history of bipolar disorder who was brought here after she called her primary care doctor's office in such a state of confusion and agitation that they called someone to check on her safety.  Patient is an unreliable historian.  Her memory is poor her estimation of her own health and her assessment of what is going on with her are all impaired by her illness.  Patient has lost a very significant amount of weight.  Looks almost cachectic.  Apparently they checked with her family in the emergency room and family has  been trying to push food on her and bring her groceries but feels the patient has stopped eating.  Patient is very focused on complaints about "black mold" which she says is in her apartment.  Her speech is rambling tangential and disorganized filled with complaints about how her health is being affected by the black mold.  She claims that she has been eating well but admits she has "a small appetite".  She admits that she is made comments at times about wishing that she would die rather than live in such misery but absolutely denies any suicidal intent or plan.  No evidence I think that she was intentionally trying to kill her self.  Denies homicidal ideation.  Denies hallucinations but so many of her stories are disorganized and hard to follow it is difficult to be sure just how out of touch with reality she is.  It sounds like she is probably not been compliant with psychiatric medicine recently.  She claims that she has been seeing her outpatient psychiatric provider at [facility] right up until last month but I am suspicious that may not be true.  I have left a message at [facility] asking for a call back.  Patient claims the only psychiatric medicine she was on was Wellbutrin and Ativan and Lamictal, none of which would have kept her out of the kind of psychotic bipolar that she had last year.  I also confirmed that she is continuing to get oxycodone prescribed by the pain clinic in [place].  Medications:  Lamictal 25 mg daily, Zyprexa 15 mg at bedtime, hydroxyzine 50 mg TID PRN anxiety  04/26/19: Follow-up note for this 63 year old woman with bipolar disorder.  Patient seen chart reviewed.  Spoke with the patient's daughter as well.  Patient says she is feeling slightly better today although she is still very nervous and feels down and negative and overly worried.  She slept better last night with her medicine and has eaten her meals today.  Her thoughts seem to be more organized although she still  has a hard time explaining completely what is going on with her.  There is no evidence that she is having any negative side effects.  I do not see any tremulousness or stiffness in particular with her other than the stiffness from her arthritis in her neck.  The daughter believes all of her mother's claims which I think are probably mostly untrue egg for example that she  has Parkinson's disease and that antipsychotic medications make her worse.  Daughter however clearly has her mother's best interest at heart.  Medications:  Did not restart her Ativan or any benzodiazepines, limited her Percocet medication to every 8 hours  04/27/19: Patient seen chart reviewed.  She reports feeling "great" today and eating, sleeping well. She said that she hopes to go home to go to her service dog, which she misses a lot.  She was seen without any physical discomfort.  However, a few minutes later, she requested to have her oxycodone increased, which was firmly declined.  And I explained to her that narcotic pain medicines is not effective for chronic pain, and I would recommend to taper off.  She was somewhat upset and stated "well, I disagree, I have a pain clinic, you don't know how it is like to be in pain!' "well, then can't you give me some marijuana!"  She was explained repeatedly that long-term use of narcotic pain med is not beneficial and would not "solve" her pain problem.  Medications:  Increased her Lamictal to 50 mg daily  04/28/19:  Patient seen chart reviewed.  She was 180 degree different today, very dramatic and tearful about her severe pain, most foot pain, stated that she has neuropathy, and adamantly stated that she can't take gabapentin.  Yesterday was a great day, she was comfortable.   Now, she stated that she is in pain every minute of the day and it is near stopped.  But she could not tell me what happened from yesterday to day.  It was then pointed out to her that her way of thinking (attitude)  affects how she feels about pain.  She was not ready to hear that today.   She argued so hard today to get her oxycodone back to q4hr as Dr. Leone Payor prescribed her outpatient.  I explained to her that I completely disagree with Dr. Jaci Lazier prescribing pattern, and know several people ended up in addiction clinic from that pain management clinic.  I explained to her the effectiveness of oxycodone significantly reduces over time, thus, it should be used ONLY for acute pain, not chronic pain.  Yet the side effects of oxycodone dramatically increased over time.  Pt stated "well, I am not addicted and I will never be addicted because my friend died from it".  It was then pointed out to her that she would not have control over the development of addiction.   She started crying and telling me how much pain and suffer she has been through.   She is reminded, as she described for herself, that there is no difference in her pain level today from yesterday, thus, her attitude or her coping skills when she uses it can help.  Furthermore, she does have oxyconde 19m q8hr prn now, and that I would not increase further.  She accused that wProbation officerhaving a vendetta again pain meds etc and wants to call Dr. DSanjuan Dametomorrow to report to him "how badly I was treated here".  Again, pt was reassured that I have her best interest, and I don't want to do any harm to her body, and giving her more pain meds is harmful.    Medications:  No changes  04/29/19:  Patient has met maximum benefits of hospitalization.  Denies suicidal/homicidal ideations, hallucinations, and substance abuse.  Discharge instructions provided with crisis numbers, Rx, and a follow up appointment.  Musculoskeletal: Strength & Muscle Tone: within normal limits Gait &  Station: normal Patient leans: N/A  Psychiatric Specialty Exam: Physical Exam  Nursing note and vitals reviewed. Constitutional: She is oriented to person, place, and time. She appears  well-developed and well-nourished.  HENT:  Head: Normocephalic.  Neck: Normal range of motion.  Respiratory: Effort normal.  Musculoskeletal: Normal range of motion.  Neurological: She is alert and oriented to person, place, and time.  Psychiatric: Her speech is normal. Judgment and thought content normal. Her mood appears anxious. Cognition and memory are normal. She exhibits a depressed mood.    Review of Systems  Psychiatric/Behavioral: Positive for depression. The patient is nervous/anxious.   All other systems reviewed and are negative.   Blood pressure 139/84, pulse 98, temperature 97.8 F (36.6 C), temperature source Oral, resp. rate 18, height '4\' 11"'  (1.499 m), weight 41.3 kg, SpO2 100 %.Body mass index is 18.38 kg/m.  General Appearance: Casual  Eye Contact:  Good  Speech:  Normal Rate  Volume:  Normal  Mood:  Anxious and Depressed, mild  Affect:  Congruent  Thought Process:  Coherent and Descriptions of Associations: Intact  Orientation:  Full (Time, Place, and Person)  Thought Content:  WDL and Logical  Suicidal Thoughts:  No  Homicidal Thoughts:  No  Memory:  Immediate;   Good Recent;   Good Remote;   Good  Judgement:  Fair  Insight:  Good  Psychomotor Activity:  Normal  Concentration:  Concentration: Good and Attention Span: Good  Recall:  Good  Fund of Knowledge:  Good  Language:  Good  Akathisia:  No  Handed:  Right  AIMS (if indicated):     Assets:  Housing Leisure Time Physical Health Resilience Social Support  ADL's:  Intact  Cognition:  WNL  Sleep:  Number of Hours: 6.5      Has this patient used any form of tobacco in the last 30 days? (Cigarettes, Smokeless Tobacco, Cigars, and/or Pipes) NA  Blood Alcohol level:  Lab Results  Component Value Date   ETH <10 04/24/2019   ETH 45 (H) 13/07/6577    Metabolic Disorder Labs:  Lab Results  Component Value Date   HGBA1C 5.4 10/13/2018   MPG 108.28 10/13/2018   MPG 111.15 08/29/2018   No  results found for: PROLACTIN Lab Results  Component Value Date   CHOL 168 10/13/2018   TRIG 130 10/13/2018   HDL 39 (L) 10/13/2018   CHOLHDL 4.3 10/13/2018   VLDL 26 10/13/2018   LDLCALC 103 (H) 10/13/2018   LDLCALC 107 (H) 08/29/2018    See Psychiatric Specialty Exam and Suicide Risk Assessment completed by Attending Physician prior to discharge.  Discharge destination:  Home  Is patient on multiple antipsychotic therapies at discharge:  No   Has Patient had three or more failed trials of antipsychotic monotherapy by history:  No  Recommended Plan for Multiple Antipsychotic Therapies: NA  Discharge Instructions    Diet - low sodium heart healthy   Complete by:  As directed    Discharge instructions   Complete by:  As directed    Follow up with outpatient provider, [facility] on 5/6   Increase activity slowly   Complete by:  As directed      Allergies as of 04/29/2019      Reactions   Cymbalta [duloxetine Hcl]    Valproic Acid Shortness Of Breath   Aspirin    REACTION: UNSPECIFIED   Atorvastatin Other (See Comments)   Tongue swelling, legs cramping   Baclofen    REACTION: Throat  swells up   Codeine    REACTION: UNSPECIFIED   Duloxetine Other (See Comments)   Vision loss, weight loss   Erythromycin    REACTION: UNSPECIFIED   Gabapentin    REACTION: throat swells up   Metoclopramide Hcl    REACTION: states "messed me up"   Nsaids Other (See Comments)   Stomach problems   Nucynta Er [tapentadol Hcl Er]    Intolerant, see note from 07/24/12.     Nucynta [tapentadol]    AMS   Prednisone    GI intolance   Pregabalin    Seroquel [quetiapine Fumerate] Other (See Comments)   Loss control, felt like on another planet   Sulfa Antibiotics Nausea And Vomiting   Topamax Other (See Comments)   Blisters in mouth and tongue   Vicodin [hydrocodone-acetaminophen]    Nausea, thrush and constipation      Medication List    STOP taking these medications   buPROPion 150 MG 24  hr tablet Commonly known as:  WELLBUTRIN XL   LORazepam 0.5 MG tablet Commonly known as:  ATIVAN   magic mouthwash Soln   OVER THE COUNTER MEDICATION     TAKE these medications     Indication  albuterol 108 (90 Base) MCG/ACT inhaler Commonly known as:  VENTOLIN HFA Inhale 2 puffs into the lungs every 6 (six) hours as needed for wheezing or shortness of breath.  Indication:  Chronic Obstructive Lung Disease   ascorbic acid 500 MG tablet Commonly known as:  VITAMIN C Take 500 mg by mouth daily.  Indication:  Inadequate Vitamin C   atorvastatin 20 MG tablet Commonly known as:  LIPITOR Take 1 tablet (20 mg total) by mouth daily.  Indication:  High Amount of Fats in the Blood   cholecalciferol 25 MCG (1000 UT) tablet Commonly known as:  VITAMIN D Take 1 tablet (1,000 Units total) by mouth daily.  Indication:  osteopenia   Fish Oil 1000 MG Caps Take 1 capsule (1,000 mg total) by mouth daily.  Indication:  bipolar disorder   Hair Skin & Nails Gummies 1250-7.5-7.5 MCG-MG-UNT Chew Generic drug:  Biotin w/ Vitamins C & E Chew by mouth daily.  Indication:  general health   hydrocerin Crea Apply 1 application topically 2 (two) times daily.  Indication:  dry skin   hydrOXYzine 50 MG tablet Commonly known as:  ATARAX/VISTARIL Take 1 tablet (50 mg total) by mouth 3 (three) times daily as needed for itching or anxiety.  Indication:  Feeling Anxious   lamoTRIgine 25 MG tablet Commonly known as:  LAMICTAL Take 2 tablets (50 mg total) by mouth daily.  Indication:  Mood   levothyroxine 88 MCG tablet Commonly known as:  SYNTHROID Take 1 tablet (88 mcg total) by mouth daily.  Indication:  Underactive Thyroid   multivitamin tablet Take 1 tablet by mouth daily.  Indication:  general health   olanzapine zydis 15 MG disintegrating tablet Commonly known as:  ZYPREXA Take 1 tablet (15 mg total) by mouth at bedtime.  Indication:  Manic Phase of Manic-Depression   omeprazole  20 MG capsule Commonly known as:  PRILOSEC TAKE 1 CAPSULE BY MOUTH EVERY MORNING 45 MINUTES BEFORE BREAKFAST What changed:  See the new instructions.  Indication:  Gastroesophageal Reflux Disease   ondansetron 4 MG disintegrating tablet Commonly known as:  ZOFRAN-ODT DISSOLVE 1 TABLET(4 MG) ON THE TONGUE THREE TIMES DAILY AS NEEDED FOR NAUSEA OR VOMITING What changed:  See the new instructions.  Indication:  Nausea and  Vomiting   oxyCODONE 5 MG immediate release tablet Commonly known as:  Roxicodone Take 1 tablet (5 mg total) by mouth every 8 (eight) hours as needed. What changed:  when to take this  Indication:  Chronic Pain   senna-docusate 8.6-50 MG tablet Commonly known as:  Senokot-S Take 2 tablets by mouth daily.  Indication:  Constipation   tiZANidine 4 MG tablet Commonly known as:  ZANAFLEX Take 1 tablet (4 mg total) by mouth 3 (three) times daily.  Indication:  Muscle Spasticity   traZODone 100 MG tablet Commonly known as:  DESYREL Take 1 tablet (100 mg total) by mouth at bedtime as needed for sleep.  Indication:  Trouble Sleeping   triamcinolone cream 0.1 % Commonly known as:  KENALOG Apply 1 application topically 2 (two) times daily. What changed:  additional instructions  Indication:  Itching   valACYclovir 500 MG tablet Commonly known as:  VALTREX TAKE 1 TABLET(500 MG) BY MOUTH TWICE DAILY AS NEEDED What changed:  See the new instructions.  Indication:  Herpes Simplex Infection   vitamin A 10000 UNIT capsule Generic drug:  vitamin A Take 10,000 Units by mouth daily.  Indication:  Vitamin A Deficiency   vitamin B-12 1000 MCG tablet Commonly known as:  CYANOCOBALAMIN Take 1,000 mcg by mouth daily.  Indication:  Inadequate Vitamin B12   vitamin E 200 UNIT capsule Take 200 Units by mouth daily.  Indication:  Deficiency of Vitamin E      Follow-up Information    [facility] on 05/01/2019.   Why:  Please attend your appointment at  2:40PM with Dr. Hassan Buckler.  Dr. Starleen Blue no longer works there.  Contact information: [place] [id number] [phone]           Follow-up recommendations:  Activity:  as tolerated Diet:  heart healthy diet  Comments:  Follow up with [facility] on 05/01/19  Signed: Waylan Boga, NP 04/29/2019, 11:37 AM

## 2019-04-29 NOTE — Progress Notes (Signed)
Recreation Therapy Notes   Date: 04/29/2019  Time: 9:30 am   Location: Craft room   Behavioral response: N/A   Intervention Topic: Coping skills  Discussion/Intervention: Patient did not attend group.   Clinical Observations/Feedback:  Patient did not attend group.   Anel Creighton LRT/CTRS        [place] 04/29/2019 10:44 AM

## 2019-04-29 NOTE — BHH Group Notes (Signed)
LCSW Group Therapy Note   04/29/2019 1:00 PM  Type of Therapy and Topic:  Group Therapy:  Overcoming Obstacles   Participation Level:  Did Not Attend   Description of Group:    In this group patients will be encouraged to explore what they see as obstacles to their own wellness and recovery. They will be guided to discuss their thoughts, feelings, and behaviors related to these obstacles. The group will process together ways to cope with barriers, with attention given to specific choices patients can make. Each patient will be challenged to identify changes they are motivated to make in order to overcome their obstacles. This group will be process-oriented, with patients participating in exploration of their own experiences as well as giving and receiving support and challenge from other group members.   Therapeutic Goals: 1. Patient will identify personal and current obstacles as they relate to admission. 2. Patient will identify barriers that currently interfere with their wellness or overcoming obstacles.  3. Patient will identify feelings, thought process and behaviors related to these barriers. 4. Patient will identify two changes they are willing to make to overcome these obstacles:      Summary of Patient Progress X   Therapeutic Modalities:   Cognitive Behavioral Therapy Solution Focused Therapy Motivational Interviewing Relapse Prevention Therapy  Assunta Curtis, MSW, LCSW 04/29/2019 12:31 PM

## 2019-04-29 NOTE — BHH Suicide Risk Assessment (Signed)
[facility] Discharge Suicide Risk Assessment   Principal Problem: Bipolar I disorder, most recent episode depressed ([place]) Discharge Diagnoses: Principal Problem:   Bipolar I disorder, most recent episode depressed ([place]) Active Problems:   Hypothyroidism   Chronic back pain   Opiate dependence ([place])  Patient is a 63 year old diagnosed with Bipolar Disorder, depressed who was admitted from the ED for stabilization and treatment of worsening depression along with tactile hallucinations.  Patient reports she is doing much better with her depression, denies any psychosis and reports some anxiety but no other concerns  Patient states her daughter and sister are supportive and that she has no thoughts or plans of hurting herself or others Total Time spent with patient: 30 minutes  Musculoskeletal: Strength & Muscle Tone: within normal limits Gait & Station: normal Patient leans: N/A  Psychiatric Specialty Exam: Review of Systems  Constitutional: Negative.  Negative for fever and malaise/fatigue.  HENT: Negative.  Negative for congestion, hearing loss and sore throat.   Eyes: Negative.  Negative for discharge and redness.  Cardiovascular: Negative.  Negative for chest pain and palpitations.  Gastrointestinal: Negative.  Negative for abdominal pain, constipation, diarrhea, heartburn, nausea and vomiting.  Musculoskeletal: Positive for neck pain. Negative for falls and myalgias.  Neurological: Positive for tingling, tremors and sensory change. Negative for dizziness, seizures, weakness and headaches.  Psychiatric/Behavioral: Negative for depression, hallucinations, memory loss, substance abuse and suicidal ideas. The patient is nervous/anxious. The patient does not have insomnia.     Blood pressure 139/84, pulse 98, temperature 97.8 F (36.6 C), temperature source Oral, resp. rate 18, height 4\' 11"  (1.499 m), weight 41.3 kg, SpO2 100 %.Body mass index is 18.38 kg/m.  General Appearance: Casual   Eye Contact::  Good  Speech:  Clear and Coherent and Normal Rate409  Volume:  Normal  Mood:  Anxious  Affect:  Congruent and Full Range  Thought Process:  Coherent, Goal Directed and Descriptions of Associations: Intact  Orientation:  Full (Time, Place, and Person)  Thought Content:  Logical and Rumination  Suicidal Thoughts:  No  Homicidal Thoughts:  No  Memory:  Immediate;   Fair Recent;   Fair Remote;   Fair  Judgement:  Intact  Insight:  Present  Psychomotor Activity:  Normal  Concentration:  Fair  Recall:  [employer] [place]  Language: Fair  Akathisia:  No  Handed:  Right  AIMS (if indicated):     Assets:  Desire for Improvement Financial Resources/Insurance Housing Leisure Time Social Support Talents/Skills  Sleep:  Number of Hours: 6.5  Cognition: WNL  ADL's:  Intact   Mental Status Per Nursing Assessment::   On Admission:  NA  Demographic Factors:  Caucasian and Living alone  Loss Factors: Decline in physical health  Historical Factors: Impulsivity  Risk Reduction Factors:   Sense of responsibility to family, Positive social support and Positive therapeutic relationship  Continued Clinical Symptoms:  Bipolar Disorder:   Depressive phase Chronic Pain More than one psychiatric diagnosis Previous Psychiatric Diagnoses and Treatments  Cognitive Features That Contribute To Risk:  None    Suicide Risk:  Minimal: No identifiable suicidal ideation.  Patients presenting with no risk factors but with morbid ruminations; may be classified as minimal risk based on the severity of the depressive symptoms  Follow-up Information    [facility] on 05/01/2019.   Why:  Please attend your appointment at 2:40PM with Dr. Hassan Buckler.  Dr. Starleen Blue no longer works there.  Contact information:  [address] [place] [address] [phone]           Plan Of Care/Follow-up recommendations:  Activity:  as tolerated Diet:  heart  healthy diet Other:  keep follow up appointments and take medications as prescribed  Hampton Abbot, MD 04/29/2019, 11:59 AM

## 2019-04-29 NOTE — Progress Notes (Signed)
Recreation Therapy Notes  INPATIENT RECREATION TR PLAN  Patient Details Name: Leslie Wong MRN: [id number] DOB: Jan 08, 1956 Today's Date: 04/29/2019  Rec Therapy Plan Is patient appropriate for Therapeutic Recreation?: Yes Treatment times per week: at least 3 Estimated Length of Stay: 5-7 days TR Treatment/Interventions: Group participation (Comment)  Discharge Criteria Pt will be discharged from therapy if:: Discharged Treatment plan/goals/alternatives discussed and agreed upon by:: Patient/family  Discharge Summary Short term goals set: Patient will engage in groups without prompting or encouragement from LRT x3 group sessions within 5 recreation therapy group sessions Short term goals met: Not met Reason goals not met: Patient did not attend any groups Therapeutic equipment acquired: N/A Reason patient discharged from therapy: Discharge from hospital Pt/family agrees with progress & goals achieved: Yes Date patient discharged from therapy: 04/29/19   Daneille Desilva 04/29/2019, 10:45 AM

## 2019-05-01 DIAGNOSIS — M5136 Other intervertebral disc degeneration, lumbar region: Secondary | ICD-10-CM | POA: Diagnosis not present

## 2019-05-01 DIAGNOSIS — M1288 Other specific arthropathies, not elsewhere classified, other specified site: Secondary | ICD-10-CM | POA: Diagnosis not present

## 2019-05-01 DIAGNOSIS — M545 Low back pain: Secondary | ICD-10-CM | POA: Diagnosis not present

## 2019-05-01 DIAGNOSIS — M25561 Pain in right knee: Secondary | ICD-10-CM | POA: Diagnosis not present

## 2019-05-02 DIAGNOSIS — M5030 Other cervical disc degeneration, unspecified cervical region: Secondary | ICD-10-CM | POA: Diagnosis not present

## 2019-05-02 DIAGNOSIS — M5412 Radiculopathy, cervical region: Secondary | ICD-10-CM | POA: Diagnosis not present

## 2019-05-03 ENCOUNTER — Ambulatory Visit (INDEPENDENT_AMBULATORY_CARE_PROVIDER_SITE_OTHER): Payer: Medicare Other | Admitting: Family Medicine

## 2019-05-03 ENCOUNTER — Encounter: Payer: Self-pay | Admitting: Family Medicine

## 2019-05-03 DIAGNOSIS — F3130 Bipolar disorder, current episode depressed, mild or moderate severity, unspecified: Secondary | ICD-10-CM

## 2019-05-03 MED ORDER — ONDANSETRON 4 MG PO TBDP: 4.0000 mg | ORAL_TABLET | Freq: Three times a day (TID) | ORAL | 1 refills | Status: DC | PRN

## 2019-05-03 MED ORDER — ALBUTEROL SULFATE HFA 108 (90 BASE) MCG/ACT IN AERS: 2.0000 | INHALATION_SPRAY | Freq: Four times a day (QID) | RESPIRATORY_TRACT | 1 refills | Status: DC | PRN
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
## 2019-05-06 ENCOUNTER — Telehealth: Payer: Self-pay | Admitting: Family Medicine

## 2019-05-06 DIAGNOSIS — F312 Bipolar disorder, current episode manic severe with psychotic features: Secondary | ICD-10-CM | POA: Diagnosis not present

## 2019-05-06 DIAGNOSIS — F411 Generalized anxiety disorder: Secondary | ICD-10-CM | POA: Diagnosis not present

## 2019-05-06 DIAGNOSIS — G20 Parkinson's disease: Secondary | ICD-10-CM | POA: Diagnosis not present

## 2019-05-06 DIAGNOSIS — F062 Psychotic disorder with delusions due to known physiological condition: Secondary | ICD-10-CM | POA: Diagnosis not present
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
## 2019-05-06 NOTE — Telephone Encounter (Signed)
Called daughter.  Patient has psychiatry f/u today.  It makes sense to get patient to that appointment, assuming she doesn't need ER eval in the meantime.  Routine cautions given. Daughter will get patient to psych this afternoon.    Daughter wanted to get access to mychart to send photos- how can this be set up?  Can you help her with this?  Thanks.

## 2019-05-06 NOTE — Telephone Encounter (Signed)
Left msg for [place]. I provided the [place] number [phone] for questions on how to upload pics on another pts mychart account.
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
## 2019-05-07 ENCOUNTER — Telehealth: Payer: Self-pay | Admitting: Family Medicine

## 2019-05-07 NOTE — Telephone Encounter (Signed)
Call from daughter.  Per daughter, psych MD rec'd placement and suspected med noncompliance.  Reportedly psych MD was going to contact [facility].  I will defer at this point, will await psych input/report.  The connection on the call went bad and I got cut off.  I didn't hang up on her.  I called and LMOVM about the lost connection.  I appreciate the help of all involved.

## 2019-05-07 NOTE — Telephone Encounter (Signed)
Best number [phone]  Lyn Henri @[place]  behavorial health Called pt is having urgent safety issues  And wanted to get your thoughts

## 2019-05-07 NOTE — Telephone Encounter (Signed)
I talked with Dr. Hassan Buckler.  Case discussed. She is going to call [facility] and I updated the patient's daughter.  I appreciate the help of all involved.   The patient needs extra help, in addition to the help that has been provided by her daughter.  Dr. Hassan Buckler thought that [facility]PS was the best option to support the patient at this point.  I believe that the patient's daughter has been acting in the best interest of the patient and I appreciate her effort.   Daughter thanked me for the call.

## 2019-05-07 NOTE — Telephone Encounter (Signed)
Daughter called back. Conversation finished.  She can check with psych clinic about call to [facility].  I can update daughter if/when I get extra information from psych.  App help of all involved.

## 2019-05-08 ENCOUNTER — Telehealth: Payer: Self-pay | Admitting: Family Medicine

## 2019-05-08 NOTE — Telephone Encounter (Signed)
Please check with daughter about patient's situation.  Psych MD was going to call [facility] yesterday.   Thanks.

## 2019-05-08 NOTE — Telephone Encounter (Signed)
Addendum to prev note.  Talked to [facility].  She has already called [facility] with high priority referral, now with [facility] eval/internention pending. App help of all involved.

## 2019-05-08 NOTE — Telephone Encounter (Signed)
Call from daughter to [facility].  Per daughter, patient hadn't seen APS yet.  Daughter reported deteriorating condition of patient but that the patient wasn't suicidal or homicidal.    I called the number listed in chart for Dowdle, LMOVM w/o confidential info, asking for call back.  I talked with on call provider, Clarene Duke.  Case discussed.  He'll check with Dowdle about status of APS notification, to expedite that in the AM if not already done by Dowdle.  Bowman agreed that APS wouldn't emergently intervene overnight w/o active suicidal or homicidal threat.  It stands re: routine ER cautions prev given to daughter by Leticia Penna.   I appreciate help of all involved.

## 2019-05-08 NOTE — Telephone Encounter (Signed)
Spoke with daughter.  She states that her mother's behavior continues to spiral out of control today.  She is talking nonsense and rambling out of her head.  Also, sitting in her car majority of the day with dog in the car talking about leaving, but did promise the daughter she wouldn't go anywhere but daughter is concerned she is a flight risk.  Also, her mother has made several large monetary purchases online today including 2 TV's and is trying to give 1 away to a neighbor.  She is also talking about purchasing a 19 Mustang GT.    Daughter is having difficult time redirecting her and is very concerned as her behaviors have significantly escalated today.    I spoke with Dr. Damita Dunnings and made him aware of patient behaviors and of no contact from APS or psyche today to patient or daughter.  Daughter states she also has called Lyn Henri and left a message for them earlier today without a response.  Dr. Damita Dunnings recommends patient be taken to ER if behaviors are danger/safety risk to her or to others.  He is reaching out now to Dr. Luetta Nutting call psyche team to discuss urgent intervention.    Daughter made aware and we or psyche will follow up as we have more information and direction.

## 2019-05-09 ENCOUNTER — Telehealth (INDEPENDENT_AMBULATORY_CARE_PROVIDER_SITE_OTHER): Payer: Medicare Other | Admitting: Family Medicine

## 2019-05-09 ENCOUNTER — Telehealth: Payer: Self-pay | Admitting: Family Medicine
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
## 2019-05-09 NOTE — Telephone Encounter (Signed)
[place]- see below.  Thanks.
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
## 2019-05-09 NOTE — Telephone Encounter (Addendum)
Cahrlene Garmin with APS called back she said that they just received the referral yesterday at 5pm and it can take 24hrs before someone calls them back, but she said a worker will call them today but if pt becomes manic in the mean time they can call the mobile crisis # [phone]  [facility] [name] and I did advised her of APS comments and gave her the mobile crisis #. Daughter did voice frustrations, she said even when they hospitalize her she will be okay for a while then will have another mental break down. Daughter thinks she needs to be in a home or somewhere ppermanently that can watch her because she is afraid she will end up harming herself or "burning down her apt"

## 2019-05-09 NOTE — Telephone Encounter (Signed)
Thanks.  Please check on this again later this AM.  Daughter was prev given ER cautions re: patient.

## 2019-05-09 NOTE — Telephone Encounter (Signed)
I called patient's daughter.  Patient reportedly hasn't been seen by APS.   I offered to start IVC process and daughter agreed, thanked me.  Called 911 in [place].  Was given [place] magistrate's number.  I called but was unable to contact a person in that office.  Called 911 again, transferred to [facility] and explained the clinical situation.  Dispatcher to contact law enforcement and they will call me if needed.  I appreciate the help of all involved.

## 2019-05-09 NOTE — Telephone Encounter (Signed)
Thank you.  Please call the mobile crisis line.  I can talk to them if needed.  Thanks.

## 2019-05-09 NOTE — Telephone Encounter (Signed)
[name] called back. APS has not contacted the family and things have escalated. Pt is talking about cutting up her carpet with a straight razor and deteriorating rapidly. [name] is desperate and asking for any help. She understands there is only so much you can do but she can't get in touch with psych office.

## 2019-05-09 NOTE — Telephone Encounter (Signed)
The goal was to avoid hospitalization if possible so I would defer the IVC for now.  APS eval is pending.  If she is at risk of self harm/etc then ER dispo stands.  If she needs ER eval then I think that EMS transport may be less stressful for patient instead of law enforcement transport.   Please updateMorrow Dowdle @[place]  behavorial health.  [phone].  If she thinks that IVC is most appropriate, I'll defer to her.  Thanks.

## 2019-05-09 NOTE — Progress Notes (Signed)
Opened in error

## 2019-05-09 NOTE — Telephone Encounter (Signed)
Called Social services [address] and no one was available to answer the phone, left VM requesting APS to check on pt ASAP and to call us back ASAP also

## 2019-05-09 NOTE — Telephone Encounter (Signed)
Officer called back, situation discussed.  He will go to patient's residence to check on her and then update me.  I will await report back.  App help of all involved.  I LMOVM for patient's daughter.  Routed to [place] as [place].

## 2019-05-09 NOTE — Telephone Encounter (Signed)
Called # given and it was the wrong # so I googled the mobile crisis # [phone], spoke to a rep who told me that we can't call this # at all that the pt has to call because they have to eval pt before they do anything and also even thought the pt is manic and not in her right mind right now they said that she would still have to agree for them to come out and eval her if she declines then they can't do anything about it. They recommended that Dr. Damita Dunnings or family go downtown and put IVC (involuntary commitment) papers on her if they thinks she needs to be hospitalized    Will Route to PCP and also [place] who said she will handle anything else Dr. Damita Dunnings needs for this pt

## 2019-05-10 NOTE — Telephone Encounter (Signed)
Spoke with Lyn Henri and provided her a detailed report of the developments with patient, daughter, APS and law enforcement over the past 24-48hours.   Ms. Hassan Buckler states that she has an appointment with patient this Monday and appreciates all of our help with making these connections and keeping her informed.   FYI to Dr. Damita Dunnings.

## 2019-05-10 NOTE — Telephone Encounter (Signed)
Late entry.  I called patient's daughter around 105 yesterday afternoon.  Discussed with her.  Long enforcement did go check on the patient.  At that time Adult Protective Services came to the patient's residence.  Per report of the daughter, Adult Protective Services only recommended observation.  The patient was not transported to the hospital. Daughter was at the residence with the patient along with long enforcement and Adult Scientist, forensic.  Daughter expressed frustration that Adult Protective Services could not place the patient sooner.  We talked about options.  If the patient's condition continues to decline then her daughter can start IVC paperwork for inpatient admission hopefully followed up by a long-term psychiatric care at a facility.  Otherwise she can report on the patient's situation to Adult Protective Services.  I will defer for now.  Her daughter could not think of anything else that I could do at this point.  She thanked me for the call.  Please update psychiatry clinic as FYI.  If psychiatry has anything else to add please let me know so I can update the daughter.  Thanks.

## 2019-05-12 NOTE — Telephone Encounter (Signed)
Noted.  Thanks.  We have been in contact with psychiatry and law enforcement.  Adult Protective Services has been notified and has established contact with the patient.  And we have been in contact with the patient's family. I am glad she has psychiatry follow-up pending.  I need psychiatry input on medication management. The patient's daughter is aware that she can start IVC process or dial 911 if the patient's condition deteriorates further. I appreciate the help of all involved and I think we at [facility] have done all that we could possibly do for this patient during this episode.

## 2019-05-16 NOTE — Telephone Encounter (Signed)
I think that having a guardian makes sense.  Please call her daughter and make sure she is aware of all of this.   If she objects (or if she wants to be the guardian) then let me know.   I can work on the letter and I'll work on the [employer]. Please get me a blank copy.    Many thanks.

## 2019-05-16 NOTE — Telephone Encounter (Signed)
Leslie Wong with [employer] [place] called the office today in regards to the patient.   She received the medical records from our office yesterday and based on the case notes she wanted to know if our office recommended the patient be appointed a guardian. If so she would need a letter from Dr Damita Dunnings recommending this.   Also stated that their plan is to place the patient in a facility and would need an FL2 in order to do so.    If any additional information is needed please contact  Vernie Shanks - [phone]

## 2019-05-16 NOTE — Telephone Encounter (Signed)
Spoke with patient's daughter, [name].  She is not sure that she would be able to take on the role of a guardian but would like for her mother to have one and discuss this further with Abigail.   I did leave a message for Abigail with APS to please call and discuss with [name] and that Dr. Damita Dunnings is happy to write a letter of recommendation for this and is working on her FL2.  Daughter will wait for a call from APS to follow up on the plans for her mother.  Abigail to call back if any further questions or concerns.

## 2019-05-17 NOTE — Telephone Encounter (Signed)
Letter done.  I am working on [employer].  Thanks.

## 2019-05-20 NOTE — Telephone Encounter (Signed)
FL 2 done to the best of my ability.  There are some items that have to be left blank, such as her admission date.  Please print a med list to attach along with the most recent letter. Many thanks.

## 2019-05-21 NOTE — Telephone Encounter (Signed)
Tried to contact Abigail (APP) about the letter and the FL2 but there was no answer and VM is full.

## 2019-05-23 NOTE — Telephone Encounter (Signed)
Spoken to [facility] and notified her that letter and FL2 form is ready for pick up. Left in the office.

## 2019-05-24 ENCOUNTER — Emergency Department
Admission: EM | Admit: 2019-05-24 | Discharge: 2019-05-27 | Disposition: A | Discharge status: Other Acute Inpatient Hospital | Payer: Medicare Other | Attending: Emergency Medicine | Admitting: Emergency Medicine

## 2019-05-24 ENCOUNTER — Encounter: Payer: Self-pay | Admitting: Emergency Medicine

## 2019-05-24 ENCOUNTER — Other Ambulatory Visit: Payer: Self-pay

## 2019-05-24 DIAGNOSIS — E039 Hypothyroidism, unspecified: Secondary | ICD-10-CM | POA: Insufficient documentation

## 2019-05-24 DIAGNOSIS — Z1159 Encounter for screening for other viral diseases: Secondary | ICD-10-CM | POA: Insufficient documentation

## 2019-05-24 DIAGNOSIS — F3164 Bipolar disorder, current episode mixed, severe, with psychotic features: Secondary | ICD-10-CM

## 2019-05-24 DIAGNOSIS — F1120 Opioid dependence, uncomplicated: Secondary | ICD-10-CM

## 2019-05-24 DIAGNOSIS — Z87891 Personal history of nicotine dependence: Secondary | ICD-10-CM | POA: Insufficient documentation

## 2019-05-24 DIAGNOSIS — F310 Bipolar disorder, current episode hypomanic: Secondary | ICD-10-CM | POA: Diagnosis present

## 2019-05-24 DIAGNOSIS — Z85820 Personal history of malignant melanoma of skin: Secondary | ICD-10-CM | POA: Insufficient documentation

## 2019-05-24 DIAGNOSIS — Z79899 Other long term (current) drug therapy: Secondary | ICD-10-CM | POA: Insufficient documentation

## 2019-05-24 LAB — CBC
HCT: 46 % (ref 36.0–46.0)
Hemoglobin: 15.3 g/dL — ABNORMAL HIGH (ref 12.0–15.0)
MCH: 32.6 pg (ref 26.0–34.0)
MCHC: 33.3 g/dL (ref 30.0–36.0)
MCV: 98.1 fL (ref 80.0–100.0)
Platelets: 293 10*3/uL (ref 150–400)
RBC: 4.69 MIL/uL (ref 3.87–5.11)
RDW: 13.1 % (ref 11.5–15.5)
WBC: 12.9 10*3/uL — ABNORMAL HIGH (ref 4.0–10.5)
nRBC: 0 % (ref 0.0–0.2)

## 2019-05-24 LAB — COMPREHENSIVE METABOLIC PANEL
ALT: 19 U/L (ref 0–44)
AST: 22 U/L (ref 15–41)
Albumin: 4.6 g/dL (ref 3.5–5.0)
Alkaline Phosphatase: 96 U/L (ref 38–126)
Anion gap: 8 (ref 5–15)
BUN: 20 mg/dL (ref 6–20)
CO2: 25 mmol/L (ref 22–32)
Calcium: 10.7 mg/dL — ABNORMAL HIGH (ref 8.9–10.3)
Chloride: 105 mmol/L (ref 98–111)
Creatinine, Ser: 0.8 mg/dL (ref 0.44–1.00)
GFR calc Af Amer: >60 mL/min (ref >60)
GFR calc non Af Amer: >60 mL/min (ref >60)
Glucose, Bld: 134 mg/dL — ABNORMAL HIGH (ref 70–99)
Potassium: 4.2 mmol/L (ref 3.5–5.1)
Sodium: 138 mmol/L (ref 135–145)
Total Bilirubin: 0.6 mg/dL (ref 0.3–1.2)
Total Protein: 7.9 g/dL (ref 6.5–8.1)

## 2019-05-24 LAB — URINE DRUG SCREEN, QUALITATIVE (ARMC ONLY)
Amphetamines, Ur Screen: NOT DETECTED
Barbiturates, Ur Screen: NOT DETECTED
Benzodiazepine, Ur Scrn: NOT DETECTED
Cannabinoid 50 Ng, Ur ~~LOC~~: POSITIVE — AB
Cocaine Metabolite,Ur ~~LOC~~: NOT DETECTED
MDMA (Ecstasy)Ur Screen: NOT DETECTED
Methadone Scn, Ur: NOT DETECTED
Opiate, Ur Screen: POSITIVE — AB
Phencyclidine (PCP) Ur S: NOT DETECTED
Tricyclic, Ur Screen: POSITIVE — AB

## 2019-05-24 LAB — ETHANOL: Alcohol, Ethyl (B): <10 mg/dL (ref <10)

## 2019-05-24 LAB — ACETAMINOPHEN LEVEL: Acetaminophen (Tylenol), Serum: <10 ug/mL — ABNORMAL LOW (ref 10–30)

## 2019-05-24 LAB — SALICYLATE LEVEL: Salicylate Lvl: <7 mg/dL (ref 2.8–30.0)

## 2019-05-24 MED ORDER — LEVOTHYROXINE SODIUM 88 MCG PO TABS
88.0000 ug | ORAL_TABLET | Freq: Every day | ORAL | Status: DC
  Administered 2019-05-25 – 2019-05-27 (×3): 88 ug via ORAL
  Filled 2019-05-24 (×3): qty 1

## 2019-05-24 MED ORDER — HYDROXYZINE HCL 25 MG PO TABS
50.0000 mg | ORAL_TABLET | Freq: Three times a day (TID) | ORAL | Status: DC | PRN
  Administered 2019-05-25 – 2019-05-26 (×5): 50 mg via ORAL
  Filled 2019-05-24 (×7): qty 2

## 2019-05-24 MED ORDER — HYDROCERIN EX CREA
1.0000 "application " | TOPICAL_CREAM | Freq: Two times a day (BID) | CUTANEOUS | Status: DC
  Administered 2019-05-25 – 2019-05-26 (×5): 1 via TOPICAL
  Filled 2019-05-24: qty 113
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
## 2019-05-24 NOTE — ED Provider Notes (Signed)
[facility] Emergency Department Provider Note   ____________________________________________   I have reviewed the triage vital signs and the nursing notes.   HISTORY  Chief Complaint IVC   History limited by: Poor historian   HPI Leslie Wong is a 63 y.o. female who presents to the emergency department today under IVC because of concern for aggressive and abnormal behavior. The patient states that she does not know why she was brought her. She states that she is taking the medications she is supposed to. She says that she was taken off some of her psychiatric medications because of concern for toxins in her body. The patient states that her main complaint is for neck pain secondary to spinal stenosis. The patient says that she has followed up with a neurosurgeon for that. Also complaining of itchy hands and cracked skin.     Records reviewed. Per medical record review patient has a history of depression, bipolar.   Past Medical History:  Diagnosis Date  . Back pain   . Cancer ([place])    skin  . Depression    BAD  . Gastroparesis   . GERD (gastroesophageal reflux disease)   . Hypothyroidism   . Insomnia   . Migraines   . Parkinson disease ([place])    per [facility], dx'd 2019  . Recurrent cold sores   . Shoulder bursitis   . Urge incontinence     Patient Active Problem List   Diagnosis Date Noted  . Bipolar I disorder, most recent episode depressed ([place]) 04/25/2019  . Opiate dependence ([place]) 04/25/2019  . Cracking skin 01/27/2019  . Delusion ([place]) 09/26/2018  . Night sweats 05/13/2018  . Pupil asymmetry 05/13/2018  . DDD (degenerative disc disease), cervical 04/23/2018  . Parkinson disease ([place])   . HLD (hyperlipidemia) 03/10/2016  . Cervical radiculopathy 07/30/2015  . Osteopenia 04/18/2014  . Shoulder pain 01/17/2013  . Chronic back pain 09/14/2011  . Hypercalcemia 03/25/2010  . INCONTINENCE, URGE 04/16/2009  . CARPAL TUNNEL  SYNDROME, BILATERAL 09/24/2008  . URETHRAL STRICTURE 06/13/2007  . Hypothyroidism 06/05/2007  . MIGRAINE HEADACHE 06/05/2007  . Gastroesophageal reflux disease with hiatal hernia 06/05/2007  . IRRITABLE BOWEL SYNDROME 06/05/2007  . FIBROCYSTIC BREAST DISEASE 06/05/2007    Past Surgical History:  Procedure Laterality Date  . ABDOMINAL HYSTERECTOMY  1999   total  . APPENDECTOMY    . ESOPHAGOGASTRODUODENOSCOPY  01/2006   negative except small hiatal hernia  . ESOPHAGOGASTRODUODENOSCOPY  02/24/2015   See report  . LAPAROSCOPY     for endometriosis  . OVARIAN CYST REMOVAL  1990   unilateral  . TONSILLECTOMY AND ADENOIDECTOMY      Prior to Admission medications   Medication Sig Start Date End Date Taking? Authorizing Provider  albuterol (VENTOLIN HFA) 108 (90 Base) MCG/ACT inhaler Inhale 2 puffs into the lungs every 6 (six) hours as needed for wheezing or shortness of breath. Patient not taking: Reported on 05/03/2019 05/03/19   Tonia Ghent, MD  ascorbic acid (VITAMIN C) 500 MG tablet Take 500 mg by mouth daily.    [provider]  Biotin w/ Vitamins C & E ([place]) 1250-7.5-7.5 MCG-MG-UNT CHEW Chew by mouth daily.    [provider]  cholecalciferol (VITAMIN D) 1000 units tablet Take 1 tablet (1,000 Units total) by mouth daily. 06/22/17   Tonia Ghent, MD  hydrocerin (EUCERIN) CREA Apply 1 application topically 2 (two) times daily. 04/29/19   Patrecia Pour, NP  hydrOXYzine (ATARAX/VISTARIL) 50 MG tablet Take 1 tablet (50 mg total) by mouth 3 (three) times daily as needed for itching or anxiety. 04/29/19   Patrecia Pour, NP  lamoTRIgine (LAMICTAL) 25 MG tablet Take 2 tablets (50 mg total) by mouth daily. 10/22/18   McNew, Tyson Babinski, MD  levothyroxine (SYNTHROID, LEVOTHROID) 88 MCG tablet Take 1 tablet (88 mcg total) by mouth daily. 01/25/19   Tonia Ghent, MD  Magnesium 250 MG TABS Take 250 mg by mouth.    [provider]  Multiple  Vitamin (MULTIVITAMIN) tablet Take 1 tablet by mouth daily.      [provider]  olanzapine zydis (ZYPREXA) 15 MG disintegrating tablet Take 1 tablet (15 mg total) by mouth at bedtime. 04/29/19   Patrecia Pour, NP  Omega-3 Fatty Acids (FISH OIL) 1000 MG CAPS Take 1 capsule (1,000 mg total) by mouth daily. 06/22/17   Tonia Ghent, MD  omeprazole (PRILOSEC) 20 MG capsule TAKE 1 CAPSULE BY MOUTH EVERY MORNING 45 MINUTES BEFORE BREAKFAST Patient taking differently: Take 20 mg by mouth daily.  02/21/19   Tonia Ghent, MD  ondansetron (ZOFRAN-ODT) 4 MG disintegrating tablet Take 1 tablet (4 mg total) by mouth 3 (three) times daily as needed for nausea or vomiting. 05/03/19   Tonia Ghent, MD  oxyCODONE (ROXICODONE) 5 MG immediate release tablet Take 1 tablet (5 mg total) by mouth every 8 (eight) hours as needed. Patient taking differently: Take 5 mg by mouth 6 (six) times daily.  09/20/18 09/20/19  Rudene Re, MD  senna-docusate (SENOKOT-S) 8.6-50 MG tablet Take 2 tablets by mouth daily.    [provider]  tiZANidine (ZANAFLEX) 4 MG tablet Take 1 tablet (4 mg total) by mouth 3 (three) times daily. 01/25/19   Tonia Ghent, MD  triamcinolone cream (KENALOG) 0.1 % Apply 1 application topically 2 (two) times daily. 04/29/19   Patrecia Pour, NP  valACYclovir (VALTREX) 500 MG tablet TAKE 1 TABLET(500 MG) BY MOUTH TWICE DAILY AS NEEDED Patient taking differently: Take 500 mg by mouth 2 (two) times daily.  04/08/19   Tonia Ghent, MD  vitamin A (VITAMIN A) 10000 UNIT capsule Take 10,000 Units by mouth daily.    [provider]  vitamin B-12 (CYANOCOBALAMIN) 1000 MCG tablet Take 1,000 mcg by mouth daily.    [provider]  vitamin E 200 UNIT capsule Take 200 Units by mouth daily.      [provider]    Allergies Cymbalta [duloxetine hcl]; Valproic acid; Aspirin; Atorvastatin; Baclofen; Codeine; Duloxetine; Erythromycin; Gabapentin;  Metoclopramide hcl; Nortriptyline; Nsaids; Nucynta er [tapentadol hcl er]; Nucynta [tapentadol]; Prednisone; Pregabalin; Seroquel [quetiapine fumerate]; Sulfa antibiotics; Topamax; and Vicodin [hydrocodone-acetaminophen]  Family History  Problem Relation Age of Onset  . Hypertension Mother   . Arthritis Mother   . Diabetes Mother   . Stroke Mother   . Cancer Father        lung  . Colon cancer Neg Hx   . Breast cancer Neg Hx     Social History Social History   Tobacco Use  . Smoking status: Former Smoker    Packs/day: 1.00    Years: 42.50    Pack years: 42.50    Types: Cigarettes  . Smokeless tobacco: Former Network engineer Use Topics  . Alcohol use: No    Alcohol/week: 0.0 standard drinks  . Drug use: No    Review of Systems Constitutional: No fever/chills Eyes: No visual changes. ENT:  No sore throat. Cardiovascular: Denies chest pain. Respiratory: Denies shortness of breath. Gastrointestinal: No abdominal pain.  No nausea, no vomiting.  No diarrhea.   Genitourinary: Negative for dysuria. Musculoskeletal: Positive for neck pain.  Skin: Positive for cracked skin on her hands.  Neurological: Negative for headaches, focal weakness or numbness.  ____________________________________________   PHYSICAL EXAM:  VITAL SIGNS: ED Triage Vitals  Enc Vitals Group     BP --      Pulse --      Resp --      Temp --      Temp src --      SpO2 --      Weight 05/24/19 1427 94 lb (42.6 kg)     Height 05/24/19 1427 4\' 11"  (1.499 m)     Head Circumference --      Peak Flow --      Pain Score 05/24/19 1426 10    Constitutional: Alert and oriented.  Eyes: Conjunctivae are normal.  ENT      Head: Normocephalic and atraumatic.      Nose: No congestion/rhinnorhea.      Mouth/Throat: Mucous membranes are moist.      Neck: No stridor. Hematological/Lymphatic/Immunilogical: No cervical lymphadenopathy. Cardiovascular: Normal rate, regular rhythm.  No murmurs, rubs, or  gallops. Respiratory: Normal respiratory effort without tachypnea nor retractions. Breath sounds are clear and equal bilaterally. No wheezes/rales/rhonchi. Gastrointestinal: Soft and non tender. No rebound. No guarding.  Genitourinary: Deferred Musculoskeletal: Normal range of motion in all extremities. No lower extremity edema. Neurologic:  Normal speech and language. No gross focal neurologic deficits are appreciated.  Skin: Dry skin on bilateral hands.  Psychiatric: Somewhat pressured speech.  ____________________________________________    LABS (pertinent positives/negatives)  Ethanol <10 UDS positive tricyclic, opiate, cannabinoid CMP wnl except glu 134, ca 10.7 CBC wbc 12.9, hgb 15.3, plt 293 ____________________________________________   EKG  None  ____________________________________________    RADIOLOGY  None  ____________________________________________   PROCEDURES  Procedures  ____________________________________________   INITIAL IMPRESSION / ASSESSMENT AND PLAN / ED COURSE  Pertinent labs & imaging results that were available during my care of the patient were reviewed by me and considered in my medical decision making (see chart for details).   Patient presented to the emergency department today under IVC because of concern for abnormal behavior. Will have psychiatry evaluate.   ____________________________________________   FINAL CLINICAL IMPRESSION(S) / ED DIAGNOSES  Abnormal behavior  Note: This dictation was prepared with Dragon dictation. Any transcriptional errors that result from this process are unintentional     Nance Pear, MD 05/24/19 2308

## 2019-05-24 NOTE — ED Notes (Signed)
Pharm called

## 2019-05-24 NOTE — ED Notes (Signed)
Pt dressed out by this tech. Pt belongings: Blue shirt Black bra Tan leggings Bland and white sandals 2 rings 3 necklaces Pair of earrings 1 green hair tie 1 black hair tie

## 2019-05-24 NOTE — ED Notes (Signed)
Pt tearful, anxious.  Requesting to be transferred to [facility] where her medical needs can be taken care of.  Requesting her medications.  MD made aware.

## 2019-05-24 NOTE — ED Notes (Signed)
Call for telepsych

## 2019-05-24 NOTE — ED Notes (Signed)
IVC PENDING  CONSULT ?
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
## 2019-05-24 NOTE — ED Notes (Signed)
Patient was walking a little unsteady and shuffling her feet, she says its due to her Parkinsons disease

## 2019-05-25 MED ORDER — LAMOTRIGINE 100 MG PO TABS
50.0000 mg | ORAL_TABLET | Freq: Every day | ORAL | Status: DC
  Administered 2019-05-25 – 2019-05-27 (×3): 50 mg via ORAL
  Filled 2019-05-25 (×4): qty 1

## 2019-05-25 MED ORDER — PANTOPRAZOLE SODIUM 40 MG PO TBEC
40.0000 mg | DELAYED_RELEASE_TABLET | Freq: Every day | ORAL | Status: DC
  Administered 2019-05-25 – 2019-05-27 (×3): 40 mg via ORAL
  Filled 2019-05-25 (×3): qty 1

## 2019-05-25 MED ORDER — OXYCODONE HCL 5 MG PO TABS
5.0000 mg | ORAL_TABLET | Freq: Three times a day (TID) | ORAL | Status: DC | PRN
  Administered 2019-05-25 – 2019-05-27 (×5): 5 mg via ORAL
  Filled 2019-05-25 (×6): qty 1

## 2019-05-25 MED ORDER — OLANZAPINE 5 MG PO TBDP
15.0000 mg | ORAL_TABLET | Freq: Every day | ORAL | Status: DC
  Administered 2019-05-25 – 2019-05-26 (×2): 15 mg via ORAL
  Filled 2019-05-25 (×4): qty 1

## 2019-05-25 NOTE — ED Provider Notes (Signed)
-----------------------------------------   4:26 AM on 05/25/2019 -----------------------------------------  Patient evaluated by [facility] psychiatrist Dr. Cherly Beach who recommends inpatient hospitalization. No medication recommendations.   Paulette Blanch, MD 05/25/19 [phone]
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
## 2019-05-25 NOTE — BH Assessment (Signed)
Referral information for geriatric psychiatry specialty placement was sent to the following:   [facility]  [facility]

## 2019-05-25 NOTE — ED Notes (Signed)
Report to [facility], Dr Cherly Beach, att
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
## 2019-05-25 NOTE — ED Notes (Signed)
SOC with pt att
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
## 2019-05-25 NOTE — ED Notes (Signed)
Pt ambulatory to toilet, slowly but without distress or needing asistance
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
## 2019-05-25 NOTE — ED Notes (Signed)
Pt to NS given water and crackers and PB d/t pt appearing cachetic
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
## 2019-05-26 DIAGNOSIS — F310 Bipolar disorder, current episode hypomanic: Secondary | ICD-10-CM

## 2019-05-26 MED ORDER — OLANZAPINE 5 MG PO TABS
5.0000 mg | ORAL_TABLET | Freq: Every day | ORAL | Status: DC
  Administered 2019-05-27: 5 mg via ORAL
  Filled 2019-05-26: qty 1

## 2019-05-26 NOTE — ED Notes (Signed)
Pt. Calm and cooperative.  Pt. States no SI or HI feelings.  Pt. Asked if she would be able to call sister tomorrow.  Pt. Told we could help her with that in morning.  Pt. Satisfied with answer.  Pt. Requested to shorten scrub bottoms and use excess material to make hair tie, request granted.

## 2019-05-26 NOTE — ED Notes (Signed)
Hourly rounding reveals patient in room. No complaints, stable, in no acute distress. Q15 minute rounds and monitoring via Security Cameras to continue. 

## 2019-05-26 NOTE — ED Notes (Signed)
Hourly rounding reveals patient sleeping in room. No complaints, stable, in no acute distress. Q15 minute rounds and monitoring via Security Cameras to continue. 

## 2019-05-26 NOTE — ED Provider Notes (Signed)
-----------------------------------------   12:41 AM on 05/26/2019 -----------------------------------------   Blood pressure (!) 137/102, pulse 94, temperature 99 F (37.2 C), temperature source Oral, resp. rate 18, height 4\' 11"  (1.499 m), weight 42.6 kg, SpO2 100 %.  The patient is calm and cooperative at this time.  There have been no acute events since the last update.  Awaiting disposition plan from Behavioral Medicine team.    Merlyn Lot, MD 05/26/19 [phone]

## 2019-05-26 NOTE — ED Notes (Signed)
Pt.  Requested and was given additional crackers and drink.

## 2019-05-26 NOTE — ED Notes (Signed)
Scrambled eggs order for patient. Pt stated she can eat eat the "hard" biscuit.

## 2019-05-26 NOTE — ED Notes (Signed)
Pt. Requested and was given meal tray with drink.
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
## 2019-05-26 NOTE — ED Notes (Signed)
Offered pt [place] tray. Pt states [place] makes her sick. Gave pat chocolate milk, a banana, potato chips and saltine crackers.

## 2019-05-26 NOTE — ED Notes (Signed)
Pt taking a shower. No behavioral issues. Maintained on 15 minute checks and observation by security camera for safety.

## 2019-05-26 NOTE — ED Notes (Signed)
Pt angry she cannot go home. "I fell like a prisoner. I haven't done anything wrong."  Pt wanting to be transferred to [facility] because she is unhappy with the care she has received. Maintained on 15 minute checks and observation by security camera for safety.

## 2019-05-26 NOTE — ED Notes (Signed)
Report to include Situation, Background, Assessment, and Recommendations received from Amt B. RN. Patient alert and oriented, warm and dry, in no acute distress. Patient denies SI, HI, AVH and pain. Patient made aware of Q15 minute rounds and security cameras for their safety. Patient instructed to come to me with needs or concerns.

## 2019-05-26 NOTE — ED Notes (Signed)
Pt staling she needs to be released from the hospital to write a letter to a judge on behalf of her son. Her son is in federal prison and may be released early if she writes a letter explaining all of her health issues.

## 2019-05-26 NOTE — ED Notes (Signed)
Pt. Up using bathroom.  Pt. Returned to room with steady gait.

## 2019-05-27 ENCOUNTER — Encounter: Payer: Self-pay | Admitting: Behavioral Health

## 2019-05-27 ENCOUNTER — Inpatient Hospital Stay
Admission: AD | Admit: 2019-05-27 | Discharge: 2019-05-30 | DRG: 885 | Disposition: A | Discharge status: Home / Self Care | Payer: Medicare Other | Attending: Psychiatry | Admitting: Psychiatry

## 2019-05-27 ENCOUNTER — Other Ambulatory Visit: Payer: Self-pay

## 2019-05-27 DIAGNOSIS — J449 Chronic obstructive pulmonary disease, unspecified: Secondary | ICD-10-CM | POA: Diagnosis present

## 2019-05-27 DIAGNOSIS — Z87891 Personal history of nicotine dependence: Secondary | ICD-10-CM | POA: Diagnosis not present

## 2019-05-27 DIAGNOSIS — F3163 Bipolar disorder, current episode mixed, severe, without psychotic features: Principal | ICD-10-CM | POA: Diagnosis present

## 2019-05-27 DIAGNOSIS — Z1159 Encounter for screening for other viral diseases: Secondary | ICD-10-CM | POA: Diagnosis not present

## 2019-05-27 DIAGNOSIS — F1210 Cannabis abuse, uncomplicated: Secondary | ICD-10-CM

## 2019-05-27 DIAGNOSIS — K219 Gastro-esophageal reflux disease without esophagitis: Secondary | ICD-10-CM | POA: Diagnosis present

## 2019-05-27 DIAGNOSIS — E039 Hypothyroidism, unspecified: Secondary | ICD-10-CM | POA: Diagnosis present

## 2019-05-27 DIAGNOSIS — F1120 Opioid dependence, uncomplicated: Secondary | ICD-10-CM | POA: Diagnosis present

## 2019-05-27 LAB — SARS CORONAVIRUS 2 BY RT PCR (HOSPITAL ORDER, PERFORMED IN ~~LOC~~ HOSPITAL LAB): SARS Coronavirus 2: NEGATIVE

## 2019-05-27 MED ORDER — OLANZAPINE 5 MG PO TBDP: 10.0000 mg | ORAL_TABLET | Freq: Three times a day (TID) | ORAL | Status: DC | PRN

## 2019-05-27 MED ORDER — MAGNESIUM HYDROXIDE 400 MG/5ML PO SUSP: 30.0000 mL | Freq: Every day | ORAL | Status: DC | PRN

## 2019-05-27 MED ORDER — HYDROXYZINE HCL 50 MG PO TABS
50.0000 mg | ORAL_TABLET | Freq: Three times a day (TID) | ORAL | Status: DC | PRN
  Administered 2019-05-27 – 2019-05-29 (×5): 50 mg via ORAL
  Filled 2019-05-27 (×6): qty 1

## 2019-05-27 MED ORDER — PANTOPRAZOLE SODIUM 40 MG PO TBEC
40.0000 mg | DELAYED_RELEASE_TABLET | Freq: Every day | ORAL | Status: DC
  Administered 2019-05-28 – 2019-05-30 (×3): 40 mg via ORAL
  Filled 2019-05-27 (×3): qty 1

## 2019-05-27 MED ORDER — ALUM & MAG HYDROXIDE-SIMETH 200-200-20 MG/5ML PO SUSP
30.0000 mL | ORAL | Status: DC | PRN
  Administered 2019-05-28 (×2): 30 mL via ORAL
  Filled 2019-05-27 (×2): qty 30

## 2019-05-27 MED ORDER — ALBUTEROL SULFATE HFA 108 (90 BASE) MCG/ACT IN AERS
2.0000 | INHALATION_SPRAY | Freq: Four times a day (QID) | RESPIRATORY_TRACT | Status: DC | PRN
  Filled 2019-05-27: qty 6.7

## 2019-05-27 MED ORDER — OMEGA-3-ACID ETHYL ESTERS 1 G PO CAPS
1.0000 g | ORAL_CAPSULE | Freq: Every day | ORAL | Status: DC
  Administered 2019-05-28 – 2019-05-30 (×3): 1 g via ORAL
  Filled 2019-05-27 (×4): qty 1

## 2019-05-27 MED ORDER — ZIPRASIDONE MESYLATE 20 MG IM SOLR: 20.0000 mg | INTRAMUSCULAR | Status: DC | PRN

## 2019-05-27 MED ORDER — LAMOTRIGINE 25 MG PO TABS
50.0000 mg | ORAL_TABLET | Freq: Every day | ORAL | Status: DC
  Administered 2019-05-28 – 2019-05-30 (×3): 50 mg via ORAL
  Filled 2019-05-27 (×3): qty 2

## 2019-05-27 MED ORDER — SENNOSIDES-DOCUSATE SODIUM 8.6-50 MG PO TABS
2.0000 | ORAL_TABLET | Freq: Every day | ORAL | Status: DC
  Administered 2019-05-28 – 2019-05-30 (×3): 2 via ORAL
  Filled 2019-05-27 (×4): qty 2

## 2019-05-27 MED ORDER — VITAMIN D3 25 MCG (1000 UNIT) PO TABS
1000.0000 [IU] | ORAL_TABLET | Freq: Every day | ORAL | Status: DC
  Administered 2019-05-27 – 2019-05-30 (×4): 1000 [IU] via ORAL
  Filled 2019-05-27 (×8): qty 1

## 2019-05-27 MED ORDER — OLANZAPINE 5 MG PO TBDP
20.0000 mg | ORAL_TABLET | Freq: Every day | ORAL | Status: DC
  Administered 2019-05-27: 20 mg via ORAL
  Filled 2019-05-27: qty 4

## 2019-05-27 MED ORDER — VALACYCLOVIR HCL 500 MG PO TABS
500.0000 mg | ORAL_TABLET | Freq: Two times a day (BID) | ORAL | Status: DC
  Administered 2019-05-27 – 2019-05-30 (×6): 500 mg via ORAL
  Filled 2019-05-27 (×8): qty 1

## 2019-05-27 MED ORDER — VITAMIN C 500 MG PO TABS
500.0000 mg | ORAL_TABLET | Freq: Every day | ORAL | Status: DC
  Administered 2019-05-27 – 2019-05-30 (×4): 500 mg via ORAL
  Filled 2019-05-27 (×5): qty 1

## 2019-05-27 MED ORDER — HYDROCERIN EX CREA
1.0000 "application " | TOPICAL_CREAM | Freq: Two times a day (BID) | CUTANEOUS | Status: DC
  Administered 2019-05-27 – 2019-05-30 (×6): 1 via TOPICAL
  Filled 2019-05-27 (×2): qty 113

## 2019-05-27 MED ORDER — ADULT MULTIVITAMIN W/MINERALS CH
1.0000 | ORAL_TABLET | Freq: Every day | ORAL | Status: DC
  Administered 2019-05-27 – 2019-05-30 (×4): 1 via ORAL
  Filled 2019-05-27 (×4): qty 1

## 2019-05-27 MED ORDER — VITAMIN B-12 1000 MCG PO TABS
1000.0000 ug | ORAL_TABLET | Freq: Every day | ORAL | Status: DC
  Administered 2019-05-27 – 2019-05-30 (×4): 1000 ug via ORAL
  Filled 2019-05-27 (×4): qty 1

## 2019-05-27 MED ORDER — LEVOTHYROXINE SODIUM 88 MCG PO TABS
88.0000 ug | ORAL_TABLET | Freq: Every day | ORAL | Status: DC
  Administered 2019-05-29 – 2019-05-30 (×2): 88 ug via ORAL
  Filled 2019-05-27 (×3): qty 1

## 2019-05-27 MED ORDER — OXYCODONE HCL 5 MG PO TABS
5.0000 mg | ORAL_TABLET | Freq: Two times a day (BID) | ORAL | Status: DC | PRN
  Administered 2019-05-27 – 2019-05-28 (×2): 5 mg via ORAL
  Filled 2019-05-27 (×2): qty 1

## 2019-05-27 MED ORDER — LORAZEPAM 1 MG PO TABS: 1.0000 mg | ORAL_TABLET | ORAL | Status: DC | PRN

## 2019-05-27 MED ORDER — ACETAMINOPHEN 325 MG PO TABS: 650.0000 mg | ORAL_TABLET | Freq: Four times a day (QID) | ORAL | Status: DC | PRN

## 2019-05-27 MED ORDER — ONDANSETRON 4 MG PO TBDP
4.0000 mg | ORAL_TABLET | Freq: Three times a day (TID) | ORAL | Status: DC | PRN
  Administered 2019-05-28 – 2019-05-29 (×3): 4 mg via ORAL
  Filled 2019-05-27 (×3): qty 1

## 2019-05-27 NOTE — ED Notes (Signed)
Patient asked writer why is she going to the inpatient unit, patient feels that she should be going to a rehabilitation center. Patient says I dont need to go to inpatient, is there anyway I can talk with the psychiatrist

## 2019-05-27 NOTE — ED Notes (Signed)
Hourly rounding reveals patient sleeping in room. No complaints, stable, in no acute distress. Q15 minute rounds and monitoring via Security Cameras to continue. 

## 2019-05-27 NOTE — ED Notes (Signed)
Hourly rounding reveals patient in day room. No complaints, stable, in no acute distress. Q15 minute rounds and monitoring via Security Cameras to continue. 
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
## 2019-05-27 NOTE — BH Assessment (Signed)
Patient is to be admitted to [facility] by Dr. Leverne Humbles.  Attending Physician will be Dr. Weber Cooks.   Patient has been assigned to room 307, by [facility] Charge Nurse T[place].   Intake Paper Work has been signed and placed on patient chart.  ER staff is aware of the admission:  Anne Ng, ER Secretary    Dr. Myrene Buddy, ER MD   Donneta Romberg, Patient's Nurse   Elberta Fortis, Patient Access.

## 2019-05-27 NOTE — BH Assessment (Signed)
Pt information provided to [employer] RN - pending bed assignment.

## 2019-05-27 NOTE — Plan of Care (Signed)
Patient newly admitted to the unit, goals not yet met.

## 2019-05-27 NOTE — ED Provider Notes (Signed)
-----------------------------------------   3:15 AM on 05/27/2019 -----------------------------------------   Blood pressure (!) 147/77, pulse (!) 101, temperature 99 F (37.2 C), temperature source Oral, resp. rate 18, height 4\' 11"  (1.499 m), weight 42.6 kg, SpO2 98 %.  The patient is calm and cooperative at this time.  There have been no acute events since the last update.  Awaiting disposition plan from Behavioral Medicine team.    Merlyn Lot, MD 05/27/19 [phone]

## 2019-05-27 NOTE — Tx Team (Signed)
Initial Treatment Plan 05/27/2019 5:19 PM Leslie Wong [id number]    PATIENT STRESSORS: Health problems Medication change or noncompliance Substance abuse   PATIENT STRENGTHS: Ability for insight Capable of independent living   PATIENT IDENTIFIED PROBLEMS: Depression  Anxiety  Pain                 DISCHARGE CRITERIA:  Ability to meet basic life and health needs Adequate post-discharge living arrangements Improved stabilization in mood, thinking, and/or behavior Medical problems require only outpatient monitoring  PRELIMINARY DISCHARGE PLAN: Outpatient therapy Return to previous living arrangement  PATIENT/FAMILY INVOLVEMENT: This treatment plan has been presented to and reviewed with the patient, Leslie Wong, and/or family member.  The patient and family have been given the opportunity to ask questions and make suggestions.  Elzie Rings [name], RN 05/27/2019, 5:19 PM

## 2019-05-27 NOTE — Plan of Care (Signed)
Leslie Wong is a 63 y.o. female patient admitted from ED awake, alert - oriented  X 4 - no acute distress noted.  VSS - Blood pressure (!) 146/87, pulse (!) 108, resp. rate 18, SpO2 99 %.       Orientation to room, and floor completed with information packet given to patient/family.  Patient declined safety video at this time.  Admission INP armband ID verified with patient/family, and in place.   SR up x 2, fall assessment complete, with patient and family able to verbalize understanding of risk associated with falls, and verbalized understanding to call nsg before up out of bed.  Call light within reach, patient able to voice, and demonstrate understanding.  Skin, clean-dry- intact without evidence of bruising, or skin tears.   No evidence of skin break down noted on exam.     Will cont to eval and treat per MD orders.  Geraldo Docker, RN 05/27/2019 4:51 PM  Problem: Education: Goal: Knowledge of  General Education information/materials will improve Outcome: Not Progressing Goal: Emotional status will improve Outcome: Not Progressing Goal: Mental status will improve Outcome: Not Progressing Goal: Verbalization of understanding the information provided will improve Outcome: Not Progressing   Problem: Activity: Goal: Interest or engagement in activities will improve Outcome: Not Progressing Goal: Sleeping patterns will improve Outcome: Not Progressing   Problem: Self-Concept: Goal: Will verbalize positive feelings about self Outcome: Not Progressing

## 2019-05-27 NOTE — ED Notes (Signed)
IVC/ Pending Inpt vs Placement

## 2019-05-28 DIAGNOSIS — F3163 Bipolar disorder, current episode mixed, severe, without psychotic features: Principal | ICD-10-CM

## 2019-05-28 DIAGNOSIS — F1210 Cannabis abuse, uncomplicated: Secondary | ICD-10-CM

## 2019-05-28 MED ORDER — OLANZAPINE 5 MG PO TBDP
15.0000 mg | ORAL_TABLET | Freq: Every day | ORAL | Status: DC
  Administered 2019-05-28 – 2019-05-29 (×2): 15 mg via ORAL
  Filled 2019-05-28 (×2): qty 3

## 2019-05-28 MED ORDER — OXYCODONE HCL 5 MG PO TABS
5.0000 mg | ORAL_TABLET | Freq: Three times a day (TID) | ORAL | Status: DC
  Administered 2019-05-28 – 2019-05-30 (×6): 5 mg via ORAL
  Filled 2019-05-28 (×7): qty 1

## 2019-05-28 MED ORDER — ENSURE ENLIVE PO LIQD
237.0000 mL | Freq: Two times a day (BID) | ORAL | Status: DC
  Administered 2019-05-28 – 2019-05-30 (×5): 237 mL via ORAL

## 2019-05-28 MED ORDER — OXYCODONE HCL 5 MG PO TABS
5.0000 mg | ORAL_TABLET | Freq: Once | ORAL | Status: AC
  Administered 2019-05-28: 5 mg via ORAL
  Filled 2019-05-28: qty 1

## 2019-05-28 MED ORDER — PROPRANOLOL HCL 20 MG PO TABS
10.0000 mg | ORAL_TABLET | Freq: Two times a day (BID) | ORAL | Status: DC
  Administered 2019-05-28 – 2019-05-30 (×4): 10 mg via ORAL
  Filled 2019-05-28 (×4): qty 1

## 2019-05-28 NOTE — BHH Counselor (Signed)
CSW spoke with Abigail(pt's APS worker [phone])) who reports APS filed a petition for legal guardianship on March 2020. She reports the petition is pending at this time due to Covid 19. Vernie Shanks reports a temporary protective order can be requested to assume temporary guardianship of the pt if a tentative discharge date/plan is provided. She states this order cannot be requested while the pt is in the hospital because she is not in immediate danger. CSW informed Vernie Shanks that the pt states she does not want to go to a group home but would like to return to her home. In addition, CSW informed her of bed availability at [facility]([address] [phone]).

## 2019-05-28 NOTE — BHH Group Notes (Signed)
LCSW Group Therapy Note  05/28/2019 1:00 PM  Type of Therapy/Topic:  Group Therapy:  Feelings about Diagnosis  Participation Level:  Active   Description of Group:   This group will allow patients to explore their thoughts and feelings about diagnoses they have received. Patients will be guided to explore their level of understanding and acceptance of these diagnoses. Facilitator will encourage patients to process their thoughts and feelings about the reactions of others to their diagnosis and will guide patients in identifying ways to discuss their diagnosis with significant others in their lives. This group will be process-oriented, with patients participating in exploration of their own experiences, giving and receiving support, and processing challenge from other group members.   Therapeutic Goals: 1. Patient will demonstrate understanding of diagnosis as evidenced by identifying two or more symptoms of the disorder 2. Patient will be able to express two feelings regarding the diagnosis 3. Patient will demonstrate their ability to communicate their needs through discussion and/or role play  Summary of Patient Progress: Patient was present and attentive in group.  Patient discussed primarily her medical diagnosis initially, however with redirection began to discuss mental health diagnoses.  Patient expressed frustration and confusion to her diagnosis initially and today, however, reported that several others have diagnosed with her similar.  Patient reports that her family has been supportive and helpful to her.  Patient was supportive to others.    Therapeutic Modalities:   Cognitive Behavioral Therapy Brief Therapy Feelings Identification   Assunta Curtis, MSW, LCSW 05/28/2019 2:19 PM

## 2019-05-28 NOTE — Plan of Care (Signed)
Patient is calm and well adjusted in the unit, compliance with her medications , without ant side effects , patient denies any SI/HI/AVH, patient expressed depressions at 4/10 scale but felt comfortable with her medications , sleep is sustained with out any distress, patient is socializing adequately with peers and participating in leisure time with peers , education and support is provided  patient responds well to information provided, 15 minutes safety checks is maintained.   Problem: Education: Goal: Knowledge of [place] [facility] Education information/materials will improve Outcome: Progressing Goal: Emotional status will improve Outcome: Progressing Goal: Mental status will improve Outcome: Progressing Goal: Verbalization of understanding the information provided will improve Outcome: Progressing   Problem: Activity: Goal: Interest or engagement in activities will improve Outcome: Progressing Goal: Sleeping patterns will improve Outcome: Progressing   Problem: Self-Concept: Goal: Will verbalize positive feelings about self Outcome: Progressing
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
## 2019-05-28 NOTE — BHH Suicide Risk Assessment (Signed)
[facility] Admission Suicide Risk Assessment   Nursing information obtained from:  Patient Demographic factors:  Living alone, Low socioeconomic status Current Mental Status:  Plan to harm others Loss Factors:  Financial problems / change in socioeconomic status Historical Factors:  Impulsivity Risk Reduction Factors:  Sense of responsibility to family  Total Time spent with patient: 1 hour Principal Problem: Bipolar 1 disorder, mixed, severe ([place]) Diagnosis:  Principal Problem:   Bipolar 1 disorder, mixed, severe ([place]) Active Problems:   Hypothyroidism   Opiate dependence ([place])   Cannabis abuse  Subjective Data: Patient seen and chart reviewed.  Patient denies suicidal or homicidal thought.  Denies any anger or impulsivity.  Denies any hallucinations paranoia or psychotic symptoms.  Claims to be continuing to take her prescription medicine as usual.  Admits to marijuana use.  Continued Clinical Symptoms:  Alcohol Use Disorder Identification Test Final Score (AUDIT): 0 The "Alcohol Use Disorders Identification Test", Guidelines for Use in Primary Care, Second Edition.  World Pharmacologist [facility]). Score between 0-7:  no or low risk or alcohol related problems. Score between 8-15:  moderate risk of alcohol related problems. Score between 16-19:  high risk of alcohol related problems. Score 20 or above:  warrants further diagnostic evaluation for alcohol dependence and treatment.   CLINICAL FACTORS:   Bipolar Disorder:   Mixed State Alcohol/Substance Abuse/Dependencies   Musculoskeletal: Strength & Muscle Tone: within normal limits Gait & Station: normal Patient leans: N/A  Psychiatric Specialty Exam: Physical Exam  Nursing note and vitals reviewed. Constitutional: She appears well-developed and well-nourished.  HENT:  Head: Normocephalic and atraumatic.  Eyes: Pupils are equal, round, and reactive to light. Conjunctivae are normal.  Neck: Normal range of motion.   Cardiovascular: Regular rhythm and normal heart sounds.  Respiratory: Effort normal.  GI: Soft.  Musculoskeletal: Normal range of motion.  Neurological: She is alert.  Skin: Skin is warm and dry.  Psychiatric: She has a normal mood and affect. Her behavior is normal. Judgment and thought content normal.    Review of Systems  Constitutional: Negative.   HENT: Negative.   Eyes: Negative.   Respiratory: Negative.   Cardiovascular: Negative.   Gastrointestinal: Negative.   Musculoskeletal: Negative.   Skin: Negative.   Neurological: Negative.   Psychiatric/Behavioral: Negative.     Blood pressure 128/87, pulse (!) 102, temperature 98.4 F (36.9 C), temperature source Oral, resp. rate 16, height 4\' 11"  (1.499 m), weight 42.6 kg, SpO2 100 %.Body mass index is 18.97 kg/m.  General Appearance: Casual  Eye Contact:  Good  Speech:  Clear and Coherent  Volume:  Normal  Mood:  Euthymic  Affect:  Congruent  Thought Process:  Goal Directed  Orientation:  Full (Time, Place, and Person)  Thought Content:  Logical  Suicidal Thoughts:  No  Homicidal Thoughts:  No  Memory:  Immediate;   Fair Recent;   Fair Remote;   Fair  Judgement:  Fair  Insight:  Fair  Psychomotor Activity:  Normal  Concentration:  Concentration: Fair  Recall:  [employer] of Knowledge:  Fair  Language:  Fair  Akathisia:  No  Handed:  Right  AIMS (if indicated):     Assets:  Desire for Improvement Housing Resilience  ADL's:  Intact  Cognition:  Impaired,  Mild  Sleep:  Number of Hours: 6      COGNITIVE FEATURES THAT CONTRIBUTE TO RISK:  Thought constriction (tunnel vision)    SUICIDE RISK:   Minimal: No identifiable suicidal ideation.  Patients presenting with no risk factors but with morbid ruminations; may be classified as minimal risk based on the severity of the depressive symptoms  PLAN OF CARE: Patient will be continued on her medication as previously ordered including the Zyprexa as primary  psychiatric medicine.  We have lowered the dose of her pain medicine.  She will be involved in groups and activities on the unit.  Suicidality will be assessed in an ongoing way before determining final disposition  I certify that inpatient services furnished can reasonably be expected to improve the patient's condition.   Alethia Berthold, MD 05/28/2019, 5:12 PM

## 2019-05-28 NOTE — Plan of Care (Signed)
  Problem: Self-Concept: Goal: Will verbalize positive feelings about self Outcome: Progressing  Patient verbalized feelings about herself, her thoughts about her health status and challenges.

## 2019-05-28 NOTE — Progress Notes (Signed)
CSW spoke with Leslie Wong [phone] to inquire if she still ahs a female bed available. Leslie Wong reported she does as long as pt is COVID-19 free. Leslie Wong requested FL2 be faxed to [phone].   CSW contacted Leslie Wong with APS at [phone] and asked the [facility] and a list of places they have already referred pt to be faxed over to [phone]. Leslie Wong reported she would fax it over and stay in contact about whether or not they have secured a placement for pt.  Leslie Wong, MSW, LCSW Clinical Social Work 05/28/2019 10:55 AM

## 2019-05-28 NOTE — Progress Notes (Signed)
Recreation Therapy Notes   Date: 05/28/2019  Time: 9:30 am  Location: Craft room  Behavioral response: Appropriate   Intervention Topic: Anger Management   Discussion/Intervention:  Group content on today was focused on anger management. The group defined anger and reasons they become angry. Individuals expressed negative way they have dealt with anger in the past. Patients stated some positive ways they could deal with anger in the future. The group described how anger can affect your health and daily plans. Individuals participated in the intervention "Score your anger" where they had a chance to answer questions about themselves and get a score of their anger.   Clinical Observations/Feedback:  Patient came to group late due to unknown reasons. Participant was focused on what peers and staff had to say about anger management. She stated she walks to help manage her anger. Individual was social with staff and peers while participating in the intervention. Brandom Kerwin LRT/CTRS         Jelani Vreeland 05/28/2019 10:51 AM

## 2019-05-28 NOTE — BHH Suicide Risk Assessment (Signed)
[place] INPATIENT:  Family/Significant Other Suicide Prevention Education  Suicide Prevention Education:  Education Completed; Leslie Wong, daughter [phone] has been identified by the patient as the family member/significant other with whom the patient will be residing, and identified as the person(s) who will aid the patient in the event of a mental health crisis (suicidal ideations/suicide attempt).  With written consent from the patient, the family member/significant other has been provided the following suicide prevention education, prior to the and/or following the discharge of the patient.  The suicide prevention education provided includes the following:  Suicide risk factors  Suicide prevention and interventions  National Suicide Hotline telephone number  [facility] assessment telephone number  [facility] Emergency Assistance [place] and/or Residential Mobile Crisis Unit telephone number  Request made of family/significant other to:  Remove weapons (e.g., guns, rifles, knives), all items previously/currently identified as safety concern.    Remove drugs/medications (over-the-counter, prescriptions, illicit drugs), all items previously/currently identified as a safety concern.  The family member/significant other verbalizes understanding of the suicide prevention education information provided.  The family member/significant other agrees to remove the items of safety concern listed above. Leslie Wong expressed concern about the pt living alone. She states the pt's PCP(Dr. Damita Dunnings) and psychiatrist(Dr. Orland Mustard) agree that it is not appropriate for her to live alone due to her displaying harmful behavior. Leslie Wong raised concern about the medication the pt has been prescribed when she has been hospitalized at [facility] and worries that it may be contributing to her parkinson's disease. She reports she does not want to assume legal guardianship for the pt but  is okay with being consulted before decisions are made about her care. She reports she prefers APS assume legal guardianship for the pt. She states the pt has expressed to her she does not want to go to a group home but she would prefer the pt be in a placement where she is safe and can be monitored. Leslie Wong 05/28/2019, 3:51 PM

## 2019-05-28 NOTE — Progress Notes (Signed)
Recreation Therapy Notes  INPATIENT RECREATION THERAPY ASSESSMENT  Patient Details Name: Leslie Wong MRN: [id number] DOB: [birth year] Today's Date: 05/28/2019       Information Obtained From: Patient  Able to Participate in Assessment/Interview: Yes  Patient Presentation: Responsive  Reason for Admission (Per Patient): Active Symptoms  Patient Stressors: Other (Comment)(Living situation)  Coping Skills:   Read, Talk, Prayer  Leisure Interests (2+):  Social - Family, Therapist, music - Flower gardening  Frequency of Recreation/Participation: Monthly  Awareness of Community Resources:     [employer]:     Current Use:    If no, Barriers?:    Expressed Interest in [place] of Residence:  Insurance underwriter  Patient Main Form of Transportation:    Patient Strengths:  Prayer  Patient Identified Areas of Improvement:  alot  Patient Goal for Hospitalization:  getting out  Current SI (including self-harm):  No  Current HI:  No  Current AVH: No  Staff Intervention Plan: Group Attendance, Collaborate with Interdisciplinary Treatment Team  Consent to Intern Participation: N/A  Bodhi Stenglein 05/28/2019, 4:07 PM

## 2019-05-28 NOTE — Plan of Care (Signed)
Patient aware of resources available .  Working on coping , decision anxiety and  skills . Limited participation with unit programing  .Interacting  with peer and staff  Able to verbalize feelings  Voice of no safety concerns  No anger management  or control issues . Patient  aware of information received on [place] Educations and unit programs .   Problem: Education: Goal: Knowledge of [place] [facility] Education information/materials will improve Outcome: Progressing Goal: Emotional status will improve Outcome: Progressing Goal: Mental status will improve Outcome: Progressing Goal: Verbalization of understanding the information provided will improve Outcome: Progressing   Problem: Activity: Goal: Interest or engagement in activities will improve Outcome: Progressing Goal: Sleeping patterns will improve Outcome: Progressing   Problem: Self-Concept: Goal: Will verbalize positive feelings about self Outcome: Progressing

## 2019-05-28 NOTE — Progress Notes (Signed)
D: Patient stated slept good last night .Stated appetite good and energy level   normal. Stated concentration good . Stated on Depression scale 2 , hopeless 0 and anxiety 6 .( low 0-10 high) Denies suicidal  homicidal ideations  . Denies auditory hallucinations  pain concerns with pinched nerve in neck  . Appropriate ADL'S. Interacting with peers and staffPatient aware of resources available .  Working on coping , decision anxiety and  skills . Limited participation with unit programing Interacting  with peer and staff  Able to verbalize feelings  Voice of no safety concerns  No anger management  or control issues . Patient  aware of information received on [place] Educations and unit programs . "Focus on medications  And go home " .  A: Encourage patient participation with unit programming . Instruction  Given on  Medication , verbalize understanding.  R: Voice no other concerns. Staff continue to monitor

## 2019-05-28 NOTE — H&P (Signed)
Psychiatric Admission Assessment Adult  Patient Identification: Norrine Ballester MRN:  [id number] Date of Evaluation:  05/28/2019 Chief Complaint:  Bipolar 1 Disorder Principal Diagnosis: Bipolar 1 disorder, mixed, severe ([place]) Diagnosis:  Principal Problem:   Bipolar 1 disorder, mixed, severe ([place]) Active Problems:   Hypothyroidism   Opiate dependence ([place])   Cannabis abuse  History of Present Illness: Patient seen and chart reviewed.  Patient known from previous encounters.  Patient has chronic mood instability multiple medical problems and substance abuse.  She was petitioned with reports that she had been fighting with her landlord and doing a poor job taking care of herself.  On interview today I found the patient neatly dressed and groomed.  Making good eye contact.  He states that she has been trying to clean up her apartment because she is told that she could lose it if she does not get it in better shape.  She denies having shouted at yelled at threatened or fought with her 77.  She says that she has chronic sleep problems as usual.  She says her mood though has been in her opinion fine.  She denies any suicidal or homicidal thought.  Denies any kind of psychotic symptoms.  Patient was alert and oriented x4.  Short and long-term memory were intact.  Patient claims that she has still been taking her prescription medicine the same as it was when she was here last time.  She admits to smoking marijuana on a daily basis.  Admits to having taken a Xanax at times and have continued to take her high-dose pain medicine but denies any other substance abuse.  Patient is strongly of the opinion that she does not need to be in the hospital.  Completely denies any suicidal or homicidal thoughts Associated Signs/Symptoms: Depression Symptoms:  anxiety, (Hypo) Manic Symptoms:  Distractibility, Impulsivity, Anxiety Symptoms:  Excessive Worry, Psychotic Symptoms:  None reported currently PTSD  Symptoms: Negative Total Time spent with patient: 1 hour  Past Psychiatric History: Patient has a long history of psychiatric disorder multiple hospitalizations.  Often comes into the hospital confused psychotic almost to the point of delirium.  Her mental illness is sometimes difficult to distinguish from the effects of her chronic use of high-dose opiates and other drugs.  Patient has appeared to at times have a very difficult time functioning outside the hospital.  She was just here in our hospital however about a month ago and was discharged and thought to be in reasonable condition at that point.  Is the patient at risk to self? No.  Has the patient been a risk to self in the past 6 months? Yes.    Has the patient been a risk to self within the distant past? Yes.    Is the patient a risk to others? No.  Has the patient been a risk to others in the past 6 months? No.  Has the patient been a risk to others within the distant past? No.   Prior Inpatient Therapy:   Prior Outpatient Therapy:    Alcohol Screening: 1. How often do you have a drink containing alcohol?: Never 2. How many drinks containing alcohol do you have on a typical day when you are drinking?: 1 or 2 3. How often do you have six or more drinks on one occasion?: Never AUDIT-C Score: 0 4. How often during the last year have you found that you were not able to stop drinking once you had started?: Never 5. How often  during the last year have you failed to do what was normally expected from you becasue of drinking?: Never 6. How often during the last year have you needed a first drink in the morning to get yourself going after a heavy drinking session?: Never 7. How often during the last year have you had a feeling of guilt of remorse after drinking?: Never 8. How often during the last year have you been unable to remember what happened the night before because you had been drinking?: Never 9. Have you or someone else been  injured as a result of your drinking?: No 10. Has a relative or friend or a doctor or another health worker been concerned about your drinking or suggested you cut down?: No Alcohol Use Disorder Identification Test Final Score (AUDIT): 0 Alcohol Brief Interventions/Follow-up: AUDIT Score <7 follow-up not indicated Substance Abuse History in the last 12 months:  Yes.   Consequences of Substance Abuse: Medical Consequences:  I think her substance abuse with her marijuana and also with her I iatrogenically created opiate dependence causes her to have chronic cognitive impairment and poor functioning Previous Psychotropic Medications: Yes  Psychological Evaluations: Yes  Past Medical History:  Past Medical History:  Diagnosis Date  . Back pain   . Cancer ([place])    skin  . Depression    BAD  . Gastroparesis   . GERD (gastroesophageal reflux disease)   . Hypothyroidism   . Insomnia   . Migraines   . Parkinson disease ([place])    per [place] [facility], dx'd 2019  . Recurrent cold sores   . Shoulder bursitis   . Urge incontinence     Past Surgical History:  Procedure Laterality Date  . ABDOMINAL HYSTERECTOMY  1999   total  . APPENDECTOMY    . ESOPHAGOGASTRODUODENOSCOPY  01/2006   negative except small hiatal hernia  . ESOPHAGOGASTRODUODENOSCOPY  02/24/2015   See report  . LAPAROSCOPY     for endometriosis  . OVARIAN CYST REMOVAL  1990   unilateral  . TONSILLECTOMY AND ADENOIDECTOMY     Family History:  Family History  Problem Relation Age of Onset  . Hypertension Mother   . Arthritis Mother   . Diabetes Mother   . Stroke Mother   . Cancer Father        lung  . Colon cancer Neg Hx   . Breast cancer Neg Hx    Family Psychiatric  History: None reported Tobacco Screening: Have you used any form of tobacco in the last 30 days? (Cigarettes, Smokeless Tobacco, Cigars, and/or Pipes): Yes Tobacco use, Select all that apply: 5 or more cigarettes per day Are you interested in Tobacco  Cessation Medications?: Yes, will notify MD for an order Counseled patient on smoking cessation including recognizing danger situations, developing coping skills and basic information about quitting provided: Yes Social History:  Social History   Substance and Sexual Activity  Alcohol Use No  . Alcohol/week: 0.0 standard drinks     Social History   Substance and Sexual Activity  Drug Use No    Additional Social History:                           Allergies:   Allergies  Allergen Reactions  . Cymbalta [Duloxetine Hcl]   . Valproic Acid Shortness Of Breath  . Aspirin     REACTION: UNSPECIFIED  . Atorvastatin Other (See Comments)    Tongue swelling, legs cramping  .  Baclofen     REACTION: Throat swells up  . Codeine     REACTION: UNSPECIFIED  . Duloxetine Other (See Comments)    Vision loss, weight loss  . Erythromycin     REACTION: UNSPECIFIED  . Gabapentin     REACTION: throat swells up  . Metoclopramide Hcl     REACTION: states "messed me up"  . Nortriptyline     Other reaction(s): Other (See Comments) Vision issues  . Nsaids Other (See Comments)    Stomach problems  . Nucynta Er [Tapentadol Hcl Er]     Intolerant, see note from 07/24/12.    Gean [place]ch[place] [Tapentadol]     AMS  . Prednisone     GI intolance  . Pregabalin   . Seroquel [Quetiapine Fumerate] Other (See Comments)    Loss control, felt like on another planet  . Sulfa Antibiotics Nausea And Vomiting  . Topamax Other (See Comments)    Blisters in mouth and tongue  . Vicodin [Hydrocodone-Acetaminophen]     Nausea, thrush and constipation   Lab Results:  Results for orders placed or performed during the hospital encounter of 05/24/19 (from the past 48 hour(s))  SARS Coronavirus 2 (CEPHEID - Performed in [facility] hospital lab), Hosp Order     Status: None   Collection Time: 05/27/19  2:06 PM  Result Value Ref Range   SARS Coronavirus 2 NEGATIVE NEGATIVE    Comment: (NOTE) If result is  NEGATIVE SARS-CoV-2 target nucleic acids are NOT DETECTED. The SARS-CoV-2 RNA is generally detectable in upper and lower  respiratory specimens during the acute phase of infection. The lowest  concentration of SARS-CoV-2 viral copies this assay can detect is 250  copies / mL. A negative result does not preclude SARS-CoV-2 infection  and should not be used as the sole basis for treatment or other  patient management decisions.  A negative result may occur with  improper specimen collection / handling, submission of specimen other  than nasopharyngeal swab, presence of viral mutation(s) within the  areas targeted by this assay, and inadequate number of viral copies  (<250 copies / mL). A negative result must be combined with clinical  observations, patient history, and epidemiological information. If result is POSITIVE SARS-CoV-2 target nucleic acids are DETECTED. The SARS-CoV-2 RNA is generally detectable in upper and lower  respiratory specimens dur ing the acute phase of infection.  Positive  results are indicative of active infection with SARS-CoV-2.  Clinical  correlation with patient history and other diagnostic information is  necessary to determine patient infection status.  Positive results do  not rule out bacterial infection or co-infection with other viruses. If result is PRESUMPTIVE POSTIVE SARS-CoV-2 nucleic acids MAY BE PRESENT.   A presumptive positive result was obtained on the submitted specimen  and confirmed on repeat testing.  While 2019 novel coronavirus  (SARS-CoV-2) nucleic acids may be present in the submitted sample  additional confirmatory testing may be necessary for epidemiological  and / or clinical management purposes  to differentiate between  SARS-CoV-2 and other Sarbecovirus currently known to infect humans.  If clinically indicated additional testing with an alternate test  methodology [phone]) is advised. The SARS-CoV-2 RNA is generally  detectable  in upper and lower respiratory sp ecimens during the acute  phase of infection. The expected result is Negative. Fact Sheet for Patients:  StrictlyIdeas.no Fact Sheet for Healthcare Providers: BankingDealers.co.za This test is not yet approved or cleared by the [place] FDA and has  been authorized for detection and/or diagnosis of SARS-CoV-2 by FDA under an Emergency Use Authorization (EUA).  This EUA will remain in effect (meaning this test can be used) for the duration of the COVID-19 declaration under Section 564(b)(1) of the Act, 21 U.S.C. section 360bbb-3(b)(1), unless the authorization is terminated or revoked sooner. Performed at [facility], [address] [place], [place] [address]     Blood Alcohol level:  Lab Results  Component Value Date   [facility] <10 05/24/2019   ETH <10 26/94/8546    Metabolic Disorder Labs:  Lab Results  Component Value Date   HGBA1C 5.4 10/13/2018   MPG 108.28 10/13/2018   MPG 111.15 08/29/2018   No results found for: PROLACTIN Lab Results  Component Value Date   CHOL 168 10/13/2018   TRIG 130 10/13/2018   HDL 39 (L) 10/13/2018   CHOLHDL 4.3 10/13/2018   VLDL 26 10/13/2018   LDLCALC 103 (H) 10/13/2018   LDLCALC 107 (H) 08/29/2018    Current Medications: Current Facility-Administered Medications  Medication Dose Route Frequency Provider Last Rate Last Dose  . acetaminophen (TYLENOL) tablet 650 mg  650 mg Oral Q6H PRN Lavella Hammock, MD      . albuterol (VENTOLIN HFA) 108 (90 Base) MCG/ACT inhaler 2 puff  2 puff Inhalation Q6H PRN Lavella Hammock, MD      . alum & mag hydroxide-simeth (MAALOX/MYLANTA) 200-200-20 MG/5ML suspension 30 mL  30 mL Oral Q4H PRN Lavella Hammock, MD      . cholecalciferol (VITAMIN D) tablet 1,000 Units  1,000 Units Oral Daily Lavella Hammock, MD   1,000 Units at 05/28/19 [phone]  . hydrocerin (EUCERIN) cream 1 application  1 application Topical BID  Lavella Hammock, MD   1 application at 50/09/38 1051  . hydrOXYzine (ATARAX/VISTARIL) tablet 50 mg  50 mg Oral TID PRN Lavella Hammock, MD   50 mg at 05/28/19 1058  . lamoTRIgine (LAMICTAL) tablet 50 mg  50 mg Oral Daily Lavella Hammock, MD   50 mg at 05/28/19 0806  . levothyroxine (SYNTHROID) tablet 88 mcg  88 mcg Oral QAC breakfast Lavella Hammock, MD      . magnesium hydroxide (MILK OF MAGNESIA) suspension 30 mL  30 mL Oral Daily PRN Lavella Hammock, MD      . multivitamin with minerals tablet 1 tablet  1 tablet Oral Daily Lavella Hammock, MD   1 tablet at 05/28/19 [phone]  . OLANZapine zydis (ZYPREXA) disintegrating tablet 15 mg  15 mg Oral QHS Beonca Gibb T, MD      . omega-3 acid ethyl esters (LOVAZA) capsule 1 g  1 g Oral Daily Lavella Hammock, MD   1 g at 05/28/19 0810  . ondansetron (ZOFRAN-ODT) disintegrating tablet 4 mg  4 mg Oral TID PRN Lavella Hammock, MD   4 mg at 05/28/19 0239  . oxyCODONE (Oxy IR/ROXICODONE) immediate release tablet 5 mg  5 mg Oral TID Padraig Nhan T, MD      . pantoprazole (PROTONIX) EC tablet 40 mg  40 mg Oral Daily Lavella Hammock, MD   40 mg at 05/28/19 0810  . propranolol (INDERAL) tablet 10 mg  10 mg Oral BID Chadley Dziedzic, [name], MD      . senna-docusate (Senokot-S) tablet 2 tablet  2 tablet Oral Daily Lavella Hammock, MD   2 tablet at 05/28/19 [phone]  . valACYclovir (VALTREX) tablet 500 mg  500 mg Oral BID Leverne Humbles,  Guadalupe Maple, MD   500 mg at 05/28/19 1052  . vitamin B-12 (CYANOCOBALAMIN) tablet 1,000 mcg  1,000 mcg Oral Daily Lavella Hammock, MD   1,000 mcg at 05/28/19 6269  . vitamin C (ASCORBIC ACID) tablet 500 mg  500 mg Oral Daily Lavella Hammock, MD   500 mg at 05/28/19 1052   PTA Medications: Medications Prior to Admission  Medication Sig Dispense Refill Last Dose  . albuterol (VENTOLIN HFA) 108 (90 Base) MCG/ACT inhaler Inhale 2 puffs into the lungs every 6 (six) hours as needed for wheezing or shortness of breath. (Patient not taking: Reported  on 05/03/2019) 1 Inhaler 1 Not Taking  . ascorbic acid (VITAMIN C) 500 MG tablet Take 500 mg by mouth daily.   Past Week at Unknown time  . cholecalciferol (VITAMIN D) 1000 units tablet Take 1 tablet (1,000 Units total) by mouth daily.   Past Week at Unknown time  . hydrocerin (EUCERIN) CREA Apply 1 application topically 2 (two) times daily. 454 g 0 prn at prn  . hydrOXYzine (ATARAX/VISTARIL) 50 MG tablet Take 1 tablet (50 mg total) by mouth 3 (three) times daily as needed for itching or anxiety. 30 tablet 0 Past Week at Unknown time  . lamoTRIgine (LAMICTAL) 25 MG tablet Take 2 tablets (50 mg total) by mouth daily. 60 tablet 0 Past Week at Unknown time  . levothyroxine (SYNTHROID, LEVOTHROID) 88 MCG tablet Take 1 tablet (88 mcg total) by mouth daily. 90 tablet 1 Past Week at Unknown time  . Multiple Vitamin (MULTIVITAMIN) tablet Take 1 tablet by mouth daily.     Past Week at Unknown time  . olanzapine zydis (ZYPREXA) 15 MG disintegrating tablet Take 1 tablet (15 mg total) by mouth at bedtime. 30 tablet 0 Past Week at Unknown time  . Omega-3 Fatty Acids (FISH OIL) 1000 MG CAPS Take 1 capsule (1,000 mg total) by mouth daily.   Past Week at Unknown time  . omeprazole (PRILOSEC) 20 MG capsule TAKE 1 CAPSULE BY MOUTH EVERY MORNING 45 MINUTES BEFORE BREAKFAST (Patient taking differently: Take 20 mg by mouth daily. ) 90 capsule 1 Past Week at Unknown time  . ondansetron (ZOFRAN-ODT) 4 MG disintegrating tablet Take 1 tablet (4 mg total) by mouth 3 (three) times daily as needed for nausea or vomiting. 20 tablet 1 prn at prn  . oxyCODONE (ROXICODONE) 5 MG immediate release tablet Take 1 tablet (5 mg total) by mouth every 8 (eight) hours as needed. (Patient not taking: Reported on 05/27/2019) 5 tablet 0 Completed Course at Unknown time  . senna-docusate (SENOKOT-S) 8.6-50 MG tablet Take 2 tablets by mouth daily.   prn at prn  . tiZANidine (ZANAFLEX) 4 MG tablet Take 1 tablet (4 mg total) by mouth 3 (three) times  daily. 90 tablet 1 prn at prn  . triamcinolone cream (KENALOG) 0.1 % Apply 1 application topically 2 (two) times daily. 30 g 0 prn at prn  . valACYclovir (VALTREX) 500 MG tablet TAKE 1 TABLET(500 MG) BY MOUTH TWICE DAILY AS NEEDED (Patient taking differently: Take 500 mg by mouth 2 (two) times daily. ) 60 tablet 0 prn at prn  . vitamin B-12 (CYANOCOBALAMIN) 1000 MCG tablet Take 1,000 mcg by mouth daily.   Past Week at Unknown time    Musculoskeletal: Strength & Muscle Tone: within normal limits Gait & Station: normal Patient leans: N/A  Psychiatric Specialty Exam: Physical Exam  Nursing note and vitals reviewed. Constitutional: She appears well-developed and well-nourished.  HENT:  Head: Normocephalic and atraumatic.  Eyes: Pupils are equal, round, and reactive to light. Conjunctivae are normal.  Neck: Normal range of motion.  Cardiovascular: Regular rhythm and normal heart sounds.  Respiratory: Effort normal. No respiratory distress.  GI: Soft.  Musculoskeletal: Normal range of motion.  Neurological: She is alert.  Skin: Skin is warm and dry.  Psychiatric: She has a normal mood and affect. Her speech is normal and behavior is normal. Thought content normal. She expresses impulsivity. She exhibits abnormal recent memory.    Review of Systems  Constitutional: Negative.   HENT: Negative.   Eyes: Negative.   Respiratory: Negative.   Cardiovascular: Negative.   Gastrointestinal: Negative.   Musculoskeletal: Negative.   Skin: Negative.   Neurological: Negative.   Psychiatric/Behavioral: Positive for substance abuse. Negative for depression, hallucinations, memory loss and suicidal ideas. The patient is nervous/anxious. The patient does not have insomnia.     Blood pressure 128/87, pulse (!) 102, temperature 98.4 F (36.9 C), temperature source Oral, resp. rate 16, height 4\' 11"  (1.499 m), weight 42.6 kg, SpO2 100 %.Body mass index is 18.97 kg/m.  General Appearance: Casual  Eye  Contact:  Good  Speech:  Clear and Coherent  Volume:  Normal  Mood:  Euthymic  Affect:  Congruent  Thought Process:  Goal Directed  Orientation:  Full (Time, Place, and Person)  Thought Content:  Logical  Suicidal Thoughts:  No  Homicidal Thoughts:  No  Memory:  Immediate;   Fair Recent;   Poor Remote;   Fair  Judgement:  Impaired  Insight:  Shallow  Psychomotor Activity:  Normal  Concentration:  Concentration: Fair  Recall:  [employer] of Knowledge:  Fair  Language:  Fair  Akathisia:  No  Handed:  Right  AIMS (if indicated):     Assets:  Desire for Improvement Housing Resilience  ADL's:  Intact  Cognition:  Impaired,  Mild  Sleep:  Number of Hours: 6    Treatment Plan Summary: Daily contact with patient to assess and evaluate symptoms and progress in treatment, Medication management and Plan Patient with well-known chronic mental health problems with frequent presentation with mixed states and psychosis is committed this time with allegations of fighting with her landlord and having driven her automobile when she had been told not to do so.  To my assessment the patient actually seems to be in better shape mentally than I have seen her in a long time.  She was alert and oriented she was calm she was not delusional paranoid or disorganized in her thinking.  It is my understanding that she has been petitioned here with the understanding that Adult Protective Services is attempting to get guardianship for her so that she can be forcibly placed in a living facility.  Patient was not aware of this.  She does not want to go to any other living facility.  I am not going to change any of her medicines for right now but I am going to keep her on the lower dose of oxycodone that was ordered on admission.  Patient is getting by on half of what she normally takes at home a fact which I pointed out to her repeatedly.  Treatment team will meet tomorrow and we will discuss disposition and further  treatment options.  Observation Level/Precautions:  15 minute checks  Laboratory:  Chemistry Profile  Psychotherapy:    Medications:    Consultations:    Discharge Concerns:    Estimated LOS:  Other:  Physician Treatment Plan for Primary Diagnosis: Bipolar 1 disorder, mixed, severe ([place]) Long Term Goal(s): Improvement in symptoms so as ready for discharge  Short Term Goals: Ability to verbalize feelings will improve and Ability to demonstrate self-control will improve  Physician Treatment Plan for Secondary Diagnosis: Principal Problem:   Bipolar 1 disorder, mixed, severe ([place]) Active Problems:   Hypothyroidism   Opiate dependence ([place])   Cannabis abuse  Long Term Goal(s): Improvement in symptoms so as ready for discharge  Short Term Goals: Ability to identify triggers associated with substance abuse/mental health issues will improve  I certify that inpatient services furnished can reasonably be expected to improve the patient's condition.    Alethia Berthold, MD 6/2/20205:04 PM

## 2019-05-29 MED ORDER — TUBERCULIN PPD 5 UNIT/0.1ML ID SOLN
5.0000 [IU] | Freq: Once | INTRADERMAL | Status: DC
  Administered 2019-05-29: 17:00:00 5 [IU] via INTRADERMAL
  Filled 2019-05-29: qty 0.1

## 2019-05-29 NOTE — [facility] Group Notes (Signed)
LCSW Group Therapy Note  05/29/2019 12:26 PM  Type of Therapy/Topic:  Group Therapy:  Emotion Regulation  Participation Level:  Minimal   Description of Group:   The purpose of this group is to assist patients in learning to regulate negative emotions and experience positive emotions. Patients will be guided to discuss ways in which they have been vulnerable to their negative emotions. These vulnerabilities will be juxtaposed with experiences of positive emotions or situations, and patients will be challenged to use positive emotions to combat negative ones. Special emphasis will be placed on coping with negative emotions in conflict situations, and patients will process healthy conflict resolution skills.  Therapeutic Goals: 1. Patient will identify two positive emotions or experiences to reflect on in order to balance out negative emotions 2. Patient will label two or more emotions that they find the most difficult to experience 3. Patient will demonstrate positive conflict resolution skills through discussion and/or role plays  Summary of Patient Progress: Pt was present in group. Pt came to group towards the end and reported that some coping strategies she likes to use are praying, talking with friends and shopping.    Therapeutic Modalities:   Cognitive Behavioral Therapy Feelings Identification Dialectical Behavioral Therapy   Evalina Field, MSW, LCSW Clinical Social Work 05/29/2019 12:26 PM

## 2019-05-29 NOTE — Plan of Care (Signed)
Patient aware of resources available .  Working on coping , decision anxiety and  skills . Limited participation with unit programing  .Interacting  with peer and staff  Able to verbalize feelings  Voice of no safety concerns  No anger management  or control issues . Patient  aware of information received on [place] Educations and unit programs .    Problem: Education: Goal: Knowledge of [place] [facility] Education information/materials will improve Outcome: Progressing Goal: Emotional status will improve Outcome: Progressing Goal: Mental status will improve Outcome: Progressing Goal: Verbalization of understanding the information provided will improve Outcome: Progressing   Problem: Activity: Goal: Interest or engagement in activities will improve Outcome: Progressing Goal: Sleeping patterns will improve Outcome: Progressing

## 2019-05-29 NOTE — Tx Team (Addendum)
Interdisciplinary Treatment and Diagnostic Plan Update  05/29/2019 Time of Session: 230pm Leslie Wong MRN: [id number]  Principal Diagnosis: Bipolar 1 disorder, mixed, severe ([place])  Secondary Diagnoses: Principal Problem:   Bipolar 1 disorder, mixed, severe ([place]) Active Problems:   Hypothyroidism   Opiate dependence ([place])   Cannabis abuse   Current Medications:  Current Facility-Administered Medications  Medication Dose Route Frequency Provider Last Rate Last Dose  . acetaminophen (TYLENOL) tablet 650 mg  650 mg Oral Q6H PRN Lavella Hammock, MD      . albuterol (VENTOLIN HFA) 108 (90 Base) MCG/ACT inhaler 2 puff  2 puff Inhalation Q6H PRN Lavella Hammock, MD      . alum & mag hydroxide-simeth (MAALOX/MYLANTA) 200-200-20 MG/5ML suspension 30 mL  30 mL Oral Q4H PRN Lavella Hammock, MD   30 mL at 05/28/19 2148  . cholecalciferol (VITAMIN D) tablet 1,000 Units  1,000 Units Oral Daily Lavella Hammock, MD   1,000 Units at 05/29/19 0747  . feeding supplement (ENSURE ENLIVE) (ENSURE ENLIVE) liquid 237 mL  237 mL Oral BID BM [name], [name], MD   237 mL at 05/29/19 1400  . hydrocerin (EUCERIN) cream 1 application  1 application Topical BID Lavella Hammock, MD   1 application at 61/60/73 [phone]  . hydrOXYzine (ATARAX/VISTARIL) tablet 50 mg  50 mg Oral TID PRN Lavella Hammock, MD   50 mg at 05/29/19 1521  . lamoTRIgine (LAMICTAL) tablet 50 mg  50 mg Oral Daily Lavella Hammock, MD   50 mg at 05/29/19 0747  . levothyroxine (SYNTHROID) tablet 88 mcg  88 mcg Oral QAC breakfast Lavella Hammock, MD   88 mcg at 05/29/19 [phone]  . magnesium hydroxide (MILK OF MAGNESIA) suspension 30 mL  30 mL Oral Daily PRN Lavella Hammock, MD      . multivitamin with minerals tablet 1 tablet  1 tablet Oral Daily Lavella Hammock, MD   1 tablet at 05/29/19 0747  . OLANZapine zydis (ZYPREXA) disintegrating tablet 15 mg  15 mg Oral QHS [name], [name], MD   15 mg at 05/28/19 2214  . omega-3 acid ethyl esters  (LOVAZA) capsule 1 g  1 g Oral Daily Lavella Hammock, MD   1 g at 05/29/19 0758  . ondansetron (ZOFRAN-ODT) disintegrating tablet 4 mg  4 mg Oral TID PRN Lavella Hammock, MD   4 mg at 05/28/19 0239  . oxyCODONE (Oxy IR/ROXICODONE) immediate release tablet 5 mg  5 mg Oral TID [name], [name], MD   5 mg at 05/29/19 1213  . pantoprazole (PROTONIX) EC tablet 40 mg  40 mg Oral Daily Lavella Hammock, MD   40 mg at 05/29/19 0646  . propranolol (INDERAL) tablet 10 mg  10 mg Oral BID [name], [name], MD   10 mg at 05/29/19 0757  . senna-docusate (Senokot-S) tablet 2 tablet  2 tablet Oral Daily Lavella Hammock, MD   2 tablet at 05/29/19 [phone]  . tuberculin injection 5 Units  5 Units Intradermal Once [name], [name], MD      . valACYclovir (VALTREX) tablet 500 mg  500 mg Oral BID Lavella Hammock, MD   500 mg at 05/29/19 0756  . vitamin B-12 (CYANOCOBALAMIN) tablet 1,000 mcg  1,000 mcg Oral Daily Lavella Hammock, MD   1,000 mcg at 05/29/19 0747  . vitamin C (ASCORBIC ACID) tablet 500 mg  500 mg Oral Daily Lavella Hammock, MD  500 mg at 05/29/19 0757   PTA Medications: Medications Prior to Admission  Medication Sig Dispense Refill Last Dose  . albuterol (VENTOLIN HFA) 108 (90 Base) MCG/ACT inhaler Inhale 2 puffs into the lungs every 6 (six) hours as needed for wheezing or shortness of breath. (Patient not taking: Reported on 05/03/2019) 1 Inhaler 1 Not Taking  . ascorbic acid (VITAMIN C) 500 MG tablet Take 500 mg by mouth daily.   Past Week at Unknown time  . cholecalciferol (VITAMIN D) 1000 units tablet Take 1 tablet (1,000 Units total) by mouth daily.   Past Week at Unknown time  . hydrocerin (EUCERIN) CREA Apply 1 application topically 2 (two) times daily. 454 g 0 prn at prn  . hydrOXYzine (ATARAX/VISTARIL) 50 MG tablet Take 1 tablet (50 mg total) by mouth 3 (three) times daily as needed for itching or anxiety. 30 tablet 0 Past Week at Unknown time  . lamoTRIgine (LAMICTAL) 25 MG tablet Take 2 tablets  (50 mg total) by mouth daily. 60 tablet 0 Past Week at Unknown time  . levothyroxine (SYNTHROID, LEVOTHROID) 88 MCG tablet Take 1 tablet (88 mcg total) by mouth daily. 90 tablet 1 Past Week at Unknown time  . Multiple Vitamin (MULTIVITAMIN) tablet Take 1 tablet by mouth daily.     Past Week at Unknown time  . olanzapine zydis (ZYPREXA) 15 MG disintegrating tablet Take 1 tablet (15 mg total) by mouth at bedtime. 30 tablet 0 Past Week at Unknown time  . Omega-3 Fatty Acids (FISH OIL) 1000 MG CAPS Take 1 capsule (1,000 mg total) by mouth daily.   Past Week at Unknown time  . omeprazole (PRILOSEC) 20 MG capsule TAKE 1 CAPSULE BY MOUTH EVERY MORNING 45 MINUTES BEFORE BREAKFAST (Patient taking differently: Take 20 mg by mouth daily. ) 90 capsule 1 Past Week at Unknown time  . ondansetron (ZOFRAN-ODT) 4 MG disintegrating tablet Take 1 tablet (4 mg total) by mouth 3 (three) times daily as needed for nausea or vomiting. 20 tablet 1 prn at prn  . oxyCODONE (ROXICODONE) 5 MG immediate release tablet Take 1 tablet (5 mg total) by mouth every 8 (eight) hours as needed. (Patient not taking: Reported on 05/27/2019) 5 tablet 0 Completed Course at Unknown time  . senna-docusate (SENOKOT-S) 8.6-50 MG tablet Take 2 tablets by mouth daily.   prn at prn  . tiZANidine (ZANAFLEX) 4 MG tablet Take 1 tablet (4 mg total) by mouth 3 (three) times daily. 90 tablet 1 prn at prn  . triamcinolone cream (KENALOG) 0.1 % Apply 1 application topically 2 (two) times daily. 30 g 0 prn at prn  . valACYclovir (VALTREX) 500 MG tablet TAKE 1 TABLET(500 MG) BY MOUTH TWICE DAILY AS NEEDED (Patient taking differently: Take 500 mg by mouth 2 (two) times daily. ) 60 tablet 0 prn at prn  . vitamin B-12 (CYANOCOBALAMIN) 1000 MCG tablet Take 1,000 mcg by mouth daily.   Past Week at Unknown time    Patient Stressors: Health problems Medication change or noncompliance Substance abuse  Patient Strengths: Ability for insight Capable of independent  living  Treatment Modalities: Medication Management, Group therapy, Case management,  1 to 1 session with clinician, Psychoeducation, Recreational therapy.   Physician Treatment Plan for Primary Diagnosis: Bipolar 1 disorder, mixed, severe ([place]) Long Term Goal(s): Improvement in symptoms so as ready for discharge Improvement in symptoms so as ready for discharge   Short Term Goals: Ability to verbalize feelings will improve Ability to demonstrate self-control will improve  Ability to identify triggers associated with substance abuse/mental health issues will improve  Medication Management: Evaluate patient's response, side effects, and tolerance of medication regimen.  Therapeutic Interventions: 1 to 1 sessions, Unit Group sessions and Medication administration.  Evaluation of Outcomes: Progressing  Physician Treatment Plan for Secondary Diagnosis: Principal Problem:   Bipolar 1 disorder, mixed, severe ([place]) Active Problems:   Hypothyroidism   Opiate dependence ([place])   Cannabis abuse  Long Term Goal(s): Improvement in symptoms so as ready for discharge Improvement in symptoms so as ready for discharge   Short Term Goals: Ability to verbalize feelings will improve Ability to demonstrate self-control will improve Ability to identify triggers associated with substance abuse/mental health issues will improve     Medication Management: Evaluate patient's response, side effects, and tolerance of medication regimen.  Therapeutic Interventions: 1 to 1 sessions, Unit Group sessions and Medication administration.  Evaluation of Outcomes: Progressing   RN Treatment Plan for Primary Diagnosis: Bipolar 1 disorder, mixed, severe ([place]) Long Term Goal(s): Knowledge of disease and therapeutic regimen to maintain health will improve  Short Term Goals: Ability to participate in decision making will improve, Ability to disclose and discuss suicidal ideas, Ability to identify and develop  effective coping behaviors will improve and Compliance with prescribed medications will improve  Medication Management: RN will administer medications as ordered by provider, will assess and evaluate patient's response and provide education to patient for prescribed medication. RN will report any adverse and/or side effects to prescribing provider.  Therapeutic Interventions: 1 on 1 counseling sessions, Psychoeducation, Medication administration, Evaluate responses to treatment, Monitor vital signs and CBGs as ordered, Perform/monitor CIWA, COWS, AIMS and Fall Risk screenings as ordered, Perform wound care treatments as ordered.  Evaluation of Outcomes: Progressing   LCSW Treatment Plan for Primary Diagnosis: Bipolar 1 disorder, mixed, severe ([place]) Long Term Goal(s): Safe transition to appropriate next level of care at discharge, Engage patient in therapeutic group addressing interpersonal concerns.  Short Term Goals: Engage patient in aftercare planning with referrals and resources  Therapeutic Interventions: Assess for all discharge needs, 1 to 1 time with Social worker, Explore available resources and support systems, Assess for adequacy in community support network, Educate family and significant other(s) on suicide prevention, Complete Psychosocial Assessment, Interpersonal group therapy.  Evaluation of Outcomes: Progressing   Progress in Treatment: Attending groups: Yes. Participating in groups: Yes. Taking medication as prescribed: Yes. Toleration medication: Yes. Family/Significant other contact made: Yes, individual(s) contacted:  Clarita Leber, daughter Patient understands diagnosis: No. Discussing patient identified problems/goals with staff: Yes. Medical problems stabilized or resolved: Yes. Denies suicidal/homicidal ideation: Yes. Issues/concerns per patient self-inventory: No. Other: NA  New problem(s) identified: No, Describe:  none reported  New Short Term/Long Term  Goal(s):Attend outpatient treatment, take medication as prescribed, develop and implement healthy coping methods to manage life stressors  Patient Goals:  "Go home, I have a lot of stuff  I need to do"  Discharge Plan or Barriers: Pt is scheduled to discharge on 6/4 into APS custody. APS has suggested that the pt be referred to a group home. [facility] has accepted the pt and will pick her up tomorrow.  Reason for Continuation of Hospitalization: Medication stabilization  Estimated Length of Stay: D/C on 05/29/19  Recreational Therapy: Patient Stressors: N/A Patient Goal: Patient will engage in groups without prompting or encouragement from LRT x3 group sessions within 5 recreation therapy group sessions   Attendees: Patient:Loralye Gwinn 05/29/2019 3:27 PM  Physician: Alethia Berthold  05/29/2019 3:27 PM  Nursing: Polly Cobia 05/29/2019 3:27 PM  RN Care Manager: 05/29/2019 3:27 PM  Social Worker: Sanjuana Kava [facility] Assunta Curtis 05/29/2019 3:27 PM  Recreational Therapist: Roanna Epley 05/29/2019 3:27 PM  Other:  05/29/2019 3:27 PM  Other:  05/29/2019 3:27 PM  Other: 05/29/2019 3:27 PM    Scribe for Treatment Team: Yvette Rack, LCSW 05/29/2019 3:27 PM

## 2019-05-29 NOTE — Progress Notes (Signed)
D: Patient stated slept good last night .Stated appetite is good and energy level  Is normal. Stated concentration is good . Stated on Depression scale , hopeless and anxiety .( low 0-10 high) Denies suicidal  homicidal ideations  .  No auditory hallucinations  No pain concerns . Appropriate ADL'S. Interacting with peers and staff. Patient aware of resources available . Working on [employer] . Limited participation with unit programing .Interacting  with peer and staff Able to verbalize feelingsVoice of no safety concerns No anger management or control issues .Patient aware of information received on [facility] and unit programs . Patient informed during treatment  Team that Adult Protective Service will not  Allow  her to return to her home and she will now be placed in a group home . Patient got very upset cried and argued with staff . Staff informed patient this is not the responsibility  Of  Staff . Patient  Made a phone call to  [facility] to APS. Again patient arguing on the phone to     A: Encourage patient participation with unit programming . Instruction  Given on  Medication , verbalize understanding.  R: Voice no other concerns. Staff continue to monitor

## 2019-05-29 NOTE — Progress Notes (Signed)
Recreation Therapy Notes    Date: 05/29/2019  Time: 9:30 am  Location: Craft room  Behavioral response: Appropriate    Intervention Topic: Wellness  Discussion/Intervention:  Group content on today was focused on anger management. The group defined anger and reasons they become angry. Individuals expressed negative way they have dealt with anger in the past. Patients stated some positive ways they could deal with anger in the future. The group described how anger can affect your health and daily plans. Individuals participated in the intervention "Score your anger" where they had a chance to answer questions about themselves and get a score of their anger.   Clinical Observations/Feedback:  Patient came to group and was focused on what peers and staff had to say about wellness. Individual was social with peers and staff while participating in group.   Martyn Timme LRT/CTRS          Betzabe Bevans 05/29/2019 11:12 AM

## 2019-05-29 NOTE — Progress Notes (Signed)
[facility] MD Progress Note  05/29/2019 4:06 PM Leslie Wong  MRN:  [id number] Subjective: Patient seen and chart reviewed.  Patient also attended treatment team today.  Initially the patient had no new complaints.  Says that her mood is feeling fine.  Denies feeling depressed.  Denies any hallucinations.  Patient is alert and oriented x4.  She has been taking care of her ADLs and her basic hygiene appropriately.  She is participating on the unit and interacting with other patients without any difficulty.  She was requesting discharge today as I had discussed with her yesterday.  During treatment team I thought it was fair to be honest with her.  I told her the results of the phone conversation that I had with the representative of Adult Protective Services.  They are intending to obtain a emergency protective order for the patient so that she can be placed in a group home.  Patient was very upset about this.  Argued at great length.  I think she finally understood that this was something that we were not in control of.  She remained upset and anxious but not suicidal did not become psychotic or violent. Principal Problem: Bipolar 1 disorder, mixed, severe ([facility]) Diagnosis: Principal Problem:   Bipolar 1 disorder, mixed, severe ([facility]) Active Problems:   Hypothyroidism   Opiate dependence ([place])   Cannabis abuse  Total Time spent with patient: 30 minutes  Past Psychiatric History: Patient has a long history of bipolar and multiple hospitalizations for delirium related to substance abuse  Past Medical History:  Past Medical History:  Diagnosis Date  . Back pain   . Cancer ([place])    skin  . Depression    BAD  . Gastroparesis   . GERD (gastroesophageal reflux disease)   . Hypothyroidism   . Insomnia   . Migraines   . Parkinson disease ([place])    per [facility], dx'd 2019  . Recurrent cold sores   . Shoulder bursitis   . Urge incontinence     Past Surgical History:  Procedure Laterality  Date  . ABDOMINAL HYSTERECTOMY  1999   total  . APPENDECTOMY    . ESOPHAGOGASTRODUODENOSCOPY  01/2006   negative except small hiatal hernia  . ESOPHAGOGASTRODUODENOSCOPY  02/24/2015   See report  . LAPAROSCOPY     for endometriosis  . OVARIAN CYST REMOVAL  1990   unilateral  . TONSILLECTOMY AND ADENOIDECTOMY     Family History:  Family History  Problem Relation Age of Onset  . Hypertension Mother   . Arthritis Mother   . Diabetes Mother   . Stroke Mother   . Cancer Father        lung  . Colon cancer Neg Hx   . Breast cancer Neg Hx    Family Psychiatric  History: See previous Social History:  Social History   Substance and Sexual Activity  Alcohol Use No  . Alcohol/week: 0.0 standard drinks     Social History   Substance and Sexual Activity  Drug Use No    Social History   Socioeconomic History  . Marital status: Divorced    Spouse name: Not on file  . Number of children: Not on file  . Years of education: Not on file  . Highest education level: Not on file  Occupational History  . Not on file  Social Needs  . Financial resource strain: Not on file  . Food insecurity:    Worry: Not on file  Inability: Not on file  . Transportation needs:    Medical: Not on file    Non-medical: Not on file  Tobacco Use  . Smoking status: Former Smoker    Packs/day: 1.00    Years: 42.50    Pack years: 42.50    Types: Cigarettes  . Smokeless tobacco: Former Network engineer and Sexual Activity  . Alcohol use: No    Alcohol/week: 0.0 standard drinks  . Drug use: No  . Sexual activity: Never  Lifestyle  . Physical activity:    Days per week: Not on file    Minutes per session: Not on file  . Stress: Not on file  Relationships  . Social connections:    Talks on phone: Not on file    Gets together: Not on file    Attends religious service: Not on file    Active member of club or organization: Not on file    Attends meetings of clubs or organizations: Not on  file    Relationship status: Not on file  Other Topics Concern  . Not on file  Social History Narrative   Lives alone.     Has a Counsellor   Additional Social History:                         Sleep: Fair  Appetite:  Fair  Current Medications: Current Facility-Administered Medications  Medication Dose Route Frequency Provider Last Rate Last Dose  . acetaminophen (TYLENOL) tablet 650 mg  650 mg Oral Q6H PRN Lavella Hammock, MD      . albuterol (VENTOLIN HFA) 108 (90 Base) MCG/ACT inhaler 2 puff  2 puff Inhalation Q6H PRN Lavella Hammock, MD      . alum & mag hydroxide-simeth (MAALOX/MYLANTA) 200-200-20 MG/5ML suspension 30 mL  30 mL Oral Q4H PRN Lavella Hammock, MD   30 mL at 05/28/19 2148  . cholecalciferol (VITAMIN D) tablet 1,000 Units  1,000 Units Oral Daily Lavella Hammock, MD   1,000 Units at 05/29/19 0747  . feeding supplement (ENSURE ENLIVE) (ENSURE ENLIVE) liquid 237 mL  237 mL Oral BID BM ,  T, MD   237 mL at 05/29/19 1400  . hydrocerin (EUCERIN) cream 1 application  1 application Topical BID Lavella Hammock, MD   1 application at 10/21/24 [phone]  . hydrOXYzine (ATARAX/VISTARIL) tablet 50 mg  50 mg Oral TID PRN Lavella Hammock, MD   50 mg at 05/29/19 1521  . lamoTRIgine (LAMICTAL) tablet 50 mg  50 mg Oral Daily Lavella Hammock, MD   50 mg at 05/29/19 0747  . levothyroxine (SYNTHROID) tablet 88 mcg  88 mcg Oral QAC breakfast Lavella Hammock, MD   88 mcg at 05/29/19 [phone]  . magnesium hydroxide (MILK OF MAGNESIA) suspension 30 mL  30 mL Oral Daily PRN Lavella Hammock, MD      . multivitamin with minerals tablet 1 tablet  1 tablet Oral Daily Lavella Hammock, MD   1 tablet at 05/29/19 0747  . OLANZapine zydis (ZYPREXA) disintegrating tablet 15 mg  15 mg Oral QHS , [name], MD   15 mg at 05/28/19 2214  . omega-3 acid ethyl esters (LOVAZA) capsule 1 g  1 g Oral Daily Lavella Hammock, MD   1 g at 05/29/19 0758  . ondansetron (ZOFRAN-ODT)  disintegrating tablet 4 mg  4 mg Oral TID PRN Lavella Hammock, MD   4  mg at 05/28/19 0239  . oxyCODONE (Oxy IR/ROXICODONE) immediate release tablet 5 mg  5 mg Oral TID , [name], MD   5 mg at 05/29/19 1213  . pantoprazole (PROTONIX) EC tablet 40 mg  40 mg Oral Daily Lavella Hammock, MD   40 mg at 05/29/19 0646  . propranolol (INDERAL) tablet 10 mg  10 mg Oral BID , [name], MD   10 mg at 05/29/19 0757  . senna-docusate (Senokot-S) tablet 2 tablet  2 tablet Oral Daily Lavella Hammock, MD   2 tablet at 05/29/19 [phone]  . tuberculin injection 5 Units  5 Units Intradermal Once ,  T, MD      . valACYclovir (VALTREX) tablet 500 mg  500 mg Oral BID Lavella Hammock, MD   500 mg at 05/29/19 0756  . vitamin B-12 (CYANOCOBALAMIN) tablet 1,000 mcg  1,000 mcg Oral Daily Lavella Hammock, MD   1,000 mcg at 05/29/19 0747  . vitamin C (ASCORBIC ACID) tablet 500 mg  500 mg Oral Daily Lavella Hammock, MD   500 mg at 05/29/19 3557    Lab Results: No results found for this or any previous visit (from the past 48 hour(s)).  Blood Alcohol level:  Lab Results  Component Value Date   ETH <10 05/24/2019   ETH <10 32/20/2542    Metabolic Disorder Labs: Lab Results  Component Value Date   HGBA1C 5.4 10/13/2018   MPG 108.28 10/13/2018   MPG 111.15 08/29/2018   No results found for: PROLACTIN Lab Results  Component Value Date   CHOL 168 10/13/2018   TRIG 130 10/13/2018   HDL 39 (L) 10/13/2018   CHOLHDL 4.3 10/13/2018   VLDL 26 10/13/2018   LDLCALC 103 (H) 10/13/2018   LDLCALC 107 (H) 08/29/2018    Physical Findings: AIMS:  , ,  ,  ,    CIWA:    COWS:     Musculoskeletal: Strength & Muscle Tone: within normal limits Gait & Station: normal Patient leans: N/A  Psychiatric Specialty Exam: Physical Exam  Nursing note and vitals reviewed. Constitutional: She appears well-developed and well-nourished.  HENT:  Head: Normocephalic and atraumatic.  Eyes: Pupils are equal,  round, and reactive to light. Conjunctivae are normal.  Neck: Normal range of motion.  Cardiovascular: Normal heart sounds.  Respiratory: Effort normal.  GI: Soft.  Musculoskeletal: Normal range of motion.  Neurological: She is alert.  Skin: Skin is warm and dry.  Psychiatric: Judgment normal. Her mood appears anxious. Her affect is angry. Her speech is tangential. She is agitated. She is not aggressive. Thought content is not paranoid. Cognition and memory are impaired. She expresses no homicidal and no suicidal ideation.    Review of Systems  Constitutional: Negative.   HENT: Negative.   Eyes: Negative.   Respiratory: Negative.   Cardiovascular: Negative.   Gastrointestinal: Negative.   Musculoskeletal: Positive for back pain and joint pain.  Skin: Negative.   Neurological: Negative.   Psychiatric/Behavioral: Negative.     Blood pressure 121/82, pulse 94, temperature 98.3 F (36.8 C), temperature source Oral, resp. rate 18, height 4\' 11"  (1.499 m), weight 42.6 kg, SpO2 98 %.Body mass index is 18.97 kg/m.  General Appearance: Casual  Eye Contact:  Good  Speech:  Clear and Coherent  Volume:  Normal  Mood:  Euthymic  Affect:  Congruent  Thought Process:  Goal Directed  Orientation:  Full (Time, Place, and Person)  Thought Content:  Logical  Suicidal Thoughts:  No  Homicidal Thoughts:  No  Memory:  Immediate;   Fair Recent;   Fair Remote;   Fair  Judgement:  Fair  Insight:  Fair  Psychomotor Activity:  Normal  Concentration:  Concentration: Fair  Recall:  [employer] of Knowledge:  Fair  Language:  Fair  Akathisia:  No  Handed:  Right  AIMS (if indicated):     Assets:  Communication Skills Desire for Improvement Financial Resources/Insurance Housing Resilience Social Support  ADL's:  Intact  Cognition:  WNL  Sleep:  Number of Hours: 6.15     Treatment Plan Summary: Daily contact with patient to assess and evaluate symptoms and progress in treatment,  Medication management and Plan Patient with bipolar disorder and chronic issues related to substance use.  I had told the patient yesterday that she seemed to be in better condition than I had seen her in in a long time and I would stand by this assessment.  She is lucid and oriented and not showing any bizarre or major mood symptoms.  Patient reacted with extreme displeasure to the news that she was likely to be placed in a group home.  She stated that she did not believe that she had done anything to warrant this kind of treatment.  She strongly believes that she is capable of taking care of herself.  We spent some time trying to soothe her but also give her a realistic description of the procedure.  Patient will be discharged tomorrow.  I have told her that if it were up to me I would be fine with discharging her back home but that if Adult Protective Services intends to do something at [place] that is likely to be what they will do.  I reassured the patient that she would have the opportunity to defend herself before a judge before any permanent guardianship assignment.  No change to medicine.  Likely discharge tomorrow.  Alethia Berthold, MD 05/29/2019, 4:06 PM

## 2019-05-29 NOTE — BHH Counselor (Signed)
CSW spoke with Janetta Hora, owner of [facility] [phone]. She reports they have a female bed available and can pick up the patient tomorrow when she is discharged from the hospital. Ms. Primus Bravo reports her fax machine is down and request rsvp , tb test, covid 19 results and FL2 be provided when she picks up the patient. CSW contacted Abigail, pts APS worker to inform her of the placement. Vernie Shanks says she is in agreement with this placement and was provided with [facility]'s contact information.

## 2019-05-29 NOTE — Progress Notes (Signed)
Patient alert and oriented x 4, affect is blunted, thoughts are organized and coherent,  she appears anxious and worried about her pain medication schedule, writer explained to her what changes the HCP made to her order, she became restless demanded for the MD to be called because she was in severe pain. MD on call notified and order received for 5 mg oxycodone once   Patient took her medication, support and encouragement offered, 15 minutes safety checks maintained will continue to monitor
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
## 2019-05-30 MED ORDER — ALBUTEROL SULFATE HFA 108 (90 BASE) MCG/ACT IN AERS: 2.0000 | INHALATION_SPRAY | Freq: Four times a day (QID) | RESPIRATORY_TRACT | 1 refills | Status: DC | PRN

## 2019-05-30 MED ORDER — PROPRANOLOL HCL 10 MG PO TABS: 10.0000 mg | ORAL_TABLET | Freq: Two times a day (BID) | ORAL | 1 refills | Status: DC

## 2019-05-30 MED ORDER — VITAMIN D3 25 MCG (1000 UNIT) PO TABS: 1000.0000 [IU] | ORAL_TABLET | Freq: Every day | ORAL | 1 refills | Status: DC

## 2019-05-30 MED ORDER — OLANZAPINE 15 MG PO TBDP: 15.0000 mg | ORAL_TABLET | Freq: Every day | ORAL | 1 refills | Status: DC

## 2019-05-30 MED ORDER — LAMOTRIGINE 25 MG PO TABS: 50.0000 mg | ORAL_TABLET | Freq: Every day | ORAL | 1 refills | Status: DC

## 2019-05-30 MED ORDER — LEVOTHYROXINE SODIUM 88 MCG PO TABS: 88.0000 ug | ORAL_TABLET | Freq: Every day | ORAL | 1 refills | Status: DC

## 2019-05-30 NOTE — Progress Notes (Signed)
Pt denies SI and HI. Pt was educated on discharge summary and verbalized understanding. Pt received belongings, prescriptions, AVS, transition record and suicide risk assessment. Collier Bullock RN
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
## 2019-05-30 NOTE — Plan of Care (Signed)
  Problem: Self-Concept: Goal: Will verbalize positive feelings about self Outcome: Progressing  Patient verbalized positive believes about herself she denies SI.

## 2019-05-30 NOTE — BHH Suicide Risk Assessment (Signed)
[facility] Discharge Suicide Risk Assessment   Principal Problem: Bipolar 1 disorder, mixed, severe ([place]) Discharge Diagnoses: Principal Problem:   Bipolar 1 disorder, mixed, severe ([place]) Active Problems:   Hypothyroidism   Opiate dependence ([place])   Cannabis abuse   Total Time spent with patient: 45 minutes  Musculoskeletal: Strength & Muscle Tone: within normal limits Gait & Station: normal Patient leans: N/A  Psychiatric Specialty Exam: Review of Systems  Constitutional: Negative.   HENT: Negative.   Eyes: Negative.   Respiratory: Negative.   Cardiovascular: Negative.   Gastrointestinal: Negative.   Musculoskeletal: Negative.   Skin: Negative.   Neurological: Negative.   Psychiatric/Behavioral: Positive for substance abuse. Negative for depression, hallucinations, memory loss and suicidal ideas. The patient is not nervous/anxious and does not have insomnia.     Blood pressure 130/81, pulse 91, temperature 98.5 F (36.9 C), temperature source Oral, resp. rate 18, height 4\' 11"  (1.499 m), weight 42.6 kg, SpO2 99 %.Body mass index is 18.97 kg/m.  General Appearance: Fairly Groomed  Engineer, water::  Good  Speech:  Clear and [id number]  Volume:  Normal  Mood:  Euthymic  Affect:  Congruent  Thought Process:  Goal Directed  Orientation:  Full (Time, Place, and Person)  Thought Content:  Logical  Suicidal Thoughts:  No  Homicidal Thoughts:  No  Memory:  Immediate;   Fair Recent;   Fair Remote;   Fair  Judgement:  Fair  Insight:  Fair  Psychomotor Activity:  Normal  Concentration:  Fair  Recall:  [employer] of [place]  Language: Fair  Akathisia:  No  Handed:  Right  AIMS (if indicated):     Assets:  Desire for Improvement Housing Resilience Social Support  Sleep:  Number of Hours: 7  Cognition: WNL  ADL's:  Intact   Mental Status Per Nursing Assessment::   On Admission:  Plan to harm others  Demographic Factors:  Caucasian, Low socioeconomic status and  Living alone  Loss Factors: Financial problems/change in socioeconomic status  Historical Factors: Impulsivity  Risk Reduction Factors:   Responsible for children under 34 years of age, Sense of responsibility to family, Religious beliefs about death, Positive social support and Positive therapeutic relationship  Continued Clinical Symptoms:  Bipolar Disorder:   Mixed State Alcohol/Substance Abuse/Dependencies  Cognitive Features That Contribute To Risk:  None    Suicide Risk:  Minimal: No identifiable suicidal ideation.  Patients presenting with no risk factors but with morbid ruminations; may be classified as minimal risk based on the severity of the depressive symptoms  Follow-up Information    Care, [place] on 06/04/2019.   Why:  Please follow up with Dr. Hassan Buckler on Tuesday, June 9th at 1:40pm. Please have hospital discharge paperwork available. Thank you. Contact information: [place] [address] [phone]           Plan Of Care/Follow-up recommendations:  Activity:  Activity as tolerated.  I continue to support the idea that she consider minimize driving and not to drive particularly in the evening or after having taken her pain medicine Diet:  Regular diet Other:  Patient is discharged with follow-up with her usual providers.  Prescriptions provided only for changed medicines or psychiatric medicines.  Patient has been advised multiple times to cut down on the amount of opiates she is using and to discontinue her use of other abusable drugs.  At this point she no longer meets commitment criteria.  Does not represent an acute danger.  Jenny Reichmann  [name], MD 05/30/2019, 10:32 AM

## 2019-05-30 NOTE — Progress Notes (Signed)
Recreation Therapy Notes  INPATIENT RECREATION TR PLAN  Patient Details Name: Leslie Wong MRN: [id number] DOB: April 20, 1956 Today's Date: 05/30/2019  Rec Therapy Plan Is patient appropriate for Therapeutic Recreation?: Yes Treatment times per week: at least 3 Estimated Length of Stay: 5-7 days TR Treatment/Interventions: Group participation (Comment)  Discharge Criteria Pt will be discharged from therapy if:: Discharged Treatment plan/goals/alternatives discussed and agreed upon by:: Patient/family  Discharge Summary Short term goals set: Patient will engage in groups without prompting or encouragement from LRT x3 group sessions within 5 recreation therapy group sessions Short term goals met: Complete Progress toward goals comments: Groups attended Which groups?: Goal setting, Wellness, Anger management Reason goals not met: Patient will engage in groups without prompting or encouragement from LRT x3 group sessions within 5 recreation therapy group sessions Therapeutic equipment acquired: N/A Reason patient discharged from therapy: Discharge from hospital Pt/family agrees with progress & goals achieved: Yes Date patient discharged from therapy: 05/30/19   Darrill Vreeland 05/30/2019, 1:31 PM

## 2019-05-30 NOTE — Plan of Care (Signed)
Pt rates depression 6/10 and anxiety 9/10. Pt denies SI,  HI and AVH. Pt has been educated on care plan and verbalizes understanding. Collier Bullock RN Problem: Education: Goal: Knowledge of [place] [facility] Education information/materials will improve Outcome: Adequate for Discharge Goal: Emotional status will improve Outcome: Adequate for Discharge Goal: Mental status will improve Outcome: Adequate for Discharge Goal: Verbalization of understanding the information provided will improve Outcome: Adequate for Discharge   Problem: Activity: Goal: Interest or engagement in activities will improve Outcome: Adequate for Discharge Goal: Sleeping patterns will improve Outcome: Adequate for Discharge   Problem: Self-Concept: Goal: Will verbalize positive feelings about self Outcome: Adequate for Discharge

## 2019-05-30 NOTE — Progress Notes (Signed)
Patient alert and oriented x 4 , affect is blunted, she appears irritable and restless, noted pacing the unit, making several phone calls tearful at times, and she was offered support and encouragement. Patient expressed to Probation officer " I don't want to go to group home'",  Writer explained to patient it was out of our units hands because adult protective serviced made the decision for her own safety, patient stated " l understand" she stooped crying took her prescribed night time medicine, 15 minutes safety checks  maintained will continue to monitor.

## 2019-05-30 NOTE — BHH Counselor (Signed)
CSW informed by pt's APS worker(Abigail Gross) she is no longer pursuing a protective order for temporary guardianship of the pt. She reports the DSS legal dept states there is not enough evidence at this time to grant a protective order. Abigail request that the pt be discharged to her home and she will follow up with the pt and continue to assess for any safety issues. CSW relayed this information to the pt's physician(J.[name]) and he is in agreement with the pt being discharged to her home.

## 2019-05-30 NOTE — Progress Notes (Signed)
  [facility] Adult Case Management Discharge Plan :  Will you be returning to the same living situation after discharge:  Yes,  lives alone At discharge, do you have transportation home?: Yes,  sister or daughter will provide transportation Do you have the ability to pay for your medications: Yes,  insurance  Release of information consent forms completed and in the chart;  Patient's signature needed at discharge.  Patient to Follow up at: Follow-up Information    [facility], [place] on 06/04/2019.   Why:  Please follow up with Dr. Hassan Buckler on Tuesday, June 9th at 1:40pm. Please have hospital discharge paperwork available. Thank you. Contact information: [place] [address] [phone]           Next level of care provider has access to [facility] and Suicide Prevention discussed: Yes,  Clarita Leber, daughter  Have you used any form of tobacco in the last 30 days? (Cigarettes, Smokeless Tobacco, Cigars, and/or Pipes): Yes  Has patient been referred to the Quitline?: N/A patient is not a smoker  Patient has been referred for addiction treatment: N/A  Yvette Rack, LCSW 05/30/2019, 11:18 AM

## 2019-05-30 NOTE — BHH Group Notes (Signed)
Balance In Life 05/30/2019 1PM  Type of Therapy/Topic:  Group Therapy:  Balance in Life  Participation Level:  Did Not Attend  Description of Group:   This group will address the concept of balance and how it feels and looks when one is unbalanced. Patients will be encouraged to process areas in their lives that are out of balance and identify reasons for remaining unbalanced. Facilitators will guide patients in utilizing problem-solving interventions to address and correct the stressor making their life unbalanced. Understanding and applying boundaries will be explored and addressed for obtaining and maintaining a balanced life. Patients will be encouraged to explore ways to assertively make their unbalanced needs known to significant others in their lives, using other group members and facilitator for support and feedback.  Therapeutic Goals: 1. Patient will identify two or more emotions or situations they have that consume much of in their lives. 2. Patient will identify signs/triggers that life has become out of balance:  3. Patient will identify two ways to set boundaries in order to achieve balance in their lives:  4. Patient will demonstrate ability to communicate their needs through discussion and/or role plays  Summary of Patient Progress:    Therapeutic Modalities:   Cognitive Behavioral Therapy Solution-Focused Therapy Assertiveness Training  Anjuli Gemmill Lynelle Smoke, LCSW

## 2019-05-30 NOTE — Progress Notes (Signed)
Recreation Therapy Notes   Date: 05/30/2019  Time: 9:30 am  Location: Craft Room  Behavioral response: Appropriate  Intervention Topic: Goals  Discussion/Intervention:  Group content on today was focused on goals. Patients described what goals are and how they define goals. Individuals expressed how they go about setting goals and reaching them. The group identified how important goals are and if they make short term goals to reach long term goals. Patients described how many goals they work on at a time and what affects them not reaching their goal. Individuals described how much time they put into planning and obtaining their goals. The group participated in the intervention "My Goal Board" and made personal goal boards to help them achieve their goal. Clinical Observations/Feedback:  Patient came to group and explained that her goal is to go home. Individual was social with peers and staff while participating in the intervention. Lorrine Killilea LRT/CTRS           Doraine Schexnider 05/30/2019 1:05 PM

## 2019-05-31 ENCOUNTER — Other Ambulatory Visit: Payer: Self-pay | Admitting: Psychiatry

## 2019-06-04 DIAGNOSIS — G20 Parkinson's disease: Secondary | ICD-10-CM | POA: Diagnosis not present

## 2019-06-04 DIAGNOSIS — F411 Generalized anxiety disorder: Secondary | ICD-10-CM | POA: Diagnosis not present

## 2019-06-04 DIAGNOSIS — F062 Psychotic disorder with delusions due to known physiological condition: Secondary | ICD-10-CM | POA: Diagnosis not present

## 2019-06-04 DIAGNOSIS — F3163 Bipolar disorder, current episode mixed, severe, without psychotic features: Secondary | ICD-10-CM | POA: Diagnosis not present

## 2019-06-14 ENCOUNTER — Encounter: Payer: Self-pay | Admitting: Family Medicine

## 2019-06-14 ENCOUNTER — Ambulatory Visit (INDEPENDENT_AMBULATORY_CARE_PROVIDER_SITE_OTHER): Payer: Medicare Other | Admitting: Family Medicine

## 2019-06-14 ENCOUNTER — Other Ambulatory Visit: Payer: Self-pay

## 2019-06-14 VITALS — BP 120/80 | HR 92 | Temp 98.5°F | Wt 105.0 lb

## 2019-06-14 DIAGNOSIS — F3163 Bipolar disorder, current episode mixed, severe, without psychotic features: Secondary | ICD-10-CM | POA: Diagnosis not present

## 2019-06-14 MED ORDER — OXYCODONE HCL 5 MG PO TABS: 5.0000 mg | ORAL_TABLET | ORAL | Status: DC | PRN

## 2019-06-14 MED ORDER — OMEPRAZOLE 20 MG PO CPDR: 20.0000 mg | DELAYED_RELEASE_CAPSULE | Freq: Every day | ORAL | Status: DC

## 2019-06-14 NOTE — Progress Notes (Signed)
Here today with daughter, [name].  Daughter had talked with APS in the meantime.   Per Larissa's report- She was going to get tested for COVID and the placed on 05/27/2019.  IVC was done to expedite COVID testing.  On 05/27/2019, her inpatient stay was extended to 05/30/2019.  There was the plan for protective custody initially but she didn't meet commitment/guardianship criteria as of 05/30/2019 since she was improved.    Pt was discharged and then on 05/31/2019 was back home. Her grandson was with the patient on the 5th.  Per [place] daughter, APS came by and then Haze Boyden was placed in protective custody.  The patient and Dellis Anes have not seen Haze Boyden in the meantime.  Daughter contacted a lawyer about regaining custody. ============================= For record-keeping purposes, I have cut and pasted part of her discharge summary in this note.  Hospital Course: Patient admitted to the psychiatric unit and from the first time I saw her it seemed clear to me that she was in better shape mentally than I have seen her in quite some time.  She was alert and oriented x4.  Her short-term memory was intact.  She was taking care of her hygiene and dressing herself completely normally.  She was able to explain the circumstances around her admission lucidly.Marland KitchenMarland KitchenGiven that she appears to be clinically stable and no longer meeting commitment criteria it seemed wrong to keep her in the hospital and we were planning for discharge today. ============================== In talking with the patient today she is alert and oriented and has no suicidal or homicidal intent.  She reports being compliant with her medications and has follow-up with psychiatry pending for the near future.  The patient reports she is able to do some cooking and do her dishes now.  She is bathing and doing her laundry.  She is up and active.  Her daughter validates this.  Meds, vitals, and allergies reviewed.   ROS: Per HPI unless specifically  indicated in ROS section   No apparent distress Speech normal. Appears well-groomed. Judgment appears intact Alert and oriented x3. She does not appear disheveled and globally she looks better than at previous office visits. rrr ctab abd soft Ext w/o edema.

## 2019-06-14 NOTE — Patient Instructions (Signed)
Don't change your meds for now, keep the appointment with psychiatry and update me as needed.  Take care.  Glad to see you.

## 2019-06-16 NOTE — Assessment & Plan Note (Signed)
See above.  Patient clearly looks improved compared to previous visits.  Her thoughts are organized and her judgment appears intact.  See exam above.  She has no suicidal or homicidal intent.  Her daughter [name] has already contacted law here about regaining custody in the meantime.  Patient has follow-up pending with psychiatry.  She reports being compliant with medications.  I encouraged her to use the lowest dose of opiates possible for her baseline pain.  I appreciate the help of all involved.  We talked about her situation in detail.  I reviewed the court papers that her daughter [name] presented to me today.  There is at least one factual error in the report, where it states that "during that hospitalization, Dr. Elsie Stain was concerned about Ms. Piatt' gaunt appearance and extreme weight loss."  I was not involved with her care during the hospitalization, so that statement would not apply.  >40 minutes spent in face to face time with patient, >50% spent in counselling or coordination of care.

## 2019-06-18 DIAGNOSIS — F3489 Other specified persistent mood disorders: Secondary | ICD-10-CM | POA: Diagnosis not present

## 2019-06-18 DIAGNOSIS — F411 Generalized anxiety disorder: Secondary | ICD-10-CM | POA: Diagnosis not present

## 2019-06-18 DIAGNOSIS — G20 Parkinson's disease: Secondary | ICD-10-CM | POA: Diagnosis not present

## 2019-06-19 ENCOUNTER — Telehealth: Payer: Self-pay | Admitting: Family Medicine

## 2019-06-19 NOTE — Telephone Encounter (Signed)
Patient's daughter called yesterday.  I called her back today.  Case discussed, she informed me that patient had f/u court proceedings pending.    I called A Gross in response to incoming call.  Gross requested a copy of patient's most recent OV note.  Her fax is [phone].   Per her report, Gross has already sent a record release to the office.  If this is the case, then please send most recent OV note.   Gross was asking about the patient's status and the need for a guardian.  In this case, I refer/defer back to my prev OV note and would defer to psychiatry input.  I also defer back to patient's discharge summary.  I don't yet have most recent OV note from psychiatry, which should be arriving soon.    I thank all involved.

## 2019-06-19 NOTE — Telephone Encounter (Signed)
Best number [phone] Fabienne Bruns @ [facility] adult protective services called  They have interm guardenship hearing tomorrow 6/25.  Her permant hearing is in August And she wanted if you have additional concerns or are same concerns from a month  still there.  She stated the family thinks pt does not need guardianship   Please call Abigail  before 12 tomorrow

## 2019-07-01 ENCOUNTER — Other Ambulatory Visit: Payer: Self-pay | Admitting: Family Medicine

## 2019-07-08 ENCOUNTER — Other Ambulatory Visit: Payer: Medicare Other

## 2019-07-08 ENCOUNTER — Other Ambulatory Visit (INDEPENDENT_AMBULATORY_CARE_PROVIDER_SITE_OTHER): Payer: Medicare Other

## 2019-07-08 DIAGNOSIS — E039 Hypothyroidism, unspecified: Secondary | ICD-10-CM | POA: Diagnosis not present

## 2019-07-08 LAB — TSH: TSH: 1.32 u[IU]/mL (ref 0.35–4.50)

## 2019-07-09 ENCOUNTER — Telehealth: Payer: Self-pay | Admitting: *Deleted

## 2019-07-09 DIAGNOSIS — M5136 Other intervertebral disc degeneration, lumbar region: Secondary | ICD-10-CM | POA: Diagnosis not present

## 2019-07-09 DIAGNOSIS — M545 Low back pain: Secondary | ICD-10-CM | POA: Diagnosis not present

## 2019-07-09 DIAGNOSIS — M25561 Pain in right knee: Secondary | ICD-10-CM | POA: Diagnosis not present

## 2019-07-09 DIAGNOSIS — M1288 Other specific arthropathies, not elsewhere classified, other specified site: Secondary | ICD-10-CM | POA: Diagnosis not present

## 2019-07-09 NOTE — Telephone Encounter (Signed)
See previous note.  Patient has appointment on Thursday for a CPE.

## 2019-07-09 NOTE — Telephone Encounter (Signed)
Pharmacist from [facility] left a voicemail stating that they need new scripts sent to them for all of patient's medications because they have not been filling them for her. They will be blister packing her medications and requested that they get them today or tomorrow.

## 2019-07-10 MED ORDER — VALACYCLOVIR HCL 500 MG PO TABS: ORAL_TABLET | ORAL | 5 refills | Status: DC

## 2019-07-10 NOTE — Telephone Encounter (Signed)
Most of her medications are coming through outside facilities at this point.  I sent her Valtrex prescription.  If there is another specific medication that I had filled previously and for which they need a new prescription now please let me know.  Thanks.

## 2019-07-11 ENCOUNTER — Ambulatory Visit (INDEPENDENT_AMBULATORY_CARE_PROVIDER_SITE_OTHER): Payer: Medicare Other | Admitting: Family Medicine

## 2019-07-11 ENCOUNTER — Other Ambulatory Visit: Payer: Self-pay

## 2019-07-11 ENCOUNTER — Encounter: Payer: Self-pay | Admitting: Family Medicine

## 2019-07-11 ENCOUNTER — Ambulatory Visit: Payer: Medicare Other

## 2019-07-11 VITALS — BP 124/72 | HR 90 | Temp 98.4°F | Ht 59.0 in | Wt 104.4 lb

## 2019-07-11 DIAGNOSIS — L989 Disorder of the skin and subcutaneous tissue, unspecified: Secondary | ICD-10-CM

## 2019-07-11 DIAGNOSIS — Z7189 Other specified counseling: Secondary | ICD-10-CM

## 2019-07-11 DIAGNOSIS — G8929 Other chronic pain: Secondary | ICD-10-CM

## 2019-07-11 DIAGNOSIS — Z0000 Encounter for general adult medical examination without abnormal findings: Secondary | ICD-10-CM | POA: Diagnosis not present

## 2019-07-11 DIAGNOSIS — E039 Hypothyroidism, unspecified: Secondary | ICD-10-CM

## 2019-07-11 DIAGNOSIS — F3163 Bipolar disorder, current episode mixed, severe, without psychotic features: Secondary | ICD-10-CM

## 2019-07-11 MED ORDER — TIZANIDINE HCL 4 MG PO TABS: 4.0000 mg | ORAL_TABLET | Freq: Three times a day (TID) | ORAL | 1 refills | Status: DC | PRN

## 2019-07-11 MED ORDER — HYDROXYZINE HCL 50 MG PO TABS: 50.0000 mg | ORAL_TABLET | Freq: Four times a day (QID) | ORAL | Status: DC | PRN

## 2019-07-11 MED ORDER — LEVOTHYROXINE SODIUM 88 MCG PO TABS: 88.0000 ug | ORAL_TABLET | Freq: Every day | ORAL | 3 refills | Status: DC

## 2019-07-11 MED ORDER — FLUOCINONIDE 0.05 % EX CREA: 1.0000 "application " | TOPICAL_CREAM | Freq: Two times a day (BID) | CUTANEOUS | 0 refills | Status: DC | PRN

## 2019-07-11 NOTE — Progress Notes (Signed)
I have personally reviewed the Medicare Annual Wellness questionnaire and have noted 1. The patient's medical and social history 2. Their use of alcohol, tobacco or illicit drugs 3. Their current medications and supplements 4. The patient's functional ability including ADL's, fall risks, home safety risks and hearing or visual             impairment. 5. Diet and physical activities 6. Evidence for depression or mood disorders  The patients weight, height, BMI have been recorded in the chart and visual acuity is per eye clinic.  I have made referrals, counseling and provided education to the patient based review of the above and I have provided the pt with a written personalized care plan for preventive services.  Provider list updated- see scanned forms.  Routine anticipatory guidance given to patient.  See health maintenance. The possibility exists that previously documented standard health maintenance information may have been brought forward from a previous encounter into this note.  If needed, that same information has been updated to reflect the current situation based on today's encounter.    Flu due fall 2012 Shingles discussed with patient, she can check on coverage. PNA due at 67 Tetanus 2018 Colonoscopy 2016 Breast cancer screening-follow-up pending discussed with patient and daughter DXA due at 62. Advance directive-daughter designated if patient were incapacitated. Cognitive function addressed- see scanned forms- and if abnormal then additional documentation follows.  She is still seeing psych clinic with f/u pending next week.  I'll defer.  No SI/HI.  She is still at home.  Her daughter is helping out.  She has a temporary guardian through DSS with f/u hearing pending.    She is putting up with back pain at baseline.  She is still seeing pain clinic.  Taking tizanidine prn with relief w/o ADE.  No safety issues at home now per patient report.    Still with dry cracking skin  on the hands B.  Eucerin didn't help.    Hypothyroidism.  No neck mass, no lumps.  TSH wnl.  Labs discussed with patient.  Compliant with medication.  PMH and SH reviewed  Meds, vitals, and allergies reviewed.   ROS: Per HPI.  Unless specifically indicated otherwise in HPI, the patient denies:  General: fever. Eyes: acute vision changes ENT: sore throat Cardiovascular: chest pain Respiratory: SOB GI: vomiting GU: dysuria Musculoskeletal: acute back pain Derm: acute rash Neuro: acute motor dysfunction Psych: worsening mood Endocrine: polydipsia Heme: bleeding Allergy: hayfever  GEN: nad, alert and oriented, speech fluent.  Not disheveled.  No tremor.  Dressed neatly.   She follows conversation normally. HEENT: mucous membranes moist NECK: supple w/o LA CV: rrr. PULM: ctab, no inc wob ABD: soft, +bs EXT: no edema SKIN: no acute rash but dry cracking skin in the B hands and less so on the L foot.

## 2019-07-11 NOTE — Patient Instructions (Addendum)
Check with your insurance/pharmacy to see if they will cover the shingles shot. Please call about a follow up mammogram.  Use the skin cream as needed twice a day on your hands.  I would get a flu shot each fall.   Take care.  Glad to see you.

## 2019-07-11 NOTE — Telephone Encounter (Signed)
[facility] advised.

## 2019-07-12 ENCOUNTER — Telehealth: Payer: Self-pay

## 2019-07-12 NOTE — Telephone Encounter (Signed)
Charlena Cross is the pt's Education officer, museum for [employer] and they have custody of her. Needs providers input on pt. Please call [phone]. Needs to speak to him before court that is the end of next week.

## 2019-07-12 NOTE — Telephone Encounter (Signed)
Called and LMOVM - no confidential info left on the message- asking her to call back on Monday and request a copy of my last OV note, assuming we have a record release on file.

## 2019-07-14 NOTE — Assessment & Plan Note (Signed)
Advance directive- daughter designated if patient were incapacitated.   

## 2019-07-14 NOTE — Assessment & Plan Note (Addendum)
She is putting up with back pain at baseline.  She is still seeing pain clinic.  Taking tizanidine prn with relief w/o ADE.  No safety issues at home now per patient report.   I will defer to her pain clinic.

## 2019-07-14 NOTE — Assessment & Plan Note (Signed)
She is still seeing psych clinic with f/u pending next week.  I'll defer.  No SI/HI.  She is still at home.  Her daughter is helping out.  She has a temporary guardian through DSS with f/u hearing pending.

## 2019-07-14 NOTE — Assessment & Plan Note (Signed)
Hypothyroidism.  No neck mass, no lumps.  TSH wnl.  Labs discussed with patient.  Compliant with medication.

## 2019-07-14 NOTE — Assessment & Plan Note (Signed)
Flu due fall 2012 Shingles discussed with patient, she can check on coverage. PNA due at 67 Tetanus 2018 Colonoscopy 2016 Breast cancer screening-follow-up pending discussed with patient and daughter DXA due at 66. Advance directive-daughter designated if patient were incapacitated. Cognitive function addressed- see scanned forms- and if abnormal then additional documentation follows.

## 2019-07-14 NOTE — Assessment & Plan Note (Signed)
She can try fluocinonide cream with routine cautions and update me as needed.

## 2019-07-19 NOTE — Telephone Encounter (Signed)
I received the medical record request from [place] and it has been scanned into pt's documents in her chart. I have faxed to [phone]. I spoke with Charlena Cross to let her know records were faxed and that Dr Damita Dunnings received the questionnaire and recommendation request and will send that as soon as it completed.

## 2019-07-19 NOTE — Telephone Encounter (Signed)
I spoke with Leslie Wong, she will refax the medical release form and will need a medical recommendation.

## 2019-07-21 NOTE — Telephone Encounter (Signed)
Form done.  Please scan and send.  Thanks.  

## 2019-07-22 ENCOUNTER — Other Ambulatory Visit: Payer: Self-pay | Admitting: Family Medicine

## 2019-07-22 NOTE — Telephone Encounter (Signed)
Form faxed and sent for scanning.

## 2019-07-23 DIAGNOSIS — M5136 Other intervertebral disc degeneration, lumbar region: Secondary | ICD-10-CM | POA: Diagnosis not present

## 2019-07-23 DIAGNOSIS — M545 Low back pain: Secondary | ICD-10-CM | POA: Diagnosis not present

## 2019-07-23 DIAGNOSIS — M542 Cervicalgia: Secondary | ICD-10-CM | POA: Diagnosis not present

## 2019-07-23 DIAGNOSIS — M5030 Other cervical disc degeneration, unspecified cervical region: Secondary | ICD-10-CM | POA: Diagnosis not present

## 2019-07-24 NOTE — Telephone Encounter (Signed)
Ebony with [facility] called about the patient's prescription's being sent over to [facility] in [place]. She stated they advised her that all the prescriptions were not sent. Charlena Cross stated that she needs ALL prescriptions that Dr Damita Dunnings prescribed sent.    Ebony's C/B # [phone]

## 2019-07-25 MED ORDER — VITAMIN B-12 1000 MCG PO TABS: 1000.0000 ug | ORAL_TABLET | Freq: Every day | ORAL | 3 refills | Status: DC

## 2019-07-25 MED ORDER — VITAMIN D3 25 MCG (1000 UNIT) PO TABS: 1000.0000 [IU] | ORAL_TABLET | Freq: Every day | ORAL | 3 refills | Status: DC

## 2019-07-25 MED ORDER — FISH OIL 1000 MG PO CAPS: 1000.0000 mg | ORAL_CAPSULE | Freq: Every day | ORAL | 3 refills | Status: DC

## 2019-07-25 MED ORDER — ALBUTEROL SULFATE HFA 108 (90 BASE) MCG/ACT IN AERS: 2.0000 | INHALATION_SPRAY | Freq: Four times a day (QID) | RESPIRATORY_TRACT | 11 refills | Status: DC | PRN

## 2019-07-25 MED ORDER — ASCORBIC ACID 500 MG PO TABS: 500.0000 mg | ORAL_TABLET | Freq: Every day | ORAL | 3 refills | Status: DC

## 2019-07-25 MED ORDER — PROPRANOLOL HCL 10 MG PO TABS: 10.0000 mg | ORAL_TABLET | Freq: Two times a day (BID) | ORAL | 3 refills | Status: DC

## 2019-07-25 MED ORDER — SENNOSIDES-DOCUSATE SODIUM 8.6-50 MG PO TABS: 2.0000 | ORAL_TABLET | Freq: Every day | ORAL | 3 refills | Status: DC

## 2019-07-25 MED ORDER — OMEPRAZOLE 20 MG PO CPDR: 20.0000 mg | DELAYED_RELEASE_CAPSULE | Freq: Every day | ORAL | 3 refills | Status: DC

## 2019-07-25 MED ORDER — ONE-DAILY MULTI VITAMINS PO TABS: 1.0000 | ORAL_TABLET | Freq: Every day | ORAL | 3 refills | Status: DC

## 2019-07-25 NOTE — Telephone Encounter (Signed)
Dr. Damita Dunnings disregard message below.  Leslie Wong called back from [facility]. They will do 2 different med blister packs for patient-1 from psychiatrist and 1 from Dr Damita Dunnings. They will get an updated medication list from [name] and psychiatrist office so they can make sure they fill the write medications for her. The med pack has to be done in order for adult protective services to close patient's case per pharmacist and information she has about this. Medication list from [name] has been sent to Leslie Wong at [phone]

## 2019-07-25 NOTE — Telephone Encounter (Signed)
Spoke with Mickel Baas, pharmacist at [facility], so the issue is that Education officer, museum and daughter were calling trying to get a blister pack for patient's medications to be done by the pharmacist. But the issue is 2 different providers are managing different medications and at first daughter was telling them that one pharmacy will get 1 box of medication ready from 1 provider and the other pharmacy will get it from the other doctor. Pharmacist is not comfortable with that and that would get confusing. As of today per daughters request a fax was sent over from [facility] asking if Dr. Damita Dunnings can refill all of her medications so they can fix the box. Will send for review for Dr. Damita Dunnings.

## 2019-07-25 NOTE — Telephone Encounter (Signed)
Received fax from [facility] with a list of medication prescriptions they were needing.  All refills sent as requested except for Ondansetron 4 mg.  That one has to be approved by MD.  Durene Cal to Dr. Damita Dunnings to approve.

## 2019-07-25 NOTE — Addendum Note (Signed)
Addended by: Carter Kitten on: 07/25/2019 04:43 PM   Modules accepted: Orders

## 2019-07-25 NOTE — Telephone Encounter (Signed)
Based on EMR records, the meds that I had routine rx'd were already send to [facility] this month.  If they have a specific question about a specific med, then let me know/have pharmacy contact us.  I also don't know about releasing information to [place] about this, unless it is specifically covered by the prev release of information.  Thanks.

## 2019-07-26 MED ORDER — ONDANSETRON 4 MG PO TBDP: 4.0000 mg | ORAL_TABLET | Freq: Three times a day (TID) | ORAL | 1 refills | Status: DC | PRN

## 2019-07-26 NOTE — Telephone Encounter (Signed)
Noted. Thanks.

## 2019-07-26 NOTE — Telephone Encounter (Signed)
Sent. Thanks.   

## 2019-07-26 NOTE — Addendum Note (Signed)
Addended by: Tonia Ghent on: 07/26/2019 06:55 AM   Modules accepted: Orders

## 2019-07-29 ENCOUNTER — Telehealth: Payer: Self-pay
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
## 2019-07-29 NOTE — Telephone Encounter (Signed)
Her pain meds/mgmt needs to come through the pain clinic.  She needs to contact them.

## 2019-07-30 NOTE — Telephone Encounter (Signed)
Pt aware of rec's per Dr Damita Dunnings. Will wait for cream refill.  Nothing further needed.

## 2019-07-30 NOTE — Telephone Encounter (Signed)
ATC, rang several times, no answer Will call back

## 2019-07-30 NOTE — Telephone Encounter (Signed)
The nortriptyline needs to come though the pain clinic.  I will look at the order about the cream and I'll work on it.  Thanks.

## 2019-07-30 NOTE — Telephone Encounter (Signed)
Pt aware.  Pt is wanting to know if Dr Damita Dunnings will prescribe the Nortriptyline 25mg , not Percocet.  Pt states that she has left VMs for Dr Sanjuan Dame but has not gotten a call back.   Please advise, thanks.   Pt also needs refill of cream - fluocinonide cream 9.79 % - Apply 1 application topically 2 (two) times daily as needed. Leslie Wong, Leslie Wong

## 2019-07-31 MED ORDER — FLUOCINONIDE 0.05 % EX CREA: 1.0000 "application " | TOPICAL_CREAM | Freq: Two times a day (BID) | CUTANEOUS | 0 refills | Status: DC | PRN

## 2019-07-31 NOTE — Telephone Encounter (Signed)
rx sent.  Thanks.  

## 2019-08-01 ENCOUNTER — Other Ambulatory Visit: Payer: Self-pay | Admitting: Orthopaedic Surgery

## 2019-08-01 DIAGNOSIS — M5136 Other intervertebral disc degeneration, lumbar region: Secondary | ICD-10-CM

## 2019-08-01 DIAGNOSIS — M5030 Other cervical disc degeneration, unspecified cervical region: Secondary | ICD-10-CM

## 2019-08-02 ENCOUNTER — Telehealth: Payer: Self-pay

## 2019-08-02 NOTE — Telephone Encounter (Signed)
[place], SW of [place] assigned guardianship of patient, calls to ask general questions regarding what would symptoms typically be if someone were to overdose on the following medications:  1. Tizanidine  2. Hydroxyzine  She also asked me (in general) what hydroxyzine is used for and what is ondansetron.  I provided her with the following general drug information but did not confirm nor deny patient's medical information, conditions or history.  In addition, I informed her that for specifics regarding patient's medication care management, we would need to consult with the provider.  Ebony verbalizes understanding.  Tizanidine (muscle relaxer) - general overdose symptoms include fatigue, lethargy, increased confusion, drowsiness, in high doses can be a respiratory depressant, fainting, dizziness.   Hydroxyzine (Antihistamine but can also be used for anxiety) - would need to defer to PCP/psych as to which clinical use is indicated for a patient.  But overdose symptoms could include, lethargy, delirium, drowsiness, agitation, confusion, decreased level of consciousness, increased depression, and even convulsions.   Ondansetron (Zofran) - general information use is that this is an antinausea medication.   In closing conversation, Leslie Wong (SW) states that she needs Dr. Damita Dunnings to put in letter format his response regarding patients current state as noted in his 7/16 office visit.  Leslie Wong states that Dr. Damita Dunnings sent a note referring to this OV where he states that " patient was better than he has seen her in a long time".  And would like that, in particular, along with any driving concerns noted in the letter.    She asks for this letter as soon as possible on Monday because she goes to court for patient on Tuesday to review her case.  She cannot use the OV note and needs it stated in letter head letter format.  Please fax the letter to her on Monday  To [phone] attn: Leslie Wong.   Forwarding to  Dr. Damita Dunnings and will follow up with him on this Monday.

## 2019-08-04 NOTE — Telephone Encounter (Signed)
I will work on a letter when possible.  I was out of clinic on Friday when the request came in and she is asking for the next business day.

## 2019-08-05 ENCOUNTER — Telehealth: Payer: Self-pay | Admitting: Family Medicine

## 2019-08-05 NOTE — Telephone Encounter (Signed)
Ebony advised that letter was faxed over.

## 2019-08-05 NOTE — Telephone Encounter (Signed)
Letter done/printed.  Please see prev phone notes to fax this out.  Thanks.

## 2019-08-05 NOTE — Telephone Encounter (Signed)
Ebony advised letter faxed over to her now.

## 2019-08-05 NOTE — Telephone Encounter (Signed)
Best number [phone]  Charlena Cross called checking on letter

## 2019-08-09 ENCOUNTER — Ambulatory Visit
Admission: RE | Admit: 2019-08-09 | Discharge: 2019-08-09 | Disposition: A | Discharge status: Home / Self Care | Payer: Medicare Other | Source: Ambulatory Visit | Attending: Family Medicine | Admitting: Family Medicine

## 2019-08-09 ENCOUNTER — Other Ambulatory Visit: Payer: Self-pay

## 2019-08-09 DIAGNOSIS — R928 Other abnormal and inconclusive findings on diagnostic imaging of breast: Secondary | ICD-10-CM | POA: Diagnosis not present

## 2019-08-09 DIAGNOSIS — N6489 Other specified disorders of breast: Secondary | ICD-10-CM

## 2019-08-09 DIAGNOSIS — R922 Inconclusive mammogram: Secondary | ICD-10-CM | POA: Diagnosis not present

## 2019-08-12 ENCOUNTER — Ambulatory Visit
Admission: RE | Admit: 2019-08-12 | Discharge: 2019-08-12 | Disposition: A | Discharge status: Home / Self Care | Payer: Medicare Other | Source: Ambulatory Visit | Attending: Orthopaedic Surgery | Admitting: Orthopaedic Surgery

## 2019-08-12 ENCOUNTER — Other Ambulatory Visit: Payer: Self-pay

## 2019-08-12 DIAGNOSIS — M5136 Other intervertebral disc degeneration, lumbar region: Secondary | ICD-10-CM | POA: Diagnosis not present

## 2019-08-12 DIAGNOSIS — M4696 Unspecified inflammatory spondylopathy, lumbar region: Secondary | ICD-10-CM | POA: Diagnosis not present

## 2019-08-12 DIAGNOSIS — M5030 Other cervical disc degeneration, unspecified cervical region: Secondary | ICD-10-CM | POA: Diagnosis not present

## 2019-08-12 DIAGNOSIS — M5126 Other intervertebral disc displacement, lumbar region: Secondary | ICD-10-CM | POA: Diagnosis not present

## 2019-08-12 DIAGNOSIS — M48061 Spinal stenosis, lumbar region without neurogenic claudication: Secondary | ICD-10-CM | POA: Diagnosis not present

## 2019-08-12 DIAGNOSIS — M4316 Spondylolisthesis, lumbar region: Secondary | ICD-10-CM | POA: Diagnosis not present

## 2019-08-12 DIAGNOSIS — M542 Cervicalgia: Secondary | ICD-10-CM | POA: Diagnosis not present

## 2019-08-15 DIAGNOSIS — M5412 Radiculopathy, cervical region: Secondary | ICD-10-CM | POA: Diagnosis not present

## 2019-08-16 ENCOUNTER — Telehealth: Payer: Self-pay

## 2019-08-16 MED ORDER — FLUOCINONIDE 0.05 % EX CREA: 1.0000 "application " | TOPICAL_CREAM | Freq: Two times a day (BID) | CUTANEOUS | 0 refills | Status: DC | PRN

## 2019-08-16 NOTE — Telephone Encounter (Signed)
Sent. Thanks.   

## 2019-08-16 NOTE — Addendum Note (Signed)
Addended by: Tonia Ghent on: 08/16/2019 04:51 PM   Modules accepted: Orders

## 2019-08-16 NOTE — Telephone Encounter (Signed)
Patient advised. Patient has left a message at dermatology clinic which is closed on Friday but they should get the message on Monday.  Please refill the cream if you would.

## 2019-08-16 NOTE — Telephone Encounter (Signed)
Patient called stating she would like to get tests done to check for mold in her system. She states her apartment has black mold in the carpet and this has been tested/confirmed last Brooke Bonito is working on getting that report to have for herself. Patient states she has talked to Dr Damita Dunnings about this before and he gave her cream for cracked skin she was having due to the mold. Cream helped temporarily but because she still lives with mold situation her skin is cracking open again. She also left a message with her dermatologist about this but have not heard from them yet. Does patient need an appointment for this again? CB: [phone]

## 2019-08-16 NOTE — Telephone Encounter (Signed)
We don't have a test that would check her for mold.   I think it makes sense to see derm about the cracking in her skin.   I can resend the rx as a temporary measure if needed.  Let me know.  Thanks.

## 2019-08-19 ENCOUNTER — Telehealth: Payer: Self-pay | Admitting: Family Medicine

## 2019-08-19 DIAGNOSIS — L989 Disorder of the skin and subcutaneous tissue, unspecified: Secondary | ICD-10-CM

## 2019-08-19 NOTE — Telephone Encounter (Signed)
Patient called requesting a referral. She stated she is really needing to see a dermatologist.  Patient would like to see Dr Karleen Hampshire in [facility] and wanted to see if you could do this referral for her.   Patient's Call Back # [phone]

## 2019-08-20 NOTE — Telephone Encounter (Signed)
I put in the referral.  Thanks.  

## 2019-08-21 ENCOUNTER — Telehealth: Payer: Self-pay

## 2019-08-21 ENCOUNTER — Telehealth: Payer: Self-pay | Admitting: Family Medicine

## 2019-08-21 DIAGNOSIS — L503 Dermatographic urticaria: Secondary | ICD-10-CM | POA: Diagnosis not present

## 2019-08-21 DIAGNOSIS — K1379 Other lesions of oral mucosa: Secondary | ICD-10-CM

## 2019-08-21 MED ORDER — NON FORMULARY: 5.0000 mL | Freq: Four times a day (QID) | Status: DC | PRN

## 2019-08-21 NOTE — Telephone Encounter (Signed)
Patient left a voicemail stating that she had called earlier about the [facility]'s mouthwash. Patient stated that she had just seen Dr. Karleen Hampshire and she told her that all she could do for her is give her a topical solution and pills. Patient stated that she told her that she needs to see her neurologist again. Tried to call patient back to see what we can do for her, but did not get an answer or answering machine. Unable to leave a message.

## 2019-08-21 NOTE — Telephone Encounter (Signed)
This rx was intended for oral discomfort, not any mold related issue.  Please see what details you can get from patient or her daughter about her current situation.  Thanks.

## 2019-08-21 NOTE — Telephone Encounter (Signed)
rx resent- please make sure this went through.  Thanks.

## 2019-08-21 NOTE — Telephone Encounter (Signed)
Pt left v/m requesting cb with date when first prescribed Duke's mouthwash due to the black mold in pts body. Per hx med list appears first time [facility]'s mouthwash was prescribed was 10/26/2012.per v/m left detailed v/m with date 10/26/2012 first time prescribing Dukes mouthwash. Pt to cb if anything further needed. FYI to Dr Damita Dunnings.

## 2019-08-22 ENCOUNTER — Other Ambulatory Visit: Payer: Self-pay | Admitting: *Deleted

## 2019-08-22 ENCOUNTER — Other Ambulatory Visit: Payer: Self-pay | Admitting: Family Medicine

## 2019-08-22 DIAGNOSIS — M5136 Other intervertebral disc degeneration, lumbar region: Secondary | ICD-10-CM | POA: Diagnosis not present

## 2019-08-22 DIAGNOSIS — M5030 Other cervical disc degeneration, unspecified cervical region: Secondary | ICD-10-CM | POA: Diagnosis not present

## 2019-08-23 DIAGNOSIS — F22 Delusional disorders: Secondary | ICD-10-CM | POA: Diagnosis not present

## 2019-08-23 DIAGNOSIS — N390 Urinary tract infection, site not specified: Secondary | ICD-10-CM | POA: Diagnosis not present

## 2019-08-23 DIAGNOSIS — Z20828 Contact with and (suspected) exposure to other viral communicable diseases: Secondary | ICD-10-CM | POA: Diagnosis not present

## 2019-08-23 DIAGNOSIS — E039 Hypothyroidism, unspecified: Secondary | ICD-10-CM | POA: Diagnosis not present

## 2019-08-23 DIAGNOSIS — R410 Disorientation, unspecified: Secondary | ICD-10-CM | POA: Diagnosis not present

## 2019-08-23 DIAGNOSIS — G20 Parkinson's disease: Secondary | ICD-10-CM | POA: Diagnosis not present

## 2019-08-23 DIAGNOSIS — R451 Restlessness and agitation: Secondary | ICD-10-CM | POA: Diagnosis not present

## 2019-08-23 DIAGNOSIS — G8929 Other chronic pain: Secondary | ICD-10-CM | POA: Diagnosis not present

## 2019-08-23 DIAGNOSIS — F319 Bipolar disorder, unspecified: Secondary | ICD-10-CM | POA: Diagnosis not present

## 2019-08-23 DIAGNOSIS — Z79899 Other long term (current) drug therapy: Secondary | ICD-10-CM | POA: Diagnosis not present

## 2019-08-23 DIAGNOSIS — R0602 Shortness of breath: Secondary | ICD-10-CM | POA: Diagnosis not present

## 2019-08-23 DIAGNOSIS — B9620 Unspecified Escherichia coli [E. coli] as the cause of diseases classified elsewhere: Secondary | ICD-10-CM | POA: Diagnosis not present

## 2019-08-23 DIAGNOSIS — L299 Pruritus, unspecified: Secondary | ICD-10-CM | POA: Diagnosis not present

## 2019-08-23 MED ORDER — FIRST-DUKES MOUTHWASH MT SUSP: 5.0000 mL | Freq: Four times a day (QID) | OROMUCOSAL | 0 refills | Status: DC | PRN

## 2019-08-23 NOTE — Telephone Encounter (Signed)
Noted, will await the consult report.

## 2019-08-23 NOTE — Telephone Encounter (Signed)
Left detailed message on voicemail.  

## 2019-08-23 NOTE — Telephone Encounter (Signed)
Pt returned call and is aware mouthwash was sent to pharmacy... pt states she has appt with Neurology Dr Tillman Sers at [facility]

## 2019-08-23 NOTE — Telephone Encounter (Signed)
Sent. Thanks.   

## 2019-08-24 DIAGNOSIS — L299 Pruritus, unspecified: Secondary | ICD-10-CM | POA: Diagnosis not present

## 2019-08-24 DIAGNOSIS — F319 Bipolar disorder, unspecified: Secondary | ICD-10-CM | POA: Diagnosis not present

## 2019-08-24 DIAGNOSIS — E785 Hyperlipidemia, unspecified: Secondary | ICD-10-CM | POA: Diagnosis not present

## 2019-08-24 DIAGNOSIS — F22 Delusional disorders: Secondary | ICD-10-CM | POA: Diagnosis not present

## 2019-08-24 DIAGNOSIS — K219 Gastro-esophageal reflux disease without esophagitis: Secondary | ICD-10-CM | POA: Diagnosis not present

## 2019-08-24 DIAGNOSIS — E079 Disorder of thyroid, unspecified: Secondary | ICD-10-CM | POA: Diagnosis not present

## 2019-08-24 DIAGNOSIS — Z882 Allergy status to sulfonamides status: Secondary | ICD-10-CM | POA: Diagnosis not present

## 2019-08-24 DIAGNOSIS — K3184 Gastroparesis: Secondary | ICD-10-CM | POA: Diagnosis not present

## 2019-08-24 DIAGNOSIS — Z79899 Other long term (current) drug therapy: Secondary | ICD-10-CM | POA: Diagnosis not present

## 2019-08-24 DIAGNOSIS — Z885 Allergy status to narcotic agent status: Secondary | ICD-10-CM | POA: Diagnosis not present

## 2019-08-24 DIAGNOSIS — N390 Urinary tract infection, site not specified: Secondary | ICD-10-CM | POA: Diagnosis not present

## 2019-08-24 DIAGNOSIS — G20 Parkinson's disease: Secondary | ICD-10-CM | POA: Diagnosis not present

## 2019-08-24 DIAGNOSIS — E039 Hypothyroidism, unspecified: Secondary | ICD-10-CM | POA: Diagnosis not present

## 2019-08-24 DIAGNOSIS — F17210 Nicotine dependence, cigarettes, uncomplicated: Secondary | ICD-10-CM | POA: Diagnosis not present

## 2019-08-24 DIAGNOSIS — L298 Other pruritus: Secondary | ICD-10-CM | POA: Diagnosis not present

## 2019-08-24 DIAGNOSIS — R600 Localized edema: Secondary | ICD-10-CM | POA: Diagnosis not present

## 2019-08-24 DIAGNOSIS — Z881 Allergy status to other antibiotic agents status: Secondary | ICD-10-CM | POA: Diagnosis not present

## 2019-08-24 DIAGNOSIS — Z886 Allergy status to analgesic agent status: Secondary | ICD-10-CM | POA: Diagnosis not present

## 2019-08-27 ENCOUNTER — Encounter: Payer: Self-pay | Admitting: Emergency Medicine

## 2019-08-27 ENCOUNTER — Emergency Department
Admission: EM | Admit: 2019-08-27 | Discharge: 2019-08-28 | Disposition: A | Discharge status: Other Acute Inpatient Hospital | Payer: Medicare Other | Attending: Emergency Medicine | Admitting: Emergency Medicine

## 2019-08-27 ENCOUNTER — Other Ambulatory Visit: Payer: Self-pay

## 2019-08-27 DIAGNOSIS — E039 Hypothyroidism, unspecified: Secondary | ICD-10-CM | POA: Diagnosis not present

## 2019-08-27 DIAGNOSIS — Z79899 Other long term (current) drug therapy: Secondary | ICD-10-CM | POA: Diagnosis not present

## 2019-08-27 DIAGNOSIS — G20 Parkinson's disease: Secondary | ICD-10-CM | POA: Insufficient documentation

## 2019-08-27 DIAGNOSIS — F329 Major depressive disorder, single episode, unspecified: Secondary | ICD-10-CM | POA: Diagnosis not present

## 2019-08-27 DIAGNOSIS — R0902 Hypoxemia: Secondary | ICD-10-CM | POA: Diagnosis not present

## 2019-08-27 DIAGNOSIS — Z85828 Personal history of other malignant neoplasm of skin: Secondary | ICD-10-CM | POA: Diagnosis not present

## 2019-08-27 DIAGNOSIS — Z20828 Contact with and (suspected) exposure to other viral communicable diseases: Secondary | ICD-10-CM | POA: Insufficient documentation

## 2019-08-27 DIAGNOSIS — F22 Delusional disorders: Secondary | ICD-10-CM | POA: Diagnosis not present

## 2019-08-27 DIAGNOSIS — R404 Transient alteration of awareness: Secondary | ICD-10-CM | POA: Diagnosis not present

## 2019-08-27 DIAGNOSIS — Z03818 Encounter for observation for suspected exposure to other biological agents ruled out: Secondary | ICD-10-CM | POA: Diagnosis not present

## 2019-08-27 DIAGNOSIS — R000 Tachycardia, unspecified: Secondary | ICD-10-CM | POA: Diagnosis not present

## 2019-08-27 DIAGNOSIS — F310 Bipolar disorder, current episode hypomanic: Secondary | ICD-10-CM | POA: Diagnosis present

## 2019-08-27 DIAGNOSIS — I10 Essential (primary) hypertension: Secondary | ICD-10-CM | POA: Diagnosis not present

## 2019-08-27 LAB — CBC WITH DIFFERENTIAL/PLATELET
Abs Immature Granulocytes: 0.17 10*3/uL — ABNORMAL HIGH (ref 0.00–0.07)
Basophils Absolute: 0.1 10*3/uL (ref 0.0–0.1)
Basophils Relative: 0 %
Eosinophils Absolute: 0 10*3/uL (ref 0.0–0.5)
Eosinophils Relative: 0 %
HCT: 42.9 % (ref 36.0–46.0)
Hemoglobin: 14.3 g/dL (ref 12.0–15.0)
Immature Granulocytes: 1 %
Lymphocytes Relative: 13 %
Lymphs Abs: 3.3 10*3/uL (ref 0.7–4.0)
MCH: 32.1 pg (ref 26.0–34.0)
MCHC: 33.3 g/dL (ref 30.0–36.0)
MCV: 96.4 fL (ref 80.0–100.0)
Monocytes Absolute: 1.5 10*3/uL — ABNORMAL HIGH (ref 0.1–1.0)
Monocytes Relative: 6 %
Neutro Abs: 20 10*3/uL — ABNORMAL HIGH (ref 1.7–7.7)
Neutrophils Relative %: 80 %
Platelets: 299 10*3/uL (ref 150–400)
RBC: 4.45 MIL/uL (ref 3.87–5.11)
RDW: 13.3 % (ref 11.5–15.5)
Smear Review: NORMAL
WBC: 25.1 10*3/uL — ABNORMAL HIGH (ref 4.0–10.5)
nRBC: 0 % (ref 0.0–0.2)

## 2019-08-27 LAB — URINALYSIS, COMPLETE (UACMP) WITH MICROSCOPIC
Bacteria, UA: NONE SEEN
Bilirubin Urine: NEGATIVE
Glucose, UA: NEGATIVE mg/dL
Hgb urine dipstick: NEGATIVE
Ketones, ur: NEGATIVE mg/dL
Leukocytes,Ua: NEGATIVE
Nitrite: NEGATIVE
Protein, ur: NEGATIVE mg/dL
Specific Gravity, Urine: 1.001 — ABNORMAL LOW (ref 1.005–1.030)
Squamous Epithelial / HPF: NONE SEEN (ref 0–5)
WBC, UA: NONE SEEN WBC/hpf (ref 0–5)
pH: 8 (ref 5.0–8.0)

## 2019-08-27 LAB — URINE DRUG SCREEN, QUALITATIVE (ARMC ONLY)
Amphetamines, Ur Screen: NOT DETECTED
Barbiturates, Ur Screen: NOT DETECTED
Benzodiazepine, Ur Scrn: NOT DETECTED
Cannabinoid 50 Ng, Ur ~~LOC~~: POSITIVE — AB
Cocaine Metabolite,Ur ~~LOC~~: NOT DETECTED
MDMA (Ecstasy)Ur Screen: NOT DETECTED
Methadone Scn, Ur: NOT DETECTED
Opiate, Ur Screen: NOT DETECTED
Phencyclidine (PCP) Ur S: NOT DETECTED
Tricyclic, Ur Screen: NOT DETECTED

## 2019-08-27 LAB — COMPREHENSIVE METABOLIC PANEL
ALT: 16 U/L (ref 0–44)
AST: 21 U/L (ref 15–41)
Albumin: 4.3 g/dL (ref 3.5–5.0)
Alkaline Phosphatase: 103 U/L (ref 38–126)
Anion gap: 10 (ref 5–15)
BUN: 18 mg/dL (ref 6–20)
CO2: 30 mmol/L (ref 22–32)
Calcium: 10.8 mg/dL — ABNORMAL HIGH (ref 8.9–10.3)
Chloride: 100 mmol/L (ref 98–111)
Creatinine, Ser: 0.89 mg/dL (ref 0.44–1.00)
GFR calc Af Amer: >60 mL/min (ref >60)
GFR calc non Af Amer: >60 mL/min (ref >60)
Glucose, Bld: 141 mg/dL — ABNORMAL HIGH (ref 70–99)
Potassium: 3.9 mmol/L (ref 3.5–5.1)
Sodium: 140 mmol/L (ref 135–145)
Total Bilirubin: 0.4 mg/dL (ref 0.3–1.2)
Total Protein: 7.7 g/dL (ref 6.5–8.1)

## 2019-08-27 LAB — T4, FREE: Free T4: 0.86 ng/dL (ref 0.61–1.12)

## 2019-08-27 LAB — ACETAMINOPHEN LEVEL: Acetaminophen (Tylenol), Serum: <10 ug/mL — ABNORMAL LOW (ref 10–30)

## 2019-08-27 LAB — TSH: TSH: 2.476 u[IU]/mL (ref 0.350–4.500)

## 2019-08-27 LAB — ETHANOL: Alcohol, Ethyl (B): <10 mg/dL (ref <10)

## 2019-08-27 MED ORDER — LORAZEPAM 1 MG PO TABS
1.0000 mg | ORAL_TABLET | Freq: Once | ORAL | Status: AC
  Administered 2019-08-27: 19:00:00 1 mg via ORAL
  Filled 2019-08-27: qty 1

## 2019-08-27 MED ORDER — ONDANSETRON 4 MG PO TBDP
4.0000 mg | ORAL_TABLET | Freq: Once | ORAL | Status: AC
  Administered 2019-08-27: 4 mg via ORAL
  Filled 2019-08-27: qty 1

## 2019-08-27 NOTE — ED Provider Notes (Signed)
[facility] Emergency Department Provider Note   ____________________________________________    I have reviewed the triage vital signs and the nursing notes.   HISTORY  Chief Complaint Paranoid     HPI Leslie Wong is a 63 y.o. female who presents for evaluation.  Patient appears to have a fixed delusion that she is contaminated by living [place] which is apparently excreting toxic waste into her body and she is now emitting black mold.  She is quite insistent this is what is causing her chronic pain and would like [name] to contact the CDC and test for toxic waste.  She is quite agitated  Past Medical History:  Diagnosis Date  . Back pain   . Cancer ([place])    skin  . Depression    BAD  . Gastroparesis   . GERD (gastroesophageal reflux disease)   . Hypothyroidism   . Insomnia   . Migraines   . Parkinson disease ([place])    per [place] [facility], dx'd 2019  . Recurrent cold sores   . Shoulder bursitis   . Urge incontinence     Patient Active Problem List   Diagnosis Date Noted  . Cannabis abuse 05/28/2019  . Bipolar 1 disorder, mixed, severe ([place]) 05/27/2019  . Opiate dependence ([place]) 04/25/2019  . Cracking skin 01/27/2019  . Delusion ([place]) 09/26/2018  . Night sweats 05/13/2018  . Pupil asymmetry 05/13/2018  . DDD (degenerative disc disease), cervical 04/23/2018  . Parkinson disease ([place])   . HLD (hyperlipidemia) 03/10/2016  . Advance care planning 03/10/2016  . Cervical radiculopathy 07/30/2015  . Osteopenia 04/18/2014  . Medicare annual wellness visit, subsequent 03/18/2014  . Shoulder pain 01/17/2013  . Chronic back pain 09/14/2011  . Hypercalcemia 03/25/2010  . INCONTINENCE, URGE 04/16/2009  . CARPAL TUNNEL SYNDROME, BILATERAL 09/24/2008  . URETHRAL STRICTURE 06/13/2007  . Hypothyroidism 06/05/2007  . Bipolar affective disorder, current episode hypomanic ([place]) 06/05/2007  . MIGRAINE HEADACHE 06/05/2007  .  Gastroesophageal reflux disease with hiatal hernia 06/05/2007  . IRRITABLE BOWEL SYNDROME 06/05/2007  . FIBROCYSTIC BREAST DISEASE 06/05/2007    Past Surgical History:  Procedure Laterality Date  . ABDOMINAL HYSTERECTOMY  1999   total  . APPENDECTOMY    . ESOPHAGOGASTRODUODENOSCOPY  01/2006   negative except small hiatal hernia  . ESOPHAGOGASTRODUODENOSCOPY  02/24/2015   See report  . LAPAROSCOPY     for endometriosis  . OVARIAN CYST REMOVAL  1990   unilateral  . TONSILLECTOMY AND ADENOIDECTOMY      Prior to Admission medications   Medication Sig Start Date End Date Taking? Authorizing Provider  albuterol (VENTOLIN HFA) 108 (90 Base) MCG/ACT inhaler Inhale 2 puffs into the lungs every 6 (six) hours as needed for wheezing or shortness of breath. 07/25/19   Tonia Ghent, MD  ascorbic acid (VITAMIN C) 500 MG tablet Take 1 tablet (500 mg total) by mouth daily. 07/25/19   Tonia Ghent, MD  cholecalciferol (VITAMIN D) 25 MCG (1000 UT) tablet Take 1 tablet (1,000 Units total) by mouth daily. 07/25/19   Tonia Ghent, MD  Diphenhyd-Hydrocort-Nystatin (FIRST-DUKES MOUTHWASH) SUSP Use as directed 5 mLs in the mouth or throat 4 (four) times daily as needed. 08/23/19   Tonia Ghent, MD  fluocinonide cream (LIDEX) [id number] % Apply 1 application topically 2 (two) times daily as needed. 08/16/19   Tonia Ghent, MD  hydrocerin (EUCERIN) CREA Apply 1 application topically 2 (two) times daily. 04/29/19  Patrecia Pour, NP  hydrOXYzine (ATARAX/VISTARIL) 50 MG tablet Take 1 tablet (50 mg total) by mouth every 6 (six) hours as needed. 07/11/19   Tonia Ghent, MD  lamoTRIgine (LAMICTAL) 25 MG tablet Take 2 tablets (50 mg total) by mouth daily. 05/30/19   [name], [name], MD  levothyroxine (SYNTHROID) 88 MCG tablet Take 1 tablet (88 mcg total) by mouth daily before breakfast. 07/11/19   Tonia Ghent, MD  Multiple Vitamin (MULTIVITAMIN) tablet Take 1 tablet by mouth daily. 07/25/19   Tonia Ghent, MD  Omega-3 Fatty Acids (FISH OIL) 1000 MG CAPS Take 1 capsule (1,000 mg total) by mouth daily. 07/25/19   Tonia Ghent, MD  omeprazole (PRILOSEC) 20 MG capsule Take 1 capsule (20 mg total) by mouth daily. 07/25/19   Tonia Ghent, MD  ondansetron (ZOFRAN-ODT) 4 MG disintegrating tablet Take 1 tablet (4 mg total) by mouth 3 (three) times daily as needed for nausea or vomiting. 07/26/19   Tonia Ghent, MD  oxyCODONE (ROXICODONE) 5 MG immediate release tablet Take 1 tablet (5 mg total) by mouth every 4 (four) hours as needed. 06/14/19 06/13/20  Tonia Ghent, MD  propranolol (INDERAL) 10 MG tablet Take 1 tablet (10 mg total) by mouth 2 (two) times daily. 07/25/19   Tonia Ghent, MD  senna-docusate (SENOKOT-S) 8.6-50 MG tablet Take 2 tablets by mouth daily. 07/25/19   Tonia Ghent, MD  tiZANidine (ZANAFLEX) 4 MG tablet Take 1 tablet (4 mg total) by mouth every 8 (eight) hours as needed for muscle spasms. 07/11/19   Tonia Ghent, MD  valACYclovir (VALTREX) 500 MG tablet TAKE 1 TABLET(500 MG) BY MOUTH TWICE DAILY 07/23/19   Tonia Ghent, MD  vitamin B-12 (CYANOCOBALAMIN) 1000 MCG tablet Take 1 tablet (1,000 mcg total) by mouth daily. 07/25/19   Tonia Ghent, MD     Allergies Valproic acid, Aspirin, Atorvastatin, Baclofen, Codeine, Duloxetine, Erythromycin, Gabapentin, Metoclopramide hcl, Nortriptyline, Nsaids, Nucynta er [tapentadol hcl er], Nucynta [tapentadol], Prednisone, Pregabalin, Seroquel [quetiapine fumerate], Sulfa antibiotics, Topamax, and Vicodin [hydrocodone-acetaminophen]  Family History  Problem Relation Age of Onset  . Hypertension Mother   . Arthritis Mother   . Diabetes Mother   . Stroke Mother   . Cancer Father        lung  . Colon cancer Neg Hx   . Breast cancer Neg Hx     Social History Social History   Tobacco Use  . Smoking status: Current Some Day Smoker    Packs/day: 1.00    Years: 42.50    Pack years: 42.50    Types: Cigarettes   . Smokeless tobacco: Former Network engineer Use Topics  . Alcohol use: No    Alcohol/week: 0.0 standard drinks  . Drug use: No    Review of Systems  Constitutional: Burning sensation in her body Eyes: No visual changes.  ENT: No sore throat. Cardiovascular: Denies chest pain. Respiratory: Denies shortness of breath. Gastrointestinal: No abdominal pain.  Genitourinary: Some dysuria Musculoskeletal: Chronic pain Skin: Negative for rash. Neurological: Negative for headaches    ____________________________________________   PHYSICAL EXAM:  VITAL SIGNS: ED Triage Vitals  Enc Vitals Group     BP 08/27/19 1249 (!) 144/86     Pulse Rate 08/27/19 1249 (!) 103     Resp 08/27/19 1249 20     Temp 08/27/19 1249 99.3 F (37.4 C)     Temp Source 08/27/19 1249 Oral  SpO2 08/27/19 1249 98 %     Weight --      Height --      Head Circumference --      Peak Flow --      Pain Score 08/27/19 1254 10     Pain Loc --      Pain Edu? --      Excl. in [place]? --     Constitutional: Alert and oriented.  Eyes: Conjunctivae are normal.   Nose: No congestion/rhinnorhea. Mouth/Throat: Mucous membranes are moist.    Cardiovascular: Normal rate, regular rhythm.  Good peripheral circulation. Respiratory: Normal respiratory effort.  No retractions. Lungs CTAB. Gastrointestinal: Soft and nontender. No distention.    Musculoskeletal:  Warm and well perfused Neurologic:  Normal speech and language. No gross focal neurologic deficits are appreciated.  Skin:  Skin is warm, dry and intact. No rash noted. Psychiatric: Agitated, pressured speech.  Focused on toxic waste/black mold but does not appear to be acutely danger to herself  ____________________________________________   LABS (all labs ordered are listed, but only abnormal results are displayed)  Labs Reviewed  CBC WITH DIFFERENTIAL/PLATELET - Abnormal; Notable for the following components:      Result Value   WBC 25.1 (*)     Neutro Abs 20.0 (*)    Monocytes Absolute 1.5 (*)    Abs Immature Granulocytes 0.17 (*)    All other components within normal limits  COMPREHENSIVE METABOLIC PANEL - Abnormal; Notable for the following components:   Glucose, Bld 141 (*)    Calcium 10.8 (*)    All other components within normal limits  ACETAMINOPHEN LEVEL - Abnormal; Notable for the following components:   Acetaminophen (Tylenol), Serum <10 (*)    All other components within normal limits  URINE DRUG SCREEN, QUALITATIVE ([facility] ONLY) - Abnormal; Notable for the following components:   Cannabinoid 50 Ng, Ur [place] POSITIVE (*)    All other components within normal limits  URINALYSIS, COMPLETE (UACMP) WITH MICROSCOPIC - Abnormal; Notable for the following components:   Color, Urine COLORLESS (*)    APPearance CLEAR (*)    Specific Gravity, Urine 1.001 (*)    All other components within normal limits  ETHANOL   ____________________________________________  EKG  None ____________________________________________  RADIOLOGY  None ____________________________________________   PROCEDURES  Procedure(s) performed: No  Procedures   Critical Care performed: No ____________________________________________   INITIAL IMPRESSION / ASSESSMENT AND PLAN / ED COURSE  Pertinent labs & imaging results that were available during my care of the patient were reviewed by me and considered in my medical decision making (see chart for details).  Patient presents with agitation, anxiety, delusional disorder.  Her lab work is significant for elevated white blood cell count but she has no infectious symptoms, initially complained of some dysuria but she no longer does complain of that and her urinalysis is unremarkable.  I suspect her white blood cell count is elevated secondary to her extreme agitation.  I will consult TTS and psychiatry I believe she is medically cleared at this stage.     ____________________________________________   FINAL CLINICAL IMPRESSION(S) / ED DIAGNOSES  Final diagnoses:  Delusional disorder ([place])        Note:  This document was prepared using Dragon voice recognition software and may include unintentional dictation errors.   Lavonia Drafts, MD 08/27/19 1524
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
## 2019-08-27 NOTE — ED Notes (Signed)
Pt  vol

## 2019-08-27 NOTE — ED Notes (Addendum)
Pt given dinner tray. Pt stated she was unable to eat the tray and has not been able to eat in the past two weeks. She has thrush from the toxins and needs jello. Pt notified that we do not have jello in the er and that all we have is apple sauce to which she declined.

## 2019-08-27 NOTE — ED Notes (Addendum)
Pt given Ativan PO to help ease anxiety. Pt wants to see the doctor because she believes she is dying.  Requested to be submersed in water to release the toxins from her body. Maintained on 15 minute checks.

## 2019-08-27 NOTE — ED Notes (Signed)
Pt took a prescription pill from her belongings as she was settling onto her bed (Marcobid).  Marya Amsler and Dr. Corky Downs made aware.
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
## 2019-08-27 NOTE — ED Notes (Signed)
Patient continues to state, "My body is full of toxic waste and needs to be flushed of it.I need an IV and some fluids and CDC needs to be called."

## 2019-08-27 NOTE — ED Notes (Signed)
Pt moved from hallway to [place] [address], Pt dressed out into hospital scrubs by this NT and Amy RN. Pt belongings include: Camo leggings Green Shirt Tan bra Hair tie 3 Hair clips  Flip flops Two necklaces 2 pair of earrings 4 rings Red personal bag

## 2019-08-27 NOTE — ED Notes (Signed)
Pt wanting an IV because she is "severly dehydrated."  Pt wanting this Probation officer to declare a state of emergency and call the [employer] and the CDC. Patient's labs reviewed by EDP.

## 2019-08-27 NOTE — BH Assessment (Signed)
Assessment Note  Leslie Wong is an 63 y.o. female who presents to ED with thoughts that her living situation is toxic. Pt reports "There is toxic poison where I live and my body is full of the waste they dumped on the property. The whole property is contaminated - it has been a living hell". Pt believes she has a "very bad UTI because of all the toxins in my body".   Pt denies SI/HI/AVH. Pt lives alone but her daughter Nancy Nordmann: [id number] is her primary natural support.  Diagnosis: Bipolar 1 Disorder, by history  Past Medical History:  Past Medical History:  Diagnosis Date  . Back pain   . Cancer ([place])    skin  . Depression    BAD  . Gastroparesis   . GERD (gastroesophageal reflux disease)   . Hypothyroidism   . Insomnia   . Migraines   . Parkinson disease ([place])    per [facility], dx'd 2019  . Recurrent cold sores   . Shoulder bursitis   . Urge incontinence     Past Surgical History:  Procedure Laterality Date  . ABDOMINAL HYSTERECTOMY  1999   total  . APPENDECTOMY    . ESOPHAGOGASTRODUODENOSCOPY  01/2006   negative except small hiatal hernia  . ESOPHAGOGASTRODUODENOSCOPY  02/24/2015   See report  . LAPAROSCOPY     for endometriosis  . OVARIAN CYST REMOVAL  1990   unilateral  . TONSILLECTOMY AND ADENOIDECTOMY      Family History:  Family History  Problem Relation Age of Onset  . Hypertension Mother   . Arthritis Mother   . Diabetes Mother   . Stroke Mother   . Cancer Father        lung  . Colon cancer Neg Hx   . Breast cancer Neg Hx     Social History:  reports that she has been smoking cigarettes. She has a 42.50 pack-year smoking history. She has quit using smokeless tobacco. She reports that she does not drink alcohol or use drugs.  Additional Social History:  Alcohol / Drug Use Pain Medications: See MAR Prescriptions: See MAR Over the Counter: See MAR History of alcohol / drug use?: Yes Longest period of sobriety (when/how  long): [facility] Negative Consequences of Use: (None) Withdrawal Symptoms: (Denied) Substance #1 Name of Substance 1: Cannabis 1 - Age of First Use: Unable to quantify 1 - Amount (size/oz): Unable to quantify 1 - Frequency: Unable to quantify 1 - Duration: Unable to quantify 1 - Last Use / Amount: [facility]  CIWA: CIWA-Ar BP: (!) 144/86 Pulse Rate: (!) 103 COWS:    Allergies:  Allergies  Allergen Reactions  . Valproic Acid Shortness Of Breath  . Aspirin     REACTION: UNSPECIFIED  . Atorvastatin Other (See Comments)    Tongue swelling, legs cramping  . Baclofen     REACTION: Throat swells up  . Codeine     REACTION: UNSPECIFIED  . Duloxetine Other (See Comments)    Vision loss, weight loss  . Erythromycin     REACTION: UNSPECIFIED  . Gabapentin     REACTION: throat swells up  . Metoclopramide Hcl     REACTION: states "messed me up"  . Nortriptyline     Other reaction(s): Other (See Comments) Vision issues  . Nsaids Other (See Comments)    Stomach problems  . Nucynta Er [Tapentadol Hcl Er]     Intolerant, see note from 07/24/12.    Gean [place] [Tapentadol]  AMS  . Prednisone     GI intolance  . Pregabalin   . Seroquel [Quetiapine Fumerate] Other (See Comments)    Loss control, felt like on another planet  . Sulfa Antibiotics Nausea And Vomiting  . Topamax Other (See Comments)    Blisters in mouth and tongue  . Vicodin [Hydrocodone-Acetaminophen]     Nausea, thrush and constipation    Home Medications: (Not in a hospital admission)   OB/GYN Status:  No LMP recorded. Patient has had a hysterectomy.  General Assessment Data Location of Assessment: [facility] ED TTS Assessment: In system Is this a Tele or Face-to-Face Assessment?: Face-to-Face Is this an Initial Assessment or a Re-assessment for this encounter?: Initial Assessment Patient Accompanied by:: N/A Language Other than English: No Living Arrangements: Other (Comment)(Private Residence) What gender do you  identify as?: Female Marital status: Single Maiden name: Myer Haff Pregnancy Status: No Living Arrangements: Alone Can pt return to current living arrangement?: Yes Admission Status: Voluntary Is patient capable of signing voluntary admission?: Yes Referral Source: Self/Family/Friend Insurance type: Medicare  Medical Screening Exam ([place]) Medical Exam completed: Yes  Crisis Care Plan Living Arrangements: Alone Legal Guardian: Other:(Self) Name of Psychiatrist: [place] Name of Therapist: [facility]  Education Status Is patient currently in school?: No Is the patient employed, unemployed or receiving disability?: Receiving disability income  Risk to self with the past 6 months Suicidal Ideation: No Has patient been a risk to self within the past 6 months prior to admission? : No Suicidal Intent: No Has patient had any suicidal intent within the past 6 months prior to admission? : No Is patient at risk for suicide?: No Suicidal Plan?: No Has patient had any suicidal plan within the past 6 months prior to admission? : No Access to Means: No What has been your use of drugs/alcohol within the last 12 months?: Cannabis Previous Attempts/Gestures: No How many times?: 0 Other Self Harm Risks: None Triggers for Past Attempts: None known Intentional Self Injurious Behavior: None Family Suicide History: Unknown Recent stressful life event(s): Other (Comment)(Living situation) Persecutory voices/beliefs?: Yes Depression: No Depression Symptoms: (None) Substance abuse history and/or treatment for substance abuse?: Yes Suicide prevention information given to non-admitted patients: Not applicable  Risk to Others within the past 6 months Homicidal Ideation: No Does patient have any lifetime risk of violence toward others beyond the six months prior to admission? : No Thoughts of Harm to Others: No Current Homicidal Intent: No Current Homicidal Plan: No Access to Homicidal Means:  No Identified Victim: None History of harm to others?: No Assessment of Violence: None Noted Violent Behavior Description: None Does patient have access to weapons?: No Criminal Charges Pending?: No Does patient have a court date: No Is patient on probation?: No  Psychosis Hallucinations: None noted Delusions: None noted  Mental Status Report Appearance/Hygiene: Disheveled Eye Contact: Poor Motor Activity: Gait exaggerated, Rigidity, Shuffling, Restlessness Speech: Rapid Level of Consciousness: Alert Mood: Fearful Affect: Unable to Assess Anxiety Level: Severe Thought Processes: Coherent, Relevant Judgement: Partial Orientation: Person, Place, Time, Situation Obsessive Compulsive Thoughts/Behaviors: Severe  Cognitive Functioning Concentration: Fair Memory: Recent Intact, Remote Intact Is patient IDD: No Insight: see judgement above Impulse Control: Fair Appetite: Fair Have you had any weight changes? : No Change Sleep: Decreased Total Hours of Sleep: 5 Vegetative Symptoms: None  ADLScreening [facility] Assessment Services) Patient's cognitive ability adequate to safely complete daily activities?: Yes Patient able to express need for assistance with ADLs?: Yes Independently performs ADLs?: Yes (appropriate for  developmental age)  Prior Inpatient Therapy Prior Inpatient Therapy: Yes Prior Therapy Dates: 05/2019 Prior Therapy Facilty/Provider(s): [facility] Reason for Treatment: Similar to current admission  Prior Outpatient Therapy Prior Outpatient Therapy: No([facility]) Does patient have an ACCT team?: No Does patient have Intensive In-House Services?  : No Does patient have [facility] services? : No Does patient have P4CC services?: No  ADL Screening (condition at time of admission) Patient's cognitive ability adequate to safely complete daily activities?: Yes Patient able to express need for assistance with ADLs?: Yes Independently performs ADLs?: Yes (appropriate for  developmental age)       Abuse/Neglect Assessment (Assessment to be complete while patient is alone) Abuse/Neglect Assessment Can Be Completed: Yes Physical Abuse: Denies Verbal Abuse: Denies Sexual Abuse: Denies Exploitation of patient/patient's resources: Denies Self-Neglect: Denies Values / Beliefs Cultural Requests During Hospitalization: None Spiritual Requests During Hospitalization: None Consults Spiritual Care Consult Needed: No Social Work Consult Needed: No         Child/Adolescent Assessment Running Away Risk: (Patient is an adult)  Disposition:  Disposition Initial Assessment Completed for this Encounter: Yes Disposition of Patient: Admit Type of inpatient treatment program: Adult Patient refused recommended treatment: No Mode of transportation if patient is discharged/movement?: N/A Patient referred to: Other (Comment)(TBD)  On Site Evaluation by:   Reviewed with Physician:    Frederich Cha 08/27/2019 5:31 PM

## 2019-08-28 ENCOUNTER — Inpatient Hospital Stay: Admission: RE | Admit: 2019-08-28 | Payer: Medicaid Other | Source: Intra-hospital | Admitting: Psychiatry

## 2019-08-28 DIAGNOSIS — F22 Delusional disorders: Secondary | ICD-10-CM | POA: Diagnosis not present

## 2019-08-28 DIAGNOSIS — E079 Disorder of thyroid, unspecified: Secondary | ICD-10-CM | POA: Diagnosis not present

## 2019-08-28 DIAGNOSIS — F451 Undifferentiated somatoform disorder: Secondary | ICD-10-CM | POA: Diagnosis present

## 2019-08-28 DIAGNOSIS — M1990 Unspecified osteoarthritis, unspecified site: Secondary | ICD-10-CM | POA: Diagnosis present

## 2019-08-28 DIAGNOSIS — K5900 Constipation, unspecified: Secondary | ICD-10-CM | POA: Diagnosis present

## 2019-08-28 DIAGNOSIS — I10 Essential (primary) hypertension: Secondary | ICD-10-CM | POA: Diagnosis present

## 2019-08-28 DIAGNOSIS — F310 Bipolar disorder, current episode hypomanic: Secondary | ICD-10-CM

## 2019-08-28 DIAGNOSIS — R569 Unspecified convulsions: Secondary | ICD-10-CM | POA: Diagnosis not present

## 2019-08-28 DIAGNOSIS — F315 Bipolar disorder, current episode depressed, severe, with psychotic features: Secondary | ICD-10-CM | POA: Diagnosis present

## 2019-08-28 DIAGNOSIS — R0602 Shortness of breath: Secondary | ICD-10-CM | POA: Diagnosis not present

## 2019-08-28 DIAGNOSIS — Z79899 Other long term (current) drug therapy: Secondary | ICD-10-CM | POA: Diagnosis not present

## 2019-08-28 DIAGNOSIS — G40901 Epilepsy, unspecified, not intractable, with status epilepticus: Secondary | ICD-10-CM | POA: Diagnosis not present

## 2019-08-28 DIAGNOSIS — Z781 Physical restraint status: Secondary | ICD-10-CM | POA: Diagnosis not present

## 2019-08-28 DIAGNOSIS — N390 Urinary tract infection, site not specified: Secondary | ICD-10-CM | POA: Diagnosis present

## 2019-08-28 DIAGNOSIS — F200 Paranoid schizophrenia: Secondary | ICD-10-CM | POA: Diagnosis not present

## 2019-08-28 DIAGNOSIS — Z62810 Personal history of physical and sexual abuse in childhood: Secondary | ICD-10-CM | POA: Diagnosis present

## 2019-08-28 DIAGNOSIS — Z20828 Contact with and (suspected) exposure to other viral communicable diseases: Secondary | ICD-10-CM | POA: Diagnosis not present

## 2019-08-28 DIAGNOSIS — G20 Parkinson's disease: Secondary | ICD-10-CM | POA: Diagnosis not present

## 2019-08-28 DIAGNOSIS — B009 Herpesviral infection, unspecified: Secondary | ICD-10-CM | POA: Diagnosis present

## 2019-08-28 DIAGNOSIS — E039 Hypothyroidism, unspecified: Secondary | ICD-10-CM | POA: Diagnosis present

## 2019-08-28 DIAGNOSIS — Z87891 Personal history of nicotine dependence: Secondary | ICD-10-CM | POA: Diagnosis not present

## 2019-08-28 DIAGNOSIS — F2081 Schizophreniform disorder: Secondary | ICD-10-CM | POA: Diagnosis not present

## 2019-08-28 DIAGNOSIS — G40909 Epilepsy, unspecified, not intractable, without status epilepticus: Secondary | ICD-10-CM | POA: Diagnosis present

## 2019-08-28 DIAGNOSIS — F329 Major depressive disorder, single episode, unspecified: Secondary | ICD-10-CM | POA: Diagnosis not present

## 2019-08-28 DIAGNOSIS — R064 Hyperventilation: Secondary | ICD-10-CM | POA: Diagnosis not present

## 2019-08-28 DIAGNOSIS — K219 Gastro-esophageal reflux disease without esophagitis: Secondary | ICD-10-CM | POA: Diagnosis present

## 2019-08-28 DIAGNOSIS — F29 Unspecified psychosis not due to a substance or known physiological condition: Secondary | ICD-10-CM | POA: Diagnosis not present

## 2019-08-28 LAB — CBC WITH DIFFERENTIAL/PLATELET
Abs Immature Granulocytes: 0.06 10*3/uL (ref 0.00–0.07)
Basophils Absolute: 0.1 10*3/uL (ref 0.0–0.1)
Basophils Relative: 1 %
Eosinophils Absolute: 0.2 10*3/uL (ref 0.0–0.5)
Eosinophils Relative: 1 %
HCT: 46.8 % — ABNORMAL HIGH (ref 36.0–46.0)
Hemoglobin: 15.5 g/dL — ABNORMAL HIGH (ref 12.0–15.0)
Immature Granulocytes: 0 %
Lymphocytes Relative: 18 %
Lymphs Abs: 3 10*3/uL (ref 0.7–4.0)
MCH: 31.5 pg (ref 26.0–34.0)
MCHC: 33.1 g/dL (ref 30.0–36.0)
MCV: 95.1 fL (ref 80.0–100.0)
Monocytes Absolute: 1 10*3/uL (ref 0.1–1.0)
Monocytes Relative: 6 %
Neutro Abs: 12.3 10*3/uL — ABNORMAL HIGH (ref 1.7–7.7)
Neutrophils Relative %: 74 %
Platelets: 311 10*3/uL (ref 150–400)
RBC: 4.92 MIL/uL (ref 3.87–5.11)
RDW: 13.7 % (ref 11.5–15.5)
WBC: 16.6 10*3/uL — ABNORMAL HIGH (ref 4.0–10.5)
nRBC: 0 % (ref 0.0–0.2)

## 2019-08-28 LAB — SARS CORONAVIRUS 2 BY RT PCR (HOSPITAL ORDER, PERFORMED IN ~~LOC~~ HOSPITAL LAB): SARS Coronavirus 2: NEGATIVE

## 2019-08-28 MED ORDER — OLANZAPINE 5 MG PO TABS
15.0000 mg | ORAL_TABLET | Freq: Every day | ORAL | Status: DC
  Administered 2019-08-28: 15 mg via ORAL
  Filled 2019-08-28: qty 1

## 2019-08-28 MED ORDER — HYDROCERIN EX CREA
1.0000 "application " | TOPICAL_CREAM | Freq: Two times a day (BID) | CUTANEOUS | Status: DC
  Administered 2019-08-28: 1 via TOPICAL
  Filled 2019-08-28: qty 113

## 2019-08-28 MED ORDER — HYDROXYZINE HCL 25 MG PO TABS: 50.0000 mg | ORAL_TABLET | Freq: Four times a day (QID) | ORAL | Status: DC | PRN

## 2019-08-28 MED ORDER — NITROFURANTOIN MONOHYD MACRO 100 MG PO CAPS
100.0000 mg | ORAL_CAPSULE | Freq: Two times a day (BID) | ORAL | Status: DC
  Administered 2019-08-28: 10:00:00 100 mg via ORAL
  Filled 2019-08-28: qty 1

## 2019-08-28 MED ORDER — ALBUTEROL SULFATE HFA 108 (90 BASE) MCG/ACT IN AERS: 2.0000 | INHALATION_SPRAY | Freq: Four times a day (QID) | RESPIRATORY_TRACT | Status: DC | PRN

## 2019-08-28 MED ORDER — LAMOTRIGINE 25 MG PO TABS
50.0000 mg | ORAL_TABLET | Freq: Every day | ORAL | Status: DC
  Administered 2019-08-28: 50 mg via ORAL
  Filled 2019-08-28: qty 2

## 2019-08-28 MED ORDER — ALBUTEROL SULFATE (2.5 MG/3ML) 0.083% IN NEBU: 2.5000 mg | INHALATION_SOLUTION | Freq: Four times a day (QID) | RESPIRATORY_TRACT | Status: DC | PRN

## 2019-08-28 MED ORDER — FLUOCINONIDE 0.05 % EX CREA
1.0000 "application " | TOPICAL_CREAM | Freq: Two times a day (BID) | CUTANEOUS | Status: DC | PRN
  Filled 2019-08-28: qty 30

## 2019-08-28 MED ORDER — MAGNESIUM OXIDE 400 (241.3 MG) MG PO TABS
400.0000 mg | ORAL_TABLET | Freq: Every day | ORAL | Status: DC
  Administered 2019-08-28: 400 mg via ORAL
  Filled 2019-08-28: qty 1

## 2019-08-28 MED ORDER — PROPRANOLOL HCL 20 MG PO TABS
10.0000 mg | ORAL_TABLET | Freq: Two times a day (BID) | ORAL | Status: DC
  Administered 2019-08-28: 10:00:00 10 mg via ORAL
  Filled 2019-08-28: qty 1

## 2019-08-28 MED ORDER — LEVOTHYROXINE SODIUM 88 MCG PO TABS: 88.0000 ug | ORAL_TABLET | Freq: Every day | ORAL | Status: DC

## 2019-08-28 MED ORDER — MAGIC MOUTHWASH
5.0000 mL | Freq: Four times a day (QID) | ORAL | Status: DC | PRN
  Administered 2019-08-28: 5 mL via ORAL
  Filled 2019-08-28: qty 5

## 2019-08-28 NOTE — ED Notes (Signed)
Officers at bedside now.

## 2019-08-28 NOTE — ED Notes (Signed)
Pt refusing to eat at this time due to her mouth being "on fire from toxins".

## 2019-08-28 NOTE — ED Notes (Signed)
PT working with pt-pt took a couple of steps and reports dizziness and nausea. Darnelle Maffucci, NP at bedside to observe this.

## 2019-08-28 NOTE — ED Notes (Signed)
Legal Paperwork faxed to [facility]/ [employer] notified of transportation needed waiting on [facility]

## 2019-08-28 NOTE — ED Notes (Signed)
Pt walked x2 assist to restroom.

## 2019-08-28 NOTE — ED Notes (Signed)
Reviewed Chart Pt VOL/Consult completed/Pending Inpt Admit/ Per TTS assigned to BMU 303 when bed available.

## 2019-08-28 NOTE — BH Assessment (Signed)
Patient is to be admitted to [facility] by Caroline Sauger, NP.  Attending Physician will be Dr. Weber Cooks.   Patient has been assigned to room 303-A, by [place].   Intake Paper Work has been signed and placed on patient chart.   ER staff is aware of the admission:  Glenda, ER Secretary    Dr. Jimmye Norman, ER MD   Mickel Baas, Patient's Nurse   Elberta Fortis, Patient Access.

## 2019-08-28 NOTE — ED Notes (Signed)
Pt used restroom, asked for assistance to the restroom, this edt followed pt to restroom for support as needed. Once pt ambulated back to room by themself with no assistance from staff. Pt back in bed comfortable

## 2019-08-28 NOTE — ED Notes (Signed)
Pt refusing bath at this time. Shower chair offered but pt still says no for now.

## 2019-08-28 NOTE — ED Notes (Signed)
Assisted pt in cleaning dentures and applied denture adhesive.

## 2019-08-28 NOTE — BH Assessment (Signed)
Bed has been canceled due to change in disposition.   Mickel Baas, Patient's Nurse   Elberta Fortis, Patient Access.

## 2019-08-28 NOTE — BH Assessment (Addendum)
Patient has been accepted to [facility].  Patient assigned to [facility] Accepting physician is Dr. Terrilyn Saver.  Call report to [phone].  Representative was [employer].   ER Staff is aware of it:  Holley Raring, ER Secretary  Dr. Jimmye Norman, ER MD  Janett Billow, Patient's Nurse     Attempted to contact Patient's Family/Support System [name] Palmer: [phone]) - no answer and no voicemail box setup.   *Pt can be transported to accepting facility once IVC papers have been received - FAX: [phone].

## 2019-08-28 NOTE — ED Notes (Signed)
Pt asleep, breakfast tray placed in rm.  

## 2019-08-28 NOTE — ED Provider Notes (Signed)
-----------------------------------------   5:35 AM on 08/28/2019 -----------------------------------------   Blood pressure (!) 144/86, pulse (!) 103, temperature 99.3 F (37.4 C), temperature source Oral, resp. rate 20, SpO2 98 %.  The patient is calm and cooperative at this time.  There have been no acute events since the last update.  Awaiting disposition plan from Behavioral Medicine and/or Social Work team(s).   Hinda Kehr, MD 08/28/19 [phone]

## 2019-08-28 NOTE — ED Notes (Signed)
Special tray ordered from dietary for soft, bland foods to attempt to get pt to eat.

## 2019-08-28 NOTE — ED Notes (Signed)
Pt given lunch tray and was unable to eat anything. Pt c/o "mouth burning and feels like it is on fire." Leslie Wong is already aware.

## 2019-08-28 NOTE — ED Notes (Signed)
Pt offered a shower. When pt stood up pt stated "I feel weak and very nauseous. I am going to wait to take a shower." Pt agreed to let this tech know when she is ready for a shower.

## 2019-08-28 NOTE — ED Notes (Signed)
After giving patient medications, patient complaining that she feels dirty. This RN offered for patient to have shower and get a change of close and have bed changed. Patient agreeable, however when she went to stand up out of bed, patient was very unsteady and started to complain of dizziness and nausea. Patient states she wants to lay back down and will take a shower in a little while. MD and charge nurse made aware and order for PT consult placed. Patient resting in bed with eyes closed and no complaints at this time.

## 2019-08-28 NOTE — ED Notes (Signed)
Pt awake at this time stating "I need my medication that I have not had since I have been here. I have toxic waste inside my body. I also need some pain medicine." Pt made aware that I will inform Laura,RN.
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
## 2019-08-28 NOTE — ED Notes (Signed)
Pt understanding of being transferred to [facility]. Pt has been there before. Pt unable to sign due to IVC.

## 2019-08-28 NOTE — ED Notes (Signed)
This RN spoke to [employer] who stated that their director of staff would not take pt unless she was IVC. Charge RN, Wellsite geologist notified and Dr. Jimmye Norman notified.

## 2019-08-28 NOTE — Consult Note (Addendum)
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 place, and time.    Review of Systems  Constitutional: Positive for malaise/fatigue and weight loss.  HENT: Negative.   Eyes: Negative.   Respiratory: Negative.   Cardiovascular: Negative.   Gastrointestinal: Negative.   Genitourinary: Negative.   Musculoskeletal: Positive for joint pain and myalgias.  Skin: Negative.   Neurological: Negative.   Endo/Heme/Allergies: Negative.   Psychiatric/Behavioral: Negative for suicidal ideas. The patient is nervous/anxious.        Poor appetite    Blood pressure 119/79, pulse 80, temperature 98.6 F (37 C), temperature source Oral, resp. rate 20, SpO2 100 %.There is no height or weight on file to calculate BMI.  General Appearance:  Disheveled  Eye Contact:  Fair  Speech:  Clear and Coherent and rapid  Volume:  Normal  Mood:  Anxious  Affect:  Depressed and Tearful  Thought Process:  Linear  Orientation:  Full (Time, Place, and Person)  Thought Content:  Paranoid Ideation  Suicidal Thoughts:  No  Homicidal Thoughts:  No  Memory:  Immediate;   Good Recent;   Good Remote;   Good  Judgement:  Fair  Insight:  Fair  Psychomotor Activity:  Decreased  Concentration:  Concentration: Fair  Recall:  [place] of Knowledge:  Fair  Language:  Fair  Akathisia:  No  Handed:    AIMS (if indicated):     Assets:  Desire for Improvement Financial Resources/Insurance Housing Resilience Social Support Transportation  ADL's:  Intact  Cognition:  WNL  Sleep:        Treatment Plan Summary: Daily contact with patient to assess and evaluate symptoms and progress in treatment and Medication management  Disposition: Recommend psychiatric Inpatient admission when medically cleared.  Lewis Shock, FNP 08/28/2019 1:57 PM  Case discussed and plan agreed upon in consultation with Mr Money

## 2019-08-29 LAB — MISC LABCORP TEST (SEND OUT): Labcorp test code: 70813

## 2019-08-30 ENCOUNTER — Telehealth: Payer: Self-pay

## 2019-08-30 NOTE — Telephone Encounter (Signed)
I can't call her back w/o DPR.  Patient currently admitted, will await inpatient recs.  Thanks.

## 2019-08-30 NOTE — Telephone Encounter (Signed)
Marveen Reeks friend of pt for 24 years(No DPR signed) called and said that she had just spoken with pt and pt wanted her to call Dr Damita Dunnings so he would be aware of what is going on. Kieth Brightly said that pt just went to court to prove nothing was wrong with her and she was competent. Pt was taken to a psych ward in [facility]; Kieth Brightly does not know the name of the facility but pt wanted Dr Damita Dunnings to know where she was. Then Kieth Brightly said pt has mold in her appt and that is causing papercuts on hand and feet. Kieth Brightly said just wanted Dr Damita Dunnings to know what was going on and call ended.

## 2019-09-01 DIAGNOSIS — F200 Paranoid schizophrenia: Secondary | ICD-10-CM | POA: Diagnosis not present

## 2019-09-01 DIAGNOSIS — R569 Unspecified convulsions: Secondary | ICD-10-CM | POA: Diagnosis not present

## 2019-09-01 DIAGNOSIS — F2081 Schizophreniform disorder: Secondary | ICD-10-CM | POA: Diagnosis not present

## 2019-09-01 DIAGNOSIS — Z87891 Personal history of nicotine dependence: Secondary | ICD-10-CM | POA: Diagnosis not present

## 2019-09-01 DIAGNOSIS — E079 Disorder of thyroid, unspecified: Secondary | ICD-10-CM | POA: Diagnosis not present

## 2019-09-10 ENCOUNTER — Telehealth: Payer: Self-pay

## 2019-09-10 NOTE — Telephone Encounter (Signed)
Pt said on 08/21/19 Dr Demetra Shiner a dermatologist in [place] started pt on Doxepin 25 mg; Dr Weber Cooks psychiatrist [facility] prescribed Lamotrigine 25 mg and Olanzapine 15 mg. Pt said she cannot take either of these meds. Pt said her body cannot handle hypnotic drugs. Pt said all three of these meds cause her tongue to swell 3 x its size and get oral thrush. Pt stopped all three meds on 09/06/19. Pt does not have tongue swelling or thrush now since off meds. Pt has appt to see Dr Domenic Polite, pts regular psychiatrist from [facility] on 09/12/19. I advised pt she may need to ck with the psychiatrist about taking or not taking these 3 meds. Pt thought Dr Damita Dunnings could help her since he is her doctor; I advised I would send the note to Dr Damita Dunnings but if a specialist prescribes a med that provider usually advises the pt about the meds that he or she prescribes. Pt request cb. I have not put these meds on the allergy and adverse reaction list until reviewed by Dr Damita Dunnings.

## 2019-09-10 NOTE — Telephone Encounter (Signed)
Patient advised.

## 2019-09-10 NOTE — Telephone Encounter (Signed)
I think it makes sense for patient to discuss this with psychiatry, especially since she doesn't have tongue swelling now.  Thanks.

## 2019-09-11 ENCOUNTER — Emergency Department: Payer: Medicare Other

## 2019-09-11 ENCOUNTER — Other Ambulatory Visit: Payer: Self-pay

## 2019-09-11 ENCOUNTER — Emergency Department
Admission: EM | Admit: 2019-09-11 | Discharge: 2019-09-12 | Disposition: A | Discharge status: Other Acute Inpatient Hospital | Payer: Medicare Other | Attending: Emergency Medicine | Admitting: Emergency Medicine

## 2019-09-11 DIAGNOSIS — Z79899 Other long term (current) drug therapy: Secondary | ICD-10-CM | POA: Insufficient documentation

## 2019-09-11 DIAGNOSIS — F22 Delusional disorders: Secondary | ICD-10-CM | POA: Diagnosis not present

## 2019-09-11 DIAGNOSIS — R0689 Other abnormalities of breathing: Secondary | ICD-10-CM | POA: Diagnosis not present

## 2019-09-11 DIAGNOSIS — R0602 Shortness of breath: Secondary | ICD-10-CM | POA: Insufficient documentation

## 2019-09-11 DIAGNOSIS — F29 Unspecified psychosis not due to a substance or known physiological condition: Secondary | ICD-10-CM | POA: Diagnosis not present

## 2019-09-11 DIAGNOSIS — F3170 Bipolar disorder, currently in remission, most recent episode unspecified: Secondary | ICD-10-CM | POA: Diagnosis not present

## 2019-09-11 DIAGNOSIS — G20 Parkinson's disease: Secondary | ICD-10-CM | POA: Diagnosis not present

## 2019-09-11 DIAGNOSIS — E860 Dehydration: Secondary | ICD-10-CM | POA: Diagnosis not present

## 2019-09-11 DIAGNOSIS — E039 Hypothyroidism, unspecified: Secondary | ICD-10-CM | POA: Diagnosis not present

## 2019-09-11 DIAGNOSIS — F17210 Nicotine dependence, cigarettes, uncomplicated: Secondary | ICD-10-CM | POA: Diagnosis not present

## 2019-09-11 DIAGNOSIS — F319 Bipolar disorder, unspecified: Secondary | ICD-10-CM | POA: Diagnosis not present

## 2019-09-11 DIAGNOSIS — Z20828 Contact with and (suspected) exposure to other viral communicable diseases: Secondary | ICD-10-CM | POA: Insufficient documentation

## 2019-09-11 DIAGNOSIS — Z046 Encounter for general psychiatric examination, requested by authority: Secondary | ICD-10-CM | POA: Diagnosis present

## 2019-09-11 DIAGNOSIS — R52 Pain, unspecified: Secondary | ICD-10-CM | POA: Diagnosis not present

## 2019-09-11 LAB — URINALYSIS, COMPLETE (UACMP) WITH MICROSCOPIC
Bacteria, UA: NONE SEEN
Bilirubin Urine: NEGATIVE
Glucose, UA: NEGATIVE mg/dL
Hgb urine dipstick: NEGATIVE
Ketones, ur: NEGATIVE mg/dL
Leukocytes,Ua: NEGATIVE
Nitrite: NEGATIVE
Protein, ur: NEGATIVE mg/dL
Specific Gravity, Urine: 1.004 — ABNORMAL LOW (ref 1.005–1.030)
pH: 6 (ref 5.0–8.0)

## 2019-09-11 LAB — BASIC METABOLIC PANEL
Anion gap: 9 (ref 5–15)
BUN: 13 mg/dL (ref 6–20)
CO2: 28 mmol/L (ref 22–32)
Calcium: 10.9 mg/dL — ABNORMAL HIGH (ref 8.9–10.3)
Chloride: 100 mmol/L (ref 98–111)
Creatinine, Ser: 0.82 mg/dL (ref 0.44–1.00)
GFR calc Af Amer: >60 mL/min (ref >60)
GFR calc non Af Amer: >60 mL/min (ref >60)
Glucose, Bld: 118 mg/dL — ABNORMAL HIGH (ref 70–99)
Potassium: 4 mmol/L (ref 3.5–5.1)
Sodium: 137 mmol/L (ref 135–145)

## 2019-09-11 LAB — CBC
HCT: 45.1 % (ref 36.0–46.0)
Hemoglobin: 15.4 g/dL — ABNORMAL HIGH (ref 12.0–15.0)
MCH: 31.6 pg (ref 26.0–34.0)
MCHC: 34.1 g/dL (ref 30.0–36.0)
MCV: 92.6 fL (ref 80.0–100.0)
Platelets: 312 10*3/uL (ref 150–400)
RBC: 4.87 MIL/uL (ref 3.87–5.11)
RDW: 13.3 % (ref 11.5–15.5)
WBC: 16.1 10*3/uL — ABNORMAL HIGH (ref 4.0–10.5)
nRBC: 0 % (ref 0.0–0.2)

## 2019-09-11 LAB — ETHANOL: Alcohol, Ethyl (B): <10 mg/dL (ref <10)

## 2019-09-11 MED ORDER — SODIUM CHLORIDE 0.9 % IV BOLUS
1000.0000 mL | Freq: Once | INTRAVENOUS | Status: AC
  Administered 2019-09-11: 1000 mL via INTRAVENOUS

## 2019-09-11 NOTE — ED Triage Notes (Signed)
Reports generalized pain "all over" that started last night and SOB that started last night. VSS with EMS. 167 CBG. Pt hyperventilating at time of arrival. Pt in NAD. RR unlabored.

## 2019-09-11 NOTE — ED Notes (Signed)
Contacted daughter, as pt states that she is/was her legal guardian Clarita Leber at [employer]. No answer-unable to leave voicemail.

## 2019-09-11 NOTE — ED Notes (Signed)
Report given to [employer]. Pt alert and calm at this time. Dressed in behavioral scrubs and belongings placed in labeled bag and given to RN.

## 2019-09-11 NOTE — ED Provider Notes (Signed)
[facility] Emergency Department Provider Note  ____________________________________________   First MD Initiated Contact with Patient 09/11/19 2123     (approximate)  I have reviewed the triage vital signs and the nursing notes.   HISTORY  Chief Complaint Pain and Shortness of Breath    HPI Leslie Wong is a 63 y.o. female who presents with concerns of mold in her lungs.  Patient was seen on 9/1 for similar concerns of feeling that where she lives is full of toxic waste.  Patient was seen by psychiatric team and IVC due to concern for paranoia and went to psych inpatient. Patient was started on some medications.  She is not been taking his medications.  Patient  [name] that her friend took her back to her house in order to get her some of her items and she feels like she was re-contaminated again.  She endorsing chronic pain all over and concerned that she is severely dehydrated, constant, recent onset nothing makes it better or worse.  No fevers.           Past Medical History:  Diagnosis Date   Back pain    Cancer ([place])    skin   Depression    BAD   Gastroparesis    GERD (gastroesophageal reflux disease)    Hypothyroidism    Insomnia    Migraines    Parkinson disease ([place])    per [facility], dx'd 2019   Recurrent cold sores    Shoulder bursitis    Urge incontinence     Patient Active Problem List   Diagnosis Date Noted   Cannabis abuse 05/28/2019   Bipolar 1 disorder, mixed, severe ([place]) 05/27/2019   Opiate dependence ([place]) 04/25/2019   Cracking skin 01/27/2019   Delusion ([place]) 09/26/2018   Night sweats 05/13/2018   Pupil asymmetry 05/13/2018   DDD (degenerative disc disease), cervical 04/23/2018   Parkinson disease ([place])    HLD (hyperlipidemia) 03/10/2016   Advance care planning 03/10/2016   Cervical radiculopathy 07/30/2015   Osteopenia 04/18/2014   Medicare annual wellness visit, subsequent  03/18/2014   Shoulder pain 01/17/2013   Chronic back pain 09/14/2011   Hypercalcemia 03/25/2010   INCONTINENCE, URGE 04/16/2009   CARPAL TUNNEL SYNDROME, BILATERAL 09/24/2008   URETHRAL STRICTURE 06/13/2007   Hypothyroidism 06/05/2007   Bipolar affective disorder, current episode hypomanic ([place]) 06/05/2007   MIGRAINE HEADACHE 06/05/2007   Gastroesophageal reflux disease with hiatal hernia 06/05/2007   IRRITABLE BOWEL SYNDROME 06/05/2007   FIBROCYSTIC BREAST DISEASE 06/05/2007    Past Surgical History:  Procedure Laterality Date   ABDOMINAL HYSTERECTOMY  1999   total   APPENDECTOMY     ESOPHAGOGASTRODUODENOSCOPY  01/2006   negative except small hiatal hernia   ESOPHAGOGASTRODUODENOSCOPY  02/24/2015   See report   LAPAROSCOPY     for endometriosis   OVARIAN CYST REMOVAL  1990   unilateral   TONSILLECTOMY AND ADENOIDECTOMY      Prior to Admission medications   Medication Sig Start Date End Date Taking? Authorizing Provider  albuterol (VENTOLIN HFA) 108 (90 Base) MCG/ACT inhaler Inhale 2 puffs into the lungs every 6 (six) hours as needed for wheezing or shortness of breath. 07/25/19   Tonia Ghent, MD  ascorbic acid (VITAMIN C) 500 MG tablet Take 1 tablet (500 mg total) by mouth daily. Patient not taking: Reported on 08/27/2019 07/25/19   Tonia Ghent, MD  cholecalciferol (VITAMIN D) 25 MCG (1000 UT) tablet Take 1 tablet (1,000  Units total) by mouth daily. Patient not taking: Reported on 08/27/2019 07/25/19   Tonia Ghent, MD  Diphenhyd-Hydrocort-Nystatin (FIRST-DUKES MOUTHWASH) SUSP Use as directed 5 mLs in the mouth or throat 4 (four) times daily as needed. 08/23/19   Tonia Ghent, MD  fluocinonide cream (LIDEX) [id number] % Apply 1 application topically 2 (two) times daily as needed. 08/16/19   Tonia Ghent, MD  hydrocerin (EUCERIN) CREA Apply 1 application topically 2 (two) times daily. 04/29/19   Patrecia Pour, NP  hydrOXYzine (ATARAX/VISTARIL) 50 MG  tablet Take 1 tablet (50 mg total) by mouth every 6 (six) hours as needed. 07/11/19   Tonia Ghent, MD  lamoTRIgine (LAMICTAL) 25 MG tablet Take 2 tablets (50 mg total) by mouth daily. 05/30/19   [name], [name], MD  levothyroxine (SYNTHROID) 88 MCG tablet Take 1 tablet (88 mcg total) by mouth daily before breakfast. 07/11/19   Tonia Ghent, MD  magnesium oxide (MAG-OX) 400 MG tablet Take 400 mg by mouth daily.    [provider]  Multiple Vitamin (MULTIVITAMIN) tablet Take 1 tablet by mouth daily. Patient not taking: Reported on 08/27/2019 07/25/19   Tonia Ghent, MD  nitrofurantoin, macrocrystal-monohydrate, (MACROBID) 100 MG capsule Take 100 mg by mouth 2 (two) times daily.    [provider]  OLANZapine (ZYPREXA) 15 MG tablet Take 15 mg by mouth daily. 05/31/19   [provider]  Omega-3 Fatty Acids (FISH OIL) 1000 MG CAPS Take 1 capsule (1,000 mg total) by mouth daily. Patient not taking: Reported on 08/27/2019 07/25/19   Tonia Ghent, MD  omeprazole (PRILOSEC) 20 MG capsule Take 1 capsule (20 mg total) by mouth daily. 07/25/19   Tonia Ghent, MD  ondansetron (ZOFRAN-ODT) 4 MG disintegrating tablet Take 1 tablet (4 mg total) by mouth 3 (three) times daily as needed for nausea or vomiting. 07/26/19   Tonia Ghent, MD  propranolol (INDERAL) 10 MG tablet Take 1 tablet (10 mg total) by mouth 2 (two) times daily. 07/25/19   Tonia Ghent, MD  senna-docusate (SENOKOT-S) 8.6-50 MG tablet Take 2 tablets by mouth daily. 07/25/19   Tonia Ghent, MD  SENOKOT 8.6 MG tablet Take 2 tablets by mouth daily. 07/14/19   [provider]  tiZANidine (ZANAFLEX) 4 MG tablet Take 1 tablet (4 mg total) by mouth every 8 (eight) hours as needed for muscle spasms. 07/11/19   Tonia Ghent, MD  valACYclovir (VALTREX) 500 MG tablet TAKE 1 TABLET(500 MG) BY MOUTH TWICE DAILY 07/23/19   Tonia Ghent, MD  vitamin B-12 (CYANOCOBALAMIN) 1000 MCG tablet Take 1 tablet (1,000  mcg total) by mouth daily. Patient not taking: Reported on 08/27/2019 07/25/19   Tonia Ghent, MD    Allergies Valproic acid, Aspirin, Atorvastatin, Baclofen, Codeine, Duloxetine, Erythromycin, Gabapentin, Metoclopramide hcl, Nortriptyline, Nsaids, Nucynta er [tapentadol hcl er], Nucynta [tapentadol], Prednisone, Pregabalin, Seroquel [quetiapine fumerate], Sulfa antibiotics, Topamax, and Vicodin [hydrocodone-acetaminophen]  Family History  Problem Relation Age of Onset   Hypertension Mother    Arthritis Mother    Diabetes Mother    Stroke Mother    Cancer Father        lung   Colon cancer Neg Hx    Breast cancer Neg Hx     Social History Social History   Tobacco Use   Smoking status: Current Some Day Smoker    Packs/day: 1.00    Years: 42.50    Pack years: 42.50  Types: Cigarettes   Smokeless tobacco: Former Network engineer Use Topics   Alcohol use: No    Alcohol/week: 0.0 standard drinks   Drug use: No      Review of Systems Constitutional: No fever/chills + chronic pain Eyes: No visual changes. ENT: No sore throat. + dry mouth Cardiovascular: Denies chest pain. Respiratory: Denies shortness of breath. + cough Gastrointestinal: No abdominal pain.  No nausea, no vomiting.  No diarrhea.  No constipation. Genitourinary: Negative for dysuria. Musculoskeletal: Negative for back pain. Skin: Negative for rash. Neurological: Negative for headaches, focal weakness or numbness. All other ROS negative ____________________________________________   PHYSICAL EXAM:  VITAL SIGNS: ED Triage Vitals  Enc Vitals Group     BP 09/11/19 1840 (!) 120/106     Pulse Rate 09/11/19 1840 88     Resp 09/11/19 1840 (!) 24     Temp 09/11/19 1840 97.9 F (36.6 C)     Temp Source 09/11/19 1840 Oral     SpO2 09/11/19 1840 96 %     Weight 09/11/19 1841 100 lb (45.4 kg)     Height 09/11/19 1841 5' (1.524 m)     Head Circumference --      Peak Flow --      Pain Score  09/11/19 1841 10     Pain Loc --      Pain Edu? --      Excl. in [place]? --     Constitutional: Alert and oriented. Well appearing and in no acute distress. Eyes: Conjunctivae are normal. EOMI. Head: Atraumatic. Nose: No congestion/rhinnorhea. Mouth/Throat: Mucous membranes are moist.   Neck: No stridor. Trachea Midline. FROM Cardiovascular: Normal rate, regular rhythm. Grossly normal heart sounds.  Good peripheral circulation. Respiratory: Normal respiratory effort.  No retractions. Lungs CTAB. Gastrointestinal: Soft and nontender. No distention. No abdominal bruits.  Musculoskeletal: No lower extremity tenderness nor edema.  No joint effusions. Neurologic:  Normal speech and language. No gross focal neurologic deficits are appreciated.  Skin:  Skin is warm, dry and intact. No rash noted. Psychiatric: pt fidgety, denies SI HI but paranoia in regards to concern for mold infection.  GU: Deferred   ____________________________________________   LABS (all labs ordered are listed, but only abnormal results are displayed)  Labs Reviewed  BASIC METABOLIC PANEL - Abnormal; Notable for the following components:      Result Value   Glucose, Bld 118 (*)    Calcium 10.9 (*)    All other components within normal limits  CBC - Abnormal; Notable for the following components:   WBC 16.1 (*)    Hemoglobin 15.4 (*)    All other components within normal limits  URINALYSIS, COMPLETE (UACMP) WITH MICROSCOPIC - Abnormal; Notable for the following components:   Color, Urine STRAW (*)    APPearance CLEAR (*)    Specific Gravity, Urine 1.004 (*)    All other components within normal limits  SARS CORONAVIRUS 2 (HOSPITAL ORDER, [name] [facility])  ETHANOL  URINE DRUG SCREEN, QUALITATIVE ([facility] ONLY)  TROPONIN I (HIGH SENSITIVITY)   ____________________________________________   ED ECG REPORT I, Vanessa Glendo, the attending physician, personally viewed and interpreted this  ECG.  EKG is normal sinus rate of 83, no ST elevation, no T wave inversion, normal intervals ____________________________________________  RADIOLOGY Robert Bellow, personally viewed and evaluated these images (plain radiographs) as part of my medical decision making, as well as reviewing the written report by the radiologist.  ED  MD interpretation:  No pna  Official radiology report(s): Dg Chest 2 View  Result Date: 09/11/2019 CLINICAL DATA:  Shortness of breath, generalized pain all over EXAM: CHEST - 2 VIEW COMPARISON:  CT 02/14/2019, radiograph 09/20/2018 FINDINGS: Coarse reticular and bronchitic changes most pronounced in the lung bases with slight flattening of the diaphragms and apical lucency. No consolidation, features of edema, pneumothorax, or effusion. Pulmonary vascularity is normally distributed. The cardiomediastinal contours are unremarkable. No acute osseous or soft tissue abnormality. IMPRESSION: Chronic interstitial and bronchitic changes, possibly related to COPD or smoking related change. Correlate with history. No acute cardiopulmonary abnormality. Electronically Signed   By: Lovena Le M.D.   On: 09/11/2019 19:44    ____________________________________________   PROCEDURES  Procedure(s) performed (including Critical Care):  Procedures   ____________________________________________   INITIAL IMPRESSION / ASSESSMENT AND PLAN / ED COURSE  Leslie Wong was evaluated in Emergency Department on 09/11/2019 for the symptoms described in the history of present illness. She was evaluated in the context of the global COVID-19 pandemic, which necessitated consideration that the patient might be at risk for infection with the SARS-CoV-2 virus that causes COVID-19. Institutional protocols and algorithms that pertain to the evaluation of patients at risk for COVID-19 are in a state of rapid change based on information released by regulatory bodies including the CDC  and federal and state organizations. These policies and algorithms were followed during the patient's care in the ED.    Will get labs to evaluate for electrolyte abnormalities, PNA.  Lungs sound clear so low suspicion for COPD. Concern that given pt was just d/c from psych hospital with similar symptoms and now not taking meds I am concerned this is worsening psychiatric illness and will place IVC so they can evaluate pt.  Prior CT shows COPD changes in lungs.   Labs are notable for a normal urine without evidence of UTI.  Kidney function is normal.  White count is elevated at 16 but chronically elevated.  Chest x-ray without evidence of pneumonia but does have some evidence of COPD changes. Pt does have h/o smoking. No wheezing now to suggest COPD.    No exam findings to suggest medical cause of current presentation. Will order psychiatric screening labs and discuss further w/ psychiatric service.  D/d includes but is not limited to psychiatric disease, behavioral/personality disorder, inadequate socioeconomic support, medical.  Based on HPI, exam, unremarkable labs, no concern for acute medical problem at this time. No rigidity, clonus, hyperthermia, focal neurologic deficit, diaphoresis, tachycardia, meningismus, ataxia, gait abnormality or other finding to suggest this visit represents a non-psychiatric problem. Screening labs reviewed.    Given this, pt medically cleared, to be dispositioned per Psych.    ____________________________________________   FINAL CLINICAL IMPRESSION(S) / ED DIAGNOSES   Final diagnoses:  Delusional disorder ([place])      MEDICATIONS GIVEN DURING THIS VISIT:  Medications  sodium chloride 0.9 % bolus 1,000 mL (0 mLs Intravenous Stopped 09/11/19 2350)     ED Discharge Orders    None       Note:  This document was prepared using Dragon voice recognition software and may include unintentional dictation errors.   Vanessa [place], MD 09/12/19 [phone]

## 2019-09-12 DIAGNOSIS — F319 Bipolar disorder, unspecified: Secondary | ICD-10-CM | POA: Diagnosis not present

## 2019-09-12 DIAGNOSIS — F22 Delusional disorders: Secondary | ICD-10-CM

## 2019-09-12 DIAGNOSIS — R0602 Shortness of breath: Secondary | ICD-10-CM | POA: Diagnosis not present

## 2019-09-12 DIAGNOSIS — K219 Gastro-esophageal reflux disease without esophagitis: Secondary | ICD-10-CM | POA: Diagnosis present

## 2019-09-12 DIAGNOSIS — F459 Somatoform disorder, unspecified: Secondary | ICD-10-CM | POA: Diagnosis present

## 2019-09-12 DIAGNOSIS — Z20828 Contact with and (suspected) exposure to other viral communicable diseases: Secondary | ICD-10-CM | POA: Diagnosis not present

## 2019-09-12 DIAGNOSIS — B009 Herpesviral infection, unspecified: Secondary | ICD-10-CM | POA: Diagnosis present

## 2019-09-12 DIAGNOSIS — F17210 Nicotine dependence, cigarettes, uncomplicated: Secondary | ICD-10-CM | POA: Diagnosis not present

## 2019-09-12 DIAGNOSIS — M1990 Unspecified osteoarthritis, unspecified site: Secondary | ICD-10-CM | POA: Diagnosis present

## 2019-09-12 DIAGNOSIS — F3170 Bipolar disorder, currently in remission, most recent episode unspecified: Secondary | ICD-10-CM | POA: Diagnosis not present

## 2019-09-12 DIAGNOSIS — F3164 Bipolar disorder, current episode mixed, severe, with psychotic features: Secondary | ICD-10-CM | POA: Diagnosis present

## 2019-09-12 DIAGNOSIS — F209 Schizophrenia, unspecified: Secondary | ICD-10-CM | POA: Diagnosis not present

## 2019-09-12 DIAGNOSIS — E039 Hypothyroidism, unspecified: Secondary | ICD-10-CM | POA: Diagnosis present

## 2019-09-12 LAB — URINE DRUG SCREEN, QUALITATIVE (ARMC ONLY)
Amphetamines, Ur Screen: NOT DETECTED
Barbiturates, Ur Screen: NOT DETECTED
Benzodiazepine, Ur Scrn: NOT DETECTED
Cannabinoid 50 Ng, Ur ~~LOC~~: POSITIVE — AB
Cocaine Metabolite,Ur ~~LOC~~: NOT DETECTED
MDMA (Ecstasy)Ur Screen: NOT DETECTED
Methadone Scn, Ur: NOT DETECTED
Opiate, Ur Screen: NOT DETECTED
Phencyclidine (PCP) Ur S: NOT DETECTED
Tricyclic, Ur Screen: NOT DETECTED

## 2019-09-12 LAB — TROPONIN I (HIGH SENSITIVITY): Troponin I (High Sensitivity): 3 ng/L (ref <18)

## 2019-09-12 LAB — SARS CORONAVIRUS 2 BY RT PCR (HOSPITAL ORDER, PERFORMED IN ~~LOC~~ HOSPITAL LAB): SARS Coronavirus 2: NEGATIVE

## 2019-09-12 MED ORDER — OLANZAPINE 5 MG PO TBDP
15.0000 mg | ORAL_TABLET | Freq: Every day | ORAL | Status: DC
  Filled 2019-09-12: qty 1

## 2019-09-12 MED ORDER — ZIPRASIDONE MESYLATE 20 MG IM SOLR: 10.0000 mg | Freq: Four times a day (QID) | INTRAMUSCULAR | Status: DC | PRN

## 2019-09-12 MED ORDER — PANTOPRAZOLE SODIUM 40 MG PO TBEC: 40.0000 mg | DELAYED_RELEASE_TABLET | Freq: Every day | ORAL | Status: DC

## 2019-09-12 MED ORDER — MAGIC MOUTHWASH
10.0000 mL | Freq: Four times a day (QID) | ORAL | Status: DC | PRN
  Filled 2019-09-12 (×2): qty 10

## 2019-09-12 MED ORDER — MAGIC MOUTHWASH W/LIDOCAINE: 15.0000 mL | Freq: Four times a day (QID) | ORAL | Status: DC | PRN

## 2019-09-12 MED ORDER — LIDOCAINE VISCOUS HCL 2 % MT SOLN: 5.0000 mL | Freq: Four times a day (QID) | OROMUCOSAL | Status: DC | PRN

## 2019-09-12 MED ORDER — LEVOTHYROXINE SODIUM 88 MCG PO TABS: 88.0000 ug | ORAL_TABLET | Freq: Every day | ORAL | Status: DC

## 2019-09-12 MED ORDER — ARIPIPRAZOLE 5 MG PO TABS: 5.0000 mg | ORAL_TABLET | Freq: Every day | ORAL | Status: DC

## 2019-09-12 NOTE — ED Notes (Signed)
BEHAVIORAL HEALTH ROUNDING Patient sleeping: Yes.   Patient alert and oriented: eyes closed  Appears asleep Behavior appropriate: Yes.  ; If no, describe:  Nutrition and fluids offered: Yes  Toileting and hygiene offered: sleeping Sitter present: q 15 minute observations 

## 2019-09-12 NOTE — ED Notes (Signed)
Pt transferred to [facility]  - report given to [facility]

## 2019-09-12 NOTE — ED Notes (Signed)
Pt. Transferred to [place] from ED to room after screening for contraband. Report to include Situation, Background, Assessment and Recommendations from [place]. Pt. Oriented to unit including Q15 minute rounds as well as the security cameras for their protection. Patient is alert and oriented, warm and dry in no acute distress. Patient denies SI, HI, and AVH. Pt. Encouraged to let me know if needs arise.

## 2019-09-12 NOTE — ED Notes (Signed)
IVC/Consult completed/ Reassess this Am/ Inpt vs D/C

## 2019-09-12 NOTE — ED Notes (Signed)
Pt. Alert and oriented, warm and dry, in no distress. Pt. Denies SI, HI, and AVH. Pt moved from room 15 and placed in room 20. Pt. Encouraged to let nursing staff know of any concerns or needs.

## 2019-09-12 NOTE — ED Notes (Signed)
Hourly rounding reveals patient in room. No complaints, stable, in no acute distress. Q15 minute rounds and monitoring via Security Cameras to continue. 

## 2019-09-12 NOTE — ED Notes (Signed)
ED BHU [place] Is the patient under IVC or is there intent for IVC: Yes.   Is the patient medically cleared: Yes.   Is there vacancy in the ED BHU: Yes.   Is the population mix appropriate for patient: Yes.   Is the patient awaiting placement in inpatient or outpatient setting: Yes. Awaiting placement on inpt   Has the patient had a psychiatric consult: Yes.   Survey of unit performed for contraband, proper placement and condition of furniture, tampering with fixtures in bathroom, shower, and each patient room: Yes.  ; Findings:  APPEARANCE/BEHAVIOR Calm and cooperative NEURO ASSESSMENT Orientation: oriented x3  Denies pain Hallucinations: No.None noted (Hallucinations) denies  Paranoia present  Speech: Normal Gait: normal RESPIRATORY ASSESSMENT Even  Unlabored respirations  CARDIOVASCULAR ASSESSMENT Pulses equal   regular rate  Skin warm and dry   GASTROINTESTINAL ASSESSMENT no GI complaint EXTREMITIES Full ROM  PLAN OF CARE Provide calm/safe environment. Vital signs assessed twice daily. ED BHU Assessment once each 12-hour shift.  Assure the ED provider has rounded once each shift. Provide and encourage hygiene. Provide redirection as needed. Assess for escalating behavior; address immediately and inform ED provider.  Assess family dynamic and appropriateness for visitation as needed: Yes.  ; If necessary, describe findings:  Educate the patient/family about BHU procedures/visitation: Yes.  ; If necessary, describe findings:

## 2019-09-12 NOTE — ED Provider Notes (Signed)
-----------------------------------------   8:31 AM on 09/12/2019 -----------------------------------------   Blood pressure 119/72, pulse 85, temperature 97.9 F (36.6 C), temperature source Oral, resp. rate 14, height 5' (1.524 m), weight 45.4 kg, SpO2 98 %.  The patient is calm and cooperative at this time.  There have been no acute events since the last update.  Awaiting disposition plan from Behavioral Medicine and/or Social Work team(s).    Nena Polio, MD 09/12/19 [phone]

## 2019-09-12 NOTE — Consult Note (Signed)
[facility] Consult   Reason for Consult:   Paranoid thoughts Referring Physician:  Dr. Jari Pigg Patient Identification: Leslie Wong MRN:  [id number] Principal Diagnosis: <principal problem not specified> Diagnosis:  Active Problems:   * No active hospital problems. *   Total Time spent with patient: 45 minutes   Subjective:  "There is an EPA investigation at the apartment and that they would not release the results to [name]." Leslie Wong is a 63 y.o. female patient presented to [facility] ED via EMS under involuntary commitment status (IVC).  Per the ED triage nursing note, the patient reports that she keeps coming to the hospital because she is getting very sick from being inside her apartment, and no one is listening to her. She states that her apartment is built on top of a landfill. There is a lot of toxic waste creeping into her body; she reports black mold and radioactive material getting into her body, making her too sick.  She says she is in severe pain and having difficulty walking due to her pain. She voiced an EPA investigation at the apartment and that they would not release the results to them. She states "I know that there is something wrong with me because of the toxic waste in my apartment building."  The patient continues to report having toxins in her body and is causing her to have difficulty with eating as well as ambulating. The patient was seen face-to-face by this provider; chart reviewed and consulted with Dr. Joan Mayans on 09/11/2019 due to the patient's care. It was discussed with the EDP that the patient would be observed overnight and reassess in the a.m.  The patient is alert and oriented x3, anxious, but cooperative and mood-congruent with affect on evaluation. The patient does not appear to be responding to internal or external stimuli. The patient presents with some delusional thinking due to her believing that she has toxins in her body, which  are killing her.  The patient has been seen frequently in the ED for the past couple of weeks.  On a previous visit, she discussed that she does live in an apartment on a landfill. The patient denies auditory or visual hallucinations. The patient denies any suicidal, homicidal, or self-harm ideations. The patient is presenting with some psychotic and paranoid behaviors. The patient continues to show with these behaviors, which seems to be her baseline. During an encounter with the patient, she was able to answer questions appropriately. Collateral was not obtained; this provider attempted to call Leslie Wong (the patient's daughter) [phone] with no answer.  Therefore, this provider was unable to leave a [employer] message. Plan:  The patient will need to be reassessed in the a.m. to determine if she meets the criteria for psychiatric inpatient admission or requires discharge home.   HPI:  Per Dr. Jari Pigg: Leslie Wong is a 63 y.o. female who presents with concerns of mold in her lungs.  Patient was seen on 9/1 for similar concerns of feeling that where she lives is full of toxic waste.  Patient was seen by psychiatric team and IVC due to concern for paranoia and went to psych inpatient. Patient was started on some medications.  She is not been taking his medications.  Patient  [name] that her friend took her back to her house in order to get her some of her items and she feels like she was re-contaminated again.  She endorsing chronic pain all over and concerned that she  is severely dehydrated, constant, recent onset nothing makes it better or worse.  No fevers.   Past Psychiatric History: Multiple hospitalizations, Bipolar I  Risk to Self:  No Risk to Others:  No Prior Inpatient Therapy:  Yes Prior Outpatient Therapy:  Yes  Past Medical History:  Past Medical History:  Diagnosis Date  . Back pain   . Cancer ([place])    skin  . Depression    BAD  . Gastroparesis   . GERD  (gastroesophageal reflux disease)   . Hypothyroidism   . Insomnia   . Migraines   . Parkinson disease ([place])    per [place] [facility], dx'd 2019  . Recurrent cold sores   . Shoulder bursitis   . Urge incontinence     Past Surgical History:  Procedure Laterality Date  . ABDOMINAL HYSTERECTOMY  1999   total  . APPENDECTOMY    . ESOPHAGOGASTRODUODENOSCOPY  01/2006   negative except small hiatal hernia  . ESOPHAGOGASTRODUODENOSCOPY  02/24/2015   See report  . LAPAROSCOPY     for endometriosis  . OVARIAN CYST REMOVAL  1990   unilateral  . TONSILLECTOMY AND ADENOIDECTOMY     Family History:  Family History  Problem Relation Age of Onset  . Hypertension Mother   . Arthritis Mother   . Diabetes Mother   . Stroke Mother   . Cancer Father        lung  . Colon cancer Neg Hx   . Breast cancer Neg Hx    Family Psychiatric  History: None reported Social History:  Social History   Substance and Sexual Activity  Alcohol Use No  . Alcohol/week: 0.0 standard drinks     Social History   Substance and Sexual Activity  Drug Use No    Social History   Socioeconomic History  . Marital status: Divorced    Spouse name: Not on file  . Number of children: Not on file  . Years of education: Not on file  . Highest education level: Not on file  Occupational History  . Not on file  Social Needs  . Financial resource strain: Not on file  . Food insecurity    Worry: Not on file    Inability: Not on file  . Transportation needs    Medical: Not on file    Non-medical: Not on file  Tobacco Use  . Smoking status: Current Some Day Smoker    Packs/day: 1.00    Years: 42.50    Pack years: 42.50    Types: Cigarettes  . Smokeless tobacco: Former Network engineer and Sexual Activity  . Alcohol use: No    Alcohol/week: 0.0 standard drinks  . Drug use: No  . Sexual activity: Never  Lifestyle  . Physical activity    Days per week: Not on file    Minutes per session: Not on file  .  Stress: Not on file  Relationships  . Social Herbalist on phone: Not on file    Gets together: Not on file    Attends religious service: Not on file    Active member of club or organization: Not on file    Attends meetings of clubs or organizations: Not on file    Relationship status: Not on file  Other Topics Concern  . Not on file  Social History Narrative   Lives alone.     Has a Counsellor   Additional Social History:    Allergies:  Allergies  Allergen Reactions  . Valproic Acid Shortness Of Breath  . Aspirin     REACTION: UNSPECIFIED  . Atorvastatin Other (See Comments)    Tongue swelling, legs cramping  . Baclofen     REACTION: Throat swells up  . Codeine     REACTION: UNSPECIFIED  . Duloxetine Other (See Comments)    Vision loss, weight loss  . Erythromycin     REACTION: UNSPECIFIED  . Gabapentin     REACTION: throat swells up  . Metoclopramide Hcl     REACTION: states "messed me up"  . Nortriptyline     Other reaction(s): Other (See Comments) Vision issues  . Nsaids Other (See Comments)    Stomach problems  . Nucynta Er [Tapentadol Hcl Er]     Intolerant, see note from 07/24/12.    Gean [place]ch[place] [Tapentadol]     AMS  . Prednisone     GI intolance  . Pregabalin   . Seroquel [Quetiapine Fumerate] Other (See Comments)    Loss control, felt like on another planet  . Sulfa Antibiotics Nausea And Vomiting  . Topamax Other (See Comments)    Blisters in mouth and tongue  . Vicodin [Hydrocodone-Acetaminophen]     Nausea, thrush and constipation    Labs:  Results for orders placed or performed during the hospital encounter of 09/11/19 (from the past 48 hour(s))  Basic metabolic panel     Status: Abnormal   Collection Time: 09/11/19  6:46 PM  Result Value Ref Range   Sodium 137 135 - 145 mmol/L   Potassium 4.0 3.5 - 5.1 mmol/L   Chloride 100 98 - 111 mmol/L   CO2 28 22 - 32 mmol/L   Glucose, Bld 118 (H) 70 - 99 mg/dL   BUN 13 6 - 20 mg/dL    Creatinine, Ser 0.82 0.44 - 1.00 mg/dL   Calcium 10.9 (H) 8.9 - 10.3 mg/dL   GFR calc non Af Amer >60 >60 mL/min   GFR calc Af Amer >60 >60 mL/min   Anion gap 9 5 - 15    Comment: Performed at [facility], [address] [place], [place] [address]  CBC     Status: Abnormal   Collection Time: 09/11/19  6:46 PM  Result Value Ref Range   WBC 16.1 (H) 4.0 - 10.5 K/uL   RBC 4.87 3.87 - 5.11 MIL/uL   Hemoglobin 15.4 (H) 12.0 - 15.0 g/dL   HCT 45.1 36.0 - 46.0 %   MCV 92.6 80.0 - 100.0 fL   MCH 31.6 26.0 - 34.0 pg   MCHC 34.1 30.0 - 36.0 g/dL   RDW 13.3 11.5 - 15.5 %   Platelets 312 150 - 400 K/uL   nRBC 0.0 0.0 - 0.2 %    Comment: Performed at [facility], [place], [place] [address]  Troponin I (High Sensitivity)     Status: None   Collection Time: 09/11/19  6:46 PM  Result Value Ref Range   Troponin I (High Sensitivity) 3 <18 ng/L    Comment: (NOTE) Elevated high sensitivity troponin I (hsTnI) values and significant  changes across serial measurements may suggest ACS but many other  chronic and acute conditions are known to elevate hsTnI results.  Refer to the "Links" section for chest pain algorithms and additional  guidance. Performed at [facility], [address] [place] [address], [place], [place] [address]   Urinalysis, Complete w Microscopic     Status: Abnormal  Collection Time: 09/11/19  7:58 PM  Result Value Ref Range   Color, Urine STRAW (A) YELLOW   APPearance CLEAR (A) CLEAR   Specific Gravity, Urine 1.004 (L) 1.005 - 1.030   pH 6.0 5.0 - 8.0   Glucose, UA NEGATIVE NEGATIVE mg/dL   Hgb urine dipstick NEGATIVE NEGATIVE   Bilirubin Urine NEGATIVE NEGATIVE   Ketones, ur NEGATIVE NEGATIVE mg/dL   Protein, ur NEGATIVE NEGATIVE mg/dL   Nitrite NEGATIVE NEGATIVE   Leukocytes,Ua NEGATIVE NEGATIVE   RBC / HPF 0-5 0 - 5 RBC/hpf   WBC, UA 0-5 0 - 5 WBC/hpf   Bacteria, UA NONE SEEN NONE SEEN   Squamous Epithelial / LPF 0-5 0 - 5     Comment: Performed at [facility], [address] [place], [place] [address]  Urine Drug Screen, Qualitative ([facility] only)     Status: Abnormal   Collection Time: 09/11/19  7:58 PM  Result Value Ref Range   Tricyclic, Ur Screen NONE DETECTED NONE DETECTED   Amphetamines, Ur Screen NONE DETECTED NONE DETECTED   MDMA (Ecstasy)Ur Screen NONE DETECTED NONE DETECTED   Cocaine Metabolite,Ur [place] NONE DETECTED NONE DETECTED   Opiate, Ur Screen NONE DETECTED NONE DETECTED   Phencyclidine (PCP) Ur S NONE DETECTED NONE DETECTED   Cannabinoid 50 Ng, Ur [place] POSITIVE (A) NONE DETECTED   Barbiturates, Ur Screen NONE DETECTED NONE DETECTED   Benzodiazepine, Ur Scrn NONE DETECTED NONE DETECTED   Methadone Scn, Ur NONE DETECTED NONE DETECTED    Comment: (NOTE) Tricyclics + metabolites, urine    Cutoff 1000 ng/mL Amphetamines + metabolites, urine  Cutoff 1000 ng/mL MDMA (Ecstasy), urine              Cutoff 500 ng/mL Cocaine Metabolite, urine          Cutoff 300 ng/mL Opiate + metabolites, urine        Cutoff 300 ng/mL Phencyclidine (PCP), urine         Cutoff 25 ng/mL Cannabinoid, urine                 Cutoff 50 ng/mL Barbiturates + metabolites, urine  Cutoff 200 ng/mL Benzodiazepine, urine              Cutoff 200 ng/mL Methadone, urine                   Cutoff 300 ng/mL The urine drug screen provides only a preliminary, unconfirmed analytical test result and should not be used for non-medical purposes. Clinical consideration and professional judgment should be applied to any positive drug screen result due to possible interfering substances. A more specific alternate chemical method must be used in order to obtain a confirmed analytical result. Gas chromatography / mass spectrometry (GC/MS) is the preferred confirmat ory method. Performed at [facility], [address] [place], [place] [address]   Ethanol     Status: None   Collection Time: 09/11/19 10:06 PM  Result Value Ref  Range   Alcohol, Ethyl (B) <10 <10 mg/dL    Comment: (NOTE) Lowest detectable limit for serum alcohol is 10 mg/dL. For medical purposes only. Performed at [facility], [address] [place], [place] [address]   SARS Coronavirus 2 [facility] order, Performed in [facility] hospital lab) Nasopharyngeal Nasopharyngeal Swab     Status: None   Collection Time: 09/12/19  1:45 AM   Specimen: Nasopharyngeal Swab  Result Value Ref Range   SARS Coronavirus 2 NEGATIVE NEGATIVE  Comment: (NOTE) If result is NEGATIVE SARS-CoV-2 target nucleic acids are NOT DETECTED. The SARS-CoV-2 RNA is generally detectable in upper and lower  respiratory specimens during the acute phase of infection. The lowest  concentration of SARS-CoV-2 viral copies this assay can detect is 250  copies / mL. A negative result does not preclude SARS-CoV-2 infection  and should not be used as the sole basis for treatment or other  patient management decisions.  A negative result may occur with  improper specimen collection / handling, submission of specimen other  than nasopharyngeal swab, presence of viral mutation(s) within the  areas targeted by this assay, and inadequate number of viral copies  (<250 copies / mL). A negative result must be combined with clinical  observations, patient history, and epidemiological information. If result is POSITIVE SARS-CoV-2 target nucleic acids are DETECTED. The SARS-CoV-2 RNA is generally detectable in upper and lower  respiratory specimens dur ing the acute phase of infection.  Positive  results are indicative of active infection with SARS-CoV-2.  Clinical  correlation with patient history and other diagnostic information is  necessary to determine patient infection status.  Positive results do  not rule out bacterial infection or co-infection with other viruses. If result is PRESUMPTIVE POSTIVE SARS-CoV-2 nucleic acids MAY BE PRESENT.   A presumptive positive result  was obtained on the submitted specimen  and confirmed on repeat testing.  While 2019 novel coronavirus  (SARS-CoV-2) nucleic acids may be present in the submitted sample  additional confirmatory testing may be necessary for epidemiological  and / or clinical management purposes  to differentiate between  SARS-CoV-2 and other Sarbecovirus currently known to infect humans.  If clinically indicated additional testing with an alternate test  methodology [phone]) is advised. The SARS-CoV-2 RNA is generally  detectable in upper and lower respiratory sp ecimens during the acute  phase of infection. The expected result is Negative. Fact Sheet for Patients:  StrictlyIdeas.no Fact Sheet for Healthcare Providers: BankingDealers.co.za This test is not yet approved or cleared by the [place] FDA and has been authorized for detection and/or diagnosis of SARS-CoV-2 by FDA under an Emergency Use Authorization (EUA).  This EUA will remain in effect (meaning this test can be used) for the duration of the COVID-19 declaration under Section 564(b)(1) of the Act, 21 U.S.C. section 360bbb-3(b)(1), unless the authorization is terminated or revoked sooner. Performed at [facility], [address] [place], [place] [address]     Current Facility-Administered Medications  Medication Dose Route Frequency Provider Last Rate Last Dose  . NON FORMULARY 5 mL  5 mL Oral QID PRN Tonia Ghent, MD       Current Outpatient Medications  Medication Sig Dispense Refill  . albuterol (VENTOLIN HFA) 108 (90 Base) MCG/ACT inhaler Inhale 2 puffs into the lungs every 6 (six) hours as needed for wheezing or shortness of breath. 18 g 11  . ascorbic acid (VITAMIN C) 500 MG tablet Take 1 tablet (500 mg total) by mouth daily. (Patient not taking: Reported on 08/27/2019) 90 tablet 3  . cholecalciferol (VITAMIN D) 25 MCG (1000 UT) tablet Take 1 tablet (1,000 Units  total) by mouth daily. (Patient not taking: Reported on 08/27/2019) 90 tablet 3  . Diphenhyd-Hydrocort-Nystatin (FIRST-DUKES MOUTHWASH) SUSP Use as directed 5 mLs in the mouth or throat 4 (four) times daily as needed. 120 mL 0  . fluocinonide cream (LIDEX) [id number] % Apply 1 application topically 2 (two) times daily as needed. 30 g 0  .  hydrocerin (EUCERIN) CREA Apply 1 application topically 2 (two) times daily. 454 g 0  . hydrOXYzine (ATARAX/VISTARIL) 50 MG tablet Take 1 tablet (50 mg total) by mouth every 6 (six) hours as needed.    . lamoTRIgine (LAMICTAL) 25 MG tablet Take 2 tablets (50 mg total) by mouth daily. 60 tablet 1  . levothyroxine (SYNTHROID) 88 MCG tablet Take 1 tablet (88 mcg total) by mouth daily before breakfast. 90 tablet 3  . magnesium oxide (MAG-OX) 400 MG tablet Take 400 mg by mouth daily.    . Multiple Vitamin (MULTIVITAMIN) tablet Take 1 tablet by mouth daily. (Patient not taking: Reported on 08/27/2019) 90 tablet 3  . nitrofurantoin, macrocrystal-monohydrate, (MACROBID) 100 MG capsule Take 100 mg by mouth 2 (two) times daily.    Marland Kitchen OLANZapine (ZYPREXA) 15 MG tablet Take 15 mg by mouth daily.    . Omega-3 Fatty Acids (FISH OIL) 1000 MG CAPS Take 1 capsule (1,000 mg total) by mouth daily. (Patient not taking: Reported on 08/27/2019) 90 capsule 3  . omeprazole (PRILOSEC) 20 MG capsule Take 1 capsule (20 mg total) by mouth daily. 90 capsule 3  . ondansetron (ZOFRAN-ODT) 4 MG disintegrating tablet Take 1 tablet (4 mg total) by mouth 3 (three) times daily as needed for nausea or vomiting. 30 tablet 1  . propranolol (INDERAL) 10 MG tablet Take 1 tablet (10 mg total) by mouth 2 (two) times daily. 180 tablet 3  . senna-docusate (SENOKOT-S) 8.6-50 MG tablet Take 2 tablets by mouth daily. 180 tablet 3  . SENOKOT 8.6 MG tablet Take 2 tablets by mouth daily.    Marland Kitchen tiZANidine (ZANAFLEX) 4 MG tablet Take 1 tablet (4 mg total) by mouth every 8 (eight) hours as needed for muscle spasms. 90 tablet 1  .  valACYclovir (VALTREX) 500 MG tablet TAKE 1 TABLET(500 MG) BY MOUTH TWICE DAILY 60 tablet 5  . vitamin B-12 (CYANOCOBALAMIN) 1000 MCG tablet Take 1 tablet (1,000 mcg total) by mouth daily. (Patient not taking: Reported on 08/27/2019) 90 tablet 3    Musculoskeletal: Strength & Muscle Tone: decreased Gait & Station: unsteady Patient leans: N/A  Psychiatric Specialty Exam: Physical Exam  Nursing note and vitals reviewed. Constitutional: She is oriented to person, place, and time. She appears well-developed.  HENT:  Head: Normocephalic.  Eyes: Pupils are equal, round, and reactive to light.  Neck: Normal range of motion. Neck supple.  Cardiovascular: Normal rate.  Respiratory: Effort normal.  Musculoskeletal: Normal range of motion.  Neurological: She is alert and oriented to person, place, and time.  Skin: Skin is warm and dry.    Review of Systems  Constitutional: Positive for malaise/fatigue and weight loss.  HENT: Negative.   Eyes: Negative.   Respiratory: Negative.   Cardiovascular: Negative.   Gastrointestinal: Negative.   Genitourinary: Negative.   Musculoskeletal: Positive for joint pain and myalgias.  Skin: Negative.   Neurological: Negative.   Endo/Heme/Allergies: Negative.   Psychiatric/Behavioral: Positive for depression. The patient is nervous/anxious.        Poor appetite    Blood pressure 119/72, pulse 85, temperature 97.9 F (36.6 C), temperature source Oral, resp. rate 14, height 5' (1.524 m), weight 45.4 kg, SpO2 98 %.Body mass index is 19.53 kg/m.  General Appearance: Disheveled  Eye Contact:  Fair  Speech:  Clear and Coherent and rapid  Volume:  Normal  Mood:  Anxious  Affect:  Depressed  Thought Process:  Coherent  Orientation:  Full (Time, Place, and Person)  Thought Content:  Paranoid Ideation  Suicidal Thoughts:  No  Homicidal Thoughts:  No  Memory:  Immediate;   Good Recent;   Good Remote;   Good  Judgement:  Fair  Insight:  Fair   Psychomotor Activity:  Decreased  Concentration:  Concentration: Fair  Recall:  [employer] of Knowledge:  Fair  Language:  Fair  Akathisia:  No  Handed:    AIMS (if indicated):     Assets:  Desire for Improvement Financial Resources/Insurance Housing Resilience Social Support Transportation  ADL's:  Intact  Cognition:  WNL  Sleep:        Treatment Plan Summary: Daily contact with patient to assess and evaluate symptoms and progress in treatment and Medication management  Disposition: Patient should be observed overnight and reassess in the a.m. to determine if she meets criteria for psychiatric inpatient admission or could be discharged back home.  Caroline Sauger, NP 09/12/2019 6:32 AM

## 2019-09-12 NOTE — ED Notes (Signed)
PT transferred to [facility]

## 2019-09-12 NOTE — BH Assessment (Signed)
Patient has been accepted to [facility].  Patient assigned to [facility] Accepting physician is Dr. Alcide Clever.  Call report to [phone].  Representative was [place].   ER Staff is aware of it:  Nitchia, ER Secretary  Dr. Jari Pigg, ER MD  Amy T., Patient's Nurse

## 2019-09-12 NOTE — ED Notes (Signed)
Patient observed lying in bed with eyes closed  Even, unlabored respirations observed   NAD pt appears to be sleeping  I will continue to monitor along with every 15 minute visual observations and ongoing security camera monitoring    

## 2019-09-12 NOTE — ED Notes (Signed)
BEHAVIORAL HEALTH ROUNDING Patient sleeping: No. Patient alert and oriented: yes Behavior appropriate: Yes.  ; If no, describe:  Nutrition and fluids offered: yes Toileting and hygiene offered: Yes  Sitter present: q15 minute observations   ENVIRONMENTAL ASSESSMENT Potentially harmful objects out of patient reach: Yes.   Personal belongings secured: Yes.   Patient dressed in hospital provided attire only: Yes.   Plastic bags out of patient reach: Yes.   Patient care equipment (cords, cables, call bells, lines, and drains) shortened, removed, or accounted for: Yes.   Equipment and supplies removed from bottom of stretcher: Yes.   Potentially toxic materials out of patient reach: Yes.   Sharps container removed or out of patient reach: Yes.

## 2019-09-12 NOTE — Consult Note (Signed)
Patient is seen and examined.  Patient is a 63 year old female with a past psychiatric history significant for bipolar disorder, and her presentation currently is most likely secondary to delusional disorder.  She presented yesterday with recurrent thoughts of black mold in her apartment, and the inability to tolerate that.  She has had multiple emergency room visits for this, and was recently discharged from old [facility] on Zyprexa, Lamictal and doxepin.  The patient stated that each 1 of these medicines made her have a rash and want to scratch her skin.  Review of the electronic medical record revealed that the patient had been on Zyprexa Zydis for many years.  Dr. Weber Cooks had discharged her on that on their last admission here at [facility].  The patient stated that when she was discharged from old [facility] approximately 1 to 2 days ago she did not take medications after she had been discharged.  She stated there is nothing wrong with her mental health, and that no one listens to her about the contamination in her apartment.  She is asking to leave today so she can go to a friend of hers, and then leave the place that she lives because of the "black mold".  Review of the electronic medical record revealed that several of her admissions have been because of noncompliance with medications.  I asked her if she had ever been on Abilify before, and she had not.  We discussed the possibility of a long-acting injectable medication, and she is willing to take that if she is able to get back some of her other medicines including levothyroxine and Magic mouthwash.  She does have a history of hypothyroidism, and on her last admission was discharged on 88 mcg p.o. daily.  She is still delusional and believes that her home is contaminated and that she is also contaminated with these toxins.  Assessment and plan: #1 history of bipolar disorder, #2 delusional disorder I am going to start her on oral  Abilify.  We will start 5 mg p.o. daily.  If she tolerates this well then I would recommend the long-acting Abilify injection.  Hopefully this will temper her psychotic symptoms and we can perhaps keep her out of the emergency room and out of the hospital.  In the meantime we will send the information on her admission back over to [facility], and see if we can get her admitted to the hospital.  I will also restart her levothyroxine, Protonix, Magic mouthwash.  She will probably need something for sleep as well, and in the short run I will write for her Zyprexa Zydis 15 mg nightly to at least give the Abilify and opportunity to see if she can tolerate it. #1 Abilify 5 mg p.o. daily and titrate.  This is hopefully to be transition to the long-acting Abilify injection. 2.  Restart levothyroxine 88 mcg p.o. daily.  Her TSH on admission was approximately 3. 3.  Zyprexa Zydis 15 mg p.o. nightly in the short run. 4.  Magic mouthwash for palliative reasons. 5.  Protonix for reflux symptoms.

## 2019-09-12 NOTE — ED Notes (Signed)
Pt given meal tray.

## 2019-09-13 DIAGNOSIS — F22 Delusional disorders: Secondary | ICD-10-CM | POA: Insufficient documentation

## 2019-09-18 DIAGNOSIS — F450 Somatization disorder: Secondary | ICD-10-CM | POA: Diagnosis not present

## 2019-09-18 DIAGNOSIS — G20 Parkinson's disease: Secondary | ICD-10-CM | POA: Diagnosis not present

## 2019-09-18 DIAGNOSIS — F3177 Bipolar disorder, in partial remission, most recent episode mixed: Secondary | ICD-10-CM | POA: Diagnosis not present

## 2019-09-18 DIAGNOSIS — F411 Generalized anxiety disorder: Secondary | ICD-10-CM | POA: Diagnosis not present

## 2019-09-23 ENCOUNTER — Encounter: Payer: Self-pay | Admitting: Emergency Medicine

## 2019-09-23 ENCOUNTER — Other Ambulatory Visit: Payer: Self-pay

## 2019-09-23 ENCOUNTER — Emergency Department
Admission: EM | Admit: 2019-09-23 | Discharge: 2019-09-23 | Disposition: A | Discharge status: Home / Self Care | Payer: Medicare Other | Attending: Student in an Organized Health Care Education/Training Program | Admitting: Student in an Organized Health Care Education/Training Program

## 2019-09-23 ENCOUNTER — Emergency Department: Payer: Medicare Other

## 2019-09-23 DIAGNOSIS — R112 Nausea with vomiting, unspecified: Secondary | ICD-10-CM | POA: Insufficient documentation

## 2019-09-23 DIAGNOSIS — Z882 Allergy status to sulfonamides status: Secondary | ICD-10-CM | POA: Insufficient documentation

## 2019-09-23 DIAGNOSIS — E785 Hyperlipidemia, unspecified: Secondary | ICD-10-CM | POA: Insufficient documentation

## 2019-09-23 DIAGNOSIS — Z885 Allergy status to narcotic agent status: Secondary | ICD-10-CM | POA: Insufficient documentation

## 2019-09-23 DIAGNOSIS — R0600 Dyspnea, unspecified: Secondary | ICD-10-CM | POA: Diagnosis not present

## 2019-09-23 DIAGNOSIS — Z888 Allergy status to other drugs, medicaments and biological substances status: Secondary | ICD-10-CM | POA: Insufficient documentation

## 2019-09-23 DIAGNOSIS — Z886 Allergy status to analgesic agent status: Secondary | ICD-10-CM | POA: Insufficient documentation

## 2019-09-23 DIAGNOSIS — E039 Hypothyroidism, unspecified: Secondary | ICD-10-CM | POA: Diagnosis not present

## 2019-09-23 DIAGNOSIS — R52 Pain, unspecified: Secondary | ICD-10-CM | POA: Diagnosis not present

## 2019-09-23 DIAGNOSIS — F17210 Nicotine dependence, cigarettes, uncomplicated: Secondary | ICD-10-CM | POA: Insufficient documentation

## 2019-09-23 DIAGNOSIS — Z79899 Other long term (current) drug therapy: Secondary | ICD-10-CM | POA: Insufficient documentation

## 2019-09-23 DIAGNOSIS — R069 Unspecified abnormalities of breathing: Secondary | ICD-10-CM | POA: Diagnosis not present

## 2019-09-23 DIAGNOSIS — G20 Parkinson's disease: Secondary | ICD-10-CM | POA: Diagnosis not present

## 2019-09-23 DIAGNOSIS — R0602 Shortness of breath: Secondary | ICD-10-CM | POA: Diagnosis not present

## 2019-09-23 LAB — COMPREHENSIVE METABOLIC PANEL
ALT: 16 U/L (ref 0–44)
AST: 17 U/L (ref 15–41)
Albumin: 4 g/dL (ref 3.5–5.0)
Alkaline Phosphatase: 91 U/L (ref 38–126)
Anion gap: 11 (ref 5–15)
BUN: 10 mg/dL (ref 6–20)
CO2: 24 mmol/L (ref 22–32)
Calcium: 10.4 mg/dL — ABNORMAL HIGH (ref 8.9–10.3)
Chloride: 103 mmol/L (ref 98–111)
Creatinine, Ser: 0.65 mg/dL (ref 0.44–1.00)
GFR calc Af Amer: >60 mL/min (ref >60)
GFR calc non Af Amer: >60 mL/min (ref >60)
Glucose, Bld: 120 mg/dL — ABNORMAL HIGH (ref 70–99)
Potassium: 3.6 mmol/L (ref 3.5–5.1)
Sodium: 138 mmol/L (ref 135–145)
Total Bilirubin: 0.6 mg/dL (ref 0.3–1.2)
Total Protein: 6.9 g/dL (ref 6.5–8.1)

## 2019-09-23 LAB — CBC WITH DIFFERENTIAL/PLATELET
Abs Immature Granulocytes: 0.06 10*3/uL (ref 0.00–0.07)
Basophils Absolute: 0.1 10*3/uL (ref 0.0–0.1)
Basophils Relative: 1 %
Eosinophils Absolute: 0.2 10*3/uL (ref 0.0–0.5)
Eosinophils Relative: 2 %
HCT: 44.5 % (ref 36.0–46.0)
Hemoglobin: 14.8 g/dL (ref 12.0–15.0)
Immature Granulocytes: 1 %
Lymphocytes Relative: 29 %
Lymphs Abs: 3.1 10*3/uL (ref 0.7–4.0)
MCH: 30.9 pg (ref 26.0–34.0)
MCHC: 33.3 g/dL (ref 30.0–36.0)
MCV: 92.9 fL (ref 80.0–100.0)
Monocytes Absolute: 0.7 10*3/uL (ref 0.1–1.0)
Monocytes Relative: 6 %
Neutro Abs: 6.5 10*3/uL (ref 1.7–7.7)
Neutrophils Relative %: 61 %
Platelets: 258 10*3/uL (ref 150–400)
RBC: 4.79 MIL/uL (ref 3.87–5.11)
RDW: 13.5 % (ref 11.5–15.5)
WBC: 10.5 10*3/uL (ref 4.0–10.5)
nRBC: 0 % (ref 0.0–0.2)

## 2019-09-23 LAB — LIPASE, BLOOD: Lipase: 28 U/L (ref 11–51)

## 2019-09-23 MED ORDER — DROPERIDOL 2.5 MG/ML IJ SOLN
2.5000 mg | Freq: Once | INTRAMUSCULAR | Status: AC
  Administered 2019-09-23: 10:00:00 2.5 mg via INTRAVENOUS
  Filled 2019-09-23: qty 2

## 2019-09-23 MED ORDER — ACETAMINOPHEN 500 MG PO TABS
1000.0000 mg | ORAL_TABLET | Freq: Once | ORAL | Status: AC
  Administered 2019-09-23: 10:00:00 1000 mg via ORAL
  Filled 2019-09-23: qty 2

## 2019-09-23 MED ORDER — PROMETHAZINE HCL 25 MG/ML IJ SOLN: 12.5000 mg | Freq: Four times a day (QID) | INTRAMUSCULAR | Status: DC | PRN

## 2019-09-23 NOTE — ED Notes (Signed)
Attempted to give pt a bus pass. Pt guardian / daughter states it is 5 miles from bus stop to pt residence and pt unable to walk that far. Charge notified and is checking options for pt transportation

## 2019-09-23 NOTE — ED Triage Notes (Signed)
States SOB x 1 month. States nausea and vomiting since yesterday. Denies chest pain. Generalized pain.

## 2019-09-23 NOTE — ED Notes (Signed)
Heather charge Radio producer believes pt is safe to be d/c home with a taxi voucher. Pt signed paper copy of d/c consent. [employer] contacted by this RN and states they will be here shortly. Pt discharged at escorted to lobby to wait for taxi to arrive.

## 2019-09-23 NOTE — ED Notes (Signed)
Per pharmacy will not release inapsine until CMP resulted. Await results.

## 2019-09-23 NOTE — ED Notes (Addendum)
RN contacted pt daughter who is legal guardian. Daughter states that she is out of town and unable to pick up pt from ED. RN attempted to contact family member Dyane Dustman at [phone], no answer. Contacted daughter again and  daughter to call a family member to arrange transport, and will call this RN back.

## 2019-09-23 NOTE — Social Work (Addendum)
CSW spoke with charge nurse as far as safety. CSW shared that it is fine that patient be sent home by cab since legal guardian, and family members are not able to transport patient, and bus stop is 5 miles away from patient's home.  Patient will receive cab voucher.  Patient did not receive psych consult during this visit.  Patient medically cleared to leave.   [place], [place] [facility]  [phone]

## 2019-09-23 NOTE — ED Notes (Signed)
Pt asked for something to eat and drink. Pt given lemon lime soda. dietary contacted to get them to send up some sandwich trays at this time.

## 2019-09-23 NOTE — Discharge Instructions (Signed)

## 2019-09-23 NOTE — ED Notes (Signed)
Called daughter who pt reports to primary RN is her legal guardian.

## 2019-09-23 NOTE — ED Provider Notes (Signed)
[facility] Emergency Department Provider Note    First MD Initiated Contact with Patient 09/23/19 [phone]     (approximate)  I have reviewed the triage vital signs and the nursing notes.   HISTORY  Chief Complaint Shortness of Breath and Emesis    HPI Nyomii Sowada is a 63 y.o. female below listed past medical history just recently released from inpatient psychiatric hospital this past week presents the ER for 1 month of shortness of breath and chief complaint of nausea as well as acute on chronic neck pain.  States that she has a pinched nerve in her neck and her Parkinson's disease is making her symptoms worse.  Patient very anxious.  Denies any hallucinations SI or HI.  Denies any medication changes.  Denies any chest pain.  States that shortness of breath is unchanged from previous.  Does have a history of gastroparesis and feels like she is unable to keep anything down.    Past Medical History:  Diagnosis Date  . Back pain   . Cancer ([place])    skin  . Depression    BAD  . Gastroparesis   . GERD (gastroesophageal reflux disease)   . Hypothyroidism   . Insomnia   . Migraines   . Parkinson disease ([place])    per [facility], dx'd 2019  . Recurrent cold sores   . Shoulder bursitis   . Urge incontinence    Family History  Problem Relation Age of Onset  . Hypertension Mother   . Arthritis Mother   . Diabetes Mother   . Stroke Mother   . Cancer Father        lung  . Colon cancer Neg Hx   . Breast cancer Neg Hx    Past Surgical History:  Procedure Laterality Date  . ABDOMINAL HYSTERECTOMY  1999   total  . APPENDECTOMY    . ESOPHAGOGASTRODUODENOSCOPY  01/2006   negative except small hiatal hernia  . ESOPHAGOGASTRODUODENOSCOPY  02/24/2015   See report  . LAPAROSCOPY     for endometriosis  . OVARIAN CYST REMOVAL  1990   unilateral  . TONSILLECTOMY AND ADENOIDECTOMY     Patient Active Problem List   Diagnosis Date Noted  .  Delusional disorder ([place])   . Cannabis abuse 05/28/2019  . Bipolar 1 disorder, mixed, severe ([place]) 05/27/2019  . Opiate dependence ([place]) 04/25/2019  . Cracking skin 01/27/2019  . Delusion ([place]) 09/26/2018  . Night sweats 05/13/2018  . Pupil asymmetry 05/13/2018  . DDD (degenerative disc disease), cervical 04/23/2018  . Parkinson disease ([place])   . HLD (hyperlipidemia) 03/10/2016  . Advance care planning 03/10/2016  . Cervical radiculopathy 07/30/2015  . Osteopenia 04/18/2014  . Medicare annual wellness visit, subsequent 03/18/2014  . Shoulder pain 01/17/2013  . Chronic back pain 09/14/2011  . Hypercalcemia 03/25/2010  . INCONTINENCE, URGE 04/16/2009  . CARPAL TUNNEL SYNDROME, BILATERAL 09/24/2008  . URETHRAL STRICTURE 06/13/2007  . Hypothyroidism 06/05/2007  . Bipolar affective disorder, current episode hypomanic ([place]) 06/05/2007  . MIGRAINE HEADACHE 06/05/2007  . Gastroesophageal reflux disease with hiatal hernia 06/05/2007  . IRRITABLE BOWEL SYNDROME 06/05/2007  . FIBROCYSTIC BREAST DISEASE 06/05/2007      Prior to Admission medications   Medication Sig Start Date End Date Taking? Authorizing Provider  albuterol (VENTOLIN HFA) 108 (90 Base) MCG/ACT inhaler Inhale 2 puffs into the lungs every 6 (six) hours as needed for wheezing or shortness of breath. 07/25/19   Tonia Ghent, MD  ascorbic acid (VITAMIN C) 500 MG tablet Take 1 tablet (500 mg total) by mouth daily. Patient not taking: Reported on 08/27/2019 07/25/19   Tonia Ghent, MD  cholecalciferol (VITAMIN D) 25 MCG (1000 UT) tablet Take 1 tablet (1,000 Units total) by mouth daily. Patient not taking: Reported on 08/27/2019 07/25/19   Tonia Ghent, MD  Diphenhyd-Hydrocort-Nystatin (FIRST-DUKES MOUTHWASH) SUSP Use as directed 5 mLs in the mouth or throat 4 (four) times daily as needed. 08/23/19   Tonia Ghent, MD  doxepin (SINEQUAN) 25 MG capsule Take 50 mg by mouth at bedtime. 08/21/19   [provider]   fluocinonide cream (LIDEX) [id number] % Apply 1 application topically 2 (two) times daily as needed. 08/16/19   Tonia Ghent, MD  hydrocerin (EUCERIN) CREA Apply 1 application topically 2 (two) times daily. 04/29/19   Patrecia Pour, NP  hydrOXYzine (ATARAX/VISTARIL) 50 MG tablet Take 1 tablet (50 mg total) by mouth every 6 (six) hours as needed. 07/11/19   Tonia Ghent, MD  lamoTRIgine (LAMICTAL) 25 MG tablet Take 2 tablets (50 mg total) by mouth daily. 05/30/19   [name], [name], MD  levothyroxine (SYNTHROID) 88 MCG tablet Take 1 tablet (88 mcg total) by mouth daily before breakfast. 07/11/19   Tonia Ghent, MD  OLANZapine (ZYPREXA) 15 MG tablet Take 15 mg by mouth daily. 05/31/19   [provider]  Omega-3 Fatty Acids (FISH OIL) 1000 MG CAPS Take 1 capsule (1,000 mg total) by mouth daily. Patient not taking: Reported on 08/27/2019 07/25/19   Tonia Ghent, MD  omeprazole (PRILOSEC) 20 MG capsule Take 1 capsule (20 mg total) by mouth daily. 07/25/19   Tonia Ghent, MD  ondansetron (ZOFRAN-ODT) 4 MG disintegrating tablet Take 1 tablet (4 mg total) by mouth 3 (three) times daily as needed for nausea or vomiting. 07/26/19   Tonia Ghent, MD  propranolol (INDERAL) 10 MG tablet Take 1 tablet (10 mg total) by mouth 2 (two) times daily. 07/25/19   Tonia Ghent, MD  SENOKOT 8.6 MG tablet Take 2 tablets by mouth daily. 07/14/19   [provider]  tiZANidine (ZANAFLEX) 4 MG tablet Take 1 tablet (4 mg total) by mouth every 8 (eight) hours as needed for muscle spasms. 07/11/19   Tonia Ghent, MD  valACYclovir (VALTREX) 500 MG tablet TAKE 1 TABLET(500 MG) BY MOUTH TWICE DAILY 07/23/19   Tonia Ghent, MD  vitamin B-12 (CYANOCOBALAMIN) 1000 MCG tablet Take 1 tablet (1,000 mcg total) by mouth daily. Patient not taking: Reported on 08/27/2019 07/25/19   Tonia Ghent, MD  Vitamin D, Ergocalciferol, (DRISDOL) 1.25 MG (50000 UT) CAPS capsule Take 1 capsule by mouth once a week.     [provider]    Allergies Valproic acid, Aspirin, Atorvastatin, Baclofen, Codeine, Duloxetine, Erythromycin, Gabapentin, Metoclopramide hcl, Nortriptyline, Nsaids, Nucynta er [tapentadol hcl er], Nucynta [tapentadol], Olanzapine, Prednisone, Pregabalin, Seroquel [quetiapine fumerate], Sulfa antibiotics, Topamax, and Vicodin [hydrocodone-acetaminophen]    Social History Social History   Tobacco Use  . Smoking status: Current Some Day Smoker    Packs/day: 1.00    Years: 42.50    Pack years: 42.50    Types: Cigarettes  . Smokeless tobacco: Former Network engineer Use Topics  . Alcohol use: No    Alcohol/week: 0.0 standard drinks  . Drug use: No    Review of Systems Patient denies headaches, rhinorrhea, blurry vision, numbness, shortness of breath, chest pain, edema, cough, abdominal pain, nausea,  vomiting, diarrhea, dysuria, fevers, rashes or hallucinations unless otherwise stated above in HPI. ____________________________________________   PHYSICAL EXAM:  VITAL SIGNS: Vitals:   09/23/19 0923 09/23/19 1053  BP: 112/84 121/81  Pulse: 61 78  Resp: 18 18  Temp:    SpO2: 95% 98%    Constitutional: Alert and oriented. Anxious but cooperative Eyes: Conjunctivae are normal.  Head: Atraumatic. Nose: No congestion/rhinnorhea. Mouth/Throat: Mucous membranes are moist.   Neck: No stridor. Painless ROM.  Cardiovascular: Normal rate, regular rhythm. Grossly normal heart sounds.  Good peripheral circulation. Respiratory: Normal respiratory effort.  No retractions. Lungs CTAB. Gastrointestinal: Soft and nontender. No distention. No abdominal bruits. No CVA tenderness. Genitourinary:  Musculoskeletal: No lower extremity tenderness nor edema.  No joint effusions. Neurologic:  Normal speech and language. No gross focal neurologic deficits are appreciated. No facial droop Skin:  Skin is warm, dry and intact. No rash noted. Psychiatric: Mood and affect are anxious.   Cooperative with examination, denies SI or HI  ____________________________________________   LABS (all labs ordered are listed, but only abnormal results are displayed)  Results for orders placed or performed during the hospital encounter of 09/23/19 (from the past 24 hour(s))  CBC with Differential/Platelet     Status: None   Collection Time: 09/23/19  8:55 AM  Result Value Ref Range   WBC 10.5 4.0 - 10.5 K/uL   RBC 4.79 3.87 - 5.11 MIL/uL   Hemoglobin 14.8 12.0 - 15.0 g/dL   HCT 44.5 36.0 - 46.0 %   MCV 92.9 80.0 - 100.0 fL   MCH 30.9 26.0 - 34.0 pg   MCHC 33.3 30.0 - 36.0 g/dL   RDW 13.5 11.5 - 15.5 %   Platelets 258 150 - 400 K/uL   nRBC 0.0 0.0 - 0.2 %   Neutrophils Relative % 61 %   Neutro Abs 6.5 1.7 - 7.7 K/uL   Lymphocytes Relative 29 %   Lymphs Abs 3.1 0.7 - 4.0 K/uL   Monocytes Relative 6 %   Monocytes Absolute 0.7 0.1 - 1.0 K/uL   Eosinophils Relative 2 %   Eosinophils Absolute 0.2 0.0 - 0.5 K/uL   Basophils Relative 1 %   Basophils Absolute 0.1 0.0 - 0.1 K/uL   Immature Granulocytes 1 %   Abs Immature Granulocytes 0.06 0.00 - 0.07 K/uL  Comprehensive metabolic panel     Status: Abnormal   Collection Time: 09/23/19  8:55 AM  Result Value Ref Range   Sodium 138 135 - 145 mmol/L   Potassium 3.6 3.5 - 5.1 mmol/L   Chloride 103 98 - 111 mmol/L   CO2 24 22 - 32 mmol/L   Glucose, Bld 120 (H) 70 - 99 mg/dL   BUN 10 6 - 20 mg/dL   Creatinine, Ser 0.65 0.44 - 1.00 mg/dL   Calcium 10.4 (H) 8.9 - 10.3 mg/dL   Total Protein 6.9 6.5 - 8.1 g/dL   Albumin 4.0 3.5 - 5.0 g/dL   AST 17 15 - 41 U/L   ALT 16 0 - 44 U/L   Alkaline Phosphatase 91 38 - 126 U/L   Total Bilirubin 0.6 0.3 - 1.2 mg/dL   GFR calc non Af Amer >60 >60 mL/min   GFR calc Af Amer >60 >60 mL/min   Anion gap 11 5 - 15   ____________________________________________  EKG My review and personal interpretation at Time: 8:43   Indication: sob  Rate: 70  Rhythm: sinus Axis: normal Other: normal  intervals, no stemi ____________________________________________  RADIOLOGY  I personally reviewed all radiographic images ordered to evaluate for the above acute complaints and reviewed radiology reports and findings.  These findings were personally discussed with the patient.  Please see medical record for radiology report.  ____________________________________________   PROCEDURES  Procedure(s) performed:  Procedures    Critical Care performed: no ____________________________________________   INITIAL IMPRESSION / ASSESSMENT AND PLAN / ED COURSE  Pertinent labs & imaging results that were available during my care of the patient were reviewed by me and considered in my medical decision making (see chart for details).   DDX: Psychosis, delirium, medication effect, noncompliance, polysubstance abuse, Si, Hi, depression   Marian Kensleigh Shawver is a 63 y.o. who presents to the ED with symptoms as described above.  Her exam is reassuring she is afebrile and hemodynamically stable.  She is listless was to be taking Zyprexa and is telling me that she is now allergic to that medication.  I do have a high suspicion that she is not taking her medications but I do not see any indication for IVC.  Will evaluate for metabolic abnormality.  We will give antiemetic.    Clinical Course as of Sep 22 1240  Mon Sep 23, 2019  1239 Patient tolerating p.o.  Symptoms improved after antiemetic.  States that she has Zofran at home.  At this point I do believe she stable and appropriate for outpatient follow-up.   [PR]    Clinical Course User Index [PR] Merlyn Lot, MD    The patient was evaluated in Emergency Department today for the symptoms described in the history of present illness. He/she was evaluated in the context of the global COVID-19 pandemic, which necessitated consideration that the patient might be at risk for infection with the SARS-CoV-2 virus that causes COVID-19.  Institutional protocols and algorithms that pertain to the evaluation of patients at risk for COVID-19 are in a state of rapid change based on information released by regulatory bodies including the CDC and federal and state organizations. These policies and algorithms were followed during the patient's care in the ED.  As part of my medical decision making, I reviewed the following data within the [place] notes reviewed and incorporated, Labs reviewed, notes from prior ED visits and [place] Controlled Substance Database   ____________________________________________   FINAL CLINICAL IMPRESSION(S) / ED DIAGNOSES  Final diagnoses:  Non-intractable vomiting with nausea, unspecified vomiting type      NEW MEDICATIONS STARTED DURING THIS VISIT:  New Prescriptions   No medications on file     Note:  This document was prepared using Dragon voice recognition software and may include unintentional dictation errors.    Merlyn Lot, MD 09/23/19 [phone]

## 2019-09-23 NOTE — ED Notes (Signed)
Pt given [place] sandwich tray and another lemon lime soda at this time.

## 2019-09-23 NOTE — ED Notes (Signed)
Nausea decreased, apple juice taken.

## 2019-09-24 ENCOUNTER — Telehealth: Payer: Self-pay | Admitting: Family Medicine

## 2019-09-24 NOTE — Telephone Encounter (Signed)
Please call Shella Spearing. Dowdle with psychiatry and make sure she is aware patient had ER eval and needs psych f/u if not already arranged.  Thanks.    [facility] office  [address] [place],  [address]  [phone]

## 2019-09-24 NOTE — Telephone Encounter (Signed)
Called the [facility].  They had no ER notes and no access to Care Everywhere.  Last 3 ER notes faxed to the clinic at [phone]

## 2019-09-25 ENCOUNTER — Telehealth: Payer: Self-pay | Admitting: Family Medicine

## 2019-09-25 NOTE — Telephone Encounter (Signed)
I called Marrow with [facility] and left v/m for Marrow to call [place].

## 2019-09-25 NOTE — Telephone Encounter (Signed)
I called Marrow with [facility] and left v/m requesting cb.

## 2019-09-25 NOTE — Telephone Encounter (Signed)
Marrow @[facility] called  Best numbert [phone]  She saw pt last week.  She is referring her to ACT team.  Pt is unable Marrow thinks she needs more care

## 2019-09-25 NOTE — Telephone Encounter (Signed)
With me being out of the office today, please see what details you can get in the meantime from Marrow.  Please let me know what I can do otherwise.  Thanks.

## 2019-09-26 NOTE — Telephone Encounter (Signed)
Orland Mustard called back and states that Dr. Damita Dunnings does not need to do anything.  She will be setting up the patient for [facility] who will monitor the patient weekly or every other week to monitor medications, mood, etc.  Orland Mustard states that the patient seems to do well at times and then bottom out and she believes this may be due to inconsistent medication administration.  Orland Mustard states that patient misses quite a few appointments with their office also.  Orland Mustard will set the plans in motion for [facility].

## 2019-09-27 NOTE — Telephone Encounter (Signed)
Noted. Thanks.  Appreciate the help of all involved. °

## 2019-10-07 ENCOUNTER — Other Ambulatory Visit: Payer: Self-pay | Admitting: Family Medicine

## 2019-11-24 ENCOUNTER — Emergency Department
Admission: EM | Admit: 2019-11-24 | Discharge: 2019-11-24 | Disposition: A | Discharge status: Home / Self Care | Payer: Medicare Other | Attending: Emergency Medicine | Admitting: Emergency Medicine

## 2019-11-24 ENCOUNTER — Other Ambulatory Visit: Payer: Self-pay

## 2019-11-24 ENCOUNTER — Encounter: Payer: Self-pay | Admitting: Emergency Medicine

## 2019-11-24 DIAGNOSIS — F17210 Nicotine dependence, cigarettes, uncomplicated: Secondary | ICD-10-CM | POA: Diagnosis not present

## 2019-11-24 DIAGNOSIS — R110 Nausea: Secondary | ICD-10-CM | POA: Diagnosis not present

## 2019-11-24 DIAGNOSIS — Z85828 Personal history of other malignant neoplasm of skin: Secondary | ICD-10-CM | POA: Diagnosis not present

## 2019-11-24 DIAGNOSIS — G20 Parkinson's disease: Secondary | ICD-10-CM | POA: Diagnosis not present

## 2019-11-24 DIAGNOSIS — F29 Unspecified psychosis not due to a substance or known physiological condition: Secondary | ICD-10-CM | POA: Diagnosis not present

## 2019-11-24 DIAGNOSIS — Z79899 Other long term (current) drug therapy: Secondary | ICD-10-CM | POA: Insufficient documentation

## 2019-11-24 DIAGNOSIS — E039 Hypothyroidism, unspecified: Secondary | ICD-10-CM | POA: Diagnosis not present

## 2019-11-24 DIAGNOSIS — E860 Dehydration: Secondary | ICD-10-CM | POA: Diagnosis not present

## 2019-11-24 DIAGNOSIS — I10 Essential (primary) hypertension: Secondary | ICD-10-CM | POA: Diagnosis not present

## 2019-11-24 DIAGNOSIS — W19XXXA Unspecified fall, initial encounter: Secondary | ICD-10-CM | POA: Diagnosis not present

## 2019-11-24 DIAGNOSIS — R5381 Other malaise: Secondary | ICD-10-CM | POA: Diagnosis not present

## 2019-11-24 LAB — BASIC METABOLIC PANEL
Anion gap: 12 (ref 5–15)
BUN: 7 mg/dL (ref 6–20)
CO2: 23 mmol/L (ref 22–32)
Calcium: 10 mg/dL (ref 8.9–10.3)
Chloride: 99 mmol/L (ref 98–111)
Creatinine, Ser: 0.64 mg/dL (ref 0.44–1.00)
GFR calc Af Amer: >60 mL/min (ref >60)
GFR calc non Af Amer: >60 mL/min (ref >60)
Glucose, Bld: 107 mg/dL — ABNORMAL HIGH (ref 70–99)
Potassium: 3.2 mmol/L — ABNORMAL LOW (ref 3.5–5.1)
Sodium: 134 mmol/L — ABNORMAL LOW (ref 135–145)

## 2019-11-24 LAB — CBC
HCT: 39.6 % (ref 36.0–46.0)
Hemoglobin: 13.7 g/dL (ref 12.0–15.0)
MCH: 32 pg (ref 26.0–34.0)
MCHC: 34.6 g/dL (ref 30.0–36.0)
MCV: 92.5 fL (ref 80.0–100.0)
Platelets: 220 10*3/uL (ref 150–400)
RBC: 4.28 MIL/uL (ref 3.87–5.11)
RDW: 13.2 % (ref 11.5–15.5)
WBC: 11.8 10*3/uL — ABNORMAL HIGH (ref 4.0–10.5)
nRBC: 0 % (ref 0.0–0.2)

## 2019-11-24 LAB — URINALYSIS, COMPLETE (UACMP) WITH MICROSCOPIC
Bacteria, UA: NONE SEEN
Bilirubin Urine: NEGATIVE
Glucose, UA: NEGATIVE mg/dL
Ketones, ur: NEGATIVE mg/dL
Leukocytes,Ua: NEGATIVE
Nitrite: NEGATIVE
Protein, ur: NEGATIVE mg/dL
Specific Gravity, Urine: 1 — ABNORMAL LOW (ref 1.005–1.030)
Squamous Epithelial / HPF: NONE SEEN (ref 0–5)
pH: 6 (ref 5.0–8.0)

## 2019-11-24 LAB — GLUCOSE, CAPILLARY: Glucose-Capillary: 99 mg/dL (ref 70–99)

## 2019-11-24 MED ORDER — ALUM & MAG HYDROXIDE-SIMETH 200-200-20 MG/5ML PO SUSP
30.0000 mL | Freq: Once | ORAL | Status: AC
  Administered 2019-11-24: 30 mL via ORAL
  Filled 2019-11-24: qty 30

## 2019-11-24 MED ORDER — LIDOCAINE VISCOUS HCL 2 % MT SOLN
15.0000 mL | Freq: Once | OROMUCOSAL | Status: AC
  Administered 2019-11-24: 15 mL via ORAL
  Filled 2019-11-24: qty 15

## 2019-11-24 MED ORDER — ONDANSETRON 4 MG PO TBDP
4.0000 mg | ORAL_TABLET | Freq: Once | ORAL | Status: AC
  Administered 2019-11-24: 22:00:00 4 mg via ORAL
  Filled 2019-11-24: qty 1

## 2019-11-24 NOTE — ED Notes (Signed)
Pt assisted to the bathroom by this RN. Pt tolerated well. Pt back to subwait at this time.

## 2019-11-24 NOTE — ED Notes (Signed)
Pt states "I could use a good meal". Pt repeatedly states she is hungry.

## 2019-11-24 NOTE — ED Provider Notes (Signed)
[facility] Emergency Department Provider Note   ____________________________________________    I have reviewed the triage vital signs and the nursing notes.   HISTORY  Chief Complaint Weakness, Emesis, and Dehydration     HPI Leslie Wong is a 63 y.o. female who presents with complaint of dehydration.  Patient reports that she has had decreased appetite the last several days, has had intermittent nausea, no abdominal pain.  No fevers or chills.  Has not take anything for this.  Does report a history of GERD as well and thinks that may be what is causing her symptoms.  No diarrhea  Past Medical History:  Diagnosis Date  . Back pain   . Cancer ([place])    skin  . Depression    BAD  . Gastroparesis   . GERD (gastroesophageal reflux disease)   . Hypothyroidism   . Insomnia   . Migraines   . Parkinson disease ([place])    per [place] [facility], dx'd 2019  . Recurrent cold sores   . Shoulder bursitis   . Urge incontinence     Patient Active Problem List   Diagnosis Date Noted  . Delusional disorder ([place])   . Cannabis abuse 05/28/2019  . Bipolar 1 disorder, mixed, severe ([place]) 05/27/2019  . Opiate dependence ([place]) 04/25/2019  . Cracking skin 01/27/2019  . Delusion ([place]) 09/26/2018  . Night sweats 05/13/2018  . Pupil asymmetry 05/13/2018  . DDD (degenerative disc disease), cervical 04/23/2018  . Parkinson disease ([place])   . HLD (hyperlipidemia) 03/10/2016  . Advance care planning 03/10/2016  . Cervical radiculopathy 07/30/2015  . Osteopenia 04/18/2014  . Medicare annual wellness visit, subsequent 03/18/2014  . Shoulder pain 01/17/2013  . Chronic back pain 09/14/2011  . Hypercalcemia 03/25/2010  . INCONTINENCE, URGE 04/16/2009  . CARPAL TUNNEL SYNDROME, BILATERAL 09/24/2008  . URETHRAL STRICTURE 06/13/2007  . Hypothyroidism 06/05/2007  . Bipolar affective disorder, current episode hypomanic ([place]) 06/05/2007  . MIGRAINE HEADACHE  06/05/2007  . Gastroesophageal reflux disease with hiatal hernia 06/05/2007  . IRRITABLE BOWEL SYNDROME 06/05/2007  . FIBROCYSTIC BREAST DISEASE 06/05/2007    Past Surgical History:  Procedure Laterality Date  . ABDOMINAL HYSTERECTOMY  1999   total  . APPENDECTOMY    . ESOPHAGOGASTRODUODENOSCOPY  01/2006   negative except small hiatal hernia  . ESOPHAGOGASTRODUODENOSCOPY  02/24/2015   See report  . LAPAROSCOPY     for endometriosis  . OVARIAN CYST REMOVAL  1990   unilateral  . TONSILLECTOMY AND ADENOIDECTOMY      Prior to Admission medications   Medication Sig Start Date End Date Taking? Authorizing Provider  albuterol (VENTOLIN HFA) 108 (90 Base) MCG/ACT inhaler Inhale 2 puffs into the lungs every 6 (six) hours as needed for wheezing or shortness of breath. 07/25/19   Tonia Ghent, MD  ascorbic acid (VITAMIN C) 500 MG tablet Take 1 tablet (500 mg total) by mouth daily. Patient not taking: Reported on 08/27/2019 07/25/19   Tonia Ghent, MD  cholecalciferol (VITAMIN D) 25 MCG (1000 UT) tablet Take 1 tablet (1,000 Units total) by mouth daily. Patient not taking: Reported on 08/27/2019 07/25/19   Tonia Ghent, MD  Diphenhyd-Hydrocort-Nystatin (FIRST-DUKES MOUTHWASH) SUSP Use as directed 5 mLs in the mouth or throat 4 (four) times daily as needed. 08/23/19   Tonia Ghent, MD  doxepin (SINEQUAN) 25 MG capsule Take 50 mg by mouth at bedtime. 08/21/19   [provider]  fluocinonide cream (LIDEX) 0.05 % Apply  1 application topically 2 (two) times daily as needed. 08/16/19   Tonia Ghent, MD  hydrocerin (EUCERIN) CREA Apply 1 application topically 2 (two) times daily. 04/29/19   Patrecia Pour, NP  hydrOXYzine (ATARAX/VISTARIL) 50 MG tablet Take 1 tablet (50 mg total) by mouth every 6 (six) hours as needed. 07/11/19   Tonia Ghent, MD  lamoTRIgine (LAMICTAL) 25 MG tablet Take 2 tablets (50 mg total) by mouth daily. 05/30/19   [name], [name], MD  levothyroxine  (SYNTHROID) 88 MCG tablet TAKE 1 TABLET(88 MCG) BY MOUTH DAILY 10/08/19   Tonia Ghent, MD  OLANZapine (ZYPREXA) 15 MG tablet Take 15 mg by mouth daily. 05/31/19   [provider]  Omega-3 Fatty Acids (FISH OIL) 1000 MG CAPS Take 1 capsule (1,000 mg total) by mouth daily. Patient not taking: Reported on 08/27/2019 07/25/19   Tonia Ghent, MD  omeprazole (PRILOSEC) 20 MG capsule Take 1 capsule (20 mg total) by mouth daily. 07/25/19   Tonia Ghent, MD  ondansetron (ZOFRAN-ODT) 4 MG disintegrating tablet Take 1 tablet (4 mg total) by mouth 3 (three) times daily as needed for nausea or vomiting. 07/26/19   Tonia Ghent, MD  propranolol (INDERAL) 10 MG tablet Take 1 tablet (10 mg total) by mouth 2 (two) times daily. 07/25/19   Tonia Ghent, MD  SENOKOT 8.6 MG tablet Take 2 tablets by mouth daily. 07/14/19   [provider]  tiZANidine (ZANAFLEX) 4 MG tablet Take 1 tablet (4 mg total) by mouth every 8 (eight) hours as needed for muscle spasms. 07/11/19   Tonia Ghent, MD  valACYclovir (VALTREX) 500 MG tablet TAKE 1 TABLET(500 MG) BY MOUTH TWICE DAILY 07/23/19   Tonia Ghent, MD  vitamin B-12 (CYANOCOBALAMIN) 1000 MCG tablet Take 1 tablet (1,000 mcg total) by mouth daily. Patient not taking: Reported on 08/27/2019 07/25/19   Tonia Ghent, MD  Vitamin D, Ergocalciferol, (DRISDOL) 1.25 MG (50000 UT) CAPS capsule Take 1 capsule by mouth once a week.    [provider]     Allergies Valproic acid, Aspirin, Atorvastatin, Baclofen, Codeine, Duloxetine, Erythromycin, Gabapentin, Metoclopramide hcl, Nortriptyline, Nsaids, Nucynta er [tapentadol hcl er], Nucynta [tapentadol], Olanzapine, Prednisone, Pregabalin, Seroquel [quetiapine fumerate], Sulfa antibiotics, Topamax, and Vicodin [hydrocodone-acetaminophen]  Family History  Problem Relation Age of Onset  . Hypertension Mother   . Arthritis Mother   . Diabetes Mother   . Stroke Mother   . Cancer Father         lung  . Colon cancer Neg Hx   . Breast cancer Neg Hx     Social History Social History   Tobacco Use  . Smoking status: Current Some Day Smoker    Packs/day: 1.00    Years: 42.50    Pack years: 42.50    Types: Cigarettes  . Smokeless tobacco: Former Network engineer Use Topics  . Alcohol use: No    Alcohol/week: 0.0 standard drinks  . Drug use: No    Review of Systems  Constitutional: No fever/chills Eyes: No visual changes.  ENT: No sore throat. Cardiovascular: Denies chest pain. Respiratory: Denies shortness of breath. Gastrointestinal: As above Genitourinary: Negative for dysuria. Musculoskeletal: Negative for back pain. Skin: Negative for rash. Neurological: Negative for headaches    ____________________________________________   PHYSICAL EXAM:  VITAL SIGNS: ED Triage Vitals  Enc Vitals Group     BP 11/24/19 1755 137/74     Pulse Rate 11/24/19 1755 81  Resp 11/24/19 1755 (!) 22     Temp 11/24/19 1755 98.2 F (36.8 C)     Temp Source 11/24/19 1755 Oral     SpO2 11/24/19 1755 96 %     Weight 11/24/19 1753 46.3 kg (102 lb)     Height 11/24/19 1753 1.499 m (4\' 11" )     Head Circumference --      Peak Flow --      Pain Score 11/24/19 1753 10     Pain Loc --      Pain Edu? --      Excl. in [employer]? --     Constitutional: Alert and oriented.   Nose: No congestion/rhinnorhea. Mouth/Throat: Mucous membranes are moist.    Cardiovascular: Normal rate, regular rhythm. Grossly normal heart sounds.  Good peripheral circulation. Respiratory: Normal respiratory effort.  No retractions. Lungs CTAB. Gastrointestinal: Soft and nontender. No distention.    Musculoskeletal: No lower extremity tenderness nor edema.  Warm and well perfused Neurologic:  Normal speech and language. No gross focal neurologic deficits are appreciated.  Skin:  Skin is warm, dry and intact. No rash noted. Psychiatric: Mood and affect are normal. Speech and behavior are normal.   ____________________________________________   LABS (all labs ordered are listed, but only abnormal results are displayed)  Labs Reviewed  BASIC METABOLIC PANEL - Abnormal; Notable for the following components:      Result Value   Sodium 134 (*)    Potassium 3.2 (*)    Glucose, Bld 107 (*)    All other components within normal limits  CBC - Abnormal; Notable for the following components:   WBC 11.8 (*)    All other components within normal limits  URINALYSIS, COMPLETE (UACMP) WITH MICROSCOPIC - Abnormal; Notable for the following components:   Color, Urine COLORLESS (*)    APPearance CLEAR (*)    Specific Gravity, Urine 1.000 (*)    Hgb urine dipstick MODERATE (*)    All other components within normal limits  GLUCOSE, CAPILLARY  CBG MONITORING, ED   ____________________________________________  EKG  None ____________________________________________  RADIOLOGY  None ____________________________________________   PROCEDURES  Procedure(s) performed: No  Procedures   Critical Care performed: No ____________________________________________   INITIAL IMPRESSION / ASSESSMENT AND PLAN / ED COURSE  Pertinent labs & imaging results that were available during my care of the patient were reviewed by me and considered in my medical decision making (see chart for details).  Patient presents with complaints of dehydration, vital signs, exam, labs overall quite reassuring.  Treated with IV fluids and GI cocktail which she reports helped significantly.  P.o. Zofran for any nausea.  No occasion for admission at this time, recommend outpatient follow-up with PCP, return precautions discussed    ____________________________________________   FINAL CLINICAL IMPRESSION(S) / ED DIAGNOSES  Final diagnoses:  Dehydration        Note:  This document was prepared using Dragon voice recognition software and may include unintentional dictation errors.   Lavonia Drafts, MD  11/24/19 [phone]

## 2019-11-24 NOTE — ED Triage Notes (Signed)
PT reports to being sick and unable to keep anything down, pt states she is weak as well.

## 2019-11-24 NOTE — ED Notes (Signed)
Pt is aox4.

## 2019-11-24 NOTE — ED Triage Notes (Signed)
Pt arrived via [facility], from home, c/o being sick x 1 week, dehydrated, N/V. Seen by EMS a few days ago did not want to be seen.  C/o being shaky, has had 600cc fluid with EMS PTA.   Per EMS pt has been off psych meds.  PT received 4mg  Zofran IV with EMS  18G L AC placed by [facility].  p-98 95% RA CBG 126, 97.9 oral  175/92

## 2019-12-10 ENCOUNTER — Other Ambulatory Visit: Payer: Self-pay

## 2019-12-10 ENCOUNTER — Emergency Department
Admission: EM | Admit: 2019-12-10 | Discharge: 2019-12-11 | Disposition: A | Discharge status: Home / Self Care | Payer: Medicare Other | Attending: Student in an Organized Health Care Education/Training Program | Admitting: Student in an Organized Health Care Education/Training Program

## 2019-12-10 ENCOUNTER — Encounter: Payer: Self-pay | Admitting: Emergency Medicine

## 2019-12-10 DIAGNOSIS — Z008 Encounter for other general examination: Secondary | ICD-10-CM | POA: Diagnosis not present

## 2019-12-10 DIAGNOSIS — Z85828 Personal history of other malignant neoplasm of skin: Secondary | ICD-10-CM | POA: Insufficient documentation

## 2019-12-10 DIAGNOSIS — F1120 Opioid dependence, uncomplicated: Secondary | ICD-10-CM | POA: Diagnosis present

## 2019-12-10 DIAGNOSIS — Z20828 Contact with and (suspected) exposure to other viral communicable diseases: Secondary | ICD-10-CM | POA: Diagnosis not present

## 2019-12-10 DIAGNOSIS — F17210 Nicotine dependence, cigarettes, uncomplicated: Secondary | ICD-10-CM | POA: Diagnosis not present

## 2019-12-10 DIAGNOSIS — N3941 Urge incontinence: Secondary | ICD-10-CM | POA: Diagnosis present

## 2019-12-10 DIAGNOSIS — R070 Pain in throat: Secondary | ICD-10-CM | POA: Insufficient documentation

## 2019-12-10 DIAGNOSIS — N35919 Unspecified urethral stricture, male, unspecified site: Secondary | ICD-10-CM | POA: Diagnosis present

## 2019-12-10 DIAGNOSIS — F319 Bipolar disorder, unspecified: Secondary | ICD-10-CM | POA: Diagnosis not present

## 2019-12-10 DIAGNOSIS — F32A Depression, unspecified: Secondary | ICD-10-CM | POA: Diagnosis present

## 2019-12-10 DIAGNOSIS — F22 Delusional disorders: Secondary | ICD-10-CM

## 2019-12-10 DIAGNOSIS — G20 Parkinson's disease: Secondary | ICD-10-CM | POA: Insufficient documentation

## 2019-12-10 DIAGNOSIS — F310 Bipolar disorder, current episode hypomanic: Secondary | ICD-10-CM | POA: Diagnosis present

## 2019-12-10 DIAGNOSIS — G8929 Other chronic pain: Secondary | ICD-10-CM | POA: Diagnosis present

## 2019-12-10 DIAGNOSIS — Z79899 Other long term (current) drug therapy: Secondary | ICD-10-CM | POA: Diagnosis not present

## 2019-12-10 DIAGNOSIS — M25519 Pain in unspecified shoulder: Secondary | ICD-10-CM | POA: Diagnosis present

## 2019-12-10 DIAGNOSIS — M549 Dorsalgia, unspecified: Secondary | ICD-10-CM | POA: Diagnosis present

## 2019-12-10 DIAGNOSIS — H5702 Anisocoria: Secondary | ICD-10-CM | POA: Diagnosis present

## 2019-12-10 DIAGNOSIS — F1210 Cannabis abuse, uncomplicated: Secondary | ICD-10-CM | POA: Diagnosis present

## 2019-12-10 DIAGNOSIS — N6019 Diffuse cystic mastopathy of unspecified breast: Secondary | ICD-10-CM | POA: Diagnosis present

## 2019-12-10 DIAGNOSIS — M8580 Other specified disorders of bone density and structure, unspecified site: Secondary | ICD-10-CM | POA: Diagnosis present

## 2019-12-10 DIAGNOSIS — R110 Nausea: Secondary | ICD-10-CM | POA: Diagnosis not present

## 2019-12-10 DIAGNOSIS — K219 Gastro-esophageal reflux disease without esophagitis: Secondary | ICD-10-CM

## 2019-12-10 DIAGNOSIS — G20A1 Parkinson's disease without dyskinesia, without mention of fluctuations: Secondary | ICD-10-CM | POA: Diagnosis present

## 2019-12-10 DIAGNOSIS — E039 Hypothyroidism, unspecified: Secondary | ICD-10-CM | POA: Diagnosis not present

## 2019-12-10 DIAGNOSIS — Z7189 Other specified counseling: Secondary | ICD-10-CM

## 2019-12-10 DIAGNOSIS — M5412 Radiculopathy, cervical region: Secondary | ICD-10-CM | POA: Diagnosis present

## 2019-12-10 DIAGNOSIS — F3163 Bipolar disorder, current episode mixed, severe, without psychotic features: Secondary | ICD-10-CM | POA: Diagnosis present

## 2019-12-10 DIAGNOSIS — F329 Major depressive disorder, single episode, unspecified: Secondary | ICD-10-CM | POA: Diagnosis present

## 2019-12-10 DIAGNOSIS — L989 Disorder of the skin and subcutaneous tissue, unspecified: Secondary | ICD-10-CM

## 2019-12-10 DIAGNOSIS — M5030 Other cervical disc degeneration, unspecified cervical region: Secondary | ICD-10-CM | POA: Diagnosis present

## 2019-12-10 DIAGNOSIS — R61 Generalized hyperhidrosis: Secondary | ICD-10-CM | POA: Diagnosis present

## 2019-12-10 DIAGNOSIS — Z0000 Encounter for general adult medical examination without abnormal findings: Secondary | ICD-10-CM

## 2019-12-10 LAB — BASIC METABOLIC PANEL
Anion gap: 13 (ref 5–15)
BUN: 23 mg/dL (ref 8–23)
CO2: 27 mmol/L (ref 22–32)
Calcium: 11 mg/dL — ABNORMAL HIGH (ref 8.9–10.3)
Chloride: 98 mmol/L (ref 98–111)
Creatinine, Ser: 1.01 mg/dL — ABNORMAL HIGH (ref 0.44–1.00)
GFR calc Af Amer: >60 mL/min (ref >60)
GFR calc non Af Amer: >60 mL/min (ref >60)
Glucose, Bld: 104 mg/dL — ABNORMAL HIGH (ref 70–99)
Potassium: 3.5 mmol/L (ref 3.5–5.1)
Sodium: 138 mmol/L (ref 135–145)

## 2019-12-10 LAB — HEPATIC FUNCTION PANEL
ALT: 12 U/L (ref 0–44)
AST: 17 U/L (ref 15–41)
Albumin: 4.2 g/dL (ref 3.5–5.0)
Alkaline Phosphatase: 65 U/L (ref 38–126)
Bilirubin, Direct: 0.2 mg/dL (ref 0.0–0.2)
Indirect Bilirubin: 0.7 mg/dL (ref 0.3–0.9)
Total Bilirubin: 0.9 mg/dL (ref 0.3–1.2)
Total Protein: 6.7 g/dL (ref 6.5–8.1)

## 2019-12-10 LAB — CBC
HCT: 45.3 % (ref 36.0–46.0)
Hemoglobin: 16.6 g/dL — ABNORMAL HIGH (ref 12.0–15.0)
MCH: 32.9 pg (ref 26.0–34.0)
MCHC: 36.6 g/dL — ABNORMAL HIGH (ref 30.0–36.0)
MCV: 89.9 fL (ref 80.0–100.0)
Platelets: 212 10*3/uL (ref 150–400)
RBC: 5.04 MIL/uL (ref 3.87–5.11)
RDW: 13.2 % (ref 11.5–15.5)
WBC: 9.7 10*3/uL (ref 4.0–10.5)
nRBC: 0 % (ref 0.0–0.2)

## 2019-12-10 LAB — LIPASE, BLOOD: Lipase: 32 U/L (ref 11–51)

## 2019-12-10 MED ORDER — ALUM & MAG HYDROXIDE-SIMETH 200-200-20 MG/5ML PO SUSP
30.0000 mL | Freq: Once | ORAL | Status: AC
  Administered 2019-12-10: 30 mL via ORAL
  Filled 2019-12-10: qty 30

## 2019-12-10 MED ORDER — LIDOCAINE VISCOUS HCL 2 % MT SOLN
15.0000 mL | Freq: Once | OROMUCOSAL | Status: AC
  Administered 2019-12-10: 17:00:00 15 mL via ORAL
  Filled 2019-12-10: qty 15

## 2019-12-10 MED ORDER — SODIUM CHLORIDE 0.9 % IV BOLUS
1000.0000 mL | Freq: Once | INTRAVENOUS | Status: AC
  Administered 2019-12-10: 1000 mL via INTRAVENOUS

## 2019-12-10 MED ORDER — HYDROXYZINE HCL 50 MG PO TABS: 50.0000 mg | ORAL_TABLET | Freq: Four times a day (QID) | ORAL | 0 refills | Status: DC | PRN

## 2019-12-10 MED ORDER — SUCRALFATE 1 G PO TABS
1.0000 g | ORAL_TABLET | Freq: Once | ORAL | Status: AC
  Administered 2019-12-10: 17:00:00 1 g via ORAL
  Filled 2019-12-10 (×2): qty 1

## 2019-12-10 MED ORDER — OMEPRAZOLE 20 MG PO CPDR: 20.0000 mg | DELAYED_RELEASE_CAPSULE | Freq: Every day | ORAL | 3 refills | Status: DC

## 2019-12-10 MED ORDER — LORAZEPAM 1 MG PO TABS
1.0000 mg | ORAL_TABLET | Freq: Once | ORAL | Status: AC
  Administered 2019-12-10: 19:00:00 1 mg via ORAL
  Filled 2019-12-10: qty 1

## 2019-12-10 NOTE — ED Provider Notes (Signed)
[facility] Emergency Department Provider Note    First MD Initiated Contact with Patient 12/10/19 1615     (approximate)  I have reviewed the triage vital signs and the nursing notes.   HISTORY  Chief Complaint Generalized Body Aches, Weakness, and Dehydration    HPI Zoel Picou is a 63 y.o. female bolus past medical history presents the ER for evaluation of nausea.  Patient feels like she cannot keep anything down.  Denies any abdominal pain.  States she feels dehydrated.  Patient very anxious appearing.  States the symptoms been of been ongoing for several months.  States that she takes omeprazole.  Feels like the acid comes back up into her throat.  Denies any other complaints.    Past Medical History:  Diagnosis Date  . Back pain   . Cancer ([place])    skin  . Depression    BAD  . Gastroparesis   . GERD (gastroesophageal reflux disease)   . Hypothyroidism   . Insomnia   . Migraines   . Parkinson disease ([place])    per [place] [facility], dx'd 2019  . Recurrent cold sores   . Shoulder bursitis   . Urge incontinence    Family History  Problem Relation Age of Onset  . Hypertension Mother   . Arthritis Mother   . Diabetes Mother   . Stroke Mother   . Cancer Father        lung  . Colon cancer Neg Hx   . Breast cancer Neg Hx    Past Surgical History:  Procedure Laterality Date  . ABDOMINAL HYSTERECTOMY  1999   total  . APPENDECTOMY    . ESOPHAGOGASTRODUODENOSCOPY  01/2006   negative except small hiatal hernia  . ESOPHAGOGASTRODUODENOSCOPY  02/24/2015   See report  . LAPAROSCOPY     for endometriosis  . OVARIAN CYST REMOVAL  1990   unilateral  . TONSILLECTOMY AND ADENOIDECTOMY     Patient Active Problem List   Diagnosis Date Noted  . Delusional disorder ([place])   . Cannabis abuse 05/28/2019  . Bipolar 1 disorder, mixed, severe ([place]) 05/27/2019  . Opiate dependence ([place]) 04/25/2019  . Cracking skin 01/27/2019  . Delusion  ([place]) 09/26/2018  . Night sweats 05/13/2018  . Pupil asymmetry 05/13/2018  . DDD (degenerative disc disease), cervical 04/23/2018  . Parkinson disease ([place])   . HLD (hyperlipidemia) 03/10/2016  . Advance care planning 03/10/2016  . Cervical radiculopathy 07/30/2015  . Osteopenia 04/18/2014  . Medicare annual wellness visit, subsequent 03/18/2014  . Shoulder pain 01/17/2013  . Chronic back pain 09/14/2011  . Hypercalcemia 03/25/2010  . INCONTINENCE, URGE 04/16/2009  . CARPAL TUNNEL SYNDROME, BILATERAL 09/24/2008  . URETHRAL STRICTURE 06/13/2007  . Hypothyroidism 06/05/2007  . Bipolar affective disorder, current episode hypomanic ([place]) 06/05/2007  . MIGRAINE HEADACHE 06/05/2007  . Gastroesophageal reflux disease with hiatal hernia 06/05/2007  . IRRITABLE BOWEL SYNDROME 06/05/2007  . FIBROCYSTIC BREAST DISEASE 06/05/2007      Prior to Admission medications   Medication Sig Start Date End Date Taking? Authorizing Provider  albuterol (VENTOLIN HFA) 108 (90 Base) MCG/ACT inhaler Inhale 2 puffs into the lungs every 6 (six) hours as needed for wheezing or shortness of breath. 07/25/19   Tonia Ghent, MD  ascorbic acid (VITAMIN C) 500 MG tablet Take 1 tablet (500 mg total) by mouth daily. Patient not taking: Reported on 08/27/2019 07/25/19   Tonia Ghent, MD  cholecalciferol (VITAMIN D) 25 MCG (1000 UT)  tablet Take 1 tablet (1,000 Units total) by mouth daily. Patient not taking: Reported on 08/27/2019 07/25/19   Tonia Ghent, MD  Diphenhyd-Hydrocort-Nystatin (FIRST-DUKES MOUTHWASH) SUSP Use as directed 5 mLs in the mouth or throat 4 (four) times daily as needed. 08/23/19   Tonia Ghent, MD  doxepin (SINEQUAN) 25 MG capsule Take 50 mg by mouth at bedtime. 08/21/19   [provider]  fluocinonide cream (LIDEX) [id number] % Apply 1 application topically 2 (two) times daily as needed. 08/16/19   Tonia Ghent, MD  hydrocerin (EUCERIN) CREA Apply 1 application topically 2 (two)  times daily. 04/29/19   Patrecia Pour, NP  hydrOXYzine (ATARAX/VISTARIL) 50 MG tablet Take 1 tablet (50 mg total) by mouth every 6 (six) hours as needed for anxiety. 12/10/19   Merlyn Lot, MD  lamoTRIgine (LAMICTAL) 25 MG tablet Take 2 tablets (50 mg total) by mouth daily. 05/30/19   [name], [name], MD  levothyroxine (SYNTHROID) 88 MCG tablet TAKE 1 TABLET(88 MCG) BY MOUTH DAILY 10/08/19   Tonia Ghent, MD  OLANZapine (ZYPREXA) 15 MG tablet Take 15 mg by mouth daily. 05/31/19   [provider]  Omega-3 Fatty Acids (FISH OIL) 1000 MG CAPS Take 1 capsule (1,000 mg total) by mouth daily. Patient not taking: Reported on 08/27/2019 07/25/19   Tonia Ghent, MD  omeprazole (PRILOSEC) 20 MG capsule Take 1 capsule (20 mg total) by mouth daily. 12/10/19   Merlyn Lot, MD  ondansetron (ZOFRAN-ODT) 4 MG disintegrating tablet Take 1 tablet (4 mg total) by mouth 3 (three) times daily as needed for nausea or vomiting. 07/26/19   Tonia Ghent, MD  propranolol (INDERAL) 10 MG tablet Take 1 tablet (10 mg total) by mouth 2 (two) times daily. 07/25/19   Tonia Ghent, MD  SENOKOT 8.6 MG tablet Take 2 tablets by mouth daily. 07/14/19   [provider]  tiZANidine (ZANAFLEX) 4 MG tablet Take 1 tablet (4 mg total) by mouth every 8 (eight) hours as needed for muscle spasms. 07/11/19   Tonia Ghent, MD  valACYclovir (VALTREX) 500 MG tablet TAKE 1 TABLET(500 MG) BY MOUTH TWICE DAILY 07/23/19   Tonia Ghent, MD  vitamin B-12 (CYANOCOBALAMIN) 1000 MCG tablet Take 1 tablet (1,000 mcg total) by mouth daily. Patient not taking: Reported on 08/27/2019 07/25/19   Tonia Ghent, MD  Vitamin D, Ergocalciferol, (DRISDOL) 1.25 MG (50000 UT) CAPS capsule Take 1 capsule by mouth once a week.    [provider]    Allergies Valproic acid, Aspirin, Atorvastatin, Baclofen, Codeine, Duloxetine, Erythromycin, Gabapentin, Metoclopramide hcl, Nortriptyline, Nsaids, Nucynta er [tapentadol hcl  er], Nucynta [tapentadol], Olanzapine, Prednisone, Pregabalin, Seroquel [quetiapine fumerate], Sulfa antibiotics, Topamax, and Vicodin [hydrocodone-acetaminophen]    Social History Social History   Tobacco Use  . Smoking status: Current Some Day Smoker    Packs/day: 1.00    Years: 42.50    Pack years: 42.50    Types: Cigarettes  . Smokeless tobacco: Former Network engineer Use Topics  . Alcohol use: No    Alcohol/week: 0.0 standard drinks  . Drug use: No    Review of Systems Patient denies headaches, rhinorrhea, blurry vision, numbness, shortness of breath, chest pain, edema, cough, abdominal pain, nausea, vomiting, diarrhea, dysuria, fevers, rashes or hallucinations unless otherwise stated above in HPI. ____________________________________________   PHYSICAL EXAM:  VITAL SIGNS: Vitals:   12/10/19 1444 12/10/19 1927  BP: 104/77 110/75  Pulse: 72 62  Resp: 18 18  Temp: 98.2 F (36.8 C)   SpO2: 96% 100%    Constitutional: Alert and oriented. Chronically ill appearing Eyes: Conjunctivae are normal.  Head: Atraumatic. Nose: No congestion/rhinnorhea. Mouth/Throat: Mucous membranes are moist.   Neck: No stridor. Painless ROM.  Cardiovascular: Normal rate, regular rhythm. Grossly normal heart sounds.  Good peripheral circulation. Respiratory: Normal respiratory effort.  No retractions. Lungs CTAB. Gastrointestinal: Soft and nontender. No distention. No abdominal bruits. No CVA tenderness. Genitourinary: deferred Musculoskeletal: No lower extremity tenderness nor edema.  No joint effusions. Neurologic:  Normal speech and language. No gross focal neurologic deficits are appreciated. No facial droop Skin:  Skin is warm, dry and intact. No rash noted. Psychiatric: Mood and affect are anxious. Speech and behavior are normal.  ____________________________________________   LABS (all labs ordered are listed, but only abnormal results are displayed)  Results for orders  placed or performed during the hospital encounter of 12/10/19 (from the past 24 hour(s))  Basic metabolic panel     Status: Abnormal   Collection Time: 12/10/19  2:48 PM  Result Value Ref Range   Sodium 138 135 - 145 mmol/L   Potassium 3.5 3.5 - 5.1 mmol/L   Chloride 98 98 - 111 mmol/L   CO2 27 22 - 32 mmol/L   Glucose, Bld 104 (H) 70 - 99 mg/dL   BUN 23 8 - 23 mg/dL   Creatinine, Ser 1.01 (H) 0.44 - 1.00 mg/dL   Calcium 11.0 (H) 8.9 - 10.3 mg/dL   GFR calc non Af Amer >60 >60 mL/min   GFR calc Af Amer >60 >60 mL/min   Anion gap 13 5 - 15  CBC     Status: Abnormal   Collection Time: 12/10/19  2:48 PM  Result Value Ref Range   WBC 9.7 4.0 - 10.5 K/uL   RBC 5.04 3.87 - 5.11 MIL/uL   Hemoglobin 16.6 (H) 12.0 - 15.0 g/dL   HCT 45.3 36.0 - 46.0 %   MCV 89.9 80.0 - 100.0 fL   MCH 32.9 26.0 - 34.0 pg   MCHC 36.6 (H) 30.0 - 36.0 g/dL   RDW 13.2 11.5 - 15.5 %   Platelets 212 150 - 400 K/uL   nRBC 0.0 0.0 - 0.2 %   ____________________________________________  EKG My review and personal interpretation at Time: 14:45   Indication: nausea  Rate: 70  Rhythm: sinus Axis: normal Other: normal intervals, no stemi ____________________________________________  RADIOLOGY   ____________________________________________   PROCEDURES  Procedure(s) performed:  Procedures    Critical Care performed: no ____________________________________________   INITIAL IMPRESSION / ASSESSMENT AND PLAN / ED COURSE  Pertinent labs & imaging results that were available during my care of the patient were reviewed by me and considered in my medical decision making (see chart for details).   DDX: Gastritis, enteritis, dehydration, SBO, pud, electrolyte abnormality, anxiety, paranoia, psychosis  Hamda Herro is a 63 y.o. who presents to the ED with symptoms as described above.  Patient afebrile hemodynamically stable.  Will give IV fluids.  Abdominal exam benign.  Will provide antiemetic  and reassess.  Clinical Course as of Dec 09 2126  Tue Dec 10, 2019  1924 Patient tolerating p.o. patient states that the symptoms have been ongoing for several weeks.  Discussed my recommendation for CT imaging to further evaluate.  Patient declining this.  States that she does feel very anxious.  Wonder if this is contributing to her nausea.  She received IV fluids.  Remains hemodynamically stable.  Will give Ativan.   [PR]  1957 Patient feels improved requesting discharge home.   [PR]  2028 Patient's daughter called stating that she would not pick the patient up stating that for the past several weeks the patient has been lying on her couch not eating stating that the food is "devoured wearing her.  "Has severe anhedonia stating that she wishes she were dead.  Reportedly has been not compliant with her medications.  Does not feel safe with her being home.  Given that history I do feel that psychiatric consultation indicated she very likely will require hospitalization.   [PR]    Clinical Course User Index [PR] Merlyn Lot, MD    The patient was evaluated in Emergency Department today for the symptoms described in the history of present illness. He/she was evaluated in the context of the global COVID-19 pandemic, which necessitated consideration that the patient might be at risk for infection with the SARS-CoV-2 virus that causes COVID-19. Institutional protocols and algorithms that pertain to the evaluation of patients at risk for COVID-19 are in a state of rapid change based on information released by regulatory bodies including the CDC and federal and state organizations. These policies and algorithms were followed during the patient's care in the ED.  As part of my medical decision making, I reviewed the following data within the [place] notes reviewed and incorporated, Labs reviewed, notes from prior ED visits and [place] Controlled Substance  Database   ____________________________________________   FINAL CLINICAL IMPRESSION(S) / ED DIAGNOSES  Final diagnoses:  Nausea  Acute paranoia ([place])      NEW MEDICATIONS STARTED DURING THIS VISIT:  Current Discharge Medication List       Note:  This document was prepared using Dragon voice recognition software and may include unintentional dictation errors.    Merlyn Lot, MD 12/10/19 2127

## 2019-12-10 NOTE — ED Notes (Signed)
EDP Quentin Cornwall speaking to daughter at this time.

## 2019-12-10 NOTE — ED Triage Notes (Signed)
Pt comes into the ED via EMS from home with c/o generalized weakness, wt loss, loss of appetite.  b/p 124/84, 85HR, 97.9 . Hx of parkinsons

## 2019-12-10 NOTE — ED Notes (Signed)
This RN spoke with daughter information on file; daughter requesting to speak with EDP regarding her mother D/C. This RN notified  EDP Quentin Cornwall.

## 2019-12-10 NOTE — ED Triage Notes (Signed)
Pt states she is weak and dehydrated and has been since she left the hospital on 11/29. Pt also reports general bodyaches.

## 2019-12-10 NOTE — ED Notes (Signed)
This RN provided pt with milk and graham cracker. Per pt's request. Pt denies N/V at this time.

## 2019-12-11 ENCOUNTER — Telehealth: Payer: Self-pay | Admitting: Family Medicine

## 2019-12-11 DIAGNOSIS — R110 Nausea: Secondary | ICD-10-CM | POA: Diagnosis not present

## 2019-12-11 DIAGNOSIS — F329 Major depressive disorder, single episode, unspecified: Secondary | ICD-10-CM | POA: Diagnosis present

## 2019-12-11 DIAGNOSIS — F32A Depression, unspecified: Secondary | ICD-10-CM | POA: Diagnosis present

## 2019-12-11 LAB — URINE DRUG SCREEN, QUALITATIVE (ARMC ONLY)
Amphetamines, Ur Screen: NOT DETECTED
Barbiturates, Ur Screen: NOT DETECTED
Benzodiazepine, Ur Scrn: NOT DETECTED
Cannabinoid 50 Ng, Ur ~~LOC~~: NOT DETECTED
Cocaine Metabolite,Ur ~~LOC~~: NOT DETECTED
MDMA (Ecstasy)Ur Screen: NOT DETECTED
Methadone Scn, Ur: NOT DETECTED
Opiate, Ur Screen: NOT DETECTED
Phencyclidine (PCP) Ur S: NOT DETECTED
Tricyclic, Ur Screen: NOT DETECTED

## 2019-12-11 LAB — URINALYSIS, COMPLETE (UACMP) WITH MICROSCOPIC
Bacteria, UA: NONE SEEN
Bilirubin Urine: NEGATIVE
Glucose, UA: NEGATIVE mg/dL
Ketones, ur: 5 mg/dL — AB
Nitrite: NEGATIVE
Protein, ur: NEGATIVE mg/dL
Specific Gravity, Urine: 1.023 (ref 1.005–1.030)
pH: 5 (ref 5.0–8.0)

## 2019-12-11 LAB — URINE CULTURE: Culture: <10000 — AB

## 2019-12-11 LAB — ACETAMINOPHEN LEVEL: Acetaminophen (Tylenol), Serum: <10 ug/mL — ABNORMAL LOW (ref 10–30)

## 2019-12-11 LAB — SALICYLATE LEVEL: Salicylate Lvl: <7 mg/dL (ref 2.8–30.0)

## 2019-12-11 LAB — RESPIRATORY PANEL BY RT PCR (FLU A&B, COVID)
Influenza A by PCR: NEGATIVE
Influenza B by PCR: NEGATIVE
SARS Coronavirus 2 by RT PCR: NEGATIVE

## 2019-12-11 LAB — ETHANOL: Alcohol, Ethyl (B): <10 mg/dL (ref <10)

## 2019-12-11 MED ORDER — PANTOPRAZOLE SODIUM 40 MG PO TBEC
40.0000 mg | DELAYED_RELEASE_TABLET | Freq: Every day | ORAL | Status: DC
  Administered 2019-12-11: 40 mg via ORAL
  Filled 2019-12-11: qty 1

## 2019-12-11 MED ORDER — ALUM & MAG HYDROXIDE-SIMETH 200-200-20 MG/5ML PO SUSP
30.0000 mL | Freq: Once | ORAL | Status: AC
  Administered 2019-12-11: 16:00:00 30 mL via ORAL
  Filled 2019-12-11: qty 30

## 2019-12-11 MED ORDER — PANTOPRAZOLE SODIUM 40 MG PO TBEC: 40.0000 mg | DELAYED_RELEASE_TABLET | Freq: Every day | ORAL | Status: DC

## 2019-12-11 MED ORDER — SUCRALFATE 1 G PO TABS: 1.0000 g | ORAL_TABLET | Freq: Four times a day (QID) | ORAL | 1 refills | Status: DC

## 2019-12-11 MED ORDER — FAMOTIDINE 20 MG PO TABS: 20.0000 mg | ORAL_TABLET | Freq: Two times a day (BID) | ORAL | 0 refills | Status: DC

## 2019-12-11 NOTE — ED Notes (Signed)
Pt ambulatory to toilet for urine sample  Pt given fluids to drink

## 2019-12-11 NOTE — BH Assessment (Signed)
TTS attempted to contact daughter regarding discharge, daughter did not answer. TTS communicated this to nursing staff.

## 2019-12-11 NOTE — ED Notes (Addendum)
Assumed care of patient. Patient in hall bed awake and alert, reports she is waiting to have an endoscopy done today. Patient oriented to where she is and was upset that someone put her in psych. Patient denies SI/HI/HV this morning, but continues to express her concerns about not being able to eat sand the amount of wt she has lost due to not being able to swallow. awaiting psych eval and plan of care.

## 2019-12-11 NOTE — BH Assessment (Signed)
Assessment Note  Leslie Wong is an 63 y.o. female who presents to be ER via EMS due to medical complaints of; general weakness and poor appetite. Patient's daughter was the one who contact it 63, out of concern of her mother losing weight. Per the report of the patient's daughter Leslie Wong-[phone]) patient does have a history of mental illness, which she suffers from bipolar. However, she does not believe her current presentation is due to her mental illness. Daughter states the patient is not eating because she believes someone is either poisoning her or the food is not good for her. Patient is diagnosed with Parkinson's disease and is followed by the medical staff at [facility]. Patient has missed appointments with her neurologist due to either COVID restrictions and/or patient's own unwillingness to leave the house. She currently lives alone and her sister and daughter goes to the house to assist her.  Patient is able to perform her ADL's and walk without any difficulty.   Per the report of the patient, she was brought to the ER for medical concerns. She reports of having "real bad acid reflux" and difficulty keeping food down.  Per the patient "It's like a dragon in my throat." During the interview the patient was calm, cooperative and pleasant. She was able to provide appropriate answers to the questions. She denies thoughts, gestures and intentions of hurting herself or anyone else. Patient also denies having auditory and visual hallucinations.  Patient have been seen in the past for similar presentation, it has resulted in her getting admitted to behavioral medicine. Per the report of patient's daughter, she did not want to come to the ER out of fear of being placed on the psych Ward and her physical wasn't address. However, per the EDP, after medical workup, the patient is medically and was going to be discharged until daughter voiced her concerns about the patient.  Other  symptoms listed by patient's daughter are; decrease appetite, reports of no longer wanting to live, as well as increase paranoia and unwillingness to leave the house.  Diagnosis: Bipolar (per history)  Past Medical History:  Past Medical History:  Diagnosis Date  . Back pain   . Cancer ([place])    skin  . Depression    BAD  . Gastroparesis   . GERD (gastroesophageal reflux disease)   . Hypothyroidism   . Insomnia   . Migraines   . Parkinson disease ([place])    per [place] [facility], dx'd 2019  . Recurrent cold sores   . Shoulder bursitis   . Urge incontinence     Past Surgical History:  Procedure Laterality Date  . ABDOMINAL HYSTERECTOMY  1999   total  . APPENDECTOMY    . ESOPHAGOGASTRODUODENOSCOPY  01/2006   negative except small hiatal hernia  . ESOPHAGOGASTRODUODENOSCOPY  02/24/2015   See report  . LAPAROSCOPY     for endometriosis  . OVARIAN CYST REMOVAL  1990   unilateral  . TONSILLECTOMY AND ADENOIDECTOMY      Family History:  Family History  Problem Relation Age of Onset  . Hypertension Mother   . Arthritis Mother   . Diabetes Mother   . Stroke Mother   . Cancer Father        lung  . Colon cancer Neg Hx   . Breast cancer Neg Hx     Social History:  reports that she has been smoking cigarettes. She has a 42.50 pack-year smoking history. She has quit using smokeless tobacco.  She reports that she does not drink alcohol or use drugs.  Additional Social History:  Alcohol / Drug Use Pain Medications: See PTA Prescriptions: See PTA Over the Counter: See PTA History of alcohol / drug use?: No history of alcohol / drug abuse Longest period of sobriety (when/how long): None reported  CIWA: CIWA-Ar BP: 110/75 Pulse Rate: 62 COWS:    Allergies:  Allergies  Allergen Reactions  . Valproic Acid Shortness Of Breath  . Aspirin     REACTION: UNSPECIFIED  . Atorvastatin Other (See Comments)    Tongue swelling, legs cramping  . Baclofen     REACTION: Throat swells  up  . Codeine     REACTION: UNSPECIFIED  . Duloxetine Other (See Comments)    Vision loss, weight loss  . Erythromycin     REACTION: UNSPECIFIED  . Gabapentin     REACTION: throat swells up  . Metoclopramide Hcl     REACTION: states "messed me up"  . Nortriptyline     Other reaction(s): Other (See Comments) Vision issues  . Nsaids Other (See Comments)    Stomach problems  . Nucynta Er [Tapentadol Hcl Er]     Intolerant, see note from 07/24/12.    Gean [place] [Tapentadol]     AMS  . Olanzapine Nausea Only  . Prednisone     GI intolance  . Pregabalin   . Seroquel [Quetiapine Fumerate] Other (See Comments)    Loss control, felt like on another planet  . Sulfa Antibiotics Nausea And Vomiting  . Topamax Other (See Comments)    Blisters in mouth and tongue  . Vicodin [Hydrocodone-Acetaminophen]     Nausea, thrush and constipation    Home Medications: (Not in a hospital admission)   OB/GYN Status:  No LMP recorded. Patient has had a hysterectomy.  General Assessment Data Location of Assessment: [facility] ED TTS Assessment: In system Is this a Tele or Face-to-Face Assessment?: Face-to-Face Is this an Initial Assessment or a Re-assessment for this encounter?: Initial Assessment Patient Accompanied by:: N/A Language Other than English: No Living Arrangements: Other (Comment)(Private Home) What gender do you identify as?: Female Marital status: Divorced Pregnancy Status: No Living Arrangements: Alone Can pt return to current living arrangement?: Yes Admission Status: Voluntary Is patient capable of signing voluntary admission?: Yes Referral Source: Self/Family/Friend Insurance type: Medicare A&B  Medical Screening Exam ([place]) Medical Exam completed: Yes  Crisis Care Plan Living Arrangements: Alone Legal Guardian: Other:(Self) Name of Psychiatrist: [place] Name of Therapist: [place]  Education Status Is patient currently in  school?: No Is the patient employed, unemployed or receiving disability?: Unemployed, Receiving disability income  Risk to self with the past 6 months Suicidal Ideation: No Has patient been a risk to self within the past 6 months prior to admission? : No Suicidal Intent: No Has patient had any suicidal intent within the past 6 months prior to admission? : No Is patient at risk for suicide?: No Suicidal Plan?: No Has patient had any suicidal plan within the past 6 months prior to admission? : No Access to Means: No What has been your use of drugs/alcohol within the last 12 months?: Reports of none Previous Attempts/Gestures: No How many times?: 0 Other Self Harm Risks: Reports of none Triggers for Past Attempts: None known Intentional Self Injurious Behavior: None Family Suicide History: Unknown Recent stressful life event(s): Other (Comment) Persecutory voices/beliefs?: No Depression: Yes Depression Symptoms: Insomnia, Tearfulness, Isolating, Fatigue, Loss of interest in usual  pleasures, Feeling worthless/self pity Substance abuse history and/or treatment for substance abuse?: No Suicide prevention information given to non-admitted patients: Not applicable  Risk to Others within the past 6 months Homicidal Ideation: No Does patient have any lifetime risk of violence toward others beyond the six months prior to admission? : No Thoughts of Harm to Others: No Current Homicidal Intent: No Current Homicidal Plan: No Access to Homicidal Means: No Identified Victim: Reports of none History of harm to others?: No Assessment of Violence: None Noted Violent Behavior Description: Reports of none Does patient have access to weapons?: No Criminal Charges Pending?: No Does patient have a court date: No Is patient on probation?: No  Psychosis Hallucinations: None noted Delusions: None noted  Mental Status Report Appearance/Hygiene: Unremarkable, In scrubs Eye Contact: Good Motor  Activity: Freedom of movement Speech: Logical/coherent, Soft, Unremarkable Level of Consciousness: Alert Mood: Sad, Pleasant Affect: Appropriate to circumstance, Depressed, Sad Anxiety Level: Minimal Thought Processes: Coherent, Relevant Judgement: Unimpaired Orientation: Person, Place, Time, Situation, Appropriate for developmental age Obsessive Compulsive Thoughts/Behaviors: Minimal  Cognitive Functioning Concentration: Normal Memory: Recent Intact, Remote Intact Is patient IDD: No Insight: Fair Impulse Control: Fair Appetite: Poor Have you had any weight changes? : Loss Amount of the weight change? (lbs): (Amount is unknown) Sleep: Decreased(Trouble falling and staying asleep) Total Hours of Sleep: (Unknown) Vegetative Symptoms: None  ADLScreening [facility] Assessment Services) Patient's cognitive ability adequate to safely complete daily activities?: Yes Patient able to express need for assistance with ADLs?: Yes Independently performs ADLs?: Yes (appropriate for developmental age)  Prior Inpatient Therapy Prior Inpatient Therapy: Yes Prior Therapy Dates: 08/2019, 05/2019, 03/2019, 09/2018 & 08/2018 Prior Therapy Facilty/Provider(s): [facility] Reason for Treatment: Bipolar-Psychosis  Prior Outpatient Therapy Prior Outpatient Therapy: Yes Prior Therapy Dates: Current Prior Therapy Facilty/Provider(s): [place] Reason for Treatment: Bipolar Does patient have an ACCT team?: No Does patient have Intensive In-House Services?  : No Does patient have [facility] services? : No Does patient have P4CC services?: No  ADL Screening (condition at time of admission) Patient's cognitive ability adequate to safely complete daily activities?: Yes Is the patient deaf or have difficulty hearing?: No Does the patient have difficulty seeing, even when wearing glasses/contacts?: No Does the patient have difficulty concentrating, remembering, or making decisions?: No Patient  able to express need for assistance with ADLs?: Yes Does the patient have difficulty dressing or bathing?: No Independently performs ADLs?: Yes (appropriate for developmental age) Does the patient have difficulty walking or climbing stairs?: No Weakness of Legs: None Weakness of Arms/Hands: None  Home Assistive Devices/Equipment Home Assistive Devices/Equipment: None  Therapy Consults (therapy consults require a physician order) PT Evaluation Needed: No OT Evalulation Needed: No SLP Evaluation Needed: No Abuse/Neglect Assessment (Assessment to be complete while patient is alone) Abuse/Neglect Assessment Can Be Completed: Yes Physical Abuse: Denies Verbal Abuse: Denies Sexual Abuse: Denies Exploitation of patient/patient's resources: Denies Self-Neglect: Denies Values / Beliefs Cultural Requests During Hospitalization: None Spiritual Requests During Hospitalization: None Consults Spiritual Care Consult Needed: No Transition of Care Team Consult Needed: No         Child/Adolescent Assessment Running Away Risk: Denies(Patient is an adult)  Disposition:  Disposition Initial Assessment Completed for this Encounter: Yes  On Site Evaluation by:   Reviewed with Physician:    Gunnar Fusi MS, LCAS, [facility], [place] Therapeutic Triage Specialist 12/11/2019 12:46 AM

## 2019-12-11 NOTE — ED Notes (Signed)
Pt reports not taking bipolar meds for the last 3 weeks due to "stomach acid"; pt reports last psych visit (CBC in [place]) last September  Pt reported this this RN that the Olanzapine that Dr Meredith Mody gave her was the only thing she could take

## 2019-12-11 NOTE — ED Notes (Signed)
Pt was dressed out by this RN. Belongings placed in pt belonging bag. Personal items include:   1 light blue t-shirt 1 pair of tan pants 1 pair of green socks Beige underwear  Light green coat 1 pair of green and blue sneakers

## 2019-12-11 NOTE — ED Notes (Signed)
DC pt's IV. Gave her some apple juice and a warm blanket.

## 2019-12-11 NOTE — Telephone Encounter (Signed)
Please check with patient/daughter.  I saw the emergency room notes.  Per EMR, she needs follow-up with GI.  Let me know if she needs a referral.  If she needs a visit scheduled here I will be glad to see her.  She would need a 30-minute appointment.  Thanks.    I pasted the note from the emergency room below.  "Patient is seen by psychiatry who advises patient is psychiatrically stable and does not require further psychiatric care at this time.  Only symptoms are burning throat pain which appear to be due to GERD.  This can be contributing to poor oral intake and weight loss and many of her other symptoms, and I will have her take Carafate and Pepcid as well.  Recommend follow-up with gastroenterology."

## 2019-12-11 NOTE — ED Notes (Signed)
Patient c/o of acid reflux and throat pain. Dr. Joni Fears aware, po malox given as ordered.

## 2019-12-11 NOTE — ED Notes (Addendum)
TTS called and asked to speak with daughter whom was very upset and was under the impression that her mother was being admitted to psych due to loosing weight and being depressed. It was explaned to her that she was cleared by psych to go home. As per Emergency medicine patient was also cleared. Patient is being discharged to home with prescription for her gerd and  follow up instructions for GI to have her acid reflux address. As per TTS attempted to call daughter with no success.

## 2019-12-11 NOTE — ED Notes (Signed)
Patient changed her mind about taking a shower,

## 2019-12-11 NOTE — ED Notes (Signed)
Red top sent to lab with pt label. This RN unable to print labels from [place].

## 2019-12-11 NOTE — Consult Note (Signed)
[facility] Consult   Reason for Consult: Generalized body aches, weakness, and dehydration Referring Physician: Dr. Quentin Cornwall Patient Identification: Leslie Wong MRN:  [id number] Principal Diagnosis: <principal problem not specified> Diagnosis:  Active Problems:   Hypothyroidism   Hypercalcemia   Bipolar affective disorder, current episode hypomanic ([place])   Gastroesophageal reflux disease with hiatal hernia   Urethral stricture   FIBROCYSTIC BREAST DISEASE   INCONTINENCE, URGE   Chronic back pain   Shoulder pain   Medicare annual wellness visit, subsequent   Osteopenia   Advance care planning   Parkinson disease ([place])   Night sweats   Pupil asymmetry   Cracking skin   Opiate dependence ([place])   DDD (degenerative disc disease), cervical   Cervical radiculopathy   Bipolar 1 disorder, mixed, severe (HCC)   Cannabis abuse   Paranoia ([place])   Depression   Total Time spent with patient: 1 hour  Subjective: "I am here because my stomach hurt and I am unable to keep any food down." Leslie Wong is a 63 y.o. female patient presented to [facility] ED via EMS from home with patient daughter at her side and she is voluntary. Per the ED triage nursing note, the patient was brought in due to complaints of generalized weakness, weight loss, and loss of appetite.  The patient voiced she came into the ED due to her having issues with her stomach.  She states, "I cannot keep anything in my stomach." She says, "my stomach hurts real bad when I eat food." The patient was seen face-to-face by this provider; chart reviewed and consulted with Dr. Quentin Cornwall on 12/10/2019 due to the patient's care. It was discussed with the EDP that the patient would remain under observation overnight and reassess in the a.m. to determine if she meets criteria for psychiatric inpatient admission or be discharged back home. The patient is alert and oriented x 3, calm, cooperative, and  mood-congruent with affect on evaluation. The patient does not appear to be responding to internal or external stimuli. Neither is the patient presenting with any delusional thinking. The patient denies auditory or visual hallucinations. The patient denies any suicidal, homicidal, or self-harm ideations. The patient is not presenting with any psychotic or paranoid behaviors. During an encounter with the patient, she was able to answer questions appropriately. Collateral was obtained by the patient's daughter [name] [phone]), who expresses concerns for the patient's physical health.  [name] voiced, "I think there is something physically wrong with my mother, and no one is trying to find out what it is." She discussed that she does not think it is anything psychiatrically during this visit.  She voiced, she is aware that her mom suffers from severe depression and bipolar.  She states that her mom is not presenting with any manic behaviors, and her depression is not as bad as in the past.  Ms. [name] did admit that her mom is coming around to the second-anniversary death of the patient's father.  She states that her mom has lost about 65 pounds since the summer.  She voiced her mom does not want to eat.  She indicated her mom did not want to come to the ER this day (12.15.20).  She states, "my mom begged me not to bring her to the ER because when she gets to the hospital, all they are going to do is put her in the psych ward and gave her psychiatric medications, which is not why she is there."  The patient daughter voiced her frustration with the healthcare system and the care that her mom is getting.  She states that her mom has been unable to see her outpatient provider due to [place], and her mom desire not wanting to live anymore.  She says that she believes that her mom has given up due to her doctors unable to find out what is going on with her physically. Plan: The patient will remain under  observation overnight and reassess in the a.m. to determine if she meets criteria for psychiatric inpatient admission or she can be discharged back home. HPI: Per Dr. Florentina Addison; Leslie Wong is a 63 y.o. female bolus past medical history presents the ER for evaluation of nausea.  Patient feels like she cannot keep anything down.  Denies any abdominal pain.  States she feels dehydrated.  Patient very anxious appearing.  States the symptoms been of been ongoing for several months.  States that she takes omeprazole.  Feels like the acid comes back up into her throat.  Denies any other complaints.  Past Psychiatric History:  Depression Parkinson's disease ([place]) Insomnia  Risk to Self:  No Risk to Others:  No Prior Inpatient Therapy:  Yes Prior Outpatient Therapy:  Yes  Past Medical History:  Past Medical History:  Diagnosis Date  . Back pain   . Cancer ([place])    skin  . Depression    BAD  . Gastroparesis   . GERD (gastroesophageal reflux disease)   . Hypothyroidism   . Insomnia   . Migraines   . Parkinson disease ([place])    per [facility], dx'd 2019  . Recurrent cold sores   . Shoulder bursitis   . Urge incontinence     Past Surgical History:  Procedure Laterality Date  . ABDOMINAL HYSTERECTOMY  1999   total  . APPENDECTOMY    . ESOPHAGOGASTRODUODENOSCOPY  01/2006   negative except small hiatal hernia  . ESOPHAGOGASTRODUODENOSCOPY  02/24/2015   See report  . LAPAROSCOPY     for endometriosis  . OVARIAN CYST REMOVAL  1990   unilateral  . TONSILLECTOMY AND ADENOIDECTOMY     Family History:  Family History  Problem Relation Age of Onset  . Hypertension Mother   . Arthritis Mother   . Diabetes Mother   . Stroke Mother   . Cancer Father        lung  . Colon cancer Neg Hx   . Breast cancer Neg Hx    Family Psychiatric  History:  Social History:  Social History   Substance and Sexual Activity  Alcohol Use No  . Alcohol/week: 0.0 standard drinks      Social History   Substance and Sexual Activity  Drug Use No    Social History   Socioeconomic History  . Marital status: Divorced    Spouse name: Not on file  . Number of children: Not on file  . Years of education: Not on file  . Highest education level: Not on file  Occupational History  . Not on file  Tobacco Use  . Smoking status: Current Some Day Smoker    Packs/day: 1.00    Years: 42.50    Pack years: 42.50    Types: Cigarettes  . Smokeless tobacco: Former Network engineer and Sexual Activity  . Alcohol use: No    Alcohol/week: 0.0 standard drinks  . Drug use: No  . Sexual activity: Never  Other Topics Concern  . Not on file  Social History Narrative   Lives alone.     Has a dog names Dixie   Social Determinants of Health   Financial Resource Strain:   . Difficulty of Paying Living Expenses: Not on file  Food Insecurity:   . Worried About Charity fundraiser in the Last Year: Not on file  . Ran Out of Food in the Last Year: Not on file  Transportation Needs:   . Lack of Transportation (Medical): Not on file  . Lack of Transportation (Non-Medical): Not on file  Physical Activity:   . Days of Exercise per Week: Not on file  . Minutes of Exercise per Session: Not on file  Stress:   . Feeling of Stress : Not on file  Social Connections:   . Frequency of Communication with Friends and Family: Not on file  . Frequency of Social Gatherings with Friends and Family: Not on file  . Attends Religious Services: Not on file  . Active Member of Clubs or Organizations: Not on file  . Attends Archivist Meetings: Not on file  . Marital Status: Not on file   Additional Social History:    Allergies:   Allergies  Allergen Reactions  . Valproic Acid Shortness Of Breath  . Aspirin     REACTION: UNSPECIFIED  . Atorvastatin Other (See Comments)    Tongue swelling, legs cramping  . Baclofen     REACTION: Throat swells up  . Codeine     REACTION:  UNSPECIFIED  . Duloxetine Other (See Comments)    Vision loss, weight loss  . Erythromycin     REACTION: UNSPECIFIED  . Gabapentin     REACTION: throat swells up  . Metoclopramide Hcl     REACTION: states "messed me up"  . Nortriptyline     Other reaction(s): Other (See Comments) Vision issues  . Nsaids Other (See Comments)    Stomach problems  . Nucynta Er [Tapentadol Hcl Er]     Intolerant, see note from 07/24/12.    Gean [place]chwood [Tapentadol]     AMS  . Olanzapine Nausea Only  . Prednisone     GI intolance  . Pregabalin   . Seroquel [Quetiapine Fumerate] Other (See Comments)    Loss control, felt like on another planet  . Sulfa Antibiotics Nausea And Vomiting  . Topamax Other (See Comments)    Blisters in mouth and tongue  . Vicodin [Hydrocodone-Acetaminophen]     Nausea, thrush and constipation    Labs:  Results for orders placed or performed during the hospital encounter of 12/10/19 (from the past 48 hour(s))  Basic metabolic panel     Status: Abnormal   Collection Time: 12/10/19  2:48 PM  Result Value Ref Range   Sodium 138 135 - 145 mmol/L   Potassium 3.5 3.5 - 5.1 mmol/L   Chloride 98 98 - 111 mmol/L   CO2 27 22 - 32 mmol/L   Glucose, Bld 104 (H) 70 - 99 mg/dL   BUN 23 8 - 23 mg/dL   Creatinine, Ser 1.01 (H) 0.44 - 1.00 mg/dL   Calcium 11.0 (H) 8.9 - 10.3 mg/dL   GFR calc non Af Amer >60 >60 mL/min   GFR calc Af Amer >60 >60 mL/min   Anion gap 13 5 - 15    Comment: Performed at [facility], [address], [place], [place] [address]  CBC     Status: Abnormal   Collection Time: 12/10/19  2:48 PM  Result  Value Ref Range   WBC 9.7 4.0 - 10.5 K/uL   RBC 5.04 3.87 - 5.11 MIL/uL   Hemoglobin 16.6 (H) 12.0 - 15.0 g/dL   HCT 45.3 36.0 - 46.0 %   MCV 89.9 80.0 - 100.0 fL   MCH 32.9 26.0 - 34.0 pg   MCHC 36.6 (H) 30.0 - 36.0 g/dL   RDW 13.2 11.5 - 15.5 %   Platelets 212 150 - 400 K/uL   nRBC 0.0 0.0 - 0.2 %    Comment: Performed at [facility], [address] [place], [place] [address]  Hepatic function panel     Status: None   Collection Time: 12/10/19  9:03 PM  Result Value Ref Range   Total Protein 6.7 6.5 - 8.1 g/dL   Albumin 4.2 3.5 - 5.0 g/dL   AST 17 15 - 41 U/L   ALT 12 0 - 44 U/L   Alkaline Phosphatase 65 38 - 126 U/L   Total Bilirubin 0.9 0.3 - 1.2 mg/dL   Bilirubin, Direct 0.2 0.0 - 0.2 mg/dL   Indirect Bilirubin 0.7 0.3 - 0.9 mg/dL    Comment: Performed at [facility], [address] [place], [place] [address]  Lipase, blood     Status: None   Collection Time: 12/10/19  9:03 PM  Result Value Ref Range   Lipase 32 11 - 51 U/L    Comment: Performed at [facility], [address], [place], [place] [address]    Current Facility-Administered Medications  Medication Dose Route Frequency Provider Last Rate Last Admin  . NON FORMULARY 5 mL  5 mL Oral QID PRN Tonia Ghent, MD       Current Outpatient Medications  Medication Sig Dispense Refill  . albuterol (VENTOLIN HFA) 108 (90 Base) MCG/ACT inhaler Inhale 2 puffs into the lungs every 6 (six) hours as needed for wheezing or shortness of breath. 18 g 11  . ascorbic acid (VITAMIN C) 500 MG tablet Take 1 tablet (500 mg total) by mouth daily. (Patient not taking: Reported on 08/27/2019) 90 tablet 3  . cholecalciferol (VITAMIN D) 25 MCG (1000 UT) tablet Take 1 tablet (1,000 Units total) by mouth daily. (Patient not taking: Reported on 08/27/2019) 90 tablet 3  . Diphenhyd-Hydrocort-Nystatin (FIRST-DUKES MOUTHWASH) SUSP Use as directed 5 mLs in the mouth or throat 4 (four) times daily as needed. 120 mL 0  . doxepin (SINEQUAN) 25 MG capsule Take 50 mg by mouth at bedtime.    . fluocinonide cream (LIDEX) [id number] % Apply 1 application topically 2 (two) times daily as needed. 30 g 0  . hydrocerin (EUCERIN) CREA Apply 1 application topically 2 (two) times daily. 454 g 0  . hydrOXYzine (ATARAX/VISTARIL) 50 MG tablet Take 1 tablet (50 mg total) by mouth  every 6 (six) hours as needed for anxiety. 12 tablet 0  . lamoTRIgine (LAMICTAL) 25 MG tablet Take 2 tablets (50 mg total) by mouth daily. 60 tablet 1  . levothyroxine (SYNTHROID) 88 MCG tablet TAKE 1 TABLET(88 MCG) BY MOUTH DAILY 90 tablet 1  . OLANZapine (ZYPREXA) 15 MG tablet Take 15 mg by mouth daily.    . Omega-3 Fatty Acids (FISH OIL) 1000 MG CAPS Take 1 capsule (1,000 mg total) by mouth daily. (Patient not taking: Reported on 08/27/2019) 90 capsule 3  . omeprazole (PRILOSEC) 20 MG capsule Take 1 capsule (20 mg total) by mouth daily. 90 capsule 3  . ondansetron (ZOFRAN-ODT) 4 MG disintegrating tablet Take 1  tablet (4 mg total) by mouth 3 (three) times daily as needed for nausea or vomiting. 30 tablet 1  . propranolol (INDERAL) 10 MG tablet Take 1 tablet (10 mg total) by mouth 2 (two) times daily. 180 tablet 3  . SENOKOT 8.6 MG tablet Take 2 tablets by mouth daily.    Marland Kitchen tiZANidine (ZANAFLEX) 4 MG tablet Take 1 tablet (4 mg total) by mouth every 8 (eight) hours as needed for muscle spasms. 90 tablet 1  . valACYclovir (VALTREX) 500 MG tablet TAKE 1 TABLET(500 MG) BY MOUTH TWICE DAILY 60 tablet 5  . vitamin B-12 (CYANOCOBALAMIN) 1000 MCG tablet Take 1 tablet (1,000 mcg total) by mouth daily. (Patient not taking: Reported on 08/27/2019) 90 tablet 3  . Vitamin D, Ergocalciferol, (DRISDOL) 1.25 MG (50000 UT) CAPS capsule Take 1 capsule by mouth once a week.      Musculoskeletal: Strength & Muscle Tone: within normal limits Gait & Station: unsteady Patient leans: N/A  Psychiatric Specialty Exam: Physical Exam  Nursing note and vitals reviewed. Constitutional: She is oriented to person, place, and time. She appears well-developed.  Cardiovascular: Normal rate.  Respiratory: Effort normal.  Musculoskeletal:     Cervical back: Normal range of motion and neck supple.  Neurological: She is alert and oriented to person, place, and time.  Psychiatric: She has a normal mood and affect. Her behavior is  normal.    Review of Systems  Neurological: Positive for weakness and light-headedness.  Psychiatric/Behavioral: Positive for sleep disturbance.  All other systems reviewed and are negative.   Blood pressure 110/75, pulse 62, temperature 98.2 F (36.8 C), temperature source Oral, resp. rate 18, height 4\' 11"  (1.499 m), weight 47.6 kg, SpO2 100 %.Body mass index is 21.21 kg/m.  General Appearance: Casual  Eye Contact:  Good  Speech:  Clear and Coherent  Volume:  Decreased  Mood:  Depressed  Affect:  Congruent  Thought Process:  Coherent  Orientation:  Full (Time, Place, and Person)  Thought Content:  Logical  Suicidal Thoughts:  No  Homicidal Thoughts:  No  Memory:  Immediate;   Good  Judgement:  Poor  Insight:  Lacking  Psychomotor Activity:  Decreased  Concentration:  Concentration: Fair  Recall:  Poor  Fund of Knowledge:  Fair  Language:  Fair  Akathisia:  No  Handed:  Right  AIMS (if indicated):     Assets:  Desire for Improvement Physical Health Social Support  ADL's:  Intact  Cognition:  WNL  Sleep:   Insomnia     Treatment Plan Summary: Daily contact with patient to assess and evaluate symptoms and progress in treatment, Medication management and Plan Patient will remain on the observation overnight and will be reassessed in the a.m. to determine if she meets criteria for psychiatric inpatient admission.  Disposition: Supportive therapy provided about ongoing stressors. Patient will remain on the observation overnight and reassess in the a.m. to determine if she meets criteria for psychiatric inpatient admission.  Caroline Sauger, NP 12/11/2019 12:36 AM

## 2019-12-11 NOTE — ED Notes (Signed)
Hourly rounding reveals patient in room. Stable, in no acute distress. Q15 minute rounds and monitoring via Security Cameras to continue. 

## 2019-12-11 NOTE — ED Notes (Signed)
Hourly rounding reveals patient awake in hall. No complaints, stable, in no acute distress. Q15 minute rounds and monitoring via Engineer, drilling to continue.

## 2019-12-11 NOTE — ED Notes (Signed)
Hourly rounding reveals patient in hall bed. No complaints, stable, in no acute distress. Q15 minute rounds and monitoring via Rover and Officer to continue.  

## 2019-12-11 NOTE — ED Notes (Signed)
Hourly rounding reveals patient awake in hall bed. Stable, in no acute distress. Q15 minute rounds and monitoring via Engineer, drilling to continue.

## 2019-12-11 NOTE — ED Notes (Signed)
Pt. C/o acid reflux.

## 2019-12-11 NOTE — Consult Note (Signed)
  Patient was evaluated by psychiatry services last night but was reevaluated by this writer due to unusual presentation.  Patient maintains that she is not suicidal homicidal or psychotic at this time.  She does acknowledge a history of psychiatric issues include anxiety and depression and she does state that her anxiety may be contributing to her picture but denies that it is causing her burning throat pain.  Patient states that she is not here for psychiatric reasons rather she is here for her medical issues with her throat.  She denies any delusions at this time but does seem upset when discussing that she has lost a a self-reported 10 pounds in the past 2 weeks due to this throat pain.  At this time patient will not require further psychiatric services and will follow up on an outpatient basis with psych services.

## 2019-12-11 NOTE — ED Notes (Signed)
evluated by psych plan of care pending.

## 2019-12-11 NOTE — ED Notes (Signed)
Patient showered.

## 2019-12-11 NOTE — ED Provider Notes (Signed)
Procedures  Clinical Course as of Dec 10 1648  Tue Dec 10, 2019  1924 Patient tolerating p.o. patient states that the symptoms have been ongoing for several weeks.  Discussed my recommendation for CT imaging to further evaluate.  Patient declining this.  States that she does feel very anxious.  Wonder if this is contributing to her nausea.  She received IV fluids.  Remains hemodynamically stable.  Will give Ativan.   [PR]  1957 Patient feels improved requesting discharge home.   [PR]  2028 Patient's daughter called stating that she would not pick the patient up stating that for the past several weeks the patient has been lying on her couch not eating stating that the food is "devoured wearing her.  "Has severe anhedonia stating that she wishes she were dead.  Reportedly has been not compliant with her medications.  Does not feel safe with her being home.  Given that history I do feel that psychiatric consultation indicated she very likely will require hospitalization.   [PR]    Clinical Course User Index [PR] Merlyn Lot, MD    ----------------------------------------- 4:49 PM on 12/11/2019 ----------------------------------------- Patient is seen by psychiatry who advises patient is psychiatrically stable and does not require further psychiatric care at this time.  Only symptoms are burning throat pain which appear to be due to GERD.  This can be contributing to poor oral intake and weight loss and many of her other symptoms, and I will have her take Carafate and Pepcid as well.  Recommend follow-up with gastroenterology.    Carrie Mew, MD 12/11/19 [phone]

## 2019-12-11 NOTE — ED Notes (Addendum)
Pt. Transferred to room 20 hall after screening for contraband. Report to include Situation, Background, Assessment and Recommendations from [facility]. Pt. Oriented to unit including Q15 minute rounds as well as Garment/textile technologist for their protection. Patient is alert and oriented, warm and dry in no acute distress. Patient denies SI, HI, and AVH. Pt. Encouraged to let me know if needs arise.

## 2019-12-11 NOTE — ED Notes (Signed)
Patient sleeping comfortably in hall bed.

## 2019-12-12 LAB — LAMOTRIGINE LEVEL: Lamotrigine Lvl: <1 ug/mL — ABNORMAL LOW (ref 2.0–20.0)

## 2019-12-12 NOTE — Telephone Encounter (Signed)
Daughter called for the GI appointment and has that already scheduled.

## 2019-12-13 NOTE — Telephone Encounter (Signed)
Noted.  I will await the GI notes.  Thanks.

## 2019-12-15 ENCOUNTER — Emergency Department
Admission: EM | Admit: 2019-12-15 | Discharge: 2019-12-15 | Disposition: A | Discharge status: Home / Self Care | Payer: Medicare Other | Attending: Emergency Medicine | Admitting: Emergency Medicine

## 2019-12-15 ENCOUNTER — Emergency Department: Payer: Medicare Other

## 2019-12-15 ENCOUNTER — Encounter: Payer: Self-pay | Admitting: Emergency Medicine

## 2019-12-15 ENCOUNTER — Other Ambulatory Visit: Payer: Self-pay

## 2019-12-15 ENCOUNTER — Telehealth: Payer: Self-pay | Admitting: Family Medicine

## 2019-12-15 DIAGNOSIS — Z79899 Other long term (current) drug therapy: Secondary | ICD-10-CM | POA: Insufficient documentation

## 2019-12-15 DIAGNOSIS — K219 Gastro-esophageal reflux disease without esophagitis: Secondary | ICD-10-CM | POA: Diagnosis not present

## 2019-12-15 DIAGNOSIS — G20 Parkinson's disease: Secondary | ICD-10-CM | POA: Insufficient documentation

## 2019-12-15 DIAGNOSIS — E039 Hypothyroidism, unspecified: Secondary | ICD-10-CM | POA: Insufficient documentation

## 2019-12-15 DIAGNOSIS — F17210 Nicotine dependence, cigarettes, uncomplicated: Secondary | ICD-10-CM | POA: Insufficient documentation

## 2019-12-15 LAB — TROPONIN I (HIGH SENSITIVITY): Troponin I (High Sensitivity): 3 ng/L (ref <18)

## 2019-12-15 LAB — CBC
HCT: 42.5 % (ref 36.0–46.0)
Hemoglobin: 15 g/dL (ref 12.0–15.0)
MCH: 32.6 pg (ref 26.0–34.0)
MCHC: 35.3 g/dL (ref 30.0–36.0)
MCV: 92.4 fL (ref 80.0–100.0)
Platelets: 222 10*3/uL (ref 150–400)
RBC: 4.6 MIL/uL (ref 3.87–5.11)
RDW: 13.7 % (ref 11.5–15.5)
WBC: 7.9 10*3/uL (ref 4.0–10.5)
nRBC: 0 % (ref 0.0–0.2)

## 2019-12-15 LAB — BASIC METABOLIC PANEL
Anion gap: 8 (ref 5–15)
BUN: 7 mg/dL — ABNORMAL LOW (ref 8–23)
CO2: 28 mmol/L (ref 22–32)
Calcium: 10.6 mg/dL — ABNORMAL HIGH (ref 8.9–10.3)
Chloride: 105 mmol/L (ref 98–111)
Creatinine, Ser: 0.74 mg/dL (ref 0.44–1.00)
GFR calc Af Amer: >60 mL/min (ref >60)
GFR calc non Af Amer: >60 mL/min (ref >60)
Glucose, Bld: 118 mg/dL — ABNORMAL HIGH (ref 70–99)
Potassium: 4.9 mmol/L (ref 3.5–5.1)
Sodium: 141 mmol/L (ref 135–145)

## 2019-12-15 NOTE — ED Notes (Signed)
Pt able to reach her brother to get a ride home

## 2019-12-15 NOTE — ED Triage Notes (Signed)
Pt in via EMS from home. EMS reports pt c/o GERD and nausea. EMS reports pt with chromic GERD issues.

## 2019-12-15 NOTE — ED Notes (Addendum)
Pt states that she was seen by GI Friday and has an endoscopy scheduled for the 20th. Pt mentioned that the GI dr said if she didn't get better she may need to be admitted for mental health. Pt reports that she is dehydrated, unable to eat or drink well due to feeling nauseated constantly with frequent belching. Pt states that she was prescribed a pill in the ER but states that it isn't helping. Pt is sitting on the edge of her bed and appears nervous. Pt is rocking herself, pt reports hx of mental health issues. No acute distress noted at this time

## 2019-12-15 NOTE — ED Provider Notes (Signed)
[facility] Emergency Department Provider Note   ____________________________________________    I have reviewed the triage vital signs and the nursing notes.   HISTORY  Chief Complaint Heartburn     HPI Leslie Wong is a 63 y.o. female who reports that she is having difficulty with her medications that she takes for GERD.  She notes that they seem to cause her discomfort.  She has seen GI recently and has endoscopy scheduled in January.  She does take omeprazole and Carafate.  She takes the omeprazole in the morning as well as the Carafate.  Currently she feels well and has no complaints.  No nausea or vomiting.  Has significant anxiety around this issue but no SI or HI.  Review of records demonstrates she has been seen here before and evaluated by psychiatry  Past Medical History:  Diagnosis Date  . Back pain   . Cancer ([place])    skin  . Depression    BAD  . Gastroparesis   . GERD (gastroesophageal reflux disease)   . Hypothyroidism   . Insomnia   . Migraines   . Parkinson disease ([place])    per [facility], dx'd 2019  . Recurrent cold sores   . Shoulder bursitis   . Urge incontinence     Patient Active Problem List   Diagnosis Date Noted  . Depression 12/11/2019  . Paranoia ([place])   . Cannabis abuse 05/28/2019  . Bipolar 1 disorder, mixed, severe ([place]) 05/27/2019  . Opiate dependence ([place]) 04/25/2019  . Cracking skin 01/27/2019  . Delusion ([place]) 09/26/2018  . Night sweats 05/13/2018  . Pupil asymmetry 05/13/2018  . DDD (degenerative disc disease), cervical 04/23/2018  . Parkinson disease ([place])   . HLD (hyperlipidemia) 03/10/2016  . Advance care planning 03/10/2016  . Cervical radiculopathy 07/30/2015  . Osteopenia 04/18/2014  . Medicare annual wellness visit, subsequent 03/18/2014  . Shoulder pain 01/17/2013  . Chronic back pain 09/14/2011  . Hypercalcemia 03/25/2010  . INCONTINENCE, URGE 04/16/2009  . CARPAL TUNNEL  SYNDROME, BILATERAL 09/24/2008  . 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 Hypothyroidism 06/05/2007  . Bipolar affective disorder, current episode hypomanic ([place]) 06/05/2007  . MIGRAINE HEADACHE 06/05/2007  . Gastroesophageal reflux disease with hiatal hernia 06/05/2007  . IRRITABLE BOWEL SYNDROME 06/05/2007  . FIBROCYSTIC BREAST DISEASE 06/05/2007    Past Surgical History:  Procedure Laterality Date  . ABDOMINAL HYSTERECTOMY  1999   total  . APPENDECTOMY    . ESOPHAGOGASTRODUODENOSCOPY  01/2006   negative except small hiatal hernia  . ESOPHAGOGASTRODUODENOSCOPY  02/24/2015   See report  . LAPAROSCOPY     for endometriosis  . OVARIAN CYST REMOVAL  1990   unilateral  . TONSILLECTOMY AND ADENOIDECTOMY      Prior to Admission medications   Medication Sig Start Date End Date Taking? Authorizing Provider  albuterol (VENTOLIN HFA) 108 (90 Base) MCG/ACT inhaler Inhale 2 puffs into the lungs every 6 (six) hours as needed for wheezing or shortness of breath. 07/25/19   Tonia Ghent, MD  ascorbic acid (VITAMIN C) 500 MG tablet Take 1 tablet (500 mg total) by mouth daily. Patient not taking: Reported on 08/27/2019 07/25/19   Tonia Ghent, MD  cholecalciferol (VITAMIN D) 25 MCG (1000 UT) tablet Take 1 tablet (1,000 Units total) by mouth daily. Patient not taking: Reported on 08/27/2019 07/25/19   Tonia Ghent, MD  Diphenhyd-Hydrocort-Nystatin (FIRST-DUKES MOUTHWASH) SUSP Use as directed 5 mLs in the mouth or throat 4 (  four) times daily as needed. 08/23/19   Tonia Ghent, MD  doxepin (SINEQUAN) 25 MG capsule Take 50 mg by mouth at bedtime. 08/21/19   [provider]  famotidine (PEPCID) 20 MG tablet Take 1 tablet (20 mg total) by mouth 2 (two) times daily. 12/11/19   Carrie Mew, MD  fluocinonide cream (LIDEX) [id number] % Apply 1 application topically 2 (two) times daily as needed. 08/16/19   Tonia Ghent, MD  hydrocerin (EUCERIN) CREA Apply 1 application topically 2 (two) times  daily. 04/29/19   Patrecia Pour, NP  hydrOXYzine (ATARAX/VISTARIL) 50 MG tablet Take 1 tablet (50 mg total) by mouth every 6 (six) hours as needed for anxiety. 12/10/19   Merlyn Lot, MD  lamoTRIgine (LAMICTAL) 25 MG tablet Take 2 tablets (50 mg total) by mouth daily. 05/30/19   [name], [name], MD  levothyroxine (SYNTHROID) 88 MCG tablet TAKE 1 TABLET(88 MCG) BY MOUTH DAILY 10/08/19   Tonia Ghent, MD  OLANZapine (ZYPREXA) 15 MG tablet Take 15 mg by mouth daily. 05/31/19   [provider]  Omega-3 Fatty Acids (FISH OIL) 1000 MG CAPS Take 1 capsule (1,000 mg total) by mouth daily. Patient not taking: Reported on 08/27/2019 07/25/19   Tonia Ghent, MD  omeprazole (PRILOSEC) 20 MG capsule Take 1 capsule (20 mg total) by mouth daily. 12/10/19   Merlyn Lot, MD  ondansetron (ZOFRAN-ODT) 4 MG disintegrating tablet Take 1 tablet (4 mg total) by mouth 3 (three) times daily as needed for nausea or vomiting. 07/26/19   Tonia Ghent, MD  propranolol (INDERAL) 10 MG tablet Take 1 tablet (10 mg total) by mouth 2 (two) times daily. 07/25/19   Tonia Ghent, MD  SENOKOT 8.6 MG tablet Take 2 tablets by mouth daily. 07/14/19   [provider]  sucralfate (CARAFATE) 1 g tablet Take 1 tablet (1 g total) by mouth 4 (four) times daily. 12/11/19   Carrie Mew, MD  tiZANidine (ZANAFLEX) 4 MG tablet Take 1 tablet (4 mg total) by mouth every 8 (eight) hours as needed for muscle spasms. 07/11/19   Tonia Ghent, MD  valACYclovir (VALTREX) 500 MG tablet TAKE 1 TABLET(500 MG) BY MOUTH TWICE DAILY 07/23/19   Tonia Ghent, MD  vitamin B-12 (CYANOCOBALAMIN) 1000 MCG tablet Take 1 tablet (1,000 mcg total) by mouth daily. Patient not taking: Reported on 08/27/2019 07/25/19   Tonia Ghent, MD  Vitamin D, Ergocalciferol, (DRISDOL) 1.25 MG (50000 UT) CAPS capsule Take 1 capsule by mouth once a week.    [provider]     Allergies Doxepin, Valproic acid, Aspirin,  Atorvastatin, Baclofen, Codeine, Duloxetine, Erythromycin, Gabapentin, Metoclopramide hcl, Nortriptyline, Nsaids, Nucynta er [tapentadol hcl er], Nucynta [tapentadol], Olanzapine, Prednisone, Pregabalin, Seroquel [quetiapine fumerate], Sulfa antibiotics, Topamax, and Vicodin [hydrocodone-acetaminophen]  Family History  Problem Relation Age of Onset  . Hypertension Mother   . Arthritis Mother   . Diabetes Mother   . Stroke Mother   . Cancer Father        lung  . Colon cancer Neg Hx   . Breast cancer Neg Hx     Social History Social History   Tobacco Use  . Smoking status: Current Some Day Smoker    Packs/day: 1.00    Years: 42.50    Pack years: 42.50    Types: Cigarettes  . Smokeless tobacco: Former Network engineer Use Topics  . Alcohol use: No    Alcohol/week: 0.0 standard drinks  . Drug  use: No    Review of Systems  Constitutional: No fever/chills Eyes: No visual changes.  ENT: No sore throat. Cardiovascular: Denies chest pain. Respiratory: Denies shortness of breath. Gastrointestinal: As above Genitourinary: Negative for dysuria. Musculoskeletal: Negative for back pain. Skin: Negative for rash. Neurological: Negative for headaches   ____________________________________________   PHYSICAL EXAM:  VITAL SIGNS: ED Triage Vitals  Enc Vitals Group     BP 12/15/19 1258 (!) 94/53     Pulse Rate 12/15/19 1258 70     Resp 12/15/19 1258 16     Temp 12/15/19 1258 98.2 F (36.8 C)     Temp Source 12/15/19 1258 Oral     SpO2 12/15/19 1258 98 %     Weight 12/15/19 1020 45.4 kg (100 lb)     Height 12/15/19 1020 1.499 m (4\' 11" )     Head Circumference --      Peak Flow --      Pain Score 12/15/19 1020 10     Pain Loc --      Pain Edu? --      Excl. in [place]? --     Constitutional: Alert and oriented. e  Nose: No congestion/rhinnorhea. Mouth/Throat: Mucous membranes are moist.    Cardiovascular: Normal rate, regular rhythm.   Good peripheral  circulation. Respiratory: Normal respiratory effort.  No retractions. Gastrointestinal: Soft and nontender. No distention.   Genitourinary: deferred Musculoskeletal: No lower extremity tenderness nor edema.  Warm and well perfused Neurologic:  Normal speech and language. No gross focal neurologic deficits are appreciated.  Skin:  Skin is warm, dry and intact. No rash noted. Psychiatric: Mood and affect are normal. Speech and behavior are normal.  ____________________________________________   LABS (all labs ordered are listed, but only abnormal results are displayed)  Labs Reviewed  BASIC METABOLIC PANEL - Abnormal; Notable for the following components:      Result Value   Glucose, Bld 118 (*)    BUN 7 (*)    Calcium 10.6 (*)    All other components within normal limits  CBC  TROPONIN I (HIGH SENSITIVITY)  TROPONIN I (HIGH SENSITIVITY)   ____________________________________________  EKG  ED ECG REPORT I, Lavonia Drafts, the attending physician, personally viewed and interpreted this ECG.  Date: 12/15/2019  Rhythm: normal sinus rhythm QRS Axis: normal Intervals: normal ST/T Wave abnormalities: normal Narrative Interpretation: no evidence of acute ischemia  ____________________________________________  RADIOLOGY  None ____________________________________________   PROCEDURES  Procedure(s) performed: No  Procedures   Critical Care performed: No ____________________________________________   INITIAL IMPRESSION / ASSESSMENT AND PLAN / ED COURSE  Pertinent labs & imaging results that were available during my care of the patient were reviewed by me and considered in my medical decision making (see chart for details).  Patient is asymptomatic at this time, has anxiety regarding her medications.  We discussed this at some length.  Recommend taking omeprazole at night and taking her Carafate with foods as opposed to prior to foods.  However she is well-appearing  and no indication for admission at this time.  While she does have significant anxiety she appears capable of caring for herself and reports that her sister lives near her.  At this time we will discharge her with follow-up with GI    ____________________________________________   FINAL CLINICAL IMPRESSION(S) / ED DIAGNOSES  Final diagnoses:  Gastroesophageal reflux disease, unspecified whether esophagitis present        Note:  This document was prepared using Dragon voice recognition  software and may include unintentional dictation errors.   Lavonia Drafts, MD 12/15/19 1538

## 2019-12-15 NOTE — Telephone Encounter (Signed)
Please check with patient's daughter.  I got a phone call from the GI clinic on 12/13/19.  I appreciate GI input. My question is if there is anything else we can do for the patient at this point that would improve her situation.  Please see if you can get an update from her daughter about the patient's current living arrangements.  I do not know if the patient was still looking for placement/needing placement or if she would even consent to that.  Please let me know any details you can get.  Thanks.

## 2019-12-15 NOTE — ED Triage Notes (Signed)
Pt requesting to go to the mental heath unit.

## 2019-12-15 NOTE — ED Notes (Signed)
Pt currently in xray

## 2019-12-15 NOTE — ED Notes (Signed)
Pt back from x-ray.

## 2019-12-15 NOTE — ED Triage Notes (Signed)
Pt reports still having GERD issues and is supposed to have an endoscopy. Pt states 'they told my daughter that if I was still having issues with it then I would have to come in on their mental health floor".

## 2019-12-16 NOTE — Telephone Encounter (Signed)
Left detailed message on voicemail for daughter to return the call with information.

## 2019-12-17 NOTE — Telephone Encounter (Signed)
Please let Leslie Wong know that I am checking on options in the meantime.  I routed this back to myself for reference.

## 2019-12-17 NOTE — Telephone Encounter (Signed)
[name] advised.

## 2019-12-17 NOTE — Telephone Encounter (Signed)
[name] (daughter) says patient is still living in her own apartment.  However daughter does feel that she needs to be placed either in assisted living or some type of facility to try to help her.  Daughter says she thinks she is severely depressed, she has lost weight, she has "aged" in the last few months, she states she doesn't want to be here anymore but is not suicidal.  Patient is not psychotic now but just needs help with the depression.  She has been to the hospital ER 3 times in the last 3 weeks and they say they cannot find anything wrong.  Patient did refuse a CT at the hospital and daughter doesn't know why.  Daughter is sick right now herself and cannot take her mother anywhere.

## 2019-12-18 NOTE — Telephone Encounter (Signed)
I have checked with Gerre Pebbles in the meantime.  She is checking to see what resources will be available.  I will await report back from her.  I thank all involved.

## 2019-12-20 ENCOUNTER — Other Ambulatory Visit: Payer: Self-pay

## 2019-12-20 ENCOUNTER — Encounter: Payer: Self-pay | Admitting: Emergency Medicine

## 2019-12-20 ENCOUNTER — Emergency Department
Admission: EM | Admit: 2019-12-20 | Discharge: 2019-12-20 | Disposition: A | Discharge status: Home / Self Care | Payer: Medicare Other | Attending: Emergency Medicine | Admitting: Emergency Medicine

## 2019-12-20 DIAGNOSIS — G20 Parkinson's disease: Secondary | ICD-10-CM | POA: Diagnosis not present

## 2019-12-20 DIAGNOSIS — E039 Hypothyroidism, unspecified: Secondary | ICD-10-CM | POA: Insufficient documentation

## 2019-12-20 DIAGNOSIS — F32A Depression, unspecified: Secondary | ICD-10-CM

## 2019-12-20 DIAGNOSIS — R112 Nausea with vomiting, unspecified: Secondary | ICD-10-CM | POA: Insufficient documentation

## 2019-12-20 DIAGNOSIS — F17210 Nicotine dependence, cigarettes, uncomplicated: Secondary | ICD-10-CM | POA: Insufficient documentation

## 2019-12-20 DIAGNOSIS — R531 Weakness: Secondary | ICD-10-CM | POA: Diagnosis present

## 2019-12-20 DIAGNOSIS — Z79899 Other long term (current) drug therapy: Secondary | ICD-10-CM | POA: Insufficient documentation

## 2019-12-20 DIAGNOSIS — F329 Major depressive disorder, single episode, unspecified: Secondary | ICD-10-CM | POA: Diagnosis not present

## 2019-12-20 DIAGNOSIS — F319 Bipolar disorder, unspecified: Secondary | ICD-10-CM

## 2019-12-20 LAB — COMPREHENSIVE METABOLIC PANEL
ALT: 14 U/L (ref 0–44)
AST: 19 U/L (ref 15–41)
Albumin: 4.3 g/dL (ref 3.5–5.0)
Alkaline Phosphatase: 71 U/L (ref 38–126)
Anion gap: 9 (ref 5–15)
BUN: 5 mg/dL — ABNORMAL LOW (ref 8–23)
CO2: 26 mmol/L (ref 22–32)
Calcium: 10.4 mg/dL — ABNORMAL HIGH (ref 8.9–10.3)
Chloride: 103 mmol/L (ref 98–111)
Creatinine, Ser: 0.71 mg/dL (ref 0.44–1.00)
GFR calc Af Amer: >60 mL/min (ref >60)
GFR calc non Af Amer: >60 mL/min (ref >60)
Glucose, Bld: 108 mg/dL — ABNORMAL HIGH (ref 70–99)
Potassium: 4.1 mmol/L (ref 3.5–5.1)
Sodium: 138 mmol/L (ref 135–145)
Total Bilirubin: 0.6 mg/dL (ref 0.3–1.2)
Total Protein: 7.2 g/dL (ref 6.5–8.1)

## 2019-12-20 LAB — CBC
HCT: 44.5 % (ref 36.0–46.0)
Hemoglobin: 15.7 g/dL — ABNORMAL HIGH (ref 12.0–15.0)
MCH: 32.6 pg (ref 26.0–34.0)
MCHC: 35.3 g/dL (ref 30.0–36.0)
MCV: 92.5 fL (ref 80.0–100.0)
Platelets: 275 10*3/uL (ref 150–400)
RBC: 4.81 MIL/uL (ref 3.87–5.11)
RDW: 13.6 % (ref 11.5–15.5)
WBC: 11 10*3/uL — ABNORMAL HIGH (ref 4.0–10.5)
nRBC: 0 % (ref 0.0–0.2)

## 2019-12-20 LAB — ACETAMINOPHEN LEVEL: Acetaminophen (Tylenol), Serum: <10 ug/mL — ABNORMAL LOW (ref 10–30)

## 2019-12-20 LAB — SALICYLATE LEVEL: Salicylate Lvl: <7 mg/dL — ABNORMAL LOW (ref 7.0–30.0)

## 2019-12-20 LAB — ETHANOL: Alcohol, Ethyl (B): <10 mg/dL (ref <10)

## 2019-12-20 MED ORDER — FAMOTIDINE IN NACL 20-0.9 MG/50ML-% IV SOLN
20.0000 mg | Freq: Once | INTRAVENOUS | Status: AC
  Administered 2019-12-20: 20 mg via INTRAVENOUS
  Filled 2019-12-20: qty 50

## 2019-12-20 MED ORDER — SODIUM CHLORIDE 0.9 % IV BOLUS
1000.0000 mL | Freq: Once | INTRAVENOUS | Status: AC
  Administered 2019-12-20: 16:00:00 1000 mL via INTRAVENOUS

## 2019-12-20 NOTE — Consult Note (Signed)
[facility] Consult   Reason for Consult:  Psych evaluation Referring Physician:  Dr. Cherylann Banas Patient Identification: Leslie Wong MRN:  [id number] Principal Diagnosis: <principal problem not specified> Diagnosis:  Active Problems:   * No active hospital problems. *   Total Time spent with patient: 20 minutes  Subjective:   Leslie Wong is a 63 y.o. female patient with a past psychiatric history of bipolar disorder, substance-use disorder, who presents to ER today with complaints of heartburn. Psychiatry consulted for evaluation.  HPI:  Patient reports she had several episodes of vomiting at home and now she is concerned about acidic feeling in her mouth. She is afraid she might be dehydrated and have an exacerbation of acid reflux, so she came to the ER to get some fluids and medication for acid reflux. She reports that her PCP is aware of her GI issues and that "gastric study" have scheduled on outpatient basis. Patient reports feeling depressed and anxious in settings of current medical issues. She denies feeling suicidal or homicidal. She denies any hallucinations. She denies recent substance use. She reports she has been compliant with psychotropic medications (states she is on Lamotrigine) and outpatient follow-ups with psychiatrist at [facility]. She reports her sister is supportive and lives around the corner from her. She is willing to get fluids and medications for "gastric acid" and states she can contract for safety if discharged home.   Past Psychiatric History:   Dx: bipolar disorder, substance use disorder (cannabis, benzodiazepines, pain pills). Multiple psych hospitalizations; last - 08/2019. Outpatient MH: [facility]. Past psych medications: Depakote, Cymbalta, Topamax, Seroquel, Zyprexa, Lamotrigine Current medication: reports Lamotrigine only.  Social History: patient is divorced, has two children. Not  working. No current legal issues.  Substance use: nicotine, cannabis (currently denies).    Risk to Self:  no Risk to Others:  no Prior Inpatient Therapy:  yes Prior Outpatient Therapy:  yes  Past Medical History:  Past Medical History:  Diagnosis Date  . Back pain   . Cancer ([place])    skin  . Depression    BAD  . Gastroparesis   . GERD (gastroesophageal reflux disease)   . Hypothyroidism   . Insomnia   . Migraines   . Parkinson disease ([place])    per [facility], dx'd 2019  . Recurrent cold sores   . Shoulder bursitis   . Urge incontinence     Past Surgical History:  Procedure Laterality Date  . ABDOMINAL HYSTERECTOMY  1999   total  . APPENDECTOMY    . ESOPHAGOGASTRODUODENOSCOPY  01/2006   negative except small hiatal hernia  . ESOPHAGOGASTRODUODENOSCOPY  02/24/2015   See report  . LAPAROSCOPY     for endometriosis  . OVARIAN CYST REMOVAL  1990   unilateral  . TONSILLECTOMY AND ADENOIDECTOMY     Family History:  Family History  Problem Relation Age of Onset  . Hypertension Mother   . Arthritis Mother   . Diabetes Mother   . Stroke Mother   . Cancer Father        lung  . Colon cancer Neg Hx   . Breast cancer Neg Hx    Family Psychiatric  History: n/a Social History:  Social History   Substance and Sexual Activity  Alcohol Use No  . Alcohol/week: 0.0 standard drinks     Social History   Substance and Sexual Activity  Drug Use No    Social History   Socioeconomic History  .  Marital status: Divorced    Spouse name: Not on file  . Number of children: Not on file  . Years of education: Not on file  . Highest education level: Not on file  Occupational History  . Not on file  Tobacco Use  . Smoking status: Current Some Day Smoker    Packs/day: 1.00    Years: 42.50    Pack years: 42.50    Types: Cigarettes  . Smokeless tobacco: Former Network engineer and Sexual Activity  . Alcohol use: No    Alcohol/week: 0.0 standard drinks  . Drug use:  No  . Sexual activity: Never  Other Topics Concern  . Not on file  Social History Narrative   Lives alone.     Has a dog names Dixie   Social Determinants of Health   Financial Resource Strain:   . Difficulty of Paying Living Expenses: Not on file  Food Insecurity:   . Worried About Charity fundraiser in the Last Year: Not on file  . Ran Out of Food in the Last Year: Not on file  Transportation Needs:   . Lack of Transportation (Medical): Not on file  . Lack of Transportation (Non-Medical): Not on file  Physical Activity:   . Days of Exercise per Week: Not on file  . Minutes of Exercise per Session: Not on file  Stress:   . Feeling of Stress : Not on file  Social Connections:   . Frequency of Communication with Friends and Family: Not on file  . Frequency of Social Gatherings with Friends and Family: Not on file  . Attends Religious Services: Not on file  . Active Member of Clubs or Organizations: Not on file  . Attends Archivist Meetings: Not on file  . Marital Status: Not on file   Additional Social History:    Allergies:   Allergies  Allergen Reactions  . Doxepin Other (See Comments)    Pt reports "that made me have seizures"  . Valproic Acid Shortness Of Breath  . Aspirin     REACTION: UNSPECIFIED  . Atorvastatin Other (See Comments)    Tongue swelling, legs cramping  . Baclofen     REACTION: Throat swells up  . Codeine     REACTION: UNSPECIFIED  . Duloxetine Other (See Comments)    Vision loss, weight loss  . Erythromycin     REACTION: UNSPECIFIED  . Gabapentin     REACTION: throat swells up  . Metoclopramide Hcl     REACTION: states "messed me up"  . Nortriptyline     Other reaction(s): Other (See Comments) Vision issues  . Nsaids Other (See Comments)    Stomach problems  . Nucynta Er [Tapentadol Hcl Er]     Intolerant, see note from 07/24/12.    Gean Birchwood [Tapentadol]     AMS  . Olanzapine Nausea Only  . Prednisone     GI intolance   . Pregabalin   . Seroquel [Quetiapine Fumerate] Other (See Comments)    Loss control, felt like on another planet  . Sulfa Antibiotics Nausea And Vomiting  . Topamax Other (See Comments)    Blisters in mouth and tongue  . Vicodin [Hydrocodone-Acetaminophen]     Nausea, thrush and constipation    Labs:  Results for orders placed or performed during the hospital encounter of 12/20/19 (from the past 48 hour(s))  Comprehensive metabolic panel     Status: Abnormal   Collection Time: 12/20/19  2:35 PM  Result Value Ref Range   Sodium 138 135 - 145 mmol/L   Potassium 4.1 3.5 - 5.1 mmol/L   Chloride 103 98 - 111 mmol/L   CO2 26 22 - 32 mmol/L   Glucose, Bld 108 (H) 70 - 99 mg/dL   BUN 5 (L) 8 - 23 mg/dL   Creatinine, Ser 0.71 0.44 - 1.00 mg/dL   Calcium 10.4 (H) 8.9 - 10.3 mg/dL   Total Protein 7.2 6.5 - 8.1 g/dL   Albumin 4.3 3.5 - 5.0 g/dL   AST 19 15 - 41 U/L   ALT 14 0 - 44 U/L   Alkaline Phosphatase 71 38 - 126 U/L   Total Bilirubin 0.6 0.3 - 1.2 mg/dL   GFR calc non Af Amer >60 >60 mL/min   GFR calc Af Amer >60 >60 mL/min   Anion gap 9 5 - 15    Comment: Performed at [facility], [address] [place], [place] [address]  Ethanol     Status: None   Collection Time: 12/20/19  2:35 PM  Result Value Ref Range   Alcohol, Ethyl (B) <10 <10 mg/dL    Comment: (NOTE) Lowest detectable limit for serum alcohol is 10 mg/dL. For medical purposes only. Performed at [facility], [address] [place], [place] [id number]   Salicylate level     Status: Abnormal   Collection Time: 12/20/19  2:35 PM  Result Value Ref Range   Salicylate Lvl [id number] (L) 7.0 - 30.0 mg/dL    Comment: Performed at [facility], [address] [place], [place] [address]  Acetaminophen level     Status: Abnormal   Collection Time: 12/20/19  2:35 PM  Result Value Ref Range   Acetaminophen (Tylenol), Serum <10 (L) 10 - 30 ug/mL    Comment: (NOTE) Therapeutic concentrations  vary significantly. A range of 10-30 ug/mL  may be an effective concentration for many patients. However, some  are best treated at concentrations outside of this range. Acetaminophen concentrations >150 ug/mL at 4 hours after ingestion  and >50 ug/mL at 12 hours after ingestion are often associated with  toxic reactions. Performed at [facility], [address] [place], [place] [address]   cbc     Status: Abnormal   Collection Time: 12/20/19  2:35 PM  Result Value Ref Range   WBC 11.0 (H) 4.0 - 10.5 K/uL   RBC 4.81 3.87 - 5.11 MIL/uL   Hemoglobin 15.7 (H) 12.0 - 15.0 g/dL   HCT 44.5 36.0 - 46.0 %   MCV 92.5 80.0 - 100.0 fL   MCH 32.6 26.0 - 34.0 pg   MCHC 35.3 30.0 - 36.0 g/dL   RDW 13.6 11.5 - 15.5 %   Platelets 275 150 - 400 K/uL   nRBC 0.0 0.0 - 0.2 %    Comment: Performed at [facility], [address], [place], [place] [address]    Current Facility-Administered Medications  Medication Dose Route Frequency Provider Last Rate Last Admin  . famotidine (PEPCID) IVPB 20 mg premix  20 mg Intravenous Once Arta Silence, MD 100 mL/hr at 12/20/19 1545 20 mg at 12/20/19 1545  . NON FORMULARY 5 mL  5 mL Oral QID PRN Tonia Ghent, MD       Current Outpatient Medications  Medication Sig Dispense Refill  . albuterol (VENTOLIN HFA) 108 (90 Base) MCG/ACT inhaler Inhale 2 puffs into the lungs every 6 (six) hours as needed for wheezing or shortness of  breath. 18 g 11  . ascorbic acid (VITAMIN C) 500 MG tablet Take 1 tablet (500 mg total) by mouth daily. (Patient not taking: Reported on 08/27/2019) 90 tablet 3  . cholecalciferol (VITAMIN D) 25 MCG (1000 UT) tablet Take 1 tablet (1,000 Units total) by mouth daily. (Patient not taking: Reported on 08/27/2019) 90 tablet 3  . Diphenhyd-Hydrocort-Nystatin (FIRST-DUKES MOUTHWASH) SUSP Use as directed 5 mLs in the mouth or throat 4 (four) times daily as needed. 120 mL 0  . doxepin (SINEQUAN) 25 MG capsule Take 50 mg by  mouth at bedtime.    . famotidine (PEPCID) 20 MG tablet Take 1 tablet (20 mg total) by mouth 2 (two) times daily. 60 tablet 0  . fluocinonide cream (LIDEX) [id number] % Apply 1 application topically 2 (two) times daily as needed. 30 g 0  . hydrocerin (EUCERIN) CREA Apply 1 application topically 2 (two) times daily. 454 g 0  . hydrOXYzine (ATARAX/VISTARIL) 50 MG tablet Take 1 tablet (50 mg total) by mouth every 6 (six) hours as needed for anxiety. 12 tablet 0  . lamoTRIgine (LAMICTAL) 25 MG tablet Take 2 tablets (50 mg total) by mouth daily. 60 tablet 1  . levothyroxine (SYNTHROID) 88 MCG tablet TAKE 1 TABLET(88 MCG) BY MOUTH DAILY 90 tablet 1  . OLANZapine (ZYPREXA) 15 MG tablet Take 15 mg by mouth daily.    . Omega-3 Fatty Acids (FISH OIL) 1000 MG CAPS Take 1 capsule (1,000 mg total) by mouth daily. (Patient not taking: Reported on 08/27/2019) 90 capsule 3  . omeprazole (PRILOSEC) 20 MG capsule Take 1 capsule (20 mg total) by mouth daily. 90 capsule 3  . ondansetron (ZOFRAN-ODT) 4 MG disintegrating tablet Take 1 tablet (4 mg total) by mouth 3 (three) times daily as needed for nausea or vomiting. 30 tablet 1  . propranolol (INDERAL) 10 MG tablet Take 1 tablet (10 mg total) by mouth 2 (two) times daily. 180 tablet 3  . SENOKOT 8.6 MG tablet Take 2 tablets by mouth daily.    . sucralfate (CARAFATE) 1 g tablet Take 1 tablet (1 g total) by mouth 4 (four) times daily. 120 tablet 1  . tiZANidine (ZANAFLEX) 4 MG tablet Take 1 tablet (4 mg total) by mouth every 8 (eight) hours as needed for muscle spasms. 90 tablet 1  . valACYclovir (VALTREX) 500 MG tablet TAKE 1 TABLET(500 MG) BY MOUTH TWICE DAILY 60 tablet 5  . vitamin B-12 (CYANOCOBALAMIN) 1000 MCG tablet Take 1 tablet (1,000 mcg total) by mouth daily. (Patient not taking: Reported on 08/27/2019) 90 tablet 3  . Vitamin D, Ergocalciferol, (DRISDOL) 1.25 MG (50000 UT) CAPS capsule Take 1 capsule by mouth once a week.       Psychiatric Specialty Exam: Physical  Exam  Lymphadenopathy:    She has cervical adenopathy.    Review of Systems  Blood pressure 128/84, pulse 86, temperature 98.6 F (37 C), temperature source Oral, resp. rate 16, weight 45.4 kg, SpO2 98 %.Body mass index is 20.22 kg/m.  General Appearance: Casual  Eye Contact:  Good  Speech:  Normal Rate  Volume:  Decreased  Mood:  Anxious  Affect:  Constricted  Thought Process:  Coherent, Goal Directed and Linear  Orientation:  Full (Time, Place, and Person)  Thought Content:  Logical  Suicidal Thoughts:  No  Homicidal Thoughts:  No  Memory:  Immediate;   Fair Recent;   Fair  Judgement:  Fair  Insight:  Fair  Psychomotor Activity:  Decreased  Concentration:  Concentration: Fair and Attention Span: Fair  Recall:  [employer] of Knowledge:  Fair  Language:  Good  Akathisia:  No  Handed:  Right  AIMS (if indicated):     Assets:  Communication Skills Desire for Improvement Housing Social Support  ADL's:  Intact  Cognition:  WNL  Sleep:       Disposition: No evidence of imminent risk to self or others at present.   Patient does not meet criteria for psychiatric inpatient admission. Supportive therapy provided about ongoing stressors. Discussed crisis plan, support from social network, calling 911, coming to the Emergency Department, and calling Suicide Hotline.   Assessment: 63 y.o. female patient with a past psychiatric history of bipolar disorder, substance-use disorder, who presents to ER today with complaints of heartburn. Psychiatry consulted for evaluation due to patient had multiple previous psych admissions. During my assessment, pt is calm, cooperative, and logical. Patient denies suicidal, homicidal thoughts,  hallucinations and paranoia; patient does not appear to be psychotic, gravely disabled or at acute risk of harming self or others. Patient does not meet criteria for an inpatient psychiatric admission at this time. Patient enrdorses that she has "bipolar  depression" and that she takes her medications regularly and follows with [facility].   IMPRESSION: Bipolar disorder. Substance use disorder.   RECOMMENDATIONS:  -No indication for inpatient psychiatric hospitalization.  -continue outpatient psych medications.  -Follow-up with outpatient mental health provider.  -Emergency Contact Plan: Discussed support from social network. Patient understands to call Suicide Hotline at 70, or Mobile Crisis at [phone], call 911, or go to the nearest ER as appropriate in case of thoughts of harm to self or others, or acute onset of intolerable side effects.  [name], MD 12/20/2019 4:09 PM

## 2019-12-20 NOTE — ED Notes (Signed)
First Nurse note  Presents via [place] [employer]  States she wants to see beh med

## 2019-12-20 NOTE — ED Notes (Signed)
Pt given saltine crackers, MD Siadecki aware.

## 2019-12-20 NOTE — ED Notes (Signed)
E-signature not working at this time. Pt verbalized understanding of D/C instructions, prescriptions and follow up care with no further questions at this time. Pt in NAD and ambulatory at time of D/C.  

## 2019-12-20 NOTE — ED Triage Notes (Signed)
Patient states she has had a couple episodes of emesis since 11/30 and now "the stomach acids are eating me up".  Patient is concerned she is dehydrated.

## 2019-12-20 NOTE — ED Provider Notes (Signed)
[facility] Emergency Department Provider Note ____________________________________________   First MD Initiated Contact with Patient 12/20/19 1512     (approximate)  I have reviewed the triage vital signs and the nursing notes.   HISTORY  Chief Complaint Mental Health Problem    HPI Leslie Wong is a 63 y.o. female with PMH as noted below who presents for 2 primary complaints.  The first complaint is persistent nausea and vomiting over the last month, associated with decreased p.o. intake and some generalized weakness.  The patient reports she has upper abdominal pain after vomiting, and denies diarrhea.  She has no fever.  The patient states that she takes medications for GERD as well as Zofran for nausea.  She has an endoscopy scheduled in January.  In addition, the patient reports worsening depression over the last several weeks.  She states that she has been unable to take her medications regularly because of the GI symptoms.  She denies SI or HI and is not hearing any voices.  She states that she wants to voluntarily commit herself to inpatient psychiatry.  Past Medical History:  Diagnosis Date  . Back pain   . Cancer ([place])    skin  . Depression    BAD  . Gastroparesis   . GERD (gastroesophageal reflux disease)   . Hypothyroidism   . Insomnia   . Migraines   . Parkinson disease ([place])    per [facility], dx'd 2019  . Recurrent cold sores   . Shoulder bursitis   . Urge incontinence     Patient Active Problem List   Diagnosis Date Noted  . Depression 12/11/2019  . Paranoia ([place])   . Cannabis abuse 05/28/2019  . Bipolar 1 disorder, mixed, severe ([place]) 05/27/2019  . Opiate dependence ([place]) 04/25/2019  . Cracking skin 01/27/2019  . Delusion ([place]) 09/26/2018  . Night sweats 05/13/2018  . Pupil asymmetry 05/13/2018  . DDD (degenerative disc disease), cervical 04/23/2018  . Parkinson disease ([place])   . HLD (hyperlipidemia)  03/10/2016  . Advance care planning 03/10/2016  . Cervical radiculopathy 07/30/2015  . Osteopenia 04/18/2014  . Medicare annual wellness visit, subsequent 03/18/2014  . Shoulder pain 01/17/2013  . Chronic back pain 09/14/2011  . Hypercalcemia 03/25/2010  . INCONTINENCE, URGE 04/16/2009  . CARPAL TUNNEL SYNDROME, BILATERAL 09/24/2008  . 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 Hypothyroidism 06/05/2007  . Bipolar affective disorder, current episode hypomanic ([place]) 06/05/2007  . MIGRAINE HEADACHE 06/05/2007  . Gastroesophageal reflux disease with hiatal hernia 06/05/2007  . IRRITABLE BOWEL SYNDROME 06/05/2007  . FIBROCYSTIC BREAST DISEASE 06/05/2007    Past Surgical History:  Procedure Laterality Date  . ABDOMINAL HYSTERECTOMY  1999   total  . APPENDECTOMY    . ESOPHAGOGASTRODUODENOSCOPY  01/2006   negative except small hiatal hernia  . ESOPHAGOGASTRODUODENOSCOPY  02/24/2015   See report  . LAPAROSCOPY     for endometriosis  . OVARIAN CYST REMOVAL  1990   unilateral  . TONSILLECTOMY AND ADENOIDECTOMY      Prior to Admission medications   Medication Sig Start Date End Date Taking? Authorizing Provider  albuterol (VENTOLIN HFA) 108 (90 Base) MCG/ACT inhaler Inhale 2 puffs into the lungs every 6 (six) hours as needed for wheezing or shortness of breath. 07/25/19   Tonia Ghent, MD  ascorbic acid (VITAMIN C) 500 MG tablet Take 1 tablet (500 mg total) by mouth daily. Patient not taking: Reported on 08/27/2019 07/25/19   Tonia Ghent, MD  cholecalciferol (  VITAMIN D) 25 MCG (1000 UT) tablet Take 1 tablet (1,000 Units total) by mouth daily. Patient not taking: Reported on 08/27/2019 07/25/19   Tonia Ghent, MD  Diphenhyd-Hydrocort-Nystatin (FIRST-DUKES MOUTHWASH) SUSP Use as directed 5 mLs in the mouth or throat 4 (four) times daily as needed. 08/23/19   Tonia Ghent, MD  doxepin (SINEQUAN) 25 MG capsule Take 50 mg by mouth at bedtime. 08/21/19   [provider]  famotidine  (PEPCID) 20 MG tablet Take 1 tablet (20 mg total) by mouth 2 (two) times daily. 12/11/19   Carrie Mew, MD  fluocinonide cream (LIDEX) [id number] % Apply 1 application topically 2 (two) times daily as needed. 08/16/19   Tonia Ghent, MD  hydrocerin (EUCERIN) CREA Apply 1 application topically 2 (two) times daily. 04/29/19   Patrecia Pour, NP  hydrOXYzine (ATARAX/VISTARIL) 50 MG tablet Take 1 tablet (50 mg total) by mouth every 6 (six) hours as needed for anxiety. 12/10/19   Merlyn Lot, MD  lamoTRIgine (LAMICTAL) 25 MG tablet Take 2 tablets (50 mg total) by mouth daily. 05/30/19   [name], [name], MD  levothyroxine (SYNTHROID) 88 MCG tablet TAKE 1 TABLET(88 MCG) BY MOUTH DAILY 10/08/19   Tonia Ghent, MD  OLANZapine (ZYPREXA) 15 MG tablet Take 15 mg by mouth daily. 05/31/19   [provider]  Omega-3 Fatty Acids (FISH OIL) 1000 MG CAPS Take 1 capsule (1,000 mg total) by mouth daily. Patient not taking: Reported on 08/27/2019 07/25/19   Tonia Ghent, MD  omeprazole (PRILOSEC) 20 MG capsule Take 1 capsule (20 mg total) by mouth daily. 12/10/19   Merlyn Lot, MD  ondansetron (ZOFRAN-ODT) 4 MG disintegrating tablet Take 1 tablet (4 mg total) by mouth 3 (three) times daily as needed for nausea or vomiting. 07/26/19   Tonia Ghent, MD  propranolol (INDERAL) 10 MG tablet Take 1 tablet (10 mg total) by mouth 2 (two) times daily. 07/25/19   Tonia Ghent, MD  SENOKOT 8.6 MG tablet Take 2 tablets by mouth daily. 07/14/19   [provider]  sucralfate (CARAFATE) 1 g tablet Take 1 tablet (1 g total) by mouth 4 (four) times daily. 12/11/19   Carrie Mew, MD  tiZANidine (ZANAFLEX) 4 MG tablet Take 1 tablet (4 mg total) by mouth every 8 (eight) hours as needed for muscle spasms. 07/11/19   Tonia Ghent, MD  valACYclovir (VALTREX) 500 MG tablet TAKE 1 TABLET(500 MG) BY MOUTH TWICE DAILY 07/23/19   Tonia Ghent, MD  vitamin B-12 (CYANOCOBALAMIN) 1000 MCG tablet Take 1  tablet (1,000 mcg total) by mouth daily. Patient not taking: Reported on 08/27/2019 07/25/19   Tonia Ghent, MD  Vitamin D, Ergocalciferol, (DRISDOL) 1.25 MG (50000 UT) CAPS capsule Take 1 capsule by mouth once a week.    [provider]    Allergies Doxepin, Valproic acid, Aspirin, Atorvastatin, Baclofen, Codeine, Duloxetine, Erythromycin, Gabapentin, Metoclopramide hcl, Nortriptyline, Nsaids, Nucynta er [tapentadol hcl er], Nucynta [tapentadol], Olanzapine, Prednisone, Pregabalin, Seroquel [quetiapine fumerate], Sulfa antibiotics, Topamax, and Vicodin [hydrocodone-acetaminophen]  Family History  Problem Relation Age of Onset  . Hypertension Mother   . Arthritis Mother   . Diabetes Mother   . Stroke Mother   . Cancer Father        lung  . Colon cancer Neg Hx   . Breast cancer Neg Hx     Social History Social History   Tobacco Use  . Smoking status: Current Some Day Smoker  Packs/day: 1.00    Years: 42.50    Pack years: 42.50    Types: Cigarettes  . Smokeless tobacco: Former Network engineer Use Topics  . Alcohol use: No    Alcohol/week: 0.0 standard drinks  . Drug use: No    Review of Systems  Constitutional: No fever. Eyes: No redness. ENT: No sore throat. Cardiovascular: Denies chest pain. Respiratory: Denies shortness of breath. Gastrointestinal: Positive for nausea and vomiting. Genitourinary: Negative for dysuria.  Musculoskeletal: Negative for back pain. Skin: Negative for rash. Neurological: Negative for headache.   ____________________________________________   PHYSICAL EXAM:  VITAL SIGNS: ED Triage Vitals  Enc Vitals Group     BP 12/20/19 1429 128/84     Pulse Rate 12/20/19 1429 86     Resp 12/20/19 1429 16     Temp 12/20/19 1429 98.6 F (37 C)     Temp Source 12/20/19 1429 Oral     SpO2 12/20/19 1429 98 %     Weight 12/20/19 1426 100 lb 1.4 oz (45.4 kg)     Height --      Head Circumference --      Peak Flow --      Pain Score  12/20/19 1426 0     Pain Loc --      Pain Edu? --      Excl. in [place]? --     Constitutional: Alert and oriented.  Frail appearing but in no acute distress. Eyes: Conjunctivae are normal.  No scleral icterus. Head: Atraumatic. Nose: No congestion/rhinnorhea. Mouth/Throat: Mucous membranes are slightly dry.   Neck: Normal range of motion.  Cardiovascular: Normal rate, regular rhythm.  Good peripheral circulation. Respiratory: Normal respiratory effort.  No retractions.  Gastrointestinal: Soft with mild epigastric discomfort.  No distention.  Genitourinary: No flank tenderness. Musculoskeletal: Extremities warm and well perfused.  Neurologic:  Normal speech and language. No gross focal neurologic deficits are appreciated.  Skin:  Skin is warm and dry. No rash noted. Psychiatric: Flat affect.  Depressed appearing.  ____________________________________________   LABS (all labs ordered are listed, but only abnormal results are displayed)  Labs Reviewed  COMPREHENSIVE METABOLIC PANEL - Abnormal; Notable for the following components:      Result Value   Glucose, Bld 108 (*)    BUN 5 (*)    Calcium 10.4 (*)    All other components within normal limits  SALICYLATE LEVEL - Abnormal; Notable for the following components:   Salicylate Lvl [id number] (*)    All other components within normal limits  ACETAMINOPHEN LEVEL - Abnormal; Notable for the following components:   Acetaminophen (Tylenol), Serum <10 (*)    All other components within normal limits  CBC - Abnormal; Notable for the following components:   WBC 11.0 (*)    Hemoglobin 15.7 (*)    All other components within normal limits  ETHANOL  URINE DRUG SCREEN, QUALITATIVE ([facility] ONLY)   ____________________________________________  EKG   ____________________________________________  RADIOLOGY    ____________________________________________   PROCEDURES  Procedure(s) performed: No  Procedures  Critical Care performed:  No ____________________________________________   INITIAL IMPRESSION / ASSESSMENT AND PLAN / ED COURSE  Pertinent labs & imaging results that were available during my care of the patient were reviewed by me and considered in my medical decision making (see chart for details).  63 year old female with PMH as noted above presents with persistent nausea with intermittent vomiting and GERD symptoms over the last month.  She states that she takes Zofran  which helps a little bit, but she has not really been able to eat or drink much.  In addition, she reports worsening depression but no SI or HI.  I reviewed the past medical records in epic.  The patient was most recently seen in the ED 5 days ago for worsening GERD symptoms and anxiety and was discharged home.  She was previously seen on 12/15 for nausea and anxiety, and family reported that the patient had stated she wished she were dead.  Patient was subsequently evaluated and cleared by psychiatry.  On exam today, the patient appears depressed and somewhat frail but is in no acute distress.  Her vital signs are normal.  The abdomen is soft with mild epigastric discomfort but no focal tenderness.  She does appear mildly dehydrated.  Overall presentation is consistent with ongoing symptoms related to GERD or gastritis.  There is no evidence of bowel obstruction, colitis, diverticulitis, or acute hepatobiliary cause.  Based on the exam, at this time there is no indication for emergent imaging.  We will give fluids and Pepcid for symptom control.  At this time, the patient clearly appears depressed but denies SI and does not demonstrate acute danger to self or others and there is no indication for involuntary commitment.  We will obtain a psychiatry consult and reassess.  ----------------------------------------- 5:21 PM on 12/20/2019 -----------------------------------------  The patient was seen by Dr. Danella Sensing from psychiatry, who agrees that there  is no indication for commitment or inpatient treatment, and has cleared her.  The patient stated that if she could feel a bit better she actually would want to go home to spend time with family on Christmas.  The patient now states she is feeling better.  She was able to eat some crackers and has had no vomiting in the ED.  Lab work-up is unremarkable.  The patient is stable for discharge home.  I counseled her on continuing appropriate medications for her symptoms, continuing her psychiatric medications, and on follow-up.  Return precautions provided and she expressed understanding.  ____________________________________________   FINAL CLINICAL IMPRESSION(S) / ED DIAGNOSES  Final diagnoses:  Depression, unspecified depression type  Non-intractable vomiting with nausea, unspecified vomiting type      NEW MEDICATIONS STARTED DURING THIS VISIT:  New Prescriptions   No medications on file     Note:  This document was prepared using Dragon voice recognition software and may include unintentional dictation errors.   Arta Silence, MD 12/20/19 1723

## 2019-12-20 NOTE — Discharge Instructions (Addendum)
Continue to take the Zofran as needed for nausea, as well as the other medications that you are taking for your acid reflux.  Continue to take your mental health medications.  Follow-up with your regular doctor and with your psychiatrist as scheduled.  Return to the ER for new, worsening, or persistent severe nausea or vomiting, inability to eat, worsening weakness, fevers, abdominal pain, or any worsening depression symptoms, thoughts of wanting to hurt yourself or anyone else, hearing voices, or any other worsening symptoms that concern you.

## 2019-12-23 ENCOUNTER — Telehealth: Payer: Self-pay | Admitting: Family Medicine

## 2019-12-23 NOTE — Telephone Encounter (Signed)
I have contacted Leslie Wong about this, to see what options would be available.  We're working on this.  I also routed this note to her, with appreciation.  I thank all involved.

## 2019-12-23 NOTE — Telephone Encounter (Signed)
See below. I thank all involved.

## 2019-12-23 NOTE — Telephone Encounter (Signed)
Spoke to [employer]). She has forwarded this request to [place].

## 2019-12-23 NOTE — Telephone Encounter (Signed)
Patient called today She is wanting to be placed in a facility because she stated she can not take care of herself. She stated she has been dehydrated and goes to get fluids and by the time she gets home she is dehydrated again. Patient said she cant take her medications either.    Patient said her daughter has covid and she has no way to do a virtual or receive phone calls to speak with anyone. She wanted [name] to contact her daughter Leslie Wong.

## 2019-12-24 ENCOUNTER — Other Ambulatory Visit: Payer: Self-pay | Admitting: *Deleted

## 2019-12-24 ENCOUNTER — Encounter: Payer: Self-pay | Admitting: *Deleted

## 2019-12-24 NOTE — Patient Outreach (Signed)
[facility]) [place] Telephone Outreach PCP referral 12/23/2019- urgent Unsuccessful telephone outreach attempt # 1- new patient  12/24/2019  Leslie Wong Sep 30, 1956 [id number]  Unsuccessful initial telephone outreach to Leslie Wong, daughter/ caregiver, on DPR dated 02/06/2018 re: [name], 63 y/o female with multiple recent ED visits, none resulting in hospital admission.  Patient has history including, but not limited to, GERD; IBS; HLD; severe bipolar disorder with depression and paranoia.  Urgent MD referral received 12/23/2019, requesting that initial outreach be placed to patient's daughter [name] to explore options for patient placement in psychiatric assisted living facility.  [facility] CSW referral also placed at time of initial referral yesterday.  HIPAA compliant voice mail message left for patient's daughter/ caregiver, requesting return call back.  Plan:  Will place [facility] CM unsuccessful patient outreach letter in mail requesting call back in writing  Will re-attempt [place] telephone outreach within 4 business days if I do not hear back from patient/ caregiver first.  Oneta Rack, RN, BSN, [employer] Coordinator [facility] Care Management  [phone]

## 2019-12-30 ENCOUNTER — Other Ambulatory Visit: Payer: Self-pay

## 2019-12-30 ENCOUNTER — Telehealth: Payer: Self-pay

## 2019-12-30 ENCOUNTER — Other Ambulatory Visit: Payer: Self-pay | Admitting: *Deleted

## 2019-12-30 ENCOUNTER — Encounter: Payer: Self-pay | Admitting: *Deleted

## 2019-12-30 ENCOUNTER — Emergency Department
Admission: EM | Admit: 2019-12-30 | Discharge: 2020-01-02 | Disposition: A | Discharge status: Other Acute Inpatient Hospital | Payer: Medicare Other | Attending: Emergency Medicine | Admitting: Emergency Medicine

## 2019-12-30 DIAGNOSIS — E039 Hypothyroidism, unspecified: Secondary | ICD-10-CM | POA: Insufficient documentation

## 2019-12-30 DIAGNOSIS — K219 Gastro-esophageal reflux disease without esophagitis: Secondary | ICD-10-CM | POA: Diagnosis not present

## 2019-12-30 DIAGNOSIS — Z87891 Personal history of nicotine dependence: Secondary | ICD-10-CM | POA: Diagnosis not present

## 2019-12-30 DIAGNOSIS — F329 Major depressive disorder, single episode, unspecified: Secondary | ICD-10-CM | POA: Insufficient documentation

## 2019-12-30 DIAGNOSIS — G20 Parkinson's disease: Secondary | ICD-10-CM | POA: Diagnosis not present

## 2019-12-30 DIAGNOSIS — F419 Anxiety disorder, unspecified: Secondary | ICD-10-CM | POA: Insufficient documentation

## 2019-12-30 DIAGNOSIS — Z85820 Personal history of malignant melanoma of skin: Secondary | ICD-10-CM | POA: Diagnosis not present

## 2019-12-30 DIAGNOSIS — Z79899 Other long term (current) drug therapy: Secondary | ICD-10-CM | POA: Diagnosis not present

## 2019-12-30 DIAGNOSIS — Z20822 Contact with and (suspected) exposure to covid-19: Secondary | ICD-10-CM | POA: Diagnosis not present

## 2019-12-30 DIAGNOSIS — F32A Depression, unspecified: Secondary | ICD-10-CM

## 2019-12-30 DIAGNOSIS — Z03818 Encounter for observation for suspected exposure to other biological agents ruled out: Secondary | ICD-10-CM | POA: Diagnosis not present

## 2019-12-30 LAB — CBC
HCT: 44 % (ref 36.0–46.0)
Hemoglobin: 15.2 g/dL — ABNORMAL HIGH (ref 12.0–15.0)
MCH: 32.9 pg (ref 26.0–34.0)
MCHC: 34.5 g/dL (ref 30.0–36.0)
MCV: 95.2 fL (ref 80.0–100.0)
Platelets: 220 10*3/uL (ref 150–400)
RBC: 4.62 MIL/uL (ref 3.87–5.11)
RDW: 13.4 % (ref 11.5–15.5)
WBC: 11.7 10*3/uL — ABNORMAL HIGH (ref 4.0–10.5)
nRBC: 0 % (ref 0.0–0.2)

## 2019-12-30 LAB — BASIC METABOLIC PANEL
Anion gap: 11 (ref 5–15)
BUN: 12 mg/dL (ref 8–23)
CO2: 28 mmol/L (ref 22–32)
Calcium: 11 mg/dL — ABNORMAL HIGH (ref 8.9–10.3)
Chloride: 101 mmol/L (ref 98–111)
Creatinine, Ser: 0.75 mg/dL (ref 0.44–1.00)
GFR calc Af Amer: >60 mL/min (ref >60)
GFR calc non Af Amer: >60 mL/min (ref >60)
Glucose, Bld: 112 mg/dL — ABNORMAL HIGH (ref 70–99)
Potassium: 4.3 mmol/L (ref 3.5–5.1)
Sodium: 140 mmol/L (ref 135–145)

## 2019-12-30 LAB — URINALYSIS, COMPLETE (UACMP) WITH MICROSCOPIC
Bilirubin Urine: NEGATIVE
Glucose, UA: NEGATIVE mg/dL
Ketones, ur: NEGATIVE mg/dL
Nitrite: POSITIVE — AB
Protein, ur: 30 mg/dL — AB
Specific Gravity, Urine: 1.014 (ref 1.005–1.030)
WBC, UA: >50 WBC/hpf — ABNORMAL HIGH (ref 0–5)
pH: 6 (ref 5.0–8.0)

## 2019-12-30 MED ORDER — ALUM & MAG HYDROXIDE-SIMETH 200-200-20 MG/5ML PO SUSP
15.0000 mL | Freq: Once | ORAL | Status: AC
  Administered 2019-12-30: 15 mL via ORAL
  Filled 2019-12-30: qty 30

## 2019-12-30 MED ORDER — LIDOCAINE VISCOUS HCL 2 % MT SOLN
15.0000 mL | Freq: Once | OROMUCOSAL | Status: AC
  Administered 2019-12-30: 15 mL via ORAL
  Filled 2019-12-30: qty 15

## 2019-12-30 MED ORDER — CEPHALEXIN 500 MG PO CAPS: 500.0000 mg | ORAL_CAPSULE | Freq: Two times a day (BID) | ORAL | 0 refills | Status: DC

## 2019-12-30 MED ORDER — SODIUM CHLORIDE 0.9 % IV SOLN
1.0000 g | Freq: Once | INTRAVENOUS | Status: AC
  Filled 2019-12-30: qty 10

## 2019-12-30 MED ORDER — LACTATED RINGERS IV BOLUS
1000.0000 mL | Freq: Once | INTRAVENOUS | Status: AC
  Administered 2019-12-30: 1000 mL via INTRAVENOUS

## 2019-12-30 MED ORDER — LIDOCAINE VISCOUS HCL 2 % MT SOLN: 15.0000 mL | OROMUCOSAL | 0 refills | Status: DC | PRN

## 2019-12-30 MED ORDER — SODIUM CHLORIDE 0.9% FLUSH: 3.0000 mL | Freq: Once | INTRAVENOUS | Status: DC

## 2019-12-30 NOTE — Patient Outreach (Signed)
[facility]) Care Management [place] Telephone Outreach Urgent PCP referral/ dated 12/23/2019 [place] Telephone Outreach Care Coordination   12/30/2019  Crosley Malady 1956-05-09 [id number]  Successful initial telephone outreach to Clarita Leber, daughter/ caregiver, on DPR dated 02/06/2018 re: [name], 64 y/o female with multiple recent ED visits, none resulting in hospital admission.  Patient has history including, but not limited to, GERD; IBS; HLD; severe bipolar disorder with depression and paranoia.  Urgent MD referral received 12/23/2019, requesting that initial outreach be placed to patient's daughter [name] to explore options for patient placement in psychiatric assisted living facility.  [facility] CSW referral also placed at time of initial referral.  HIPAA/ identity verified with patient's caregiver/ daughter Clarita Leber; [name] shares that she is currently in route with patient to the ED as advised by PCP earlier today; [place] reports that patient has not been taking care of herself and is not taking medications, not eating, not bathing, and not paying her bills.  [name] has tried to assist patient over the last several months but patient continues living alone and "not taking care of herself.... she is completely neglecting herself, and I am afraid her self-neglect is going to kill her."    Reports that patient has history of extreme depression and bipolar disorder and is currently active with a psychiatrist, but has not had a recent visit with psychiatric provider due to no in-person visits at provider office due to corona virus.  States patient has not paid her bills and no longer has telephone or internet services to complete virtual visit with provider.  [name] clarifies today:  -- she and patient are both interested in patient being admitted to in-patient psychiatric hospital OR to a facility where she will be able to be  supervised on a regular basis; daughter caregiver [name] works and also has small children and is unable to provide constant supervision of patient -- daughter got APS involved over last summer and there was a court date in August at which time patient was deemed competent; daughter calls APS intervention "a waste of time," and would like to avoid further APS referral, as "nothing at all came out of it." -- denies ongoing clinical issues/ care coordination needs, "other than helping me get her placed somewhere they can supervise her and take care of her;" again reiterating that patient is "not eating, not taking medication, performing basic hygiene, and needs psychiatric help."  Discussed basic process for placement of patient, and made daughter aware that this would require FL-2 and PCP involvement; explained that [place] referral had been placed 12/23/19, and encouraged daughter to listen for follow up from [place] tomorrow; I also explained that it is important for daughter to discuss her concerns today during ED visit and encouraged her to ask specifically for hospital CSW intervention, given the severity of [place] Qual[place] stated concerns.  Larissa verbalizes agreement and understanding and stated she will do.  [place] call to Raynaldo Opitz, [facility] CSW to update on situation and Claiborne Billings agrees to contact patient's caregiver/ daughter tomorrow, post- ED visit today.  Plan:  Will close [facility] RN CM case, as patient's needs are strictly for CSW and will make patient's PCP and PCP team aware of same- will route today's note to all  Please re-refer for any clinical/ care coordination issues that arise as indicated  Oneta Rack, RN, BSN, [place] Coordinator [facility] Care Management  [phone]  [phone]             

## 2019-12-30 NOTE — Telephone Encounter (Signed)
Spoke to [place] and informed her that the [facility], Reginia Naas, RN left her a message on 12/24/19.  I gave [name] the number for her to call and she states she will call as soon as she can.  Reginia Naas number is [phone]

## 2019-12-30 NOTE — ED Triage Notes (Addendum)
Pt presents to ED via POV with her daughter, was referred by PCP due to "self neglect". Per daughter pt has had anything to eat or drink "in months". Pt's daughter reports patient has not been taking care of herself and pt's daughter is not able to care for her. Pt states "my skin is deadly deadly dry". Pt's daughter states that she wants placement for patient, states she was told by her PCP that she needed to be placed in either psychiatric ward or LTC.
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
## 2019-12-30 NOTE — ED Notes (Signed)
tts on computer with pt now   Family with pt.

## 2019-12-30 NOTE — Telephone Encounter (Signed)
I will ask Leslie Wong about the status on placement information.  I routed her this note.   In the meantime, I think that UC vs ER still applies given the possible need for IVF.  Thanks.

## 2019-12-30 NOTE — Telephone Encounter (Signed)
Leslie Wong says she has actually been off her meds for about 2 months and that she actually is in a much better state mentally but her physical health has drastically declined.  Leslie Wong says she looks terrible, has lost weight, weak, etc.  No one has contacted [place] about any long term placement.  Leslie Wong says this needs to be done ASAP.  Daughter is concerned that her Mom won't live much longer without some help.  Leslie Wong says she can get her transported to the hospital again but really feels that they need to admit her and do a workup on her rather than send her home.  Leslie Wong says she is convinced that most of this is from severe depression.

## 2019-12-30 NOTE — ED Notes (Signed)
Pt brought in by daughter. Pt has not been eating ,drinking or taking medications pet daughter. Pt has not been sleeping.  Pt denies SI or HI.  Denies drugs or etoh use.  Pt is calm and cooperative.

## 2019-12-30 NOTE — Telephone Encounter (Signed)
Noted.  Thanks.  I appreciate the help of all involved.   

## 2019-12-30 NOTE — Telephone Encounter (Signed)
If she has been off her meds for 1 week, too weak to stand, and with chills, then I don't think that virtual or in person appointment here is going to change the plan since we don't have IVF here.  I think she is going to need to go to UC or ER, likely for IVF.    Also, I asked for staff to check on placement options for the patient in the longer term.  Did anyone contact [place] about that?

## 2019-12-30 NOTE — ED Provider Notes (Signed)
[facility] Emergency Department Provider Note   ____________________________________________   First MD Initiated Contact with Patient 12/30/19 1717     (approximate)  I have reviewed the triage vital signs and the nursing notes.   HISTORY  Chief Complaint Failure To Thrive    HPI Leslie Wong is a 64 y.o. female with past medical history of GERD, depression presents to the ED for poor appetite.  Patient reports that she has been having trouble eating and drinking for least the past month.  She states she is able to swallow but then she has "bubbles" in her upper abdomen which are uncomfortable for her.  She reports feeling nauseous at times but has not vomited and denies any abdominal pain.  She has not had any fevers, cough, chest pain, shortness of breath, or changes in bowel movements.  She has been taking omeprazole and Carafate with some relief and has endoscopy scheduled with GI on January 20.  Her daughter brought her into the ED today at the request of her PCP.  Daughter is concerned that patient has had very poor p.o. intake and has not been adequately caring for herself.  Daughter states that she has barely left her apartment in the past few months, does not shower or generally care for herself.  Patient notes that she has been feeling very depressed, but denies any suicidal or homicidal ideation.        Past Medical History:  Diagnosis Date  . Back pain   . Cancer ([place])    skin  . Depression    BAD  . Gastroparesis   . GERD (gastroesophageal reflux disease)   . Hypothyroidism   . Insomnia   . Migraines   . Parkinson disease ([place])    per [place] [facility], dx'd 2019  . Recurrent cold sores   . Shoulder bursitis   . Urge incontinence     Patient Active Problem List   Diagnosis Date Noted  . Depression 12/11/2019  . Paranoia ([place])   . Cannabis abuse 05/28/2019  . Bipolar 1 disorder, mixed, severe ([place]) 05/27/2019  . Opiate  dependence ([place]) 04/25/2019  . Cracking skin 01/27/2019  . Delusion ([place]) 09/26/2018  . Night sweats 05/13/2018  . Pupil asymmetry 05/13/2018  . DDD (degenerative disc disease), cervical 04/23/2018  . Parkinson disease ([place])   . HLD (hyperlipidemia) 03/10/2016  . Advance care planning 03/10/2016  . Cervical radiculopathy 07/30/2015  . Osteopenia 04/18/2014  . Medicare annual wellness visit, subsequent 03/18/2014  . Shoulder pain 01/17/2013  . Chronic back pain 09/14/2011  . Hypercalcemia 03/25/2010  . INCONTINENCE, URGE 04/16/2009  . CARPAL TUNNEL SYNDROME, BILATERAL 09/24/2008  . 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 Hypothyroidism 06/05/2007  . Bipolar affective disorder, current episode hypomanic ([place]) 06/05/2007  . MIGRAINE HEADACHE 06/05/2007  . Gastroesophageal reflux disease with hiatal hernia 06/05/2007  . IRRITABLE BOWEL SYNDROME 06/05/2007  . FIBROCYSTIC BREAST DISEASE 06/05/2007    Past Surgical History:  Procedure Laterality Date  . ABDOMINAL HYSTERECTOMY  1999   total  . APPENDECTOMY    . ESOPHAGOGASTRODUODENOSCOPY  01/2006   negative except small hiatal hernia  . ESOPHAGOGASTRODUODENOSCOPY  02/24/2015   See report  . LAPAROSCOPY     for endometriosis  . OVARIAN CYST REMOVAL  1990   unilateral  . TONSILLECTOMY AND ADENOIDECTOMY      Prior to Admission medications   Medication Sig Start Date End Date Taking? Authorizing Provider  albuterol (VENTOLIN HFA) 108 (90 Base) MCG/ACT  inhaler Inhale 2 puffs into the lungs every 6 (six) hours as needed for wheezing or shortness of breath. 07/25/19   Tonia Ghent, MD  ascorbic acid (VITAMIN C) 500 MG tablet Take 1 tablet (500 mg total) by mouth daily. Patient not taking: Reported on 08/27/2019 07/25/19   Tonia Ghent, MD  cephALEXin (KEFLEX) 500 MG capsule Take 1 capsule (500 mg total) by mouth 2 (two) times daily for 7 days. 12/30/19 01/06/20  Blake Divine, MD  cholecalciferol (VITAMIN D) 25 MCG (1000 UT) tablet Take  1 tablet (1,000 Units total) by mouth daily. Patient not taking: Reported on 08/27/2019 07/25/19   Tonia Ghent, MD  Diphenhyd-Hydrocort-Nystatin (FIRST-DUKES MOUTHWASH) SUSP Use as directed 5 mLs in the mouth or throat 4 (four) times daily as needed. 08/23/19   Tonia Ghent, MD  doxepin (SINEQUAN) 25 MG capsule Take 50 mg by mouth at bedtime. 08/21/19   [provider]  famotidine (PEPCID) 20 MG tablet Take 1 tablet (20 mg total) by mouth 2 (two) times daily. 12/11/19   Carrie Mew, MD  fluocinonide cream (LIDEX) [id number] % Apply 1 application topically 2 (two) times daily as needed. 08/16/19   Tonia Ghent, MD  hydrocerin (EUCERIN) CREA Apply 1 application topically 2 (two) times daily. 04/29/19   Patrecia Pour, NP  hydrOXYzine (ATARAX/VISTARIL) 50 MG tablet Take 1 tablet (50 mg total) by mouth every 6 (six) hours as needed for anxiety. 12/10/19   Merlyn Lot, MD  lamoTRIgine (LAMICTAL) 25 MG tablet Take 2 tablets (50 mg total) by mouth daily. 05/30/19   [name], [name], MD  levothyroxine (SYNTHROID) 88 MCG tablet TAKE 1 TABLET(88 MCG) BY MOUTH DAILY 10/08/19   Tonia Ghent, MD  lidocaine (XYLOCAINE) 2 % solution Use as directed 15 mLs in the mouth or throat as needed for mouth pain. 12/30/19   Blake Divine, MD  OLANZapine (ZYPREXA) 15 MG tablet Take 15 mg by mouth daily. 05/31/19   [provider]  Omega-3 Fatty Acids (FISH OIL) 1000 MG CAPS Take 1 capsule (1,000 mg total) by mouth daily. Patient not taking: Reported on 08/27/2019 07/25/19   Tonia Ghent, MD  omeprazole (PRILOSEC) 40 MG capsule Take 40 mg by mouth 2 (two) times daily. 12/13/19   [provider]  ondansetron (ZOFRAN-ODT) 4 MG disintegrating tablet Take 1 tablet (4 mg total) by mouth 3 (three) times daily as needed for nausea or vomiting. 07/26/19   Tonia Ghent, MD  propranolol (INDERAL) 10 MG tablet Take 1 tablet (10 mg total) by mouth 2 (two) times daily. 07/25/19   Tonia Ghent,  MD  SENOKOT 8.6 MG tablet Take 2 tablets by mouth daily. 07/14/19   [provider]  sucralfate (CARAFATE) 1 g tablet Take 1 tablet (1 g total) by mouth 4 (four) times daily. 12/11/19   Carrie Mew, MD  tiZANidine (ZANAFLEX) 4 MG tablet Take 1 tablet (4 mg total) by mouth every 8 (eight) hours as needed for muscle spasms. 07/11/19   Tonia Ghent, MD  valACYclovir (VALTREX) 500 MG tablet TAKE 1 TABLET(500 MG) BY MOUTH TWICE DAILY 07/23/19   Tonia Ghent, MD  vitamin B-12 (CYANOCOBALAMIN) 1000 MCG tablet Take 1 tablet (1,000 mcg total) by mouth daily. Patient not taking: Reported on 08/27/2019 07/25/19   Tonia Ghent, MD  Vitamin D, Ergocalciferol, (DRISDOL) 1.25 MG (50000 UT) CAPS capsule Take 1 capsule by mouth once a week.    [provider]  Allergies Doxepin, Valproic acid, Aspirin, Atorvastatin, Baclofen, Codeine, Duloxetine, Erythromycin, Gabapentin, Metoclopramide hcl, Nortriptyline, Nsaids, Nucynta er [tapentadol hcl er], Nucynta [tapentadol], Olanzapine, Prednisone, Pregabalin, Seroquel [quetiapine fumerate], Sulfa antibiotics, Topamax, and Vicodin [hydrocodone-acetaminophen]  Family History  Problem Relation Age of Onset  . Hypertension Mother   . Arthritis Mother   . Diabetes Mother   . Stroke Mother   . Cancer Father        lung  . Colon cancer Neg Hx   . Breast cancer Neg Hx     Social History Social History   Tobacco Use  . Smoking status: Current Some Day Smoker    Packs/day: 1.00    Years: 42.50    Pack years: 42.50    Types: Cigarettes  . Smokeless tobacco: Former Network engineer Use Topics  . Alcohol use: No    Alcohol/week: 0.0 standard drinks  . Drug use: No    Review of Systems  Constitutional: No fever/chills.  Positive for poor appetite. Eyes: No visual changes. ENT: No sore throat. Cardiovascular: Denies chest pain. Respiratory: Denies shortness of breath. Gastrointestinal: No abdominal pain.  Positive for nausea,  no vomiting.  No diarrhea.  No constipation. Genitourinary: Negative for dysuria. Musculoskeletal: Negative for back pain. Skin: Negative for rash. Neurological: Negative for headaches, focal weakness or numbness.  Positive for depression.  ____________________________________________   PHYSICAL EXAM:  VITAL SIGNS: ED Triage Vitals  Enc Vitals Group     BP 12/30/19 1613 106/80     Pulse Rate 12/30/19 1613 92     Resp 12/30/19 1613 20     Temp 12/30/19 1613 99.8 F (37.7 C)     Temp Source 12/30/19 1613 Oral     SpO2 12/30/19 1613 94 %     Weight 12/30/19 1611 100 lb 1.4 oz (45.4 kg)     Height 12/30/19 1611 4\' 11"  (1.499 m)     Head Circumference --      Peak Flow --      Pain Score 12/30/19 1609 0     Pain Loc --      Pain Edu? --      Excl. in [place]? --     Constitutional: Alert and oriented. Eyes: Conjunctivae are normal. Head: Atraumatic. Nose: No congestion/rhinnorhea. Mouth/Throat: Mucous membranes are moist. Neck: Normal ROM Cardiovascular: Normal rate, regular rhythm. Grossly normal heart sounds. Respiratory: Normal respiratory effort.  No retractions. Lungs CTAB. Gastrointestinal: Soft and nontender. No distention. Genitourinary: deferred Musculoskeletal: No lower extremity tenderness nor edema. Neurologic:  Normal speech and language. No gross focal neurologic deficits are appreciated. Skin:  Skin is warm, dry and intact. No rash noted. Psychiatric: Mood and affect are normal. Speech and behavior are normal.  ____________________________________________   LABS (all labs ordered are listed, but only abnormal results are displayed)  Labs Reviewed  BASIC METABOLIC PANEL - Abnormal; Notable for the following components:      Result Value   Glucose, Bld 112 (*)    Calcium 11.0 (*)    All other components within normal limits  CBC - Abnormal; Notable for the following components:   WBC 11.7 (*)    Hemoglobin 15.2 (*)    All other components within normal  limits  URINALYSIS, COMPLETE (UACMP) WITH MICROSCOPIC - Abnormal; Notable for the following components:   Color, Urine AMBER (*)    APPearance HAZY (*)    Hgb urine dipstick MODERATE (*)    Protein, ur 30 (*)    Nitrite POSITIVE (*)  Leukocytes,Ua SMALL (*)    WBC, UA >50 (*)    Bacteria, UA RARE (*)    All other components within normal limits  URINE CULTURE  CBG MONITORING, ED   ____________________________________________  EKG  ED ECG REPORT I, Blake Divine, the attending physician, personally viewed and interpreted this ECG.   Date: 12/30/2019  EKG Time: 16:32  Rate: 84  Rhythm: normal sinus rhythm  Axis: Normal  Intervals:none  ST&T Change: None   PROCEDURES  Procedure(s) performed (including Critical Care):  Procedures   ____________________________________________   INITIAL IMPRESSION / ASSESSMENT AND PLAN / ED COURSE       64 year old female presents to the ED with poor appetite and p.o. intake over the past month associated with "bubbling" in her upper abdomen as well as significant depression.  Based on her history, she seems to have components of both depression as well as GERD versus PUD contributing to her poor appetite and p.o. intake.  She however does not appear significantly dehydrated and her lab work is reassuring without electrolyte abnormality.  EKG also shows no evidence of arrhythmia or ischemia.  UA does show evidence of UTI, will treat with Rocephin here in the ED and culture.  Patient reports improvement in symptoms following GI cocktail and she was able to eat a sandwich without difficulty.  Given patient's worsening depression with inability to care for herself, will consult psychiatry to further assess for possible psychiatric admission.  However, if she does not meet criteria for psychiatric admission she will likely need to follow-up with GI as an outpatient.  I would add viscous lidocaine for use as needed as well as Keflex for her  UTI.  Patient turned over pending further evaluation by psychiatry.      ____________________________________________   FINAL CLINICAL IMPRESSION(S) / ED DIAGNOSES  Final diagnoses:  Depression, unspecified depression type  Gastroesophageal reflux disease, unspecified whether esophagitis present     ED Discharge Orders         Ordered    lidocaine (XYLOCAINE) 2 % solution  As needed     12/30/19 2318    cephALEXin (KEFLEX) 500 MG capsule  2 times daily     12/30/19 2318           Note:  This document was prepared using Dragon voice recognition software and may include unintentional dictation errors.   Blake Divine, MD 12/30/19 [phone]

## 2019-12-30 NOTE — Telephone Encounter (Signed)
Pt is not able to eat or drink anything; pt has not taken med in over one wk. Pt has gone to ED x 3 since 12/10/19. Marylou Flesher said will give pt IV  And send pt home. Pt having chills ? Fever. Complains with hurting all over.pt has generalized weakness; pt cannot stand by herself. Pt is not able to stand up and too cold to take a shower. No cough or SOB,no rash. [name] thinks pt has had some diarrhea and abd pain. Pt is fatigued and not sleeping. Has dry mouth and lightheaded. [name] is not sure when last time pt urinated. Marylou Flesher does not want to take pt to ED because ED will not admit pt; pt attempted to do voluntary commitment to behavioral health but pt was not admitted that way. [name] does not think virtual visit will help but she is reaching out to Dr Damita Dunnings for further guidance and request cb. ED precautions given again and [place] voiced understanding.

## 2019-12-30 NOTE — Telephone Encounter (Signed)
Patient's daughter called back.  Patient refuses to go to the ER. She's on her way to patient and will try to insist that she go to ER. Patient's daughter is very frustrated because she needs help. She said she'll wait for the social worker to call.

## 2019-12-30 NOTE — ED Notes (Signed)
Called [facility] for consult spoke to Hannah  2006

## 2019-12-30 NOTE — Telephone Encounter (Signed)
Leslie Wong voiced understanding and will go by pts home and if pt will not go with [place] to ED pt will call 911 to take pt to ED. FYI to Dr Damita Dunnings.

## 2019-12-30 NOTE — Telephone Encounter (Signed)
Noted. Thanks.  I'll await SW input.

## 2019-12-31 ENCOUNTER — Other Ambulatory Visit: Payer: Self-pay | Admitting: *Deleted

## 2019-12-31 LAB — RESPIRATORY PANEL BY RT PCR (FLU A&B, COVID)
Influenza A by PCR: NEGATIVE
Influenza B by PCR: NEGATIVE
SARS Coronavirus 2 by RT PCR: NEGATIVE

## 2019-12-31 MED ORDER — SUCRALFATE 1 G PO TABS
1.0000 g | ORAL_TABLET | Freq: Four times a day (QID) | ORAL | Status: DC
  Administered 2019-12-31 – 2020-01-02 (×7): 1 g via ORAL
  Filled 2019-12-31 (×12): qty 1

## 2019-12-31 MED ORDER — PANTOPRAZOLE SODIUM 40 MG PO TBEC
40.0000 mg | DELAYED_RELEASE_TABLET | Freq: Every day | ORAL | Status: DC
  Administered 2019-12-31 – 2020-01-02 (×3): 40 mg via ORAL
  Filled 2019-12-31 (×3): qty 1

## 2019-12-31 MED ORDER — CEPHALEXIN 500 MG PO CAPS
500.0000 mg | ORAL_CAPSULE | Freq: Three times a day (TID) | ORAL | Status: DC
  Administered 2019-12-31 – 2020-01-02 (×6): 500 mg via ORAL
  Filled 2019-12-31 (×9): qty 1

## 2019-12-31 MED ORDER — PROPRANOLOL HCL 10 MG PO TABS
10.0000 mg | ORAL_TABLET | Freq: Two times a day (BID) | ORAL | Status: DC
  Administered 2019-12-31 – 2020-01-02 (×5): 10 mg via ORAL
  Filled 2019-12-31 (×6): qty 1

## 2019-12-31 MED ORDER — OLANZAPINE 5 MG PO TABS
15.0000 mg | ORAL_TABLET | Freq: Every day | ORAL | Status: DC
  Administered 2019-12-31 – 2020-01-02 (×3): 15 mg via ORAL
  Filled 2019-12-31 (×3): qty 1

## 2019-12-31 MED ORDER — HALOPERIDOL LACTATE 5 MG/ML IJ SOLN: 2.0000 mg | Freq: Four times a day (QID) | INTRAMUSCULAR | Status: DC | PRN

## 2019-12-31 MED ORDER — LEVOTHYROXINE SODIUM 88 MCG PO TABS
88.0000 ug | ORAL_TABLET | Freq: Every day | ORAL | Status: DC
  Administered 2019-12-31: 88 ug via ORAL
  Filled 2019-12-31 (×3): qty 1

## 2019-12-31 MED ORDER — FAMOTIDINE 20 MG PO TABS
20.0000 mg | ORAL_TABLET | Freq: Two times a day (BID) | ORAL | Status: DC
  Administered 2019-12-31 – 2020-01-02 (×6): 20 mg via ORAL
  Filled 2019-12-31 (×6): qty 1

## 2019-12-31 MED ORDER — LORAZEPAM 2 MG/ML IJ SOLN
1.0000 mg | Freq: Four times a day (QID) | INTRAMUSCULAR | Status: DC | PRN
  Administered 2019-12-31: 1 mg via INTRAMUSCULAR
  Filled 2019-12-31: qty 1

## 2019-12-31 MED ORDER — ALBUTEROL SULFATE HFA 108 (90 BASE) MCG/ACT IN AERS
2.0000 | INHALATION_SPRAY | Freq: Four times a day (QID) | RESPIRATORY_TRACT | Status: DC | PRN
  Filled 2019-12-31: qty 6.7

## 2019-12-31 MED ORDER — LAMOTRIGINE 100 MG PO TABS
50.0000 mg | ORAL_TABLET | Freq: Every day | ORAL | Status: DC
  Administered 2019-12-31 – 2020-01-02 (×3): 50 mg via ORAL
  Filled 2019-12-31: qty 1
  Filled 2019-12-31: qty 2
  Filled 2019-12-31: qty 1

## 2019-12-31 NOTE — ED Notes (Signed)
Patient back in bed. Awake and watching TV.

## 2019-12-31 NOTE — ED Notes (Signed)
Hourly rounding reveals patient sleeping in room. No complaints, stable, in no acute distress. Q15 minute rounds and monitoring via Rover and Officer to continue.  

## 2019-12-31 NOTE — ED Notes (Signed)
Hourly rounding reveals patient sleeping in room. No complaints, stable, in no acute distress. Q15 minute rounds and monitoring via Security Cameras to continue. 

## 2019-12-31 NOTE — ED Notes (Signed)
Referral information for Psychiatric Hospitalization faxed to;    [facility] [phone]- [phone]) [phone]  . [place] ([phone]),  . Mikel Cella [phone], [phone], [phone] or [phone]),   . [place] [phone] or [phone])  . [facility] [phone]),   . [place] [facility] [phone]- [phone]),   . [place] [phone] or [phone]),   .

## 2019-12-31 NOTE — ED Notes (Signed)
Patient up and walking around room, requesting another breakfast tray, states "im Still Hungry". Second breakfast tray provided to patient. Safety maintained will monitor.

## 2019-12-31 NOTE — ED Notes (Signed)
Hourly rounding reveals patient in room. No complaints, stable, in no acute distress. Q15 minute rounds and monitoring via Security Cameras to continue. 

## 2019-12-31 NOTE — BH Assessment (Signed)
Referral information for Psychiatric Hospitalization re-faxed to:    [facility] [phone]) [phone]   Rosana Hoes [phone]),   [place] [phone], [phone], [phone] or [phone]),    [facility] [phone] or [phone])   [place] [phone]),    [facility] [phone] -or- [phone]),   . [place] [phone]  . [place] [phone]),   . [address]Lurena Joiner [phone]),   . [facility] [phone] or [phone])  . [place] [phone] or [phone]),   . [facility] [phone])

## 2019-12-31 NOTE — ED Notes (Signed)
Report off to vanessa rn 

## 2019-12-31 NOTE — ED Notes (Signed)
Pt. Transferred to Room 20 from 25. Pt. Oriented to unit including Q15 minute rounds as well as Officers for their protection. Patient is alert and oriented, warm and dry in no acute distress. Patient denies SI, HI, and AVH. Pt. Encouraged to let me know if needs arise.

## 2019-12-31 NOTE — Patient Outreach (Signed)
[place] [facility]) Care Management  12/31/2019  Leslie Wong 1956-01-10 [id number]   CSW made an initial attempt to try and contact patient's daughter, [name] today to perform phone assessment, as well as assess and assist with social needs and services, without success. A HIPPA compliant message was left for patient's daughter on voicemail (ph#: [phone]). CSW is currently awaiting a return call. CSW will make a second outreach attempt within the next 3-4 days, if CSW does not receive a return call from patient in the meantime.    Raynaldo Opitz, LCSW [facility]  Clinical Social Worker cell #: [phone]

## 2019-12-31 NOTE — ED Notes (Signed)
Pt ambulated to room 20.  Warm blankets given.

## 2019-12-31 NOTE — ED Notes (Signed)
Meal tray provided.

## 2019-12-31 NOTE — ED Notes (Signed)
IVC/  PENDING  PLACEMENT 

## 2019-12-31 NOTE — ED Notes (Signed)
Pt ambulated to bathroom 

## 2019-12-31 NOTE — ED Provider Notes (Signed)
-----------------------------------------   12:48 AM on 12/31/2019 -----------------------------------------  Patient was evaluated by [facility] psychiatrist Dr. Lavon Paganini who recommends admission to inpatient psychiatry service.  She does meet criteria for IVC.  Recommends Haldol and Ativan as needed.  These will be placed in the computer.   Paulette Blanch, MD 12/31/19 [phone]

## 2019-12-31 NOTE — ED Notes (Signed)
Assumed care of patient patient sleeping comfortably, awakens to name. Denies any concerns or issues this morning. Denies SI/HI/HV. Safety maintained. SOC completed. Patient awaiting in house admission.

## 2019-12-31 NOTE — ED Notes (Signed)
Report to include Situation, Background, Assessment, and Recommendations received from Amy RN. Patient alert and oriented, warm and dry, in no acute distress. Patient denies SI, HI, AVH and pain. Patient made aware of Q15 minute rounds and security cameras for their safety. Patient instructed to come to me with needs or concerns.  

## 2019-12-31 NOTE — ED Notes (Signed)
Sleeping but awakened for lunch

## 2020-01-01 LAB — URINE CULTURE: Culture: >=100000 — AB

## 2020-01-01 NOTE — ED Notes (Signed)
IVC/ Pending Placement by CSW

## 2020-01-01 NOTE — ED Notes (Signed)
Report to include Situation, Background, Assessment, and Recommendations received from Amy RN. Patient alert and oriented, warm and dry, in no acute distress. Patient denies SI, HI, AVH and pain. Patient made aware of Q15 minute rounds and security cameras for their safety. Patient instructed to come to me with needs or concerns.

## 2020-01-01 NOTE — ED Notes (Signed)
Hourly rounding reveals patient in room. No complaints, stable, in no acute distress. Q15 minute rounds and monitoring via Security Cameras to continue. 

## 2020-01-01 NOTE — ED Notes (Signed)
Hourly rounding reveals patient sleeping in room. No complaints, stable, in no acute distress. Q15 minute rounds and monitoring via Security Cameras to continue. 

## 2020-01-01 NOTE — ED Notes (Signed)
ED BHU  Is the patient under IVC or is there intent for IVC: Yes.   Is the patient medically cleared: Yes.   Is there vacancy in the ED BHU: Yes.   Is the population mix appropriate for patient: Yes.   Is the patient awaiting placement in inpatient or outpatient setting: Yes.   Has the patient had a psychiatric consult: Yes.   Survey of unit performed for contraband, proper placement and condition of furniture, tampering with fixtures in bathroom, shower, and each patient room: Yes.  ; Findings:  APPEARANCE/BEHAVIOR Calm and cooperative NEURO ASSESSMENT Orientation: oriented x3  She reports 9/10 chronic back pain present  She verbalizes that she takes oxycodone 5mg  for her pain Hallucinations: No.None noted (Hallucinations)  Denies  Speech: Normal Gait: normal RESPIRATORY ASSESSMENT Even  Unlabored respirations  CARDIOVASCULAR ASSESSMENT Pulses equal   regular rate  Skin warm and dry   GASTROINTESTINAL ASSESSMENT no GI complaint EXTREMITIES Full ROM  PLAN OF CARE Provide calm/safe environment. Vital signs assessed twice daily. ED BHU Assessment once each 12-hour shift. Assure the ED provider has rounded once each shift. Provide and encourage hygiene. Provide redirection as needed. Assess for escalating behavior; address immediately and inform ED provider.  Assess family dynamic and appropriateness for visitation as needed: Yes.  ; If necessary, describe findings:  Educate the patient/family about BHU procedures/visitation: Yes.  ; If necessary, describe findings:

## 2020-01-01 NOTE — ED Notes (Signed)

## 2020-01-01 NOTE — ED Provider Notes (Signed)
-----------------------------------------   5:44 AM on 01/01/2020 -----------------------------------------   Blood pressure 113/85, pulse 81, temperature 98.9 F (37.2 C), temperature source Oral, resp. rate 18, height 4\' 11"  (1.499 m), weight 45.4 kg, SpO2 97 %.  The patient is sleeping at this time.  There have been no acute events since the last update.  Awaiting disposition plan from Behavioral Medicine and/or Social Work team(s).   Paulette Blanch, MD 01/01/20 [phone]

## 2020-01-01 NOTE — ED Notes (Signed)
BEHAVIORAL HEALTH ROUNDING Patient sleeping: No. Patient alert and oriented: yes Behavior appropriate: Yes.  ; If no, describe:  Nutrition and fluids offered: yes Toileting and hygiene offered: Yes  Sitter present: q15 minute observations and security camera monitoring   

## 2020-01-01 NOTE — ED Notes (Signed)
Pt up to bathroom.  Had BM on herself.  Given new scrubs, underwear and socks and wipes.  Pt cleaned herself up and back in bed.

## 2020-01-01 NOTE — ED Notes (Addendum)
BEHAVIORAL HEALTH ROUNDING Patient sleeping: Yes.   Patient alert and oriented: eyes closed  Appears asleep Behavior appropriate: Yes.  ; If no, describe:  Nutrition and fluids offered: Yes  Toileting and hygiene offered: sleeping Sitter present: q 15 minute observations and security camera monitoring  

## 2020-01-01 NOTE — ED Notes (Signed)
She has ambulated to and from the BR with a steady gait - no verbalized needs or concerns at this time

## 2020-01-01 NOTE — ED Notes (Signed)
BEHAVIORAL HEALTH ROUNDING Patient sleeping: Yes.   Patient alert and oriented: eyes closed  Appears asleep Behavior appropriate: Yes.  ; If no, describe:  Nutrition and fluids offered: Yes  Toileting and hygiene offered: sleeping Sitter present: q 15 minute observations and security camera monitoring  

## 2020-01-02 ENCOUNTER — Inpatient Hospital Stay
Admission: RE | Admit: 2020-01-02 | Discharge: 2020-01-10 | DRG: 885 | Disposition: A | Discharge status: Home / Self Care | Payer: Medicare Other | Source: Intra-hospital | Attending: Psychiatry | Admitting: Psychiatry

## 2020-01-02 ENCOUNTER — Other Ambulatory Visit: Payer: Self-pay

## 2020-01-02 ENCOUNTER — Ambulatory Visit: Admit: 2020-01-02 | Payer: Medicare Other | Admitting: General Surgery

## 2020-01-02 ENCOUNTER — Encounter: Payer: Self-pay | Admitting: Psychiatry

## 2020-01-02 DIAGNOSIS — F329 Major depressive disorder, single episode, unspecified: Secondary | ICD-10-CM | POA: Diagnosis present

## 2020-01-02 DIAGNOSIS — Z885 Allergy status to narcotic agent status: Secondary | ICD-10-CM

## 2020-01-02 DIAGNOSIS — K3184 Gastroparesis: Secondary | ICD-10-CM | POA: Diagnosis present

## 2020-01-02 DIAGNOSIS — Z79899 Other long term (current) drug therapy: Secondary | ICD-10-CM

## 2020-01-02 DIAGNOSIS — E781 Pure hyperglyceridemia: Secondary | ICD-10-CM | POA: Diagnosis present

## 2020-01-02 DIAGNOSIS — G8929 Other chronic pain: Secondary | ICD-10-CM | POA: Diagnosis present

## 2020-01-02 DIAGNOSIS — Z882 Allergy status to sulfonamides status: Secondary | ICD-10-CM

## 2020-01-02 DIAGNOSIS — G4700 Insomnia, unspecified: Secondary | ICD-10-CM | POA: Diagnosis present

## 2020-01-02 DIAGNOSIS — H9192 Unspecified hearing loss, left ear: Secondary | ICD-10-CM | POA: Diagnosis present

## 2020-01-02 DIAGNOSIS — B9620 Unspecified Escherichia coli [E. coli] as the cause of diseases classified elsewhere: Secondary | ICD-10-CM | POA: Diagnosis present

## 2020-01-02 DIAGNOSIS — K219 Gastro-esophageal reflux disease without esophagitis: Secondary | ICD-10-CM | POA: Diagnosis present

## 2020-01-02 DIAGNOSIS — J449 Chronic obstructive pulmonary disease, unspecified: Secondary | ICD-10-CM | POA: Diagnosis present

## 2020-01-02 DIAGNOSIS — Z6379 Other stressful life events affecting family and household: Secondary | ICD-10-CM

## 2020-01-02 DIAGNOSIS — Z20822 Contact with and (suspected) exposure to covid-19: Secondary | ICD-10-CM | POA: Diagnosis not present

## 2020-01-02 DIAGNOSIS — I10 Essential (primary) hypertension: Secondary | ICD-10-CM | POA: Diagnosis present

## 2020-01-02 DIAGNOSIS — Z9119 Patient's noncompliance with other medical treatment and regimen: Secondary | ICD-10-CM

## 2020-01-02 DIAGNOSIS — F314 Bipolar disorder, current episode depressed, severe, without psychotic features: Secondary | ICD-10-CM | POA: Diagnosis not present

## 2020-01-02 DIAGNOSIS — Z886 Allergy status to analgesic agent status: Secondary | ICD-10-CM

## 2020-01-02 DIAGNOSIS — F17210 Nicotine dependence, cigarettes, uncomplicated: Secondary | ICD-10-CM | POA: Diagnosis present

## 2020-01-02 DIAGNOSIS — Z9049 Acquired absence of other specified parts of digestive tract: Secondary | ICD-10-CM

## 2020-01-02 DIAGNOSIS — R45851 Suicidal ideations: Secondary | ICD-10-CM | POA: Diagnosis present

## 2020-01-02 DIAGNOSIS — Z9114 Patient's other noncompliance with medication regimen: Secondary | ICD-10-CM

## 2020-01-02 DIAGNOSIS — Z79890 Hormone replacement therapy (postmenopausal): Secondary | ICD-10-CM

## 2020-01-02 DIAGNOSIS — K259 Gastric ulcer, unspecified as acute or chronic, without hemorrhage or perforation: Secondary | ICD-10-CM | POA: Diagnosis present

## 2020-01-02 DIAGNOSIS — G43909 Migraine, unspecified, not intractable, without status migrainosus: Secondary | ICD-10-CM | POA: Diagnosis present

## 2020-01-02 DIAGNOSIS — F3163 Bipolar disorder, current episode mixed, severe, without psychotic features: Secondary | ICD-10-CM | POA: Diagnosis present

## 2020-01-02 DIAGNOSIS — Z881 Allergy status to other antibiotic agents status: Secondary | ICD-10-CM

## 2020-01-02 DIAGNOSIS — Z8249 Family history of ischemic heart disease and other diseases of the circulatory system: Secondary | ICD-10-CM

## 2020-01-02 DIAGNOSIS — F419 Anxiety disorder, unspecified: Secondary | ICD-10-CM | POA: Diagnosis present

## 2020-01-02 DIAGNOSIS — G20 Parkinson's disease: Secondary | ICD-10-CM | POA: Diagnosis present

## 2020-01-02 DIAGNOSIS — Z634 Disappearance and death of family member: Secondary | ICD-10-CM

## 2020-01-02 DIAGNOSIS — Z888 Allergy status to other drugs, medicaments and biological substances status: Secondary | ICD-10-CM

## 2020-01-02 DIAGNOSIS — K5900 Constipation, unspecified: Secondary | ICD-10-CM | POA: Diagnosis present

## 2020-01-02 DIAGNOSIS — Z90710 Acquired absence of both cervix and uterus: Secondary | ICD-10-CM | POA: Diagnosis not present

## 2020-01-02 DIAGNOSIS — Z880 Allergy status to penicillin: Secondary | ICD-10-CM

## 2020-01-02 DIAGNOSIS — E039 Hypothyroidism, unspecified: Secondary | ICD-10-CM | POA: Diagnosis present

## 2020-01-02 DIAGNOSIS — N390 Urinary tract infection, site not specified: Secondary | ICD-10-CM | POA: Diagnosis present

## 2020-01-02 DIAGNOSIS — Z9089 Acquired absence of other organs: Secondary | ICD-10-CM

## 2020-01-02 DIAGNOSIS — F3130 Bipolar disorder, current episode depressed, mild or moderate severity, unspecified: Secondary | ICD-10-CM | POA: Diagnosis present

## 2020-01-02 DIAGNOSIS — F319 Bipolar disorder, unspecified: Secondary | ICD-10-CM | POA: Diagnosis not present

## 2020-01-02 DIAGNOSIS — Z823 Family history of stroke: Secondary | ICD-10-CM

## 2020-01-02 DIAGNOSIS — E559 Vitamin D deficiency, unspecified: Secondary | ICD-10-CM | POA: Diagnosis present

## 2020-01-02 LAB — HEPATIC FUNCTION PANEL
ALT: 12 U/L (ref 0–44)
AST: 18 U/L (ref 15–41)
Albumin: 3.9 g/dL (ref 3.5–5.0)
Alkaline Phosphatase: 98 U/L (ref 38–126)
Bilirubin, Direct: <0.1 mg/dL (ref 0.0–0.2)
Total Bilirubin: 0.3 mg/dL (ref 0.3–1.2)
Total Protein: 6.3 g/dL — ABNORMAL LOW (ref 6.5–8.1)

## 2020-01-02 LAB — TSH: TSH: 4.327 u[IU]/mL (ref 0.350–4.500)

## 2020-01-02 MED ORDER — VITAMIN D3 25 MCG (1000 UNIT) PO TABS
1000.0000 [IU] | ORAL_TABLET | Freq: Every day | ORAL | Status: DC
  Administered 2020-01-02 – 2020-01-10 (×9): 1000 [IU] via ORAL
  Filled 2020-01-02 (×18): qty 1

## 2020-01-02 MED ORDER — PANTOPRAZOLE SODIUM 40 MG PO TBEC: 40.0000 mg | DELAYED_RELEASE_TABLET | Freq: Every day | ORAL | Status: DC

## 2020-01-02 MED ORDER — FAMOTIDINE 20 MG PO TABS
20.0000 mg | ORAL_TABLET | Freq: Two times a day (BID) | ORAL | Status: AC
  Administered 2020-01-02 – 2020-01-07 (×11): 20 mg via ORAL
  Filled 2020-01-02 (×11): qty 1

## 2020-01-02 MED ORDER — HALOPERIDOL LACTATE 5 MG/ML IJ SOLN: 2.0000 mg | Freq: Four times a day (QID) | INTRAMUSCULAR | Status: DC | PRN

## 2020-01-02 MED ORDER — ALBUTEROL SULFATE HFA 108 (90 BASE) MCG/ACT IN AERS
2.0000 | INHALATION_SPRAY | Freq: Four times a day (QID) | RESPIRATORY_TRACT | Status: DC | PRN
  Filled 2020-01-02: qty 6.7

## 2020-01-02 MED ORDER — MAGNESIUM HYDROXIDE 400 MG/5ML PO SUSP: 30.0000 mL | Freq: Every day | ORAL | Status: DC | PRN

## 2020-01-02 MED ORDER — LORAZEPAM 2 MG/ML IJ SOLN: 1.0000 mg | Freq: Four times a day (QID) | INTRAMUSCULAR | Status: DC | PRN

## 2020-01-02 MED ORDER — OXYCODONE HCL 5 MG PO TABS
5.0000 mg | ORAL_TABLET | Freq: Four times a day (QID) | ORAL | Status: DC | PRN
  Filled 2020-01-02: qty 1

## 2020-01-02 MED ORDER — LORAZEPAM 1 MG PO TABS
1.0000 mg | ORAL_TABLET | Freq: Four times a day (QID) | ORAL | Status: DC | PRN
  Administered 2020-01-02 – 2020-01-08 (×9): 1 mg via ORAL
  Filled 2020-01-02 (×9): qty 1

## 2020-01-02 MED ORDER — OLANZAPINE 5 MG PO TABS
15.0000 mg | ORAL_TABLET | Freq: Every day | ORAL | Status: DC
  Administered 2020-01-02 – 2020-01-03 (×2): 15 mg via ORAL
  Filled 2020-01-02 (×2): qty 1

## 2020-01-02 MED ORDER — ASCORBIC ACID 500 MG PO TABS
500.0000 mg | ORAL_TABLET | Freq: Every day | ORAL | Status: DC
  Administered 2020-01-02 – 2020-01-10 (×9): 500 mg via ORAL
  Filled 2020-01-02 (×9): qty 1

## 2020-01-02 MED ORDER — VITAMIN B-12 1000 MCG PO TABS
1000.0000 ug | ORAL_TABLET | Freq: Every day | ORAL | Status: DC
  Administered 2020-01-02 – 2020-01-10 (×9): 1000 ug via ORAL
  Filled 2020-01-02 (×9): qty 1

## 2020-01-02 MED ORDER — SUCRALFATE 1 G PO TABS
1.0000 g | ORAL_TABLET | Freq: Four times a day (QID) | ORAL | Status: DC
  Administered 2020-01-02 – 2020-01-10 (×31): 1 g via ORAL
  Filled 2020-01-02 (×34): qty 1

## 2020-01-02 MED ORDER — OLANZAPINE 5 MG PO TABS: 15.0000 mg | ORAL_TABLET | Freq: Every day | ORAL | Status: DC

## 2020-01-02 MED ORDER — SENNA 8.6 MG PO TABS
1.0000 | ORAL_TABLET | Freq: Every day | ORAL | Status: DC | PRN
  Filled 2020-01-02: qty 1

## 2020-01-02 MED ORDER — CEPHALEXIN 500 MG PO CAPS
500.0000 mg | ORAL_CAPSULE | Freq: Three times a day (TID) | ORAL | Status: DC
  Administered 2020-01-02 – 2020-01-10 (×24): 500 mg via ORAL
  Filled 2020-01-02 (×24): qty 1

## 2020-01-02 MED ORDER — PROPRANOLOL HCL 20 MG PO TABS
10.0000 mg | ORAL_TABLET | Freq: Two times a day (BID) | ORAL | Status: DC
  Administered 2020-01-02 – 2020-01-09 (×13): 10 mg via ORAL
  Filled 2020-01-02 (×17): qty 1

## 2020-01-02 MED ORDER — LAMOTRIGINE 25 MG PO TABS
50.0000 mg | ORAL_TABLET | Freq: Every day | ORAL | Status: DC
  Administered 2020-01-03 – 2020-01-10 (×8): 50 mg via ORAL
  Filled 2020-01-02 (×8): qty 2

## 2020-01-02 MED ORDER — LEVOTHYROXINE SODIUM 88 MCG PO TABS
88.0000 ug | ORAL_TABLET | Freq: Every day | ORAL | Status: DC
  Administered 2020-01-03 – 2020-01-10 (×8): 88 ug via ORAL
  Filled 2020-01-02 (×8): qty 1

## 2020-01-02 MED ORDER — ALUM & MAG HYDROXIDE-SIMETH 200-200-20 MG/5ML PO SUSP
30.0000 mL | ORAL | Status: DC | PRN
  Administered 2020-01-03 – 2020-01-08 (×7): 30 mL via ORAL
  Filled 2020-01-02 (×8): qty 30

## 2020-01-02 MED ORDER — OXYCODONE HCL 5 MG PO TABS
5.0000 mg | ORAL_TABLET | Freq: Four times a day (QID) | ORAL | Status: DC | PRN
  Administered 2020-01-02 – 2020-01-07 (×8): 5 mg via ORAL
  Filled 2020-01-02 (×8): qty 1

## 2020-01-02 NOTE — BHH Suicide Risk Assessment (Signed)
[facility] Admission Suicide Risk Assessment   Nursing information obtained from:  Patient Demographic factors:  Caucasian, Low socioeconomic status Current Mental Status:  NA Loss Factors:  NA Historical Factors:  NA Risk Reduction Factors:  Sense of responsibility to family  Total Time spent with patient: 30 minutes Principal Problem: <principal problem not specified> Diagnosis:  Active Problems:   MDD (major depressive disorder)  Subjective Data: Patient is seen and examined.  Patient is a 64 year old female with a past psychiatric history significant for bipolar disorder who was brought to the [facility] emergency department on 12/30/2019 by her daughter because "a lot of catastrophic events have occurred".  The patient most recently had lost her son to the penitentiary for 5 years, had had her grandchild taken away from her by the Department of Social Services, and then had not been eating or drinking.  She also was not able to take her medications for an unspecified amount of time.  The patient stated she was last psychiatrically hospitalized in June of this year at our facility.  This at that time was bipolar disorder, mixed, severe.  Her psychiatric medications at discharge included Lamictal 50 mg p.o. daily, Zyprexa 15 mg p.o. nightly.  The patient stated that she had lost about 10 pounds in the last month.  She stated she had not been taking her medicines correctly including her Synthroid.  She admitted to helplessness, hopelessness and worthlessness.  She was to follow-up after discharge from the hospital at [facility] in [place].  Those notes are not available.  Her daughter reported that her primary care provider had recommended that she be brought to the emergency room.  She has a history of Parkinson's disease diagnosed by DAT which is thought to be secondary to previous psychiatric medications he had been taking.  She also has a history of chronic nausea  and vomiting.  She is on chronic narcotic treatment.  According to the PMP database she last received narcotics on 11/01/2019.  She received 180 oxycodone tablets at that time.  It does appear that she has been on that medication at least since 03/10/2019.  In the emergency room she was also found to have a probable urinary tract infection.  She has been given Keflex for that.  Her urine culture has grown additionally E. coli, which is sensitive to is Olen, ceftriaxone, ciprofloxacin, imipenem, nitrofurantoin and piperacillin.  Lamictal level was obtained on 12/10/2019.  At that time it was less than 1 confirming her noncompliance.  She was admitted to the hospital for evaluation and stabilization.  Continued Clinical Symptoms:    The "Alcohol Use Disorders Identification Test", Guidelines for Use in Primary Care, Second Edition.  World Pharmacologist [facility]). Score between 0-7:  no or low risk or alcohol related problems. Score between 8-15:  moderate risk of alcohol related problems. Score between 16-19:  high risk of alcohol related problems. Score 20 or above:  warrants further diagnostic evaluation for alcohol dependence and treatment.   CLINICAL FACTORS:   Bipolar Disorder:   Depressive phase Depression:   Anhedonia Hopelessness Impulsivity Insomnia Chronic Pain Previous Psychiatric Diagnoses and Treatments Medical Diagnoses and Treatments/Surgeries   Musculoskeletal: Strength & Muscle Tone: decreased Gait & Station: shuffle Patient leans: N/A  Psychiatric Specialty Exam: Physical Exam  Nursing note and vitals reviewed. Constitutional: She is oriented to person, place, and time. She appears well-developed.  HENT:  Head: Normocephalic and atraumatic.  Respiratory: Effort normal.  Neurological: She is alert  and oriented to person, place, and time.    Review of Systems  There were no vitals taken for this visit.There is no height or weight on file to calculate BMI.  General  Appearance: Disheveled  Eye Contact:  Minimal  Speech:  Normal Rate  Volume:  Decreased  Mood:  Anxious and Depressed  Affect:  Congruent  Thought Process:  Coherent and Descriptions of Associations: Circumstantial  Orientation:  Full (Time, Place, and Person)  Thought Content:  Logical  Suicidal Thoughts:  No  Homicidal Thoughts:  No  Memory:  Immediate;   Poor Recent;   Poor Remote;   Poor  Judgement:  Impaired  Insight:  Lacking  Psychomotor Activity:  Increased  Concentration:  Concentration: Fair and Attention Span: Fair  Recall:  [employer] of Knowledge:  Fair  Language:  Fair  Akathisia:  Negative  Handed:  Right  AIMS (if indicated):     Assets:  Desire for Improvement Resilience  ADL's:  Impaired  Cognition:  Impaired,  Mild  Sleep:         COGNITIVE FEATURES THAT CONTRIBUTE TO RISK:  None    SUICIDE RISK:   Mild:  Suicidal ideation of limited frequency, intensity, duration, and specificity.  There are no identifiable plans, no associated intent, mild dysphoria and related symptoms, good self-control (both objective and subjective assessment), few other risk factors, and identifiable protective factors, including available and accessible social support.  PLAN OF CARE: Patient is seen and examined.  Patient is a 64 year old female with the above-stated past psychiatric history who was admitted secondary to worsening depression.  She will be admitted to the hospital.  She will be integrated into the milieu.  She will be encouraged to attend groups.  I think a lot of this has to do with noncompliance of her medicines.  At least in the short run we will restart her lamotrigine as well as the olanzapine.  Many of her medications for her medical issues will also be restarted.  Of primary concern is her level thyroxine.  Clearly if she is hypothyroid her depression will be significantly worse.  The last check of her TSH was in August or September this year, and was elevated  at that time.  She also has a history of chronic nausea and vomiting, and it is unclear whether she is on Pepcid 20 mg twice daily or Protonix 40 mg p.o. daily.  Her notes from her primary care provider that are in the chart show that she takes Carafate 4 times a day.  This will be continued with her Pepcid 20 mg twice a day in the short run.  She is also on Inderal I am assuming for her Parkinson's tremor.  That is 10 mg p.o. twice daily that will be continued.  I have ordered a TSH from previously drawn blood, and hopefully that will be available shortly.  Her narcotic prescription most likely ran out, but I am going to give her oxycodone 5 mg p.o. twice daily (again in the short run) to make sure that she she is not undergoing some degree of opiate withdrawal at this point.  If it appears as though her opiates will not be continued I will wean her off of that rapidly.  Social work will contact her daughter and collect additional information.  Besides her urinary tract infection review of her laboratories revealed a mildly elevated glucose at 112.  Her creatinine was normal at 0.75.  Her calcium was mildly  elevated at 11.  Liver function enzymes were normal.  Her CBC was normal.  Her drug screen was completely negative.  I certify that inpatient services furnished can reasonably be expected to improve the patient's condition.   Sharma Covert, MD 01/02/2020, 3:57 PM

## 2020-01-02 NOTE — ED Notes (Signed)
Hourly rounding reveals patient in room. No complaints, stable, in no acute distress. Q15 minute rounds and monitoring via Security Cameras to continue. 

## 2020-01-02 NOTE — BH Assessment (Signed)
Referral Follow Up information for Psychiatric Hospitalization:   [facility] [phone] Patient is currently on the wait list

## 2020-01-02 NOTE — Tx Team (Signed)
Initial Treatment Plan 01/02/2020 6:17 PM Leslie Wong [id number]    PATIENT STRESSORS: Medication change or noncompliance Occupational concerns Substance abuse   PATIENT STRENGTHS: Average or above average intelligence Communication skills General fund of knowledge Motivation for treatment/growth   PATIENT IDENTIFIED PROBLEMS: Deression  Medication non adherence  Self care deficit                 DISCHARGE CRITERIA:  Ability to meet basic life and health needs Adequate post-discharge living arrangements Improved stabilization in mood, thinking, and/or behavior Motivation to continue treatment in a less acute level of care Verbal commitment to aftercare and medication compliance  PRELIMINARY DISCHARGE PLAN: Outpatient therapy Participate in family therapy Placement in alternative living arrangements  PATIENT/FAMILY INVOLVEMENT: This treatment plan has been presented to and reviewed with the patient, Leslie Wong.  The patient has been given the opportunity to ask questions and make suggestions.  Ronelle Nigh, RN 01/02/2020, 6:17 PM

## 2020-01-02 NOTE — Plan of Care (Signed)
Newly admitted and adjusting well. Medication regime initiated. Patient is visible in the milieu, getting along with peers. No major sign of distress noted. Patient received medications and ate dinner. Support and encouragements provided. Safety precautions maintained.

## 2020-01-02 NOTE — BH Assessment (Signed)
Patient is to be admitted to [facility] BMU by Dr. Ainsley Spinner.  Attending Physician will be Dr.  Meredith Pel .   Patient has been assigned to room 303, by [facility] Charge Nurse [place] staff is aware of the admission: Abigail Butts, Patient's Nurse  Gust Rung., Patient Access

## 2020-01-02 NOTE — ED Notes (Signed)
Patient voices understanding of discharge/ admit to BMU, Nurse did report to Nurse that will be resuming care, all belongings transporting with Patient , Patient transferring via w/c with CNA and Security.

## 2020-01-02 NOTE — ED Provider Notes (Signed)
-----------------------------------------   4:38 AM on 01/02/2020 -----------------------------------------   Blood pressure (!) 102/59, pulse 71, temperature 98.6 F (37 C), temperature source Oral, resp. rate 18, height 4\' 11"  (1.499 m), weight 45.4 kg, SpO2 100 %.  The patient had no acute events since last update.  Calm and cooperative at this time.  Disposition is pending per Psychiatry/Behavioral Medicine team recommendations.     Alfred Levins, [place], MD 01/02/20 [phone]

## 2020-01-02 NOTE — H&P (Signed)
Psychiatric Admission Assessment Adult  Patient Identification: Leslie Wong MRN:  [id number] Date of Evaluation:  01/02/2020 Chief Complaint:  MDD (major depressive disorder) [F32.9] Principal Diagnosis: <principal problem not specified> Diagnosis:  Active Problems:   MDD (major depressive disorder)  History of Present Illness: Patient is seen and examined.  Patient is a 64 year old female with a past psychiatric history significant for bipolar disorder who was brought to the [facility] emergency department on 12/30/2019 by her daughter because "a lot of catastrophic events have occurred".  The patient most recently had lost her son to the penitentiary for 5 years, had had her grandchild taken away from her by the [employer], and then had not been eating or drinking.  She also was not able to take her medications for an unspecified amount of time.  The patient stated she was last psychiatrically hospitalized in June of this year at our facility.  This at that time was bipolar disorder, mixed, severe.  Her psychiatric medications at discharge included Lamictal 50 mg p.o. daily, Zyprexa 15 mg p.o. nightly.  The patient stated that she had lost about 10 pounds in the last month.  She stated she had not been taking her medicines correctly including her Synthroid.  She admitted to helplessness, hopelessness and worthlessness.  She was to follow-up after discharge from the hospital at [facility] in [place].  Those notes are not available.  Her daughter reported that her primary care provider had recommended that she be brought to the emergency room.  She has a history of Parkinson's disease diagnosed by DAT which is thought to be secondary to previous psychiatric medications he had been taking.  She also has a history of chronic nausea and vomiting.  She is on chronic narcotic treatment.  According to the PMP database she last received narcotics on  11/01/2019.  She received 180 oxycodone tablets at that time.  It does appear that she has been on that medication at least since 03/10/2019.  In the emergency room she was also found to have a probable urinary tract infection.  She has been given Keflex for that.  Her urine culture has grown additionally E. coli, which is sensitive to is O[place], ceftriaxone, ciprofloxacin, imipenem, nitrofurantoin and piperacillin.  Lamictal level was obtained on 12/10/2019.  At that time it was less than 1 confirming her noncompliance.  She was admitted to the hospital for evaluation and stabilization.  Associated Signs/Symptoms: Depression Symptoms:  depressed mood, anhedonia, insomnia, psychomotor retardation, fatigue, feelings of worthlessness/guilt, difficulty concentrating, hopelessness, suicidal thoughts without plan, anxiety, (Hypo) Manic Symptoms:  Impulsivity, Anxiety Symptoms:  Excessive Worry, Psychotic Symptoms:  Denied PTSD Symptoms: Had a traumatic exposure:  Patient felt that having her grandchild taken away and her son being in prison but was very traumatic for her. Total Time spent with patient: 45 minutes  Past Psychiatric History: Patient has several psychiatric hospitalizations in the past.  Her last psychiatric admission to our facility was in June of this year.  She has previously been diagnosed with opiate dependence on high-dose opiates and other drugs.  Patient has appeared at times have a very difficult time functioning outside of the hospital.  She had just been in the hospital a month prior to the June admission.  She had been in reasonable condition at that time.  Is the patient at risk to self? Yes.    Has the patient been a risk to self in the past 6 months?  No.  Has the patient been a risk to self within the distant past? No.  Is the patient a risk to others? No.  Has the patient been a risk to others in the past 6 months? No.  Has the patient been a risk to others within the  distant past? No.   Prior Inpatient Therapy:   Prior Outpatient Therapy:    Alcohol Screening:   Substance Abuse History in the last 12 months:  No. Consequences of Substance Abuse: Negative Previous Psychotropic Medications: Yes  Psychological Evaluations: Yes  Past Medical History:  Past Medical History:  Diagnosis Date  . Back pain   . Cancer ([place])    skin  . Depression    BAD  . Gastroparesis   . GERD (gastroesophageal reflux disease)   . Hypothyroidism   . Insomnia   . Migraines   . Parkinson disease ([place])    per [facility], dx'd 2019  . Recurrent cold sores   . Shoulder bursitis   . Urge incontinence     Past Surgical History:  Procedure Laterality Date  . ABDOMINAL HYSTERECTOMY  1999   total  . APPENDECTOMY    . ESOPHAGOGASTRODUODENOSCOPY  01/2006   negative except small hiatal hernia  . ESOPHAGOGASTRODUODENOSCOPY  02/24/2015   See report  . LAPAROSCOPY     for endometriosis  . OVARIAN CYST REMOVAL  1990   unilateral  . TONSILLECTOMY AND ADENOIDECTOMY     Family History:  Family History  Problem Relation Age of Onset  . Hypertension Mother   . Arthritis Mother   . Diabetes Mother   . Stroke Mother   . Cancer Father        lung  . Colon cancer Neg Hx   . Breast cancer Neg Hx    Family Psychiatric  History: None reported Tobacco Screening:   Social History:  Social History   Substance and Sexual Activity  Alcohol Use No  . Alcohol/week: 0.0 standard drinks     Social History   Substance and Sexual Activity  Drug Use No    Additional Social History:                           Allergies:   Allergies  Allergen Reactions  . Doxepin Other (See Comments)    Pt reports "that made me have seizures"  . Valproic Acid Shortness Of Breath  . Aspirin     REACTION: UNSPECIFIED  . Atorvastatin Other (See Comments)    Tongue swelling, legs cramping  . Baclofen     REACTION: Throat swells up  . Codeine     REACTION: UNSPECIFIED  .  Duloxetine Other (See Comments)    Vision loss, weight loss  . Erythromycin     REACTION: UNSPECIFIED  . Gabapentin     REACTION: throat swells up  . Metoclopramide Hcl     REACTION: states "messed me up"  . Nortriptyline     Other reaction(s): Other (See Comments) Vision issues  . Nsaids Other (See Comments)    Stomach problems  . Nucynta Er [Tapentadol Hcl Er]     Intolerant, see note from 07/24/12.    Gean Birchwood [Tapentadol]     AMS  . Olanzapine Nausea Only  . Prednisone     GI intolance  . Pregabalin   . Seroquel [Quetiapine Fumerate] Other (See Comments)    Loss control, felt like on another planet  . Sulfa Antibiotics Nausea  And Vomiting  . Topamax Other (See Comments)    Blisters in mouth and tongue  . Vicodin [Hydrocodone-Acetaminophen]     Nausea, thrush and constipation   Lab Results: No results found for this or any previous visit (from the past 42 hour(s)).  Blood Alcohol level:  Lab Results  Component Value Date   ETH <10 12/20/2019   ETH <10 123XX123    Metabolic Disorder Labs:  Lab Results  Component Value Date   HGBA1C 5.4 10/13/2018   MPG 108.28 10/13/2018   MPG 111.15 08/29/2018   No results found for: PROLACTIN Lab Results  Component Value Date   CHOL 168 10/13/2018   TRIG 130 10/13/2018   HDL 39 (L) 10/13/2018   CHOLHDL 4.3 10/13/2018   VLDL 26 10/13/2018   LDLCALC 103 (H) 10/13/2018   LDLCALC 107 (H) 08/29/2018    Current Medications: Current Facility-Administered Medications  Medication Dose Route Frequency Provider Last Rate Last Admin  . albuterol (VENTOLIN HFA) 108 (90 Base) MCG/ACT inhaler 2 puff  2 puff Inhalation Q6H PRN Sharma Covert, MD      . alum & mag hydroxide-simeth (MAALOX/MYLANTA) 200-200-20 MG/5ML suspension 30 mL  30 mL Oral Q4H PRN Sharma Covert, MD      . ascorbic acid (VITAMIN C) tablet 500 mg  500 mg Oral Daily Mallie Darting, Cordie Grice, MD      . cephALEXin [facility]) capsule 500 mg  500 mg Oral Q8H Kariya Lavergne,  Cordie Grice, MD      . cholecalciferol (VITAMIN D) tablet 1,000 Units  1,000 Units Oral Daily Sharma Covert, MD      . famotidine (PEPCID) tablet 20 mg  20 mg Oral BID Sharma Covert, MD      . haloperidol lactate (HALDOL) injection 2 mg  2 mg Intramuscular Q6H PRN Sharma Covert, MD      . Derrill Memo ON 01/03/2020] lamoTRIgine (LAMICTAL) tablet 50 mg  50 mg Oral Daily Sharma Covert, MD      . Derrill Memo ON 01/03/2020] levothyroxine (SYNTHROID) tablet 88 mcg  88 mcg Oral Daily Sharma Covert, MD      . LORazepam (ATIVAN) tablet 1 mg  1 mg Oral Q6H PRN Sharma Covert, MD      . magnesium hydroxide (MILK OF MAGNESIA) suspension 30 mL  30 mL Oral Daily PRN Sharma Covert, MD      . OLANZapine [facility]) tablet 15 mg  15 mg Oral QHS Sharma Covert, MD      . oxyCODONE (Oxy IR/ROXICODONE) immediate release tablet 5 mg  5 mg Oral Q6H PRN Sharma Covert, MD      . propranolol (INDERAL) tablet 10 mg  10 mg Oral BID Sharma Covert, MD      . senna [facility]) tablet 8.6 mg  1 tablet Oral Daily PRN Sharma Covert, MD      . sucralfate (CARAFATE) tablet 1 g  1 g Oral QID Sharma Covert, MD      . vitamin B-12 (CYANOCOBALAMIN) tablet 1,000 mcg  1,000 mcg Oral Daily Mallie Darting Cordie Grice, MD       PTA Medications: Facility-Administered Medications Prior to Admission  Medication Dose Route Frequency Provider Last Rate Last Admin  . NON FORMULARY 5 mL  5 mL Oral QID PRN Tonia Ghent, MD       Medications Prior to Admission  Medication Sig Dispense Refill Last Dose  . albuterol (VENTOLIN HFA) 108 (90 Base) MCG/ACT  inhaler Inhale 2 puffs into the lungs every 6 (six) hours as needed for wheezing or shortness of breath. 18 g 11   . ascorbic acid (VITAMIN C) 500 MG tablet Take 1 tablet (500 mg total) by mouth daily. (Patient not taking: Reported on 08/27/2019) 90 tablet 3   . cephALEXin (KEFLEX) 500 MG capsule Take 1 capsule (500 mg total) by mouth 2 (two) times daily for 7  days. 14 capsule 0   . cholecalciferol (VITAMIN D) 25 MCG (1000 UT) tablet Take 1 tablet (1,000 Units total) by mouth daily. (Patient not taking: Reported on 08/27/2019) 90 tablet 3   . Diphenhyd-Hydrocort-Nystatin (FIRST-DUKES MOUTHWASH) SUSP Use as directed 5 mLs in the mouth or throat 4 (four) times daily as needed. 120 mL 0   . doxepin (SINEQUAN) 25 MG capsule Take 50 mg by mouth at bedtime.     . famotidine (PEPCID) 20 MG tablet Take 1 tablet (20 mg total) by mouth 2 (two) times daily. 60 tablet 0   . fluocinonide cream (LIDEX) [id number] % Apply 1 application topically 2 (two) times daily as needed. 30 g 0   . hydrocerin (EUCERIN) CREA Apply 1 application topically 2 (two) times daily. 454 g 0   . hydrOXYzine (ATARAX/VISTARIL) 50 MG tablet Take 1 tablet (50 mg total) by mouth every 6 (six) hours as needed for anxiety. 12 tablet 0   . lamoTRIgine (LAMICTAL) 25 MG tablet Take 2 tablets (50 mg total) by mouth daily. 60 tablet 1   . levothyroxine (SYNTHROID) 88 MCG tablet TAKE 1 TABLET(88 MCG) BY MOUTH DAILY 90 tablet 1   . lidocaine (XYLOCAINE) 2 % solution Use as directed 15 mLs in the mouth or throat as needed for mouth pain. 100 mL 0   . OLANZapine (ZYPREXA) 15 MG tablet Take 15 mg by mouth daily.     . Omega-3 Fatty Acids (FISH OIL) 1000 MG CAPS Take 1 capsule (1,000 mg total) by mouth daily. (Patient not taking: Reported on 08/27/2019) 90 capsule 3   . omeprazole (PRILOSEC) 40 MG capsule Take 40 mg by mouth 2 (two) times daily.     . ondansetron (ZOFRAN-ODT) 4 MG disintegrating tablet Take 1 tablet (4 mg total) by mouth 3 (three) times daily as needed for nausea or vomiting. 30 tablet 1   . propranolol (INDERAL) 10 MG tablet Take 1 tablet (10 mg total) by mouth 2 (two) times daily. 180 tablet 3   . SENOKOT 8.6 MG tablet Take 2 tablets by mouth daily.     . sucralfate (CARAFATE) 1 g tablet Take 1 tablet (1 g total) by mouth 4 (four) times daily. 120 tablet 1   . tiZANidine (ZANAFLEX) 4 MG tablet Take  1 tablet (4 mg total) by mouth every 8 (eight) hours as needed for muscle spasms. 90 tablet 1   . valACYclovir (VALTREX) 500 MG tablet TAKE 1 TABLET(500 MG) BY MOUTH TWICE DAILY 60 tablet 5   . vitamin B-12 (CYANOCOBALAMIN) 1000 MCG tablet Take 1 tablet (1,000 mcg total) by mouth daily. (Patient not taking: Reported on 08/27/2019) 90 tablet 3   . Vitamin D, Ergocalciferol, (DRISDOL) 1.25 MG (50000 UT) CAPS capsule Take 1 capsule by mouth once a week.       Musculoskeletal: Strength & Muscle Tone: decreased Gait & Station: shuffle Patient leans: N/A  Psychiatric Specialty Exam: Physical Exam  Nursing note and vitals reviewed. Constitutional: She is oriented to person, place, and time. She appears well-developed.  HENT:  Head: Normocephalic  and atraumatic.  Respiratory: Effort normal.  Neurological: She is alert and oriented to person, place, and time.    Review of Systems  There were no vitals taken for this visit.There is no height or weight on file to calculate BMI.  General Appearance: Disheveled  Eye Contact:  Minimal  Speech:  Normal Rate  Volume:  Decreased  Mood:  Anxious and Depressed  Affect:  Blunt  Thought Process:  Goal Directed and Descriptions of Associations: Circumstantial  Orientation:  Full (Time, Place, and Person)  Thought Content:  Logical  Suicidal Thoughts:  No  Homicidal Thoughts:  No  Memory:  Immediate;   Poor Recent;   Poor Remote;   Poor  Judgement:  Impaired  Insight:  Lacking  Psychomotor Activity:  Increased  Concentration:  Concentration: Fair and Attention Span: Fair  Recall:  [employer] of Knowledge:  Fair  Language:  Fair  Akathisia:  Negative  Handed:  Right  AIMS (if indicated):     Assets:  Desire for Improvement Resilience  ADL's:  Impaired  Cognition:  Impaired,  Mild  Sleep:       Treatment Plan Summary: Daily contact with patient to assess and evaluate symptoms and progress in treatment, Medication management and Plan :  Patient is seen and examined.  Patient is a 64 year old female with the above-stated past psychiatric history who was admitted secondary to worsening depression.  She will be admitted to the hospital.  She will be integrated into the milieu.  She will be encouraged to attend groups.  I think a lot of this has to do with noncompliance of her medicines.  At least in the short run we will restart her lamotrigine as well as the olanzapine.  Many of her medications for her medical issues will also be restarted.  Of primary concern is her level thyroxine.  Clearly if she is hypothyroid her depression will be significantly worse.  The last check of her TSH was in August or September this year, and was elevated at that time.  She also has a history of chronic nausea and vomiting, and it is unclear whether she is on Pepcid 20 mg twice daily or Protonix 40 mg p.o. daily.  Her notes from her primary care provider that are in the chart show that she takes Carafate 4 times a day.  This will be continued with her Pepcid 20 mg twice a day in the short run.  She is also on Inderal I am assuming for her Parkinson's tremor.  That is 10 mg p.o. twice daily that will be continued.  I have ordered a TSH from previously drawn blood, and hopefully that will be available shortly.  Her narcotic prescription most likely ran out, but I am going to give her oxycodone 5 mg p.o. twice daily (again in the short run) to make sure that she she is not undergoing some degree of opiate withdrawal at this point.  If it appears as though her opiates will not be continued I will wean her off of that rapidly.  Social work will contact her daughter and collect additional information.  Besides her urinary tract infection review of her laboratories revealed a mildly elevated glucose at 112.  Her creatinine was normal at 0.75.  Her calcium was mildly elevated at 11.  Liver function enzymes were normal.  Her CBC was normal.  Her drug screen was completely  negative.  Observation Level/Precautions:  15 minute checks  Laboratory:  Chemistry Profile  Psychotherapy:    Medications:    Consultations:    Discharge Concerns:    Estimated LOS:  Other:     Physician Treatment Plan for Primary Diagnosis: <principal problem not specified> Long Term Goal(s): Improvement in symptoms so as ready for discharge  Short Term Goals: Ability to identify changes in lifestyle to reduce recurrence of condition will improve, Ability to verbalize feelings will improve, Ability to disclose and discuss suicidal ideas, Ability to demonstrate self-control will improve, Ability to identify and develop effective coping behaviors will improve, Ability to maintain clinical measurements within normal limits will improve and Compliance with prescribed medications will improve  Physician Treatment Plan for Secondary Diagnosis: Active Problems:   MDD (major depressive disorder)  Long Term Goal(s): Improvement in symptoms so as ready for discharge  Short Term Goals: Ability to identify changes in lifestyle to reduce recurrence of condition will improve, Ability to verbalize feelings will improve, Ability to disclose and discuss suicidal ideas, Ability to demonstrate self-control will improve, Ability to identify and develop effective coping behaviors will improve, Ability to maintain clinical measurements within normal limits will improve and Compliance with prescribed medications will improve  I certify that inpatient services furnished can reasonably be expected to improve the patient's condition.    Sharma Covert, MD 1/7/20214:12 PM

## 2020-01-02 NOTE — Progress Notes (Signed)
Patient ID: Leslie Wong, female   DOB: Oct 25, 1956, 64 y.o.   MRN: [id number] Patient presents with increased depression, hopelessness and helplessness. Reports history of Bipolar and depression and has not been taking medications for a while.  Reports that her depression got worse ever since her son was sent to jail. In addition, her grand baby was taken away by [employer]. Patient has been neglecting herself, not eating, not engaging in the activities that  she used to like. She reports that she currently lives alone but her daughter and sister check on her. She denies suicidal thoughts. She presents with poor appetite, poor hygiene and generalized weakness. Skin is dry and flaky. No bruising or swelling. She is willing to work on her depression, self care and medication compliance. Patient mentioned her desire to go to  an assisted living upon discharge.  Patient was admitted and oriented to the unit. Safety precautions  initiated. Psychiatric evaluation completed and medication regime initiated.

## 2020-01-02 NOTE — ED Notes (Signed)
Patient is awake, up to nursing station to throw away trash, patient does not know where her dentures are, nurse will order soft diet , patient without any behavioral issues, will continue to monitor.

## 2020-01-02 NOTE — ED Notes (Signed)
Patient ate 75% of lunch and beverage, no signs of distress, she had soft foods for lunch that was ordered from menu, Patient is cooperative, and pleasant, q 15 minute checks and camera surveillance in progress for safety.

## 2020-01-02 NOTE — ED Notes (Signed)
Patient is alert and oriented, no signs of distress, will continue to monitor.

## 2020-01-03 ENCOUNTER — Ambulatory Visit: Payer: Self-pay | Admitting: *Deleted

## 2020-01-03 ENCOUNTER — Telehealth: Payer: Self-pay | Admitting: Gastroenterology

## 2020-01-03 LAB — LIPID PANEL
Cholesterol: 182 mg/dL (ref 0–200)
HDL: 33 mg/dL — ABNORMAL LOW (ref >40)
LDL Cholesterol: 93 mg/dL (ref 0–99)
Total CHOL/HDL Ratio: 5.5 RATIO
Triglycerides: 278 mg/dL — ABNORMAL HIGH (ref <150)
VLDL: 56 mg/dL — ABNORMAL HIGH (ref 0–40)

## 2020-01-03 MED ORDER — ENSURE ENLIVE PO LIQD
237.0000 mL | Freq: Three times a day (TID) | ORAL | Status: DC
  Administered 2020-01-03 – 2020-01-09 (×21): 237 mL via ORAL

## 2020-01-03 MED ORDER — ADULT MULTIVITAMIN W/MINERALS CH
1.0000 | ORAL_TABLET | Freq: Every day | ORAL | Status: DC
  Administered 2020-01-04 – 2020-01-10 (×7): 1 via ORAL
  Filled 2020-01-03 (×7): qty 1
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
## 2020-01-03 NOTE — Plan of Care (Signed)
Patient is in & out of her room.Stated that she is feeling little better today.Denies SI,HI and AVH.Compliant with medications.Attended groups.Appropriate with staff & peers.Support and encouragement given.

## 2020-01-03 NOTE — Progress Notes (Signed)
[facility] MD Progress Note  01/03/2020 10:11 AM Kayal Stebleton  MRN:  [id number] Subjective: Patient is a 64 year old female with a past psychiatric history significant for bipolar disorder who was brought to the [facility] emergency department on 12/30/2019 by her daughter because "a lot of catastrophic events have occurred".  Objective: Patient is seen and examined.  Patient is a 64 year old female with the above-stated past psychiatric history seen in follow-up.  She looks a lot better today.  She is up in the day room and eating breakfast.  It looks like she is eating well this morning.  Her blood pressure this morning is 119/82.  Pulse is regular at 78 and she is a mild fever at 99.3.  She slept 7.5 hours.  She admitted that she felt much better, and denied major complaint this morning.  She denied suicidal ideation.  She did state this morning that her daughter wanted her to attempt to get into an assisted living facility.  I have forwarded that information to social work.  Review of her laboratories from admission were done on admission.  The the only additional laboratory from her earlier drawn blood work revealed that her TSH was 4.327.  4 months ago it was 2.476.  She continues on the Keflex for her urinary tract infection.  She did sleep 7.5 hours.  I am concerned that much of her rapid improvement may be related to the opiates that I restarted.  We will have to get some collateral history with regard to those medications.  Principal Problem: <principal problem not specified> Diagnosis: Active Problems:   MDD (major depressive disorder)  Total Time spent with patient: 20 minutes  Past Psychiatric History: See admission H&P  Past Medical History:  Past Medical History:  Diagnosis Date  . Back pain   . Cancer ([place])    skin  . Depression    BAD  . Gastroparesis   . GERD (gastroesophageal reflux disease)   . Hypothyroidism   . Insomnia   . Migraines   .  Parkinson disease ([place])    per [facility], dx'd 2019  . Recurrent cold sores   . Shoulder bursitis   . Urge incontinence     Past Surgical History:  Procedure Laterality Date  . ABDOMINAL HYSTERECTOMY  1999   total  . APPENDECTOMY    . ESOPHAGOGASTRODUODENOSCOPY  01/2006   negative except small hiatal hernia  . ESOPHAGOGASTRODUODENOSCOPY  02/24/2015   See report  . LAPAROSCOPY     for endometriosis  . OVARIAN CYST REMOVAL  1990   unilateral  . TONSILLECTOMY AND ADENOIDECTOMY     Family History:  Family History  Problem Relation Age of Onset  . Hypertension Mother   . Arthritis Mother   . Diabetes Mother   . Stroke Mother   . Cancer Father        lung  . Colon cancer Neg Hx   . Breast cancer Neg Hx    Family Psychiatric  History: See admission H&P Social History:  Social History   Substance and Sexual Activity  Alcohol Use No  . Alcohol/week: 0.0 standard drinks     Social History   Substance and Sexual Activity  Drug Use No    Social History   Socioeconomic History  . Marital status: Divorced    Spouse name: Not on file  . Number of children: Not on file  . Years of education: Not on file  . Highest education level: Not  on file  Occupational History  . Not on file  Tobacco Use  . Smoking status: Current Some Day Smoker    Packs/day: 1.00    Years: 42.50    Pack years: 42.50    Types: Cigarettes  . Smokeless tobacco: Former Systems developer  . Tobacco comment: declined  Substance and Sexual Activity  . Alcohol use: No    Alcohol/week: 0.0 standard drinks  . Drug use: No  . Sexual activity: Never  Other Topics Concern  . Not on file  Social History Narrative   Lives alone.     Has a dog names Dixie   Social Determinants of Health   Financial Resource Strain:   . Difficulty of Paying Living Expenses: Not on file  Food Insecurity:   . Worried About Charity fundraiser in the Last Year: Not on file  . Ran Out of Food in the Last Year: Not on file   Transportation Needs:   . Lack of Transportation (Medical): Not on file  . Lack of Transportation (Non-Medical): Not on file  Physical Activity:   . Days of Exercise per Week: Not on file  . Minutes of Exercise per Session: Not on file  Stress:   . Feeling of Stress : Not on file  Social Connections:   . Frequency of Communication with Friends and Family: Not on file  . Frequency of Social Gatherings with Friends and Family: Not on file  . Attends Religious Services: Not on file  . Active Member of Clubs or Organizations: Not on file  . Attends Archivist Meetings: Not on file  . Marital Status: Not on file   Additional Social History:                         Sleep: Fair  Appetite:  Good  Current Medications: Current Facility-Administered Medications  Medication Dose Route Frequency Provider Last Rate Last Admin  . albuterol (VENTOLIN HFA) 108 (90 Base) MCG/ACT inhaler 2 puff  2 puff Inhalation Q6H PRN Sharma Covert, MD      . alum & mag hydroxide-simeth (MAALOX/MYLANTA) 200-200-20 MG/5ML suspension 30 mL  30 mL Oral Q4H PRN Sharma Covert, MD      . ascorbic acid (VITAMIN C) tablet 500 mg  500 mg Oral Daily Sharma Covert, MD   500 mg at 01/03/20 0910  . cephALEXin (KEFLEX) capsule 500 mg  500 mg Oral Q8H Sharma Covert, MD   500 mg at 01/03/20 0911  . cholecalciferol (VITAMIN D) tablet 1,000 Units  1,000 Units Oral Daily Sharma Covert, MD   1,000 Units at 01/03/20 0911  . famotidine (PEPCID) tablet 20 mg  20 mg Oral BID Sharma Covert, MD   20 mg at 01/03/20 0910  . haloperidol lactate (HALDOL) injection 2 mg  2 mg Intramuscular Q6H PRN Sharma Covert, MD      . lamoTRIgine (LAMICTAL) tablet 50 mg  50 mg Oral Daily Sharma Covert, MD   50 mg at 01/03/20 0911  . levothyroxine (SYNTHROID) tablet 88 mcg  88 mcg Oral Daily Sharma Covert, MD   88 mcg at 01/03/20 [id number]  . LORazepam (ATIVAN) tablet 1 mg  1 mg Oral Q6H PRN  Sharma Covert, MD   1 mg at 01/02/20 2220  . magnesium hydroxide (MILK OF MAGNESIA) suspension 30 mL  30 mL Oral Daily PRN Sharma Covert, MD      .  OLANZapine (ZYPREXA) tablet 15 mg  15 mg Oral QHS Sharma Covert, MD   15 mg at 01/02/20 2220  . oxyCODONE (Oxy IR/ROXICODONE) immediate release tablet 5 mg  5 mg Oral Q6H PRN Sharma Covert, MD      . oxyCODONE (Oxy IR/ROXICODONE) immediate release tablet 5 mg  5 mg Oral Q6H PRN Cristofano, Dorene Ar, MD   5 mg at 01/02/20 2304  . propranolol (INDERAL) tablet 10 mg  10 mg Oral BID Sharma Covert, MD   10 mg at 01/03/20 0910  . senna (SENOKOT) tablet 8.6 mg  1 tablet Oral Daily PRN Sharma Covert, MD      . sucralfate (CARAFATE) tablet 1 g  1 g Oral QID Sharma Covert, MD   1 g at 01/03/20 0915  . vitamin B-12 (CYANOCOBALAMIN) tablet 1,000 mcg  1,000 mcg Oral Daily Sharma Covert, MD   1,000 mcg at 01/03/20 [id number]    Lab Results:  Results for orders placed or performed during the hospital encounter of 01/02/20 (from the past 48 hour(s))  Hepatic function panel     Status: Abnormal   Collection Time: 01/02/20  4:21 PM  Result Value Ref Range   Total Protein 6.3 (L) 6.5 - 8.1 g/dL   Albumin 3.9 3.5 - 5.0 g/dL   AST 18 15 - 41 U/L   ALT 12 0 - 44 U/L   Alkaline Phosphatase 98 38 - 126 U/L   Total Bilirubin 0.3 0.3 - 1.2 mg/dL   Bilirubin, Direct <0.1 0.0 - 0.2 mg/dL   Indirect Bilirubin NOT CALCULATED 0.3 - 0.9 mg/dL    Comment: Performed at [facility], [address] [place], [place] [address]  TSH     Status: None   Collection Time: 01/02/20  4:21 PM  Result Value Ref Range   TSH 4.327 0.350 - 4.500 uIU/mL    Comment: Performed by a 3rd Generation assay with a functional sensitivity of <=0.01 uIU/mL. Performed at [facility], [address] [place], [place] [address]   Lipid panel     Status: Abnormal   Collection Time: 01/03/20  8:19 AM  Result Value Ref Range   Cholesterol 182 0 -  200 mg/dL   Triglycerides 278 (H) <150 mg/dL   HDL 33 (L) >40 mg/dL   Total CHOL/HDL Ratio 5.5 RATIO   VLDL 56 (H) 0 - 40 mg/dL   LDL Cholesterol 93 0 - 99 mg/dL    Comment:        Total Cholesterol/HDL:CHD Risk Coronary Heart Disease Risk Table                     Men   Women  1/2 Average Risk   3.4   3.3  Average Risk       5.0   4.4  2 X Average Risk   9.6   7.1  3 X Average Risk  23.4   11.0        Use the calculated Patient Ratio above and the CHD Risk Table to determine the patient's CHD Risk.        ATP III CLASSIFICATION (LDL):  <100     mg/dL   Optimal  100-129  mg/dL   Near or Above                    Optimal  130-159  mg/dL   Borderline  160-189  mg/dL   High  >  190     mg/dL   Very High Performed at [facility], [address] [place], [place] [address]     Blood Alcohol level:  Lab Results  Component Value Date   [facility] <10 12/20/2019   ETH <10 [id number]    Metabolic Disorder Labs: Lab Results  Component Value Date   HGBA1C 5.4 10/13/2018   MPG 108.28 10/13/2018   MPG 111.15 08/29/2018   No results found for: PROLACTIN Lab Results  Component Value Date   CHOL 182 01/03/2020   TRIG 278 (H) 01/03/2020   HDL 33 (L) 01/03/2020   CHOLHDL 5.5 01/03/2020   VLDL 56 (H) 01/03/2020   LDLCALC 93 01/03/2020   LDLCALC 103 (H) 10/13/2018    Physical Findings: AIMS:  , ,  ,  ,    CIWA:    COWS:     Musculoskeletal: Strength & Muscle Tone: within normal limits Gait & Station: shuffle Patient leans: N/A  Psychiatric Specialty Exam: Physical Exam  Nursing note and vitals reviewed. Constitutional: She is oriented to person, place, and time. She appears well-developed.  HENT:  Head: Normocephalic and atraumatic.  Respiratory: Effort normal.  Neurological: She is alert and oriented to person, place, and time.    Review of Systems  Blood pressure 119/82, pulse 78, temperature 99.3 F (37.4 C), temperature source Oral, resp. rate 17, height  4\' 11"  (1.499 m), weight 42.2 kg, SpO2 97 %.Body mass index is 18.78 kg/m.  General Appearance: Disheveled  Eye Contact:  Fair  Speech:  Normal Rate  Volume:  Normal  Mood:  Euthymic  Affect:  Congruent  Thought Process:  Coherent and Descriptions of Associations: Circumstantial  Orientation:  Full (Time, Place, and Person)  Thought Content:  Logical  Suicidal Thoughts:  No  Homicidal Thoughts:  No  Memory:  Immediate;   Fair Recent;   Fair Remote;   Fair  Judgement:  Impaired  Insight:  Fair  Psychomotor Activity:  Normal  Concentration:  Concentration: Fair and Attention Span: Fair  Recall:  [employer] of Knowledge:  Fair  Language:  Fair  Akathisia:  Negative  Handed:  Right  AIMS (if indicated):     Assets:  Desire for Improvement Resilience  ADL's:  Intact  Cognition:  WNL  Sleep:  Number of Hours: 7.5     Treatment Plan Summary: Daily contact with patient to assess and evaluate symptoms and progress in treatment, Medication management and Plan : Patient is seen and examined.  Patient is a 64 year old female with the above-stated past psychiatric history who is seen in follow-up.   Diagnosis: #1 bipolar disorder, most recently depressed, #2 urinary tract infection, #3 hypothyroidism, #4 chronic pain, #5 Parkinson's disease, #6 constipation, #7 GERD plus or minus gastric erosion, #8 COPD  Patient is seen in follow-up.  She looks a lot better today.  She is eating well, not moving around.  She slept well.  I know she was noncompliant with her medications, and that clearly could have helped her respond quickly.  Additionally her last narcotic prescription was from November, and I think restarting the opiates may be beneficial to her.  We will try and get some collateral information on her.  If she continues to respond as well as she has in the first 24 hours we may be able to get her home on Monday. 1.  Continue albuterol HFA 2 puffs every 6 hours as needed wheezing. 2.   Continue Keflex 500 mg p.o. every  8 hours for urinary tract infection. 3.  Continue vitamin D at 1000 units p.o. daily for vitamin D deficiency. 4.  Continue Pepcid 20 mg p.o. twice daily for GERD. 5.  Continue Lamictal 50 mg p.o. daily for mood stability. 6.  Continue levothyroxine 88 mcg p.o. daily for hypothyroidism. 7.  Continue lorazepam 1 mg p.o. every 6 hours as needed a CIWA greater than 10. 8.  Continue Zyprexa 15 mg p.o. nightly for mood stability. 9.  Continue oxycodone 5 mg p.o. every 6 hours as needed pain. 10.  Continue propranolol 10 mg p.o. twice daily for hypertension and tremor. 11.  Continue senna 8.6 mg p.o. daily as needed constipation. 12.  Continue Carafate 1 g p.o. 4 times daily for gastric erosions. 13.  Continue cyanocobalamin at 1000 mcg p.o. daily for nutritional supplementation. 14.  Disposition planning-in progress.   Sharma Covert, MD 01/03/2020, 10:11 AM

## 2020-01-03 NOTE — BHH Group Notes (Signed)
LCSW Group Therapy Note  01/03/2020 1:00 PM  Type of Therapy and Topic:  Group Therapy:  Feelings around Relapse and Recovery  Participation Level:  Did Not Attend   Description of Group:    Patients in this group will discuss emotions they experience before and after a relapse. They will process how experiencing these feelings, or avoidance of experiencing them, relates to having a relapse. Facilitator will guide patients to explore emotions they have related to recovery. Patients will be encouraged to process which emotions are more powerful. They will be guided to discuss the emotional reaction significant others in their lives may have to their relapse or recovery. Patients will be assisted in exploring ways to respond to the emotions of others without this contributing to a relapse.  Therapeutic Goals: 1. Patient will identify two or more emotions that lead to a relapse for them 2. Patient will identify two emotions that result when they relapse 3. Patient will identify two emotions related to recovery 4. Patient will demonstrate ability to communicate their needs through discussion and/or role plays   Summary of Patient Progress:     Therapeutic Modalities:   Cognitive Behavioral Therapy Solution-Focused Therapy Assertiveness Training Relapse Prevention Therapy   Assunta Curtis, MSW, LCSW 01/03/2020 12:25 PM

## 2020-01-03 NOTE — BHH Suicide Risk Assessment (Signed)
[facility] INPATIENT:  Family/Significant Other Suicide Prevention Education  Suicide Prevention Education:  Patient Refusal for Family/Significant Other Suicide Prevention Education: The patient Leslie Wong has refused to provide written consent for family/significant other to be provided Family/Significant Other Suicide Prevention Education during admission and/or prior to discharge.  Physician notified.  SPE completed with pt, as pt refused to consent to family contact. SPI pamphlet provided to pt and pt was encouraged to share information with support network, ask questions, and talk about any concerns relating to SPE. Pt denies access to guns/firearms and verbalized understanding of information provided. Mobile Crisis information also provided to pt.   [place] MSW LCSW 01/03/2020, 9:58 AM

## 2020-01-03 NOTE — Progress Notes (Signed)
Initial Nutrition Assessment  DOCUMENTATION CODES:   Not applicable  INTERVENTION:   Ensure Enlive po TID, each supplement provides 350 kcal and 20 grams of protein  Magic cup TID with meals, each supplement provides 290 kcal and 9 grams of protein  MVI daily   NUTRITION DIAGNOSIS:   Inadequate oral intake related to social / environmental circumstances as evidenced by per patient/family report.  GOAL:   Patient will meet greater than or equal to 90% of their needs  MONITOR:   PO intake, Supplement acceptance  REASON FOR ASSESSMENT:   Malnutrition Screening Tool    ASSESSMENT:   64 year old female with h/o bipolar disorder, chronic N/V (scheduled for EGD 01/15/20) and reflux who was brought to the [facility] emergency department on 12/30/2019 by her daughter for concerns of "a lot of catastrophic events have occurred".   Per chart review, pt reports poor appetite and oral intake pta. Pt with improved appetite and oral intake in hospital; pt eating 100% of meals. RD will add supplements and MVI to help pt meet her estimated needs. Pt noted to have chronic nausea and vomiting; it appears she is being worked up for this as an outpatient. Per chart, pt appears to be down 7lbs(7%) from her UBW; RD unsure how recently weight loss occurred and over what time period.   Pt at high risk for malnutrition  Medications reviewed and include: vitamin C, cephalexin, D3, pepcid, sucralfate, B12  Labs reviewed:   Diet Order:   Diet Order            Diet regular Room service appropriate? Yes; Fluid consistency: Thin  Diet effective now             EDUCATION NEEDS:   No education needs have been identified at this time  Skin:  Skin Assessment: Reviewed RN Assessment  Last BM:  unknown  Height:   Ht Readings from Last 1 Encounters:  01/02/20 4\' 11"  (1.499 m)    Weight:   Wt Readings from Last 1 Encounters:  01/02/20 42.2 kg    Ideal Body Weight:   44.5 kg  BMI:  Body mass index is 18.78 kg/m.  Estimated Nutritional Needs:   Kcal:  1300-1500kcal/day  Protein:  65-75g/day  Fluid:  >1.3L/day  Koleen Distance MS, RD, LDN Pager #- [phone] Office#- [phone] After Hours Pager: [phone]

## 2020-01-03 NOTE — Progress Notes (Signed)
Recreation Therapy Notes   Date: 01/03/2020  Time: 9:30 am  Location: Craft room  Behavioral response: Appropriate  Intervention Topic: Happiness    Discussion/Intervention:  Group content today was focused on Happiness. The group defined happiness and described where happiness comes from. Individuals identified what makes them happy and how they go about making others happy. Patients expressed things that stop them from being happy and ways they can improve their happiness. The group stated reasons why it is important to be happy. The group participated in the intervention "My Happiness", where they had a chance to identify and express things that make them happy. Clinical Observations/Feedback:  Patient came to group and was focused on what peers and staff had to say about happiness. Individual participated in the intervention during group and was social with peers and staff. Gurley Climer LRT/CTRS         Brithany Whitworth 01/03/2020 11:13 AM

## 2020-01-03 NOTE — Telephone Encounter (Signed)
Left vm to offer Ed/ F/u apt with Dr. Marius Ditch

## 2020-01-03 NOTE — Progress Notes (Signed)
Recreation Therapy Notes  INPATIENT RECREATION THERAPY ASSESSMENT  Patient Details Name: Jahna Mcclenathan MRN: [id number] DOB: 06/23/1956 Today's Date: 01/03/2020       Information Obtained From: Patient  Able to Participate in Assessment/Interview: Yes  Patient Presentation: Responsive  Reason for Admission (Per Patient): Active Symptoms, Med Non-Compliance, Other (Comments)(Depression)  Patient Stressors:    Coping Skills:   Talk  Leisure Interests (2+):  Social - Family, Music - Listen  Frequency of Recreation/Participation: Monthly  Awareness of Community Resources:     Community Resources:     Current Use:    If no, Barriers?:    Expressed Interest in [place] of Residence:  Insurance underwriter  Patient Main Form of Transportation: Other (Comment)(My daughter)  Patient Strengths:  N/A  Patient Identified Areas of Improvement:  N/A  Patient Goal for Hospitalization:  To get well and put my weight back on and take my meds  Current SI (including self-harm):  No  Current HI:  No  Current AVH: No  Staff Intervention Plan: Group Attendance, Collaborate with Interdisciplinary Treatment Team  Consent to Intern Participation: N/A  Markia Kyer 01/03/2020, 2:52 PM

## 2020-01-03 NOTE — Progress Notes (Signed)
Patient alert and oriented x 3 with period of confusion to situations, during interaction it was noted she was [facility] to her left ear. Patient's thoughts are organized and coherent, sheis interacting with peer and staff appropriately, she appears less anxious, she was making appropriate eye contact , receptive to staff, took her scheduled evening medication and she attended evening wrap up group. nok distress noted,, 15 minutes safety checks maintained will continue to closely monitor.

## 2020-01-04 MED ORDER — OLANZAPINE 10 MG PO TABS
20.0000 mg | ORAL_TABLET | Freq: Every day | ORAL | Status: DC
  Administered 2020-01-04 – 2020-01-09 (×6): 20 mg via ORAL
  Filled 2020-01-04 (×6): qty 2

## 2020-01-04 NOTE — Progress Notes (Signed)
Patient presents with sad affect but brightens on approach. Denies Si, HI, AVH. Endorses depression. Patient states she has done enough damage to her body without suicide. Patient wanting to go to assisted living. Complains of generalized pain. PRN given with good relief.Patient eating and drinking with ensure supplements as well. Visible in milieu at times, takes rest breaks frequently. Encouragement and support provided. Medications given as prescribed. Patient receptive and remains safe on unit with q15 min checks.

## 2020-01-04 NOTE — Tx Team (Signed)
Interdisciplinary Treatment and Diagnostic Plan Update  01/04/2020 Time of Session: 09:30am Leslie Wong MRN: [id number]  Principal Diagnosis: <principal problem not specified>  Secondary Diagnoses: Active Problems:   MDD (major depressive disorder)   Current Medications:  Current Facility-Administered Medications  Medication Dose Route Frequency Provider Last Rate Last Admin  . albuterol (VENTOLIN HFA) 108 (90 Base) MCG/ACT inhaler 2 puff  2 puff Inhalation Q6H PRN Sharma Covert, MD      . alum & mag hydroxide-simeth (MAALOX/MYLANTA) 200-200-20 MG/5ML suspension 30 mL  30 mL Oral Q4H PRN Sharma Covert, MD   30 mL at 01/04/20 0100  . ascorbic acid (VITAMIN C) tablet 500 mg  500 mg Oral Daily Sharma Covert, MD   500 mg at 01/04/20 0925  . cephALEXin (KEFLEX) capsule 500 mg  500 mg Oral Q8H Sharma Covert, MD   500 mg at 01/04/20 0925  . cholecalciferol (VITAMIN D) tablet 1,000 Units  1,000 Units Oral Daily Sharma Covert, MD   1,000 Units at 01/04/20 0925  . famotidine (PEPCID) tablet 20 mg  20 mg Oral BID Sharma Covert, MD   20 mg at 01/04/20 0925  . feeding supplement (ENSURE ENLIVE) (ENSURE ENLIVE) liquid 237 mL  237 mL Oral TID BM Cristofano, Paul A, MD   237 mL at 01/04/20 0929  . haloperidol lactate (HALDOL) injection 2 mg  2 mg Intramuscular Q6H PRN Sharma Covert, MD      . lamoTRIgine (LAMICTAL) tablet 50 mg  50 mg Oral Daily Sharma Covert, MD   50 mg at 01/04/20 0925  . levothyroxine (SYNTHROID) tablet 88 mcg  88 mcg Oral Daily Sharma Covert, MD   88 mcg at 01/04/20 [id number]  . LORazepam (ATIVAN) tablet 1 mg  1 mg Oral Q6H PRN Sharma Covert, MD   1 mg at 01/03/20 2152  . magnesium hydroxide (MILK OF MAGNESIA) suspension 30 mL  30 mL Oral Daily PRN Sharma Covert, MD      . multivitamin with minerals tablet 1 tablet  1 tablet Oral Daily Cristofano, Dorene Ar, MD   1 tablet at 01/04/20 0924  . OLANZapine (ZYPREXA) tablet 20 mg   20 mg Oral QHS Sharma Covert, MD      . oxyCODONE (Oxy IR/ROXICODONE) immediate release tablet 5 mg  5 mg Oral Q6H PRN Cristofano, Dorene Ar, MD   5 mg at 01/04/20 0925  . propranolol (INDERAL) tablet 10 mg  10 mg Oral BID Sharma Covert, MD   10 mg at 01/04/20 0925  . senna (SENOKOT) tablet 8.6 mg  1 tablet Oral Daily PRN Sharma Covert, MD      . sucralfate (CARAFATE) tablet 1 g  1 g Oral QID Sharma Covert, MD   1 g at 01/04/20 0926  . vitamin B-12 (CYANOCOBALAMIN) tablet 1,000 mcg  1,000 mcg Oral Daily Sharma Covert, MD   1,000 mcg at 01/04/20 [id number]   PTA Medications: Facility-Administered Medications Prior to Admission  Medication Dose Route Frequency Provider Last Rate Last Admin  . NON FORMULARY 5 mL  5 mL Oral QID PRN Tonia Ghent, MD       Medications Prior to Admission  Medication Sig Dispense Refill Last Dose  . albuterol (VENTOLIN HFA) 108 (90 Base) MCG/ACT inhaler Inhale 2 puffs into the lungs every 6 (six) hours as needed for wheezing or shortness of breath. 18 g 11   . ascorbic acid (VITAMIN C)  500 MG tablet Take 1 tablet (500 mg total) by mouth daily. (Patient not taking: Reported on 08/27/2019) 90 tablet 3   . cephALEXin (KEFLEX) 500 MG capsule Take 1 capsule (500 mg total) by mouth 2 (two) times daily for 7 days. 14 capsule 0   . cholecalciferol (VITAMIN D) 25 MCG (1000 UT) tablet Take 1 tablet (1,000 Units total) by mouth daily. (Patient not taking: Reported on 08/27/2019) 90 tablet 3   . Diphenhyd-Hydrocort-Nystatin (FIRST-DUKES MOUTHWASH) SUSP Use as directed 5 mLs in the mouth or throat 4 (four) times daily as needed. 120 mL 0   . doxepin (SINEQUAN) 25 MG capsule Take 50 mg by mouth at bedtime.     . famotidine (PEPCID) 20 MG tablet Take 1 tablet (20 mg total) by mouth 2 (two) times daily. 60 tablet 0   . fluocinonide cream (LIDEX) [id number] % Apply 1 application topically 2 (two) times daily as needed. 30 g 0   . hydrocerin (EUCERIN) CREA Apply 1 application  topically 2 (two) times daily. 454 g 0   . hydrOXYzine (ATARAX/VISTARIL) 50 MG tablet Take 1 tablet (50 mg total) by mouth every 6 (six) hours as needed for anxiety. 12 tablet 0   . lamoTRIgine (LAMICTAL) 25 MG tablet Take 2 tablets (50 mg total) by mouth daily. 60 tablet 1   . levothyroxine (SYNTHROID) 88 MCG tablet TAKE 1 TABLET(88 MCG) BY MOUTH DAILY 90 tablet 1   . lidocaine (XYLOCAINE) 2 % solution Use as directed 15 mLs in the mouth or throat as needed for mouth pain. 100 mL 0   . OLANZapine (ZYPREXA) 15 MG tablet Take 15 mg by mouth daily.     . Omega-3 Fatty Acids (FISH OIL) 1000 MG CAPS Take 1 capsule (1,000 mg total) by mouth daily. (Patient not taking: Reported on 08/27/2019) 90 capsule 3   . omeprazole (PRILOSEC) 40 MG capsule Take 40 mg by mouth 2 (two) times daily.     . ondansetron (ZOFRAN-ODT) 4 MG disintegrating tablet Take 1 tablet (4 mg total) by mouth 3 (three) times daily as needed for nausea or vomiting. 30 tablet 1   . propranolol (INDERAL) 10 MG tablet Take 1 tablet (10 mg total) by mouth 2 (two) times daily. 180 tablet 3   . SENOKOT 8.6 MG tablet Take 2 tablets by mouth daily.     . sucralfate (CARAFATE) 1 g tablet Take 1 tablet (1 g total) by mouth 4 (four) times daily. 120 tablet 1   . tiZANidine (ZANAFLEX) 4 MG tablet Take 1 tablet (4 mg total) by mouth every 8 (eight) hours as needed for muscle spasms. 90 tablet 1   . valACYclovir (VALTREX) 500 MG tablet TAKE 1 TABLET(500 MG) BY MOUTH TWICE DAILY 60 tablet 5   . vitamin B-12 (CYANOCOBALAMIN) 1000 MCG tablet Take 1 tablet (1,000 mcg total) by mouth daily. (Patient not taking: Reported on 08/27/2019) 90 tablet 3   . Vitamin D, Ergocalciferol, (DRISDOL) 1.25 MG (50000 UT) CAPS capsule Take 1 capsule by mouth once a week.       Patient Stressors: Medication change or noncompliance Occupational concerns Substance abuse  Patient Strengths: Average or above average intelligence Communication skills General fund of  knowledge Motivation for treatment/growth  Treatment Modalities: Medication Management, Group therapy, Case management,  1 to 1 session with clinician, Psychoeducation, Recreational therapy.   Physician Treatment Plan for Primary Diagnosis: <principal problem not specified> Long Term Goal(s): Improvement in symptoms so as ready for discharge Improvement in  symptoms so as ready for discharge   Short Term Goals: Ability to identify changes in lifestyle to reduce recurrence of condition will improve Ability to verbalize feelings will improve Ability to disclose and discuss suicidal ideas Ability to demonstrate self-control will improve Ability to identify and develop effective coping behaviors will improve Ability to maintain clinical measurements within normal limits will improve Compliance with prescribed medications will improve Ability to identify changes in lifestyle to reduce recurrence of condition will improve Ability to verbalize feelings will improve Ability to disclose and discuss suicidal ideas Ability to demonstrate self-control will improve Ability to identify and develop effective coping behaviors will improve Ability to maintain clinical measurements within normal limits will improve Compliance with prescribed medications will improve  Medication Management: Evaluate patient's response, side effects, and tolerance of medication regimen.  Therapeutic Interventions: 1 to 1 sessions, Unit Group sessions and Medication administration.  Evaluation of Outcomes: Progressing  Physician Treatment Plan for Secondary Diagnosis: Active Problems:   MDD (major depressive disorder)  Long Term Goal(s): Improvement in symptoms so as ready for discharge Improvement in symptoms so as ready for discharge   Short Term Goals: Ability to identify changes in lifestyle to reduce recurrence of condition will improve Ability to verbalize feelings will improve Ability to disclose and discuss  suicidal ideas Ability to demonstrate self-control will improve Ability to identify and develop effective coping behaviors will improve Ability to maintain clinical measurements within normal limits will improve Compliance with prescribed medications will improve Ability to identify changes in lifestyle to reduce recurrence of condition will improve Ability to verbalize feelings will improve Ability to disclose and discuss suicidal ideas Ability to demonstrate self-control will improve Ability to identify and develop effective coping behaviors will improve Ability to maintain clinical measurements within normal limits will improve Compliance with prescribed medications will improve     Medication Management: Evaluate patient's response, side effects, and tolerance of medication regimen.  Therapeutic Interventions: 1 to 1 sessions, Unit Group sessions and Medication administration.  Evaluation of Outcomes: Progressing   RN Treatment Plan for Primary Diagnosis: <principal problem not specified> Long Term Goal(s): Knowledge of disease and therapeutic regimen to maintain health will improve  Short Term Goals: Ability to participate in decision making will improve, Ability to verbalize feelings will improve, Ability to disclose and discuss suicidal ideas, Ability to identify and develop effective coping behaviors will improve and Compliance with prescribed medications will improve  Medication Management: RN will administer medications as ordered by provider, will assess and evaluate patient's response and provide education to patient for prescribed medication. RN will report any adverse and/or side effects to prescribing provider.  Therapeutic Interventions: 1 on 1 counseling sessions, Psychoeducation, Medication administration, Evaluate responses to treatment, Monitor vital signs and CBGs as ordered, Perform/monitor CIWA, COWS, AIMS and Fall Risk screenings as ordered, Perform wound care  treatments as ordered.  Evaluation of Outcomes: Progressing   LCSW Treatment Plan for Primary Diagnosis: <principal problem not specified> Long Term Goal(s): Safe transition to appropriate next level of care at discharge, Engage patient in therapeutic group addressing interpersonal concerns.  Short Term Goals: Engage patient in aftercare planning with referrals and resources and Increase skills for wellness and recovery  Therapeutic Interventions: Assess for all discharge needs, 1 to 1 time with Social worker, Explore available resources and support systems, Assess for adequacy in community support network, Educate family and significant other(s) on suicide prevention, Complete Psychosocial Assessment, Interpersonal group therapy.  Evaluation of Outcomes: Progressing   Progress  in Treatment: Attending groups: No. Participating in groups: No. Taking medication as prescribed: Yes. Toleration medication: Yes. Family/Significant other contact made: No, will contact:  SPE with pt Patient understands diagnosis: No. Discussing patient identified problems/goals with staff: Yes. Medical problems stabilized or resolved: Yes. Denies suicidal/homicidal ideation: Yes. Issues/concerns per patient self-inventory: No. Other:   New problem(s) identified: No, Describe:  None  New Short Term/Long Term Goal(s):Marland Kitchen Medication stabilization, elimination of SI thoughts, and development of a comprehensive mental wellness plan.   Patient Goals:  "I want to get better and go home and take care of myself better"   Discharge Plan or Barriers: Patient and pt's daughter want the patient to go to an assisted living facility which is not going to happen. Patient will most likely be discharged home and will follow up with [facility] Dr. Orland Mustard.   Reason for Continuation of Hospitalization: Anxiety Depression Medication stabilization  Estimated Length of Stay: 2-3 days   Attendees: Patient: Leslie Wong 01/04/2020    Physician: Dr. Myles Lipps, MD 01/04/2020   Nursing: Marcie Bal, RN  01/04/2020  RN Care Manager: 01/04/2020   Social Worker: Ardelle Anton, LCSW  01/04/2020   Recreational Therapist:  01/04/2020   Other:  01/04/2020   Other:  01/04/2020   Other: 01/04/2020     Scribe for Treatment Team: Trecia Rogers, LCSW 01/04/2020 10:52 AM

## 2020-01-04 NOTE — Progress Notes (Signed)
[facility] MD Progress Note  01/04/2020 10:07 AM Leslie Wong  MRN:  [id number] Subjective:  Patient is a 64 year old female with a past psychiatric history significant for bipolar disorder who was brought to the [facility] emergency department on 12/30/2019 by her daughter because "a lot of catastrophic events have occurred".  Objective: Patient is seen and examined.  Patient was also seen today by treatment team.  Patient is a 64 year old female with the above-stated past psychiatric history who is seen in follow-up.  She continues to slowly improve.  She is still focused on the "catastrophe" of the loss of her mother and father, her son being in prison, and [employer] taking away her grandchild.  She stated this morning that her abdomen and stomach are hurting a bit, but better than on admission.  Her blood pressure has a slight diastolic elevation of [id number].  Pulse is 81.  She is afebrile.  She only slept 4.25 hours last night.  She denied any suicidal ideation.  She denied any homicidal ideation.  She denied any auditory or visual hallucinations.  Her lipid panel from 1/8 showed elevated triglycerides at 278.  Principal Problem: <principal problem not specified> Diagnosis: Active Problems:   MDD (major depressive disorder)  Total Time spent with patient: 20 minutes  Past Psychiatric History: See admission H&P  Past Medical History:  Past Medical History:  Diagnosis Date  . Back pain   . Cancer ([place])    skin  . Depression    BAD  . Gastroparesis   . GERD (gastroesophageal reflux disease)   . Hypothyroidism   . Insomnia   . Migraines   . Parkinson disease ([place])    per [facility], dx'd 2019  . Recurrent cold sores   . Shoulder bursitis   . Urge incontinence     Past Surgical History:  Procedure Laterality Date  . ABDOMINAL HYSTERECTOMY  1999   total  . APPENDECTOMY    . ESOPHAGOGASTRODUODENOSCOPY  01/2006   negative except small hiatal hernia  .  ESOPHAGOGASTRODUODENOSCOPY  02/24/2015   See report  . LAPAROSCOPY     for endometriosis  . OVARIAN CYST REMOVAL  1990   unilateral  . TONSILLECTOMY AND ADENOIDECTOMY     Family History:  Family History  Problem Relation Age of Onset  . Hypertension Mother   . Arthritis Mother   . Diabetes Mother   . Stroke Mother   . Cancer Father        lung  . Colon cancer Neg Hx   . Breast cancer Neg Hx    Family Psychiatric  History: See admission H&P Social History:  Social History   Substance and Sexual Activity  Alcohol Use No  . Alcohol/week: 0.0 standard drinks     Social History   Substance and Sexual Activity  Drug Use No    Social History   Socioeconomic History  . Marital status: Divorced    Spouse name: Not on file  . Number of children: Not on file  . Years of education: Not on file  . Highest education level: Not on file  Occupational History  . Not on file  Tobacco Use  . Smoking status: Current Some Day Smoker    Packs/day: 1.00    Years: 42.50    Pack years: 42.50    Types: Cigarettes  . Smokeless tobacco: Former Systems developer  . Tobacco comment: declined  Substance and Sexual Activity  . Alcohol use: No    Alcohol/week:  0.0 standard drinks  . Drug use: No  . Sexual activity: Never  Other Topics Concern  . Not on file  Social History Narrative   Lives alone.     Has a dog names Dixie   Social Determinants of Health   Financial Resource Strain:   . Difficulty of Paying Living Expenses: Not on file  Food Insecurity:   . Worried About Charity fundraiser in the Last Year: Not on file  . Ran Out of Food in the Last Year: Not on file  Transportation Needs:   . Lack of Transportation (Medical): Not on file  . Lack of Transportation (Non-Medical): Not on file  Physical Activity:   . Days of Exercise per Week: Not on file  . Minutes of Exercise per Session: Not on file  Stress:   . Feeling of Stress : Not on file  Social Connections:   . Frequency of  Communication with Friends and Family: Not on file  . Frequency of Social Gatherings with Friends and Family: Not on file  . Attends Religious Services: Not on file  . Active Member of Clubs or Organizations: Not on file  . Attends Archivist Meetings: Not on file  . Marital Status: Not on file   Additional Social History:                         Sleep: Poor  Appetite:  Poor  Current Medications: Current Facility-Administered Medications  Medication Dose Route Frequency Provider Last Rate Last Admin  . albuterol (VENTOLIN HFA) 108 (90 Base) MCG/ACT inhaler 2 puff  2 puff Inhalation Q6H PRN Sharma Covert, MD      . alum & mag hydroxide-simeth (MAALOX/MYLANTA) 200-200-20 MG/5ML suspension 30 mL  30 mL Oral Q4H PRN Sharma Covert, MD   30 mL at 01/04/20 0100  . ascorbic acid (VITAMIN C) tablet 500 mg  500 mg Oral Daily Sharma Covert, MD   500 mg at 01/04/20 0925  . cephALEXin (KEFLEX) capsule 500 mg  500 mg Oral Q8H Sharma Covert, MD   500 mg at 01/04/20 0925  . cholecalciferol (VITAMIN D) tablet 1,000 Units  1,000 Units Oral Daily Sharma Covert, MD   1,000 Units at 01/04/20 0925  . famotidine (PEPCID) tablet 20 mg  20 mg Oral BID Sharma Covert, MD   20 mg at 01/04/20 0925  . feeding supplement (ENSURE ENLIVE) (ENSURE ENLIVE) liquid 237 mL  237 mL Oral TID BM Cristofano, Paul A, MD   237 mL at 01/04/20 0929  . haloperidol lactate (HALDOL) injection 2 mg  2 mg Intramuscular Q6H PRN Sharma Covert, MD      . lamoTRIgine (LAMICTAL) tablet 50 mg  50 mg Oral Daily Sharma Covert, MD   50 mg at 01/04/20 0925  . levothyroxine (SYNTHROID) tablet 88 mcg  88 mcg Oral Daily Sharma Covert, MD   88 mcg at 01/04/20 [id number]  . LORazepam (ATIVAN) tablet 1 mg  1 mg Oral Q6H PRN Sharma Covert, MD   1 mg at 01/03/20 2152  . magnesium hydroxide (MILK OF MAGNESIA) suspension 30 mL  30 mL Oral Daily PRN Sharma Covert, MD      . multivitamin  with minerals tablet 1 tablet  1 tablet Oral Daily Cristofano, Dorene Ar, MD   1 tablet at 01/04/20 0924  . OLANZapine (ZYPREXA) tablet 15 mg  15 mg Oral QHS Keyah Blizard,  Cordie Grice, MD   15 mg at 01/03/20 2152  . oxyCODONE (Oxy IR/ROXICODONE) immediate release tablet 5 mg  5 mg Oral Q6H PRN Cristofano, Dorene Ar, MD   5 mg at 01/04/20 0925  . propranolol (INDERAL) tablet 10 mg  10 mg Oral BID Sharma Covert, MD   10 mg at 01/04/20 0925  . senna (SENOKOT) tablet 8.6 mg  1 tablet Oral Daily PRN Sharma Covert, MD      . sucralfate (CARAFATE) tablet 1 g  1 g Oral QID Sharma Covert, MD   1 g at 01/04/20 0926  . vitamin B-12 (CYANOCOBALAMIN) tablet 1,000 mcg  1,000 mcg Oral Daily Sharma Covert, MD   1,000 mcg at 01/04/20 [id number]    Lab Results:  Results for orders placed or performed during the hospital encounter of 01/02/20 (from the past 48 hour(s))  Hepatic function panel     Status: Abnormal   Collection Time: 01/02/20  4:21 PM  Result Value Ref Range   Total Protein 6.3 (L) 6.5 - 8.1 g/dL   Albumin 3.9 3.5 - 5.0 g/dL   AST 18 15 - 41 U/L   ALT 12 0 - 44 U/L   Alkaline Phosphatase 98 38 - 126 U/L   Total Bilirubin 0.3 0.3 - 1.2 mg/dL   Bilirubin, Direct <0.1 0.0 - 0.2 mg/dL   Indirect Bilirubin NOT CALCULATED 0.3 - 0.9 mg/dL    Comment: Performed at [facility], [address] [place], [place] [address]  TSH     Status: None   Collection Time: 01/02/20  4:21 PM  Result Value Ref Range   TSH 4.327 0.350 - 4.500 uIU/mL    Comment: Performed by a 3rd Generation assay with a functional sensitivity of <=0.01 uIU/mL. Performed at [facility], [address] [place], [place] [address]   Lipid panel     Status: Abnormal   Collection Time: 01/03/20  8:19 AM  Result Value Ref Range   Cholesterol 182 0 - 200 mg/dL   Triglycerides 278 (H) <150 mg/dL   HDL 33 (L) >40 mg/dL   Total CHOL/HDL Ratio 5.5 RATIO   VLDL 56 (H) 0 - 40 mg/dL   LDL Cholesterol 93 0 - 99  mg/dL    Comment:        Total Cholesterol/HDL:CHD Risk Coronary Heart Disease Risk Table                     Men   Women  1/2 Average Risk   3.4   3.3  Average Risk       5.0   4.4  2 X Average Risk   9.6   7.1  3 X Average Risk  23.4   11.0        Use the calculated Patient Ratio above and the CHD Risk Table to determine the patient's CHD Risk.        ATP III CLASSIFICATION (LDL):  <100     mg/dL   Optimal  100-129  mg/dL   Near or Above                    Optimal  130-159  mg/dL   Borderline  160-189  mg/dL   High  >190     mg/dL   Very High Performed at [facility], [address] [place], [place] [address]     Blood Alcohol level:  Lab Results  Component  Value Date   [facility] <10 12/20/2019   ETH <10 [id number]    Metabolic Disorder Labs: Lab Results  Component Value Date   HGBA1C 5.4 10/13/2018   MPG 108.28 10/13/2018   MPG 111.15 08/29/2018   No results found for: PROLACTIN Lab Results  Component Value Date   CHOL 182 01/03/2020   TRIG 278 (H) 01/03/2020   HDL 33 (L) 01/03/2020   CHOLHDL 5.5 01/03/2020   VLDL 56 (H) 01/03/2020   LDLCALC 93 01/03/2020   LDLCALC 103 (H) 10/13/2018    Physical Findings: AIMS:  , ,  ,  ,    CIWA:    COWS:     Musculoskeletal: Strength & Muscle Tone: decreased Gait & Station: shuffle Patient leans: N/A  Psychiatric Specialty Exam: Physical Exam  Nursing note and vitals reviewed. Constitutional: She is oriented to person, place, and time. She appears well-developed.  HENT:  Head: Normocephalic and atraumatic.  Respiratory: Effort normal.  Neurological: She is alert and oriented to person, place, and time.    Review of Systems  Blood pressure (!) 125/91, pulse 81, temperature 98.6 F (37 C), temperature source Oral, resp. rate 18, height 4\' 11"  (1.499 m), weight 42.2 kg, SpO2 94 %.Body mass index is 18.78 kg/m.  General Appearance: Disheveled  Eye Contact:  Fair  Speech:  Normal Rate  Volume:  Normal   Mood:  Anxious  Affect:  Congruent  Thought Process:  Coherent and Descriptions of Associations: Intact  Orientation:  Full (Time, Place, and Person)  Thought Content:  Logical  Suicidal Thoughts:  No  Homicidal Thoughts:  No  Memory:  Immediate;   Fair Recent;   Fair Remote;   Fair  Judgement:  Intact  Insight:  Fair  Psychomotor Activity:  Increased  Concentration:  Concentration: Fair and Attention Span: Fair  Recall:  [employer] of Knowledge:  Fair  Language:  Fair  Akathisia:  Negative  Handed:  Right  AIMS (if indicated):     Assets:  Desire for Improvement Resilience  ADL's:  Intact  Cognition:  Impaired,  Mild  Sleep:  Number of Hours: 4.25     Treatment Plan Summary: Daily contact with patient to assess and evaluate symptoms and progress in treatment, Medication management and Plan : Patient is seen and examined.  Patient is a 64 year old female with the above-stated past psychiatric history who is seen in follow-up.  Diagnosis: #1 bipolar disorder, most recently depressed, #2 urinary tract infection, #3 hypothyroidism, #4 chronic pain, #5 Parkinson's disease, #6 constipation, #7 GERD plus or minus gastric erosion, #8 COPD  Patient is seen in follow-up.  She is slightly better than yesterday.  She did not sleep well last night.  I will go on and increase her olanzapine up to 20 mg p.o. nightly to see if that will help.  She also stated that she was on the Carafate and proton pump inhibitors for reflux.  I will go on and overlap Protonix with the Pepcid today, and then get rid of the Pepcid tomorrow.  No other changes in her medicines at this time.  As stated previously her urine culture grew up E. coli which is resistant to ampicillin, amp sulbactam, gentamicin and Bactrim.  She continues on Keflex for this.  No other changes in her medications at this point.  Her daughter did call yesterday, and I called her back and left a message.  The patient is stated that her  daughter would like her to go to  assisted living, and I will forward her request to the social workers, but I think it is unlikely to be able to get her into an assisted living facility from psychiatry.  1.  Continue albuterol HFA 2 puffs every 6 hours as needed wheezing. 2.  Continue vitamin C 500 mg p.o. daily for nutritional supplementation. 3.  Continue Keflex 500 mg p.o. every 8 hours for urinary tract infection. 4.  Continue vitamin D 1000 units p.o. daily for vitamin D deficiency. 5.  Continue Pepcid 20 mg p.o. twice daily for reflux disease. 6.  Continue Haldol 2 mg IM every 6 hours as needed agitation. 7.  Continue Lamictal 50 mg p.o. daily for mood stability. 8.  Continue levothyroxine 88 mcg p.o. daily for hypothyroidism. 9.  Continue lorazepam 1 mg p.o. every 6 hours as needed a CIWA greater than 10. 10.  Increase Zyprexa to 20 mg p.o. nightly for mood stability, sleep and appetite stimulation. 11.  Continue multivitamin with minerals 1 tablet p.o. daily for nutritional supplementation. 12.  Continue propranolol 10 mg p.o. twice daily for hypertension as well as tremor. 13.  Continue Senokot 1 tablet p.o. daily as needed constipation. 14.  Continue Carafate 1 g p.o. 4 times daily for gastric erosion. 15.  Add Protonix 40 mg p.o. daily for reflux disease.  We will overlap with Pepcid for 1 to 2 days, and then stop the Pepcid. 16.  Continue vitamin B12 1000 mcg p.o. daily for nutritional supplementation. 17.  Disposition planning-in progress. Sharma Covert, MD 01/04/2020, 10:07 AM

## 2020-01-04 NOTE — Plan of Care (Signed)
  Problem: Education: Goal: Mental status will improve Outcome: Progressing   Problem: Coping: Goal: Ability to verbalize frustrations and anger appropriately will improve Outcome: Progressing Goal: Ability to demonstrate self-control will improve Outcome: Progressing   Problem: Health Behavior/Discharge Planning: Goal: Compliance with treatment plan for underlying cause of condition will improve Outcome: Progressing   Problem: Education: Goal: Emotional status will improve Outcome: Not Progressing

## 2020-01-04 NOTE — Progress Notes (Signed)
Patient alert and oriented x 4 her thoughts are organized and coherent, she is interacting with peers and staff appropriately, she appears less anxious, receptive to staff, took her scheduled evening medication and she attended evening wrap up group. nok distress noted,, 15 minutes safety checks maintained will continue to closely monitor.

## 2020-01-05 MED ORDER — BUPROPION HCL 75 MG PO TABS
75.0000 mg | ORAL_TABLET | Freq: Every day | ORAL | Status: DC
  Administered 2020-01-05 – 2020-01-10 (×6): 75 mg via ORAL
  Filled 2020-01-05 (×6): qty 1

## 2020-01-05 MED ORDER — PANTOPRAZOLE SODIUM 40 MG PO TBEC
40.0000 mg | DELAYED_RELEASE_TABLET | Freq: Every day | ORAL | Status: DC
  Administered 2020-01-05 – 2020-01-10 (×6): 40 mg via ORAL
  Filled 2020-01-05 (×6): qty 1

## 2020-01-05 NOTE — [facility] Group Notes (Signed)
LCSW Aftercare Discharge Planning Group Note  01/05/2020   Type of Group and Topic: Psychoeducational Group: Discharge Planning  Participation Level: Did Not Attend  Description of Group  Discharge planning group reviews patient's anticipated discharge plans and assists patients to anticipate and address any barriers to wellness/recovery in the community. Suicide prevention education is reviewed with patients in group.  Therapeutic Goals  1. Patients will state their anticipated discharge plan and mental health aftercare  2. Patients will identify potential barriers to wellness in the community setting  3. Patients will engage in problem solving, solution focused discussion of ways to anticipate and address barriers to wellness/recovery  Summary of Patient Progress  Plan for Discharge/Comments:  Transportation Means:  Supports:  Therapeutic Modalities:  [place], [place]  01/05/2020 1:39 PM

## 2020-01-05 NOTE — Progress Notes (Signed)
[facility] MD Progress Note  01/05/2020 10:21 AM Leslie Wong  MRN:  [id number] Subjective:  Patient is a 64 year old female with a past psychiatric history significant for bipolar disorder who was brought to the [facility] emergency department on 12/30/2019 by her daughter because "a lot of catastrophic events have occurred".  Objective: Patient is seen and examined.  Patient is a 64 year old female with the above-stated past psychiatric history who is seen in follow-up.  She is essentially unchanged from yesterday.  She wants to know why she has to take all these medicines.  She continues to complain of depressive symptoms.  We discussed the fact of her history of noncompliance, and what we would have to do to help her with that.  She denied any suicidal ideation.  She denied any auditory or visual hallucinations.  Her vital signs are stable, she is afebrile.  Her sleep was improved last night to 7.75 hours.  No new laboratories.  Principal Problem: <principal problem not specified> Diagnosis: Active Problems:   MDD (major depressive disorder)  Total Time spent with patient: 20 minutes  Past Psychiatric History: See admission H&P  Past Medical History:  Past Medical History:  Diagnosis Date  . Back pain   . Cancer ([place])    skin  . Depression    BAD  . Gastroparesis   . GERD (gastroesophageal reflux disease)   . Hypothyroidism   . Insomnia   . Migraines   . Parkinson disease ([place])    per [place] [facility], dx'd 2019  . Recurrent cold sores   . Shoulder bursitis   . Urge incontinence     Past Surgical History:  Procedure Laterality Date  . ABDOMINAL HYSTERECTOMY  1999   total  . APPENDECTOMY    . ESOPHAGOGASTRODUODENOSCOPY  01/2006   negative except small hiatal hernia  . ESOPHAGOGASTRODUODENOSCOPY  02/24/2015   See report  . LAPAROSCOPY     for endometriosis  . OVARIAN CYST REMOVAL  1990   unilateral  . TONSILLECTOMY AND ADENOIDECTOMY     Family  History:  Family History  Problem Relation Age of Onset  . Hypertension Mother   . Arthritis Mother   . Diabetes Mother   . Stroke Mother   . Cancer Father        lung  . Colon cancer Neg Hx   . Breast cancer Neg Hx    Family Psychiatric  History: See admission H&P Social History:  Social History   Substance and Sexual Activity  Alcohol Use No  . Alcohol/week: 0.0 standard drinks     Social History   Substance and Sexual Activity  Drug Use No    Social History   Socioeconomic History  . Marital status: Divorced    Spouse name: Not on file  . Number of children: Not on file  . Years of education: Not on file  . Highest education level: Not on file  Occupational History  . Not on file  Tobacco Use  . Smoking status: Current Some Day Smoker    Packs/day: 1.00    Years: 42.50    Pack years: 42.50    Types: Cigarettes  . Smokeless tobacco: Former Systems developer  . Tobacco comment: declined  Substance and Sexual Activity  . Alcohol use: No    Alcohol/week: 0.0 standard drinks  . Drug use: No  . Sexual activity: Never  Other Topics Concern  . Not on file  Social History Narrative   Lives alone.  Has a dog names Dixie   Social Determinants of Health   Financial Resource Strain:   . Difficulty of Paying Living Expenses: Not on file  Food Insecurity:   . Worried About Charity fundraiser in the Last Year: Not on file  . Ran Out of Food in the Last Year: Not on file  Transportation Needs:   . Lack of Transportation (Medical): Not on file  . Lack of Transportation (Non-Medical): Not on file  Physical Activity:   . Days of Exercise per Week: Not on file  . Minutes of Exercise per Session: Not on file  Stress:   . Feeling of Stress : Not on file  Social Connections:   . Frequency of Communication with Friends and Family: Not on file  . Frequency of Social Gatherings with Friends and Family: Not on file  . Attends Religious Services: Not on file  . Active Member of  Clubs or Organizations: Not on file  . Attends Archivist Meetings: Not on file  . Marital Status: Not on file   Additional Social History:                         Sleep: Good  Appetite:  Fair  Current Medications: Current Facility-Administered Medications  Medication Dose Route Frequency Provider Last Rate Last Admin  . albuterol (VENTOLIN HFA) 108 (90 Base) MCG/ACT inhaler 2 puff  2 puff Inhalation Q6H PRN Sharma Covert, MD      . alum & mag hydroxide-simeth (MAALOX/MYLANTA) 200-200-20 MG/5ML suspension 30 mL  30 mL Oral Q4H PRN Sharma Covert, MD   30 mL at 01/04/20 1417  . ascorbic acid (VITAMIN C) tablet 500 mg  500 mg Oral Daily Sharma Covert, MD   500 mg at 01/05/20 0805  . cephALEXin (KEFLEX) capsule 500 mg  500 mg Oral Q8H Sharma Covert, MD   500 mg at 01/05/20 0805  . cholecalciferol (VITAMIN D) tablet 1,000 Units  1,000 Units Oral Daily Sharma Covert, MD   1,000 Units at 01/05/20 0805  . famotidine (PEPCID) tablet 20 mg  20 mg Oral BID Sharma Covert, MD   20 mg at 01/05/20 0805  . feeding supplement (ENSURE ENLIVE) (ENSURE ENLIVE) liquid 237 mL  237 mL Oral TID BM Cristofano, Paul A, MD   237 mL at 01/05/20 0806  . haloperidol lactate (HALDOL) injection 2 mg  2 mg Intramuscular Q6H PRN Sharma Covert, MD      . lamoTRIgine (LAMICTAL) tablet 50 mg  50 mg Oral Daily Sharma Covert, MD   50 mg at 01/05/20 0805  . levothyroxine (SYNTHROID) tablet 88 mcg  88 mcg Oral Daily Sharma Covert, MD   88 mcg at 01/05/20 [phone]  . LORazepam (ATIVAN) tablet 1 mg  1 mg Oral Q6H PRN Sharma Covert, MD   1 mg at 01/05/20 0811  . magnesium hydroxide (MILK OF MAGNESIA) suspension 30 mL  30 mL Oral Daily PRN Sharma Covert, MD      . multivitamin with minerals tablet 1 tablet  1 tablet Oral Daily Cristofano, Dorene Ar, MD   1 tablet at 01/05/20 0806  . OLANZapine (ZYPREXA) tablet 20 mg  20 mg Oral QHS Sharma Covert, MD   20 mg at  01/04/20 2118  . oxyCODONE (Oxy IR/ROXICODONE) immediate release tablet 5 mg  5 mg Oral Q6H PRN Cristofano, Dorene Ar, MD   5 mg  at 01/05/20 0805  . propranolol (INDERAL) tablet 10 mg  10 mg Oral BID Sharma Covert, MD   10 mg at 01/05/20 0805  . senna (SENOKOT) tablet 8.6 mg  1 tablet Oral Daily PRN Sharma Covert, MD      . sucralfate (CARAFATE) tablet 1 g  1 g Oral QID Sharma Covert, MD   1 g at 01/05/20 0805  . vitamin B-12 (CYANOCOBALAMIN) tablet 1,000 mcg  1,000 mcg Oral Daily Sharma Covert, MD   1,000 mcg at 01/05/20 0805    Lab Results: No results found for this or any previous visit (from the past 70 hour(s)).  Blood Alcohol level:  Lab Results  Component Value Date   ETH <10 12/20/2019   ETH <10 123XX123    Metabolic Disorder Labs: Lab Results  Component Value Date   HGBA1C 5.4 10/13/2018   MPG 108.28 10/13/2018   MPG 111.15 08/29/2018   No results found for: PROLACTIN Lab Results  Component Value Date   CHOL 182 01/03/2020   TRIG 278 (H) 01/03/2020   HDL 33 (L) 01/03/2020   CHOLHDL 5.5 01/03/2020   VLDL 56 (H) 01/03/2020   LDLCALC 93 01/03/2020   LDLCALC 103 (H) 10/13/2018    Physical Findings: AIMS:  , ,  ,  ,    CIWA:    COWS:     Musculoskeletal: Strength & Muscle Tone: within normal limits Gait & Station: shuffle Patient leans: N/A  Psychiatric Specialty Exam: Physical Exam  Nursing note reviewed. Constitutional: She is oriented to person, place, and time. She appears well-developed.  HENT:  Head: Normocephalic and atraumatic.  Respiratory: Effort normal.  Neurological: She is alert and oriented to person, place, and time.    Review of Systems  Blood pressure 99/80, pulse 74, temperature 98.6 F (37 C), temperature source Oral, resp. rate 18, height 4\' 11"  (1.499 m), weight 42.2 kg, SpO2 100 %.Body mass index is 18.78 kg/m.  General Appearance: Disheveled  Eye Contact:  Fair  Speech:  Normal Rate  Volume:  Normal  Mood:   Anxious and Depressed  Affect:  Congruent  Thought Process:  Coherent and Descriptions of Associations: Circumstantial  Orientation:  Full (Time, Place, and Person)  Thought Content:  Logical  Suicidal Thoughts:  No  Homicidal Thoughts:  No  Memory:  Immediate;   Fair Recent;   Fair Remote;   Fair  Judgement:  Intact  Insight:  Fair  Psychomotor Activity:  Increased  Concentration:  Concentration: Fair and Attention Span: Fair  Recall:  [employer] of Knowledge:  Fair  Language:  Good  Akathisia:  Negative  Handed:  Right  AIMS (if indicated):     Assets:  Desire for Improvement Housing Resilience Social Support  ADL's:  Intact  Cognition:  WNL  Sleep:  Number of Hours: 7.75     Treatment Plan Summary: Daily contact with patient to assess and evaluate symptoms and progress in treatment, Medication management and Plan : Patient is seen and examined.  Patient is a 64 year old female with the above-stated past psychiatric history who is seen in follow-up.  Diagnosis: #1 bipolar disorder, most recently depressed, #2 urinary tract infection, #3 hypothyroidism, #4 chronic pain, #5 Parkinson's disease, #6 constipation, #7 GERD plus or minus gastric erosion, #8 COPD  Patient is seen in follow-up.  She is essentially unchanged from yesterday.  I reviewed her last 3 admissions, and during those hospitalizations and also her outpatient visits in 2020 the  only antidepressant medication I can see that she had taken was from a note on 08/15/2019 where she was on Wellbutrin short acting 75 mg p.o. daily.  The rest of her medications were basically as they are now.  On her 3 hospitalizations here during 2020 she apparently improved on her combination of Lamictal, trazodone and Zyprexa.  Also in reviewing these notes it appears that sometime in the last several months patient's daughter attempted to get guardianship on her.  I am unsure whether or not that ever took place.  Also I am not sure why  they stopped the Wellbutrin.  I am going to go on and restart that at 75 mg p.o. daily.  If she appears to be getting manic we will stop that.  I inadvertently forgot to start the Protonix yesterday, but I will do that today, and then stop the Pepcid tomorrow.  She continues on the Keflex for her urinary tract infection.  Otherwise no other changes in her medications today. 1.  Continue albuterol HFA 2 puffs every 6 hours as needed wheezing. 2.  Continue vitamin C 500 mg p.o. daily for nutritional supplementation. 3.  Continue Keflex 500 mg p.o. every 8 hours for urinary tract infection. 4.  Continue vitamin D 1000 units p.o. daily for vitamin D deficiency. 5.  Continue Pepcid 20 mg p.o. twice daily for reflux disease. 6.  Continue Haldol 2 mg IM every 6 hours as needed agitation. 7.  Continue Lamictal 50 mg p.o. daily for mood stability. 8.  Continue levothyroxine 88 mcg p.o. daily for hypothyroidism. 9.  Continue lorazepam 1 mg p.o. every 6 hours as needed a CIWA greater than 10. 10.  Increase Zyprexa to 20 mg p.o. nightly for mood stability, sleep and appetite stimulation. 11.  Continue multivitamin with minerals 1 tablet p.o. daily for nutritional supplementation. 12.  Continue propranolol 10 mg p.o. twice daily for hypertension as well as tremor. 13.  Continue Senokot 1 tablet p.o. daily as needed constipation. 14.  Continue Carafate 1 g p.o. 4 times daily for gastric erosion. 15.  Add Protonix 40 mg p.o. daily for reflux disease.  We will overlap with Pepcid for 1 to 2 days, and then stop the Pepcid. 16.  Continue vitamin B12 1000 mcg p.o. daily for nutritional supplementation. 17.    Add Wellbutrin short acting 75 mg p.o. daily for depressive symptoms. 18.  Disposition planning-in progress.  Sharma Covert, MD 01/05/2020, 10:21 AM

## 2020-01-05 NOTE — Plan of Care (Signed)
  Problem: Education: Goal: Mental status will improve Outcome: Progressing   Problem: Coping: Goal: Ability to verbalize frustrations and anger appropriately will improve Outcome: Progressing Goal: Ability to demonstrate self-control will improve Outcome: Progressing   Problem: Safety: Goal: Periods of time without injury will increase Outcome: Progressing   Problem: Education: Goal: Knowledge of the prescribed therapeutic regimen will improve Outcome: Progressing   Problem: Coping: Goal: Coping ability will improve Outcome: Progressing   Problem: Education: Goal: Emotional status will improve Outcome: Not Progressing

## 2020-01-05 NOTE — Progress Notes (Signed)
Patient cooperative, denies SI, HI, AVH. Continues to speak of gastric issues and bad breath, medications given and ordered as prescribed. Pt anxious this am and complains of pain. PRN given with good relief. Eating meals well, visible with milieu. Interaction with staff and peers appropriate. Reports depression at 8, hopelessness at 6 and anxiety 7. Goal for today is to take medications.  Encouragement and support offered. Safety checks maintained. Medications given as prescribed. Patient remains safe on unit with q 15 min checks.

## 2020-01-06 DIAGNOSIS — F319 Bipolar disorder, unspecified: Secondary | ICD-10-CM

## 2020-01-06 NOTE — Progress Notes (Signed)
D: Patient has been out of her room more. Calm and cooperative. Mood is pleasant. Denies SI, HI and AVH. A: Continue to monitor for safety R: Safety maintained

## 2020-01-06 NOTE — Progress Notes (Signed)
[facility] MD Progress Note  01/06/2020 4:19 PM Leslie Wong  MRN:  [id number] Subjective: Patient seen chart reviewed.  This is a 64 year old woman with bipolar depression.  Patient tells me she had been having trouble eating and had been very depressed for months before her daughter insisted that she come to the hospital.  Patient had been off medicine for a while and noncompliant with outpatient treatment.  She remains depressed anxious flat and withdrawn but denies any acute suicidal intent.  Tolerating medicine adequately at the time. Principal Problem: <principal problem not specified> Diagnosis: Active Problems:   MDD (major depressive disorder)  Total Time spent with patient: 30 minutes  Past Psychiatric History: Long history of recurrent episodes of depression and psychosis  Past Medical History:  Past Medical History:  Diagnosis Date  . Back pain   . Cancer ([place])    skin  . Depression    BAD  . Gastroparesis   . GERD (gastroesophageal reflux disease)   . Hypothyroidism   . Insomnia   . Migraines   . Parkinson disease ([place])    per [place] [facility], dx'd 2019  . Recurrent cold sores   . Shoulder bursitis   . Urge incontinence     Past Surgical History:  Procedure Laterality Date  . ABDOMINAL HYSTERECTOMY  1999   total  . APPENDECTOMY    . ESOPHAGOGASTRODUODENOSCOPY  01/2006   negative except small hiatal hernia  . ESOPHAGOGASTRODUODENOSCOPY  02/24/2015   See report  . LAPAROSCOPY     for endometriosis  . OVARIAN CYST REMOVAL  1990   unilateral  . TONSILLECTOMY AND ADENOIDECTOMY     Family History:  Family History  Problem Relation Age of Onset  . Hypertension Mother   . Arthritis Mother   . Diabetes Mother   . Stroke Mother   . Cancer Father        lung  . Colon cancer Neg Hx   . Breast cancer Neg Hx    Family Psychiatric  History: See previous Social History:  Social History   Substance and Sexual Activity  Alcohol Use No  . Alcohol/week: 0.0  standard drinks     Social History   Substance and Sexual Activity  Drug Use No    Social History   Socioeconomic History  . Marital status: Divorced    Spouse name: Not on file  . Number of children: Not on file  . Years of education: Not on file  . Highest education level: Not on file  Occupational History  . Not on file  Tobacco Use  . Smoking status: Current Some Day Smoker    Packs/day: 1.00    Years: 42.50    Pack years: 42.50    Types: Cigarettes  . Smokeless tobacco: Former Systems developer  . Tobacco comment: declined  Substance and Sexual Activity  . Alcohol use: No    Alcohol/week: 0.0 standard drinks  . Drug use: No  . Sexual activity: Never  Other Topics Concern  . Not on file  Social History Narrative   Lives alone.     Has a dog names Dixie   Social Determinants of Health   Financial Resource Strain:   . Difficulty of Paying Living Expenses: Not on file  Food Insecurity:   . Worried About Charity fundraiser in the Last Year: Not on file  . Ran Out of Food in the Last Year: Not on file  Transportation Needs:   . Lack of Transportation (Medical):  Not on file  . Lack of Transportation (Non-Medical): Not on file  Physical Activity:   . Days of Exercise per Week: Not on file  . Minutes of Exercise per Session: Not on file  Stress:   . Feeling of Stress : Not on file  Social Connections:   . Frequency of Communication with Friends and Family: Not on file  . Frequency of Social Gatherings with Friends and Family: Not on file  . Attends Religious Services: Not on file  . Active Member of Clubs or Organizations: Not on file  . Attends Archivist Meetings: Not on file  . Marital Status: Not on file   Additional Social History:                         Sleep: Fair  Appetite:  Fair  Current Medications: Current Facility-Administered Medications  Medication Dose Route Frequency Provider Last Rate Last Admin  . albuterol (VENTOLIN HFA) 108  (90 Base) MCG/ACT inhaler 2 puff  2 puff Inhalation Q6H PRN Sharma Covert, MD      . alum & mag hydroxide-simeth (MAALOX/MYLANTA) 200-200-20 MG/5ML suspension 30 mL  30 mL Oral Q4H PRN Sharma Covert, MD   30 mL at 01/06/20 1602  . ascorbic acid (VITAMIN C) tablet 500 mg  500 mg Oral Daily Sharma Covert, MD   500 mg at 01/06/20 0818  . buPROPion [facility]) tablet 75 mg  75 mg Oral Daily Sharma Covert, MD   75 mg at 01/06/20 0820  . cephALEXin (KEFLEX) capsule 500 mg  500 mg Oral Q8H Sharma Covert, MD   500 mg at 01/06/20 1005  . cholecalciferol (VITAMIN D) tablet 1,000 Units  1,000 Units Oral Daily Sharma Covert, MD   1,000 Units at 01/06/20 [phone]  . famotidine (PEPCID) tablet 20 mg  20 mg Oral BID Sharma Covert, MD   20 mg at 01/06/20 1602  . feeding supplement (ENSURE ENLIVE) (ENSURE ENLIVE) liquid 237 mL  237 mL Oral TID BM Cristofano, Paul A, MD   237 mL at 01/06/20 1439  . haloperidol lactate (HALDOL) injection 2 mg  2 mg Intramuscular Q6H PRN Sharma Covert, MD      . lamoTRIgine (LAMICTAL) tablet 50 mg  50 mg Oral Daily Sharma Covert, MD   50 mg at 01/06/20 0817  . levothyroxine (SYNTHROID) tablet 88 mcg  88 mcg Oral Daily Sharma Covert, MD   88 mcg at 01/06/20 0610  . LORazepam (ATIVAN) tablet 1 mg  1 mg Oral Q6H PRN Sharma Covert, MD   1 mg at 01/05/20 2132  . magnesium hydroxide (MILK OF MAGNESIA) suspension 30 mL  30 mL Oral Daily PRN Sharma Covert, MD      . multivitamin with minerals tablet 1 tablet  1 tablet Oral Daily Cristofano, Dorene Ar, MD   1 tablet at 01/06/20 0819  . OLANZapine (ZYPREXA) tablet 20 mg  20 mg Oral QHS Sharma Covert, MD   20 mg at 01/05/20 2118  . oxyCODONE (Oxy IR/ROXICODONE) immediate release tablet 5 mg  5 mg Oral Q6H PRN Cristofano, Dorene Ar, MD   5 mg at 01/05/20 2009  . pantoprazole (PROTONIX) EC tablet 40 mg  40 mg Oral Daily Sharma Covert, MD   40 mg at 01/06/20 0818  . propranolol (INDERAL)  tablet 10 mg  10 mg Oral BID Sharma Covert, MD   10  mg at 01/06/20 0818  . senna (SENOKOT) tablet 8.6 mg  1 tablet Oral Daily PRN Sharma Covert, MD      . sucralfate (CARAFATE) tablet 1 g  1 g Oral QID Sharma Covert, MD   1 g at 01/06/20 1602  . vitamin B-12 (CYANOCOBALAMIN) tablet 1,000 mcg  1,000 mcg Oral Daily Sharma Covert, MD   1,000 mcg at 01/06/20 [id number]    Lab Results: No results found for this or any previous visit (from the past 59 hour(s)).  Blood Alcohol level:  Lab Results  Component Value Date   ETH <10 12/20/2019   ETH <10 123XX123    Metabolic Disorder Labs: Lab Results  Component Value Date   HGBA1C 5.4 10/13/2018   MPG 108.28 10/13/2018   MPG 111.15 08/29/2018   No results found for: PROLACTIN Lab Results  Component Value Date   CHOL 182 01/03/2020   TRIG 278 (H) 01/03/2020   HDL 33 (L) 01/03/2020   CHOLHDL 5.5 01/03/2020   VLDL 56 (H) 01/03/2020   LDLCALC 93 01/03/2020   LDLCALC 103 (H) 10/13/2018    Physical Findings: AIMS:  , ,  ,  ,    CIWA:    COWS:     Musculoskeletal: Strength & Muscle Tone: within normal limits Gait & Station: normal Patient leans: N/A  Psychiatric Specialty Exam: Physical Exam  Nursing note and vitals reviewed. Constitutional: She appears well-developed and well-nourished.  HENT:  Head: Normocephalic and atraumatic.  Eyes: Pupils are equal, round, and reactive to light. Conjunctivae are normal.  Cardiovascular: Regular rhythm and normal heart sounds.  Respiratory: Effort normal. No respiratory distress.  GI: Soft.  Musculoskeletal:        General: Normal range of motion.     Cervical back: Normal range of motion.  Neurological: She is alert.  Skin: Skin is warm and dry.  Psychiatric: Her affect is blunt. Her speech is delayed. She is slowed. Thought content is paranoid. Cognition and memory are impaired. She expresses impulsivity. She exhibits a depressed mood. She expresses no homicidal and no  suicidal ideation.    Review of Systems  Constitutional: Negative.   HENT: Negative.   Eyes: Negative.   Respiratory: Negative.   Cardiovascular: Negative.   Gastrointestinal: Negative.   Musculoskeletal: Negative.   Skin: Negative.   Neurological: Negative.   Psychiatric/Behavioral: Positive for dysphoric mood. The patient is nervous/anxious.     Blood pressure 110/78, pulse 88, temperature 98.4 F (36.9 C), temperature source Oral, resp. rate 18, height 4\' 11"  (1.499 m), weight 42.2 kg, SpO2 99 %.Body mass index is 18.78 kg/m.  General Appearance: Disheveled  Eye Contact:  Fair  Speech:  Slow  Volume:  Decreased  Mood:  Depressed  Affect:  Constricted  Thought Process:  Disorganized  Orientation:  Full (Time, Place, and Person)  Thought Content:  Illogical  Suicidal Thoughts:  No  Homicidal Thoughts:  No  Memory:  Immediate;   Fair Recent;   Fair Remote;   Fair  Judgement:  Fair  Insight:  Fair  Psychomotor Activity:  Decreased  Concentration:  Concentration: Fair  Recall:  [employer] of Knowledge:  Fair  Language:  Fair  Akathisia:  No  Handed:  Right  AIMS (if indicated):     Assets:  Desire for Improvement Housing Resilience Social Support  ADL's:  Impaired  Cognition:  Impaired,  Mild  Sleep:  Number of Hours: 7     Treatment Plan Summary: Daily  contact with patient to assess and evaluate symptoms and progress in treatment, Medication management and Plan Patient showing gradual improvement in depression.  Apparently has been functioning poorly at home.  She inquires about the potential availability of home health which is certainly something we could look into.  No changes to medicine.  Encourage group attendance.  Alethia Berthold, MD 01/06/2020, 4:19 PM

## 2020-01-06 NOTE — Progress Notes (Addendum)
Recreation Therapy Notes  Date: 01/06/2020  Time: 9:30 am  Location: Craft room  Behavioral response: Appropriate  Intervention Topic: Necessities   Discussion/Intervention:  Group content on today was focused on necessities. The group defined necessities and how they determine their necessities. Individuals expressed how many necessities they have and if it changes from day to day. Patients described the difference between wants and needs. The group explained how they have overspent on wants in the past. Individuals described a reoccurring necessity for them. The intervention "What I need" helped patients differentiate between wants and needs.  Clinical Observations/Feedback:  Patient came to group late and was focused on what peers and staff had to say about necessities. Individual participated in the intervention during group and was social with peers and staff. Leslie Wong LRT/CTRS          Leslie Wong 01/06/2020 11:05 AM

## 2020-01-06 NOTE — Plan of Care (Signed)
  Problem: Education: Goal: Knowledge of [facility] Education information/materials will improve Outcome: Progressing Goal: Emotional status will improve Outcome: Progressing Goal: Mental status will improve Outcome: Progressing Goal: Verbalization of understanding the information provided will improve Outcome: Progressing  D: Patient has been out of her room more. Calm and cooperative. Mood is pleasant. Denies SI, HI and AVH. A: Continue to monitor for safety R: Safety maintained

## 2020-01-06 NOTE — Plan of Care (Signed)
Patient rated her depression and anxiety 4/10.Denies SI,HI and AVH at this time.Calm and cooperative on approach.Patient stated that she did not take care of herself at home and she cannot take care of herself anymore.No stomach issues verbalized at this time.Appropriate in the milieu.Compliant with medications.Attended groups.Appetite and energy level good.Support and encouragement given.

## 2020-01-06 NOTE — [facility] Group Notes (Signed)
LCSW Group Therapy Note   01/06/2020 11:02 AM   Type of Therapy and Topic:  Group Therapy:  Overcoming Obstacles   Participation Level:  Did Not Attend   Description of Group:    In this group patients will be encouraged to explore what they see as obstacles to their own wellness and recovery. They will be guided to discuss their thoughts, feelings, and behaviors related to these obstacles. The group will process together ways to cope with barriers, with attention given to specific choices patients can make. Each patient will be challenged to identify changes they are motivated to make in order to overcome their obstacles. This group will be process-oriented, with patients participating in exploration of their own experiences as well as giving and receiving support and challenge from other group members.   Therapeutic Goals: 1. Patient will identify personal and current obstacles as they relate to admission. 2. Patient will identify barriers that currently interfere with their wellness or overcoming obstacles.  3. Patient will identify feelings, thought process and behaviors related to these barriers. 4. Patient will identify two changes they are willing to make to overcome these obstacles:      Summary of Patient Progress x     Therapeutic Modalities:   Cognitive Behavioral Therapy Solution Focused Therapy Motivational Interviewing Relapse Prevention Therapy  Evalina Field, MSW, LCSW Clinical Social Work 01/06/2020 11:02 AM

## 2020-01-07 DIAGNOSIS — F3130 Bipolar disorder, current episode depressed, mild or moderate severity, unspecified: Secondary | ICD-10-CM | POA: Diagnosis present

## 2020-01-07 MED ORDER — OXYCODONE HCL 5 MG PO TABS
5.0000 mg | ORAL_TABLET | Freq: Two times a day (BID) | ORAL | Status: DC | PRN
  Administered 2020-01-07 – 2020-01-10 (×6): 5 mg via ORAL
  Filled 2020-01-07 (×6): qty 1

## 2020-01-07 NOTE — Progress Notes (Signed)
D: Patient has been pleasant and cooperative. Denies SI, HI and AVH. Continues to complain about acid reflux. No other complaints A: continue to monitor for safety R: Safety maintained

## 2020-01-07 NOTE — BHH Counselor (Signed)
CSW was stopped by patient and NP Darnelle Maffucci in the hallway.  Pt requested to have a consent signe for her daughter. CSW explained form and got consent to speak with pt's daughter.  Pt reports that her daughter wants her to go to Assisted Living or have someone come into the home and help her. Pt then stated that "she was told that I have to have someone with me".  CSW sought clarification and pt reports that her daughter was told this but pt was not sure by who.    CSW asked pt what would she like to do.  Pt was unable to answer beyond stating "home health is better than a nursing home".    CSW asked for APS involvement and pt denied.  Pt reports that she is her own guardian.  CSW messaged fellow CSWs to assist patient further.   Assunta Curtis, MSW, LCSW 01/07/2020 12:33 PM

## 2020-01-07 NOTE — [facility] Counselor (Signed)
CSW followed up with patient to clarify if patient wanted home health as there was some confusion. CSW was informed that patient did want home health.  Patient reports that she wants assistance with completing tasks around the home.   She requested that CSW call patient's daughter to keep her updated.    CSW staffed with other CSWs and was informed that referrals for CST were being made.  Assunta Curtis, MSW, LCSW 01/07/2020 2:05 PM

## 2020-01-07 NOTE — [facility] Group Notes (Signed)
Feelings Around Diagnosis 01/07/2020 1PM  Type of Therapy/Topic:  Group Therapy:  Feelings about Diagnosis  Participation Level:  Did Not Attend   Description of Group:   This group will allow patients to explore their thoughts and feelings about diagnoses they have received. Patients will be guided to explore their level of understanding and acceptance of these diagnoses. Facilitator will encourage patients to process their thoughts and feelings about the reactions of others to their diagnosis and will guide patients in identifying ways to discuss their diagnosis with significant others in their lives. This group will be process-oriented, with patients participating in exploration of their own experiences, giving and receiving support, and processing challenge from other group members.   Therapeutic Goals: 1. Patient will demonstrate understanding of diagnosis as evidenced by identifying two or more symptoms of the disorder 2. Patient will be able to express two feelings regarding the diagnosis 3. Patient will demonstrate their ability to communicate their needs through discussion and/or role play  Summary of Patient Progress:       Therapeutic Modalities:   Cognitive Behavioral Therapy Brief Therapy Feelings Identification    Yvette Rack, LCSW 01/07/2020 2:04 PM

## 2020-01-07 NOTE — Plan of Care (Signed)
Pt rates depression 8/10 and anxiety 5/10. Pt denies SI, HI and AVS. Pt was educated on care plan and verbalizes understanding. Collier Bullock RN Problem: Education: Goal: Knowledge of [facility] Education information/materials will improve Outcome: Progressing Goal: Emotional status will improve Outcome: Progressing Goal: Mental status will improve Outcome: Progressing Goal: Verbalization of understanding the information provided will improve Outcome: Progressing   Problem: Activity: Goal: Interest or engagement in activities will improve Outcome: Progressing Goal: Sleeping patterns will improve Outcome: Progressing   Problem: Coping: Goal: Ability to verbalize frustrations and anger appropriately will improve Outcome: Progressing Goal: Ability to demonstrate self-control will improve Outcome: Progressing   Problem: Health Behavior/Discharge Planning: Goal: Identification of resources available to assist in meeting health care needs will improve Outcome: Progressing Goal: Compliance with treatment plan for underlying cause of condition will improve Outcome: Progressing   Problem: Physical Regulation: Goal: Ability to maintain clinical measurements within normal limits will improve Outcome: Progressing   Problem: Safety: Goal: Periods of time without injury will increase Outcome: Progressing   Problem: Education: Goal: Utilization of techniques to improve thought processes will improve Outcome: Progressing Goal: Knowledge of the prescribed therapeutic regimen will improve Outcome: Progressing   Problem: Coping: Goal: Coping ability will improve Outcome: Progressing Goal: Will verbalize feelings Outcome: Progressing   Problem: Health Behavior/Discharge Planning: Goal: Ability to make decisions will improve Outcome: Progressing Goal: Compliance with therapeutic regimen will improve Outcome: Progressing

## 2020-01-07 NOTE — Plan of Care (Signed)
  Problem: Education: Goal: Knowledge of [facility] Education information/materials will improve Outcome: Progressing Goal: Emotional status will improve Outcome: Progressing Goal: Mental status will improve Outcome: Progressing Goal: Verbalization of understanding the information provided will improve Outcome: Progressing  D: Patient has been pleasant and cooperative. Denies SI, HI and AVH. Continues to complain about acid reflux. No other complaints A: continue to monitor for safety R: Safety maintained

## 2020-01-07 NOTE — Progress Notes (Signed)
Recreation Therapy Notes  Date: 01/07/2020  Time: 9:30 am   Location: Craft room   Behavioral response: N/A   Intervention Topic: Self-care   Discussion/Intervention: Patient did not attend group.   Clinical Observations/Feedback:  Patient did not attend group.   Joni Colegrove LRT/CTRS        Alantis Bethune 01/07/2020 10:46 AM

## 2020-01-07 NOTE — Progress Notes (Signed)
Pt states that she feels "okay".and she is "thinking about her kids and grand kids". She says that she gets to see them. Pt has been calm, cooperative with a depressed effect. Collier Bullock RN

## 2020-01-07 NOTE — Progress Notes (Signed)
[facility] MD Progress Note  01/07/2020 11:22 AM Leslie Wong  MRN:  [id number]   Subjective: Follow-up for this patient diagnosed with bipolar 1 most recent episode of depressed.  Patient reports that she is continuing to feel depressed and anxious.  She reports her depression at a 7 out of 10 and her anxiety at a 7 out of 10 with 10 being the worst.  Patient states she is not having any suicidal or homicidal ideations and denies any hallucinations.  Patient reports that when she was at home she was having some stomach issues which was preventing her from eating and preventing her from taking her medication.  She states that over that timeframe she was having a decrease in her mental health stability which is brought her back to the hospital.  She states that the medication is helping her and she is denying having any medication side effects.  She reports that she does feel a little better than when she first presented to the hospital.  She reports that she would like to have some help in her home.  She states that she would like to have someone come by and help her with cleaning the house, managing her medications, and making sure that she has meals prepared for her to eat.  Principal Problem: Bipolar I disorder, most recent episode depressed ([place]) Diagnosis: Principal Problem:   Bipolar I disorder, most recent episode depressed ([place]) Active Problems:   MDD (major depressive disorder)  Total Time spent with patient: 30 minutes  Past Psychiatric History: Patient has several psychiatric hospitalizations in the past.  Her last psychiatric admission to our facility was in June of this year.  She has previously been diagnosed with opiate dependence on high-dose opiates and other drugs.  Patient has appeared at times have a very difficult time functioning outside of the hospital.  She had just been in the hospital a month prior to the June admission.  She had been in reasonable condition at that  time.  Past Medical History:  Past Medical History:  Diagnosis Date  . Back pain   . Cancer ([place])    skin  . Depression    BAD  . Gastroparesis   . GERD (gastroesophageal reflux disease)   . Hypothyroidism   . Insomnia   . Migraines   . Parkinson disease ([place])    per [facility], dx'd 2019  . Recurrent cold sores   . Shoulder bursitis   . Urge incontinence     Past Surgical History:  Procedure Laterality Date  . ABDOMINAL HYSTERECTOMY  1999   total  . APPENDECTOMY    . ESOPHAGOGASTRODUODENOSCOPY  01/2006   negative except small hiatal hernia  . ESOPHAGOGASTRODUODENOSCOPY  02/24/2015   See report  . LAPAROSCOPY     for endometriosis  . OVARIAN CYST REMOVAL  1990   unilateral  . TONSILLECTOMY AND ADENOIDECTOMY     Family History:  Family History  Problem Relation Age of Onset  . Hypertension Mother   . Arthritis Mother   . Diabetes Mother   . Stroke Mother   . Cancer Father        lung  . Colon cancer Neg Hx   . Breast cancer Neg Hx    Family Psychiatric  History: None reported Social History:  Social History   Substance and Sexual Activity  Alcohol Use No  . Alcohol/week: 0.0 standard drinks     Social History   Substance and Sexual Activity  Drug  Use No    Social History   Socioeconomic History  . Marital status: Divorced    Spouse name: Not on file  . Number of children: Not on file  . Years of education: Not on file  . Highest education level: Not on file  Occupational History  . Not on file  Tobacco Use  . Smoking status: Current Some Day Smoker    Packs/day: 1.00    Years: 42.50    Pack years: 42.50    Types: Cigarettes  . Smokeless tobacco: Former Systems developer  . Tobacco comment: declined  Substance and Sexual Activity  . Alcohol use: No    Alcohol/week: 0.0 standard drinks  . Drug use: No  . Sexual activity: Never  Other Topics Concern  . Not on file  Social History Narrative   Lives alone.     Has a dog names Dixie   Social  Determinants of Health   Financial Resource Strain:   . Difficulty of Paying Living Expenses: Not on file  Food Insecurity:   . Worried About Charity fundraiser in the Last Year: Not on file  . Ran Out of Food in the Last Year: Not on file  Transportation Needs:   . Lack of Transportation (Medical): Not on file  . Lack of Transportation (Non-Medical): Not on file  Physical Activity:   . Days of Exercise per Week: Not on file  . Minutes of Exercise per Session: Not on file  Stress:   . Feeling of Stress : Not on file  Social Connections:   . Frequency of Communication with Friends and Family: Not on file  . Frequency of Social Gatherings with Friends and Family: Not on file  . Attends Religious Services: Not on file  . Active Member of Clubs or Organizations: Not on file  . Attends Archivist Meetings: Not on file  . Marital Status: Not on file   Additional Social History:                         Sleep: Fair  Appetite:  Fair  Current Medications: Current Facility-Administered Medications  Medication Dose Route Frequency Provider Last Rate Last Admin  . albuterol (VENTOLIN HFA) 108 (90 Base) MCG/ACT inhaler 2 puff  2 puff Inhalation Q6H PRN Sharma Covert, MD      . alum & mag hydroxide-simeth (MAALOX/MYLANTA) 200-200-20 MG/5ML suspension 30 mL  30 mL Oral Q4H PRN Sharma Covert, MD   30 mL at 01/06/20 2123  . ascorbic acid (VITAMIN C) tablet 500 mg  500 mg Oral Daily Sharma Covert, MD   500 mg at 01/07/20 0901  . buPROPion [facility]) tablet 75 mg  75 mg Oral Daily Sharma Covert, MD   75 mg at 01/07/20 0859  . cephALEXin (KEFLEX) capsule 500 mg  500 mg Oral Q8H Sharma Covert, MD   500 mg at 01/07/20 [id number]  . cholecalciferol (VITAMIN D) tablet 1,000 Units  1,000 Units Oral Daily Sharma Covert, MD   1,000 Units at 01/07/20 0900  . famotidine (PEPCID) tablet 20 mg  20 mg Oral BID Sharma Covert, MD   20 mg at 01/07/20 0901  .  feeding supplement (ENSURE ENLIVE) (ENSURE ENLIVE) liquid 237 mL  237 mL Oral TID BM Cristofano, Paul A, MD   237 mL at 01/06/20 1956  . haloperidol lactate (HALDOL) injection 2 mg  2 mg Intramuscular Q6H PRN Sharma Covert,  MD      . lamoTRIgine (LAMICTAL) tablet 50 mg  50 mg Oral Daily Sharma Covert, MD   50 mg at 01/07/20 0902  . levothyroxine (SYNTHROID) tablet 88 mcg  88 mcg Oral Daily Sharma Covert, MD   88 mcg at 01/07/20 [id number]  . LORazepam (ATIVAN) tablet 1 mg  1 mg Oral Q6H PRN Sharma Covert, MD   1 mg at 01/07/20 0906  . magnesium hydroxide (MILK OF MAGNESIA) suspension 30 mL  30 mL Oral Daily PRN Sharma Covert, MD      . multivitamin with minerals tablet 1 tablet  1 tablet Oral Daily Cristofano, Dorene Ar, MD   1 tablet at 01/07/20 0900  . OLANZapine (ZYPREXA) tablet 20 mg  20 mg Oral QHS Sharma Covert, MD   20 mg at 01/06/20 2116  . oxyCODONE (Oxy IR/ROXICODONE) immediate release tablet 5 mg  5 mg Oral Q12H PRN Achol Azpeitia, Lowry Ram, FNP      . pantoprazole (PROTONIX) EC tablet 40 mg  40 mg Oral Daily Sharma Covert, MD   40 mg at 01/07/20 0901  . propranolol (INDERAL) tablet 10 mg  10 mg Oral BID Sharma Covert, MD   10 mg at 01/07/20 0859  . senna (SENOKOT) tablet 8.6 mg  1 tablet Oral Daily PRN Sharma Covert, MD      . sucralfate (CARAFATE) tablet 1 g  1 g Oral QID Sharma Covert, MD   1 g at 01/07/20 0900  . vitamin B-12 (CYANOCOBALAMIN) tablet 1,000 mcg  1,000 mcg Oral Daily Sharma Covert, MD   1,000 mcg at 01/07/20 0900    Lab Results: No results found for this or any previous visit (from the past 48 hour(s)).  Blood Alcohol level:  Lab Results  Component Value Date   ETH <10 12/20/2019   ETH <10 [id number]    Metabolic Disorder Labs: Lab Results  Component Value Date   HGBA1C 5.4 10/13/2018   MPG 108.28 10/13/2018   MPG 111.15 08/29/2018   No results found for: PROLACTIN Lab Results  Component Value Date   CHOL 182  01/03/2020   TRIG 278 (H) 01/03/2020   HDL 33 (L) 01/03/2020   CHOLHDL 5.5 01/03/2020   VLDL 56 (H) 01/03/2020   LDLCALC 93 01/03/2020   LDLCALC 103 (H) 10/13/2018    Physical Findings: AIMS:  , ,  ,  ,    CIWA:    COWS:     Musculoskeletal: Strength & Muscle Tone: within normal limits Gait & Station: normal Patient leans: N/A  Psychiatric Specialty Exam: Physical Exam  Nursing note and vitals reviewed. Constitutional: She is oriented to person, place, and time. She appears well-developed and well-nourished.  Cardiovascular: Normal rate.  Respiratory: Effort normal.  Musculoskeletal:        General: Normal range of motion.  Neurological: She is alert and oriented to person, place, and time.  Skin: Skin is warm.  Psychiatric: Her mood appears anxious. She exhibits a depressed mood.    Review of Systems  Constitutional: Negative.   HENT: Negative.   Eyes: Negative.   Respiratory: Negative.   Cardiovascular: Negative.   Gastrointestinal: Negative.   Genitourinary: Negative.   Musculoskeletal: Negative.   Skin: Negative.   Neurological: Negative.   Psychiatric/Behavioral: Positive for dysphoric mood. The patient is nervous/anxious.     Blood pressure 124/83, pulse 93, temperature 98.7 F (37.1 C), temperature source Oral, resp. rate 18, height 4\' 11"  (  1.499 m), weight 42.2 kg, SpO2 98 %.Body mass index is 18.78 kg/m.  General Appearance: Disheveled  Eye Contact:  Good  Speech:  Clear and Coherent and Normal Rate  Volume:  Decreased  Mood:  Anxious and Depressed  Affect:  Flat  Thought Process:  Coherent and Descriptions of Associations: Intact  Orientation:  Full (Time, Place, and Person)  Thought Content:  WDL  Suicidal Thoughts:  No  Homicidal Thoughts:  No  Memory:  Immediate;   Fair Recent;   Fair Remote;   Fair  Judgement:  Fair  Insight:  Fair  Psychomotor Activity:  Normal  Concentration:  Concentration: Fair  Recall:  [employer] of Knowledge:   Fair  Language:  Fair  Akathisia:  No  Handed:  Right  AIMS (if indicated):     Assets:  Desire for Improvement Financial Resources/Insurance Housing Resilience Social Support  ADL's:  Intact  Cognition:  WNL  Sleep:  Number of Hours: 6.75   Assessment: Patient presents in her room lying down but is awake.  Patient did report sleeping better but it was disrupted some but was able to go back to sleep.  She states her appetite has improved some.  After reviewing the patient's chart would not change any of her psychiatric medications at this time she states that she is showing some improvement after being off her medications for some time.  Patient is only been taking her pain medications approximately once a day and will change that to a every 12 hours as needed for moderate pain.  Also discussed with the nursing staff about the patient's having a CIWA scoring.  They state that the patient is mainly been presenting with anxiety.  We will manage the patient to rest today for need of Ativan as needed for CIWA greater than 10.  If not needed may decrease to 0.5 mg for anxiety but would not continue the 1 mg.  Treatment Plan Summary: Daily contact with patient to assess and evaluate symptoms and progress in treatment and Medication management  Continue Wellbutrin 75 mg p.o. daily for depression Continue Lamictal 50 mg p.o. daily for mood stability Continue Zyprexa 20 mg p.o. nightly for bipolar disorder Continue propanolol 10 mg p.o. twice daily for anxiety Decrease oxycodone IR 5 mg 2 every 12 hours as needed for moderate pain Monitor patient for CIWA greater than 10 Continue Protonix 40 mg p.o. daily for GERD Continue Synthroid 88 mcg p.o. daily for hypothyroidism Encourage group therapy participation Social work notified of patient requesting assisted living or in-home health to manage ADLs Continue every 15 minute safety checks  Lowry Ram Raynald Rouillard, FNP 01/07/2020, 11:22 AM

## 2020-01-08 ENCOUNTER — Telehealth: Payer: Self-pay | Admitting: Gastroenterology

## 2020-01-08 NOTE — Progress Notes (Signed)
Patient alert and oriented x 4, she denies SI/HI/AVH, her thoughts are organized and coherent, she is interacting with peers and staff appropriately, she appears needy, anxious, restless she was receptive to staff, took her scheduled evening medication and she attended evening wrap up group. 15 minutes safety checks maintained will continue to closely monitor.

## 2020-01-08 NOTE — Progress Notes (Signed)
[facility] MD Progress Note  01/08/2020 4:38 PM Leslie Wong  MRN:  [id number] Subjective: Follow-up with this 64 year old woman with chronic mental illness.  Patient seen chart reviewed.  Patient tells me she is feeling better today.  She is not having any hallucinations.  She has not been thinking about suicide.  She does not feel down and negative.  Patient has been up out of her room participating in groups eating normally.  Taking her medicine.  No new physical complaints. Principal Problem: Bipolar I disorder, most recent episode depressed ([place]) Diagnosis: Principal Problem:   Bipolar I disorder, most recent episode depressed ([place]) Active Problems:   MDD (major depressive disorder)  Total Time spent with patient: 30 minutes  Past Psychiatric History: Patient has a longstanding history of chronic illness bipolar versus schizoaffective most likely  Past Medical History:  Past Medical History:  Diagnosis Date  . Back pain   . Cancer ([place])    skin  . Depression    BAD  . Gastroparesis   . GERD (gastroesophageal reflux disease)   . Hypothyroidism   . Insomnia   . Migraines   . Parkinson disease ([place])    per [place] [facility], dx'd 2019  . Recurrent cold sores   . Shoulder bursitis   . Urge incontinence     Past Surgical History:  Procedure Laterality Date  . ABDOMINAL HYSTERECTOMY  1999   total  . APPENDECTOMY    . ESOPHAGOGASTRODUODENOSCOPY  01/2006   negative except small hiatal hernia  . ESOPHAGOGASTRODUODENOSCOPY  02/24/2015   See report  . LAPAROSCOPY     for endometriosis  . OVARIAN CYST REMOVAL  1990   unilateral  . TONSILLECTOMY AND ADENOIDECTOMY     Family History:  Family History  Problem Relation Age of Onset  . Hypertension Mother   . Arthritis Mother   . Diabetes Mother   . Stroke Mother   . Cancer Father        lung  . Colon cancer Neg Hx   . Breast cancer Neg Hx    Family Psychiatric  History: See previous Social History:  Social History    Substance and Sexual Activity  Alcohol Use No  . Alcohol/week: 0.0 standard drinks     Social History   Substance and Sexual Activity  Drug Use No    Social History   Socioeconomic History  . Marital status: Divorced    Spouse name: Not on file  . Number of children: Not on file  . Years of education: Not on file  . Highest education level: Not on file  Occupational History  . Not on file  Tobacco Use  . Smoking status: Current Some Day Smoker    Packs/day: 1.00    Years: 42.50    Pack years: 42.50    Types: Cigarettes  . Smokeless tobacco: Former Systems developer  . Tobacco comment: declined  Substance and Sexual Activity  . Alcohol use: No    Alcohol/week: 0.0 standard drinks  . Drug use: No  . Sexual activity: Never  Other Topics Concern  . Not on file  Social History Narrative   Lives alone.     Has a dog names Dixie   Social Determinants of Health   Financial Resource Strain:   . Difficulty of Paying Living Expenses: Not on file  Food Insecurity:   . Worried About Charity fundraiser in the Last Year: Not on file  . Ran Out of Food in the Last  Year: Not on file  Transportation Needs:   . Lack of Transportation (Medical): Not on file  . Lack of Transportation (Non-Medical): Not on file  Physical Activity:   . Days of Exercise per Week: Not on file  . Minutes of Exercise per Session: Not on file  Stress:   . Feeling of Stress : Not on file  Social Connections:   . Frequency of Communication with Friends and Family: Not on file  . Frequency of Social Gatherings with Friends and Family: Not on file  . Attends Religious Services: Not on file  . Active Member of Clubs or Organizations: Not on file  . Attends Archivist Meetings: Not on file  . Marital Status: Not on file   Additional Social History:                         Sleep: Fair  Appetite:  Fair  Current Medications: Current Facility-Administered Medications  Medication Dose Route  Frequency Provider Last Rate Last Admin  . albuterol (VENTOLIN HFA) 108 (90 Base) MCG/ACT inhaler 2 puff  2 puff Inhalation Q6H PRN Sharma Covert, MD      . alum & mag hydroxide-simeth (MAALOX/MYLANTA) 200-200-20 MG/5ML suspension 30 mL  30 mL Oral Q4H PRN Sharma Covert, MD   30 mL at 01/07/20 2148  . ascorbic acid (VITAMIN C) tablet 500 mg  500 mg Oral Daily Sharma Covert, MD   500 mg at 01/08/20 0824  . bu[facility]) tablet 75 mg  75 mg Oral Daily Sharma Covert, MD   75 mg at 01/08/20 0824  . cephALEXin (KEFLEX) capsule 500 mg  500 mg Oral Q8H Sharma Covert, MD   500 mg at 01/08/20 1030  . cholecalciferol (VITAMIN D) tablet 1,000 Units  1,000 Units Oral Daily Sharma Covert, MD   1,000 Units at 01/08/20 [phone]  . feeding supplement (ENSURE ENLIVE) (ENSURE ENLIVE) liquid 237 mL  237 mL Oral TID BM Cristofano, Paul A, MD   237 mL at 01/08/20 1000  . haloperidol lactate (HALDOL) injection 2 mg  2 mg Intramuscular Q6H PRN Sharma Covert, MD      . lamoTRIgine (LAMICTAL) tablet 50 mg  50 mg Oral Daily Sharma Covert, MD   50 mg at 01/08/20 0825  . levothyroxine (SYNTHROID) tablet 88 mcg  88 mcg Oral Daily Sharma Covert, MD   88 mcg at 01/08/20 [phone]  . LORazepam (ATIVAN) tablet 1 mg  1 mg Oral Q6H PRN Sharma Covert, MD   1 mg at 01/07/20 2148  . magnesium hydroxide (MILK OF MAGNESIA) suspension 30 mL  30 mL Oral Daily PRN Sharma Covert, MD      . multivitamin with minerals tablet 1 tablet  1 tablet Oral Daily Cristofano, Dorene Ar, MD   1 tablet at 01/08/20 0824  . OLANZapine (ZYPREXA) tablet 20 mg  20 mg Oral QHS Sharma Covert, MD   20 mg at 01/07/20 2148  . oxyCODONE (Oxy IR/ROXICODONE) immediate release tablet 5 mg  5 mg Oral Q12H PRN Money, Lowry Ram, FNP   5 mg at 01/08/20 1031  . pantoprazole (PROTONIX) EC tablet 40 mg  40 mg Oral Daily Sharma Covert, MD   40 mg at 01/08/20 0824  . propranolol (INDERAL) tablet 10 mg  10 mg Oral BID  Sharma Covert, MD   10 mg at 01/08/20 0854  . senna (SENOKOT) tablet  8.6 mg  1 tablet Oral Daily PRN Sharma Covert, MD      . sucralfate (CARAFATE) tablet 1 g  1 g Oral QID Sharma Covert, MD   1 g at 01/08/20 1208  . vitamin B-12 (CYANOCOBALAMIN) tablet 1,000 mcg  1,000 mcg Oral Daily Sharma Covert, MD   1,000 mcg at 01/08/20 0825    Lab Results: No results found for this or any previous visit (from the past 48 hour(s)).  Blood Alcohol level:  Lab Results  Component Value Date   ETH <10 12/20/2019   ETH <10 123XX123    Metabolic Disorder Labs: Lab Results  Component Value Date   HGBA1C 5.4 10/13/2018   MPG 108.28 10/13/2018   MPG 111.15 08/29/2018   No results found for: PROLACTIN Lab Results  Component Value Date   CHOL 182 01/03/2020   TRIG 278 (H) 01/03/2020   HDL 33 (L) 01/03/2020   CHOLHDL 5.5 01/03/2020   VLDL 56 (H) 01/03/2020   LDLCALC 93 01/03/2020   LDLCALC 103 (H) 10/13/2018    Physical Findings: AIMS:  , ,  ,  ,    CIWA:    COWS:     Musculoskeletal: Strength & Muscle Tone: within normal limits Gait & Station: normal Patient leans: N/A  Psychiatric Specialty Exam: Physical Exam  Nursing note and vitals reviewed. Constitutional: She appears well-developed and well-nourished.  HENT:  Head: Normocephalic and atraumatic.  Eyes: Pupils are equal, round, and reactive to light. Conjunctivae are normal.  Cardiovascular: Regular rhythm and normal heart sounds.  Respiratory: Effort normal.  GI: Soft.  Musculoskeletal:        General: Normal range of motion.     Cervical back: Normal range of motion.  Neurological: She is alert.  Skin: Skin is warm and dry.  Psychiatric: She has a normal mood and affect. Her speech is normal and behavior is normal. Judgment and thought content normal. Cognition and memory are normal.    Review of Systems  Constitutional: Negative.   HENT: Negative.   Eyes: Negative.   Respiratory: Negative.    Cardiovascular: Negative.   Gastrointestinal: Negative.   Musculoskeletal: Negative.   Skin: Negative.   Neurological: Negative.   Psychiatric/Behavioral: Negative.     Blood pressure 115/72, pulse 98, temperature 98.5 F (36.9 C), temperature source Oral, resp. rate 17, height 4\' 11"  (1.499 m), weight 42.2 kg, SpO2 96 %.Body mass index is 18.78 kg/m.  General Appearance: Casual  Eye Contact:  Fair  Speech:  Clear and Coherent  Volume:  Normal  Mood:  Euthymic  Affect:  Congruent  Thought Process:  Goal Directed  Orientation:  Full (Time, Place, and Person)  Thought Content:  Logical  Suicidal Thoughts:  No  Homicidal Thoughts:  No  Memory:  Immediate;   Fair Recent;   Fair Remote;   Fair  Judgement:  Fair  Insight:  Fair  Psychomotor Activity:  Normal  Concentration:  Concentration: Fair  Recall:  [employer] of Knowledge:  Fair  Language:  Fair  Akathisia:  No  Handed:  Right  AIMS (if indicated):     Assets:  Desire for Improvement Housing Physical Health Resilience  ADL's:  Intact  Cognition:  Impaired,  Mild  Sleep:  Number of Hours: 7.15     Treatment Plan Summary: Daily contact with patient to assess and evaluate symptoms and progress in treatment, Medication management and Plan 64 year old woman with chronic mental illness.  She has settled down  quite a bit and is doing relatively well.  No evidence of acute dangerousness.  Patient is benefiting from treatment.  We discussed the importance of her staying in treatment and staying cooperative with her outpatient team after she left the hospital.  We were probably going to aim for discharge on Friday.  Patient is very agreeable.  Alethia Berthold, MD 01/08/2020, 4:38 PM

## 2020-01-08 NOTE — [facility] Group Notes (Signed)
LCSW Group Therapy Note  01/08/2020 1:00 PM  Type of Therapy/Topic:  Group Therapy:  Emotion Regulation  Participation Level:  Minimal   Description of Group:   The purpose of this group is to assist patients in learning to regulate negative emotions and experience positive emotions. Patients will be guided to discuss ways in which they have been vulnerable to their negative emotions. These vulnerabilities will be juxtaposed with experiences of positive emotions or situations, and patients will be challenged to use positive emotions to combat negative ones. Special emphasis will be placed on coping with negative emotions in conflict situations, and patients will process healthy conflict resolution skills.  Therapeutic Goals: 1. Patient will identify two positive emotions or experiences to reflect on in order to balance out negative emotions 2. Patient will label two or more emotions that they find the most difficult to experience 3. Patient will demonstrate positive conflict resolution skills through discussion and/or role plays  Summary of Patient Progress: Patient was present in group.  Patient shared how she has been strugglign with lonely feelings.  Patient did not share much in group. Patient appeared to be falling asleep as evidenced by appearance and nodding of her head.   Therapeutic Modalities:   Cognitive Behavioral Therapy Feelings Identification Dialectical Behavioral Therapy  Assunta Curtis, MSW, LCSW 01/08/2020 12:34 PM

## 2020-01-08 NOTE — Progress Notes (Signed)
Recreation Therapy Notes     Date: 01/08/2020  Time: 9:30 am  Location: Craft room  Behavioral response: Appropriate  Intervention Topic: Relaxation    Discussion/Intervention:  Group content today was focused on relaxation. The group defined relaxation and identified healthy ways to relax. Individuals expressed how much time they spend relaxing. Patients expressed how much their life would be if they did not make time for themselves to relax. The group stated ways they could improve their relaxation techniques in the future.  Individuals participated in the intervention "Time to Relax" where they had a chance to experience different relaxation techniques.  Clinical Observations/Feedback:  Patient came to group late due to unknown reasons. She explained that she does not relax a lot, because it is not easy for her to relax. Individual participated in the intervention during group and was social with peers and staff. Jibril Mcminn LRT/CTRS         Mamoudou Mulvehill 01/08/2020 11:23 AM

## 2020-01-08 NOTE — Progress Notes (Signed)
Pt says that she is feeling "a little " better today. She is still fixated on her GI issues but says that they are somewhat better today. She went to group today. Her effect is brighter and she laughed. She is calm and cooperative and has been compliant with meds. Collier Bullock RN

## 2020-01-08 NOTE — Telephone Encounter (Signed)
Left vm to offer Ed f/u apt

## 2020-01-08 NOTE — Plan of Care (Signed)
Pt rates anxiety and depression 8/10. Pt denies SI, Hi and AVH.  Pt was educated on care plan and verbalizes understanding. Pt stated "the only harm I did was to myself but I'm coming back around now." Collier Bullock RN Problem: Education: Goal: Knowledge of [place] Education information/materials will improve Outcome: Progressing Goal: Emotional status will improve Outcome: Progressing Goal: Mental status will improve Outcome: Progressing Goal: Verbalization of understanding the information provided will improve Outcome: Progressing   Problem: Activity: Goal: Interest or engagement in activities will improve Outcome: Progressing Goal: Sleeping patterns will improve Outcome: Progressing   Problem: Coping: Goal: Ability to verbalize frustrations and anger appropriately will improve Outcome: Progressing Goal: Ability to demonstrate self-control will improve Outcome: Progressing   Problem: Health Behavior/Discharge Planning: Goal: Identification of resources available to assist in meeting health care needs will improve Outcome: Progressing Goal: Compliance with treatment plan for underlying cause of condition will improve Outcome: Progressing   Problem: Physical Regulation: Goal: Ability to maintain clinical measurements within normal limits will improve Outcome: Progressing   Problem: Safety: Goal: Periods of time without injury will increase Outcome: Progressing   Problem: Education: Goal: Utilization of techniques to improve thought processes will improve Outcome: Progressing Goal: Knowledge of the prescribed therapeutic regimen will improve Outcome: Progressing   Problem: Coping: Goal: Coping ability will improve Outcome: Progressing Goal: Will verbalize feelings Outcome: Progressing   Problem: Health Behavior/Discharge Planning: Goal: Ability to make decisions will improve Outcome: Progressing Goal: Compliance with therapeutic regimen will  improve Outcome: Progressing

## 2020-01-09 NOTE — Progress Notes (Signed)
LCSW Group Therapy Note  01/09/2020 11:36 AM  Type of Therapy/Topic:  Group Therapy:  Balance in Life  Participation Level:  Did Not Attend  Description of Group:    This group will address the concept of balance and how it feels and looks when one is unbalanced. Patients will be encouraged to process areas in their lives that are out of balance and identify reasons for remaining unbalanced. Facilitators will guide patients in utilizing problem-solving interventions to address and correct the stressor making their life unbalanced. Understanding and applying boundaries will be explored and addressed for obtaining and maintaining a balanced life. Patients will be encouraged to explore ways to assertively make their unbalanced needs known to significant others in their lives, using other group members and facilitator for support and feedback.  Therapeutic Goals: 1. Patient will identify two or more emotions or situations they have that consume much of in their lives. 2. Patient will identify signs/triggers that life has become out of balance:  3. Patient will identify two ways to set boundaries in order to achieve balance in their lives:  4. Patient will demonstrate ability to communicate their needs through discussion and/or role plays  Summary of Patient Progress: x     Therapeutic Modalities:   Cognitive Behavioral Therapy Solution-Focused Therapy Assertiveness Training  Evalina Field, MSW, LCSW Clinical Social Work 01/09/2020 11:36 AM

## 2020-01-09 NOTE — [facility] Suicide Risk Assessment (Signed)
[facility] INPATIENT:  Family/Significant Other Suicide Prevention Education  Suicide Prevention Education:  Contact Attempts: Clarita Leber, daughter [phone]  has been identified by the patient as the family member/significant other with whom the patient will be residing, and identified as the person(s) who will aid the patient in the event of a mental health crisis.  With written consent from the patient, two attempts were made to provide suicide prevention education, prior to and/or following the patient's discharge.  We were unsuccessful in providing suicide prevention education.  A suicide education pamphlet was given to the patient to share with family/significant other.  Date and time of first attempt: 01/09/20 CSW left confidential vm, awaiting response Date and time of second attempt: 2nd attempt needed  Cyntha Brickman T Kieon Lawhorn 01/09/2020, 2:37 PM

## 2020-01-09 NOTE — Tx Team (Signed)
Interdisciplinary Treatment and Diagnostic Plan Update  01/09/2020 Time of Session: 09:00am Lissandra Gradowski MRN: [id number]  Principal Diagnosis: Bipolar I disorder, most recent episode depressed ([place])  Secondary Diagnoses: Principal Problem:   Bipolar I disorder, most recent episode depressed ([place]) Active Problems:   MDD (major depressive disorder)   Current Medications:  Current Facility-Administered Medications  Medication Dose Route Frequency Provider Last Rate Last Admin  . albuterol (VENTOLIN HFA) 108 (90 Base) MCG/ACT inhaler 2 puff  2 puff Inhalation Q6H PRN Sharma Covert, MD      . alum & mag hydroxide-simeth (MAALOX/MYLANTA) 200-200-20 MG/5ML suspension 30 mL  30 mL Oral Q4H PRN Sharma Covert, MD   30 mL at 01/08/20 1716  . ascorbic acid (VITAMIN C) tablet 500 mg  500 mg Oral Daily Sharma Covert, MD   500 mg at 01/09/20 0753  . buPROPion [facility]) tablet 75 mg  75 mg Oral Daily Sharma Covert, MD   75 mg at 01/09/20 0756  . cephALEXin (KEFLEX) capsule 500 mg  500 mg Oral Q8H Sharma Covert, MD   500 mg at 01/09/20 1030  . cholecalciferol (VITAMIN D) tablet 1,000 Units  1,000 Units Oral Daily Sharma Covert, MD   1,000 Units at 01/09/20 0754  . feeding supplement (ENSURE ENLIVE) (ENSURE ENLIVE) liquid 237 mL  237 mL Oral TID BM Cristofano, Paul A, MD   237 mL at 01/09/20 1030  . haloperidol lactate (HALDOL) injection 2 mg  2 mg Intramuscular Q6H PRN Sharma Covert, MD      . lamoTRIgine (LAMICTAL) tablet 50 mg  50 mg Oral Daily Sharma Covert, MD   50 mg at 01/09/20 0754  . levothyroxine (SYNTHROID) tablet 88 mcg  88 mcg Oral Daily Sharma Covert, MD   88 mcg at 01/09/20 0644  . LORazepam (ATIVAN) tablet 1 mg  1 mg Oral Q6H PRN Sharma Covert, MD   1 mg at 01/08/20 2124  . magnesium hydroxide (MILK OF MAGNESIA) suspension 30 mL  30 mL Oral Daily PRN Sharma Covert, MD      . multivitamin with minerals tablet 1 tablet  1  tablet Oral Daily Cristofano, Dorene Ar, MD   1 tablet at 01/09/20 0755  . OLANZapine (ZYPREXA) tablet 20 mg  20 mg Oral QHS Sharma Covert, MD   20 mg at 01/08/20 2124  . oxyCODONE (Oxy IR/ROXICODONE) immediate release tablet 5 mg  5 mg Oral Q12H PRN Money, Lowry Ram, FNP   5 mg at 01/09/20 1201  . pantoprazole (PROTONIX) EC tablet 40 mg  40 mg Oral Daily Sharma Covert, MD   40 mg at 01/09/20 0754  . propranolol (INDERAL) tablet 10 mg  10 mg Oral BID Sharma Covert, MD   10 mg at 01/08/20 1714  . senna (SENOKOT) tablet 8.6 mg  1 tablet Oral Daily PRN Sharma Covert, MD      . sucralfate (CARAFATE) tablet 1 g  1 g Oral QID Sharma Covert, MD   1 g at 01/09/20 1201  . vitamin B-12 (CYANOCOBALAMIN) tablet 1,000 mcg  1,000 mcg Oral Daily Sharma Covert, MD   1,000 mcg at 01/09/20 0754   PTA Medications: Facility-Administered Medications Prior to Admission  Medication Dose Route Frequency Provider Last Rate Last Admin  . NON FORMULARY 5 mL  5 mL Oral QID PRN Tonia Ghent, MD       Medications Prior to Admission  Medication  Sig Dispense Refill Last Dose  . albuterol (VENTOLIN HFA) 108 (90 Base) MCG/ACT inhaler Inhale 2 puffs into the lungs every 6 (six) hours as needed for wheezing or shortness of breath. 18 g 11   . ascorbic acid (VITAMIN C) 500 MG tablet Take 1 tablet (500 mg total) by mouth daily. (Patient not taking: Reported on 08/27/2019) 90 tablet 3   . [EXPIRED] cephALEXin (KEFLEX) 500 MG capsule Take 1 capsule (500 mg total) by mouth 2 (two) times daily for 7 days. 14 capsule 0   . cholecalciferol (VITAMIN D) 25 MCG (1000 UT) tablet Take 1 tablet (1,000 Units total) by mouth daily. (Patient not taking: Reported on 08/27/2019) 90 tablet 3   . Diphenhyd-Hydrocort-Nystatin (FIRST-DUKES MOUTHWASH) SUSP Use as directed 5 mLs in the mouth or throat 4 (four) times daily as needed. 120 mL 0   . doxepin (SINEQUAN) 25 MG capsule Take 50 mg by mouth at bedtime.     . famotidine  (PEPCID) 20 MG tablet Take 1 tablet (20 mg total) by mouth 2 (two) times daily. 60 tablet 0   . fluocinonide cream (LIDEX) [id number] % Apply 1 application topically 2 (two) times daily as needed. 30 g 0   . hydrocerin (EUCERIN) CREA Apply 1 application topically 2 (two) times daily. 454 g 0   . hydrOXYzine (ATARAX/VISTARIL) 50 MG tablet Take 1 tablet (50 mg total) by mouth every 6 (six) hours as needed for anxiety. 12 tablet 0   . lamoTRIgine (LAMICTAL) 25 MG tablet Take 2 tablets (50 mg total) by mouth daily. 60 tablet 1   . levothyroxine (SYNTHROID) 88 MCG tablet TAKE 1 TABLET(88 MCG) BY MOUTH DAILY 90 tablet 1   . lidocaine (XYLOCAINE) 2 % solution Use as directed 15 mLs in the mouth or throat as needed for mouth pain. 100 mL 0   . OLANZapine (ZYPREXA) 15 MG tablet Take 15 mg by mouth daily.     . Omega-3 Fatty Acids (FISH OIL) 1000 MG CAPS Take 1 capsule (1,000 mg total) by mouth daily. (Patient not taking: Reported on 08/27/2019) 90 capsule 3   . omeprazole (PRILOSEC) 40 MG capsule Take 40 mg by mouth 2 (two) times daily.     . ondansetron (ZOFRAN-ODT) 4 MG disintegrating tablet Take 1 tablet (4 mg total) by mouth 3 (three) times daily as needed for nausea or vomiting. 30 tablet 1   . propranolol (INDERAL) 10 MG tablet Take 1 tablet (10 mg total) by mouth 2 (two) times daily. 180 tablet 3   . SENOKOT 8.6 MG tablet Take 2 tablets by mouth daily.     . sucralfate (CARAFATE) 1 g tablet Take 1 tablet (1 g total) by mouth 4 (four) times daily. 120 tablet 1   . tiZANidine (ZANAFLEX) 4 MG tablet Take 1 tablet (4 mg total) by mouth every 8 (eight) hours as needed for muscle spasms. 90 tablet 1   . valACYclovir (VALTREX) 500 MG tablet TAKE 1 TABLET(500 MG) BY MOUTH TWICE DAILY 60 tablet 5   . vitamin B-12 (CYANOCOBALAMIN) 1000 MCG tablet Take 1 tablet (1,000 mcg total) by mouth daily. (Patient not taking: Reported on 08/27/2019) 90 tablet 3   . Vitamin D, Ergocalciferol, (DRISDOL) 1.25 MG (50000 UT) CAPS capsule  Take 1 capsule by mouth once a week.       Patient Stressors: Medication change or noncompliance Occupational concerns Substance abuse  Patient Strengths: Average or above average intelligence Communication skills General fund of knowledge Motivation for treatment/growth  Treatment Modalities: Medication Management, Group therapy, Case management,  1 to 1 session with clinician, Psychoeducation, Recreational therapy.   Physician Treatment Plan for Primary Diagnosis: Bipolar I disorder, most recent episode depressed ([employer]) Long Term Goal(s): Improvement in symptoms so as ready for discharge Improvement in symptoms so as ready for discharge   Short Term Goals: Ability to identify changes in lifestyle to reduce recurrence of condition will improve Ability to verbalize feelings will improve Ability to disclose and discuss suicidal ideas Ability to demonstrate self-control will improve Ability to identify and develop effective coping behaviors will improve Ability to maintain clinical measurements within normal limits will improve Compliance with prescribed medications will improve Ability to identify changes in lifestyle to reduce recurrence of condition will improve Ability to verbalize feelings will improve Ability to disclose and discuss suicidal ideas Ability to demonstrate self-control will improve Ability to identify and develop effective coping behaviors will improve Ability to maintain clinical measurements within normal limits will improve Compliance with prescribed medications will improve  Medication Management: Evaluate patient's response, side effects, and tolerance of medication regimen.  Therapeutic Interventions: 1 to 1 sessions, Unit Group sessions and Medication administration.  Evaluation of Outcomes: Progressing  Physician Treatment Plan for Secondary Diagnosis: Principal Problem:   Bipolar I disorder, most recent episode depressed ([place]) Active Problems:    MDD (major depressive disorder)  Long Term Goal(s): Improvement in symptoms so as ready for discharge Improvement in symptoms so as ready for discharge   Short Term Goals: Ability to identify changes in lifestyle to reduce recurrence of condition will improve Ability to verbalize feelings will improve Ability to disclose and discuss suicidal ideas Ability to demonstrate self-control will improve Ability to identify and develop effective coping behaviors will improve Ability to maintain clinical measurements within normal limits will improve Compliance with prescribed medications will improve Ability to identify changes in lifestyle to reduce recurrence of condition will improve Ability to verbalize feelings will improve Ability to disclose and discuss suicidal ideas Ability to demonstrate self-control will improve Ability to identify and develop effective coping behaviors will improve Ability to maintain clinical measurements within normal limits will improve Compliance with prescribed medications will improve     Medication Management: Evaluate patient's response, side effects, and tolerance of medication regimen.  Therapeutic Interventions: 1 to 1 sessions, Unit Group sessions and Medication administration.  Evaluation of Outcomes: Progressing   RN Treatment Plan for Primary Diagnosis: Bipolar I disorder, most recent episode depressed ([place]) Long Term Goal(s): Knowledge of disease and therapeutic regimen to maintain health will improve  Short Term Goals: Ability to participate in decision making will improve, Ability to verbalize feelings will improve, Ability to disclose and discuss suicidal ideas, Ability to identify and develop effective coping behaviors will improve and Compliance with prescribed medications will improve  Medication Management: RN will administer medications as ordered by provider, will assess and evaluate patient's response and provide education to patient for  prescribed medication. RN will report any adverse and/or side effects to prescribing provider.  Therapeutic Interventions: 1 on 1 counseling sessions, Psychoeducation, Medication administration, Evaluate responses to treatment, Monitor vital signs and CBGs as ordered, Perform/monitor CIWA, COWS, AIMS and Fall Risk screenings as ordered, Perform wound care treatments as ordered.  Evaluation of Outcomes: Progressing   LCSW Treatment Plan for Primary Diagnosis: Bipolar I disorder, most recent episode depressed ([place]) Long Term Goal(s): Safe transition to appropriate next level of care at discharge, Engage patient in therapeutic group addressing interpersonal concerns.  Short Term  Goals: Engage patient in aftercare planning with referrals and resources and Increase skills for wellness and recovery  Therapeutic Interventions: Assess for all discharge needs, 1 to 1 time with Social worker, Explore available resources and support systems, Assess for adequacy in community support network, Educate family and significant other(s) on suicide prevention, Complete Psychosocial Assessment, Interpersonal group therapy.  Evaluation of Outcomes: Progressing   Progress in Treatment: Attending groups: Yes. Participating in groups: Yes. Taking medication as prescribed: Yes. Toleration medication: Yes. Family/Significant other contact made: Yes, individual(s) contacted:  once permission is given. Patient understands diagnosis: Yes. Discussing patient identified problems/goals with staff: Yes. Medical problems stabilized or resolved: Yes. Denies suicidal/homicidal ideation: Yes. Issues/concerns per patient self-inventory: No. Other:   New problem(s) identified: No, Describe:  None  New Short Term/Long Term Goal(s):Marland Kitchen Medication stabilization, elimination of SI thoughts, and development of a comprehensive mental wellness plan.   Patient Goals:  "I want to get better and go home and take care of myself  better"   Discharge Plan or Barriers: Patient and pt's daughter want the patient to go to an assisted living facility which is not going to happen. Patient will most likely be discharged home and will follow up with [facility] Dr. Orland Mustard. Update 01/09/2020:  Pt reports that she would like a referral for home health, however, CSW staff referred for CST following a discussion with patient, NP and CSW staff.  Pt was agreeable to this referral.    Reason for Continuation of Hospitalization: Anxiety Depression Medication stabilization  Estimated Length of Stay:  TBD  Attendees: Patient:  01/09/2020   Physician: Dr. Weber Cooks, MD 01/09/2020   Nursing:  01/09/2020  RN Care Manager: 01/09/2020   Social Worker: Assunta Curtis, [place]  01/09/2020   Recreational Therapist:  01/09/2020   Other:  01/09/2020   Other:  01/09/2020   Other: 01/09/2020     Scribe for Treatment Team: Rozann Lesches, LCSW 01/09/2020 1:43 PM

## 2020-01-09 NOTE — Progress Notes (Signed)
[facility] MD Progress Note  01/09/2020 2:47 PM Leslie Wong  MRN:  [id number]   Subjective: Follow-up for this patient diagnosed with bipolar 1 disorder.  Patient reports that she is doing very well today.  Patient denies having any suicidal or homicidal ideations and denies any hallucinations.  However, patient then reports that her depression is at an 8 out of 10 and her anxiety is at an 8 out of 10 on a scale of 0-10 with 10 being the worst.  Patient reports that she is coming out of her room so that she can go to the group in the craft room today.  Patient reports that her sleep has been good and that her appetite has been okay.  She denies any medication side effects and feels that the medication is helping her.  She states that she is looking forward to discharging tomorrow.  Principal Problem: Bipolar I disorder, most recent episode depressed ([place]) Diagnosis: Principal Problem:   Bipolar I disorder, most recent episode depressed ([place]) Active Problems:   MDD (major depressive disorder)  Total Time spent with patient: 20 minutes  Past Psychiatric History: Patient has several psychiatric hospitalizations in the past.  Her last psychiatric admission to our facility was in June of this year.  She has previously been diagnosed with opiate dependence on high-dose opiates and other drugs.  Patient has appeared at times have a very difficult time functioning outside of the hospital.  She had just been in the hospital a month prior to the June admission.  She had been in reasonable condition at that time.  Past Medical History:  Past Medical History:  Diagnosis Date  . Back pain   . Cancer ([place])    skin  . Depression    BAD  . Gastroparesis   . GERD (gastroesophageal reflux disease)   . Hypothyroidism   . Insomnia   . Migraines   . Parkinson disease ([place])    per [facility], dx'd 2019  . Recurrent cold sores   . Shoulder bursitis   . Urge incontinence     Past Surgical History:   Procedure Laterality Date  . ABDOMINAL HYSTERECTOMY  1999   total  . APPENDECTOMY    . ESOPHAGOGASTRODUODENOSCOPY  01/2006   negative except small hiatal hernia  . ESOPHAGOGASTRODUODENOSCOPY  02/24/2015   See report  . LAPAROSCOPY     for endometriosis  . OVARIAN CYST REMOVAL  1990   unilateral  . TONSILLECTOMY AND ADENOIDECTOMY     Family History:  Family History  Problem Relation Age of Onset  . Hypertension Mother   . Arthritis Mother   . Diabetes Mother   . Stroke Mother   . Cancer Father        lung  . Colon cancer Neg Hx   . Breast cancer Neg Hx    Family Psychiatric  History: None reported Social History:  Social History   Substance and Sexual Activity  Alcohol Use No  . Alcohol/week: 0.0 standard drinks     Social History   Substance and Sexual Activity  Drug Use No    Social History   Socioeconomic History  . Marital status: Divorced    Spouse name: Not on file  . Number of children: Not on file  . Years of education: Not on file  . Highest education level: Not on file  Occupational History  . Not on file  Tobacco Use  . Smoking status: Current Some Day Smoker  Packs/day: 1.00    Years: 42.50    Pack years: 42.50    Types: Cigarettes  . Smokeless tobacco: Former Systems developer  . Tobacco comment: declined  Substance and Sexual Activity  . Alcohol use: No    Alcohol/week: 0.0 standard drinks  . Drug use: No  . Sexual activity: Never  Other Topics Concern  . Not on file  Social History Narrative   Lives alone.     Has a dog names Dixie   Social Determinants of Health   Financial Resource Strain:   . Difficulty of Paying Living Expenses: Not on file  Food Insecurity:   . Worried About Charity fundraiser in the Last Year: Not on file  . Ran Out of Food in the Last Year: Not on file  Transportation Needs:   . Lack of Transportation (Medical): Not on file  . Lack of Transportation (Non-Medical): Not on file  Physical Activity:   . Days of  Exercise per Week: Not on file  . Minutes of Exercise per Session: Not on file  Stress:   . Feeling of Stress : Not on file  Social Connections:   . Frequency of Communication with Friends and Family: Not on file  . Frequency of Social Gatherings with Friends and Family: Not on file  . Attends Religious Services: Not on file  . Active Member of Clubs or Organizations: Not on file  . Attends Archivist Meetings: Not on file  . Marital Status: Not on file   Additional Social History:                         Sleep: Good  Appetite:  Good  Current Medications: Current Facility-Administered Medications  Medication Dose Route Frequency Provider Last Rate Last Admin  . albuterol (VENTOLIN HFA) 108 (90 Base) MCG/ACT inhaler 2 puff  2 puff Inhalation Q6H PRN Sharma Covert, MD      . alum & mag hydroxide-simeth (MAALOX/MYLANTA) 200-200-20 MG/5ML suspension 30 mL  30 mL Oral Q4H PRN Sharma Covert, MD   30 mL at 01/08/20 1716  . ascorbic acid (VITAMIN C) tablet 500 mg  500 mg Oral Daily Sharma Covert, MD   500 mg at 01/09/20 0753  . buPROPion [facility]) tablet 75 mg  75 mg Oral Daily Sharma Covert, MD   75 mg at 01/09/20 0756  . cephALEXin (KEFLEX) capsule 500 mg  500 mg Oral Q8H Sharma Covert, MD   500 mg at 01/09/20 1030  . cholecalciferol (VITAMIN D) tablet 1,000 Units  1,000 Units Oral Daily Sharma Covert, MD   1,000 Units at 01/09/20 0754  . feeding supplement (ENSURE ENLIVE) (ENSURE ENLIVE) liquid 237 mL  237 mL Oral TID BM Cristofano, Paul A, MD   237 mL at 01/09/20 1030  . haloperidol lactate (HALDOL) injection 2 mg  2 mg Intramuscular Q6H PRN Sharma Covert, MD      . lamoTRIgine (LAMICTAL) tablet 50 mg  50 mg Oral Daily Sharma Covert, MD   50 mg at 01/09/20 0754  . levothyroxine (SYNTHROID) tablet 88 mcg  88 mcg Oral Daily Sharma Covert, MD   88 mcg at 01/09/20 0644  . LORazepam (ATIVAN) tablet 1 mg  1 mg Oral Q6H PRN  Sharma Covert, MD   1 mg at 01/08/20 2124  . magnesium hydroxide (MILK OF MAGNESIA) suspension 30 mL  30 mL Oral Daily PRN Myles Lipps  Kellie Simmering, MD      . multivitamin with minerals tablet 1 tablet  1 tablet Oral Daily Cristofano, Dorene Ar, MD   1 tablet at 01/09/20 0755  . OLANZapine (ZYPREXA) tablet 20 mg  20 mg Oral QHS Sharma Covert, MD   20 mg at 01/08/20 2124  . oxyCODONE (Oxy IR/ROXICODONE) immediate release tablet 5 mg  5 mg Oral Q12H PRN Shakinah Navis, Lowry Ram, FNP   5 mg at 01/09/20 1201  . pantoprazole (PROTONIX) EC tablet 40 mg  40 mg Oral Daily Sharma Covert, MD   40 mg at 01/09/20 0754  . propranolol (INDERAL) tablet 10 mg  10 mg Oral BID Sharma Covert, MD   10 mg at 01/08/20 1714  . senna (SENOKOT) tablet 8.6 mg  1 tablet Oral Daily PRN Sharma Covert, MD      . sucralfate (CARAFATE) tablet 1 g  1 g Oral QID Sharma Covert, MD   1 g at 01/09/20 1201  . vitamin B-12 (CYANOCOBALAMIN) tablet 1,000 mcg  1,000 mcg Oral Daily Sharma Covert, MD   1,000 mcg at 01/09/20 [id number]    Lab Results: No results found for this or any previous visit (from the past 10 hour(s)).  Blood Alcohol level:  Lab Results  Component Value Date   ETH <10 12/20/2019   ETH <10 123XX123    Metabolic Disorder Labs: Lab Results  Component Value Date   HGBA1C 5.4 10/13/2018   MPG 108.28 10/13/2018   MPG 111.15 08/29/2018   No results found for: PROLACTIN Lab Results  Component Value Date   CHOL 182 01/03/2020   TRIG 278 (H) 01/03/2020   HDL 33 (L) 01/03/2020   CHOLHDL 5.5 01/03/2020   VLDL 56 (H) 01/03/2020   LDLCALC 93 01/03/2020   LDLCALC 103 (H) 10/13/2018    Physical Findings: AIMS:  , ,  ,  ,    CIWA:    COWS:     Musculoskeletal: Strength & Muscle Tone: within normal limits Gait & Station: normal Patient leans: N/A  Psychiatric Specialty Exam: Physical Exam  Nursing note and vitals reviewed. Constitutional: She is oriented to person, place, and time. She  appears well-developed and well-nourished.  Cardiovascular: Normal rate.  Respiratory: Effort normal.  Musculoskeletal:        General: Normal range of motion.  Neurological: She is alert and oriented to person, place, and time.  Skin: Skin is warm.    Review of Systems  Constitutional: Negative.   HENT: Negative.   Eyes: Negative.   Respiratory: Negative.   Cardiovascular: Negative.   Gastrointestinal: Negative.   Genitourinary: Negative.   Musculoskeletal: Negative.   Skin: Negative.   Neurological: Negative.   Psychiatric/Behavioral: The patient is nervous/anxious.     Blood pressure 106/78, pulse 94, temperature 98 F (36.7 C), temperature source Oral, resp. rate 17, height 4\' 11"  (1.499 m), weight 42.2 kg, SpO2 99 %.Body mass index is 18.78 kg/m.  General Appearance: Casual and Fairly Groomed  Eye Contact:  Good  Speech:  Clear and Coherent and Normal Rate  Volume:  Normal  Mood:  Anxious and Depressed  Affect:  Congruent  Thought Process:  Coherent and Descriptions of Associations: Intact  Orientation:  Full (Time, Place, and Person)  Thought Content:  WDL  Suicidal Thoughts:  No  Homicidal Thoughts:  No  Memory:  Immediate;   Good Recent;   Good Remote;   Good  Judgement:  Fair  Insight:  Fair  Psychomotor Activity:  Normal  Concentration:  Concentration: Fair  Recall:  [employer] of Knowledge:  Fair  Language:  Good  Akathisia:  No  Handed:  Right  AIMS (if indicated):     Assets:  Communication Skills Desire for Improvement Financial Resources/Insurance Housing Resilience Social Support Transportation  ADL's:  Intact  Cognition:  WNL  Sleep:  Number of Hours: 7   Assessment: Patient presents in her room coming out to go to group.  Patient is pleasant, calm, and cooperative during the time of evaluation.  Patient does not appear to be depressed and is been also reporting to other staff members that she is feeling great and things are in going well  for her.  Patient does not appear to be anxious either but continues reporting elevated levels of depression and anxiety.  She has continued denying having any suicidal homicidal ideations and reported looking forward to being discharged tomorrow with no concerns about leaving the hospital.  Patient will continue current medications at this time.  Patient did have a CST interview through [facility] this morning and [facility] stated they were going to refer her to their ACT team.  Treatment Plan Summary: Daily contact with patient to assess and evaluate symptoms and progress in treatment and Medication management Continue Wellbutrin 75 mg p.o. daily for depression Continue Lamictal 50 mg p.o. daily for mood stability Continue Synthroid 88 mcg p.o. daily for hypothyroidism Continue Zyprexa 20 mg p.o. daily for bipolar 1 Continue propanolol 10 mg p.o. twice daily Encourage group therapy participation Continue every 15 minute safety checks Plan discharge for tomorrow.  [place], FNP 01/09/2020, 2:47 PM

## 2020-01-09 NOTE — Progress Notes (Signed)
I encouraged the pt to take a shower. I got her set up and she did. I changed the bed linens. Collier Bullock RN

## 2020-01-09 NOTE — Progress Notes (Signed)
Pt had interview with [facility] for CST referral at 9am this morning.   CSW received follow up call from [place] with [facility] asking for med list and diagnosis to be faxed over. CSW faxed information, fax successful. Debbie reported she is going to refer pt for ACT services.  Evalina Field, MSW, LCSW Clinical Social Work 01/09/2020 10:14 AM

## 2020-01-09 NOTE — Progress Notes (Signed)
Patient alert and oriented x4, her affect is flat but she brightens upon approach she denies SI/HI/AVH, herthoughts are organized and coherent, she isinteracting with peersand staff appropriately, she appears less anxious, less intrusive she was receptive to staff, took her scheduled evening medication and she attended evening wrap up group. 15 minutes safety checks maintained will continue to closely monitor.

## 2020-01-09 NOTE — Plan of Care (Signed)
Pt rates depression, anxiety and hopelessness all 8/10 on patient self inventory. Pt denies SI, HI and AVH. Pt was educated on care plan and verbalizes understanding. Vannia Pola RN Problem: Education: Goal: Knowledge of [facility] Education information/materials will improve Outcome: Progressing Goal: Emotional status will improve Outcome: Progressing Goal: Mental status will improve Outcome: Progressing Goal: Verbalization of understanding the information provided will improve Outcome: Progressing   Problem: Activity: Goal: Interest or engagement in activities will improve Outcome: Progressing Goal: Sleeping patterns will improve Outcome: Progressing   Problem: Coping: Goal: Ability to verbalize frustrations and anger appropriately will improve Outcome: Progressing Goal: Ability to demonstrate self-control will improve Outcome: Progressing   Problem: Health Behavior/Discharge Planning: Goal: Identification of resources available to assist in meeting health care needs will improve Outcome: Progressing Goal: Compliance with treatment plan for underlying cause of condition will improve Outcome: Progressing   Problem: Physical Regulation: Goal: Ability to maintain clinical measurements within normal limits will improve Outcome: Progressing   Problem: Safety: Goal: Periods of time without injury will increase Outcome: Progressing   Problem: Education: Goal: Utilization of techniques to improve thought processes will improve Outcome: Progressing Goal: Knowledge of the prescribed therapeutic regimen will improve Outcome: Progressing   Problem: Coping: Goal: Coping ability will improve Outcome: Progressing Goal: Will verbalize feelings Outcome: Progressing   Problem: Health Behavior/Discharge Planning: Goal: Ability to make decisions will improve Outcome: Progressing Goal: Compliance with therapeutic regimen will improve Outcome: Progressing

## 2020-01-09 NOTE — Progress Notes (Addendum)
Recreation Therapy Notes  Date: 01/09/2020  Time: 9:30 am  Location: Craft room  Behavioral response: Appropriate  Intervention Topic: Goals   Discussion/Intervention:  Group content on today was focused on goals. Patients described what goals are and how they define goals. Individuals expressed how they go about setting goals and reaching them. The group identified how important goals are and if they make short term goals to reach long term goals. Patients described how many goals they work on at a time and what affects them not reaching their goal. Individuals described how much time they put into planning and obtaining their goals. The group participated in the intervention "My Goal Board" and made personal goal boards to help them achieve their goal. Clinical Observations/Feedback:  Patient came to group late due to having a phone interview. She stated that her goal is to spend more time with her grandchildren. Individual participated in the intervention during group and was social with peers and staff.  Leslie Wong LRT/CTRS         [name] 01/09/2020 11:36 AM

## 2020-01-10 MED ORDER — SUCRALFATE 1 G PO TABS: 1.0000 g | ORAL_TABLET | Freq: Four times a day (QID) | ORAL | 1 refills | Status: DC

## 2020-01-10 MED ORDER — BUPROPION HCL 75 MG PO TABS: 75.0000 mg | ORAL_TABLET | Freq: Every day | ORAL | 1 refills | Status: DC

## 2020-01-10 MED ORDER — LAMOTRIGINE 25 MG PO TABS: 50.0000 mg | ORAL_TABLET | Freq: Every day | ORAL | 1 refills | Status: DC

## 2020-01-10 MED ORDER — PROPRANOLOL HCL 10 MG PO TABS: 10.0000 mg | ORAL_TABLET | Freq: Two times a day (BID) | ORAL | 1 refills | Status: DC

## 2020-01-10 MED ORDER — LEVOTHYROXINE SODIUM 88 MCG PO TABS: 88.0000 ug | ORAL_TABLET | Freq: Every day | ORAL | 1 refills | Status: DC

## 2020-01-10 MED ORDER — OLANZAPINE 20 MG PO TABS: 20.0000 mg | ORAL_TABLET | Freq: Every day | ORAL | 1 refills | Status: DC

## 2020-01-10 MED ORDER — VITAMIN D3 25 MCG (1000 UNIT) PO TABS: 1000.0000 [IU] | ORAL_TABLET | Freq: Every day | ORAL | 1 refills | Status: DC

## 2020-01-10 MED ORDER — ASCORBIC ACID 500 MG PO TABS: 500.0000 mg | ORAL_TABLET | Freq: Every day | ORAL | 1 refills | Status: DC

## 2020-01-10 NOTE — Progress Notes (Signed)
Recreation Therapy Notes  INPATIENT RECREATION THERAPY ASSESSMENT  Patient Details Name: Leslie Wong MRN: [id number] DOB: 09-Oct-1956 Today's Date: 01/10/2020       Information Obtained From: Patient  Able to Participate in Assessment/Interview: Yes  Patient Presentation: Responsive  Reason for Admission (Per Patient): Active Symptoms, Med Non-Compliance, Other (Comments)(Depression)  Patient Stressors:    Coping Skills:   Talk  Leisure Interests (2+):  Social - Family, Music - Listen  Frequency of Recreation/Participation: Monthly  Awareness of Community Resources:     Community Resources:     Current Use:    If no, Barriers?:    Expressed Interest in [place] of Residence:  Insurance underwriter  Patient Main Form of Transportation: Other (Comment)(My daughter)  Patient Strengths:  N/A  Patient Identified Areas of Improvement:  N/A  Patient Goal for Hospitalization:  To get well and put my weight back on and take my meds  Current SI (including self-harm):  No  Current HI:  No  Current AVH: No  Staff Intervention Plan: Group Attendance, Collaborate with Interdisciplinary Treatment Team  Consent to Intern Participation: N/A  Dayson Aboud 01/10/2020, 1:09 PM

## 2020-01-10 NOTE — Progress Notes (Signed)
Recreation Therapy Notes  INPATIENT RECREATION TR PLAN  Patient Details Name: Leslie Wong MRN: [id number] DOB: 01/20/1956 Today's Date: 01/10/2020  Rec Therapy Plan Is patient appropriate for Therapeutic Recreation?: Yes Treatment times per week: at least 3 Estimated Length of Stay: 5-7 days TR Treatment/Interventions: Group participation (Comment)  Discharge Criteria Pt will be discharged from therapy if:: Discharged Treatment plan/goals/alternatives discussed and agreed upon by:: Patient/family  Discharge Summary Short term goals set: Patient will engage in groups without prompting or encouragement from LRT x3 group sessions within 5 recreation therapy group sessions Short term goals met: Complete Progress toward goals comments: Groups attended Which groups?: Goal setting, Other (Comment)(Strengths, Relaxation, Necessities, Happiness) Reason goals not met: N/A Therapeutic equipment acquired: N/A Reason patient discharged from therapy: Discharge from hospital Pt/family agrees with progress & goals achieved: Yes Date patient discharged from therapy: 01/10/20   [name] 01/10/2020, 1:09 PM

## 2020-01-10 NOTE — Plan of Care (Signed)
Pt rates depression and anxiety both 7/10. Pt denies SI, HI and AVH. Pt was educated on care plan and verbalizes understanding. Collier Bullock RN Problem: Education: Goal: Knowledge of [facility] Education information/materials will improve Outcome: Adequate for Discharge Goal: Emotional status will improve Outcome: Adequate for Discharge Goal: Mental status will improve Outcome: Adequate for Discharge Goal: Verbalization of understanding the information provided will improve Outcome: Adequate for Discharge   Problem: Activity: Goal: Interest or engagement in activities will improve Outcome: Adequate for Discharge Goal: Sleeping patterns will improve Outcome: Adequate for Discharge   Problem: Coping: Goal: Ability to verbalize frustrations and anger appropriately will improve Outcome: Adequate for Discharge Goal: Ability to demonstrate self-control will improve Outcome: Adequate for Discharge   Problem: Health Behavior/Discharge Planning: Goal: Identification of resources available to assist in meeting health care needs will improve Outcome: Adequate for Discharge Goal: Compliance with treatment plan for underlying cause of condition will improve Outcome: Adequate for Discharge   Problem: Physical Regulation: Goal: Ability to maintain clinical measurements within normal limits will improve Outcome: Adequate for Discharge   Problem: Safety: Goal: Periods of time without injury will increase Outcome: Adequate for Discharge   Problem: Education: Goal: Utilization of techniques to improve thought processes will improve Outcome: Adequate for Discharge Goal: Knowledge of the prescribed therapeutic regimen will improve Outcome: Adequate for Discharge   Problem: Coping: Goal: Coping ability will improve Outcome: Adequate for Discharge Goal: Will verbalize feelings Outcome: Adequate for Discharge   Problem: Health Behavior/Discharge Planning: Goal: Ability to make  decisions will improve Outcome: Adequate for Discharge Goal: Compliance with therapeutic regimen will improve Outcome: Adequate for Discharge

## 2020-01-10 NOTE — Discharge Summary (Addendum)
Physician Discharge Summary Note  Patient:  Leslie Wong is an 64 y.o., female MRN:  [id number] DOB:  02-05-1956 Patient phone:  [phone] (home)  Patient address:   [place] [address],  Total Time spent with patient: 30 minutes  Date of Admission:  01/02/2020 Date of Discharge: 01/10/20  Reason for Admission:  64 year old female with a past psychiatric history significant for bipolar disorder who was brought to the [facility] emergency department on 12/30/2019 by her daughter because "a lot of catastrophic events have occurred".   Principal Problem: Bipolar I disorder, most recent episode depressed [facility]) Discharge Diagnoses: Principal Problem:   Bipolar I disorder, most recent episode depressed ([place]) Active Problems:   MDD (major depressive disorder)   Past Psychiatric History: Patient has several psychiatric hospitalizations in the past.  Her last psychiatric admission to our facility was in June of this year.  She has previously been diagnosed with opiate dependence on high-dose opiates and other drugs.  Patient has appeared at times have a very difficult time functioning outside of the hospital.  She had just been in the hospital a month prior to the June admission.  She had been in reasonable condition at that time.  Past Medical History:  Past Medical History:  Diagnosis Date  . Back pain   . Cancer ([place])    skin  . Depression    BAD  . Gastroparesis   . GERD (gastroesophageal reflux disease)   . Hypothyroidism   . Insomnia   . Migraines   . Parkinson disease ([place])    per [place] [facility], dx'd 2019  . Recurrent cold sores   . Shoulder bursitis   . Urge incontinence     Past Surgical History:  Procedure Laterality Date  . ABDOMINAL HYSTERECTOMY  1999   total  . APPENDECTOMY    . ESOPHAGOGASTRODUODENOSCOPY  01/2006   negative except small hiatal hernia  . ESOPHAGOGASTRODUODENOSCOPY  02/24/2015   See report   . LAPAROSCOPY     for endometriosis  . OVARIAN CYST REMOVAL  1990   unilateral  . TONSILLECTOMY AND ADENOIDECTOMY     Family History:  Family History  Problem Relation Age of Onset  . Hypertension Mother   . Arthritis Mother   . Diabetes Mother   . Stroke Mother   . Cancer Father        lung  . Colon cancer Neg Hx   . Breast cancer Neg Hx    Family Psychiatric  History: None reported Social History:  Social History   Substance and Sexual Activity  Alcohol Use No  . Alcohol/week: 0.0 standard drinks     Social History   Substance and Sexual Activity  Drug Use No    Social History   Socioeconomic History  . Marital status: Divorced    Spouse name: Not on file  . Number of children: Not on file  . Years of education: Not on file  . Highest education level: Not on file  Occupational History  . Not on file  Tobacco Use  . Smoking status: Current Some Day Smoker    Packs/day: 1.00    Years: 42.50    Pack years: 42.50    Types: Cigarettes  . Smokeless tobacco: Former Systems developer  . Tobacco comment: declined  Substance and Sexual Activity  . Alcohol use: No    Alcohol/week: 0.0 standard drinks  . Drug use: No  . Sexual activity: Never  Other Topics Concern  . Not on file  Social History Narrative   Lives alone.     Has a dog names Dixie   Social Determinants of Health   Financial Resource Strain:   . Difficulty of Paying Living Expenses: Not on file  Food Insecurity:   . Worried About Charity fundraiser in the Last Year: Not on file  . Ran Out of Food in the Last Year: Not on file  Transportation Needs:   . Lack of Transportation (Medical): Not on file  . Lack of Transportation (Non-Medical): Not on file  Physical Activity:   . Days of Exercise per Week: Not on file  . Minutes of Exercise per Session: Not on file  Stress:   . Feeling of Stress : Not on file  Social Connections:   . Frequency of Communication with Friends and Family: Not on file  .  Frequency of Social Gatherings with Friends and Family: Not on file  . Attends Religious Services: Not on file  . Active Member of Clubs or Organizations: Not on file  . Attends Archivist Meetings: Not on file  . Marital Status: Not on file    Hospital Course:  Patient remained on the [facility] unit for 7 days. The patient stabilized on medication and therapy. Patient was discharged on Ventolin HFA 2 puffs every 6 hours as needed for wheezing or shortness of breath, Wellbutrin SR 25 mg p.o. daily, Lamictal 50 mg p.o. daily, Synthroid 88 mcg p.o. daily, Zyprexa 20 mg p.o. nightly, propanolol 10 mg p.o. twice daily. Patient has shown improvement with improved mood, affect, sleep, appetite, and interaction. Patient has attended group and participated. Patient has been seen in the day room interacting with peers and staff appropriately. Patient denies any SI/HI/AVH and contracts for safety. Patient agrees to follow up at [place]. Patient is provided with prescriptions for their medications upon discharge.  Patient has continued reporting having depression and anxiety but continues to state that this is baseline chronic and never completely goes away, however, patient does not present to be anxious or depressed.  Patient has been talking to numerous people clearing staff and other peers has been attending group and being in the day room interacting with others appropriately.  Patient does state that she is feeling that she is ready for discharge today.  Physical Findings: AIMS:  , ,  ,  ,    CIWA:    COWS:     Musculoskeletal: Strength & Muscle Tone: within normal limits Gait & Station: normal Patient leans: N/A  Psychiatric Specialty Exam: Physical Exam  Nursing note and vitals reviewed. Constitutional: She is oriented to person, place, and time. She appears well-developed and well-nourished.  Cardiovascular: Normal rate.  Respiratory: Effort  normal.  Musculoskeletal:        General: Normal range of motion.  Neurological: She is alert and oriented to person, place, and time.  Skin: Skin is warm.    Review of Systems  Constitutional: Negative.   HENT: Negative.   Eyes: Negative.   Respiratory: Negative.   Cardiovascular: Negative.   Gastrointestinal: Negative.   Genitourinary: Negative.   Musculoskeletal: Negative.   Skin: Negative.   Neurological: Negative.   Psychiatric/Behavioral: The patient is nervous/anxious.        Reports depression, but this is chronic    Blood pressure 107/81, pulse 84, temperature 98.6 F (37 C), temperature source Oral, resp. rate 17, height 4\' 11"  (1.499  m), weight 42.2 kg, SpO2 98 %.Body mass index is 18.78 kg/m.   General Appearance: Casual  Eye Contact::  Good  Speech:  Clear and Coherent409  Volume:  Normal  Mood:  Euthymic  Affect:  Constricted  Thought Process:  Coherent  Orientation:  Full (Time, Place, and Person)  Thought Content:  Logical  Suicidal Thoughts:  No  Homicidal Thoughts:  No  Memory:  Immediate;   Fair Recent;   Fair Remote;   Fair  Judgement:  Fair  Insight:  Fair  Psychomotor Activity:  Normal  Concentration:  Fair  Recall:  [place]  Language: Fair  Akathisia:  No  Handed:  Right  AIMS (if indicated):     Assets:  Desire for Improvement Housing Physical Health Resilience Social Support  Sleep:  Number of Hours: 6.75  Cognition: WNL  ADL's:  Intact   Have you used any form of tobacco in the last 30 days? (Cigarettes, Smokeless Tobacco, Cigars, and/or Pipes): Yes  Has this patient used any form of tobacco in the last 30 days? (Cigarettes, Smokeless Tobacco, Cigars, and/or Pipes) Yes, Yes, A prescription for an FDA-approved tobacco cessation medication was offered at discharge and the patient refused  Blood Alcohol level:  Lab Results  Component Value Date   [facility] <10 12/20/2019   ETH <10 [id number]    Metabolic Disorder  Labs:  Lab Results  Component Value Date   HGBA1C 5.4 10/13/2018   MPG 108.28 10/13/2018   MPG 111.15 08/29/2018   No results found for: PROLACTIN Lab Results  Component Value Date   CHOL 182 01/03/2020   TRIG 278 (H) 01/03/2020   HDL 33 (L) 01/03/2020   CHOLHDL 5.5 01/03/2020   VLDL 56 (H) 01/03/2020   LDLCALC 93 01/03/2020   LDLCALC 103 (H) 10/13/2018    See Psychiatric Specialty Exam and Suicide Risk Assessment completed by Attending Physician prior to discharge.  Discharge destination:  Home  Is patient on multiple antipsychotic therapies at discharge:  No   Has Patient had three or more failed trials of antipsychotic monotherapy by history:  No  Recommended Plan for Multiple Antipsychotic Therapies: NA  Discharge Instructions    Diet - low sodium heart healthy   Complete by: As directed    Increase activity slowly   Complete by: As directed      Allergies as of 01/10/2020      Reactions   Doxepin Other (See Comments)   Pt reports "that made me have seizures"   Valproic Acid Shortness Of Breath   Aspirin    REACTION: UNSPECIFIED   Atorvastatin Other (See Comments)   Tongue swelling, legs cramping   Baclofen    REACTION: Throat swells up   Codeine    REACTION: UNSPECIFIED   Duloxetine Other (See Comments)   Vision loss, weight loss   Erythromycin    REACTION: UNSPECIFIED   Gabapentin    REACTION: throat swells up   Metoclopramide Hcl    REACTION: states "messed me up"   Nortriptyline    Other reaction(s): Other (See Comments) Vision issues   Nsaids Other (See Comments)   Stomach problems   Nucynta Er [tapentadol Hcl Er]    Intolerant, see note from 07/24/12.     Nucynta [tapentadol]    AMS   Olanzapine Nausea Only   Prednisone    GI intolance   Pregabalin    Seroquel [quetiapine Fumerate] Other (See Comments)   Loss control, felt like on another  planet   Sulfa Antibiotics Nausea And Vomiting   Topamax Other (See Comments)   Blisters in mouth  and tongue   Vicodin [hydrocodone-acetaminophen]    Nausea, thrush and constipation      Medication List    STOP taking these medications   cephALEXin 500 MG capsule Commonly known as: KEFLEX   doxepin 25 MG capsule Commonly known as: SINEQUAN   [facility] Mouthwash Susp   Fish Oil 1000 MG Caps   fluocinonide cream 0.05 % Commonly known as: LIDEX   hydrocerin Crea   hydrOXYzine 50 MG tablet Commonly known as: ATARAX/VISTARIL   lidocaine 2 % solution Commonly known as: XYLOCAINE   omeprazole 40 MG capsule Commonly known as: PRILOSEC   ondansetron 4 MG disintegrating tablet Commonly known as: ZOFRAN-ODT   tiZANidine 4 MG tablet Commonly known as: ZANAFLEX   Vitamin D (Ergocalciferol) 1.25 MG (50000 UNIT) Caps capsule Commonly known as: DRISDOL     TAKE these medications     Indication  albuterol 108 (90 Base) MCG/ACT inhaler Commonly known as: VENTOLIN HFA Inhale 2 puffs into the lungs every 6 (six) hours as needed for wheezing or shortness of breath.  Indication: Chronic Obstructive Lung Disease   ascorbic acid 500 MG tablet Commonly known as: VITAMIN C Take 1 tablet (500 mg total) by mouth daily.  Indication: Inadequate Vitamin C   buPROPion 75 MG tablet Commonly known as: WELLBUTRIN Take 1 tablet (75 mg total) by mouth daily. Start taking on: January 11, 2020  Indication: Major Depressive Disorder   cholecalciferol 25 MCG (1000 UNIT) tablet Commonly known as: VITAMIN D Take 1 tablet (1,000 Units total) by mouth daily.  Indication: osteopenia   famotidine 20 MG tablet Commonly known as: PEPCID Take 1 tablet (20 mg total) by mouth 2 (two) times daily.  Indication: Heartburn   lamoTRIgine 25 MG tablet Commonly known as: LAMICTAL Take 2 tablets (50 mg total) by mouth daily.  Indication: Manic-Depression, Mood   levothyroxine 88 MCG tablet Commonly known as: SYNTHROID Take 1 tablet (88 mcg total) by mouth daily. Start taking on: January 11, 2020 What changed: See the new instructions.  Indication: Underactive Thyroid   OLANZapine 20 MG tablet Commonly known as: ZYPREXA Take 1 tablet (20 mg total) by mouth at bedtime. What changed:   medication strength  how much to take  when to take this  Indication: Manic Phase of Manic-Depression   propranolol 10 MG tablet Commonly known as: INDERAL Take 1 tablet (10 mg total) by mouth 2 (two) times daily.  Indication: Feeling Anxious   Senokot 8.6 MG tablet Generic drug: senna Take 2 tablets by mouth daily.  Indication: Constipation   sucralfate 1 g tablet Commonly known as: Carafate Take 1 tablet (1 g total) by mouth 4 (four) times daily.  Indication: Ulcer of the Duodenum   valACYclovir 500 MG tablet Commonly known as: VALTREX TAKE 1 TABLET(500 MG) BY MOUTH TWICE DAILY  Indication: Per PCP   vitamin B-12 1000 MCG tablet Commonly known as: CYANOCOBALAMIN Take 1 tablet (1,000 mcg total) by mouth daily.  Indication: Inadequate Vitamin B12      Follow-up Information    [facility] Follow up on 01/16/2020.   Why: You have an appointment scheduled for 01/16/20 at 1:20pm. Thank You! Contact information: [place] [address] [phone]        Services, Psychotherapeutic Follow up.   Why: [facility] will contact you to schedule a phone assessment for ACT services. Thank  You! Contact information: [address] [place] [address] [phone]           Follow-up recommendations:  Continue activity as tolerated. Continue diet as recommended by your PCP. Ensure to keep all appointments with outpatient providers.  Comments:  Patient is instructed prior to discharge to: Take all medications as prescribed by his/her mental healthcare provider. Report any adverse effects and or reactions from the medicines to his/her outpatient provider promptly. Patient has been instructed & cautioned: To not engage in alcohol and  or illegal drug use while on prescription medicines. In the event of worsening symptoms, patient is instructed to call the crisis hotline, 911 and or go to the nearest ED for appropriate evaluation and treatment of symptoms. To follow-up with his/her primary care provider for your other medical issues, concerns and or health care needs.    Signed: [place], FNP 01/10/2020, 9:25 AM

## 2020-01-10 NOTE — Plan of Care (Signed)
  Problem: Group Participation Goal: STG - Patient will engage in groups without prompting or encouragement from LRT x3 group sessions within 5 recreation therapy group sessions Description: STG - Patient will engage in groups without prompting or encouragement from LRT x3 group sessions within 5 recreation therapy group sessions Outcome: Completed/Met

## 2020-01-10 NOTE — BHH Suicide Risk Assessment (Signed)
[place] INPATIENT:  Family/Significant Other Suicide Prevention Education  Suicide Prevention Education:  Education Completed; Clarita Leber, daughter [id number] has been identified by the patient as the family member/significant other with whom the patient will be residing, and identified as the person(s) who will aid the patient in the event of a mental health crisis (suicidal ideations/suicide attempt).  With written consent from the patient, the family member/significant other has been provided the following suicide prevention education, prior to the and/or following the discharge of the patient.  The suicide prevention education provided includes the following:  Suicide risk factors  Suicide prevention and interventions  National Suicide Hotline telephone number  [facility] assessment telephone number  [facility] Emergency Assistance [place] and/or Residential Mobile Crisis Unit telephone number  Request made of family/significant other to:  Remove weapons (e.g., guns, rifles, knives), all items previously/currently identified as safety concern.    Remove drugs/medications (over-the-counter, prescriptions, illicit drugs), all items previously/currently identified as a safety concern.  The family member/significant other verbalizes understanding of the suicide prevention education information provided.  The family member/significant other agrees to remove the items of safety concern listed above.  CSW spoke with pts daughter [name] who reported that pt is in the hospital due to self-neglect. Per [place], pt was not in a good space mentally with her depression and was not eating like she was supposed to. [name] expressed no concerns for SI/HI and stated there are no guns in pts home. [name] expressed no concerns with pt returning home and was agreeable to pick pt up at discharge today.  [place] MSW LCSW 01/10/2020, 9:11 AM

## 2020-01-10 NOTE — BHH Suicide Risk Assessment (Signed)
[facility] Discharge Suicide Risk Assessment   Principal Problem: Bipolar I disorder, most recent episode depressed (Leslie Wong) Discharge Diagnoses: Principal Problem:   Bipolar I disorder, most recent episode depressed (Leslie Wong) Active Problems:   MDD (major depressive disorder)   Total Time spent with patient: 30 minutes  Musculoskeletal: Strength & Muscle Tone: within normal limits Gait & Station: normal Patient leans: N/A  Psychiatric Specialty Exam: Review of Systems  Constitutional: Negative.   HENT: Negative.   Eyes: Negative.   Respiratory: Negative.   Cardiovascular: Negative.   Gastrointestinal: Negative.   Musculoskeletal: Negative.   Skin: Negative.   Neurological: Negative.   Psychiatric/Behavioral: Negative.     Blood pressure 107/81, pulse 84, temperature 98.6 F (37 C), temperature source Oral, resp. rate 17, height 4\' 11"  (1.499 m), weight 42.2 kg, SpO2 98 %.Body mass index is 18.78 kg/m.  General Appearance: Casual  Eye Contact::  Good  Speech:  Clear and Coherent409  Volume:  Normal  Mood:  Euthymic  Affect:  Constricted  Thought Process:  Coherent  Orientation:  Full (Time, Place, and Person)  Thought Content:  Logical  Suicidal Thoughts:  No  Homicidal Thoughts:  No  Memory:  Immediate;   Fair Recent;   Fair Remote;   Fair  Judgement:  Fair  Insight:  Fair  Psychomotor Activity:  Normal  Concentration:  Fair  Recall:  [employer] [place]  Language: Fair  Akathisia:  No  Handed:  Right  AIMS (if indicated):     Assets:  Desire for Improvement Housing Physical Health Resilience Social Support  Sleep:  Number of Hours: 6.75  Cognition: WNL  ADL's:  Intact   Mental Status Per Nursing Assessment::   On Admission:  NA  Demographic Factors:  Caucasian and Living alone  Loss Factors: Financial problems/change in socioeconomic status  Historical Factors: Impulsivity  Risk Reduction Factors:   Sense of responsibility to family,  Positive social support and Positive therapeutic relationship  Continued Clinical Symptoms:  Bipolar Disorder:   Mixed State  Cognitive Features That Contribute To Risk:  None    Suicide Risk:  Minimal: No identifiable suicidal ideation.  Patients presenting with no risk factors but with morbid ruminations; may be classified as minimal risk based on the severity of the depressive symptoms  Follow-up Information    [facility] [place] [facility] up on 01/16/2020.   Why: You have an appointment scheduled for 01/16/20 at 1:20pm. Thank You! Contact information: [place] [address] [phone]           Plan Of Care/Follow-up recommendations:  Activity:  Activity as tolerated Diet:  Regular diet Other:  Follow-up with [place] [facility] and also with the [facility] act team if they will work with you.  Alethia Berthold, MD 01/10/2020, 9:07 AM

## 2020-01-10 NOTE — Progress Notes (Addendum)
Patient is discharged to current residency per MD order. Denies SI/HI. Denies hallucinations.  No sign of distress. Patient's belongings returned. Discharge information provided to patient. Patient verbalizes understanding and motivated for outpatient services.

## 2020-01-10 NOTE — Plan of Care (Signed)
  Problem: Coping: Goal: Ability to verbalize frustrations and anger appropriately will improve Outcome: Progressing  Patient able to verbalize frustrations and anger to writer.

## 2020-01-10 NOTE — Progress Notes (Signed)
  [facility] Adult Case Management Discharge Plan :  Will you be returning to the same living situation after discharge:  Yes,  home At discharge, do you have transportation home?: Yes,  daughter will pick pt up Do you have the ability to pay for your medications: Yes,  medicaid and medicare  Release of information consent forms completed and in the chart;    Patient to Follow up at: Follow-up Information    [facility] [place] [facility] up on 01/16/2020.   Why: You have an appointment scheduled for 01/16/20 at 1:20pm. Thank You! Contact information: [place] [address] [phone]        Services, Psychotherapeutic Follow up.   Why: [facility] will contact you to schedule a phone assessment for ACT services. Thank You! Contact information: [address] [place] [address] [phone]           Next level of care provider has access to [place] and Suicide Prevention discussed: Yes,  SPE completed with pts daughter  Have you used any form of tobacco in the last 30 days? (Cigarettes, Smokeless Tobacco, Cigars, and/or Pipes): Yes  Has patient been referred to the Quitline?: Patient refused referral  Patient has been referred for addiction treatment: [place], LCSW 01/10/2020, 9:13 AM

## 2020-01-10 NOTE — Progress Notes (Signed)
Patient alert and oriented x4, her affect is flat but she brightens, when approached, she denies SI/HI/AVH,herthoughts are organized and coherent, she is pleasant,interacting with peersand staff appropriately,she appearsless anxious,she is complaint with scheduled evening medication and she attended evening wrap up group. 15 minutes safety checks maintained will continue to closely monitor

## 2020-01-10 NOTE — Progress Notes (Signed)
Recreation Therapy Notes   Date: 01/10/2020  Time: 9:30 am  Location: Craft room  Behavioral response: Appropriate  Intervention Topic: Strengths  Discussion/Intervention:  Group content today was focused on strengths. The group identified some of the strengths they have. Individuals stated reason why they do not use their strengths. Patients expressed what strengths others see in them. The group identified important reason to use their strengths. The group participated in the intervention "Picking strengths", where they had a chance to identify some of their strengths. Clinical Observations/Feedback:  Patient came to group and identified her strengths as wisdom, self-control and common sense.   Individual left group early due to unknown reasons.  Leslie Wong LRT/CTRS         Leslie Wong 01/10/2020 11:00 AM

## 2020-01-12 ENCOUNTER — Telehealth: Payer: Self-pay | Admitting: Family Medicine

## 2020-01-12 NOTE — Telephone Encounter (Signed)
Patient was discharged after psychiatric admission.  Please check with her daughter to make sure the patient has psychiatric follow-up.  I was awaiting input from social work about possible options on placement.  Please let me know if her daughter has heard anything in the meantime.  Thanks.

## 2020-01-13 ENCOUNTER — Other Ambulatory Visit: Payer: Medicare Other

## 2020-01-13 NOTE — Telephone Encounter (Signed)
Attempted to reach daughter, phone went straight to vm. Left vm to call back

## 2020-01-15 ENCOUNTER — Ambulatory Visit: Admit: 2020-01-15 | Payer: Medicare Other | Admitting: General Surgery

## 2020-01-15 SURGERY — ESOPHAGOGASTRODUODENOSCOPY (EGD) WITH PROPOFOL
Anesthesia: General

## 2020-01-16 NOTE — Telephone Encounter (Signed)
Dr. Damita Dunnings  I spoke with daughter, she states appt is next week. She wanted to mention that she is concerned because she has noticed since her mother has been off a lot of those medications that were prescribed for her mental health, her mother has been doing better. She has not had any psychotic episodes or bi-polar episodes. She is concerned of the effect it will have on her mother if they try to put her back on those medications.

## 2020-01-16 NOTE — Telephone Encounter (Signed)
I get her point.  I am glad she is doing better in the meantime.  I think it makes sense to get through the follow-up appointment and then go from there.  Has she been contacted from anyone with social work about placement options?  Please let me know.  Thanks.

## 2020-01-17 NOTE — Telephone Encounter (Signed)
Tried to call daughter back and unable to leave a message because her mailbox was full.

## 2020-01-22 ENCOUNTER — Encounter: Payer: Self-pay | Admitting: Gastroenterology

## 2020-01-23 ENCOUNTER — Emergency Department
Admission: EM | Admit: 2020-01-23 | Discharge: 2020-01-23 | Disposition: A | Discharge status: Home / Self Care | Payer: Medicare Other | Attending: Student | Admitting: Student

## 2020-01-23 ENCOUNTER — Other Ambulatory Visit: Payer: Self-pay

## 2020-01-23 DIAGNOSIS — E039 Hypothyroidism, unspecified: Secondary | ICD-10-CM | POA: Diagnosis not present

## 2020-01-23 DIAGNOSIS — R42 Dizziness and giddiness: Secondary | ICD-10-CM | POA: Diagnosis not present

## 2020-01-23 DIAGNOSIS — Z79890 Hormone replacement therapy (postmenopausal): Secondary | ICD-10-CM | POA: Diagnosis not present

## 2020-01-23 DIAGNOSIS — E860 Dehydration: Secondary | ICD-10-CM | POA: Diagnosis not present

## 2020-01-23 DIAGNOSIS — Z882 Allergy status to sulfonamides status: Secondary | ICD-10-CM | POA: Diagnosis not present

## 2020-01-23 DIAGNOSIS — R82998 Other abnormal findings in urine: Secondary | ICD-10-CM | POA: Diagnosis not present

## 2020-01-23 DIAGNOSIS — Z885 Allergy status to narcotic agent status: Secondary | ICD-10-CM | POA: Diagnosis not present

## 2020-01-23 DIAGNOSIS — R208 Other disturbances of skin sensation: Secondary | ICD-10-CM | POA: Diagnosis not present

## 2020-01-23 DIAGNOSIS — E785 Hyperlipidemia, unspecified: Secondary | ICD-10-CM | POA: Diagnosis not present

## 2020-01-23 DIAGNOSIS — R112 Nausea with vomiting, unspecified: Secondary | ICD-10-CM | POA: Diagnosis not present

## 2020-01-23 DIAGNOSIS — G4489 Other headache syndrome: Secondary | ICD-10-CM | POA: Diagnosis not present

## 2020-01-23 DIAGNOSIS — Z886 Allergy status to analgesic agent status: Secondary | ICD-10-CM | POA: Diagnosis not present

## 2020-01-23 DIAGNOSIS — Z79899 Other long term (current) drug therapy: Secondary | ICD-10-CM | POA: Diagnosis not present

## 2020-01-23 DIAGNOSIS — F3189 Other bipolar disorder: Secondary | ICD-10-CM | POA: Diagnosis not present

## 2020-01-23 DIAGNOSIS — Z5321 Procedure and treatment not carried out due to patient leaving prior to being seen by health care provider: Secondary | ICD-10-CM | POA: Insufficient documentation

## 2020-01-23 DIAGNOSIS — F29 Unspecified psychosis not due to a substance or known physiological condition: Secondary | ICD-10-CM | POA: Diagnosis not present

## 2020-01-23 DIAGNOSIS — F17210 Nicotine dependence, cigarettes, uncomplicated: Secondary | ICD-10-CM | POA: Diagnosis not present

## 2020-01-23 DIAGNOSIS — L309 Dermatitis, unspecified: Secondary | ICD-10-CM | POA: Diagnosis not present

## 2020-01-23 DIAGNOSIS — G20 Parkinson's disease: Secondary | ICD-10-CM | POA: Diagnosis not present

## 2020-01-23 DIAGNOSIS — L853 Xerosis cutis: Secondary | ICD-10-CM | POA: Diagnosis not present

## 2020-01-23 DIAGNOSIS — L539 Erythematous condition, unspecified: Secondary | ICD-10-CM | POA: Diagnosis not present

## 2020-01-23 DIAGNOSIS — L299 Pruritus, unspecified: Secondary | ICD-10-CM | POA: Diagnosis not present

## 2020-01-23 LAB — COMPREHENSIVE METABOLIC PANEL
ALT: 16 U/L (ref 0–44)
AST: 19 U/L (ref 15–41)
Albumin: 4.6 g/dL (ref 3.5–5.0)
Alkaline Phosphatase: 82 U/L (ref 38–126)
Anion gap: 9 (ref 5–15)
BUN: 5 mg/dL — ABNORMAL LOW (ref 8–23)
CO2: 27 mmol/L (ref 22–32)
Calcium: 10.8 mg/dL — ABNORMAL HIGH (ref 8.9–10.3)
Chloride: 104 mmol/L (ref 98–111)
Creatinine, Ser: 0.64 mg/dL (ref 0.44–1.00)
GFR calc Af Amer: >60 mL/min (ref >60)
GFR calc non Af Amer: >60 mL/min (ref >60)
Glucose, Bld: 99 mg/dL (ref 70–99)
Potassium: 3.8 mmol/L (ref 3.5–5.1)
Sodium: 140 mmol/L (ref 135–145)
Total Bilirubin: 0.8 mg/dL (ref 0.3–1.2)
Total Protein: 7.6 g/dL (ref 6.5–8.1)

## 2020-01-23 LAB — URINE DRUG SCREEN, QUALITATIVE (ARMC ONLY)
Amphetamines, Ur Screen: NOT DETECTED
Barbiturates, Ur Screen: NOT DETECTED
Benzodiazepine, Ur Scrn: NOT DETECTED
Cannabinoid 50 Ng, Ur ~~LOC~~: NOT DETECTED
Cocaine Metabolite,Ur ~~LOC~~: NOT DETECTED
MDMA (Ecstasy)Ur Screen: NOT DETECTED
Methadone Scn, Ur: NOT DETECTED
Opiate, Ur Screen: NOT DETECTED
Phencyclidine (PCP) Ur S: NOT DETECTED
Tricyclic, Ur Screen: NOT DETECTED

## 2020-01-23 LAB — CBC
HCT: 39.7 % (ref 36.0–46.0)
Hemoglobin: 13.3 g/dL (ref 12.0–15.0)
MCH: 32.2 pg (ref 26.0–34.0)
MCHC: 33.5 g/dL (ref 30.0–36.0)
MCV: 96.1 fL (ref 80.0–100.0)
Platelets: 313 10*3/uL (ref 150–400)
RBC: 4.13 MIL/uL (ref 3.87–5.11)
RDW: 13.1 % (ref 11.5–15.5)
WBC: 10.6 10*3/uL — ABNORMAL HIGH (ref 4.0–10.5)
nRBC: 0 % (ref 0.0–0.2)

## 2020-01-23 LAB — ETHANOL: Alcohol, Ethyl (B): <10 mg/dL (ref <10)

## 2020-01-23 NOTE — ED Notes (Addendum)
Pt walked out of emergency room before this writer was able to speak with patient.  Charge nurse and EDP made aware.

## 2020-01-23 NOTE — ED Notes (Signed)
Pt requesting to leave - not ivc. edp busy and unable to be notified. RN assigned to pt busy with another pt. Pt a/o - no s/s of any issues that would warrant keeping pt. Pt allowed to leave as per her wishes. EDP notified after pt left

## 2020-01-23 NOTE — ED Provider Notes (Signed)
Patient left prior to being evaluated by MD.   Per triage note, she presented for concern for dehydration, labs initiated in triage reassuring, normal creatinine.    Lilia Pro., MD 01/23/20 [phone]

## 2020-01-23 NOTE — ED Notes (Signed)
Urine specimen is clear. Asked pt if she was certain it was urine and not tap water. Pt states it is urine.

## 2020-01-23 NOTE — ED Triage Notes (Signed)
First Nurse Note:  Patient presents to the ED via EMS stating she is dehydrated.  Patient denies vomiting, diarrhea, weakness and dizziness.  Patient states she has felt dehydrated "all winter" and has been seen at the ED multiple times and told she was, "fine".  Patient states, "It feels like somebody has sucked all the life out of me."

## 2020-01-23 NOTE — ED Triage Notes (Signed)
Pt states her skin is burning all over. Skin is dry. Pt is anxious and keeps repeating she is dehydrated. Pt says she is drinking water. Pt denies eating today. Pt states that when she eats "it sucks the water out of her body". Pt states she is being tx for an ulcer. Pt was d/c from [facility] here on the 15th. Pt has not been taking her meds because she is unable to eat.

## 2020-01-27 DIAGNOSIS — M5136 Other intervertebral disc degeneration, lumbar region: Secondary | ICD-10-CM | POA: Diagnosis not present

## 2020-01-27 DIAGNOSIS — M25561 Pain in right knee: Secondary | ICD-10-CM | POA: Diagnosis not present

## 2020-01-27 DIAGNOSIS — G894 Chronic pain syndrome: Secondary | ICD-10-CM | POA: Diagnosis not present

## 2020-01-27 DIAGNOSIS — M545 Low back pain: Secondary | ICD-10-CM | POA: Diagnosis not present

## 2020-01-27 DIAGNOSIS — M1288 Other specific arthropathies, not elsewhere classified, other specified site: Secondary | ICD-10-CM | POA: Diagnosis not present

## 2020-02-04 DIAGNOSIS — F3175 Bipolar disorder, in partial remission, most recent episode depressed: Secondary | ICD-10-CM | POA: Diagnosis not present

## 2020-02-04 DIAGNOSIS — F411 Generalized anxiety disorder: Secondary | ICD-10-CM | POA: Diagnosis not present

## 2020-02-04 DIAGNOSIS — F4310 Post-traumatic stress disorder, unspecified: Secondary | ICD-10-CM | POA: Diagnosis not present

## 2020-02-04 DIAGNOSIS — F17200 Nicotine dependence, unspecified, uncomplicated: Secondary | ICD-10-CM | POA: Diagnosis not present

## 2020-02-04 DIAGNOSIS — G20 Parkinson's disease: Secondary | ICD-10-CM | POA: Diagnosis not present

## 2020-02-05 DIAGNOSIS — Z882 Allergy status to sulfonamides status: Secondary | ICD-10-CM | POA: Diagnosis not present

## 2020-02-05 DIAGNOSIS — R0602 Shortness of breath: Secondary | ICD-10-CM | POA: Diagnosis not present

## 2020-02-05 DIAGNOSIS — R110 Nausea: Secondary | ICD-10-CM | POA: Diagnosis not present

## 2020-02-05 DIAGNOSIS — G20 Parkinson's disease: Secondary | ICD-10-CM | POA: Diagnosis not present

## 2020-02-05 DIAGNOSIS — Z881 Allergy status to other antibiotic agents status: Secondary | ICD-10-CM | POA: Diagnosis not present

## 2020-02-05 DIAGNOSIS — Z79899 Other long term (current) drug therapy: Secondary | ICD-10-CM | POA: Diagnosis not present

## 2020-02-05 DIAGNOSIS — R509 Fever, unspecified: Secondary | ICD-10-CM | POA: Diagnosis not present

## 2020-02-05 DIAGNOSIS — E039 Hypothyroidism, unspecified: Secondary | ICD-10-CM | POA: Diagnosis not present

## 2020-02-05 DIAGNOSIS — E860 Dehydration: Secondary | ICD-10-CM | POA: Diagnosis not present

## 2020-02-05 DIAGNOSIS — K589 Irritable bowel syndrome without diarrhea: Secondary | ICD-10-CM | POA: Diagnosis not present

## 2020-02-05 DIAGNOSIS — R0600 Dyspnea, unspecified: Secondary | ICD-10-CM | POA: Diagnosis not present

## 2020-02-05 DIAGNOSIS — Z885 Allergy status to narcotic agent status: Secondary | ICD-10-CM | POA: Diagnosis not present

## 2020-02-05 DIAGNOSIS — Z889 Allergy status to unspecified drugs, medicaments and biological substances status: Secondary | ICD-10-CM | POA: Diagnosis not present

## 2020-02-05 DIAGNOSIS — J029 Acute pharyngitis, unspecified: Secondary | ICD-10-CM | POA: Diagnosis not present

## 2020-02-05 DIAGNOSIS — F419 Anxiety disorder, unspecified: Secondary | ICD-10-CM | POA: Diagnosis not present

## 2020-02-05 DIAGNOSIS — K219 Gastro-esophageal reflux disease without esophagitis: Secondary | ICD-10-CM | POA: Diagnosis not present

## 2020-02-05 DIAGNOSIS — E785 Hyperlipidemia, unspecified: Secondary | ICD-10-CM | POA: Diagnosis not present

## 2020-02-11 ENCOUNTER — Telehealth: Payer: Self-pay

## 2020-02-11 NOTE — Telephone Encounter (Signed)
I spoke with pt;pt had vomiting on and off on 02/06/20. Pt said since 02/09/20 has episodes of being soaking wet from sweat; when this occurs pt cannot move she is so cold; not sure when last episode of severe sweating was. T 98.6-99.7. today pt has H/A with pain level of 9. Pt is not sure if she is having weakness or SOB. Lower abd is sore. Pt is very tired but pt is not sleeping. Pt is trying to drink but drinking very little. pts mouth is dry, this AM voided small amt and urine was dark. Pt thinks she is dehydrated and pts daughter will take pt to [facility] now. FYI to Dr Damita Dunnings.

## 2020-02-11 NOTE — Telephone Encounter (Signed)
Noted. Thanks. Will await the ER notes.

## 2020-02-13 ENCOUNTER — Telehealth: Payer: Self-pay | Admitting: *Deleted

## 2020-02-13 NOTE — Telephone Encounter (Signed)
(  02/13/2020) Left message for patient to notify them that it is time to schedule annual low dose lung cancer screening CT scan. Instructed patient to call back to verify information prior to the scan being scheduled SRW

## 2020-03-16 DIAGNOSIS — F3189 Other bipolar disorder: Secondary | ICD-10-CM | POA: Diagnosis not present

## 2020-03-16 DIAGNOSIS — K219 Gastro-esophageal reflux disease without esophagitis: Secondary | ICD-10-CM | POA: Diagnosis not present

## 2020-03-16 DIAGNOSIS — E039 Hypothyroidism, unspecified: Secondary | ICD-10-CM | POA: Diagnosis not present

## 2020-03-16 DIAGNOSIS — R112 Nausea with vomiting, unspecified: Secondary | ICD-10-CM | POA: Diagnosis not present

## 2020-03-16 DIAGNOSIS — R638 Other symptoms and signs concerning food and fluid intake: Secondary | ICD-10-CM | POA: Diagnosis not present

## 2020-03-16 DIAGNOSIS — R531 Weakness: Secondary | ICD-10-CM | POA: Diagnosis not present

## 2020-03-16 DIAGNOSIS — F17210 Nicotine dependence, cigarettes, uncomplicated: Secondary | ICD-10-CM | POA: Diagnosis not present

## 2020-03-16 DIAGNOSIS — Z79899 Other long term (current) drug therapy: Secondary | ICD-10-CM | POA: Diagnosis not present

## 2020-03-16 DIAGNOSIS — G20 Parkinson's disease: Secondary | ICD-10-CM | POA: Diagnosis not present

## 2020-03-16 DIAGNOSIS — R001 Bradycardia, unspecified: Secondary | ICD-10-CM | POA: Diagnosis not present

## 2020-03-16 DIAGNOSIS — E785 Hyperlipidemia, unspecified: Secondary | ICD-10-CM | POA: Diagnosis not present

## 2020-03-16 DIAGNOSIS — R1084 Generalized abdominal pain: Secondary | ICD-10-CM | POA: Diagnosis not present

## 2020-03-24 ENCOUNTER — Inpatient Hospital Stay
Admission: AD | Admit: 2020-03-24 | Discharge: 2020-04-03 | DRG: 885 | Disposition: A | Discharge status: Home / Self Care | Payer: Medicare Other | Source: Intra-hospital | Attending: Psychiatry | Admitting: Psychiatry

## 2020-03-24 ENCOUNTER — Emergency Department
Admission: EM | Admit: 2020-03-24 | Discharge: 2020-03-24 | Disposition: A | Discharge status: Other Acute Inpatient Hospital | Payer: Medicare Other | Attending: Student | Admitting: Student

## 2020-03-24 ENCOUNTER — Other Ambulatory Visit: Payer: Self-pay

## 2020-03-24 DIAGNOSIS — Z8249 Family history of ischemic heart disease and other diseases of the circulatory system: Secondary | ICD-10-CM

## 2020-03-24 DIAGNOSIS — G20 Parkinson's disease: Secondary | ICD-10-CM | POA: Insufficient documentation

## 2020-03-24 DIAGNOSIS — Z809 Family history of malignant neoplasm, unspecified: Secondary | ICD-10-CM

## 2020-03-24 DIAGNOSIS — G43909 Migraine, unspecified, not intractable, without status migrainosus: Secondary | ICD-10-CM | POA: Diagnosis present

## 2020-03-24 DIAGNOSIS — Z882 Allergy status to sulfonamides status: Secondary | ICD-10-CM | POA: Diagnosis not present

## 2020-03-24 DIAGNOSIS — Z888 Allergy status to other drugs, medicaments and biological substances status: Secondary | ICD-10-CM | POA: Diagnosis not present

## 2020-03-24 DIAGNOSIS — E039 Hypothyroidism, unspecified: Secondary | ICD-10-CM | POA: Diagnosis present

## 2020-03-24 DIAGNOSIS — Z885 Allergy status to narcotic agent status: Secondary | ICD-10-CM

## 2020-03-24 DIAGNOSIS — K219 Gastro-esophageal reflux disease without esophagitis: Secondary | ICD-10-CM | POA: Diagnosis present

## 2020-03-24 DIAGNOSIS — Z8261 Family history of arthritis: Secondary | ICD-10-CM

## 2020-03-24 DIAGNOSIS — K589 Irritable bowel syndrome without diarrhea: Secondary | ICD-10-CM | POA: Diagnosis present

## 2020-03-24 DIAGNOSIS — Z886 Allergy status to analgesic agent status: Secondary | ICD-10-CM | POA: Diagnosis not present

## 2020-03-24 DIAGNOSIS — R41843 Psychomotor deficit: Secondary | ICD-10-CM | POA: Diagnosis present

## 2020-03-24 DIAGNOSIS — F419 Anxiety disorder, unspecified: Secondary | ICD-10-CM | POA: Diagnosis present

## 2020-03-24 DIAGNOSIS — R531 Weakness: Secondary | ICD-10-CM | POA: Diagnosis not present

## 2020-03-24 DIAGNOSIS — R45851 Suicidal ideations: Secondary | ICD-10-CM | POA: Diagnosis present

## 2020-03-24 DIAGNOSIS — F22 Delusional disorders: Secondary | ICD-10-CM | POA: Diagnosis present

## 2020-03-24 DIAGNOSIS — Z79899 Other long term (current) drug therapy: Secondary | ICD-10-CM | POA: Diagnosis not present

## 2020-03-24 DIAGNOSIS — K449 Diaphragmatic hernia without obstruction or gangrene: Secondary | ICD-10-CM | POA: Diagnosis present

## 2020-03-24 DIAGNOSIS — Z833 Family history of diabetes mellitus: Secondary | ICD-10-CM

## 2020-03-24 DIAGNOSIS — Z85828 Personal history of other malignant neoplasm of skin: Secondary | ICD-10-CM | POA: Diagnosis not present

## 2020-03-24 DIAGNOSIS — Z79890 Hormone replacement therapy (postmenopausal): Secondary | ICD-10-CM | POA: Diagnosis not present

## 2020-03-24 DIAGNOSIS — Z90710 Acquired absence of both cervix and uterus: Secondary | ICD-10-CM

## 2020-03-24 DIAGNOSIS — Z823 Family history of stroke: Secondary | ICD-10-CM

## 2020-03-24 DIAGNOSIS — Z20822 Contact with and (suspected) exposure to covid-19: Secondary | ICD-10-CM | POA: Insufficient documentation

## 2020-03-24 DIAGNOSIS — L853 Xerosis cutis: Secondary | ICD-10-CM | POA: Insufficient documentation

## 2020-03-24 DIAGNOSIS — F17200 Nicotine dependence, unspecified, uncomplicated: Secondary | ICD-10-CM | POA: Insufficient documentation

## 2020-03-24 DIAGNOSIS — R52 Pain, unspecified: Secondary | ICD-10-CM | POA: Insufficient documentation

## 2020-03-24 DIAGNOSIS — G4700 Insomnia, unspecified: Secondary | ICD-10-CM | POA: Diagnosis present

## 2020-03-24 DIAGNOSIS — Z046 Encounter for general psychiatric examination, requested by authority: Secondary | ICD-10-CM | POA: Insufficient documentation

## 2020-03-24 DIAGNOSIS — F319 Bipolar disorder, unspecified: Secondary | ICD-10-CM | POA: Diagnosis present

## 2020-03-24 DIAGNOSIS — F3163 Bipolar disorder, current episode mixed, severe, without psychotic features: Secondary | ICD-10-CM | POA: Insufficient documentation

## 2020-03-24 DIAGNOSIS — Z881 Allergy status to other antibiotic agents status: Secondary | ICD-10-CM | POA: Diagnosis not present

## 2020-03-24 DIAGNOSIS — F17210 Nicotine dependence, cigarettes, uncomplicated: Secondary | ICD-10-CM | POA: Diagnosis present

## 2020-03-24 LAB — CBC WITH DIFFERENTIAL/PLATELET
Abs Immature Granulocytes: 0.01 10*3/uL (ref 0.00–0.07)
Basophils Absolute: 0 10*3/uL (ref 0.0–0.1)
Basophils Relative: 1 %
Eosinophils Absolute: 0.1 10*3/uL (ref 0.0–0.5)
Eosinophils Relative: 1 %
HCT: 42.8 % (ref 36.0–46.0)
Hemoglobin: 14.7 g/dL (ref 12.0–15.0)
Immature Granulocytes: 0 %
Lymphocytes Relative: 35 %
Lymphs Abs: 2.3 10*3/uL (ref 0.7–4.0)
MCH: 32.1 pg (ref 26.0–34.0)
MCHC: 34.3 g/dL (ref 30.0–36.0)
MCV: 93.4 fL (ref 80.0–100.0)
Monocytes Absolute: 0.4 10*3/uL (ref 0.1–1.0)
Monocytes Relative: 6 %
Neutro Abs: 3.7 10*3/uL (ref 1.7–7.7)
Neutrophils Relative %: 57 %
Platelets: 187 10*3/uL (ref 150–400)
RBC: 4.58 MIL/uL (ref 3.87–5.11)
RDW: 13.1 % (ref 11.5–15.5)
WBC: 6.5 10*3/uL (ref 4.0–10.5)
nRBC: 0 % (ref 0.0–0.2)

## 2020-03-24 LAB — COMPREHENSIVE METABOLIC PANEL
ALT: 11 U/L (ref 0–44)
AST: 16 U/L (ref 15–41)
Albumin: 4.3 g/dL (ref 3.5–5.0)
Alkaline Phosphatase: 67 U/L (ref 38–126)
Anion gap: 9 (ref 5–15)
BUN: 11 mg/dL (ref 8–23)
CO2: 27 mmol/L (ref 22–32)
Calcium: 10.4 mg/dL — ABNORMAL HIGH (ref 8.9–10.3)
Chloride: 105 mmol/L (ref 98–111)
Creatinine, Ser: 0.62 mg/dL (ref 0.44–1.00)
GFR calc Af Amer: >60 mL/min (ref >60)
GFR calc non Af Amer: >60 mL/min (ref >60)
Glucose, Bld: 96 mg/dL (ref 70–99)
Potassium: 3.7 mmol/L (ref 3.5–5.1)
Sodium: 141 mmol/L (ref 135–145)
Total Bilirubin: 0.9 mg/dL (ref 0.3–1.2)
Total Protein: 6.7 g/dL (ref 6.5–8.1)

## 2020-03-24 LAB — URINALYSIS, COMPLETE (UACMP) WITH MICROSCOPIC
Bacteria, UA: NONE SEEN
Bilirubin Urine: NEGATIVE
Glucose, UA: NEGATIVE mg/dL
Ketones, ur: 20 mg/dL — AB
Leukocytes,Ua: NEGATIVE
Nitrite: NEGATIVE
Protein, ur: NEGATIVE mg/dL
Specific Gravity, Urine: 1.013 (ref 1.005–1.030)
pH: 6 (ref 5.0–8.0)

## 2020-03-24 LAB — RESPIRATORY PANEL BY RT PCR (FLU A&B, COVID)
Influenza A by PCR: NEGATIVE
Influenza B by PCR: NEGATIVE
SARS Coronavirus 2 by RT PCR: NEGATIVE

## 2020-03-24 LAB — LIPASE, BLOOD: Lipase: 21 U/L (ref 11–51)

## 2020-03-24 LAB — URINE DRUG SCREEN, QUALITATIVE (ARMC ONLY)
Amphetamines, Ur Screen: NOT DETECTED
Barbiturates, Ur Screen: NOT DETECTED
Benzodiazepine, Ur Scrn: NOT DETECTED
Cannabinoid 50 Ng, Ur ~~LOC~~: NOT DETECTED
Cocaine Metabolite,Ur ~~LOC~~: NOT DETECTED
MDMA (Ecstasy)Ur Screen: NOT DETECTED
Methadone Scn, Ur: NOT DETECTED
Opiate, Ur Screen: NOT DETECTED
Phencyclidine (PCP) Ur S: NOT DETECTED
Tricyclic, Ur Screen: NOT DETECTED

## 2020-03-24 LAB — T4, FREE: Free T4: 0.65 ng/dL (ref 0.61–1.12)

## 2020-03-24 LAB — TSH: TSH: 2.119 u[IU]/mL (ref 0.350–4.500)

## 2020-03-24 LAB — ETHANOL: Alcohol, Ethyl (B): <10 mg/dL (ref <10)

## 2020-03-24 MED ORDER — ACETAMINOPHEN 325 MG PO TABS
650.0000 mg | ORAL_TABLET | Freq: Four times a day (QID) | ORAL | Status: DC | PRN
  Administered 2020-03-28 – 2020-04-01 (×2): 650 mg via ORAL
  Filled 2020-03-24 (×2): qty 2

## 2020-03-24 MED ORDER — ENSURE ENLIVE PO LIQD
237.0000 mL | Freq: Once | ORAL | Status: AC
  Administered 2020-03-24: 237 mL via ORAL

## 2020-03-24 MED ORDER — TRAZODONE HCL 50 MG PO TABS
50.0000 mg | ORAL_TABLET | Freq: Every evening | ORAL | Status: DC | PRN
  Administered 2020-03-25 – 2020-03-31 (×5): 50 mg via ORAL
  Filled 2020-03-24 (×5): qty 1

## 2020-03-24 MED ORDER — SODIUM CHLORIDE 0.9 % IV SOLN: Freq: Once | INTRAVENOUS | Status: DC

## 2020-03-24 MED ORDER — ALUM & MAG HYDROXIDE-SIMETH 200-200-20 MG/5ML PO SUSP
30.0000 mL | Freq: Once | ORAL | Status: AC
  Administered 2020-03-24: 30 mL via ORAL
  Filled 2020-03-24: qty 30

## 2020-03-24 MED ORDER — SODIUM CHLORIDE 0.9 % IV BOLUS
1000.0000 mL | Freq: Once | INTRAVENOUS | Status: AC
  Administered 2020-03-24: 1000 mL via INTRAVENOUS

## 2020-03-24 MED ORDER — LEVOTHYROXINE SODIUM 88 MCG PO TABS: 88.0000 ug | ORAL_TABLET | Freq: Every day | ORAL | Status: DC

## 2020-03-24 MED ORDER — ALUM & MAG HYDROXIDE-SIMETH 200-200-20 MG/5ML PO SUSP
30.0000 mL | ORAL | Status: DC | PRN
  Administered 2020-03-27 – 2020-04-02 (×6): 30 mL via ORAL
  Filled 2020-03-24 (×6): qty 30

## 2020-03-24 MED ORDER — MAGNESIUM HYDROXIDE 400 MG/5ML PO SUSP
30.0000 mL | Freq: Every day | ORAL | Status: DC | PRN
  Administered 2020-04-02 – 2020-04-03 (×2): 30 mL via ORAL
  Filled 2020-03-24 (×2): qty 30

## 2020-03-24 MED ORDER — FAMOTIDINE 20 MG PO TABS
20.0000 mg | ORAL_TABLET | Freq: Two times a day (BID) | ORAL | Status: DC
  Administered 2020-03-24: 20 mg via ORAL
  Filled 2020-03-24: qty 1

## 2020-03-24 NOTE — ED Notes (Signed)
Pt to nurses station door stating, "I messed my pants." Pt given clean scrubs and wipes to clean herself.  Bed linen also changed.

## 2020-03-24 NOTE — ED Notes (Signed)
Pt unable to eat anything from meal tray. Pt given cup of sprite per request.

## 2020-03-24 NOTE — ED Notes (Signed)
Report called to [facility] in [facility].

## 2020-03-24 NOTE — ED Notes (Signed)
Pt given meal tray.

## 2020-03-24 NOTE — ED Notes (Signed)
Pt dressed out into burgundy scrubs with this tech and Melanie,EDT in the rm. Pt belongings consist of a pair of pink socks, a pink shirt, a green bath robe, blue tennis shoes, black leggings, pink/green pants, black/pink panties, a pink hair bow and a black barrette. Pt calm and cooperative while dressing out. Pt ambulated to [facility] by this tech.

## 2020-03-24 NOTE — ED Notes (Signed)
Pt to nursing station with requests for blankets and sleeping medication.

## 2020-03-24 NOTE — ED Notes (Signed)

## 2020-03-24 NOTE — ED Triage Notes (Signed)
BIBA from home c/o generalized weakness. Patient reports, "I haven't been able to take proper showers. My body is dehydrating itself." Reports decreased PO, unable to clarify why. Patient listless and slow to respond, but in NAD.

## 2020-03-24 NOTE — Consult Note (Signed)
[place] Psychiatry Consult   Reason for Consult: Suicidal ideation and bizarreness Referring Physician: Dr. Joan Mayans Patient Identification: Leslie Wong MRN:  [id number] Principal Diagnosis: <principal problem not specified> Diagnosis:  Active Problems:   * No active hospital problems. *   Total Time spent with patient: 30 minutes  Subjective:   Leslie Wong is a 64 y.o. female patient who presented with bizarre complaints of being dehydrated from "the inside out".  HPI: Patient is a 64 year old female with a past psychiatric history of bipolar disorder who presents to the emergency department with bizarre complaints as well as suicidal ideation.   Per ED provider note:   "Leslie Wong is a 64 y.o. female with a history of bipolar disorder, hypothyroidism, chronic pain, Parkinson's who presents to the emergency department with multiple vague complaints.  She is concerned for dehydration "from the outside." She is worried about dry skin and says he skin feels like chalk. She is particular that this dehydration is "outside" rather than "inside." She states that she is not able to take significant PO because her "body is shutting down".  She states "I have not been able to take a proper shower" for days/weeks but is unable to explain why.  She states her body is being "abused and neglected" because her of her inability to care for herself or shower. She specifically denies any attempted self harm. She states "my body is shutting down and it may be too late." She reports depression. When asked if she has suicidal ideation she states "no, but I might as will say yes." She hasn't been able to take her prescribed medications in several days.  She says her psychiatrist took her off her Lamictal a month ago because she was "not tolerating it."   Upon evaluation with psychiatrist, patient is cooperative.  She does report severe anxiety and has a difficult time  sitting still during the interview.  Patient provides no logical explanation to her ailments.  She continues to provide tangential responses when asked why she is unable to shower, eat, or take her medications.  Patient is believed to be delusional at this time because of her responses.  Furthermore she continues to show poor understanding and insight into her problems.  Patient reports "burning sensation of her throat" but does not acknowledge that not taking her acid reflux medication may have contributed to this.  She goes on to state that she is dehydrated from the inside out and that she is unable to shower because of her nausea.  Patient acknowledges history of mental health issues including bipolar disorder.  She states that she has been off of her medications for quite some time due to her inability to eat but does not acknowledge that this may be contributing to her presentation.  Patient is difficult to reason with and at times will be extremely frustrated.  When questioned if patient was neglecting herself due to suicidal reasons patient answers "I am not sure, it seems that way".  Patient acknowledges that she was knowingly neglecting herself as a means of harming herself.  Patient is agreeable to inpatient psychiatric hospitalization for stabilization as well as reinitiation of her medications.     Past Psychiatric History: Patient has multiple inpatient hospitalizations in the past most recently in January.  Risk to Self:  Yes Risk to Others:  Now Prior Inpatient Therapy:  Yes Prior Outpatient Therapy:  Yes  Past Medical History:  Past Medical History:  Diagnosis Date  .  Back pain   . Cancer ([place])    skin  . Depression    BAD  . Gastroparesis   . GERD (gastroesophageal reflux disease)   . Hypothyroidism   . Insomnia   . Migraines   . Parkinson disease ([place])    per [facility], dx'd 2019  . Recurrent cold sores   . Shoulder bursitis   . Urge incontinence     Past  Surgical History:  Procedure Laterality Date  . ABDOMINAL HYSTERECTOMY  1999   total  . APPENDECTOMY    . ESOPHAGOGASTRODUODENOSCOPY  01/2006   negative except small hiatal hernia  . ESOPHAGOGASTRODUODENOSCOPY  02/24/2015   See report  . LAPAROSCOPY     for endometriosis  . OVARIAN CYST REMOVAL  1990   unilateral  . TONSILLECTOMY AND ADENOIDECTOMY     Family History:  Family History  Problem Relation Age of Onset  . Hypertension Mother   . Arthritis Mother   . Diabetes Mother   . Stroke Mother   . Cancer Father        lung  . Colon cancer Neg Hx   . Breast cancer Neg Hx    Family Psychiatric  History: Denies Social History:  Social History   Substance and Sexual Activity  Alcohol Use No  . Alcohol/week: 0.0 standard drinks     Social History   Substance and Sexual Activity  Drug Use No    Social History   Socioeconomic History  . Marital status: Divorced    Spouse name: Not on file  . Number of children: Not on file  . Years of education: Not on file  . Highest education level: Not on file  Occupational History  . Not on file  Tobacco Use  . Smoking status: Current Some Day Smoker    Packs/day: 1.00    Years: 42.50    Pack years: 42.50    Types: Cigarettes  . Smokeless tobacco: Former Systems developer  . Tobacco comment: declined  Substance and Sexual Activity  . Alcohol use: No    Alcohol/week: 0.0 standard drinks  . Drug use: No  . Sexual activity: Never  Other Topics Concern  . Not on file  Social History Narrative   Lives alone.     Has a dog names Dixie   Social Determinants of Health   Financial Resource Strain:   . Difficulty of Paying Living Expenses:   Food Insecurity:   . Worried About Charity fundraiser in the Last Year:   . Arboriculturist in the Last Year:   Transportation Needs:   . Film/video editor (Medical):   Marland Kitchen Lack of Transportation (Non-Medical):   Physical Activity:   . Days of Exercise per Week:   . Minutes of Exercise  per Session:   Stress:   . Feeling of Stress :   Social Connections:   . Frequency of Communication with Friends and Family:   . Frequency of Social Gatherings with Friends and Family:   . Attends Religious Services:   . Active Member of Clubs or Organizations:   . Attends Archivist Meetings:   Marland Kitchen Marital Status:    Additional Social History:    Allergies:   Allergies  Allergen Reactions  . Doxepin Other (See Comments)    Pt reports "that made me have seizures"  . Valproic Acid Shortness Of Breath  . Aspirin     REACTION: UNSPECIFIED  . Atorvastatin Other (See Comments)  Tongue swelling, legs cramping  . Baclofen     REACTION: Throat swells up  . Codeine     REACTION: UNSPECIFIED  . Duloxetine Other (See Comments)    Vision loss, weight loss  . Erythromycin     REACTION: UNSPECIFIED  . Gabapentin     REACTION: throat swells up  . Metoclopramide Hcl     REACTION: states "messed me up"  . Nortriptyline     Other reaction(s): Other (See Comments) Vision issues  . Nsaids Other (See Comments)    Stomach problems  . Nucynta Er [Tapentadol Hcl Er]     Intolerant, see note from 07/24/12.    Gean Birchwood [Tapentadol]     AMS  . Olanzapine Nausea Only  . Prednisone     GI intolance  . Pregabalin   . Seroquel [Quetiapine Fumerate] Other (See Comments)    Loss control, felt like on another planet  . Sulfa Antibiotics Nausea And Vomiting  . Topamax Other (See Comments)    Blisters in mouth and tongue  . Vicodin [Hydrocodone-Acetaminophen]     Nausea, thrush and constipation    Labs:  Results for orders placed or performed during the hospital encounter of 03/24/20 (from the past 48 hour(s))  Comprehensive metabolic panel     Status: Abnormal   Collection Time: 03/24/20 12:41 PM  Result Value Ref Range   Sodium 141 135 - 145 mmol/L   Potassium 3.7 3.5 - 5.1 mmol/L   Chloride 105 98 - 111 mmol/L   CO2 27 22 - 32 mmol/L   Glucose, Bld 96 70 - 99 mg/dL     Comment: Glucose reference range applies only to samples taken after fasting for at least 8 hours.   BUN 11 8 - 23 mg/dL   Creatinine, Ser 0.62 0.44 - 1.00 mg/dL   Calcium 10.4 (H) 8.9 - 10.3 mg/dL   Total Protein 6.7 6.5 - 8.1 g/dL   Albumin 4.3 3.5 - 5.0 g/dL   AST 16 15 - 41 U/L   ALT 11 0 - 44 U/L   Alkaline Phosphatase 67 38 - 126 U/L   Total Bilirubin 0.9 0.3 - 1.2 mg/dL   GFR calc non Af Amer >60 >60 mL/min   GFR calc Af Amer >60 >60 mL/min   Anion gap 9 5 - 15    Comment: Performed at [facility], [address] [place],  [address]  CBC with Differential     Status: None   Collection Time: 03/24/20 12:41 PM  Result Value Ref Range   WBC 6.5 4.0 - 10.5 K/uL   RBC 4.58 3.87 - 5.11 MIL/uL   Hemoglobin 14.7 12.0 - 15.0 g/dL   HCT 42.8 36.0 - 46.0 %   MCV 93.4 80.0 - 100.0 fL   MCH 32.1 26.0 - 34.0 pg   MCHC 34.3 30.0 - 36.0 g/dL   RDW 13.1 11.5 - 15.5 %   Platelets 187 150 - 400 K/uL   nRBC 0.0 0.0 - 0.2 %   Neutrophils Relative % 57 %   Neutro Abs 3.7 1.7 - 7.7 K/uL   Lymphocytes Relative 35 %   Lymphs Abs 2.3 0.7 - 4.0 K/uL   Monocytes Relative 6 %   Monocytes Absolute 0.4 0.1 - 1.0 K/uL   Eosinophils Relative 1 %   Eosinophils Absolute 0.1 0.0 - 0.5 K/uL   Basophils Relative 1 %   Basophils Absolute 0.0 0.0 - 0.1 K/uL   Immature Granulocytes 0 %  Abs Immature Granulocytes 0.01 0.00 - 0.07 K/uL    Comment: Performed at [facility], [address] [place], [place] [address]  Ethanol     Status: None   Collection Time: 03/24/20 12:41 PM  Result Value Ref Range   Alcohol, Ethyl (B) <10 <10 mg/dL    Comment: (NOTE) Lowest detectable limit for serum alcohol is 10 mg/dL. For medical purposes only. Performed at [facility], [address] [place], [place] [address]   TSH     Status: None   Collection Time: 03/24/20 12:41 PM  Result Value Ref Range   TSH 2.119 0.350 - 4.500 uIU/mL    Comment: Performed by a 3rd Generation  assay with a functional sensitivity of <=0.01 uIU/mL. Performed at [facility], [address] [place], [place] [address]   T4, free     Status: None   Collection Time: 03/24/20 12:41 PM  Result Value Ref Range   Free T4 0.65 0.61 - 1.12 ng/dL    Comment: (NOTE) Biotin ingestion may interfere with free T4 tests. If the results are inconsistent with the TSH level, previous test results, or the clinical presentation, then consider biotin interference. If needed, order repeat testing after stopping biotin. Performed at [facility], [address] [place], [place] [address]   Lipase, blood     Status: None   Collection Time: 03/24/20 12:41 PM  Result Value Ref Range   Lipase 21 11 - 51 U/L    Comment: Performed at [facility], [address] [place], [place] [address]    Current Facility-Administered Medications  Medication Dose Route Frequency Provider Last Rate Last Admin  . 0.9 %  sodium chloride infusion   Intravenous Once Lilia Pro., MD       Current Outpatient Medications  Medication Sig Dispense Refill  . albuterol (VENTOLIN HFA) 108 (90 Base) MCG/ACT inhaler Inhale 2 puffs into the lungs every 6 (six) hours as needed for wheezing or shortness of breath. 18 g 11  . ascorbic acid (VITAMIN C) 500 MG tablet Take 1 tablet (500 mg total) by mouth daily. 30 tablet 1  . buPROPion (WELLBUTRIN) 75 MG tablet Take 1 tablet (75 mg total) by mouth daily. 30 tablet 1  . cholecalciferol (VITAMIN D) 25 MCG (1000 UNIT) tablet Take 1 tablet (1,000 Units total) by mouth daily. 30 tablet 1  . famotidine (PEPCID) 20 MG tablet Take 1 tablet (20 mg total) by mouth 2 (two) times daily. 60 tablet 0  . lamoTRIgine (LAMICTAL) 25 MG tablet Take 2 tablets (50 mg total) by mouth daily. 60 tablet 1  . levothyroxine (SYNTHROID) 88 MCG tablet Take 1 tablet (88 mcg total) by mouth daily. 30 tablet 1  . OLANZapine (ZYPREXA) 20 MG tablet Take 1 tablet (20 mg total) by mouth  at bedtime. 30 tablet 1  . propranolol (INDERAL) 10 MG tablet Take 1 tablet (10 mg total) by mouth 2 (two) times daily. 60 tablet 1  . SENOKOT 8.6 MG tablet Take 2 tablets by mouth daily.    . sucralfate (CARAFATE) 1 g tablet Take 1 tablet (1 g total) by mouth 4 (four) times daily. 120 tablet 1  . valACYclovir (VALTREX) 500 MG tablet TAKE 1 TABLET(500 MG) BY MOUTH TWICE DAILY 60 tablet 5  . vitamin B-12 (CYANOCOBALAMIN) 1000 MCG tablet Take 1 tablet (1,000 mcg total) by mouth daily. (Patient not taking: Reported on 08/27/2019) 90 tablet 3    Musculoskeletal: Strength & Muscle  Tone: within normal limits Gait & Station: normal Patient leans: N/A  Psychiatric Specialty Exam: Physical Exam  Review of Systems  Blood pressure 122/85, pulse 67, temperature 98.1 F (36.7 C), temperature source Oral, resp. rate (!) 23, SpO2 96 %.There is no height or weight on file to calculate BMI.  General Appearance: Disheveled  Eye Contact:  Fair  Speech:  Clear and Coherent  Volume:  Normal  Mood:  Depressed and Dysphoric  Affect:  Congruent  Thought Process:  Disorganized  Orientation:  Full (Time, Place, and Person)  Thought Content:  Delusions, Rumination and Tangential  Suicidal Thoughts:  No  Homicidal Thoughts:  No  Memory:  Recent;   Fair  Judgement:  Impaired  Insight:  Lacking  Psychomotor Activity:  Normal  Concentration:  Concentration: Fair  Recall:  [employer] of Knowledge:  Fair  Language:  Fair  Akathisia:  No  Handed:  Right  AIMS (if indicated):     Assets:  Communication Skills Desire for Improvement Physical Health  ADL's:  Impaired  Cognition:  WNL  Sleep:        Treatment Plan Summary: Patient with tangential disorganized thought process in addition to not being able to maintain her activities of daily living.  It is believed that there is a psychiatric origin to these problems as patient has a history of bipolar disorder and has presented similarly in the past.  It is  in best interest for the patient to be admitted to the psychiatric unit for purposes of medication management, stabilization, and safety.  Diagnosis: Bipolar disorder  Disposition: Recommend psychiatric Inpatient admission when medically cleared.  Dixie Dials, MD 03/24/2020 3:30 PM

## 2020-03-24 NOTE — ED Notes (Signed)
Pt. Alert and oriented, warm and dry, in no distress. Pt. Denies SI, HI, and AVH. Pt states she cant eat or drink due to burping. Writer advised patient it is normal to burp and she should try to eat some crackers and drink some water. Writer took patient some water and saline crackers. Pt. Encouraged to let nursing staff know of any concerns or needs.

## 2020-03-24 NOTE — BH Assessment (Signed)
Assessment Note  Leslie Wong is an 64 y.o. female. Ms. Rajski arrived to the ED by way of EMS.  She reports, "I have dehydrated cells. I have trouble taking showers, I get dizzy.  I can't eat.  I'm in a lot of pain."  She reports that this has been occurring for the past 3-4 weeks.  "I am getting dehydrated from the inside to the outside.  It hurts to take a shower". She reports that she feels depressed based on the situation she is in.  "I try to take my medication, but I eat and drink very little.  I can barely eat or drink anything. I don't know what I can do to repair my skin". "I am in a lot of pain and I don't like being in pain. I have had my episodes of crying. My body feels like it is on fire because it is not having the proper treatment".   She denied having auditory or visual hallucination. She denied suicidal ideation or intent.  She denied having homicidal ideation or intent. She denied the use of alcohol or drugs.  She denied additional stressors.  "I just haven't felt well at all".     Diagnosis: Bipolar Disorder  Past Medical History:  Past Medical History:  Diagnosis Date  . Back pain   . Cancer ([place])    skin  . Depression    BAD  . Gastroparesis   . GERD (gastroesophageal reflux disease)   . Hypothyroidism   . Insomnia   . Migraines   . Parkinson disease ([place])    per [facility], dx'd 2019  . Recurrent cold sores   . Shoulder bursitis   . Urge incontinence     Past Surgical History:  Procedure Laterality Date  . ABDOMINAL HYSTERECTOMY  1999   total  . APPENDECTOMY    . ESOPHAGOGASTRODUODENOSCOPY  01/2006   negative except small hiatal hernia  . ESOPHAGOGASTRODUODENOSCOPY  02/24/2015   See report  . LAPAROSCOPY     for endometriosis  . OVARIAN CYST REMOVAL  1990   unilateral  . TONSILLECTOMY AND ADENOIDECTOMY      Family History:  Family History  Problem Relation Age of Onset  . Hypertension Mother   . Arthritis Mother   . Diabetes Mother    . Stroke Mother   . Cancer Father        lung  . Colon cancer Neg Hx   . Breast cancer Neg Hx     Social History:  reports that she has been smoking cigarettes. She has a 42.50 pack-year smoking history. She has quit using smokeless tobacco. She reports that she does not drink alcohol or use drugs.  Additional Social History:  Alcohol / Drug Use History of alcohol / drug use?: No history of alcohol / drug abuse  CIWA: CIWA-Ar BP: 122/85 Pulse Rate: 67 COWS:    Allergies:  Allergies  Allergen Reactions  . Doxepin Other (See Comments)    Pt reports "that made me have seizures"  . Valproic Acid Shortness Of Breath  . Aspirin     REACTION: UNSPECIFIED  . Atorvastatin Other (See Comments)    Tongue swelling, legs cramping  . Baclofen     REACTION: Throat swells up  . Codeine     REACTION: UNSPECIFIED  . Duloxetine Other (See Comments)    Vision loss, weight loss  . Erythromycin     REACTION: UNSPECIFIED  . Gabapentin     REACTION: throat  swells up  . Metoclopramide Hcl     REACTION: states "messed me up"  . Nortriptyline     Other reaction(s): Other (See Comments) Vision issues  . Nsaids Other (See Comments)    Stomach problems  . Nucynta Er [Tapentadol Hcl Er]     Intolerant, see note from 07/24/12.    Gean Birchwood [Tapentadol]     AMS  . Olanzapine Nausea Only  . Prednisone     GI intolance  . Pregabalin   . Seroquel [Quetiapine Fumerate] Other (See Comments)    Loss control, felt like on another planet  . Sulfa Antibiotics Nausea And Vomiting  . Topamax Other (See Comments)    Blisters in mouth and tongue  . Vicodin [Hydrocodone-Acetaminophen]     Nausea, thrush and constipation    Home Medications: (Not in a hospital admission)   OB/GYN Status:  No LMP recorded. Patient has had a hysterectomy.  General Assessment Data Location of Assessment: [facility] ED TTS Assessment: In system Is this a Tele or Face-to-Face Assessment?: Face-to-Face Is this an Initial  Assessment or a Re-assessment for this encounter?: Initial Assessment Patient Accompanied by:: N/A Language Other than English: No Living Arrangements: Other (Comment)(Private residence) What gender do you identify as?: Female Marital status: Divorced [place] name: McAdams Pregnancy Status: No Living Arrangements: Alone Can pt return to current living arrangement?: Yes Admission Status: Voluntary Is patient capable of signing voluntary admission?: Yes Referral Source: Self/Family/Friend Insurance type: medicare, medicaid  Medical Screening Exam ([employer]) Medical Exam completed: Yes  Crisis Care Plan Living Arrangements: Alone Legal Guardian: Other:(Self) Name of Psychiatrist: [facility] Name of Therapist: [facility]  Education Status Is patient currently in school?: No Is the patient employed, unemployed or receiving disability?: Receiving disability income  Risk to self with the past 6 months Suicidal Ideation: No Has patient been a risk to self within the past 6 months prior to admission? : No Suicidal Intent: No Has patient had any suicidal intent within the past 6 months prior to admission? : No Is patient at risk for suicide?: No Suicidal Plan?: No Has patient had any suicidal plan within the past 6 months prior to admission? : No Access to Means: No What has been your use of drugs/alcohol within the last 12 months?: Denied use Previous Attempts/Gestures: No How many times?: 0 Other Self Harm Risks: Denied Triggers for Past Attempts: None known Intentional Self Injurious Behavior: None Family Suicide History: No Recent stressful life event(s): Other (Comment)(health concerns) Persecutory voices/beliefs?: No Depression: Yes Depression Symptoms: Fatigue Substance abuse history and/or treatment for substance abuse?: No Suicide prevention information given to non-admitted patients: Not applicable  Risk to Others within the past 6  months Homicidal Ideation: No Does patient have any lifetime risk of violence toward others beyond the six months prior to admission? : No Thoughts of Harm to Others: No Current Homicidal Intent: No Current Homicidal Plan: No Access to Homicidal Means: No Identified Victim: None identified History of harm to others?: No Assessment of Violence: None Noted Does patient have access to weapons?: No Criminal Charges Pending?: No Does patient have a court date: No Is patient on probation?: No  Psychosis Hallucinations: None noted  Mental Status Report Appearance/Hygiene: In scrubs, Unremarkable Eye Contact: Fair Motor Activity: Unremarkable Speech: Unremarkable Level of Consciousness: Alert Mood: Depressed Affect: Appropriate to circumstance Anxiety Level: None Thought Processes: Unable to Assess Judgement: Partial Orientation: Appropriate for developmental age Obsessive Compulsive Thoughts/Behaviors: Unable to Assess  Cognitive Functioning Concentration: Fair Memory: Recent Intact Is patient IDD: No Insight: Fair Impulse Control: Fair Appetite: Poor Sleep: Decreased Vegetative Symptoms: None  ADLScreening [facility] Assessment Services) Patient's cognitive ability adequate to safely complete daily activities?: Yes Patient able to express need for assistance with ADLs?: Yes Independently performs ADLs?: Yes (appropriate for developmental age)  Prior Inpatient Therapy Prior Inpatient Therapy: Yes Prior Therapy Dates: January 2021 and prior Prior Therapy Facilty/Provider(s): [facility], [facility] Reason for Treatment: Bipolar Disorder, Substance Abuse  Prior Outpatient Therapy Prior Outpatient Therapy: Yes Prior Therapy Dates: Current Prior Therapy Facilty/Provider(s): [facility] Reason for Treatment: Bipolar Disorder Does patient have an ACCT team?: No Does patient have Intensive In-House Services?  : No Does patient have [facility] services? : No Does patient  have P4CC services?: No  ADL Screening (condition at time of admission) Patient's cognitive ability adequate to safely complete daily activities?: Yes Is the patient deaf or have difficulty hearing?: No Does the patient have difficulty seeing, even when wearing glasses/contacts?: No Does the patient have difficulty concentrating, remembering, or making decisions?: No Patient able to express need for assistance with ADLs?: Yes Does the patient have difficulty dressing or bathing?: No Independently performs ADLs?: Yes (appropriate for developmental age) Does the patient have difficulty walking or climbing stairs?: Yes("With my feet the way they are, I can't do stairs well") Weakness of Legs: Both Weakness of Arms/Hands: None  Home Assistive Devices/Equipment Home Assistive Devices/Equipment: None    Abuse/Neglect Assessment (Assessment to be complete while patient is alone) Abuse/Neglect Assessment Can Be Completed: (Denied a history of abuse)     Advance Directives (For Healthcare) Does Patient Have a Medical Advance Directive?: No          Disposition:  Disposition Initial Assessment Completed for this Encounter: Yes  On Site Evaluation by:   Reviewed with Physician:    Elmer Bales 03/24/2020 9:35 PM

## 2020-03-24 NOTE — ED Provider Notes (Signed)
[facility] Emergency Department Provider Note  ____________________________________________   First MD Initiated Contact with Patient 03/24/20 1158     (approximate)  I have reviewed the triage vital signs and the nursing notes.  History  Chief Complaint Weakness    HPI Leslie Wong is a 64 y.o. female with a history of bipolar disorder, hypothyroidism, chronic pain, Parkinson's who presents to the emergency department with multiple vague complaints.  She is concerned for dehydration "from the outside." She is worried about dry skin and says her skin feels like chalk. She complains of whole body pain, states she feels like she is on fire. She is particular that this dehydration is "outside" rather than "inside." She states that she is not able to take significant PO because her "body is shutting down".  She states "I have not been able to take a proper shower" for days/weeks but is unable to explain why.  She states her body is being "abused and neglected" because her of her inability to care for herself or shower. She specifically denies any attempted self harm. She states "my body is shutting down and it may be too late." She reports depression. When asked if she has suicidal ideation she states "no, but I might as will say yes." She hasn't been able to take her prescribed medications in several days because of these issues.  She says her psychiatrist took her off her Lamictal a month ago because she was "not tolerating it."   Past Medical Hx Past Medical History:  Diagnosis Date  . Back pain   . Cancer ([place])    skin  . Depression    BAD  . Gastroparesis   . GERD (gastroesophageal reflux disease)   . Hypothyroidism   . Insomnia   . Migraines   . Parkinson disease ([place])    per [facility], dx'd 2019  . Recurrent cold sores   . Shoulder bursitis   . Urge incontinence     Problem List Patient Active Problem List   Diagnosis Date Noted  .  Bipolar I disorder, most recent episode depressed ([place]) 01/07/2020  . MDD (major depressive disorder) 01/02/2020  . Depression 12/11/2019  . Paranoia ([place])   . Cannabis abuse 05/28/2019  . Bipolar 1 disorder, mixed, severe ([place]) 05/27/2019  . Opiate dependence ([place]) 04/25/2019  . Cracking skin 01/27/2019  . Delusion ([place]) 09/26/2018  . Night sweats 05/13/2018  . Pupil asymmetry 05/13/2018  . DDD (degenerative disc disease), cervical 04/23/2018  . Parkinson disease ([place])   . HLD (hyperlipidemia) 03/10/2016  . Advance care planning 03/10/2016  . Cervical radiculopathy 07/30/2015  . Osteopenia 04/18/2014  . Medicare annual wellness visit, subsequent 03/18/2014  . Shoulder pain 01/17/2013  . Chronic back pain 09/14/2011  . Hypercalcemia 03/25/2010  . INCONTINENCE, URGE 04/16/2009  . CARPAL TUNNEL SYNDROME, BILATERAL 09/24/2008  . 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 Hypothyroidism 06/05/2007  . Bipolar affective disorder, current episode hypomanic ([place]) 06/05/2007  . MIGRAINE HEADACHE 06/05/2007  . Gastroesophageal reflux disease with hiatal hernia 06/05/2007  . IRRITABLE BOWEL SYNDROME 06/05/2007  . FIBROCYSTIC BREAST DISEASE 06/05/2007    Past Surgical Hx Past Surgical History:  Procedure Laterality Date  . ABDOMINAL HYSTERECTOMY  1999   total  . APPENDECTOMY    . ESOPHAGOGASTRODUODENOSCOPY  01/2006   negative except small hiatal hernia  . ESOPHAGOGASTRODUODENOSCOPY  02/24/2015   See report  . LAPAROSCOPY     for endometriosis  . [place]  unilateral  . TONSILLECTOMY AND ADENOIDECTOMY      Medications Prior to Admission medications   Medication Sig Start Date End Date Taking? Authorizing Provider  albuterol (VENTOLIN HFA) 108 (90 Base) MCG/ACT inhaler Inhale 2 puffs into the lungs every 6 (six) hours as needed for wheezing or shortness of breath. 07/25/19   Tonia Ghent, MD  ascorbic acid (VITAMIN C) 500 MG tablet Take 1 tablet (500 mg total) by  mouth daily. 01/10/20   Money, Lowry Ram, FNP  buPROPion (WELLBUTRIN) 75 MG tablet Take 1 tablet (75 mg total) by mouth daily. 01/11/20   Money, Lowry Ram, FNP  cholecalciferol (VITAMIN D) 25 MCG (1000 UNIT) tablet Take 1 tablet (1,000 Units total) by mouth daily. 01/10/20   Money, Lowry Ram, FNP  famotidine (PEPCID) 20 MG tablet Take 1 tablet (20 mg total) by mouth 2 (two) times daily. 12/11/19   Carrie Mew, MD  lamoTRIgine (LAMICTAL) 25 MG tablet Take 2 tablets (50 mg total) by mouth daily. 01/10/20   Money, Lowry Ram, FNP  levothyroxine (SYNTHROID) 88 MCG tablet Take 1 tablet (88 mcg total) by mouth daily. 01/11/20   Money, Lowry Ram, FNP  OLANZapine (ZYPREXA) 20 MG tablet Take 1 tablet (20 mg total) by mouth at bedtime. 01/10/20   Money, Lowry Ram, FNP  propranolol (INDERAL) 10 MG tablet Take 1 tablet (10 mg total) by mouth 2 (two) times daily. 01/10/20   Money, Lowry Ram, FNP  SENOKOT 8.6 MG tablet Take 2 tablets by mouth daily. 07/14/19   [provider]  sucralfate (CARAFATE) 1 g tablet Take 1 tablet (1 g total) by mouth 4 (four) times daily. 01/10/20   Money, Lowry Ram, FNP  valACYclovir (VALTREX) 500 MG tablet TAKE 1 TABLET(500 MG) BY MOUTH TWICE DAILY 07/23/19   Tonia Ghent, MD  vitamin B-12 (CYANOCOBALAMIN) 1000 MCG tablet Take 1 tablet (1,000 mcg total) by mouth daily. Patient not taking: Reported on 08/27/2019 07/25/19   Tonia Ghent, MD    Allergies Doxepin, Valproic acid, Aspirin, Atorvastatin, Baclofen, Codeine, Duloxetine, Erythromycin, Gabapentin, Metoclopramide hcl, Nortriptyline, Nsaids, Nucynta er [tapentadol hcl er], Nucynta [tapentadol], Olanzapine, Prednisone, Pregabalin, Seroquel [quetiapine fumerate], Sulfa antibiotics, Topamax, and Vicodin [hydrocodone-acetaminophen]  Family Hx Family History  Problem Relation Age of Onset  . Hypertension Mother   . Arthritis Mother   . Diabetes Mother   . Stroke Mother   . Cancer Father        lung  . Colon cancer Neg Hx     . Breast cancer Neg Hx     Social Hx Social History   Tobacco Use  . Smoking status: Current Some Day Smoker    Packs/day: 1.00    Years: 42.50    Pack years: 42.50    Types: Cigarettes  . Smokeless tobacco: Former Systems developer  . Tobacco comment: declined  Substance Use Topics  . Alcohol use: No    Alcohol/week: 0.0 standard drinks  . Drug use: No     Review of Systems  Constitutional: Negative for fever. Negative for chills. + generalized weakness Eyes: Negative for visual changes. ENT: Negative for sore throat. Cardiovascular: Negative for chest pain. Respiratory: Negative for shortness of breath. Gastrointestinal: Negative for nausea. Negative for vomiting.  Genitourinary: Negative for dysuria. Musculoskeletal: Negative for leg swelling. Skin: Negative for rash. + dry skin Neurological: Negative for headaches.   Physical Exam  Vital Signs: ED Triage Vitals  Enc Vitals Group     BP 03/24/20 1210 (!) 140/96  Pulse Rate 03/24/20 1210 77     Resp 03/24/20 1210 20     Temp 03/24/20 1210 98.1 F (36.7 C)     Temp Source 03/24/20 1210 Oral     SpO2 03/24/20 1210 96 %     Weight --      Height --      Head Circumference --      Peak Flow --      Pain Score 03/24/20 1246 10     Pain Loc --      Pain Edu? --      Excl. in [place]? --      Constitutional: Alert and oriented.  Appears older than stated age, frail appearing. Head: Normocephalic. Atraumatic. Eyes: Conjunctivae clear. Sclera anicteric. Pupils equal and symmetric. Nose: No masses or lesions. No congestion or rhinorrhea. Mouth/Throat: Wearing mask.  Neck: No stridor. Trachea midline.  Cardiovascular: Normal rate, regular rhythm. Extremities well perfused. Respiratory: Normal respiratory effort.  Lungs CTAB. Gastrointestinal: Soft. Non-distended. Non-tender.  Genitourinary: Deferred. Musculoskeletal: No lower extremity edema. No deformities. Neurologic:  Normal speech and language. No gross focal or  lateralizing neurologic deficits are appreciated.  Skin: Mild dryness of the skin.  Psychiatric: Flat and odd affect. Depressed mood.   EKG  Personally reviewed and interpreted by myself.   Rate: 68 Rhythm: sinus Axis: normal Intervals: borderline short PR 115 ms No STEMI    Radiology   N/A   Procedures  Procedure(s) performed (including critical care):  Procedures   Initial Impression / Assessment and Plan / MDM / ED Course  64 y.o. female who presents to the ED for generalized weakness, multiple complaints as noted above  Ddx: anemia, electrolyte abnormality, dehydration, thyroid disease, UTI, psychiatric etiology  Will plan for labs, UA, EKG, psychiatry consult  Clinical Course as of Mar 24 1650  Tue Mar 24, 2020  1415 Labs are all unremarkable.  No anemia.  Electrolytes without actionable derangements.  Thyroid studies normal. Given negative work up, highest suspicion for psychiatric etiology. Psych consult placed, which patient is agreeable to.   [SM]  1449 Updated patient on results. She is medically clear. She still desires discussion with psychiatry. Consult in. She is voluntary. The patient has been placed in psychiatric observation due to the need to provide a safe environment for the patient while obtaining psychiatric consultation and evaluation, as well as ongoing medical and medication management to treat the patient's condition. The patient has not been placed under full IVC at this time.     [SM]  1646 UA negative for infection and COVID negative. Psychiatry has seen and evaluated and recommend admission.   [SM]    Clinical Course User Index [SM] Lilia Pro., MD     _______________________________   As part of my medical decision making I have reviewed available labs, radiology tests, reviewed old records.   Final Clinical Impression(s) / ED Diagnosis  Final diagnoses:  Generalized weakness       Note:  This document was  prepared using Dragon voice recognition software and may include unintentional dictation errors.   Lilia Pro., MD 03/24/20 [phone]

## 2020-03-24 NOTE — ED Notes (Signed)
Pt given ensure 

## 2020-03-24 NOTE — ED Notes (Signed)
Pt asked for liquid medication for her stomach. "I burp up lava anytime I eat. Even if it's only a bite."

## 2020-03-25 ENCOUNTER — Encounter: Payer: Self-pay | Admitting: Behavioral Health

## 2020-03-25 DIAGNOSIS — F319 Bipolar disorder, unspecified: Principal | ICD-10-CM

## 2020-03-25 MED ORDER — LEVOTHYROXINE SODIUM 88 MCG PO TABS
88.0000 ug | ORAL_TABLET | Freq: Every day | ORAL | Status: DC
  Administered 2020-03-26 – 2020-04-03 (×8): 88 ug via ORAL
  Filled 2020-03-25 (×11): qty 1

## 2020-03-25 MED ORDER — ONDANSETRON 4 MG PO TBDP: 4.0000 mg | ORAL_TABLET | Freq: Three times a day (TID) | ORAL | Status: DC | PRN

## 2020-03-25 MED ORDER — OXYCODONE HCL 5 MG PO TABS
5.0000 mg | ORAL_TABLET | Freq: Three times a day (TID) | ORAL | Status: DC
  Administered 2020-03-25 – 2020-04-03 (×24): 5 mg via ORAL
  Filled 2020-03-25 (×25): qty 1

## 2020-03-25 MED ORDER — HYDROCERIN EX CREA
TOPICAL_CREAM | Freq: Two times a day (BID) | CUTANEOUS | Status: DC
  Administered 2020-03-29: 1 via TOPICAL
  Filled 2020-03-25: qty 113

## 2020-03-25 MED ORDER — PANTOPRAZOLE SODIUM 40 MG PO TBEC
40.0000 mg | DELAYED_RELEASE_TABLET | Freq: Every day | ORAL | Status: DC
  Administered 2020-03-25 – 2020-04-03 (×9): 40 mg via ORAL
  Filled 2020-03-25 (×10): qty 1

## 2020-03-25 MED ORDER — ADULT MULTIVITAMIN W/MINERALS CH
1.0000 | ORAL_TABLET | Freq: Every day | ORAL | Status: DC
  Administered 2020-03-27 – 2020-04-03 (×8): 1 via ORAL
  Filled 2020-03-25 (×9): qty 1

## 2020-03-25 MED ORDER — OLANZAPINE 10 MG PO TABS
10.0000 mg | ORAL_TABLET | Freq: Every day | ORAL | Status: DC
  Administered 2020-03-25: 22:00:00 10 mg via ORAL
  Filled 2020-03-25: qty 1

## 2020-03-25 MED ORDER — ENSURE ENLIVE PO LIQD
237.0000 mL | Freq: Two times a day (BID) | ORAL | Status: DC
  Administered 2020-03-25 – 2020-03-26 (×2): 237 mL via ORAL

## 2020-03-25 MED ORDER — FAMOTIDINE 20 MG PO TABS
20.0000 mg | ORAL_TABLET | Freq: Two times a day (BID) | ORAL | Status: DC
  Administered 2020-03-25 – 2020-04-03 (×16): 20 mg via ORAL
  Filled 2020-03-25 (×17): qty 1

## 2020-03-25 MED ORDER — ENSURE ENLIVE PO LIQD
237.0000 mL | Freq: Two times a day (BID) | ORAL | Status: DC
  Administered 2020-03-25: 16:00:00 237 mL via ORAL

## 2020-03-25 NOTE — H&P (Signed)
Psychiatric Admission Assessment Adult  Patient Identification: Leslie Wong MRN:  [id number] Date of Evaluation:  03/25/2020 Chief Complaint:  Bipolar 1 disorder ([place]) [F31.9] Principal Diagnosis: Bipolar 1 disorder ([place]) Diagnosis:  Principal Problem:   Bipolar 1 disorder ([place]) Active Problems:   Hypothyroidism   Gastroesophageal reflux disease with hiatal hernia   Irritable bowel syndrome  History of Present Illness: Patient seen chart reviewed.  64 year old woman with a history of bipolar disorder.  Came to the emergency room because of worsening health.  She says that for the past month she has been unable to eat unable to drink and unable to sleep.  She cannot really describe why it is that she is unable to do this except to say when she tries to drink she starts to burp uncontrollably.  She feels like her body has "dehydrated itself".  Mood is very anxious and very down and depressed and hopeless although she denies suicidal intent or plan.  Patient is scattered but denies hallucinations and does not seem delirious or particularly disorganized in a way she has been in the past.  She admits that she has been off of her psychiatric medicine for several weeks now.  Denies drug or alcohol abuse although she continues to take the narcotics prescribed by her pain management doctor regularly. Associated Signs/Symptoms: Depression Symptoms:  depressed mood, anhedonia, insomnia, psychomotor retardation, weight loss, (Hypo) Manic Symptoms:  None Anxiety Symptoms:  Excessive Worry, Psychotic Symptoms:  Paranoia, PTSD Symptoms: Negative Total Time spent with patient: 1 hour  Past Psychiatric History: Patient has had multiple hospitalizations with a diagnosis of bipolar disorder.  Complicated often by substance abuse.  Has had periods of delirious psychosis in the past but right now and on her last admission seems to be doing slightly better.  Has struggled with any kind of follow-up  especially as she lives by herself.  Does have a past history of suicidality.  Is the patient at risk to self? Yes.    Has the patient been a risk to self in the past 6 months? Yes.    Has the patient been a risk to self within the distant past? Yes.    Is the patient a risk to others? No.  Has the patient been a risk to others in the past 6 months? No.  Has the patient been a risk to others within the distant past? No.   Prior Inpatient Therapy:   Prior Outpatient Therapy:    Alcohol Screening: 1. How often do you have a drink containing alcohol?: Never 2. How many drinks containing alcohol do you have on a typical day when you are drinking?: 1 or 2 3. How often do you have six or more drinks on one occasion?: Never AUDIT-C Score: 0 4. How often during the last year have you found that you were not able to stop drinking once you had started?: Never 5. How often during the last year have you failed to do what was normally expected from you becasue of drinking?: Never 6. How often during the last year have you needed a first drink in the morning to get yourself going after a heavy drinking session?: Never 7. How often during the last year have you had a feeling of guilt of remorse after drinking?: Never 8. How often during the last year have you been unable to remember what happened the night before because you had been drinking?: Never 9. Have you or someone else been injured as a result  of your drinking?: No 10. Has a relative or friend or a doctor or another health worker been concerned about your drinking or suggested you cut down?: No Alcohol Use Disorder Identification Test Final Score (AUDIT): 0 Alcohol Brief Interventions/Follow-up: AUDIT Score <7 follow-up not indicated Substance Abuse History in the last 12 months:  Yes.   Consequences of Substance Abuse: Medical Consequences:  Overdoing it on her pain medicine often leads to delirium Previous Psychotropic Medications: Yes   Psychological Evaluations: Yes  Past Medical History:  Past Medical History:  Diagnosis Date  . Back pain   . Cancer ([place])    skin  . Depression    BAD  . Gastroparesis   . GERD (gastroesophageal reflux disease)   . Hypothyroidism   . Insomnia   . Migraines   . Parkinson disease ([place])    per [place] [facility], dx'd 2019  . Recurrent cold sores   . Shoulder bursitis   . Urge incontinence     Past Surgical History:  Procedure Laterality Date  . ABDOMINAL HYSTERECTOMY  1999   total  . APPENDECTOMY    . ESOPHAGOGASTRODUODENOSCOPY  01/2006   negative except small hiatal hernia  . ESOPHAGOGASTRODUODENOSCOPY  02/24/2015   See report  . LAPAROSCOPY     for endometriosis  . OVARIAN CYST REMOVAL  1990   unilateral  . TONSILLECTOMY AND ADENOIDECTOMY     Family History:  Family History  Problem Relation Age of Onset  . Hypertension Mother   . Arthritis Mother   . Diabetes Mother   . Stroke Mother   . Cancer Father        lung  . Colon cancer Neg Hx   . Breast cancer Neg Hx    Family Psychiatric  History: See previous notes.  No significant family history identified Tobacco Screening: Have you used any form of tobacco in the last 30 days? (Cigarettes, Smokeless Tobacco, Cigars, and/or Pipes): No Social History:  Social History   Substance and Sexual Activity  Alcohol Use No  . Alcohol/week: 0.0 standard drinks     Social History   Substance and Sexual Activity  Drug Use No    Additional Social History:                           Allergies:   Allergies  Allergen Reactions  . Doxepin Other (See Comments)    Pt reports "that made me have seizures"  . Valproic Acid Shortness Of Breath  . Aspirin     REACTION: UNSPECIFIED  . Atorvastatin Other (See Comments)    Tongue swelling, legs cramping  . Baclofen     REACTION: Throat swells up  . Codeine     REACTION: UNSPECIFIED  . Duloxetine Other (See Comments)    Vision loss, weight loss  . Erythromycin      REACTION: UNSPECIFIED  . Gabapentin     REACTION: throat swells up  . Metoclopramide Hcl     REACTION: states "messed me up"  . Nortriptyline     Other reaction(s): Other (See Comments) Vision issues  . Nsaids Other (See Comments)    Stomach problems  . Nucynta Er [Tapentadol Hcl Er]     Intolerant, see note from 07/24/12.    Gean [place]ch[place] [Tapentadol]     AMS  . Olanzapine Nausea Only  . Prednisone     GI intolance  . Pregabalin   . Seroquel [Quetiapine Fumerate] Other (See Comments)  Loss control, felt like on another planet  . Sulfa Antibiotics Nausea And Vomiting  . Topamax Other (See Comments)    Blisters in mouth and tongue  . Vicodin [Hydrocodone-Acetaminophen]     Nausea, thrush and constipation   Lab Results:  Results for orders placed or performed during the hospital encounter of 03/24/20 (from the past 48 hour(s))  Comprehensive metabolic panel     Status: Abnormal   Collection Time: 03/24/20 12:41 PM  Result Value Ref Range   Sodium 141 135 - 145 mmol/L   Potassium 3.7 3.5 - 5.1 mmol/L   Chloride 105 98 - 111 mmol/L   CO2 27 22 - 32 mmol/L   Glucose, Bld 96 70 - 99 mg/dL    Comment: Glucose reference range applies only to samples taken after fasting for at least 8 hours.   BUN 11 8 - 23 mg/dL   Creatinine, Ser 0.62 0.44 - 1.00 mg/dL   Calcium 10.4 (H) 8.9 - 10.3 mg/dL   Total Protein 6.7 6.5 - 8.1 g/dL   Albumin 4.3 3.5 - 5.0 g/dL   AST 16 15 - 41 U/L   ALT 11 0 - 44 U/L   Alkaline Phosphatase 67 38 - 126 U/L   Total Bilirubin 0.9 0.3 - 1.2 mg/dL   GFR calc non Af Amer >60 >60 mL/min   GFR calc Af Amer >60 >60 mL/min   Anion gap 9 5 - 15    Comment: Performed at [facility], [address] [place], [place] [address]  CBC with Differential     Status: None   Collection Time: 03/24/20 12:41 PM  Result Value Ref Range   WBC 6.5 4.0 - 10.5 K/uL   RBC 4.58 3.87 - 5.11 MIL/uL   Hemoglobin 14.7 12.0 - 15.0 g/dL   HCT 42.8 36.0 - 46.0 %   MCV  93.4 80.0 - 100.0 fL   MCH 32.1 26.0 - 34.0 pg   MCHC 34.3 30.0 - 36.0 g/dL   RDW 13.1 11.5 - 15.5 %   Platelets 187 150 - 400 K/uL   nRBC 0.0 0.0 - 0.2 %   Neutrophils Relative % 57 %   Neutro Abs 3.7 1.7 - 7.7 K/uL   Lymphocytes Relative 35 %   Lymphs Abs 2.3 0.7 - 4.0 K/uL   Monocytes Relative 6 %   Monocytes Absolute 0.4 0.1 - 1.0 K/uL   Eosinophils Relative 1 %   Eosinophils Absolute 0.1 0.0 - 0.5 K/uL   Basophils Relative 1 %   Basophils Absolute 0.0 0.0 - 0.1 K/uL   Immature Granulocytes 0 %   Abs Immature Granulocytes 0.01 0.00 - 0.07 K/uL    Comment: Performed at [facility], [address] [place], [place] [address]  Ethanol     Status: None   Collection Time: 03/24/20 12:41 PM  Result Value Ref Range   Alcohol, Ethyl (B) <10 <10 mg/dL    Comment: (NOTE) Lowest detectable limit for serum alcohol is 10 mg/dL. For medical purposes only. Performed at [facility], [address] [place], [place] [address]   TSH     Status: None   Collection Time: 03/24/20 12:41 PM  Result Value Ref Range   TSH 2.119 0.350 - 4.500 uIU/mL    Comment: Performed by a 3rd Generation assay with a functional sensitivity of <=0.01 uIU/mL. Performed at [facility], [address], [place], [place] [address]   T4, free     Status:  None   Collection Time: 03/24/20 12:41 PM  Result Value Ref Range   Free T4 0.65 0.61 - 1.12 ng/dL    Comment: (NOTE) Biotin ingestion may interfere with free T4 tests. If the results are inconsistent with the TSH level, previous test results, or the clinical presentation, then consider biotin interference. If needed, order repeat testing after stopping biotin. Performed at [facility], [address] [place], [place] [address]   Lipase, blood     Status: None   Collection Time: 03/24/20 12:41 PM  Result Value Ref Range   Lipase 21 11 - 51 U/L    Comment: Performed at [facility], [address]  [place], [place] [address]  Urinalysis, Complete w Microscopic     Status: Abnormal   Collection Time: 03/24/20  3:17 PM  Result Value Ref Range   Color, Urine YELLOW (A) YELLOW   APPearance CLEAR (A) CLEAR   Specific Gravity, Urine 1.013 1.005 - 1.030   pH 6.0 5.0 - 8.0   Glucose, UA NEGATIVE NEGATIVE mg/dL   Hgb urine dipstick MODERATE (A) NEGATIVE   Bilirubin Urine NEGATIVE NEGATIVE   Ketones, ur 20 (A) NEGATIVE mg/dL   Protein, ur NEGATIVE NEGATIVE mg/dL   Nitrite NEGATIVE NEGATIVE   Leukocytes,Ua NEGATIVE NEGATIVE   RBC / HPF 0-5 0 - 5 RBC/hpf   WBC, UA 0-5 0 - 5 WBC/hpf   Bacteria, UA NONE SEEN NONE SEEN   Squamous Epithelial / LPF 0-5 0 - 5   Mucus PRESENT    Hyaline Casts, UA PRESENT     Comment: Performed at [facility], [address] [place], [place] [address]  Urine Drug Screen, Qualitative     Status: None   Collection Time: 03/24/20  3:17 PM  Result Value Ref Range   Tricyclic, Ur Screen NONE DETECTED NONE DETECTED   Amphetamines, Ur Screen NONE DETECTED NONE DETECTED   MDMA (Ecstasy)Ur Screen NONE DETECTED NONE DETECTED   Cocaine Metabolite,Ur [place] NONE DETECTED NONE DETECTED   Opiate, Ur Screen NONE DETECTED NONE DETECTED   Phencyclidine (PCP) Ur S NONE DETECTED NONE DETECTED   Cannabinoid 50 Ng, Ur [place] NONE DETECTED NONE DETECTED   Barbiturates, Ur Screen NONE DETECTED NONE DETECTED   Benzodiazepine, Ur Scrn NONE DETECTED NONE DETECTED   Methadone Scn, Ur NONE DETECTED NONE DETECTED    Comment: (NOTE) Tricyclics + metabolites, urine    Cutoff 1000 ng/mL Amphetamines + metabolites, urine  Cutoff 1000 ng/mL MDMA (Ecstasy), urine              Cutoff 500 ng/mL Cocaine Metabolite, urine          Cutoff 300 ng/mL Opiate + metabolites, urine        Cutoff 300 ng/mL Phencyclidine (PCP), urine         Cutoff 25 ng/mL Cannabinoid, urine                 Cutoff 50 ng/mL Barbiturates + metabolites, urine  Cutoff 200 ng/mL Benzodiazepine, urine              Cutoff  200 ng/mL Methadone, urine                   Cutoff 300 ng/mL The urine drug screen provides only a preliminary, unconfirmed analytical test result and should not be used for non-medical purposes. Clinical consideration and professional judgment should be applied to any positive drug screen result due to possible interfering substances. A more  specific alternate chemical method must be used in order to obtain a confirmed analytical result. Gas chromatography / mass spectrometry (GC/MS) is the preferred confirmat ory method. Performed at [facility], [address], [place], [place] [address]   Respiratory Panel by RT PCR (Flu A&B, Covid) - Urine, Clean Catch     Status: None   Collection Time: 03/24/20  3:17 PM   Specimen: Urine, Clean Catch  Result Value Ref Range   SARS Coronavirus 2 by RT PCR NEGATIVE NEGATIVE    Comment: (NOTE) SARS-CoV-2 target nucleic acids are NOT DETECTED. The SARS-CoV-2 RNA is generally detectable in upper respiratoy specimens during the acute phase of infection. The lowest concentration of SARS-CoV-2 viral copies this assay can detect is 131 copies/mL. A negative result does not preclude SARS-Cov-2 infection and should not be used as the sole basis for treatment or other patient management decisions. A negative result may occur with  improper specimen collection/handling, submission of specimen other than nasopharyngeal swab, presence of viral mutation(s) within the areas targeted by this assay, and inadequate number of viral copies (<131 copies/mL). A negative result must be combined with clinical observations, patient history, and epidemiological information. The expected result is Negative. Fact Sheet for Patients:  PinkCheek.be Fact Sheet for Healthcare Providers:  GravelBags.it This test is not yet ap proved or cleared by the [place] FDA and  has been authorized for detection  and/or diagnosis of SARS-CoV-2 by FDA under an Emergency Use Authorization (EUA). This EUA will remain  in effect (meaning this test can be used) for the duration of the COVID-19 declaration under Section 564(b)(1) of the Act, 21 U.S.C. section 360bbb-3(b)(1), unless the authorization is terminated or revoked sooner.    Influenza A by PCR NEGATIVE NEGATIVE   Influenza B by PCR NEGATIVE NEGATIVE    Comment: (NOTE) The Xpert Xpress SARS-CoV-2/FLU/RSV assay is intended as an aid in  the diagnosis of influenza from Nasopharyngeal swab specimens and  should not be used as a sole basis for treatment. Nasal washings and  aspirates are unacceptable for Xpert Xpress SARS-CoV-2/FLU/RSV  testing. Fact Sheet for Patients: PinkCheek.be Fact Sheet for Healthcare Providers: GravelBags.it This test is not yet approved or cleared by the [place] FDA and  has been authorized for detection and/or diagnosis of SARS-CoV-2 by  FDA under an Emergency Use Authorization (EUA). This EUA will remain  in effect (meaning this test can be used) for the duration of the  Covid-19 declaration under Section 564(b)(1) of the Act, 21  U.S.C. section 360bbb-3(b)(1), unless the authorization is  terminated or revoked. Performed at [facility], [address] [place], [place] [address]     Blood Alcohol level:  Lab Results  Component Value Date   [facility] <10 03/24/2020   ETH <10 123XX123    Metabolic Disorder Labs:  Lab Results  Component Value Date   HGBA1C 5.4 10/13/2018   MPG 108.28 10/13/2018   MPG 111.15 08/29/2018   No results found for: PROLACTIN Lab Results  Component Value Date   CHOL 182 01/03/2020   TRIG 278 (H) 01/03/2020   HDL 33 (L) 01/03/2020   CHOLHDL 5.5 01/03/2020   VLDL 56 (H) 01/03/2020   LDLCALC 93 01/03/2020   LDLCALC 103 (H) 10/13/2018    Current Medications: Current Facility-Administered Medications   Medication Dose Route Frequency Provider Last Rate Last Admin  . acetaminophen (TYLENOL) tablet 650 mg  650 mg Oral Q6H PRN Caroline Sauger, NP      .  alum & mag hydroxide-simeth (MAALOX/MYLANTA) 200-200-20 MG/5ML suspension 30 mL  30 mL Oral Q4H PRN Caroline Sauger, NP      . famotidine (PEPCID) tablet 20 mg  20 mg Oral BID Jin Capote T, MD      . feeding supplement (ENSURE ENLIVE) (ENSURE ENLIVE) liquid 237 mL  237 mL Oral BID BM Trampas Stettner T, MD      . feeding supplement (ENSURE ENLIVE) (ENSURE ENLIVE) liquid 237 mL  237 mL Oral BID BM El Pile T, MD      . hydrocerin (EUCERIN) cream   Topical BID Kedrick Mcnamee, [name], MD      . Derrill Memo ON 03/26/2020] levothyroxine (SYNTHROID) tablet 88 mcg  88 mcg Oral Q0600 Glora Hulgan T, MD      . magnesium hydroxide (MILK OF MAGNESIA) suspension 30 mL  30 mL Oral Daily PRN Caroline Sauger, NP      . Derrill Memo ON 03/26/2020] multivitamin with minerals tablet 1 tablet  1 tablet Oral Daily Fumi Guadron T, MD      . OLANZapine (ZYPREXA) tablet 10 mg  10 mg Oral QHS Zynia Wojtowicz T, MD      . ondansetron (ZOFRAN-ODT) disintegrating tablet 4 mg  4 mg Oral Q8H PRN Jiaire Rosebrook T, MD      . oxyCODONE (Oxy IR/ROXICODONE) immediate release tablet 5 mg  5 mg Oral TID Leola Fiore T, MD      . pantoprazole (PROTONIX) EC tablet 40 mg  40 mg Oral Daily Pennye Beeghly T, MD      . traZODone (DESYREL) tablet 50 mg  50 mg Oral QHS PRN Caroline Sauger, NP   50 mg at 03/25/20 0006   PTA Medications: Medications Prior to Admission  Medication Sig Dispense Refill Last Dose  . albuterol (VENTOLIN HFA) 108 (90 Base) MCG/ACT inhaler Inhale 2 puffs into the lungs every 6 (six) hours as needed for wheezing or shortness of breath. 18 g 11   . ascorbic acid (VITAMIN C) 500 MG tablet Take 1 tablet (500 mg total) by mouth daily. 30 tablet 1   . buPROPion (WELLBUTRIN) 75 MG tablet Take 1 tablet (75 mg total) by mouth daily. 30 tablet 1   . cholecalciferol  (VITAMIN D) 25 MCG (1000 UNIT) tablet Take 1 tablet (1,000 Units total) by mouth daily. 30 tablet 1   . famotidine (PEPCID) 20 MG tablet Take 1 tablet (20 mg total) by mouth 2 (two) times daily. 60 tablet 0   . lamoTRIgine (LAMICTAL) 25 MG tablet Take 2 tablets (50 mg total) by mouth daily. 60 tablet 1   . levothyroxine (SYNTHROID) 88 MCG tablet Take 1 tablet (88 mcg total) by mouth daily. 30 tablet 1   . OLANZapine (ZYPREXA) 20 MG tablet Take 1 tablet (20 mg total) by mouth at bedtime. 30 tablet 1   . propranolol (INDERAL) 10 MG tablet Take 1 tablet (10 mg total) by mouth 2 (two) times daily. 60 tablet 1   . SENOKOT 8.6 MG tablet Take 2 tablets by mouth daily.     . sucralfate (CARAFATE) 1 g tablet Take 1 tablet (1 g total) by mouth 4 (four) times daily. 120 tablet 1   . valACYclovir (VALTREX) 500 MG tablet TAKE 1 TABLET(500 MG) BY MOUTH TWICE DAILY 60 tablet 5   . vitamin B-12 (CYANOCOBALAMIN) 1000 MCG tablet Take 1 tablet (1,000 mcg total) by mouth daily. (Patient not taking: Reported on 08/27/2019) 90 tablet 3     Musculoskeletal: Strength & Muscle  Tone: within normal limits Gait & Station: normal Patient leans: N/A  Psychiatric Specialty Exam: Physical Exam  Nursing note and vitals reviewed. Constitutional: She appears well-developed and well-nourished.  HENT:  Head: Normocephalic and atraumatic.  Eyes: Pupils are equal, round, and reactive to light. Conjunctivae are normal.  Cardiovascular: Regular rhythm and normal heart sounds.  Respiratory: Effort normal. No respiratory distress.  GI: Soft.  Musculoskeletal:        General: Normal range of motion.     Cervical back: Normal range of motion.  Neurological: She is alert.  Skin: Skin is warm and dry.  Psychiatric: Her mood appears anxious. Her affect is blunt. Her speech is delayed. She is slowed and withdrawn. Cognition and memory are impaired. She expresses impulsivity. She exhibits a depressed mood. She expresses no suicidal  ideation.    Review of Systems  Constitutional: Negative.   HENT: Negative.   Eyes: Negative.   Respiratory: Negative.   Cardiovascular: Negative.   Gastrointestinal: Negative.   Musculoskeletal: Negative.   Skin: Negative.   Neurological: Negative.   Psychiatric/Behavioral: Positive for dysphoric mood and sleep disturbance. The patient is nervous/anxious.     Blood pressure 121/83, pulse 75, temperature 98.3 F (36.8 C), temperature source Oral, resp. rate 17, height 4\' 11"  (1.499 m), weight 38.1 kg, SpO2 99 %.Body mass index is 16.97 kg/m.  General Appearance: Disheveled  Eye Contact:  Fair  Speech:  Clear and Coherent  Volume:  Decreased  Mood:  Anxious, Depressed and Dysphoric  Affect:  Constricted  Thought Process:  Coherent  Orientation:  Full (Time, Place, and Person)  Thought Content:  Illogical, Paranoid Ideation, Rumination and Tangential  Suicidal Thoughts:  No  Homicidal Thoughts:  No  Memory:  Immediate;   Fair Recent;   Fair Remote;   Fair  Judgement:  Impaired  Insight:  Shallow  Psychomotor Activity:  Decreased  Concentration:  Concentration: Poor  Recall:  [employer] of Knowledge:  Fair  Language:  Fair  Akathisia:  No  Handed:  Right  AIMS (if indicated):     Assets:  Desire for Improvement Housing Resilience  ADL's:  Impaired  Cognition:  Impaired,  Mild  Sleep:  Number of Hours: 5.5    Treatment Plan Summary: Daily contact with patient to assess and evaluate symptoms and progress in treatment, Medication management and Plan Restart olanzapine.  Patient had concerns that she had been on "so many" medicines in the past.  I reviewed her most recent discharge and she really was not on very many psychiatric medicines.  We will not restart the Wellbutrin at this point and apparently her outpatient psychiatrist took her off of lamotrigine.  I am going to get her back on her medicine for her stomach acid which will probably help a lot with her eating  complaints.  Fortunately her thyroid test seem to be normal so she probably has stayed on her thyroid.  15-minute checks.  Engage in individual and group counseling and work on discharge planning  Observation Level/Precautions:  15 minute checks  Laboratory:  Chemistry Profile  Psychotherapy:    Medications:    Consultations:    Discharge Concerns:    Estimated LOS:  Other:     Physician Treatment Plan for Primary Diagnosis: Bipolar 1 disorder ([place]) Long Term Goal(s): Improvement in symptoms so as ready for discharge  Short Term Goals: Ability to verbalize feelings will improve and Ability to demonstrate self-control will improve  Physician Treatment Plan for Secondary Diagnosis:  Principal Problem:   Bipolar 1 disorder ([place]) Active Problems:   Hypothyroidism   Gastroesophageal reflux disease with hiatal hernia   Irritable bowel syndrome  Long Term Goal(s): Improvement in symptoms so as ready for discharge  Short Term Goals: Ability to identify and develop effective coping behaviors will improve, Ability to maintain clinical measurements within normal limits will improve and Compliance with prescribed medications will improve  I certify that inpatient services furnished can reasonably be expected to improve the patient's condition.    Alethia Berthold, MD 3/31/20214:03 PM

## 2020-03-25 NOTE — Progress Notes (Signed)
Recreation Therapy Notes  INPATIENT RECREATION THERAPY ASSESSMENT  Patient Details Name: Leslie Wong MRN: [id number] DOB: 1956-11-11 Today's Date: 03/25/2020       Information Obtained From: Patient  Able to Participate in Assessment/Interview: Yes  Patient Presentation: Responsive  Reason for Admission (Per Patient): Active Symptoms, Med Non-Compliance  Patient Stressors:    Coping Skills:   Talk, Prayer  Leisure Interests (2+):  (play with dog, play with grandchild)  Frequency of Recreation/Participation: Monthly  Awareness of Community Resources:     [employer]:     Current Use:    If no, Barriers?:    Expressed Interest in [place] of Residence:  Insurance underwriter  Patient Main Form of Transportation: Other (Comment)(My daughter)  Patient Strengths:  good heart  Patient Identified Areas of Improvement:  N/A  Patient Goal for Hospitalization:  To get well  Current SI (including self-harm):  No  Current HI:  No  Current AVH: No  Staff Intervention Plan: Group Attendance, Collaborate with Interdisciplinary Treatment Team  Consent to Intern Participation: N/A  Carletta Feasel 03/25/2020, 2:21 PM

## 2020-03-25 NOTE — Progress Notes (Signed)
Recreation Therapy Notes   Date: 03/25/2020  Time: 9:30 am   Location: Craft room   Behavioral response: N/A   Intervention Topic: Problem-Solving   Discussion/Intervention: Patient did not attend group.   Clinical Observations/Feedback:  Patient did not attend group.   Maxen Rowland LRT/CTRS        Betzabe Bevans 03/25/2020 11:18 AM

## 2020-03-25 NOTE — Plan of Care (Signed)
Pt new to the unit this evening  Problem: Education: Goal: Knowledge of [place] [facility] Education information/materials will improve Outcome: Not Progressing Goal: Emotional status will improve Outcome: Not Progressing Goal: Mental status will improve Outcome: Not Progressing Goal: Verbalization of understanding the information provided will improve Outcome: Not Progressing   Problem: Safety: Goal: Periods of time without injury will increase Outcome: Not Progressing   Problem: Education: Goal: Ability to state activities that reduce stress will improve Outcome: Not Progressing   Problem: Self-Concept: Goal: Ability to identify factors that promote anxiety will improve Outcome: Not Progressing Goal: Level of anxiety will decrease Outcome: Not Progressing Goal: Ability to modify response to factors that promote anxiety will improve Outcome: Not Progressing

## 2020-03-25 NOTE — BHH Group Notes (Signed)
Balance In Life 03/25/2020 1PM  Type of Therapy/Topic:  Group Therapy:  Balance in Life  Participation Level:  Did Not Attend  Description of Group:   This group will address the concept of balance and how it feels and looks when one is unbalanced. Patients will be encouraged to process areas in their lives that are out of balance and identify reasons for remaining unbalanced. Facilitators will guide patients in utilizing problem-solving interventions to address and correct the stressor making their life unbalanced. Understanding and applying boundaries will be explored and addressed for obtaining and maintaining a balanced life. Patients will be encouraged to explore ways to assertively make their unbalanced needs known to significant others in their lives, using other group members and facilitator for support and feedback.  Therapeutic Goals: 1. Patient will identify two or more emotions or situations they have that consume much of in their lives. 2. Patient will identify signs/triggers that life has become out of balance:  3. Patient will identify two ways to set boundaries in order to achieve balance in their lives:  4. Patient will demonstrate ability to communicate their needs through discussion and/or role plays  Summary of Patient Progress:    Therapeutic Modalities:   Cognitive Behavioral Therapy Solution-Focused Therapy Assertiveness Training  Franciso Dierks Lynelle Smoke, LCSW

## 2020-03-25 NOTE — Progress Notes (Signed)
Admission Note:  78yr female who presents IVC for treatment of depression and SI. Patient appears flat and depressed, she is restless,  she was offered emotional support and she was calm and cooperative with admission process. Patient denies SI/HI/AVH  and contracts for safety upon admission, she stated " I came here because l need help to feel better" patient c/o ABD discomfort and she stated she has always  experienced gastro intestinal issues. Patient's skin was assessed ABD is flat non distended. BS hypoactive  x 4 present, she denies flatus, no abnormal marks found, skin is warm and dry, she was searched and no contraband found, POC and unit policies explained and understanding verbalized and consents obtained. 15 minutes safety checks maintained will continue to monitor .

## 2020-03-25 NOTE — Progress Notes (Signed)
Initial Nutrition Assessment  DOCUMENTATION CODES:   Underweight  INTERVENTION:   Ensure Enlive po BID, each supplement provides 350 kcal and 20 grams of protein  Magic cup TID with meals, each supplement provides 290 kcal and 9 grams of protein  MVI daily  Pt likely at high refeed risk; recommend monitor K, Mg and P labs once oral intake improves.   NUTRITION DIAGNOSIS:   Inadequate oral intake related to social / environmental circumstances as evidenced by per patient/family report.  GOAL:   Patient will meet greater than or equal to 90% of their needs  MONITOR:   PO intake, Supplement acceptance  REASON FOR ASSESSMENT:   Malnutrition Screening Tool    ASSESSMENT:   64 y.o. female with a history of bipolar disorder, hypothyroidism, chronic pain, Parkinson's, GERD, gastroparesis and substance abuse who presents to the emergency department with weakness and poor oral intake   Pt is known to this RD from a recent previous admit. Pt with poor poor appetite and oral intake at baseline. Pt also with chronic nausea and vomiting; pt was supposed to have EGD in January but this was cancelled. Pt does have a h/o gastroparesis. Per chart, pt is down 16lbs(16%) from her UBW; if her admit weight is correct, this would be significant. Pt with poor appetite and oral intake in hospital. RD will add supplements and MVI to help pt meet her estimated needs. Pt is likely at refeed risk.   Pt at high risk for malnutrition  Medications and labs reviewed:   Diet Order:   Diet Order            Diet regular Room service appropriate? Yes; Fluid consistency: Thin  Diet effective now             EDUCATION NEEDS:   No education needs have been identified at this time  Skin:  Skin Assessment: Reviewed RN Assessment  Last BM:  3/30  Height:   Ht Readings from Last 1 Encounters:  03/24/20 4\' 11"  (1.499 m)    Weight:   Wt Readings from Last 1 Encounters:  03/24/20 38.1 kg     Ideal Body Weight:  44.5 kg  BMI:  Body mass index is 16.97 kg/m.  Estimated Nutritional Needs:   Kcal:  1300-1500kcal/day  Protein:  65-75g/day  Fluid:  >1.3L/day  Knox Saliva MS, RD, LDN Please refer to [facility] for RD and/or RD on-call/weekend/after hours pager

## 2020-03-25 NOTE — BHH Group Notes (Signed)
[facility] Group Notes:  (Nursing/MHT/Case Management/Adjunct)  Date:  03/25/2020  Time:  8:45 PM  Type of Therapy:  Wrap-Up Group  Participation Level:  Did Not Attend  Participation Quality:    Affect:    Cognitive:    Insight:    Engagement in Group:    Modes of Intervention:    Summary of Progress/Problems:  Anik Wesch 03/25/2020, 8:45 PM
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
## 2020-03-25 NOTE — Progress Notes (Signed)
Client alert.Denies anxiety, SI, HI, AVH. Depression rated 6/10 claims "always had" Client bedfast most of shift. Refused snacks and other offers of nutrition from Probation officer. C/o dry skin and offered lotion by  Probation officer, client refused.

## 2020-03-25 NOTE — Plan of Care (Signed)
Patient states " I did not take care myself.Now I cannot eat much." Patient seems anxious,depressed and hopeless.Denies SI,HI and AVH.Patient worries because she cannot eat or drink much.Encouraged for small frequent intakes.Support and encouragement given.

## 2020-03-25 NOTE — BHH Suicide Risk Assessment (Signed)
[facility] INPATIENT:  Family/Significant Other Suicide Prevention Education  Suicide Prevention Education:  Education Completed; Leslie Wong, daughter [id number] has been identified by the patient as the family member/significant other with whom the patient will be residing, and identified as the person(s) who will aid the patient in the event of a mental health crisis (suicidal ideations/suicide attempt).  With written consent from the patient, the family member/significant other has been provided the following suicide prevention education, prior to the and/or following the discharge of the patient.  The suicide prevention education provided includes the following:  Suicide risk factors  Suicide prevention and interventions  National Suicide Hotline telephone number  [facility] assessment telephone number  [facility] Emergency Assistance [place] and/or Residential Mobile Crisis Unit telephone number  Request made of family/significant other to:  Remove weapons (e.g., guns, rifles, knives), all items previously/currently identified as safety concern.    Remove drugs/medications (over-the-counter, prescriptions, illicit drugs), all items previously/currently identified as a safety concern.  The family member/significant other verbalizes understanding of the suicide prevention education information provided.  The family member/significant other agrees to remove the items of safety concern listed above. Ms. Leslie Wong reports the pt has had numerous psychiatric hospitalizations. She reports the pt came to the hospital because "she thinks something is physically wrong with her." She also states the pt has not been taking care of herself, reporting she is not eating properly or showering. Ms Leslie Wong states there has been several months of her neglecting her ADL's. She denies her having access to guns or weapons. Ms. Leslie Wong is in support of the pt going to an assisted  living facility. She says she has spoken with several of her medical providers and they have told her that she does not meet the requirements.  Leslie Wong T Lehman Prom 03/25/2020, 12:35 PM

## 2020-03-25 NOTE — Tx Team (Signed)
Initial Treatment Plan 03/25/2020 2:23 AM Leslie Wong [id number]    PATIENT STRESSORS: Health problems Medication change or noncompliance   PATIENT STRENGTHS: General fund of knowledge Motivation for treatment/growth Supportive family/friends   PATIENT IDENTIFIED PROBLEMS: Depression                      DISCHARGE CRITERIA:  Improved stabilization in mood, thinking, and/or behavior Medical problems require only outpatient monitoring  PRELIMINARY DISCHARGE PLAN: Outpatient therapy  PATIENT/FAMILY INVOLVEMENT: This treatment plan has been presented to and reviewed with the patient, Leslie Wong,  The patient and family have been given the opportunity to ask questions and make suggestions.  Harl Bowie, RN 03/25/2020, 2:23 AM

## 2020-03-25 NOTE — BHH Suicide Risk Assessment (Signed)
[facility] Admission Suicide Risk Assessment   Nursing information obtained from:  Patient Demographic factors:  NA Current Mental Status:  NA Loss Factors:  NA Historical Factors:  NA Risk Reduction Factors:  NA  Total Time spent with patient: 1 hour Principal Problem: Bipolar 1 disorder ([place]) Diagnosis:  Principal Problem:   Bipolar 1 disorder ([place]) Active Problems:   Hypothyroidism   Gastroesophageal reflux disease with hiatal hernia   Irritable bowel syndrome  Subjective Data: 64 year old woman with history of bipolar disorder presents with agitated depression and anxiety accompanied by weight loss malnutrition poor self-care.  Denies any current suicidal wish intent or plan.  Continued Clinical Symptoms:  Alcohol Use Disorder Identification Test Final Score (AUDIT): 0 The "Alcohol Use Disorders Identification Test", Guidelines for Use in Primary Care, Second Edition.  World Pharmacologist [facility]). Score between 0-7:  no or low risk or alcohol related problems. Score between 8-15:  moderate risk of alcohol related problems. Score between 16-19:  high risk of alcohol related problems. Score 20 or above:  warrants further diagnostic evaluation for alcohol dependence and treatment.   CLINICAL FACTORS:   Bipolar Disorder:   Mixed State Depression:   Anhedonia Hopelessness Impulsivity Insomnia   Musculoskeletal: Strength & Muscle Tone: atrophy Gait & Station: normal Patient leans: N/A  Psychiatric Specialty Exam: Physical Exam  Nursing note and vitals reviewed. Constitutional: She appears well-developed.  HENT:  Head: Normocephalic and atraumatic.  Eyes: Pupils are equal, round, and reactive to light. Conjunctivae are normal.  Cardiovascular: Regular rhythm and normal heart sounds.  Respiratory: Effort normal.  GI: Soft.  Musculoskeletal:        General: Normal range of motion.     Cervical back: Normal range of motion.  Neurological: She is alert.  Skin: Skin is  warm and dry.  Psychiatric: Her mood appears anxious. Her speech is delayed. She is slowed and withdrawn. Thought content is paranoid. Cognition and memory are impaired. She expresses inappropriate judgment. She exhibits a depressed mood. She expresses no homicidal and no suicidal ideation.    Review of Systems  Constitutional: Negative.   HENT: Negative.   Eyes: Negative.   Respiratory: Negative.   Cardiovascular: Negative.   Gastrointestinal: Negative.   Musculoskeletal: Negative.   Skin: Negative.   Neurological: Negative.   Psychiatric/Behavioral: Positive for behavioral problems, confusion, dysphoric mood and sleep disturbance.    Blood pressure 121/83, pulse 75, temperature 98.3 F (36.8 C), temperature source Oral, resp. rate 17, height 4\' 11"  (1.499 m), weight 38.1 kg, SpO2 99 %.Body mass index is 16.97 kg/m.  General Appearance: Disheveled  Eye Contact:  Fair  Speech:  Slow  Volume:  Decreased  Mood:  Anxious, Depressed and Dysphoric  Affect:  Constricted  Thought Process:  Disorganized  Orientation:  Negative  Thought Content:  Illogical and Rumination  Suicidal Thoughts:  No  Homicidal Thoughts:  No  Memory:  Immediate;   Fair Recent;   Poor Remote;   Poor  Judgement:  Impaired  Insight:  Shallow  Psychomotor Activity:  Decreased  Concentration:  Concentration: Fair  Recall:  [employer] of Knowledge:  Fair  Language:  Fair  Akathisia:  No  Handed:  Right  AIMS (if indicated):     Assets:  Desire for Improvement Housing Resilience  ADL's:  Impaired  Cognition:  Impaired,  Mild  Sleep:  Number of Hours: 5.5      COGNITIVE FEATURES THAT CONTRIBUTE TO RISK:  Loss of executive function and Polarized thinking  SUICIDE RISK:   Mild:  Suicidal ideation of limited frequency, intensity, duration, and specificity.  There are no identifiable plans, no associated intent, mild dysphoria and related symptoms, good self-control (both objective and subjective  assessment), few other risk factors, and identifiable protective factors, including available and accessible social support.  PLAN OF CARE: Supportive counseling and review of treatment plan.  Restart medication.  Encourage p.o. intake.  Monitor for appropriate and safe behavior.  Include in individual and group therapy.  Encourage patient to commit to outpatient treatment  I certify that inpatient services furnished can reasonably be expected to improve the patient's condition.   Alethia Berthold, MD 03/25/2020, 3:56 PM

## 2020-03-26 MED ORDER — BIOTENE DRY MOUTH MT LIQD: 15.0000 mL | OROMUCOSAL | Status: DC | PRN

## 2020-03-26 MED ORDER — OLANZAPINE 5 MG PO TBDP
10.0000 mg | ORAL_TABLET | Freq: Every day | ORAL | Status: DC
  Administered 2020-03-26 – 2020-03-28 (×3): 10 mg via ORAL
  Filled 2020-03-26 (×3): qty 2

## 2020-03-26 MED ORDER — ENSURE ENLIVE PO LIQD
237.0000 mL | Freq: Three times a day (TID) | ORAL | Status: DC
  Administered 2020-03-26 – 2020-04-03 (×18): 237 mL via ORAL

## 2020-03-26 NOTE — Plan of Care (Signed)
Pt presents with a better affect than when she was admitted, states that she had a good day and was feeling better.   Problem: Education: Goal: Emotional status will improve Outcome: Progressing Goal: Mental status will improve Outcome: Progressing

## 2020-03-26 NOTE — BHH Group Notes (Signed)

## 2020-03-26 NOTE — BHH Group Notes (Signed)
[facility] Group Notes:  (Nursing/MHT/Case Management/Adjunct)  Date:  03/26/2020  Time:  9:58 PM  Type of Therapy:  Group Therapy  Participation Level:  Did Not Attend   Leslie Wong 03/26/2020, 9:58 PM

## 2020-03-26 NOTE — Progress Notes (Signed)
F - Mood stabilization, anxiety management, and improved self-care/intake of food.  Decrease in delusions.  D - Patient endorsing an inability to eat or drink due to a dehydrated throat.  Appears anxious and worried, isolating in her room.  Occasionally, the patient would ambulate the hallways and join others in the milieu.  Leslie Wong presenting as delusional.  No safety issues observed, but the patient is a MODERATE FALL RISK due to a number of factors.    A - Therapeutic interaction, medications, and support provided.  The patient accepted some of her medications and refused others.  Oxy refused and then held, as the patient did not endorse pain or discomfort after refusal.  The patient was encouraged to eat and supervision during meals was provided.  Only a limited diet was consumed, including a few spoons of mashed potatoes and soup.  Ensure provided by the patient request at 1500.  Leslie Wong was more accepting of increasing her oral intake as the shift progressed.    R - Diet slowly improving and pscyh medications accpted.  Change to Zydis ordered in case Leslie Wong refuses Zyprexa due to concerns with swallowing.

## 2020-03-26 NOTE — Progress Notes (Signed)
Recreation Therapy Notes  Date: 03/26/2020  Time: 9:30 am   Location: Room 21   Behavioral response: N/A   Intervention Topic: Animal Assisted therapy    Discussion/Intervention: Patient did not attend group.   Clinical Observations/Feedback:  Patient did not attend group.   Savana Spina LRT/CTRS         Catori Panozzo 03/26/2020 10:55 AM

## 2020-03-26 NOTE — Tx Team (Addendum)
Interdisciplinary Treatment and Diagnostic Plan Update  03/26/2020 Time of Session: 9:00 Leslie Wong MRN: [id number]  Principal Diagnosis: Bipolar 1 disorder ([place])  Secondary Diagnoses: Principal Problem:   Bipolar 1 disorder ([place]) Active Problems:   Hypothyroidism   Gastroesophageal reflux disease with hiatal hernia   Irritable bowel syndrome   Current Medications:  Current Facility-Administered Medications  Medication Dose Route Frequency Provider Last Rate Last Admin  . acetaminophen (TYLENOL) tablet 650 mg  650 mg Oral Q6H PRN Caroline Sauger, NP      . alum & mag hydroxide-simeth (MAALOX/MYLANTA) 200-200-20 MG/5ML suspension 30 mL  30 mL Oral Q4H PRN Caroline Sauger, NP      . antiseptic oral rinse (BIOTENE) solution 15 mL  15 mL Mouth Rinse PRN Money, Lowry Ram, FNP      . famotidine (PEPCID) tablet 20 mg  20 mg Oral BID [name], [name], MD   20 mg at 03/25/20 1612  . feeding supplement (ENSURE ENLIVE) (ENSURE ENLIVE) liquid 237 mL  237 mL Oral BID BM [name], [name], MD   237 mL at 03/25/20 1612  . feeding supplement (ENSURE ENLIVE) (ENSURE ENLIVE) liquid 237 mL  237 mL Oral BID BM [name], [name], MD   237 mL at 03/25/20 1707  . hydrocerin (EUCERIN) cream   Topical BID [name], [name], MD   Given at 03/25/20 1549  . levothyroxine (SYNTHROID) tablet 88 mcg  88 mcg Oral Q0600 [name], [name], MD   88 mcg at 03/26/20 0654  . magnesium hydroxide (MILK OF MAGNESIA) suspension 30 mL  30 mL Oral Daily PRN Caroline Sauger, NP      . multivitamin with minerals tablet 1 tablet  1 tablet Oral Daily [name], [name], MD      . OLANZapine zydis (ZYPREXA) disintegrating tablet 10 mg  10 mg Oral QHS Money, Darnelle Maffucci B, FNP      . ondansetron (ZOFRAN-ODT) disintegrating tablet 4 mg  4 mg Oral Q8H PRN [name], [name], MD      . oxyCODONE (Oxy IR/ROXICODONE) immediate release tablet 5 mg  5 mg Oral TID [name], [name], MD   5 mg at 03/25/20 1612  . pantoprazole (PROTONIX) EC  tablet 40 mg  40 mg Oral Daily [name], [name], MD   40 mg at 03/25/20 1612  . traZODone (DESYREL) tablet 50 mg  50 mg Oral QHS PRN Caroline Sauger, NP   50 mg at 03/25/20 0006   PTA Medications: Medications Prior to Admission  Medication Sig Dispense Refill Last Dose  . albuterol (VENTOLIN HFA) 108 (90 Base) MCG/ACT inhaler Inhale 2 puffs into the lungs every 6 (six) hours as needed for wheezing or shortness of breath. 18 g 11   . ascorbic acid (VITAMIN C) 500 MG tablet Take 1 tablet (500 mg total) by mouth daily. 30 tablet 1   . buPROPion (WELLBUTRIN) 75 MG tablet Take 1 tablet (75 mg total) by mouth daily. 30 tablet 1   . cholecalciferol (VITAMIN D) 25 MCG (1000 UNIT) tablet Take 1 tablet (1,000 Units total) by mouth daily. 30 tablet 1   . famotidine (PEPCID) 20 MG tablet Take 1 tablet (20 mg total) by mouth 2 (two) times daily. 60 tablet 0   . lamoTRIgine (LAMICTAL) 25 MG tablet Take 2 tablets (50 mg total) by mouth daily. 60 tablet 1   . levothyroxine (SYNTHROID) 88 MCG tablet Take 1 tablet (88 mcg total) by mouth daily. 30 tablet 1   . OLANZapine (  ZYPREXA) 20 MG tablet Take 1 tablet (20 mg total) by mouth at bedtime. 30 tablet 1   . propranolol (INDERAL) 10 MG tablet Take 1 tablet (10 mg total) by mouth 2 (two) times daily. 60 tablet 1   . SENOKOT 8.6 MG tablet Take 2 tablets by mouth daily.     . sucralfate (CARAFATE) 1 g tablet Take 1 tablet (1 g total) by mouth 4 (four) times daily. 120 tablet 1   . valACYclovir (VALTREX) 500 MG tablet TAKE 1 TABLET(500 MG) BY MOUTH TWICE DAILY 60 tablet 5   . vitamin B-12 (CYANOCOBALAMIN) 1000 MCG tablet Take 1 tablet (1,000 mcg total) by mouth daily. 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   Physician Treatment Plan for Primary Diagnosis: Bipolar 1 disorder ([place]) Long Term Goal(s): Improvement in symptoms so as ready for discharge Improvement in symptoms so as ready for discharge   Short Term Goals: Ability to verbalize feelings will improve Ability to demonstrate self-control will improve Ability to identify and develop effective coping behaviors will improve Ability to maintain clinical measurements within normal limits will improve Compliance with prescribed medications will improve  Medication Management: Evaluate patient's response, side effects, and tolerance of medication regimen.  Therapeutic Interventions: 1 to 1 sessions, Unit Group sessions and Medication administration.  Evaluation of Outcomes: Not Progressing  Physician Treatment Plan for Secondary Diagnosis: Principal Problem:   Bipolar 1 disorder ([place]) Active Problems:   Hypothyroidism   Gastroesophageal reflux disease with hiatal hernia   Irritable bowel syndrome  Long Term Goal(s): Improvement in symptoms so as ready for discharge Improvement in symptoms so as ready for discharge   Short Term Goals: Ability to verbalize feelings will improve Ability to demonstrate self-control will improve Ability to identify and develop effective coping behaviors will improve Ability to maintain clinical measurements within normal limits will improve Compliance with prescribed medications will improve     Medication Management: Evaluate patient's response, side effects, and tolerance of medication regimen.  Therapeutic Interventions: 1 to 1 sessions, Unit Group sessions and Medication administration.  Evaluation of Outcomes: Not Progressing   RN Treatment Plan for Primary Diagnosis: Bipolar 1 disorder ([place]) Long Term Goal(s): Knowledge of disease and therapeutic regimen to maintain health will improve  Short Term  Goals: Ability to demonstrate self-control, Ability to participate in decision making will improve, Ability to verbalize feelings will improve, Ability to disclose and discuss suicidal ideas, Ability to identify and develop effective coping behaviors will improve and Compliance with prescribed medications will improve  Medication Management: RN will administer medications as ordered by provider, will assess and evaluate patient's response and provide education to patient for prescribed medication. RN will report any adverse and/or side effects to prescribing provider.  Therapeutic Interventions: 1 on 1 counseling sessions, Psychoeducation, Medication administration, Evaluate responses to treatment, Monitor vital signs and CBGs as ordered, Perform/monitor CIWA, COWS, AIMS and Fall Risk screenings as ordered, Perform wound care treatments as ordered.  Evaluation of Outcomes: Not Progressing   LCSW Treatment Plan for Primary Diagnosis: Bipolar 1 disorder ([place]) Long Term Goal(s): Safe transition to appropriate next level of care at discharge, Engage patient in therapeutic group addressing interpersonal concerns.  Short Term Goals: Engage patient in aftercare planning  with referrals and resources, Increase social support, Increase ability to appropriately verbalize feelings, Increase emotional regulation, Facilitate acceptance of mental health diagnosis and concerns and Increase skills for wellness and recovery  Therapeutic Interventions: Assess for all discharge needs, 1 to 1 time with Social worker, Explore available resources and support systems, Assess for adequacy in community support network, Educate family and significant other(s) on suicide prevention, Complete Psychosocial Assessment, Interpersonal group therapy.  Evaluation of Outcomes: Not Progressing   Progress in Treatment: Attending groups: No. Participating in groups: No. Taking medication as prescribed: No. Toleration medication:  No. Family/Significant other contact made: Yes, individual(s) contacted:  SPE completed with the patient's daughter. Patient understands diagnosis: No. Discussing patient identified problems/goals with staff: No. Medical problems stabilized or resolved: Yes. Denies suicidal/homicidal ideation: Yes. Issues/concerns per patient self-inventory: No. Other: none  New problem(s) identified: No, Describe:  none  New Short Term/Long Term Goal(s):  elimination of symptoms of psychosis, medication management for mood stabilization; elimination of SI thoughts; development of comprehensive mental wellness plan.  Patient Goals:  Patient was give the opportunity to attend treatment team, however declined.  CSW will assist pt in developing appropriate discharge plans.   Discharge Plan or Barriers: Patient was give the opportunity to attend treatment team, however declined.  CSW will assist pt in developing appropriate discharge plans.    Reason for Continuation of Hospitalization: Anxiety Depression Medication stabilization  Estimated Length of Stay:  1-7 days  Recreational Therapy: Patient Stressors: N/A Patient Goal: Patient will engage in groups without prompting or encouragement from LRT x3 group sessions within 5 recreation therapy group sessions  Attendees: Patient:  03/26/2020 9:58 AM  Physician: Dr. Weber Cooks, MD 03/26/2020 9:58 AM  Nursing: Collier Bullock, RN 03/26/2020 9:58 AM  RN Care Manager: 03/26/2020 9:58 AM  Social Worker: Assunta Curtis, LCSW 03/26/2020 9:58 AM  Recreational Therapist: Roanna Epley, Reather Converse, LRT 03/26/2020 9:58 AM  Other: Evalina Field, LCSW 03/26/2020 9:58 AM  Other:  03/26/2020 9:58 AM  Other: 03/26/2020 9:58 AM    Scribe for Treatment Team: Rozann Lesches, LCSW 03/26/2020 9:58 AM

## 2020-03-26 NOTE — Plan of Care (Signed)
Patient isolating in room.   Problem: Self-Concept: Goal: Level of anxiety will decrease Outcome: Not Progressing Goal: Ability to modify response to factors that promote anxiety will improve Outcome: Not Progressing

## 2020-03-26 NOTE — Progress Notes (Addendum)
Patient was in her room upon arrival to the unit. Patient was pleasant during assessment denying SI/HI/AVH. Patient stated she was in pain 5/10 (see MAR). Pt stated she was feeling better today and presented with a better affect than when this writer helped with her admission two days ago. Pt compliant with medication administration per MD orders. Patient given support and encouragement to be active in her treatment plan. Patient given education. Patient being monitored Q 15 minutes for safety per unit protocol, pt remains safe on the unit.

## 2020-03-26 NOTE — Progress Notes (Signed)
[facility] MD Progress Note  03/26/2020 2:14 PM Leslie Wong  MRN:  [id number]   Subjective: Follow-up for this 64 year old female diagnosed with bipolar 1 disorder.  Patient reports that she is not doing good today.  She states that she is not able to eat or drink anything.  She reports that her body is dispensing of the fluids in her body and it is dehydrating her.  She does report that she is urinated and does not report that she has had any types of bowel movements or vomiting.  She reports that she is unable to drink anything and has not been able to eat much.  She states she is only 8 and a couple of bites at each meal.  She reports that she was given Gatorade and is only drinking very little of it.  She states that she does not know what to do and does not feel like things are going to get any better for her.  She reports that she was unable to take her medications this morning.  Patient does deny having any suicidal or homicidal ideations and denies any hallucinations.  However patient continues to talk about being unable to eat or drink and that her body is dehydrating itself.  Principal Problem: Bipolar 1 disorder ([place]) Diagnosis: Principal Problem:   Bipolar 1 disorder (HCC) Active Problems:   Hypothyroidism   Gastroesophageal reflux disease with hiatal hernia   Irritable bowel syndrome  Total Time spent with patient: 30 minutes  Past Psychiatric History: Patient has had multiple hospitalizations with a diagnosis of bipolar disorder.  Complicated often by substance abuse.  Has had periods of delirious psychosis in the past but right now and on her last admission seems to be doing slightly better.  Has struggled with any kind of follow-up especially as she lives by herself.  Does have a past history of suicidality.  Past Medical History:  Past Medical History:  Diagnosis Date  . Back pain   . Cancer ([place])    skin  . Depression    BAD  . Gastroparesis   . GERD (gastroesophageal  reflux disease)   . Hypothyroidism   . Insomnia   . Migraines   . Parkinson disease ([place])    per [place] [facility], dx'd 2019  . Recurrent cold sores   . Shoulder bursitis   . Urge incontinence     Past Surgical History:  Procedure Laterality Date  . ABDOMINAL HYSTERECTOMY  1999   total  . APPENDECTOMY    . ESOPHAGOGASTRODUODENOSCOPY  01/2006   negative except small hiatal hernia  . ESOPHAGOGASTRODUODENOSCOPY  02/24/2015   See report  . LAPAROSCOPY     for endometriosis  . OVARIAN CYST REMOVAL  1990   unilateral  . TONSILLECTOMY AND ADENOIDECTOMY     Family History:  Family History  Problem Relation Age of Onset  . Hypertension Mother   . Arthritis Mother   . Diabetes Mother   . Stroke Mother   . Cancer Father        lung  . Colon cancer Neg Hx   . Breast cancer Neg Hx    Family Psychiatric  History: None reported Social History:  Social History   Substance and Sexual Activity  Alcohol Use No  . Alcohol/week: 0.0 standard drinks     Social History   Substance and Sexual Activity  Drug Use No    Social History   Socioeconomic History  . Marital status: Divorced    Spouse  name: Not on file  . Number of children: Not on file  . Years of education: Not on file  . Highest education level: Not on file  Occupational History  . Not on file  Tobacco Use  . Smoking status: Current Some Day Smoker    Packs/day: 1.00    Years: 42.50    Pack years: 42.50    Types: Cigarettes  . Smokeless tobacco: Former Systems developer  . Tobacco comment: declined  Substance and Sexual Activity  . Alcohol use: No    Alcohol/week: 0.0 standard drinks  . Drug use: No  . Sexual activity: Never  Other Topics Concern  . Not on file  Social History Narrative   Lives alone.     Has a dog names Dixie   Social Determinants of Health   Financial Resource Strain:   . Difficulty of Paying Living Expenses:   Food Insecurity:   . Worried About Charity fundraiser in the Last Year:   . Youth worker in the Last Year:   Transportation Needs:   . Film/video editor (Medical):   Marland Kitchen Lack of Transportation (Non-Medical):   Physical Activity:   . Days of Exercise per Week:   . Minutes of Exercise per Session:   Stress:   . Feeling of Stress :   Social Connections:   . Frequency of Communication with Friends and Family:   . Frequency of Social Gatherings with Friends and Family:   . Attends Religious Services:   . Active Member of Clubs or Organizations:   . Attends Archivist Meetings:   Marland Kitchen Marital Status:    Additional Social History:                         Sleep: Good  Appetite:  Poor  Current Medications: Current Facility-Administered Medications  Medication Dose Route Frequency Provider Last Rate Last Admin  . acetaminophen (TYLENOL) tablet 650 mg  650 mg Oral Q6H PRN Caroline Sauger, NP      . alum & mag hydroxide-simeth (MAALOX/MYLANTA) 200-200-20 MG/5ML suspension 30 mL  30 mL Oral Q4H PRN Caroline Sauger, NP      . antiseptic oral rinse (BIOTENE) solution 15 mL  15 mL Mouth Rinse PRN Jordanna Hendrie, Lowry Ram, FNP      . famotidine (PEPCID) tablet 20 mg  20 mg Oral BID [name], [name], MD   20 mg at 03/25/20 1612  . feeding supplement (ENSURE ENLIVE) (ENSURE ENLIVE) liquid 237 mL  237 mL Oral BID BM [name], [name], MD   237 mL at 03/25/20 1612  . feeding supplement (ENSURE ENLIVE) (ENSURE ENLIVE) liquid 237 mL  237 mL Oral BID BM [name], [name], MD   237 mL at 03/25/20 1707  . hydrocerin (EUCERIN) cream   Topical BID [name], [name], MD   Given at 03/25/20 1549  . levothyroxine (SYNTHROID) tablet 88 mcg  88 mcg Oral Q0600 [name], [name], MD   88 mcg at 03/26/20 0654  . magnesium hydroxide (MILK OF MAGNESIA) suspension 30 mL  30 mL Oral Daily PRN Caroline Sauger, NP      . multivitamin with minerals tablet 1 tablet  1 tablet Oral Daily [name], [name], MD      . OLANZapine zydis (ZYPREXA) disintegrating tablet 10 mg  10 mg Oral QHS  Chaze Hruska B, FNP      . ondansetron (ZOFRAN-ODT) disintegrating tablet 4 mg  4 mg  Oral Q8H PRN [name], [name], MD      . oxyCODONE (Oxy IR/ROXICODONE) immediate release tablet 5 mg  5 mg Oral TID [name], [name], MD   5 mg at 03/25/20 1612  . pantoprazole (PROTONIX) EC tablet 40 mg  40 mg Oral Daily [name], [name], MD   40 mg at 03/25/20 1612  . traZODone (DESYREL) tablet 50 mg  50 mg Oral QHS PRN Caroline Sauger, NP   50 mg at 03/25/20 0006    Lab Results:  Results for orders placed or performed during the hospital encounter of 03/24/20 (from the past 48 hour(s))  Urinalysis, Complete w Microscopic     Status: Abnormal   Collection Time: 03/24/20  3:17 PM  Result Value Ref Range   Color, Urine YELLOW (A) YELLOW   APPearance CLEAR (A) CLEAR   Specific Gravity, Urine 1.013 1.005 - 1.030   pH 6.0 5.0 - 8.0   Glucose, UA NEGATIVE NEGATIVE mg/dL   Hgb urine dipstick MODERATE (A) NEGATIVE   Bilirubin Urine NEGATIVE NEGATIVE   Ketones, ur 20 (A) NEGATIVE mg/dL   Protein, ur NEGATIVE NEGATIVE mg/dL   Nitrite NEGATIVE NEGATIVE   Leukocytes,Ua NEGATIVE NEGATIVE   RBC / HPF 0-5 0 - 5 RBC/hpf   WBC, UA 0-5 0 - 5 WBC/hpf   Bacteria, UA NONE SEEN NONE SEEN   Squamous Epithelial / LPF 0-5 0 - 5   Mucus PRESENT    Hyaline Casts, UA PRESENT     Comment: Performed at [facility], [address] [place], [place] [address]  Urine Drug Screen, Qualitative     Status: None   Collection Time: 03/24/20  3:17 PM  Result Value Ref Range   Tricyclic, Ur Screen NONE DETECTED NONE DETECTED   Amphetamines, Ur Screen NONE DETECTED NONE DETECTED   MDMA (Ecstasy)Ur Screen NONE DETECTED NONE DETECTED   Cocaine Metabolite,Ur [place] NONE DETECTED NONE DETECTED   Opiate, Ur Screen NONE DETECTED NONE DETECTED   Phencyclidine (PCP) Ur S NONE DETECTED NONE DETECTED   Cannabinoid 50 Ng, Ur [place] NONE DETECTED NONE DETECTED   Barbiturates, Ur Screen NONE DETECTED NONE DETECTED   Benzodiazepine, Ur  Scrn NONE DETECTED NONE DETECTED   Methadone Scn, Ur NONE DETECTED NONE DETECTED    Comment: (NOTE) Tricyclics + metabolites, urine    Cutoff 1000 ng/mL Amphetamines + metabolites, urine  Cutoff 1000 ng/mL MDMA (Ecstasy), urine              Cutoff 500 ng/mL Cocaine Metabolite, urine          Cutoff 300 ng/mL Opiate + metabolites, urine        Cutoff 300 ng/mL Phencyclidine (PCP), urine         Cutoff 25 ng/mL Cannabinoid, urine                 Cutoff 50 ng/mL Barbiturates + metabolites, urine  Cutoff 200 ng/mL Benzodiazepine, urine              Cutoff 200 ng/mL Methadone, urine                   Cutoff 300 ng/mL The urine drug screen provides only a preliminary, unconfirmed analytical test result and should not be used for non-medical purposes. Clinical consideration and professional judgment should be applied to any positive drug screen result due to possible interfering substances. A more specific alternate chemical method must be used in order to obtain a confirmed analytical result.  Gas chromatography / mass spectrometry (GC/MS) is the preferred confirmat ory method. Performed at [facility], [address] [place], [place] [address]   Respiratory Panel by RT PCR (Flu A&B, Covid) - Urine, Clean Catch     Status: None   Collection Time: 03/24/20  3:17 PM   Specimen: Urine, Clean Catch  Result Value Ref Range   SARS Coronavirus 2 by RT PCR NEGATIVE NEGATIVE    Comment: (NOTE) SARS-CoV-2 target nucleic acids are NOT DETECTED. The SARS-CoV-2 RNA is generally detectable in upper respiratoy specimens during the acute phase of infection. The lowest concentration of SARS-CoV-2 viral copies this assay can detect is 131 copies/mL. A negative result does not preclude SARS-Cov-2 infection and should not be used as the sole basis for treatment or other patient management decisions. A negative result may occur with  improper specimen collection/handling, submission of specimen  other than nasopharyngeal swab, presence of viral mutation(s) within the areas targeted by this assay, and inadequate number of viral copies (<131 copies/mL). A negative result must be combined with clinical observations, patient history, and epidemiological information. The expected result is Negative. Fact Sheet for Patients:  PinkCheek.be Fact Sheet for Healthcare Providers:  GravelBags.it This test is not yet ap proved or cleared by the [place] FDA and  has been authorized for detection and/or diagnosis of SARS-CoV-2 by FDA under an Emergency Use Authorization (EUA). This EUA will remain  in effect (meaning this test can be used) for the duration of the COVID-19 declaration under Section 564(b)(1) of the Act, 21 U.S.C. section 360bbb-3(b)(1), unless the authorization is terminated or revoked sooner.    Influenza A by PCR NEGATIVE NEGATIVE   Influenza B by PCR NEGATIVE NEGATIVE    Comment: (NOTE) The Xpert Xpress SARS-CoV-2/FLU/RSV assay is intended as an aid in  the diagnosis of influenza from Nasopharyngeal swab specimens and  should not be used as a sole basis for treatment. Nasal washings and  aspirates are unacceptable for Xpert Xpress SARS-CoV-2/FLU/RSV  testing. Fact Sheet for Patients: PinkCheek.be Fact Sheet for Healthcare Providers: GravelBags.it This test is not yet approved or cleared by the [place] FDA and  has been authorized for detection and/or diagnosis of SARS-CoV-2 by  FDA under an Emergency Use Authorization (EUA). This EUA will remain  in effect (meaning this test can be used) for the duration of the  Covid-19 declaration under Section 564(b)(1) of the Act, 21  U.S.C. section 360bbb-3(b)(1), unless the authorization is  terminated or revoked. Performed at [facility], [address] [place], [place] [address]      Blood Alcohol level:  Lab Results  Component Value Date   [facility] <10 03/24/2020   ETH <10 123XX123    Metabolic Disorder Labs: Lab Results  Component Value Date   HGBA1C 5.4 10/13/2018   MPG 108.28 10/13/2018   MPG 111.15 08/29/2018   No results found for: PROLACTIN Lab Results  Component Value Date   CHOL 182 01/03/2020   TRIG 278 (H) 01/03/2020   HDL 33 (L) 01/03/2020   CHOLHDL 5.5 01/03/2020   VLDL 56 (H) 01/03/2020   LDLCALC 93 01/03/2020   LDLCALC 103 (H) 10/13/2018    Physical Findings: AIMS:  , ,  ,  ,    CIWA:    COWS:     Musculoskeletal: Strength & Muscle Tone: decreased Gait & Station: Patient remained in bed, but reported to be unsteady Patient leans: N/A  Psychiatric Specialty Exam: Physical Exam  Nursing  note and vitals reviewed. Constitutional: She appears well-developed and well-nourished.  Cardiovascular: Normal rate.  Respiratory: Effort normal.  Musculoskeletal:        General: Normal range of motion.  Neurological: She is alert.  Skin: Skin is warm.  Psychiatric: She is slowed and withdrawn. Thought content is paranoid and delusional. She exhibits a depressed mood.    Review of Systems  Constitutional: Positive for fatigue.  HENT: Negative.   Eyes: Negative.   Respiratory: Negative.   Cardiovascular: Negative.   Gastrointestinal: Negative.   Genitourinary: Negative.   Musculoskeletal: Negative.   Skin: Negative.   Neurological: Negative.   Psychiatric/Behavioral:       Numerous psychosomatic complaints    Blood pressure (!) 133/101, pulse 74, temperature 97.8 F (36.6 C), temperature source Oral, resp. rate 17, height 4\' 11"  (1.499 m), weight 38.1 kg, SpO2 99 %.Body mass index is 16.97 kg/m.  General Appearance: Disheveled  Eye Contact:  Fair  Speech:  Slow  Volume:  Decreased  Mood:  Dysphoric and Hopeless  Affect:  Depressed  Thought Process:  Disorganized and Descriptions of Associations: Tangential  Orientation:   Full (Time, Place, and Person)  Thought Content:  Illogical, Paranoid Ideation, Rumination, Tangential and Abstract Reasoning  Suicidal Thoughts:  No  Homicidal Thoughts:  No  Memory:  Immediate;   Poor Recent;   Poor Remote;   Poor  Judgement:  Impaired  Insight:  Shallow  Psychomotor Activity:  Decreased  Concentration:  Concentration: Poor  Recall:  Poor  Fund of Knowledge:  Poor  Language:  Fair  Akathisia:  No  Handed:  Right  AIMS (if indicated):     Assets:  Financial Resources/Insurance Housing Resilience  ADL's:  Impaired  Cognition:  Impaired,  Mild  Sleep:  Number of Hours: 8   Assessment: Patient presents in her room laying down in the bed but is awake.  Patient does sit up on the side of the bed but appears to be weak.  After discussing with the nurse and reviewing the chart the patient did take her Zyprexa last night and also took her Synthroid this morning but then reported to the nurse that she was unable to swallow anything this morning for her other medications.  Patient is continued to be focused on the delusion that she is unable to eat or drink and reporting having difficulty swallowing pills, even though she is taking Zyprexa and Synthroid.  Patient appears disheveled and upset and feeling hopeless and not knowing what is going to happen to her.  Patient has been encouraged to continue trying to eat and drink.  She did report that her mouth was dry and I will order some Biotene mouthwash to assist with that.  I have also switched her Zyprexa to Zydis 10 mg sublingual nightly since the patient is reporting unable to swallow her pills at this time and would now want her to go without her Zyprexa tonight.  She does deny having any suicidal or homicidal ideations.  Treatment Plan Summary: Daily contact with patient to assess and evaluate symptoms and progress in treatment and Medication management Start Biotene 15 mL mouth rinse as needed for dry mouth Continue  Synthroid 88 mcg p.o. daily for hypothyroidism Start Zyprexa Zydis 10 mg sublingual nightly for bipolar 1 disorder Continue Protonix 40 mg p.o. daily for GERD Continue trazodone 50 mg p.o. nightly as needed for insomnia Encourage group therapy participation Continue every 15 minute safety checks We will request staff to continue monitoring  patient's intake  Lewis Shock, FNP 03/26/2020, 2:14 PM

## 2020-03-27 MED ORDER — LOPERAMIDE HCL 2 MG PO CAPS
2.0000 mg | ORAL_CAPSULE | ORAL | Status: DC | PRN
  Administered 2020-03-27 (×2): 2 mg via ORAL
  Filled 2020-03-27 (×2): qty 1

## 2020-03-27 MED ORDER — TRAZODONE HCL 100 MG PO TABS
100.0000 mg | ORAL_TABLET | Freq: Every day | ORAL | Status: DC
  Administered 2020-03-27 – 2020-04-02 (×6): 100 mg via ORAL
  Filled 2020-03-27 (×7): qty 1

## 2020-03-27 NOTE — [facility] Group Notes (Signed)
[facility] Group Notes:  (Nursing/MHT/Case Management/Adjunct)  Date:  03/27/2020  Time:  10:00 PM  Type of Therapy:  Group Therapy  Participation Level:  Active  Participation Quality:  Appropriate  Affect:  Appropriate  Cognitive:  Appropriate  Insight:  Good  Engagement in Group:  Engaged  Modes of Intervention:  Discussion, Education and Support  Summary of Progress/Problems: PT explained that she wants to get in control of her life and start taking her medication  Satcha Storlie Marcine Matar 03/27/2020, 10:00 PM

## 2020-03-27 NOTE — Plan of Care (Signed)
Patient steadily improving.  Problem: Education: Goal: Mental status will improve Outcome: Progressing Goal: Verbalization of understanding the information provided will improve Outcome: Progressing

## 2020-03-27 NOTE — Plan of Care (Signed)
Pt continues to improve on the unit   Problem: Education: Goal: Emotional status will improve Outcome: Progressing Goal: Mental status will improve Outcome: Progressing

## 2020-03-27 NOTE — Progress Notes (Signed)
Recreation Therapy Notes   Date: 03/27/2020  Time: 9:30 am  Location: Craft Room  Behavioral response: Appropriate   Intervention Topic: Anger management    Discussion/Intervention:  Group content on today was focused on anger management. The group defined anger and reasons they become angry. Individuals expressed negative way they have dealt with anger in the past. Patients stated some positive ways they could deal with anger in the future. The group described how anger can affect your health and daily plans. Individuals participated in the intervention "Blowing up anger" where they had a chance to answer questions about themselves and see how much energy they waste on being angry.  Clinical Observations/Feedback:  Patient came to group and stated that she gets angry when people come over uninvited. She explained her blood pressures rises when she get angry. Individual was social with peers and staff while participating in the intervention during group.  Glenmore Karl LRT/CTRS         Aalliyah Kilker 03/27/2020 11:28 AM

## 2020-03-27 NOTE — Progress Notes (Signed)
F - Decrease delusional thoughts, increase of food/fluids, and help manage anxiety.  D - The patient endorsed an improved mood and has been eating the majority of her meals, plus supplemental shakes.  Delusional thoughts pertaining to digestive issues and dehydration continue and have been impacting the patient's ability to function.  Leslie Wong was provided PRN Imodium at 15:30 due to reports of diarrhea.  The patient denied thoughts of harming herself and others.  Insight and judgment improved.  A - The patient has been responding to medications and appears to be progressing towards her baseline.  Encouragement and positive reinforcement provided.  Leslie Wong is more able to appropriately advocate for herself and remains safe on her feet.    R - Responding to treatment.  Continue as ordered.

## 2020-03-27 NOTE — Progress Notes (Signed)
[facility] MD Progress Note  03/27/2020 3:22 PM Leslie Wong  MRN:  [id number] Subjective: Follow-up for this patient with bipolar disorder and several medical problems.  Patient seen chart reviewed.  Patient continues to spend most of her time in bed.  When I came to see her today she was awake but laying in bed in a dark room.  Still says that she has trouble drinking fluids but cannot really explain why.  She does ask me if she can have double portions and claims that she ate all of her meal with lunch.  Patient says her mood still feels bad.  Very passive suicidal thoughts nothing active.  A little bit of paranoia voiced and a little bit of confusion. Principal Problem: Bipolar 1 disorder ([place]) Diagnosis: Principal Problem:   Bipolar 1 disorder (H[facility]) Active Problems:   Hypothyroidism   Gastroesophageal reflux disease with hiatal hernia   Irritable bowel syndrome  Total Time spent with patient: 30 minutes  Past Psychiatric History: Patient has a history of bipolar disorder with multiple hospitalizations at times complicated by substance use  Past Medical History:  Past Medical History:  Diagnosis Date  . Back pain   . Cancer ([place])    skin  . Depression    BAD  . Gastroparesis   . GERD (gastroesophageal reflux disease)   . Hypothyroidism   . Insomnia   . Migraines   . Parkinson disease ([place])    per [place], dx'd 2019  . Recurrent cold sores   . Shoulder bursitis   . Urge incontinence     Past Surgical History:  Procedure Laterality Date  . ABDOMINAL HYSTERECTOMY  1999   total  . APPENDECTOMY    . ESOPHAGOGASTRODUODENOSCOPY  01/2006   negative except small hiatal hernia  . ESOPHAGOGASTRODUODENOSCOPY  02/24/2015   See report  . LAPAROSCOPY     for endometriosis  . OVARIAN CYST REMOVAL  1990   unilateral  . TONSILLECTOMY AND ADENOIDECTOMY     Family History:  Family History  Problem Relation Age of Onset  . Hypertension Mother   . Arthritis Mother   .  Diabetes Mother   . Stroke Mother   . Cancer Father        lung  . Colon cancer Neg Hx   . Breast cancer Neg Hx    Family Psychiatric  History: See previous Social History:  Social History   Substance and Sexual Activity  Alcohol Use No  . Alcohol/week: 0.0 standard drinks     Social History   Substance and Sexual Activity  Drug Use No    Social History   Socioeconomic History  . Marital status: Divorced    Spouse name: Not on file  . Number of children: Not on file  . Years of education: Not on file  . Highest education level: Not on file  Occupational History  . Not on file  Tobacco Use  . Smoking status: Current Some Day Smoker    Packs/day: 1.00    Years: 42.50    Pack years: 42.50    Types: Cigarettes  . Smokeless tobacco: Former Systems developer  . Tobacco comment: declined  Substance and Sexual Activity  . Alcohol use: No    Alcohol/week: 0.0 standard drinks  . Drug use: No  . Sexual activity: Never  Other Topics Concern  . Not on file  Social History Narrative   Lives alone.     Has a dog names Dixie   Social Determinants  of Health   Financial Resource Strain:   . Difficulty of Paying Living Expenses:   Food Insecurity:   . Worried About Charity fundraiser in the Last Year:   . Arboriculturist in the Last Year:   Transportation Needs:   . Film/video editor (Medical):   Marland Kitchen Lack of Transportation (Non-Medical):   Physical Activity:   . Days of Exercise per Week:   . Minutes of Exercise per Session:   Stress:   . Feeling of Stress :   Social Connections:   . Frequency of Communication with Friends and Family:   . Frequency of Social Gatherings with Friends and Family:   . Attends Religious Services:   . Active Member of Clubs or Organizations:   . Attends Archivist Meetings:   Marland Kitchen Marital Status:    Additional Social History:                         Sleep: Poor  Appetite:  Fair  Current Medications: Current  Facility-Administered Medications  Medication Dose Route Frequency Provider Last Rate Last Admin  . acetaminophen (TYLENOL) tablet 650 mg  650 mg Oral Q6H PRN Caroline Sauger, NP      . alum & mag hydroxide-simeth (MAALOX/MYLANTA) 200-200-20 MG/5ML suspension 30 mL  30 mL Oral Q4H PRN Caroline Sauger, NP   30 mL at 03/27/20 [id number]  . antiseptic oral rinse (BIOTENE) solution 15 mL  15 mL Mouth Rinse PRN Money, Lowry Ram, FNP      . famotidine (PEPCID) tablet 20 mg  20 mg Oral BID Eudell Mcphee, [name], MD   20 mg at 03/27/20 0818  . feeding supplement (ENSURE ENLIVE) (ENSURE ENLIVE) liquid 237 mL  237 mL Oral TID BM Daichi Moris T, MD   237 mL at 03/27/20 1107  . hydrocerin (EUCERIN) cream   Topical BID Laci Frenkel, [name], MD   Given at 03/27/20 [phone]  . levothyroxine (SYNTHROID) tablet 88 mcg  88 mcg Oral Q0600 Jenaro Souder, [name], MD   88 mcg at 03/27/20 0612  . loperamide (IMODIUM) capsule 2 mg  2 mg Oral PRN Bessy Reaney T, MD      . magnesium hydroxide (MILK OF MAGNESIA) suspension 30 mL  30 mL Oral Daily PRN Caroline Sauger, NP      . multivitamin with minerals tablet 1 tablet  1 tablet Oral Daily Ellie Bryand, [name], MD   1 tablet at 03/27/20 0817  . OLANZapine zydis (ZYPREXA) disintegrating tablet 10 mg  10 mg Oral QHS Money, Lowry Ram, FNP   10 mg at 03/26/20 2130  . ondansetron (ZOFRAN-ODT) disintegrating tablet 4 mg  4 mg Oral Q8H PRN Devoiry Corriher T, MD      . oxyCODONE (Oxy IR/ROXICODONE) immediate release tablet 5 mg  5 mg Oral TID Ifeanyichukwu Wickham, [name], MD   5 mg at 03/27/20 1354  . pantoprazole (PROTONIX) EC tablet 40 mg  40 mg Oral Daily Windle Huebert, [name], MD   40 mg at 03/27/20 0817  . traZODone (DESYREL) tablet 100 mg  100 mg Oral QHS Emeli Goguen T, MD      . traZODone (DESYREL) tablet 50 mg  50 mg Oral QHS PRN Caroline Sauger, NP   50 mg at 03/26/20 2130    Lab Results: No results found for this or any previous visit (from the past 48 hour(s)).  Blood Alcohol level:  Lab Results   Component Value Date  ETH <10 03/24/2020   ETH <10 123XX123    Metabolic Disorder Labs: Lab Results  Component Value Date   HGBA1C 5.4 10/13/2018   MPG 108.28 10/13/2018   MPG 111.15 08/29/2018   No results found for: PROLACTIN Lab Results  Component Value Date   CHOL 182 01/03/2020   TRIG 278 (H) 01/03/2020   HDL 33 (L) 01/03/2020   CHOLHDL 5.5 01/03/2020   VLDL 56 (H) 01/03/2020   LDLCALC 93 01/03/2020   LDLCALC 103 (H) 10/13/2018    Physical Findings: AIMS:  , ,  ,  ,    CIWA:    COWS:     Musculoskeletal: Strength & Muscle Tone: decreased Gait & Station: normal Patient leans: N/A  Psychiatric Specialty Exam: Physical Exam  Nursing note and vitals reviewed. Constitutional: She appears well-developed and well-nourished.  HENT:  Head: Normocephalic and atraumatic.  Eyes: Pupils are equal, round, and reactive to light. Conjunctivae are normal.  Cardiovascular: Regular rhythm and normal heart sounds.  Respiratory: Effort normal. No respiratory distress.  GI: Soft.  Musculoskeletal:        General: Normal range of motion.     Cervical back: Normal range of motion.  Neurological: She is alert.  Skin: Skin is warm and dry.  Psychiatric: Judgment normal. Her mood appears anxious. Her affect is blunt. Her speech is delayed. She is slowed. Thought content is paranoid. Cognition and memory are impaired. She exhibits a depressed mood. She expresses no suicidal ideation.    Review of Systems  Constitutional: Positive for appetite change and unexpected weight change.  HENT: Negative.   Eyes: Negative.   Respiratory: Negative.   Cardiovascular: Negative.   Gastrointestinal: Negative.   Musculoskeletal: Negative.   Skin: Negative.   Neurological: Negative.   Psychiatric/Behavioral: Positive for confusion, dysphoric mood and sleep disturbance. The patient is nervous/anxious.     Blood pressure (!) 106/93, pulse 77, temperature 98.7 F (37.1 C), temperature  source Oral, resp. rate 17, height 4\' 11"  (1.499 m), weight 38.1 kg, SpO2 98 %.Body mass index is 16.97 kg/m.  General Appearance: Disheveled  Eye Contact:  Fair  Speech:  Slow  Volume:  Decreased  Mood:  Dysphoric  Affect:  Constricted  Thought Process:  Coherent  Orientation:  Full (Time, Place, and Person)  Thought Content:  Rumination and Tangential  Suicidal Thoughts:  Yes.  without intent/plan  Homicidal Thoughts:  No  Memory:  Immediate;   Fair Recent;   Fair Remote;   Fair  Judgement:  Impaired  Insight:  Shallow  Psychomotor Activity:  Decreased  Concentration:  Concentration: Poor  Recall:  [employer] of Knowledge:  Fair  Language:  Fair  Akathisia:  No  Handed:  Right  AIMS (if indicated):     Assets:  Desire for Improvement Housing Resilience Social Support  ADL's:  Impaired  Cognition:  Impaired,  Mild  Sleep:  Number of Hours: 8     Treatment Plan Summary: Daily contact with patient to assess and evaluate symptoms and progress in treatment, Medication management and Plan Patient with schizoaffective disorder chronic pain and increasingly what seems like just a chronic inability to really take care of herself.  She is not actively suicidal right now but does not appear to be in any better shape for taking care of herself.  I have suggested to her once again that she strongly consider moving into a group home as her daughter has been suggesting.  For now encourage patient to get  up and eat and I even requested double portions at her request.  She told me she wanted something for diarrhea and I have added Imodium and I have also restarted trazodone which she requested at night.  Encourage group attendance during the day.  Alethia Berthold, MD 03/27/2020, 3:22 PM

## 2020-03-27 NOTE — Progress Notes (Signed)
Patient was in her room upon arrival to the unit. Patient was pleasant during assessment denying SI/HI/AVH. Patient stated she was in pain 6/10 (see MAR). Pt continues to improve on the unit. Pt compliant with medication administration per MD orders. Patient given support and encouragement to be active in her treatment plan. Patient given education. Patient being monitored Q 15 minutes for safety per unit protocol, pt remains safe on the unit.

## 2020-03-27 NOTE — BHH Group Notes (Signed)
[facility] Group Notes:  (Nursing/MHT/Case Management/Adjunct)  Date:  03/27/2020  Time:  9:45 AM  Type of Therapy:  Community Meeting  Participation Level:  Did Not Attend  Summary of Progress/Problems:  Charna Busman 03/27/2020, 9:45 AM

## 2020-03-28 MED ORDER — HYDROXYZINE HCL 25 MG PO TABS: 25.0000 mg | ORAL_TABLET | Freq: Three times a day (TID) | ORAL | Status: DC | PRN

## 2020-03-28 NOTE — BHH Group Notes (Signed)
LCSW Group Therapy Note  03/28/2020 1:00pm  Type of Therapy and Topic:  Group Therapy:  Cognitive Distortions  Participation Level:  Minimal   Description of Group:    Patients in this group will be introduced to the topic of cognitive distortions.  Patients will identify and describe cognitive distortions, describe the feelings these distortions create for them.  Patients will identify one or more situations in their personal life where they have cognitively distorted thinking and will verbalize challenging this cognitive distortion through positive thinking skills.  Patients will practice the skill of using positive affirmations to challenge cognitive distortions using affirmation cards.    Therapeutic Goals:  1. Patient will identify two or more cognitive distortions they have used 2. Patient will identify one or more emotions that stem from use of a cognitive distortion 3. Patient will demonstrate use of a positive affirmation to counter a cognitive distortion through discussion and/or role play. 4. Patient will describe one way cognitive distortions can be detrimental to wellness   Summary of Patient Progress:  Pt identified current cognitive distortions she uses to process events in her life. The pt was able to identify a current issue, the automatic thought and challenge and process a healthier thought process regarding the identified issue.  Therapeutic Modalities:   Cognitive Behavioral Therapy Motivational Interviewing   Juanetta Beets, MSW, [facility] Clinical Social Worker  03/28/2020 5:05 PM

## 2020-03-28 NOTE — Plan of Care (Signed)
Patient remains anxious at times.  Problem: Education: Goal: Ability to state activities that reduce stress will improve Outcome: Progressing   Problem: Self-Concept: Goal: Ability to identify factors that promote anxiety will improve Outcome: Progressing

## 2020-03-28 NOTE — Progress Notes (Signed)
F - Effectively manage anxiety and decrease delusional thoughts.  D - Patient reported improved sleep.  Awake for breakfast and consumed the majority of meals.  A supplemental shake requested and drank as well.  Leslie Wong  denied thoughts of harming herself and did not endorse severe symptoms of psychosis.  However, she remains delusional pertaining to her health, dehydration, and possibly pain.  PSI-A revealed heightened anxiety/depression and chronic pain (feeling of being stung by hornets/muscles feeling like pulled rubberbands).  Leslie Wong isolated in her room, but occasionally attended groups and joined peers for meals.    A - Emotional support and encouragement provided.  Medication administered as ordered, but the patient continues to endorse delusional content.  Leslie Wong continues to struggle with high levels of anxiety.  Self care has improved.  Adequate food and fluids consumed.  15-minute safety checks maintained.  R - Steady improvements continue, yet delusions remain problematic.

## 2020-03-28 NOTE — BHH Group Notes (Signed)
[place] Group Notes:  (Nursing/MHT/Case Management/Adjunct)  Date:  03/28/2020  Time:  8:49 PM  Type of Therapy:  Group Therapy  Participation Level:  Did Not Attend  Leslie Wong 03/28/2020, 8:49 PM

## 2020-03-28 NOTE — BHH Group Notes (Signed)
[facility] Group Notes:  (Nursing/MHT/Case Management/Adjunct)  Date:  03/28/2020  Time:  5:33 PM  Type of Therapy:  Psychoeducational Skills  Participation Level:  Did Not Attend  Participation Quality:    Affect:    Cognitive:    Insight:    Engagement in Group:    Modes of Intervention:    Summary of Progress/Problems:  Leslie Wong 03/28/2020, 5:33 PM

## 2020-03-28 NOTE — Progress Notes (Signed)
[facility] MD Progress Note  03/28/2020 9:35 AM Janel Senatus  MRN:  [id number]  Ms Wagenblast is a 64yo F with schizoaffective disorder, who was admitted 3 days ago due to delusional thoughts and inability to tace care of herself.  Patient seen.  Chart reviewed. Patient discussed with nursing; no overnight events reported.  Subjective:   Patient continues to spend most time in her room in bed. She reports "I am still fighting" and complaints about generalized weakness and confusion. Reports she ate breakfast "it was good". Reports feeling still "down", although denies thoughts or harming self or others "no I am not suicidal. I've done enough damage to myself for last months." Denies side effects from medications.  Principal Problem: Bipolar 1 disorder ([place]) Diagnosis: Principal Problem:   Bipolar 1 disorder ([facility]) Active Problems:   Hypothyroidism   Gastroesophageal reflux disease with hiatal hernia   Irritable bowel syndrome  Total Time spent with patient: 15 minutes  Past Psychiatric History: see H&P  Past Medical History:  Past Medical History:  Diagnosis Date  . Back pain   . Cancer ([place])    skin  . Depression    BAD  . Gastroparesis   . GERD (gastroesophageal reflux disease)   . Hypothyroidism   . Insomnia   . Migraines   . Parkinson disease ([place])    per [place] [facility], dx'd 2019  . Recurrent cold sores   . Shoulder bursitis   . Urge incontinence     Past Surgical History:  Procedure Laterality Date  . ABDOMINAL HYSTERECTOMY  1999   total  . APPENDECTOMY    . ESOPHAGOGASTRODUODENOSCOPY  01/2006   negative except small hiatal hernia  . ESOPHAGOGASTRODUODENOSCOPY  02/24/2015   See report  . LAPAROSCOPY     for endometriosis  . OVARIAN CYST REMOVAL  1990   unilateral  . TONSILLECTOMY AND ADENOIDECTOMY     Family History:  Family History  Problem Relation Age of Onset  . Hypertension Mother   . Arthritis Mother   . Diabetes Mother   . Stroke Mother   .  Cancer Father        lung  . Colon cancer Neg Hx   . Breast cancer Neg Hx    Family Psychiatric  History: see H&P Social History:  Social History   Substance and Sexual Activity  Alcohol Use No  . Alcohol/week: 0.0 standard drinks     Social History   Substance and Sexual Activity  Drug Use No    Social History   Socioeconomic History  . Marital status: Divorced    Spouse name: Not on file  . Number of children: Not on file  . Years of education: Not on file  . Highest education level: Not on file  Occupational History  . Not on file  Tobacco Use  . Smoking status: Current Some Day Smoker    Packs/day: 1.00    Years: 42.50    Pack years: 42.50    Types: Cigarettes  . Smokeless tobacco: Former Systems developer  . Tobacco comment: declined  Substance and Sexual Activity  . Alcohol use: No    Alcohol/week: 0.0 standard drinks  . Drug use: No  . Sexual activity: Never  Other Topics Concern  . Not on file  Social History Narrative   Lives alone.     Has a dog names Dixie   Social Determinants of Health   Financial Resource Strain:   . Difficulty of Paying Living Expenses:  Food Insecurity:   . Worried About Charity fundraiser in the Last Year:   . Arboriculturist in the Last Year:   Transportation Needs:   . Film/video editor (Medical):   Marland Kitchen Lack of Transportation (Non-Medical):   Physical Activity:   . Days of Exercise per Week:   . Minutes of Exercise per Session:   Stress:   . Feeling of Stress :   Social Connections:   . Frequency of Communication with Friends and Family:   . Frequency of Social Gatherings with Friends and Family:   . Attends Religious Services:   . Active Member of Clubs or Organizations:   . Attends Archivist Meetings:   Marland Kitchen Marital Status:    Additional Social History:                         Sleep: Fair  Appetite:  Fair  Current Medications: Current Facility-Administered Medications  Medication Dose Route  Frequency Provider Last Rate Last Admin  . acetaminophen (TYLENOL) tablet 650 mg  650 mg Oral Q6H PRN Caroline Sauger, NP      . alum & mag hydroxide-simeth (MAALOX/MYLANTA) 200-200-20 MG/5ML suspension 30 mL  30 mL Oral Q4H PRN Caroline Sauger, NP   30 mL at 03/27/20 2112  . antiseptic oral rinse (BIOTENE) solution 15 mL  15 mL Mouth Rinse PRN Money, Lowry Ram, FNP      . famotidine (PEPCID) tablet 20 mg  20 mg Oral BID [name], [name], MD   20 mg at 03/28/20 0803  . feeding supplement (ENSURE ENLIVE) (ENSURE ENLIVE) liquid 237 mL  237 mL Oral TID BM [name], [name], MD   237 mL at 03/27/20 1107  . hydrocerin (EUCERIN) cream   Topical BID [name], [name], MD   Given at 03/28/20 [phone]  . levothyroxine (SYNTHROID) tablet 88 mcg  88 mcg Oral Q0600 [name], [name], MD   88 mcg at 03/28/20 [id number]  . loperamide (IMODIUM) capsule 2 mg  2 mg Oral PRN [name], [name], MD   2 mg at 03/27/20 2125  . magnesium hydroxide (MILK OF MAGNESIA) suspension 30 mL  30 mL Oral Daily PRN Caroline Sauger, NP      . multivitamin with minerals tablet 1 tablet  1 tablet Oral Daily [name], [name], MD   1 tablet at 03/28/20 0804  . OLANZapine zydis (ZYPREXA) disintegrating tablet 10 mg  10 mg Oral QHS Money, Lowry Ram, FNP   10 mg at 03/27/20 2112  . ondansetron (ZOFRAN-ODT) disintegrating tablet 4 mg  4 mg Oral Q8H PRN [name], [name], MD      . oxyCODONE (Oxy IR/ROXICODONE) immediate release tablet 5 mg  5 mg Oral TID [name], [name], MD   5 mg at 03/28/20 0804  . pantoprazole (PROTONIX) EC tablet 40 mg  40 mg Oral Daily [name], [name], MD   40 mg at 03/28/20 0804  . traZODone (DESYREL) tablet 100 mg  100 mg Oral QHS [name], [name], MD   100 mg at 03/27/20 2113  . traZODone (DESYREL) tablet 50 mg  50 mg Oral QHS PRN Caroline Sauger, NP   50 mg at 03/27/20 2113    Lab Results: No results found for this or any previous visit (from the past 48 hour(s)).  Blood Alcohol level:  Lab Results  Component Value  Date   ETH <10 03/24/2020   ETH <10 01/23/2020  Metabolic Disorder Labs: Lab Results  Component Value Date   HGBA1C 5.4 10/13/2018   MPG 108.28 10/13/2018   MPG 111.15 08/29/2018   No results found for: PROLACTIN Lab Results  Component Value Date   CHOL 182 01/03/2020   TRIG 278 (H) 01/03/2020   HDL 33 (L) 01/03/2020   CHOLHDL 5.5 01/03/2020   VLDL 56 (H) 01/03/2020   LDLCALC 93 01/03/2020   LDLCALC 103 (H) 10/13/2018    Physical Findings: AIMS:  , ,  ,  ,    CIWA:    COWS:     Musculoskeletal: Strength & Muscle Tone: decreased Gait & Station: normal Patient leans: N/A  Psychiatric Specialty Exam: Physical Exam  Review of Systems  Blood pressure 117/87, pulse 69, temperature 97.9 F (36.6 C), temperature source Oral, resp. rate 17, height 4\' 11"  (1.499 m), weight 38.1 kg, SpO2 99 %.Body mass index is 16.97 kg/m.  General Appearance: Disheveled  Eye Contact:  Fair  Speech:  Slow  Volume:  Decreased  Mood:  Dysphoric  Affect:  Constricted  Thought Process:  Coherent  Orientation:  Full (Time, Place, and Person)  Thought Content:  Rumination and Tangential  Suicidal Thoughts:  No  Homicidal Thoughts:  No  Memory:  Immediate;   Fair Recent;   Fair Remote;   Fair  Judgement:  Other:  questionable  Insight:  Shallow  Psychomotor Activity:  Decreased  Concentration:  Concentration: decreased and Attention Span: Fair  Recall:  [employer] of Knowledge:  Fair  Language:  Fair  Akathisia:  No  Handed:  Right  AIMS (if indicated):     Assets:  Desire for Improvement Housing Resilience Social Support  ADL's:  Impaired  Cognition:  Impaired,  Mild  Sleep:  Number of Hours: 8     Treatment Plan Summary: Daily contact with patient to assess and evaluate symptoms and progress in treatment   64yo F with schizoaffective disorder, who was admitted 3 days ago due to delusional thoughts and inability to tace care of herself. Patient reports slight mood  improvement and appetite improvement. She is not suicidal. She is better oriented for future and wants to improve. Encouraged to be more active during the day and participate in milieu activities.  Impression: Schizoaffective disorder.  Plan:  -continue inpatient psych admission; 15-minute checks; daily contact with patient to assess and evaluate symptoms and progress in treatment; psychoeducation.  -continue scheduled psych medications: Zyprexa 10mg  PO QHS  -continue Trazodone 100mg  QHS PRN sleep.  -Disposition: to be determined when patient is more stable.  Patient encourage to consider moving into a group home as her daughter has been suggesting.  [name], MD 03/28/2020, 9:35 AM

## 2020-03-29 MED ORDER — OLANZAPINE 5 MG PO TBDP
15.0000 mg | ORAL_TABLET | Freq: Every day | ORAL | Status: DC
  Administered 2020-03-29 – 2020-03-30 (×2): 15 mg via ORAL
  Filled 2020-03-29 (×2): qty 3

## 2020-03-29 NOTE — Plan of Care (Signed)
  Problem: Education: Goal: Emotional status will improve Outcome: Progressing Goal: Mental status will improve Outcome: Progressing Goal: Verbalization of understanding the information provided will improve Outcome: Progressing   Problem: Education: Goal: Ability to state activities that reduce stress will improve 03/29/2020 0314 by Butler-Nicholson, Banner Huckaba L, LPN Outcome: Progressing 03/29/2020 0313 by Butler-Nicholson, Guilianna Mckoy L, LPN Outcome: Progressing   Problem: Self-Concept: Goal: Ability to identify factors that promote anxiety will improve Outcome: Progressing Goal: Level of anxiety will decrease 03/29/2020 0314 by Butler-Nicholson, Shalayah Beagley L, LPN Outcome: Progressing 03/29/2020 0313 by Butler-Nicholson, Kimie Pidcock L, LPN Outcome: Progressing Goal: Ability to modify response to factors that promote anxiety will improve 03/29/2020 0314 by Butler-Nicholson, Lolitha Tortora L, LPN Outcome: Progressing 03/29/2020 0313 by Butler-Nicholson, Donabelle Molden L, LPN Outcome: Progressing

## 2020-03-29 NOTE — Progress Notes (Signed)
Patient alert and oriented x4. Pt. Denied SI/HI/AVH. Patient received ordered medication as well as Tylenol for pain. Pt.  rated pain back pain at 8 on 0-10 scale.  Pt. tolerated medications without incident. Patient pleasant and easy to engage. Participates in group and meals with peers. Patient informed to contact staff with questions or concerns.  Patient is safe at this time with 15 minute safety checks.

## 2020-03-29 NOTE — Progress Notes (Signed)
[facility] MD Progress Note  03/29/2020 9:21 AM Campbell Deale  MRN:  [id number]  Ms Leslie Wong is a 64yo F with schizoaffective disorder, who was admitted 4 days ago due to delusional thoughts and inability to take care of herself.  Patient seen.  Chart reviewed. Patient discussed with nursing; no overnight events reported.  Subjective:   Patient appears to be more active today and was observed in community areas. She reports feeling "very depressed" due to "not taking care of myself for long time". She continues to complain about "feeling very dry insight and out", "I am like a rubber band". She reports she wants to get better, understands the process takes time. She denies thoughts of harming self "I've done enough self-harm". She says "I am trying to drink more and to pul lotion on my skin after shower to keep water insight my body". She reports Hydroxyzine helps with skin itching. Denies side effects from medications.  Principal Problem: Bipolar 1 disorder ([place]) Diagnosis: Principal Problem:   Bipolar 1 disorder ([facility]) Active Problems:   Hypothyroidism   Gastroesophageal reflux disease with hiatal hernia   Irritable bowel syndrome  Total Time spent with patient: 15 minutes  Past Psychiatric History: see H&P  Past Medical History:  Past Medical History:  Diagnosis Date  . Back pain   . Cancer ([place])    skin  . Depression    BAD  . Gastroparesis   . GERD (gastroesophageal reflux disease)   . Hypothyroidism   . Insomnia   . Migraines   . Parkinson disease ([place])    per [place] [facility], dx'd 2019  . Recurrent cold sores   . Shoulder bursitis   . Urge incontinence     Past Surgical History:  Procedure Laterality Date  . ABDOMINAL HYSTERECTOMY  1999   total  . APPENDECTOMY    . ESOPHAGOGASTRODUODENOSCOPY  01/2006   negative except small hiatal hernia  . ESOPHAGOGASTRODUODENOSCOPY  02/24/2015   See report  . LAPAROSCOPY     for endometriosis  . OVARIAN CYST REMOVAL  1990    unilateral  . TONSILLECTOMY AND ADENOIDECTOMY     Family History:  Family History  Problem Relation Age of Onset  . Hypertension Mother   . Arthritis Mother   . Diabetes Mother   . Stroke Mother   . Cancer Father        lung  . Colon cancer Neg Hx   . Breast cancer Neg Hx    Family Psychiatric  History: see H&P Social History:  Social History   Substance and Sexual Activity  Alcohol Use No  . Alcohol/week: 0.0 standard drinks     Social History   Substance and Sexual Activity  Drug Use No    Social History   Socioeconomic History  . Marital status: Divorced    Spouse name: Not on file  . Number of children: Not on file  . Years of education: Not on file  . Highest education level: Not on file  Occupational History  . Not on file  Tobacco Use  . Smoking status: Current Some Day Smoker    Packs/day: 1.00    Years: 42.50    Pack years: 42.50    Types: Cigarettes  . Smokeless tobacco: Former Systems developer  . Tobacco comment: declined  Substance and Sexual Activity  . Alcohol use: No    Alcohol/week: 0.0 standard drinks  . Drug use: No  . Sexual activity: Never  Other Topics Concern  . Not  on file  Social History Narrative   Lives alone.     Has a dog names Dixie   Social Determinants of Health   Financial Resource Strain:   . Difficulty of Paying Living Expenses:   Food Insecurity:   . Worried About Charity fundraiser in the Last Year:   . Arboriculturist in the Last Year:   Transportation Needs:   . Film/video editor (Medical):   Marland Kitchen Lack of Transportation (Non-Medical):   Physical Activity:   . Days of Exercise per Week:   . Minutes of Exercise per Session:   Stress:   . Feeling of Stress :   Social Connections:   . Frequency of Communication with Friends and Family:   . Frequency of Social Gatherings with Friends and Family:   . Attends Religious Services:   . Active Member of Clubs or Organizations:   . Attends Archivist Meetings:    Marland Kitchen Marital Status:    Additional Social History:                         Sleep: Fair  Appetite:  Fair  Current Medications: Current Facility-Administered Medications  Medication Dose Route Frequency Provider Last Rate Last Admin  . acetaminophen (TYLENOL) tablet 650 mg  650 mg Oral Q6H PRN Caroline Sauger, NP   650 mg at 03/28/20 2120  . alum & mag hydroxide-simeth (MAALOX/MYLANTA) 200-200-20 MG/5ML suspension 30 mL  30 mL Oral Q4H PRN Caroline Sauger, NP   30 mL at 03/27/20 2112  . antiseptic oral rinse (BIOTENE) solution 15 mL  15 mL Mouth Rinse PRN Money, Lowry Ram, FNP      . famotidine (PEPCID) tablet 20 mg  20 mg Oral BID [name], [name], MD   20 mg at 03/29/20 0840  . feeding supplement (ENSURE ENLIVE) (ENSURE ENLIVE) liquid 237 mL  237 mL Oral TID BM [name], [name], MD   237 mL at 03/29/20 0841  . hydrocerin (EUCERIN) cream   Topical BID [name], [name], MD   Given at 03/29/20 0840  . hydrOXYzine (ATARAX/VISTARIL) tablet 25 mg  25 mg Oral TID PRN [name], MD      . levothyroxine (SYNTHROID) tablet 88 mcg  88 mcg Oral Q0600 [name], [name], MD   88 mcg at 03/28/20 [id number]  . loperamide (IMODIUM) capsule 2 mg  2 mg Oral PRN [name], [name], MD   2 mg at 03/27/20 2125  . magnesium hydroxide (MILK OF MAGNESIA) suspension 30 mL  30 mL Oral Daily PRN Caroline Sauger, NP      . multivitamin with minerals tablet 1 tablet  1 tablet Oral Daily [name], [name], MD   1 tablet at 03/29/20 0840  . OLANZapine zydis (ZYPREXA) disintegrating tablet 10 mg  10 mg Oral QHS Money, Lowry Ram, FNP   10 mg at 03/28/20 2120  . ondansetron (ZOFRAN-ODT) disintegrating tablet 4 mg  4 mg Oral Q8H PRN [name], [name], MD      . oxyCODONE (Oxy IR/ROXICODONE) immediate release tablet 5 mg  5 mg Oral TID [name], [name], MD   5 mg at 03/29/20 0840  . pantoprazole (PROTONIX) EC tablet 40 mg  40 mg Oral Daily [name], [name], MD   40 mg at 03/29/20 0840  . traZODone (DESYREL) tablet 100 mg   100 mg Oral QHS [name], [name], MD   100 mg at 03/27/20 2113  .  traZODone (DESYREL) tablet 50 mg  50 mg Oral QHS PRN Caroline Sauger, NP   50 mg at 03/28/20 2120    Lab Results: No results found for this or any previous visit (from the past 48 hour(s)).  Blood Alcohol level:  Lab Results  Component Value Date   ETH <10 03/24/2020   ETH <10 123XX123    Metabolic Disorder Labs: Lab Results  Component Value Date   HGBA1C 5.4 10/13/2018   MPG 108.28 10/13/2018   MPG 111.15 08/29/2018   No results found for: PROLACTIN Lab Results  Component Value Date   CHOL 182 01/03/2020   TRIG 278 (H) 01/03/2020   HDL 33 (L) 01/03/2020   CHOLHDL 5.5 01/03/2020   VLDL 56 (H) 01/03/2020   LDLCALC 93 01/03/2020   LDLCALC 103 (H) 10/13/2018    Physical Findings: AIMS:  , ,  ,  ,    CIWA:    COWS:     Musculoskeletal: Strength & Muscle Tone: decreased Gait & Station: normal Patient leans: N/A  Psychiatric Specialty Exam: Physical Exam   Review of Systems   Blood pressure 97/62, pulse 91, temperature 98 F (36.7 C), temperature source Oral, resp. rate 18, height 4\' 11"  (1.499 m), weight 38.1 kg, SpO2 97 %.Body mass index is 16.97 kg/m.  General Appearance: Disheveled  Eye Contact:  Fair  Speech:  Slow  Volume:  Decreased  Mood:  Dysphoric  Affect:  Constricted  Thought Process:  Coherent  Orientation:  Full (Time, Place, and Person)  Thought Content:  Rumination and Tangential  Suicidal Thoughts:  No  Homicidal Thoughts:  No  Memory:  Immediate;   Fair Recent;   Fair Remote;   Fair  Judgement:  Other:  questionable  Insight:  Shallow  Psychomotor Activity:  Decreased  Concentration:  Concentration: decreased and Attention Span: Fair  Recall:  [employer] of Knowledge:  Fair  Language:  Fair  Akathisia:  No  Handed:  Right  AIMS (if indicated):     Assets:  Desire for Improvement Housing Resilience Social Support  ADL's:  Impaired  Cognition:  Impaired,   Mild  Sleep:  Number of Hours: 7.45     Treatment Plan Summary: Daily contact with patient to assess and evaluate symptoms and progress in treatment   64yo F with schizoaffective disorder, who was admitted due to delusional thoughts and inability to tace care of herself. Patient continues to report slight mood improvement, although she remains delusional about her body. Will increase the dose of antipsychotic.  Impression: Schizoaffective disorder.  Plan:  -continue inpatient psych admission; 15-minute checks; daily contact with patient to assess and evaluate symptoms and progress in treatment; psychoeducation.  -increase Zyprexa to 15mg  PO QHS for mood and psychosis;  -continue Trazodone 100mg  QHS PRN sleep and Hydroxyzine 25mg  PO TID PRN anxiety.  -Disposition: to be determined when patient is more stable.  Patient encourage to consider moving into a group home as her daughter has been suggesting.  [name], MD 03/29/2020, 9:21 AM

## 2020-03-29 NOTE — Progress Notes (Signed)
Patient isolative to self and room. Denies SI, HI, AVH. Continues to endorse depression and Isolative to room except for meds and meals. Pt states did not feel like eating or drinking much today. Writer encouraged patient to eat and drink as much as she can.  Encouragement and support provided. Safety checks maintained. Medications given as prescribed. Pt receptive and remains safe on unit with q 15 min checks.

## 2020-03-29 NOTE — [facility] Group Notes (Signed)
03/29/2020 1:00pm   Type of Therapy and Topic:  Group Therapy:  Change and Accountability  Participation Level:  Did Not Attend  Description of Group In this group, patients discussed power and accountability for change.  The group identified the challenges related to accountability and the difficulty of accepting the outcomes of negative behaviors.  Patients were encouraged to openly discuss a challenge/change they could take responsibility for.  Patients discussed the use of "change talk" and positive thinking as ways to support achievement of personal goals.  The group discussed ways to give support and empowerment to peers.  Therapeutic Goals: 1. Patients will state the relationship between personal power and accountability in the change process 2. Patients will identify the positive and negative consequences of a personal choice they have made 3. Patients will identify one challenge/choice they will take responsibility for making 4. Patients will discuss the role of "change talk" and the impact of positive thinking as it supports successful personal change 5. Patients will verbalize support and affirmation of change efforts in peers  Summary of Patient Progress:  X  Therapeutic Modalities Solution Focused Brief Therapy Motivational Interviewing Cognitive Behavioral Therapy    Juanetta Beets, MSW, [facility] Clinical Social Worker  03/29/2020 3:40 PM

## 2020-03-29 NOTE — Plan of Care (Signed)
  Problem: Education: Goal: Emotional status will improve Outcome: Not Progressing Goal: Mental status will improve Outcome: Progressing Goal: Verbalization of understanding the information provided will improve Outcome: Progressing   Problem: Safety: Goal: Periods of time without injury will increase Outcome: Progressing   Problem: Education: Goal: Ability to state activities that reduce stress will improve Outcome: Progressing   Problem: Self-Concept: Goal: Level of anxiety will decrease Outcome: Not Progressing

## 2020-03-30 NOTE — Progress Notes (Signed)
Patient alert and oriented x 4, thoughts are organized and coherent, she denies SI/HI/AVH, affect is flat and sad , she isolated to her room most of the shift, minimal interaction with peers and staff. Patient rated depression  6/10 ( 0 LOW - HIGH 10). Patient was complaint with medication regimen and she eat evening snack. No distress noted 15, minutes safety checks maintained, will continue to monitor.

## 2020-03-30 NOTE — [facility] Group Notes (Signed)
Overcoming Obstacles  03/30/2020 1PM  Type of Therapy and Topic:  Group Therapy:  Overcoming Obstacles  Participation Level:  None    Description of Group:    In this group patients will be encouraged to explore what they see as obstacles to their own wellness and recovery. They will be guided to discuss their thoughts, feelings, and behaviors related to these obstacles. The group will process together ways to cope with barriers, with attention given to specific choices patients can make. Each patient will be challenged to identify changes they are motivated to make in order to overcome their obstacles. This group will be process-oriented, with patients participating in exploration of their own experiences as well as giving and receiving support and challenge from other group members.   Therapeutic Goals: 1. Patient will identify personal and current obstacles as they relate to admission. 2. Patient will identify barriers that currently interfere with their wellness or overcoming obstacles.  3. Patient will identify feelings, thought process and behaviors related to these barriers. 4. Patient will identify two changes they are willing to make to overcome these obstacles:      Summary of Patient Progress Pt attended group session but did not participate in group activity. Pt sat quietly, no interaction with group members.    Therapeutic Modalities:   Cognitive Behavioral Therapy Solution Focused Therapy Motivational Interviewing Relapse Prevention Therapy    Sanjuana Kava, MSW, LCSW 03/30/2020 1:58 PM

## 2020-03-30 NOTE — Progress Notes (Signed)
Recreation Therapy Notes    Date: 03/30/2020  Time: 9:30 am   Location: Craft room   Behavioral response: N/A   Intervention Topic: Happiness   Discussion/Intervention: Patient did not attend group.   Clinical Observations/Feedback:  Patient did not attend group.   Skilynn Durney LRT/CTRS         Raybon Conard 03/30/2020 11:52 AM

## 2020-03-30 NOTE — Progress Notes (Signed)
[facility] MD Progress Note  03/30/2020 4:46 PM Leslie Wong  MRN:  [id number] Subjective: Patient seen chart reviewed.  64 year old woman with a history of bipolar disorder who continues to present as very depressed.  She stays in bed most of the time.  Still not eating very much and not drinking much.  When asked about it she no longer blames her belching but just says that she does not know why she cannot eat anymore.  She rarely attends groups and stays isolated.  Affect is nervous and withdrawn.  Denies acute suicidal intent but seems to have little motivation to do much.  Her affect does seem a little more engaged than it was last time we spoke and her hygiene seems slightly improved.  Medicine continues to be largely the olanzapine along with the medicines for her medical issues such as her Synthroid. Principal Problem: Bipolar 1 disorder ([place]) Diagnosis: Principal Problem:   Bipolar 1 disorder ([facility]) Active Problems:   Hypothyroidism   Gastroesophageal reflux disease with hiatal hernia   Irritable bowel syndrome  Total Time spent with patient: 30 minutes  Past Psychiatric History: Patient has a long history of chronic mental illness multiple hospitalizations often related to substance overuse as well as bipolar mood symptoms  Past Medical History:  Past Medical History:  Diagnosis Date  . Back pain   . Cancer ([place])    skin  . Depression    BAD  . Gastroparesis   . GERD (gastroesophageal reflux disease)   . Hypothyroidism   . Insomnia   . Migraines   . Parkinson disease ([place])    per [place] [facility], dx'd 2019  . Recurrent cold sores   . Shoulder bursitis   . Urge incontinence     Past Surgical History:  Procedure Laterality Date  . ABDOMINAL HYSTERECTOMY  1999   total  . APPENDECTOMY    . ESOPHAGOGASTRODUODENOSCOPY  01/2006   negative except small hiatal hernia  . ESOPHAGOGASTRODUODENOSCOPY  02/24/2015   See report  . LAPAROSCOPY     for endometriosis  . OVARIAN  CYST REMOVAL  1990   unilateral  . TONSILLECTOMY AND ADENOIDECTOMY     Family History:  Family History  Problem Relation Age of Onset  . Hypertension Mother   . Arthritis Mother   . Diabetes Mother   . Stroke Mother   . Cancer Father        lung  . Colon cancer Neg Hx   . Breast cancer Neg Hx    Family Psychiatric  History: See previous Social History:  Social History   Substance and Sexual Activity  Alcohol Use No  . Alcohol/week: 0.0 standard drinks     Social History   Substance and Sexual Activity  Drug Use No    Social History   Socioeconomic History  . Marital status: Divorced    Spouse name: Not on file  . Number of children: Not on file  . Years of education: Not on file  . Highest education level: Not on file  Occupational History  . Not on file  Tobacco Use  . Smoking status: Current Some Day Smoker    Packs/day: 1.00    Years: 42.50    Pack years: 42.50    Types: Cigarettes  . Smokeless tobacco: Former Systems developer  . Tobacco comment: declined  Substance and Sexual Activity  . Alcohol use: No    Alcohol/week: 0.0 standard drinks  . Drug use: No  . Sexual activity: Never  Other Topics Concern  . Not on file  Social History Narrative   Lives alone.     Has a dog names Dixie   Social Determinants of Health   Financial Resource Strain:   . Difficulty of Paying Living Expenses:   Food Insecurity:   . Worried About Charity fundraiser in the Last Year:   . Arboriculturist in the Last Year:   Transportation Needs:   . Film/video editor (Medical):   Marland Kitchen Lack of Transportation (Non-Medical):   Physical Activity:   . Days of Exercise per Week:   . Minutes of Exercise per Session:   Stress:   . Feeling of Stress :   Social Connections:   . Frequency of Communication with Friends and Family:   . Frequency of Social Gatherings with Friends and Family:   . Attends Religious Services:   . Active Member of Clubs or Organizations:   . Attends Theatre manager Meetings:   Marland Kitchen Marital Status:    Additional Social History:                         Sleep: Fair  Appetite:  Negative  Current Medications: Current Facility-Administered Medications  Medication Dose Route Frequency Provider Last Rate Last Admin  . acetaminophen (TYLENOL) tablet 650 mg  650 mg Oral Q6H PRN Caroline Sauger, NP   650 mg at 03/28/20 2120  . alum & mag hydroxide-simeth (MAALOX/MYLANTA) 200-200-20 MG/5ML suspension 30 mL  30 mL Oral Q4H PRN Caroline Sauger, NP   30 mL at 03/27/20 2112  . antiseptic oral rinse (BIOTENE) solution 15 mL  15 mL Mouth Rinse PRN Money, Lowry Ram, FNP      . famotidine (PEPCID) tablet 20 mg  20 mg Oral BID Chonita Gadea, [name], MD   20 mg at 03/30/20 0804  . feeding supplement (ENSURE ENLIVE) (ENSURE ENLIVE) liquid 237 mL  237 mL Oral TID BM Geneen Dieter T, MD   237 mL at 03/30/20 1042  . hydrocerin (EUCERIN) cream   Topical BID Kagen Kunath, [name], MD   Given at 03/30/20 [phone]  . hydrOXYzine (ATARAX/VISTARIL) tablet 25 mg  25 mg Oral TID PRN [name], MD      . levothyroxine (SYNTHROID) tablet 88 mcg  88 mcg Oral Q0600 Brandonn Capelli, [name], MD   88 mcg at 03/30/20 0615  . loperamide (IMODIUM) capsule 2 mg  2 mg Oral PRN Shabreka Coulon, [name], MD   2 mg at 03/27/20 2125  . magnesium hydroxide (MILK OF MAGNESIA) suspension 30 mL  30 mL Oral Daily PRN Caroline Sauger, NP      . multivitamin with minerals tablet 1 tablet  1 tablet Oral Daily Tymar Polyak, [name], MD   1 tablet at 03/30/20 0804  . OLANZapine zydis (ZYPREXA) disintegrating tablet 15 mg  15 mg Oral QHS [name], MD   15 mg at 03/29/20 2119  . ondansetron (ZOFRAN-ODT) disintegrating tablet 4 mg  4 mg Oral Q8H PRN Shawnice Tilmon T, MD      . oxyCODONE (Oxy IR/ROXICODONE) immediate release tablet 5 mg  5 mg Oral TID Nil Xiong, [name], MD   5 mg at 03/30/20 1153  . pantoprazole (PROTONIX) EC tablet 40 mg  40 mg Oral Daily Ramadan Couey, [name], MD   40 mg at 03/30/20 0804  . traZODone  (DESYREL) tablet 100 mg  100 mg Oral QHS Cabella Kimm, [name], MD   100 mg  at 03/29/20 2119  . traZODone (DESYREL) tablet 50 mg  50 mg Oral QHS PRN Caroline Sauger, NP   50 mg at 03/28/20 2120    Lab Results: No results found for this or any previous visit (from the past 48 hour(s)).  Blood Alcohol level:  Lab Results  Component Value Date   ETH <10 03/24/2020   ETH <10 123XX123    Metabolic Disorder Labs: Lab Results  Component Value Date   HGBA1C 5.4 10/13/2018   MPG 108.28 10/13/2018   MPG 111.15 08/29/2018   No results found for: PROLACTIN Lab Results  Component Value Date   CHOL 182 01/03/2020   TRIG 278 (H) 01/03/2020   HDL 33 (L) 01/03/2020   CHOLHDL 5.5 01/03/2020   VLDL 56 (H) 01/03/2020   LDLCALC 93 01/03/2020   LDLCALC 103 (H) 10/13/2018    Physical Findings: AIMS:  , ,  ,  ,    CIWA:    COWS:     Musculoskeletal: Strength & Muscle Tone: decreased Gait & Station: unsteady Patient leans: N/A  Psychiatric Specialty Exam: Physical Exam  Nursing note and vitals reviewed. Constitutional: She appears well-developed and well-nourished.  HENT:  Head: Normocephalic and atraumatic.  Eyes: Pupils are equal, round, and reactive to light. Conjunctivae are normal.  Cardiovascular: Regular rhythm and normal heart sounds.  Respiratory: Effort normal. No respiratory distress.  GI: Soft.  Musculoskeletal:        General: Normal range of motion.     Cervical back: Normal range of motion.  Neurological: She is alert.  Skin: Skin is warm and dry.  Psychiatric: Her affect is blunt. Her speech is delayed. She is slowed and withdrawn. Thought content is not paranoid. Cognition and memory are impaired. She expresses inappropriate judgment. She exhibits a depressed mood. She expresses no homicidal and no suicidal ideation.    Review of Systems  Constitutional: Positive for appetite change.  HENT: Negative.   Eyes: Negative.   Respiratory: Negative.   Cardiovascular:  Negative.   Gastrointestinal: Negative.   Musculoskeletal: Negative.   Skin: Negative.   Neurological: Negative.   Psychiatric/Behavioral: Positive for dysphoric mood. The patient is nervous/anxious.     Blood pressure (!) 144/96, pulse 82, temperature 97.9 F (36.6 C), temperature source Oral, resp. rate 18, height 4\' 11"  (1.499 m), weight 38.1 kg, SpO2 100 %.Body mass index is 16.97 kg/m.  General Appearance: Casual  Eye Contact:  Fair  Speech:  Slow  Volume:  Decreased  Mood:  Depressed and Dysphoric  Affect:  Congruent  Thought Process:  Coherent  Orientation:  Full (Time, Place, and Person)  Thought Content:  Illogical and Rumination  Suicidal Thoughts:  No  Homicidal Thoughts:  No  Memory:  Immediate;   Fair Recent;   Fair Remote;   Fair  Judgement:  Impaired  Insight:  Shallow  Psychomotor Activity:  Decreased  Concentration:  Concentration: Poor  Recall:  [employer] of Knowledge:  Fair  Language:  Fair  Akathisia:  No  Handed:  Right  AIMS (if indicated):     Assets:  Desire for Improvement Housing Social Support  ADL's:  Impaired  Cognition:  Impaired,  Mild  Sleep:  Number of Hours: 8     Treatment Plan Summary: Daily contact with patient to assess and evaluate symptoms and progress in treatment, Medication management and Plan Possibly some slight improvement but very little from before the weekend.  Still very withdrawn not eating or drinking not in very  good health.  Spoke with her trying to elicit reasons for this so that we could come up with strategies.  Patient was not able to come up with very much.  Encouraged her to please make an effort to try to attend groups.  Reviewing her medicine I do not see right now necessarily a specific need for increase or change in medicine.  I do think she might still be a candidate for electroconvulsive therapy and told her so and we will readdress this tomorrow.  Alethia Berthold, MD 03/30/2020, 4:46 PM

## 2020-03-31 MED ORDER — OLANZAPINE 10 MG PO TBDP
20.0000 mg | ORAL_TABLET | Freq: Every day | ORAL | Status: DC
  Administered 2020-03-31 – 2020-04-02 (×3): 20 mg via ORAL
  Filled 2020-03-31 (×2): qty 2
  Filled 2020-03-31 (×2): qty 4
  Filled 2020-03-31 (×2): qty 2

## 2020-03-31 NOTE — BHH Group Notes (Signed)

## 2020-03-31 NOTE — Plan of Care (Signed)
  Problem: Education: Goal: Emotional status will improve Outcome: Progressing Goal: Mental status will improve Outcome: Progressing   Problem: Self-Concept: Goal: Level of anxiety will decrease Outcome: Progressing

## 2020-03-31 NOTE — Progress Notes (Signed)
Patient quiet and withdrawn, isolating in room. Alert and oriented x4. Patient denies SI/HI/AVH. Patient will engage when approached. Patient continues with complaints with difficulty swallowing and digesting food. Patient reported pain level at 8 on a 0-10 scale. Patient received ordered medication and tolerated without incident. Patient informed to contact staff with any concerns.  Patient remains safe with 15 minute safety checks.

## 2020-03-31 NOTE — Progress Notes (Signed)
Patient alert and oriented x 4, thoughts are organized and coherent, she denies SI/HI/AVH, affect is flat but brightens upon approach, she appears less anxious, she was Interacting with peers and staff appropriately Patient rated depression  5/10 ( 0 low - high10). Patient was complaint with medication regimen and she eat evening snack. No distress noted 15, minutes safety checks maintained, will continue to monitor.

## 2020-03-31 NOTE — [facility] Group Notes (Signed)
[facility] Group Notes:  (Nursing/MHT/Case Management/Adjunct)  Date:  03/31/2020  Time:  1:37 PM  Type of Therapy:  Psychoeducational Skills  Participation Level:  Did Not Attend    Summary of Progress/Problems:  Charna Busman 03/31/2020, 1:37 PM

## 2020-03-31 NOTE — Tx Team (Signed)
Interdisciplinary Treatment and Diagnostic Plan Update  03/31/2020 Time of Session: 8:30 Leslie Wong MRN: [id number]  Principal Diagnosis: Bipolar 1 disorder (Leslie Wong)  Secondary Diagnoses: Principal Problem:   Bipolar 1 disorder ([name]) Active Problems:   Hypothyroidism   Gastroesophageal reflux disease with hiatal hernia   Irritable bowel syndrome   Current Medications:  Current Facility-Administered Medications  Medication Dose Route Frequency Provider Last Rate Last Admin  . acetaminophen (TYLENOL) tablet 650 mg  650 mg Oral Q6H PRN Caroline Sauger, NP   650 mg at 03/28/20 2120  . alum & mag hydroxide-simeth (MAALOX/MYLANTA) 200-200-20 MG/5ML suspension 30 mL  30 mL Oral Q4H PRN Caroline Sauger, NP   30 mL at 03/27/20 2112  . antiseptic oral rinse (BIOTENE) solution 15 mL  15 mL Mouth Rinse PRN Money, Lowry Ram, FNP      . famotidine (PEPCID) tablet 20 mg  20 mg Oral BID [name], [name], MD   20 mg at 03/31/20 0803  . feeding supplement (ENSURE ENLIVE) (ENSURE ENLIVE) liquid 237 mL  237 mL Oral TID BM [name], [name], MD   237 mL at 03/31/20 1015  . hydrocerin (EUCERIN) cream   Topical BID [name], [name], MD   Given at 03/31/20 0805  . hydrOXYzine (ATARAX/VISTARIL) tablet 25 mg  25 mg Oral TID PRN [name], MD      . levothyroxine (SYNTHROID) tablet 88 mcg  88 mcg Oral Q0600 [name], [name], MD   88 mcg at 03/31/20 0616  . loperamide (IMODIUM) capsule 2 mg  2 mg Oral PRN [name], [name], MD   2 mg at 03/27/20 2125  . magnesium hydroxide (MILK OF MAGNESIA) suspension 30 mL  30 mL Oral Daily PRN Caroline Sauger, NP      . multivitamin with minerals tablet 1 tablet  1 tablet Oral Daily [name], [name], MD   1 tablet at 03/31/20 0804  . OLANZapine zydis (ZYPREXA) disintegrating tablet 20 mg  20 mg Oral QHS [name], [name], MD      . ondansetron (ZOFRAN-ODT) disintegrating tablet 4 mg  4 mg Oral Q8H PRN [name], [name], MD      . oxyCODONE (Oxy IR/ROXICODONE)  immediate release tablet 5 mg  5 mg Oral TID [name], [name], MD   5 mg at 03/31/20 1213  . pantoprazole (PROTONIX) EC tablet 40 mg  40 mg Oral Daily [name], [name], MD   40 mg at 03/31/20 0804  . traZODone (DESYREL) tablet 100 mg  100 mg Oral QHS [name], [name], MD   100 mg at 03/30/20 2127  . traZODone (DESYREL) tablet 50 mg  50 mg Oral QHS PRN Caroline Sauger, NP   50 mg at 03/28/20 2120   PTA Medications: Medications Prior to Admission  Medication Sig Dispense Refill Last Dose  . albuterol (VENTOLIN HFA) 108 (90 Base) MCG/ACT inhaler Inhale 2 puffs into the lungs every 6 (six) hours as needed for wheezing or shortness of breath. 18 g 11   . ascorbic acid (VITAMIN C) 500 MG tablet Take 1 tablet (500 mg total) by mouth daily. 30 tablet 1   . buPROPion (WELLBUTRIN) 75 MG tablet Take 1 tablet (75 mg total) by mouth daily. 30 tablet 1   . cholecalciferol (VITAMIN D) 25 MCG (1000 UNIT) tablet Take 1 tablet (1,000 Units total) by mouth daily. 30 tablet 1   . famotidine (PEPCID) 20 MG tablet Take 1 tablet (20 mg total) by mouth 2 (two) times daily.  60 tablet 0   . lamoTRIgine (LAMICTAL) 25 MG tablet Take 2 tablets (50 mg total) by mouth daily. 60 tablet 1   . levothyroxine (SYNTHROID) 88 MCG tablet Take 1 tablet (88 mcg total) by mouth daily. 30 tablet 1   . OLANZapine (ZYPREXA) 20 MG tablet Take 1 tablet (20 mg total) by mouth at bedtime. 30 tablet 1   . propranolol (INDERAL) 10 MG tablet Take 1 tablet (10 mg total) by mouth 2 (two) times daily. 60 tablet 1   . SENOKOT 8.6 MG tablet Take 2 tablets by mouth daily.     . sucralfate (CARAFATE) 1 g tablet Take 1 tablet (1 g total) by mouth 4 (four) times daily. 120 tablet 1   . valACYclovir (VALTREX) 500 MG tablet TAKE 1 TABLET(500 MG) BY MOUTH TWICE DAILY 60 tablet 5   . vitamin B-12 (CYANOCOBALAMIN) 1000 MCG tablet Take 1 tablet (1,000 mcg total) by mouth daily. (Patient not taking: Reported on 08/27/2019) 90 tablet 3     Patient Stressors:  Health problems Medication change or noncompliance  Patient Strengths: Technical sales engineer for treatment/growth Supportive family/friends  Treatment Modalities: Medication Management, Group therapy, Case management,  1 to 1 session with clinician, Psychoeducation, Recreational therapy.   Physician Treatment Plan for Primary Diagnosis: Bipolar 1 disorder ([place]) Long Term Goal(s): Improvement in symptoms so as ready for discharge Improvement in symptoms so as ready for discharge   Short Term Goals: Ability to verbalize feelings will improve Ability to demonstrate self-control will improve Ability to identify and develop effective coping behaviors will improve Ability to maintain clinical measurements within normal limits will improve Compliance with prescribed medications will improve  Medication Management: Evaluate patient's response, side effects, and tolerance of medication regimen.  Therapeutic Interventions: 1 to 1 sessions, Unit Group sessions and Medication administration.  Evaluation of Outcomes: Progressing  Physician Treatment Plan for Secondary Diagnosis: Principal Problem:   Bipolar 1 disorder ([place]) Active Problems:   Hypothyroidism   Gastroesophageal reflux disease with hiatal hernia   Irritable bowel syndrome  Long Term Goal(s): Improvement in symptoms so as ready for discharge Improvement in symptoms so as ready for discharge   Short Term Goals: Ability to verbalize feelings will improve Ability to demonstrate self-control will improve Ability to identify and develop effective coping behaviors will improve Ability to maintain clinical measurements within normal limits will improve Compliance with prescribed medications will improve     Medication Management: Evaluate patient's response, side effects, and tolerance of medication regimen.  Therapeutic Interventions: 1 to 1 sessions, Unit Group sessions and Medication  administration.  Evaluation of Outcomes: Progressing   RN Treatment Plan for Primary Diagnosis: Bipolar 1 disorder ([place]) Long Term Goal(s): Knowledge of disease and therapeutic regimen to maintain health will improve  Short Term Goals: Ability to demonstrate self-control, Ability to participate in decision making will improve, Ability to verbalize feelings will improve, Ability to disclose and discuss suicidal ideas, Ability to identify and develop effective coping behaviors will improve and Compliance with prescribed medications will improve  Medication Management: RN will administer medications as ordered by provider, will assess and evaluate patient's response and provide education to patient for prescribed medication. RN will report any adverse and/or side effects to prescribing provider.  Therapeutic Interventions: 1 on 1 counseling sessions, Psychoeducation, Medication administration, Evaluate responses to treatment, Monitor vital signs and CBGs as ordered, Perform/monitor CIWA, COWS, AIMS and Fall Risk screenings as ordered, Perform wound care treatments as ordered.  Evaluation of  Outcomes: Progressing   LCSW Treatment Plan for Primary Diagnosis: Bipolar 1 disorder ([place]) Long Term Goal(s): Safe transition to appropriate next level of care at discharge, Engage patient in therapeutic group addressing interpersonal concerns.  Short Term Goals: Engage patient in aftercare planning with referrals and resources, Increase social support, Increase ability to appropriately verbalize feelings, Increase emotional regulation, Facilitate acceptance of mental health diagnosis and concerns and Increase skills for wellness and recovery  Therapeutic Interventions: Assess for all discharge needs, 1 to 1 time with Social worker, Explore available resources and support systems, Assess for adequacy in community support network, Educate family and significant other(s) on suicide prevention, Complete  Psychosocial Assessment, Interpersonal group therapy.  Evaluation of Outcomes: Progressing   Progress in Treatment: Attending groups: Yes. Participating in groups: No. Taking medication as prescribed: Yes. Toleration medication: Yes. Family/Significant other contact made: Yes, individual(s) contacted:  SPE completed with the patient's daughter. Patient understands diagnosis: No. Discussing patient identified problems/goals with staff: Yes. Medical problems stabilized or resolved: No. Denies suicidal/homicidal ideation: Yes. Issues/concerns per patient self-inventory: No. Other: none  New problem(s) identified: No, Describe:  none  New Short Term/Long Term Goal(s):  elimination of symptoms of psychosis, medication management for mood stabilization; elimination of SI thoughts; development of comprehensive mental wellness plan.  Patient Goals:  Patient was give the opportunity to attend treatment team, however declined.  CSW will assist pt in developing appropriate discharge plans.   Discharge Plan or Barriers: Patient was give the opportunity to attend treatment team, however declined.  CSW will assist pt in developing appropriate discharge plans.  Update 03/31/20-Patient reports difficulty completing ADL's. Pt daughter reports some decompensation and suggest pt being referred to ALF or group home so she can be monitored regularly. CSW team consulting with ALF, long term memory care facilities, etc to explore placement options and develop appropriate discharge plan. Discharge plan TBD.  Reason for Continuation of Hospitalization: Anxiety Medical Issues Medication stabilization  Estimated Length of Stay: TBD  Recreational Therapy: Patient Stressors: N/A Patient Goal: Patient will engage in groups without prompting or encouragement from LRT x3 group sessions within 5 recreation therapy group sessions  Attendees: Patient:  03/31/2020 3:13 PM  Physician: Dr. Weber Cooks, MD 03/31/2020 3:13 PM   Nursing: Collier Bullock, Demetria Ravenell RN 03/31/2020 3:13 PM  RN Care Manager: 03/31/2020 3:13 PM  Social Worker: Sanjuana Kava Olivia Moton 03/31/2020 3:13 PM  Recreational Therapist:  03/31/2020 3:13 PM  Other:  03/31/2020 3:13 PM  Other:  03/31/2020 3:13 PM  Other: 03/31/2020 3:13 PM    Scribe for Treatment Team: Yvette Rack, LCSW 03/31/2020 3:13 PM

## 2020-03-31 NOTE — Progress Notes (Signed)
Recreation Therapy Notes  Date: 03/31/2020  Time: 9:30 am  Location: Craft Room  Behavioral response: Appropriate   Intervention Topic: Leisure  Discussion/Intervention:  Group content today was focused on leisure. The group defined what leisure is and some positive leisure activities they participate in. Individuals identified the difference between good and bad leisure. Participants expressed how they feel after participating in the leisure of their choice. The group discussed how they go about picking a leisure activity and if others are involved in their leisure activities. The patient stated how many leisure activities they too choose from and reasons why it is important to have leisure time. Individuals participated in the intervention "Leisure Jeopardy" where they had a chance to identify new leisure activities as well as benefits of leisure. Clinical Observations/Feedback:  Patient came to group and expressed that her leisure activities are walking the dog, taking the baby for a walk and reading the bible.  Individual was social with peers and staff while participating in the intervention during group.  Tysen Roesler LRT/CTRS            Miranda Frese 03/31/2020 1:02 PM

## 2020-03-31 NOTE — Progress Notes (Signed)
Pt has been cooperative and med compliant. She has shown some obsession regarding food and eating etc. She has remained anxious and slow. She still has a anxious, flat affect. Collier Bullock RN

## 2020-03-31 NOTE — Progress Notes (Signed)
CSW contacted Dorian Pod with [facility] who reported that she is unsure if pt is appropriate for [facility]osp[facility] and reported she would give CSW a call back. She requested CSW fax over some information. CSW faxed information and fax was successful.   Evalina Field, MSW, LCSW Clinical Social Work 03/31/2020 3:30 PM

## 2020-03-31 NOTE — Progress Notes (Signed)
[facility] MD Progress Note  03/31/2020 11:53 AM Leslie Wong  MRN:  [id number]   Subjective: Follow-up for this 64 year old female for diagnosis of bipolar 1 disorder.  Patient reports that she is feeling better today.  She denies any suicidal or homicidal ideations and denies any hallucinations, however, she continues to report feeling anxious and depressed but it is improving.  She also reports that she is trying to eat more but unfortunately she can only eat so much food at one time and it is difficult for her to have more food throughout the day.  She is in agreement with having snacks in between meals.  Patient does report that she was on Wellbutrin in the past but she was told that she had to come off of it and she is not sure why.  She does report that the Zyprexa has been one of the better medications for her and she states that she feels that it is helping her drastically.  Principal Problem: Bipolar 1 disorder ([place]) Diagnosis: Principal Problem:   Bipolar 1 disorder ([facility]) Active Problems:   Hypothyroidism   Gastroesophageal reflux disease with hiatal hernia   Irritable bowel syndrome  Total Time spent with patient: 30 minutes  Past Psychiatric History: Patient has had multiple hospitalizations with a diagnosis of bipolar disorder.  Complicated often by substance abuse.  Has had periods of delirious psychosis in the past but right now and on her last admission seems to be doing slightly better.  Has struggled with any kind of follow-up especially as she lives by herself.  Does have a past history of suicidality.  Past Medical History:  Past Medical History:  Diagnosis Date  . Back pain   . Cancer ([place])    skin  . Depression    BAD  . Gastroparesis   . GERD (gastroesophageal reflux disease)   . Hypothyroidism   . Insomnia   . Migraines   . Parkinson disease ([place])    per [place], dx'd 2019  . Recurrent cold sores   . Shoulder bursitis   . Urge incontinence     Past  Surgical History:  Procedure Laterality Date  . ABDOMINAL HYSTERECTOMY  1999   total  . APPENDECTOMY    . ESOPHAGOGASTRODUODENOSCOPY  01/2006   negative except small hiatal hernia  . ESOPHAGOGASTRODUODENOSCOPY  02/24/2015   See report  . LAPAROSCOPY     for endometriosis  . OVARIAN CYST REMOVAL  1990   unilateral  . TONSILLECTOMY AND ADENOIDECTOMY     Family History:  Family History  Problem Relation Age of Onset  . Hypertension Mother   . Arthritis Mother   . Diabetes Mother   . Stroke Mother   . Cancer Father        lung  . Colon cancer Neg Hx   . Breast cancer Neg Hx    Family Psychiatric  History: None reported Social History:  Social History   Substance and Sexual Activity  Alcohol Use No  . Alcohol/week: 0.0 standard drinks     Social History   Substance and Sexual Activity  Drug Use No    Social History   Socioeconomic History  . Marital status: Divorced    Spouse name: Not on file  . Number of children: Not on file  . Years of education: Not on file  . Highest education level: Not on file  Occupational History  . Not on file  Tobacco Use  . Smoking status: Current Some Day  Smoker    Packs/day: 1.00    Years: 42.50    Pack years: 42.50    Types: Cigarettes  . Smokeless tobacco: Former Systems developer  . Tobacco comment: declined  Substance and Sexual Activity  . Alcohol use: No    Alcohol/week: 0.0 standard drinks  . Drug use: No  . Sexual activity: Never  Other Topics Concern  . Not on file  Social History Narrative   Lives alone.     Has a dog names Dixie   Social Determinants of Health   Financial Resource Strain:   . Difficulty of Paying Living Expenses:   Food Insecurity:   . Worried About Charity fundraiser in the Last Year:   . Arboriculturist in the Last Year:   Transportation Needs:   . Film/video editor (Medical):   Marland Kitchen Lack of Transportation (Non-Medical):   Physical Activity:   . Days of Exercise per Week:   . Minutes of  Exercise per Session:   Stress:   . Feeling of Stress :   Social Connections:   . Frequency of Communication with Friends and Family:   . Frequency of Social Gatherings with Friends and Family:   . Attends Religious Services:   . Active Member of Clubs or Organizations:   . Attends Archivist Meetings:   Marland Kitchen Marital Status:    Additional Social History:                         Sleep: Good  Appetite:  Fair  Current Medications: Current Facility-Administered Medications  Medication Dose Route Frequency Provider Last Rate Last Admin  . acetaminophen (TYLENOL) tablet 650 mg  650 mg Oral Q6H PRN Caroline Sauger, NP   650 mg at 03/28/20 2120  . alum & mag hydroxide-simeth (MAALOX/MYLANTA) 200-200-20 MG/5ML suspension 30 mL  30 mL Oral Q4H PRN Caroline Sauger, NP   30 mL at 03/27/20 2112  . antiseptic oral rinse (BIOTENE) solution 15 mL  15 mL Mouth Rinse PRN Clorine Swing, Lowry Ram, FNP      . famotidine (PEPCID) tablet 20 mg  20 mg Oral BID [name], [name], MD   20 mg at 03/31/20 0803  . feeding supplement (ENSURE ENLIVE) (ENSURE ENLIVE) liquid 237 mL  237 mL Oral TID BM [name], [name], MD   237 mL at 03/31/20 1015  . hydrocerin (EUCERIN) cream   Topical BID [name], [name], MD   Given at 03/31/20 0805  . hydrOXYzine (ATARAX/VISTARIL) tablet 25 mg  25 mg Oral TID PRN [name], MD      . levothyroxine (SYNTHROID) tablet 88 mcg  88 mcg Oral Q0600 [name], [name], MD   88 mcg at 03/31/20 0616  . loperamide (IMODIUM) capsule 2 mg  2 mg Oral PRN [name], [name], MD   2 mg at 03/27/20 2125  . magnesium hydroxide (MILK OF MAGNESIA) suspension 30 mL  30 mL Oral Daily PRN Caroline Sauger, NP      . multivitamin with minerals tablet 1 tablet  1 tablet Oral Daily [name], [name], MD   1 tablet at 03/31/20 0804  . OLANZapine zydis (ZYPREXA) disintegrating tablet 20 mg  20 mg Oral QHS [name], [name], MD      . ondansetron (ZOFRAN-ODT) disintegrating tablet 4 mg  4 mg  Oral Q8H PRN [name], [name], MD      . oxyCODONE (Oxy IR/ROXICODONE) immediate release tablet 5 mg  5 mg Oral TID [name], [name], MD   5 mg at 03/31/20 0620  . pantoprazole (PROTONIX) EC tablet 40 mg  40 mg Oral Daily [name], [name], MD   40 mg at 03/31/20 0804  . traZODone (DESYREL) tablet 100 mg  100 mg Oral QHS [name], [name], MD   100 mg at 03/30/20 2127  . traZODone (DESYREL) tablet 50 mg  50 mg Oral QHS PRN Caroline Sauger, NP   50 mg at 03/28/20 2120    Lab Results: No results found for this or any previous visit (from the past 48 hour(s)).  Blood Alcohol level:  Lab Results  Component Value Date   ETH <10 03/24/2020   ETH <10 123XX123    Metabolic Disorder Labs: Lab Results  Component Value Date   HGBA1C 5.4 10/13/2018   MPG 108.28 10/13/2018   MPG 111.15 08/29/2018   No results found for: PROLACTIN Lab Results  Component Value Date   CHOL 182 01/03/2020   TRIG 278 (H) 01/03/2020   HDL 33 (L) 01/03/2020   CHOLHDL 5.5 01/03/2020   VLDL 56 (H) 01/03/2020   LDLCALC 93 01/03/2020   LDLCALC 103 (H) 10/13/2018    Physical Findings: AIMS:  , ,  ,  ,    CIWA:    COWS:     Musculoskeletal: Strength & Muscle Tone: decreased Gait & Station: normal Patient leans: N/A  Psychiatric Specialty Exam: Physical Exam  Nursing note and vitals reviewed. Constitutional: She is oriented to person, place, and time. She appears well-developed and well-nourished.  Cardiovascular: Normal rate.  Respiratory: Effort normal.  Musculoskeletal:        General: Normal range of motion.  Neurological: She is alert and oriented to person, place, and time.  Skin: Skin is warm.  Psychiatric: Her mood appears anxious. She is withdrawn. She exhibits a depressed mood.    Review of Systems  Constitutional: Negative.   HENT: Negative.   Eyes: Negative.   Respiratory: Negative.   Cardiovascular: Negative.   Gastrointestinal: Negative.   Genitourinary: Negative.    Musculoskeletal: Negative.   Skin: Negative.   Neurological: Negative.   Psychiatric/Behavioral: Positive for dysphoric mood. The patient is nervous/anxious.     Blood pressure (!) 145/88, pulse 92, temperature 98.5 F (36.9 C), temperature source Oral, resp. rate 18, height 4\' 11"  (1.499 m), weight 38.1 kg, SpO2 99 %.Body mass index is 16.97 kg/m.  General Appearance: Casual  Eye Contact:  Fair  Speech:  Clear and Coherent and Normal Rate  Volume:  Decreased  Mood:  Anxious and Depressed  Affect:  Flat  Thought Process:  Coherent and Descriptions of Associations: Intact  Orientation:  Full (Time, Place, and Person)  Thought Content:  WDL  Suicidal Thoughts:  No  Homicidal Thoughts:  No  Memory:  Immediate;   Fair Recent;   Fair Remote;   Fair  Judgement:  Fair  Insight:  Fair  Psychomotor Activity:  Normal  Concentration:  Concentration: Fair  Recall:  [employer] of Knowledge:  Fair  Language:  Fair  Akathisia:  No  Handed:  Right  AIMS (if indicated):     Assets:  Desire for Improvement Financial Resources/Insurance Housing Resilience  ADL's:  Intact  Cognition:  WNL  Sleep:  Number of Hours: 9   Assessment: Patient presents in room sitting on the bed and is pleasant, calm, cooperative.  Patient does report feeling anxious and appears anxious at times.  She does show some significant  improvement and she is much more talkative today than she has been on previous reports.  She does show some significant improvement and also reports that her depression and anxiety have improved.  Since patient does report being hungry but is unable to eat as much as she has on her plate at mealtimes have ordered a nursing communication to have snacks in between meals to assist with her intake.  We will continue all current medications without change.  Treatment Plan Summary: Daily contact with patient to assess and evaluate symptoms and progress in treatment and Medication  management Continue Vistaril 25 mg p.o. 3 times daily as needed for anxiety Continue Synthroid 88 mcg p.o. daily for hypothyroidism Increase Zyprexa Zydis 20 mg p.o. nightly for bipolar 1 disorder Continue trazodone 100 mg p.o. nightly for insomnia Encourage group therapy participation Continue every 15 minute safety checks Nursing order placed to provide snacks in between meals to assist with intake  Lewis Shock, FNP 03/31/2020, 11:53 AM

## 2020-03-31 NOTE — Plan of Care (Signed)
Pt rates depression 8/10, anxiety 8/10 and hopelessness 6/10. Pt was educated on care plan and verbalizes understanding. Collier Bullock RN Problem: Education: Goal: Knowledge of [facility] Education information/materials will improve Outcome: Progressing Goal: Emotional status will improve Outcome: Progressing Goal: Mental status will improve Outcome: Progressing Goal: Verbalization of understanding the information provided will improve Outcome: Progressing   Problem: Safety: Goal: Periods of time without injury will increase Outcome: Progressing   Problem: Education: Goal: Ability to state activities that reduce stress will improve Outcome: Progressing   Problem: Self-Concept: Goal: Ability to identify factors that promote anxiety will improve Outcome: Progressing Goal: Level of anxiety will decrease Outcome: Progressing Goal: Ability to modify response to factors that promote anxiety will improve Outcome: Progressing

## 2020-04-01 LAB — GLUCOSE, CAPILLARY: Glucose-Capillary: 100 mg/dL — ABNORMAL HIGH (ref 70–99)

## 2020-04-01 NOTE — Plan of Care (Signed)
Pt rates depression 9/10, hopelessness and anxiety both 8/10. Pt was educated on care plan and verbalizes understanding. Collier Bullock RN Problem: Education: Goal: Knowledge of [place] [facility] Education information/materials will improve Outcome: Progressing Goal: Emotional status will improve Outcome: Progressing Goal: Mental status will improve Outcome: Progressing Goal: Verbalization of understanding the information provided will improve Outcome: Progressing   Problem: Safety: Goal: Periods of time without injury will increase Outcome: Progressing   Problem: Education: Goal: Ability to state activities that reduce stress will improve Outcome: Progressing   Problem: Self-Concept: Goal: Ability to identify factors that promote anxiety will improve Outcome: Progressing Goal: Level of anxiety will decrease Outcome: Progressing Goal: Ability to modify response to factors that promote anxiety will improve Outcome: Progressing

## 2020-04-01 NOTE — Progress Notes (Signed)
Pt still has an anxious affect. She has been sleeping more today and withdrawn. She has been cooperative. Collier Bullock RN

## 2020-04-01 NOTE — Progress Notes (Signed)
Recreation Therapy Notes   Date: 04/01/2020  Time: 9:30 am   Location: Craft room   Behavioral response: N/A   Intervention Topic: Goals    Discussion/Intervention: Patient did not attend group.   Clinical Observations/Feedback:  Patient did not attend group.   Evette Diclemente LRT/CTRS        Micaila Ziemba 04/01/2020 10:49 AM

## 2020-04-01 NOTE — NC FL2 (Signed)
[place] LEVEL OF CARE SCREENING TOOL     IDENTIFICATION  Patient Name: Leslie Wong Birthdate: December 25, 1956 Sex: female Admission Date (Current Location): 03/24/2020  [place] and [place] Number:  Engineering geologist and Address:  [facility], [address], [place], [place] [address]      Provider Number: [id number]  Attending Physician Name and Address:  [name], [name], MD  Relative Name and Phone Number:       Current Level of Care: Hospital Recommended Level of Care: [place], Other (Comment)(Long term care) Prior Approval Number:    Date Approved/Denied:   PASRR Number:    Discharge Plan: Other (Comment)(ALF, long-term care)    Current Diagnoses: Patient Active Problem List   Diagnosis Date Noted  . Bipolar 1 disorder ([place]) 03/24/2020  . Bipolar I disorder, most recent episode depressed ([place]) 01/07/2020  . MDD (major depressive disorder) 01/02/2020  . Depression 12/11/2019  . Paranoia ([place])   . Cannabis abuse 05/28/2019  . Bipolar 1 disorder, mixed, severe ([place]) 05/27/2019  . Opiate dependence ([place]) 04/25/2019  . Cracking skin 01/27/2019  . Delusion ([place]) 09/26/2018  . Night sweats 05/13/2018  . Pupil asymmetry 05/13/2018  . DDD (degenerative disc disease), cervical 04/23/2018  . Parkinson disease ([place])   . HLD (hyperlipidemia) 03/10/2016  . Advance care planning 03/10/2016  . Cervical radiculopathy 07/30/2015  . Osteopenia 04/18/2014  . Medicare annual wellness visit, subsequent 03/18/2014  . Shoulder pain 01/17/2013  . Chronic back pain 09/14/2011  . Hypercalcemia 03/25/2010  . INCONTINENCE, URGE 04/16/2009  . CARPAL TUNNEL SYNDROME, BILATERAL 09/24/2008  . 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 Hypothyroidism 06/05/2007  . Bipolar affective disorder, current episode hypomanic ([place]) 06/05/2007  . MIGRAINE HEADACHE 06/05/2007  . Gastroesophageal reflux disease with hiatal hernia 06/05/2007   . Irritable bowel syndrome 06/05/2007  . FIBROCYSTIC BREAST DISEASE 06/05/2007    Orientation RESPIRATION BLADDER Height & Weight     Self, Time, Situation, Place  Normal Continent Weight: 84 lb (38.1 kg) Height:  4\' 11"  (149.9 cm)  BEHAVIORAL SYMPTOMS/MOOD NEUROLOGICAL BOWEL NUTRITION STATUS  (none reported) (none reported) Continent Diet(Pt unable to eat more than a little at a time)  AMBULATORY STATUS COMMUNICATION OF NEEDS Skin   Independent Verbally Normal                       Personal Care Assistance Level of Assistance  (Pt is able to complete ADL's but does not at times when not on her medications)           Functional Limitations Info  (none reported)          SPECIAL CARE FACTORS FREQUENCY  (none reported)                    Contractures Contractures Info: Not present    Additional Factors Info  Code Status Code Status Info: FULL             Current Medications (04/01/2020):  This is the current hospital active medication list Current Facility-Administered Medications  Medication Dose Route Frequency Provider Last Rate Last Admin  . acetaminophen (TYLENOL) tablet 650 mg  650 mg Oral Q6H PRN Caroline Sauger, NP   650 mg at 03/28/20 2120  . alum & mag hydroxide-simeth (MAALOX/MYLANTA) 200-200-20 MG/5ML suspension 30 mL  30 mL Oral Q4H PRN Caroline Sauger, NP   30 mL at 03/31/20 2114  . antiseptic oral rinse (BIOTENE) solution  15 mL  15 mL Mouth Rinse PRN Money, Lowry Ram, FNP      . famotidine (PEPCID) tablet 20 mg  20 mg Oral BID [name], [name], MD   20 mg at 04/01/20 0906  . feeding supplement (ENSURE ENLIVE) (ENSURE ENLIVE) liquid 237 mL  237 mL Oral TID BM [name], [name], MD   237 mL at 03/31/20 2000  . hydrocerin (EUCERIN) cream   Topical BID [name], [name], MD   Stopped at 04/01/20 [phone]  . hydrOXYzine (ATARAX/VISTARIL) tablet 25 mg  25 mg Oral TID PRN [name], MD      . levothyroxine (SYNTHROID) tablet 88 mcg  88 mcg  Oral Q0600 [name], [name], MD   88 mcg at 04/01/20 [id number]  . loperamide (IMODIUM) capsule 2 mg  2 mg Oral PRN [name], [name], MD   2 mg at 03/27/20 2125  . magnesium hydroxide (MILK OF MAGNESIA) suspension 30 mL  30 mL Oral Daily PRN Caroline Sauger, NP      . multivitamin with minerals tablet 1 tablet  1 tablet Oral Daily [name], [name], MD   1 tablet at 04/01/20 0907  . OLANZapine zydis (ZYPREXA) disintegrating tablet 20 mg  20 mg Oral QHS [name], [name], MD   20 mg at 03/31/20 2115  . ondansetron (ZOFRAN-ODT) disintegrating tablet 4 mg  4 mg Oral Q8H PRN [name], [name], MD      . oxyCODONE (Oxy IR/ROXICODONE) immediate release tablet 5 mg  5 mg Oral TID [name], [name], MD   5 mg at 04/01/20 0906  . pantoprazole (PROTONIX) EC tablet 40 mg  40 mg Oral Daily [name], [name], MD   40 mg at 04/01/20 [id number]  . traZODone (DESYREL) tablet 100 mg  100 mg Oral QHS [name], [name], MD   100 mg at 03/31/20 2114  . traZODone (DESYREL) tablet 50 mg  50 mg Oral QHS PRN Caroline Sauger, NP   50 mg at 03/31/20 2115     Discharge Medications: Please see discharge summary for a list of discharge medications.  Relevant Imaging Results:  Relevant Lab Results:   Additional Miranda, LCSW

## 2020-04-01 NOTE — Progress Notes (Signed)
[facility] MD Progress Note  04/01/2020 11:44 AM Lauree Olszewski  MRN:  [id number]   Subjective: Follow-up for this 64 year old female diagnosed with bipolar 1 disorder.  Patient reports that she is feeling better today but still continues to feel depressed.  She reports that all the problems that she has with her body is what makes her feel depressed.  She denies any suicidal homicidal ideations and denies any hallucinations.  However patient then starts discussing how she has improved and how she has been trying to eat more and she is doing better at it.  Patient was encouraged to look at the things that improve her since she has been at the hospital and the patient is actually able to smile and joke a little bit but continues to report depression.  She states that she does feel that she is improving enough so that she can potentially leave by approximately Friday.  She reports sleeping better last night and states that her appetite has improved and ate 2 pieces of [place] toast for breakfast this morning.  Principal Problem: Bipolar 1 disorder ([place]) Diagnosis: Principal Problem:   Bipolar 1 disorder (HCC) Active Problems:   Hypothyroidism   Gastroesophageal reflux disease with hiatal hernia   Irritable bowel syndrome  Total Time spent with patient: 20 minutes  Past Psychiatric History: Patient has had multiple hospitalizations with a diagnosis of bipolar disorder. Complicated often by substance abuse. Has had periods of delirious psychosis in the past but right now and on her last admission seems to be doing slightly better. Has struggled with any kind of follow-up especially as she lives by herself. Does have a past history of suicidality.  Past Medical History:  Past Medical History:  Diagnosis Date  . Back pain   . Cancer ([place])    skin  . Depression    BAD  . Gastroparesis   . GERD (gastroesophageal reflux disease)   . Hypothyroidism   . Insomnia   . Migraines   . Parkinson  disease ([place])    per [facility], dx'd 2019  . Recurrent cold sores   . Shoulder bursitis   . Urge incontinence     Past Surgical History:  Procedure Laterality Date  . ABDOMINAL HYSTERECTOMY  1999   total  . APPENDECTOMY    . ESOPHAGOGASTRODUODENOSCOPY  01/2006   negative except small hiatal hernia  . ESOPHAGOGASTRODUODENOSCOPY  02/24/2015   See report  . LAPAROSCOPY     for endometriosis  . OVARIAN CYST REMOVAL  1990   unilateral  . TONSILLECTOMY AND ADENOIDECTOMY     Family History:  Family History  Problem Relation Age of Onset  . Hypertension Mother   . Arthritis Mother   . Diabetes Mother   . Stroke Mother   . Cancer Father        lung  . Colon cancer Neg Hx   . Breast cancer Neg Hx    Family Psychiatric  History: None reported Social History:  Social History   Substance and Sexual Activity  Alcohol Use No  . Alcohol/week: 0.0 standard drinks     Social History   Substance and Sexual Activity  Drug Use No    Social History   Socioeconomic History  . Marital status: Divorced    Spouse name: Not on file  . Number of children: Not on file  . Years of education: Not on file  . Highest education level: Not on file  Occupational History  . Not on file  Tobacco Use  . Smoking status: Current Some Day Smoker    Packs/day: 1.00    Years: 42.50    Pack years: 42.50    Types: Cigarettes  . Smokeless tobacco: Former Systems developer  . Tobacco comment: declined  Substance and Sexual Activity  . Alcohol use: No    Alcohol/week: 0.0 standard drinks  . Drug use: No  . Sexual activity: Never  Other Topics Concern  . Not on file  Social History Narrative   Lives alone.     Has a dog names Dixie   Social Determinants of Health   Financial Resource Strain:   . Difficulty of Paying Living Expenses:   Food Insecurity:   . Worried About Charity fundraiser in the Last Year:   . Arboriculturist in the Last Year:   Transportation Needs:   . Film/video editor  (Medical):   Marland Kitchen Lack of Transportation (Non-Medical):   Physical Activity:   . Days of Exercise per Week:   . Minutes of Exercise per Session:   Stress:   . Feeling of Stress :   Social Connections:   . Frequency of Communication with Friends and Family:   . Frequency of Social Gatherings with Friends and Family:   . Attends Religious Services:   . Active Member of Clubs or Organizations:   . Attends Archivist Meetings:   Marland Kitchen Marital Status:    Additional Social History:                         Sleep: Good  Appetite:  Fair improving  Current Medications: Current Facility-Administered Medications  Medication Dose Route Frequency Provider Last Rate Last Admin  . acetaminophen (TYLENOL) tablet 650 mg  650 mg Oral Q6H PRN Caroline Sauger, NP   650 mg at 03/28/20 2120  . alum & mag hydroxide-simeth (MAALOX/MYLANTA) 200-200-20 MG/5ML suspension 30 mL  30 mL Oral Q4H PRN Caroline Sauger, NP   30 mL at 03/31/20 2114  . antiseptic oral rinse (BIOTENE) solution 15 mL  15 mL Mouth Rinse PRN Henderson Fr[place], Lowry Ram, FNP      . famotidine (PEPCID) tablet 20 mg  20 mg Oral BID [name], [name], MD   20 mg at 04/01/20 0906  . feeding supplement (ENSURE ENLIVE) (ENSURE ENLIVE) liquid 237 mL  237 mL Oral TID BM [name], [name], MD   237 mL at 04/01/20 1000  . hydrocerin (EUCERIN) cream   Topical BID [name], [name], MD   Stopped at 04/01/20 [phone]  . hydrOXYzine (ATARAX/VISTARIL) tablet 25 mg  25 mg Oral TID PRN [name], MD      . levothyroxine (SYNTHROID) tablet 88 mcg  88 mcg Oral Q0600 [name], [name], MD   88 mcg at 04/01/20 [id number]  . loperamide (IMODIUM) capsule 2 mg  2 mg Oral PRN [name], [name], MD   2 mg at 03/27/20 2125  . magnesium hydroxide (MILK OF MAGNESIA) suspension 30 mL  30 mL Oral Daily PRN Caroline Sauger, NP      . multivitamin with minerals tablet 1 tablet  1 tablet Oral Daily [name], [name], MD   1 tablet at 04/01/20 0907  . OLANZapine zydis  (ZYPREXA) disintegrating tablet 20 mg  20 mg Oral QHS [name], [name], MD   20 mg at 03/31/20 2115  . ondansetron (ZOFRAN-ODT) disintegrating tablet 4 mg  4 mg Oral Q8H PRN [name], [name], MD      .  oxyCODONE (Oxy IR/ROXICODONE) immediate release tablet 5 mg  5 mg Oral TID [name], [name], MD   5 mg at 04/01/20 0906  . pantoprazole (PROTONIX) EC tablet 40 mg  40 mg Oral Daily [name], [name], MD   40 mg at 04/01/20 [id number]  . traZODone (DESYREL) tablet 100 mg  100 mg Oral QHS [name], [name], MD   100 mg at 03/31/20 2114  . traZODone (DESYREL) tablet 50 mg  50 mg Oral QHS PRN Caroline Sauger, NP   50 mg at 03/31/20 2115    Lab Results:  Results for orders placed or performed during the hospital encounter of 03/24/20 (from the past 48 hour(s))  Glucose, capillary     Status: Abnormal   Collection Time: 04/01/20  6:28 AM  Result Value Ref Range   Glucose-Capillary 100 (H) 70 - 99 mg/dL    Comment: Glucose reference range applies only to samples taken after fasting for at least 8 hours.    Blood Alcohol level:  Lab Results  Component Value Date   ETH <10 03/24/2020   ETH <10 [id number]    Metabolic Disorder Labs: Lab Results  Component Value Date   HGBA1C 5.4 10/13/2018   MPG 108.28 10/13/2018   MPG 111.15 08/29/2018   No results found for: PROLACTIN Lab Results  Component Value Date   CHOL 182 01/03/2020   TRIG 278 (H) 01/03/2020   HDL 33 (L) 01/03/2020   CHOLHDL 5.5 01/03/2020   VLDL 56 (H) 01/03/2020   LDLCALC 93 01/03/2020   LDLCALC 103 (H) 10/13/2018    Physical Findings: AIMS:  , ,  ,  ,    CIWA:    COWS:     Musculoskeletal: Strength & Muscle Tone: within normal limits Gait & Station: normal Patient leans: N/A  Psychiatric Specialty Exam: Physical Exam  Nursing note and vitals reviewed. Constitutional: She is oriented to person, place, and time. She appears well-developed and well-nourished.  Cardiovascular: Normal rate.  Respiratory: Effort normal.   Musculoskeletal:        General: Normal range of motion.  Neurological: She is alert and oriented to person, place, and time.  Skin: Skin is warm.    Review of Systems  Constitutional: Negative.   HENT: Negative.   Eyes: Negative.   Respiratory: Negative.   Cardiovascular: Negative.   Gastrointestinal: Negative.   Genitourinary: Negative.   Musculoskeletal: Negative.   Skin: Negative.   Neurological: Negative.   Psychiatric/Behavioral: Positive for dysphoric mood. The patient is nervous/anxious.     Blood pressure 112/87, pulse 96, temperature 98.2 F (36.8 C), resp. rate 17, height 4\' 11"  (1.499 m), weight 38.1 kg, SpO2 98 %.Body mass index is 16.97 kg/m.  General Appearance: Casual  Eye Contact:  Good  Speech:  Clear and Coherent and Normal Rate  Volume:  Decreased  Mood:  Anxious and Depressed  Affect:  Congruent  Thought Process:  Coherent and Descriptions of Associations: Intact  Orientation:  Full (Time, Place, and Person)  Thought Content:  WDL  Suicidal Thoughts:  No  Homicidal Thoughts:  No  Memory:  Immediate;   Fair Recent;   Fair Remote;   Fair  Judgement:  Fair  Insight:  Fair  Psychomotor Activity:  Decreased  Concentration:  Concentration: Fair  Recall:  [employer] of Knowledge:  Fair  Language:  Fair  Akathisia:  No  Handed:  Right  AIMS (if indicated):     Assets:  Desire for Improvement Financial Resources/Insurance Housing  Resilience Social Support  ADL's:  Intact  Cognition:  WNL  Sleep:  Number of Hours: 8.45   Assessment: Patient presents in the day room and is watching TV with peers.  Patient follows me to her room and is pleasant, calm, and cooperative.  Patient has shown significant improvement since she has been admitted to the hospital.  She still reports depression but does not appear to be as depressed as she was when she presented.  She has been increasing her appetite and has been sleeping well.  Patient is much more talkative and  has logical conversation.  Patient is in agreement with continue her medications and also discusses with me the potential of going to an assisted living facility potentially in the future.  She states that she does have her sister and her daughter that would assist her with finding placement when she is discharged from the hospital.  She states she is concerned about going to a place that would lock her away so she cannot ever leave and I informed her that at the moment that she could look at variety of places and find one that would meet her needs.  I am and I also in agreement with the patient potentially leaving by Friday with her continued improvement.  Patient has been eating better and has been shown to be more talkative and interactive with peers and staff appropriately.  Treatment Plan Summary: Daily contact with patient to assess and evaluate symptoms and progress in treatment and Medication management Continue Vistaril 25 mg p.o. 3 times daily as needed for anxiety Continue Synthroid 88 mcg p.o. daily for hypothyroidism Continue Zyprexa Zydis 20 mg p.o. nightly for bipolar 1 disorder Continue trazodone 100 mg p.o. nightly for insomnia Continue Protonix 40 mg p.o. daily for GERD Encourage group therapy participation Continue every 15 minute safety checks Potential discharge plan for Friday, 04/03/2020  Lewis Shock, FNP 04/01/2020, 11:44 AM

## 2020-04-01 NOTE — [facility] Group Notes (Signed)
LCSW Group Therapy Note  04/01/2020 1:00 PM  Type of Therapy/Topic:  Group Therapy:  Emotion Regulation  Participation Level:  Did Not Attend   Description of Group:   The purpose of this group is to assist patients in learning to regulate negative emotions and experience positive emotions. Patients will be guided to discuss ways in which they have been vulnerable to their negative emotions. These vulnerabilities will be juxtaposed with experiences of positive emotions or situations, and patients will be challenged to use positive emotions to combat negative ones. Special emphasis will be placed on coping with negative emotions in conflict situations, and patients will process healthy conflict resolution skills.  Therapeutic Goals: 1. Patient will identify two positive emotions or experiences to reflect on in order to balance out negative emotions 2. Patient will label two or more emotions that they find the most difficult to experience 3. Patient will demonstrate positive conflict resolution skills through discussion and/or role plays  Summary of Patient Progress: X  Therapeutic Modalities:   Cognitive Behavioral Therapy Feelings Identification Dialectical Behavioral Therapy  Assunta Curtis, MSW, LCSW 04/01/2020 2:32 PM

## 2020-04-01 NOTE — Progress Notes (Signed)
Client alert and self ambulates. Denies depression, anxiety, SI/HI/AVH. C/o pain in "whole body" rated 8/10. Also worried about misplaced pants that she needs before discharge that were possibly lost during washing. Client was pleasant and cooperative during shift. Safety monitoring ongoing.

## 2020-04-02 ENCOUNTER — Telehealth: Payer: Self-pay

## 2020-04-02 NOTE — Progress Notes (Signed)
Recreation Therapy Notes   Date: 04/02/2020  Time: 9:30 am   Location: Outside    Behavioral response: N/A   Intervention Topic: Animal Assisted therapy    Discussion/Intervention: Patient did not attend group.   Clinical Observations/Feedback:  Patient did not attend group.   Charisse Wendell LRT/CTRS        Nella Botsford 04/02/2020 10:22 AM

## 2020-04-02 NOTE — Plan of Care (Signed)
°  Problem: Group Participation °Goal: STG - Patient will engage in groups without prompting or encouragement from LRT x3 group sessions within 5 recreation therapy group sessions °Description: STG - Patient will engage in groups without prompting or encouragement from LRT x3 group sessions within 5 recreation therapy group sessions °Outcome: Progressing °  °

## 2020-04-02 NOTE — Telephone Encounter (Signed)
Did not contact patient at this time to notify them that it is time to schedule the low dose lung cancer screening CT scan due to current hospital admission.

## 2020-04-02 NOTE — [facility] Group Notes (Signed)
[place] Group Notes:  (Nursing/MHT/Case Management/Adjunct)  Date:  04/02/2020  Time:  9:02 PM  Type of Therapy:  Group Therapy  Participation Level:  Did Not Attend  Leslie Wong 04/02/2020, 9:02 PM

## 2020-04-02 NOTE — Progress Notes (Signed)
[facility] MD Progress Note  04/02/2020 10:13 AM Leslie Wong  MRN:  [id number]   Subjective: Follow-up for this 64 year old female diagnosed with bipolar 1 disorder.  Patient states that she feels that she is about the same as she was yesterday.  She reports her depression at a 7 out of 10 with 10 being the worst and still has some anxiety.  She does perk up and states that she ate a little more food this morning for breakfast and ate her whole pancake.  She states that she feels that she has been eating better and has been trying to drink more.  She states that she did call her daughter yesterday about potentially coming home tomorrow and she said that her daughter will be going picking her up.  She continues to report that she will discuss with her daughter and her sister about a potential assisted living facility after she is discharged.  She denies any suicidal homicidal ideations and denies any hallucinations  Principal Problem: Bipolar 1 disorder ([place]) Diagnosis: Principal Problem:   Bipolar 1 disorder ([place]) Active Problems:   Hypothyroidism   Gastroesophageal reflux disease with hiatal hernia   Irritable bowel syndrome  Total Time spent with patient: 20 minutes  Past Psychiatric History: Patient has had multiple hospitalizations with a diagnosis of bipolar disorder. Complicated often by substance abuse. Has had periods of delirious psychosis in the past but right now and on her last admission seems to be doing slightly better. Has struggled with any kind of follow-up especially as she lives by herself. Does have a past history of suicidality.  Past Medical History:  Past Medical History:  Diagnosis Date  . Back pain   . Cancer ([place])    skin  . Depression    BAD  . Gastroparesis   . GERD (gastroesophageal reflux disease)   . Hypothyroidism   . Insomnia   . Migraines   . Parkinson disease ([place])    per [facility], dx'd 2019  . Recurrent cold sores   . Shoulder bursitis    . Urge incontinence     Past Surgical History:  Procedure Laterality Date  . ABDOMINAL HYSTERECTOMY  1999   total  . APPENDECTOMY    . ESOPHAGOGASTRODUODENOSCOPY  01/2006   negative except small hiatal hernia  . ESOPHAGOGASTRODUODENOSCOPY  02/24/2015   See report  . LAPAROSCOPY     for endometriosis  . OVARIAN CYST REMOVAL  1990   unilateral  . TONSILLECTOMY AND ADENOIDECTOMY     Family History:  Family History  Problem Relation Age of Onset  . Hypertension Mother   . Arthritis Mother   . Diabetes Mother   . Stroke Mother   . Cancer Father        lung  . Colon cancer Neg Hx   . Breast cancer Neg Hx    Family Psychiatric  History: None reported Social History:  Social History   Substance and Sexual Activity  Alcohol Use No  . Alcohol/week: 0.0 standard drinks     Social History   Substance and Sexual Activity  Drug Use No    Social History   Socioeconomic History  . Marital status: Divorced    Spouse name: Not on file  . Number of children: Not on file  . Years of education: Not on file  . Highest education level: Not on file  Occupational History  . Not on file  Tobacco Use  . Smoking status: Current Some Day Smoker  Packs/day: 1.00    Years: 42.50    Pack years: 42.50    Types: Cigarettes  . Smokeless tobacco: Former Systems developer  . Tobacco comment: declined  Substance and Sexual Activity  . Alcohol use: No    Alcohol/week: 0.0 standard drinks  . Drug use: No  . Sexual activity: Never  Other Topics Concern  . Not on file  Social History Narrative   Lives alone.     Has a dog names Dixie   Social Determinants of Health   Financial Resource Strain:   . Difficulty of Paying Living Expenses:   Food Insecurity:   . Worried About Charity fundraiser in the Last Year:   . Arboriculturist in the Last Year:   Transportation Needs:   . Film/video editor (Medical):   Marland Kitchen Lack of Transportation (Non-Medical):   Physical Activity:   . Days of  Exercise per Week:   . Minutes of Exercise per Session:   Stress:   . Feeling of Stress :   Social Connections:   . Frequency of Communication with Friends and Family:   . Frequency of Social Gatherings with Friends and Family:   . Attends Religious Services:   . Active Member of Clubs or Organizations:   . Attends Archivist Meetings:   Marland Kitchen Marital Status:    Additional Social History:                         Sleep: Good  Appetite:  Fair improving  Current Medications: Current Facility-Administered Medications  Medication Dose Route Frequency Provider Last Rate Last Admin  . acetaminophen (TYLENOL) tablet 650 mg  650 mg Oral Q6H PRN Caroline Sauger, NP   650 mg at 04/01/20 1643  . alum & mag hydroxide-simeth (MAALOX/MYLANTA) 200-200-20 MG/5ML suspension 30 mL  30 mL Oral Q4H PRN Caroline Sauger, NP   30 mL at 04/01/20 2110  . antiseptic oral rinse (BIOTENE) solution 15 mL  15 mL Mouth Rinse PRN Jonah Gingras, Lowry Ram, FNP      . famotidine (PEPCID) tablet 20 mg  20 mg Oral BID [name], [name], MD   20 mg at 04/02/20 0753  . feeding supplement (ENSURE ENLIVE) (ENSURE ENLIVE) liquid 237 mL  237 mL Oral TID BM [name], [name], MD   237 mL at 04/01/20 2105  . hydrocerin (EUCERIN) cream   Topical BID [name], [name], MD   Given at 04/02/20 [phone]  . hydrOXYzine (ATARAX/VISTARIL) tablet 25 mg  25 mg Oral TID PRN [name], MD      . levothyroxine (SYNTHROID) tablet 88 mcg  88 mcg Oral Q0600 [name], [name], MD   88 mcg at 04/02/20 0610  . loperamide (IMODIUM) capsule 2 mg  2 mg Oral PRN [name], [name], MD   2 mg at 03/27/20 2125  . magnesium hydroxide (MILK OF MAGNESIA) suspension 30 mL  30 mL Oral Daily PRN Caroline Sauger, NP      . multivitamin with minerals tablet 1 tablet  1 tablet Oral Daily [name], [name], MD   1 tablet at 04/02/20 0754  . OLANZapine zydis (ZYPREXA) disintegrating tablet 20 mg  20 mg Oral QHS [name], [name], MD   20 mg at 04/01/20 2105   . ondansetron (ZOFRAN-ODT) disintegrating tablet 4 mg  4 mg Oral Q8H PRN [name], [name], MD      . oxyCODONE (Oxy IR/ROXICODONE) immediate release tablet 5 mg  5 mg Oral TID [name], [name], MD   5 mg at 04/02/20 0657  . pantoprazole (PROTONIX) EC tablet 40 mg  40 mg Oral Daily [name], [name], MD   40 mg at 04/02/20 0657  . traZODone (DESYREL) tablet 100 mg  100 mg Oral QHS [name], [name], MD   100 mg at 04/01/20 2105    Lab Results:  Results for orders placed or performed during the hospital encounter of 03/24/20 (from the past 48 hour(s))  Glucose, capillary     Status: Abnormal   Collection Time: 04/01/20  6:28 AM  Result Value Ref Range   Glucose-Capillary 100 (H) 70 - 99 mg/dL    Comment: Glucose reference range applies only to samples taken after fasting for at least 8 hours.    Blood Alcohol level:  Lab Results  Component Value Date   ETH <10 03/24/2020   ETH <10 123XX123    Metabolic Disorder Labs: Lab Results  Component Value Date   HGBA1C 5.4 10/13/2018   MPG 108.28 10/13/2018   MPG 111.15 08/29/2018   No results found for: PROLACTIN Lab Results  Component Value Date   CHOL 182 01/03/2020   TRIG 278 (H) 01/03/2020   HDL 33 (L) 01/03/2020   CHOLHDL 5.5 01/03/2020   VLDL 56 (H) 01/03/2020   LDLCALC 93 01/03/2020   LDLCALC 103 (H) 10/13/2018    Physical Findings: AIMS:  , ,  ,  ,    CIWA:    COWS:     Musculoskeletal: Strength & Muscle Tone: within normal limits Gait & Station: normal Patient leans: N/A  Psychiatric Specialty Exam: Physical Exam  Nursing note and vitals reviewed. Constitutional: She is oriented to person, place, and time. She appears well-developed and well-nourished.  Cardiovascular: Normal rate.  Respiratory: Effort normal.  Musculoskeletal:        General: Normal range of motion.  Neurological: She is alert and oriented to person, place, and time.  Skin: Skin is warm.    Review of Systems  Constitutional: Negative.    HENT: Negative.   Eyes: Negative.   Respiratory: Negative.   Cardiovascular: Negative.   Gastrointestinal: Negative.   Genitourinary: Negative.   Musculoskeletal: Negative.   Skin: Negative.   Neurological: Negative.   Psychiatric/Behavioral: Positive for dysphoric mood. The patient is nervous/anxious.     Blood pressure 105/84, pulse 84, temperature 98.6 F (37 C), temperature source Oral, resp. rate 18, height 4\' 11"  (1.499 m), weight 38.1 kg, SpO2 96 %.Body mass index is 16.97 kg/m.  General Appearance: Casual  Eye Contact:  Good  Speech:  Clear and Coherent and Normal Rate  Volume:  Decreased  Mood:  Anxious and Depressed  Affect:  Congruent  Thought Process:  Coherent and Descriptions of Associations: Intact  Orientation:  Full (Time, Place, and Person)  Thought Content:  WDL  Suicidal Thoughts:  No  Homicidal Thoughts:  No  Memory:  Immediate;   Fair Recent;   Fair Remote;   Fair  Judgement:  Fair  Insight:  Fair  Psychomotor Activity:  Decreased  Concentration:  Concentration: Fair  Recall:  [employer] of Knowledge:  Fair  Language:  Fair  Akathisia:  No  Handed:  Right  AIMS (if indicated):     Assets:  Desire for Improvement Financial Resources/Insurance Housing Resilience Social Support  ADL's:  Intact  Cognition:  WNL  Sleep:  Number of Hours: 8   Assessment: Patient presents patient continues to endorse depression and  anxiety but he states shows some improvement.  She is continued to deny any suicidal or homicidal ideations.  Patient has not been aggressive or agitated towards anyone thus far.  With her continued improvement with her current medications and her discussing with her daughter about discharging tomorrow feel that it would be safe for discharge of this patient tomorrow with medications.  She states that she will continue her current medications and will also get her follow-up appointments after discharge.  She has shown improvement with her  sleep as well as her appetite.  Treatment Plan Summary: Daily contact with patient to assess and evaluate symptoms and progress in treatment and Medication management Continue Vistaril 25 mg p.o. 3 times daily as needed for anxiety Continue Synthroid 88 mcg p.o. daily for hypothyroidism Continue Zyprexa Zydis 20 mg p.o. nightly for bipolar 1 disorder Continue trazodone 100 mg p.o. nightly for insomnia Continue Protonix 40 mg p.o. daily for GERD Encourage group therapy participation Continue every 15 minute safety checks Potential discharge plan for Friday, 04/03/2020  Lewis Shock, FNP 04/02/2020, 10:13 AM

## 2020-04-02 NOTE — Progress Notes (Signed)
Compared to yesterday pt seems to be the same. She did not attend groups. She was isolative to her room. She has an anxious affect and mood. She refused to eat dinner so I encouraged her to drink the Ensure. Collier Bullock RN

## 2020-04-02 NOTE — BHH Group Notes (Signed)
[facility] Group Notes:  (Nursing/MHT/Case Management/Adjunct)  Date:  04/02/2020  Time:  8:46 AM  Type of Therapy:  Community Meeting  Participation Level:  Did Not Attend   Adela Lank [facility] 04/02/2020, 8:46 AM

## 2020-04-02 NOTE — Plan of Care (Signed)
Pt rates depression 7/10 and anxiety 8/10. Pt denies SI, HI and AVH. Pt was educated on care plan and verbalizes understanding. Pt was encouraged to attend groups. Collier Bullock RN Problem: Education: Goal: Knowledge of [facility] Education information/materials will improve Outcome: Progressing Goal: Emotional status will improve Outcome: Progressing Goal: Mental status will improve Outcome: Progressing Goal: Verbalization of understanding the information provided will improve Outcome: Progressing   Problem: Safety: Goal: Periods of time without injury will increase Outcome: Progressing   Problem: Education: Goal: Ability to state activities that reduce stress will improve Outcome: Progressing   Problem: Self-Concept: Goal: Ability to identify factors that promote anxiety will improve Outcome: Progressing Goal: Level of anxiety will decrease Outcome: Progressing Goal: Ability to modify response to factors that promote anxiety will improve Outcome: Progressing

## 2020-04-03 MED ORDER — LEVOTHYROXINE SODIUM 88 MCG PO TABS: 88.0000 ug | ORAL_TABLET | Freq: Every day | ORAL | 1 refills | Status: DC

## 2020-04-03 MED ORDER — TRAZODONE HCL 100 MG PO TABS: 100.0000 mg | ORAL_TABLET | Freq: Every day | ORAL | 1 refills | Status: DC

## 2020-04-03 MED ORDER — PANTOPRAZOLE SODIUM 40 MG PO TBEC: 40.0000 mg | DELAYED_RELEASE_TABLET | Freq: Every day | ORAL | 1 refills | Status: DC

## 2020-04-03 MED ORDER — HYDROXYZINE HCL 25 MG PO TABS: 25.0000 mg | ORAL_TABLET | Freq: Three times a day (TID) | ORAL | 1 refills | Status: DC | PRN

## 2020-04-03 MED ORDER — FAMOTIDINE 20 MG PO TABS: 20.0000 mg | ORAL_TABLET | Freq: Two times a day (BID) | ORAL | 1 refills | Status: DC

## 2020-04-03 MED ORDER — OLANZAPINE 20 MG PO TBDP: 20.0000 mg | ORAL_TABLET | Freq: Every day | ORAL | 1 refills | Status: DC

## 2020-04-03 NOTE — BHH Group Notes (Signed)
[facility] Group Notes:  (Nursing/MHT/Case Management/Adjunct)  Date:  04/03/2020  Time:  9:25 AM  Type of Therapy:  Community Meeting  Participation Level:  Did Not Attend   Adela Lank [facility] 04/03/2020, 9:25 AM

## 2020-04-03 NOTE — Progress Notes (Signed)
Patient alert and oriented x 4, thoughts are organized and coherent, she denies SI/HI/AVH, affect is flat but brightens upon approach, she appears less anxious, she was Interacting with peers and staff appropriately Patient rated depression 4/10 ( 0 low - high10). Patient was complaint with medication regimen and she eat evening snack. No distress noted 15, minutes safety checks maintained, will continue to monitor.

## 2020-04-03 NOTE — BHH Suicide Risk Assessment (Signed)
[facility] Discharge Suicide Risk Assessment   Principal Problem: Bipolar 1 disorder ([place]) Discharge Diagnoses: Principal Problem:   Bipolar 1 disorder ([place]) Active Problems:   Hypothyroidism   Gastroesophageal reflux disease with hiatal hernia   Irritable bowel syndrome   Total Time spent with patient: 30 minutes  Musculoskeletal: Strength & Muscle Tone: within normal limits Gait & Station: normal Patient leans: N/A  Psychiatric Specialty Exam: Review of Systems  Constitutional: Negative.   HENT: Negative.   Eyes: Negative.   Respiratory: Negative.   Cardiovascular: Negative.   Gastrointestinal: Negative.   Musculoskeletal: Negative.   Skin: Negative.   Neurological: Negative.   Psychiatric/Behavioral: Negative.     Blood pressure 107/83, pulse 91, temperature 98.1 F (36.7 C), temperature source Oral, resp. rate 17, height 4\' 11"  (1.499 m), weight 38.1 kg, SpO2 97 %.Body mass index is 16.97 kg/m.  General Appearance: Casual  Eye Contact::  Fair  Speech:  Clear and [id number]  Volume:  Normal  Mood:  Euthymic  Affect:  Congruent  Thought Process:  Goal Directed  Orientation:  Full (Time, Place, and Person)  Thought Content:  Logical  Suicidal Thoughts:  No  Homicidal Thoughts:  No  Memory:  Immediate;   Fair Recent;   Fair Remote;   Fair  Judgement:  Fair  Insight:  Fair  Psychomotor Activity:  Normal  Concentration:  Fair  Recall:  [employer] of [place]  Language: Fair  Akathisia:  No  Handed:  Right  AIMS (if indicated):     Assets:  Communication Skills Desire for Improvement Housing Resilience Social Support  Sleep:  Number of Hours: 8.25  Cognition: WNL  ADL's:  Intact   Mental Status Per Nursing Assessment::   On Admission:  NA  Demographic Factors:  Caucasian and Living alone  Loss Factors: Financial problems/change in socioeconomic status  Historical Factors: NA  Risk Reduction Factors:   Positive social support and Positive  therapeutic relationship  Continued Clinical Symptoms:  Bipolar Disorder:   Mixed State  Cognitive Features That Contribute To Risk:  None    Suicide Risk:  Minimal: No identifiable suicidal ideation.  Patients presenting with no risk factors but with morbid ruminations; may be classified as minimal risk based on the severity of the depressive symptoms  Follow-up Information    [facility] up on 04/10/2020.   Why: You are scheduled to meet with Dr. Hassan Buckler on 04/10/20 at 9:40AM. This appointment will be in person. Please give 24 hour notice if you are unable to make it. Thank You! Contact information: [id number] [phone]           Plan Of Care/Follow-up recommendations:  Activity:  Activity as tolerated Diet:  Regular diet Other:  Follow-up with outpatient treatment through [facility].  Continue on current medication.  Continue to make sure you are taking care of your health and eating and hydrating appropriately.  Work with family on plans for safer living circumstances in the future.  Alethia Berthold, MD 04/03/2020, 9:50 AM

## 2020-04-03 NOTE — Discharge Summary (Signed)
Physician Discharge Summary Note  Patient:  Leslie Wong is an 64 y.o., female MRN:  [id number] DOB:  [birth year] Patient phone:  [phone] (home)  Patient address:   [place] [address],  Total Time spent with patient: 30 minutes  Date of Admission:  03/24/2020 Date of Discharge: 04/03/20  Reason for Admission:  64 year old woman with a history of bipolar disorder.  Came to the emergency room because of worsening health.  She says that for the past month she has been unable to eat unable to drink and unable to sleep.  She cannot really describe why it is that she is unable to do this except to say when she tries to drink she starts to burp uncontrollably.  She feels like her body has "dehydrated itself".  Mood is very anxious and very down and depressed and hopeless although she denies suicidal intent or plan.  Patient is scattered but denies hallucinations and does not seem delirious or particularly disorganized in a way she has been in the past.   Principal Problem: Bipolar 1 disorder [facility]) Discharge Diagnoses: Principal Problem:   Bipolar 1 disorder ([place]) Active Problems:   Hypothyroidism   Gastroesophageal reflux disease with hiatal hernia   Irritable bowel syndrome   Past Psychiatric History: Patient has had multiple hospitalizations with a diagnosis of bipolar disorder.  Complicated often by substance abuse.  Has had periods of delirious psychosis in the past but right now and on her last admission seems to be doing slightly better.  Has struggled with any kind of follow-up especially as she lives by herself.  Does have a past history of suicidality.  Past Medical History:  Past Medical History:  Diagnosis Date  . Back pain   . Cancer ([place])    skin  . Depression    BAD  . Gastroparesis   . GERD (gastroesophageal reflux disease)   . Hypothyroidism   . Insomnia   . Migraines   . Parkinson disease ([place])    per [place] [facility], dx'd 2019  .  Recurrent cold sores   . Shoulder bursitis   . Urge incontinence     Past Surgical History:  Procedure Laterality Date  . ABDOMINAL HYSTERECTOMY  1999   total  . APPENDECTOMY    . ESOPHAGOGASTRODUODENOSCOPY  01/2006   negative except small hiatal hernia  . ESOPHAGOGASTRODUODENOSCOPY  02/24/2015   See report  . LAPAROSCOPY     for endometriosis  . OVARIAN CYST REMOVAL  1990   unilateral  . TONSILLECTOMY AND ADENOIDECTOMY     Family History:  Family History  Problem Relation Age of Onset  . Hypertension Mother   . Arthritis Mother   . Diabetes Mother   . Stroke Mother   . Cancer Father        lung  . Colon cancer Neg Hx   . Breast cancer Neg Hx    Family Psychiatric  History: None reported Social History:  Social History   Substance and Sexual Activity  Alcohol Use No  . Alcohol/week: 0.0 standard drinks     Social History   Substance and Sexual Activity  Drug Use No    Social History   Socioeconomic History  . Marital status: Divorced    Spouse name: Not on file  . Number of children: Not on file  . Years of education: Not on file  . Highest education level: Not on file  Occupational History  . Not on file  Tobacco Use  . Smoking status: Current Some Day Smoker    Packs/day: 1.00    Years: 42.50    Pack years: 42.50    Types: Cigarettes  . Smokeless tobacco: Former Systems developer  . Tobacco comment: declined  Substance and Sexual Activity  . Alcohol use: No    Alcohol/week: 0.0 standard drinks  . Drug use: No  . Sexual activity: Never  Other Topics Concern  . Not on file  Social History Narrative   Lives alone.     Has a dog names Dixie   Social Determinants of Health   Financial Resource Strain:   . Difficulty of Paying Living Expenses:   Food Insecurity:   . Worried About Charity fundraiser in the Last Year:   . Arboriculturist in the Last Year:   Transportation Needs:   . Film/video editor (Medical):   Marland Kitchen Lack of Transportation  (Non-Medical):   Physical Activity:   . Days of Exercise per Week:   . Minutes of Exercise per Session:   Stress:   . Feeling of Stress :   Social Connections:   . Frequency of Communication with Friends and Family:   . Frequency of Social Gatherings with Friends and Family:   . Attends Religious Services:   . Active Member of Clubs or Organizations:   . Attends Archivist Meetings:   Marland Kitchen Marital Status:     Hospital Course:  Patient remained on the [facility] unit for 9 days. The patient stabilized on medication and therapy. Patient was discharged on Synthroid 88 mcg Daily, Zyprexa 20 mg QHS, Trazodone 100 mg QHS, Protonix 40 mg Daily. Patient has shown improvement with improved mood, affect, sleep, appetite, and interaction. Patient has attended group and participated. Patient has been seen in the day room interacting with peers and staff appropriately. Patient denies any SI/HI/AVH and contracts for safety. Patient agrees to follow up at [facility]. Patient is provided with prescriptions for their medications upon discharge.  Physical Findings: AIMS:  , ,  ,  ,    CIWA:    COWS:     Musculoskeletal: Strength & Muscle Tone: within normal limits Gait & Station: normal Patient leans: N/A  Psychiatric Specialty Exam: Physical Exam  Nursing note and vitals reviewed. Constitutional: She is oriented to person, place, and time. She appears well-developed and well-nourished.  Cardiovascular: Normal rate.  Respiratory: Effort normal.  Musculoskeletal:        General: Normal range of motion.  Neurological: She is alert and oriented to person, place, and time.  Skin: Skin is warm.    Review of Systems  Constitutional: Negative.   HENT: Negative.   Eyes: Negative.   Respiratory: Negative.   Cardiovascular: Negative.   Gastrointestinal: Negative.   Genitourinary: Negative.   Musculoskeletal: Negative.   Skin: Negative.   Neurological: Negative.    Psychiatric/Behavioral: Negative.     Blood pressure 107/83, pulse 91, temperature 98.1 F (36.7 C), temperature source Oral, resp. rate 17, height 4\' 11"  (1.499 m), weight 38.1 kg, SpO2 97 %.Body mass index is 16.97 kg/m.   General Appearance: Casual  Eye Contact::  Fair  Speech:  Clear and [id number]  Volume:  Normal  Mood:  Euthymic  Affect:  Congruent  Thought Process:  Goal Directed  Orientation:  Full (Time, Place, and Person)  Thought Content:  Logical  Suicidal Thoughts:  No  Homicidal Thoughts:  No  Memory:  Immediate;   Fair Recent;  Fair Remote;   Fair  Judgement:  Fair  Insight:  Fair  Psychomotor Activity:  Normal  Concentration:  Fair  Recall:  [place]  Language: Fair  Akathisia:  No  Handed:  Right  AIMS (if indicated):     Assets:  Communication Skills Desire for Improvement Housing Resilience Social Support  Sleep:  Number of Hours: 8.25  Cognition: WNL  ADL's:  Intact   Have you used any form of tobacco in the last 30 days? (Cigarettes, Smokeless Tobacco, Cigars, and/or Pipes): No  Has this patient used any form of tobacco in the last 30 days? (Cigarettes, Smokeless Tobacco, Cigars, and/or Pipes) Yes, No  Blood Alcohol level:  Lab Results  Component Value Date   ETH <10 03/24/2020   ETH <10 123XX123    Metabolic Disorder Labs:  Lab Results  Component Value Date   HGBA1C 5.4 10/13/2018   MPG 108.28 10/13/2018   MPG 111.15 08/29/2018   No results found for: PROLACTIN Lab Results  Component Value Date   CHOL 182 01/03/2020   TRIG 278 (H) 01/03/2020   HDL 33 (L) 01/03/2020   CHOLHDL 5.5 01/03/2020   VLDL 56 (H) 01/03/2020   LDLCALC 93 01/03/2020   LDLCALC 103 (H) 10/13/2018    See Psychiatric Specialty Exam and Suicide Risk Assessment completed by Attending Physician prior to discharge.  Discharge destination:  Home  Is patient on multiple antipsychotic therapies at discharge:  No   Has Patient had three  or more failed trials of antipsychotic monotherapy by history:  No  Recommended Plan for Multiple Antipsychotic Therapies: NA  Discharge Instructions    Diet - low sodium heart healthy   Complete by: As directed    Increase activity slowly   Complete by: As directed      Allergies as of 04/03/2020      Reactions   Doxepin Other (See Comments)   Pt reports "that made me have seizures"   Valproic Acid Shortness Of Breath   Aspirin    REACTION: UNSPECIFIED   Atorvastatin Other (See Comments)   Tongue swelling, legs cramping   Baclofen    REACTION: Throat swells up   Codeine    REACTION: UNSPECIFIED   Duloxetine Other (See Comments)   Vision loss, weight loss   Erythromycin    REACTION: UNSPECIFIED   Gabapentin    REACTION: throat swells up   Metoclopramide Hcl    REACTION: states "messed me up"   Nortriptyline    Other reaction(s): Other (See Comments) Vision issues   Nsaids Other (See Comments)   Stomach problems   Nucynta Er [tapentadol Hcl Er]    Intolerant, see note from 07/24/12.     Nucynta [tapentadol]    AMS   Olanzapine Nausea Only   Prednisone    GI intolance   Pregabalin    Seroquel [quetiapine Fumerate] Other (See Comments)   Loss control, felt like on another planet   Sulfa Antibiotics Nausea And Vomiting   Topamax Other (See Comments)   Blisters in mouth and tongue   Vicodin [hydrocodone-acetaminophen]    Nausea, thrush and constipation      Medication List    STOP taking these medications   ascorbic acid 500 MG tablet Commonly known as: VITAMIN C   buPROPion 75 MG tablet Commonly known as: WELLBUTRIN   cholecalciferol 25 MCG (1000 UNIT) tablet Commonly known as: VITAMIN D   lamoTRIgine 25 MG tablet Commonly known as: LAMICTAL   OLANZapine  20 MG tablet Commonly known as: ZYPREXA Replaced by: OLANZapine zydis 20 MG disintegrating tablet   propranolol 10 MG tablet Commonly known as: INDERAL   Senokot 8.6 MG tablet Generic drug:  senna   sucralfate 1 g tablet Commonly known as: Carafate   vitamin B-12 1000 MCG tablet Commonly known as: CYANOCOBALAMIN     TAKE these medications     Indication  albuterol 108 (90 Base) MCG/ACT inhaler Commonly known as: VENTOLIN HFA Inhale 2 puffs into the lungs every 6 (six) hours as needed for wheezing or shortness of breath.  Indication: Chronic Obstructive Lung Disease   famotidine 20 MG tablet Commonly known as: PEPCID Take 1 tablet (20 mg total) by mouth 2 (two) times daily.  Indication: Heartburn   hydrOXYzine 25 MG tablet Commonly known as: ATARAX/VISTARIL Take 1 tablet (25 mg total) by mouth 3 (three) times daily as needed for anxiety.  Indication: Feeling Anxious   levothyroxine 88 MCG tablet Commonly known as: SYNTHROID Take 1 tablet (88 mcg total) by mouth daily at 6 (six) AM. Start taking on: April 04, 2020 What changed: when to take this  Indication: Underactive Thyroid   OLANZapine zydis 20 MG disintegrating tablet Commonly known as: ZYPREXA Take 1 tablet (20 mg total) by mouth at bedtime. Replaces: OLANZapine 20 MG tablet  Indication: Manic Phase of Manic-Depression   pantoprazole 40 MG tablet Commonly known as: PROTONIX Take 1 tablet (40 mg total) by mouth daily. Start taking on: April 04, 2020  Indication: Gastroesophageal Reflux Disease   traZODone 100 MG tablet Commonly known as: DESYREL Take 1 tablet (100 mg total) by mouth at bedtime.  Indication: Trouble Sleeping   valACYclovir 500 MG tablet Commonly known as: VALTREX TAKE 1 TABLET(500 MG) BY MOUTH TWICE DAILY  Indication: Per PCP      Follow-up Information    [facility] up on 04/10/2020.   Why: You are scheduled to meet with Dr. Hassan Buckler on 04/10/20 at 9:40AM. This appointment will be in person. Please give 24 hour notice if you are unable to make it. Thank You! Contact information: [place] [address] [phone]            Follow-up recommendations:  Continue activity as tolerated. Continue diet as recommended by your PCP. Ensure to keep all appointments with outpatient providers.  Comments:  Patient is instructed prior to discharge to: Take all medications as prescribed by his/her mental healthcare provider. Report any adverse effects and or reactions from the medicines to his/her outpatient provider promptly. Patient has been instructed & cautioned: To not engage in alcohol and or illegal drug use while on prescription medicines. In the event of worsening symptoms, patient is instructed to call the crisis hotline, 911 and or go to the nearest ED for appropriate evaluation and treatment of symptoms. To follow-up with his/her primary care provider for your other medical issues, concerns and or health care needs.    Signed: Lowry Ram Brannon Decaire, FNP 04/03/2020, 10:25 AM

## 2020-04-03 NOTE — Progress Notes (Signed)
Recreation Therapy Notes  INPATIENT RECREATION TR PLAN  Patient Details Name: Leslie Wong MRN: [id number] DOB: 06-Sep-1956 Today's Date: 04/03/2020  Rec Therapy Plan Is patient appropriate for Therapeutic Recreation?: Yes Treatment times per week: at least 3 Estimated Length of Stay: 5-7 days TR Treatment/Interventions: Group participation (Comment)  Discharge Criteria Pt will be discharged from therapy if:: Discharged Treatment plan/goals/alternatives discussed and agreed upon by:: Patient/family  Discharge Summary Short term goals set: Patient will engage in groups without prompting or encouragement from LRT x3 group sessions within 5 recreation therapy group sessions Short term goals met: Complete Progress toward goals comments: Groups attended Which groups?: Communication, Leisure education, Anger management Reason goals not met: N/A Therapeutic equipment acquired: N/A Reason patient discharged from therapy: Discharge from hospital Pt/family agrees with progress & goals achieved: Yes Date patient discharged from therapy: 04/03/20   Estrella Alcaraz 04/03/2020, 1:36 PM

## 2020-04-03 NOTE — BHH Group Notes (Signed)
LCSW Group Therapy Note  04/03/2020 1:00 PM  Type of Therapy/Topic:  Group Therapy:  Emotion Regulation  Participation Level:  Did Not Attend   Description of Group:   The purpose of this group is to assist patients in learning to regulate negative emotions and experience positive emotions. Patients will be guided to discuss ways in which they have been vulnerable to their negative emotions. These vulnerabilities will be juxtaposed with experiences of positive emotions or situations, and patients will be challenged to use positive emotions to combat negative ones. Special emphasis will be placed on coping with negative emotions in conflict situations, and patients will process healthy conflict resolution skills.  Therapeutic Goals: 1. Patient will identify two positive emotions or experiences to reflect on in order to balance out negative emotions 2. Patient will label two or more emotions that they find the most difficult to experience 3. Patient will demonstrate positive conflict resolution skills through discussion and/or role plays  Summary of Patient Progress: X  Therapeutic Modalities:   Cognitive Behavioral Therapy Feelings Identification [place], MSW, LCSW 04/03/2020 2:45 PM

## 2020-04-03 NOTE — Progress Notes (Signed)
  [facility] Adult Case Management Discharge Plan :  Will you be returning to the same living situation after discharge:  Yes,  pt reports she is returning home.  At discharge, do you have transportation home?: Yes,  pt reports that her sister will provide transportation.  Do you have the ability to pay for your medications: Yes,  Medicare  Release of information consent forms completed and in the chart;  Patient's signature needed at discharge.  Patient to Follow up at: Follow-up Information    [facility] up on 04/10/2020.   Why: You are scheduled to meet with Dr. Hassan Buckler on 04/10/20 at 9:40AM. This appointment will be in person. Please give 24 hour notice if you are unable to make it. Thank You! Contact information: [place] [address] [phone]           Next level of care provider has access to [place] and Suicide Prevention discussed: Yes,  SPE completed with patient's daughter.   Have you used any form of tobacco in the last 30 days? (Cigarettes, Smokeless Tobacco, Cigars, and/or Pipes): No  Has patient been referred to the Quitline?: Patient refused referral  Patient has been referred for addiction treatment: Pt. refused referral  Rozann Lesches, LCSW 04/03/2020, 10:44 AM

## 2020-04-03 NOTE — BHH Group Notes (Signed)
[facility] Group Notes:  (Nursing/MHT/Case Management/Adjunct)  Date:  04/03/2020  Time:  11:34 AM  Type of Therapy:  Psychoeducational Skills  Participation Level:  Did Not Attend  Participation Quality:    Affect:    Cognitive:    Insight:    Engagement in Group:    Modes of Intervention:    Summary of Progress/Problems:  Lenwood Balsam 04/03/2020, 11:34 AM

## 2020-04-03 NOTE — Progress Notes (Signed)
Patient denies SI/HI, denies A/V hallucinations. Patient verbalizes understanding of discharge instructions, follow up care and prescriptions. Patient given all belongings from [facility] locker. Patient escorted out by staff, transported by family. 

## 2020-04-03 NOTE — Progress Notes (Signed)
Recreation Therapy Notes  Date: 04/03/2020  Time: 9:30 am  Location: Craft Room  Behavioral response: Appropriate   Intervention Topic: Communication    Discussion/Intervention:  Group content today was focused on communication. The group defined communication and ways to communicate with others. Individuals stated reason why communication is important and some reasons to communicate with others. Patients expressed if they thought they were good at communicating with others and ways they could improve their communication skills. The group identified important parts of communication and some experiences they have had in the past with communication. The group participated in the intervention "Words in a Bag", where they had a chance to test out their communication skills and identify ways to improve their communication techniques.  Clinical Observations/Feedback:  Patient came to group late due to unknown reasons. Individual was social with peers and staff while participating in the intervention during group.  Ariana Juul LRT/CTRS         Nitzia Perren 04/03/2020 11:40 AM

## 2020-05-04 ENCOUNTER — Telehealth: Payer: Self-pay

## 2020-05-04 DIAGNOSIS — R634 Abnormal weight loss: Secondary | ICD-10-CM | POA: Insufficient documentation

## 2020-05-04 DIAGNOSIS — G20 Parkinson's disease: Secondary | ICD-10-CM | POA: Diagnosis not present

## 2020-05-04 DIAGNOSIS — R110 Nausea: Secondary | ICD-10-CM | POA: Diagnosis not present

## 2020-05-04 DIAGNOSIS — R109 Unspecified abdominal pain: Secondary | ICD-10-CM | POA: Diagnosis not present

## 2020-05-04 DIAGNOSIS — R633 Feeding difficulties: Secondary | ICD-10-CM | POA: Diagnosis not present

## 2020-05-04 DIAGNOSIS — F319 Bipolar disorder, unspecified: Secondary | ICD-10-CM | POA: Diagnosis not present

## 2020-05-04 DIAGNOSIS — R102 Pelvic and perineal pain: Secondary | ICD-10-CM | POA: Diagnosis not present

## 2020-05-04 DIAGNOSIS — R627 Adult failure to thrive: Secondary | ICD-10-CM | POA: Diagnosis not present

## 2020-05-04 DIAGNOSIS — Z20822 Contact with and (suspected) exposure to covid-19: Secondary | ICD-10-CM | POA: Diagnosis not present

## 2020-05-04 DIAGNOSIS — E039 Hypothyroidism, unspecified: Secondary | ICD-10-CM | POA: Diagnosis not present

## 2020-05-04 DIAGNOSIS — F259 Schizoaffective disorder, unspecified: Secondary | ICD-10-CM | POA: Diagnosis not present

## 2020-05-04 DIAGNOSIS — E43 Unspecified severe protein-calorie malnutrition: Secondary | ICD-10-CM | POA: Diagnosis not present

## 2020-05-04 NOTE — Telephone Encounter (Signed)
[place] DPR signed said that for 4 days pt has not eaten or drank anything; pts daughter thinks pt weighs 80 lbs. Pt has told daughter she feels like she is dying. Pt has mouth dryness, lower abd pain, and is slow to respond like she is "spacey". Pt is not dizzy but does have generalized weakness, difficult for pt to stand or walk due to weakness; [name] said she is not taking pt back to [facility]. Pt temp is 97.9 now. Not sure what BP is. Pt stays cold feeling; daughter said it is 75 degrees in the house now and pt is cold. I spoke with Dr Damita Dunnings and advised [name] needs to take pt to ED for eval. [name] will take pt to [facility] in [place] when she gets someone to come and watch her kids today. FYI to Dr Damita Dunnings.

## 2020-05-05 DIAGNOSIS — G8929 Other chronic pain: Secondary | ICD-10-CM | POA: Diagnosis not present

## 2020-05-05 DIAGNOSIS — R110 Nausea: Secondary | ICD-10-CM | POA: Diagnosis not present

## 2020-05-05 DIAGNOSIS — R634 Abnormal weight loss: Secondary | ICD-10-CM | POA: Diagnosis not present

## 2020-05-05 DIAGNOSIS — E43 Unspecified severe protein-calorie malnutrition: Secondary | ICD-10-CM | POA: Diagnosis not present

## 2020-05-05 NOTE — Telephone Encounter (Signed)
Noted. Thanks.

## 2020-05-06 DIAGNOSIS — J9811 Atelectasis: Secondary | ICD-10-CM | POA: Diagnosis not present

## 2020-05-06 DIAGNOSIS — R1310 Dysphagia, unspecified: Secondary | ICD-10-CM | POA: Diagnosis present

## 2020-05-06 DIAGNOSIS — F259 Schizoaffective disorder, unspecified: Secondary | ICD-10-CM | POA: Diagnosis present

## 2020-05-06 DIAGNOSIS — R627 Adult failure to thrive: Secondary | ICD-10-CM | POA: Diagnosis present

## 2020-05-06 DIAGNOSIS — Z882 Allergy status to sulfonamides status: Secondary | ICD-10-CM | POA: Diagnosis not present

## 2020-05-06 DIAGNOSIS — R918 Other nonspecific abnormal finding of lung field: Secondary | ICD-10-CM | POA: Diagnosis not present

## 2020-05-06 DIAGNOSIS — G20 Parkinson's disease: Secondary | ICD-10-CM | POA: Diagnosis present

## 2020-05-06 DIAGNOSIS — Z87891 Personal history of nicotine dependence: Secondary | ICD-10-CM | POA: Diagnosis not present

## 2020-05-06 DIAGNOSIS — I081 Rheumatic disorders of both mitral and tricuspid valves: Secondary | ICD-10-CM | POA: Diagnosis not present

## 2020-05-06 DIAGNOSIS — F17210 Nicotine dependence, cigarettes, uncomplicated: Secondary | ICD-10-CM | POA: Diagnosis not present

## 2020-05-06 DIAGNOSIS — K228 Other specified diseases of esophagus: Secondary | ICD-10-CM | POA: Diagnosis not present

## 2020-05-06 DIAGNOSIS — R110 Nausea: Secondary | ICD-10-CM | POA: Diagnosis not present

## 2020-05-06 DIAGNOSIS — K219 Gastro-esophageal reflux disease without esophagitis: Secondary | ICD-10-CM | POA: Diagnosis present

## 2020-05-06 DIAGNOSIS — Z20822 Contact with and (suspected) exposure to covid-19: Secondary | ICD-10-CM | POA: Diagnosis present

## 2020-05-06 DIAGNOSIS — M797 Fibromyalgia: Secondary | ICD-10-CM | POA: Diagnosis present

## 2020-05-06 DIAGNOSIS — E039 Hypothyroidism, unspecified: Secondary | ICD-10-CM | POA: Diagnosis present

## 2020-05-06 DIAGNOSIS — R911 Solitary pulmonary nodule: Secondary | ICD-10-CM | POA: Diagnosis not present

## 2020-05-06 DIAGNOSIS — F319 Bipolar disorder, unspecified: Secondary | ICD-10-CM | POA: Diagnosis present

## 2020-05-06 DIAGNOSIS — E559 Vitamin D deficiency, unspecified: Secondary | ICD-10-CM | POA: Diagnosis not present

## 2020-05-06 DIAGNOSIS — K3184 Gastroparesis: Secondary | ICD-10-CM | POA: Diagnosis present

## 2020-05-06 DIAGNOSIS — F329 Major depressive disorder, single episode, unspecified: Secondary | ICD-10-CM | POA: Diagnosis not present

## 2020-05-06 DIAGNOSIS — Z8659 Personal history of other mental and behavioral disorders: Secondary | ICD-10-CM | POA: Diagnosis not present

## 2020-05-06 DIAGNOSIS — R638 Other symptoms and signs concerning food and fluid intake: Secondary | ICD-10-CM | POA: Diagnosis not present

## 2020-05-06 DIAGNOSIS — R634 Abnormal weight loss: Secondary | ICD-10-CM | POA: Diagnosis present

## 2020-05-06 DIAGNOSIS — R6881 Early satiety: Secondary | ICD-10-CM | POA: Diagnosis not present

## 2020-05-06 DIAGNOSIS — Z8739 Personal history of other diseases of the musculoskeletal system and connective tissue: Secondary | ICD-10-CM | POA: Diagnosis not present

## 2020-05-06 DIAGNOSIS — E860 Dehydration: Secondary | ICD-10-CM | POA: Diagnosis present

## 2020-05-06 DIAGNOSIS — G8929 Other chronic pain: Secondary | ICD-10-CM | POA: Diagnosis not present

## 2020-05-06 DIAGNOSIS — K224 Dyskinesia of esophagus: Secondary | ICD-10-CM | POA: Diagnosis present

## 2020-05-06 DIAGNOSIS — F3289 Other specified depressive episodes: Secondary | ICD-10-CM | POA: Diagnosis not present

## 2020-05-06 DIAGNOSIS — E43 Unspecified severe protein-calorie malnutrition: Secondary | ICD-10-CM | POA: Diagnosis present

## 2020-05-06 DIAGNOSIS — M6281 Muscle weakness (generalized): Secondary | ICD-10-CM | POA: Diagnosis not present

## 2020-05-06 DIAGNOSIS — E46 Unspecified protein-calorie malnutrition: Secondary | ICD-10-CM | POA: Diagnosis not present

## 2020-05-06 DIAGNOSIS — M549 Dorsalgia, unspecified: Secondary | ICD-10-CM | POA: Diagnosis not present

## 2020-05-13 MED ORDER — BIOTENE DRY MOUTH MOISTURIZING MT SOLN
1.00 | OROMUCOSAL | Status: DC
Start: ? — End: 2020-05-13

## 2020-05-13 MED ORDER — THIAMINE HCL 100 MG PO TABS
200.00 | ORAL_TABLET | ORAL | Status: DC
Start: 2020-05-19 — End: 2020-05-13

## 2020-05-13 MED ORDER — THERA-M PO TABS
1.00 | ORAL_TABLET | ORAL | Status: DC
Start: 2020-05-19 — End: 2020-05-13

## 2020-05-13 MED ORDER — PANTOPRAZOLE SODIUM 40 MG PO TBEC
40.00 | DELAYED_RELEASE_TABLET | ORAL | Status: DC
Start: 2020-05-14 — End: 2020-05-13

## 2020-05-13 MED ORDER — GENERIC EXTERNAL MEDICATION
Status: DC
Start: ? — End: 2020-05-13

## 2020-05-13 MED ORDER — ACETAMINOPHEN 325 MG PO TABS
650.00 | ORAL_TABLET | ORAL | Status: DC
Start: ? — End: 2020-05-13

## 2020-05-13 MED ORDER — FOLIC ACID 1 MG PO TABS
1.00 | ORAL_TABLET | ORAL | Status: DC
Start: 2020-05-19 — End: 2020-05-13

## 2020-05-13 MED ORDER — SUCRALFATE 1 GM/10ML PO SUSP
1.00 | ORAL | Status: DC
Start: 2020-05-18 — End: 2020-05-13

## 2020-05-13 MED ORDER — LIDOCAINE 5 % EX PTCH
2.00 | MEDICATED_PATCH | CUTANEOUS | Status: DC
Start: 2020-05-13 — End: 2020-05-13

## 2020-05-13 MED ORDER — MELATONIN 3 MG PO TABS
3.00 | ORAL_TABLET | ORAL | Status: DC
Start: ? — End: 2020-05-13

## 2020-05-13 MED ORDER — OLANZAPINE 5 MG PO TBDP
15.00 | ORAL_TABLET | ORAL | Status: DC
Start: 2020-05-18 — End: 2020-05-13

## 2020-05-13 MED ORDER — DIPHENHYDRAMINE HCL 25 MG PO CAPS
25.00 | ORAL_CAPSULE | ORAL | Status: DC
Start: ? — End: 2020-05-13

## 2020-05-13 MED ORDER — ALUM & MAG HYDROXIDE-SIMETH 400-400-40 MG/5ML PO SUSP
30.00 | ORAL | Status: DC
Start: ? — End: 2020-05-13

## 2020-05-13 MED ORDER — TRAZODONE HCL 50 MG PO TABS
50.00 | ORAL_TABLET | ORAL | Status: DC
Start: ? — End: 2020-05-13

## 2020-05-13 MED ORDER — LEVOTHYROXINE SODIUM 88 MCG PO TABS
88.00 | ORAL_TABLET | ORAL | Status: DC
Start: 2020-05-19 — End: 2020-05-13

## 2020-05-13 MED ORDER — GUAIFENESIN 100 MG/5ML PO SYRP
200.00 | ORAL_SOLUTION | ORAL | Status: DC
Start: ? — End: 2020-05-13

## 2020-05-13 MED ORDER — DERMACERIN EX CREA
1.00 | TOPICAL_CREAM | CUTANEOUS | Status: DC
Start: ? — End: 2020-05-13

## 2020-05-13 MED ORDER — HYDROXYZINE HCL 10 MG PO TABS
10.00 | ORAL_TABLET | ORAL | Status: DC
Start: ? — End: 2020-05-13

## 2020-05-13 MED ORDER — ACETAMINOPHEN 650 MG RE SUPP
650.00 | RECTAL | Status: DC
Start: ? — End: 2020-05-13

## 2020-05-13 MED ORDER — MELATONIN 3 MG PO TABS
3.00 | ORAL_TABLET | ORAL | Status: DC
Start: 2020-05-18 — End: 2020-05-13

## 2020-05-13 MED ORDER — CHOLECALCIFEROL 25 MCG (1000 UT) PO TABS
25.00 | ORAL_TABLET | ORAL | Status: DC
Start: 2020-05-14 — End: 2020-05-13

## 2020-05-14 ENCOUNTER — Inpatient Hospital Stay: Payer: Medicare Other | Admitting: Family Medicine

## 2020-05-15 MED ORDER — GENERIC EXTERNAL MEDICATION
Status: DC
Start: ? — End: 2020-05-15

## 2020-05-18 ENCOUNTER — Telehealth: Payer: Self-pay | Admitting: Family Medicine

## 2020-05-18 MED ORDER — GENERIC EXTERNAL MEDICATION
Status: DC
Start: ? — End: 2020-05-18

## 2020-05-18 NOTE — Telephone Encounter (Signed)
error 

## 2020-05-20 DIAGNOSIS — M25561 Pain in right knee: Secondary | ICD-10-CM | POA: Diagnosis not present

## 2020-05-20 DIAGNOSIS — M5136 Other intervertebral disc degeneration, lumbar region: Secondary | ICD-10-CM | POA: Diagnosis not present

## 2020-05-20 DIAGNOSIS — M545 Low back pain: Secondary | ICD-10-CM | POA: Diagnosis not present

## 2020-05-20 DIAGNOSIS — M1288 Other specific arthropathies, not elsewhere classified, other specified site: Secondary | ICD-10-CM | POA: Diagnosis not present

## 2020-05-20 MED ORDER — GENERIC EXTERNAL MEDICATION
Status: DC
Start: ? — End: 2020-05-20

## 2020-05-26 ENCOUNTER — Other Ambulatory Visit: Payer: Self-pay | Admitting: *Deleted

## 2020-05-26 NOTE — Addendum Note (Signed)
Addended by: Harless Litten on: 05/26/2020 03:48 PM   Modules accepted: Orders

## 2020-05-26 NOTE — Patient Outreach (Addendum)
Member screened for potential [facility] needs as a benefit of [employer] ACO.  Per Patient Leslie Wong member has had multiple ED visits/hospitalizations.   Telephone call made to [name] at [phone] to discuss [facility] services. No answer. HIPAA compliant voicemail message left requesting return call. Telephone call made to daughter/DPR [phone]. No answer. Unable to leave voicemail message. Mailbox full.  Has history of recent unexplained weight loss, failure to thrive, bipolar, Parkinson's, HLD.  Will make referral to [place]. Most recent hospitalization 5/10 - 05/18/20 at [facility] per Patient [name].     Marthenia Rolling, MSN-Ed, RN,BSN [place] Acute Care Coordinator [phone] [facility]) [phone]  (Toll free office)

## 2020-05-28 ENCOUNTER — Other Ambulatory Visit: Payer: Self-pay | Admitting: *Deleted

## 2020-05-28 ENCOUNTER — Encounter: Payer: Self-pay | Admitting: *Deleted

## 2020-05-28 NOTE — Patient Outreach (Signed)
[facility]) Care Management [facility] CM Telephone Outreach, new referral PCP office completes Transition of Care follow up post-hospital discharge Post-hospital discharge day # 10 05/28/2020  Elesia Norrick 1956/11/12 [id number]  Unsuccessful telephone outreach to [name], 64 y/o female referred to [place] yesterday by [facility] RN [facility] after patient experienced inpatient hospitalization May 10-24, 2021 at [facility] for failure to thrive and unexplained weight loss.  Patient has had multiple recent ED and hospital admissions.  Patient has history including, but not limited to, bipolar depression with psychoses; hypothyroidism; GERD; HLD; possible parkinson's disease.  HIPAA compliant voice mail message left for patient, requesting return call back.  Plan:  Will place [facility] CM unsuccessful patient outreach letter in mail requesting call back in writing  Will re-attempt [facility] CM telephone outreach within 4 business days if I do not hear back from patient first  Oneta Rack, RN, BSN, [employer] Group Coordinator [facility] Care Management  [phone]

## 2020-05-29 ENCOUNTER — Encounter: Payer: Self-pay | Admitting: Family Medicine

## 2020-05-29 ENCOUNTER — Other Ambulatory Visit: Payer: Self-pay

## 2020-05-29 ENCOUNTER — Ambulatory Visit (INDEPENDENT_AMBULATORY_CARE_PROVIDER_SITE_OTHER): Payer: Medicare Other | Admitting: Family Medicine

## 2020-05-29 VITALS — BP 102/64 | HR 96 | Temp 96.4°F | Ht 59.0 in | Wt 90.2 lb

## 2020-05-29 DIAGNOSIS — R911 Solitary pulmonary nodule: Secondary | ICD-10-CM

## 2020-05-29 DIAGNOSIS — K3184 Gastroparesis: Secondary | ICD-10-CM

## 2020-05-29 DIAGNOSIS — F3163 Bipolar disorder, current episode mixed, severe, without psychotic features: Secondary | ICD-10-CM

## 2020-05-29 LAB — COMPREHENSIVE METABOLIC PANEL
ALT: 12 U/L (ref 0–35)
AST: 18 U/L (ref 0–37)
Albumin: 4.6 g/dL (ref 3.5–5.2)
Alkaline Phosphatase: 73 U/L (ref 39–117)
BUN: 17 mg/dL (ref 6–23)
CO2: 32 mEq/L (ref 19–32)
Calcium: 11 mg/dL — ABNORMAL HIGH (ref 8.4–10.5)
Chloride: 102 mEq/L (ref 96–112)
Creatinine, Ser: 0.71 mg/dL (ref 0.40–1.20)
GFR: 83.55 mL/min (ref >60.00)
Glucose, Bld: 111 mg/dL — ABNORMAL HIGH (ref 70–99)
Potassium: 5.1 mEq/L (ref 3.5–5.1)
Sodium: 138 mEq/L (ref 135–145)
Total Bilirubin: 0.3 mg/dL (ref 0.2–1.2)
Total Protein: 7.3 g/dL (ref 6.0–8.3)

## 2020-05-29 LAB — CBC WITH DIFFERENTIAL/PLATELET
Basophils Absolute: 0.1 10*3/uL (ref 0.0–0.1)
Basophils Relative: 0.5 % (ref 0.0–3.0)
Eosinophils Absolute: 0.2 10*3/uL (ref 0.0–0.7)
Eosinophils Relative: 1.9 % (ref 0.0–5.0)
HCT: 42.2 % (ref 36.0–46.0)
Hemoglobin: 14.2 g/dL (ref 12.0–15.0)
Lymphocytes Relative: 21.7 % (ref 12.0–46.0)
Lymphs Abs: 2.6 10*3/uL (ref 0.7–4.0)
MCHC: 33.6 g/dL (ref 30.0–36.0)
MCV: 96.6 fl (ref 78.0–100.0)
Monocytes Absolute: 0.8 10*3/uL (ref 0.1–1.0)
Monocytes Relative: 6.5 % (ref 3.0–12.0)
Neutro Abs: 8.5 10*3/uL — ABNORMAL HIGH (ref 1.4–7.7)
Neutrophils Relative %: 69.4 % (ref 43.0–77.0)
Platelets: 296 10*3/uL (ref 150.0–400.0)
RBC: 4.36 Mil/uL (ref 3.87–5.11)
RDW: 15.4 % (ref 11.5–15.5)
WBC: 12.2 10*3/uL — ABNORMAL HIGH (ref 4.0–10.5)

## 2020-05-29 LAB — MAGNESIUM: Magnesium: 2.1 mg/dL (ref 1.5–2.5)

## 2020-05-29 MED ORDER — OLANZAPINE 15 MG PO TBDP: 15.0000 mg | ORAL_TABLET | Freq: Every day | ORAL | 0 refills | Status: DC

## 2020-05-29 MED ORDER — LEVOTHYROXINE SODIUM 88 MCG PO TABS: 88.0000 ug | ORAL_TABLET | Freq: Every day | ORAL | 1 refills | Status: DC

## 2020-05-29 MED ORDER — VALACYCLOVIR HCL 500 MG PO TABS: ORAL_TABLET | ORAL | Status: DC

## 2020-05-29 MED ORDER — OMEPRAZOLE 20 MG PO CPDR
20.0000 mg | DELAYED_RELEASE_CAPSULE | Freq: Two times a day (BID) | ORAL | 1 refills | Status: DC
Start: 2020-05-29 — End: 2020-12-29

## 2020-05-29 MED ORDER — OLANZAPINE 15 MG PO TBDP
15.0000 mg | ORAL_TABLET | Freq: Every day | ORAL | Status: DC
Start: 2020-05-29 — End: 2020-05-29

## 2020-05-29 MED ORDER — TRAZODONE HCL 100 MG PO TABS: 100.0000 mg | ORAL_TABLET | Freq: Every day | ORAL | 0 refills | Status: DC

## 2020-05-29 NOTE — Patient Instructions (Signed)
Let me see about getting you set up with psychiatry and GI.  The trazodone and zyprexa will need to come through psychiatry in the long run.  I need GI input on marinol use.  Update me as needed.  Take care.  Glad to see you.  Go to the lab on the way out.   If you have mychart we'll likely use that to update you.

## 2020-05-29 NOTE — Progress Notes (Signed)
This visit occurred during the SARS-CoV-2 public health emergency.  Safety protocols were in place, including screening questions prior to the visit, additional usage of staff PPE, and extensive cleaning of exam room while observing appropriate contact time as indicated for disinfecting solutions.  Inpatient follow.    CT chest d/w pt.  Needs f/u CT chest in 3 months.  Incidental pulmonary nodules noted.  Discussed pulmonary nodules in general and the procedure for follow-up.  Weight loss d/w pt. admitted with weight loss and gastroparesis.  She has regained some weight.  Due for follow-up labs.  Inpatient course discussed with patient.  She is taking smaller meals more frequently and that is helping.  D/w pt about referral to GI for colonoscopy and for gastroparesis.  She was asking if Marinol would be useful but I need GI input on this.  She cannot tolerate Reglan.  Weight 76--->90 lbs.  No abd pain.    Social situation d/w pt. living at home.  Psych meds d/w pt.   See med list, updated.  She is off trazodone but it helped with sleep prev.  Refill done.  Compliant with medications at this point. She is okay with seeing [place] in the future.  Living alone at her apartment.  Staying with daughter some and sister lives nearby.  Using microwave at home to cook.  Mood is "okay".  No suicidal homicidal intent.  Daughter is dealing with custody and pending divorce.    covid vaccine encouraged, declined by patient.  Meds, vitals, and allergies reviewed.   ROS: Per HPI unless specifically indicated in ROS section   GEN: nad, alert and oriented, thin woman who appears stated age 64: ncat NECK: supple w/o LA CV: rrr.  PULM: ctab, no inc wob ABD: soft, +bs EXT: no edema SKIN: no acute rash Speech normal.  No tremor.  Judgment appears intact.  See notes on follow-up labs.

## 2020-06-01 DIAGNOSIS — R911 Solitary pulmonary nodule: Secondary | ICD-10-CM | POA: Insufficient documentation

## 2020-06-01 NOTE — Assessment & Plan Note (Signed)
Refer to GI for possible treatment.  I did not prescribe Marinol.  I will defer to GI about possible colonoscopy.  EGD was done as inpatient.

## 2020-06-01 NOTE — Assessment & Plan Note (Signed)
Scattered 2 to 3 mm nodules, no emergent issues.  Reasonable to get CT repeated in 3 months.

## 2020-06-01 NOTE — Assessment & Plan Note (Signed)
Refer back to psychiatry.  Restart trazodone for sleep at night.  Continue Zyprexa.  She has family support.  I need input from psychiatry clinic on long-term medication management.  At least 40 minutes were devoted to patient care in this encounter (this can potentially include time spent reviewing the patient's file/history, interviewing and examining the patient, counseling/reviewing plan with patient, ordering referrals, ordering tests, reviewing relevant laboratory or x-ray data, and documenting the encounter).

## 2020-06-02 ENCOUNTER — Other Ambulatory Visit: Payer: Self-pay | Admitting: *Deleted

## 2020-06-02 ENCOUNTER — Encounter: Payer: Self-pay | Admitting: *Deleted

## 2020-06-02 ENCOUNTER — Encounter: Payer: Self-pay | Admitting: Family Medicine

## 2020-06-02 NOTE — Patient Outreach (Signed)
[place] [facility]) Care Management [facility] CM Telephone Outreach PCP completes Transition of Care follow up post-hospital discharge Post-hospital discharge day # 15 Unsuccessful (consecutive) outreach attempt # 2  06/02/2020  Freedom Lopezperez [birth year] [id number]  Unsuccessful second attempt telephone outreach to [name], 64 y/o female referred to [facility] yesterday by [facility] RN [facility] after patient experienced inpatient hospitalization May 10-24, 2021 at [facility] for failure to thrive and unexplained weight loss.  Patient has had multiple recent ED and hospital admissions.  Patient has history including, but not limited to, bipolar depression with psychoses; hypothyroidism; GERD; HLD; possible parkinson's disease.  HIPAA compliant voice mail message left for patient, requesting return call back.  Plan:  Verified [facility] CM unsuccessful patient outreach letter in mail requesting call back in writing on May 28, 2020  Will re-attempt [facility] CM telephone outreach within 4 business days if I do not hear back from patient first  Oneta Rack, RN, BSN, [employer] Coordinator [facility] Care Management  [phone]

## 2020-06-04 ENCOUNTER — Encounter: Payer: Self-pay | Admitting: *Deleted

## 2020-06-04 ENCOUNTER — Other Ambulatory Visit: Payer: Self-pay | Admitting: *Deleted

## 2020-06-04 NOTE — Patient Outreach (Signed)
[place] [facility]) Care Management [facility] CM Telephone Outreach x 2- patient and caregiver on [place] PCP office completes Transition of Care follow up post-hospital discharge Post-hospital discharge day # 17 Unsuccessful (consecutive) third outreach attempt- new patient  06/04/2020  Leslie Wong 1956/04/23 [id number]  Unsuccessful consecutive third attempt/ telephone outreach to Leslie Wong, 64 y/o female referred to [place] yesterday by [facility] RN [facility] after patient experienced inpatient hospitalization May 10-24, 2021at [facility] for failure to thrive and unexplained weight loss. Patient has had multiple recent ED and hospital admissions.Patient has history including, but not limited to, bipolar depression with psychoses; hypothyroidism; GERD; HLD; possible parkinson's disease.  HIPAA compliant voice mail message left for patient, requesting return call back.  Attempted additional outreach attempt today to patient's daughter/ caregiver, on [place]; received automated outgoing voice message stating that this number has a full voice mail box; unable to leave caregiver voice message requesting call back  Plan:  Verified [facility] CM unsuccessful patient outreach letter in mail requesting call back in writing on May 28, 2020  Will re-attempt final [facility] CM telephone outreach in 3 weeksif I do not hear back from patient/ caregiver first  Oneta Rack, RN, BSN, [place] Care Management  [phone]

## 2020-06-23 DIAGNOSIS — Z79891 Long term (current) use of opiate analgesic: Secondary | ICD-10-CM | POA: Diagnosis not present

## 2020-06-23 DIAGNOSIS — Z79899 Other long term (current) drug therapy: Secondary | ICD-10-CM | POA: Diagnosis not present

## 2020-06-25 ENCOUNTER — Other Ambulatory Visit: Payer: Self-pay | Admitting: *Deleted

## 2020-06-25 NOTE — Patient Outreach (Signed)
[place] [facility]) Care Management  06/25/2020  [name] 10-16-1956 [id number]  Unsuccessful consecutivefourth outreach attempt/telephone call to Leslie Wong, 64 y/o female referred to [place] CM 05/27/20 by [facility] RN PAC after patient experienced inpatient hospitalization May 10-24, 2021at [facility] for failure to thrive and unexplained weight loss. Patient has had multiple recent ED and hospital admissions.Patient has history including, but not limited to, bipolar depression with psychoses; hypothyroidism; GERD; HLD; possible parkinson's disease.  HIPAA compliant voice mail message left for patient, requesting return call back.  Plan:  Verified[facility] CM unsuccessful patient outreach letter in mail requesting call back in writingon May 28, 2020  Will make patient inactive with [facility] CM as this is the fourth consecutive outreach attempt over 3 weeks and will make patient's PCP aware of same  Oneta Rack, RN, BSN, [place] Coordinator [facility] Care Management  [phone]

## 2020-06-26 ENCOUNTER — Telehealth: Payer: Self-pay | Admitting: *Deleted

## 2020-06-26 NOTE — Telephone Encounter (Signed)
Changing to 40mg  daily is reasonable.  Assuming patient does well, then I would go with that.  Please update patient and make the change, assuming she agrees (omeprazole 40mg  PO daily, #90, 1 RF).  Thanks.

## 2020-06-26 NOTE — Telephone Encounter (Signed)
Spoke with Celestia Khat (daughter) with new instructions.

## 2020-06-26 NOTE — Telephone Encounter (Signed)
Pharmacy is requesting PA for Omeprazole 20 mg BID.  Patient has Medicaid and this medication is on their formulary but there is a quantity limit of 1 per day.  Is there a reason for BID dosing or can the patient be switched to 40 mg qd that will likely be covered?

## 2020-07-28 ENCOUNTER — Telehealth: Payer: Self-pay | Admitting: *Deleted

## 2020-07-28 NOTE — Telephone Encounter (Signed)
Left message for patient to notify them that it is time to schedule annual low dose lung cancer screening CT scan. Instructed patient to call back to verify information prior to the scan being scheduled.  

## 2020-08-04 ENCOUNTER — Telehealth: Payer: Self-pay | Admitting: *Deleted

## 2020-08-04 NOTE — Telephone Encounter (Signed)
(  08/04/2020) Left message for pt to notify them that it is time to schedule annual low dose lung cancer screening CT scan. Instructed patient to call back to verify information prior to the scan being scheduled SRW     

## 2020-08-18 DIAGNOSIS — G894 Chronic pain syndrome: Secondary | ICD-10-CM | POA: Diagnosis not present

## 2020-08-18 DIAGNOSIS — Z79891 Long term (current) use of opiate analgesic: Secondary | ICD-10-CM | POA: Diagnosis not present

## 2020-08-18 DIAGNOSIS — M5412 Radiculopathy, cervical region: Secondary | ICD-10-CM | POA: Diagnosis not present

## 2020-08-18 DIAGNOSIS — M542 Cervicalgia: Secondary | ICD-10-CM | POA: Diagnosis not present

## 2020-09-06 IMAGING — MG DIGITAL DIAGNOSTIC UNILATERAL RIGHT MAMMOGRAM WITH TOMO AND CAD
4 series · 4 of 12 positions shown · non-contrast
Comparison: Previous exam(s).

CLINICAL DATA: Patient was called back from screening mammogram for
a possible mass in the right breast.

EXAM:
DIGITAL DIAGNOSTIC RIGHT MAMMOGRAM WITH TOMO
ULTRASOUND RIGHT BREAST

[R MLO synth-2D]
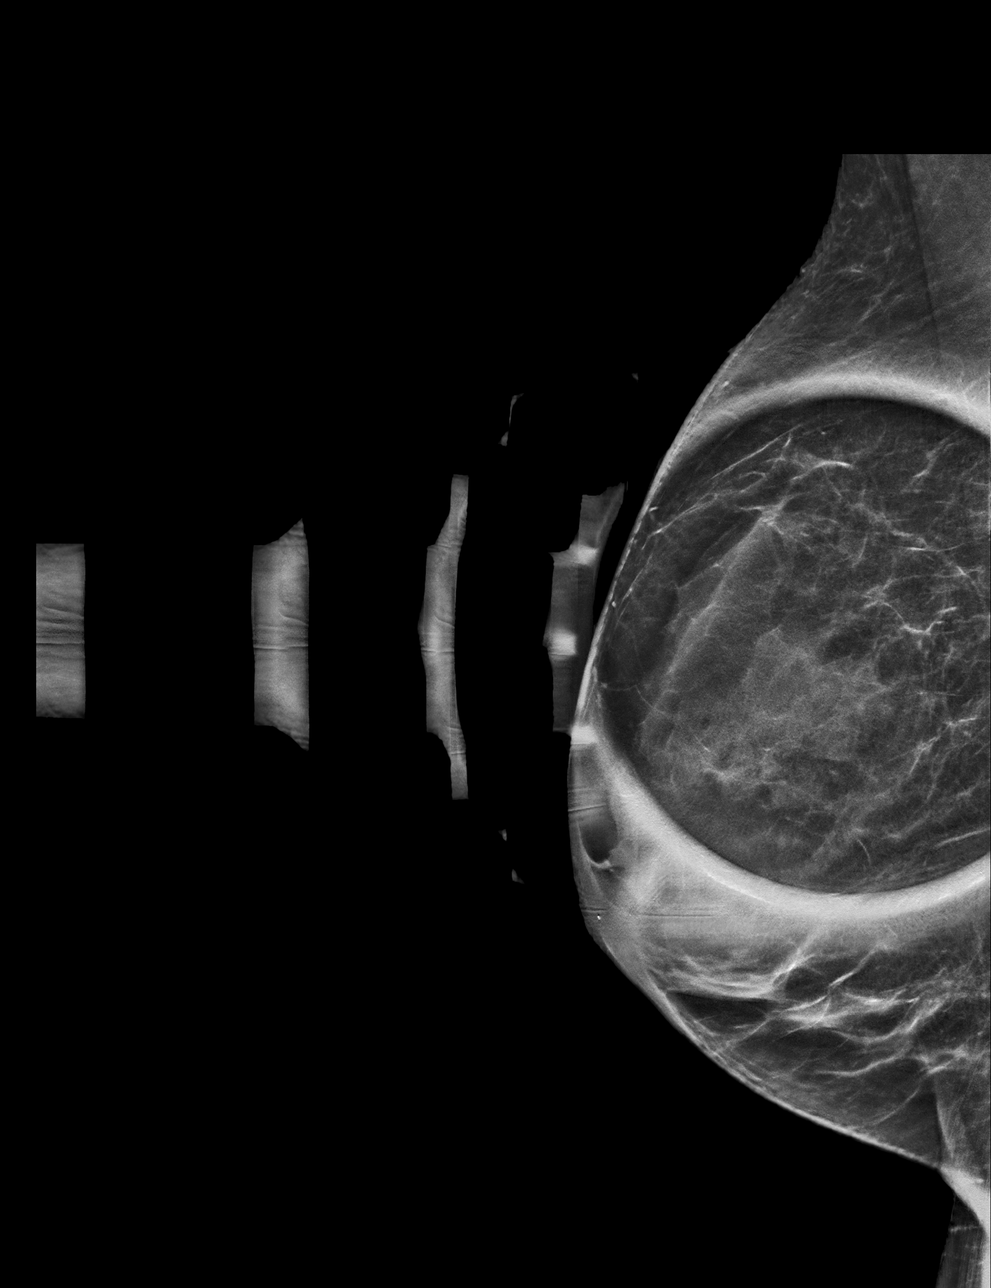

[R CC synth-2D]
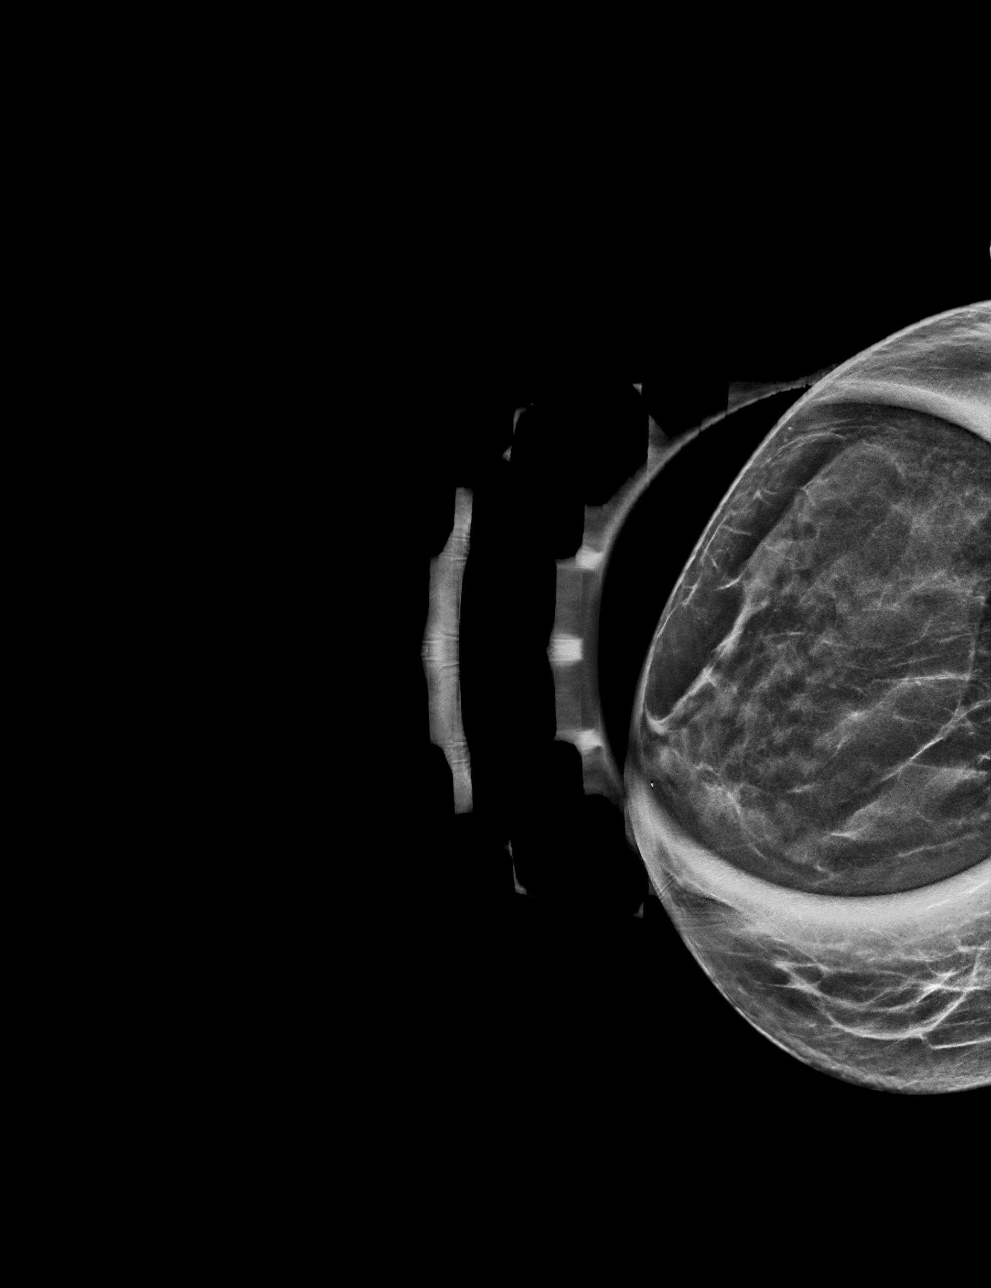

[R CC tomo · tomo slice 23/46.0]
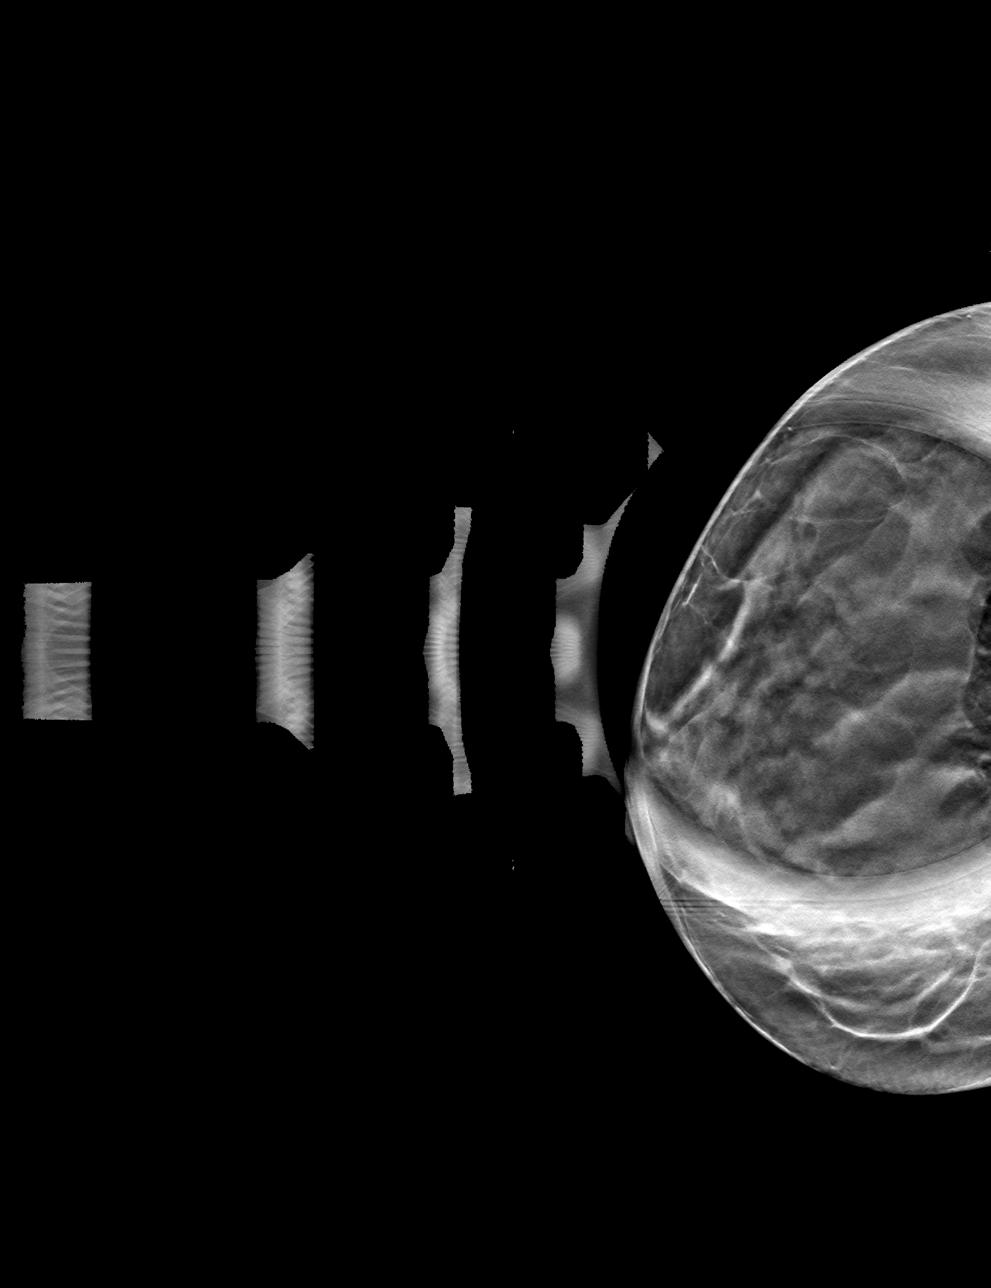

[R MLO tomo · tomo slice 25/48.0]
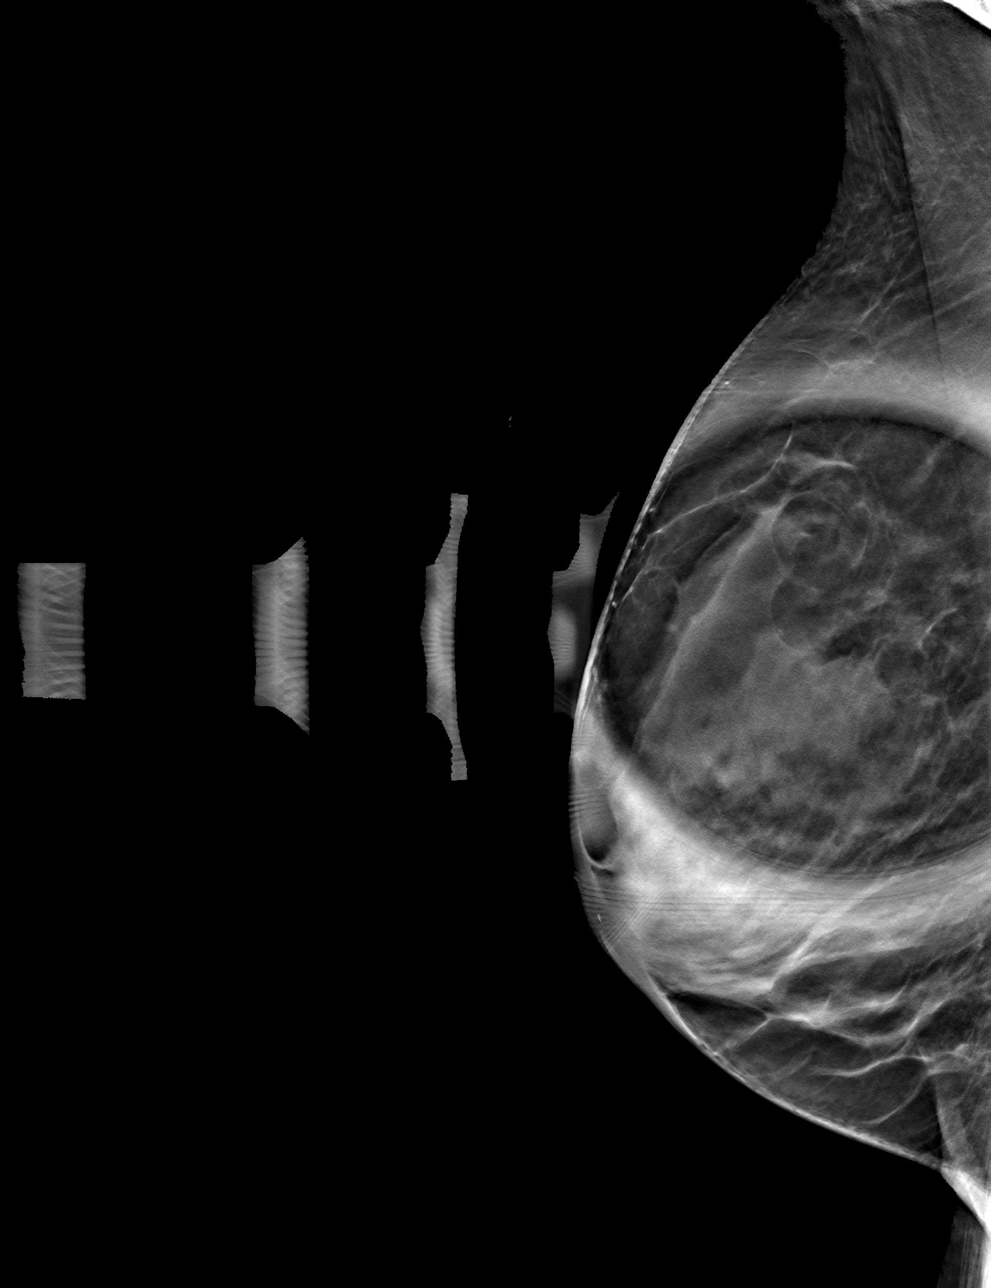

[4 of 12 positions shown; findings below may reference images not displayed]

ACR Breast Density Category d: The breast tissue is extremely dense,
which lowers the sensitivity of mammography.
FINDINGS: Additional imaging of the right breast was performed. No persistent
mass, distortion or malignant type microcalcifications identified.

Targeted ultrasound is performed, showing normal tissue in the
upper-outer quadrant of the right breast. No solid or cystic mass,
abnormal shadowing or distortion visualized.
IMPRESSION: No evidence of malignancy in the right breast.

RECOMMENDATION:
Bilateral screening mammogram in Monday February, 2020 is recommended.

I have discussed the findings and recommendations with the patient.
Results were also provided in writing at the conclusion of the
visit. If applicable, a reminder letter will be sent to the patient
regarding the next appointment.

BI-RADS CATEGORY  1: Negative.

## 2020-09-17 ENCOUNTER — Other Ambulatory Visit: Payer: Self-pay | Admitting: Family Medicine

## 2020-09-17 NOTE — Telephone Encounter (Signed)
Electronic refill request. Zyprexa Last office visit:   05/29/2020 Last Filled:     90 tablet 0 05/29/2020

## 2020-09-18 NOTE — Telephone Encounter (Signed)
Please check with patient/daughter.  The follow-up prescription needs to come through psychiatry.  If for some reason she needs a short-term refill to get her through to an appointment with psychiatry then I can work on that.  Please let me know.  Thanks.

## 2020-10-01 DIAGNOSIS — M5412 Radiculopathy, cervical region: Secondary | ICD-10-CM | POA: Diagnosis not present

## 2020-10-01 DIAGNOSIS — M542 Cervicalgia: Secondary | ICD-10-CM | POA: Diagnosis not present

## 2020-10-01 DIAGNOSIS — M5416 Radiculopathy, lumbar region: Secondary | ICD-10-CM | POA: Diagnosis not present

## 2020-10-01 DIAGNOSIS — Z79899 Other long term (current) drug therapy: Secondary | ICD-10-CM | POA: Diagnosis not present

## 2020-10-01 DIAGNOSIS — Z79891 Long term (current) use of opiate analgesic: Secondary | ICD-10-CM | POA: Diagnosis not present

## 2020-10-01 DIAGNOSIS — G894 Chronic pain syndrome: Secondary | ICD-10-CM | POA: Diagnosis not present

## 2020-10-12 ENCOUNTER — Telehealth: Payer: Self-pay | Admitting: Family Medicine

## 2020-10-12 NOTE — Telephone Encounter (Signed)
-----   Message from Josetta Huddle, [place] sent at 10/09/2020  4:18 PM EDT ----- No answer on multiple tries, unable to leave message as recording says "mailbox is full".  Called daughter's number and recording says the number is no longer in service. ----- Message ----- From: Tonia Ghent, MD Sent: 10/07/2020   1:44 PM EDT To: Josetta Huddle, CMA  Please try to contact patient and advise her to call about scheduling follow-up chest CT.  Thanks.

## 2020-10-12 NOTE — Telephone Encounter (Signed)
Thank you for trying to contact patient.

## 2020-10-29 DIAGNOSIS — G894 Chronic pain syndrome: Secondary | ICD-10-CM | POA: Diagnosis not present

## 2020-10-29 DIAGNOSIS — M5412 Radiculopathy, cervical region: Secondary | ICD-10-CM | POA: Diagnosis not present

## 2020-10-29 DIAGNOSIS — Z79891 Long term (current) use of opiate analgesic: Secondary | ICD-10-CM | POA: Diagnosis not present

## 2020-10-29 DIAGNOSIS — M542 Cervicalgia: Secondary | ICD-10-CM | POA: Diagnosis not present

## 2020-10-29 DIAGNOSIS — M5416 Radiculopathy, lumbar region: Secondary | ICD-10-CM | POA: Diagnosis not present

## 2020-11-11 ENCOUNTER — Encounter: Payer: Self-pay | Admitting: *Deleted

## 2020-12-08 ENCOUNTER — Emergency Department
Admission: EM | Admit: 2020-12-08 | Discharge: 2020-12-09 | Disposition: A | Discharge status: Home / Self Care | Payer: Medicare Other | Attending: Emergency Medicine | Admitting: Emergency Medicine

## 2020-12-08 ENCOUNTER — Emergency Department: Payer: Medicare Other

## 2020-12-08 DIAGNOSIS — E039 Hypothyroidism, unspecified: Secondary | ICD-10-CM | POA: Diagnosis not present

## 2020-12-08 DIAGNOSIS — F17210 Nicotine dependence, cigarettes, uncomplicated: Secondary | ICD-10-CM | POA: Diagnosis not present

## 2020-12-08 DIAGNOSIS — G20 Parkinson's disease: Secondary | ICD-10-CM | POA: Insufficient documentation

## 2020-12-08 DIAGNOSIS — D72829 Elevated white blood cell count, unspecified: Secondary | ICD-10-CM | POA: Diagnosis not present

## 2020-12-08 DIAGNOSIS — Z85828 Personal history of other malignant neoplasm of skin: Secondary | ICD-10-CM | POA: Diagnosis not present

## 2020-12-08 DIAGNOSIS — Z79899 Other long term (current) drug therapy: Secondary | ICD-10-CM | POA: Insufficient documentation

## 2020-12-08 DIAGNOSIS — F3170 Bipolar disorder, currently in remission, most recent episode unspecified: Secondary | ICD-10-CM | POA: Diagnosis not present

## 2020-12-08 DIAGNOSIS — R918 Other nonspecific abnormal finding of lung field: Secondary | ICD-10-CM | POA: Diagnosis not present

## 2020-12-08 DIAGNOSIS — Z743 Need for continuous supervision: Secondary | ICD-10-CM | POA: Diagnosis not present

## 2020-12-08 DIAGNOSIS — R5381 Other malaise: Secondary | ICD-10-CM | POA: Diagnosis not present

## 2020-12-08 DIAGNOSIS — Z20822 Contact with and (suspected) exposure to covid-19: Secondary | ICD-10-CM | POA: Insufficient documentation

## 2020-12-08 DIAGNOSIS — I499 Cardiac arrhythmia, unspecified: Secondary | ICD-10-CM | POA: Diagnosis not present

## 2020-12-08 DIAGNOSIS — R451 Restlessness and agitation: Secondary | ICD-10-CM | POA: Diagnosis not present

## 2020-12-08 DIAGNOSIS — D72819 Decreased white blood cell count, unspecified: Secondary | ICD-10-CM | POA: Diagnosis not present

## 2020-12-08 DIAGNOSIS — Z046 Encounter for general psychiatric examination, requested by authority: Secondary | ICD-10-CM | POA: Diagnosis present

## 2020-12-08 LAB — URINE DRUG SCREEN, QUALITATIVE (ARMC ONLY)
Amphetamines, Ur Screen: NOT DETECTED
Barbiturates, Ur Screen: NOT DETECTED
Benzodiazepine, Ur Scrn: NOT DETECTED
Cannabinoid 50 Ng, Ur ~~LOC~~: NOT DETECTED
Cocaine Metabolite,Ur ~~LOC~~: NOT DETECTED
MDMA (Ecstasy)Ur Screen: NOT DETECTED
Methadone Scn, Ur: NOT DETECTED
Opiate, Ur Screen: NOT DETECTED
Phencyclidine (PCP) Ur S: NOT DETECTED
Tricyclic, Ur Screen: NOT DETECTED

## 2020-12-08 LAB — COMPREHENSIVE METABOLIC PANEL
ALT: 24 U/L (ref 0–44)
AST: 48 U/L — ABNORMAL HIGH (ref 15–41)
Albumin: 4.6 g/dL (ref 3.5–5.0)
Alkaline Phosphatase: 112 U/L (ref 38–126)
Anion gap: 11 (ref 5–15)
BUN: 13 mg/dL (ref 8–23)
CO2: 24 mmol/L (ref 22–32)
Calcium: 10.4 mg/dL — ABNORMAL HIGH (ref 8.9–10.3)
Chloride: 98 mmol/L (ref 98–111)
Creatinine, Ser: 0.96 mg/dL (ref 0.44–1.00)
GFR, Estimated: >60 mL/min (ref >60)
Glucose, Bld: 145 mg/dL — ABNORMAL HIGH (ref 70–99)
Potassium: 3.1 mmol/L — ABNORMAL LOW (ref 3.5–5.1)
Sodium: 133 mmol/L — ABNORMAL LOW (ref 135–145)
Total Bilirubin: 1.1 mg/dL (ref 0.3–1.2)
Total Protein: 7.7 g/dL (ref 6.5–8.1)

## 2020-12-08 LAB — CBC
HCT: 45.1 % (ref 36.0–46.0)
Hemoglobin: 16 g/dL — ABNORMAL HIGH (ref 12.0–15.0)
MCH: 31.6 pg (ref 26.0–34.0)
MCHC: 35.5 g/dL (ref 30.0–36.0)
MCV: 89.1 fL (ref 80.0–100.0)
Platelets: 250 10*3/uL (ref 150–400)
RBC: 5.06 MIL/uL (ref 3.87–5.11)
RDW: 12.5 % (ref 11.5–15.5)
WBC: 19.1 10*3/uL — ABNORMAL HIGH (ref 4.0–10.5)
nRBC: 0 % (ref 0.0–0.2)

## 2020-12-08 LAB — RESP PANEL BY RT-PCR (FLU A&B, COVID) ARPGX2
Influenza A by PCR: NEGATIVE
Influenza B by PCR: NEGATIVE
SARS Coronavirus 2 by RT PCR: NEGATIVE

## 2020-12-08 LAB — URINALYSIS, COMPLETE (UACMP) WITH MICROSCOPIC
Bacteria, UA: NONE SEEN
Bilirubin Urine: NEGATIVE
Glucose, UA: NEGATIVE mg/dL
Ketones, ur: NEGATIVE mg/dL
Leukocytes,Ua: NEGATIVE
Nitrite: NEGATIVE
Protein, ur: NEGATIVE mg/dL
Specific Gravity, Urine: 1.002 — ABNORMAL LOW (ref 1.005–1.030)
Squamous Epithelial / HPF: NONE SEEN (ref 0–5)
pH: 6 (ref 5.0–8.0)

## 2020-12-08 LAB — SALICYLATE LEVEL: Salicylate Lvl: <7 mg/dL — ABNORMAL LOW (ref 7.0–30.0)

## 2020-12-08 LAB — LACTIC ACID, PLASMA: Lactic Acid, Venous: 1.1 mmol/L (ref 0.5–1.9)

## 2020-12-08 LAB — ACETAMINOPHEN LEVEL: Acetaminophen (Tylenol), Serum: <10 ug/mL — ABNORMAL LOW (ref 10–30)

## 2020-12-08 LAB — ETHANOL: Alcohol, Ethyl (B): <10 mg/dL (ref <10)

## 2020-12-08 MED ORDER — LORAZEPAM 2 MG/ML IJ SOLN
2.0000 mg | Freq: Once | INTRAMUSCULAR | Status: AC
  Administered 2020-12-08: 22:00:00 2 mg via INTRAVENOUS
  Filled 2020-12-08: qty 1

## 2020-12-08 MED ORDER — SODIUM CHLORIDE 0.9 % IV BOLUS
1000.0000 mL | Freq: Once | INTRAVENOUS | Status: AC
  Administered 2020-12-08: 22:00:00 1000 mL via INTRAVENOUS

## 2020-12-08 NOTE — ED Provider Notes (Signed)
[facility] Emergency Department Provider Note  ____________________________________________   I have reviewed the triage vital signs and the nursing notes.   HISTORY  Chief Complaint Psychiatric Evaluation   History limited by: Not Limited   HPI Leslie Wong is a 64 y.o. female who presents to the emergency department today because of concerns for feeling like she is burning up.  Patient states that she feels like the inside of her ears burning.  Because of this she has had decreased saliva production and her mouth feels raw.  Symptoms started yesterday.  She says that she felt like she could have slept outside in the cold last night and have been fine.  Patient denies any measured fevers. States she has had decreased oral intake.   Records reviewed. Per medical record review patient has a history of bipolar.  Past Medical History:  Diagnosis Date  . Back pain   . Cancer ([place])    skin  . Depression    BAD  . Gastroparesis   . GERD (gastroesophageal reflux disease)   . Hypothyroidism   . Insomnia   . Migraines   . Parkinson disease ([place])    per [place] [facility], dx'd 2019  . Recurrent cold sores   . Shoulder bursitis   . Urge incontinence     Patient Active Problem List   Diagnosis Date Noted  . Pulmonary nodule 06/01/2020  . Bipolar 1 disorder ([place]) 03/24/2020  . Bipolar I disorder, most recent episode depressed ([place]) 01/07/2020  . MDD (major depressive disorder) 01/02/2020  . Depression 12/11/2019  . Paranoia ([place])   . Cannabis abuse 05/28/2019  . Bipolar 1 disorder, mixed, severe ([place]) 05/27/2019  . Opiate dependence ([place]) 04/25/2019  . Cracking skin 01/27/2019  . Delusion ([place]) 09/26/2018  . Night sweats 05/13/2018  . Pupil asymmetry 05/13/2018  . DDD (degenerative disc disease), cervical 04/23/2018  . Parkinson disease ([place])   . HLD (hyperlipidemia) 03/10/2016  . Advance care planning 03/10/2016  . Cervical radiculopathy  07/30/2015  . Osteopenia 04/18/2014  . Medicare annual wellness visit, subsequent 03/18/2014  . Shoulder pain 01/17/2013  . Chronic back pain 09/14/2011  . Hypercalcemia 03/25/2010  . INCONTINENCE, URGE 04/16/2009  . CARPAL TUNNEL SYNDROME, BILATERAL 09/24/2008  . 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 Hypothyroidism 06/05/2007  . Bipolar affective disorder, current episode hypomanic ([place]) 06/05/2007  . MIGRAINE HEADACHE 06/05/2007  . Gastroparesis 06/05/2007  . Gastroesophageal reflux disease with hiatal hernia 06/05/2007  . Irritable bowel syndrome 06/05/2007  . FIBROCYSTIC BREAST DISEASE 06/05/2007    Past Surgical History:  Procedure Laterality Date  . ABDOMINAL HYSTERECTOMY  1999   total  . APPENDECTOMY    . ESOPHAGOGASTRODUODENOSCOPY  01/2006   negative except small hiatal hernia  . ESOPHAGOGASTRODUODENOSCOPY  02/24/2015   See report  . LAPAROSCOPY     for endometriosis  . OVARIAN CYST REMOVAL  1990   unilateral  . TONSILLECTOMY AND ADENOIDECTOMY      Prior to Admission medications   Medication Sig Start Date End Date Taking? Authorizing Provider  albuterol (VENTOLIN HFA) 108 (90 Base) MCG/ACT inhaler Inhale 2 puffs into the lungs every 6 (six) hours as needed for wheezing or shortness of breath. 07/25/19   Tonia Ghent, MD  levothyroxine (SYNTHROID) 88 MCG tablet Take 1 tablet (88 mcg total) by mouth daily at 6 (six) AM. 05/29/20   Tonia Ghent, MD  olanzapine zydis (ZYPREXA ZYDIS) 15 MG disintegrating tablet Take 1 tablet (15 mg total) by  mouth at bedtime. 05/29/20   Tonia Ghent, MD  omeprazole (PRILOSEC) 20 MG capsule Take 1 capsule (20 mg total) by mouth 2 (two) times daily before a meal. 05/29/20   Tonia Ghent, MD  traZODone (DESYREL) 100 MG tablet Take 1 tablet (100 mg total) by mouth at bedtime. 05/29/20   Tonia Ghent, MD  valACYclovir (VALTREX) 500 MG tablet TAKE 1 TABLET(500 MG) BY MOUTH TWICE DAILY IF NEEDED FOR FEVER BLISTER 05/29/20   Tonia Ghent,  MD    Allergies Doxepin, Valproic acid, Aspirin, Atorvastatin, Baclofen, Codeine, Duloxetine, Erythromycin, Gabapentin, Metoclopramide hcl, Nortriptyline, Nsaids, Nucynta er [tapentadol hcl er], Nucynta [tapentadol], Prednisone, Pregabalin, Seroquel [quetiapine fumerate], Sulfa antibiotics, Topamax, and Vicodin [hydrocodone-acetaminophen]  Family History  Problem Relation Age of Onset  . Hypertension Mother   . Arthritis Mother   . Diabetes Mother   . Stroke Mother   . Cancer Father        lung  . Colon cancer Neg Hx   . Breast cancer Neg Hx     Social History Social History   Tobacco Use  . Smoking status: Current Some Day Smoker    Packs/day: 1.00    Years: 42.50    Pack years: 42.50    Types: Cigarettes  . Smokeless tobacco: Former Systems developer  . Tobacco comment: declined  Vaping Use  . Vaping Use: Never used  Substance Use Topics  . Alcohol use: No    Alcohol/week: 0.0 standard drinks  . Drug use: No    Review of Systems Constitutional: Positive for feeling hot. Eyes: No visual changes. ENT: No sore throat. Cardiovascular: Denies chest pain. Respiratory: Denies shortness of breath. Gastrointestinal: Decreased oral intake. Genitourinary: Negative for dysuria. Musculoskeletal: Positive for neck pain. Skin: Negative for rash. Neurological: Negative for headaches, focal weakness or numbness.  ____________________________________________   PHYSICAL EXAM:  VITAL SIGNS: ED Triage Vitals  Enc Vitals Group     BP 143/87     Pulse 104     Resp 20     Temp 98.2     Temp src      SpO2 97   Constitutional: Alert and oriented.  Eyes: Conjunctivae are normal.  ENT      Head: Normocephalic and atraumatic.      Nose: No congestion/rhinnorhea.      Mouth/Throat: Mucous membranes are moist.      Neck: No stridor. Hematological/Lymphatic/Immunilogical: No cervical lymphadenopathy. Cardiovascular: Normal rate, regular rhythm.  No murmurs, rubs, or gallops.   Respiratory: Normal respiratory effort without tachypnea nor retractions. Breath sounds are clear and equal bilaterally. No wheezes/rales/rhonchi. Gastrointestinal: Soft and non tender. No rebound. No guarding.  Genitourinary: Deferred Musculoskeletal: Normal range of motion in all extremities. No lower extremity edema. Neurologic:  Moving all extremities. Skin:  Skin is warm, dry and intact. No rash noted. Psychiatric: Pressured speech. Poor insight.   ____________________________________________    LABS (pertinent positives/negatives)  Acetaminophen, salicylate, ethanol below threshold COVID negative UDS negative CMP na 133, k 3.1, glu 145, cr 0.96 CBC wbc 19.1, hgb 16.0, plt 250 ____________________________________________   EKG  None  ____________________________________________    RADIOLOGY  CXR pending   ____________________________________________   PROCEDURES  Procedures  ____________________________________________   INITIAL IMPRESSION / ASSESSMENT AND PLAN / ED COURSE  Pertinent labs & imaging results that were available during my care of the patient were reviewed by me and considered in my medical decision making (see chart for details).   Patient presents to the  emergency department today because she feels hot inside.  Additionally she feels like her body cannot produce any moisture.  On exam patient does have some pressured speech.  She also had complaints of some neck pain.  Patient did have elevated WBC count. On exam she was able to touch her chin to her chest and had negative kernigs sign.  She states that the pain in the neck is on the sides of her neck.  She is not complaining of any headache.  She does not appear toxic.  This time I would have a low suspicion for meningitis and do think risks of LP would outweigh low suspicion for meningitis. Will extend work-up to include UA, chest x-ray, lactic acidosis and blood cultures to further evaluate  elevated white blood cell count.  Additionally given patient's pressured speech do have concerns for possible psychiatric illness.  Per chart review the patient has had issues with elevated white blood cell counts in the past when she has been seen for psychiatric illness.  ____________________________________________   FINAL CLINICAL IMPRESSION(S) / ED DIAGNOSES  Final diagnoses:  Agitation  Leukocytosis, unspecified type     Note: This dictation was prepared with Dragon dictation. Any transcriptional errors that result from this process are unintentional     Nance Pear, MD 12/08/20 [phone]

## 2020-12-08 NOTE — ED Notes (Signed)
Pt states she placed neck brace on self today due to feeling she couldn't hold her head up when she woke up this morning. Pt states daughter bought it and she did not receive from Dr. Abbott Pao walked well with EMS on arrival and in ED, when to restroom to dress out pt acts as if she is not able to walk and too weak suddenly. Pt able to do all things needed on own power. Pt concerned due to feeling she is being admitted to a psychiatric facility but is reassured that she is going to be examined by MD for medical reasons. Provided with ice

## 2020-12-08 NOTE — ED Notes (Signed)
Pt returns from XRAY at this time

## 2020-12-08 NOTE — ED Notes (Signed)
,  Hourly rounding completed at this time, patient currently awake in hallway bed. No complaints, stable, and in no acute distress. Q15 minute rounds and monitoring via Rover and Officer to continue. 

## 2020-12-08 NOTE — ED Triage Notes (Signed)
Pt comes from home via [facility] due to feeling her whole body is burning from the inside from hell.   Pt arrives to ED complaining of full body pain and states she has no saliva and her nose is not making any fluid. Pt is extremely anxious and believes she is going to die.   Pt is in a revealing shirt and has a neck brace on that she states her daughter bought for her because she felt she couldn't move her head anymore. Pt denies SI, HI, AVH.

## 2020-12-08 NOTE — ED Notes (Signed)
Hourly rounding completed at this time, patient currently awake in hallway bed. No complaints, stable, and in no acute distress. Q15 minute rounds and monitoring via Rover and Officer to continue. 

## 2020-12-08 NOTE — ED Notes (Signed)
Blood cultures collected by this nurse, # 1 RAC, #2 LAC. Sent to lab now

## 2020-12-08 NOTE — BH Assessment (Signed)
Assessment Note  Leslie Wong is an 64 y.o. female presenting to [facility] ED voluntarily with complaints of physical pain and dehydration. Per triage note Pt comes from home via [facility] due to feeling her whole body is burning from the inside from hell. Pt arrives to ED complaining of full body pain and states she has no saliva and her nose is not making any fluid. Pt is extremely anxious and believes she is going to die. Pt is in a revealing shirt and has a neck brace on that she states her daughter bought for her because she felt she couldn't move her head anymore. Pt denies SI, HI, AVH. During assessment patient appears alert and oriented x4, anxious but cooperative. Patient reports what brings her to the ED "my body is severely dehydrated inside and out, the inside of my mouth is dry and I have a stiff neck, my body feels like it is burring in the pits of hell." Patient reports she has been experiencing these symptoms for "1 week." "I had to physically hold my head up and my mouth is as dry as a baby's bottom." Patient does report current outpatient psychiatric treatment with [facility] and reports that she is taking her medications as prescribed. Patient denies SI/HI/AH/VH and does not appear to be responding to any internal or external stimuli.   Patient disposition pending, patient to be seen by [facility]  Diagnosis: Bipolar by history   Past Medical History:  Past Medical History:  Diagnosis Date  . Back pain   . Cancer ([place])    skin  . Depression    BAD  . Gastroparesis   . GERD (gastroesophageal reflux disease)   . Hypothyroidism   . Insomnia   . Migraines   . Parkinson disease ([place])    per [place] [facility], dx'd 2019  . Recurrent cold sores   . Shoulder bursitis   . Urge incontinence     Past Surgical History:  Procedure Laterality Date  . ABDOMINAL HYSTERECTOMY  1999   total  . APPENDECTOMY    . ESOPHAGOGASTRODUODENOSCOPY  01/2006   negative except small hiatal  hernia  . ESOPHAGOGASTRODUODENOSCOPY  02/24/2015   See report  . LAPAROSCOPY     for endometriosis  . OVARIAN CYST REMOVAL  1990   unilateral  . TONSILLECTOMY AND ADENOIDECTOMY      Family History:  Family History  Problem Relation Age of Onset  . Hypertension Mother   . Arthritis Mother   . Diabetes Mother   . Stroke Mother   . Cancer Father        lung  . Colon cancer Neg Hx   . Breast cancer Neg Hx     Social History:  reports that she has been smoking cigarettes. She has a 42.50 pack-year smoking history. She has quit using smokeless tobacco. She reports that she does not drink alcohol and does not use drugs.  Additional Social History:  Alcohol / Drug Use Pain Medications: See MAR Prescriptions: See MAR Over the Counter: See MAR History of alcohol / drug use?: No history of alcohol / drug abuse  CIWA: CIWA-Ar BP: (!) 143/87 Pulse Rate: (!) 104 COWS:    Allergies:  Allergies  Allergen Reactions  . Doxepin Other (See Comments)    Pt reports "that made me have seizures"  . Valproic Acid Shortness Of Breath  . Aspirin     REACTION: UNSPECIFIED  . Atorvastatin Other (See Comments)    Tongue swelling,  legs cramping  . Baclofen     REACTION: Throat swells up  . Codeine     REACTION: UNSPECIFIED  . Duloxetine Other (See Comments)    Vision loss, weight loss  . Erythromycin     REACTION: UNSPECIFIED  . Gabapentin     REACTION: throat swells up  . Metoclopramide Hcl     REACTION: states "messed me up"  . Nortriptyline     Other reaction(s): Other (See Comments) Vision issues  . Nsaids Other (See Comments)    Stomach problems  . Nucynta Er [Tapentadol Hcl Er]     Intolerant, see note from 07/24/12.    Gean [place] [Tapentadol]     AMS  . Prednisone     GI intolance  . Pregabalin   . Seroquel [Quetiapine Fumerate] Other (See Comments)    Loss control, felt like on another planet  . Sulfa Antibiotics Nausea And Vomiting  . Topamax Other (See Comments)     Blisters in mouth and tongue  . Vicodin [Hydrocodone-Acetaminophen]     Nausea, thrush and constipation    Home Medications: (Not in a hospital admission)   OB/GYN Status:  No LMP recorded. Patient has had a hysterectomy.  General Assessment Data Location of Assessment: [facility] ED TTS Assessment: In system Is this a Tele or Face-to-Face Assessment?: Face-to-Face Is this an Initial Assessment or a Re-assessment for this encounter?: Initial Assessment Patient Accompanied by:: N/A Language Other than English: No Living Arrangements: Other (Comment) (Private Residence) What gender do you identify as?: Female Marital status: Single Pregnancy Status: No Living Arrangements: Alone Can pt return to current living arrangement?: Yes Admission Status: Voluntary Is patient capable of signing voluntary admission?: Yes Referral Source: Self/Family/Friend Insurance type: [place] Screening Exam ([place]) Medical Exam completed: Yes  Crisis Care Plan Living Arrangements: Alone Legal Guardian: Other: (Self) Name of Psychiatrist: [place] Name of Therapist: [place]  Education Status Is patient currently in school?: No Is the patient employed, unemployed or receiving disability?: Receiving disability income  Risk to self with the past 6 months Suicidal Ideation: No Has patient been a risk to self within the past 6 months prior to admission? : No Suicidal Intent: No Has patient had any suicidal intent within the past 6 months prior to admission? : No Is patient at risk for suicide?: No Suicidal Plan?: No Has patient had any suicidal plan within the past 6 months prior to admission? : No Access to Means: No What has been your use of drugs/alcohol within the last 12 months?: None Previous Attempts/Gestures: No How many times?: 0 Other Self Harm Risks: None Triggers for Past Attempts: None known Intentional Self Injurious Behavior:  None Family Suicide History: No Recent stressful life event(s): Other (Comment) (None reported) Persecutory voices/beliefs?: No Depression: No Substance abuse history and/or treatment for substance abuse?: No Suicide prevention information given to non-admitted patients: Not applicable  Risk to Others within the past 6 months Homicidal Ideation: No Does patient have any lifetime risk of violence toward others beyond the six months prior to admission? : No Thoughts of Harm to Others: No Current Homicidal Intent: No Current Homicidal Plan: No Access to Homicidal Means: No Identified Victim: None History of harm to others?: No Assessment of Violence: None Noted Violent Behavior Description: None Does patient have access to weapons?: No Criminal Charges Pending?: No Does patient have a court date: No Is patient on probation?: No  Psychosis Hallucinations: None noted Delusions: Somatic  Mental Status Report Appearance/Hygiene: In scrubs Eye Contact: Good Motor Activity: Freedom of movement Speech: Logical/coherent Level of Consciousness: Alert Mood: Pleasant,Anxious Affect: Appropriate to circumstance Anxiety Level: Moderate Thought Processes: Coherent Judgement: Unimpaired Orientation: Person,Time,Place,Situation,Appropriate for developmental age Obsessive Compulsive Thoughts/Behaviors: None  Cognitive Functioning Concentration: Normal Memory: Recent Intact,Remote Intact Is patient IDD: No Insight: Fair Impulse Control: Good Appetite: Poor Have you had any weight changes? : No Change Sleep: Decreased Total Hours of Sleep: 3 Vegetative Symptoms: None  ADLScreening [facility] Assessment Services) Patient's cognitive ability adequate to safely complete daily activities?: Yes Patient able to express need for assistance with ADLs?: Yes Independently performs ADLs?: Yes (appropriate for developmental age)  Prior Inpatient Therapy Prior Inpatient Therapy: Yes Prior Therapy  Dates: 02/26/2020,12/2019 Prior Therapy Facilty/Provider(s): [facility] Reason for Treatment: Bipolar  Prior Outpatient Therapy Prior Outpatient Therapy: Yes Prior Therapy Dates: Currently Prior Therapy Facilty/Provider(s): [place] Reason for Treatment: Bipolar Does patient have an ACCT team?: No Does patient have Intensive In-House Services?  : No Does patient have [facility] services? : No Does patient have P4CC services?: No  ADL Screening (condition at time of admission) Patient's cognitive ability adequate to safely complete daily activities?: Yes Is the patient deaf or have difficulty hearing?: No Does the patient have difficulty seeing, even when wearing glasses/contacts?: No Does the patient have difficulty concentrating, remembering, or making decisions?: No Patient able to express need for assistance with ADLs?: Yes Does the patient have difficulty dressing or bathing?: No Independently performs ADLs?: Yes (appropriate for developmental age) Does the patient have difficulty walking or climbing stairs?: No Weakness of Legs: None Weakness of Arms/Hands: None  Home Assistive Devices/Equipment Home Assistive Devices/Equipment: None  Therapy Consults (therapy consults require a physician order) PT Evaluation Needed: No OT Evalulation Needed: No SLP Evaluation Needed: No       Advance Directives (For Healthcare) Does Patient Have a Medical Advance Directive?: No          Disposition: Patient disposition pending, patient to be seen by [facility] Disposition Initial Assessment Completed for this Encounter: Yes  On Site Evaluation by:   Reviewed with Physician:    Leonie Douglas MS [place] 12/08/2020 10:21 PM

## 2020-12-08 NOTE — ED Notes (Signed)
Pt transferred to XRAY at this time

## 2020-12-08 NOTE — ED Notes (Signed)
Pt dressed out into hospital provided scrubs (blue scrub top, wine pants, socks, mask). Pt dressed by this nurse and Caitlyn, Tech. Items collected and placed in belonging bag, 1 of 1 and labeled with pt visit sticker. Items include:  Grey leggings Tan tank top 2 black/gray flip flops 1 gold ring 1 white neck brace 1 black head band 1 black hair tie 1 patterned mask

## 2020-12-09 DIAGNOSIS — F3170 Bipolar disorder, currently in remission, most recent episode unspecified: Secondary | ICD-10-CM

## 2020-12-09 LAB — PROCALCITONIN: Procalcitonin: <0.1 ng/mL

## 2020-12-09 NOTE — ED Notes (Signed)
Hourly rounding completed at this time, patient currently asleep in hallway bed. No complaints, stable, and in no acute distress. Q15 minute rounds and monitoring via Rover and Officer to continue. 

## 2020-12-09 NOTE — ED Notes (Signed)
Patient discharged to home. Belongings returned patient getting dressed will walk patient to discharge area.

## 2020-12-09 NOTE — ED Provider Notes (Signed)
-----------------------------------------   12:23 AM on 12/09/2020 -----------------------------------------   Blood pressure 118/78, pulse 94, temperature 98.2 F (36.8 C), temperature source Oral, resp. rate 18, height 1.575 m (5\' 2" ), weight 65.8 kg, SpO2 97 %.  Assumed care from Dr. Archie Balboa.  The patient is here to see psychiatry but also has some complaints of pain and has a leukocytosis of 19.  However Dr. Archie Balboa looked back through the record and verify that she has had nonspecific leukocytosis in the past in the absence of any emergent medical condition or infection when she has had similar episodes of psychiatric agitation.  Her blood cultures are pending.  Lactic acid is 1.1.  The rest of her lab work is all within normal limits other than a slightly decreased potassium level, very slight AST increase, and a leukocytosis of 19.1.  Her urine is negative other than very mild hemoglobinuria but without any evidence of infection.  I personally reviewed the patient's imaging and agree with the radiologist's interpretation that the mild peribronchial thickening and hyperinflation likely reflects chronic changes rather than an acute abnormality.  At this point there is no indication the patient needs additional medical evaluation or treatment.  For further confirmation I am adding on a procalcitonin level but she does not seem to have any evidence of an acute infection.  She is awaiting psychiatric evaluation.  The patient has been placed in psychiatric observation due to the need to provide a safe environment for the patient while obtaining psychiatric consultation and evaluation, as well as ongoing medical and medication management to treat the patient's condition.  The patient has not been placed under full IVC at this time.  ----------------------------------------- 2:45 AM on 12/09/2020 -----------------------------------------  Of note, procalcitonin was negative, which is reassuring.   Patient resting, awaiting psych evaluation and disposition.   Hinda Kehr, MD 12/09/20 [phone]

## 2020-12-09 NOTE — ED Notes (Signed)
Pt able to be cleared by EDP. IV removed. OK to go to [facility].

## 2020-12-09 NOTE — ED Notes (Addendum)
Hourly rounding completed at this time, patient currently asleep in hallway bed. No complaints, stable, and in no acute distress. Q15 minute rounds and monitoring via Rover and Officer to continue. 

## 2020-12-09 NOTE — ED Notes (Signed)
Lab contacted at this time to have add-on lab processed. Allen in lab states he will work on it.

## 2020-12-09 NOTE — ED Provider Notes (Signed)
Emergency Medicine Observation Re-evaluation Note  Leslie Wong is a 64 y.o. female, seen on rounds today.  Pt initially presented to the ED for complaints of Psychiatric Evaluation Currently, the patient is calm.  Physical Exam  BP 106/67 (BP Location: Left Arm)   Pulse 75   Temp 98.2 F (36.8 C) (Oral)   Resp 15   Ht 5\' 2"  (1.575 m)   Wt 65.8 kg   SpO2 92%   BMI 26.52 kg/m  Physical Exam General: nad Lungs: unlabored breathing Psych: calm, no si/hi/hallucinations  ED Course / MDM  EKG:    I have reviewed the labs performed to date as well as medications administered while in observation.  Recent changes in the last 24 hours include psych cleared.  Plan  Current plan is for discharge home. Patient is not under full IVC at this time.   Carrie Mew, MD 12/09/20 1105

## 2020-12-09 NOTE — ED Notes (Signed)
No v-signs patient sleeping

## 2020-12-09 NOTE — ED Notes (Addendum)
Patient awaiting sister to pick her up. Patient lives in [place] arrived via ambulance

## 2020-12-09 NOTE — ED Notes (Signed)
Report to jeanette, rn.  

## 2020-12-09 NOTE — Consult Note (Signed)
[facility] Face-to-Face Psychiatry Consult   Reason for Consult: Consult for 65 year old woman with a history of bipolar or schizoaffective disorder Referring Physician: Joni Fears Patient Identification: Leslie Wong MRN:  [id number] Principal Diagnosis: Bipolar disorder in remission [facility]) Diagnosis:  Principal Problem:   Bipolar disorder in remission ([place])   Total Time spent with patient: 1 hour  Subjective:   Leslie Wong is a 64 y.o. female patient admitted with "I feel dehydrated".  HPI: Patient seen chart reviewed.  Patient came to the emergency room with a chief complaint of feeling that she was dehydrated because she has not been able to eat or drink anything for a day or 2.  She says this is because her mouth is hurting.  She says her mouth and throat feel like they are raw and it is painful to eat and drink.  Generally feeling fatigued.  Also feels like her skin is dry.  However, from a psychiatric standpoint she denies all acute symptoms.  Denies being depressed.  Denies suicidal thoughts.  Denies hallucinations.  Patient states that she has been taking care of her self and remains compliant with her medication.  Past Psychiatric History: Long history of mental health problems multiple prior hospitalizations history of past suicidality.  Risk to Self: Suicidal Ideation: No Suicidal Intent: No Is patient at risk for suicide?: No Suicidal Plan?: No Access to Means: No What has been your use of drugs/alcohol within the last 12 months?: None How many times?: 0 Other Self Harm Risks: None Triggers for Past Attempts: None known Intentional Self Injurious Behavior: None Risk to Others: Homicidal Ideation: No Thoughts of Harm to Others: No Current Homicidal Intent: No Current Homicidal Plan: No Access to Homicidal Means: No Identified Victim: None History of harm to others?: No Assessment of Violence: None Noted Violent Behavior Description: None Does patient  have access to weapons?: No Criminal Charges Pending?: No Does patient have a court date: No Prior Inpatient Therapy: Prior Inpatient Therapy: Yes Prior Therapy Dates: 02/26/2020,12/2019 Prior Therapy Facilty/Provider(s): [facility] Reason for Treatment: Bipolar Prior Outpatient Therapy: Prior Outpatient Therapy: Yes Prior Therapy Dates: Currently Prior Therapy Facilty/Provider(s): [place] Reason for Treatment: Bipolar Does patient have an ACCT team?: No Does patient have Intensive In-House Services?  : No Does patient have [facility] services? : No Does patient have P4CC services?: No  Past Medical History:  Past Medical History:  Diagnosis Date  . Back pain   . Cancer ([place])    skin  . Depression    BAD  . Gastroparesis   . GERD (gastroesophageal reflux disease)   . Hypothyroidism   . Insomnia   . Migraines   . Parkinson disease ([place])    per [place] [facility], dx'd 2019  . Recurrent cold sores   . Shoulder bursitis   . Urge incontinence     Past Surgical History:  Procedure Laterality Date  . ABDOMINAL HYSTERECTOMY  1999   total  . APPENDECTOMY    . ESOPHAGOGASTRODUODENOSCOPY  01/2006   negative except small hiatal hernia  . ESOPHAGOGASTRODUODENOSCOPY  02/24/2015   See report  . LAPAROSCOPY     for endometriosis  . OVARIAN CYST REMOVAL  1990   unilateral  . TONSILLECTOMY AND ADENOIDECTOMY     Family History:  Family History  Problem Relation Age of Onset  . Hypertension Mother   . Arthritis Mother   . Diabetes Mother   . Stroke Mother   . Cancer Father  lung  . Colon cancer Neg Hx   . Breast cancer Neg Hx    Family Psychiatric  History: See previous.  No mental health reported Social History:  Social History   Substance and Sexual Activity  Alcohol Use No  . Alcohol/week: 0.0 standard drinks     Social History   Substance and Sexual Activity  Drug Use No    Social History   Socioeconomic History  . Marital status: Divorced     Spouse name: Not on file  . Number of children: Not on file  . Years of education: Not on file  . Highest education level: Not on file  Occupational History  . Not on file  Tobacco Use  . Smoking status: Current Some Day Smoker    Packs/day: 1.00    Years: 42.50    Pack years: 42.50    Types: Cigarettes  . Smokeless tobacco: Former Systems developer  . Tobacco comment: declined  Vaping Use  . Vaping Use: Never used  Substance and Sexual Activity  . Alcohol use: No    Alcohol/week: 0.0 standard drinks  . Drug use: No  . Sexual activity: Never  Other Topics Concern  . Not on file  Social History Narrative   Lives alone.     Has a dog names Dixie   Social Determinants of Health   Financial Resource Strain: Not on file  Food Insecurity: Not on file  Transportation Needs: Not on file  Physical Activity: Not on file  Stress: Not on file  Social Connections: Not on file   Additional Social History:    Allergies:   Allergies  Allergen Reactions  . Doxepin Other (See Comments)    Pt reports "that made me have seizures"  . Valproic Acid Shortness Of Breath  . Aspirin     REACTION: UNSPECIFIED  . Atorvastatin Other (See Comments)    Tongue swelling, legs cramping  . Baclofen     REACTION: Throat swells up  . Codeine     REACTION: UNSPECIFIED  . Duloxetine Other (See Comments)    Vision loss, weight loss  . Erythromycin     REACTION: UNSPECIFIED  . Gabapentin     REACTION: throat swells up  . Metoclopramide Hcl     REACTION: states "messed me up"  . Nortriptyline     Other reaction(s): Other (See Comments) Vision issues  . Nsaids Other (See Comments)    Stomach problems  . Nucynta Er [Tapentadol Hcl Er]     Intolerant, see note from 07/24/12.    Gean [place] [Tapentadol]     AMS  . Prednisone     GI intolance  . Pregabalin   . Seroquel [Quetiapine Fumerate] Other (See Comments)    Loss control, felt like on another planet  . Sulfa Antibiotics Nausea And Vomiting  .  Topamax Other (See Comments)    Blisters in mouth and tongue  . Vicodin [Hydrocodone-Acetaminophen]     Nausea, thrush and constipation    Labs:  Results for orders placed or performed during the hospital encounter of 12/08/20 (from the past 48 hour(s))  Urinalysis, Complete w Microscopic     Status: Abnormal   Collection Time: 12/08/20  8:20 PM  Result Value Ref Range   Color, Urine COLORLESS (A) YELLOW   APPearance CLEAR (A) CLEAR   Specific Gravity, Urine 1.002 (L) 1.005 - 1.030   pH 6.0 5.0 - 8.0   Glucose, UA NEGATIVE NEGATIVE mg/dL   Hgb urine dipstick MODERATE (  A) NEGATIVE   Bilirubin Urine NEGATIVE NEGATIVE   Ketones, ur NEGATIVE NEGATIVE mg/dL   Protein, ur NEGATIVE NEGATIVE mg/dL   Nitrite NEGATIVE NEGATIVE   Leukocytes,Ua NEGATIVE NEGATIVE   RBC / HPF 0-5 0 - 5 RBC/hpf   WBC, UA 0-5 0 - 5 WBC/hpf   Bacteria, UA NONE SEEN NONE SEEN   Squamous Epithelial / LPF NONE SEEN 0 - 5    Comment: Performed at [facility], [address] [place], [place] [address]  Comprehensive metabolic panel     Status: Abnormal   Collection Time: 12/08/20  8:29 PM  Result Value Ref Range   Sodium 133 (L) 135 - 145 mmol/L   Potassium 3.1 (L) 3.5 - 5.1 mmol/L   Chloride 98 98 - 111 mmol/L   CO2 24 22 - 32 mmol/L   Glucose, Bld 145 (H) 70 - 99 mg/dL    Comment: Glucose reference range applies only to samples taken after fasting for at least 8 hours.   BUN 13 8 - 23 mg/dL   Creatinine, Ser 0.96 0.44 - 1.00 mg/dL   Calcium 10.4 (H) 8.9 - 10.3 mg/dL   Total Protein 7.7 6.5 - 8.1 g/dL   Albumin 4.6 3.5 - 5.0 g/dL   AST 48 (H) 15 - 41 U/L   ALT 24 0 - 44 U/L   Alkaline Phosphatase 112 38 - 126 U/L   Total Bilirubin 1.1 0.3 - 1.2 mg/dL   GFR, Estimated >60 >60 mL/min    Comment: (NOTE) Calculated using the CKD-EPI Creatinine Equation (2021)    Anion gap 11 5 - 15    Comment: Performed at [facility], [address] [place], [place] [address]  Ethanol     Status:  None   Collection Time: 12/08/20  8:29 PM  Result Value Ref Range   Alcohol, Ethyl (B) <10 <10 mg/dL    Comment: (NOTE) Lowest detectable limit for serum alcohol is 10 mg/dL.  For medical purposes only. Performed at [facility], [address] [place], [place] [address]   Salicylate level     Status: Abnormal   Collection Time: 12/08/20  8:29 PM  Result Value Ref Range   Salicylate Lvl <2.9 (L) 7.0 - 30.0 mg/dL    Comment: Performed at [facility], [address] [place], [place] [address]  Acetaminophen level     Status: Abnormal   Collection Time: 12/08/20  8:29 PM  Result Value Ref Range   Acetaminophen (Tylenol), Serum <10 (L) 10 - 30 ug/mL    Comment: (NOTE) Therapeutic concentrations vary significantly. A range of 10-30 ug/mL  may be an effective concentration for many patients. However, some  are best treated at concentrations outside of this range. Acetaminophen concentrations >150 ug/mL at 4 hours after ingestion  and >50 ug/mL at 12 hours after ingestion are often associated with  toxic reactions.  Performed at [facility], [address] [place], [place] [address]   cbc     Status: Abnormal   Collection Time: 12/08/20  8:29 PM  Result Value Ref Range   WBC 19.1 (H) 4.0 - 10.5 K/uL   RBC 5.06 3.87 - 5.11 MIL/uL   Hemoglobin 16.0 (H) 12.0 - 15.0 g/dL   HCT 45.1 36.0 - 46.0 %   MCV 89.1 80.0 - 100.0 fL   MCH 31.6 26.0 - 34.0 pg   MCHC 35.5 30.0 - 36.0 g/dL   RDW 12.5 11.5 - 15.5 %  Platelets 250 150 - 400 K/uL   nRBC 0.0 0.0 - 0.2 %    Comment: Performed at [facility], [address] [place], [place] [address]  Urine Drug Screen, Qualitative     Status: None   Collection Time: 12/08/20  8:29 PM  Result Value Ref Range   Tricyclic, Ur Screen NONE DETECTED NONE DETECTED   Amphetamines, Ur Screen NONE DETECTED NONE DETECTED   MDMA (Ecstasy)Ur Screen NONE DETECTED NONE DETECTED   Cocaine Metabolite,Ur Lares NONE  DETECTED NONE DETECTED   Opiate, Ur Screen NONE DETECTED NONE DETECTED   Phencyclidine (PCP) Ur S NONE DETECTED NONE DETECTED   Cannabinoid 50 Ng, Ur  NONE DETECTED NONE DETECTED   Barbiturates, Ur Screen NONE DETECTED NONE DETECTED   Benzodiazepine, Ur Scrn NONE DETECTED NONE DETECTED   Methadone Scn, Ur NONE DETECTED NONE DETECTED    Comment: (NOTE) Tricyclics + metabolites, urine    Cutoff 1000 ng/mL Amphetamines + metabolites, urine  Cutoff 1000 ng/mL MDMA (Ecstasy), urine              Cutoff 500 ng/mL Cocaine Metabolite, urine          Cutoff 300 ng/mL Opiate + metabolites, urine        Cutoff 300 ng/mL Phencyclidine (PCP), urine         Cutoff 25 ng/mL Cannabinoid, urine                 Cutoff 50 ng/mL Barbiturates + metabolites, urine  Cutoff 200 ng/mL Benzodiazepine, urine              Cutoff 200 ng/mL Methadone, urine                   Cutoff 300 ng/mL  The urine drug screen provides only a preliminary, unconfirmed analytical test result and should not be used for non-medical purposes. Clinical consideration and professional judgment should be applied to any positive drug screen result due to possible interfering substances. A more specific alternate chemical method must be used in order to obtain a confirmed analytical result. Gas chromatography / mass spectrometry (GC/MS) is the preferred confirm atory method. Performed at [facility], [address] [place], [place] [address]   Procalcitonin - Baseline     Status: None   Collection Time: 12/08/20  8:29 PM  Result Value Ref Range   Procalcitonin <0.10 ng/mL    Comment:        Interpretation: PCT (Procalcitonin) <= 0.5 ng/mL: Systemic infection (sepsis) is not likely. Local bacterial infection is possible. (NOTE)       Sepsis PCT Algorithm           Lower Respiratory Tract                                      Infection PCT Algorithm    ----------------------------     ----------------------------          PCT < 0.25 ng/mL                PCT < 0.10 ng/mL          Strongly encourage             Strongly discourage   discontinuation of antibiotics    initiation of antibiotics    ----------------------------     -----------------------------       PCT 0.25 - 0.50  ng/mL            PCT 0.10 - 0.25 ng/mL               OR       >80% decrease in PCT            Discourage initiation of                                            antibiotics      Encourage discontinuation           of antibiotics    ----------------------------     -----------------------------         PCT >= 0.50 ng/mL              PCT 0.26 - 0.50 ng/mL               AND        <80% decrease in PCT             Encourage initiation of                                             antibiotics       Encourage continuation           of antibiotics    ----------------------------     -----------------------------        PCT >= 0.50 ng/mL                  PCT > 0.50 ng/mL               AND         increase in PCT                  Strongly encourage                                      initiation of antibiotics    Strongly encourage escalation           of antibiotics                                     -----------------------------                                           PCT <= 0.25 ng/mL                                                 OR                                        > 80% decrease in PCT  Discontinue / Do not initiate                                             antibiotics  Performed at [facility], [address] [place], [place] [address]   Resp Panel by RT-PCR (Flu A&B, Covid) Nasopharyngeal Swab     Status: None   Collection Time: 12/08/20  9:34 PM   Specimen: Nasopharyngeal Swab; Nasopharyngeal(NP) swabs in vial transport medium  Result Value Ref Range   SARS Coronavirus 2 by RT PCR NEGATIVE NEGATIVE    Comment: (NOTE) SARS-CoV-2 target nucleic acids are NOT  DETECTED.  The SARS-CoV-2 RNA is generally detectable in upper respiratory specimens during the acute phase of infection. The lowest concentration of SARS-CoV-2 viral copies this assay can detect is 138 copies/mL. A negative result does not preclude SARS-Cov-2 infection and should not be used as the sole basis for treatment or other patient management decisions. A negative result may occur with  improper specimen collection/handling, submission of specimen other than nasopharyngeal swab, presence of viral mutation(s) within the areas targeted by this assay, and inadequate number of viral copies(<138 copies/mL). A negative result must be combined with clinical observations, patient history, and epidemiological information. The expected result is Negative.  Fact Sheet for Patients:  EntrepreneurPulse.com.au  Fact Sheet for Healthcare Providers:  IncredibleEmployment.be  This test is no t yet approved or cleared by the [place] FDA and  has been authorized for detection and/or diagnosis of SARS-CoV-2 by FDA under an Emergency Use Authorization (EUA). This EUA will remain  in effect (meaning this test can be used) for the duration of the COVID-19 declaration under Section 564(b)(1) of the Act, 21 U.S.C.section 360bbb-3(b)(1), unless the authorization is terminated  or revoked sooner.       Influenza A by PCR NEGATIVE NEGATIVE   Influenza B by PCR NEGATIVE NEGATIVE    Comment: (NOTE) The Xpert Xpress SARS-CoV-2/FLU/RSV plus assay is intended as an aid in the diagnosis of influenza from Nasopharyngeal swab specimens and should not be used as a sole basis for treatment. Nasal washings and aspirates are unacceptable for Xpert Xpress SARS-CoV-2/FLU/RSV testing.  Fact Sheet for Patients: EntrepreneurPulse.com.au  Fact Sheet for Healthcare Providers: IncredibleEmployment.be  This test is not yet approved or  cleared by the [place] FDA and has been authorized for detection and/or diagnosis of SARS-CoV-2 by FDA under an Emergency Use Authorization (EUA). This EUA will remain in effect (meaning this test can be used) for the duration of the COVID-19 declaration under Section 564(b)(1) of the Act, 21 U.S.C. section 360bbb-3(b)(1), unless the authorization is terminated or revoked.  Performed at [facility], [address] [place], [place] [address]   Lactic acid, plasma     Status: None   Collection Time: 12/08/20 10:50 PM  Result Value Ref Range   Lactic Acid, Venous 1.1 0.5 - 1.9 mmol/L    Comment: Performed at [facility], [address] [place], [place] [address]  Blood culture (routine x 2)     Status: None (Preliminary result)   Collection Time: 12/08/20 10:50 PM   Specimen: BLOOD  Result Value Ref Range   Specimen Description BLOOD RIGHT ANTECUBITAL    Special Requests      BOTTLES DRAWN AEROBIC AND ANAEROBIC Blood Culture adequate volume   Culture      NO GROWTH <  12 HOURS Performed at [facility], [address] [place], [place] [address]    Report Status PENDING   Blood culture (routine x 2)     Status: None (Preliminary result)   Collection Time: 12/08/20 10:55 PM   Specimen: BLOOD  Result Value Ref Range   Specimen Description BLOOD LEFT ANTECUBITAL    Special Requests      BOTTLES DRAWN AEROBIC AND ANAEROBIC Blood Culture adequate volume   Culture      NO GROWTH < 12 HOURS Performed at [facility], [address], [place], [place] [address]    Report Status PENDING     No current facility-administered medications for this encounter.   Current Outpatient Medications  Medication Sig Dispense Refill  . albuterol (VENTOLIN HFA) 108 (90 Base) MCG/ACT inhaler Inhale 2 puffs into the lungs every 6 (six) hours as needed for wheezing or shortness of breath. 18 g 11  . levothyroxine (SYNTHROID) 88 MCG tablet Take 1 tablet  (88 mcg total) by mouth daily at 6 (six) AM. 90 tablet 1  . olanzapine zydis (ZYPREXA ZYDIS) 15 MG disintegrating tablet Take 1 tablet (15 mg total) by mouth at bedtime. 90 tablet 0  . omeprazole (PRILOSEC) 20 MG capsule Take 1 capsule (20 mg total) by mouth 2 (two) times daily before a meal. 180 capsule 1  . traZODone (DESYREL) 100 MG tablet Take 1 tablet (100 mg total) by mouth at bedtime. 90 tablet 0  . valACYclovir (VALTREX) 500 MG tablet TAKE 1 TABLET(500 MG) BY MOUTH TWICE DAILY IF NEEDED FOR FEVER BLISTER      Musculoskeletal: Strength & Muscle Tone: within normal limits Gait & Station: normal Patient leans: N/A  Psychiatric Specialty Exam: Physical Exam Vitals and nursing note reviewed.  Constitutional:      Appearance: She is well-developed and well-nourished.  HENT:     Head: Normocephalic and atraumatic.  Eyes:     Conjunctiva/sclera: Conjunctivae normal.     Pupils: Pupils are equal, round, and reactive to light.  Cardiovascular:     Heart sounds: Normal heart sounds.  Pulmonary:     Effort: Pulmonary effort is normal.  Abdominal:     Palpations: Abdomen is soft.  Musculoskeletal:        General: Normal range of motion.     Cervical back: Normal range of motion.  Skin:    General: Skin is warm and dry.  Neurological:     General: No focal deficit present.     Mental Status: She is alert.  Psychiatric:        Attention and Perception: Attention normal.        Mood and Affect: Mood normal.        Speech: Speech normal.        Behavior: Behavior normal.        Thought Content: Thought content normal.        Cognition and Memory: Cognition normal.        Judgment: Judgment normal.     Review of Systems  Constitutional: Negative.   HENT: Positive for mouth sores and sore throat.   Eyes: Negative.   Respiratory: Negative.   Cardiovascular: Negative.   Gastrointestinal: Negative.   Musculoskeletal: Negative.   Skin: Negative.   Neurological: Negative.    Psychiatric/Behavioral: Negative.     Blood pressure 106/67, pulse 75, temperature 98.2 F (36.8 C), temperature source Oral, resp. rate 15, height 5\' 2"  (1.575 m), weight 65.8 kg, SpO2 92 %.Body mass  index is 26.52 kg/m.  General Appearance: Casual  Eye Contact:  Fair  Speech:  Normal Rate  Volume:  Normal  Mood:  Euthymic  Affect:  Congruent  Thought Process:  Goal Directed  Orientation:  Full (Time, Place, and Person)  Thought Content:  Logical  Suicidal Thoughts:  No  Homicidal Thoughts:  No  Memory:  Immediate;   Fair Recent;   Fair Remote;   Fair  Judgement:  Fair  Insight:  Fair  Psychomotor Activity:  Decreased  Concentration:  Concentration: Fair  Recall:  [employer] of Knowledge:  Fair  Language:  Fair  Akathisia:  No  Handed:  Right  AIMS (if indicated):     Assets:  Communication Skills Desire for Improvement Housing Resilience  ADL's:  Intact  Cognition:  WNL  Sleep:        Treatment Plan Summary: Plan 64 year old woman with chronic psychiatric problems is currently denying any acute psychiatric symptoms.  Denies mood symptoms.  Denies suicidal or homicidal thoughts.  Does not present as psychotic or paranoid.  Patient does not require inpatient psychiatric admission.  No indication to change any medication at this point.  She will continue to follow-up with her usual outpatient provider.  Case reviewed with emergency room doctor and TTS.  Disposition: No evidence of imminent risk to self or others at present.   Patient does not meet criteria for psychiatric inpatient admission. Supportive therapy provided about ongoing stressors.  Alethia Berthold, MD 12/09/2020 10:56 AM

## 2020-12-13 LAB — CULTURE, BLOOD (ROUTINE X 2)
Culture: NO GROWTH
Culture: NO GROWTH
Special Requests: ADEQUATE
Special Requests: ADEQUATE

## 2020-12-14 ENCOUNTER — Other Ambulatory Visit: Payer: Self-pay

## 2020-12-14 ENCOUNTER — Emergency Department
Admission: EM | Admit: 2020-12-14 | Discharge: 2020-12-14 | Disposition: A | Discharge status: Home / Self Care | Payer: Medicare Other | Attending: Emergency Medicine | Admitting: Emergency Medicine

## 2020-12-14 ENCOUNTER — Encounter: Payer: Self-pay | Admitting: Emergency Medicine

## 2020-12-14 DIAGNOSIS — R112 Nausea with vomiting, unspecified: Secondary | ICD-10-CM | POA: Insufficient documentation

## 2020-12-14 DIAGNOSIS — Z743 Need for continuous supervision: Secondary | ICD-10-CM | POA: Diagnosis not present

## 2020-12-14 DIAGNOSIS — R110 Nausea: Secondary | ICD-10-CM | POA: Diagnosis not present

## 2020-12-14 DIAGNOSIS — Z5321 Procedure and treatment not carried out due to patient leaving prior to being seen by health care provider: Secondary | ICD-10-CM | POA: Insufficient documentation

## 2020-12-14 DIAGNOSIS — R1111 Vomiting without nausea: Secondary | ICD-10-CM | POA: Diagnosis not present

## 2020-12-14 LAB — COMPREHENSIVE METABOLIC PANEL
ALT: 20 U/L (ref 0–44)
AST: 23 U/L (ref 15–41)
Albumin: 4.6 g/dL (ref 3.5–5.0)
Alkaline Phosphatase: 115 U/L (ref 38–126)
Anion gap: 12 (ref 5–15)
BUN: 7 mg/dL — ABNORMAL LOW (ref 8–23)
CO2: 25 mmol/L (ref 22–32)
Calcium: 10.7 mg/dL — ABNORMAL HIGH (ref 8.9–10.3)
Chloride: 104 mmol/L (ref 98–111)
Creatinine, Ser: 0.81 mg/dL (ref 0.44–1.00)
GFR, Estimated: >60 mL/min (ref >60)
Glucose, Bld: 120 mg/dL — ABNORMAL HIGH (ref 70–99)
Potassium: 3.7 mmol/L (ref 3.5–5.1)
Sodium: 141 mmol/L (ref 135–145)
Total Bilirubin: 0.7 mg/dL (ref 0.3–1.2)
Total Protein: 7.7 g/dL (ref 6.5–8.1)

## 2020-12-14 LAB — CBC
HCT: 48.8 % — ABNORMAL HIGH (ref 36.0–46.0)
Hemoglobin: 17.1 g/dL — ABNORMAL HIGH (ref 12.0–15.0)
MCH: 31.8 pg (ref 26.0–34.0)
MCHC: 35 g/dL (ref 30.0–36.0)
MCV: 90.7 fL (ref 80.0–100.0)
Platelets: 315 10*3/uL (ref 150–400)
RBC: 5.38 MIL/uL — ABNORMAL HIGH (ref 3.87–5.11)
RDW: 13.3 % (ref 11.5–15.5)
WBC: 12.9 10*3/uL — ABNORMAL HIGH (ref 4.0–10.5)
nRBC: 0 % (ref 0.0–0.2)

## 2020-12-14 LAB — LIPASE, BLOOD: Lipase: 45 U/L (ref 11–51)

## 2020-12-14 NOTE — ED Triage Notes (Addendum)
Pt via EMS from home. Pt c/o nausea and vomiting. Pt states that she feel like "her body is burning all over, my body is on fire." Pt also states she think she is dehydrated. Pt was seen last Wednesday for same and states she has only been able to keep down fluids but nothing solid. Pt has a hx of a hysterectomy and appendectomy. Pt is A&Ox4 and NAD.

## 2020-12-16 ENCOUNTER — Telehealth: Payer: Self-pay | Admitting: *Deleted

## 2020-12-16 ENCOUNTER — Encounter: Payer: Self-pay | Admitting: Psychiatry

## 2020-12-16 ENCOUNTER — Encounter: Payer: Self-pay | Admitting: Emergency Medicine

## 2020-12-16 ENCOUNTER — Other Ambulatory Visit: Payer: Self-pay

## 2020-12-16 ENCOUNTER — Emergency Department
Admission: EM | Admit: 2020-12-16 | Discharge: 2020-12-16 | Disposition: A | Discharge status: Other Acute Inpatient Hospital | Payer: Medicare Other | Attending: Emergency Medicine | Admitting: Emergency Medicine

## 2020-12-16 ENCOUNTER — Inpatient Hospital Stay
Admission: AD | Admit: 2020-12-16 | Discharge: 2020-12-29 | DRG: 885 | Disposition: A | Discharge status: Home / Self Care | Payer: Medicare Other | Source: Intra-hospital | Attending: Behavioral Health | Admitting: Behavioral Health

## 2020-12-16 DIAGNOSIS — Z79899 Other long term (current) drug therapy: Secondary | ICD-10-CM | POA: Diagnosis not present

## 2020-12-16 DIAGNOSIS — R45851 Suicidal ideations: Secondary | ICD-10-CM

## 2020-12-16 DIAGNOSIS — F419 Anxiety disorder, unspecified: Secondary | ICD-10-CM | POA: Diagnosis present

## 2020-12-16 DIAGNOSIS — F17210 Nicotine dependence, cigarettes, uncomplicated: Secondary | ICD-10-CM | POA: Diagnosis present

## 2020-12-16 DIAGNOSIS — G20 Parkinson's disease: Secondary | ICD-10-CM | POA: Diagnosis present

## 2020-12-16 DIAGNOSIS — Z79890 Hormone replacement therapy (postmenopausal): Secondary | ICD-10-CM

## 2020-12-16 DIAGNOSIS — Z90710 Acquired absence of both cervix and uterus: Secondary | ICD-10-CM | POA: Diagnosis not present

## 2020-12-16 DIAGNOSIS — Z20822 Contact with and (suspected) exposure to covid-19: Secondary | ICD-10-CM | POA: Diagnosis present

## 2020-12-16 DIAGNOSIS — E039 Hypothyroidism, unspecified: Secondary | ICD-10-CM | POA: Diagnosis present

## 2020-12-16 DIAGNOSIS — Z85828 Personal history of other malignant neoplasm of skin: Secondary | ICD-10-CM | POA: Diagnosis not present

## 2020-12-16 DIAGNOSIS — M545 Low back pain, unspecified: Secondary | ICD-10-CM | POA: Insufficient documentation

## 2020-12-16 DIAGNOSIS — M549 Dorsalgia, unspecified: Secondary | ICD-10-CM | POA: Diagnosis present

## 2020-12-16 DIAGNOSIS — F319 Bipolar disorder, unspecified: Secondary | ICD-10-CM | POA: Diagnosis present

## 2020-12-16 DIAGNOSIS — F315 Bipolar disorder, current episode depressed, severe, with psychotic features: Secondary | ICD-10-CM | POA: Diagnosis present

## 2020-12-16 DIAGNOSIS — L989 Disorder of the skin and subcutaneous tissue, unspecified: Secondary | ICD-10-CM

## 2020-12-16 DIAGNOSIS — G4700 Insomnia, unspecified: Secondary | ICD-10-CM | POA: Diagnosis present

## 2020-12-16 DIAGNOSIS — F22 Delusional disorders: Secondary | ICD-10-CM | POA: Diagnosis present

## 2020-12-16 DIAGNOSIS — R1110 Vomiting, unspecified: Secondary | ICD-10-CM | POA: Insufficient documentation

## 2020-12-16 DIAGNOSIS — K449 Diaphragmatic hernia without obstruction or gangrene: Secondary | ICD-10-CM | POA: Diagnosis present

## 2020-12-16 DIAGNOSIS — K219 Gastro-esophageal reflux disease without esophagitis: Secondary | ICD-10-CM

## 2020-12-16 DIAGNOSIS — E860 Dehydration: Secondary | ICD-10-CM | POA: Diagnosis not present

## 2020-12-16 DIAGNOSIS — K3184 Gastroparesis: Secondary | ICD-10-CM | POA: Diagnosis present

## 2020-12-16 DIAGNOSIS — X789XXA Intentional self-harm by unspecified sharp object, initial encounter: Secondary | ICD-10-CM | POA: Insufficient documentation

## 2020-12-16 DIAGNOSIS — R208 Other disturbances of skin sensation: Secondary | ICD-10-CM | POA: Diagnosis not present

## 2020-12-16 DIAGNOSIS — F310 Bipolar disorder, current episode hypomanic: Secondary | ICD-10-CM | POA: Diagnosis present

## 2020-12-16 LAB — URINALYSIS, COMPLETE (UACMP) WITH MICROSCOPIC
Bacteria, UA: NONE SEEN
Bilirubin Urine: NEGATIVE
Glucose, UA: NEGATIVE mg/dL
Ketones, ur: 5 mg/dL — AB
Nitrite: NEGATIVE
Protein, ur: NEGATIVE mg/dL
Specific Gravity, Urine: 1.012 (ref 1.005–1.030)
pH: 6 (ref 5.0–8.0)

## 2020-12-16 LAB — COMPREHENSIVE METABOLIC PANEL
ALT: 19 U/L (ref 0–44)
AST: 25 U/L (ref 15–41)
Albumin: 4.6 g/dL (ref 3.5–5.0)
Alkaline Phosphatase: 123 U/L (ref 38–126)
Anion gap: 11 (ref 5–15)
BUN: 9 mg/dL (ref 8–23)
CO2: 28 mmol/L (ref 22–32)
Calcium: 10.6 mg/dL — ABNORMAL HIGH (ref 8.9–10.3)
Chloride: 100 mmol/L (ref 98–111)
Creatinine, Ser: 0.79 mg/dL (ref 0.44–1.00)
GFR, Estimated: >60 mL/min (ref >60)
Glucose, Bld: 118 mg/dL — ABNORMAL HIGH (ref 70–99)
Potassium: 3.4 mmol/L — ABNORMAL LOW (ref 3.5–5.1)
Sodium: 139 mmol/L (ref 135–145)
Total Bilirubin: 0.6 mg/dL (ref 0.3–1.2)
Total Protein: 7.9 g/dL (ref 6.5–8.1)

## 2020-12-16 LAB — DIFFERENTIAL
Abs Immature Granulocytes: 0.07 10*3/uL (ref 0.00–0.07)
Basophils Absolute: 0.1 10*3/uL (ref 0.0–0.1)
Basophils Relative: 0 %
Eosinophils Absolute: 0.1 10*3/uL (ref 0.0–0.5)
Eosinophils Relative: 1 %
Immature Granulocytes: 1 %
Lymphocytes Relative: 23 %
Lymphs Abs: 3.1 10*3/uL (ref 0.7–4.0)
Monocytes Absolute: 0.9 10*3/uL (ref 0.1–1.0)
Monocytes Relative: 7 %
Neutro Abs: 9.1 10*3/uL — ABNORMAL HIGH (ref 1.7–7.7)
Neutrophils Relative %: 68 %

## 2020-12-16 LAB — CBC
HCT: 50.2 % — ABNORMAL HIGH (ref 36.0–46.0)
HCT: 49.8 % — ABNORMAL HIGH (ref 36.0–46.0)
Hemoglobin: 17.3 g/dL — ABNORMAL HIGH (ref 12.0–15.0)
Hemoglobin: 17.3 g/dL — ABNORMAL HIGH (ref 12.0–15.0)
MCH: 31.2 pg (ref 26.0–34.0)
MCH: 31.8 pg (ref 26.0–34.0)
MCHC: 34.5 g/dL (ref 30.0–36.0)
MCHC: 34.7 g/dL (ref 30.0–36.0)
MCV: 90.6 fL (ref 80.0–100.0)
MCV: 91.5 fL (ref 80.0–100.0)
Platelets: 319 10*3/uL (ref 150–400)
Platelets: 316 10*3/uL (ref 150–400)
RBC: 5.54 MIL/uL — ABNORMAL HIGH (ref 3.87–5.11)
RBC: 5.44 MIL/uL — ABNORMAL HIGH (ref 3.87–5.11)
RDW: 13.5 % (ref 11.5–15.5)
RDW: 13.6 % (ref 11.5–15.5)
WBC: 13.7 10*3/uL — ABNORMAL HIGH (ref 4.0–10.5)
WBC: 13.4 10*3/uL — ABNORMAL HIGH (ref 4.0–10.5)
nRBC: 0 % (ref 0.0–0.2)
nRBC: 0 % (ref 0.0–0.2)

## 2020-12-16 LAB — URINE DRUG SCREEN, QUALITATIVE (ARMC ONLY)
Amphetamines, Ur Screen: NOT DETECTED
Barbiturates, Ur Screen: NOT DETECTED
Benzodiazepine, Ur Scrn: NOT DETECTED
Cannabinoid 50 Ng, Ur ~~LOC~~: NOT DETECTED
Cocaine Metabolite,Ur ~~LOC~~: NOT DETECTED
MDMA (Ecstasy)Ur Screen: NOT DETECTED
Methadone Scn, Ur: NOT DETECTED
Opiate, Ur Screen: NOT DETECTED
Phencyclidine (PCP) Ur S: NOT DETECTED
Tricyclic, Ur Screen: NOT DETECTED

## 2020-12-16 LAB — RESP PANEL BY RT-PCR (FLU A&B, COVID) ARPGX2
Influenza A by PCR: NEGATIVE
Influenza B by PCR: NEGATIVE
SARS Coronavirus 2 by RT PCR: NEGATIVE

## 2020-12-16 LAB — PHOSPHORUS: Phosphorus: 2 mg/dL — ABNORMAL LOW (ref 2.5–4.6)

## 2020-12-16 LAB — ACETAMINOPHEN LEVEL: Acetaminophen (Tylenol), Serum: <10 ug/mL — ABNORMAL LOW (ref 10–30)

## 2020-12-16 LAB — SALICYLATE LEVEL: Salicylate Lvl: <7 mg/dL — ABNORMAL LOW (ref 7.0–30.0)

## 2020-12-16 LAB — MAGNESIUM: Magnesium: 2.2 mg/dL (ref 1.7–2.4)

## 2020-12-16 LAB — TSH: TSH: 2.07 u[IU]/mL (ref 0.350–4.500)

## 2020-12-16 LAB — ETHANOL: Alcohol, Ethyl (B): <10 mg/dL (ref <10)

## 2020-12-16 MED ORDER — LEVOTHYROXINE SODIUM 88 MCG PO TABS
88.0000 ug | ORAL_TABLET | Freq: Every day | ORAL | Status: DC
  Administered 2020-12-17 – 2020-12-29 (×13): 88 ug via ORAL
  Filled 2020-12-16 (×15): qty 1

## 2020-12-16 MED ORDER — ALBUTEROL SULFATE HFA 108 (90 BASE) MCG/ACT IN AERS
2.0000 | INHALATION_SPRAY | Freq: Four times a day (QID) | RESPIRATORY_TRACT | Status: DC | PRN
  Filled 2020-12-16: qty 6.7

## 2020-12-16 MED ORDER — LEVOTHYROXINE SODIUM 88 MCG PO TABS
88.0000 ug | ORAL_TABLET | Freq: Every day | ORAL | Status: DC
  Filled 2020-12-16: qty 1

## 2020-12-16 MED ORDER — SODIUM CHLORIDE 0.9 % IV BOLUS
1000.0000 mL | Freq: Once | INTRAVENOUS | Status: AC
  Administered 2020-12-16: 12:00:00 1000 mL via INTRAVENOUS

## 2020-12-16 MED ORDER — MAGNESIUM HYDROXIDE 400 MG/5ML PO SUSP: 30.0000 mL | Freq: Every day | ORAL | Status: DC | PRN

## 2020-12-16 MED ORDER — TRAZODONE HCL 100 MG PO TABS
100.0000 mg | ORAL_TABLET | Freq: Every day | ORAL | Status: DC
  Administered 2020-12-16 – 2020-12-22 (×7): 100 mg via ORAL
  Filled 2020-12-16 (×7): qty 1

## 2020-12-16 MED ORDER — PANTOPRAZOLE SODIUM 40 MG PO TBEC
40.0000 mg | DELAYED_RELEASE_TABLET | Freq: Every day | ORAL | Status: DC
  Administered 2020-12-16: 17:00:00 40 mg via ORAL
  Filled 2020-12-16: qty 1

## 2020-12-16 MED ORDER — PANTOPRAZOLE SODIUM 40 MG PO TBEC
40.0000 mg | DELAYED_RELEASE_TABLET | Freq: Every day | ORAL | Status: DC
  Administered 2020-12-17 – 2020-12-29 (×13): 40 mg via ORAL
  Filled 2020-12-16 (×13): qty 1

## 2020-12-16 MED ORDER — ALUM & MAG HYDROXIDE-SIMETH 200-200-20 MG/5ML PO SUSP
30.0000 mL | ORAL | Status: DC | PRN
  Administered 2020-12-26 – 2020-12-28 (×4): 30 mL via ORAL
  Filled 2020-12-16 (×4): qty 30

## 2020-12-16 MED ORDER — PREGABALIN 25 MG PO CAPS
25.0000 mg | ORAL_CAPSULE | Freq: Two times a day (BID) | ORAL | Status: DC
  Administered 2020-12-16: 19:00:00 25 mg via ORAL
  Filled 2020-12-16: qty 1

## 2020-12-16 MED ORDER — OLANZAPINE 5 MG PO TABS
15.0000 mg | ORAL_TABLET | Freq: Every day | ORAL | Status: DC
  Administered 2020-12-16 – 2020-12-20 (×5): 15 mg via ORAL
  Filled 2020-12-16 (×5): qty 1

## 2020-12-16 MED ORDER — TRAZODONE HCL 100 MG PO TABS: 100.0000 mg | ORAL_TABLET | Freq: Every day | ORAL | Status: DC

## 2020-12-16 MED ORDER — ACETAMINOPHEN 325 MG PO TABS
650.0000 mg | ORAL_TABLET | Freq: Four times a day (QID) | ORAL | Status: DC | PRN
  Administered 2020-12-17: 650 mg via ORAL
  Filled 2020-12-16: qty 2

## 2020-12-16 MED ORDER — OLANZAPINE 15 MG PO TBDP: 15.0000 mg | ORAL_TABLET | Freq: Every day | ORAL | 0 refills | Status: DC

## 2020-12-16 MED ORDER — GABAPENTIN 300 MG PO CAPS: 300.0000 mg | ORAL_CAPSULE | Freq: Three times a day (TID) | ORAL | 0 refills | Status: DC | PRN

## 2020-12-16 MED ORDER — OLANZAPINE 5 MG PO TABS: 15.0000 mg | ORAL_TABLET | Freq: Every day | ORAL | Status: DC

## 2020-12-16 NOTE — ED Notes (Signed)
Patient is tearful, declined another warm blanket, states she feels like her skin is "rolling off her body" due to heat. Patient states her daughter called the police to transport her to the ED. Patient states she needs to have an IV and some Ativan and fluids to rehydrate her body and that "they gave me an IV and Ativan last Wednesday when I was here. I need to be IVC'd or something." Patient repositions self on stretcher easily.

## 2020-12-16 NOTE — ED Notes (Signed)
IVC 

## 2020-12-16 NOTE — ED Notes (Signed)
Hourly rounding completed at this time, patient currently awake in hallway bed. No complaints, stable, and in no acute distress. Q15 minute rounds and monitoring via Rover and Officer to continue. 

## 2020-12-16 NOTE — Telephone Encounter (Signed)
I saw the ER note and I will await the inpatient progress notes.

## 2020-12-16 NOTE — ED Triage Notes (Signed)
Pt in via POV with multiple complaints, states, "My skin feels like its burning off the bone, it feels like its on fire.  I work hard to try to hydrate myself but my body would only let me take in so much."  Reports being unable to eat solid foods for days.  States, "There is something physically going on, your body shouldn't feel like its in the pits of hell."  Rambling in triage.   Vitals WDL, NAD noted at this time.

## 2020-12-16 NOTE — ED Provider Notes (Signed)
Procedures     ----------------------------------------- 6:40 PM on 12/16/2020 ----------------------------------------- Patient is planned for psychiatry admission currently.  Dr. Weber Cooks intended to start the patient on gabapentin for suspected neuropathic pain, but patient has a recorded allergy to gabapentin.  In reviewing this with the patient, she has poor recollection but agrees with what is documented in the EMR that it may have caused throat swelling.  In this case gabapentin may be dangerous.  She reports tolerating Lyrica in the past, and although it is listed in her allergies there is no specific reaction documented.  I will start her on a low-dose of Lyrica 25 mg twice daily which can be escalated if she is not having any allergic reaction     Carrie Mew, MD 12/16/20 [phone]

## 2020-12-16 NOTE — Consult Note (Signed)
[facility] Consult   Reason for Consult: Consult for 64 year old woman with a history of bipolar disorder who comes to the hospital complaining of burning pain in her skin Referring Physician: Joni Fears Patient Identification: Leslie Wong MRN:  [id number] Principal Diagnosis: Bipolar affective disorder, current episode hypomanic ([place]) Diagnosis:  Principal Problem:   Bipolar affective disorder, current episode hypomanic ([place]) Active Problems:   Hypothyroidism   Chronic back pain   Total Time spent with patient: 1 hour  Subjective:   Leslie Wong is a 64 y.o. female patient admitted with "I cannot stand it".  HPI: Patient seen chart reviewed.  Spoke with the patient and her daughter.  7 year old woman continues to complain of feeling that her skin is burning and dry all over.  This is her third time coming into the emergency room in the last several days with the same somatic complaint.  Physical work-up so far has not found any specific abnormality.  What is different this time is that the patient scratched herself on the wrist a couple days ago because she felt so overwhelmed by this sensation that she thought that she wanted to die.  Patient is on the fence about whether she wants to kill her self at this point.  Denies homicidal ideation.  Denies any acute psychotic symptoms.  Has not been sleeping or eating or drinking for the last couple days.  In her interaction today she does not seem to be disorganized or psychotic.  Past Psychiatric History: Patient has a long history of bipolar disorder with multiple hospitalizations.  Also chronic pain issues.  Risk to Self:   Risk to Others:   Prior Inpatient Therapy:   Prior Outpatient Therapy:    Past Medical History:  Past Medical History:  Diagnosis Date  . Back pain   . Cancer ([place])    skin  . Depression    BAD  . Gastroparesis   . GERD (gastroesophageal reflux disease)   . Hypothyroidism    . Insomnia   . Migraines   . Parkinson disease ([place])    per [facility], dx'd 2019  . Recurrent cold sores   . Shoulder bursitis   . Urge incontinence     Past Surgical History:  Procedure Laterality Date  . ABDOMINAL HYSTERECTOMY  1999   total  . APPENDECTOMY    . ESOPHAGOGASTRODUODENOSCOPY  01/2006   negative except small hiatal hernia  . ESOPHAGOGASTRODUODENOSCOPY  02/24/2015   See report  . LAPAROSCOPY     for endometriosis  . OVARIAN CYST REMOVAL  1990   unilateral  . TONSILLECTOMY AND ADENOIDECTOMY     Family History:  Family History  Problem Relation Age of Onset  . Hypertension Mother   . Arthritis Mother   . Diabetes Mother   . Stroke Mother   . Cancer Father        lung  . Colon cancer Neg Hx   . Breast cancer Neg Hx    Family Psychiatric  History: See previous.  Nothing specific reported Social History:  Social History   Substance and Sexual Activity  Alcohol Use No  . Alcohol/week: 0.0 standard drinks     Social History   Substance and Sexual Activity  Drug Use No    Social History   Socioeconomic History  . Marital status: Divorced    Spouse name: Not on file  . Number of children: Not on file  . Years of education: Not on file  . Highest  education level: Not on file  Occupational History  . Not on file  Tobacco Use  . Smoking status: Current Some Day Smoker    Packs/day: 1.00    Years: 42.50    Pack years: 42.50    Types: Cigarettes  . Smokeless tobacco: Former Systems developer  . Tobacco comment: declined  Vaping Use  . Vaping Use: Never used  Substance and Sexual Activity  . Alcohol use: No    Alcohol/week: 0.0 standard drinks  . Drug use: No  . Sexual activity: Never  Other Topics Concern  . Not on file  Social History Narrative   Lives alone.     Has a dog names Dixie   Social Determinants of Health   Financial Resource Strain: Not on file  Food Insecurity: Not on file  Transportation Needs: Not on file  Physical Activity:  Not on file  Stress: Not on file  Social Connections: Not on file   Additional Social History:    Allergies:   Allergies  Allergen Reactions  . Doxepin Other (See Comments)    Pt reports "that made me have seizures"  . Valproic Acid Shortness Of Breath  . Aspirin     REACTION: UNSPECIFIED  . Atorvastatin Other (See Comments)    Tongue swelling, legs cramping  . Baclofen     REACTION: Throat swells up  . Codeine     REACTION: UNSPECIFIED  . Duloxetine Other (See Comments)    Vision loss, weight loss  . Erythromycin     REACTION: UNSPECIFIED  . Gabapentin     REACTION: throat swells up  . Metoclopramide Hcl     REACTION: states "messed me up"  . Nortriptyline     Other reaction(s): Other (See Comments) Vision issues  . Nsaids Other (See Comments)    Stomach problems  . Nucynta Er [Tapentadol Hcl Er]     Intolerant, see note from 07/24/12.    Gean [place]ch[place] [Tapentadol]     AMS  . Prednisone     GI intolance  . Pregabalin   . Seroquel [Quetiapine Fumerate] Other (See Comments)    Loss control, felt like on another planet  . Sulfa Antibiotics Nausea And Vomiting  . Topamax Other (See Comments)    Blisters in mouth and tongue  . Vicodin [Hydrocodone-Acetaminophen]     Nausea, thrush and constipation    Labs:  Results for orders placed or performed during the hospital encounter of 12/16/20 (from the past 48 hour(s))  Comprehensive metabolic panel     Status: Abnormal   Collection Time: 12/16/20 10:51 AM  Result Value Ref Range   Sodium 139 135 - 145 mmol/L   Potassium 3.4 (L) 3.5 - 5.1 mmol/L   Chloride 100 98 - 111 mmol/L   CO2 28 22 - 32 mmol/L   Glucose, Bld 118 (H) 70 - 99 mg/dL    Comment: Glucose reference range applies only to samples taken after fasting for at least 8 hours.   BUN 9 8 - 23 mg/dL   Creatinine, Ser 0.79 0.44 - 1.00 mg/dL   Calcium 10.6 (H) 8.9 - 10.3 mg/dL   Total Protein 7.9 6.5 - 8.1 g/dL   Albumin 4.6 3.5 - 5.0 g/dL   AST 25 15 - 41 U/L    ALT 19 0 - 44 U/L   Alkaline Phosphatase 123 38 - 126 U/L   Total Bilirubin 0.6 0.3 - 1.2 mg/dL   GFR, Estimated >60 >60 mL/min    Comment: (NOTE)  Calculated using the CKD-EPI Creatinine Equation (2021)    Anion gap 11 5 - 15    Comment: Performed at [facility], [address] [place], [place] [address]  Ethanol     Status: None   Collection Time: 12/16/20 10:51 AM  Result Value Ref Range   Alcohol, Ethyl (B) <10 <10 mg/dL    Comment: (NOTE) Lowest detectable limit for serum alcohol is 10 mg/dL.  For medical purposes only. Performed at [facility], [address] [place], [place] [address]   Salicylate level     Status: Abnormal   Collection Time: 12/16/20 10:51 AM  Result Value Ref Range   Salicylate Lvl <2.3 (L) 7.0 - 30.0 mg/dL    Comment: Performed at [facility], [address] [place], [place] [address]  Acetaminophen level     Status: Abnormal   Collection Time: 12/16/20 10:51 AM  Result Value Ref Range   Acetaminophen (Tylenol), Serum <10 (L) 10 - 30 ug/mL    Comment: (NOTE) Therapeutic concentrations vary significantly. A range of 10-30 ug/mL  may be an effective concentration for many patients. However, some  are best treated at concentrations outside of this range. Acetaminophen concentrations >150 ug/mL at 4 hours after ingestion  and >50 ug/mL at 12 hours after ingestion are often associated with  toxic reactions.  Performed at [facility], [address] [place], [place] [address]   cbc     Status: Abnormal   Collection Time: 12/16/20 10:51 AM  Result Value Ref Range   WBC 13.7 (H) 4.0 - 10.5 K/uL   RBC 5.54 (H) 3.87 - 5.11 MIL/uL   Hemoglobin 17.3 (H) 12.0 - 15.0 g/dL   HCT 50.2 (H) 36.0 - 46.0 %   MCV 90.6 80.0 - 100.0 fL   MCH 31.2 26.0 - 34.0 pg   MCHC 34.5 30.0 - 36.0 g/dL   RDW 13.5 11.5 - 15.5 %   Platelets 319 150 - 400 K/uL   nRBC 0.0 0.0 - 0.2 %    Comment: Performed at [facility], [address], [place], [place] [address]  Magnesium     Status: None   Collection Time: 12/16/20 10:51 AM  Result Value Ref Range   Magnesium 2.2 1.7 - 2.4 mg/dL    Comment: Performed at [facility], [address] [place], [place] [address]  Phosphorus     Status: Abnormal   Collection Time: 12/16/20 10:51 AM  Result Value Ref Range   Phosphorus 2.0 (L) 2.5 - 4.6 mg/dL    Comment: Performed at [facility], [address] [place], [place] [address]  TSH     Status: None   Collection Time: 12/16/20 10:51 AM  Result Value Ref Range   TSH 2.070 0.350 - 4.500 uIU/mL    Comment: Performed by a 3rd Generation assay with a functional sensitivity of <=0.01 uIU/mL. Performed at [facility], [address] [place], [place] [address]   Differential     Status: Abnormal   Collection Time: 12/16/20 10:51 AM  Result Value Ref Range   Neutrophils Relative % 68 %   Neutro Abs 9.1 (H) 1.7 - 7.7 K/uL   Lymphocytes Relative 23 %   Lymphs Abs 3.1 0.7 - 4.0 K/uL   Monocytes Relative 7 %   Monocytes Absolute 0.9 0.1 - 1.0 K/uL   Eosinophils Relative 1 %   Eosinophils Absolute 0.1 0.0 - 0.5 K/uL   Basophils Relative 0 %  Basophils Absolute 0.1 0.0 - 0.1 K/uL   Immature Granulocytes 1 %   Abs Immature Granulocytes 0.07 0.00 - 0.07 K/uL    Comment: Performed at [facility], [address] [place], [place] [address]  CBC     Status: Abnormal   Collection Time: 12/16/20 10:51 AM  Result Value Ref Range   WBC 13.4 (H) 4.0 - 10.5 K/uL   RBC 5.44 (H) 3.87 - 5.11 MIL/uL   Hemoglobin 17.3 (H) 12.0 - 15.0 g/dL   HCT 49.8 (H) 36.0 - 46.0 %   MCV 91.5 80.0 - 100.0 fL   MCH 31.8 26.0 - 34.0 pg   MCHC 34.7 30.0 - 36.0 g/dL   RDW 13.6 11.5 - 15.5 %   Platelets 316 150 - 400 K/uL   nRBC 0.0 0.0 - 0.2 %    Comment: Performed at [facility], [address] [place], [place] [address]  Urine Drug Screen, Qualitative     Status: None    Collection Time: 12/16/20  2:06 PM  Result Value Ref Range   Tricyclic, Ur Screen NONE DETECTED NONE DETECTED   Amphetamines, Ur Screen NONE DETECTED NONE DETECTED   MDMA (Ecstasy)Ur Screen NONE DETECTED NONE DETECTED   Cocaine Metabolite,Ur [place] NONE DETECTED NONE DETECTED   Opiate, Ur Screen NONE DETECTED NONE DETECTED   Phencyclidine (PCP) Ur S NONE DETECTED NONE DETECTED   Cannabinoid 50 Ng, Ur [place] NONE DETECTED NONE DETECTED   Barbiturates, Ur Screen NONE DETECTED NONE DETECTED   Benzodiazepine, Ur Scrn NONE DETECTED NONE DETECTED   Methadone Scn, Ur NONE DETECTED NONE DETECTED    Comment: (NOTE) Tricyclics + metabolites, urine    Cutoff 1000 ng/mL Amphetamines + metabolites, urine  Cutoff 1000 ng/mL MDMA (Ecstasy), urine              Cutoff 500 ng/mL Cocaine Metabolite, urine          Cutoff 300 ng/mL Opiate + metabolites, urine        Cutoff 300 ng/mL Phencyclidine (PCP), urine         Cutoff 25 ng/mL Cannabinoid, urine                 Cutoff 50 ng/mL Barbiturates + metabolites, urine  Cutoff 200 ng/mL Benzodiazepine, urine              Cutoff 200 ng/mL Methadone, urine                   Cutoff 300 ng/mL  The urine drug screen provides only a preliminary, unconfirmed analytical test result and should not be used for non-medical purposes. Clinical consideration and professional judgment should be applied to any positive drug screen result due to possible interfering substances. A more specific alternate chemical method must be used in order to obtain a confirmed analytical result. Gas chromatography / mass spectrometry (GC/MS) is the preferred confirm atory method. Performed at [facility], [address] [place], [place] [address]   Resp Panel by RT-PCR (Flu A&B, Covid) Nasopharyngeal Swab     Status: None   Collection Time: 12/16/20  2:06 PM   Specimen: Nasopharyngeal Swab; Nasopharyngeal(NP) swabs in vial transport medium  Result Value Ref Range   SARS  Coronavirus 2 by RT PCR NEGATIVE NEGATIVE    Comment: (NOTE) SARS-CoV-2 target nucleic acids are NOT DETECTED.  The SARS-CoV-2 RNA is generally detectable in upper respiratory specimens during the acute phase of infection. The lowest concentration of SARS-CoV-2 viral  copies this assay can detect is 138 copies/mL. A negative result does not preclude SARS-Cov-2 infection and should not be used as the sole basis for treatment or other patient management decisions. A negative result may occur with  improper specimen collection/handling, submission of specimen other than nasopharyngeal swab, presence of viral mutation(s) within the areas targeted by this assay, and inadequate number of viral copies(<138 copies/mL). A negative result must be combined with clinical observations, patient history, and epidemiological information. The expected result is Negative.  Fact Sheet for Patients:  EntrepreneurPulse.com.au  Fact Sheet for Healthcare Providers:  IncredibleEmployment.be  This test is no t yet approved or cleared by the [place] FDA and  has been authorized for detection and/or diagnosis of SARS-CoV-2 by FDA under an Emergency Use Authorization (EUA). This EUA will remain  in effect (meaning this test can be used) for the duration of the COVID-19 declaration under Section 564(b)(1) of the Act, 21 U.S.C.section 360bbb-3(b)(1), unless the authorization is terminated  or revoked sooner.       Influenza A by PCR NEGATIVE NEGATIVE   Influenza B by PCR NEGATIVE NEGATIVE    Comment: (NOTE) The Xpert Xpress SARS-CoV-2/FLU/RSV plus assay is intended as an aid in the diagnosis of influenza from Nasopharyngeal swab specimens and should not be used as a sole basis for treatment. Nasal washings and aspirates are unacceptable for Xpert Xpress SARS-CoV-2/FLU/RSV testing.  Fact Sheet for Patients: EntrepreneurPulse.com.au  Fact Sheet  for Healthcare Providers: IncredibleEmployment.be  This test is not yet approved or cleared by the [place] FDA and has been authorized for detection and/or diagnosis of SARS-CoV-2 by FDA under an Emergency Use Authorization (EUA). This EUA will remain in effect (meaning this test can be used) for the duration of the COVID-19 declaration under Section 564(b)(1) of the Act, 21 U.S.C. section 360bbb-3(b)(1), unless the authorization is terminated or revoked.  Performed at [facility], [address] [place], [place] [address]   Urinalysis, Complete w Microscopic Nasopharyngeal Swab     Status: Abnormal   Collection Time: 12/16/20  2:06 PM  Result Value Ref Range   Color, Urine YELLOW (A) YELLOW   APPearance HAZY (A) CLEAR   Specific Gravity, Urine 1.012 1.005 - 1.030   pH 6.0 5.0 - 8.0   Glucose, UA NEGATIVE NEGATIVE mg/dL   Hgb urine dipstick MODERATE (A) NEGATIVE   Bilirubin Urine NEGATIVE NEGATIVE   Ketones, ur 5 (A) NEGATIVE mg/dL   Protein, ur NEGATIVE NEGATIVE mg/dL   Nitrite NEGATIVE NEGATIVE   Leukocytes,Ua TRACE (A) NEGATIVE   RBC / HPF 6-10 0 - 5 RBC/hpf   WBC, UA 11-20 0 - 5 WBC/hpf   Bacteria, UA NONE SEEN NONE SEEN   Squamous Epithelial / LPF 0-5 0 - 5   Mucus PRESENT     Comment: Performed at [facility], [address] [place],  [address]    No current facility-administered medications for this encounter.   Current Outpatient Medications  Medication Sig Dispense Refill  . albuterol (VENTOLIN HFA) 108 (90 Base) MCG/ACT inhaler Inhale 2 puffs into the lungs every 6 (six) hours as needed for wheezing or shortness of breath. 18 g 11  . gabapentin (NEURONTIN) 300 MG capsule Take 1 capsule (300 mg total) by mouth 3 (three) times daily as needed (Skin pain). 90 capsule 0  . levothyroxine (SYNTHROID) 88 MCG tablet Take 1 tablet (88 mcg total) by mouth daily at 6 (six) AM. 90 tablet 1  . olanzapine zydis (  ZYPREXA  ZYDIS) 15 MG disintegrating tablet Take 1 tablet (15 mg total) by mouth at bedtime. 90 tablet 0  . omeprazole (PRILOSEC) 20 MG capsule Take 1 capsule (20 mg total) by mouth 2 (two) times daily before a meal. 180 capsule 1  . traZODone (DESYREL) 100 MG tablet Take 1 tablet (100 mg total) by mouth at bedtime. 90 tablet 0  . valACYclovir (VALTREX) 500 MG tablet TAKE 1 TABLET(500 MG) BY MOUTH TWICE DAILY IF NEEDED FOR FEVER BLISTER      Musculoskeletal: Strength & Muscle Tone: within normal limits Gait & Station: normal Patient leans: N/A  Psychiatric Specialty Exam: Physical Exam Vitals and nursing note reviewed.  Constitutional:      Appearance: She is well-developed and well-nourished.  HENT:     Head: Normocephalic and atraumatic.  Eyes:     Conjunctiva/sclera: Conjunctivae normal.     Pupils: Pupils are equal, round, and reactive to light.  Cardiovascular:     Heart sounds: Normal heart sounds.  Pulmonary:     Effort: Pulmonary effort is normal.  Abdominal:     Palpations: Abdomen is soft.  Musculoskeletal:        General: Normal range of motion.     Cervical back: Normal range of motion.  Skin:    General: Skin is warm and dry.  Neurological:     General: No focal deficit present.     Mental Status: She is alert.  Psychiatric:        Attention and Perception: Attention normal.        Mood and Affect: Mood is anxious.        Speech: Speech normal.        Behavior: Behavior is cooperative.        Thought Content: Thought content is not paranoid. Thought content does not include homicidal or suicidal ideation.        Cognition and Memory: Cognition normal.        Judgment: Judgment is impulsive.     Review of Systems  Constitutional: Negative.   HENT: Negative.   Eyes: Negative.   Respiratory: Negative.   Cardiovascular: Negative.   Gastrointestinal: Negative.   Musculoskeletal: Negative.   Skin:       She complains of feeling that her skin is burning all over   Neurological: Negative.   Psychiatric/Behavioral: Positive for dysphoric mood and sleep disturbance. The patient is nervous/anxious.     Blood pressure 128/64, pulse 69, temperature 99.2 F (37.3 C), temperature source Oral, resp. rate 18, height 4\' 11"  (1.499 m), weight 36.3 kg, SpO2 98 %.Body mass index is 16.16 kg/m.  General Appearance: Casual  Eye Contact:  Fair  Speech:  Clear and Coherent  Volume:  Normal  Mood:  Anxious and Dysphoric  Affect:  Congruent  Thought Process:  Coherent  Orientation:  Full (Time, Place, and Person)  Thought Content:  Rumination and Tangential  Suicidal Thoughts:  Yes.  without intent/plan  Homicidal Thoughts:  No  Memory:  Immediate;   Fair Recent;   Fair Remote;   Fair  Judgement:  Impaired  Insight:  Shallow  Psychomotor Activity:  Decreased  Concentration:  Concentration: Fair  Recall:  [employer] of Knowledge:  Fair  Language:  Fair  Akathisia:  No  Handed:  Right  AIMS (if indicated):     Assets:  Communication Skills Desire for Improvement Housing Resilience Social Support  ADL's:  Impaired  Cognition:  WNL  Sleep:  Treatment Plan Summary: Daily contact with patient to assess and evaluate symptoms and progress in treatment, Medication management and Plan 64 year old woman with bipolar disorder presenting with atypical nonphysiologically explainable somatic symptoms.  Also having some passive suicidal ideation and cut her wrist a couple days ago.  Family very concerned about her safety especially given that she lives alone.  Family does not feel comfortable with any plan for discharge.  We will go ahead and plan to admit her to the psychiatric hospital continuing usual outpatient medicines.  Case reviewed with ER physician and TTS  Disposition: Recommend psychiatric Inpatient admission when medically cleared.  Alethia Berthold, MD 12/16/2020 3:36 PM

## 2020-12-16 NOTE — Telephone Encounter (Signed)
Patient called stating that her body feels like it is on fire.Patient stated that she is not able to sweat.  Patient stated that she went to the ER by EMS Monday and they told her that she was not dehydrated and could not do anything for her so she left without seeing a doctor. Patient stated that she is not feeling any better today and has only been able to eat some ice chips. Patient stated that she is sure that she is dehydrated. Advised patient if she feels worse and thinks that she is dehydrated she should go back to the ER and stay to see a doctor. Patient stated that she does not have any way to get there. Patient stated if she went back to the ER she would have to call 911 like she did Monday. Offered to call 911 for patient and she declined. Patient stated that she does not know what she wants to do. Again patient was encouraged to go back to the ER.

## 2020-12-16 NOTE — ED Notes (Signed)
Report received from [place], RN including Situation, Background, Assessment, and Recommendations. Patient alert and oriented, warm and dry, and in no acute distress. Patient denies SI, HI, AVH and pain. Patient made aware of Q15 minute rounds and Engineer, drilling presence for their safety. Patient instructed to come to this nurse with needs or concerns.

## 2020-12-16 NOTE — BH Assessment (Signed)
Comprehensive Clinical Assessment (CCA) Note  12/16/2020 Leslie Wong [id number]  [place] Leslie Wong is a 64 year old female who presents to the ER due to having thoughts of ending her life due to current pain she is experiencing. She further reports, she is unable to eat because she vomits it back up. Patient has been seen in the past for similar presentation and required hospitalization, but it was due to her mental illness. Patient also reports, she cut her wrist several days ago with the intentions of ending her life. She was upset because she didn't know how to deal with the pain and "didn't see a way out." Patient continues to voice SI.  During the interview, patient was calm, cooperative, and pleasant. She was able to provide appropriate answers to the questions. She denies HI and AV/H. Denies use of any mind-altering substance and no history of violence or aggression.  Chief Complaint:  Chief Complaint  Patient presents with   Multiple Complaints   Visit Diagnosis: Bipolar    CCA Screening, Triage and Referral (STR)  Patient Reported Information How did you hear about [name]? Family/Friend  Referral name: Daughter [name]  Referral phone number: [phone]   Whom do you see for routine medical problems? No data recorded Practice/Facility Name: No data recorded Practice/Facility Phone Number: No data recorded Name of Contact: No data recorded Contact Number: No data recorded Contact Fax Number: No data recorded Prescriber Name: No data recorded Prescriber Address (if known): No data recorded  What Is the Reason for Your Visit/Call Today? No data recorded How Long Has This Been Causing You Problems? 1 wk - 1 month  What Do You Feel Would Help You the Most Today? Other (Comment)   Have You Recently Been in Any Inpatient Treatment (Hospital/Detox/Crisis Center/28-Day Program)? No  Name/Location of Program/Hospital:No data recorded How Long Were You  There? No data recorded When Were You Discharged? No data recorded  Have You Ever Received Services From [facility] Before? Yes  Who Do You See at [facility]? ER visits and Inpatient for Mental Health needs   Have You Recently Had Any Thoughts About Hurting Yourself? Yes  Are You Planning to Commit Suicide/Harm Yourself At This time? Yes   Have you Recently Had Thoughts About Hurting Someone Leslie Wong? No  Explanation: No data recorded  Have You Used Any Alcohol or Drugs in the Past 24 Hours? No  How Long Ago Did You Use Drugs or Alcohol? No data recorded What Did You Use and How Much? No data recorded  Do You Currently Have a Therapist/Psychiatrist? Yes  Name of Therapist/Psychiatrist: [place] Recently Discharged From Any Office Practice or Programs? No  Explanation of Discharge From Practice/Program: No data recorded    CCA Screening Triage Referral Assessment Type of Contact: Face-to-Face  Is this Initial or Reassessment? No data recorded Date Telepsych consult ordered in [facility]:  No data recorded Time Telepsych consult ordered in [facility]:  No data recorded  Patient Reported Information Reviewed? Yes  Patient Left Without Being Seen? No data recorded Reason for Not Completing Assessment: No data recorded  Collateral Involvement: None   Does Patient Have a Court Appointed Legal Guardian? No data recorded Name and Contact of Legal Guardian: Self  If Minor and Not Living with Parent(s), Who has Custody? No data recorded Is CPS involved or ever been involved? Never  Is APS involved or ever been involved? Never   Patient Determined To Be At Risk  for Harm To Self or Others Based on Review of Patient Reported Information or Presenting Complaint? Yes, for Self-Harm  Method: No data recorded Availability of Means: No data recorded Intent: No data recorded Notification Required: No data recorded Additional Information for Danger to Others  Potential: No data recorded Additional Comments for Danger to Others Potential: No data recorded Are There Guns or Other Weapons in Your Home? No data recorded Types of Guns/Weapons: No data recorded Are These Weapons Safely Secured?                            No data recorded Who Could Verify You Are Able To Have These Secured: No data recorded Do You Have any Outstanding Charges, Pending Court Dates, Parole/Probation? No data recorded Contacted To Inform of Risk of Harm To Self or Others: No data recorded  Location of Assessment: [facility] ED   Does Patient Present under Involuntary Commitment? Yes  IVC Papers Initial File Date: 12/16/2020   [place] of Residence: [place]   Patient Currently Receiving the Following Services: Medication Management   Determination of Need: Emergent (2 hours)   Options For Referral: Inpatient Hospitalization   CCA Biopsychosocial Intake/Chief Complaint:  Presents to the ER due to having feeling and thoughts her body is on fire. Due to it, she having thoughts of ending her life.  Current Symptoms/Problems: SI   Patient Reported Schizophrenia/Schizoaffective Diagnosis in Past: No   Strengths: Have support from her daughter. Currently receives outpatient treatment  Preferences: None reported  Abilities: Able to care for herself   Type of Services Patient Feels are Needed: Inpatient treatment   Initial Clinical Notes/Concerns: Patient having SI   Mental Health Symptoms Depression:  Increase/decrease in appetite; Hopelessness; Fatigue; Difficulty Concentrating; Tearfulness; Weight gain/loss; Change in energy/activity   Duration of Depressive symptoms: Greater than two weeks   Mania:  Racing thoughts   Anxiety:   Irritability; Fatigue   Psychosis:  Other negative symptoms   Duration of Psychotic symptoms: Less than six months   Trauma:  None   Obsessions:  Cause anxiety; Disrupts routine/functioning   Compulsions:  None    Inattention:  None   Hyperactivity/Impulsivity:  N/A   Oppositional/Defiant Behaviors:  None   Emotional Irregularity:  N/A   Other Mood/Personality Symptoms:  None reported    Mental Status Exam Appearance and self-care  Stature:  Small   Weight:  Thin   Clothing:  Neat/clean   Grooming:  Normal   Cosmetic use:  Age appropriate   Posture/gait:  Normal   Motor activity:  -- (Within normal range)   Sensorium  Attention:  Normal   Concentration:  Preoccupied   Orientation:  X5   Recall/memory:  Normal   Affect and Mood  Affect:  Anxious; Depressed   Mood:  Anxious; Depressed   Relating  Eye contact:  Normal   Facial expression:  Anxious; Depressed   Attitude toward examiner:  Cooperative   Thought and Language  Speech flow: Clear and Coherent   Thought content:  Appropriate to Mood and Circumstances   Preoccupation:  Obsessions   Hallucinations:  None   Organization:  No data recorded  [employer] of Knowledge:  Average   Intelligence:  Average   Abstraction:  Normal   Judgement:  Impaired   Reality Testing:  Adequate   Insight:  Flashes of insight   Decision Making:  Impulsive   Social Functioning  Social Maturity:  Impulsive (Due to recent cutting on her arm)   Social Judgement:  No data recorded  Stress  Stressors:  Illness; Other (Comment)   Coping Ability:  Exhausted; Overwhelmed   Skill Deficits:  None   Supports:  Family     Religion: Religion/Spirituality Are You A Religious Person?: No How Might This Affect Treatment?: None reported  Leisure/Recreation: Leisure / Recreation Do You Have Hobbies?: No  Exercise/Diet: Exercise/Diet Do You Exercise?: No Have You Gained or Lost A Significant Amount of Weight in the Past Six Months?: Yes-Lost Number of Pounds Lost?:  (Unknown) Do You Follow a Special Diet?: No Do You Have Any Trouble Sleeping?: Yes Explanation of Sleeping Difficulties: Due to pain  in her body   CCA Employment/Education Employment/Work Situation: Employment / Work Situation Employment situation: Unemployed Patient's job has been impacted by current illness: No What is the longest time patient has a held a job?: Unknown Where was the patient employed at that time?: Unknown Has patient ever been in the [employer]?: No  Education: Education Is Patient Currently Attending School?: No Last Grade Completed:  (Unknown) Name of High School: Unknown Did Garment/textile technologist From [employer]?:  (Unknown) Did You Attend College?:  (Unknown) Did You Attend Graduate School?:  (Unknown) Did You Have Any Special Interests In School?: None reported Did You Have An Individualized Education Program (IIEP): No Did You Have Any Difficulty At School?:  (Unknown) Patient's Education Has Been Impacted by Current Illness: No   CCA Family/Childhood History Family and Relationship History: Family history Marital status: Single Are you sexually active?: No What is your sexual orientation?: Unknown Has your sexual activity been affected by drugs, alcohol, medication, or emotional stress?: Unknown Does patient have children?: Yes How many children?: 1 How is patient's relationship with their children?: Reports that it's good  Childhood History:  Childhood History By whom was/is the patient raised?: Mother Additional childhood history information: None reported Description of patient's relationship with caregiver when they were a child: None reported Patient's description of current relationship with people who raised him/her: None reported How were you disciplined when you got in trouble as a child/adolescent?: None reported Does patient have siblings?:  (Unknown) Did patient suffer any verbal/emotional/physical/sexual abuse as a child?: No Did patient suffer from severe childhood neglect?: No Has patient ever been sexually abused/assaulted/raped as an adolescent or adult?: No Was the  patient ever a victim of a crime or a disaster?: No Witnessed domestic violence?: No Has patient been affected by domestic violence as an adult?: No  Child/Adolescent Assessment:     CCA Substance Use Alcohol/Drug Use: Alcohol / Drug Use Pain Medications: See PTA Prescriptions: See PTA Over the Counter: See PTA History of alcohol / drug use?: No history of alcohol / drug abuse Longest period of sobriety (when/how long): n/a                         ASAM's:  Six Dimensions of Multidimensional Assessment  Dimension 1:  Acute Intoxication and/or Withdrawal Potential:      Dimension 2:  Biomedical Conditions and Complications:      Dimension 3:  Emotional, Behavioral, or Cognitive Conditions and Complications:     Dimension 4:  Readiness to Change:     Dimension 5:  Relapse, Continued use, or Continued Problem Potential:     Dimension 6:  Recovery/Living Environment:     ASAM Severity Score:    ASAM Recommended Level of Treatment:  Substance use Disorder (SUD)    Recommendations for Services/Supports/Treatments:    DSM5 Diagnoses: Patient Active Problem List   Diagnosis Date Noted   Bipolar disorder in remission ([place]) 12/09/2020   Pulmonary nodule 06/01/2020   Bipolar 1 disorder ([place]) 03/24/2020   Bipolar I disorder, most recent episode depressed ([place]) 01/07/2020   MDD (major depressive disorder) 01/02/2020   Depression 12/11/2019   Paranoia ([place])    Cannabis abuse 05/28/2019   Bipolar 1 disorder, mixed, severe ([place]) 05/27/2019   Opiate dependence ([place]) 04/25/2019   Cracking skin 01/27/2019   Delusion ([place]) 09/26/2018   Night sweats 05/13/2018   Pupil asymmetry 05/13/2018   DDD (degenerative disc disease), cervical 04/23/2018   Parkinson disease ([place])    HLD (hyperlipidemia) 03/10/2016   Advance care planning 03/10/2016   Cervical radiculopathy 07/30/2015   Osteopenia 04/18/2014   Medicare annual wellness visit, subsequent  03/18/2014   Shoulder pain 01/17/2013   Chronic back pain 09/14/2011   Hypercalcemia 03/25/2010   INCONTINENCE, URGE 04/16/2009   CARPAL TUNNEL SYNDROME, BILATERAL 09/24/2008   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   Hypothyroidism 06/05/2007   Bipolar affective disorder, current episode hypomanic ([place]) 06/05/2007   MIGRAINE HEADACHE 06/05/2007   Gastroparesis 06/05/2007   Gastroesophageal reflux disease with hiatal hernia 06/05/2007   Irritable bowel syndrome 06/05/2007   FIBROCYSTIC BREAST DISEASE 06/05/2007    Patient Centered Plan: Patient is on the following Treatment Plan(s):     Referrals to Alternative Service(s): Referred to Alternative Service(s):   Place:   Date:   Time:    Referred to Alternative Service(s):   Place:   Date:   Time:    Referred to Alternative Service(s):   Place:   Date:   Time:    Referred to Alternative Service(s):   Place:   Date:   Time:     Gunnar Fusi MS, LCAS, [facility], [facility] Therapeutic Triage Specialist 12/16/2020 5:02 PM

## 2020-12-16 NOTE — ED Notes (Signed)
Psych and TTS at bedside. 

## 2020-12-16 NOTE — Telephone Encounter (Signed)
I am not in the office today to see the patient.  If she has urgent issues then getting checked either at urgent care or the emergency room would be reasonable.  I think it makes sense to offer outpatient follow-up when possible.  Thank you for giving her routine emergency room cautions.

## 2020-12-16 NOTE — Tx Team (Signed)
Initial Treatment Plan 12/16/2020 11:19 PM Leslie Wong [id number]    PATIENT STRESSORS: Other: Pain, burning sensation over entire body   PATIENT STRENGTHS: Capable of independent living General fund of knowledge Supportive family/friends    PATIENT IDENTIFIED PROBLEMS: Uncontrolled pain  Anxiety                   DISCHARGE CRITERIA:  Ability to meet basic life and health needs Improved stabilization in mood, thinking, and/or behavior  PRELIMINARY DISCHARGE PLAN: Outpatient therapy Return to previous living arrangement  PATIENT/FAMILY INVOLVEMENT: This treatment plan has been presented to and reviewed with the patient, Leslie Wong. The patient has been given the opportunity to ask questions and make suggestions.  Sunday Spillers, RN 12/16/2020, 11:19 PM

## 2020-12-16 NOTE — ED Notes (Addendum)
Pt dressed into burgundy scrubs by this tech and Eman, EDT. Pt belongings include: pink long sleeve shirt, black leggings, underwear, pair of socks, pair of shoes, phone, brown purse, and miscellaneous items in purse. Belongings appropriately labeled and placed in one belongings bag.

## 2020-12-16 NOTE — Telephone Encounter (Signed)
Attempted to call number provided, but no answer. Called patients daughter [name] (ok per DPR) who stated that her mother was currently in the ED. Daughter stated that she had to have her mother IVC'd because she tried to commit suicide by cutting her wrist. Daughter stated that her mother has went to the hospital three times today because the doctors continued to release her. Patients daughter stated that she called Patient Advocacy in order to have her mother admitted and IVC'd.

## 2020-12-16 NOTE — ED Notes (Signed)
Pt given crackers and wants to try to eat. OK per EDP.

## 2020-12-16 NOTE — ED Provider Notes (Signed)
[facility] Emergency Department Provider Note ____________________________________________   Event Date/Time   First MD Initiated Contact with Patient 12/16/20 1117     (approximate)  I have reviewed the triage vital signs and the nursing notes.   HISTORY  Chief Complaint Multiple Complaints    HPI Leslie Wong is a 64 y.o. female with PMH as noted below who presents with a primary complaint of a burning sensation in her entire body.  She states that this has been persistent for the last several days although somewhat present for a few weeks.  She reports this sensation in her skin over her entire body especially her hands and face.  Nothing makes it better or worse.  She states it is the worst pain she has ever had.  She takes oxycodone 10 mg for chronic back pain and states that it is not improving this burning sensation.  In addition, the patient reports difficulty eating and drinking and feels that she is extremely dehydrated.  The patient states that she has great difficulty holding down any solids, and can only drink a small amount of liquids.  She states that she cannot keep up and is very dehydrated.  The patient states that she cannot sweat because how dehydrated she is.  She also states she makes very little urine and has not had a bowel movement in some time.  The patient also states that she would like to be involuntarily committed.  She reports that she harmed herself by cutting her wrist because she cannot take the pain anymore.  Past Medical History:  Diagnosis Date  . Back pain   . Cancer ([place])    skin  . Depression    BAD  . Gastroparesis   . GERD (gastroesophageal reflux disease)   . Hypothyroidism   . Insomnia   . Migraines   . Parkinson disease ([place])    per [facility], dx'd 2019  . Recurrent cold sores   . Shoulder bursitis   . Urge incontinence     Patient Active Problem List   Diagnosis Date Noted  . Bipolar  disorder in remission ([place]) 12/09/2020  . Pulmonary nodule 06/01/2020  . Bipolar 1 disorder ([place]) 03/24/2020  . Bipolar I disorder, most recent episode depressed ([employer]) 01/07/2020  . MDD (major depressive disorder) 01/02/2020  . Depression 12/11/2019  . Paranoia ([place])   . Cannabis abuse 05/28/2019  . Bipolar 1 disorder, mixed, severe ([place]) 05/27/2019  . Opiate dependence ([place]) 04/25/2019  . Cracking skin 01/27/2019  . Delusion ([place]) 09/26/2018  . Night sweats 05/13/2018  . Pupil asymmetry 05/13/2018  . DDD (degenerative disc disease), cervical 04/23/2018  . Parkinson disease ([place])   . HLD (hyperlipidemia) 03/10/2016  . Advance care planning 03/10/2016  . Cervical radiculopathy 07/30/2015  . Osteopenia 04/18/2014  . Medicare annual wellness visit, subsequent 03/18/2014  . Shoulder pain 01/17/2013  . Chronic back pain 09/14/2011  . Hypercalcemia 03/25/2010  . INCONTINENCE, URGE 04/16/2009  . CARPAL TUNNEL SYNDROME, BILATERAL 09/24/2008  . 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 Hypothyroidism 06/05/2007  . Bipolar affective disorder, current episode hypomanic ([place]) 06/05/2007  . MIGRAINE HEADACHE 06/05/2007  . Gastroparesis 06/05/2007  . Gastroesophageal reflux disease with hiatal hernia 06/05/2007  . Irritable bowel syndrome 06/05/2007  . FIBROCYSTIC BREAST DISEASE 06/05/2007    Past Surgical History:  Procedure Laterality Date  . ABDOMINAL HYSTERECTOMY  1999   total  . APPENDECTOMY    . ESOPHAGOGASTRODUODENOSCOPY  01/2006   negative except  small hiatal hernia  . ESOPHAGOGASTRODUODENOSCOPY  02/24/2015   See report  . LAPAROSCOPY     for endometriosis  . OVARIAN CYST REMOVAL  1990   unilateral  . TONSILLECTOMY AND ADENOIDECTOMY      Prior to Admission medications   Medication Sig Start Date End Date Taking? Authorizing Provider  albuterol (VENTOLIN HFA) 108 (90 Base) MCG/ACT inhaler Inhale 2 puffs into the lungs every 6 (six) hours as needed for wheezing or shortness of  breath. 07/25/19   Tonia Ghent, MD  levothyroxine (SYNTHROID) 88 MCG tablet Take 1 tablet (88 mcg total) by mouth daily at 6 (six) AM. 05/29/20   Tonia Ghent, MD  olanzapine zydis (ZYPREXA ZYDIS) 15 MG disintegrating tablet Take 1 tablet (15 mg total) by mouth at bedtime. 05/29/20   Tonia Ghent, MD  omeprazole (PRILOSEC) 20 MG capsule Take 1 capsule (20 mg total) by mouth 2 (two) times daily before a meal. 05/29/20   Tonia Ghent, MD  traZODone (DESYREL) 100 MG tablet Take 1 tablet (100 mg total) by mouth at bedtime. 05/29/20   Tonia Ghent, MD  valACYclovir (VALTREX) 500 MG tablet TAKE 1 TABLET(500 MG) BY MOUTH TWICE DAILY IF NEEDED FOR FEVER BLISTER 05/29/20   Tonia Ghent, MD    Allergies Doxepin, Valproic acid, Aspirin, Atorvastatin, Baclofen, Codeine, Duloxetine, Erythromycin, Gabapentin, Metoclopramide hcl, Nortriptyline, Nsaids, Nucynta er [tapentadol hcl er], Nucynta [tapentadol], Prednisone, Pregabalin, Seroquel [quetiapine fumerate], Sulfa antibiotics, Topamax, and Vicodin [hydrocodone-acetaminophen]  Family History  Problem Relation Age of Onset  . Hypertension Mother   . Arthritis Mother   . Diabetes Mother   . Stroke Mother   . Cancer Father        lung  . Colon cancer Neg Hx   . Breast cancer Neg Hx     Social History Social History   Tobacco Use  . Smoking status: Current Some Day Smoker    Packs/day: 1.00    Years: 42.50    Pack years: 42.50    Types: Cigarettes  . Smokeless tobacco: Former Systems developer  . Tobacco comment: declined  Vaping Use  . Vaping Use: Never used  Substance Use Topics  . Alcohol use: No    Alcohol/week: 0.0 standard drinks  . Drug use: No    Review of Systems  Constitutional: No fever. Eyes: No redness. ENT: No sore throat. Cardiovascular: Denies chest pain. Respiratory: Denies shortness of breath. Gastrointestinal: No vomiting.  Genitourinary: Negative for dysuria.  Musculoskeletal: Negative for acute back pain. Skin:  Negative for rash. Neurological: Negative for headache.   ____________________________________________   PHYSICAL EXAM:  VITAL SIGNS: ED Triage Vitals  Enc Vitals Group     BP 12/16/20 1035 (!) 146/126     Pulse Rate 12/16/20 1035 99     Resp 12/16/20 1035 (!) 21     Temp 12/16/20 1035 99 F (37.2 C)     Temp Source 12/16/20 1035 Oral     SpO2 12/16/20 1035 97 %     Weight 12/16/20 1038 80 lb (36.3 kg)     Height 12/16/20 1038 4\' 11"  (1.499 m)     Head Circumference --      Peak Flow --      Pain Score 12/16/20 1049 10     Pain Loc --      Pain Edu? --      Excl. in [place]? --     Constitutional: Alert and oriented.  Anxious appearing but in no  acute distress. Eyes: Conjunctivae are normal.  Head: Atraumatic. Nose: No congestion/rhinnorhea. Mouth/Throat: Mucous membranes are moist.   Neck: Normal range of motion.  Cardiovascular: Normal rate, regular rhythm.  Good peripheral circulation. Respiratory: Normal respiratory effort.  No retractions.  Gastrointestinal: Soft and nontender. No distention.  Genitourinary: No flank tenderness. Musculoskeletal: No lower extremity edema.  Extremities warm and well perfused.  Neurologic:  Normal speech and language. No gross focal neurologic deficits are appreciated.  Skin:  Skin is warm and dry. No rash noted.  Several superficial linear abrasions to the left wrist. Psychiatric: Anxious appearing, overall organized speech and thought.  ____________________________________________   LABS (all labs ordered are listed, but only abnormal results are displayed)  Labs Reviewed  COMPREHENSIVE METABOLIC PANEL - Abnormal; Notable for the following components:      Result Value   Potassium 3.4 (*)    Glucose, Bld 118 (*)    Calcium 10.6 (*)    All other components within normal limits  SALICYLATE LEVEL - Abnormal; Notable for the following components:   Salicylate Lvl <3.4 (*)    All other components within normal limits   ACETAMINOPHEN LEVEL - Abnormal; Notable for the following components:   Acetaminophen (Tylenol), Serum <10 (*)    All other components within normal limits  CBC - Abnormal; Notable for the following components:   WBC 13.7 (*)    RBC 5.54 (*)    Hemoglobin 17.3 (*)    HCT 50.2 (*)    All other components within normal limits  URINALYSIS, COMPLETE (UACMP) WITH MICROSCOPIC - Abnormal; Notable for the following components:   Color, Urine YELLOW (*)    APPearance HAZY (*)    Hgb urine dipstick MODERATE (*)    Ketones, ur 5 (*)    Leukocytes,Ua TRACE (*)    All other components within normal limits  PHOSPHORUS - Abnormal; Notable for the following components:   Phosphorus 2.0 (*)    All other components within normal limits  DIFFERENTIAL - Abnormal; Notable for the following components:   Neutro Abs 9.1 (*)    All other components within normal limits  CBC - Abnormal; Notable for the following components:   WBC 13.4 (*)    RBC 5.44 (*)    Hemoglobin 17.3 (*)    HCT 49.8 (*)    All other components within normal limits  RESP PANEL BY RT-PCR (FLU A&B, COVID) ARPGX2  ETHANOL  URINE DRUG SCREEN, QUALITATIVE ([facility] ONLY)  MAGNESIUM  TSH   ____________________________________________  EKG  ED ECG REPORT I, Arta Silence, the attending physician, personally viewed and interpreted this ECG.  Date: 12/16/2020 EKG Time: 1234 Rate: 70 Rhythm: normal sinus rhythm QRS Axis: normal Intervals: normal ST/T Wave abnormalities: normal Narrative Interpretation: no evidence of acute ischemia  ____________________________________________  RADIOLOGY    ____________________________________________   PROCEDURES  Procedure(s) performed: No  Procedures  Critical Care performed: No ____________________________________________   INITIAL IMPRESSION / ASSESSMENT AND PLAN / ED COURSE  Pertinent labs & imaging results that were available during my care of the patient were  reviewed by me and considered in my medical decision making (see chart for details).  64 year old female with PMH as noted above including history of bipolar disorder with psychotic features, depression, substance abuse, Parkinson's, migraines, and chronic pain presents with multiple complaints.  Specifically she is most concerned about a generalized burning sensation in her skin as well as poor p.o. intake and resulting perceived dehydration.  She reports suicidal ideation and  has attempted to harm her self by cutting her wrist.  I reviewed the past medical records in [place].  The patient came to the ED on 12/20 for the same complaints and had a reassuring lab work-up but left prior to being seen by a physician.  Previously she presented to the ED on 12/14 for similar complaints of burning and dehydration.  Medical work-up was negative.  She was thought to have pressured speech concerning for psychosis and was ultimately evaluated and cleared by psychiatry.  She was admitted to the inpatient psychiatry service in April after also presenting for somatic complaints of being unable to eat, drink, or sleep.  She was admitted to [facility] in May 2021 due to nausea and weight loss.  She had an unremarkable EGD and was thought to have idiopathic gastroparesis.  She ha had had multiple ED visits there earlier in the year for symptoms including dryness and itching to the skin and perceived dehydration.  On exam currently, the patient is anxious appearing but in no distress.  Her vital signs are normal.  The physical exam is unremarkable.  Mucous membranes are moist, there is no excessively dry or abnormal appearing skin or any lesions.  The abdomen is soft and nontender.  Given the numerous similar presentations and generally negative work-ups, the overall presentation is consistent with somatic symptoms as a result of the patient's underlying psychiatric issues.  Some of this could be related  to gastroparesis or even Parkinson's.  Initial lab work-up is unremarkable except for mild leukocytosis which appears to be chronic for this patient.  I have added on some additional studies.  I anticipate that the patient will be medically cleared.  Given the suicidal ideation and self-harm I have placed the patient under involuntary commitment and ordered psychiatry consultation.  If medically cleared, disposition will be based on psychiatry team recommendations.  ----------------------------------------- 3:27 PM on 12/16/2020 -----------------------------------------  Additional lab work-up is unremarkable.  The patient has been seen by Dr. Weber Cooks from psychiatry and disposition is pending.  I have signed the patient out to the oncoming physician Dr. Joni Fears.  ____________________________  The patient has been placed in psychiatric observation due to the need to provide a safe environment for the patient while obtaining psychiatric consultation and evaluation, as well as ongoing medical and medication management to treat the patient's condition.  The patient has been placed under full IVC at this time.  ____________________________________________   FINAL CLINICAL IMPRESSION(S) / ED DIAGNOSES  Final diagnoses:  Suicidal ideation      NEW MEDICATIONS STARTED DURING THIS VISIT:  New Prescriptions   No medications on file     Note:  This document was prepared using Dragon voice recognition software and may include unintentional dictation errors.    Arta Silence, MD 12/16/20 1527

## 2020-12-17 DIAGNOSIS — F315 Bipolar disorder, current episode depressed, severe, with psychotic features: Principal | ICD-10-CM

## 2020-12-17 LAB — LIPID PANEL
Cholesterol: 169 mg/dL (ref 0–200)
HDL: 33 mg/dL — ABNORMAL LOW (ref >40)
LDL Cholesterol: 106 mg/dL — ABNORMAL HIGH (ref 0–99)
Total CHOL/HDL Ratio: 5.1 RATIO
Triglycerides: 149 mg/dL (ref <150)
VLDL: 30 mg/dL (ref 0–40)

## 2020-12-17 MED ORDER — ADULT MULTIVITAMIN W/MINERALS CH
1.0000 | ORAL_TABLET | Freq: Every day | ORAL | Status: DC
  Administered 2020-12-18 – 2020-12-29 (×12): 1 via ORAL
  Filled 2020-12-17 (×12): qty 1

## 2020-12-17 MED ORDER — HYDROXYZINE HCL 50 MG PO TABS
50.0000 mg | ORAL_TABLET | Freq: Three times a day (TID) | ORAL | Status: DC | PRN
  Administered 2020-12-17 – 2020-12-28 (×5): 50 mg via ORAL
  Filled 2020-12-17 (×5): qty 1

## 2020-12-17 MED ORDER — DIPHENHYDRAMINE HCL 50 MG/ML IJ SOLN: 50.0000 mg | Freq: Four times a day (QID) | INTRAMUSCULAR | Status: DC | PRN

## 2020-12-17 MED ORDER — ENSURE ENLIVE PO LIQD
237.0000 mL | Freq: Two times a day (BID) | ORAL | Status: DC
  Administered 2020-12-17 – 2020-12-28 (×14): 237 mL via ORAL

## 2020-12-17 MED ORDER — DIPHENHYDRAMINE HCL 25 MG PO CAPS
50.0000 mg | ORAL_CAPSULE | Freq: Four times a day (QID) | ORAL | Status: DC | PRN
  Administered 2020-12-24: 02:00:00 50 mg via ORAL
  Filled 2020-12-17: qty 2

## 2020-12-17 MED ORDER — PREGABALIN 25 MG PO CAPS
25.0000 mg | ORAL_CAPSULE | Freq: Two times a day (BID) | ORAL | Status: DC
  Administered 2020-12-17 – 2020-12-18 (×2): 25 mg via ORAL
  Filled 2020-12-17 (×3): qty 1

## 2020-12-17 MED ORDER — POTASSIUM CHLORIDE CRYS ER 20 MEQ PO TBCR
40.0000 meq | EXTENDED_RELEASE_TABLET | Freq: Once | ORAL | Status: AC
  Administered 2020-12-17: 18:00:00 40 meq via ORAL
  Filled 2020-12-17: qty 2

## 2020-12-17 NOTE — H&P (Signed)
Psychiatric Admission Assessment Adult  Patient Identification: Shaleen Talamantez MRN:  [id number] Date of Evaluation:  12/17/2020 Chief Complaint:  Bipolar affective disorder, depressed, severe, with psychotic behavior ([place]) [F31.5] Principal Diagnosis: Bipolar affective disorder, depressed, severe, with psychotic behavior ([place]) Diagnosis:  Principal Problem:   Bipolar affective disorder, depressed, severe, with psychotic behavior ([place]) Active Problems:   Hypothyroidism   Gastroesophageal reflux disease with hiatal hernia   Cracking skin   Delusion ([place])  History of Present Illness: 64 year old woman with a history of bipolar disorder who comes to the hospital complaining of burning pain in her skin causing her to have suicidal ideations. She has scratched herself on the wrist due to feelings of wanting to die. She denies any homicidal ideations, auditory hallucinations, or visual hallucinations. She notes that due to pain she has not been sleeping or eating well. She notes that the ER doctor gave her Lyrica yesterday, and she is hopeful this will assist with pain. She states she has been on it before, and remembers tolerating it well. Gabapentin listed as allergy with potential throat swelling. Will continue Lyrica 25 mg BID and monitor for any allergic reaction. Continue to titrate if tolerated. Will continue Zyprexa 15 mg QHS.   Associated Signs/Symptoms: Depression Symptoms:  depressed mood, fatigue, hopelessness, recurrent thoughts of death, suicidal thoughts without plan, anxiety, disturbed sleep, weight loss, increased appetite, Duration of Depression Symptoms: Greater than two weeks  (Hypo) Manic Symptoms:  Delusions, Anxiety Symptoms:  Excessive Worry, Panic Symptoms, Psychotic Symptoms:  Delusions, Duration of Psychotic Symptoms: Less than six months  PTSD Symptoms: Negative Total Time spent with patient: 1 hour  Past Psychiatric History: Patient has a long  history of bipolar disorder with multiple hospitalizations.  Also chronic pain issues.  Is the patient at risk to self? Yes.    Has the patient been a risk to self in the past 6 months? Yes.    Has the patient been a risk to self within the distant past? No.  Is the patient a risk to others? No.  Has the patient been a risk to others in the past 6 months? No.  Has the patient been a risk to others within the distant past? No.   Prior Inpatient Therapy:   Prior Outpatient Therapy:    Alcohol Screening: 1. How often do you have a drink containing alcohol?: Never 2. How many drinks containing alcohol do you have on a typical day when you are drinking?: 1 or 2 3. How often do you have six or more drinks on one occasion?: Never AUDIT-C Score: 0 4. How often during the last year have you found that you were not able to stop drinking once you had started?: Never 5. How often during the last year have you failed to do what was normally expected from you because of drinking?: Never 6. How often during the last year have you needed a first drink in the morning to get yourself going after a heavy drinking session?: Never 7. How often during the last year have you had a feeling of guilt of remorse after drinking?: Never 8. How often during the last year have you been unable to remember what happened the night before because you had been drinking?: Never 9. Have you or someone else been injured as a result of your drinking?: No 10. Has a relative or friend or a doctor or another health worker been concerned about your drinking or suggested you cut down?: No Alcohol Use Disorder  Identification Test Final Score (AUDIT): 0 Alcohol Brief Interventions/Follow-up: AUDIT Score <7 follow-up not indicated Substance Abuse History in the last 12 months:  No. Consequences of Substance Abuse: Negative Previous Psychotropic Medications: Yes  Psychological Evaluations: Yes  Past Medical History:  Past Medical  History:  Diagnosis Date  . Back pain   . Cancer ([place])    skin  . Depression    BAD  . Gastroparesis   . GERD (gastroesophageal reflux disease)   . Hypothyroidism   . Insomnia   . Migraines   . Parkinson disease ([place])    per [facility], dx'd 2019  . Recurrent cold sores   . Shoulder bursitis   . Urge incontinence     Past Surgical History:  Procedure Laterality Date  . ABDOMINAL HYSTERECTOMY  1999   total  . APPENDECTOMY    . ESOPHAGOGASTRODUODENOSCOPY  01/2006   negative except small hiatal hernia  . ESOPHAGOGASTRODUODENOSCOPY  02/24/2015   See report  . LAPAROSCOPY     for endometriosis  . OVARIAN CYST REMOVAL  1990   unilateral  . TONSILLECTOMY AND ADENOIDECTOMY     Family History:  Family History  Problem Relation Age of Onset  . Hypertension Mother   . Arthritis Mother   . Diabetes Mother   . Stroke Mother   . Cancer Father        lung  . Colon cancer Neg Hx   . Breast cancer Neg Hx    Family Psychiatric  History: Denies Tobacco Screening: Have you used any form of tobacco in the last 30 days? (Cigarettes, Smokeless Tobacco, Cigars, and/or Pipes): Yes Tobacco use, Select all that apply: 5 or more cigarettes per day Are you interested in Tobacco Cessation Medications?: No, patient refused Counseled patient on smoking cessation including recognizing danger situations, developing coping skills and basic information about quitting provided: Refused/Declined practical counseling Social History:  Social History   Substance and Sexual Activity  Alcohol Use No  . Alcohol/week: 0.0 standard drinks     Social History   Substance and Sexual Activity  Drug Use No    Additional Social History:      History of alcohol / drug use?: No history of alcohol / drug abuse                    Allergies:   Allergies  Allergen Reactions  . Doxepin Other (See Comments)    Pt reports "that made me have seizures"  . Valproic Acid Shortness Of Breath  .  Aspirin     REACTION: UNSPECIFIED  . Atorvastatin Other (See Comments)    Tongue swelling, legs cramping  . Baclofen     REACTION: Throat swells up  . Codeine     REACTION: UNSPECIFIED  . Duloxetine Other (See Comments)    Vision loss, weight loss  . Erythromycin     REACTION: UNSPECIFIED  . Gabapentin     REACTION: throat swells up  . Metoclopramide Hcl     REACTION: states "messed me up"  . Nortriptyline     Other reaction(s): Other (See Comments) Vision issues  . Nsaids Other (See Comments)    Stomach problems  . Nucynta Er [Tapentadol Hcl Er]     Intolerant, see note from 07/24/12.    Gean [place] [Tapentadol]     AMS  . Prednisone     GI intolance  . Pregabalin   . Seroquel [Quetiapine Fumerate] Other (See Comments)    Loss control,  felt like on another planet  . Sulfa Antibiotics Nausea And Vomiting  . Topamax Other (See Comments)    Blisters in mouth and tongue  . Vicodin [Hydrocodone-Acetaminophen]     Nausea, thrush and constipation   Lab Results:  Results for orders placed or performed during the hospital encounter of 12/16/20 (from the past 48 hour(s))  Lipid panel     Status: Abnormal   Collection Time: 12/17/20  6:48 AM  Result Value Ref Range   Cholesterol 169 0 - 200 mg/dL   Triglycerides 149 <150 mg/dL   HDL 33 (L) >40 mg/dL   Total CHOL/HDL Ratio 5.1 RATIO   VLDL 30 0 - 40 mg/dL   LDL Cholesterol 106 (H) 0 - 99 mg/dL    Comment:        Total Cholesterol/HDL:CHD Risk Coronary Heart Disease Risk Table                     Men   Women  1/2 Average Risk   3.4   3.3  Average Risk       5.0   4.4  2 X Average Risk   9.6   7.1  3 X Average Risk  23.4   11.0        Use the calculated Patient Ratio above and the CHD Risk Table to determine the patient's CHD Risk.        ATP III CLASSIFICATION (LDL):  <100     mg/dL   Optimal  100-129  mg/dL   Near or Above                    Optimal  130-159  mg/dL   Borderline  160-189  mg/dL   High  >190      mg/dL   Very High Performed at [facility], [address] [place], [place] [address]     Blood Alcohol level:  Lab Results  Component Value Date   [facility] <10 12/16/2020   ETH <10 [id number]    Metabolic Disorder Labs:  Lab Results  Component Value Date   HGBA1C 5.4 10/13/2018   MPG 108.28 10/13/2018   MPG 111.15 08/29/2018   No results found for: PROLACTIN Lab Results  Component Value Date   CHOL 169 12/17/2020   TRIG 149 12/17/2020   HDL 33 (L) 12/17/2020   CHOLHDL 5.1 12/17/2020   VLDL 30 12/17/2020   LDLCALC 106 (H) 12/17/2020   LDLCALC 93 01/03/2020    Current Medications: Current Facility-Administered Medications  Medication Dose Route Frequency Provider Last Rate Last Admin  . acetaminophen (TYLENOL) tablet 650 mg  650 mg Oral Q6H PRN [name], [name], MD   650 mg at 12/17/20 0755  . albuterol (VENTOLIN HFA) 108 (90 Base) MCG/ACT inhaler 2 puff  2 puff Inhalation Q6H PRN [name], [name], MD      . alum & mag hydroxide-simeth (MAALOX/MYLANTA) 200-200-20 MG/5ML suspension 30 mL  30 mL Oral Q4H PRN [name], [name], MD      . diphenhydrAMINE (BENADRYL) capsule 50 mg  50 mg Oral Q6H PRN Salley Scarlet, MD       Or  . diphenhydrAMINE (BENADRYL) injection 50 mg  50 mg Intramuscular Q6H PRN Salley Scarlet, MD      . hydrOXYzine (ATARAX/VISTARIL) tablet 50 mg  50 mg Oral TID PRN Salley Scarlet, MD      . levothyroxine (SYNTHROID) tablet 88 mcg  88 mcg Oral  [id number] Gonzella Lex, MD   88 mcg at 12/17/20 0754  . magnesium hydroxide (MILK OF MAGNESIA) suspension 30 mL  30 mL Oral Daily PRN [name], [name], MD      . OLANZapine (ZYPREXA) tablet 15 mg  15 mg Oral QHS [name], [name], MD   15 mg at 12/16/20 2228  . pantoprazole (PROTONIX) EC tablet 40 mg  40 mg Oral Daily [name], [name], MD   40 mg at 12/17/20 0755  . pregabalin (LYRICA) capsule 25 mg  25 mg Oral BID Salley Scarlet, MD      . traZODone (DESYREL) tablet 100 mg  100 mg Oral QHS [name], [name], MD    100 mg at 12/16/20 2228   PTA Medications: Medications Prior to Admission  Medication Sig Dispense Refill Last Dose  . albuterol (VENTOLIN HFA) 108 (90 Base) MCG/ACT inhaler Inhale 2 puffs into the lungs every 6 (six) hours as needed for wheezing or shortness of breath. 18 g 11   . gabapentin (NEURONTIN) 300 MG capsule Take 1 capsule (300 mg total) by mouth 3 (three) times daily as needed (Skin pain). 90 capsule 0   . levothyroxine (SYNTHROID) 88 MCG tablet Take 1 tablet (88 mcg total) by mouth daily at 6 (six) AM. 90 tablet 1   . olanzapine zydis (ZYPREXA ZYDIS) 15 MG disintegrating tablet Take 1 tablet (15 mg total) by mouth at bedtime. 90 tablet 0   . omeprazole (PRILOSEC) 20 MG capsule Take 1 capsule (20 mg total) by mouth 2 (two) times daily before a meal. 180 capsule 1   . traZODone (DESYREL) 100 MG tablet Take 1 tablet (100 mg total) by mouth at bedtime. 90 tablet 0   . valACYclovir (VALTREX) 500 MG tablet TAKE 1 TABLET(500 MG) BY MOUTH TWICE DAILY IF NEEDED FOR FEVER BLISTER       Musculoskeletal: Strength & Muscle Tone: within normal limits Gait & Station: normal Patient leans: N/A  Psychiatric Specialty Exam: Physical Exam Vitals and nursing note reviewed.  Constitutional:      Appearance: Normal appearance.  HENT:     Head: Normocephalic and atraumatic.     Right Ear: External ear normal.     Left Ear: External ear normal.     Nose: Nose normal.     Mouth/Throat:     Mouth: Mucous membranes are moist.     Pharynx: Oropharynx is clear.  Eyes:     Extraocular Movements: Extraocular movements intact.     Conjunctiva/sclera: Conjunctivae normal.     Pupils: Pupils are equal, round, and reactive to light.  Cardiovascular:     Rate and Rhythm: Normal rate.     Pulses: Normal pulses.  Pulmonary:     Effort: Pulmonary effort is normal.     Breath sounds: Normal breath sounds.  Abdominal:     General: Abdomen is flat.     Palpations: Abdomen is soft.  Musculoskeletal:         General: No swelling. Normal range of motion.     Cervical back: Normal range of motion and neck supple.  Skin:    General: Skin is dry.     Findings: No rash.  Neurological:     General: No focal deficit present.     Mental Status: She is alert and oriented to person, place, and time.  Psychiatric:        Attention and Perception: Attention and perception normal.        Mood and  Affect: Mood is anxious. Affect is flat.        Speech: Speech normal.        Behavior: Behavior is withdrawn.        Thought Content: Thought content is paranoid and delusional. Thought content includes suicidal ideation. Thought content does not include suicidal plan.        Cognition and Memory: Cognition and memory normal.        Judgment: Judgment normal.     Review of Systems  Constitutional: Negative for fatigue and fever.  HENT: Negative for rhinorrhea and sore throat.   Eyes: Negative for photophobia and visual disturbance.  Respiratory: Negative for cough and shortness of breath.   Cardiovascular: Negative for chest pain and palpitations.  Gastrointestinal: Negative for constipation, diarrhea, nausea and vomiting.  Endocrine: Negative for cold intolerance and heat intolerance.  Genitourinary: Negative for difficulty urinating and dysuria.  Musculoskeletal: Positive for arthralgias and myalgias.  Skin: Negative for rash and wound.  Allergic/Immunologic: Positive for environmental allergies. Negative for food allergies.  Neurological: Negative for dizziness and weakness.  Hematological: Negative for adenopathy. Does not bruise/bleed easily.  Psychiatric/Behavioral: Positive for dysphoric mood and suicidal ideas. The patient is nervous/anxious.     Blood pressure 122/86, pulse 92, temperature 97.8 F (36.6 C), temperature source Oral, resp. rate 18, height 4\' 11"  (1.499 m), weight 50.8 kg, SpO2 97 %.Body mass index is 22.62 kg/m.  General Appearance: Casual  Eye Contact:  Good   Speech:  Clear and Coherent  Volume:  Normal  Mood:  Anxious, Depressed and Dysphoric  Affect:  Congruent  Thought Process:  Coherent  Orientation:  Full (Time, Place, and Person)  Thought Content:  Rumination  Suicidal Thoughts:  Yes.  without intent/plan  Homicidal Thoughts:  No  Memory:  Immediate;   Fair Recent;   Fair Remote;   Fair  Judgement:  Impaired  Insight:  Shallow  Psychomotor Activity:  Decreased  Concentration:  Concentration: Fair and Attention Span: Fair  Recall:  [employer] of Knowledge:  Fair  Language:  Fair  Akathisia:  No  Handed:  Right  AIMS (if indicated):     Assets:  Communication Skills Desire for Improvement Housing Resilience Social Support  ADL's:  Impaired  Cognition:  WNL  Sleep:  Number of Hours: 7.5         Treatment Plan Summary: Daily contact with patient to assess and evaluate symptoms and progress in treatment, Medication management and Plan89 year old woman with bipolar disorder presenting with somatic complaints of burning skin that has no medical etiology on workup. She continues to have passive suicidal ideation secondary to pain, and attempted to cut her wrist prior to admission. Patient reports previously tolerating Lyrica well, but is currently on allergy list with unknown reaction. Will continue Lyrica 25 mg BID, and titrate if tolerated.   Observation Level/Precautions:  15 minute checks  Laboratory:  Completed in ED  Psychotherapy:    Medications:    Consultations:    Discharge Concerns:    Estimated LOS:  Other:     Physician Treatment Plan for Primary Diagnosis: Bipolar affective disorder, depressed, severe, with psychotic behavior ([place]) Long Term Goal(s): Improvement in symptoms so as ready for discharge  Short Term Goals: Ability to identify changes in lifestyle to reduce recurrence of condition will improve, Ability to verbalize feelings will improve, Ability to disclose and discuss suicidal ideas, Ability to  demonstrate self-control will improve and Ability to identify and develop effective coping behaviors  will improve  Physician Treatment Plan for Secondary Diagnosis: Principal Problem:   Bipolar affective disorder, depressed, severe, with psychotic behavior ([place]) Active Problems:   Hypothyroidism   Gastroesophageal reflux disease with hiatal hernia   Cracking skin   Delusion ([place])  Long Term Goal(s): Improvement in symptoms so as ready for discharge  Short Term Goals: Ability to identify changes in lifestyle to reduce recurrence of condition will improve, Ability to verbalize feelings will improve, Ability to disclose and discuss suicidal ideas, Ability to demonstrate self-control will improve and Ability to identify and develop effective coping behaviors will improve  I certify that inpatient services furnished can reasonably be expected to improve the patient's condition.    Salley Scarlet, MD 12/23/202110:27 AM

## 2020-12-17 NOTE — Plan of Care (Signed)
Pt arrived on the unit at night. She has not had time to progress in these areas.  Problem: Education: Goal: Knowledge of [place] [facility] Education information/materials will improve Outcome: Not Progressing Goal: Emotional status will improve Outcome: Not Progressing Goal: Mental status will improve Outcome: Not Progressing Goal: Verbalization of understanding the information provided will improve Outcome: Not Progressing   Problem: Safety: Goal: Periods of time without injury will increase Outcome: Not Progressing   Problem: Education: Goal: Ability to state activities that reduce stress will improve Outcome: Not Progressing   Problem: Coping: Goal: Ability to identify and develop effective coping behavior will improve Outcome: Not Progressing   Problem: Self-Concept: Goal: Ability to identify factors that promote anxiety will improve Outcome: Not Progressing Goal: Level of anxiety will decrease Outcome: Not Progressing Goal: Ability to modify response to factors that promote anxiety will improve Outcome: Not Progressing   Problem: Education: Goal: Ability to incorporate positive changes in behavior to improve self-esteem will improve Outcome: Not Progressing

## 2020-12-17 NOTE — Plan of Care (Signed)
Pt rates depression 8/10 and hopelessness 3/10. Pt denies SI, HI and AVH. Pt was educated on care plan and verbalizes understanding. Collier Bullock RN Problem: Education: Goal: Knowledge of [place] [facility] Education information/materials will improve Outcome: Progressing Goal: Emotional status will improve Outcome: Not Progressing Goal: Mental status will improve Outcome: Not Progressing Goal: Verbalization of understanding the information provided will improve Outcome: Progressing   Problem: Safety: Goal: Periods of time without injury will increase Outcome: Progressing   Problem: Education: Goal: Ability to state activities that reduce stress will improve Outcome: Progressing   Problem: Coping: Goal: Ability to identify and develop effective coping behavior will improve Outcome: Progressing   Problem: Self-Concept: Goal: Ability to identify factors that promote anxiety will improve Outcome: Progressing Goal: Level of anxiety will decrease Outcome: Progressing Goal: Ability to modify response to factors that promote anxiety will improve Outcome: Progressing   Problem: Education: Goal: Ability to incorporate positive changes in behavior to improve self-esteem will improve Outcome: Progressing

## 2020-12-17 NOTE — [facility] Group Notes (Signed)
LCSW Group Therapy Note  12/17/2020 2:09 PM  Type of Therapy and Topic:  Group Therapy:  Feelings around Relapse and Recovery  Participation Level:  Did Not Attend   Description of Group:    Patients in this group will discuss emotions they experience before and after a relapse. They will process how experiencing these feelings, or avoidance of experiencing them, relates to having a relapse. Facilitator will guide patients to explore emotions they have related to recovery. Patients will be encouraged to process which emotions are more powerful. They will be guided to discuss the emotional reaction significant others in their lives may have to their relapse or recovery. Patients will be assisted in exploring ways to respond to the emotions of others without this contributing to a relapse.  Therapeutic Goals: 1. Patient will identify two or more emotions that lead to a relapse for them 2. Patient will identify two emotions that result when they relapse 3. Patient will identify two emotions related to recovery 4. Patient will demonstrate ability to communicate their needs through discussion and/or role plays   Summary of Patient Progress: Patient did not attend group despite encouraged participation.    Therapeutic Modalities:   Cognitive Behavioral Therapy Solution-Focused Therapy Assertiveness Training Relapse Prevention Therapy   Paulla Dolly, MSW, [place], [place] 12/17/2020 2:09 PM
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
## 2020-12-17 NOTE — Progress Notes (Signed)
Recreation Therapy Notes   Date: 12/17/2020  Time: 9:30 am   Location: Craft room     Behavioral response: N/A   Intervention Topic: Relaxation    Discussion/Intervention: Patient did not attend group.   Clinical Observations/Feedback:  Patient did not attend group.   Akhil Piscopo LRT/CTRS        Dechelle Attaway 12/17/2020 1:03 PM

## 2020-12-17 NOTE — Progress Notes (Signed)
Initial Nutrition Assessment  DOCUMENTATION CODES:   Not applicable  INTERVENTION:   Ensure Enlive po BID, each supplement provides 350 kcal and 20 grams of protein  MVI daily   Pt at high refeed risk; recommend monitor potassium, magnesium and phosphorus labs as oral intake improves  NUTRITION DIAGNOSIS:   Inadequate oral intake related to inability to eat as evidenced by per patient/family report.  GOAL:   Patient will meet greater than or equal to 90% of their needs  MONITOR:   PO intake,Supplement acceptance  REASON FOR ASSESSMENT:   Malnutrition Screening Tool    ASSESSMENT:   64 year old woman with a history of bipolar disorder, MDD, substance abuse, GERD, hiatal hernia, IBS, Parkinson's disease and gastroparesis who comes to the hospital complaining of burning pain in her skin causing her to have suicidal ideations.   Pt is known to this RD from previous admissions; the last one being in 02/2020. Pt with chronic poor oral intake, nausea and vomiting secondary to gastroparesis. Pt reports that she has not eaten for several days prior to admission; pt reports that she can only keep down some liquids. Pt enjoys Ensure and is usually compliant with this is hospital. RD will add supplements and MVI to help pt meet her estimated needs. Pt is at high refeed risk. Per chart, pt with numerous weight fluctuations but appears to be up in weight since this RD last saw patient in March. Pt is at high risk for malnutrition.   Medications reviewed and include: synthroid, protonix  Labs reviewed: K 3.4(L), P 2.0(L), Mg 2.2 wnl- 12/22 Wbc- 13.4(H)  Diet Order:   Diet Order            Diet regular Room service appropriate? Yes; Fluid consistency: Thin  Diet effective now                EDUCATION NEEDS:   No education needs have been identified at this time  Skin:  Skin Assessment: Reviewed RN Assessment  Last BM:  12/21  Height:   Ht Readings from Last 1 Encounters:   12/16/20 4\' 11"  (1.499 m)    Weight:   Wt Readings from Last 1 Encounters:  12/16/20 50.8 kg   BMI:  Body mass index is 22.62 kg/m.  Estimated Nutritional Needs:   Kcal:  1300-1500kcal/day  Protein:  65-75g/day  Fluid:  >1.3L/day  Koleen Distance MS, RD, LDN Please refer to [facility] for RD and/or RD on-call/weekend/after hours pager

## 2020-12-17 NOTE — BHH Suicide Risk Assessment (Signed)
[facility] Admission Suicide Risk Assessment   Nursing information obtained from:  Patient Demographic factors:  Divorced or widowed Current Mental Status:  Self-harm thoughts Loss Factors:  NA Historical Factors:  NA Risk Reduction Factors:  Positive social support  Total Time spent with patient: 1 hour Principal Problem: <principal problem not specified> Diagnosis:  Active Problems:   Bipolar affective disorder, depressed, severe, with psychotic behavior ([place])  Subjective Data: 64 year old woman with a history of bipolar disorder who comes to the hospital complaining of burning pain in her skin causing her to have suicidal ideations. She has scratched herself on the wrist due to feelings of wanting to die. She denies any homicidal ideations, auditory hallucinations, or visual hallucinations. She notes that due to pain she has not been sleeping or eating well. She notes that the ER doctor gave her Lyrica yesterday, and she is hopeful this will assist with pain. She states she has been on it before, and remembers tolerating it well. Gabapentin listed as allergy with potential throat swelling. Will continue Lyrica 25 mg BID and monitor for any allergic reaction. Continue to titrate if tolerated. Will continue Zyprexa 15 mg QHS.   Continued Clinical Symptoms:  Alcohol Use Disorder Identification Test Final Score (AUDIT): 0 The "Alcohol Use Disorders Identification Test", Guidelines for Use in Primary Care, Second Edition.  World Pharmacologist [facility]). Score between 0-7:  no or low risk or alcohol related problems. Score between 8-15:  moderate risk of alcohol related problems. Score between 16-19:  high risk of alcohol related problems. Score 20 or above:  warrants further diagnostic evaluation for alcohol dependence and treatment.   CLINICAL FACTORS:   Severe Anxiety and/or Agitation Bipolar Disorder:   Depressive phase Chronic Pain Previous Psychiatric Diagnoses and Treatments Medical  Diagnoses and Treatments/Surgeries   Musculoskeletal: Strength & Muscle Tone: within normal limits Gait & Station: normal Patient leans: N/A  Psychiatric Specialty Exam: Physical Exam Vitals and nursing note reviewed.  Constitutional:      Appearance: Normal appearance.  HENT:     Head: Normocephalic and atraumatic.     Right Ear: External ear normal.     Left Ear: External ear normal.     Nose: Nose normal.     Mouth/Throat:     Mouth: Mucous membranes are moist.     Pharynx: Oropharynx is clear.  Eyes:     Extraocular Movements: Extraocular movements intact.     Conjunctiva/sclera: Conjunctivae normal.     Pupils: Pupils are equal, round, and reactive to light.  Cardiovascular:     Rate and Rhythm: Normal rate.     Pulses: Normal pulses.  Pulmonary:     Effort: Pulmonary effort is normal.     Breath sounds: Normal breath sounds.  Abdominal:     General: Abdomen is flat.     Palpations: Abdomen is soft.  Musculoskeletal:        General: No swelling. Normal range of motion.     Cervical back: Normal range of motion and neck supple.  Skin:    General: Skin is dry.     Findings: No rash.  Neurological:     General: No focal deficit present.     Mental Status: She is alert and oriented to person, place, and time.  Psychiatric:        Attention and Perception: Attention and perception normal.        Mood and Affect: Mood is anxious. Affect is flat.  Speech: Speech normal.        Behavior: Behavior is withdrawn.        Thought Content: Thought content is paranoid and delusional. Thought content includes suicidal ideation. Thought content does not include suicidal plan.        Cognition and Memory: Cognition and memory normal.        Judgment: Judgment normal.     Review of Systems  Constitutional: Negative for fatigue and fever.  HENT: Negative for rhinorrhea and sore throat.   Eyes: Negative for photophobia and visual disturbance.  Respiratory: Negative for  cough and shortness of breath.   Cardiovascular: Negative for chest pain and palpitations.  Gastrointestinal: Negative for constipation, diarrhea, nausea and vomiting.  Endocrine: Negative for cold intolerance and heat intolerance.  Genitourinary: Negative for difficulty urinating and dysuria.  Musculoskeletal: Positive for arthralgias and myalgias.  Skin: Negative for rash and wound.  Allergic/Immunologic: Positive for environmental allergies. Negative for food allergies.  Neurological: Negative for dizziness and weakness.  Hematological: Negative for adenopathy. Does not bruise/bleed easily.  Psychiatric/Behavioral: Positive for dysphoric mood and suicidal ideas. The patient is nervous/anxious.     Blood pressure 122/86, pulse 92, temperature 97.8 F (36.6 C), temperature source Oral, resp. rate 18, height 4\' 11"  (1.499 m), weight 50.8 kg, SpO2 97 %.Body mass index is 22.62 kg/m.  General Appearance: Casual  Eye Contact:  Good  Speech:  Clear and Coherent  Volume:  Normal  Mood:  Anxious, Depressed and Dysphoric  Affect:  Congruent  Thought Process:  Coherent  Orientation:  Full (Time, Place, and Person)  Thought Content:  Rumination  Suicidal Thoughts:  Yes.  without intent/plan  Homicidal Thoughts:  No  Memory:  Immediate;   Fair Recent;   Fair Remote;   Fair  Judgement:  Impaired  Insight:  Shallow  Psychomotor Activity:  Decreased  Concentration:  Concentration: Fair and Attention Span: Fair  Recall:  [employer] of Knowledge:  Fair  Language:  Fair  Akathisia:  No  Handed:  Right  AIMS (if indicated):     Assets:  Communication Skills Desire for Improvement Housing Resilience Social Support  ADL's:  Impaired  Cognition:  WNL  Sleep:  Number of Hours: 7.5      COGNITIVE FEATURES THAT CONTRIBUTE TO RISK:  Loss of executive function    SUICIDE RISK:   Moderate:  Frequent suicidal ideation with limited intensity, and duration, some specificity in terms of  plans, no associated intent, good self-control, limited dysphoria/symptomatology, some risk factors present, and identifiable protective factors, including available and accessible social support.  PLAN OF CARE: Daily contact with patient to assess and evaluate symptoms and progress in treatment, Medication management and Plan 64 year old woman with bipolar disorder presenting with somatic complaints of burning skin that has no medical etiology on workup. She continues to have passive suicidal ideation secondary to pain, and attempted to cut her wrist prior to admission. Patient reports previously tolerating Lyrica well, but is currently on allergy list with unknown reaction. Will continue Lyrica 25 mg BID, and titrate if tolerated.   I certify that inpatient services furnished can reasonably be expected to improve the patient's condition.   Salley Scarlet, MD 12/17/2020, 10:18 AM

## 2020-12-17 NOTE — Progress Notes (Signed)
Pt is a 64 y.o. pt brought in through the ED complaining of feeling her skin is burning and dry. The feeling led to suicidal thoughts and scratching her wrists. She now has cuts on her left wrist that are scabbed over due to the scratching. Pt states she is depressed due to her pain, which is a "10+/10". She denies HI and AVH. She states she hasn't been able to sleep and has a poor appetite due to the burning sensation all over her body. She states she smokes 4-5 cigarettes/day and does not use any drugs or drink alcohol. She lives alone without any home care services and her main support system is her daughter, who also helps her get her medications. Pt appears anxious but is cooperative on assessment.

## 2020-12-17 NOTE — Progress Notes (Signed)
Pt was calm, cooperative and med compliant today. Pt mainly stayed isolated in her room except to come out for meals. Pt has a worried, flat affect. Pt did not attend groups. Pt had a PRN today for pain this am. Pt is safe.  Collier Bullock RN

## 2020-12-17 NOTE — Progress Notes (Signed)
Recreation Therapy Notes  INPATIENT RECREATION THERAPY ASSESSMENT  Patient Details Name: Leslie Wong MRN: [id number] DOB: 07-12-1956 Today's Date: 12/17/2020       Information Obtained From: Patient  Able to Participate in Assessment/Interview: Yes  Patient Presentation: Responsive  Reason for Admission (Per Patient): Active Symptoms  Patient Stressors:    Coping Skills:   Other (Comment) (None)  Leisure Interests (2+):  Music - Listen  Frequency of Recreation/Participation:    Awareness of Community Resources:     [employer]:     Current Use:    If no, Barriers?:    Expressed Interest in [employer] Information:    [place] of Residence:  Insurance underwriter  Patient Main Form of Transportation: Other (Comment) (Daughter)  Patient Strengths:  Would not hurt anyone.  Patient Identified Areas of Improvement:  Get better  Patient Goal for Hospitalization:  N/A  Current SI (including self-harm):  No  Current HI:  No  Current AVH: No  Staff Intervention Plan: Group Attendance,Collaborate with Interdisciplinary Treatment Team  Consent to Intern Participation: N/A  Leslie Wong 12/17/2020, 3:13 PM

## 2020-12-17 NOTE — BHH Suicide Risk Assessment (Signed)
[facility] INPATIENT:  Family/Significant Other Suicide Prevention Education  Suicide Prevention Education:  Patient Refusal for Family/Significant Other Suicide Prevention Education: The patient Leslie Wong has refused to provide written consent for family/significant other to be provided Family/Significant Other Suicide Prevention Education during admission and/or prior to discharge.  Physician notified.  SPE completed with pt, as pt refused to consent to family contact. SPI pamphlet provided to pt and pt was encouraged to share information with support network, ask questions, and talk about any concerns relating to SPE. Pt denies access to guns/firearms and verbalized understanding of information provided. Mobile Crisis information also provided to pt.  Shirl Harris 12/17/2020, 10:45 AM

## 2020-12-18 LAB — BASIC METABOLIC PANEL
Anion gap: 10 (ref 5–15)
BUN: 10 mg/dL (ref 8–23)
CO2: 25 mmol/L (ref 22–32)
Calcium: 10.1 mg/dL (ref 8.9–10.3)
Chloride: 108 mmol/L (ref 98–111)
Creatinine, Ser: 0.82 mg/dL (ref 0.44–1.00)
GFR, Estimated: >60 mL/min (ref >60)
Glucose, Bld: 141 mg/dL — ABNORMAL HIGH (ref 70–99)
Potassium: 3.9 mmol/L (ref 3.5–5.1)
Sodium: 143 mmol/L (ref 135–145)

## 2020-12-18 LAB — PHOSPHORUS: Phosphorus: 2.5 mg/dL (ref 2.5–4.6)

## 2020-12-18 LAB — MAGNESIUM: Magnesium: 2.1 mg/dL (ref 1.7–2.4)

## 2020-12-18 MED ORDER — PREGABALIN 25 MG PO CAPS
50.0000 mg | ORAL_CAPSULE | Freq: Two times a day (BID) | ORAL | Status: DC
  Administered 2020-12-18 – 2020-12-20 (×4): 50 mg via ORAL
  Filled 2020-12-18 (×4): qty 2

## 2020-12-18 NOTE — Tx Team (Addendum)
Interdisciplinary Treatment and Diagnostic Plan Update  12/18/2020 Time of Session: 9:00AM Leslie Wong MRN: [id number]  Principal Diagnosis: Bipolar affective disorder, depressed, severe, with psychotic behavior ([place])  Secondary Diagnoses: Principal Problem:   Bipolar affective disorder, depressed, severe, with psychotic behavior ([place]) Active Problems:   Hypothyroidism   Gastroesophageal reflux disease with hiatal hernia   Cracking skin   Delusion ([place])   Current Medications:  Current Facility-Administered Medications  Medication Dose Route Frequency Provider Last Rate Last Admin  . acetaminophen (TYLENOL) tablet 650 mg  650 mg Oral Q6H PRN [name], [name], MD   650 mg at 12/17/20 0755  . albuterol (VENTOLIN HFA) 108 (90 Base) MCG/ACT inhaler 2 puff  2 puff Inhalation Q6H PRN [name], [name], MD      . alum & mag hydroxide-simeth (MAALOX/MYLANTA) 200-200-20 MG/5ML suspension 30 mL  30 mL Oral Q4H PRN [name], [name], MD      . diphenhydrAMINE (BENADRYL) capsule 50 mg  50 mg Oral Q6H PRN Salley Scarlet, MD       Or  . diphenhydrAMINE (BENADRYL) injection 50 mg  50 mg Intramuscular Q6H PRN Salley Scarlet, MD      . feeding supplement (ENSURE ENLIVE / ENSURE PLUS) liquid 237 mL  237 mL Oral BID BM Salley Scarlet, MD   237 mL at 12/17/20 1844  . hydrOXYzine (ATARAX/VISTARIL) tablet 50 mg  50 mg Oral TID PRN Salley Scarlet, MD   50 mg at 12/17/20 2130  . levothyroxine (SYNTHROID) tablet 88 mcg  88 mcg Oral Q0600 [name], [name], MD   88 mcg at 12/18/20 [phone]  . magnesium hydroxide (MILK OF MAGNESIA) suspension 30 mL  30 mL Oral Daily PRN [name], [name], MD      . multivitamin with minerals tablet 1 tablet  1 tablet Oral Daily Salley Scarlet, MD   1 tablet at 12/18/20 [phone]  . OLANZapine (ZYPREXA) tablet 15 mg  15 mg Oral QHS [name], [name], MD   15 mg at 12/17/20 2130  . pantoprazole (PROTONIX) EC tablet 40 mg  40 mg Oral Daily [name], [name], MD   40 mg at 12/18/20  [id number]  . pregabalin (LYRICA) capsule 25 mg  25 mg Oral BID Salley Scarlet, MD   25 mg at 12/18/20 2800  . traZODone (DESYREL) tablet 100 mg  100 mg Oral QHS [name], [name], MD   100 mg at 12/17/20 2130   PTA Medications: Medications Prior to Admission  Medication Sig Dispense Refill Last Dose  . albuterol (VENTOLIN HFA) 108 (90 Base) MCG/ACT inhaler Inhale 2 puffs into the lungs every 6 (six) hours as needed for wheezing or shortness of breath. 18 g 11   . gabapentin (NEURONTIN) 300 MG capsule Take 1 capsule (300 mg total) by mouth 3 (three) times daily as needed (Skin pain). 90 capsule 0   . levothyroxine (SYNTHROID) 88 MCG tablet Take 1 tablet (88 mcg total) by mouth daily at 6 (six) AM. 90 tablet 1   . olanzapine zydis (ZYPREXA ZYDIS) 15 MG disintegrating tablet Take 1 tablet (15 mg total) by mouth at bedtime. 90 tablet 0   . omeprazole (PRILOSEC) 20 MG capsule Take 1 capsule (20 mg total) by mouth 2 (two) times daily before a meal. 180 capsule 1   . traZODone (DESYREL) 100 MG tablet Take 1 tablet (100 mg total) by mouth at bedtime. 90 tablet 0   . valACYclovir (VALTREX) 500 MG tablet TAKE  1 TABLET(500 MG) BY MOUTH TWICE DAILY IF NEEDED FOR FEVER BLISTER       Patient Stressors: Other: Pain, burning sensation over entire body  Patient Strengths: Capable of independent living General fund of knowledge Supportive family/friends  Treatment Modalities: Medication Management, Group therapy, Case management,  1 to 1 session with clinician, Psychoeducation, Recreational therapy.   Physician Treatment Plan for Primary Diagnosis: Bipolar affective disorder, depressed, severe, with psychotic behavior ([place]) Long Term Goal(s): Improvement in symptoms so as ready for discharge Improvement in symptoms so as ready for discharge   Short Term Goals: Ability to identify changes in lifestyle to reduce recurrence of condition will improve Ability to verbalize feelings will improve Ability to  disclose and discuss suicidal ideas Ability to demonstrate self-control will improve Ability to identify and develop effective coping behaviors will improve Ability to identify changes in lifestyle to reduce recurrence of condition will improve Ability to verbalize feelings will improve Ability to disclose and discuss suicidal ideas Ability to demonstrate self-control will improve Ability to identify and develop effective coping behaviors will improve  Medication Management: Evaluate patient's response, side effects, and tolerance of medication regimen.  Therapeutic Interventions: 1 to 1 sessions, Unit Group sessions and Medication administration.  Evaluation of Outcomes: Not Met  Physician Treatment Plan for Secondary Diagnosis: Principal Problem:   Bipolar affective disorder, depressed, severe, with psychotic behavior ([place]) Active Problems:   Hypothyroidism   Gastroesophageal reflux disease with hiatal hernia   Cracking skin   Delusion ([place])  Long Term Goal(s): Improvement in symptoms so as ready for discharge Improvement in symptoms so as ready for discharge   Short Term Goals: Ability to identify changes in lifestyle to reduce recurrence of condition will improve Ability to verbalize feelings will improve Ability to disclose and discuss suicidal ideas Ability to demonstrate self-control will improve Ability to identify and develop effective coping behaviors will improve Ability to identify changes in lifestyle to reduce recurrence of condition will improve Ability to verbalize feelings will improve Ability to disclose and discuss suicidal ideas Ability to demonstrate self-control will improve Ability to identify and develop effective coping behaviors will improve     Medication Management: Evaluate patient's response, side effects, and tolerance of medication regimen.  Therapeutic Interventions: 1 to 1 sessions, Unit Group sessions and Medication  administration.  Evaluation of Outcomes: Not Met   RN Treatment Plan for Primary Diagnosis: Bipolar affective disorder, depressed, severe, with psychotic behavior ([place]) Long Term Goal(s): Knowledge of disease and therapeutic regimen to maintain health will improve  Short Term Goals: Ability to remain free from injury will improve, Ability to verbalize frustration and anger appropriately will improve, Ability to demonstrate self-control, Ability to participate in decision making will improve, Ability to verbalize feelings will improve, Ability to disclose and discuss suicidal ideas, Ability to identify and develop effective coping behaviors will improve and Compliance with prescribed medications will improve  Medication Management: RN will administer medications as ordered by provider, will assess and evaluate patient's response and provide education to patient for prescribed medication. RN will report any adverse and/or side effects to prescribing provider.  Therapeutic Interventions: 1 on 1 counseling sessions, Psychoeducation, Medication administration, Evaluate responses to treatment, Monitor vital signs and CBGs as ordered, Perform/monitor CIWA, COWS, AIMS and Fall Risk screenings as ordered, Perform wound care treatments as ordered.  Evaluation of Outcomes: Not Met   LCSW Treatment Plan for Primary Diagnosis: Bipolar affective disorder, depressed, severe, with psychotic behavior ([place]) Long Term Goal(s): Safe transition to  appropriate next level of care at discharge, Engage patient in therapeutic group addressing interpersonal concerns.  Short Term Goals: Engage patient in aftercare planning with referrals and resources, Increase social support, Increase ability to appropriately verbalize feelings, Increase emotional regulation, Facilitate acceptance of mental health diagnosis and concerns, Identify triggers associated with mental health/substance abuse issues and Increase skills for wellness  and recovery  Therapeutic Interventions: Assess for all discharge needs, 1 to 1 time with Social worker, Explore available resources and support systems, Assess for adequacy in community support network, Educate family and significant other(s) on suicide prevention, Complete Psychosocial Assessment, Interpersonal group therapy.  Evaluation of Outcomes: Not Met   Progress in Treatment: Attending groups: No. Participating in groups: No. Taking medication as prescribed: Yes. Toleration medication: Yes. Family/Significant other contact made: No, will contact:  patient declined familial/collateral contact. Patient understands diagnosis: No. Discussing patient identified problems/goals with staff: Yes. Medical problems stabilized or resolved: Yes. Denies suicidal/homicidal ideation: Yes. Issues/concerns per patient self-inventory: No. Other: None.  New problem(s) identified: No, Describe:  none.  New Short Term/Long Term Goal(s): elimination of symptoms of psychosis, medication management for mood stabilization; elimination of SI thoughts; development of comprehensive mental wellness/sobriety plan.  Patient Goals: "I don't know right now." Patient expressed desire to not feel so sad and to work on some of the skin burning with the provider on the day prior.    Discharge Plan or Barriers: Patient plans to discharge home with continued outpatient treatment through [facility] in [place], [place].   Reason for Continuation of Hospitalization: Anxiety Delusions  Medication stabilization Suicidal ideation  Estimated Length of Stay: 1-7 days  Attendees: Patient: Petrea Fredenburg 12/18/2020 9:14 AM  Physician: Selina Cooley, MD 12/18/2020 9:14 AM  Nursing: Graceann Congress, RN 12/18/2020 9:14 AM  RN Care Manager: 12/18/2020 9:14 AM  Social Worker: Chalmers Guest. Guerry Bruin, MSW, [place], [place] 12/18/2020 9:14 AM  Recreational Therapist: Devin Going, LRT 12/18/2020 9:14 AM  Other: Paulla Dolly, MSW,  [place] 12/18/2020 9:14 AM  Other:  12/18/2020 9:14 AM  Other: 12/18/2020 9:14 AM    Scribe for Treatment Team: Shirl Harris, LCSW 12/18/2020 9:14 AM

## 2020-12-18 NOTE — Progress Notes (Signed)
   12/18/20 0200  Psych Admission Type (Psych Patients Only)  Admission Status Involuntary  Psychosocial Assessment  Patient Complaints Anxiety;Crying spells;Depression;Hopelessness  Eye Contact Fair  Facial Expression Anxious  Affect Anxious  Speech Logical/coherent  Interaction Attention-seeking  Motor Activity Slow  Appearance/Hygiene Disheveled  Behavior Characteristics Anxious  Mood Sad;Anxious  Aggressive Behavior  Effect No apparent injury  Thought Process  Coherency WDL  Content Preoccupation  Delusions None reported or observed  Perception WDL  Hallucination None reported or observed  Judgment Limited  Confusion WDL  Danger to Self  Current suicidal ideation? Denies  Self-Injurious Behavior No self-injurious ideation or behavior indicators observed or expressed   Agreement Not to Harm Self Yes  Description of Agreement VERBAL  Danger to Others  Danger to Others None reported or observed  Pt very anxious and teary at 2200 Prn vistaril PO given and effective. Pt in bed sleeping respirations noted otherwise in no s/s of distress. Will continue to monitor.

## 2020-12-18 NOTE — Plan of Care (Signed)
Pt remains anxious and preoccupied with skin issues. Calm reassurance provided.

## 2020-12-18 NOTE — Progress Notes (Signed)
Recreation Therapy Notes  Date: 12/18/2020  Time: 9:30 am   Location: Craft room     Behavioral response: N/A   Intervention Topic: Creative expressions   Discussion/Intervention: Patient did not attend group.   Clinical Observations/Feedback:  Patient did not attend group.   [name] LRT/CTRS        Leslie Wong 12/18/2020 11:29 AM

## 2020-12-18 NOTE — [facility] Group Notes (Signed)
LCSW Group Therapy Note  12/18/2020 1:22 PM  Type of Therapy and Topic:  Group Therapy:  Feelings around Relapse and Recovery  Participation Level:  Did Not Attend   Description of Group:    Patients in this group will discuss emotions they experience before and after a relapse. They will process how experiencing these feelings, or avoidance of experiencing them, relates to having a relapse. Facilitator will guide patients to explore emotions they have related to recovery. Patients will be encouraged to process which emotions are more powerful. They will be guided to discuss the emotional reaction significant others in their lives may have to their relapse or recovery. Patients will be assisted in exploring ways to respond to the emotions of others without this contributing to a relapse.  Therapeutic Goals: 1. Patient will identify two or more emotions that lead to a relapse for them 2. Patient will identify two emotions that result when they relapse 3. Patient will identify two emotions related to recovery 4. Patient will demonstrate ability to communicate their needs through discussion and/or role plays   Summary of Patient Progress: X  Therapeutic Modalities:   Cognitive Behavioral Therapy Solution-Focused Therapy Assertiveness Training Relapse Prevention Therapy   Hedy Camara R. Guerry Bruin, MSW, LCSW, [place] 12/18/2020 1:22 PM

## 2020-12-18 NOTE — Progress Notes (Signed)
[facility] MD Progress Note  12/18/2020 10:57 AM Leslie Wong  MRN:  [id number] Subjective:  64 year old woman with a history of bipolar disorder who comes to the hospital complaining of burning pain in her skincausing her to have suicidal ideations. She has scratched herself on the wrist due to feelings of wanting to die. Patient had no acute events overnight, and has been medication compliant.   Patient seen during treatment team and again one-on-one at bedside today. She states her goals are to work on her dry, burning skin. She notes that she feels hopeless and helpless about her situation, and feels she would rather die than endure this pain. She has not noticed anything that helps her pain, but notes if she showers too often this makes it worse. She notes that the pain has also been affecting her ability to eat. She has been drinking water and ensures. Will continue to monitor labs for refeeding syndrome per RD recommendations. Have also encouraged her to shower at least once weekly to maintain personal hygiene. She denies any auditory or visual hallucinations, and does not appear to be responding to internal stimuli. Will keep Zyprexa dose the same. She is tolerating Lyrica without side effects. Will increase to 50 mg BID today.   Principal Problem: Bipolar affective disorder, depressed, severe, with psychotic behavior ([place]) Diagnosis: Principal Problem:   Bipolar affective disorder, depressed, severe, with psychotic behavior ([place]) Active Problems:   Hypothyroidism   Gastroesophageal reflux disease with hiatal hernia   Cracking skin   Delusion ([place])  Total Time spent with patient: 45 minutes  Past Psychiatric History: Patient has a long history of bipolar disorder with multiple hospitalizations. Also chronic pain issues.  Past Medical History:  Past Medical History:  Diagnosis Date  . Back pain   . Cancer ([place])    skin  . Depression    BAD  . Gastroparesis   . GERD  (gastroesophageal reflux disease)   . Hypothyroidism   . Insomnia   . Migraines   . Parkinson disease ([place])    per [facility], dx'd 2019  . Recurrent cold sores   . Shoulder bursitis   . Urge incontinence     Past Surgical History:  Procedure Laterality Date  . ABDOMINAL HYSTERECTOMY  1999   total  . APPENDECTOMY    . ESOPHAGOGASTRODUODENOSCOPY  01/2006   negative except small hiatal hernia  . ESOPHAGOGASTRODUODENOSCOPY  02/24/2015   See report  . LAPAROSCOPY     for endometriosis  . OVARIAN CYST REMOVAL  1990   unilateral  . TONSILLECTOMY AND ADENOIDECTOMY     Family History:  Family History  Problem Relation Age of Onset  . Hypertension Mother   . Arthritis Mother   . Diabetes Mother   . Stroke Mother   . Cancer Father        lung  . Colon cancer Neg Hx   . Breast cancer Neg Hx    Family Psychiatric  History: Denies Social History:  Social History   Substance and Sexual Activity  Alcohol Use No  . Alcohol/week: 0.0 standard drinks     Social History   Substance and Sexual Activity  Drug Use No    Social History   Socioeconomic History  . Marital status: Divorced    Spouse name: Not on file  . Number of children: Not on file  . Years of education: Not on file  . Highest education level: Not on file  Occupational History  . Not  on file  Tobacco Use  . Smoking status: Current Some Day Smoker    Packs/day: 0.25    Years: 42.50    Pack years: 10.62    Types: Cigarettes  . Smokeless tobacco: Former Systems developer  . Tobacco comment: declined  Vaping Use  . Vaping Use: Never used  Substance and Sexual Activity  . Alcohol use: No    Alcohol/week: 0.0 standard drinks  . Drug use: No  . Sexual activity: Never  Other Topics Concern  . Not on file  Social History Narrative   Lives alone.     Has a dog names Dixie   Social Determinants of Health   Financial Resource Strain: Not on file  Food Insecurity: Not on file  Transportation Needs: Not on file   Physical Activity: Not on file  Stress: Not on file  Social Connections: Not on file   Additional Social History:    History of alcohol / drug use?: No history of alcohol / drug abuse                    Sleep: Fair  Appetite:  Poor  Current Medications: Current Facility-Administered Medications  Medication Dose Route Frequency Provider Last Rate Last Admin  . acetaminophen (TYLENOL) tablet 650 mg  650 mg Oral Q6H PRN [name], [name], MD   650 mg at 12/17/20 0755  . albuterol (VENTOLIN HFA) 108 (90 Base) MCG/ACT inhaler 2 puff  2 puff Inhalation Q6H PRN [name], [name], MD      . alum & mag hydroxide-simeth (MAALOX/MYLANTA) 200-200-20 MG/5ML suspension 30 mL  30 mL Oral Q4H PRN [name], [name], MD      . diphenhydrAMINE (BENADRYL) capsule 50 mg  50 mg Oral Q6H PRN Salley Scarlet, MD       Or  . diphenhydrAMINE (BENADRYL) injection 50 mg  50 mg Intramuscular Q6H PRN Salley Scarlet, MD      . feeding supplement (ENSURE ENLIVE / ENSURE PLUS) liquid 237 mL  237 mL Oral BID BM Salley Scarlet, MD   237 mL at 12/18/20 1055  . hydrOXYzine (ATARAX/VISTARIL) tablet 50 mg  50 mg Oral TID PRN Salley Scarlet, MD   50 mg at 12/17/20 2130  . levothyroxine (SYNTHROID) tablet 88 mcg  88 mcg Oral Q0600 [name], [name], MD   88 mcg at 12/18/20 [phone]  . magnesium hydroxide (MILK OF MAGNESIA) suspension 30 mL  30 mL Oral Daily PRN [name], [name], MD      . multivitamin with minerals tablet 1 tablet  1 tablet Oral Daily Salley Scarlet, MD   1 tablet at 12/18/20 [phone]  . OLANZapine (ZYPREXA) tablet 15 mg  15 mg Oral QHS [name], [name], MD   15 mg at 12/17/20 2130  . pantoprazole (PROTONIX) EC tablet 40 mg  40 mg Oral Daily [name], [name], MD   40 mg at 12/18/20 [id number]  . pregabalin (LYRICA) capsule 50 mg  50 mg Oral BID Salley Scarlet, MD      . traZODone (DESYREL) tablet 100 mg  100 mg Oral QHS [name], [name], MD   100 mg at 12/17/20 2130    Lab Results:  Results for orders placed or  performed during the hospital encounter of 12/16/20 (from the past 48 hour(s))  Lipid panel     Status: Abnormal   Collection Time: 12/17/20  6:48 AM  Result Value Ref Range   Cholesterol 169 0 -  200 mg/dL   Triglycerides 149 <150 mg/dL   HDL 33 (L) >40 mg/dL   Total CHOL/HDL Ratio 5.1 RATIO   VLDL 30 0 - 40 mg/dL   LDL Cholesterol 106 (H) 0 - 99 mg/dL    Comment:        Total Cholesterol/HDL:CHD Risk Coronary Heart Disease Risk Table                     Men   Women  1/2 Average Risk   3.4   3.3  Average Risk       5.0   4.4  2 X Average Risk   9.6   7.1  3 X Average Risk  23.4   11.0        Use the calculated Patient Ratio above and the CHD Risk Table to determine the patient's CHD Risk.        ATP III CLASSIFICATION (LDL):  <100     mg/dL   Optimal  100-129  mg/dL   Near or Above                    Optimal  130-159  mg/dL   Borderline  160-189  mg/dL   High  >190     mg/dL   Very High Performed at [facility], [address] [place],  [id number]   Basic metabolic panel     Status: Abnormal   Collection Time: 12/18/20  8:17 AM  Result Value Ref Range   Sodium 143 135 - 145 mmol/L   Potassium 3.9 3.5 - 5.1 mmol/L   Chloride 108 98 - 111 mmol/L   CO2 25 22 - 32 mmol/L   Glucose, Bld 141 (H) 70 - 99 mg/dL    Comment: Glucose reference range applies only to samples taken after fasting for at least 8 hours.   BUN 10 8 - 23 mg/dL   Creatinine, Ser 0.82 0.44 - 1.00 mg/dL   Calcium 10.1 8.9 - 10.3 mg/dL   GFR, Estimated >60 >60 mL/min    Comment: (NOTE) Calculated using the CKD-EPI Creatinine Equation (2021)    Anion gap 10 5 - 15    Comment: Performed at [facility], [address] [place],  [id number]    Blood Alcohol level:  Lab Results  Component Value Date   [facility] <10 12/16/2020   ETH <10 [id number]    Metabolic Disorder Labs: Lab Results  Component Value Date   HGBA1C 5.4 10/13/2018   MPG 108.28 10/13/2018   MPG 111.15  08/29/2018   No results found for: PROLACTIN Lab Results  Component Value Date   CHOL 169 12/17/2020   TRIG 149 12/17/2020   HDL 33 (L) 12/17/2020   CHOLHDL 5.1 12/17/2020   VLDL 30 12/17/2020   LDLCALC 106 (H) 12/17/2020   LDLCALC 93 01/03/2020    Physical Findings: AIMS:  , ,  ,  ,    CIWA:    COWS:     Musculoskeletal: Strength & Muscle Tone: within normal limits Gait & Station: normal Patient leans: N/A  Psychiatric Specialty Exam: Physical Exam Vitals and nursing note reviewed.  Constitutional:      Appearance: Normal appearance.  HENT:     Head: Normocephalic and atraumatic.     Right Ear: External ear normal.     Left Ear: External ear normal.     Nose: Nose normal.     Mouth/Throat:     Mouth: Mucous  membranes are moist.     Pharynx: Oropharynx is clear.  Eyes:     Extraocular Movements: Extraocular movements intact.     Conjunctiva/sclera: Conjunctivae normal.     Pupils: Pupils are equal, round, and reactive to light.  Cardiovascular:     Rate and Rhythm: Normal rate.     Pulses: Normal pulses.  Pulmonary:     Effort: Pulmonary effort is normal.     Breath sounds: Normal breath sounds.  Abdominal:     General: Abdomen is flat.     Palpations: Abdomen is soft.  Musculoskeletal:        General: No swelling. Normal range of motion.     Cervical back: Normal range of motion and neck supple.  Skin:    General: Skin is warm and dry.  Neurological:     General: No focal deficit present.     Mental Status: She is alert and oriented to person, place, and time.     Review of Systems  Constitutional: Negative for chills and fever.  HENT: Negative for rhinorrhea and sore throat.   Eyes: Negative for photophobia and visual disturbance.  Respiratory: Negative for cough and shortness of breath.   Cardiovascular: Negative for chest pain and palpitations.  Gastrointestinal: Negative for constipation, diarrhea, nausea and vomiting.  Endocrine: Negative for  cold intolerance and heat intolerance.  Genitourinary: Negative for difficulty urinating and dysuria.  Musculoskeletal: Positive for arthralgias and myalgias.  Skin: Negative for rash and wound.  Allergic/Immunologic: Negative for environmental allergies and food allergies.  Neurological: Negative for dizziness and headaches.  Hematological: Negative for adenopathy. Does not bruise/bleed easily.  Psychiatric/Behavioral: Positive for dysphoric mood, sleep disturbance and suicidal ideas. The patient is nervous/anxious.     Blood pressure (!) 136/97, pulse 92, temperature 98.5 F (36.9 C), temperature source Oral, resp. rate 17, height 4\' 11"  (1.499 m), weight 50.8 kg, SpO2 94 %.Body mass index is 22.62 kg/m.  General Appearance: Fairly Groomed  Eye Contact:  Good  Speech:  Normal Rate  Volume:  Decreased  Mood:  Anxious, Depressed and Dysphoric  Affect:  Congruent and Constricted  Thought Process:  Coherent  Orientation:  Full (Time, Place, and Person)  Thought Content:  Delusions and Rumination  Suicidal Thoughts:  Yes.  without intent/plan  Homicidal Thoughts:  No  Memory:  Immediate;   Fair  Judgement:  Fair  Insight:  Shallow  Psychomotor Activity:  Decreased  Concentration:  Concentration: Poor  Recall:  [employer] of Knowledge:  Fair  Language:  Fair  Akathisia:  Negative  Handed:  Right  AIMS (if indicated):     Assets:  Desire for Improvement Financial Resources/Insurance Housing Social Support  ADL's:  Impaired  Cognition:  Impaired,  Mild  Sleep:  Number of Hours: 8     Treatment Plan Summary: Daily contact with patient to assess and evaluate symptoms and progress in treatment and Medication management Plan34 year old woman with bipolar disorder presenting withsomatic complaints of burning skin that has no medical etiology on workup. She continues to have passive suicidal ideation secondary to pain, and attempted to cut her wrist prior to admission. Patient  reports previously tolerating Lyrica well, but is currently on allergy list with unknown reaction. Tolerating Lyrica 25 mg BID without side effects, will increase to 50 mg BID today.   Salley Scarlet, MD 12/18/2020, 10:57 AM

## 2020-12-18 NOTE — Progress Notes (Signed)
Nursing Note: 0700-1900  D:  Pt presents with anxious mood and congruent affect, pre-occupied with skin issues- "I took too many showers, that's why I think my skin is burning up."  Pt shares that she hurts all over and feels hopeless, tolerating Lyrica as prescribed. When asked about thoughts of self harm she reports: "I think I have harmed myself enough."  Pt reports blurred vision and poor appetite, Ensure given as ordered. Reports depression 9/10, anxiety 9/10 and rates hopelessness 10/10. Goal for today: "I don't know."  A:  Encouraged to verbalize needs and concerns, active listening and support provided.  Continued Q 15 minute safety checks. Pt given Hydroxyzine 50mg  PO for anxiety and barrier cream to hydrate skin.  R:  Pt. isolative and napping intermittently throughout shift. Denies A/V hallucinations and is able to verbally contract for safety.

## 2020-12-19 MED ORDER — AQUAPHOR EX OINT
TOPICAL_OINTMENT | Freq: Every day | CUTANEOUS | Status: DC | PRN
  Administered 2020-12-19: 1 via TOPICAL
  Filled 2020-12-19: qty 50

## 2020-12-19 NOTE — Progress Notes (Signed)
Patient has been isolative to room and to self. Denies SI, HI and AVH

## 2020-12-19 NOTE — Plan of Care (Signed)
  Problem: Education: Goal: Knowledge of [facility] Education information/materials will improve Outcome: Progressing Goal: Emotional status will improve Outcome: Progressing Goal: Mental status will improve Outcome: Progressing Goal: Verbalization of understanding the information provided will improve Outcome: Progressing   Problem: Safety: Goal: Periods of time without injury will increase Outcome: Progressing   Problem: Education: Goal: Ability to state activities that reduce stress will improve Outcome: Progressing   Problem: Coping: Goal: Ability to identify and develop effective coping behavior will improve Outcome: Progressing   Problem: Self-Concept: Goal: Ability to identify factors that promote anxiety will improve Outcome: Progressing Goal: Level of anxiety will decrease Outcome: Progressing Goal: Ability to modify response to factors that promote anxiety will improve Outcome: Progressing   Problem: Education: Goal: Ability to incorporate positive changes in behavior to improve self-esteem will improve Outcome: Progressing

## 2020-12-19 NOTE — Progress Notes (Signed)
[facility] MD Progress Note  12/19/2020 10:23 AM Almas Figley  MRN:  [id number]  Principal Problem: Bipolar affective disorder, depressed, severe, with psychotic behavior ([place]) Diagnosis: Principal Problem:   Bipolar affective disorder, depressed, severe, with psychotic behavior ([place]) Active Problems:   Hypothyroidism   Gastroesophageal reflux disease with hiatal hernia   Cracking skin   Delusion ([place])  Ms. Leslie Wong is a 64 y.o. female paqtient who presents to the [facility] unit for treatment of bipolar disorder exacerbation with suicidal thoghts in the context of somatic complaint (reported burning pain in her skin) that is likely delusional.  Interval History Patient was seen today for re-evaluation.  Nursing reports no events overnight. The patient avoids taking shower. Patient has been medication compliant.    Subjective:  On assessment patient reports "feeling terrible". Continues to report pain in her skin and mucousae - dry mouth, preventing her to eat and dry nose. She re[ports depressed mood and passive death wishes in settings of "unbearable pain". She reports she cannot sleep because of pain "I`m just lying with my eyes closed". She avoids showers and solid food as believes is makes the pain worse. She reports no symptoms improvement after the dose of Lyrica was increased yesterday, no side effects reported as well. She denies any hallucinations.  Labs: no new results for review. Last CMP was 12/24 - mostly wnl.   Total Time spent with patient: 20 minutes  Past Psychiatric History: see H&P   Past Medical History:  Past Medical History:  Diagnosis Date  . Back pain   . Cancer ([place])    skin  . Depression    BAD  . Gastroparesis   . GERD (gastroesophageal reflux disease)   . Hypothyroidism   . Insomnia   . Migraines   . Parkinson disease ([place])    per [facility], dx'd 2019  . Recurrent cold sores   . Shoulder bursitis   . Urge incontinence     Past Surgical  History:  Procedure Laterality Date  . ABDOMINAL HYSTERECTOMY  1999   total  . APPENDECTOMY    . ESOPHAGOGASTRODUODENOSCOPY  01/2006   negative except small hiatal hernia  . ESOPHAGOGASTRODUODENOSCOPY  02/24/2015   See report  . LAPAROSCOPY     for endometriosis  . OVARIAN CYST REMOVAL  1990   unilateral  . TONSILLECTOMY AND ADENOIDECTOMY     Family History:  Family History  Problem Relation Age of Onset  . Hypertension Mother   . Arthritis Mother   . Diabetes Mother   . Stroke Mother   . Cancer Father        lung  . Colon cancer Neg Hx   . Breast cancer Neg Hx    Family Psychiatric  History: see H&P  Social History:  Social History   Substance and Sexual Activity  Alcohol Use No  . Alcohol/week: 0.0 standard drinks     Social History   Substance and Sexual Activity  Drug Use No    Social History   Socioeconomic History  . Marital status: Divorced    Spouse name: Not on file  . Number of children: Not on file  . Years of education: Not on file  . Highest education level: Not on file  Occupational History  . Not on file  Tobacco Use  . Smoking status: Current Some Day Smoker    Packs/day: 0.25    Years: 42.50    Pack years: 10.62    Types: Cigarettes  . Smokeless  tobacco: Former Systems developer  . Tobacco comment: declined  Vaping Use  . Vaping Use: Never used  Substance and Sexual Activity  . Alcohol use: No    Alcohol/week: 0.0 standard drinks  . Drug use: No  . Sexual activity: Never  Other Topics Concern  . Not on file  Social History Narrative   Lives alone.     Has a dog names Dixie   Social Determinants of Health   Financial Resource Strain: Not on file  Food Insecurity: Not on file  Transportation Needs: Not on file  Physical Activity: Not on file  Stress: Not on file  Social Connections: Not on file   Additional Social History:    History of alcohol / drug use?: No history of alcohol / drug abuse                    Sleep:  Poor  Appetite:  Poor  Current Medications: Current Facility-Administered Medications  Medication Dose Route Frequency Provider Last Rate Last Admin  . acetaminophen (TYLENOL) tablet 650 mg  650 mg Oral Q6H PRN [name], [name], MD   650 mg at 12/17/20 0755  . albuterol (VENTOLIN HFA) 108 (90 Base) MCG/ACT inhaler 2 puff  2 puff Inhalation Q6H PRN [name], [name], MD      . alum & mag hydroxide-simeth (MAALOX/MYLANTA) 200-200-20 MG/5ML suspension 30 mL  30 mL Oral Q4H PRN [name], [name], MD      . diphenhydrAMINE (BENADRYL) capsule 50 mg  50 mg Oral Q6H PRN Salley Scarlet, MD       Or  . diphenhydrAMINE (BENADRYL) injection 50 mg  50 mg Intramuscular Q6H PRN Salley Scarlet, MD      . feeding supplement (ENSURE ENLIVE / ENSURE PLUS) liquid 237 mL  237 mL Oral BID BM Salley Scarlet, MD   237 mL at 12/18/20 1709  . hydrOXYzine (ATARAX/VISTARIL) tablet 50 mg  50 mg Oral TID PRN Salley Scarlet, MD   50 mg at 12/18/20 1513  . levothyroxine (SYNTHROID) tablet 88 mcg  88 mcg Oral Q0600 [name], [name], MD   88 mcg at 12/19/20 0617  . magnesium hydroxide (MILK OF MAGNESIA) suspension 30 mL  30 mL Oral Daily PRN [name], [name], MD      . multivitamin with minerals tablet 1 tablet  1 tablet Oral Daily Salley Scarlet, MD   1 tablet at 12/19/20 [phone]  . OLANZapine (ZYPREXA) tablet 15 mg  15 mg Oral QHS [name], [name], MD   15 mg at 12/18/20 2140  . pantoprazole (PROTONIX) EC tablet 40 mg  40 mg Oral Daily [name], [name], MD   40 mg at 12/19/20 0814  . pregabalin (LYRICA) capsule 50 mg  50 mg Oral BID Salley Scarlet, MD   50 mg at 12/19/20 9604  . traZODone (DESYREL) tablet 100 mg  100 mg Oral QHS [name], [name], MD   100 mg at 12/18/20 2140    Lab Results:  Results for orders placed or performed during the hospital encounter of 12/16/20 (from the past 48 hour(s))  Basic metabolic panel     Status: Abnormal   Collection Time: 12/18/20  8:17 AM  Result Value Ref Range   Sodium 143 135 -  145 mmol/L   Potassium 3.9 3.5 - 5.1 mmol/L   Chloride 108 98 - 111 mmol/L   CO2 25 22 - 32 mmol/L   Glucose, Bld 141 (H) 70 -  99 mg/dL    Comment: Glucose reference range applies only to samples taken after fasting for at least 8 hours.   BUN 10 8 - 23 mg/dL   Creatinine, Ser 0.82 0.44 - 1.00 mg/dL   Calcium 10.1 8.9 - 10.3 mg/dL   GFR, Estimated >60 >60 mL/min    Comment: (NOTE) Calculated using the CKD-EPI Creatinine Equation (2021)    Anion gap 10 5 - 15    Comment: Performed at [facility], [address] [place], [place] [address]  Magnesium     Status: None   Collection Time: 12/18/20  8:17 AM  Result Value Ref Range   Magnesium 2.1 1.7 - 2.4 mg/dL    Comment: Performed at [facility], [address] [place], [place] [address]  Phosphorus     Status: None   Collection Time: 12/18/20  8:17 AM  Result Value Ref Range   Phosphorus 2.5 2.5 - 4.6 mg/dL    Comment: Performed at [facility] [address] [place], [place] [address]    Blood Alcohol level:  Lab Results  Component Value Date   [facility] <10 12/16/2020   ETH <10 123456    Metabolic Disorder Labs: Lab Results  Component Value Date   HGBA1C 5.4 10/13/2018   MPG 108.28 10/13/2018   MPG 111.15 08/29/2018   No results found for: PROLACTIN Lab Results  Component Value Date   CHOL 169 12/17/2020   TRIG 149 12/17/2020   HDL 33 (L) 12/17/2020   CHOLHDL 5.1 12/17/2020   VLDL 30 12/17/2020   LDLCALC 106 (H) 12/17/2020   LDLCALC 93 01/03/2020    Physical Findings: AIMS:  , ,  ,  ,    CIWA:    COWS:     Musculoskeletal: Strength & Muscle Tone: within normal limits Gait & Station: normal Patient leans: N/A  Psychiatric Specialty Exam: Physical Exam  Review of Systems  Blood pressure (!) 147/87, pulse 77, temperature 98.1 F (36.7 C), temperature source Oral, resp. rate 18, height 4\' 11"  (1.499 m), weight 50.8 kg, SpO2 98 %.Body mass index is 22.62 kg/m.  General  Appearance: Casual  Eye Contact:  Good  Speech:  Normal Rate  Volume:  Decreased  Mood:  Anxious, Depressed, Dysphoric and Hopeless  Affect:  Congruent and Constricted  Thought Process:  Coherent  Orientation:  Full (Time, Place, and Person)  Thought Content:  Delusions and Rumination  Suicidal Thoughts:  no active suicidakl thoughts or plans; expresses passive death wishes  Homicidal Thoughts:  No  Memory:  Immediate;   Fair Recent;   Fair  Judgement:  Other:  limited  Insight:  Shallow  Psychomotor Activity:  Decreased  Concentration:  Concentration: Poor  Recall:  [employer] of Knowledge:  Fair  Language:  Fair  Akathisia:  No  Handed:  Right  AIMS (if indicated):     Assets:  Desire for Improvement Financial Resources/Insurance Housing Social Support  ADL's:  Impaired  Cognition:  Impaired,  Mild  Sleep:  Number of Hours: 8     Treatment Plan Summary: Daily contact with patient to assess and evaluate symptoms and progress in treatment and Medication management  Patient is a 64 year old female with the above-stated past psychiatric history who is seen in follow-up.  Chart reviewed. Patient discussed with nursing. Patient continues to express depressed mood and passive death wishes due to her somatic complaints. Tolerating current medications well, including newly-increased Lyrica. Will continue current medications without changes today.  Plan:  -continue inpatient psych admission; 15-minute checks; daily contact with patient to assess and evaluate symptoms and progress in treatment; psychoeducation.  -continue scheduled medications: . feeding supplement  237 mL Oral BID BM  . levothyroxine  88 mcg Oral Q0600  . multivitamin with minerals  1 tablet Oral Daily  . OLANZapine  15 mg Oral QHS  . pantoprazole  40 mg Oral Daily  . pregabalin  50 mg Oral BID  . traZODone  100 mg Oral QHS    -continue PRN medications. acetaminophen, albuterol, alum & mag  hydroxide-simeth, diphenhydrAMINE **OR** diphenhydrAMINE, hydrOXYzine, magnesium hydroxide, mineral oil-hydrophilic petrolatum  -Pertinent Labs: no new labs ordered today; last CMP from 12/24 - mostly wnl.    -Consults: No new consults placed since yesterday    -Disposition: Patient is not ready for discharge yet. All necessary aftercare will be arranged prior to discharge Likely d/c home with outpatient psych follow-up.  -  I certify that the patient does need, on a daily basis, active treatment furnished directly by or requiring the supervision of inpatient psychiatric facility personnel.   [name], MD 12/19/2020, 10:23 AM

## 2020-12-19 NOTE — Plan of Care (Signed)
Patient states " my whole body is burning.Sometimes the pain is unbearable that's why I came to the hospital." Patient denies any thoughts of hurting herself.Denies HI and AVH.Patient stated that Lyrica helps " little bit." Attended groups.Appetite and energy level good.Support and encouragement given.

## 2020-12-20 MED ORDER — PREGABALIN 25 MG PO CAPS
50.0000 mg | ORAL_CAPSULE | Freq: Three times a day (TID) | ORAL | Status: DC
  Administered 2020-12-20 – 2020-12-21 (×3): 50 mg via ORAL
  Filled 2020-12-20 (×3): qty 2

## 2020-12-20 NOTE — [facility] Group Notes (Signed)
[facility] Group Notes: (Clinical Social Work)   12/20/2020      Type of Therapy:  Group Therapy   Participation Level:  Did Not Attend - was invited individually by Nurse/MHT and chose not to attend.   Raina Mina, LCSWA 12/20/2020  1:37 PM

## 2020-12-20 NOTE — Plan of Care (Signed)
When asked for the suicidal thoughts patient states " I don't care if I die or live." Even after the pain medicine patient constantly states " I am in pain.My whole body is on fire." Patient stays in bed most of the day.Denies SI,HI and AVH at this time.Patient states that she hardly eats anything.Patient eats about 60% from her tray.Support and encouragement given.

## 2020-12-20 NOTE — Progress Notes (Signed)
[facility] MD Progress Note  12/20/2020 9:14 AM Leslie Wong  MRN:  [id number]  Principal Problem: Bipolar affective disorder, depressed, severe, with psychotic behavior ([place]) Diagnosis: Principal Problem:   Bipolar affective disorder, depressed, severe, with psychotic behavior ([place]) Active Problems:   Hypothyroidism   Gastroesophageal reflux disease with hiatal hernia   Cracking skin   Delusion ([place])  Leslie Wong is a 64 y.o. female patient who presents to the [facility] unit for treatment of bipolar disorder exacerbation with suicidal thoghts in the context of somatic complaint (reported burning pain in her skin) that is likely delusional.  Interval History Patient was seen today for re-evaluation.  Nursing reports no events overnight. Patient has been medication compliant.    Subjective: Patient reports she took a shower yesterday and it make her pain worse - "my all skin is burning. I feel terrible". She says "look at my hands, they all red" (objectively, hand skin color is normal). Continues to report pain in her skin and mucousae - dry mouth, preventing her to eat and dry nose. She says she thinks Lyrica helps "I dont now, maybe not at all, maybe a little", denies any allergic reactions to medication, agreed to dose increase. She continues to report depressed mood, insomnia and passive death wishes in settings of "unbearable pain". No side effects from current medications reported. She denies suicidal thoughts, homicidal thoughts, any hallucinations.  Labs: no new results for review. Last CMP was 12/24 - mostly wnl.   Total Time spent with patient: 20 minutes  Past Psychiatric History: see H&P   Past Medical History:  Past Medical History:  Diagnosis Date  . Back pain   . Cancer ([place])    skin  . Depression    BAD  . Gastroparesis   . GERD (gastroesophageal reflux disease)   . Hypothyroidism   . Insomnia   . Migraines   . Parkinson disease ([place])    per [facility], dx'd  2019  . Recurrent cold sores   . Shoulder bursitis   . Urge incontinence     Past Surgical History:  Procedure Laterality Date  . ABDOMINAL HYSTERECTOMY  1999   total  . APPENDECTOMY    . ESOPHAGOGASTRODUODENOSCOPY  01/2006   negative except small hiatal hernia  . ESOPHAGOGASTRODUODENOSCOPY  02/24/2015   See report  . LAPAROSCOPY     for endometriosis  . OVARIAN CYST REMOVAL  1990   unilateral  . TONSILLECTOMY AND ADENOIDECTOMY     Family History:  Family History  Problem Relation Age of Onset  . Hypertension Mother   . Arthritis Mother   . Diabetes Mother   . Stroke Mother   . Cancer Father        lung  . Colon cancer Neg Hx   . Breast cancer Neg Hx    Family Psychiatric  History: see H&P  Social History:  Social History   Substance and Sexual Activity  Alcohol Use No  . Alcohol/week: 0.0 standard drinks     Social History   Substance and Sexual Activity  Drug Use No    Social History   Socioeconomic History  . Marital status: Divorced    Spouse name: Not on file  . Number of children: Not on file  . Years of education: Not on file  . Highest education level: Not on file  Occupational History  . Not on file  Tobacco Use  . Smoking status: Current Some Day Smoker    Packs/day: 0.25  Years: 42.50    Pack years: 10.62    Types: Cigarettes  . Smokeless tobacco: Former Systems developer  . Tobacco comment: declined  Vaping Use  . Vaping Use: Never used  Substance and Sexual Activity  . Alcohol use: No    Alcohol/week: 0.0 standard drinks  . Drug use: No  . Sexual activity: Never  Other Topics Concern  . Not on file  Social History Narrative   Lives alone.     Has a dog names Dixie   Social Determinants of Health   Financial Resource Strain: Not on file  Food Insecurity: Not on file  Transportation Needs: Not on file  Physical Activity: Not on file  Stress: Not on file  Social Connections: Not on file   Additional Social History:    History of  alcohol / drug use?: No history of alcohol / drug abuse                    Sleep: Poor  Appetite:  Poor  Current Medications: Current Facility-Administered Medications  Medication Dose Route Frequency Provider Last Rate Last Admin  . acetaminophen (TYLENOL) tablet 650 mg  650 mg Oral Q6H PRN [name], [name], MD   650 mg at 12/17/20 0755  . albuterol (VENTOLIN HFA) 108 (90 Base) MCG/ACT inhaler 2 puff  2 puff Inhalation Q6H PRN [name], [name], MD      . alum & mag hydroxide-simeth (MAALOX/MYLANTA) 200-200-20 MG/5ML suspension 30 mL  30 mL Oral Q4H PRN [name], [name], MD      . diphenhydrAMINE (BENADRYL) capsule 50 mg  50 mg Oral Q6H PRN Salley Scarlet, MD       Or  . diphenhydrAMINE (BENADRYL) injection 50 mg  50 mg Intramuscular Q6H PRN Salley Scarlet, MD      . feeding supplement (ENSURE ENLIVE / ENSURE PLUS) liquid 237 mL  237 mL Oral BID BM Salley Scarlet, MD   237 mL at 12/19/20 1436  . hydrOXYzine (ATARAX/VISTARIL) tablet 50 mg  50 mg Oral TID PRN Salley Scarlet, MD   50 mg at 12/18/20 1513  . levothyroxine (SYNTHROID) tablet 88 mcg  88 mcg Oral Q0600 [name], [name], MD   88 mcg at 12/20/20 0718  . magnesium hydroxide (MILK OF MAGNESIA) suspension 30 mL  30 mL Oral Daily PRN [name], [name], MD      . mineral oil-hydrophilic petrolatum (AQUAPHOR) ointment   Topical Daily PRN [name], MD   1 application at [id number]  . multivitamin with minerals tablet 1 tablet  1 tablet Oral Daily Salley Scarlet, MD   1 tablet at 12/20/20 0809  . OLANZapine (ZYPREXA) tablet 15 mg  15 mg Oral QHS [name], [name], MD   15 mg at 12/19/20 2016  . pantoprazole (PROTONIX) EC tablet 40 mg  40 mg Oral Daily [name], [name], MD   40 mg at 12/20/20 [id number]  . pregabalin (LYRICA) capsule 50 mg  50 mg Oral BID Salley Scarlet, MD   50 mg at 12/20/20 0809  . traZODone (DESYREL) tablet 100 mg  100 mg Oral QHS [name], [name], MD   100 mg at 12/19/20 2016    Lab Results:  No results  found for this or any previous visit (from the past 48 hour(s)).  Blood Alcohol level:  Lab Results  Component Value Date   [facility] <10 12/16/2020   ETH <10 123456    Metabolic Disorder Labs:  Lab Results  Component Value Date   HGBA1C 5.4 10/13/2018   MPG 108.28 10/13/2018   MPG 111.15 08/29/2018   No results found for: PROLACTIN Lab Results  Component Value Date   CHOL 169 12/17/2020   TRIG 149 12/17/2020   HDL 33 (L) 12/17/2020   CHOLHDL 5.1 12/17/2020   VLDL 30 12/17/2020   LDLCALC 106 (H) 12/17/2020   LDLCALC 93 01/03/2020    Physical Findings: AIMS:  , ,  ,  ,    CIWA:    COWS:     Musculoskeletal: Strength & Muscle Tone: within normal limits Gait & Station: normal Patient leans: N/A  Psychiatric Specialty Exam: Physical Exam   Review of Systems   Blood pressure 137/79, pulse 70, temperature 98.6 F (37 C), temperature source Oral, resp. rate 18, height 4\' 11"  (1.499 m), weight 50.8 kg, SpO2 96 %.Body mass index is 22.62 kg/m.  General Appearance: Casual  Eye Contact:  Good  Speech:  Normal Rate  Volume:  Decreased  Mood:  Anxious, Depressed, Dysphoric and Hopeless  Affect:  Congruent and Constricted  Thought Process:  Coherent  Orientation:  Full (Time, Place, and Person)  Thought Content:  Delusions and Rumination  Suicidal Thoughts:  no active suicidakl thoughts or plans; expresses passive death wishes  Homicidal Thoughts:  No  Memory:  Immediate;   Fair Recent;   Fair  Judgement:  Other:  limited  Insight:  Shallow  Psychomotor Activity:  Decreased  Concentration:  Concentration: Poor  Recall:  [employer] of Knowledge:  Fair  Language:  Fair  Akathisia:  No  Handed:  Right  AIMS (if indicated):     Assets:  Desire for Improvement Financial Resources/Insurance Housing Social Support  ADL's:  Impaired  Cognition:  Impaired,  Mild  Sleep:  Number of Hours: 8.5     Treatment Plan Summary: Daily contact with patient to assess and  evaluate symptoms and progress in treatment and Medication management  Patient is a 64 year old female with the above-stated past psychiatric history who is seen in follow-up.  Chart reviewed. Patient discussed with nursing. Patient continues to express depressed mood and passive death wishes due to her somatic complaints. Tolerating current medications well, no allergic reaction to Lyrica observed - will increase the dose to 50mg  TID today.   Plan:  -continue inpatient psych admission; 15-minute checks; daily contact with patient to assess and evaluate symptoms and progress in treatment; psychoeducation.  -continue scheduled medications: . feeding supplement  237 mL Oral BID BM  . levothyroxine  88 mcg Oral Q0600  . multivitamin with minerals  1 tablet Oral Daily  . OLANZapine  15 mg Oral QHS  . pantoprazole  40 mg Oral Daily  . pregabalin  50 mg Oral TID  . traZODone  100 mg Oral QHS    -continue PRN medications. acetaminophen, albuterol, alum & mag hydroxide-simeth, diphenhydrAMINE **OR** diphenhydrAMINE, hydrOXYzine, magnesium hydroxide, mineral oil-hydrophilic petrolatum  -Pertinent Labs: no new labs ordered today; last CMP from 12/24 - mostly wnl.    -Consults: No new consults placed since yesterday    -Disposition: Patient is not ready for discharge yet. All necessary aftercare will be arranged prior to discharge Likely d/c home with outpatient psych follow-up.  -  I certify that the patient does need, on a daily basis, active treatment furnished directly by or requiring the supervision of inpatient psychiatric facility personnel.   [name], MD 12/20/2020, 9:14 AM

## 2020-12-21 LAB — COMPREHENSIVE METABOLIC PANEL
ALT: 19 U/L (ref 0–44)
AST: 26 U/L (ref 15–41)
Albumin: 4 g/dL (ref 3.5–5.0)
Alkaline Phosphatase: 115 U/L (ref 38–126)
Anion gap: 8 (ref 5–15)
BUN: 13 mg/dL (ref 8–23)
CO2: 28 mmol/L (ref 22–32)
Calcium: 10.7 mg/dL — ABNORMAL HIGH (ref 8.9–10.3)
Chloride: 107 mmol/L (ref 98–111)
Creatinine, Ser: 0.83 mg/dL (ref 0.44–1.00)
GFR, Estimated: >60 mL/min (ref >60)
Glucose, Bld: 133 mg/dL — ABNORMAL HIGH (ref 70–99)
Potassium: 4.2 mmol/L (ref 3.5–5.1)
Sodium: 143 mmol/L (ref 135–145)
Total Bilirubin: 0.3 mg/dL (ref 0.3–1.2)
Total Protein: 6.9 g/dL (ref 6.5–8.1)

## 2020-12-21 LAB — PHOSPHORUS: Phosphorus: 2.4 mg/dL — ABNORMAL LOW (ref 2.5–4.6)

## 2020-12-21 LAB — MAGNESIUM: Magnesium: 2.4 mg/dL (ref 1.7–2.4)

## 2020-12-21 MED ORDER — OLANZAPINE 10 MG PO TABS
20.0000 mg | ORAL_TABLET | Freq: Every day | ORAL | Status: DC
Start: 2020-12-21 — End: 2020-12-23
  Administered 2020-12-21 – 2020-12-22 (×2): 20 mg via ORAL
  Filled 2020-12-21 (×2): qty 2

## 2020-12-21 MED ORDER — OXYCODONE HCL 5 MG PO TABS: 10.0000 mg | ORAL_TABLET | Freq: Four times a day (QID) | ORAL | Status: DC | PRN

## 2020-12-21 MED ORDER — OXYCODONE HCL 5 MG PO TABS
10.0000 mg | ORAL_TABLET | Freq: Four times a day (QID) | ORAL | Status: DC | PRN
  Administered 2020-12-26 – 2020-12-29 (×4): 10 mg via ORAL
  Filled 2020-12-21 (×4): qty 2

## 2020-12-21 MED ORDER — PREGABALIN 25 MG PO CAPS: 100.0000 mg | ORAL_CAPSULE | Freq: Two times a day (BID) | ORAL | Status: DC

## 2020-12-21 MED ORDER — PREGABALIN 50 MG PO CAPS
150.0000 mg | ORAL_CAPSULE | Freq: Two times a day (BID) | ORAL | Status: DC
  Administered 2020-12-21 – 2020-12-22 (×2): 150 mg via ORAL
  Filled 2020-12-21: qty 3
  Filled 2020-12-21: qty 2
  Filled 2020-12-21: qty 6
  Filled 2020-12-21: qty 2
  Filled 2020-12-21: qty 6

## 2020-12-21 NOTE — Progress Notes (Signed)
Patient is pleasant and cooperative. She has been isolative to her room this evening but did come out for snacks and medication. She tolerated medications without incident. She denies HI AVH  She endorses anxiety and depression both she rates at 7/10 at this encounter.  She reports having passive SI but with no plan, explaining that she has random thoughts that come and go when she is laying there doing nothing. She has a Engineer, manufacturing for safety and is safe on the unit with 15 minute safety checks.  Patient was encouraged to come to staff with any concerns or if symptoms worsened.     Cleo Butler-Nicholson, LPN

## 2020-12-21 NOTE — Plan of Care (Signed)
Pt rates depression and anxiety both 8/10 and hopelessness 5/10. Pt denies AVH and HI. Pt has SI but contracts verbally for safety. Torrie Mayers RN Problem: Education: Goal: Knowledge of [facility] Education information/materials will improve 12/21/2020 [phone] by Chalmers Cater, RN Outcome: Progressing 12/21/2020 0950 by Chalmers Cater, RN Outcome: Progressing Goal: Emotional status will improve 12/21/2020 0953 by Chalmers Cater, RN Outcome: Not Progressing 12/21/2020 0950 by Chalmers Cater, RN Outcome: Progressing Goal: Mental status will improve 12/21/2020 0953 by Chalmers Cater, RN Outcome: Not Progressing 12/21/2020 0950 by Chalmers Cater, RN Outcome: Progressing Goal: Verbalization of understanding the information provided will improve 12/21/2020 0953 by Chalmers Cater, RN Outcome: Progressing 12/21/2020 0950 by Chalmers Cater, RN Outcome: Progressing   Problem: Safety: Goal: Periods of time without injury will increase 12/21/2020 0953 by Chalmers Cater, RN Outcome: Progressing 12/21/2020 0950 by Chalmers Cater, RN Outcome: Progressing   Problem: Education: Goal: Ability to state activities that reduce stress will improve 12/21/2020 0953 by Chalmers Cater, RN Outcome: Progressing 12/21/2020 0950 by Chalmers Cater, RN Outcome: Progressing   Problem: Coping: Goal: Ability to identify and develop effective coping behavior will improve 12/21/2020 0953 by Chalmers Cater, RN Outcome: Progressing 12/21/2020 0950 by Chalmers Cater, RN Outcome: Progressing   Problem: Self-Concept: Goal: Ability to identify factors that promote anxiety will improve 12/21/2020 0953 by Chalmers Cater, RN Outcome: Not Progressing 12/21/2020 0950 by Chalmers Cater, RN Outcome: Progressing Goal: Level of anxiety will decrease 12/21/2020 0953 by Chalmers Cater, RN Outcome: Not Progressing 12/21/2020 0950 by Chalmers Cater, RN Outcome: Progressing Goal: Ability to  modify response to factors that promote anxiety will improve 12/21/2020 0953 by Chalmers Cater, RN Outcome: Progressing 12/21/2020 0950 by Chalmers Cater, RN Outcome: Progressing   Problem: Education: Goal: Ability to incorporate positive changes in behavior to improve self-esteem will improve 12/21/2020 0953 by Chalmers Cater, RN Outcome: Progressing 12/21/2020 0950 by Chalmers Cater, RN Outcome: Progressing

## 2020-12-21 NOTE — Progress Notes (Signed)
[facility] MD Progress Note  12/21/2020 11:12 AM Leslie Wong  MRN:  [id number]  Principal Problem: Bipolar affective disorder, depressed, severe, with psychotic behavior (HCC) Diagnosis: Principal Problem:   Bipolar affective disorder, depressed, severe, with psychotic behavior (HCC) Active Problems:   Hypothyroidism   Gastroesophageal reflux disease with hiatal hernia   Cracking skin   Delusion (HCC)  Leslie Wong is a 64 y.o. female patient who presents to the [facility] unit for treatment of bipolar disorder exacerbation with suicidal thoghts in the context of somatic complaint (reported burning pain in her skin) that is likely delusional.  Interval History Patient was seen today for re-evaluation.  Nursing reports no events overnight. Patient has been medication compliant.    Subjective: Patient reports she took a shower yesterday and it make her pain worse - "my all skin is burning. I feel terrible". She says "look at my hands, they all red" (objectively, hand skin color is normal). Continues to report pain in her skin and mucousae - dry mouth, preventing her to eat and dry nose. She says she thinks Lyrica helps "I dont now, maybe not at all, maybe a little", denies any allergic reactions to medication, agreed to dose increase. She continues to report depressed mood, insomnia and passive death wishes in settings of "unbearable pain". No side effects from current medications reported. She denies suicidal thoughts, homicidal thoughts, any hallucinations.  Labs: no new results for review. Last CMP was 12/24 - mostly wnl.   Total Time spent with patient: 20 minutes  Past Psychiatric History: see H&P   Past Medical History:  Past Medical History:  Diagnosis Date  . Back pain   . Cancer (HCC)    skin  . Depression    BAD  . Gastroparesis   . GERD (gastroesophageal reflux disease)   . Hypothyroidism   . Insomnia   . Migraines   . Parkinson disease (HCC)    per [facility], dx'd  2019  . Recurrent cold sores   . Shoulder bursitis   . Urge incontinence     Past Surgical History:  Procedure Laterality Date  . ABDOMINAL HYSTERECTOMY  1999   total  . APPENDECTOMY    . ESOPHAGOGASTRODUODENOSCOPY  01/2006   negative except small hiatal hernia  . ESOPHAGOGASTRODUODENOSCOPY  02/24/2015   See report  . LAPAROSCOPY     for endometriosis  . OVARIAN CYST REMOVAL  1990   unilateral  . TONSILLECTOMY AND ADENOIDECTOMY     Family History:  Family History  Problem Relation Age of Onset  . Hypertension Mother   . Arthritis Mother   . Diabetes Mother   . Stroke Mother   . Cancer Father        lung  . Colon cancer Neg Hx   . Breast cancer Neg Hx    Family Psychiatric  History: see H&P  Social History:  Social History   Substance and Sexual Activity  Alcohol Use No  . Alcohol/week: 0.0 standard drinks     Social History   Substance and Sexual Activity  Drug Use No    Social History   Socioeconomic History  . Marital status: Divorced    Spouse name: Not on file  . Number of children: Not on file  . Years of education: Not on file  . Highest education level: Not on file  Occupational History  . Not on file  Tobacco Use  . Smoking status: Current Some Day Smoker    Packs/day: 0.25  Years: 42.50    Pack years: 10.62    Types: Cigarettes  . Smokeless tobacco: Former Neurosurgeon  . Tobacco comment: declined  Vaping Use  . Vaping Use: Never used  Substance and Sexual Activity  . Alcohol use: No    Alcohol/week: 0.0 standard drinks  . Drug use: No  . Sexual activity: Never  Other Topics Concern  . Not on file  Social History Narrative   Lives alone.     Has a dog names Dixie   Social Determinants of Health   Financial Resource Strain: Not on file  Food Insecurity: Not on file  Transportation Needs: Not on file  Physical Activity: Not on file  Stress: Not on file  Social Connections: Not on file   Additional Social History:    History of  alcohol / drug use?: No history of alcohol / drug abuse                    Sleep: Poor  Appetite:  Poor  Current Medications: Current Facility-Administered Medications  Medication Dose Route Frequency Provider Last Rate Last Admin  . acetaminophen (TYLENOL) tablet 650 mg  650 mg Oral Q6H PRN [name], Jackquline Denmark, MD   650 mg at 12/17/20 0755  . albuterol (VENTOLIN HFA) 108 (90 Base) MCG/ACT inhaler 2 puff  2 puff Inhalation Q6H PRN [name], [name], MD      . alum & mag hydroxide-simeth (MAALOX/MYLANTA) 200-200-20 MG/5ML suspension 30 mL  30 mL Oral Q4H PRN [name], [name], MD      . diphenhydrAMINE (BENADRYL) capsule 50 mg  50 mg Oral Q6H PRN Jesse Sans, MD       Or  . diphenhydrAMINE (BENADRYL) injection 50 mg  50 mg Intramuscular Q6H PRN Jesse Sans, MD      . feeding supplement (ENSURE ENLIVE / ENSURE PLUS) liquid 237 mL  237 mL Oral BID BM Jesse Sans, MD   237 mL at 12/20/20 1431  . hydrOXYzine (ATARAX/VISTARIL) tablet 50 mg  50 mg Oral TID PRN Jesse Sans, MD   50 mg at 12/18/20 1513  . levothyroxine (SYNTHROID) tablet 88 mcg  88 mcg Oral Q0600 [name], Jackquline Denmark, MD   88 mcg at 12/21/20 0612  . magnesium hydroxide (MILK OF MAGNESIA) suspension 30 mL  30 mL Oral Daily PRN [name], [name], MD      . mineral oil-hydrophilic petrolatum (AQUAPHOR) ointment   Topical Daily PRN Thalia Party, MD   1 application at 12/19/20 1635  . multivitamin with minerals tablet 1 tablet  1 tablet Oral Daily Jesse Sans, MD   1 tablet at 12/21/20 0758  . OLANZapine (ZYPREXA) tablet 20 mg  20 mg Oral QHS Jesse Sans, MD      . oxyCODONE (Oxy IR/ROXICODONE) immediate release tablet 10 mg  10 mg Oral QID PRN Jesse Sans, MD      . pantoprazole (PROTONIX) EC tablet 40 mg  40 mg Oral Daily [name], Jackquline Denmark, MD   40 mg at 12/21/20 0758  . pregabalin (LYRICA) capsule 100 mg  100 mg Oral BID Jesse Sans, MD      . traZODone (DESYREL) tablet 100 mg  100 mg Oral QHS [name],  Jackquline Denmark, MD   100 mg at 12/20/20 2100    Lab Results:  Results for orders placed or performed during the hospital encounter of 12/16/20 (from the past 48 hour(s))  Comprehensive metabolic panel  Status: Abnormal   Collection Time: 12/21/20 10:38 AM  Result Value Ref Range   Sodium 143 135 - 145 mmol/L   Potassium 4.2 3.5 - 5.1 mmol/L   Chloride 107 98 - 111 mmol/L   CO2 28 22 - 32 mmol/L   Glucose, Bld 133 (H) 70 - 99 mg/dL    Comment: Glucose reference range applies only to samples taken after fasting for at least 8 hours.   BUN 13 8 - 23 mg/dL   Creatinine, Ser 0.83 0.44 - 1.00 mg/dL   Calcium 10.7 (H) 8.9 - 10.3 mg/dL   Total Protein 6.9 6.5 - 8.1 g/dL   Albumin 4.0 3.5 - 5.0 g/dL   AST 26 15 - 41 U/L   ALT 19 0 - 44 U/L   Alkaline Phosphatase 115 38 - 126 U/L   Total Bilirubin 0.3 0.3 - 1.2 mg/dL   GFR, Estimated >60 >60 mL/min    Comment: (NOTE) Calculated using the CKD-EPI Creatinine Equation (2021)    Anion gap 8 5 - 15    Comment: Performed at [facility], [address] [place], [place] [address]    Blood Alcohol level:  Lab Results  Component Value Date   [facility] <10 12/16/2020   ETH <10 84/16/6063    Metabolic Disorder Labs: Lab Results  Component Value Date   HGBA1C 5.4 10/13/2018   MPG 108.28 10/13/2018   MPG 111.15 08/29/2018   No results found for: PROLACTIN Lab Results  Component Value Date   CHOL 169 12/17/2020   TRIG 149 12/17/2020   HDL 33 (L) 12/17/2020   CHOLHDL 5.1 12/17/2020   VLDL 30 12/17/2020   LDLCALC 106 (H) 12/17/2020   LDLCALC 93 01/03/2020    Physical Findings: AIMS:  , ,  ,  ,    CIWA:    COWS:     Musculoskeletal: Strength & Muscle Tone: within normal limits Gait & Station: normal Patient leans: N/A  Psychiatric Specialty Exam: Physical Exam Vitals and nursing note reviewed.  Constitutional:      Appearance: Normal appearance.  HENT:     Head: Normocephalic and atraumatic.     Right Ear: External ear  normal.     Left Ear: External ear normal.     Nose: Nose normal.     Mouth/Throat:     Mouth: Mucous membranes are moist.     Pharynx: Oropharynx is clear.  Eyes:     Extraocular Movements: Extraocular movements intact.     Conjunctiva/sclera: Conjunctivae normal.     Pupils: Pupils are equal, round, and reactive to light.  Cardiovascular:     Rate and Rhythm: Normal rate.     Pulses: Normal pulses.  Pulmonary:     Effort: Pulmonary effort is normal.     Breath sounds: Normal breath sounds.  Abdominal:     General: Abdomen is flat.     Palpations: Abdomen is soft.  Musculoskeletal:        General: No swelling. Normal range of motion.     Cervical back: Normal range of motion and neck supple.  Skin:    General: Skin is warm and dry.  Neurological:     General: No focal deficit present.     Mental Status: She is alert and oriented to person, place, and time.  Psychiatric:        Attention and Perception: Attention and perception normal.        Mood and Affect: Mood is depressed. Affect is  tearful.        Speech: Speech normal.        Behavior: Behavior is withdrawn.        Thought Content: Thought content is paranoid and delusional. Thought content includes suicidal ideation. Thought content does not include suicidal plan.        Cognition and Memory: Cognition and memory normal.        Judgment: Judgment normal.     Review of Systems  Constitutional: Negative for appetite change and fatigue.  HENT: Negative for rhinorrhea and sore throat.   Eyes: Negative for photophobia and visual disturbance.  Respiratory: Negative for cough and shortness of breath.   Cardiovascular: Negative for chest pain and palpitations.  Gastrointestinal: Negative for constipation, diarrhea, nausea and vomiting.  Endocrine: Negative for cold intolerance and heat intolerance.  Genitourinary: Negative for difficulty urinating and dysuria.  Musculoskeletal: Positive for arthralgias and myalgias.   Skin: Negative for color change and wound.  Allergic/Immunologic: Negative for environmental allergies.  Neurological: Negative for dizziness and headaches.  Hematological: Negative for adenopathy. Does not bruise/bleed easily.  Psychiatric/Behavioral: Positive for dysphoric mood, sleep disturbance and suicidal ideas. The patient is nervous/anxious.     Blood pressure (!) 139/92, pulse 92, temperature 98.4 F (36.9 C), temperature source Oral, resp. rate 18, height 4\' 11"  (1.499 m), weight 50.8 kg, SpO2 99 %.Body mass index is 22.62 kg/m.  General Appearance: Casual  Eye Contact:  Good  Speech:  Normal Rate  Volume:  Decreased  Mood:  Anxious, Depressed, Dysphoric and Hopeless  Affect:  Congruent and Constricted  Thought Process:  Coherent  Orientation:  Full (Time, Place, and Person)  Thought Content:  Delusions and Rumination  Suicidal Thoughts:  Yes.  without intent/plan  Homicidal Thoughts:  No  Memory:  Immediate;   Fair Recent;   Fair  Judgement:  Other:  limited  Insight:  Shallow  Psychomotor Activity:  Decreased  Concentration:  Concentration: Poor  Recall:  of Knowledge:  Fair  Language:  Fair  Akathisia:  No  Handed:  Right  AIMS (if indicated):     Assets:  Desire for Improvement Financial Resources/Insurance Housing Social Support  ADL's:  Impaired  Cognition:  Impaired,  Mild  Sleep:  Number of Hours: 8     Treatment Plan Summary: Daily contact with patient to assess and evaluate symptoms and progress in treatment and Medication management  Patient is a 64 year old female with the above-stated past psychiatric history who is seen in follow-up.  Chart reviewed. Patient discussed with nursing. Patient continues to express depressed mood and passive death wishes due to her somatic complaints. Tolerating current medications well, no allergic reaction to Lyrica observed - will increase the dose to 150mg  BID today. Patient also notes she was on  Oxycodone at home. PMP aware reviewed, confirmed home dose Oxycodone IR 10 mg four times daily as needed for pain.    Plan:  -continue inpatient psych admission; 15-minute checks; daily contact with patient to assess and evaluate symptoms and progress in treatment; psychoeducation.  -continue scheduled medications: . feeding supplement  237 mL Oral BID BM  . levothyroxine  88 mcg Oral Q0600  . multivitamin with minerals  1 tablet Oral Daily  . OLANZapine  20 mg Oral QHS  . pantoprazole  40 mg Oral Daily  . pregabalin  100 mg Oral BID  . traZODone  100 mg Oral QHS    -continue PRN medications. acetaminophen, albuterol, alum & mag hydroxide-simeth,  diphenhydrAMINE **OR** diphenhydrAMINE, hydrOXYzine, magnesium hydroxide, mineral oil-hydrophilic petrolatum, oxyCODONE  -Pertinent Labs: CMP today mostly WNL. Mg and Phos pending. Will continue to monitor for refeeding syndrome now that appetite has improved.     -Consults: No new consults placed since yesterday    -Disposition: Patient is not ready for discharge yet. All necessary aftercare will be arranged prior to discharge Likely d/c home with outpatient psych follow-up.  -  I certify that the patient does need, on a daily basis, active treatment furnished directly by or requiring the supervision of inpatient psychiatric facility personnel.   Salley Scarlet, MD 12/21/2020, 11:12 AM

## 2020-12-21 NOTE — [facility] Group Notes (Signed)
LCSW Group Therapy Note   12/21/2020 1:00 PM  Type of Therapy and Topic:  Group Therapy:  Overcoming Obstacles   Participation Level:  Did Not Attend   Description of Group:    In this group patients will be encouraged to explore what they see as obstacles to their own wellness and recovery. They will be guided to discuss their thoughts, feelings, and behaviors related to these obstacles. The group will process together ways to cope with barriers, with attention given to specific choices patients can make. Each patient will be challenged to identify changes they are motivated to make in order to overcome their obstacles. This group will be process-oriented, with patients participating in exploration of their own experiences as well as giving and receiving support and challenge from other group members.   Therapeutic Goals: 1. Patient will identify personal and current obstacles as they relate to admission. 2. Patient will identify barriers that currently interfere with their wellness or overcoming obstacles.  3. Patient will identify feelings, thought process and behaviors related to these barriers. 4. Patient will identify two changes they are willing to make to overcome these obstacles:      Summary of Patient Progress X   Therapeutic Modalities:   Cognitive Behavioral Therapy Solution Focused Therapy Motivational Interviewing Relapse Prevention Therapy  Penni Homans, MSW, LCSW 12/21/2020 11:30 AM

## 2020-12-21 NOTE — Progress Notes (Signed)
D: Patient displayed increased confusion throughout the night.  Early morning, patient thought the other patient's were in group and proceeded to look for them.  Staff redirected patient back to bed and informed her it was 3am.   Patient visible on the milieu. No distress noted. A: Support and encouragement offered. Scheduled medications given to pt. Q 15 min checks continued for patient safety. R: Patient receptive. Patient remains safe on the unit.

## 2020-12-21 NOTE — Progress Notes (Signed)
Recreation Therapy Notes  Date: 12/21/2020  Time: 9:30 am   Location: Craft room     Behavioral response: N/A   Intervention Topic: Goals   Discussion/Intervention: Patient did not attend group.   Clinical Observations/Feedback:  Patient did not attend group.   Ray Gervasi LRT/CTRS        Lorenna Lurry 12/21/2020 12:26 PM

## 2020-12-21 NOTE — Plan of Care (Signed)
  Problem: Education: Goal: Knowledge of [facility] Education information/materials will improve Outcome: Progressing Goal: Emotional status will improve Outcome: Progressing Goal: Mental status will improve Outcome: Progressing Goal: Verbalization of understanding the information provided will improve Outcome: Progressing   Problem: Safety: Goal: Periods of time without injury will increase Outcome: Progressing   Problem: Education: Goal: Ability to state activities that reduce stress will improve Outcome: Progressing   Problem: Coping: Goal: Ability to identify and develop effective coping behavior will improve Outcome: Progressing   Problem: Self-Concept: Goal: Ability to identify factors that promote anxiety will improve Outcome: Progressing Goal: Level of anxiety will decrease Outcome: Progressing Goal: Ability to modify response to factors that promote anxiety will improve Outcome: Progressing   Problem: Education: Goal: Ability to incorporate positive changes in behavior to improve self-esteem will improve Outcome: Progressing

## 2020-12-21 NOTE — Progress Notes (Signed)
Pt has been calm, cooperative and med compliant. Pt states that she feels no better today. She wants to take a PRN pain pill before bed time. Pt has a flat, anxious affect. Collier Bullock RN

## 2020-12-22 NOTE — Plan of Care (Signed)
  Problem: Education: Goal: Knowledge of [facility] Education information/materials will improve Outcome: Progressing Goal: Emotional status will improve Outcome: Progressing Goal: Mental status will improve Outcome: Progressing Goal: Verbalization of understanding the information provided will improve Outcome: Progressing   Problem: Safety: Goal: Periods of time without injury will increase Outcome: Progressing   Problem: Education: Goal: Ability to state activities that reduce stress will improve Outcome: Progressing   Problem: Coping: Goal: Ability to identify and develop effective coping behavior will improve Outcome: Progressing   Problem: Self-Concept: Goal: Ability to identify factors that promote anxiety will improve Outcome: Progressing Goal: Level of anxiety will decrease Outcome: Progressing Goal: Ability to modify response to factors that promote anxiety will improve Outcome: Progressing   Problem: Education: Goal: Ability to incorporate positive changes in behavior to improve self-esteem will improve Outcome: Progressing   

## 2020-12-22 NOTE — [facility] Group Notes (Signed)
LCSW Group Therapy Note  12/22/2020 1:31 PM  Type of Therapy/Topic:  Group Therapy:  Feelings about Diagnosis  Participation Level:  Did Not Attend   Description of Group:   This group will allow patients to explore their thoughts and feelings about diagnoses they have received. Patients will be guided to explore their level of understanding and acceptance of these diagnoses. Facilitator will encourage patients to process their thoughts and feelings about the reactions of others to their diagnosis and will guide patients in identifying ways to discuss their diagnosis with significant others in their lives. This group will be process-oriented, with patients participating in exploration of their own experiences, giving and receiving support, and processing challenge from other group members.   Therapeutic Goals: 1. Patient will demonstrate understanding of diagnosis as evidenced by identifying two or more symptoms of the disorder 2. Patient will be able to express two feelings regarding the diagnosis 3. Patient will demonstrate their ability to communicate their needs through discussion and/or role play  Summary of Patient Progress: Patient did not attend group despite encouraged participation.     Therapeutic Modalities:   Cognitive Behavioral Therapy Brief Therapy Feelings Identification   Gwenevere Ghazi, MSW, [place], Bridget Hartshorn 12/22/2020 1:31 PM

## 2020-12-22 NOTE — Progress Notes (Signed)
Recreation Therapy Notes   Date: 12/22/2020  Time: 9:30 am   Location: Craft room     Behavioral response: N/A   Intervention Topic: Time Management    Discussion/Intervention: Patient did not attend group.   Clinical Observations/Feedback:  Patient did not attend group.   Bryann Gentz LRT/CTRS        Abel Hageman 12/22/2020 11:48 AM

## 2020-12-22 NOTE — Progress Notes (Signed)
D: Pt alert and oriented. Pt rates depression 8/10, hopelessness 8/10, and anxiety 9/10. Pt reports sleep last night as being fair. Pt did receive medications for sleep and did not find them helpful. Pt reports experiencing 10/10 pain generalized pain, scheduled meds given. Pt denies experiencing any SI/HI, or AVH at this time.   Pt affect/mood appears as anxious/depressed. Pt only came out for breakfast and morning medications. Pt then went to bed and has stayed there sleeping all day. When encouraged to wake up and eat dinner and then come take medications pt had a slurred speech and stated she didn't think she could make it. This Clinical research associate took her vital signs, notified the charge nurse, gave the pt a Gatorade, notified the MD and per MD orders held evening medication (lyrica). Pt did not drink much of her Gatorade even with encouragement. Vital signs rechecked and were not much better. Pt also did not eat any of her dinner.   A: Scheduled medications administered to pt, per MD orders. Support and encouragement provided. Frequent verbal contact made. Routine safety checks conducted q15 minutes.   R: No adverse drug reactions noted. Pt verbally contracts for safety at this time. Pt complaint with medications. Pt interacts very minimally with others on the unit, mostly self isolating to room and sleeping. Pt remains safe at this time. Will continue to monitor.

## 2020-12-22 NOTE — Progress Notes (Signed)
[facility] MD Progress Note  12/22/2020 11:21 AM Leslie Wong  MRN:  [id number]  Principal Problem: Bipolar affective disorder, depressed, severe, with psychotic behavior ([place]) Diagnosis: Principal Problem:   Bipolar affective disorder, depressed, severe, with psychotic behavior ([place]) Active Problems:   Hypothyroidism   Gastroesophageal reflux disease with hiatal hernia   Cracking skin   Delusion ([place])  Leslie Wong is a 64 y.o. female patient who presents to the [facility] unit for treatment of bipolar disorder exacerbation with suicidal thoghts in the context of somatic complaint (reported burning pain in her skin) that is likely delusional.  Interval History Patient was seen today for re-evaluation.  Nursing reports no events overnight. Patient has been medication compliant.    Subjective: Patient seen one-on-one at bedside today. She reports that she feels "terrible" this morning, and continues to report pain over entire body. She denies any medication side effects, and reports she slept better overnight. She continues to have suicidal ideations without plan secondary to her pain.  She denies  homicidal thoughts, any hallucinations.  Labs: CMP mostly within normal limits. No signs of refeeding syndrome- K 4.2, Phos 2.4, Mg 2.4   Total Time spent with patient: 30 minutes  Past Psychiatric History: Patient has a long history of bipolar disorder with multiple hospitalizations. Also chronic pain issues.   Past Medical History:  Past Medical History:  Diagnosis Date  . Back pain   . Cancer ([place])    skin  . Depression    BAD  . Gastroparesis   . GERD (gastroesophageal reflux disease)   . Hypothyroidism   . Insomnia   . Migraines   . Parkinson disease ([place])    per [place] [facility], dx'd 2019  . Recurrent cold sores   . Shoulder bursitis   . Urge incontinence     Past Surgical History:  Procedure Laterality Date  . ABDOMINAL HYSTERECTOMY  1999   total  . APPENDECTOMY    .  ESOPHAGOGASTRODUODENOSCOPY  01/2006   negative except small hiatal hernia  . ESOPHAGOGASTRODUODENOSCOPY  02/24/2015   See report  . LAPAROSCOPY     for endometriosis  . OVARIAN CYST REMOVAL  1990   unilateral  . TONSILLECTOMY AND ADENOIDECTOMY     Family History:  Family History  Problem Relation Age of Onset  . Hypertension Mother   . Arthritis Mother   . Diabetes Mother   . Stroke Mother   . Cancer Father        lung  . Colon cancer Neg Hx   . Breast cancer Neg Hx    Family Psychiatric  History: Denies  Social History:  Social History   Substance and Sexual Activity  Alcohol Use No  . Alcohol/week: 0.0 standard drinks     Social History   Substance and Sexual Activity  Drug Use No    Social History   Socioeconomic History  . Marital status: Divorced    Spouse name: Not on file  . Number of children: Not on file  . Years of education: Not on file  . Highest education level: Not on file  Occupational History  . Not on file  Tobacco Use  . Smoking status: Current Some Day Smoker    Packs/day: 0.25    Years: 42.50    Pack years: 10.62    Types: Cigarettes  . Smokeless tobacco: Former Systems developer  . Tobacco comment: declined  Vaping Use  . Vaping Use: Never used  Substance and Sexual Activity  . Alcohol  use: No    Alcohol/week: 0.0 standard drinks  . Drug use: No  . Sexual activity: Never  Other Topics Concern  . Not on file  Social History Narrative   Lives alone.     Has a dog names Dixie   Social Determinants of Health   Financial Resource Strain: Not on file  Food Insecurity: Not on file  Transportation Needs: Not on file  Physical Activity: Not on file  Stress: Not on file  Social Connections: Not on file   Additional Social History:    History of alcohol / drug use?: No history of alcohol / drug abuse                    Sleep: Fair  Appetite:  Poor  Current Medications: Current Facility-Administered Medications  Medication  Dose Route Frequency Provider Last Rate Last Admin  . acetaminophen (TYLENOL) tablet 650 mg  650 mg Oral Q6H PRN [name], [name], MD   650 mg at 12/17/20 0755  . albuterol (VENTOLIN HFA) 108 (90 Base) MCG/ACT inhaler 2 puff  2 puff Inhalation Q6H PRN [name], [name], MD      . alum & mag hydroxide-simeth (MAALOX/MYLANTA) 200-200-20 MG/5ML suspension 30 mL  30 mL Oral Q4H PRN [name], [name], MD      . diphenhydrAMINE (BENADRYL) capsule 50 mg  50 mg Oral Q6H PRN Salley Scarlet, MD       Or  . diphenhydrAMINE (BENADRYL) injection 50 mg  50 mg Intramuscular Q6H PRN Salley Scarlet, MD      . feeding supplement (ENSURE ENLIVE / ENSURE PLUS) liquid 237 mL  237 mL Oral BID BM Salley Scarlet, MD   237 mL at 12/22/20 1014  . hydrOXYzine (ATARAX/VISTARIL) tablet 50 mg  50 mg Oral TID PRN Salley Scarlet, MD   50 mg at 12/22/20 0758  . levothyroxine (SYNTHROID) tablet 88 mcg  88 mcg Oral Q0600 [name], [name], MD   88 mcg at 12/22/20 0626  . magnesium hydroxide (MILK OF MAGNESIA) suspension 30 mL  30 mL Oral Daily PRN [name], [name], MD      . mineral oil-hydrophilic petrolatum (AQUAPHOR) ointment   Topical Daily PRN [name], MD   1 application at [id number]  . multivitamin with minerals tablet 1 tablet  1 tablet Oral Daily Salley Scarlet, MD   1 tablet at 12/22/20 0755  . OLANZapine (ZYPREXA) tablet 20 mg  20 mg Oral QHS Salley Scarlet, MD   20 mg at 12/21/20 2145  . oxyCODONE (Oxy IR/ROXICODONE) immediate release tablet 10 mg  10 mg Oral QID PRN Salley Scarlet, MD      . pantoprazole (PROTONIX) EC tablet 40 mg  40 mg Oral Daily [name], [name], MD   40 mg at 12/22/20 0755  . pregabalin (LYRICA) capsule 150 mg  150 mg Oral BID Salley Scarlet, MD   150 mg at 12/22/20 0759  . traZODone (DESYREL) tablet 100 mg  100 mg Oral QHS [name], [name], MD   100 mg at 12/21/20 2145    Lab Results:  Results for orders placed or performed during the hospital encounter of 12/16/20 (from the past  48 hour(s))  Comprehensive metabolic panel     Status: Abnormal   Collection Time: 12/21/20 10:38 AM  Result Value Ref Range   Sodium 143 135 - 145 mmol/L   Potassium 4.2 3.5 - 5.1 mmol/L   Chloride  107 98 - 111 mmol/L   CO2 28 22 - 32 mmol/L   Glucose, Bld 133 (H) 70 - 99 mg/dL    Comment: Glucose reference range applies only to samples taken after fasting for at least 8 hours.   BUN 13 8 - 23 mg/dL   Creatinine, Ser 9.21 0.44 - 1.00 mg/dL   Calcium 19.4 (H) 8.9 - 10.3 mg/dL   Total Protein 6.9 6.5 - 8.1 g/dL   Albumin 4.0 3.5 - 5.0 g/dL   AST 26 15 - 41 U/L   ALT 19 0 - 44 U/L   Alkaline Phosphatase 115 38 - 126 U/L   Total Bilirubin 0.3 0.3 - 1.2 mg/dL   GFR, Estimated >17 >40 mL/min    Comment: (NOTE) Calculated using the CKD-EPI Creatinine Equation (2021)    Anion gap 8 5 - 15    Comment: Performed at [facility], [address], [place], [place] [address]  Magnesium     Status: None   Collection Time: 12/21/20 10:38 AM  Result Value Ref Range   Magnesium 2.4 1.7 - 2.4 mg/dL    Comment: Performed at [facility], [address], [place], [place] [address]  Phosphorus     Status: Abnormal   Collection Time: 12/21/20 10:38 AM  Result Value Ref Range   Phosphorus 2.4 (L) 2.5 - 4.6 mg/dL    Comment: Performed at [facility], [address] [place], [place] [address]    Blood Alcohol level:  Lab Results  Component Value Date   [facility] <10 12/16/2020   ETH <10 12/08/2020    Metabolic Disorder Labs: Lab Results  Component Value Date   HGBA1C 5.4 10/13/2018   MPG 108.28 10/13/2018   MPG 111.15 08/29/2018   No results found for: PROLACTIN Lab Results  Component Value Date   CHOL 169 12/17/2020   TRIG 149 12/17/2020   HDL 33 (L) 12/17/2020   CHOLHDL 5.1 12/17/2020   VLDL 30 12/17/2020   LDLCALC 106 (H) 12/17/2020   LDLCALC 93 01/03/2020    Physical Findings: AIMS:  , ,  ,  ,    CIWA:    COWS:     Musculoskeletal: Strength &  Muscle Tone: within normal limits Gait & Station: normal Patient leans: N/A  Psychiatric Specialty Exam: Physical Exam Vitals and nursing note reviewed.  Constitutional:      Appearance: Normal appearance.  HENT:     Head: Normocephalic and atraumatic.     Right Ear: External ear normal.     Left Ear: External ear normal.     Nose: Nose normal.     Mouth/Throat:     Mouth: Mucous membranes are moist.     Pharynx: Oropharynx is clear.  Eyes:     Extraocular Movements: Extraocular movements intact.     Conjunctiva/sclera: Conjunctivae normal.     Pupils: Pupils are equal, round, and reactive to light.  Cardiovascular:     Rate and Rhythm: Normal rate.     Pulses: Normal pulses.  Pulmonary:     Effort: Pulmonary effort is normal.     Breath sounds: Normal breath sounds.  Abdominal:     General: Abdomen is flat.     Palpations: Abdomen is soft.  Musculoskeletal:        General: No swelling. Normal range of motion.     Cervical back: Normal range of motion and neck supple.  Skin:    General: Skin is warm and dry.  Neurological:  General: No focal deficit present.     Mental Status: She is alert and oriented to person, place, and time.  Psychiatric:        Attention and Perception: Attention and perception normal.        Mood and Affect: Mood is depressed. Affect is tearful.        Speech: Speech normal.        Behavior: Behavior is withdrawn.        Thought Content: Thought content is paranoid and delusional. Thought content includes suicidal ideation. Thought content does not include suicidal plan.        Cognition and Memory: Cognition and memory normal.        Judgment: Judgment normal.     Review of Systems  Constitutional: Negative for appetite change and fatigue.  HENT: Negative for rhinorrhea and sore throat.   Eyes: Negative for photophobia and visual disturbance.  Respiratory: Negative for cough and shortness of breath.   Cardiovascular: Negative for chest  pain and palpitations.  Gastrointestinal: Negative for constipation, diarrhea, nausea and vomiting.  Endocrine: Negative for cold intolerance and heat intolerance.  Genitourinary: Negative for difficulty urinating and dysuria.  Musculoskeletal: Positive for arthralgias and myalgias.  Skin: Negative for color change and wound.  Allergic/Immunologic: Negative for environmental allergies.  Neurological: Negative for dizziness and headaches.  Hematological: Negative for adenopathy. Does not bruise/bleed easily.  Psychiatric/Behavioral: Positive for dysphoric mood, sleep disturbance and suicidal ideas. The patient is nervous/anxious.     Blood pressure 121/89, pulse 95, temperature 98.1 F (36.7 C), temperature source Oral, resp. rate 18, height 4\' 11"  (1.499 m), weight 50.8 kg, SpO2 96 %.Body mass index is 22.62 kg/m.  General Appearance: Casual  Eye Contact:  Good  Speech:  Normal Rate  Volume:  Decreased  Mood:  Anxious, Depressed, Dysphoric and Hopeless  Affect:  Congruent and Constricted  Thought Process:  Coherent  Orientation:  Full (Time, Place, and Person)  Thought Content:  Delusions and Rumination  Suicidal Thoughts:  Yes.  without intent/plan  Homicidal Thoughts:  No  Memory:  Immediate;   Fair Recent;   Fair  Judgement:  Other:  limited  Insight:  Shallow  Psychomotor Activity:  Decreased  Concentration:  Concentration: Poor  Recall:  [employer] of Knowledge:  Fair  Language:  Fair  Akathisia:  No  Handed:  Right  AIMS (if indicated):     Assets:  Desire for Improvement Financial Resources/Insurance Housing Social Support  ADL's:  Impaired  Cognition:  Impaired,  Mild  Sleep:  Number of Hours: 8     Treatment Plan Summary: Daily contact with patient to assess and evaluate symptoms and progress in treatment and Medication management  Patient is a 64 year old female with the above-stated past psychiatric history who is seen in follow-up.  Chart reviewed.  Patient discussed with nursing. Patient continues to express depressed mood and passive death wishes due to her somatic complaints. Tolerating current medications well, no allergic reaction to Lyrica observed -continue the dose to 150mg  BID today. Patient also notes she was on Oxycodone at home. PMP aware reviewed, confirmed home dose Oxycodone IR 10 mg four times daily as needed for pain.    Plan:  -continue inpatient psych admission; 15-minute checks; daily contact with patient to assess and evaluate symptoms and progress in treatment; psychoeducation.  -continue scheduled medications: . feeding supplement  237 mL Oral BID BM  . levothyroxine  88 mcg Oral Q0600  . multivitamin with minerals  1 tablet Oral Daily  . OLANZapine  20 mg Oral QHS  . pantoprazole  40 mg Oral Daily  . pregabalin  150 mg Oral BID  . traZODone  100 mg Oral QHS    -continue PRN medications. acetaminophen, albuterol, alum & mag hydroxide-simeth, diphenhydrAMINE **OR** diphenhydrAMINE, hydrOXYzine, magnesium hydroxide, mineral oil-hydrophilic petrolatum, oxyCODONE  -Pertinent Labs: CMP today mostly WNL. Mg and Phos pending. Will continue to monitor for refeeding syndrome now that appetite has improved.     -Consults: No new consults placed since yesterday    -Disposition: Patient is not ready for discharge yet. All necessary aftercare will be arranged prior to discharge Likely d/c home with outpatient psych follow-up.  -  I certify that the patient does need, on a daily basis, active treatment furnished directly by or requiring the supervision of inpatient psychiatric facility personnel.   Salley Scarlet, MD 12/22/2020, 11:21 AM

## 2020-12-23 MED ORDER — MIRTAZAPINE 15 MG PO TABS
15.0000 mg | ORAL_TABLET | Freq: Every day | ORAL | Status: DC
  Administered 2020-12-23 – 2020-12-28 (×6): 15 mg via ORAL
  Filled 2020-12-23 (×6): qty 1

## 2020-12-23 MED ORDER — OLANZAPINE 5 MG PO TABS
15.0000 mg | ORAL_TABLET | Freq: Every day | ORAL | Status: DC
  Administered 2020-12-23 – 2020-12-28 (×6): 15 mg via ORAL
  Filled 2020-12-23 (×6): qty 3

## 2020-12-23 MED ORDER — PREGABALIN 50 MG PO CAPS
50.0000 mg | ORAL_CAPSULE | Freq: Two times a day (BID) | ORAL | Status: DC
  Administered 2020-12-23 – 2020-12-29 (×13): 50 mg via ORAL
  Filled 2020-12-23 (×13): qty 1

## 2020-12-23 NOTE — Progress Notes (Signed)
Patient has been isolative to her room all evening.  She was laying in bed with eyes closed at this encounter. She declined to take her evening medication.  Stated that she was tired and wanted to go to sleep.  She declined SI  HI  AVH depression and anxiety at this encounter. She remains safe on the unit with 15 minute safety checks and encouraged to contact staff with any concerns.    Cleo Butler-Nicholson, LPN

## 2020-12-23 NOTE — Progress Notes (Signed)
D: Pt alert and oriented. Pt rates depression 9/10 and anxiety 9/10. Pt reports sleep last night as being poor. Pt did receive medications for sleep and did not find them helpful. Pt reports experiencing generalize pain, scheduled meds given. Pt denies experiencing any SI/HI, or AVH at this time.   This Clinical research associate did have a discussion with pt this morning suggesting she spending more time outside of room and how it would make her feel better especial in relation to her depression. Pt observed spending more time out of room than usual.   A: Scheduled medications administered to pt, per MD orders. Support and encouragement provided. Frequent verbal contact made. Routine safety checks conducted q15 minutes.   R: No adverse drug reactions noted. Pt verbally contracts for safety at this time. Pt complaint with medications. Pt interacts minimally with others on the unit. Pt remains safe at this time. Will continue to monitor.

## 2020-12-23 NOTE — Progress Notes (Signed)
[facility] MD Progress Note  12/23/2020 1:55 PM Leslie Wong  MRN:  [id number]  Principal Problem: Bipolar affective disorder, depressed, severe, with psychotic behavior (HCC) Diagnosis: Principal Problem:   Bipolar affective disorder, depressed, severe, with psychotic behavior (HCC) Active Problems:   Hypothyroidism   Gastroesophageal reflux disease with hiatal hernia   Cracking skin   Delusion (HCC)  Leslie Wong is a 64 y.o. female patient who presents to the [facility] unit for treatment of bipolar disorder exacerbation with suicidal thoghts in the context of somatic complaint (reported burning pain in her skin) that is likely delusional.  Interval History Patient was seen today for re-evaluation.  Nursing reports no events overnight. Patient has been medication compliant.    Subjective: Patient seen one-on-one at bedside today. She reports that she feels "terrible" this morning, and continues to report pain over entire body. She continues to have suicidal ideations without plan secondary to her pain.  She notes that she also continues to have a poor appetite along with depression and anxiety. She is open to trying an alternate medication. She denies  homicidal thoughts, any hallucinations.  Labs: CMP mostly within normal limits. No signs of refeeding syndrome- K 4.2, Phos 2.4, Mg 2.4   Total Time spent with patient: 30 minutes  Past Psychiatric History: Patient has a long history of bipolar disorder with multiple hospitalizations. Also chronic pain issues.   Past Medical History:  Past Medical History:  Diagnosis Date  . Back pain   . Cancer (HCC)    skin  . Depression    BAD  . Gastroparesis   . GERD (gastroesophageal reflux disease)   . Hypothyroidism   . Insomnia   . Migraines   . Parkinson disease (HCC)    per [facility], dx'd 2019  . Recurrent cold sores   . Shoulder bursitis   . Urge incontinence     Past Surgical History:  Procedure Laterality Date  .  ABDOMINAL HYSTERECTOMY  1999   total  . APPENDECTOMY    . ESOPHAGOGASTRODUODENOSCOPY  01/2006   negative except small hiatal hernia  . ESOPHAGOGASTRODUODENOSCOPY  02/24/2015   See report  . LAPAROSCOPY     for endometriosis  . OVARIAN CYST REMOVAL  1990   unilateral  . TONSILLECTOMY AND ADENOIDECTOMY     Family History:  Family History  Problem Relation Age of Onset  . Hypertension Mother   . Arthritis Mother   . Diabetes Mother   . Stroke Mother   . Cancer Father        lung  . Colon cancer Neg Hx   . Breast cancer Neg Hx    Family Psychiatric  History: Denies  Social History:  Social History   Substance and Sexual Activity  Alcohol Use No  . Alcohol/week: 0.0 standard drinks     Social History   Substance and Sexual Activity  Drug Use No    Social History   Socioeconomic History  . Marital status: Single    Spouse name: Not on file  . Number of children: Not on file  . Years of education: Not on file  . Highest education level: Not on file  Occupational History  . Not on file  Tobacco Use  . Smoking status: Current Some Day Smoker    Packs/day: 0.25    Years: 42.50    Pack years: 10.62    Types: Cigarettes  . Smokeless tobacco: Former Neurosurgeon  . Tobacco comment: declined  Vaping Use  .  Vaping Use: Never used  Substance and Sexual Activity  . Alcohol use: No    Alcohol/week: 0.0 standard drinks  . Drug use: No  . Sexual activity: Never  Other Topics Concern  . Not on file  Social History Narrative   Lives alone.     Has a dog names Dixie   Social Determinants of Health   Financial Resource Strain: Not on file  Food Insecurity: Not on file  Transportation Needs: Not on file  Physical Activity: Not on file  Stress: Not on file  Social Connections: Not on file   Additional Social History:    History of alcohol / drug use?: No history of alcohol / drug abuse                    Sleep: Fair  Appetite:  Poor  Current  Medications: Current Facility-Administered Medications  Medication Dose Route Frequency Provider Last Rate Last Admin  . acetaminophen (TYLENOL) tablet 650 mg  650 mg Oral Q6H PRN [name], [name], MD   650 mg at 12/17/20 0755  . albuterol (VENTOLIN HFA) 108 (90 Base) MCG/ACT inhaler 2 puff  2 puff Inhalation Q6H PRN [name], [name], MD      . alum & mag hydroxide-simeth (MAALOX/MYLANTA) 200-200-20 MG/5ML suspension 30 mL  30 mL Oral Q4H PRN [name], [name], MD      . diphenhydrAMINE (BENADRYL) capsule 50 mg  50 mg Oral Q6H PRN Salley Scarlet, MD       Or  . diphenhydrAMINE (BENADRYL) injection 50 mg  50 mg Intramuscular Q6H PRN Salley Scarlet, MD      . feeding supplement (ENSURE ENLIVE / ENSURE PLUS) liquid 237 mL  237 mL Oral BID BM Salley Scarlet, MD   237 mL at 12/23/20 1019  . hydrOXYzine (ATARAX/VISTARIL) tablet 50 mg  50 mg Oral TID PRN Salley Scarlet, MD   50 mg at 12/22/20 0758  . levothyroxine (SYNTHROID) tablet 88 mcg  88 mcg Oral Q0600 [name], [name], MD   88 mcg at 12/23/20 0603  . magnesium hydroxide (MILK OF MAGNESIA) suspension 30 mL  30 mL Oral Daily PRN [name], [name], MD      . mineral oil-hydrophilic petrolatum (AQUAPHOR) ointment   Topical Daily PRN [name], MD   1 application at [id number]  . mirtazapine (REMERON) tablet 15 mg  15 mg Oral QHS Salley Scarlet, MD      . multivitamin with minerals tablet 1 tablet  1 tablet Oral Daily Salley Scarlet, MD   1 tablet at 12/23/20 0754  . OLANZapine (ZYPREXA) tablet 15 mg  15 mg Oral QHS Salley Scarlet, MD      . oxyCODONE (Oxy IR/ROXICODONE) immediate release tablet 10 mg  10 mg Oral QID PRN Salley Scarlet, MD      . pantoprazole (PROTONIX) EC tablet 40 mg  40 mg Oral Daily [name], [name], MD   40 mg at 12/23/20 0754  . pregabalin (LYRICA) capsule 50 mg  50 mg Oral BID Salley Scarlet, MD   50 mg at 12/23/20 [id number]    Lab Results:  No results found for this or any previous visit (from the past 48  hour(s)).  Blood Alcohol level:  Lab Results  Component Value Date   [facility] <10 12/16/2020   ETH <10 [id number]    Metabolic Disorder Labs: Lab Results  Component Value Date   HGBA1C 5.4 10/13/2018  MPG 108.28 10/13/2018   MPG 111.15 08/29/2018   No results found for: PROLACTIN Lab Results  Component Value Date   CHOL 169 12/17/2020   TRIG 149 12/17/2020   HDL 33 (L) 12/17/2020   CHOLHDL 5.1 12/17/2020   VLDL 30 12/17/2020   LDLCALC 106 (H) 12/17/2020   LDLCALC 93 01/03/2020    Physical Findings: AIMS:  , ,  ,  ,    CIWA:    COWS:     Musculoskeletal: Strength & Muscle Tone: within normal limits Gait & Station: normal Patient leans: N/A  Psychiatric Specialty Exam: Physical Exam Vitals and nursing note reviewed.  Constitutional:      Appearance: Normal appearance.  HENT:     Head: Normocephalic and atraumatic.     Right Ear: External ear normal.     Left Ear: External ear normal.     Nose: Nose normal.     Mouth/Throat:     Mouth: Mucous membranes are moist.     Pharynx: Oropharynx is clear.  Eyes:     Extraocular Movements: Extraocular movements intact.     Conjunctiva/sclera: Conjunctivae normal.     Pupils: Pupils are equal, round, and reactive to light.  Cardiovascular:     Rate and Rhythm: Normal rate.     Pulses: Normal pulses.  Pulmonary:     Effort: Pulmonary effort is normal.     Breath sounds: Normal breath sounds.  Abdominal:     General: Abdomen is flat.     Palpations: Abdomen is soft.  Musculoskeletal:        General: No swelling. Normal range of motion.     Cervical back: Normal range of motion and neck supple.  Skin:    General: Skin is warm and dry.  Neurological:     General: No focal deficit present.     Mental Status: She is alert and oriented to person, place, and time.  Psychiatric:        Attention and Perception: Attention and perception normal.        Mood and Affect: Mood is depressed. Affect is tearful.        Speech:  Speech normal.        Behavior: Behavior is withdrawn.        Thought Content: Thought content is paranoid and delusional. Thought content includes suicidal ideation. Thought content does not include suicidal plan.        Cognition and Memory: Cognition and memory normal.        Judgment: Judgment normal.     Review of Systems  Constitutional: Negative for appetite change and fatigue.  HENT: Negative for rhinorrhea and sore throat.   Eyes: Negative for photophobia and visual disturbance.  Respiratory: Negative for cough and shortness of breath.   Cardiovascular: Negative for chest pain and palpitations.  Gastrointestinal: Negative for constipation, diarrhea, nausea and vomiting.  Endocrine: Negative for cold intolerance and heat intolerance.  Genitourinary: Negative for difficulty urinating and dysuria.  Musculoskeletal: Positive for arthralgias and myalgias.  Skin: Negative for color change and wound.  Allergic/Immunologic: Negative for environmental allergies.  Neurological: Negative for dizziness and headaches.  Hematological: Negative for adenopathy. Does not bruise/bleed easily.  Psychiatric/Behavioral: Positive for dysphoric mood, sleep disturbance and suicidal ideas. The patient is nervous/anxious.     Blood pressure 105/76, pulse 95, temperature 98.7 F (37.1 C), temperature source Oral, resp. rate 18, height 4\' 11"  (1.499 m), weight 50.8 kg, SpO2 95 %.Body mass index is 22.62 kg/m.  General Appearance: Casual  Eye Contact:  Good  Speech:  Normal Rate  Volume:  Decreased  Mood:  Anxious, Depressed, Dysphoric and Hopeless  Affect:  Congruent and Constricted  Thought Process:  Coherent  Orientation:  Full (Time, Place, and Person)  Thought Content:  Delusions and Rumination  Suicidal Thoughts:  Yes.  without intent/plan  Homicidal Thoughts:  No  Memory:  Immediate;   Fair Recent;   Fair  Judgement:  Other:  limited  Insight:  Shallow  Psychomotor Activity:  Decreased   Concentration:  Concentration: Poor  Recall:  [employer] of Knowledge:  Fair  Language:  Fair  Akathisia:  No  Handed:  Right  AIMS (if indicated):     Assets:  Desire for Improvement Financial Resources/Insurance Housing Social Support  ADL's:  Impaired  Cognition:  Impaired,  Mild  Sleep:  Number of Hours:  (8)     Treatment Plan Summary: Daily contact with patient to assess and evaluate symptoms and progress in treatment and Medication management  Patient is a 64 year old female with the above-stated past psychiatric history who is seen in follow-up.  Chart reviewed. Patient discussed with nursing. Patient continues to express depressed mood and passive death wishes due to her somatic complaints. Patient has exhibited hypotension and sedation with lyrica, will decrease dose to 50 mg BID today. She continues to endorse depression, anxiety, and poor appetite. Will trial Remeron 15 mg QHS to assist.  Plan:  -continue inpatient psych admission; 15-minute checks; daily contact with patient to assess and evaluate symptoms and progress in treatment; psychoeducation.  -continue scheduled medications: . feeding supplement  237 mL Oral BID BM  . levothyroxine  88 mcg Oral Q0600  . mirtazapine  15 mg Oral QHS  . multivitamin with minerals  1 tablet Oral Daily  . OLANZapine  15 mg Oral QHS  . pantoprazole  40 mg Oral Daily  . pregabalin  50 mg Oral BID    -continue PRN medications. acetaminophen, albuterol, alum & mag hydroxide-simeth, diphenhydrAMINE **OR** diphenhydrAMINE, hydrOXYzine, magnesium hydroxide, mineral oil-hydrophilic petrolatum, oxyCODONE  -Pertinent Labs: CMP today mostly WNL. Mg and Phos pending. Will continue to monitor for refeeding syndrome now that appetite has improved.     -Consults: No new consults placed since yesterday    -Disposition: Patient is not ready for discharge yet. All necessary aftercare will be arranged prior to discharge Likely d/c home with  outpatient psych follow-up.  -  I certify that the patient does need, on a daily basis, active treatment furnished directly by or requiring the supervision of inpatient psychiatric facility personnel.   Salley Scarlet, MD 12/23/2020, 1:55 PM

## 2020-12-23 NOTE — Tx Team (Signed)
Interdisciplinary Treatment and Diagnostic Plan Update  12/23/2020 Time of Session: 8:30AM Leslie Wong MRN: [id number]  Principal Diagnosis: Bipolar affective disorder, depressed, severe, with psychotic behavior ([place])  Secondary Diagnoses: Principal Problem:   Bipolar affective disorder, depressed, severe, with psychotic behavior ([place]) Active Problems:   Hypothyroidism   Gastroesophageal reflux disease with hiatal hernia   Cracking skin   Delusion ([place])   Current Medications:  Current Facility-Administered Medications  Medication Dose Route Frequency Provider Last Rate Last Admin  . acetaminophen (TYLENOL) tablet 650 mg  650 mg Oral Q6H PRN [name], [name], MD   650 mg at 12/17/20 0755  . albuterol (VENTOLIN HFA) 108 (90 Base) MCG/ACT inhaler 2 puff  2 puff Inhalation Q6H PRN [name], [name], MD      . alum & mag hydroxide-simeth (MAALOX/MYLANTA) 200-200-20 MG/5ML suspension 30 mL  30 mL Oral Q4H PRN [name], [name], MD      . diphenhydrAMINE (BENADRYL) capsule 50 mg  50 mg Oral Q6H PRN Salley Scarlet, MD       Or  . diphenhydrAMINE (BENADRYL) injection 50 mg  50 mg Intramuscular Q6H PRN Salley Scarlet, MD      . feeding supplement (ENSURE ENLIVE / ENSURE PLUS) liquid 237 mL  237 mL Oral BID BM Salley Scarlet, MD   237 mL at 12/22/20 1014  . hydrOXYzine (ATARAX/VISTARIL) tablet 50 mg  50 mg Oral TID PRN Salley Scarlet, MD   50 mg at 12/22/20 0758  . levothyroxine (SYNTHROID) tablet 88 mcg  88 mcg Oral Q0600 [name], [name], MD   88 mcg at 12/23/20 0603  . magnesium hydroxide (MILK OF MAGNESIA) suspension 30 mL  30 mL Oral Daily PRN [name], [name], MD      . mineral oil-hydrophilic petrolatum (AQUAPHOR) ointment   Topical Daily PRN [name], MD   1 application at [id number]  . multivitamin with minerals tablet 1 tablet  1 tablet Oral Daily Salley Scarlet, MD   1 tablet at 12/23/20 0754  . OLANZapine (ZYPREXA) tablet 20 mg  20 mg Oral QHS Salley Scarlet,  MD   20 mg at 12/22/20 2140  . oxyCODONE (Oxy IR/ROXICODONE) immediate release tablet 10 mg  10 mg Oral QID PRN Salley Scarlet, MD      . pantoprazole (PROTONIX) EC tablet 40 mg  40 mg Oral Daily [name], [name], MD   40 mg at 12/23/20 0754  . pregabalin (LYRICA) capsule 50 mg  50 mg Oral BID Salley Scarlet, MD   50 mg at 12/23/20 0754  . traZODone (DESYREL) tablet 100 mg  100 mg Oral QHS [name], [name], MD   100 mg at 12/22/20 2140   PTA Medications: Medications Prior to Admission  Medication Sig Dispense Refill Last Dose  . albuterol (VENTOLIN HFA) 108 (90 Base) MCG/ACT inhaler Inhale 2 puffs into the lungs every 6 (six) hours as needed for wheezing or shortness of breath. 18 g 11   . gabapentin (NEURONTIN) 300 MG capsule Take 1 capsule (300 mg total) by mouth 3 (three) times daily as needed (Skin pain). 90 capsule 0   . levothyroxine (SYNTHROID) 88 MCG tablet Take 1 tablet (88 mcg total) by mouth daily at 6 (six) AM. 90 tablet 1   . olanzapine zydis (ZYPREXA ZYDIS) 15 MG disintegrating tablet Take 1 tablet (15 mg total) by mouth at bedtime. 90 tablet 0   . omeprazole (PRILOSEC) 20 MG capsule Take 1  capsule (20 mg total) by mouth 2 (two) times daily before a meal. 180 capsule 1   . traZODone (DESYREL) 100 MG tablet Take 1 tablet (100 mg total) by mouth at bedtime. 90 tablet 0   . valACYclovir (VALTREX) 500 MG tablet TAKE 1 TABLET(500 MG) BY MOUTH TWICE DAILY IF NEEDED FOR FEVER BLISTER       Patient Stressors: Other: Pain, burning sensation over entire body  Patient Strengths: Capable of independent living General fund of knowledge Supportive family/friends  Treatment Modalities: Medication Management, Group therapy, Case management,  1 to 1 session with clinician, Psychoeducation, Recreational therapy.   Physician Treatment Plan for Primary Diagnosis: Bipolar affective disorder, depressed, severe, with psychotic behavior (HCC) Long Term Goal(s): Improvement in symptoms so as  ready for discharge Improvement in symptoms so as ready for discharge   Short Term Goals: Ability to identify changes in lifestyle to reduce recurrence of condition will improve Ability to verbalize feelings will improve Ability to disclose and discuss suicidal ideas Ability to demonstrate self-control will improve Ability to identify and develop effective coping behaviors will improve Ability to identify changes in lifestyle to reduce recurrence of condition will improve Ability to verbalize feelings will improve Ability to disclose and discuss suicidal ideas Ability to demonstrate self-control will improve Ability to identify and develop effective coping behaviors will improve  Medication Management: Evaluate patient's response, side effects, and tolerance of medication regimen.  Therapeutic Interventions: 1 to 1 sessions, Unit Group sessions and Medication administration.  Evaluation of Outcomes: Not Progressing  Physician Treatment Plan for Secondary Diagnosis: Principal Problem:   Bipolar affective disorder, depressed, severe, with psychotic behavior (HCC) Active Problems:   Hypothyroidism   Gastroesophageal reflux disease with hiatal hernia   Cracking skin   Delusion (HCC)  Long Term Goal(s): Improvement in symptoms so as ready for discharge Improvement in symptoms so as ready for discharge   Short Term Goals: Ability to identify changes in lifestyle to reduce recurrence of condition will improve Ability to verbalize feelings will improve Ability to disclose and discuss suicidal ideas Ability to demonstrate self-control will improve Ability to identify and develop effective coping behaviors will improve Ability to identify changes in lifestyle to reduce recurrence of condition will improve Ability to verbalize feelings will improve Ability to disclose and discuss suicidal ideas Ability to demonstrate self-control will improve Ability to identify and develop effective  coping behaviors will improve     Medication Management: Evaluate patient's response, side effects, and tolerance of medication regimen.  Therapeutic Interventions: 1 to 1 sessions, Unit Group sessions and Medication administration.  Evaluation of Outcomes: Not Progressing   RN Treatment Plan for Primary Diagnosis: Bipolar affective disorder, depressed, severe, with psychotic behavior (HCC) Long Term Goal(s): Knowledge of disease and therapeutic regimen to maintain health will improve  Short Term Goals: Ability to remain free from injury will improve, Ability to verbalize frustration and anger appropriately will improve, Ability to demonstrate self-control, Ability to participate in decision making will improve, Ability to verbalize feelings will improve, Ability to disclose and discuss suicidal ideas, Ability to identify and develop effective coping behaviors will improve and Compliance with prescribed medications will improve  Medication Management: RN will administer medications as ordered by provider, will assess and evaluate patient's response and provide education to patient for prescribed medication. RN will report any adverse and/or side effects to prescribing provider.  Therapeutic Interventions: 1 on 1 counseling sessions, Psychoeducation, Medication administration, Evaluate responses to treatment, Monitor vital signs and CBGs as  ordered, Perform/monitor CIWA, COWS, AIMS and Fall Risk screenings as ordered, Perform wound care treatments as ordered.  Evaluation of Outcomes: Not Progressing   LCSW Treatment Plan for Primary Diagnosis: Bipolar affective disorder, depressed, severe, with psychotic behavior ([place]) Long Term Goal(s): Safe transition to appropriate next level of care at discharge, Engage patient in therapeutic group addressing interpersonal concerns.  Short Term Goals: Engage patient in aftercare planning with referrals and resources, Increase social support, Increase  ability to appropriately verbalize feelings, Increase emotional regulation, Facilitate acceptance of mental health diagnosis and concerns, Identify triggers associated with mental health/substance abuse issues and Increase skills for wellness and recovery  Therapeutic Interventions: Assess for all discharge needs, 1 to 1 time with Social worker, Explore available resources and support systems, Assess for adequacy in community support network, Educate family and significant other(s) on suicide prevention, Complete Psychosocial Assessment, Interpersonal group therapy.  Evaluation of Outcomes: Not Progressing   Progress in Treatment: Attending groups: No. Participating in groups: No. Taking medication as prescribed: Yes. Toleration medication: Yes. Family/Significant other contact made: No, will contact:  patient declined familial/collateral contact. Patient understands diagnosis: No. Discussing patient identified problems/goals with staff: Yes. Medical problems stabilized or resolved: Yes. Denies suicidal/homicidal ideation: Yes. Issues/concerns per patient self-inventory: No. Other: None.  New problem(s) identified: No, Describe:  none.  New Short Term/Long Term Goal(s): elimination of symptoms of psychosis, medication management for mood stabilization; elimination of SI thoughts; development of comprehensive mental wellness/sobriety plan. 12/23/20 Update: No changes at this time.  Patient Goals: "I don't know right now." Patient expressed desire to not feel so sad and to work on some of the skin burning with the provider on the day prior. 12/23/20 Update: No changes at this time.    Discharge Plan or Barriers: Patient plans to discharge home with continued outpatient treatment through [facility] in [place], [place]. 12/23/20 Update: No changes at this time.  Reason for Continuation of Hospitalization: Anxiety Delusions  Medication stabilization Suicidal ideation  Estimated Length of Stay:  TBD  Attendees: Patient:  12/23/2020 9:36 AM  Physician: Selina Cooley, MD 12/23/2020 9:36 AM  Nursing: 12/23/2020 9:36 AM  RN Care Manager: 12/23/2020 9:36 AM  Social Worker: Chalmers Guest. Guerry Bruin, MSW, [place], [place] 12/23/2020 9:36 AM  Recreational Therapist: 12/23/2020 9:36 AM  Other: Paulla Dolly, MSW, [place], [place] 12/23/2020 9:36 AM  Other: Assunta Curtis, MSW, LCSW 12/23/2020 9:36 AM  Other: 12/23/2020 9:36 AM    Scribe for Treatment Team: Shirl Harris, LCSW 12/23/2020 9:36 AM

## 2020-12-23 NOTE — [facility] Group Notes (Signed)
CSW did not conduct group due to positive COVID case on unit. CSW to follow-up with leadership. Group will resume when proper safety precautions are completed and leadership recommends return to normal operations.   Corky Crafts, MSW, LCSWA, LCASA 12/23/2020 1:12 PM

## 2020-12-23 NOTE — Progress Notes (Signed)
Recreation Therapy Notes  Date: 12/23/2020  Time: 9:30 am   Location: Craft room   Behavioral response: Appropriate  Intervention Topic: Problem Solving    Discussion/Intervention:  Group content on today was focused on problem solving. The group described what problem solving is. Patients expressed how problems affect them and how they deal with problems. Individuals identified healthy ways to deal with problems. Patients explained what normally happens to them when they do not deal with problems. The group expressed reoccurring problems for them. The group participated in the intervention "Ways to Solve problems" where patients were given a chance to explore different ways to solve problems.   Clinical Observations/Feedback: Patient came to group and was focused on what peers and staff had to say about problem solving. Individual was social with peers and staff while participating in the intervention.  Carlea Badour LRT/CTRS         Keyia Moretto 12/23/2020 11:57 AM

## 2020-12-24 LAB — SARS CORONAVIRUS 2 (TAT 6-24 HRS): SARS Coronavirus 2: NEGATIVE

## 2020-12-24 NOTE — Progress Notes (Signed)
[facility] MD Progress Note  12/24/2020 10:02 AM Leslie Wong  MRN:  [id number]  Principal Problem: Bipolar affective disorder, depressed, severe, with psychotic behavior ([place]) Diagnosis: Principal Problem:   Bipolar affective disorder, depressed, severe, with psychotic behavior ([place]) Active Problems:   Hypothyroidism   Gastroesophageal reflux disease with hiatal hernia   Cracking skin   Delusion ([place])  Leslie Wong is a 64 y.o. female patient who presents to the [facility] unit for treatment of bipolar disorder exacerbation with suicidal thoghts in the context of somatic complaint (reported burning pain in her skin) that is likely delusional.  Interval History Patient was seen today for re-evaluation.  Nursing reports no acute events overnight, did request PRN medication for anxiety. Patient has been medication compliant.    Subjective: Patient seen one-on-one at bedside today. She reports that she feels "terrible" this morning, and continues to report pain over entire body. She continues to have suicidal ideations without plan secondary to her pain.  She notes that she also continues to have a poor appetite along with depression and anxiety. She reports sleep was somewhat better last night. She reports no side effects to Remeron started last night. Will continue at this time for depression, anxiety, poor appetite, and poor sleep. She denies all symptoms of covid, and tested negative overnight.    Labs: Covid negative    Total Time spent with patient: 30 minutes  Past Psychiatric History: Patient has a long history of bipolar disorder with multiple hospitalizations. Also chronic pain issues.   Past Medical History:  Past Medical History:  Diagnosis Date  . Back pain   . Cancer ([place])    skin  . Depression    BAD  . Gastroparesis   . GERD (gastroesophageal reflux disease)   . Hypothyroidism   . Insomnia   . Migraines   . Parkinson disease ([place])    per [facility], dx'd 2019  .  Recurrent cold sores   . Shoulder bursitis   . Urge incontinence     Past Surgical History:  Procedure Laterality Date  . ABDOMINAL HYSTERECTOMY  1999   total  . APPENDECTOMY    . ESOPHAGOGASTRODUODENOSCOPY  01/2006   negative except small hiatal hernia  . ESOPHAGOGASTRODUODENOSCOPY  02/24/2015   See report  . LAPAROSCOPY     for endometriosis  . OVARIAN CYST REMOVAL  1990   unilateral  . TONSILLECTOMY AND ADENOIDECTOMY     Family History:  Family History  Problem Relation Age of Onset  . Hypertension Mother   . Arthritis Mother   . Diabetes Mother   . Stroke Mother   . Cancer Father        lung  . Colon cancer Neg Hx   . Breast cancer Neg Hx    Family Psychiatric  History: Denies  Social History:  Social History   Substance and Sexual Activity  Alcohol Use No  . Alcohol/week: 0.0 standard drinks     Social History   Substance and Sexual Activity  Drug Use No    Social History   Socioeconomic History  . Marital status: Single    Spouse name: Not on file  . Number of children: Not on file  . Years of education: Not on file  . Highest education level: Not on file  Occupational History  . Not on file  Tobacco Use  . Smoking status: Current Some Day Smoker    Packs/day: 0.25    Years: 42.50    Pack years:  10.62    Types: Cigarettes  . Smokeless tobacco: Former Systems developer  . Tobacco comment: declined  Vaping Use  . Vaping Use: Never used  Substance and Sexual Activity  . Alcohol use: No    Alcohol/week: 0.0 standard drinks  . Drug use: No  . Sexual activity: Never  Other Topics Concern  . Not on file  Social History Narrative   Lives alone.     Has a dog names Dixie   Social Determinants of Health   Financial Resource Strain: Not on file  Food Insecurity: Not on file  Transportation Needs: Not on file  Physical Activity: Not on file  Stress: Not on file  Social Connections: Not on file   Additional Social History:    History of alcohol /  drug use?: No history of alcohol / drug abuse                    Sleep: Fair  Appetite:  Poor  Current Medications: Current Facility-Administered Medications  Medication Dose Route Frequency Provider Last Rate Last Admin  . acetaminophen (TYLENOL) tablet 650 mg  650 mg Oral Q6H PRN [name], [name], MD   650 mg at 12/17/20 0755  . albuterol (VENTOLIN HFA) 108 (90 Base) MCG/ACT inhaler 2 puff  2 puff Inhalation Q6H PRN [name], [name], MD      . alum & mag hydroxide-simeth (MAALOX/MYLANTA) 200-200-20 MG/5ML suspension 30 mL  30 mL Oral Q4H PRN [name], [name], MD      . diphenhydrAMINE (BENADRYL) capsule 50 mg  50 mg Oral Q6H PRN Salley Scarlet, MD   50 mg at 12/24/20 0131   Or  . diphenhydrAMINE (BENADRYL) injection 50 mg  50 mg Intramuscular Q6H PRN Salley Scarlet, MD      . feeding supplement (ENSURE ENLIVE / ENSURE PLUS) liquid 237 mL  237 mL Oral BID BM Salley Scarlet, MD   237 mL at 12/23/20 1400  . hydrOXYzine (ATARAX/VISTARIL) tablet 50 mg  50 mg Oral TID PRN Salley Scarlet, MD   50 mg at 12/24/20 0131  . levothyroxine (SYNTHROID) tablet 88 mcg  88 mcg Oral Q0600 [name], [name], MD   88 mcg at 12/24/20 0617  . magnesium hydroxide (MILK OF MAGNESIA) suspension 30 mL  30 mL Oral Daily PRN [name], [name], MD      . mineral oil-hydrophilic petrolatum (AQUAPHOR) ointment   Topical Daily PRN [name], MD   1 application at [id number]  . mirtazapine (REMERON) tablet 15 mg  15 mg Oral QHS Salley Scarlet, MD   15 mg at 12/23/20 2120  . multivitamin with minerals tablet 1 tablet  1 tablet Oral Daily Salley Scarlet, MD   1 tablet at 12/24/20 0900  . OLANZapine (ZYPREXA) tablet 15 mg  15 mg Oral QHS Salley Scarlet, MD   15 mg at 12/23/20 2120  . oxyCODONE (Oxy IR/ROXICODONE) immediate release tablet 10 mg  10 mg Oral QID PRN Salley Scarlet, MD      . pantoprazole (PROTONIX) EC tablet 40 mg  40 mg Oral Daily [name], [name], MD   40 mg at 12/24/20 0900  .  pregabalin (LYRICA) capsule 50 mg  50 mg Oral BID Salley Scarlet, MD   50 mg at 12/24/20 0900    Lab Results:  Results for orders placed or performed during the hospital encounter of 12/16/20 (from the past 48 hour(s))  SARS CORONAVIRUS 2 (  TAT 6-24 HRS) Nasopharyngeal Nasopharyngeal Swab     Status: None   Collection Time: 12/23/20 12:30 PM   Specimen: Nasopharyngeal Swab  Result Value Ref Range   SARS Coronavirus 2 NEGATIVE NEGATIVE    Comment: (NOTE) SARS-CoV-2 target nucleic acids are NOT DETECTED.  The SARS-CoV-2 RNA is generally detectable in upper and lower respiratory specimens during the acute phase of infection. Negative results do not preclude SARS-CoV-2 infection, do not rule out co-infections with other pathogens, and should not be used as the sole basis for treatment or other patient management decisions. Negative results must be combined with clinical observations, patient history, and epidemiological information. The expected result is Negative.  Fact Sheet for Patients: SugarRoll.be  Fact Sheet for Healthcare Providers: https://www.woods-mathews.com/  This test is not yet approved or cleared by the [place] FDA and  has been authorized for detection and/or diagnosis of SARS-CoV-2 by FDA under an Emergency Use Authorization (EUA). This EUA will remain  in effect (meaning this test can be used) for the duration of the COVID-19 declaration under Se ction 564(b)(1) of the Act, 21 U.S.C. section 360bbb-3(b)(1), unless the authorization is terminated or revoked sooner.  Performed at [facility], [place] [address], [place], [place] [address]     Blood Alcohol level:  Lab Results  Component Value Date   [facility] <10 12/16/2020   ETH <10 [id number]    Metabolic Disorder Labs: Lab Results  Component Value Date   HGBA1C 5.4 10/13/2018   MPG 108.28 10/13/2018   MPG 111.15 08/29/2018   No results found for:  PROLACTIN Lab Results  Component Value Date   CHOL 169 12/17/2020   TRIG 149 12/17/2020   HDL 33 (L) 12/17/2020   CHOLHDL 5.1 12/17/2020   VLDL 30 12/17/2020   LDLCALC 106 (H) 12/17/2020   LDLCALC 93 01/03/2020    Physical Findings: AIMS:  , ,  ,  ,    CIWA:    COWS:     Musculoskeletal: Strength & Muscle Tone: within normal limits Gait & Station: normal Patient leans: N/A  Psychiatric Specialty Exam: Physical Exam Vitals and nursing note reviewed.  Constitutional:      Appearance: Normal appearance.  HENT:     Head: Normocephalic and atraumatic.     Right Ear: External ear normal.     Left Ear: External ear normal.     Nose: Nose normal.     Mouth/Throat:     Mouth: Mucous membranes are moist.     Pharynx: Oropharynx is clear.  Eyes:     Extraocular Movements: Extraocular movements intact.     Conjunctiva/sclera: Conjunctivae normal.     Pupils: Pupils are equal, round, and reactive to light.  Cardiovascular:     Rate and Rhythm: Normal rate.     Pulses: Normal pulses.  Pulmonary:     Effort: Pulmonary effort is normal.     Breath sounds: Normal breath sounds.  Abdominal:     General: Abdomen is flat.     Palpations: Abdomen is soft.  Musculoskeletal:        General: No swelling. Normal range of motion.     Cervical back: Normal range of motion and neck supple.  Skin:    General: Skin is warm and dry.  Neurological:     General: No focal deficit present.     Mental Status: She is alert and oriented to person, place, and time.  Psychiatric:        Attention and  Perception: Attention and perception normal.        Mood and Affect: Mood is depressed. Affect is tearful.        Speech: Speech normal.        Behavior: Behavior is withdrawn.        Thought Content: Thought content is paranoid and delusional. Thought content includes suicidal ideation. Thought content does not include suicidal plan.        Cognition and Memory: Cognition and memory normal.         Judgment: Judgment normal.     Review of Systems  Constitutional: Negative for appetite change and fatigue.  HENT: Negative for rhinorrhea and sore throat.   Eyes: Negative for photophobia and visual disturbance.  Respiratory: Negative for cough and shortness of breath.   Cardiovascular: Negative for chest pain and palpitations.  Gastrointestinal: Negative for constipation, diarrhea, nausea and vomiting.  Endocrine: Negative for cold intolerance and heat intolerance.  Genitourinary: Negative for difficulty urinating and dysuria.  Musculoskeletal: Positive for arthralgias and myalgias.  Skin: Negative for color change and wound.  Allergic/Immunologic: Negative for environmental allergies.  Neurological: Negative for dizziness and headaches.  Hematological: Negative for adenopathy. Does not bruise/bleed easily.  Psychiatric/Behavioral: Positive for dysphoric mood, sleep disturbance and suicidal ideas. The patient is nervous/anxious.     Blood pressure 105/76, pulse 95, temperature 98.7 F (37.1 C), temperature source Oral, resp. rate 18, height 4\' 11"  (1.499 m), weight 50.8 kg, SpO2 95 %.Body mass index is 22.62 kg/m.  General Appearance: Casual  Eye Contact:  Good  Speech:  Normal Rate  Volume:  Decreased  Mood:  Anxious, Depressed, Dysphoric and Hopeless  Affect:  Congruent and Constricted  Thought Process:  Coherent  Orientation:  Full (Time, Place, and Person)  Thought Content:  Delusions and Rumination  Suicidal Thoughts:  Yes.  without intent/plan  Homicidal Thoughts:  No  Memory:  Immediate;   Fair Recent;   Fair  Judgement:  Other:  limited  Insight:  Shallow  Psychomotor Activity:  Decreased  Concentration:  Concentration: Poor  Recall:  of Knowledge:  Fair  Language:  Fair  Akathisia:  No  Handed:  Right  AIMS (if indicated):     Assets:  Desire for Improvement Financial Resources/Insurance Housing Social Support  ADL's:  Impaired  Cognition:   Impaired,  Mild  Sleep:  Number of Hours: 7     Treatment Plan Summary: Daily contact with patient to assess and evaluate symptoms and progress in treatment and Medication management  Patient is a 64 year old female with the above-stated past psychiatric history who is seen in follow-up.  Chart reviewed. Patient discussed with nursing. Patient continues to express depressed mood and passive death wishes due to her somatic complaints. Continue Lyrica 50 mg BID today, patient unable to tolerate higher doses. She continues to endorse depression, anxiety, and poor appetite. Continue Remeron 15 mg QHS to assist.  Plan:  -continue inpatient psych admission; 15-minute checks; daily contact with patient to assess and evaluate symptoms and progress in treatment; psychoeducation.  -continue scheduled medications: . feeding supplement  237 mL Oral BID BM  . levothyroxine  88 mcg Oral Q0600  . mirtazapine  15 mg Oral QHS  . multivitamin with minerals  1 tablet Oral Daily  . OLANZapine  15 mg Oral QHS  . pantoprazole  40 mg Oral Daily  . pregabalin  50 mg Oral BID    -continue PRN medications. acetaminophen, albuterol, alum & mag hydroxide-simeth,  diphenhydrAMINE **OR** diphenhydrAMINE, hydrOXYzine, magnesium hydroxide, mineral oil-hydrophilic petrolatum, oxyCODONE  -Pertinent Labs: CMP today mostly WNL. Mg and Phos pending. Will continue to monitor for refeeding syndrome now that appetite has improved.     -Consults: No new consults placed since yesterday    -Disposition: Patient is not ready for discharge yet. All necessary aftercare will be arranged prior to discharge Likely d/c home with outpatient psych follow-up.  -  I certify that the patient does need, on a daily basis, active treatment furnished directly by or requiring the supervision of inpatient psychiatric facility personnel.   Salley Scarlet, MD 12/24/2020, 10:02 AM

## 2020-12-24 NOTE — Plan of Care (Signed)
°  Problem: Group Participation °Goal: STG - Patient will engage in groups without prompting or encouragement from LRT x3 group sessions within 5 recreation therapy group sessions °Description: STG - Patient will engage in groups without prompting or encouragement from LRT x3 group sessions within 5 recreation therapy group sessions °Outcome: Progressing °  °

## 2020-12-24 NOTE — Progress Notes (Signed)
Pleasant and cooperative. Voiced no concerns or complaints at this time. Up for snacks in room eating and drinking. Denies SI, HI, AVH. Endorses depression and anxiety. Voiced no complaints of pain at this time.  Encouragement and support provided. Safety checks maintained. Medications given as prescribed. Pt receptive and remains safe on unit with q 15 min checks.

## 2020-12-24 NOTE — Plan of Care (Signed)
  Problem: Education: Goal: Emotional status will improve Outcome: Progressing Goal: Mental status will improve Outcome: Progressing   Problem: Safety: Goal: Periods of time without injury will increase Outcome: Progressing   

## 2020-12-24 NOTE — Progress Notes (Signed)
Pt is alert and oriented to person, place, time and situation. Pt is calm, cooperative, denies suicidal and homicidal ideation, denies hallucinations, reports some feelings of depression and anxiety. Pt's affect flat, pt is cooperative with airborne and contact precautions. Pt is able to make her needs known, asked for ice water, has been resting in bed quietly or cleaning and organizing her room. Pt reports she did not sleep well last night and is not sure why. Pt continues to c/o of chronic "all over" burning pain. Pt given scheduled medications for this pain. Will continue to monitor pt per Q15 minute face checks and monitor for safety and progress.

## 2020-12-24 NOTE — Plan of Care (Signed)
  Problem: Safety: Goal: Periods of time without injury will increase Outcome: Progressing   Problem: Education: Goal: Ability to state activities that reduce stress will improve Outcome: Progressing   

## 2020-12-24 NOTE — [facility] Group Notes (Signed)
Group not held due to Covid restrictions. ° °Leadership aware and recommended group not be held. ° °Trejuan Matherne, MSW, LCSW °12/24/2020 12:57 PM °

## 2020-12-24 NOTE — Progress Notes (Signed)
Recreation Therapy Notes  Patient greeted and given Recreation therapy/Leisure packet by Clinical research associate due to isolation precautions.    Leslie Wong  LRT/CTRS        Bee Marchiano 12/24/2020 11:00 AM

## 2020-12-24 NOTE — Progress Notes (Signed)
Patient slept most of shift. Did wake up requesting anti-anxiety medication.  Given with good relief. Voiced no other complaints or concerns. Denies SI, HI, AVH at present. Encouragement and support provided. Safety checks maintained. Medications given as prescribed. Pt receptive and remains safe on unit with q 15 min checks.

## 2020-12-25 DIAGNOSIS — F315 Bipolar disorder, current episode depressed, severe, with psychotic features: Secondary | ICD-10-CM | POA: Diagnosis not present

## 2020-12-25 MED ORDER — VALACYCLOVIR HCL 500 MG PO TABS
500.0000 mg | ORAL_TABLET | Freq: Every day | ORAL | Status: DC
  Administered 2020-12-25 – 2020-12-29 (×5): 500 mg via ORAL
  Filled 2020-12-25 (×5): qty 1

## 2020-12-25 NOTE — Progress Notes (Addendum)
Pt has been alert and oriented to person, place, time and situation. Pt is calm, cooperative, pleasant, denies suicidal and homicidal ideation. Pt denies feelings of depression, reports anxiety. Pt is medication compliant. Pt has been on airborne and contact precautions per MD orders, and has been compliant with staff directions. Pt has a blunted affect, eye contact has been poor. Pt appetite has been good. Will continue to monitor pt per Q15 minute face checks and monitor for safety and progress. No signs/symptoms of covid infection noted to be present, none reported by pt.

## 2020-12-25 NOTE — [facility] Counselor (Signed)
CSW did not conduct group due to positive COVID case on unit. CSW to follow-up with leadership. Group will resume when proper safety precautions are completed and leadership recommends return to normal operations.   Signed:  Corky Crafts, MSW, [place], LCASA 12/25/2020 11:17 AM

## 2020-12-25 NOTE — Progress Notes (Signed)
[facility] MD Progress Note  12/25/2020 3:38 PM Leslie Wong  MRN:  [id number] Subjective: Follow-up for this patient with bipolar disorder.  Patient seen in her room.  She continues to state that her skin feels like it is burning all the time.  Nursing notes that she stays very isolated.  Mood is still dysphoric.  Denies suicidal ideation.  Denies hallucinations.  Not very interactive however and insight is limited Principal Problem: Bipolar affective disorder, depressed, severe, with psychotic behavior (HCC) Diagnosis: Principal Problem:   Bipolar affective disorder, depressed, severe, with psychotic behavior (HCC) Active Problems:   Hypothyroidism   Gastroesophageal reflux disease with hiatal hernia   Cracking skin   Delusion (HCC)  Total Time spent with patient: 30 minutes  Past Psychiatric History: Past history of bipolar disorder with recurrent psychotic episodes  Past Medical History:  Past Medical History:  Diagnosis Date  . Back pain   . Cancer (HCC)    skin  . Depression    BAD  . Gastroparesis   . GERD (gastroesophageal reflux disease)   . Hypothyroidism   . Insomnia   . Migraines   . Parkinson disease (HCC)    per [facility], dx'd 2019  . Recurrent cold sores   . Shoulder bursitis   . Urge incontinence     Past Surgical History:  Procedure Laterality Date  . ABDOMINAL HYSTERECTOMY  1999   total  . APPENDECTOMY    . ESOPHAGOGASTRODUODENOSCOPY  01/2006   negative except small hiatal hernia  . ESOPHAGOGASTRODUODENOSCOPY  02/24/2015   See report  . LAPAROSCOPY     for endometriosis  . OVARIAN CYST REMOVAL  1990   unilateral  . TONSILLECTOMY AND ADENOIDECTOMY     Family History:  Family History  Problem Relation Age of Onset  . Hypertension Mother   . Arthritis Mother   . Diabetes Mother   . Stroke Mother   . Cancer Father        lung  . Colon cancer Neg Hx   . Breast cancer Neg Hx    Family Psychiatric  History: See previous Social  History:  Social History   Substance and Sexual Activity  Alcohol Use No  . Alcohol/week: 0.0 standard drinks     Social History   Substance and Sexual Activity  Drug Use No    Social History   Socioeconomic History  . Marital status: Single    Spouse name: Not on file  . Number of children: Not on file  . Years of education: Not on file  . Highest education level: Not on file  Occupational History  . Not on file  Tobacco Use  . Smoking status: Current Some Day Smoker    Packs/day: 0.25    Years: 42.50    Pack years: 10.62    Types: Cigarettes  . Smokeless tobacco: Former Neurosurgeon  . Tobacco comment: declined  Vaping Use  . Vaping Use: Never used  Substance and Sexual Activity  . Alcohol use: No    Alcohol/week: 0.0 standard drinks  . Drug use: No  . Sexual activity: Never  Other Topics Concern  . Not on file  Social History Narrative   Lives alone.     Has a dog names Dixie   Social Determinants of Health   Financial Resource Strain: Not on file  Food Insecurity: Not on file  Transportation Needs: Not on file  Physical Activity: Not on file  Stress: Not on file  Social Connections: Not  on file   Additional Social History:    History of alcohol / drug use?: No history of alcohol / drug abuse                    Sleep: Fair  Appetite:  Fair  Current Medications: Current Facility-Administered Medications  Medication Dose Route Frequency Provider Last Rate Last Admin  . acetaminophen (TYLENOL) tablet 650 mg  650 mg Oral Q6H PRN Ameira Alessandrini, [name], MD   650 mg at 12/17/20 0755  . albuterol (VENTOLIN HFA) 108 (90 Base) MCG/ACT inhaler 2 puff  2 puff Inhalation Q6H PRN Amana Bouska T, MD      . alum & mag hydroxide-simeth (MAALOX/MYLANTA) 200-200-20 MG/5ML suspension 30 mL  30 mL Oral Q4H PRN Wynema Garoutte T, MD      . diphenhydrAMINE (BENADRYL) capsule 50 mg  50 mg Oral Q6H PRN Salley Scarlet, MD   50 mg at 12/24/20 0131   Or  . diphenhydrAMINE  (BENADRYL) injection 50 mg  50 mg Intramuscular Q6H PRN Salley Scarlet, MD      . feeding supplement (ENSURE ENLIVE / ENSURE PLUS) liquid 237 mL  237 mL Oral BID BM Salley Scarlet, MD   237 mL at 12/23/20 1400  . hydrOXYzine (ATARAX/VISTARIL) tablet 50 mg  50 mg Oral TID PRN Salley Scarlet, MD   50 mg at 12/24/20 0131  . levothyroxine (SYNTHROID) tablet 88 mcg  88 mcg Oral Q0600 Mayank Teuscher, [name], MD   88 mcg at 12/25/20 [id number]  . magnesium hydroxide (MILK OF MAGNESIA) suspension 30 mL  30 mL Oral Daily PRN Larissa Pegg T, MD      . mineral oil-hydrophilic petrolatum (AQUAPHOR) ointment   Topical Daily PRN [name], MD   1 application at [id number]  . mirtazapine (REMERON) tablet 15 mg  15 mg Oral QHS Salley Scarlet, MD   15 mg at 12/24/20 2059  . multivitamin with minerals tablet 1 tablet  1 tablet Oral Daily Salley Scarlet, MD   1 tablet at 12/25/20 0825  . OLANZapine (ZYPREXA) tablet 15 mg  15 mg Oral QHS Salley Scarlet, MD   15 mg at 12/24/20 2059  . oxyCODONE (Oxy IR/ROXICODONE) immediate release tablet 10 mg  10 mg Oral QID PRN Salley Scarlet, MD      . pantoprazole (PROTONIX) EC tablet 40 mg  40 mg Oral Daily Kimiko Common, [name], MD   40 mg at 12/25/20 [id number]  . pregabalin (LYRICA) capsule 50 mg  50 mg Oral BID Salley Scarlet, MD   50 mg at 12/25/20 [id number]    Lab Results: No results found for this or any previous visit (from the past 48 hour(s)).  Blood Alcohol level:  Lab Results  Component Value Date   ETH <10 12/16/2020   ETH <10 123456    Metabolic Disorder Labs: Lab Results  Component Value Date   HGBA1C 5.4 10/13/2018   MPG 108.28 10/13/2018   MPG 111.15 08/29/2018   No results found for: PROLACTIN Lab Results  Component Value Date   CHOL 169 12/17/2020   TRIG 149 12/17/2020   HDL 33 (L) 12/17/2020   CHOLHDL 5.1 12/17/2020   VLDL 30 12/17/2020   LDLCALC 106 (H) 12/17/2020   LDLCALC 93 01/03/2020    Physical Findings: AIMS:  , ,  ,  ,    CIWA:     COWS:     Musculoskeletal: Strength & Muscle Tone:  within normal limits Gait & Station: normal Patient leans: N/A  Psychiatric Specialty Exam: Physical Exam Vitals and nursing note reviewed.  Constitutional:      Appearance: She is well-developed and well-nourished.  HENT:     Head: Normocephalic and atraumatic.  Eyes:     Conjunctiva/sclera: Conjunctivae normal.     Pupils: Pupils are equal, round, and reactive to light.  Cardiovascular:     Heart sounds: Normal heart sounds.  Pulmonary:     Effort: Pulmonary effort is normal.  Abdominal:     Palpations: Abdomen is soft.  Musculoskeletal:        General: Normal range of motion.     Cervical back: Normal range of motion.  Skin:    General: Skin is warm and dry.  Neurological:     General: No focal deficit present.     Mental Status: She is alert.  Psychiatric:        Attention and Perception: She is inattentive.        Mood and Affect: Affect is blunt.        Speech: Speech is delayed.        Behavior: Behavior is slowed.        Thought Content: Thought content is delusional. Thought content does not include homicidal or suicidal ideation.     Review of Systems  Constitutional: Negative.   HENT: Negative.   Eyes: Negative.   Respiratory: Negative.   Cardiovascular: Negative.   Gastrointestinal: Negative.   Musculoskeletal: Negative.   Skin: Negative.   Neurological: Negative.   Psychiatric/Behavioral: Positive for dysphoric mood.    Blood pressure 113/79, pulse 75, temperature 98.3 F (36.8 C), temperature source Oral, resp. rate 18, height 4\' 11"  (1.499 m), weight 50.8 kg, SpO2 96 %.Body mass index is 22.62 kg/m.  General Appearance: Casual  Eye Contact:  Fair  Speech:  Slow  Volume:  Decreased  Mood:  Dysphoric  Affect:  Constricted  Thought Process:  Disorganized  Orientation:  Full (Time, Place, and Person)  Thought Content:  Logical  Suicidal Thoughts:  No  Homicidal Thoughts:  No  Memory:   Immediate;   Fair Recent;   Fair Remote;   Fair  Judgement:  Fair  Insight:  Fair  Psychomotor Activity:  Normal  Concentration:  Concentration: Fair  Recall:  [employer] of Knowledge:  Fair  Language:  Fair  Akathisia:  No  Handed:  Right  AIMS (if indicated):     Assets:  Desire for Improvement  ADL's:  Intact  Cognition:  WNL  Sleep:  Number of Hours: 8.75     Treatment Plan Summary: Plan Continue current medicine.  Encourage patient to be up out of bed more.  Vitals are stable although blood pressure is a little low.  Still too disorganized for discharge  Alethia Berthold, MD 12/25/2020, 3:38 PM

## 2020-12-25 NOTE — Progress Notes (Signed)
Recreation Therapy Notes    Patient greeted and given Recreation therapy/Leisure packet by Clinical research associate due to isolation precautions.      Leslie Wong  LRT/CTRS          [name] 12/25/2020 9:43 AM

## 2020-12-25 NOTE — Progress Notes (Signed)
Patient slept most of shift. Denies any SI, HI, AVH. No complaints voiced. No pain voiced. Remains safe on unit with q 15 min checks.

## 2020-12-26 LAB — SARS CORONAVIRUS 2 (TAT 6-24 HRS): SARS Coronavirus 2: NEGATIVE

## 2020-12-26 MED ORDER — ONDANSETRON HCL 4 MG PO TABS
4.0000 mg | ORAL_TABLET | Freq: Once | ORAL | Status: AC
  Administered 2020-12-26: 4 mg via ORAL
  Filled 2020-12-26: qty 1

## 2020-12-26 NOTE — Progress Notes (Signed)
[facility] MD Progress Note  12/26/2020 10:28 AM Leslie Wong  MRN:  [id number]  Principal Problem: Bipolar affective disorder, depressed, severe, with psychotic behavior (HCC) Diagnosis: Principal Problem:   Bipolar affective disorder, depressed, severe, with psychotic behavior (HCC) Active Problems:   Hypothyroidism   Gastroesophageal reflux disease with hiatal hernia   Cracking skin   Delusion (HCC)  Leslie Wong is a 65 y.o. female patient who presents to the [facility] unit for treatment of bipolar disorder exacerbation with suicidal thoghts in the context of somatic complaint (reported burning pain in her skin) that is likely delusional.  Interval History Patient was seen today for re-evaluation.  Nursing reports no events overnight. Patient continues to isolate in her room. Patient has been medication compliant.    Subjective: Patient reports "my skin is burning"; states she had noticed just slight improvement of her pain since admission. She says she took a shower yesterday and is planning to take another one today; she puts moisturizer on her skin after shower and "it makes it a little better".  She continues to report depressed mood, insomnia. She denies suicidal thoughts and does not express passive death wishes. She denies homicidal thoughts, any hallucinations as well. No side effects from current medications reported.   Labs: 12/31 SARS-CoV-2 target nucleic acids are NOT DETECTED.    Total Time spent with patient: 20 minutes  Past Psychiatric History: see H&P   Past Medical History:  Past Medical History:  Diagnosis Date  . Back pain   . Cancer (HCC)    skin  . Depression    BAD  . Gastroparesis   . GERD (gastroesophageal reflux disease)   . Hypothyroidism   . Insomnia   . Migraines   . Parkinson disease (HCC)    per [facility], dx'd 2019  . Recurrent cold sores   . Shoulder bursitis   . Urge incontinence     Past Surgical History:  Procedure Laterality Date   . ABDOMINAL HYSTERECTOMY  1999   total  . APPENDECTOMY    . ESOPHAGOGASTRODUODENOSCOPY  01/2006   negative except small hiatal hernia  . ESOPHAGOGASTRODUODENOSCOPY  02/24/2015   See report  . LAPAROSCOPY     for endometriosis  . OVARIAN CYST REMOVAL  1990   unilateral  . TONSILLECTOMY AND ADENOIDECTOMY     Family History:  Family History  Problem Relation Age of Onset  . Hypertension Mother   . Arthritis Mother   . Diabetes Mother   . Stroke Mother   . Cancer Father        lung  . Colon cancer Neg Hx   . Breast cancer Neg Hx    Family Psychiatric  History: see H&P  Social History:  Social History   Substance and Sexual Activity  Alcohol Use No  . Alcohol/week: 0.0 standard drinks     Social History   Substance and Sexual Activity  Drug Use No    Social History   Socioeconomic History  . Marital status: Single    Spouse name: Not on file  . Number of children: Not on file  . Years of education: Not on file  . Highest education level: Not on file  Occupational History  . Not on file  Tobacco Use  . Smoking status: Current Some Day Smoker    Packs/day: 0.25    Years: 42.50    Pack years: 10.62    Types: Cigarettes  . Smokeless tobacco: Former Neurosurgeon  . Tobacco comment: declined  Vaping Use  . Vaping Use: Never used  Substance and Sexual Activity  . Alcohol use: No    Alcohol/week: 0.0 standard drinks  . Drug use: No  . Sexual activity: Never  Other Topics Concern  . Not on file  Social History Narrative   Lives alone.     Has a dog names Dixie   Social Determinants of Health   Financial Resource Strain: Not on file  Food Insecurity: Not on file  Transportation Needs: Not on file  Physical Activity: Not on file  Stress: Not on file  Social Connections: Not on file   Additional Social History:    History of alcohol / drug use?: No history of alcohol / drug abuse                    Sleep: Poor  Appetite:  Poor  Current  Medications: Current Facility-Administered Medications  Medication Dose Route Frequency Provider Last Rate Last Admin  . acetaminophen (TYLENOL) tablet 650 mg  650 mg Oral Q6H PRN [name], [name], MD   650 mg at 12/17/20 0755  . albuterol (VENTOLIN HFA) 108 (90 Base) MCG/ACT inhaler 2 puff  2 puff Inhalation Q6H PRN [name], [name], MD      . alum & mag hydroxide-simeth (MAALOX/MYLANTA) 200-200-20 MG/5ML suspension 30 mL  30 mL Oral Q4H PRN [name], [name], MD      . diphenhydrAMINE (BENADRYL) capsule 50 mg  50 mg Oral Q6H PRN Salley Scarlet, MD   50 mg at 12/24/20 0131   Or  . diphenhydrAMINE (BENADRYL) injection 50 mg  50 mg Intramuscular Q6H PRN Salley Scarlet, MD      . feeding supplement (ENSURE ENLIVE / ENSURE PLUS) liquid 237 mL  237 mL Oral BID BM Salley Scarlet, MD   237 mL at 12/23/20 1400  . hydrOXYzine (ATARAX/VISTARIL) tablet 50 mg  50 mg Oral TID PRN Salley Scarlet, MD   50 mg at 12/24/20 0131  . levothyroxine (SYNTHROID) tablet 88 mcg  88 mcg Oral Q0600 [name], [name], MD   88 mcg at 12/26/20 0631  . magnesium hydroxide (MILK OF MAGNESIA) suspension 30 mL  30 mL Oral Daily PRN [name], [name], MD      . mineral oil-hydrophilic petrolatum (AQUAPHOR) ointment   Topical Daily PRN [name], MD   1 application at [id number]  . mirtazapine (REMERON) tablet 15 mg  15 mg Oral QHS Salley Scarlet, MD   15 mg at 12/25/20 2149  . multivitamin with minerals tablet 1 tablet  1 tablet Oral Daily Salley Scarlet, MD   1 tablet at 12/26/20 [phone]  . OLANZapine (ZYPREXA) tablet 15 mg  15 mg Oral QHS Salley Scarlet, MD   15 mg at 12/25/20 2149  . oxyCODONE (Oxy IR/ROXICODONE) immediate release tablet 10 mg  10 mg Oral QID PRN Salley Scarlet, MD      . pantoprazole (PROTONIX) EC tablet 40 mg  40 mg Oral Daily [name], [name], MD   40 mg at 12/26/20 [id number]  . pregabalin (LYRICA) capsule 50 mg  50 mg Oral BID Salley Scarlet, MD   50 mg at 12/26/20 0920  . valACYclovir (VALTREX)  tablet 500 mg  500 mg Oral Daily Salley Scarlet, MD   500 mg at 12/26/20 [id number]    Lab Results:  Results for orders placed or performed during the hospital encounter of 12/16/20 (from the past 48  hour(s))  SARS CORONAVIRUS 2 (TAT 6-24 HRS) Nasopharyngeal Nasopharyngeal Swab     Status: None   Collection Time: 12/25/20 12:38 PM   Specimen: Nasopharyngeal Swab  Result Value Ref Range   SARS Coronavirus 2 NEGATIVE NEGATIVE    Comment: (NOTE) SARS-CoV-2 target nucleic acids are NOT DETECTED.  The SARS-CoV-2 RNA is generally detectable in upper and lower respiratory specimens during the acute phase of infection. Negative results do not preclude SARS-CoV-2 infection, do not rule out co-infections with other pathogens, and should not be used as the sole basis for treatment or other patient management decisions. Negative results must be combined with clinical observations, patient history, and epidemiological information. The expected result is Negative.  Fact Sheet for Patients: SugarRoll.be  Fact Sheet for Healthcare Providers: https://www.woods-mathews.com/  This test is not yet approved or cleared by the [place] FDA and  has been authorized for detection and/or diagnosis of SARS-CoV-2 by FDA under an Emergency Use Authorization (EUA). This EUA will remain  in effect (meaning this test can be used) for the duration of the COVID-19 declaration under Se ction 564(b)(1) of the Act, 21 U.S.C. section 360bbb-3(b)(1), unless the authorization is terminated or revoked sooner.  Performed at [facility], [place] [address] [place], [place] [address]     Blood Alcohol level:  Lab Results  Component Value Date   [facility] <10 12/16/2020   ETH <10 123456    Metabolic Disorder Labs: Lab Results  Component Value Date   HGBA1C 5.4 10/13/2018   MPG 108.28 10/13/2018   MPG 111.15 08/29/2018   No results found for: PROLACTIN Lab Results   Component Value Date   CHOL 169 12/17/2020   TRIG 149 12/17/2020   HDL 33 (L) 12/17/2020   CHOLHDL 5.1 12/17/2020   VLDL 30 12/17/2020   LDLCALC 106 (H) 12/17/2020   LDLCALC 93 01/03/2020    Physical Findings: AIMS:  , ,  ,  ,    CIWA:    COWS:     Musculoskeletal: Strength & Muscle Tone: within normal limits Gait & Station: normal Patient leans: N/A  Psychiatric Specialty Exam: Physical Exam   Review of Systems   Blood pressure 108/66, pulse 79, temperature 98.5 F (36.9 C), temperature source Oral, resp. rate 14, height 4\' 11"  (1.499 m), weight 50.8 kg, SpO2 96 %.Body mass index is 22.62 kg/m.  General Appearance: Casual  Eye Contact:  Good  Speech:  Normal Rate  Volume:  Decreased  Mood:  Anxious, Depressed, Dysphoric and Hopeless  Affect:  Congruent and Constricted  Thought Process:  Coherent  Orientation:  Full (Time, Place, and Person)  Thought Content:  Delusions and Rumination  Suicidal Thoughts:  no active suicidakl thoughts or plans; expresses passive death wishes  Homicidal Thoughts:  No  Memory:  Immediate;   Fair Recent;   Fair  Judgement:  Other:  limited  Insight:  Shallow  Psychomotor Activity:  Decreased  Concentration:  Concentration: Poor  Recall:  [employer] of Knowledge:  Fair  Language:  Fair  Akathisia:  No  Handed:  Right  AIMS (if indicated):     Assets:  Desire for Improvement Financial Resources/Insurance Housing Social Support  ADL's:  Impaired  Cognition:  Impaired,  Mild  Sleep:  Number of Hours: 8.75     Treatment Plan Summary: Daily contact with patient to assess and evaluate symptoms and progress in treatment and Medication management  Patient is a 65 year old female with the above-stated past psychiatric  history who is seen in follow-up.  Chart reviewed. Patient discussed with nursing. Patient continues to express depressed mood due to her somatic complaints. Tolerating current medications well. Will continue all  medications per current plan.   Plan:  -continue inpatient psych admission; 15-minute checks; daily contact with patient to assess and evaluate symptoms and progress in treatment; psychoeducation.  -continue scheduled medications: . feeding supplement  237 mL Oral BID BM  . levothyroxine  88 mcg Oral Q0600  . mirtazapine  15 mg Oral QHS  . multivitamin with minerals  1 tablet Oral Daily  . OLANZapine  15 mg Oral QHS  . pantoprazole  40 mg Oral Daily  . pregabalin  50 mg Oral BID  . valACYclovir  500 mg Oral Daily    -continue PRN medications. acetaminophen, albuterol, alum & mag hydroxide-simeth, diphenhydrAMINE **OR** diphenhydrAMINE, hydrOXYzine, magnesium hydroxide, mineral oil-hydrophilic petrolatum, oxyCODONE  -Pertinent Labs: 12/31 SARS-CoV-2 target nucleic acids are NOT DETECTED.     -Consults: No new consults placed since yesterday    -Disposition: Patient is not ready for discharge yet. All necessary aftercare will be arranged prior to discharge Likely d/c home with outpatient psych follow-up.  -  I certify that the patient does need, on a daily basis, active treatment furnished directly by or requiring the supervision of inpatient psychiatric facility personnel.   [name], MD 12/26/2020, 10:28 AM

## 2020-12-26 NOTE — Progress Notes (Signed)
Pt is alert and oriented to person, place, time and situation. Pt is calm, cooperative, denies suicidal and homicidal ideation, denies hallucinations. Pt watches tv in the dayroom, or rests quietly in her bed. Pt has been medication complaint. Will continue to monitor pt per Q15 minute face checks and monitor for safety and progress.

## 2020-12-26 NOTE — Plan of Care (Signed)
  Problem: Education: Goal: Knowledge of [facility] Education information/materials will improve Outcome: Progressing Goal: Emotional status will improve Outcome: Progressing Goal: Mental status will improve Outcome: Progressing Goal: Verbalization of understanding the information provided will improve Outcome: Progressing   Problem: Safety: Goal: Periods of time without injury will increase Outcome: Progressing   Problem: Education: Goal: Ability to state activities that reduce stress will improve Outcome: Progressing   Problem: Coping: Goal: Ability to identify and develop effective coping behavior will improve Outcome: Progressing   Problem: Self-Concept: Goal: Ability to identify factors that promote anxiety will improve Outcome: Progressing Goal: Level of anxiety will decrease Outcome: Progressing Goal: Ability to modify response to factors that promote anxiety will improve Outcome: Progressing   Problem: Education: Goal: Ability to incorporate positive changes in behavior to improve self-esteem will improve Outcome: Progressing   

## 2020-12-26 NOTE — Progress Notes (Signed)
Patient is pleasant and cooperative. She is alert and oriented with clear and logical speech and thought. She denies SI  HI  AVH depression and pain at this encounter.  She does endorse anxiety rating 6/10. She received QHS medication and tolerated meds without incident. She remains isolative to her room at his time due to COVID precaution.  She is monitored every 15 minutes and encouraged to use call bell if assistance is needed.      Cleo Butler-Nicholson, LPN

## 2020-12-27 NOTE — [facility] Group Notes (Addendum)
LCSW Aftercare Discharge Planning Group Note   12/26/2020 12:15 PM  Type of Group and Topic: Psychoeducational Group:  Discharge Planning  Participation Level:  Active  Description of Group  Discharge planning group reviews patient's anticipated discharge plans and assists patients to anticipate and address any barriers to wellness/recovery in the community.  Suicide prevention education is reviewed with patients in group.   Therapeutic Goals 1. Patients will state their anticipated discharge plan and mental health aftercare 2. Patients will identify potential barriers to wellness in the community setting 3. Patients will engage in problem solving, solution focused discussion of ways to anticipate and address barriers to wellness/recovery   Summary of Patient Progress: CSW spoke with patient about her discharge plan. Patient stated she was not sure but her plan is to go back home. Patient stated her daughter lives 10 minutes away from her. Patient stated at this time she has no goals for treatment.     Therapeutic Modalities: Motivational Interviewing    Susa Simmonds, LCSWA 12/27/2020 12:02 PM

## 2020-12-27 NOTE — Progress Notes (Signed)
Pt is alert and oriented to person, place, time and situation. Pt is calm, cooperative, denies suicidal and homicidal ideation, denies hallucinations. Pt has a flat affect, depressed mood, reports anxiety. Pt has been resting in her room, c/o pain, was given pain medication PRN and PRN Maalox for acid reflux complaints; both with good effect. Pt's thoughts are logical, but pt is slow to respond, thought blocking noted. Will continue to monitor pt per Q15 minute face checks and monitor for safety and progress.

## 2020-12-27 NOTE — Progress Notes (Signed)
Pt with somatic complaints. Complains of burning pain throughout body. Reports burning in eyes chest etc. Pt request pain medications. Given with good relief. Pt continues to be medication compliant. Came out of room for snack and meds on last pm. Slept well.  Encouragement and support provided. Safety checks maintained. Medications given as prescribed. Pt receptive and remains safe on unit with q 15 min checks.

## 2020-12-27 NOTE — Progress Notes (Signed)
[facility] MD Progress Note  12/27/2020 10:59 AM Leslie Wong  MRN:  [id number]  Principal Problem: Bipolar affective disorder, depressed, severe, with psychotic behavior ([place]) Diagnosis: Principal Problem:   Bipolar affective disorder, depressed, severe, with psychotic behavior ([place]) Active Problems:   Hypothyroidism   Gastroesophageal reflux disease with hiatal hernia   Cracking skin   Delusion ([place])  Leslie Wong is a 65 y.o. female patient who presents to the [facility] unit for treatment of bipolar disorder exacerbation with suicidal thoghts in the context of somatic complaint (reported burning pain in her skin) that is likely delusional.  Interval History Patient was seen today for re-evaluation.  Nursing reports no events overnight. Patient continues to isolate in her room. Patient has been medication compliant.    Subjective: Patient reports "I am still in pain. My skin is burning"; states she had noticed only slight improvement of her pain since admission maybe due to Lyrica, but mostly she gets relief with Oxycodone She says she took a shower again yesterday, she puts moisturizer on her skin after shower and "it makes it a little better".  She continues to report depressed mood, insomnia in settings of her constant pain. She denies suicidal thoughts and does not express passive death wishes. She denies homicidal thoughts, any hallucinations as well. No side effects from current medications reported.   Labs: 12/31 SARS-CoV-2 target nucleic acids are NOT DETECTED.    Total Time spent with patient: 20 minutes  Past Psychiatric History: see H&P   Past Medical History:  Past Medical History:  Diagnosis Date  . Back pain   . Cancer ([place])    skin  . Depression    BAD  . Gastroparesis   . GERD (gastroesophageal reflux disease)   . Hypothyroidism   . Insomnia   . Migraines   . Parkinson disease ([place])    per [facility] [facility], dx'd 2019  . Recurrent cold sores   . Shoulder bursitis    . Urge incontinence     Past Surgical History:  Procedure Laterality Date  . ABDOMINAL HYSTERECTOMY  1999   total  . APPENDECTOMY    . ESOPHAGOGASTRODUODENOSCOPY  01/2006   negative except small hiatal hernia  . ESOPHAGOGASTRODUODENOSCOPY  02/24/2015   See report  . LAPAROSCOPY     for endometriosis  . OVARIAN CYST REMOVAL  1990   unilateral  . TONSILLECTOMY AND ADENOIDECTOMY     Family History:  Family History  Problem Relation Age of Onset  . Hypertension Mother   . Arthritis Mother   . Diabetes Mother   . Stroke Mother   . Cancer Father        lung  . Colon cancer Neg Hx   . Breast cancer Neg Hx    Family Psychiatric  History: see H&P  Social History:  Social History   Substance and Sexual Activity  Alcohol Use No  . Alcohol/week: 0.0 standard drinks     Social History   Substance and Sexual Activity  Drug Use No    Social History   Socioeconomic History  . Marital status: Single    Spouse name: Not on file  . Number of children: Not on file  . Years of education: Not on file  . Highest education level: Not on file  Occupational History  . Not on file  Tobacco Use  . Smoking status: Current Some Day Smoker    Packs/day: 0.25    Years: 42.50    Pack years: 10.62  Types: Cigarettes  . Smokeless tobacco: Former Neurosurgeon  . Tobacco comment: declined  Vaping Use  . Vaping Use: Never used  Substance and Sexual Activity  . Alcohol use: No    Alcohol/week: 0.0 standard drinks  . Drug use: No  . Sexual activity: Never  Other Topics Concern  . Not on file  Social History Narrative   Lives alone.     Has a dog names Dixie   Social Determinants of Health   Financial Resource Strain: Not on file  Food Insecurity: Not on file  Transportation Needs: Not on file  Physical Activity: Not on file  Stress: Not on file  Social Connections: Not on file   Additional Social History:    History of alcohol / drug use?: No history of alcohol / drug abuse                     Sleep: Poor  Appetite:  Poor  Current Medications: Current Facility-Administered Medications  Medication Dose Route Frequency Provider Last Rate Last Admin  . acetaminophen (TYLENOL) tablet 650 mg  650 mg Oral Q6H PRN [name], Jackquline Denmark, MD   650 mg at 12/17/20 0755  . albuterol (VENTOLIN HFA) 108 (90 Base) MCG/ACT inhaler 2 puff  2 puff Inhalation Q6H PRN [name], [name], MD      . alum & mag hydroxide-simeth (MAALOX/MYLANTA) 200-200-20 MG/5ML suspension 30 mL  30 mL Oral Q4H PRN [name], [name], MD   30 mL at 12/26/20 1655  . diphenhydrAMINE (BENADRYL) capsule 50 mg  50 mg Oral Q6H PRN Jesse Sans, MD   50 mg at 12/24/20 0131   Or  . diphenhydrAMINE (BENADRYL) injection 50 mg  50 mg Intramuscular Q6H PRN Jesse Sans, MD      . feeding supplement (ENSURE ENLIVE / ENSURE PLUS) liquid 237 mL  237 mL Oral BID BM Jesse Sans, MD   237 mL at 12/26/20 1112  . hydrOXYzine (ATARAX/VISTARIL) tablet 50 mg  50 mg Oral TID PRN Jesse Sans, MD   50 mg at 12/24/20 0131  . levothyroxine (SYNTHROID) tablet 88 mcg  88 mcg Oral Q0600 [name], Jackquline Denmark, MD   88 mcg at 12/27/20 [phone]  . magnesium hydroxide (MILK OF MAGNESIA) suspension 30 mL  30 mL Oral Daily PRN [name], [name], MD      . mineral oil-hydrophilic petrolatum (AQUAPHOR) ointment   Topical Daily PRN Thalia Party, MD   1 application at 12/19/20 1635  . mirtazapine (REMERON) tablet 15 mg  15 mg Oral QHS Jesse Sans, MD   15 mg at 12/26/20 2104  . multivitamin with minerals tablet 1 tablet  1 tablet Oral Daily Jesse Sans, MD   1 tablet at 12/27/20 [phone]  . OLANZapine (ZYPREXA) tablet 15 mg  15 mg Oral QHS Jesse Sans, MD   15 mg at 12/26/20 2104  . oxyCODONE (Oxy IR/ROXICODONE) immediate release tablet 10 mg  10 mg Oral QID PRN Jesse Sans, MD   10 mg at 12/27/20 2725  . pantoprazole (PROTONIX) EC tablet 40 mg  40 mg Oral Daily [name], Jackquline Denmark, MD   40 mg at 12/27/20 3664  . pregabalin  (LYRICA) capsule 50 mg  50 mg Oral BID Jesse Sans, MD   50 mg at 12/27/20 4034  . valACYclovir (VALTREX) tablet 500 mg  500 mg Oral Daily Jesse Sans, MD   500 mg at 12/27/20 [phone]  Lab Results:  Results for orders placed or performed during the hospital encounter of 12/16/20 (from the past 48 hour(s))  SARS CORONAVIRUS 2 (TAT 6-24 HRS) Nasopharyngeal Nasopharyngeal Swab     Status: None   Collection Time: 12/25/20 12:38 PM   Specimen: Nasopharyngeal Swab  Result Value Ref Range   SARS Coronavirus 2 NEGATIVE NEGATIVE    Comment: (NOTE) SARS-CoV-2 target nucleic acids are NOT DETECTED.  The SARS-CoV-2 RNA is generally detectable in upper and lower respiratory specimens during the acute phase of infection. Negative results do not preclude SARS-CoV-2 infection, do not rule out co-infections with other pathogens, and should not be used as the sole basis for treatment or other patient management decisions. Negative results must be combined with clinical observations, patient history, and epidemiological information. The expected result is Negative.  Fact Sheet for Patients: SugarRoll.be  Fact Sheet for Healthcare Providers: https://www.woods-mathews.com/  This test is not yet approved or cleared by the [place] FDA and  has been authorized for detection and/or diagnosis of SARS-CoV-2 by FDA under an Emergency Use Authorization (EUA). This EUA will remain  in effect (meaning this test can be used) for the duration of the COVID-19 declaration under Se ction 564(b)(1) of the Act, 21 U.S.C. section 360bbb-3(b)(1), unless the authorization is terminated or revoked sooner.  Performed at [facility], [place] [address] [place], [place] [address]     Blood Alcohol level:  Lab Results  Component Value Date   [facility] <10 12/16/2020   ETH <10 [id number]    Metabolic Disorder Labs: Lab Results  Component Value Date   HGBA1C  5.4 10/13/2018   MPG 108.28 10/13/2018   MPG 111.15 08/29/2018   No results found for: PROLACTIN Lab Results  Component Value Date   CHOL 169 12/17/2020   TRIG 149 12/17/2020   HDL 33 (L) 12/17/2020   CHOLHDL 5.1 12/17/2020   VLDL 30 12/17/2020   LDLCALC 106 (H) 12/17/2020   LDLCALC 93 01/03/2020    Physical Findings: AIMS:  , ,  ,  ,    CIWA:    COWS:     Musculoskeletal: Strength & Muscle Tone: within normal limits Gait & Station: normal Patient leans: N/A  Psychiatric Specialty Exam: Physical Exam   Review of Systems   Blood pressure 106/73, pulse 74, temperature 98.6 F (37 C), temperature source Oral, resp. rate 18, height 4\' 11"  (1.499 m), weight 50.8 kg, SpO2 96 %.Body mass index is 22.62 kg/m.  General Appearance: Casual  Eye Contact:  Good  Speech:  Normal Rate  Volume:  Decreased  Mood:  Anxious, Depressed, Dysphoric and Hopeless  Affect:  Congruent and Constricted  Thought Process:  Coherent  Orientation:  Full (Time, Place, and Person)  Thought Content:  Delusions and Rumination  Suicidal Thoughts:  no active suicidakl thoughts or plans; expresses passive death wishes  Homicidal Thoughts:  No  Memory:  Immediate;   Fair Recent;   Fair  Judgement:  Other:  limited  Insight:  Shallow  Psychomotor Activity:  Decreased  Concentration:  Concentration: Poor  Recall:  [employer] of Knowledge:  Fair  Language:  Fair  Akathisia:  No  Handed:  Right  AIMS (if indicated):     Assets:  Desire for Improvement Financial Resources/Insurance Housing Social Support  ADL's:  Impaired  Cognition:  Impaired,  Mild  Sleep:  Number of Hours: 7.45     Treatment Plan Summary: Daily contact with patient to assess and evaluate  symptoms and progress in treatment and Medication management  Patient is a 65 year old female with the above-stated past psychiatric history who is seen in follow-up.  Chart reviewed. Patient discussed with nursing. Patient continues to  express depressed mood due to her somatic complaints. Tolerating current medications well. Will continue all medications per current plan.   Plan:  -continue inpatient psych admission; 15-minute checks; daily contact with patient to assess and evaluate symptoms and progress in treatment; psychoeducation.  -continue scheduled medications: . feeding supplement  237 mL Oral BID BM  . levothyroxine  88 mcg Oral Q0600  . mirtazapine  15 mg Oral QHS  . multivitamin with minerals  1 tablet Oral Daily  . OLANZapine  15 mg Oral QHS  . pantoprazole  40 mg Oral Daily  . pregabalin  50 mg Oral BID  . valACYclovir  500 mg Oral Daily    -continue PRN medications. acetaminophen, albuterol, alum & mag hydroxide-simeth, diphenhydrAMINE **OR** diphenhydrAMINE, hydrOXYzine, magnesium hydroxide, mineral oil-hydrophilic petrolatum, oxyCODONE  -Pertinent Labs: 12/31 SARS-CoV-2 target nucleic acids are NOT DETECTED.     -Consults: No new consults placed since yesterday    -Disposition: Patient is not ready for discharge yet. All necessary aftercare will be arranged prior to discharge Likely d/c home with outpatient psych follow-up.  -  I certify that the patient does need, on a daily basis, active treatment furnished directly by or requiring the supervision of inpatient psychiatric facility personnel.   [name], MD 12/27/2020, 10:59 AM

## 2020-12-27 NOTE — [facility] Group Notes (Signed)
[facility] Group Notes: (Clinical Social Work)   12/27/2020      Type of Therapy:  Group Therapy   Participation Level:  Did Not Attend - was invited individually by Nurse/MHT and chose not to attend.   Susa Simmonds, LCSWA 12/27/2020  1:47 PM

## 2020-12-27 NOTE — Plan of Care (Signed)
  Problem: Education: Goal: Emotional status will improve Outcome: Progressing Goal: Mental status will improve Outcome: Progressing   Problem: Safety: Goal: Periods of time without injury will increase Outcome: Progressing   

## 2020-12-28 DIAGNOSIS — F315 Bipolar disorder, current episode depressed, severe, with psychotic features: Secondary | ICD-10-CM | POA: Diagnosis not present

## 2020-12-28 NOTE — Tx Team (Signed)
Interdisciplinary Treatment and Diagnostic Plan Update  12/28/2020 Time of Session: 8:30AM Leslie Wong MRN: [id number]  Principal Diagnosis: Bipolar affective disorder, depressed, severe, with psychotic behavior (H[facility])  Secondary Diagnoses: Principal Problem:   Bipolar affective disorder, depressed, severe, with psychotic behavior (H[facility]) Active Problems:   Hypothyroidism   Gastroesophageal reflux disease with hiatal hernia   Cracking skin   Delusion ([facility])   Current Medications:  Current Facility-Administered Medications  Medication Dose Route Frequency Provider Last Rate Last Admin  . acetaminophen (TYLENOL) tablet 650 mg  650 mg Oral Q6H PRN [name], Jackquline Denmark, MD   650 mg at 12/17/20 0755  . albuterol (VENTOLIN HFA) 108 (90 Base) MCG/ACT inhaler 2 puff  2 puff Inhalation Q6H PRN [name], [name], MD      . alum & mag hydroxide-simeth (MAALOX/MYLANTA) 200-200-20 MG/5ML suspension 30 mL  30 mL Oral Q4H PRN [name], Jackquline Denmark, MD   30 mL at 12/27/20 1735  . diphenhydrAMINE (BENADRYL) capsule 50 mg  50 mg Oral Q6H PRN Jesse Sans, MD   50 mg at 12/24/20 0131   Or  . diphenhydrAMINE (BENADRYL) injection 50 mg  50 mg Intramuscular Q6H PRN Jesse Sans, MD      . feeding supplement (ENSURE ENLIVE / ENSURE PLUS) liquid 237 mL  237 mL Oral BID BM Jesse Sans, MD   237 mL at 12/26/20 1112  . hydrOXYzine (ATARAX/VISTARIL) tablet 50 mg  50 mg Oral TID PRN Jesse Sans, MD   50 mg at 12/24/20 0131  . levothyroxine (SYNTHROID) tablet 88 mcg  88 mcg Oral Q0600 [name], Jackquline Denmark, MD   88 mcg at 12/28/20 [phone]  . magnesium hydroxide (MILK OF MAGNESIA) suspension 30 mL  30 mL Oral Daily PRN [name], [name], MD      . mineral oil-hydrophilic petrolatum (AQUAPHOR) ointment   Topical Daily PRN Thalia Party, MD   1 application at 12/19/20 1635  . mirtazapine (REMERON) tablet 15 mg  15 mg Oral QHS Jesse Sans, MD   15 mg at 12/27/20 2105  . multivitamin with minerals tablet 1 tablet   1 tablet Oral Daily Jesse Sans, MD   1 tablet at 12/28/20 [phone]  . OLANZapine (ZYPREXA) tablet 15 mg  15 mg Oral QHS Jesse Sans, MD   15 mg at 12/27/20 2105  . oxyCODONE (Oxy IR/ROXICODONE) immediate release tablet 10 mg  10 mg Oral QID PRN Jesse Sans, MD   10 mg at 12/27/20 3734  . pantoprazole (PROTONIX) EC tablet 40 mg  40 mg Oral Daily [name], Jackquline Denmark, MD   40 mg at 12/28/20 2876  . pregabalin (LYRICA) capsule 50 mg  50 mg Oral BID Jesse Sans, MD   50 mg at 12/28/20 8115  . valACYclovir (VALTREX) tablet 500 mg  500 mg Oral Daily Jesse Sans, MD   500 mg at 12/28/20 7262   PTA Medications: Medications Prior to Admission  Medication Sig Dispense Refill Last Dose  . albuterol (VENTOLIN HFA) 108 (90 Base) MCG/ACT inhaler Inhale 2 puffs into the lungs every 6 (six) hours as needed for wheezing or shortness of breath. 18 g 11   . gabapentin (NEURONTIN) 300 MG capsule Take 1 capsule (300 mg total) by mouth 3 (three) times daily as needed (Skin pain). 90 capsule 0   . levothyroxine (SYNTHROID) 88 MCG tablet Take 1 tablet (88 mcg total) by mouth daily at 6 (six) AM. 90 tablet 1   .  olanzapine zydis (ZYPREXA ZYDIS) 15 MG disintegrating tablet Take 1 tablet (15 mg total) by mouth at bedtime. 90 tablet 0   . omeprazole (PRILOSEC) 20 MG capsule Take 1 capsule (20 mg total) by mouth 2 (two) times daily before a meal. 180 capsule 1   . traZODone (DESYREL) 100 MG tablet Take 1 tablet (100 mg total) by mouth at bedtime. 90 tablet 0   . valACYclovir (VALTREX) 500 MG tablet TAKE 1 TABLET(500 MG) BY MOUTH TWICE DAILY IF NEEDED FOR FEVER BLISTER       Patient Stressors: Other: Pain, burning sensation over entire body  Patient Strengths: Capable of independent living General fund of knowledge Supportive family/friends  Treatment Modalities: Medication Management, Group therapy, Case management,  1 to 1 session with clinician, Psychoeducation, Recreational therapy.   Physician  Treatment Plan for Primary Diagnosis: Bipolar affective disorder, depressed, severe, with psychotic behavior ([place]) Long Term Goal(s): Improvement in symptoms so as ready for discharge Improvement in symptoms so as ready for discharge   Short Term Goals: Ability to identify changes in lifestyle to reduce recurrence of condition will improve Ability to verbalize feelings will improve Ability to disclose and discuss suicidal ideas Ability to demonstrate self-control will improve Ability to identify and develop effective coping behaviors will improve Ability to identify changes in lifestyle to reduce recurrence of condition will improve Ability to verbalize feelings will improve Ability to disclose and discuss suicidal ideas Ability to demonstrate self-control will improve Ability to identify and develop effective coping behaviors will improve  Medication Management: Evaluate patient's response, side effects, and tolerance of medication regimen.  Therapeutic Interventions: 1 to 1 sessions, Unit Group sessions and Medication administration.  Evaluation of Outcomes: Progressing  Physician Treatment Plan for Secondary Diagnosis: Principal Problem:   Bipolar affective disorder, depressed, severe, with psychotic behavior ([place]) Active Problems:   Hypothyroidism   Gastroesophageal reflux disease with hiatal hernia   Cracking skin   Delusion ([place])  Long Term Goal(s): Improvement in symptoms so as ready for discharge Improvement in symptoms so as ready for discharge   Short Term Goals: Ability to identify changes in lifestyle to reduce recurrence of condition will improve Ability to verbalize feelings will improve Ability to disclose and discuss suicidal ideas Ability to demonstrate self-control will improve Ability to identify and develop effective coping behaviors will improve Ability to identify changes in lifestyle to reduce recurrence of condition will improve Ability to verbalize  feelings will improve Ability to disclose and discuss suicidal ideas Ability to demonstrate self-control will improve Ability to identify and develop effective coping behaviors will improve     Medication Management: Evaluate patient's response, side effects, and tolerance of medication regimen.  Therapeutic Interventions: 1 to 1 sessions, Unit Group sessions and Medication administration.  Evaluation of Outcomes: Progressing   RN Treatment Plan for Primary Diagnosis: Bipolar affective disorder, depressed, severe, with psychotic behavior ([place]) Long Term Goal(s): Knowledge of disease and therapeutic regimen to maintain health will improve  Short Term Goals: Ability to remain free from injury will improve, Ability to verbalize frustration and anger appropriately will improve, Ability to demonstrate self-control, Ability to participate in decision making will improve, Ability to verbalize feelings will improve, Ability to disclose and discuss suicidal ideas, Ability to identify and develop effective coping behaviors will improve and Compliance with prescribed medications will improve  Medication Management: RN will administer medications as ordered by provider, will assess and evaluate patient's response and provide education to patient for prescribed medication. RN will report  any adverse and/or side effects to prescribing provider.  Therapeutic Interventions: 1 on 1 counseling sessions, Psychoeducation, Medication administration, Evaluate responses to treatment, Monitor vital signs and CBGs as ordered, Perform/monitor CIWA, COWS, AIMS and Fall Risk screenings as ordered, Perform wound care treatments as ordered.  Evaluation of Outcomes: Progressing   LCSW Treatment Plan for Primary Diagnosis: Bipolar affective disorder, depressed, severe, with psychotic behavior ([place]) Long Term Goal(s): Safe transition to appropriate next level of care at discharge, Engage patient in therapeutic group  addressing interpersonal concerns.  Short Term Goals: Engage patient in aftercare planning with referrals and resources, Increase social support, Increase ability to appropriately verbalize feelings, Increase emotional regulation, Facilitate acceptance of mental health diagnosis and concerns, Identify triggers associated with mental health/substance abuse issues and Increase skills for wellness and recovery  Therapeutic Interventions: Assess for all discharge needs, 1 to 1 time with Social worker, Explore available resources and support systems, Assess for adequacy in community support network, Educate family and significant other(s) on suicide prevention, Complete Psychosocial Assessment, Interpersonal group therapy.  Evaluation of Outcomes: Progressing   Progress in Treatment: Attending groups: Yes. Participating in groups: Yes. Taking medication as prescribed: Yes. Toleration medication: Yes. Family/Significant other contact made: No, will contact:  patient declined familial/collateral contact.  SPE completed with the patient.  Patient understands diagnosis: No. Discussing patient identified problems/goals with staff: Yes. Medical problems stabilized or resolved: Yes. Denies suicidal/homicidal ideation: Yes. Issues/concerns per patient self-inventory: No. Other: None.  New problem(s) identified: No, Describe:  none.  New Short Term/Long Term Goal(s): elimination of symptoms of psychosis, medication management for mood stabilization; elimination of SI thoughts; development of comprehensive mental wellness/sobriety plan. 12/23/20 Update: No changes at this time. Update 12/28/2020:  No changes at this time.  Patient Goals: "I don't know right now." Patient expressed desire to not feel so sad and to work on some of the skin burning with the provider on the day prior. 12/23/20 Update: No changes at this time.  Update 12/28/2020:  No changes at this time.  Discharge Plan or Barriers: Patient  plans to discharge home with continued outpatient treatment through [facility] in [place], [place]. 12/23/20 Update: No changes at this time.  Update 12/28/2020:  Patient denies suicidal ideations at this time.  She reports a desire to return to her home.   Reason for Continuation of Hospitalization: Anxiety Delusions  Medication stabilization Suicidal ideation  Estimated Length of Stay: TBD  Attendees: Patient:  12/28/2020 8:53 AM  Physician: Dr. Weber Cooks, MD 12/28/2020 8:53 AM  Nursing: 12/28/2020 8:53 AM  RN Care Manager: 12/28/2020 8:53 AM  Social Worker: Assunta Curtis, MSW, LCSW 12/28/2020 8:53 AM  Recreational Therapist: 12/28/2020 8:53 AM  Other: Paulla Dolly, MSW, [place], LCASA 12/28/2020 8:53 AM  Other: Gwynneth Munson" [place], LCSW 12/28/2020 8:53 AM  Other: 12/28/2020 8:53 AM    Scribe for Treatment Team: Rozann Lesches, LCSW 12/28/2020 8:53 AM

## 2020-12-28 NOTE — [facility] Group Notes (Signed)
LCSW Group Therapy Note   12/28/2020 1:30 PM  Type of Therapy and Topic:  Group Therapy:  Overcoming Obstacles   Participation Level:  Did Not Attend   Description of Group:    In this group patients will be encouraged to explore what they see as obstacles to their own wellness and recovery. They will be guided to discuss their thoughts, feelings, and behaviors related to these obstacles. The group will process together ways to cope with barriers, with attention given to specific choices patients can make. Each patient will be challenged to identify changes they are motivated to make in order to overcome their obstacles. This group will be process-oriented, with patients participating in exploration of their own experiences as well as giving and receiving support and challenge from other group members.   Therapeutic Goals: 1. Patient will identify personal and current obstacles as they relate to admission. 2. Patient will identify barriers that currently interfere with their wellness or overcoming obstacles.  3. Patient will identify feelings, thought process and behaviors related to these barriers. 4. Patient will identify two changes they are willing to make to overcome these obstacles:      Summary of Patient Progress Patient did not attend group despite encouraged participation.      Therapeutic Modalities:   Cognitive Behavioral Therapy Solution Focused Therapy Motivational Interviewing Relapse Prevention Therapy  Gwenevere Ghazi, MSW, [place], [place] 12/28/2020 1:30 PM

## 2020-12-28 NOTE — Progress Notes (Signed)
Patient has been isolative to room most of shift. Did not come out for snack. Snack and hs medications delivered to her room. Denies SI, HI and AVH

## 2020-12-28 NOTE — Plan of Care (Signed)
  Problem: Education: Goal: Knowledge of [place] [facility] Education information/materials will improve Outcome: Progressing Goal: Emotional status will improve Outcome: Progressing Goal: Mental status will improve Outcome: Progressing Goal: Verbalization of understanding the information provided will improve Outcome: Progressing   Problem: Safety: Goal: Periods of time without injury will increase Outcome: Progressing   Problem: Education: Goal: Ability to state activities that reduce stress will improve Outcome: Progressing   Problem: Coping: Goal: Ability to identify and develop effective coping behavior will improve Outcome: Progressing   Problem: Self-Concept: Goal: Ability to identify factors that promote anxiety will improve Outcome: Progressing Goal: Level of anxiety will decrease Outcome: Progressing Goal: Ability to modify response to factors that promote anxiety will improve Outcome: Progressing   Problem: Education: Goal: Ability to incorporate positive changes in behavior to improve self-esteem will improve Outcome: Progressing   

## 2020-12-28 NOTE — Progress Notes (Signed)
[facility] MD Progress Note  12/28/2020 6:37 PM Meghin Fontenot  MRN:  [id number] Subjective: Follow-up for this 65 year old woman with bipolar disorder.  She says she still feels like her skin is burning all over although she does not really act as though she was having that bad a physical sensation.  She stays mostly to herself in her room.  Reports that her mood is better.  Denies hallucinations.  Denies any suicidal thought.  She is requesting discharge Principal Problem: Bipolar affective disorder, depressed, severe, with psychotic behavior ([place]) Diagnosis: Principal Problem:   Bipolar affective disorder, depressed, severe, with psychotic behavior (Home) Active Problems:   Hypothyroidism   Gastroesophageal reflux disease with hiatal hernia   Cracking skin   Delusion ([place])  Total Time spent with patient: 30 minutes  Past Psychiatric History: Past history of multiple hospitalizations longstanding bipolar disorder with both manic and depressed episodes and frequent psychosis  Past Medical History:  Past Medical History:  Diagnosis Date  . Back pain   . Cancer ([place])    skin  . Depression    BAD  . Gastroparesis   . GERD (gastroesophageal reflux disease)   . Hypothyroidism   . Insomnia   . Migraines   . Parkinson disease ([place])    per [place] [facility], dx'd 2019  . Recurrent cold sores   . Shoulder bursitis   . Urge incontinence     Past Surgical History:  Procedure Laterality Date  . ABDOMINAL HYSTERECTOMY  1999   total  . APPENDECTOMY    . ESOPHAGOGASTRODUODENOSCOPY  01/2006   negative except small hiatal hernia  . ESOPHAGOGASTRODUODENOSCOPY  02/24/2015   See report  . LAPAROSCOPY     for endometriosis  . OVARIAN CYST REMOVAL  1990   unilateral  . TONSILLECTOMY AND ADENOIDECTOMY     Family History:  Family History  Problem Relation Age of Onset  . Hypertension Mother   . Arthritis Mother   . Diabetes Mother   . Stroke Mother   . Cancer Father        lung  .  Colon cancer Neg Hx   . Breast cancer Neg Hx    Family Psychiatric  History: See previous Social History:  Social History   Substance and Sexual Activity  Alcohol Use No  . Alcohol/week: 0.0 standard drinks     Social History   Substance and Sexual Activity  Drug Use No    Social History   Socioeconomic History  . Marital status: Single    Spouse name: Not on file  . Number of children: Not on file  . Years of education: Not on file  . Highest education level: Not on file  Occupational History  . Not on file  Tobacco Use  . Smoking status: Current Some Day Smoker    Packs/day: 0.25    Years: 42.50    Pack years: 10.62    Types: Cigarettes  . Smokeless tobacco: Former Systems developer  . Tobacco comment: declined  Vaping Use  . Vaping Use: Never used  Substance and Sexual Activity  . Alcohol use: No    Alcohol/week: 0.0 standard drinks  . Drug use: No  . Sexual activity: Never  Other Topics Concern  . Not on file  Social History Narrative   Lives alone.     Has a dog names Dixie   Social Determinants of Health   Financial Resource Strain: Not on file  Food Insecurity: Not on file  Transportation Needs: Not on  file  Physical Activity: Not on file  Stress: Not on file  Social Connections: Not on file   Additional Social History:    History of alcohol / drug use?: No history of alcohol / drug abuse                    Sleep: Fair  Appetite:  Fair  Current Medications: Current Facility-Administered Medications  Medication Dose Route Frequency Provider Last Rate Last Admin  . acetaminophen (TYLENOL) tablet 650 mg  650 mg Oral Q6H PRN Consuella Scurlock, Jackquline Denmark, MD   650 mg at 12/17/20 0755  . albuterol (VENTOLIN HFA) 108 (90 Base) MCG/ACT inhaler 2 puff  2 puff Inhalation Q6H PRN Terran Klinke T, MD      . alum & mag hydroxide-simeth (MAALOX/MYLANTA) 200-200-20 MG/5ML suspension 30 mL  30 mL Oral Q4H PRN Caiden Monsivais T, MD   30 mL at 12/28/20 1159  . diphenhydrAMINE  (BENADRYL) capsule 50 mg  50 mg Oral Q6H PRN Jesse Sans, MD   50 mg at 12/24/20 0131   Or  . diphenhydrAMINE (BENADRYL) injection 50 mg  50 mg Intramuscular Q6H PRN Jesse Sans, MD      . feeding supplement (ENSURE ENLIVE / ENSURE PLUS) liquid 237 mL  237 mL Oral BID BM Jesse Sans, MD   237 mL at 12/28/20 0955  . hydrOXYzine (ATARAX/VISTARIL) tablet 50 mg  50 mg Oral TID PRN Jesse Sans, MD   50 mg at 12/24/20 0131  . levothyroxine (SYNTHROID) tablet 88 mcg  88 mcg Oral Q0600 Jasani Lengel, Jackquline Denmark, MD   88 mcg at 12/28/20 [phone]  . magnesium hydroxide (MILK OF MAGNESIA) suspension 30 mL  30 mL Oral Daily PRN Offie Waide T, MD      . mineral oil-hydrophilic petrolatum (AQUAPHOR) ointment   Topical Daily PRN Thalia Party, MD   1 application at 12/19/20 1635  . mirtazapine (REMERON) tablet 15 mg  15 mg Oral QHS Jesse Sans, MD   15 mg at 12/27/20 2105  . multivitamin with minerals tablet 1 tablet  1 tablet Oral Daily Jesse Sans, MD   1 tablet at 12/28/20 [phone]  . OLANZapine (ZYPREXA) tablet 15 mg  15 mg Oral QHS Jesse Sans, MD   15 mg at 12/27/20 2105  . oxyCODONE (Oxy IR/ROXICODONE) immediate release tablet 10 mg  10 mg Oral QID PRN Jesse Sans, MD   10 mg at 12/28/20 1159  . pantoprazole (PROTONIX) EC tablet 40 mg  40 mg Oral Daily Jabria Loos, Jackquline Denmark, MD   40 mg at 12/28/20 7867  . pregabalin (LYRICA) capsule 50 mg  50 mg Oral BID Jesse Sans, MD   50 mg at 12/28/20 1713  . valACYclovir (VALTREX) tablet 500 mg  500 mg Oral Daily Jesse Sans, MD   500 mg at 12/28/20 6720    Lab Results: No results found for this or any previous visit (from the past 48 hour(s)).  Blood Alcohol level:  Lab Results  Component Value Date   ETH <10 12/16/2020   ETH <10 12/08/2020    Metabolic Disorder Labs: Lab Results  Component Value Date   HGBA1C 5.4 10/13/2018   MPG 108.28 10/13/2018   MPG 111.15 08/29/2018   No results found for: PROLACTIN Lab Results   Component Value Date   CHOL 169 12/17/2020   TRIG 149 12/17/2020   HDL 33 (L) 12/17/2020   CHOLHDL 5.1 12/17/2020  VLDL 30 12/17/2020   LDLCALC 106 (H) 12/17/2020   LDLCALC 93 01/03/2020    Physical Findings: AIMS:  , ,  ,  ,    CIWA:    COWS:     Musculoskeletal: Strength & Muscle Tone: within normal limits Gait & Station: normal Patient leans: N/A  Psychiatric Specialty Exam: Physical Exam Vitals and nursing note reviewed.  Constitutional:      Appearance: She is well-developed and well-nourished.  HENT:     Head: Normocephalic and atraumatic.  Eyes:     Conjunctiva/sclera: Conjunctivae normal.     Pupils: Pupils are equal, round, and reactive to light.  Cardiovascular:     Heart sounds: Normal heart sounds.  Pulmonary:     Effort: Pulmonary effort is normal.  Abdominal:     Palpations: Abdomen is soft.  Musculoskeletal:        General: Normal range of motion.     Cervical back: Normal range of motion.  Skin:    General: Skin is warm and dry.  Neurological:     General: No focal deficit present.     Mental Status: She is alert.  Psychiatric:        Mood and Affect: Mood normal.     Review of Systems  Constitutional: Negative.   HENT: Negative.   Eyes: Negative.   Respiratory: Negative.   Cardiovascular: Negative.   Gastrointestinal: Negative.   Musculoskeletal: Negative.   Skin: Negative.   Neurological: Negative.   Psychiatric/Behavioral: Negative.     Blood pressure 116/90, pulse 87, temperature 98.4 F (36.9 C), temperature source Oral, resp. rate 14, height 4\' 11"  (1.499 m), weight 50.8 kg, SpO2 96 %.Body mass index is 22.62 kg/m.  General Appearance: Casual  Eye Contact:  Fair  Speech:  Clear and Coherent  Volume:  Normal  Mood:  Euthymic  Affect:  Constricted  Thought Process:  Coherent  Orientation:  Full (Time, Place, and Person)  Thought Content:  Logical  Suicidal Thoughts:  No  Homicidal Thoughts:  No  Memory:  Immediate;    Fair Recent;   Poor Remote;   Poor  Judgement:  Fair  Insight:  Fair  Psychomotor Activity:  Normal  Concentration:  Concentration: Fair  Recall:  [employer] of Knowledge:  Fair  Language:  Fair  Akathisia:  No  Handed:  Right  AIMS (if indicated):     Assets:  Desire for Improvement  ADL's:  Intact  Cognition:  Impaired,  Mild  Sleep:  Number of Hours: 7.45     Treatment Plan Summary: Medication management and Plan Stable at this point.  No active suicidal thought.  No obvious psychosis.  Patient is requesting discharge.  Spoke about the need for really keeping a serious commitment to outpatient treatment.  No change to medicine likely discharge tomorrow or the next day  Alethia Berthold, MD 12/28/2020, 6:37 PM

## 2020-12-28 NOTE — Progress Notes (Signed)
D: Pt alert and oriented. Pt rates depression 8/10, hopelessness 8/10, and anxiety 8/10. Pt goal: "I don't know." Pt reports energy level as low and concentration as being poor. Pt reports sleep last night as being poor. Pt did receive medications for sleep. Pt reports experiencing 9/10 generalize pain, scheduled medication administered. Pt denies experiencing any SI/HI, or AVH at this time.   Pt appears brighter in affect however ratings and verbal endorsement of anxiety/depression do not reflect the same as visual affect.  A: Scheduled medications administered to pt, per MD orders. Support and encouragement provided. Frequent verbal contact made. Routine safety checks conducted q15 minutes.   R: No adverse drug reactions noted. Pt verbally contracts for safety at this time. Pt complaint with medications. Pt interacts minimally with others on the unit, mostly stay in room isolating and sleeping. Pt remains safe at this time. Will continue to monitor.

## 2020-12-28 NOTE — Progress Notes (Signed)
Recreation Therapy Notes   Date: 12/28/2020  Time: 9:30 am   Location: Craft room     Behavioral response: N/A   Intervention Topic: Self-Care   Discussion/Intervention: Patient did not attend group.   Clinical Observations/Feedback:  Patient did not attend group.   Demerius Podolak LRT/CTRS         Basil Buffin 12/28/2020 11:01 AM

## 2020-12-29 DIAGNOSIS — F315 Bipolar disorder, current episode depressed, severe, with psychotic features: Secondary | ICD-10-CM | POA: Diagnosis not present

## 2020-12-29 MED ORDER — MIRTAZAPINE 15 MG PO TABS
15.0000 mg | ORAL_TABLET | Freq: Every day | ORAL | 1 refills | Status: DC
Start: 2020-12-29 — End: 2021-10-12

## 2020-12-29 MED ORDER — ALBUTEROL SULFATE HFA 108 (90 BASE) MCG/ACT IN AERS: 2.0000 | INHALATION_SPRAY | Freq: Four times a day (QID) | RESPIRATORY_TRACT | 0 refills | Status: DC | PRN

## 2020-12-29 MED ORDER — PANTOPRAZOLE SODIUM 40 MG PO TBEC: 40.0000 mg | DELAYED_RELEASE_TABLET | Freq: Every day | ORAL | 0 refills | Status: DC

## 2020-12-29 MED ORDER — PREGABALIN 50 MG PO CAPS: 50.0000 mg | ORAL_CAPSULE | Freq: Two times a day (BID) | ORAL | 1 refills | Status: DC

## 2020-12-29 MED ORDER — AQUAPHOR EX OINT: TOPICAL_OINTMENT | Freq: Every day | CUTANEOUS | 1 refills | Status: DC | PRN

## 2020-12-29 MED ORDER — VALACYCLOVIR HCL 500 MG PO TABS
500.0000 mg | ORAL_TABLET | Freq: Every day | ORAL | 0 refills | Status: DC
Start: 2020-12-30 — End: 2021-09-15

## 2020-12-29 MED ORDER — HYDROXYZINE HCL 50 MG PO TABS: 50.0000 mg | ORAL_TABLET | Freq: Three times a day (TID) | ORAL | 0 refills | Status: AC | PRN

## 2020-12-29 MED ORDER — OLANZAPINE 15 MG PO TABS: 15.0000 mg | ORAL_TABLET | Freq: Every day | ORAL | 1 refills | Status: DC

## 2020-12-29 MED ORDER — LEVOTHYROXINE SODIUM 88 MCG PO TABS: 88.0000 ug | ORAL_TABLET | Freq: Every day | ORAL | 0 refills | Status: DC

## 2020-12-29 NOTE — BHH Group Notes (Signed)
LCSW Group Therapy Note  12/29/2020 2:09 PM  Type of Therapy/Topic:  Group Therapy:  Feelings about Diagnosis  Participation Level:  Did Not Attend   Description of Group:   This group will allow patients to explore their thoughts and feelings about diagnoses they have received. Patients will be guided to explore their level of understanding and acceptance of these diagnoses. Facilitator will encourage patients to process their thoughts and feelings about the reactions of others to their diagnosis and will guide patients in identifying ways to discuss their diagnosis with significant others in their lives. This group will be process-oriented, with patients participating in exploration of their own experiences, giving and receiving support, and processing challenge from other group members.   Therapeutic Goals: 1. Patient will demonstrate understanding of diagnosis as evidenced by identifying two or more symptoms of the disorder 2. Patient will be able to express two feelings regarding the diagnosis 3. Patient will demonstrate their ability to communicate their needs through discussion and/or role play  Summary of Patient Progress: X  Therapeutic Modalities:   Cognitive Behavioral Therapy Brief Therapy Feelings Identification   Shiquita Collignon R. Algis Greenhouse, MSW, LCSW, LCAS 12/29/2020 2:09 PM

## 2020-12-29 NOTE — Plan of Care (Signed)
Cooperative with treatment, patient spent most of shift resting in bed, she is depressed and sad, but she spent most of shift in the bed resting quietly.

## 2020-12-29 NOTE — Progress Notes (Signed)
D: Pt alert and oriented. Pt rates depression 8/10, hopelessness 8/10, and anxiety 8/10. Pt reports energy level as low and concentration as being poor. Pt reports sleep last night as being fair. Pt did receive medications for sleep and did find them helpful. Pt reports experiencing 9/10 generalized pain, prn meds given. Pt denies experiencing any SI/HI, or AVH at this time.   A: Scheduled medications administered to pt, per MD orders. Support and encouragement provided. Frequent verbal contact made. Routine safety checks conducted q15 minutes.   R: No adverse drug reactions noted. Pt verbally contracts for safety at this time. Pt complaint with medications. Pt interacts minimally with others on the unit. Pt remains safe at this time. Will continue to monitor.

## 2020-12-29 NOTE — BHH Suicide Risk Assessment (Signed)
[facility] Discharge Suicide Risk Assessment   Principal Problem: Bipolar affective disorder, depressed, severe, with psychotic behavior ([facility]) Discharge Diagnoses: Principal Problem:   Bipolar affective disorder, depressed, severe, with psychotic behavior ([facility]) Active Problems:   Hypothyroidism   Gastroesophageal reflux disease with hiatal hernia   Cracking skin   Delusion ([facility])   Total Time spent with patient: 30 minutes  Musculoskeletal: Strength & Muscle Tone: within normal limits Gait & Station: normal Patient leans: N/A  Psychiatric Specialty Exam: Review of Systems  Constitutional: Negative.   HENT: Negative.   Eyes: Negative.   Respiratory: Negative.   Cardiovascular: Negative.   Gastrointestinal: Negative.   Musculoskeletal: Negative.   Skin: Negative.        Patient continues to complain that she feels a burning sensation on her skin "all over"  Neurological: Negative.   Psychiatric/Behavioral: Negative.     Blood pressure 123/90, pulse 86, temperature 98.5 F (36.9 C), temperature source Oral, resp. rate 18, height 4\' 11"  (1.499 m), weight 50.8 kg, SpO2 96 %.Body mass index is 22.62 kg/m.  General Appearance: Casual  Eye Contact::  Fair  Speech:  Clear and Coherent409  Volume:  Normal  Mood:  Euthymic  Affect:  Congruent  Thought Process:  Coherent  Orientation:  Full (Time, Place, and Person)  Thought Content:  Logical  Suicidal Thoughts:  No  Homicidal Thoughts:  No  Memory:  Immediate;   Fair Recent;   Fair Remote;   Fair  Judgement:  Fair  Insight:  Fair  Psychomotor Activity:  Normal  Concentration:  Fair  Recall:  [phone] of Knowledge:Fair  Language: Fair  Akathisia:  No  Handed:  Right  AIMS (if indicated):     Assets:  Communication Skills Desire for Improvement Housing Resilience Social Support  Sleep:  Number of Hours: 8  Cognition: WNL  ADL's:  Intact   Mental Status Per Nursing Assessment::   On Admission:  Self-harm  thoughts  Demographic Factors:  Caucasian and Living alone  Loss Factors: Decline in physical health  Historical Factors: NA  Risk Reduction Factors:   Sense of responsibility to family, Positive social support and Positive therapeutic relationship  Continued Clinical Symptoms:  Bipolar Disorder:   Mixed State  Cognitive Features That Contribute To Risk:  None    Suicide Risk:  Minimal: No identifiable suicidal ideation.  Patients presenting with no risk factors but with morbid ruminations; may be classified as minimal risk based on the severity of the depressive symptoms   Follow-up Information    [facility], [phone] Behavioral Follow up on 12/31/2020.   Why: Appointment is scheduled for 12/31/2020 at 3:40PM.  [employer] is face to face.  Thanks!  Contact information: [address] [place] [phone]               Plan Of Care/Follow-up recommendations:  Activity:  Activity as tolerated Diet:  Regular diet Other:  Follow-up with outpatient treatment through [facility] continuing current medication  [phone], MD 12/29/2020, 10:39 AM

## 2020-12-29 NOTE — Progress Notes (Signed)
  [facility] Adult Case Management Discharge Plan :  Will you be returning to the same living situation after discharge:  Yes,  pt reports that she is returning home. At discharge, do you have transportation home?: Yes,  pt reports that her daughter would provide transportation. Do you have the ability to pay for your medications: Yes,  [facility] Medicare  Release of information consent forms completed and in the chart;  Patient's signature needed at discharge.  Patient to Follow up at:  Follow-up Information    [facility] up on 12/31/2020.   Why: Appointment is scheduled for 12/31/2020 at 3:40PM.  [employer] is face to face.  Thanks!  Contact information: [address] [place] [address] [phone]               Next level of care provider has access to [facility] Link:no  Safety Planning and Suicide Prevention discussed: Yes,  Pt declined SPE with collateral.  SPE completed with the patient.  Have you used any form of tobacco in the last 30 days? (Cigarettes, Smokeless Tobacco, Cigars, and/or Pipes): Yes  Has patient been referred to the Quitline?: Patient refused referral  Patient has been referred for addiction treatment: Pt. refused referral  Harden Mo, LCSW 12/29/2020, 10:19 AM

## 2020-12-29 NOTE — Progress Notes (Signed)
Recreation Therapy Notes  INPATIENT RECREATION TR PLAN  Patient Details Name: Leslie Wong MRN: [id number] DOB: 1956-12-26 Today's Date: 12/29/2020  Rec Therapy Plan Is patient appropriate for Therapeutic Recreation?: Yes Treatment times per week: at least 3 Estimated Length of Stay: 5-7 days TR Treatment/Interventions: Group participation (Comment)  Discharge Criteria Pt will be discharged from therapy if:: Discharged Treatment plan/goals/alternatives discussed and agreed upon by:: Patient/family  Discharge Summary Short term goals set: Patient will engage in groups without prompting or encouragement from LRT x3 group sessions within 5 recreation therapy group sessions Short term goals met: Not met Progress toward goals comments: Groups attended Which groups?: Other (Comment) (Problem Solving) Reason goals not met: Patient spent most of her time in her room Therapeutic equipment acquired: N/A Reason patient discharged from therapy: Discharge from hospital Pt/family agrees with progress & goals achieved: Yes Date patient discharged from therapy: 12/29/20   Donabelle Molden 12/29/2020, 12:49 PM

## 2020-12-29 NOTE — Discharge Summary (Signed)
Physician Discharge Summary Note  Patient:  Leslie Wong is an 65 y.o., female MRN:  [id number] DOB:  October 18, 1956 Patient phone:  [phone] (home)  Patient address:   [place] [address],  Total Time spent with patient: 30 minutes  Date of Admission:  12/16/2020 Date of Discharge: 12/29/2020  Reason for Admission: Admitted after presentation to the emergency room with complaints of depression anxiety somatic symptoms and suicidal ideation  Principal Problem: Bipolar affective disorder, depressed, severe, with psychotic behavior ([place]) Discharge Diagnoses: Principal Problem:   Bipolar affective disorder, depressed, severe, with psychotic behavior ([place]) Active Problems:   Hypothyroidism   Gastroesophageal reflux disease with hiatal hernia   Cracking skin   Delusion ([place])   Past Psychiatric History: Patient has a long history of bipolar disorder with both depressive and manic psychotic presentations in the past.  Past Medical History:  Past Medical History:  Diagnosis Date  . Back pain   . Cancer ([place])    skin  . Depression    BAD  . Gastroparesis   . GERD (gastroesophageal reflux disease)   . Hypothyroidism   . Insomnia   . Migraines   . Parkinson disease ([place])    per [facility], dx'd 2019  . Recurrent cold sores   . Shoulder bursitis   . Urge incontinence     Past Surgical History:  Procedure Laterality Date  . ABDOMINAL HYSTERECTOMY  1999   total  . APPENDECTOMY    . ESOPHAGOGASTRODUODENOSCOPY  01/2006   negative except small hiatal hernia  . ESOPHAGOGASTRODUODENOSCOPY  02/24/2015   See report  . LAPAROSCOPY     for endometriosis  . OVARIAN CYST REMOVAL  1990   unilateral  . TONSILLECTOMY AND ADENOIDECTOMY     Family History:  Family History  Problem Relation Age of Onset  . Hypertension Mother   . Arthritis Mother   . Diabetes Mother   . Stroke Mother   . Cancer Father        lung  . Colon cancer Neg Hx    . Breast cancer Neg Hx    Family Psychiatric  History: See previous Social History:  Social History   Substance and Sexual Activity  Alcohol Use No  . Alcohol/week: 0.0 standard drinks     Social History   Substance and Sexual Activity  Drug Use No    Social History   Socioeconomic History  . Marital status: Single    Spouse name: Not on file  . Number of children: Not on file  . Years of education: Not on file  . Highest education level: Not on file  Occupational History  . Not on file  Tobacco Use  . Smoking status: Current Some Day Smoker    Packs/day: 0.25    Years: 42.50    Pack years: 10.62    Types: Cigarettes  . Smokeless tobacco: Former Systems developer  . Tobacco comment: declined  Vaping Use  . Vaping Use: Never used  Substance and Sexual Activity  . Alcohol use: No    Alcohol/week: 0.0 standard drinks  . Drug use: No  . Sexual activity: Never  Other Topics Concern  . Not on file  Social History Narrative   Lives alone.     Has a dog names Dixie   Social Determinants of Health   Financial Resource Strain: Not on file  Food Insecurity: Not on file  Transportation Needs: Not on file  Physical Activity: Not on  file  Stress: Not on file  Social Connections: Not on file    Hospital Course: Patient was maintained on 15-minute checks.  She was treated for bipolar disorder.  Medications were adjusted slightly during her time in the hospital.  Patient was encouraged to attend treatment team and then to attend therapeutic groups and activities daily.  He continued to complain of a sensation of "burning" all over her skin but did not typically act as though she were in significant physical distress nor was there ever any visible lesion on her skin.  By the time of discharge however she was reporting that her mood was better.  She felt less hopeless and completely denied any suicidal ideation.  Medications have been stabilized and she is planned to be discharged to  continued follow-up with her outpatient psychiatrist  Physical Findings: AIMS:  , ,  ,  ,    CIWA:    COWS:     Musculoskeletal: Strength & Muscle Tone: within normal limits Gait & Station: normal Patient leans: N/A  Psychiatric Specialty Exam: Physical Exam Vitals and nursing note reviewed.  Constitutional:      Appearance: She is well-developed and well-nourished.  HENT:     Head: Normocephalic and atraumatic.  Eyes:     Conjunctiva/sclera: Conjunctivae normal.     Pupils: Pupils are equal, round, and reactive to light.  Cardiovascular:     Heart sounds: Normal heart sounds.  Pulmonary:     Effort: Pulmonary effort is normal.  Abdominal:     Palpations: Abdomen is soft.  Musculoskeletal:        General: Normal range of motion.     Cervical back: Normal range of motion.  Skin:    General: Skin is warm and dry.  Neurological:     General: No focal deficit present.     Mental Status: She is alert.  Psychiatric:        Mood and Affect: Mood normal.     Review of Systems  Constitutional: Negative.   HENT: Negative.   Eyes: Negative.   Respiratory: Negative.   Cardiovascular: Negative.   Gastrointestinal: Negative.   Musculoskeletal: Negative.   Skin: Negative.        Continues to complain of a burning feeling all over her skin with no obvious lesion visible  Neurological: Negative.   Psychiatric/Behavioral: Negative.     Blood pressure 123/90, pulse 86, temperature 98.5 F (36.9 C), temperature source Oral, resp. rate 18, height 4\' 11"  (1.499 m), weight 50.8 kg, SpO2 96 %.Body mass index is 22.62 kg/m.  General Appearance: Casual  Eye Contact:  Fair  Speech:  Clear and Coherent  Volume:  Decreased  Mood:  Euthymic  Affect:  Constricted  Thought Process:  Coherent  Orientation:  Full (Time, Place, and Person)  Thought Content:  Logical  Suicidal Thoughts:  No  Homicidal Thoughts:  No  Memory:  Immediate;   Fair Recent;   Fair Remote;   Fair   Judgement:  Fair  Insight:  Fair  Psychomotor Activity:  Decreased  Concentration:  Concentration: Fair  Recall:  [employer] of Knowledge:  Fair  Language:  Fair  Akathisia:  No  Handed:  Right  AIMS (if indicated):     Assets:  Desire for Improvement  ADL's:  Intact  Cognition:  WNL  Sleep:  Number of Hours: 8     Have you used any form of tobacco in the last 30 days? (Cigarettes, Smokeless Tobacco, Cigars, and/or  Pipes): Yes  Has this patient used any form of tobacco in the last 30 days? (Cigarettes, Smokeless Tobacco, Cigars, and/or Pipes) Yes, No  Blood Alcohol level:  Lab Results  Component Value Date   ETH <10 12/16/2020   ETH <10 12/08/2020    Metabolic Disorder Labs:  Lab Results  Component Value Date   HGBA1C 5.4 10/13/2018   MPG 108.28 10/13/2018   MPG 111.15 08/29/2018   No results found for: PROLACTIN Lab Results  Component Value Date   CHOL 169 12/17/2020   TRIG 149 12/17/2020   HDL 33 (L) 12/17/2020   CHOLHDL 5.1 12/17/2020   VLDL 30 12/17/2020   LDLCALC 106 (H) 12/17/2020   LDLCALC 93 01/03/2020    See Psychiatric Specialty Exam and Suicide Risk Assessment completed by Attending Physician prior to discharge.  Discharge destination:  Home  Is patient on multiple antipsychotic therapies at discharge:  No   Has Patient had three or more failed trials of antipsychotic monotherapy by history:  No  Recommended Plan for Multiple Antipsychotic Therapies: NA  Discharge Instructions    Diet - low sodium heart healthy   Complete by: As directed    Increase activity slowly   Complete by: As directed      Allergies as of 12/29/2020      Reactions   Doxepin Other (See Comments)   Pt reports "that made me have seizures"   Valproic Acid Shortness Of Breath   Aspirin    REACTION: UNSPECIFIED   Atorvastatin Other (See Comments)   Tongue swelling, legs cramping   Baclofen    REACTION: Throat swells up   Duloxetine Other (See Comments)   Vision  loss, weight loss   Erythromycin    REACTION: UNSPECIFIED   Gabapentin    REACTION: throat swells up   Metoclopramide Hcl    REACTION: states "messed me up"   Nortriptyline    Other reaction(s): Other (See Comments) Vision issues   Nsaids Other (See Comments)   Stomach problems   Nucynta Er [tapentadol Hcl Er]    Intolerant, see note from 07/24/12.     Nucynta [tapentadol]    AMS   Prednisone    GI intolance   Pregabalin    Seroquel [quetiapine Fumerate] Other (See Comments)   Loss control, felt like on another planet   Sulfa Antibiotics Nausea And Vomiting   Topamax Other (See Comments)   Blisters in mouth and tongue   Vicodin [hydrocodone-acetaminophen]    Nausea, thrush and constipation      Medication List    STOP taking these medications   gabapentin 300 MG capsule Commonly known as: Neurontin   olanzapine zydis 15 MG disintegrating tablet Commonly known as: ZyPREXA Zydis Replaced by: OLANZapine 15 MG tablet   omeprazole 20 MG capsule Commonly known as: PRILOSEC   traZODone 100 MG tablet Commonly known as: DESYREL     TAKE these medications     Indication  albuterol 108 (90 Base) MCG/ACT inhaler Commonly known as: VENTOLIN HFA Inhale 2 puffs into the lungs every 6 (six) hours as needed for wheezing or shortness of breath.  Indication: Chronic Obstructive Lung Disease   hydrOXYzine 50 MG tablet Commonly known as: ATARAX/VISTARIL Take 1 tablet (50 mg total) by mouth 3 (three) times daily as needed for anxiety.  Indication: Feeling Anxious   levothyroxine 88 MCG tablet Commonly known as: SYNTHROID Take 1 tablet (88 mcg total) by mouth daily at 6 (six) AM.  Indication: Underactive Thyroid  mineral oil-hydrophilic petrolatum ointment Apply topically daily as needed for dry skin.  Indication: Eczema   mirtazapine 15 MG tablet Commonly known as: REMERON Take 1 tablet (15 mg total) by mouth at bedtime.  Indication: Major Depressive Disorder    OLANZapine 15 MG tablet Commonly known as: ZYPREXA Take 1 tablet (15 mg total) by mouth at bedtime. Replaces: olanzapine zydis 15 MG disintegrating tablet  Indication: Depressive Phase of Manic-Depression   pantoprazole 40 MG tablet Commonly known as: PROTONIX Take 1 tablet (40 mg total) by mouth daily. Start taking on: December 30, 2020  Indication: Gastroesophageal Reflux Disease   pregabalin 50 MG capsule Commonly known as: LYRICA Take 1 capsule (50 mg total) by mouth 2 (two) times daily.  Indication: Neuropathic Pain   valACYclovir 500 MG tablet Commonly known as: VALTREX Take 1 tablet (500 mg total) by mouth daily. Start taking on: December 30, 2020 What changed:   how much to take  how to take this  when to take this  additional instructions  Indication: Infection caused by the Varicella Zoster Virus       Follow-up [facility] on 12/31/2020.   Why: Appointment is scheduled for 12/31/2020 at 3:40PM.  Appoitnment is face to face.  Thanks!  Contact information: [place] [address] [phone]               Follow-up recommendations:  Activity:  Activity as tolerated Diet:  Regular diet Other:  Follow-up with outpatient psychiatrist as usual  Comments: Prescriptions provided at discharge  Signed: Alethia Berthold, MD 12/29/2020, 11:18 AM

## 2020-12-29 NOTE — Plan of Care (Signed)
  Problem: Group Participation Goal: STG - Patient will engage in groups without prompting or encouragement from LRT x3 group sessions within 5 recreation therapy group sessions Description: STG - Patient will engage in groups without prompting or encouragement from LRT x3 group sessions within 5 recreation therapy group sessions 12/29/2020 1247 by Ernest Haber, LRT Outcome: Not Met  12/29/2020 1246 by Ernest Haber, LRT Outcome: Not Met (add Reason) Note: Patient spent most of her time in her room.

## 2020-12-29 NOTE — Progress Notes (Signed)
Recreation Therapy Notes   Date: 12/29/2020  Time: 9:30 am   Location: Craft room     Behavioral response: N/A   Intervention Topic: Relaxation    Discussion/Intervention: Patient did not attend group.   Clinical Observations/Feedback:  Patient did not attend group.   Emrik Erhard LRT/CTRS          Bobbyjo Marulanda 12/29/2020 12:39 PM

## 2020-12-29 NOTE — Progress Notes (Signed)
D: Pt alert and oriented. Pt denies experiencing any SI/HI, or AVH at this time. Pt reports she will be able to keep herself safe when she returns home.   A: Pt received discharge and medication education/information. Pt belongings were returned and signed for at this time to include print prescriptions.   R: Pt verbalized understanding of discharge and medication education/information.  Pt escorted by staff to medical mall front lobby where pt's daughter picked her up.

## 2020-12-30 ENCOUNTER — Telehealth: Payer: Self-pay

## 2020-12-30 NOTE — Telephone Encounter (Signed)
Medication management - Fax received from pt's [facility] requesting a 90 day supply of Levothyroxine.

## 2020-12-31 DIAGNOSIS — K5900 Constipation, unspecified: Secondary | ICD-10-CM | POA: Diagnosis not present

## 2020-12-31 DIAGNOSIS — G894 Chronic pain syndrome: Secondary | ICD-10-CM | POA: Diagnosis not present

## 2020-12-31 DIAGNOSIS — M542 Cervicalgia: Secondary | ICD-10-CM | POA: Diagnosis not present

## 2020-12-31 DIAGNOSIS — M5416 Radiculopathy, lumbar region: Secondary | ICD-10-CM | POA: Diagnosis not present

## 2020-12-31 DIAGNOSIS — Z79899 Other long term (current) drug therapy: Secondary | ICD-10-CM | POA: Diagnosis not present

## 2020-12-31 DIAGNOSIS — M25569 Pain in unspecified knee: Secondary | ICD-10-CM | POA: Diagnosis not present

## 2020-12-31 DIAGNOSIS — Z79891 Long term (current) use of opiate analgesic: Secondary | ICD-10-CM | POA: Diagnosis not present

## 2020-12-31 DIAGNOSIS — M5412 Radiculopathy, cervical region: Secondary | ICD-10-CM | POA: Diagnosis not present

## 2021-01-07 ENCOUNTER — Telehealth: Payer: Self-pay | Admitting: Family Medicine

## 2021-01-07 NOTE — Telephone Encounter (Signed)
Please Advise

## 2021-01-07 NOTE — Telephone Encounter (Signed)
Patient is now calling stating that she is having a reaction to her pain patch. Throwing up etc. Please advise. EM

## 2021-01-07 NOTE — Telephone Encounter (Signed)
Patient is needing a refill on the following: andansetranat 4 mg tablet? Patient was hard to understand.  Please call patient for clarification of the medicine she is requesting. Asked to repeat several times.  EM

## 2021-01-07 NOTE — Telephone Encounter (Signed)
Error

## 2021-01-08 MED ORDER — ONDANSETRON HCL 4 MG PO TABS: 4.0000 mg | ORAL_TABLET | Freq: Three times a day (TID) | ORAL | 0 refills | Status: DC | PRN

## 2021-01-08 NOTE — Telephone Encounter (Signed)
I do not see any pain patch listed on her medication list.  I cannot advise on that since I do not know what medication to which she is referring.  If she is recurrently vomiting, then she needs evaluation.  I presume the 4 mg medication in question would be ondansetron.  I did send that prescription in the meantime.  According to her inpatient record she was supposed to have outpatient psychiatric follow-up on January 6.  If that did happen, then please request the notes.  If that did not happen, then please have her/her family contact psychiatric clinic to schedule follow-up.  Thanks.  [facility] on 12/31/2020.    Contact information: [place] [address] [phone]

## 2021-01-08 NOTE — Telephone Encounter (Addendum)
Agree with having her contact the rx'ing MD/clinic. Thanks.

## 2021-01-08 NOTE — Addendum Note (Signed)
Addended by: Tonia Ghent on: 01/08/2021 06:10 AM   Modules accepted: Orders

## 2021-01-08 NOTE — Telephone Encounter (Signed)
Spoke with patient and the patch is was using is the buprenorphine 15 mcg per HR; states it is a 7 day patch. Patient used patch for 4 days before she took it off. Patient did take the ondansetron that was called in and that helped her a lot with the nausea. I advised patient not to use anymore of the patches and to be sure to contact the office that prescribed them to her. Patient states she did call Dr. Shela Commons whom prescribed them but has not heard back from their office yet. Also patient did go to her psy appt on 12/31/20.

## 2021-01-08 NOTE — Telephone Encounter (Signed)
LMTCB

## 2021-02-03 ENCOUNTER — Other Ambulatory Visit: Payer: Self-pay | Admitting: Family Medicine

## 2021-02-17 DIAGNOSIS — Z79891 Long term (current) use of opiate analgesic: Secondary | ICD-10-CM | POA: Diagnosis not present

## 2021-02-17 DIAGNOSIS — K5900 Constipation, unspecified: Secondary | ICD-10-CM | POA: Diagnosis not present

## 2021-02-17 DIAGNOSIS — M5416 Radiculopathy, lumbar region: Secondary | ICD-10-CM | POA: Diagnosis not present

## 2021-02-17 DIAGNOSIS — G894 Chronic pain syndrome: Secondary | ICD-10-CM | POA: Diagnosis not present

## 2021-02-17 DIAGNOSIS — M5412 Radiculopathy, cervical region: Secondary | ICD-10-CM | POA: Diagnosis not present

## 2021-02-17 DIAGNOSIS — M25569 Pain in unspecified knee: Secondary | ICD-10-CM | POA: Diagnosis not present

## 2021-02-17 DIAGNOSIS — M542 Cervicalgia: Secondary | ICD-10-CM | POA: Diagnosis not present

## 2021-02-17 DIAGNOSIS — G4700 Insomnia, unspecified: Secondary | ICD-10-CM | POA: Diagnosis not present

## 2021-03-10 ENCOUNTER — Other Ambulatory Visit: Payer: Self-pay | Admitting: Psychiatry

## 2021-03-11 DIAGNOSIS — K5900 Constipation, unspecified: Secondary | ICD-10-CM | POA: Diagnosis not present

## 2021-03-11 DIAGNOSIS — M5412 Radiculopathy, cervical region: Secondary | ICD-10-CM | POA: Diagnosis not present

## 2021-03-11 DIAGNOSIS — M5416 Radiculopathy, lumbar region: Secondary | ICD-10-CM | POA: Diagnosis not present

## 2021-03-11 DIAGNOSIS — G894 Chronic pain syndrome: Secondary | ICD-10-CM | POA: Diagnosis not present

## 2021-03-11 DIAGNOSIS — M25569 Pain in unspecified knee: Secondary | ICD-10-CM | POA: Diagnosis not present

## 2021-03-11 DIAGNOSIS — M542 Cervicalgia: Secondary | ICD-10-CM | POA: Diagnosis not present

## 2021-03-11 DIAGNOSIS — G4700 Insomnia, unspecified: Secondary | ICD-10-CM | POA: Diagnosis not present

## 2021-03-11 DIAGNOSIS — Z79891 Long term (current) use of opiate analgesic: Secondary | ICD-10-CM | POA: Diagnosis not present

## 2021-03-25 DIAGNOSIS — F17200 Nicotine dependence, unspecified, uncomplicated: Secondary | ICD-10-CM | POA: Diagnosis not present

## 2021-04-14 ENCOUNTER — Telehealth: Payer: Self-pay

## 2021-04-14 DIAGNOSIS — M542 Cervicalgia: Secondary | ICD-10-CM | POA: Diagnosis not present

## 2021-04-14 DIAGNOSIS — Z79891 Long term (current) use of opiate analgesic: Secondary | ICD-10-CM | POA: Diagnosis not present

## 2021-04-14 DIAGNOSIS — M25569 Pain in unspecified knee: Secondary | ICD-10-CM | POA: Diagnosis not present

## 2021-04-14 DIAGNOSIS — G894 Chronic pain syndrome: Secondary | ICD-10-CM | POA: Diagnosis not present

## 2021-04-14 DIAGNOSIS — G4700 Insomnia, unspecified: Secondary | ICD-10-CM | POA: Diagnosis not present

## 2021-04-14 DIAGNOSIS — Z79899 Other long term (current) drug therapy: Secondary | ICD-10-CM | POA: Diagnosis not present

## 2021-04-14 DIAGNOSIS — M5416 Radiculopathy, lumbar region: Secondary | ICD-10-CM | POA: Diagnosis not present

## 2021-04-14 DIAGNOSIS — M5412 Radiculopathy, cervical region: Secondary | ICD-10-CM | POA: Diagnosis not present

## 2021-04-14 DIAGNOSIS — K5900 Constipation, unspecified: Secondary | ICD-10-CM | POA: Diagnosis not present

## 2021-04-14 NOTE — Telephone Encounter (Signed)
Pt spoke with Donzetta Matters to schedule annual exam and while talking pt said she was having SOB. I spoke with pt and she has been having a dry cough for a while, pt does not feel like the lungs are working at full capacity; pt has rattling in chest and is SOB all the time even when resting. Pt is having some problem talking in full sentences due to her breathing. Pt said some of her breathing problem is coming from her pain meds being stopped after suicide attempt in Jan 2022. Pt declines going to ED and does not want 911 called. Pt said her daughter will take pt to [facility] in [place]. Sending note to Dr Damita Dunnings.

## 2021-04-14 NOTE — Telephone Encounter (Signed)
Please get update on patient tomorrow.  If SOB at all, would rec eval in the meantime.  Thanks.

## 2021-04-15 NOTE — Telephone Encounter (Signed)
Tried to call patient but no answer and VM is full

## 2021-04-16 NOTE — Telephone Encounter (Signed)
Tried to call patient but no answer and VM is full.  

## 2021-04-21 NOTE — Telephone Encounter (Signed)
Tried to call patient again but no answer and VM is full. I have checked EMR and do not see where she was seen anywhere. Patient has an upcming appt with Dr. Damita Dunnings on 04/26/21 at 3:30 pm.

## 2021-04-26 ENCOUNTER — Ambulatory Visit (INDEPENDENT_AMBULATORY_CARE_PROVIDER_SITE_OTHER): Payer: Medicare Other | Admitting: Family Medicine

## 2021-04-26 ENCOUNTER — Other Ambulatory Visit: Payer: Self-pay

## 2021-04-26 ENCOUNTER — Encounter: Payer: Self-pay | Admitting: Family Medicine

## 2021-04-26 VITALS — BP 106/74 | HR 104 | Temp 99.0°F | Ht 59.0 in | Wt 137.0 lb

## 2021-04-26 DIAGNOSIS — Z0000 Encounter for general adult medical examination without abnormal findings: Secondary | ICD-10-CM | POA: Diagnosis not present

## 2021-04-26 DIAGNOSIS — R059 Cough, unspecified: Secondary | ICD-10-CM

## 2021-04-26 DIAGNOSIS — F319 Bipolar disorder, unspecified: Secondary | ICD-10-CM

## 2021-04-26 DIAGNOSIS — Z7189 Other specified counseling: Secondary | ICD-10-CM

## 2021-04-26 DIAGNOSIS — F17200 Nicotine dependence, unspecified, uncomplicated: Secondary | ICD-10-CM

## 2021-04-26 MED ORDER — ALBUTEROL SULFATE HFA 108 (90 BASE) MCG/ACT IN AERS: 2.0000 | INHALATION_SPRAY | Freq: Four times a day (QID) | RESPIRATORY_TRACT | 2 refills | Status: DC | PRN

## 2021-04-26 MED ORDER — LEVOTHYROXINE SODIUM 88 MCG PO TABS: ORAL_TABLET | ORAL | 3 refills | Status: DC

## 2021-04-26 MED ORDER — BUDESONIDE-FORMOTEROL FUMARATE 80-4.5 MCG/ACT IN AERO: 2.0000 | INHALATION_SPRAY | Freq: Two times a day (BID) | RESPIRATORY_TRACT | 12 refills | Status: DC

## 2021-04-26 NOTE — Patient Instructions (Addendum)
Call [facility] when possible.  I'll check on the CT scan.  Rinse after using symbicort twice a day.  Use albuterol if needed.  If you can't get symbicort, then let me know.  Take care.  Glad to see you.

## 2021-04-26 NOTE — Progress Notes (Signed)
This visit occurred during the SARS-CoV-2 public health emergency.  Safety protocols were in place, including screening questions prior to the visit, additional usage of staff PPE, and extensive cleaning of exam room while observing appropriate contact time as indicated for disinfecting solutions.  I have personally reviewed the Medicare Annual Wellness questionnaire and have noted 1. The patient's medical and social history 2. Their use of alcohol, tobacco or illicit drugs 3. Their current medications and supplements 4. The patient's functional ability including ADL's, fall risks, home safety risks and hearing or visual             impairment. 5. Diet and physical activities 6. Evidence for depression or mood disorders  The patients weight, height, BMI have been recorded in the chart and visual acuity is per eye clinic.  I have made referrals, counseling and provided education to the patient based review of the above and I have provided the pt with a written personalized care plan for preventive services.  Provider list updated- see scanned forms.  Routine anticipatory guidance given to patient.  See health maintenance. The possibility exists that previously documented standard health maintenance information may have been brought forward from a previous encounter into this note.  If needed, that same information has been updated to reflect the current situation based on today's encounter.    Flu discussed, declined Shingles discussed, declined. PNA.  Discussed, declined. Tetanus 2018. Discussed, declined. Colon discussed.  Advised to call about follow-up.  See after visit summary. Breast cancer screening-2020 Advance directive-daughter designated patient were incapacitated Cognitive function addressed- see scanned forms- and if abnormal then additional documentation follows.   Lung CT screening d/w pt. patient absent.  Referral placed.  Smoker.  Cough and wheeze noted.  Discussed inhaler  use.  Precontemplative for smoking cessation.  She is living alone in an apartment.  She is still seeing pain clinic and psychiatry.  Patient doesn't have contact with her grandson.  No suicidal or homicidal intent.  PMH and SH reviewed  Meds, vitals, and allergies reviewed.   ROS: Per HPI.  Unless specifically indicated otherwise in HPI, the patient denies:  General: fever. Eyes: acute vision changes ENT: sore throat Cardiovascular: chest pain Respiratory: SOB GI: vomiting GU: dysuria Musculoskeletal: acute back pain Derm: acute rash Neuro: acute motor dysfunction Psych: worsening mood Endocrine: polydipsia Heme: bleeding Allergy: hayfever  GEN: nad, alert and oriented HEENT: ncat NECK: supple w/o LA CV: rrr. PULM: ctab except for scant wheeze, no inc wob ABD: soft, +bs EXT: no edema SKIN: no acute rash

## 2021-04-27 DIAGNOSIS — H2512 Age-related nuclear cataract, left eye: Secondary | ICD-10-CM | POA: Diagnosis not present

## 2021-04-28 NOTE — Assessment & Plan Note (Signed)
Flu discussed, declined Shingles discussed, declined. PNA.  Discussed, declined. Tetanus 2018. Discussed, declined. Colon discussed.  Advised to call about follow-up.  See after visit summary. Breast cancer screening-2020 Advance directive-daughter designated patient were incapacitated Cognitive function addressed- see scanned forms- and if abnormal then additional documentation follows.

## 2021-04-28 NOTE — Assessment & Plan Note (Signed)
With smoking history noted.  Advised to start Symbicort, update me if she cannot get that filled.  Rinse after use.  Use albuterol if needed.  Refer for lung cancer screening.

## 2021-04-28 NOTE — Assessment & Plan Note (Signed)
Daughter designated patient were incapacitated.

## 2021-04-28 NOTE — Assessment & Plan Note (Signed)
Per psychiatry 

## 2021-05-12 DIAGNOSIS — M5416 Radiculopathy, lumbar region: Secondary | ICD-10-CM | POA: Diagnosis not present

## 2021-05-12 DIAGNOSIS — M542 Cervicalgia: Secondary | ICD-10-CM | POA: Diagnosis not present

## 2021-05-12 DIAGNOSIS — K5900 Constipation, unspecified: Secondary | ICD-10-CM | POA: Diagnosis not present

## 2021-05-12 DIAGNOSIS — M5412 Radiculopathy, cervical region: Secondary | ICD-10-CM | POA: Diagnosis not present

## 2021-05-12 DIAGNOSIS — G894 Chronic pain syndrome: Secondary | ICD-10-CM | POA: Diagnosis not present

## 2021-05-12 DIAGNOSIS — Z79891 Long term (current) use of opiate analgesic: Secondary | ICD-10-CM | POA: Diagnosis not present

## 2021-05-12 DIAGNOSIS — M25569 Pain in unspecified knee: Secondary | ICD-10-CM | POA: Diagnosis not present

## 2021-05-12 DIAGNOSIS — G4700 Insomnia, unspecified: Secondary | ICD-10-CM | POA: Diagnosis not present

## 2021-05-25 ENCOUNTER — Other Ambulatory Visit: Payer: Self-pay

## 2021-05-25 ENCOUNTER — Emergency Department: Payer: Medicare Other

## 2021-05-25 ENCOUNTER — Inpatient Hospital Stay
Admission: EM | Admit: 2021-05-25 | Discharge: 2021-05-29 | DRG: 194 | Disposition: A | Discharge status: Home Health | Payer: Medicare Other | Attending: Internal Medicine | Admitting: Internal Medicine

## 2021-05-25 DIAGNOSIS — J449 Chronic obstructive pulmonary disease, unspecified: Secondary | ICD-10-CM | POA: Diagnosis present

## 2021-05-25 DIAGNOSIS — G20 Parkinson's disease: Secondary | ICD-10-CM | POA: Diagnosis present

## 2021-05-25 DIAGNOSIS — Y92012 Bathroom of single-family (private) house as the place of occurrence of the external cause: Secondary | ICD-10-CM

## 2021-05-25 DIAGNOSIS — S80812A Abrasion, left lower leg, initial encounter: Secondary | ICD-10-CM | POA: Diagnosis present

## 2021-05-25 DIAGNOSIS — R220 Localized swelling, mass and lump, head: Secondary | ICD-10-CM | POA: Diagnosis not present

## 2021-05-25 DIAGNOSIS — Z79899 Other long term (current) drug therapy: Secondary | ICD-10-CM

## 2021-05-25 DIAGNOSIS — S0011XA Contusion of right eyelid and periocular area, initial encounter: Secondary | ICD-10-CM | POA: Diagnosis present

## 2021-05-25 DIAGNOSIS — R7881 Bacteremia: Secondary | ICD-10-CM | POA: Diagnosis not present

## 2021-05-25 DIAGNOSIS — B9689 Other specified bacterial agents as the cause of diseases classified elsewhere: Secondary | ICD-10-CM | POA: Diagnosis present

## 2021-05-25 DIAGNOSIS — R9431 Abnormal electrocardiogram [ECG] [EKG]: Secondary | ICD-10-CM | POA: Diagnosis not present

## 2021-05-25 DIAGNOSIS — J440 Chronic obstructive pulmonary disease with acute lower respiratory infection: Secondary | ICD-10-CM | POA: Diagnosis not present

## 2021-05-25 DIAGNOSIS — Z743 Need for continuous supervision: Secondary | ICD-10-CM | POA: Diagnosis not present

## 2021-05-25 DIAGNOSIS — M797 Fibromyalgia: Secondary | ICD-10-CM | POA: Diagnosis not present

## 2021-05-25 DIAGNOSIS — J441 Chronic obstructive pulmonary disease with (acute) exacerbation: Secondary | ICD-10-CM | POA: Diagnosis present

## 2021-05-25 DIAGNOSIS — F419 Anxiety disorder, unspecified: Secondary | ICD-10-CM | POA: Diagnosis present

## 2021-05-25 DIAGNOSIS — E876 Hypokalemia: Secondary | ICD-10-CM | POA: Diagnosis not present

## 2021-05-25 DIAGNOSIS — S8012XA Contusion of left lower leg, initial encounter: Secondary | ICD-10-CM | POA: Diagnosis present

## 2021-05-25 DIAGNOSIS — M542 Cervicalgia: Secondary | ICD-10-CM | POA: Diagnosis not present

## 2021-05-25 DIAGNOSIS — Y92009 Unspecified place in unspecified non-institutional (private) residence as the place of occurrence of the external cause: Secondary | ICD-10-CM

## 2021-05-25 DIAGNOSIS — W19XXXA Unspecified fall, initial encounter: Secondary | ICD-10-CM | POA: Diagnosis not present

## 2021-05-25 DIAGNOSIS — Z823 Family history of stroke: Secondary | ICD-10-CM

## 2021-05-25 DIAGNOSIS — R531 Weakness: Secondary | ICD-10-CM | POA: Diagnosis not present

## 2021-05-25 DIAGNOSIS — R7401 Elevation of levels of liver transaminase levels: Secondary | ICD-10-CM | POA: Diagnosis not present

## 2021-05-25 DIAGNOSIS — I2510 Atherosclerotic heart disease of native coronary artery without angina pectoris: Secondary | ICD-10-CM | POA: Diagnosis not present

## 2021-05-25 DIAGNOSIS — Z833 Family history of diabetes mellitus: Secondary | ICD-10-CM

## 2021-05-25 DIAGNOSIS — S060X1A Concussion with loss of consciousness of 30 minutes or less, initial encounter: Secondary | ICD-10-CM

## 2021-05-25 DIAGNOSIS — W182XXA Fall in (into) shower or empty bathtub, initial encounter: Secondary | ICD-10-CM | POA: Diagnosis present

## 2021-05-25 DIAGNOSIS — S060X9A Concussion with loss of consciousness of unspecified duration, initial encounter: Secondary | ICD-10-CM | POA: Diagnosis not present

## 2021-05-25 DIAGNOSIS — K219 Gastro-esophageal reflux disease without esophagitis: Secondary | ICD-10-CM | POA: Diagnosis present

## 2021-05-25 DIAGNOSIS — I248 Other forms of acute ischemic heart disease: Secondary | ICD-10-CM | POA: Diagnosis not present

## 2021-05-25 DIAGNOSIS — Z881 Allergy status to other antibiotic agents status: Secondary | ICD-10-CM

## 2021-05-25 DIAGNOSIS — N3941 Urge incontinence: Secondary | ICD-10-CM | POA: Diagnosis present

## 2021-05-25 DIAGNOSIS — K449 Diaphragmatic hernia without obstruction or gangrene: Secondary | ICD-10-CM

## 2021-05-25 DIAGNOSIS — R52 Pain, unspecified: Secondary | ICD-10-CM | POA: Diagnosis not present

## 2021-05-25 DIAGNOSIS — F310 Bipolar disorder, current episode hypomanic: Secondary | ICD-10-CM | POA: Diagnosis present

## 2021-05-25 DIAGNOSIS — T796XXA Traumatic ischemia of muscle, initial encounter: Secondary | ICD-10-CM | POA: Diagnosis not present

## 2021-05-25 DIAGNOSIS — F17210 Nicotine dependence, cigarettes, uncomplicated: Secondary | ICD-10-CM | POA: Diagnosis present

## 2021-05-25 DIAGNOSIS — M6282 Rhabdomyolysis: Secondary | ICD-10-CM | POA: Diagnosis not present

## 2021-05-25 DIAGNOSIS — S299XXA Unspecified injury of thorax, initial encounter: Secondary | ICD-10-CM | POA: Diagnosis not present

## 2021-05-25 DIAGNOSIS — Z90710 Acquired absence of both cervix and uterus: Secondary | ICD-10-CM

## 2021-05-25 DIAGNOSIS — Z79890 Hormone replacement therapy (postmenopausal): Secondary | ICD-10-CM

## 2021-05-25 DIAGNOSIS — R609 Edema, unspecified: Secondary | ICD-10-CM | POA: Diagnosis not present

## 2021-05-25 DIAGNOSIS — J189 Pneumonia, unspecified organism: Principal | ICD-10-CM

## 2021-05-25 DIAGNOSIS — Y93E1 Activity, personal bathing and showering: Secondary | ICD-10-CM | POA: Diagnosis not present

## 2021-05-25 DIAGNOSIS — G4700 Insomnia, unspecified: Secondary | ICD-10-CM | POA: Diagnosis not present

## 2021-05-25 DIAGNOSIS — J9811 Atelectasis: Secondary | ICD-10-CM | POA: Diagnosis not present

## 2021-05-25 DIAGNOSIS — Z888 Allergy status to other drugs, medicaments and biological substances status: Secondary | ICD-10-CM

## 2021-05-25 DIAGNOSIS — Z882 Allergy status to sulfonamides status: Secondary | ICD-10-CM

## 2021-05-25 DIAGNOSIS — E039 Hypothyroidism, unspecified: Secondary | ICD-10-CM | POA: Diagnosis not present

## 2021-05-25 DIAGNOSIS — Z7951 Long term (current) use of inhaled steroids: Secondary | ICD-10-CM

## 2021-05-25 DIAGNOSIS — Z20822 Contact with and (suspected) exposure to covid-19: Secondary | ICD-10-CM | POA: Diagnosis not present

## 2021-05-25 DIAGNOSIS — Z886 Allergy status to analgesic agent status: Secondary | ICD-10-CM

## 2021-05-25 DIAGNOSIS — K5750 Diverticulosis of both small and large intestine without perforation or abscess without bleeding: Secondary | ICD-10-CM | POA: Diagnosis not present

## 2021-05-25 DIAGNOSIS — Z8261 Family history of arthritis: Secondary | ICD-10-CM

## 2021-05-25 DIAGNOSIS — J439 Emphysema, unspecified: Secondary | ICD-10-CM | POA: Diagnosis not present

## 2021-05-25 DIAGNOSIS — R404 Transient alteration of awareness: Secondary | ICD-10-CM | POA: Diagnosis not present

## 2021-05-25 DIAGNOSIS — K4020 Bilateral inguinal hernia, without obstruction or gangrene, not specified as recurrent: Secondary | ICD-10-CM | POA: Diagnosis not present

## 2021-05-25 DIAGNOSIS — Z801 Family history of malignant neoplasm of trachea, bronchus and lung: Secondary | ICD-10-CM

## 2021-05-25 DIAGNOSIS — Z872 Personal history of diseases of the skin and subcutaneous tissue: Secondary | ICD-10-CM

## 2021-05-25 DIAGNOSIS — M549 Dorsalgia, unspecified: Secondary | ICD-10-CM | POA: Diagnosis not present

## 2021-05-25 DIAGNOSIS — R519 Headache, unspecified: Secondary | ICD-10-CM | POA: Diagnosis not present

## 2021-05-25 DIAGNOSIS — Z8249 Family history of ischemic heart disease and other diseases of the circulatory system: Secondary | ICD-10-CM

## 2021-05-25 DIAGNOSIS — K8020 Calculus of gallbladder without cholecystitis without obstruction: Secondary | ICD-10-CM | POA: Diagnosis not present

## 2021-05-25 DIAGNOSIS — S3991XA Unspecified injury of abdomen, initial encounter: Secondary | ICD-10-CM | POA: Diagnosis not present

## 2021-05-25 LAB — HEPATIC FUNCTION PANEL
ALT: 101 U/L — ABNORMAL HIGH (ref 0–44)
AST: 194 U/L — ABNORMAL HIGH (ref 15–41)
Albumin: 4.1 g/dL (ref 3.5–5.0)
Alkaline Phosphatase: 91 U/L (ref 38–126)
Bilirubin, Direct: 0.1 mg/dL (ref 0.0–0.2)
Indirect Bilirubin: 0.8 mg/dL (ref 0.3–0.9)
Total Bilirubin: 0.9 mg/dL (ref 0.3–1.2)
Total Protein: 7.7 g/dL (ref 6.5–8.1)

## 2021-05-25 LAB — CBC WITH DIFFERENTIAL/PLATELET
Abs Immature Granulocytes: 0.09 10*3/uL — ABNORMAL HIGH (ref 0.00–0.07)
Basophils Absolute: 0.1 10*3/uL (ref 0.0–0.1)
Basophils Relative: 0 %
Eosinophils Absolute: 0 10*3/uL (ref 0.0–0.5)
Eosinophils Relative: 0 %
HCT: 43.1 % (ref 36.0–46.0)
Hemoglobin: 14.7 g/dL (ref 12.0–15.0)
Immature Granulocytes: 1 %
Lymphocytes Relative: 8 %
Lymphs Abs: 1.6 10*3/uL (ref 0.7–4.0)
MCH: 31.6 pg (ref 26.0–34.0)
MCHC: 34.1 g/dL (ref 30.0–36.0)
MCV: 92.7 fL (ref 80.0–100.0)
Monocytes Absolute: 0.9 10*3/uL (ref 0.1–1.0)
Monocytes Relative: 5 %
Neutro Abs: 17.2 10*3/uL — ABNORMAL HIGH (ref 1.7–7.7)
Neutrophils Relative %: 86 %
Platelets: 242 10*3/uL (ref 150–400)
RBC: 4.65 MIL/uL (ref 3.87–5.11)
RDW: 14.1 % (ref 11.5–15.5)
WBC: 19.9 10*3/uL — ABNORMAL HIGH (ref 4.0–10.5)
nRBC: 0 % (ref 0.0–0.2)

## 2021-05-25 LAB — URINE DRUG SCREEN, QUALITATIVE (ARMC ONLY)
Amphetamines, Ur Screen: NOT DETECTED
Barbiturates, Ur Screen: NOT DETECTED
Benzodiazepine, Ur Scrn: NOT DETECTED
Cannabinoid 50 Ng, Ur ~~LOC~~: POSITIVE — AB
Cocaine Metabolite,Ur ~~LOC~~: NOT DETECTED
MDMA (Ecstasy)Ur Screen: NOT DETECTED
Methadone Scn, Ur: NOT DETECTED
Opiate, Ur Screen: POSITIVE — AB
Phencyclidine (PCP) Ur S: NOT DETECTED
Tricyclic, Ur Screen: POSITIVE — AB

## 2021-05-25 LAB — URINALYSIS, COMPLETE (UACMP) WITH MICROSCOPIC
Bilirubin Urine: NEGATIVE
Glucose, UA: NEGATIVE mg/dL
Hgb urine dipstick: NEGATIVE
Ketones, ur: 5 mg/dL — AB
Leukocytes,Ua: NEGATIVE
Nitrite: NEGATIVE
Protein, ur: 30 mg/dL — AB
Specific Gravity, Urine: 1.019 (ref 1.005–1.030)
pH: 5 (ref 5.0–8.0)

## 2021-05-25 LAB — RESP PANEL BY RT-PCR (FLU A&B, COVID) ARPGX2
Influenza A by PCR: NEGATIVE
Influenza B by PCR: NEGATIVE
SARS Coronavirus 2 by RT PCR: NEGATIVE

## 2021-05-25 LAB — LACTIC ACID, PLASMA
Lactic Acid, Venous: 1 mmol/L (ref 0.5–1.9)
Lactic Acid, Venous: 0.8 mmol/L (ref 0.5–1.9)

## 2021-05-25 LAB — BASIC METABOLIC PANEL
Anion gap: 11 (ref 5–15)
BUN: 21 mg/dL (ref 8–23)
CO2: 25 mmol/L (ref 22–32)
Calcium: 9.7 mg/dL (ref 8.9–10.3)
Chloride: 103 mmol/L (ref 98–111)
Creatinine, Ser: 0.97 mg/dL (ref 0.44–1.00)
GFR, Estimated: >60 mL/min (ref >60)
Glucose, Bld: 114 mg/dL — ABNORMAL HIGH (ref 70–99)
Potassium: 3.9 mmol/L (ref 3.5–5.1)
Sodium: 139 mmol/L (ref 135–145)

## 2021-05-25 LAB — CK: Total CK: 7474 U/L — ABNORMAL HIGH (ref 38–234)

## 2021-05-25 LAB — ETHANOL: Alcohol, Ethyl (B): <10 mg/dL (ref <10)

## 2021-05-25 LAB — BRAIN NATRIURETIC PEPTIDE: B Natriuretic Peptide: 75.3 pg/mL (ref 0.0–100.0)

## 2021-05-25 LAB — TROPONIN I (HIGH SENSITIVITY)
Troponin I (High Sensitivity): 18 ng/L — ABNORMAL HIGH (ref <18)
Troponin I (High Sensitivity): 16 ng/L (ref <18)

## 2021-05-25 LAB — PROCALCITONIN: Procalcitonin: 2.22 ng/mL

## 2021-05-25 MED ORDER — HYDROXYZINE HCL 50 MG PO TABS
50.0000 mg | ORAL_TABLET | Freq: Three times a day (TID) | ORAL | Status: DC | PRN
  Administered 2021-05-27 – 2021-05-28 (×2): 50 mg via ORAL
  Filled 2021-05-25 (×3): qty 1

## 2021-05-25 MED ORDER — ONDANSETRON HCL 4 MG/2ML IJ SOLN
4.0000 mg | Freq: Once | INTRAMUSCULAR | Status: AC
  Administered 2021-05-25: 4 mg via INTRAVENOUS
  Filled 2021-05-25: qty 2

## 2021-05-25 MED ORDER — TIZANIDINE HCL 4 MG PO TABS
4.0000 mg | ORAL_TABLET | Freq: Three times a day (TID) | ORAL | Status: DC | PRN
  Filled 2021-05-25: qty 1

## 2021-05-25 MED ORDER — POLYETHYLENE GLYCOL 3350 17 G PO PACK: 17.0000 g | PACK | Freq: Every day | ORAL | Status: DC | PRN

## 2021-05-25 MED ORDER — ACETAMINOPHEN 500 MG PO TABS
1000.0000 mg | ORAL_TABLET | Freq: Once | ORAL | Status: AC
  Administered 2021-05-25: 1000 mg via ORAL
  Filled 2021-05-25: qty 2

## 2021-05-25 MED ORDER — LEVOTHYROXINE SODIUM 88 MCG PO TABS
88.0000 ug | ORAL_TABLET | Freq: Every day | ORAL | Status: DC
  Administered 2021-05-26 – 2021-05-29 (×4): 88 ug via ORAL
  Filled 2021-05-25 (×4): qty 1

## 2021-05-25 MED ORDER — ACETAMINOPHEN 650 MG RE SUPP: 650.0000 mg | Freq: Four times a day (QID) | RECTAL | Status: DC | PRN

## 2021-05-25 MED ORDER — ACETAMINOPHEN 325 MG PO TABS
650.0000 mg | ORAL_TABLET | Freq: Four times a day (QID) | ORAL | Status: DC | PRN
  Administered 2021-05-26 – 2021-05-29 (×3): 650 mg via ORAL
  Filled 2021-05-25 (×3): qty 2

## 2021-05-25 MED ORDER — DOXYCYCLINE HYCLATE 100 MG PO TABS
100.0000 mg | ORAL_TABLET | Freq: Once | ORAL | Status: AC
  Administered 2021-05-25: 100 mg via ORAL
  Filled 2021-05-25: qty 1

## 2021-05-25 MED ORDER — ENOXAPARIN SODIUM 40 MG/0.4ML IJ SOSY
40.0000 mg | PREFILLED_SYRINGE | INTRAMUSCULAR | Status: DC
  Administered 2021-05-25 – 2021-05-28 (×4): 40 mg via SUBCUTANEOUS
  Filled 2021-05-25 (×4): qty 0.4

## 2021-05-25 MED ORDER — SODIUM CHLORIDE 0.9 % IV BOLUS
1000.0000 mL | Freq: Once | INTRAVENOUS | Status: AC
  Administered 2021-05-25: 1000 mL via INTRAVENOUS

## 2021-05-25 MED ORDER — SODIUM CHLORIDE 0.9 % IV SOLN
2.0000 g | INTRAVENOUS | Status: DC
  Administered 2021-05-25 – 2021-05-28 (×4): 2 g via INTRAVENOUS
  Filled 2021-05-25 (×5): qty 20

## 2021-05-25 MED ORDER — MIRTAZAPINE 15 MG PO TABS
15.0000 mg | ORAL_TABLET | Freq: Every day | ORAL | Status: DC
  Administered 2021-05-25 – 2021-05-28 (×4): 15 mg via ORAL
  Filled 2021-05-25 (×4): qty 1

## 2021-05-25 MED ORDER — OLANZAPINE 5 MG PO TABS
15.0000 mg | ORAL_TABLET | Freq: Every day | ORAL | Status: DC
  Administered 2021-05-25 – 2021-05-28 (×4): 15 mg via ORAL
  Filled 2021-05-25 (×2): qty 3
  Filled 2021-05-25: qty 1
  Filled 2021-05-25: qty 3

## 2021-05-25 MED ORDER — LIDOCAINE 5 % EX PTCH
1.0000 | MEDICATED_PATCH | CUTANEOUS | Status: DC
  Administered 2021-05-25 – 2021-05-28 (×4): 1 via TRANSDERMAL
  Filled 2021-05-25 (×5): qty 1

## 2021-05-25 MED ORDER — SODIUM CHLORIDE 0.9 % IV SOLN: INTRAVENOUS | Status: AC

## 2021-05-25 MED ORDER — ALBUTEROL SULFATE (2.5 MG/3ML) 0.083% IN NEBU: 3.0000 mL | INHALATION_SOLUTION | Freq: Four times a day (QID) | RESPIRATORY_TRACT | Status: DC | PRN

## 2021-05-25 MED ORDER — DOXYCYCLINE HYCLATE 100 MG PO TABS
100.0000 mg | ORAL_TABLET | Freq: Two times a day (BID) | ORAL | Status: DC
  Administered 2021-05-26 – 2021-05-29 (×7): 100 mg via ORAL
  Filled 2021-05-25 (×7): qty 1

## 2021-05-25 MED ORDER — PANTOPRAZOLE SODIUM 40 MG PO TBEC
40.0000 mg | DELAYED_RELEASE_TABLET | Freq: Every day | ORAL | Status: DC
  Administered 2021-05-26 – 2021-05-29 (×4): 40 mg via ORAL
  Filled 2021-05-25 (×4): qty 1

## 2021-05-25 NOTE — ED Triage Notes (Addendum)
First Nurse Note:  Per EMS.  Patient fell yesterday morning in shower.  C/O black eye and nausea and vomiting.  4mg  Zofran IV given by EMS.  VS wnl.  Per report, patient ambulatory at home.  No deficits.

## 2021-05-25 NOTE — ED Notes (Signed)
See triage note, pt reports falling Sunday night after slipping on shower curtain, hitting head and positive LOC. Right eye bruising and swelling noted

## 2021-05-25 NOTE — ED Notes (Signed)
Pt to xray

## 2021-05-25 NOTE — ED Notes (Signed)
Lab contacted to add on ETOH

## 2021-05-25 NOTE — ED Notes (Signed)
Pt to CT , appears to be confused intermittently, changing story about injury and pain locations. Dr Jari Pigg notified

## 2021-05-25 NOTE — ED Provider Notes (Addendum)
[facility] Emergency Department Provider Note  ____________________________________________   None    (approximate)  I have reviewed the triage vital signs and the nursing notes.   HISTORY  Chief Complaint Fall    HPI Leslie Wong is a 65 y.o. female with Parkinson's who comes in for a fall.  Patient reports having a mechanical fall yesterday morning the shower.  She states that she slipped on the curtain. She is unsure if she had LOC. She is unsure how long she was on the ground for.  She states today she able to walk around but she just feels off and has had some nausea and vomiting since the fall.  This been constant, nothing makes better, nothing makes it worse.  She reports pain over her entire body as well.  No blurry vision in her right eye but does have bruising around it.           Past Medical History:  Diagnosis Date  . Back pain   . Cancer ([place])    skin  . Depression    BAD  . Gastroparesis   . GERD (gastroesophageal reflux disease)   . Hypothyroidism   . Insomnia   . Migraines   . Parkinson disease ([place])    per [facility], dx'd 2019  . Recurrent cold sores   . Shoulder bursitis   . Urge incontinence     Patient Active Problem List   Diagnosis Date Noted  . Bipolar affective disorder, depressed, severe, with psychotic behavior ([place]) 12/16/2020  . Bipolar disorder in remission ([place]) 12/09/2020  . Pulmonary nodule 06/01/2020  . Weight loss 05/04/2020  . Bipolar 1 disorder ([place]) 03/24/2020  . Bipolar I disorder, most recent episode depressed ([place]) 01/07/2020  . MDD (major depressive disorder) 01/02/2020  . Depression 12/11/2019  . Paranoia ([place])   . Cannabis abuse 05/28/2019  . Bipolar 1 disorder, mixed, severe ([place]) 05/27/2019  . Opiate dependence ([place]) 04/25/2019  . Cracking skin 01/27/2019  . Delusion ([place]) 09/26/2018  . Night sweats 05/13/2018  . Pupil asymmetry 05/13/2018  . DDD (degenerative disc  disease), cervical 04/23/2018  . Parkinson disease ([place])   . HLD (hyperlipidemia) 03/10/2016  . Advance care planning 03/10/2016  . Cervical radiculopathy 07/30/2015  . Osteopenia 04/18/2014  . Medicare annual wellness visit, subsequent 03/18/2014  . Cough 01/17/2013  . Shoulder pain 01/17/2013  . Chronic back pain 09/14/2011  . Hypercalcemia 03/25/2010  . INCONTINENCE, URGE 04/16/2009  . CARPAL TUNNEL SYNDROME, BILATERAL 09/24/2008  . 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 Hypothyroidism 06/05/2007  . Bipolar affective disorder, current episode hypomanic ([place]) 06/05/2007  . MIGRAINE HEADACHE 06/05/2007  . Gastroparesis 06/05/2007  . Gastroesophageal reflux disease with hiatal hernia 06/05/2007  . Irritable bowel syndrome 06/05/2007  . FIBROCYSTIC BREAST DISEASE 06/05/2007    Past Surgical History:  Procedure Laterality Date  . ABDOMINAL HYSTERECTOMY  1999   total  . APPENDECTOMY    . ESOPHAGOGASTRODUODENOSCOPY  01/2006   negative except small hiatal hernia  . ESOPHAGOGASTRODUODENOSCOPY  02/24/2015   See report  . LAPAROSCOPY     for endometriosis  . OVARIAN CYST REMOVAL  1990   unilateral  . TONSILLECTOMY AND ADENOIDECTOMY      Prior to Admission medications   Medication Sig Start Date End Date Taking? Authorizing Provider  albuterol (VENTOLIN HFA) 108 (90 Base) MCG/ACT inhaler Inhale 2 puffs into the lungs every 6 (six) hours as needed for wheezing or shortness of breath. 04/26/21  Tonia Ghent, MD  budesonide-formoterol [facility]) 80-4.5 MCG/ACT inhaler Inhale 2 puffs into the lungs 2 (two) times daily. Rinse after use. 04/26/21   Tonia Ghent, MD  hydrOXYzine (ATARAX/VISTARIL) 50 MG tablet Take 1 tablet (50 mg total) by mouth 3 (three) times daily as needed for anxiety. 12/29/20   [name], [name], MD  levothyroxine (SYNTHROID) 88 MCG tablet TAKE 1 TABLET(88 MCG) BY MOUTH DAILY AT 6 AM 04/26/21   Tonia Ghent, MD  mineral oil-hydrophilic petrolatum (AQUAPHOR) ointment  Apply topically daily as needed for dry skin. 12/29/20   [name], [name], MD  mirtazapine (REMERON) 15 MG tablet Take 1 tablet (15 mg total) by mouth at bedtime. 12/29/20   [name], [name], MD  OLANZapine (ZYPREXA) 15 MG tablet Take 1 tablet (15 mg total) by mouth at bedtime. 12/29/20   [name], [name], MD  ondansetron (ZOFRAN) 4 MG tablet Take 1 tablet (4 mg total) by mouth every 8 (eight) hours as needed for nausea or vomiting. 01/08/21   Tonia Ghent, MD  pantoprazole (PROTONIX) 40 MG tablet Take 1 tablet (40 mg total) by mouth daily. 12/30/20   [name], [name], MD  pregabalin (LYRICA) 150 MG capsule Take 150 mg by mouth 3 (three) times daily. 04/14/21   [provider]  tiZANidine (ZANAFLEX) 4 MG tablet Take 4 mg by mouth 3 (three) times daily as needed. 04/14/21   [provider]  valACYclovir (VALTREX) 500 MG tablet Take 1 tablet (500 mg total) by mouth daily. 12/30/20   [name], [name], MD    Allergies Doxepin, Valproic acid, Aspirin, Atorvastatin, Baclofen, Buprenorphine, Duloxetine, Erythromycin, Gabapentin, Metoclopramide hcl, Nortriptyline, Nsaids, Nucynta er [tapentadol hcl er], Nucynta [tapentadol], Prednisone, Pregabalin, Seroquel [quetiapine fumerate], Sulfa antibiotics, Topamax, and Vicodin [hydrocodone-acetaminophen]  Family History  Problem Relation Age of Onset  . Hypertension Mother   . Arthritis Mother   . Diabetes Mother   . Stroke Mother   . Cancer Father        lung  . Colon cancer Neg Hx   . Breast cancer Neg Hx     Social History Social History   Tobacco Use  . Smoking status: Current Some Day Smoker    Packs/day: 0.25    Years: 42.50    Pack years: 10.62    Types: Cigarettes  . Smokeless tobacco: Former Systems developer  . Tobacco comment: declined  Vaping Use  . Vaping Use: Never used  Substance Use Topics  . Alcohol use: No    Alcohol/week: 0.0 standard drinks  . Drug use: No      Review of Systems Constitutional: No fever/chills, fall Eyes:  No visual changes.  Black eye on the right ENT: No sore throat. Cardiovascular: Denies chest pain. Respiratory: Denies shortness of breath. Gastrointestinal: No abdominal pain.  No nausea, no vomiting.  No diarrhea.  No constipation. Genitourinary: Negative for dysuria. Musculoskeletal: Positive back pain, pain all over her body Skin: Negative for rash. Neurological: Negative for headaches, focal weakness or numbness. All other ROS negative ____________________________________________   PHYSICAL EXAM:  VITAL SIGNS: ED Triage Vitals  Enc Vitals Group     BP 05/25/21 1453 121/81     Pulse Rate 05/25/21 1453 94     Resp 05/25/21 1453 19     Temp 05/25/21 1453 98.3 F (36.8 C)     Temp src --      SpO2 05/25/21 1453 97 %     Weight 05/25/21 1453 145 lb (65.8 kg)  Height 05/25/21 1453 4\' 11"  (1.499 m)     Head Circumference --      Peak Flow --      Pain Score 05/25/21 1459 8     Pain Loc --      Pain Edu? --      Excl. in [place]? --     Constitutional: Alert and oriented. Well appearing and in no acute distress. Eyes: Conjunctivae are normal. EOMI. bruising noted around the right eye but vision is intact. Head: Positive bruising around the right eye Nose: No congestion/rhinnorhea. Mouth/Throat: Mucous membranes are moist.   Neck: No stridor. Trachea Midline. FROM Cardiovascular: Normal rate, regular rhythm. Grossly normal heart sounds.  Good peripheral circulation.  Some chest wall tenderness. Respiratory: Normal respiratory effort.  No retractions. Lungs CTAB. Gastrointestinal: Soft and nontender. No distention. No abdominal bruits.  Musculoskeletal: Patient is small abrasion of her left tib-fib.  However she still able to bear weight.  She reports pain with palpation over all of her arms and legs but there are no other evidence of bruising and patient is able to range all of her joints and has good strength throughout  Neurologic:  Normal speech and language. No gross focal  neurologic deficits are appreciated.  Skin:  Skin is warm, dry and intact. No rash noted. Psychiatric: Mood and affect are normal. Speech and behavior are normal. GU: Deferred  Back: No CTL spine tenderness.  Some mild flank tenderness but no bruising noted ____________________________________________   LABS (all labs ordered are listed, but only abnormal results are displayed)  Labs Reviewed  CBC WITH DIFFERENTIAL/PLATELET  BASIC METABOLIC PANEL  CK  HEPATIC FUNCTION PANEL  TROPONIN I (HIGH SENSITIVITY)   ____________________________________________   ED ECG REPORT I, Vanessa Bayview, the attending physician, personally viewed and interpreted this ECG.  Normal sinus rate of 81, no ST elevation, no no T wave inversions, normal intervals ____________________________________________  RADIOLOGY  Official radiology report(s): CT Head Wo Contrast  Result Date: 05/25/2021 CLINICAL DATA:  Fall yesterday with headaches and neck pain, initial encounter EXAM: CT HEAD WITHOUT CONTRAST CT MAXILLOFACIAL WITHOUT CONTRAST CT CERVICAL SPINE WITHOUT CONTRAST TECHNIQUE: Multidetector CT imaging of the head, cervical spine, and maxillofacial structures were performed using the standard protocol without intravenous contrast. Multiplanar CT image reconstructions of the cervical spine and maxillofacial structures were also generated. COMPARISON:  None. FINDINGS: CT HEAD FINDINGS Brain: No evidence of acute infarction, hemorrhage, hydrocephalus, extra-axial collection or mass lesion/mass effect. Vascular: No hyperdense vessel or unexpected calcification. Skull: Normal. Negative for fracture or focal lesion. Other: None. CT MAXILLOFACIAL FINDINGS Osseous: No acute fracture or dislocation is noted. Patient is edentulous. Orbits: Orbits and their contents are within normal limits. Sinuses: Paranasal sinuses demonstrate mucosal thickening in the right maxillary antrum. No air-fluid level is seen. Soft tissues:  Surrounding soft tissue structures demonstrate mild right periorbital swelling consistent with the recent injury. No other soft tissue abnormality is noted. CT CERVICAL SPINE FINDINGS Alignment: Mild straightening of the normal cervical lordosis is noted likely related to muscular spasm. Skull base and vertebrae: 7 cervical segments are well visualized. Vertebral body height is well maintained. Mild facet hypertrophic changes and osteophytic changes are noted. Mild motion artifact limits the examination somewhat. No acute fracture or acute facet abnormality is noted. Soft tissues and spinal canal: Surrounding soft tissue structures are within normal limits. Upper chest: Lung apices are within normal limits. Other: None IMPRESSION: CT of the head: No acute intracranial abnormality is noted. CT  of the maxillofacial bones: No acute bony abnormality is seen. Right maxillary mucosal thickening is seen. Mild right periorbital soft tissue swelling without focal hematoma. CT of the cervical spine: Degenerative change without acute abnormality. Electronically Signed   By: Inez Catalina M.D.   On: 05/25/2021 15:21   CT Cervical Spine Wo Contrast  Result Date: 05/25/2021 CLINICAL DATA:  Fall yesterday with headaches and neck pain, initial encounter EXAM: CT HEAD WITHOUT CONTRAST CT MAXILLOFACIAL WITHOUT CONTRAST CT CERVICAL SPINE WITHOUT CONTRAST TECHNIQUE: Multidetector CT imaging of the head, cervical spine, and maxillofacial structures were performed using the standard protocol without intravenous contrast. Multiplanar CT image reconstructions of the cervical spine and maxillofacial structures were also generated. COMPARISON:  None. FINDINGS: CT HEAD FINDINGS Brain: No evidence of acute infarction, hemorrhage, hydrocephalus, extra-axial collection or mass lesion/mass effect. Vascular: No hyperdense vessel or unexpected calcification. Skull: Normal. Negative for fracture or focal lesion. Other: None. CT MAXILLOFACIAL  FINDINGS Osseous: No acute fracture or dislocation is noted. Patient is edentulous. Orbits: Orbits and their contents are within normal limits. Sinuses: Paranasal sinuses demonstrate mucosal thickening in the right maxillary antrum. No air-fluid level is seen. Soft tissues: Surrounding soft tissue structures demonstrate mild right periorbital swelling consistent with the recent injury. No other soft tissue abnormality is noted. CT CERVICAL SPINE FINDINGS Alignment: Mild straightening of the normal cervical lordosis is noted likely related to muscular spasm. Skull base and vertebrae: 7 cervical segments are well visualized. Vertebral body height is well maintained. Mild facet hypertrophic changes and osteophytic changes are noted. Mild motion artifact limits the examination somewhat. No acute fracture or acute facet abnormality is noted. Soft tissues and spinal canal: Surrounding soft tissue structures are within normal limits. Upper chest: Lung apices are within normal limits. Other: None IMPRESSION: CT of the head: No acute intracranial abnormality is noted. CT of the maxillofacial bones: No acute bony abnormality is seen. Right maxillary mucosal thickening is seen. Mild right periorbital soft tissue swelling without focal hematoma. CT of the cervical spine: Degenerative change without acute abnormality. Electronically Signed   By: Inez Catalina M.D.   On: 05/25/2021 15:21   CT Maxillofacial Wo Contrast  Result Date: 05/25/2021 CLINICAL DATA:  Fall yesterday with headaches and neck pain, initial encounter EXAM: CT HEAD WITHOUT CONTRAST CT MAXILLOFACIAL WITHOUT CONTRAST CT CERVICAL SPINE WITHOUT CONTRAST TECHNIQUE: Multidetector CT imaging of the head, cervical spine, and maxillofacial structures were performed using the standard protocol without intravenous contrast. Multiplanar CT image reconstructions of the cervical spine and maxillofacial structures were also generated. COMPARISON:  None. FINDINGS: CT HEAD  FINDINGS Brain: No evidence of acute infarction, hemorrhage, hydrocephalus, extra-axial collection or mass lesion/mass effect. Vascular: No hyperdense vessel or unexpected calcification. Skull: Normal. Negative for fracture or focal lesion. Other: None. CT MAXILLOFACIAL FINDINGS Osseous: No acute fracture or dislocation is noted. Patient is edentulous. Orbits: Orbits and their contents are within normal limits. Sinuses: Paranasal sinuses demonstrate mucosal thickening in the right maxillary antrum. No air-fluid level is seen. Soft tissues: Surrounding soft tissue structures demonstrate mild right periorbital swelling consistent with the recent injury. No other soft tissue abnormality is noted. CT CERVICAL SPINE FINDINGS Alignment: Mild straightening of the normal cervical lordosis is noted likely related to muscular spasm. Skull base and vertebrae: 7 cervical segments are well visualized. Vertebral body height is well maintained. Mild facet hypertrophic changes and osteophytic changes are noted. Mild motion artifact limits the examination somewhat. No acute fracture or acute facet abnormality is noted. Soft tissues  and spinal canal: Surrounding soft tissue structures are within normal limits. Upper chest: Lung apices are within normal limits. Other: None IMPRESSION: CT of the head: No acute intracranial abnormality is noted. CT of the maxillofacial bones: No acute bony abnormality is seen. Right maxillary mucosal thickening is seen. Mild right periorbital soft tissue swelling without focal hematoma. CT of the cervical spine: Degenerative change without acute abnormality. Electronically Signed   By: Inez Catalina M.D.   On: 05/25/2021 15:21    ____________________________________________   PROCEDURES  Procedure(s) performed (including Critical Care):  .1-3 Lead EKG Interpretation Performed by: Vanessa Salem, MD Authorized by: Vanessa [place], MD     Interpretation: normal     ECG rate:  90s    ECG rate  assessment: normal     Rhythm: sinus rhythm     Ectopy: none     Conduction: normal       ____________________________________________   INITIAL IMPRESSION / ASSESSMENT AND PLAN / ED COURSE  Leslie Wong was evaluated in Emergency Department on 05/25/2021 for the symptoms described in the history of present illness. She was evaluated in the context of the global COVID-19 pandemic, which necessitated consideration that the patient might be at risk for infection with the SARS-CoV-2 virus that causes COVID-19. Institutional protocols and algorithms that pertain to the evaluation of patients at risk for COVID-19 are in a state of rapid change based on information released by regulatory bodies including the CDC and federal and state organizations. These policies and algorithms were followed during the patient's care in the ED.    Patient comes in after a fall.  Difficult to exactly tell where patient's pain is given she reports pain with all of her extremities and all of her back.  Explained to patient that in triage she had a CT head, CT cervical and CT face that were negative and there were no evidence of trauma-trying to decide well-suited to get further imaging.  She reports back pain but there is no spinal tenderness and she is neuro intact low suspicion for acute lumbar thoracic fracture.  Her pain seems to be more in the flanks which seems to be more musculoskeletal in nature.  Her abdomen is soft and nontender and there is no bruising noted.  She does report some chest wall pain therefore I will get an x-ray to make sure no rib fractures.  She also seems to mostly have the pain in the left tib-fib if she does have an abrasion there although she does report pain throughout her other extremities she is able to move them with full range of motion.  Given patient's nausea and vomiting we will get some basic labs to make sure she is not dehydrated and CK level to make sure no evidence of  rhabdo.  We will give patient 1 L of fluids, Tylenol, lidocaine patch, and get EKG given possible LOC.  Patient's LFTs are slightly elevated and her CK is elevated concerning for rhabdomyolysis.  Patient already got 1 L fluid.  Discussed with patient different options including doing additional fluid and repeating it in downtrending going home but patient states that she feels too weak and nauseous and vomiting to be able to go home and is requesting admission.  Given patient CK level will discuss with the hospital team.  Patient most likely had a concussion as well as rhabdo.    Difficulty getting repeat abdominal exams given patient keeps stating that she has pain all over  her body therefore given you going to admit patient will get CT chest abdomen pelvis just to make sure no other signs of trauma that would require transfer.   1. Bilateral clusters of ground-glass densities most consistent with multi lobar pneumonia, possibly viral or atypical in etiology. Clinical correlation is recommended. 2. No acute/traumatic intra-abdominal or pelvic pathology. 3. Fatty liver. 4. Cholelithiasis. 5. Sigmoid diverticulosis. 6. Aortic Atherosclerosis (ICD10-I70.0).  Concerning for pneumonia.  Her COVID swab was negative.  Patient sats are 91 to 92% and her white count was elevated but she otherwise did not meet any SIRS criteria however I did start her on antibiotics with ceftriaxone and Doxy due to allergy to erythromycin.  I will discussed the hospital team for admission for pneumonia, rhabdo     ____________________________________________   FINAL CLINICAL IMPRESSION(S) / ED DIAGNOSES   Final diagnoses:  Traumatic rhabdomyolysis, initial encounter [facility])  Concussion with loss of consciousness of 30 minutes or less, initial encounter  Fall, initial encounter  Fall  Community acquired pneumonia, unspecified laterality      MEDICATIONS GIVEN DURING THIS VISIT:  Medications  lidocaine  (LIDODERM) 5 % 1 patch (1 patch Transdermal Patch Applied 05/25/21 1610)  sodium chloride 0.9 % bolus 1,000 mL (1,000 mLs Intravenous New Bag/Given 05/25/21 1606)  ondansetron (ZOFRAN) injection 4 mg (4 mg Intravenous Given 05/25/21 1610)  acetaminophen (TYLENOL) tablet 1,000 mg (1,000 mg Oral Given 05/25/21 1607)     ED Discharge Orders    None       Note:  This document was prepared using Dragon voice recognition software and may include unintentional dictation errors.   Vanessa Berlin, MD 05/25/21 1747    Vanessa Bronson, MD 05/25/21 [phone]

## 2021-05-25 NOTE — ED Triage Notes (Signed)
Pt comes via EMS wit c/o fall last night. Pt states she was taking shower and stepped on the shower curtain and fell.  Pt has black eye to right eye and facial trauma.

## 2021-05-26 ENCOUNTER — Ambulatory Visit: Payer: Medicare Other | Admitting: Internal Medicine

## 2021-05-26 DIAGNOSIS — I248 Other forms of acute ischemic heart disease: Secondary | ICD-10-CM | POA: Diagnosis present

## 2021-05-26 DIAGNOSIS — M797 Fibromyalgia: Secondary | ICD-10-CM | POA: Diagnosis present

## 2021-05-26 DIAGNOSIS — Y93E1 Activity, personal bathing and showering: Secondary | ICD-10-CM | POA: Diagnosis not present

## 2021-05-26 DIAGNOSIS — E876 Hypokalemia: Secondary | ICD-10-CM | POA: Diagnosis not present

## 2021-05-26 DIAGNOSIS — Y92012 Bathroom of single-family (private) house as the place of occurrence of the external cause: Secondary | ICD-10-CM | POA: Diagnosis not present

## 2021-05-26 DIAGNOSIS — Z886 Allergy status to analgesic agent status: Secondary | ICD-10-CM | POA: Diagnosis not present

## 2021-05-26 DIAGNOSIS — S060X9A Concussion with loss of consciousness of unspecified duration, initial encounter: Secondary | ICD-10-CM | POA: Diagnosis present

## 2021-05-26 DIAGNOSIS — B9689 Other specified bacterial agents as the cause of diseases classified elsewhere: Secondary | ICD-10-CM | POA: Diagnosis present

## 2021-05-26 DIAGNOSIS — J189 Pneumonia, unspecified organism: Secondary | ICD-10-CM | POA: Diagnosis present

## 2021-05-26 DIAGNOSIS — E039 Hypothyroidism, unspecified: Secondary | ICD-10-CM | POA: Diagnosis present

## 2021-05-26 DIAGNOSIS — Z79890 Hormone replacement therapy (postmenopausal): Secondary | ICD-10-CM | POA: Diagnosis not present

## 2021-05-26 DIAGNOSIS — F310 Bipolar disorder, current episode hypomanic: Secondary | ICD-10-CM | POA: Diagnosis present

## 2021-05-26 DIAGNOSIS — Z7951 Long term (current) use of inhaled steroids: Secondary | ICD-10-CM | POA: Diagnosis not present

## 2021-05-26 DIAGNOSIS — W182XXA Fall in (into) shower or empty bathtub, initial encounter: Secondary | ICD-10-CM | POA: Diagnosis present

## 2021-05-26 DIAGNOSIS — T796XXA Traumatic ischemia of muscle, initial encounter: Secondary | ICD-10-CM | POA: Diagnosis present

## 2021-05-26 DIAGNOSIS — N3941 Urge incontinence: Secondary | ICD-10-CM | POA: Diagnosis present

## 2021-05-26 DIAGNOSIS — Z20822 Contact with and (suspected) exposure to covid-19: Secondary | ICD-10-CM | POA: Diagnosis present

## 2021-05-26 DIAGNOSIS — S8012XA Contusion of left lower leg, initial encounter: Secondary | ICD-10-CM | POA: Diagnosis present

## 2021-05-26 DIAGNOSIS — S80812A Abrasion, left lower leg, initial encounter: Secondary | ICD-10-CM | POA: Diagnosis present

## 2021-05-26 DIAGNOSIS — R7401 Elevation of levels of liver transaminase levels: Secondary | ICD-10-CM | POA: Diagnosis present

## 2021-05-26 DIAGNOSIS — R7881 Bacteremia: Secondary | ICD-10-CM | POA: Diagnosis present

## 2021-05-26 DIAGNOSIS — K219 Gastro-esophageal reflux disease without esophagitis: Secondary | ICD-10-CM | POA: Diagnosis present

## 2021-05-26 DIAGNOSIS — S0011XA Contusion of right eyelid and periocular area, initial encounter: Secondary | ICD-10-CM | POA: Diagnosis present

## 2021-05-26 DIAGNOSIS — J449 Chronic obstructive pulmonary disease, unspecified: Secondary | ICD-10-CM | POA: Diagnosis present

## 2021-05-26 DIAGNOSIS — Z79899 Other long term (current) drug therapy: Secondary | ICD-10-CM | POA: Diagnosis not present

## 2021-05-26 DIAGNOSIS — J441 Chronic obstructive pulmonary disease with (acute) exacerbation: Secondary | ICD-10-CM | POA: Diagnosis present

## 2021-05-26 DIAGNOSIS — J440 Chronic obstructive pulmonary disease with acute lower respiratory infection: Secondary | ICD-10-CM | POA: Diagnosis present

## 2021-05-26 DIAGNOSIS — G20 Parkinson's disease: Secondary | ICD-10-CM | POA: Diagnosis present

## 2021-05-26 DIAGNOSIS — G4700 Insomnia, unspecified: Secondary | ICD-10-CM | POA: Diagnosis present

## 2021-05-26 LAB — CBC WITH DIFFERENTIAL/PLATELET
Abs Immature Granulocytes: 0.12 10*3/uL — ABNORMAL HIGH (ref 0.00–0.07)
Basophils Absolute: 0 10*3/uL (ref 0.0–0.1)
Basophils Relative: 0 %
Eosinophils Absolute: 0.2 10*3/uL (ref 0.0–0.5)
Eosinophils Relative: 2 %
HCT: 36 % (ref 36.0–46.0)
Hemoglobin: 12 g/dL (ref 12.0–15.0)
Immature Granulocytes: 1 %
Lymphocytes Relative: 29 %
Lymphs Abs: 4.2 10*3/uL — ABNORMAL HIGH (ref 0.7–4.0)
MCH: 31.7 pg (ref 26.0–34.0)
MCHC: 33.3 g/dL (ref 30.0–36.0)
MCV: 95.2 fL (ref 80.0–100.0)
Monocytes Absolute: 1 10*3/uL (ref 0.1–1.0)
Monocytes Relative: 7 %
Neutro Abs: 8.7 10*3/uL — ABNORMAL HIGH (ref 1.7–7.7)
Neutrophils Relative %: 61 %
Platelets: 199 10*3/uL (ref 150–400)
RBC: 3.78 MIL/uL — ABNORMAL LOW (ref 3.87–5.11)
RDW: 14.2 % (ref 11.5–15.5)
WBC: 14.3 10*3/uL — ABNORMAL HIGH (ref 4.0–10.5)
nRBC: 0 % (ref 0.0–0.2)

## 2021-05-26 LAB — BLOOD CULTURE ID PANEL (REFLEXED) - BCID2

## 2021-05-26 LAB — COMPREHENSIVE METABOLIC PANEL
ALT: 71 U/L — ABNORMAL HIGH (ref 0–44)
AST: 121 U/L — ABNORMAL HIGH (ref 15–41)
Albumin: 2.9 g/dL — ABNORMAL LOW (ref 3.5–5.0)
Alkaline Phosphatase: 66 U/L (ref 38–126)
Anion gap: 5 (ref 5–15)
BUN: 16 mg/dL (ref 8–23)
CO2: 23 mmol/L (ref 22–32)
Calcium: 8.4 mg/dL — ABNORMAL LOW (ref 8.9–10.3)
Chloride: 115 mmol/L — ABNORMAL HIGH (ref 98–111)
Creatinine, Ser: 0.79 mg/dL (ref 0.44–1.00)
GFR, Estimated: >60 mL/min (ref >60)
Glucose, Bld: 86 mg/dL (ref 70–99)
Potassium: 3.6 mmol/L (ref 3.5–5.1)
Sodium: 143 mmol/L (ref 135–145)
Total Bilirubin: 0.4 mg/dL (ref 0.3–1.2)
Total Protein: 5.9 g/dL — ABNORMAL LOW (ref 6.5–8.1)

## 2021-05-26 LAB — MAGNESIUM: Magnesium: 2.1 mg/dL (ref 1.7–2.4)

## 2021-05-26 LAB — CK: Total CK: 3737 U/L — ABNORMAL HIGH (ref 38–234)

## 2021-05-26 LAB — HIV ANTIBODY (ROUTINE TESTING W REFLEX): HIV Screen 4th Generation wRfx: NONREACTIVE

## 2021-05-26 LAB — C-REACTIVE PROTEIN: CRP: 19.6 mg/dL — ABNORMAL HIGH (ref <1.0)

## 2021-05-26 MED ORDER — ALPRAZOLAM 0.25 MG PO TABS
0.2500 mg | ORAL_TABLET | Freq: Two times a day (BID) | ORAL | Status: DC | PRN
  Administered 2021-05-26 – 2021-05-28 (×5): 0.25 mg via ORAL
  Filled 2021-05-26 (×5): qty 1

## 2021-05-26 MED ORDER — SODIUM CHLORIDE 0.9 % IV SOLN: INTRAVENOUS | Status: DC

## 2021-05-26 MED ORDER — NICOTINE 21 MG/24HR TD PT24
21.0000 mg | MEDICATED_PATCH | Freq: Every day | TRANSDERMAL | Status: DC
  Administered 2021-05-26 – 2021-05-29 (×4): 21 mg via TRANSDERMAL
  Filled 2021-05-26 (×4): qty 1

## 2021-05-26 NOTE — Plan of Care (Signed)
Startedd

## 2021-05-26 NOTE — Progress Notes (Addendum)
PROGRESS NOTE    Leslie Wong  TFT:[id number] DOB: [birth year] DOA: 05/25/2021 PCP: Tonia Ghent, MD   Assessment & Plan:   Principal Problem:   Pneumonia of both lungs due to infectious organism Active Problems:   Hypothyroidism   Bipolar affective disorder, current episode hypomanic ([place])   Gastroesophageal reflux disease with hiatal hernia   Nicotine dependence, cigarettes, uncomplicated   Rhabdomyolysis   Fall at home, initial encounter   COPD (chronic obstructive pulmonary disease) ([place])   B/l pneumonia: Continue on IV ceftriaxone, doxycycline, bronchodilators and encourage incentive spirometry. COVID19 neg. Blood cxs NGTD  Rhabdomyolysis: likely related to recent fall. Continue on IVFs. CK level is still elevated but trending down   Transaminitis: still elevated but trending down. Will continue to monitor   Fall: PT/OT consulted  Hypothyroidism: continue on home dose of synthroid  Bipolar affective disorder: continue on home dose of olanzapine   Smoker: nicotine patches to prevent w/drawal. Smoking cessation counseling   GERD: continue on PPI  Elevated troponins: likely secondary to demand ischemia. No chest pain   DVT prophylaxis: lovenox  Code Status: full  Family Communication:  Disposition Plan: likely d/c back home   Level of care: Med-Surg   Status is: Observation  The patient remains OBS appropriate and will d/c before 2 midnights.  Dispo: The patient is from: Home              Anticipated d/c is to: Home vs SNF               Patient currently is not medically stable to d/c.   Difficult to place patient : unclear    Consultants:      Procedures:    Antimicrobials: ceftriaxone, doxycycline    Subjective: Pt c/o feeling anxious   Objective: Vitals:   05/26/21 0032 05/26/21 0115 05/26/21 0444 05/26/21 0758  BP: 109/87 (!) 149/90 104/70 113/63  Pulse: 88 82 79 83  Resp: 18 16 16 19   Temp: 98.1 F (36.7 C) 98.1 F  (36.7 C) 97.8 F (36.6 C) 98.4 F (36.9 C)  TempSrc:  Oral Oral Oral  SpO2: 96% 96% 95% 95%  Weight:      Height:        Intake/Output Summary (Last 24 hours) at 05/26/2021 0806 Last data filed at 05/26/2021 0445 Gross per 24 hour  Intake --  Output 0 ml  Net 0 ml   Filed Weights   05/25/21 1453  Weight: 65.8 kg    Examination:  General exam: Appears restless and anxious   Respiratory system: diminished breath sounds b/l Cardiovascular system: S1 & S2+. No rubs, gallops or clicks.  Gastrointestinal system: Abdomen is nondistended, soft and nontender. Normal bowel sounds heard. Central nervous system: Alert and oriented. Moves all extremities  Psychiatry: Judgement and insight appear normal. Mood & affect appropriate.     Data Reviewed: I have personally reviewed following labs and imaging studies  CBC: Recent Labs  Lab 05/25/21 1545 05/26/21 0414  WBC 19.9* 14.3*  NEUTROABS 17.2* 8.7*  HGB 14.7 12.0  HCT 43.1 36.0  MCV 92.7 95.2  PLT 242 706   Basic Metabolic Panel: Recent Labs  Lab 05/25/21 1545 05/26/21 0414  NA 139 143  K 3.9 3.6  CL 103 115*  CO2 25 23  GLUCOSE 114* 86  BUN 21 16  CREATININE 0.97 0.79  CALCIUM 9.7 8.4*  MG  --  2.1   GFR: Estimated Creatinine Clearance: 60.1 mL/min (by C-G formula  based on SCr of 0.79 mg/dL). Liver Function Tests: Recent Labs  Lab 05/25/21 1545 05/26/21 0414  AST 194* 121*  ALT 101* 71*  ALKPHOS 91 66  BILITOT 0.9 0.4  PROT 7.7 5.9*  ALBUMIN 4.1 2.9*   No results for input(s): LIPASE, AMYLASE in the last 168 hours. No results for input(s): AMMONIA in the last 168 hours. Coagulation Profile: No results for input(s): INR, PROTIME in the last 168 hours. Cardiac Enzymes: Recent Labs  Lab 05/25/21 1545 05/26/21 0414  CKTOTAL 7,474* 3,737*   BNP (last 3 results) No results for input(s): PROBNP in the last 8760 hours. HbA1C: No results for input(s): HGBA1C in the last 72 hours. CBG: No results for  input(s): GLUCAP in the last 168 hours. Lipid Profile: No results for input(s): CHOL, HDL, LDLCALC, TRIG, CHOLHDL, LDLDIRECT in the last 72 hours. Thyroid Function Tests: No results for input(s): TSH, T4TOTAL, FREET4, T3FREE, THYROIDAB in the last 72 hours. Anemia Panel: No results for input(s): VITAMINB12, FOLATE, FERRITIN, TIBC, IRON, RETICCTPCT in the last 72 hours. Sepsis Labs: Recent Labs  Lab 05/25/21 1902 05/25/21 2119  PROCALCITON  --  2.22  LATICACIDVEN 1.0 0.8    Recent Results (from the past 240 hour(s))  Resp Panel by RT-PCR (Flu A&B, Covid) Nasopharyngeal Swab     Status: None   Collection Time: 05/25/21  4:54 PM   Specimen: Nasopharyngeal Swab; Nasopharyngeal(NP) swabs in vial transport medium  Result Value Ref Range Status   SARS Coronavirus 2 by RT PCR NEGATIVE NEGATIVE Final    Comment: (NOTE) SARS-CoV-2 target nucleic acids are NOT DETECTED.  The SARS-CoV-2 RNA is generally detectable in upper respiratory specimens during the acute phase of infection. The lowest concentration of SARS-CoV-2 viral copies this assay can detect is 138 copies/mL. A negative result does not preclude SARS-Cov-2 infection and should not be used as the sole basis for treatment or other patient management decisions. A negative result may occur with  improper specimen collection/handling, submission of specimen other than nasopharyngeal swab, presence of viral mutation(s) within the areas targeted by this assay, and inadequate number of viral copies(<138 copies/mL). A negative result must be combined with clinical observations, patient history, and epidemiological information. The expected result is Negative.  Fact Sheet for Patients:  EntrepreneurPulse.com.au  Fact Sheet for Healthcare Providers:  IncredibleEmployment.be  This test is no t yet approved or cleared by the [place] FDA and  has been authorized for detection and/or diagnosis of  SARS-CoV-2 by FDA under an Emergency Use Authorization (EUA). This EUA will remain  in effect (meaning this test can be used) for the duration of the COVID-19 declaration under Section 564(b)(1) of the Act, 21 U.S.C.section 360bbb-3(b)(1), unless the authorization is terminated  or revoked sooner.       Influenza A by PCR NEGATIVE NEGATIVE Final   Influenza B by PCR NEGATIVE NEGATIVE Final    Comment: (NOTE) The Xpert Xpress SARS-CoV-2/FLU/RSV plus assay is intended as an aid in the diagnosis of influenza from Nasopharyngeal swab specimens and should not be used as a sole basis for treatment. Nasal washings and aspirates are unacceptable for Xpert Xpress SARS-CoV-2/FLU/RSV testing.  Fact Sheet for Patients: EntrepreneurPulse.com.au  Fact Sheet for Healthcare Providers: IncredibleEmployment.be  This test is not yet approved or cleared by the [place] FDA and has been authorized for detection and/or diagnosis of SARS-CoV-2 by FDA under an Emergency Use Authorization (EUA). This EUA will remain in effect (meaning this test can be used) for the  duration of the COVID-19 declaration under Section 564(b)(1) of the Act, 21 U.S.C. section 360bbb-3(b)(1), unless the authorization is terminated or revoked.  Performed at [facility], [address] [place], [place] [address]   Blood culture (routine x 2)     Status: None (Preliminary result)   Collection Time: 05/25/21  7:02 PM   Specimen: BLOOD  Result Value Ref Range Status   Specimen Description BLOOD BLOOD LEFT ARM  Final   Special Requests   Final    BOTTLES DRAWN AEROBIC AND ANAEROBIC Blood Culture adequate volume   Culture   Final    NO GROWTH < 12 HOURS Performed at [facility], [address] [place], [place] [address]    Report Status PENDING  Incomplete  Blood culture (routine x 2)     Status: None (Preliminary result)   Collection Time: 05/25/21  7:02 PM    Specimen: BLOOD  Result Value Ref Range Status   Specimen Description BLOOD LEFT ANTECUBITAL  Final   Special Requests   Final    BOTTLES DRAWN AEROBIC AND ANAEROBIC Blood Culture adequate volume   Culture   Final    NO GROWTH < 12 HOURS Performed at [facility], [address] [place], [place] [address]    Report Status PENDING  Incomplete         Radiology Studies: DG Chest 2 View  Result Date: 05/25/2021 CLINICAL DATA:  Fall, back pain EXAM: CHEST - 2 VIEW COMPARISON:  12/08/2020 FINDINGS: Stable cardiomediastinal contours. Increased bilateral perihilar and bibasilar interstitial markings. No pleural effusion or pneumothorax. No acute osseous findings. IMPRESSION: Increased bilateral perihilar and bibasilar interstitial markings, findings may reflect bronchitic type lung changes versus mild edema. Electronically Signed   By: Davina Poke D.O.   On: 05/25/2021 16:37   DG Tibia/Fibula Left  Result Date: 05/25/2021 CLINICAL DATA:  [place] in the shower 2 days ago.  Bruising and pain. EXAM: LEFT TIBIA AND FIBULA - 2 VIEW COMPARISON:  None. FINDINGS: There is no evidence of fracture or other focal bone lesions. Soft tissues are unremarkable. IMPRESSION: Normal radiographs. Electronically Signed   By: Nelson Chimes M.D.   On: 05/25/2021 16:35   CT Head Wo Contrast  Result Date: 05/25/2021 CLINICAL DATA:  Fall yesterday with headaches and neck pain, initial encounter EXAM: CT HEAD WITHOUT CONTRAST CT MAXILLOFACIAL WITHOUT CONTRAST CT CERVICAL SPINE WITHOUT CONTRAST TECHNIQUE: Multidetector CT imaging of the head, cervical spine, and maxillofacial structures were performed using the standard protocol without intravenous contrast. Multiplanar CT image reconstructions of the cervical spine and maxillofacial structures were also generated. COMPARISON:  None. FINDINGS: CT HEAD FINDINGS Brain: No evidence of acute infarction, hemorrhage, hydrocephalus, extra-axial collection or mass  lesion/mass effect. Vascular: No hyperdense vessel or unexpected calcification. Skull: Normal. Negative for fracture or focal lesion. Other: None. CT MAXILLOFACIAL FINDINGS Osseous: No acute fracture or dislocation is noted. Patient is edentulous. Orbits: Orbits and their contents are within normal limits. Sinuses: Paranasal sinuses demonstrate mucosal thickening in the right maxillary antrum. No air-fluid level is seen. Soft tissues: Surrounding soft tissue structures demonstrate mild right periorbital swelling consistent with the recent injury. No other soft tissue abnormality is noted. CT CERVICAL SPINE FINDINGS Alignment: Mild straightening of the normal cervical lordosis is noted likely related to muscular spasm. Skull base and vertebrae: 7 cervical segments are well visualized. Vertebral body height is well maintained. Mild facet hypertrophic changes and osteophytic changes are noted. Mild motion artifact limits the examination somewhat.  No acute fracture or acute facet abnormality is noted. Soft tissues and spinal canal: Surrounding soft tissue structures are within normal limits. Upper chest: Lung apices are within normal limits. Other: None IMPRESSION: CT of the head: No acute intracranial abnormality is noted. CT of the maxillofacial bones: No acute bony abnormality is seen. Right maxillary mucosal thickening is seen. Mild right periorbital soft tissue swelling without focal hematoma. CT of the cervical spine: Degenerative change without acute abnormality. Electronically Signed   By: Inez Catalina M.D.   On: 05/25/2021 15:21   CT Cervical Spine Wo Contrast  Result Date: 05/25/2021 CLINICAL DATA:  Fall yesterday with headaches and neck pain, initial encounter EXAM: CT HEAD WITHOUT CONTRAST CT MAXILLOFACIAL WITHOUT CONTRAST CT CERVICAL SPINE WITHOUT CONTRAST TECHNIQUE: Multidetector CT imaging of the head, cervical spine, and maxillofacial structures were performed using the standard protocol without  intravenous contrast. Multiplanar CT image reconstructions of the cervical spine and maxillofacial structures were also generated. COMPARISON:  None. FINDINGS: CT HEAD FINDINGS Brain: No evidence of acute infarction, hemorrhage, hydrocephalus, extra-axial collection or mass lesion/mass effect. Vascular: No hyperdense vessel or unexpected calcification. Skull: Normal. Negative for fracture or focal lesion. Other: None. CT MAXILLOFACIAL FINDINGS Osseous: No acute fracture or dislocation is noted. Patient is edentulous. Orbits: Orbits and their contents are within normal limits. Sinuses: Paranasal sinuses demonstrate mucosal thickening in the right maxillary antrum. No air-fluid level is seen. Soft tissues: Surrounding soft tissue structures demonstrate mild right periorbital swelling consistent with the recent injury. No other soft tissue abnormality is noted. CT CERVICAL SPINE FINDINGS Alignment: Mild straightening of the normal cervical lordosis is noted likely related to muscular spasm. Skull base and vertebrae: 7 cervical segments are well visualized. Vertebral body height is well maintained. Mild facet hypertrophic changes and osteophytic changes are noted. Mild motion artifact limits the examination somewhat. No acute fracture or acute facet abnormality is noted. Soft tissues and spinal canal: Surrounding soft tissue structures are within normal limits. Upper chest: Lung apices are within normal limits. Other: None IMPRESSION: CT of the head: No acute intracranial abnormality is noted. CT of the maxillofacial bones: No acute bony abnormality is seen. Right maxillary mucosal thickening is seen. Mild right periorbital soft tissue swelling without focal hematoma. CT of the cervical spine: Degenerative change without acute abnormality. Electronically Signed   By: Inez Catalina M.D.   On: 05/25/2021 15:21   CT CHEST ABDOMEN PELVIS WO CONTRAST  Result Date: 05/25/2021 CLINICAL DATA:  65 year old female with  abdominal trauma. EXAM: CT CHEST, ABDOMEN AND PELVIS WITHOUT CONTRAST TECHNIQUE: Multidetector CT imaging of the chest, abdomen and pelvis was performed following the standard protocol without IV contrast. COMPARISON:  Chest CT dated 02/14/2019 and radiograph dated 05/25/2021. FINDINGS: Evaluation of this exam is limited in the absence of intravenous contrast. CT CHEST FINDINGS Cardiovascular: There is no cardiomegaly or pericardial effusion. Mild coronary vascular calcification. There is mild atherosclerotic calcification of the thoracic aorta. No aneurysmal dilatation. The central pulmonary arteries are grossly unremarkable on this noncontrast CT. Mediastinum/Nodes: No hilar or mediastinal adenopathy. The esophagus is grossly unremarkable. No mediastinal fluid collection. Lungs/Pleura: Bilateral clusters of ground-glass densities most consistent with multi lobar pneumonia, possibly viral or atypical in etiology. Clinical correlation is recommended. Areas of atelectasis/scarring noted in the right middle lobe and lingula. There is no pleural effusion or pneumothorax. Mild paraseptal emphysema. The central airways are patent. Musculoskeletal: No acute osseous pathology. CT ABDOMEN PELVIS FINDINGS No intra-abdominal free air or free fluid. Hepatobiliary: Fatty  liver. No intrahepatic biliary dilatation. Multiple small stones within gallbladder. No pericholecystic fluid or evidence of acute cholecystitis by CT. Pancreas: Unremarkable. No pancreatic ductal dilatation or surrounding inflammatory changes. Spleen: Normal in size without focal abnormality. Adrenals/Urinary Tract: The adrenal glands unremarkable. There is a 2 cm left renal posterior exophytic hypodense lesion which demonstrates fluid attenuation most likely a cyst. There is no hydronephrosis or nephrolithiasis on either side. The visualized ureters and urinary bladder appear unremarkable. Stomach/Bowel: There is sigmoid diverticulosis without active  inflammatory changes. There is moderate stool throughout the colon. There is no bowel obstruction or active inflammation. The appendix is not visualized with certainty. No inflammatory changes identified in the right lower quadrant. Vascular/Lymphatic: Mild aortoiliac atherosclerotic disease. The IVC is unremarkable. No portal venous gas. There is no adenopathy. Reproductive: Hysterectomy. No adnexal masses. Other: Small fat containing bilateral inguinal hernias. No fluid collection or bowel herniation. Musculoskeletal: Grade 1 L4-L5 anterolisthesis. No acute osseous pathology. IMPRESSION: 1. Bilateral clusters of ground-glass densities most consistent with multi lobar pneumonia, possibly viral or atypical in etiology. Clinical correlation is recommended. 2. No acute/traumatic intra-abdominal or pelvic pathology. 3. Fatty liver. 4. Cholelithiasis. 5. Sigmoid diverticulosis. 6. Aortic Atherosclerosis (ICD10-I70.0). Electronically Signed   By: Anner Crete M.D.   On: 05/25/2021 18:43   CT Maxillofacial Wo Contrast  Result Date: 05/25/2021 CLINICAL DATA:  Fall yesterday with headaches and neck pain, initial encounter EXAM: CT HEAD WITHOUT CONTRAST CT MAXILLOFACIAL WITHOUT CONTRAST CT CERVICAL SPINE WITHOUT CONTRAST TECHNIQUE: Multidetector CT imaging of the head, cervical spine, and maxillofacial structures were performed using the standard protocol without intravenous contrast. Multiplanar CT image reconstructions of the cervical spine and maxillofacial structures were also generated. COMPARISON:  None. FINDINGS: CT HEAD FINDINGS Brain: No evidence of acute infarction, hemorrhage, hydrocephalus, extra-axial collection or mass lesion/mass effect. Vascular: No hyperdense vessel or unexpected calcification. Skull: Normal. Negative for fracture or focal lesion. Other: None. CT MAXILLOFACIAL FINDINGS Osseous: No acute fracture or dislocation is noted. Patient is edentulous. Orbits: Orbits and their contents are  within normal limits. Sinuses: Paranasal sinuses demonstrate mucosal thickening in the right maxillary antrum. No air-fluid level is seen. Soft tissues: Surrounding soft tissue structures demonstrate mild right periorbital swelling consistent with the recent injury. No other soft tissue abnormality is noted. CT CERVICAL SPINE FINDINGS Alignment: Mild straightening of the normal cervical lordosis is noted likely related to muscular spasm. Skull base and vertebrae: 7 cervical segments are well visualized. Vertebral body height is well maintained. Mild facet hypertrophic changes and osteophytic changes are noted. Mild motion artifact limits the examination somewhat. No acute fracture or acute facet abnormality is noted. Soft tissues and spinal canal: Surrounding soft tissue structures are within normal limits. Upper chest: Lung apices are within normal limits. Other: None IMPRESSION: CT of the head: No acute intracranial abnormality is noted. CT of the maxillofacial bones: No acute bony abnormality is seen. Right maxillary mucosal thickening is seen. Mild right periorbital soft tissue swelling without focal hematoma. CT of the cervical spine: Degenerative change without acute abnormality. Electronically Signed   By: Inez Catalina M.D.   On: 05/25/2021 15:21        Scheduled Meds: . doxycycline  100 mg Oral Q12H  . enoxaparin (LOVENOX) injection  40 mg Subcutaneous Q24H  . levothyroxine  88 mcg Oral Q0600  . lidocaine  1 patch Transdermal Q24H  . mirtazapine  15 mg Oral QHS  . nicotine  21 mg Transdermal Daily  . OLANZapine  15 mg  Oral QHS  . pantoprazole  40 mg Oral Daily   Continuous Infusions: . sodium chloride 150 mL/hr at 05/26/21 0740  . cefTRIAXone (ROCEPHIN)  IV Stopped (05/25/21 2108)     LOS: 0 days    Time spent: 34 mins     Wyvonnia Dusky, MD [facility] Pager [phone]  If 7PM-7AM, please contact night-coverage 05/26/2021, 8:06 AM

## 2021-05-26 NOTE — Progress Notes (Signed)
Called dental office at patients request and cancelled her appt for tomorrow

## 2021-05-26 NOTE — ED Notes (Signed)
Request made for transport to the floor ?

## 2021-05-26 NOTE — H&P (Addendum)
History and Physical    Leslie Wong IOX:[id number] DOB: [birth year] DOA: 05/25/2021  PCP: Tonia Ghent, MD  Patient coming from: Home via EMS   Chief Complaint:  Chief Complaint  Patient presents with  . Fall     HPI:    65 year old female with past medical history of hypothyroidism, bipolar disorder, gastroesophageal reflux disease, fibromyalgia, nicotine dependence and COPD presenting to [facility] emergency department status post fall in the shower.  Patient explains that for the past several days she has been developing progressively worsening shortness of breath.  Shortness of breath was initially mild in intensity but progressively has become more more severe.  Shortness of breath is worse with exertion and improved with rest.  Shortness of breath associated with cough productive with yellow to green sputum.  Patient states that she has also been experiencing associated generalized weakness and lightheadedness upon standing.  Patient also complains of associated poor appetite.    Patient explains that she was attempting to take a shower 2 days ago when she suddenly got tangled up in her shower curtain, falling in the tub and striking her face.  Patient explains that ever since that fall her symptoms seem to have gotten worse.  She has been aching all over since eventually presented to [facility] emergency department via EMS for evaluation.  Upon evaluation in the emergency department, patient is been found to have a leukocytosis of 19.9.  Trauma evaluation including CT head, CT of the facial bones and CT of the cervical spine has been negative.  Patient been found to be suffering from mild rhabdomyolysis with CK of 7474.  Urine drug screen is positive for THC, TCAs and opioids.  Due to patient's ongoing complaints of severe generalized body pain the ER provider ended up performing a CT of the chest abdomen pelvis revealing bilateral groundglass infiltrates concerning for multilobar  pneumonia.  COVID-19 testing was negative.  Patient was initiated on intravenous ceftriaxone and doxycycline.  The hospitalist group was then called to assess the patient for admission to the hospital.    Review of Systems:   Review of Systems  Constitutional: Positive for malaise/fatigue.  Musculoskeletal: Positive for falls and myalgias.  Neurological: Positive for dizziness and weakness.  All other systems reviewed and are negative.   Past Medical History:  Diagnosis Date  . Back pain   . Cancer ([place])    skin  . Depression    BAD  . Gastroparesis   . GERD (gastroesophageal reflux disease)   . Hypothyroidism   . Insomnia   . Migraines   . Parkinson disease ([place])    per [place] [facility], dx'd 2019  . Recurrent cold sores   . Shoulder bursitis   . Urge incontinence     Past Surgical History:  Procedure Laterality Date  . ABDOMINAL HYSTERECTOMY  1999   total  . APPENDECTOMY    . ESOPHAGOGASTRODUODENOSCOPY  01/2006   negative except small hiatal hernia  . ESOPHAGOGASTRODUODENOSCOPY  02/24/2015   See report  . LAPAROSCOPY     for endometriosis  . OVARIAN CYST REMOVAL  1990   unilateral  . TONSILLECTOMY AND ADENOIDECTOMY       reports that she has been smoking cigarettes. She has a 10.63 pack-year smoking history. She has quit using smokeless tobacco. She reports that she does not drink alcohol and does not use drugs.  Allergies  Allergen Reactions  . Doxepin Other (See Comments)    Pt reports "that made me have seizures"  .  Valproic Acid Shortness Of Breath  . Aspirin     REACTION: UNSPECIFIED  . Atorvastatin Other (See Comments)    Tongue swelling, legs cramping  . Baclofen     REACTION: Throat swells up  . Buprenorphine     Vomiting, intolerant  . Duloxetine Other (See Comments)    Vision loss, weight loss  . Erythromycin     REACTION: UNSPECIFIED  . Gabapentin     REACTION: throat swells up  . Metoclopramide Hcl     REACTION: states "messed me up"  .  Nortriptyline     Other reaction(s): Other (See Comments) Vision issues  . Nsaids Other (See Comments)    Stomach problems  . Nucynta Er [Tapentadol Hcl Er]     Intolerant, see note from 07/24/12.    Gean [place]ch[place] [Tapentadol]     AMS  . Prednisone     GI intolance  . Pregabalin   . Seroquel [Quetiapine Fumerate] Other (See Comments)    Loss control, felt like on another planet  . Sulfa Antibiotics Nausea And Vomiting  . Topamax Other (See Comments)    Blisters in mouth and tongue  . Vicodin [Hydrocodone-Acetaminophen]     Nausea, thrush and constipation    Family History  Problem Relation Age of Onset  . Hypertension Mother   . Arthritis Mother   . Diabetes Mother   . Stroke Mother   . Cancer Father        lung  . Colon cancer Neg Hx   . Breast cancer Neg Hx      Prior to Admission medications   Medication Sig Start Date End Date Taking? Authorizing Provider  albuterol (VENTOLIN HFA) 108 (90 Base) MCG/ACT inhaler Inhale 2 puffs into the lungs every 6 (six) hours as needed for wheezing or shortness of breath. 04/26/21   Tonia Ghent, MD  budesonide-formoterol [facility]) 80-4.5 MCG/ACT inhaler Inhale 2 puffs into the lungs 2 (two) times daily. Rinse after use. 04/26/21   Tonia Ghent, MD  hydrOXYzine (ATARAX/VISTARIL) 50 MG tablet Take 1 tablet (50 mg total) by mouth 3 (three) times daily as needed for anxiety. 12/29/20   [name], [name], MD  levothyroxine (SYNTHROID) 88 MCG tablet TAKE 1 TABLET(88 MCG) BY MOUTH DAILY AT 6 AM 04/26/21   Tonia Ghent, MD  mineral oil-hydrophilic petrolatum (AQUAPHOR) ointment Apply topically daily as needed for dry skin. 12/29/20   [name], [name], MD  mirtazapine (REMERON) 15 MG tablet Take 1 tablet (15 mg total) by mouth at bedtime. 12/29/20   [name], [name], MD  OLANZapine (ZYPREXA) 15 MG tablet Take 1 tablet (15 mg total) by mouth at bedtime. 12/29/20   [name], [name], MD  ondansetron (ZOFRAN) 4 MG tablet Take 1 tablet (4 mg total) by mouth  every 8 (eight) hours as needed for nausea or vomiting. 01/08/21   Tonia Ghent, MD  pantoprazole (PROTONIX) 40 MG tablet Take 1 tablet (40 mg total) by mouth daily. 12/30/20   [name], [name], MD  pregabalin (LYRICA) 150 MG capsule Take 150 mg by mouth 3 (three) times daily. 04/14/21   [provider]  tiZANidine (ZANAFLEX) 4 MG tablet Take 4 mg by mouth 3 (three) times daily as needed. 04/14/21   [provider]  valACYclovir (VALTREX) 500 MG tablet Take 1 tablet (500 mg total) by mouth daily. 12/30/20   [name], [name], MD    Physical Exam: Vitals:   05/25/21 1821 05/25/21 1830 05/26/21 0032 05/26/21  0115  BP: 130/77 123/69 109/87 (!) 149/90  Pulse: 98 73 88 82  Resp:  18 18 16   Temp:   98.1 F (36.7 C) 98.1 F (36.7 C)  TempSrc:    Oral  SpO2: 92% 91% 96% 96%  Weight:      Height:        Constitutional: Awake alert and oriented x3, no associated distress.   Skin: no rashes, no lesions, good skin turgor noted. Eyes: Pupils are equally reactive to light.  No evidence of scleral icterus or conjunctival pallor.  ENMT: Moist mucous membranes noted.  Posterior pharynx clear of any exudate or lesions.   Neck: normal, supple, no masses, no thyromegaly.  No evidence of jugular venous distension.   Respiratory: Intermittent expiratory wheezing noted.  No evidence of rales.  Normal respiratory effort. No accessory muscle use.  Cardiovascular: Regular rate and rhythm, no murmurs / rubs / gallops. No extremity edema. 2+ pedal pulses. No carotid bruits.  Chest:   Generalized tenderness without crepitus or deformity.   Back:   Generalized tenderness without crepitus or deformity. Abdomen: Abdomen is soft with generalized abdominal wall tenderness.  No evidence of intra-abdominal masses.  Positive bowel sounds noted in all quadrants.   Musculoskeletal: Generalized tenderness of all extremities.  No joint deformity upper and lower extremities. Good ROM, no contractures. Normal  muscle tone.  Neurologic: CN 2-12 grossly intact. Sensation intact.  Patient moving all 4 extremities spontaneously.  Patient is following all commands.  Patient is responsive to verbal stimuli.   Psychiatric: Patient exhibits normal mood with odd affect .  Patient seems to possess insight as to their current situation.     Labs on Admission: I have personally reviewed following labs and imaging studies -   CBC: Recent Labs  Lab 05/25/21 1545  WBC 19.9*  NEUTROABS 17.2*  HGB 14.7  HCT 43.1  MCV 92.7  PLT 283   Basic Metabolic Panel: Recent Labs  Lab 05/25/21 1545  NA 139  K 3.9  CL 103  CO2 25  GLUCOSE 114*  BUN 21  CREATININE 0.97  CALCIUM 9.7   GFR: Estimated Creatinine Clearance: 49.6 mL/min (by C-G formula based on SCr of 0.97 mg/dL). Liver Function Tests: Recent Labs  Lab 05/25/21 1545  AST 194*  ALT 101*  ALKPHOS 91  BILITOT 0.9  PROT 7.7  ALBUMIN 4.1   No results for input(s): LIPASE, AMYLASE in the last 168 hours. No results for input(s): AMMONIA in the last 168 hours. Coagulation Profile: No results for input(s): INR, PROTIME in the last 168 hours. Cardiac Enzymes: Recent Labs  Lab 05/25/21 1545  CKTOTAL 7,474*   BNP (last 3 results) No results for input(s): PROBNP in the last 8760 hours. HbA1C: No results for input(s): HGBA1C in the last 72 hours. CBG: No results for input(s): GLUCAP in the last 168 hours. Lipid Profile: No results for input(s): CHOL, HDL, LDLCALC, TRIG, CHOLHDL, LDLDIRECT in the last 72 hours. Thyroid Function Tests: No results for input(s): TSH, T4TOTAL, FREET4, T3FREE, THYROIDAB in the last 72 hours. Anemia Panel: No results for input(s): VITAMINB12, FOLATE, FERRITIN, TIBC, IRON, RETICCTPCT in the last 72 hours. Urine analysis:    Component Value Date/Time   COLORURINE YELLOW (A) 05/25/2021 1811   APPEARANCEUR CLEAR (A) 05/25/2021 1811   LABSPEC 1.019 05/25/2021 1811   PHURINE 5.0 05/25/2021 1811   GLUCOSEU  NEGATIVE 05/25/2021 1811   HGBUR NEGATIVE 05/25/2021 1811   BILIRUBINUR NEGATIVE 05/25/2021 1811   KETONESUR  5 (A) 05/25/2021 1811   PROTEINUR 30 (A) 05/25/2021 1811   NITRITE NEGATIVE 05/25/2021 1811   LEUKOCYTESUR NEGATIVE 05/25/2021 1811    Radiological Exams on Admission - Personally Reviewed: DG Chest 2 View  Result Date: 05/25/2021 CLINICAL DATA:  Fall, back pain EXAM: CHEST - 2 VIEW COMPARISON:  12/08/2020 FINDINGS: Stable cardiomediastinal contours. Increased bilateral perihilar and bibasilar interstitial markings. No pleural effusion or pneumothorax. No acute osseous findings. IMPRESSION: Increased bilateral perihilar and bibasilar interstitial markings, findings may reflect bronchitic type lung changes versus mild edema. Electronically Signed   By: Davina Poke D.O.   On: 05/25/2021 16:37   DG Tibia/Fibula Left  Result Date: 05/25/2021 CLINICAL DATA:  [place] in the shower 2 days ago.  Bruising and pain. EXAM: LEFT TIBIA AND FIBULA - 2 VIEW COMPARISON:  None. FINDINGS: There is no evidence of fracture or other focal bone lesions. Soft tissues are unremarkable. IMPRESSION: Normal radiographs. Electronically Signed   By: Nelson Chimes M.D.   On: 05/25/2021 16:35   CT Head Wo Contrast  Result Date: 05/25/2021 CLINICAL DATA:  Fall yesterday with headaches and neck pain, initial encounter EXAM: CT HEAD WITHOUT CONTRAST CT MAXILLOFACIAL WITHOUT CONTRAST CT CERVICAL SPINE WITHOUT CONTRAST TECHNIQUE: Multidetector CT imaging of the head, cervical spine, and maxillofacial structures were performed using the standard protocol without intravenous contrast. Multiplanar CT image reconstructions of the cervical spine and maxillofacial structures were also generated. COMPARISON:  None. FINDINGS: CT HEAD FINDINGS Brain: No evidence of acute infarction, hemorrhage, hydrocephalus, extra-axial collection or mass lesion/mass effect. Vascular: No hyperdense vessel or unexpected calcification. Skull: Normal.  Negative for fracture or focal lesion. Other: None. CT MAXILLOFACIAL FINDINGS Osseous: No acute fracture or dislocation is noted. Patient is edentulous. Orbits: Orbits and their contents are within normal limits. Sinuses: Paranasal sinuses demonstrate mucosal thickening in the right maxillary antrum. No air-fluid level is seen. Soft tissues: Surrounding soft tissue structures demonstrate mild right periorbital swelling consistent with the recent injury. No other soft tissue abnormality is noted. CT CERVICAL SPINE FINDINGS Alignment: Mild straightening of the normal cervical lordosis is noted likely related to muscular spasm. Skull base and vertebrae: 7 cervical segments are well visualized. Vertebral body height is well maintained. Mild facet hypertrophic changes and osteophytic changes are noted. Mild motion artifact limits the examination somewhat. No acute fracture or acute facet abnormality is noted. Soft tissues and spinal canal: Surrounding soft tissue structures are within normal limits. Upper chest: Lung apices are within normal limits. Other: None IMPRESSION: CT of the head: No acute intracranial abnormality is noted. CT of the maxillofacial bones: No acute bony abnormality is seen. Right maxillary mucosal thickening is seen. Mild right periorbital soft tissue swelling without focal hematoma. CT of the cervical spine: Degenerative change without acute abnormality. Electronically Signed   By: Inez Catalina M.D.   On: 05/25/2021 15:21   CT Cervical Spine Wo Contrast  Result Date: 05/25/2021 CLINICAL DATA:  Fall yesterday with headaches and neck pain, initial encounter EXAM: CT HEAD WITHOUT CONTRAST CT MAXILLOFACIAL WITHOUT CONTRAST CT CERVICAL SPINE WITHOUT CONTRAST TECHNIQUE: Multidetector CT imaging of the head, cervical spine, and maxillofacial structures were performed using the standard protocol without intravenous contrast. Multiplanar CT image reconstructions of the cervical spine and maxillofacial  structures were also generated. COMPARISON:  None. FINDINGS: CT HEAD FINDINGS Brain: No evidence of acute infarction, hemorrhage, hydrocephalus, extra-axial collection or mass lesion/mass effect. Vascular: No hyperdense vessel or unexpected calcification. Skull: Normal. Negative for fracture or focal lesion. Other:  None. CT MAXILLOFACIAL FINDINGS Osseous: No acute fracture or dislocation is noted. Patient is edentulous. Orbits: Orbits and their contents are within normal limits. Sinuses: Paranasal sinuses demonstrate mucosal thickening in the right maxillary antrum. No air-fluid level is seen. Soft tissues: Surrounding soft tissue structures demonstrate mild right periorbital swelling consistent with the recent injury. No other soft tissue abnormality is noted. CT CERVICAL SPINE FINDINGS Alignment: Mild straightening of the normal cervical lordosis is noted likely related to muscular spasm. Skull base and vertebrae: 7 cervical segments are well visualized. Vertebral body height is well maintained. Mild facet hypertrophic changes and osteophytic changes are noted. Mild motion artifact limits the examination somewhat. No acute fracture or acute facet abnormality is noted. Soft tissues and spinal canal: Surrounding soft tissue structures are within normal limits. Upper chest: Lung apices are within normal limits. Other: None IMPRESSION: CT of the head: No acute intracranial abnormality is noted. CT of the maxillofacial bones: No acute bony abnormality is seen. Right maxillary mucosal thickening is seen. Mild right periorbital soft tissue swelling without focal hematoma. CT of the cervical spine: Degenerative change without acute abnormality. Electronically Signed   By: Inez Catalina M.D.   On: 05/25/2021 15:21   CT CHEST ABDOMEN PELVIS WO CONTRAST  Result Date: 05/25/2021 CLINICAL DATA:  65 year old female with abdominal trauma. EXAM: CT CHEST, ABDOMEN AND PELVIS WITHOUT CONTRAST TECHNIQUE: Multidetector CT imaging  of the chest, abdomen and pelvis was performed following the standard protocol without IV contrast. COMPARISON:  Chest CT dated 02/14/2019 and radiograph dated 05/25/2021. FINDINGS: Evaluation of this exam is limited in the absence of intravenous contrast. CT CHEST FINDINGS Cardiovascular: There is no cardiomegaly or pericardial effusion. Mild coronary vascular calcification. There is mild atherosclerotic calcification of the thoracic aorta. No aneurysmal dilatation. The central pulmonary arteries are grossly unremarkable on this noncontrast CT. Mediastinum/Nodes: No hilar or mediastinal adenopathy. The esophagus is grossly unremarkable. No mediastinal fluid collection. Lungs/Pleura: Bilateral clusters of ground-glass densities most consistent with multi lobar pneumonia, possibly viral or atypical in etiology. Clinical correlation is recommended. Areas of atelectasis/scarring noted in the right middle lobe and lingula. There is no pleural effusion or pneumothorax. Mild paraseptal emphysema. The central airways are patent. Musculoskeletal: No acute osseous pathology. CT ABDOMEN PELVIS FINDINGS No intra-abdominal free air or free fluid. Hepatobiliary: Fatty liver. No intrahepatic biliary dilatation. Multiple small stones within gallbladder. No pericholecystic fluid or evidence of acute cholecystitis by CT. Pancreas: Unremarkable. No pancreatic ductal dilatation or surrounding inflammatory changes. Spleen: Normal in size without focal abnormality. Adrenals/Urinary Tract: The adrenal glands unremarkable. There is a 2 cm left renal posterior exophytic hypodense lesion which demonstrates fluid attenuation most likely a cyst. There is no hydronephrosis or nephrolithiasis on either side. The visualized ureters and urinary bladder appear unremarkable. Stomach/Bowel: There is sigmoid diverticulosis without active inflammatory changes. There is moderate stool throughout the colon. There is no bowel obstruction or active  inflammation. The appendix is not visualized with certainty. No inflammatory changes identified in the right lower quadrant. Vascular/Lymphatic: Mild aortoiliac atherosclerotic disease. The IVC is unremarkable. No portal venous gas. There is no adenopathy. Reproductive: Hysterectomy. No adnexal masses. Other: Small fat containing bilateral inguinal hernias. No fluid collection or bowel herniation. Musculoskeletal: Grade 1 L4-L5 anterolisthesis. No acute osseous pathology. IMPRESSION: 1. Bilateral clusters of ground-glass densities most consistent with multi lobar pneumonia, possibly viral or atypical in etiology. Clinical correlation is recommended. 2. No acute/traumatic intra-abdominal or pelvic pathology. 3. Fatty liver. 4. Cholelithiasis. 5. Sigmoid diverticulosis. 6.  Aortic Atherosclerosis (ICD10-I70.0). Electronically Signed   By: Anner Crete M.D.   On: 05/25/2021 18:43   CT Maxillofacial Wo Contrast  Result Date: 05/25/2021 CLINICAL DATA:  Fall yesterday with headaches and neck pain, initial encounter EXAM: CT HEAD WITHOUT CONTRAST CT MAXILLOFACIAL WITHOUT CONTRAST CT CERVICAL SPINE WITHOUT CONTRAST TECHNIQUE: Multidetector CT imaging of the head, cervical spine, and maxillofacial structures were performed using the standard protocol without intravenous contrast. Multiplanar CT image reconstructions of the cervical spine and maxillofacial structures were also generated. COMPARISON:  None. FINDINGS: CT HEAD FINDINGS Brain: No evidence of acute infarction, hemorrhage, hydrocephalus, extra-axial collection or mass lesion/mass effect. Vascular: No hyperdense vessel or unexpected calcification. Skull: Normal. Negative for fracture or focal lesion. Other: None. CT MAXILLOFACIAL FINDINGS Osseous: No acute fracture or dislocation is noted. Patient is edentulous. Orbits: Orbits and their contents are within normal limits. Sinuses: Paranasal sinuses demonstrate mucosal thickening in the right maxillary antrum.  No air-fluid level is seen. Soft tissues: Surrounding soft tissue structures demonstrate mild right periorbital swelling consistent with the recent injury. No other soft tissue abnormality is noted. CT CERVICAL SPINE FINDINGS Alignment: Mild straightening of the normal cervical lordosis is noted likely related to muscular spasm. Skull base and vertebrae: 7 cervical segments are well visualized. Vertebral body height is well maintained. Mild facet hypertrophic changes and osteophytic changes are noted. Mild motion artifact limits the examination somewhat. No acute fracture or acute facet abnormality is noted. Soft tissues and spinal canal: Surrounding soft tissue structures are within normal limits. Upper chest: Lung apices are within normal limits. Other: None IMPRESSION: CT of the head: No acute intracranial abnormality is noted. CT of the maxillofacial bones: No acute bony abnormality is seen. Right maxillary mucosal thickening is seen. Mild right periorbital soft tissue swelling without focal hematoma. CT of the cervical spine: Degenerative change without acute abnormality. Electronically Signed   By: Inez Catalina M.D.   On: 05/25/2021 15:21    EKG: Personally reviewed.  Rhythm is normal sinus rhythm with heart rate of 81 bpm.  No dynamic ST segment changes appreciated.  Assessment/Plan Principal Problem:   Pneumonia of both lungs due to infectious organism   Patient complaining of several day history of progressively worsening dyspnea on exertion and cough productive of green to yellow sputum although I have not witnessed any of the sputum production here  Chest imaging revealing patchy bilateral groundglass infiltrates concerning for possible infectious process  COVID-19 testing negative  Blood cultures obtained  ER provider has initiated intravenous ceftriaxone and doxycycline which will be continued for now  While patient does exhibit a leukocytosis this is seemingly chronic when looking  at previous laboratory values.  We will provide as needed supplemental oxygen as necessary  Obtaining CRP, procalcitonin  Active Problems:   Rhabdomyolysis   Evidence of mild rhabdomyolysis with initial CK of 7474  Likely related to recent fall  Hydrating patient with intravenous isotonic fluids   Performing serial CK levels  Performing serial chemistries to monitor renal function and electrolytes  Note associated elevation of AST and ALT which will be monitored    Fall at home, initial encounter   Patient reports fall at home while in the shower 2 days ago with evidence of facial trauma  Trauma survey negative for fractures or other significant injury in the emergency department  PT evaluation ordered    Hypothyroidism   Continue home regimen of Synthroid    Bipolar affective disorder, current episode hypomanic ([place])   Continue  home regimen of psychotropic medication regimen    Nicotine dependence, cigarettes, uncomplicated   Counseling patient daily on cessation  Patient requesting nicotine patches    Gastroesophageal reflux disease with hiatal hernia   Continue home regimen of daily PPI  Elevated troponin not due to myocardial infarction   Slightly elevated troponin of 18 with follow-up troponin of 16 in the absence of chest pain.  Unlikely to be secondary to plaque rupture    Code Status:  Full code Family Communication: deferred   Status is: Observation  The patient remains OBS appropriate and will d/c before 2 midnights.  Dispo: The patient is from: Home              Anticipated d/c is to: Home              Patient currently is not medically stable to d/c.   Difficult to place patient No        Vernelle Emerald MD [facility] Pager [phone]  If 7PM-7AM, please contact night-coverage [email or web address] Use universal [place] password for that web site. If you do not have the password, please call the hospital  operator.  05/26/2021, 3:41 AM

## 2021-05-26 NOTE — Evaluation (Addendum)
Physical Therapy Evaluation Patient Details Name: Leslie Wong MRN: [id number] DOB: 12-29-1955 Today's Date: 05/26/2021   History of Present Illness  Pt admitted for pneumonia of B lungs with complaints of fall in shower now with black eye and N/V. History includes bipolar, GERD, fibromyalgia and COPD.  Clinical Impression  Pt is a pleasant 65 year old female who was admitted for B pneumonia of lungs. Pt performs transfers and ambulation with cga. Pt demonstrates deficits with endurance/mobility/safety. Slightly unsteady, may benefit from RW. Would benefit from skilled PT to address above deficits and promote optimal return to PLOF. Recommend transition to [place] upon discharge from acute hospitalization. All mobility performed on RA with slight SOB symptoms noted.  SaO2 on room air at rest = 92% SaO2 on room air while ambulating = 87% SaO2 on 2 liters of O2 while ambulating = 93%      Follow Up Recommendations Home health PT;Supervision for mobility/OOB    Equipment Recommendations  Rolling walker with 5" wheels    Recommendations for Other Services       Precautions / Restrictions Precautions Precautions: Fall Restrictions Weight Bearing Restrictions: No      Mobility  Bed Mobility               General bed mobility comments: not performed as received on BSC    Transfers Overall transfer level: Needs assistance Equipment used: None Transfers: Sit to/from Stand Sit to Stand: Min guard         General transfer comment: safe technique pushing from seated surface. Once standing, upright posture ntoed  Ambulation/Gait Ambulation/Gait assistance: Min guard Gait Distance (Feet): 50 Feet Assistive device: None Gait Pattern/deviations: Step-through pattern     General Gait Details: ambulated in room and short distance in hallway. No AD used. Impulsive and needs cues for obstacle avoidance and IV management  Stairs            Wheelchair Mobility     Modified Rankin (Stroke Patients Only)       Balance Overall balance assessment: Needs assistance Sitting-balance support: Feet supported;Bilateral upper extremity supported Sitting balance-Leahy Scale: Good     Standing balance support: No upper extremity supported Standing balance-Leahy Scale: Fair                               Pertinent Vitals/Pain Pain Assessment: No/denies pain    Home Living Family/patient expects to be discharged to:: Private residence Living Arrangements: Alone   Type of Home: House Home Access: Stairs to enter Entrance Stairs-Rails: None Entrance Stairs-Number of Steps: 1 Home Layout: One level Home Equipment: None      Prior Function Level of Independence: Independent         Comments: reports no history of falls.     Hand Dominance        Extremity/Trunk Assessment   Upper Extremity Assessment Upper Extremity Assessment: Overall WFL for tasks assessed    Lower Extremity Assessment Lower Extremity Assessment: Generalized weakness (B LE grossly 4/5)       Communication   Communication: No difficulties  Cognition Arousal/Alertness: Awake/alert Behavior During Therapy: Anxious Overall Cognitive Status: Within Functional Limits for tasks assessed                                 General Comments: impulsive and anxious about cancelling upcoming dentist appointment for tomorrow.  General Comments      Exercises Other Exercises Other Exercises: pt ambulated over to [facility]. Able to perfom hygiene with supervision and safe technique.   Assessment/Plan    PT Assessment Patient needs continued PT services  PT Problem List Decreased strength;Decreased mobility;Decreased balance       PT Treatment Interventions Gait training;DME instruction;Balance training    PT Goals (Current goals can be found in the Care Plan section)  Acute Rehab PT Goals Patient Stated Goal: to go home PT Goal  Formulation: With patient Time For Goal Achievement: 06/09/21 Potential to Achieve Goals: Good    Frequency Min 2X/week   Barriers to discharge        Co-evaluation               AM-PAC PT "6 Clicks" Mobility  Outcome Measure Help needed turning from your back to your side while in a flat bed without using bedrails?: None Help needed moving from lying on your back to sitting on the side of a flat bed without using bedrails?: None Help needed moving to and from a bed to a chair (including a wheelchair)?: None Help needed standing up from a chair using your arms (e.g., wheelchair or bedside chair)?: None Help needed to walk in hospital room?: None Help needed climbing 3-5 steps with a railing? : None 6 Click Score: 24    End of Session Equipment Utilized During Treatment: Gait belt;Oxygen Activity Tolerance: Patient tolerated treatment well Patient left: in chair;with chair alarm set Nurse Communication: Mobility status PT Visit Diagnosis: Unsteadiness on feet (R26.81);Muscle weakness (generalized) (M62.81)    Time: 8938-1017 PT Time Calculation (min) (ACUTE ONLY): 39 min   Charges:   PT Evaluation $PT Eval Low Complexity: 1 Low PT Treatments $Therapeutic Activity: 23-37 mins        Greggory Stallion, PT, DPT [phone]   Leslie Wong 05/26/2021, 3:11 PM

## 2021-05-26 NOTE — TOC Progression Note (Signed)
Transition of Care [facility]) - Progression Note    Patient Details  Name: Leslie Wong MRN: [id number] Date of Birth: [birth year]  Transition of [facility] CM/SW [place], RN Phone Number: 05/26/2021, 10:25 AM  Clinical Narrative:   Patient lives at home alone, states family can provide some help if need post discharge.  Patient does not have [place] or use any DME at this time.  Patient has ecchymosis to right eye, states it was from a fall in the shower.  Patient does not have concerns about returning home at this time.   TOC contact information given, TOC to follow through discharge.     Expected Discharge Plan: Home/Self Care Barriers to Discharge: Continued Medical Work up  Expected Discharge Plan and Services Expected Discharge Plan: Home/Self Care       Living arrangements for the past 2 months: Single Family Home                                       Social Determinants of Health (SDOH) Interventions    Readmission Risk Interventions No flowsheet data found.

## 2021-05-26 NOTE — Consult Note (Signed)
PHARMACY - PHYSICIAN COMMUNICATION CRITICAL VALUE ALERT - BLOOD CULTURE IDENTIFICATION (BCID)  Saren Corkern is an 65 y.o. female who presented to [facility] on 05/25/2021 with a chief complaint of progressively worsening shortness of breath.  Assessment:  1/4 bottles(anaerobic only) GNR, Enterobacterales.   Name of physician (or Provider) Contacted: Eppie Gibson  Current antibiotics: Doxycycline, Rocephin  Changes to prescribed antibiotics recommended:  Will continue with current abx therapy. The Rocephin will continue to cover the Enterobacterales and the Doxycycline will help treat the patient's pneumonia. Will await susceptibilities and de-escalate once able.  Results for orders placed or performed during the hospital encounter of 05/25/21  Blood Culture ID Panel (Reflexed) (Collected: 05/25/2021  7:02 PM)  Result Value Ref Range   Enterococcus faecalis NOT DETECTED NOT DETECTED   Enterococcus Faecium NOT DETECTED NOT DETECTED   Listeria monocytogenes NOT DETECTED NOT DETECTED   Staphylococcus species NOT DETECTED NOT DETECTED   Staphylococcus aureus (BCID) NOT DETECTED NOT DETECTED   Staphylococcus epidermidis NOT DETECTED NOT DETECTED   Staphylococcus lugdunensis NOT DETECTED NOT DETECTED   Streptococcus species NOT DETECTED NOT DETECTED   Streptococcus agalactiae NOT DETECTED NOT DETECTED   Streptococcus pneumoniae NOT DETECTED NOT DETECTED   Streptococcus pyogenes NOT DETECTED NOT DETECTED   A.calcoaceticus-baumannii NOT DETECTED NOT DETECTED   Bacteroides fragilis NOT DETECTED NOT DETECTED   Enterobacterales DETECTED (A) NOT DETECTED   Enterobacter cloacae complex NOT DETECTED NOT DETECTED   Escherichia coli NOT DETECTED NOT DETECTED   Klebsiella aerogenes NOT DETECTED NOT DETECTED   Klebsiella oxytoca NOT DETECTED NOT DETECTED   Klebsiella pneumoniae NOT DETECTED NOT DETECTED   Proteus species NOT DETECTED NOT DETECTED   Salmonella species NOT DETECTED NOT  DETECTED   Serratia marcescens NOT DETECTED NOT DETECTED   Haemophilus influenzae NOT DETECTED NOT DETECTED   Neisseria meningitidis NOT DETECTED NOT DETECTED   Pseudomonas aeruginosa NOT DETECTED NOT DETECTED   Stenotrophomonas maltophilia NOT DETECTED NOT DETECTED   Candida albicans NOT DETECTED NOT DETECTED   Candida auris NOT DETECTED NOT DETECTED   Candida glabrata NOT DETECTED NOT DETECTED   Candida krusei NOT DETECTED NOT DETECTED   Candida parapsilosis NOT DETECTED NOT DETECTED   Candida tropicalis NOT DETECTED NOT DETECTED   Cryptococcus neoformans/gattii NOT DETECTED NOT DETECTED   CTX-M ESBL NOT DETECTED NOT DETECTED   Carbapenem resistance IMP NOT DETECTED NOT DETECTED   Carbapenem resistance KPC NOT DETECTED NOT DETECTED   Carbapenem resistance NDM NOT DETECTED NOT DETECTED   Carbapenem resist OXA 48 LIKE NOT DETECTED NOT DETECTED   Carbapenem resistance VIM NOT DETECTED NOT DETECTED    Corine Solorio A Renarda Mullinix 05/26/2021  4:59 PM

## 2021-05-26 NOTE — Progress Notes (Signed)
Bed alarm at all times. Pt impulsive and doesn't not use call light no matter how many times I  Have told her. Anxiety better since xanax. Will continue to monitor.

## 2021-05-26 NOTE — Progress Notes (Signed)
Dr. Jimmye Norman notified of worsening lungs sounds now cackles and wheezing. Orders received to decrease fluids to 86ml/hr

## 2021-05-27 DIAGNOSIS — J189 Pneumonia, unspecified organism: Secondary | ICD-10-CM | POA: Diagnosis not present

## 2021-05-27 DIAGNOSIS — R7881 Bacteremia: Secondary | ICD-10-CM | POA: Diagnosis not present

## 2021-05-27 DIAGNOSIS — R7401 Elevation of levels of liver transaminase levels: Secondary | ICD-10-CM

## 2021-05-27 LAB — COMPREHENSIVE METABOLIC PANEL
ALT: 63 U/L — ABNORMAL HIGH (ref 0–44)
AST: 74 U/L — ABNORMAL HIGH (ref 15–41)
Albumin: 2.8 g/dL — ABNORMAL LOW (ref 3.5–5.0)
Alkaline Phosphatase: 69 U/L (ref 38–126)
Anion gap: 5 (ref 5–15)
BUN: 11 mg/dL (ref 8–23)
CO2: 23 mmol/L (ref 22–32)
Calcium: 8.6 mg/dL — ABNORMAL LOW (ref 8.9–10.3)
Chloride: 114 mmol/L — ABNORMAL HIGH (ref 98–111)
Creatinine, Ser: 0.7 mg/dL (ref 0.44–1.00)
GFR, Estimated: >60 mL/min (ref >60)
Glucose, Bld: 89 mg/dL (ref 70–99)
Potassium: 3.8 mmol/L (ref 3.5–5.1)
Sodium: 142 mmol/L (ref 135–145)
Total Bilirubin: 0.5 mg/dL (ref 0.3–1.2)
Total Protein: 5.6 g/dL — ABNORMAL LOW (ref 6.5–8.1)

## 2021-05-27 LAB — CBC
HCT: 35.9 % — ABNORMAL LOW (ref 36.0–46.0)
Hemoglobin: 12.2 g/dL (ref 12.0–15.0)
MCH: 32.1 pg (ref 26.0–34.0)
MCHC: 34 g/dL (ref 30.0–36.0)
MCV: 94.5 fL (ref 80.0–100.0)
Platelets: 208 10*3/uL (ref 150–400)
RBC: 3.8 MIL/uL — ABNORMAL LOW (ref 3.87–5.11)
RDW: 14.3 % (ref 11.5–15.5)
WBC: 9.9 10*3/uL (ref 4.0–10.5)
nRBC: 0 % (ref 0.0–0.2)

## 2021-05-27 LAB — CK: Total CK: 1900 U/L — ABNORMAL HIGH (ref 38–234)

## 2021-05-27 MED ORDER — SCOPOLAMINE 1 MG/3DAYS TD PT72
1.0000 | MEDICATED_PATCH | TRANSDERMAL | Status: DC
  Administered 2021-05-27: 1.5 mg via TRANSDERMAL
  Filled 2021-05-27: qty 1

## 2021-05-27 MED ORDER — MORPHINE SULFATE (PF) 2 MG/ML IV SOLN
1.0000 mg | INTRAVENOUS | Status: DC | PRN
Start: 2021-05-27 — End: 2021-05-29
  Filled 2021-05-27: qty 1

## 2021-05-27 MED ORDER — ONDANSETRON 8 MG PO TBDP
8.0000 mg | ORAL_TABLET | Freq: Three times a day (TID) | ORAL | Status: DC | PRN
  Filled 2021-05-27: qty 1

## 2021-05-27 MED ORDER — OXYCODONE-ACETAMINOPHEN 7.5-325 MG PO TABS
1.0000 | ORAL_TABLET | Freq: Four times a day (QID) | ORAL | Status: DC | PRN
  Administered 2021-05-27 – 2021-05-29 (×6): 1 via ORAL
  Filled 2021-05-27 (×7): qty 1

## 2021-05-27 NOTE — Progress Notes (Signed)
PROGRESS NOTE    Leslie Wong  WUJ:[id number] DOB: 05/04/1956 DOA: 05/25/2021 PCP: Tonia Ghent, MD   Assessment & Plan:   Principal Problem:   Pneumonia of both lungs due to infectious organism Active Problems:   Hypothyroidism   Bipolar affective disorder, current episode hypomanic ([place])   Gastroesophageal reflux disease with hiatal hernia   Nicotine dependence, cigarettes, uncomplicated   Rhabdomyolysis   Fall at home, initial encounter   COPD (chronic obstructive pulmonary disease) ([place])   Pneumonia   B/l pneumonia: continue on doxycycline, IV ceftriaxone, bronchodilators & encourage incentive spirometry. COVID19 neg   Bacteremia: blood cxs growing gram neg rods, sens pending. Continue on IV ceftriaxone   Rhabdomyolysis: likely related to recent fall. Continue on IVFs. CK is still elevated but continues to trend down. Cr is WNL   Transaminitis: AST, ALT are still elevated but continue to trend down   Fall: PT/OT recs HH. HH orders have been placed  Hypothyroidism: continue on home dose of synthroid   Bipolar affective disorder: continue on home dose of olanzapine   Smoker: nicotine patch to prevent w/drawal. Smoking cessation counseling   GERD: continue on PPI   Elevated troponins: likely secondary to demand ischemia. No chest pain    DVT prophylaxis: lovenox  Code Status: full  Family Communication: discussed pt's care w/ pt's daughter, Marylou Flesher, and answered her questions Disposition Plan: likely d/c back home w/ home health   Level of care: Med-Surg   Status is: Inpatient  Remains inpatient appropriate because:IV treatments appropriate due to intensity of illness or inability to take PO and Inpatient level of care appropriate due to severity of illness   Dispo: The patient is from: Home              Anticipated d/c is to: Home              Patient currently is not medically stable to d/c.   Difficult to place patient:  unclear     Consultants:      Procedures:    Antimicrobials: ceftriaxone, doxycycline    Subjective: Pt c/o pain   Objective: Vitals:   05/26/21 2040 05/27/21 0021 05/27/21 0507 05/27/21 0508  BP: 133/80 134/77 127/76 127/76  Pulse: 72 80  78  Resp: 18 16 18 18   Temp: (!) 97.5 F (36.4 C) 99 F (37.2 C) 97.7 F (36.5 C) 97.7 F (36.5 C)  TempSrc: Oral Oral Oral Oral  SpO2: 100% 98%  98%  Weight:      Height:        Intake/Output Summary (Last 24 hours) at 05/27/2021 0730 Last data filed at 05/27/2021 0600 Gross per 24 hour  Intake 2835.45 ml  Output --  Net 2835.45 ml   Filed Weights   05/25/21 1453  Weight: 65.8 kg    Examination:  General exam: Appears comfortable  Respiratory system: decreased breath sounds b/l. No wheezes Cardiovascular system: S1/S2+. No rubs or clicks  Gastrointestinal system: Abd is soft, NT, ND & hypoactive bowel sounds  Central nervous system: Alert and oriented. Moves all extremities  Psychiatry: judgement and insight appear normal. Flat mood and affect     Data Reviewed: I have personally reviewed following labs and imaging studies  CBC: Recent Labs  Lab 05/25/21 1545 05/26/21 0414 05/27/21 0238  WBC 19.9* 14.3* 9.9  NEUTROABS 17.2* 8.7*  --   HGB 14.7 12.0 12.2  HCT 43.1 36.0 35.9*  MCV 92.7 95.2 94.5  PLT 242 199 208  Basic Metabolic Panel: Recent Labs  Lab 05/25/21 1545 05/26/21 0414 05/27/21 0238  NA 139 143 142  K 3.9 3.6 3.8  CL 103 115* 114*  CO2 25 23 23   GLUCOSE 114* 86 89  BUN 21 16 11   CREATININE 0.97 0.79 0.70  CALCIUM 9.7 8.4* 8.6*  MG  --  2.1  --    GFR: Estimated Creatinine Clearance: 60.1 mL/min (by C-G formula based on SCr of 0.7 mg/dL). Liver Function Tests: Recent Labs  Lab 05/25/21 1545 05/26/21 0414 05/27/21 0238  AST 194* 121* 74*  ALT 101* 71* 63*  ALKPHOS 91 66 69  BILITOT 0.9 0.4 0.5  PROT 7.7 5.9* 5.6*  ALBUMIN 4.1 2.9* 2.8*   No results for input(s): LIPASE,  AMYLASE in the last 168 hours. No results for input(s): AMMONIA in the last 168 hours. Coagulation Profile: No results for input(s): INR, PROTIME in the last 168 hours. Cardiac Enzymes: Recent Labs  Lab 05/25/21 1545 05/26/21 0414 05/27/21 0238  CKTOTAL 7,474* 3,737* 1,900*   BNP (last 3 results) No results for input(s): PROBNP in the last 8760 hours. HbA1C: No results for input(s): HGBA1C in the last 72 hours. CBG: No results for input(s): GLUCAP in the last 168 hours. Lipid Profile: No results for input(s): CHOL, HDL, LDLCALC, TRIG, CHOLHDL, LDLDIRECT in the last 72 hours. Thyroid Function Tests: No results for input(s): TSH, T4TOTAL, FREET4, T3FREE, THYROIDAB in the last 72 hours. Anemia Panel: No results for input(s): VITAMINB12, FOLATE, FERRITIN, TIBC, IRON, RETICCTPCT in the last 72 hours. Sepsis Labs: Recent Labs  Lab 05/25/21 1902 05/25/21 2119  PROCALCITON  --  2.22  LATICACIDVEN 1.0 0.8    Recent Results (from the past 240 hour(s))  Resp Panel by RT-PCR (Flu A&B, Covid) Nasopharyngeal Swab     Status: None   Collection Time: 05/25/21  4:54 PM   Specimen: Nasopharyngeal Swab; Nasopharyngeal(NP) swabs in vial transport medium  Result Value Ref Range Status   SARS Coronavirus 2 by RT PCR NEGATIVE NEGATIVE Final    Comment: (NOTE) SARS-CoV-2 target nucleic acids are NOT DETECTED.  The SARS-CoV-2 RNA is generally detectable in upper respiratory specimens during the acute phase of infection. The lowest concentration of SARS-CoV-2 viral copies this assay can detect is 138 copies/mL. A negative result does not preclude SARS-Cov-2 infection and should not be used as the sole basis for treatment or other patient management decisions. A negative result may occur with  improper specimen collection/handling, submission of specimen other than nasopharyngeal swab, presence of viral mutation(s) within the areas targeted by this assay, and inadequate number of  viral copies(<138 copies/mL). A negative result must be combined with clinical observations, patient history, and epidemiological information. The expected result is Negative.  Fact Sheet for Patients:  EntrepreneurPulse.com.au  Fact Sheet for Healthcare Providers:  IncredibleEmployment.be  This test is no t yet approved or cleared by the [place] FDA and  has been authorized for detection and/or diagnosis of SARS-CoV-2 by FDA under an Emergency Use Authorization (EUA). This EUA will remain  in effect (meaning this test can be used) for the duration of the COVID-19 declaration under Section 564(b)(1) of the Act, 21 U.S.C.section 360bbb-3(b)(1), unless the authorization is terminated  or revoked sooner.       Influenza A by PCR NEGATIVE NEGATIVE Final   Influenza B by PCR NEGATIVE NEGATIVE Final    Comment: (NOTE) The Xpert Xpress SARS-CoV-2/FLU/RSV plus assay is intended as an aid in the diagnosis of influenza from  Nasopharyngeal swab specimens and should not be used as a sole basis for treatment. Nasal washings and aspirates are unacceptable for Xpert Xpress SARS-CoV-2/FLU/RSV testing.  Fact Sheet for Patients: EntrepreneurPulse.com.au  Fact Sheet for Healthcare Providers: IncredibleEmployment.be  This test is not yet approved or cleared by the [place] FDA and has been authorized for detection and/or diagnosis of SARS-CoV-2 by FDA under an Emergency Use Authorization (EUA). This EUA will remain in effect (meaning this test can be used) for the duration of the COVID-19 declaration under Section 564(b)(1) of the Act, 21 U.S.C. section 360bbb-3(b)(1), unless the authorization is terminated or revoked.  Performed at [facility], [address] [place], [place] [address]   Blood culture (routine x 2)     Status: None (Preliminary result)   Collection Time: 05/25/21  7:02 PM    Specimen: BLOOD  Result Value Ref Range Status   Specimen Description BLOOD BLOOD LEFT ARM  Final   Special Requests   Final    BOTTLES DRAWN AEROBIC AND ANAEROBIC Blood Culture adequate volume   Culture   Final    NO GROWTH < 12 HOURS Performed at [facility], [address] [place], [place] [address]    Report Status PENDING  Incomplete  Blood culture (routine x 2)     Status: None (Preliminary result)   Collection Time: 05/25/21  7:02 PM   Specimen: BLOOD  Result Value Ref Range Status   Specimen Description BLOOD LEFT ANTECUBITAL  Final   Special Requests   Final    BOTTLES DRAWN AEROBIC AND ANAEROBIC Blood Culture adequate volume   Culture  Setup Time   Final    ANAEROBIC BOTTLE ONLY GRAM NEGATIVE RODS Organism ID to follow CRITICAL RESULT CALLED TO, READ BACK BY AND VERIFIED WITH: Rito Ehrlich @1651  05/26/21 MJU Performed at [facility], [address] [place], [place] [address]    Culture GRAM NEGATIVE RODS  Final   Report Status PENDING  Incomplete  Blood Culture ID Panel (Reflexed)     Status: Abnormal   Collection Time: 05/25/21  7:02 PM  Result Value Ref Range Status   Enterococcus faecalis NOT DETECTED NOT DETECTED Final   Enterococcus Faecium NOT DETECTED NOT DETECTED Final   Listeria monocytogenes NOT DETECTED NOT DETECTED Final   Staphylococcus species NOT DETECTED NOT DETECTED Final   Staphylococcus aureus (BCID) NOT DETECTED NOT DETECTED Final   Staphylococcus epidermidis NOT DETECTED NOT DETECTED Final   Staphylococcus lugdunensis NOT DETECTED NOT DETECTED Final   Streptococcus species NOT DETECTED NOT DETECTED Final   Streptococcus agalactiae NOT DETECTED NOT DETECTED Final   Streptococcus pneumoniae NOT DETECTED NOT DETECTED Final   Streptococcus pyogenes NOT DETECTED NOT DETECTED Final   A.calcoaceticus-baumannii NOT DETECTED NOT DETECTED Final   Bacteroides fragilis NOT DETECTED NOT DETECTED Final   Enterobacterales DETECTED (A)  NOT DETECTED Final    Comment: Enterobacterales represent a large order of gram negative bacteria, not a single organism. Refer to culture for further identification. CRITICAL RESULT CALLED TO, READ BACK BY AND VERIFIED WITH: WALID NAZARI @1651  05/26/21 MJU    Enterobacter cloacae complex NOT DETECTED NOT DETECTED Final   Escherichia coli NOT DETECTED NOT DETECTED Final   Klebsiella aerogenes NOT DETECTED NOT DETECTED Final   Klebsiella oxytoca NOT DETECTED NOT DETECTED Final   Klebsiella pneumoniae NOT DETECTED NOT DETECTED Final   Proteus species NOT DETECTED NOT DETECTED Final   Salmonella species NOT DETECTED NOT DETECTED Final   Serratia marcescens  NOT DETECTED NOT DETECTED Final   Haemophilus influenzae NOT DETECTED NOT DETECTED Final   Neisseria meningitidis NOT DETECTED NOT DETECTED Final   Pseudomonas aeruginosa NOT DETECTED NOT DETECTED Final   Stenotrophomonas maltophilia NOT DETECTED NOT DETECTED Final   Candida albicans NOT DETECTED NOT DETECTED Final   Candida auris NOT DETECTED NOT DETECTED Final   Candida glabrata NOT DETECTED NOT DETECTED Final   Candida krusei NOT DETECTED NOT DETECTED Final   Candida parapsilosis NOT DETECTED NOT DETECTED Final   Candida tropicalis NOT DETECTED NOT DETECTED Final   Cryptococcus neoformans/gattii NOT DETECTED NOT DETECTED Final   CTX-M ESBL NOT DETECTED NOT DETECTED Final   Carbapenem resistance IMP NOT DETECTED NOT DETECTED Final   Carbapenem resistance KPC NOT DETECTED NOT DETECTED Final   Carbapenem resistance NDM NOT DETECTED NOT DETECTED Final   Carbapenem resist OXA 48 LIKE NOT DETECTED NOT DETECTED Final   Carbapenem resistance VIM NOT DETECTED NOT DETECTED Final    Comment: Performed at [facility], [address] [place], [place] [address]         Radiology Studies: DG Chest 2 View  Result Date: 05/25/2021 CLINICAL DATA:  Fall, back pain EXAM: CHEST - 2 VIEW COMPARISON:  12/08/2020 FINDINGS: Stable  cardiomediastinal contours. Increased bilateral perihilar and bibasilar interstitial markings. No pleural effusion or pneumothorax. No acute osseous findings. IMPRESSION: Increased bilateral perihilar and bibasilar interstitial markings, findings may reflect bronchitic type lung changes versus mild edema. Electronically Signed   By: Davina Poke D.O.   On: 05/25/2021 16:37   DG Tibia/Fibula Left  Result Date: 05/25/2021 CLINICAL DATA:  [place] in the shower 2 days ago.  Bruising and pain. EXAM: LEFT TIBIA AND FIBULA - 2 VIEW COMPARISON:  None. FINDINGS: There is no evidence of fracture or other focal bone lesions. Soft tissues are unremarkable. IMPRESSION: Normal radiographs. Electronically Signed   By: Nelson Chimes M.D.   On: 05/25/2021 16:35   CT Head Wo Contrast  Result Date: 05/25/2021 CLINICAL DATA:  Fall yesterday with headaches and neck pain, initial encounter EXAM: CT HEAD WITHOUT CONTRAST CT MAXILLOFACIAL WITHOUT CONTRAST CT CERVICAL SPINE WITHOUT CONTRAST TECHNIQUE: Multidetector CT imaging of the head, cervical spine, and maxillofacial structures were performed using the standard protocol without intravenous contrast. Multiplanar CT image reconstructions of the cervical spine and maxillofacial structures were also generated. COMPARISON:  None. FINDINGS: CT HEAD FINDINGS Brain: No evidence of acute infarction, hemorrhage, hydrocephalus, extra-axial collection or mass lesion/mass effect. Vascular: No hyperdense vessel or unexpected calcification. Skull: Normal. Negative for fracture or focal lesion. Other: None. CT MAXILLOFACIAL FINDINGS Osseous: No acute fracture or dislocation is noted. Patient is edentulous. Orbits: Orbits and their contents are within normal limits. Sinuses: Paranasal sinuses demonstrate mucosal thickening in the right maxillary antrum. No air-fluid level is seen. Soft tissues: Surrounding soft tissue structures demonstrate mild right periorbital swelling consistent with the  recent injury. No other soft tissue abnormality is noted. CT CERVICAL SPINE FINDINGS Alignment: Mild straightening of the normal cervical lordosis is noted likely related to muscular spasm. Skull base and vertebrae: 7 cervical segments are well visualized. Vertebral body height is well maintained. Mild facet hypertrophic changes and osteophytic changes are noted. Mild motion artifact limits the examination somewhat. No acute fracture or acute facet abnormality is noted. Soft tissues and spinal canal: Surrounding soft tissue structures are within normal limits. Upper chest: Lung apices are within normal limits. Other: None IMPRESSION: CT of the head: No acute intracranial abnormality is noted. CT  of the maxillofacial bones: No acute bony abnormality is seen. Right maxillary mucosal thickening is seen. Mild right periorbital soft tissue swelling without focal hematoma. CT of the cervical spine: Degenerative change without acute abnormality. Electronically Signed   By: Inez Catalina M.D.   On: 05/25/2021 15:21   CT Cervical Spine Wo Contrast  Result Date: 05/25/2021 CLINICAL DATA:  Fall yesterday with headaches and neck pain, initial encounter EXAM: CT HEAD WITHOUT CONTRAST CT MAXILLOFACIAL WITHOUT CONTRAST CT CERVICAL SPINE WITHOUT CONTRAST TECHNIQUE: Multidetector CT imaging of the head, cervical spine, and maxillofacial structures were performed using the standard protocol without intravenous contrast. Multiplanar CT image reconstructions of the cervical spine and maxillofacial structures were also generated. COMPARISON:  None. FINDINGS: CT HEAD FINDINGS Brain: No evidence of acute infarction, hemorrhage, hydrocephalus, extra-axial collection or mass lesion/mass effect. Vascular: No hyperdense vessel or unexpected calcification. Skull: Normal. Negative for fracture or focal lesion. Other: None. CT MAXILLOFACIAL FINDINGS Osseous: No acute fracture or dislocation is noted. Patient is edentulous. Orbits: Orbits and  their contents are within normal limits. Sinuses: Paranasal sinuses demonstrate mucosal thickening in the right maxillary antrum. No air-fluid level is seen. Soft tissues: Surrounding soft tissue structures demonstrate mild right periorbital swelling consistent with the recent injury. No other soft tissue abnormality is noted. CT CERVICAL SPINE FINDINGS Alignment: Mild straightening of the normal cervical lordosis is noted likely related to muscular spasm. Skull base and vertebrae: 7 cervical segments are well visualized. Vertebral body height is well maintained. Mild facet hypertrophic changes and osteophytic changes are noted. Mild motion artifact limits the examination somewhat. No acute fracture or acute facet abnormality is noted. Soft tissues and spinal canal: Surrounding soft tissue structures are within normal limits. Upper chest: Lung apices are within normal limits. Other: None IMPRESSION: CT of the head: No acute intracranial abnormality is noted. CT of the maxillofacial bones: No acute bony abnormality is seen. Right maxillary mucosal thickening is seen. Mild right periorbital soft tissue swelling without focal hematoma. CT of the cervical spine: Degenerative change without acute abnormality. Electronically Signed   By: Inez Catalina M.D.   On: 05/25/2021 15:21   CT CHEST ABDOMEN PELVIS WO CONTRAST  Result Date: 05/25/2021 CLINICAL DATA:  65 year old female with abdominal trauma. EXAM: CT CHEST, ABDOMEN AND PELVIS WITHOUT CONTRAST TECHNIQUE: Multidetector CT imaging of the chest, abdomen and pelvis was performed following the standard protocol without IV contrast. COMPARISON:  Chest CT dated 02/14/2019 and radiograph dated 05/25/2021. FINDINGS: Evaluation of this exam is limited in the absence of intravenous contrast. CT CHEST FINDINGS Cardiovascular: There is no cardiomegaly or pericardial effusion. Mild coronary vascular calcification. There is mild atherosclerotic calcification of the thoracic  aorta. No aneurysmal dilatation. The central pulmonary arteries are grossly unremarkable on this noncontrast CT. Mediastinum/Nodes: No hilar or mediastinal adenopathy. The esophagus is grossly unremarkable. No mediastinal fluid collection. Lungs/Pleura: Bilateral clusters of ground-glass densities most consistent with multi lobar pneumonia, possibly viral or atypical in etiology. Clinical correlation is recommended. Areas of atelectasis/scarring noted in the right middle lobe and lingula. There is no pleural effusion or pneumothorax. Mild paraseptal emphysema. The central airways are patent. Musculoskeletal: No acute osseous pathology. CT ABDOMEN PELVIS FINDINGS No intra-abdominal free air or free fluid. Hepatobiliary: Fatty liver. No intrahepatic biliary dilatation. Multiple small stones within gallbladder. No pericholecystic fluid or evidence of acute cholecystitis by CT. Pancreas: Unremarkable. No pancreatic ductal dilatation or surrounding inflammatory changes. Spleen: Normal in size without focal abnormality. Adrenals/Urinary Tract: The adrenal glands unremarkable. There  is a 2 cm left renal posterior exophytic hypodense lesion which demonstrates fluid attenuation most likely a cyst. There is no hydronephrosis or nephrolithiasis on either side. The visualized ureters and urinary bladder appear unremarkable. Stomach/Bowel: There is sigmoid diverticulosis without active inflammatory changes. There is moderate stool throughout the colon. There is no bowel obstruction or active inflammation. The appendix is not visualized with certainty. No inflammatory changes identified in the right lower quadrant. Vascular/Lymphatic: Mild aortoiliac atherosclerotic disease. The IVC is unremarkable. No portal venous gas. There is no adenopathy. Reproductive: Hysterectomy. No adnexal masses. Other: Small fat containing bilateral inguinal hernias. No fluid collection or bowel herniation. Musculoskeletal: Grade 1 L4-L5  anterolisthesis. No acute osseous pathology. IMPRESSION: 1. Bilateral clusters of ground-glass densities most consistent with multi lobar pneumonia, possibly viral or atypical in etiology. Clinical correlation is recommended. 2. No acute/traumatic intra-abdominal or pelvic pathology. 3. Fatty liver. 4. Cholelithiasis. 5. Sigmoid diverticulosis. 6. Aortic Atherosclerosis (ICD10-I70.0). Electronically Signed   By: Anner Crete M.D.   On: 05/25/2021 18:43   CT Maxillofacial Wo Contrast  Result Date: 05/25/2021 CLINICAL DATA:  Fall yesterday with headaches and neck pain, initial encounter EXAM: CT HEAD WITHOUT CONTRAST CT MAXILLOFACIAL WITHOUT CONTRAST CT CERVICAL SPINE WITHOUT CONTRAST TECHNIQUE: Multidetector CT imaging of the head, cervical spine, and maxillofacial structures were performed using the standard protocol without intravenous contrast. Multiplanar CT image reconstructions of the cervical spine and maxillofacial structures were also generated. COMPARISON:  None. FINDINGS: CT HEAD FINDINGS Brain: No evidence of acute infarction, hemorrhage, hydrocephalus, extra-axial collection or mass lesion/mass effect. Vascular: No hyperdense vessel or unexpected calcification. Skull: Normal. Negative for fracture or focal lesion. Other: None. CT MAXILLOFACIAL FINDINGS Osseous: No acute fracture or dislocation is noted. Patient is edentulous. Orbits: Orbits and their contents are within normal limits. Sinuses: Paranasal sinuses demonstrate mucosal thickening in the right maxillary antrum. No air-fluid level is seen. Soft tissues: Surrounding soft tissue structures demonstrate mild right periorbital swelling consistent with the recent injury. No other soft tissue abnormality is noted. CT CERVICAL SPINE FINDINGS Alignment: Mild straightening of the normal cervical lordosis is noted likely related to muscular spasm. Skull base and vertebrae: 7 cervical segments are well visualized. Vertebral body height is well  maintained. Mild facet hypertrophic changes and osteophytic changes are noted. Mild motion artifact limits the examination somewhat. No acute fracture or acute facet abnormality is noted. Soft tissues and spinal canal: Surrounding soft tissue structures are within normal limits. Upper chest: Lung apices are within normal limits. Other: None IMPRESSION: CT of the head: No acute intracranial abnormality is noted. CT of the maxillofacial bones: No acute bony abnormality is seen. Right maxillary mucosal thickening is seen. Mild right periorbital soft tissue swelling without focal hematoma. CT of the cervical spine: Degenerative change without acute abnormality. Electronically Signed   By: Inez Catalina M.D.   On: 05/25/2021 15:21        Scheduled Meds: . doxycycline  100 mg Oral Q12H  . enoxaparin (LOVENOX) injection  40 mg Subcutaneous Q24H  . levothyroxine  88 mcg Oral Q0600  . lidocaine  1 patch Transdermal Q24H  . mirtazapine  15 mg Oral QHS  . nicotine  21 mg Transdermal Daily  . OLANZapine  15 mg Oral QHS  . pantoprazole  40 mg Oral Daily   Continuous Infusions: . sodium chloride 75 mL/hr at 05/27/21 0600  . cefTRIAXone (ROCEPHIN)  IV 2 g (05/26/21 1819)     LOS: 1 day    Time spent:  30 mins     Wyvonnia Dusky, MD [facility] Pager [phone]  If 7PM-7AM, please contact night-coverage 05/27/2021, 7:30 AM

## 2021-05-27 NOTE — TOC Progression Note (Signed)
Transition of Care [facility]) - Progression Note    Patient Details  Name: Franci Oshana MRN: [id number] Date of Birth: [birth year]  Transition of Care [facility]) CM/SW [place], RN Phone Number: 05/27/2021, 2:29 PM  Clinical Narrative:   Gilford Rile delivered to patient's room, [facility], spoke to [place], will assume PT/OT home health for patient, can begin Saturday.  Patient amenable to plan.    Expected Discharge Plan: Home/Self Care Barriers to Discharge: Continued Medical Work up  Expected Discharge Plan and Services Expected Discharge Plan: Home/Self Care       Living arrangements for the past 2 months: Single Family Home                                       Social Determinants of Health (SDOH) Interventions    Readmission Risk Interventions No flowsheet data found.

## 2021-05-28 DIAGNOSIS — J189 Pneumonia, unspecified organism: Secondary | ICD-10-CM | POA: Diagnosis not present

## 2021-05-28 DIAGNOSIS — E876 Hypokalemia: Secondary | ICD-10-CM

## 2021-05-28 DIAGNOSIS — R7881 Bacteremia: Secondary | ICD-10-CM | POA: Diagnosis not present

## 2021-05-28 LAB — COMPREHENSIVE METABOLIC PANEL
ALT: 60 U/L — ABNORMAL HIGH (ref 0–44)
AST: 57 U/L — ABNORMAL HIGH (ref 15–41)
Albumin: 3.3 g/dL — ABNORMAL LOW (ref 3.5–5.0)
Alkaline Phosphatase: 73 U/L (ref 38–126)
Anion gap: 9 (ref 5–15)
BUN: 9 mg/dL (ref 8–23)
CO2: 23 mmol/L (ref 22–32)
Calcium: 9.5 mg/dL (ref 8.9–10.3)
Chloride: 112 mmol/L — ABNORMAL HIGH (ref 98–111)
Creatinine, Ser: 0.56 mg/dL (ref 0.44–1.00)
GFR, Estimated: >60 mL/min (ref >60)
Glucose, Bld: 94 mg/dL (ref 70–99)
Potassium: 3.2 mmol/L — ABNORMAL LOW (ref 3.5–5.1)
Sodium: 144 mmol/L (ref 135–145)
Total Bilirubin: 0.7 mg/dL (ref 0.3–1.2)
Total Protein: 6.5 g/dL (ref 6.5–8.1)

## 2021-05-28 LAB — CBC
HCT: 38 % (ref 36.0–46.0)
Hemoglobin: 12.9 g/dL (ref 12.0–15.0)
MCH: 31.5 pg (ref 26.0–34.0)
MCHC: 33.9 g/dL (ref 30.0–36.0)
MCV: 92.9 fL (ref 80.0–100.0)
Platelets: 235 10*3/uL (ref 150–400)
RBC: 4.09 MIL/uL (ref 3.87–5.11)
RDW: 13.9 % (ref 11.5–15.5)
WBC: 10.4 10*3/uL (ref 4.0–10.5)
nRBC: 0 % (ref 0.0–0.2)

## 2021-05-28 LAB — URINE CULTURE

## 2021-05-28 LAB — CK: Total CK: 955 U/L — ABNORMAL HIGH (ref 38–234)

## 2021-05-28 MED ORDER — MELATONIN 5 MG PO TABS
2.5000 mg | ORAL_TABLET | Freq: Every day | ORAL | Status: DC
  Filled 2021-05-28: qty 1

## 2021-05-28 MED ORDER — POTASSIUM CHLORIDE CRYS ER 20 MEQ PO TBCR
40.0000 meq | EXTENDED_RELEASE_TABLET | Freq: Once | ORAL | Status: AC
  Administered 2021-05-28: 40 meq via ORAL
  Filled 2021-05-28: qty 2

## 2021-05-28 NOTE — Progress Notes (Signed)
PROGRESS NOTE    Leslie Wong  KVQ:[id number] DOB: 1956-06-02 DOA: 05/25/2021 PCP: Tonia Ghent, MD   Assessment & Plan:   Principal Problem:   Pneumonia of both lungs due to infectious organism Active Problems:   Hypothyroidism   Bipolar affective disorder, current episode hypomanic ([place])   Gastroesophageal reflux disease with hiatal hernia   Nicotine dependence, cigarettes, uncomplicated   Rhabdomyolysis   Fall at home, initial encounter   COPD (chronic obstructive pulmonary disease) ([place])   Pneumonia   B/l pneumonia: continue on doxycycline, IV ceftriaxone, bronchodilators & encourage incentive spirometry. COVID19 neg   Bacteremia: etiology unclear. Blood cxs growing gram neg rods, sens pending still. Continue on IV ceftriaxone. Repeat blood cxs ordered  Rhabdomyolysis: likely related to recent fall. D/c IVFs. CK is elevated still but continues to trend down daily. Cr is WNL   Hypokalemia: KCl repleated. Will continue to monitor   Transaminitis: still elevated but trending down   Fall: PT/OT recs HH.   Hypothyroidism: continue on home dose of levothyroxine   Bipolar affective disorder: continue on home dose of olanzapine   Smoker: nicotine patch to prevent w/drawal. Smoking cessation counseling   GERD: continue on pantoprazole   Elevated troponins: likely secondary to demand ischemia. No chest pain    DVT prophylaxis: lovenox  Code Status: full  Family Communication: discussed pt's care w/ pt's daughter, Marylou Flesher, and answered her questions Disposition Plan: likely d/c back home w/ home health   Level of care: Med-Surg   Status is: Inpatient  Remains inpatient appropriate because:IV treatments appropriate due to intensity of illness or inability to take PO and Inpatient level of care appropriate due to severity of illness   Dispo: The patient is from: Home              Anticipated d/c is to: Home              Patient currently is not  medically stable to d/c.   Difficult to place patient: unclear     Consultants:      Procedures:    Antimicrobials: ceftriaxone, doxycycline    Subjective: Pt c/o not sleeping well   Objective: Vitals:   05/27/21 1135 05/27/21 1544 05/27/21 2035 05/28/21 0403  BP: 130/82 (!) 124/93 (!) 161/91 (!) 149/78  Pulse:  72 78 81  Resp:  18 18 16   Temp:  97.7 F (36.5 C) 98.4 F (36.9 C) 97.8 F (36.6 C)  TempSrc:  Oral Oral Oral  SpO2:  97% 98% 97%  Weight:      Height:        Intake/Output Summary (Last 24 hours) at 05/28/2021 0721 Last data filed at 05/27/2021 1849 Gross per 24 hour  Intake 480 ml  Output 300 ml  Net 180 ml   Filed Weights   05/25/21 1453  Weight: 65.8 kg    Examination:  General exam: Appears calm & comfortable  Respiratory system: diminished breath sounds b/l  Cardiovascular system: S1 & S2+. No clicks or rubs   Gastrointestinal system: Abd is soft, NT, ND & normal bowel sounds  Central nervous system: Alert and oriented. Moves all extremities  Psychiatry: judgement and insight appear normal. Flat mood and affect    Data Reviewed: I have personally reviewed following labs and imaging studies  CBC: Recent Labs  Lab 05/25/21 1545 05/26/21 0414 05/27/21 0238 05/28/21 0517  WBC 19.9* 14.3* 9.9 10.4  NEUTROABS 17.2* 8.7*  --   --   HGB 14.7  12.0 12.2 12.9  HCT 43.1 36.0 35.9* 38.0  MCV 92.7 95.2 94.5 92.9  PLT 242 199 208 938   Basic Metabolic Panel: Recent Labs  Lab 05/25/21 1545 05/26/21 0414 05/27/21 0238 05/28/21 0517  NA 139 143 142 144  K 3.9 3.6 3.8 3.2*  CL 103 115* 114* 112*  CO2 25 23 23 23   GLUCOSE 114* 86 89 94  BUN 21 16 11 9   CREATININE 0.97 0.79 0.70 0.56  CALCIUM 9.7 8.4* 8.6* 9.5  MG  --  2.1  --   --    GFR: Estimated Creatinine Clearance: 60.1 mL/min (by C-G formula based on SCr of 0.56 mg/dL). Liver Function Tests: Recent Labs  Lab 05/25/21 1545 05/26/21 0414 05/27/21 0238 05/28/21 0517  AST  194* 121* 74* 57*  ALT 101* 71* 63* 60*  ALKPHOS 91 66 69 73  BILITOT 0.9 0.4 0.5 0.7  PROT 7.7 5.9* 5.6* 6.5  ALBUMIN 4.1 2.9* 2.8* 3.3*   No results for input(s): LIPASE, AMYLASE in the last 168 hours. No results for input(s): AMMONIA in the last 168 hours. Coagulation Profile: No results for input(s): INR, PROTIME in the last 168 hours. Cardiac Enzymes: Recent Labs  Lab 05/25/21 1545 05/26/21 0414 05/27/21 0238 05/28/21 0517  CKTOTAL 7,474* 3,737* 1,900* 955*   BNP (last 3 results) No results for input(s): PROBNP in the last 8760 hours. HbA1C: No results for input(s): HGBA1C in the last 72 hours. CBG: No results for input(s): GLUCAP in the last 168 hours. Lipid Profile: No results for input(s): CHOL, HDL, LDLCALC, TRIG, CHOLHDL, LDLDIRECT in the last 72 hours. Thyroid Function Tests: No results for input(s): TSH, T4TOTAL, FREET4, T3FREE, THYROIDAB in the last 72 hours. Anemia Panel: No results for input(s): VITAMINB12, FOLATE, FERRITIN, TIBC, IRON, RETICCTPCT in the last 72 hours. Sepsis Labs: Recent Labs  Lab 05/25/21 1902 05/25/21 2119  PROCALCITON  --  2.22  LATICACIDVEN 1.0 0.8    Recent Results (from the past 240 hour(s))  Resp Panel by RT-PCR (Flu A&B, Covid) Nasopharyngeal Swab     Status: None   Collection Time: 05/25/21  4:54 PM   Specimen: Nasopharyngeal Swab; Nasopharyngeal(NP) swabs in vial transport medium  Result Value Ref Range Status   SARS Coronavirus 2 by RT PCR NEGATIVE NEGATIVE Final    Comment: (NOTE) SARS-CoV-2 target nucleic acids are NOT DETECTED.  The SARS-CoV-2 RNA is generally detectable in upper respiratory specimens during the acute phase of infection. The lowest concentration of SARS-CoV-2 viral copies this assay can detect is 138 copies/mL. A negative result does not preclude SARS-Cov-2 infection and should not be used as the sole basis for treatment or other patient management decisions. A negative result may occur with   improper specimen collection/handling, submission of specimen other than nasopharyngeal swab, presence of viral mutation(s) within the areas targeted by this assay, and inadequate number of viral copies(<138 copies/mL). A negative result must be combined with clinical observations, patient history, and epidemiological information. The expected result is Negative.  Fact Sheet for Patients:  EntrepreneurPulse.com.au  Fact Sheet for Healthcare Providers:  IncredibleEmployment.be  This test is no t yet approved or cleared by the [place] FDA and  has been authorized for detection and/or diagnosis of SARS-CoV-2 by FDA under an Emergency Use Authorization (EUA). This EUA will remain  in effect (meaning this test can be used) for the duration of the COVID-19 declaration under Section 564(b)(1) of the Act, 21 U.S.C.section 360bbb-3(b)(1), unless the authorization is terminated  or revoked sooner.       Influenza A by PCR NEGATIVE NEGATIVE Final   Influenza B by PCR NEGATIVE NEGATIVE Final    Comment: (NOTE) The Xpert Xpress SARS-CoV-2/FLU/RSV plus assay is intended as an aid in the diagnosis of influenza from Nasopharyngeal swab specimens and should not be used as a sole basis for treatment. Nasal washings and aspirates are unacceptable for Xpert Xpress SARS-CoV-2/FLU/RSV testing.  Fact Sheet for Patients: EntrepreneurPulse.com.au  Fact Sheet for Healthcare Providers: IncredibleEmployment.be  This test is not yet approved or cleared by the [place] FDA and has been authorized for detection and/or diagnosis of SARS-CoV-2 by FDA under an Emergency Use Authorization (EUA). This EUA will remain in effect (meaning this test can be used) for the duration of the COVID-19 declaration under Section 564(b)(1) of the Act, 21 U.S.C. section 360bbb-3(b)(1), unless the authorization is terminated  or revoked.  Performed at [facility], [address] [place], [place] [address]   Blood culture (routine x 2)     Status: None (Preliminary result)   Collection Time: 05/25/21  7:02 PM   Specimen: BLOOD  Result Value Ref Range Status   Specimen Description BLOOD BLOOD LEFT ARM  Final   Special Requests   Final    BOTTLES DRAWN AEROBIC AND ANAEROBIC Blood Culture adequate volume   Culture   Final    NO GROWTH 3 DAYS Performed at [facility], [address] [place], [place] [address]    Report Status PENDING  Incomplete  Blood culture (routine x 2)     Status: None (Preliminary result)   Collection Time: 05/25/21  7:02 PM   Specimen: BLOOD  Result Value Ref Range Status   Specimen Description BLOOD LEFT ANTECUBITAL  Final   Special Requests   Final    BOTTLES DRAWN AEROBIC AND ANAEROBIC Blood Culture adequate volume   Culture  Setup Time   Final    ANAEROBIC BOTTLE ONLY GRAM NEGATIVE RODS Organism ID to follow CRITICAL RESULT CALLED TO, READ BACK BY AND VERIFIED WITH: Rito Ehrlich @1651  05/26/21 MJU Performed at [facility], [address] [place], [place] [address]    Culture GRAM NEGATIVE RODS  Final   Report Status PENDING  Incomplete  Blood Culture ID Panel (Reflexed)     Status: Abnormal   Collection Time: 05/25/21  7:02 PM  Result Value Ref Range Status   Enterococcus faecalis NOT DETECTED NOT DETECTED Final   Enterococcus Faecium NOT DETECTED NOT DETECTED Final   Listeria monocytogenes NOT DETECTED NOT DETECTED Final   Staphylococcus species NOT DETECTED NOT DETECTED Final   Staphylococcus aureus (BCID) NOT DETECTED NOT DETECTED Final   Staphylococcus epidermidis NOT DETECTED NOT DETECTED Final   Staphylococcus lugdunensis NOT DETECTED NOT DETECTED Final   Streptococcus species NOT DETECTED NOT DETECTED Final   Streptococcus agalactiae NOT DETECTED NOT DETECTED Final   Streptococcus pneumoniae NOT DETECTED NOT DETECTED Final    Streptococcus pyogenes NOT DETECTED NOT DETECTED Final   A.calcoaceticus-baumannii NOT DETECTED NOT DETECTED Final   Bacteroides fragilis NOT DETECTED NOT DETECTED Final   Enterobacterales DETECTED (A) NOT DETECTED Final    Comment: Enterobacterales represent a large order of gram negative bacteria, not a single organism. Refer to culture for further identification. CRITICAL RESULT CALLED TO, READ BACK BY AND VERIFIED WITH: WALID NAZARI @1651  05/26/21 MJU    Enterobacter cloacae complex NOT DETECTED NOT DETECTED Final   Escherichia coli NOT DETECTED NOT DETECTED Final   Klebsiella  aerogenes NOT DETECTED NOT DETECTED Final   Klebsiella oxytoca NOT DETECTED NOT DETECTED Final   Klebsiella pneumoniae NOT DETECTED NOT DETECTED Final   Proteus species NOT DETECTED NOT DETECTED Final   Salmonella species NOT DETECTED NOT DETECTED Final   Serratia marcescens NOT DETECTED NOT DETECTED Final   Haemophilus influenzae NOT DETECTED NOT DETECTED Final   Neisseria meningitidis NOT DETECTED NOT DETECTED Final   Pseudomonas aeruginosa NOT DETECTED NOT DETECTED Final   Stenotrophomonas maltophilia NOT DETECTED NOT DETECTED Final   Candida albicans NOT DETECTED NOT DETECTED Final   Candida auris NOT DETECTED NOT DETECTED Final   Candida glabrata NOT DETECTED NOT DETECTED Final   Candida krusei NOT DETECTED NOT DETECTED Final   Candida parapsilosis NOT DETECTED NOT DETECTED Final   Candida tropicalis NOT DETECTED NOT DETECTED Final   Cryptococcus neoformans/gattii NOT DETECTED NOT DETECTED Final   CTX-M ESBL NOT DETECTED NOT DETECTED Final   Carbapenem resistance IMP NOT DETECTED NOT DETECTED Final   Carbapenem resistance KPC NOT DETECTED NOT DETECTED Final   Carbapenem resistance NDM NOT DETECTED NOT DETECTED Final   Carbapenem resist OXA 48 LIKE NOT DETECTED NOT DETECTED Final   Carbapenem resistance VIM NOT DETECTED NOT DETECTED Final    Comment: Performed at [facility], [address], [place], [place] [address]         Radiology Studies: No results found.      Scheduled Meds: . doxycycline  100 mg Oral Q12H  . enoxaparin (LOVENOX) injection  40 mg Subcutaneous Q24H  . levothyroxine  88 mcg Oral Q0600  . lidocaine  1 patch Transdermal Q24H  . mirtazapine  15 mg Oral QHS  . nicotine  21 mg Transdermal Daily  . OLANZapine  15 mg Oral QHS  . pantoprazole  40 mg Oral Daily  . potassium chloride  40 mEq Oral Once  . scopolamine  1 patch Transdermal Q72H   Continuous Infusions: . sodium chloride 75 mL/hr at 05/27/21 1303  . cefTRIAXone (ROCEPHIN)  IV 2 g (05/27/21 1807)     LOS: 2 days    Time spent: 30 mins     Wyvonnia Dusky, MD [facility] Pager [phone]  If 7PM-7AM, please contact night-coverage 05/28/2021, 7:21 AM

## 2021-05-28 NOTE — Care Management Important Message (Signed)
Important Message  Patient Details  Name: Leslie Wong MRN: [id number] Date of Birth: 1956/08/13   Medicare Important Message Given:  Yes  Patient was sleeping so was unable to obtain a signature. I left a copy of the Important Message on her bedside table to review at her leisure.  [name] 05/28/2021, 2:18 PM

## 2021-05-28 NOTE — Progress Notes (Signed)
Physical Therapy Treatment Patient Details Name: Leslie Wong MRN: [id number] DOB: 10-10-1956 Today's Date: 05/28/2021    History of Present Illness Pt admitted for pneumonia of B lungs with complaints of fall in shower now with black eye and N/V. History includes bipolar, GERD, fibromyalgia and COPD.    PT Comments    Pt is able to stand and walk x 1 lap in hallway.  Reaches for railings due to feeling unsteady but she is not interested in using a walker.  Discussed overall safety and encouraged it's use at home but she remains resistant.  "I need to walk on my own as long as I can."  Discussed fall prevention and safety.     Follow Up Recommendations  Home health PT;Supervision for mobility/OOB     Equipment Recommendations  Rolling walker with 5" wheels    Recommendations for Other Services       Precautions / Restrictions Precautions Precautions: Fall Restrictions Weight Bearing Restrictions: No    Mobility  Bed Mobility               General bed mobility comments: up in reclienr before and after    Transfers Overall transfer level: Needs assistance Equipment used: None Transfers: Sit to/from Stand Sit to Stand: Supervision            Ambulation/Gait Ambulation/Gait assistance: Min guard;Supervision Gait Distance (Feet): 160 Feet Assistive device: None Gait Pattern/deviations: Step-through pattern;Wide base of support Gait velocity: dec   General Gait Details: unsteady but no formal LOB's,  reaching for rails in hallway but refused RW "I don't want to use that"   Stairs             Wheelchair Mobility    Modified Rankin (Stroke Patients Only)       Balance Overall balance assessment: Needs assistance Sitting-balance support: Feet supported;Bilateral upper extremity supported Sitting balance-Leahy Scale: Good     Standing balance support: No upper extremity supported Standing balance-Leahy Scale: Fair                               Cognition Arousal/Alertness: Awake/alert Behavior During Therapy: WFL for tasks assessed/performed Overall Cognitive Status: Within Functional Limits for tasks assessed                                        Exercises      General Comments        Pertinent Vitals/Pain Pain Assessment: No/denies pain    Home Living                      Prior Function            PT Goals (current goals can now be found in the care plan section) Progress towards PT goals: Progressing toward goals    Frequency    Min 2X/week      PT Plan      Co-evaluation              AM-PAC PT "6 Clicks" Mobility   Outcome Measure  Help needed turning from your back to your side while in a flat bed without using bedrails?: None Help needed moving from lying on your back to sitting on the side of a flat bed without using bedrails?: None Help needed moving to and from a  bed to a chair (including a wheelchair)?: None Help needed standing up from a chair using your arms (e.g., wheelchair or bedside chair)?: None Help needed to walk in hospital room?: A Little Help needed climbing 3-5 steps with a railing? : A Little 6 Click Score: 22    End of Session Equipment Utilized During Treatment: Gait belt Activity Tolerance: Patient tolerated treatment well Patient left: in chair;with chair alarm set Nurse Communication: Mobility status PT Visit Diagnosis: Unsteadiness on feet (R26.81);Muscle weakness (generalized) (M62.81)     Time: 0102-7253 PT Time Calculation (min) (ACUTE ONLY): 8 min  Charges:  $Gait Training: 8-22 mins                    Chesley Noon, PTA 05/28/21, 1:51 PM

## 2021-05-29 DIAGNOSIS — T796XXA Traumatic ischemia of muscle, initial encounter: Secondary | ICD-10-CM | POA: Diagnosis not present

## 2021-05-29 DIAGNOSIS — J189 Pneumonia, unspecified organism: Secondary | ICD-10-CM | POA: Diagnosis not present

## 2021-05-29 DIAGNOSIS — R7881 Bacteremia: Secondary | ICD-10-CM | POA: Diagnosis not present

## 2021-05-29 LAB — CULTURE, BLOOD (ROUTINE X 2): Special Requests: ADEQUATE

## 2021-05-29 LAB — COMPREHENSIVE METABOLIC PANEL
ALT: 53 U/L — ABNORMAL HIGH (ref 0–44)
AST: 43 U/L — ABNORMAL HIGH (ref 15–41)
Albumin: 3.4 g/dL — ABNORMAL LOW (ref 3.5–5.0)
Alkaline Phosphatase: 79 U/L (ref 38–126)
Anion gap: 7 (ref 5–15)
BUN: 10 mg/dL (ref 8–23)
CO2: 24 mmol/L (ref 22–32)
Calcium: 9.7 mg/dL (ref 8.9–10.3)
Chloride: 111 mmol/L (ref 98–111)
Creatinine, Ser: 0.68 mg/dL (ref 0.44–1.00)
GFR, Estimated: >60 mL/min (ref >60)
Glucose, Bld: 96 mg/dL (ref 70–99)
Potassium: 3.3 mmol/L — ABNORMAL LOW (ref 3.5–5.1)
Sodium: 142 mmol/L (ref 135–145)
Total Bilirubin: 0.7 mg/dL (ref 0.3–1.2)
Total Protein: 6.6 g/dL (ref 6.5–8.1)

## 2021-05-29 LAB — CBC
HCT: 40.9 % (ref 36.0–46.0)
Hemoglobin: 14 g/dL (ref 12.0–15.0)
MCH: 31.5 pg (ref 26.0–34.0)
MCHC: 34.2 g/dL (ref 30.0–36.0)
MCV: 92.1 fL (ref 80.0–100.0)
Platelets: 249 10*3/uL (ref 150–400)
RBC: 4.44 MIL/uL (ref 3.87–5.11)
RDW: 13.8 % (ref 11.5–15.5)
WBC: 9.2 10*3/uL (ref 4.0–10.5)
nRBC: 0 % (ref 0.0–0.2)

## 2021-05-29 LAB — CK: Total CK: 475 U/L — ABNORMAL HIGH (ref 38–234)

## 2021-05-29 MED ORDER — DOXYCYCLINE HYCLATE 100 MG PO TABS: 100.0000 mg | ORAL_TABLET | Freq: Two times a day (BID) | ORAL | 0 refills | Status: AC

## 2021-05-29 MED ORDER — NICOTINE 21 MG/24HR TD PT24: 21.0000 mg | MEDICATED_PATCH | Freq: Every day | TRANSDERMAL | 0 refills | Status: AC

## 2021-05-29 MED ORDER — CIPROFLOXACIN HCL 500 MG PO TABS: 500.0000 mg | ORAL_TABLET | Freq: Two times a day (BID) | ORAL | 0 refills | Status: DC

## 2021-05-29 MED ORDER — POTASSIUM CHLORIDE CRYS ER 20 MEQ PO TBCR
40.0000 meq | EXTENDED_RELEASE_TABLET | Freq: Once | ORAL | Status: AC
  Administered 2021-05-29: 40 meq via ORAL
  Filled 2021-05-29: qty 2

## 2021-05-29 NOTE — Progress Notes (Signed)
IV removed, d/c education provided and all questions answered. Pt aware of 3 scripts at pharmacy. Assisted in collecting belongings and getting dressed. Awaiting ride

## 2021-05-29 NOTE — Discharge Summary (Signed)
Physician Discharge Summary  Leslie Wong ZDG:[id number] DOB: 1956/09/13 DOA: 05/25/2021  PCP: Tonia Ghent, MD  Admit date: 05/25/2021 Discharge date: 05/29/2021  Admitted From: home  Disposition: home w/ home health   Recommendations for Outpatient Follow-up:  1. Follow up with PCP in 1-2 weeks   Home Health: yes  Equipment/Devices:  Discharge Condition: stable  CODE STATUS: full  Diet recommendation: Regular   Brief/Interim Summary: HPI was taken from Dr. Cyd Silence:  65 year old female with past medical history of hypothyroidism, bipolar disorder, gastroesophageal reflux disease, fibromyalgia, nicotine dependence and COPD presenting to [facility] emergency department status post fall in the shower.  Patient explains that for the past several days she has been developing progressively worsening shortness of breath.  Shortness of breath was initially mild in intensity but progressively has become more more severe.  Shortness of breath is worse with exertion and improved with rest.  Shortness of breath associated with cough productive with yellow to green sputum.  Patient states that she has also been experiencing associated generalized weakness and lightheadedness upon standing.  Patient also complains of associated poor appetite.    Patient explains that she was attempting to take a shower 2 days ago when she suddenly got tangled up in her shower curtain, falling in the tub and striking her face.  Patient explains that ever since that fall her symptoms seem to have gotten worse.  She has been aching all over since eventually presented to [facility] emergency department via EMS for evaluation.  Upon evaluation in the emergency department, patient is been found to have a leukocytosis of 19.9.  Trauma evaluation including CT head, CT of the facial bones and CT of the cervical spine has been negative.  Patient been found to be suffering from mild rhabdomyolysis with CK of 7474.  Urine  drug screen is positive for THC, TCAs and opioids.  Due to patient's ongoing complaints of severe generalized body pain the ER provider ended up performing a CT of the chest abdomen pelvis revealing bilateral groundglass infiltrates concerning for multilobar pneumonia.  COVID-19 testing was negative.  Patient was initiated on intravenous ceftriaxone and doxycycline.  The hospitalist group was then called to assess the patient for admission to the hospital.     Hospital course from Dr. Jimmye Norman 6/1-05/29/21: Pt was found to have b/l pneumonia and was started on doxycyline, IV ceftriaxone, bronchodilators & incentive spirometry. Pt did not require supplemental oxygen while inpatient. Furthermore, pt was found to have pantoea species bacteremia and was treated w/ IV ceftriaxone inpatient and was switched to po cipro at d/c. Repeat blood cxs were taken and were NGTD prior to d/c. PT/OT evaluated the pt and recommended home health. Home health was set up by CM prior to d/c.   Discharge Diagnoses:  Principal Problem:   Pneumonia of both lungs due to infectious organism Active Problems:   Hypothyroidism   Bipolar affective disorder, current episode hypomanic ([place])   Gastroesophageal reflux disease with hiatal hernia   Nicotine dependence, cigarettes, uncomplicated   Rhabdomyolysis   Fall at home, initial encounter   COPD (chronic obstructive pulmonary disease) ([place])   Pneumonia   B/l pneumonia: continue on doxycycline, IV ceftriaxone, bronchodilators & encourage incentive spirometry. COVID19 neg   Bacteremia: etiology unclear. Blood cxs growing pantoea species. Continue on IV ceftriaxone inpatient and switched to po cipro at d/c based off of cx results. Repeat blood cxs NGTD  Rhabdomyolysis: likely related to recent fall. D/c IVFs. CK continues to trend down  daily. Cr is WNL   Hypokalemia: KCl repleated.   Transaminitis: continues to trend down daily   Fall: PT/OT recs HH.   Hypothyroidism:  continue on home dose of levothyroxine   Bipolar affective disorder: continue on home dose of olanzapine   Smoker: nicotine patch to prevent w/drawal. Smoking cessation counseling   GERD: continue on pantoprazole   Elevated troponins: likely secondary to demand ischemia. No chest pain  Discharge Instructions  Discharge Instructions    Diet general   Complete by: As directed    Discharge instructions   Complete by: As directed    F/u w/ PCP in 1-2 weeks   Increase activity slowly   Complete by: As directed      Allergies as of 05/29/2021      Reactions   Doxepin Other (See Comments)   Pt reports "that made me have seizures"   Valproic Acid Shortness Of Breath   Aspirin    REACTION: UNSPECIFIED   Atorvastatin Other (See Comments)   Tongue swelling, legs cramping   Baclofen    REACTION: Throat swells up   Buprenorphine    Vomiting, intolerant   Duloxetine Other (See Comments)   Vision loss, weight loss   Erythromycin    REACTION: UNSPECIFIED   Gabapentin    REACTION: throat swells up   Metoclopramide Hcl    REACTION: states "messed me up"   Nortriptyline    Other reaction(s): Other (See Comments) Vision issues   Nsaids Other (See Comments)   Stomach problems   Nucynta Er [tapentadol Hcl Er]    Intolerant, see note from 07/24/12.     Nucynta [tapentadol]    AMS   Prednisone    GI intolance   Pregabalin    Seroquel [quetiapine Fumerate] Other (See Comments)   Loss control, felt like on another planet   Sulfa Antibiotics Nausea And Vomiting   Topamax Other (See Comments)   Blisters in mouth and tongue   Vicodin [hydrocodone-acetaminophen]    Nausea, thrush and constipation      Medication List    TAKE these medications   albuterol 108 (90 Base) MCG/ACT inhaler Commonly known as: VENTOLIN HFA Inhale 2 puffs into the lungs every 6 (six) hours as needed for wheezing or shortness of breath.   amitriptyline 25 MG tablet Commonly known as: ELAVIL Take 25 mg  by mouth at bedtime as needed for sleep (ORIGINAL DGL:OVFI 1 TO 2 TABLETS BY MOUTH AT BEDTIME FOR SLEEP OR PAIN).   budesonide-formoterol 80-4.5 MCG/ACT inhaler Commonly known as: SYMBICORT Inhale 2 puffs into the lungs 2 (two) times daily. Rinse after use.   ciprofloxacin 500 MG tablet Commonly known as: Cipro Take 1 tablet (500 mg total) by mouth 2 (two) times daily for 7 days.   doxycycline 100 MG tablet Commonly known as: VIBRA-TABS Take 1 tablet (100 mg total) by mouth every 12 (twelve) hours for 5 days.   hydrOXYzine 50 MG tablet Commonly known as: ATARAX/VISTARIL Take 1 tablet (50 mg total) by mouth 3 (three) times daily as needed for anxiety.   levothyroxine 88 MCG tablet Commonly known as: SYNTHROID TAKE 1 TABLET(88 MCG) BY MOUTH DAILY AT 6 AM   mineral oil-hydrophilic petrolatum ointment Apply topically daily as needed for dry skin.   mirtazapine 15 MG tablet Commonly known as: REMERON Take 1 tablet (15 mg total) by mouth at bedtime.   OLANZapine 15 MG tablet Commonly known as: ZYPREXA Take 1 tablet (15 mg total) by mouth at bedtime.  ondansetron 4 MG disintegrating tablet Commonly known as: ZOFRAN-ODT Take 4 mg by mouth 2 (two) times daily.   Oxycodone HCl 10 MG Tabs Take 10 mg by mouth every 6 (six) hours as needed for pain.   pantoprazole 40 MG tablet Commonly known as: PROTONIX Take 1 tablet (40 mg total) by mouth daily.   pregabalin 150 MG capsule Commonly known as: LYRICA Take 150 mg by mouth 3 (three) times daily.   tiZANidine 4 MG tablet Commonly known as: ZANAFLEX Take 4 mg by mouth 3 (three) times daily as needed.   valACYclovir 500 MG tablet Commonly known as: VALTREX Take 1 tablet (500 mg total) by mouth daily.            Durable Medical Equipment  (From admission, onward)         Start     Ordered   05/27/21 1024  For home use only DME Walker rolling  Once       Question Answer Comment  Walker: With 5 Inch Wheels   Patient  needs a walker to treat with the following condition Fall against object      05/27/21 1024          Allergies  Allergen Reactions  . Doxepin Other (See Comments)    Pt reports "that made me have seizures"  . Valproic Acid Shortness Of Breath  . Aspirin     REACTION: UNSPECIFIED  . Atorvastatin Other (See Comments)    Tongue swelling, legs cramping  . Baclofen     REACTION: Throat swells up  . Buprenorphine     Vomiting, intolerant  . Duloxetine Other (See Comments)    Vision loss, weight loss  . Erythromycin     REACTION: UNSPECIFIED  . Gabapentin     REACTION: throat swells up  . Metoclopramide Hcl     REACTION: states "messed me up"  . Nortriptyline     Other reaction(s): Other (See Comments) Vision issues  . Nsaids Other (See Comments)    Stomach problems  . Nucynta Er [Tapentadol Hcl Er]     Intolerant, see note from 07/24/12.    Gean [place]ch[place] [Tapentadol]     AMS  . Prednisone     GI intolance  . Pregabalin   . Seroquel [Quetiapine Fumerate] Other (See Comments)    Loss control, felt like on another planet  . Sulfa Antibiotics Nausea And Vomiting  . Topamax Other (See Comments)    Blisters in mouth and tongue  . Vicodin [Hydrocodone-Acetaminophen]     Nausea, thrush and constipation    Consultations:     Procedures/Studies: DG Chest 2 View  Result Date: 05/25/2021 CLINICAL DATA:  Fall, back pain EXAM: CHEST - 2 VIEW COMPARISON:  12/08/2020 FINDINGS: Stable cardiomediastinal contours. Increased bilateral perihilar and bibasilar interstitial markings. No pleural effusion or pneumothorax. No acute osseous findings. IMPRESSION: Increased bilateral perihilar and bibasilar interstitial markings, findings may reflect bronchitic type lung changes versus mild edema. Electronically Signed   By: Davina Poke D.O.   On: 05/25/2021 16:37   DG Tibia/Fibula Left  Result Date: 05/25/2021 CLINICAL DATA:  [place] in the shower 2 days ago.  Bruising and pain. EXAM: LEFT  TIBIA AND FIBULA - 2 VIEW COMPARISON:  None. FINDINGS: There is no evidence of fracture or other focal bone lesions. Soft tissues are unremarkable. IMPRESSION: Normal radiographs. Electronically Signed   By: Nelson Chimes M.D.   On: 05/25/2021 16:35   CT Head Wo Contrast  Result Date:  05/25/2021 CLINICAL DATA:  Fall yesterday with headaches and neck pain, initial encounter EXAM: CT HEAD WITHOUT CONTRAST CT MAXILLOFACIAL WITHOUT CONTRAST CT CERVICAL SPINE WITHOUT CONTRAST TECHNIQUE: Multidetector CT imaging of the head, cervical spine, and maxillofacial structures were performed using the standard protocol without intravenous contrast. Multiplanar CT image reconstructions of the cervical spine and maxillofacial structures were also generated. COMPARISON:  None. FINDINGS: CT HEAD FINDINGS Brain: No evidence of acute infarction, hemorrhage, hydrocephalus, extra-axial collection or mass lesion/mass effect. Vascular: No hyperdense vessel or unexpected calcification. Skull: Normal. Negative for fracture or focal lesion. Other: None. CT MAXILLOFACIAL FINDINGS Osseous: No acute fracture or dislocation is noted. Patient is edentulous. Orbits: Orbits and their contents are within normal limits. Sinuses: Paranasal sinuses demonstrate mucosal thickening in the right maxillary antrum. No air-fluid level is seen. Soft tissues: Surrounding soft tissue structures demonstrate mild right periorbital swelling consistent with the recent injury. No other soft tissue abnormality is noted. CT CERVICAL SPINE FINDINGS Alignment: Mild straightening of the normal cervical lordosis is noted likely related to muscular spasm. Skull base and vertebrae: 7 cervical segments are well visualized. Vertebral body height is well maintained. Mild facet hypertrophic changes and osteophytic changes are noted. Mild motion artifact limits the examination somewhat. No acute fracture or acute facet abnormality is noted. Soft tissues and spinal canal:  Surrounding soft tissue structures are within normal limits. Upper chest: Lung apices are within normal limits. Other: None IMPRESSION: CT of the head: No acute intracranial abnormality is noted. CT of the maxillofacial bones: No acute bony abnormality is seen. Right maxillary mucosal thickening is seen. Mild right periorbital soft tissue swelling without focal hematoma. CT of the cervical spine: Degenerative change without acute abnormality. Electronically Signed   By: Inez Catalina M.D.   On: 05/25/2021 15:21   CT Cervical Spine Wo Contrast  Result Date: 05/25/2021 CLINICAL DATA:  Fall yesterday with headaches and neck pain, initial encounter EXAM: CT HEAD WITHOUT CONTRAST CT MAXILLOFACIAL WITHOUT CONTRAST CT CERVICAL SPINE WITHOUT CONTRAST TECHNIQUE: Multidetector CT imaging of the head, cervical spine, and maxillofacial structures were performed using the standard protocol without intravenous contrast. Multiplanar CT image reconstructions of the cervical spine and maxillofacial structures were also generated. COMPARISON:  None. FINDINGS: CT HEAD FINDINGS Brain: No evidence of acute infarction, hemorrhage, hydrocephalus, extra-axial collection or mass lesion/mass effect. Vascular: No hyperdense vessel or unexpected calcification. Skull: Normal. Negative for fracture or focal lesion. Other: None. CT MAXILLOFACIAL FINDINGS Osseous: No acute fracture or dislocation is noted. Patient is edentulous. Orbits: Orbits and their contents are within normal limits. Sinuses: Paranasal sinuses demonstrate mucosal thickening in the right maxillary antrum. No air-fluid level is seen. Soft tissues: Surrounding soft tissue structures demonstrate mild right periorbital swelling consistent with the recent injury. No other soft tissue abnormality is noted. CT CERVICAL SPINE FINDINGS Alignment: Mild straightening of the normal cervical lordosis is noted likely related to muscular spasm. Skull base and vertebrae: 7 cervical segments  are well visualized. Vertebral body height is well maintained. Mild facet hypertrophic changes and osteophytic changes are noted. Mild motion artifact limits the examination somewhat. No acute fracture or acute facet abnormality is noted. Soft tissues and spinal canal: Surrounding soft tissue structures are within normal limits. Upper chest: Lung apices are within normal limits. Other: None IMPRESSION: CT of the head: No acute intracranial abnormality is noted. CT of the maxillofacial bones: No acute bony abnormality is seen. Right maxillary mucosal thickening is seen. Mild right periorbital soft tissue swelling without focal hematoma.  CT of the cervical spine: Degenerative change without acute abnormality. Electronically Signed   By: Inez Catalina M.D.   On: 05/25/2021 15:21   CT CHEST ABDOMEN PELVIS WO CONTRAST  Result Date: 05/25/2021 CLINICAL DATA:  65 year old female with abdominal trauma. EXAM: CT CHEST, ABDOMEN AND PELVIS WITHOUT CONTRAST TECHNIQUE: Multidetector CT imaging of the chest, abdomen and pelvis was performed following the standard protocol without IV contrast. COMPARISON:  Chest CT dated 02/14/2019 and radiograph dated 05/25/2021. FINDINGS: Evaluation of this exam is limited in the absence of intravenous contrast. CT CHEST FINDINGS Cardiovascular: There is no cardiomegaly or pericardial effusion. Mild coronary vascular calcification. There is mild atherosclerotic calcification of the thoracic aorta. No aneurysmal dilatation. The central pulmonary arteries are grossly unremarkable on this noncontrast CT. Mediastinum/Nodes: No hilar or mediastinal adenopathy. The esophagus is grossly unremarkable. No mediastinal fluid collection. Lungs/Pleura: Bilateral clusters of ground-glass densities most consistent with multi lobar pneumonia, possibly viral or atypical in etiology. Clinical correlation is recommended. Areas of atelectasis/scarring noted in the right middle lobe and lingula. There is no  pleural effusion or pneumothorax. Mild paraseptal emphysema. The central airways are patent. Musculoskeletal: No acute osseous pathology. CT ABDOMEN PELVIS FINDINGS No intra-abdominal free air or free fluid. Hepatobiliary: Fatty liver. No intrahepatic biliary dilatation. Multiple small stones within gallbladder. No pericholecystic fluid or evidence of acute cholecystitis by CT. Pancreas: Unremarkable. No pancreatic ductal dilatation or surrounding inflammatory changes. Spleen: Normal in size without focal abnormality. Adrenals/Urinary Tract: The adrenal glands unremarkable. There is a 2 cm left renal posterior exophytic hypodense lesion which demonstrates fluid attenuation most likely a cyst. There is no hydronephrosis or nephrolithiasis on either side. The visualized ureters and urinary bladder appear unremarkable. Stomach/Bowel: There is sigmoid diverticulosis without active inflammatory changes. There is moderate stool throughout the colon. There is no bowel obstruction or active inflammation. The appendix is not visualized with certainty. No inflammatory changes identified in the right lower quadrant. Vascular/Lymphatic: Mild aortoiliac atherosclerotic disease. The IVC is unremarkable. No portal venous gas. There is no adenopathy. Reproductive: Hysterectomy. No adnexal masses. Other: Small fat containing bilateral inguinal hernias. No fluid collection or bowel herniation. Musculoskeletal: Grade 1 L4-L5 anterolisthesis. No acute osseous pathology. IMPRESSION: 1. Bilateral clusters of ground-glass densities most consistent with multi lobar pneumonia, possibly viral or atypical in etiology. Clinical correlation is recommended. 2. No acute/traumatic intra-abdominal or pelvic pathology. 3. Fatty liver. 4. Cholelithiasis. 5. Sigmoid diverticulosis. 6. Aortic Atherosclerosis (ICD10-I70.0). Electronically Signed   By: Anner Crete M.D.   On: 05/25/2021 18:43   CT Maxillofacial Wo Contrast  Result Date:  05/25/2021 CLINICAL DATA:  Fall yesterday with headaches and neck pain, initial encounter EXAM: CT HEAD WITHOUT CONTRAST CT MAXILLOFACIAL WITHOUT CONTRAST CT CERVICAL SPINE WITHOUT CONTRAST TECHNIQUE: Multidetector CT imaging of the head, cervical spine, and maxillofacial structures were performed using the standard protocol without intravenous contrast. Multiplanar CT image reconstructions of the cervical spine and maxillofacial structures were also generated. COMPARISON:  None. FINDINGS: CT HEAD FINDINGS Brain: No evidence of acute infarction, hemorrhage, hydrocephalus, extra-axial collection or mass lesion/mass effect. Vascular: No hyperdense vessel or unexpected calcification. Skull: Normal. Negative for fracture or focal lesion. Other: None. CT MAXILLOFACIAL FINDINGS Osseous: No acute fracture or dislocation is noted. Patient is edentulous. Orbits: Orbits and their contents are within normal limits. Sinuses: Paranasal sinuses demonstrate mucosal thickening in the right maxillary antrum. No air-fluid level is seen. Soft tissues: Surrounding soft tissue structures demonstrate mild right periorbital swelling consistent with the recent injury. No other  soft tissue abnormality is noted. CT CERVICAL SPINE FINDINGS Alignment: Mild straightening of the normal cervical lordosis is noted likely related to muscular spasm. Skull base and vertebrae: 7 cervical segments are well visualized. Vertebral body height is well maintained. Mild facet hypertrophic changes and osteophytic changes are noted. Mild motion artifact limits the examination somewhat. No acute fracture or acute facet abnormality is noted. Soft tissues and spinal canal: Surrounding soft tissue structures are within normal limits. Upper chest: Lung apices are within normal limits. Other: None IMPRESSION: CT of the head: No acute intracranial abnormality is noted. CT of the maxillofacial bones: No acute bony abnormality is seen. Right maxillary mucosal  thickening is seen. Mild right periorbital soft tissue swelling without focal hematoma. CT of the cervical spine: Degenerative change without acute abnormality. Electronically Signed   By: Inez Catalina M.D.   On: 05/25/2021 15:21      Subjective: Pt c/o fatigue    Discharge Exam: Vitals:   05/29/21 0315 05/29/21 0805  BP: 125/90 (!) 131/98  Pulse: 86 81  Resp: 17 16  Temp: 98 F (36.7 C) 98.4 F (36.9 C)  SpO2: 93% 93%   Vitals:   05/28/21 2037 05/28/21 2305 05/29/21 0315 05/29/21 0805  BP: 140/85 (!) 155/89 125/90 (!) 131/98  Pulse: 71 70 86 81  Resp: 15 16 17 16   Temp: 98.4 F (36.9 C) 98.1 F (36.7 C) 98 F (36.7 C) 98.4 F (36.9 C)  TempSrc: Oral Oral    SpO2: 95% 97% 93% 93%  Weight:      Height:        General: Pt is alert, awake, not in acute distress Cardiovascular: S1/S2 +, no rubs, no gallops Respiratory: decreased breath sounds b/l  Abdominal: Soft, NT, ND, bowel sounds + Extremities: no edema, no cyanosis    The results of significant diagnostics from this hospitalization (including imaging, microbiology, ancillary and laboratory) are listed below for reference.     Microbiology: Recent Results (from the past 240 hour(s))  Resp Panel by RT-PCR (Flu A&B, Covid) Nasopharyngeal Swab     Status: None   Collection Time: 05/25/21  4:54 PM   Specimen: Nasopharyngeal Swab; Nasopharyngeal(NP) swabs in vial transport medium  Result Value Ref Range Status   SARS Coronavirus 2 by RT PCR NEGATIVE NEGATIVE Final    Comment: (NOTE) SARS-CoV-2 target nucleic acids are NOT DETECTED.  The SARS-CoV-2 RNA is generally detectable in upper respiratory specimens during the acute phase of infection. The lowest concentration of SARS-CoV-2 viral copies this assay can detect is 138 copies/mL. A negative result does not preclude SARS-Cov-2 infection and should not be used as the sole basis for treatment or other patient management decisions. A negative result may occur  with  improper specimen collection/handling, submission of specimen other than nasopharyngeal swab, presence of viral mutation(s) within the areas targeted by this assay, and inadequate number of viral copies(<138 copies/mL). A negative result must be combined with clinical observations, patient history, and epidemiological information. The expected result is Negative.  Fact Sheet for Patients:  EntrepreneurPulse.com.au  Fact Sheet for Healthcare Providers:  IncredibleEmployment.be  This test is no t yet approved or cleared by the [place] FDA and  has been authorized for detection and/or diagnosis of SARS-CoV-2 by FDA under an Emergency Use Authorization (EUA). This EUA will remain  in effect (meaning this test can be used) for the duration of the COVID-19 declaration under Section 564(b)(1) of the Act, 21 U.S.C.section 360bbb-3(b)(1), unless the authorization is  terminated  or revoked sooner.       Influenza A by PCR NEGATIVE NEGATIVE Final   Influenza B by PCR NEGATIVE NEGATIVE Final    Comment: (NOTE) The Xpert Xpress SARS-CoV-2/FLU/RSV plus assay is intended as an aid in the diagnosis of influenza from Nasopharyngeal swab specimens and should not be used as a sole basis for treatment. Nasal washings and aspirates are unacceptable for Xpert Xpress SARS-CoV-2/FLU/RSV testing.  Fact Sheet for Patients: EntrepreneurPulse.com.au  Fact Sheet for Healthcare Providers: IncredibleEmployment.be  This test is not yet approved or cleared by the [place] FDA and has been authorized for detection and/or diagnosis of SARS-CoV-2 by FDA under an Emergency Use Authorization (EUA). This EUA will remain in effect (meaning this test can be used) for the duration of the COVID-19 declaration under Section 564(b)(1) of the Act, 21 U.S.C. section 360bbb-3(b)(1), unless the authorization is terminated  or revoked.  Performed at [facility], [address], [place], [place] [address]   Urine Culture     Status: Abnormal   Collection Time: 05/25/21  6:11 PM   Specimen: Urine, Random  Result Value Ref Range Status   Specimen Description   Final    URINE, RANDOM Performed at [facility], [address] [place], [place] [address]    Special Requests   Final    NONE Performed at [facility], [address] [place], [place] [address]    Culture MULTIPLE SPECIES PRESENT, SUGGEST RECOLLECTION (A)  Final   Report Status 05/28/2021 FINAL  Final  Blood culture (routine x 2)     Status: None (Preliminary result)   Collection Time: 05/25/21  7:02 PM   Specimen: BLOOD  Result Value Ref Range Status   Specimen Description BLOOD BLOOD LEFT ARM  Final   Special Requests   Final    BOTTLES DRAWN AEROBIC AND ANAEROBIC Blood Culture adequate volume   Culture   Final    NO GROWTH 4 DAYS Performed at [facility] [facility], [address] [place], [place] [address]    Report Status PENDING  Incomplete  Blood culture (routine x 2)     Status: Abnormal   Collection Time: 05/25/21  7:02 PM   Specimen: BLOOD  Result Value Ref Range Status   Specimen Description   Final    BLOOD LEFT ANTECUBITAL Performed at [facility], [address] [place], [place] [address]    Special Requests   Final    BOTTLES DRAWN AEROBIC AND ANAEROBIC Blood Culture adequate volume Performed at [facility], [address] [place], [place] [address]    Culture  Setup Time   Final    ANAEROBIC BOTTLE ONLY GRAM NEGATIVE RODS CRITICAL RESULT CALLED TO, READ BACK BY AND VERIFIED WITH: Rito Ehrlich @1651  05/26/21 MJU Performed at [facility], [place] [address] [place], [place] [address]    Culture PANTOEA SPECIES (A)  Final   Report Status 05/29/2021 FINAL  Final   Organism ID, Bacteria PANTOEA SPECIES  Final      Susceptibility   Pantoea species - MIC*     CEFAZOLIN <=4 SENSITIVE Sensitive     CEFEPIME <=0.12 SENSITIVE Sensitive     CEFTAZIDIME <=1 SENSITIVE Sensitive     CEFTRIAXONE <=0.25 SENSITIVE Sensitive     CIPROFLOXACIN <=0.25 SENSITIVE Sensitive     GENTAMICIN <=1 SENSITIVE Sensitive     IMIPENEM <=0.25 SENSITIVE Sensitive     TRIMETH/SULFA <=20 SENSITIVE Sensitive  PIP/TAZO <=4 SENSITIVE Sensitive     * PANTOEA SPECIES  Blood Culture ID Panel (Reflexed)     Status: Abnormal   Collection Time: 05/25/21  7:02 PM  Result Value Ref Range Status   Enterococcus faecalis NOT DETECTED NOT DETECTED Final   Enterococcus Faecium NOT DETECTED NOT DETECTED Final   Listeria monocytogenes NOT DETECTED NOT DETECTED Final   Staphylococcus species NOT DETECTED NOT DETECTED Final   Staphylococcus aureus (BCID) NOT DETECTED NOT DETECTED Final   Staphylococcus epidermidis NOT DETECTED NOT DETECTED Final   Staphylococcus lugdunensis NOT DETECTED NOT DETECTED Final   Streptococcus species NOT DETECTED NOT DETECTED Final   Streptococcus agalactiae NOT DETECTED NOT DETECTED Final   Streptococcus pneumoniae NOT DETECTED NOT DETECTED Final   Streptococcus pyogenes NOT DETECTED NOT DETECTED Final   A.calcoaceticus-baumannii NOT DETECTED NOT DETECTED Final   Bacteroides fragilis NOT DETECTED NOT DETECTED Final   Enterobacterales DETECTED (A) NOT DETECTED Final    Comment: Enterobacterales represent a large order of gram negative bacteria, not a single organism. Refer to culture for further identification. CRITICAL RESULT CALLED TO, READ BACK BY AND VERIFIED WITH: WALID NAZARI @1651  05/26/21 MJU    Enterobacter cloacae complex NOT DETECTED NOT DETECTED Final   Escherichia coli NOT DETECTED NOT DETECTED Final   Klebsiella aerogenes NOT DETECTED NOT DETECTED Final   Klebsiella oxytoca NOT DETECTED NOT DETECTED Final   Klebsiella pneumoniae NOT DETECTED NOT DETECTED Final   Proteus species NOT DETECTED NOT DETECTED Final   Salmonella species NOT  DETECTED NOT DETECTED Final   Serratia marcescens NOT DETECTED NOT DETECTED Final   Haemophilus influenzae NOT DETECTED NOT DETECTED Final   Neisseria meningitidis NOT DETECTED NOT DETECTED Final   Pseudomonas aeruginosa NOT DETECTED NOT DETECTED Final   Stenotrophomonas maltophilia NOT DETECTED NOT DETECTED Final   Candida albicans NOT DETECTED NOT DETECTED Final   Candida auris NOT DETECTED NOT DETECTED Final   Candida glabrata NOT DETECTED NOT DETECTED Final   Candida krusei NOT DETECTED NOT DETECTED Final   Candida parapsilosis NOT DETECTED NOT DETECTED Final   Candida tropicalis NOT DETECTED NOT DETECTED Final   Cryptococcus neoformans/gattii NOT DETECTED NOT DETECTED Final   CTX-M ESBL NOT DETECTED NOT DETECTED Final   Carbapenem resistance IMP NOT DETECTED NOT DETECTED Final   Carbapenem resistance KPC NOT DETECTED NOT DETECTED Final   Carbapenem resistance NDM NOT DETECTED NOT DETECTED Final   Carbapenem resist OXA 48 LIKE NOT DETECTED NOT DETECTED Final   Carbapenem resistance VIM NOT DETECTED NOT DETECTED Final    Comment: Performed at [facility], [address] [place], [place] [address]  CULTURE, BLOOD (ROUTINE X 2) w Reflex to ID Panel     Status: None (Preliminary result)   Collection Time: 05/28/21 12:58 PM   Specimen: BLOOD  Result Value Ref Range Status   Specimen Description BLOOD LEFT HAND  Final   Special Requests   Final    BOTTLES DRAWN AEROBIC AND ANAEROBIC Blood Culture adequate volume   Culture   Final    NO GROWTH < 24 HOURS Performed at [facility], [address] [place], [place] [address]    Report Status PENDING  Incomplete  CULTURE, BLOOD (ROUTINE X 2) w Reflex to ID Panel     Status: None (Preliminary result)   Collection Time: 05/28/21  1:00 PM   Specimen: BLOOD  Result Value Ref Range Status   Specimen Description BLOOD LEFT [facility]  Final   Special Requests  Final    BOTTLES DRAWN AEROBIC AND ANAEROBIC Blood Culture  adequate volume   Culture   Final    NO GROWTH < 24 HOURS Performed at [facility], [address] [place], [place] [address]    Report Status PENDING  Incomplete     Labs: BNP (last 3 results) Recent Labs    05/25/21 1545  BNP 55.7   Basic Metabolic Panel: Recent Labs  Lab 05/25/21 1545 05/26/21 0414 05/27/21 0238 05/28/21 0517 05/29/21 0437  NA 139 143 142 144 142  K 3.9 3.6 3.8 3.2* 3.3*  CL 103 115* 114* 112* 111  CO2 25 23 23 23 24   GLUCOSE 114* 86 89 94 96  BUN 21 16 11 9 10   CREATININE 0.97 0.79 0.70 0.56 0.68  CALCIUM 9.7 8.4* 8.6* 9.5 9.7  MG  --  2.1  --   --   --    Liver Function Tests: Recent Labs  Lab 05/25/21 1545 05/26/21 0414 05/27/21 0238 05/28/21 0517 05/29/21 0437  AST 194* 121* 74* 57* 43*  ALT 101* 71* 63* 60* 53*  ALKPHOS 91 66 69 73 79  BILITOT 0.9 0.4 0.5 0.7 0.7  PROT 7.7 5.9* 5.6* 6.5 6.6  ALBUMIN 4.1 2.9* 2.8* 3.3* 3.4*   No results for input(s): LIPASE, AMYLASE in the last 168 hours. No results for input(s): AMMONIA in the last 168 hours. CBC: Recent Labs  Lab 05/25/21 1545 05/26/21 0414 05/27/21 0238 05/28/21 0517 05/29/21 0437  WBC 19.9* 14.3* 9.9 10.4 9.2  NEUTROABS 17.2* 8.7*  --   --   --   HGB 14.7 12.0 12.2 12.9 14.0  HCT 43.1 36.0 35.9* 38.0 40.9  MCV 92.7 95.2 94.5 92.9 92.1  PLT 242 199 208 235 249   Cardiac Enzymes: Recent Labs  Lab 05/25/21 1545 05/26/21 0414 05/27/21 0238 05/28/21 0517 05/29/21 0437  CKTOTAL 7,474* 3,737* 1,900* 955* 475*   BNP: Invalid input(s): POCBNP CBG: No results for input(s): GLUCAP in the last 168 hours. D-Dimer No results for input(s): DDIMER in the last 72 hours. Hgb A1c No results for input(s): HGBA1C in the last 72 hours. Lipid Profile No results for input(s): CHOL, HDL, LDLCALC, TRIG, CHOLHDL, LDLDIRECT in the last 72 hours. Thyroid function studies No results for input(s): TSH, T4TOTAL, T3FREE, THYROIDAB in the last 72 hours.  Invalid input(s):  FREET3 Anemia work up No results for input(s): VITAMINB12, FOLATE, FERRITIN, TIBC, IRON, RETICCTPCT in the last 72 hours. Urinalysis    Component Value Date/Time   COLORURINE YELLOW (A) 05/25/2021 1811   APPEARANCEUR CLEAR (A) 05/25/2021 1811   LABSPEC 1.019 05/25/2021 1811   PHURINE 5.0 05/25/2021 1811   GLUCOSEU NEGATIVE 05/25/2021 1811   HGBUR NEGATIVE 05/25/2021 1811   BILIRUBINUR NEGATIVE 05/25/2021 1811   KETONESUR 5 (A) 05/25/2021 1811   PROTEINUR 30 (A) 05/25/2021 1811   NITRITE NEGATIVE 05/25/2021 1811   LEUKOCYTESUR NEGATIVE 05/25/2021 1811   Sepsis Labs Invalid input(s): PROCALCITONIN,  WBC,  LACTICIDVEN Microbiology Recent Results (from the past 240 hour(s))  Resp Panel by RT-PCR (Flu A&B, Covid) Nasopharyngeal Swab     Status: None   Collection Time: 05/25/21  4:54 PM   Specimen: Nasopharyngeal Swab; Nasopharyngeal(NP) swabs in vial transport medium  Result Value Ref Range Status   SARS Coronavirus 2 by RT PCR NEGATIVE NEGATIVE Final    Comment: (NOTE) SARS-CoV-2 target nucleic acids are NOT DETECTED.  The SARS-CoV-2 RNA is generally detectable in upper respiratory specimens during the acute phase  of infection. The lowest concentration of SARS-CoV-2 viral copies this assay can detect is 138 copies/mL. A negative result does not preclude SARS-Cov-2 infection and should not be used as the sole basis for treatment or other patient management decisions. A negative result may occur with  improper specimen collection/handling, submission of specimen other than nasopharyngeal swab, presence of viral mutation(s) within the areas targeted by this assay, and inadequate number of viral copies(<138 copies/mL). A negative result must be combined with clinical observations, patient history, and epidemiological information. The expected result is Negative.  Fact Sheet for Patients:  EntrepreneurPulse.com.au  Fact Sheet for Healthcare Providers:   IncredibleEmployment.be  This test is no t yet approved or cleared by the [place] FDA and  has been authorized for detection and/or diagnosis of SARS-CoV-2 by FDA under an Emergency Use Authorization (EUA). This EUA will remain  in effect (meaning this test can be used) for the duration of the COVID-19 declaration under Section 564(b)(1) of the Act, 21 U.S.C.section 360bbb-3(b)(1), unless the authorization is terminated  or revoked sooner.       Influenza A by PCR NEGATIVE NEGATIVE Final   Influenza B by PCR NEGATIVE NEGATIVE Final    Comment: (NOTE) The Xpert Xpress SARS-CoV-2/FLU/RSV plus assay is intended as an aid in the diagnosis of influenza from Nasopharyngeal swab specimens and should not be used as a sole basis for treatment. Nasal washings and aspirates are unacceptable for Xpert Xpress SARS-CoV-2/FLU/RSV testing.  Fact Sheet for Patients: EntrepreneurPulse.com.au  Fact Sheet for Healthcare Providers: IncredibleEmployment.be  This test is not yet approved or cleared by the [place] FDA and has been authorized for detection and/or diagnosis of SARS-CoV-2 by FDA under an Emergency Use Authorization (EUA). This EUA will remain in effect (meaning this test can be used) for the duration of the COVID-19 declaration under Section 564(b)(1) of the Act, 21 U.S.C. section 360bbb-3(b)(1), unless the authorization is terminated or revoked.  Performed at [facility], [address] [place], [place] [address]   Urine Culture     Status: Abnormal   Collection Time: 05/25/21  6:11 PM   Specimen: Urine, Random  Result Value Ref Range Status   Specimen Description   Final    URINE, RANDOM Performed at [facility], [address] [place], [place] [address]    Special Requests   Final    NONE Performed at [facility], [address] [place], [place] [address]    Culture  MULTIPLE SPECIES PRESENT, SUGGEST RECOLLECTION (A)  Final   Report Status 05/28/2021 FINAL  Final  Blood culture (routine x 2)     Status: None (Preliminary result)   Collection Time: 05/25/21  7:02 PM   Specimen: BLOOD  Result Value Ref Range Status   Specimen Description BLOOD BLOOD LEFT ARM  Final   Special Requests   Final    BOTTLES DRAWN AEROBIC AND ANAEROBIC Blood Culture adequate volume   Culture   Final    NO GROWTH 4 DAYS Performed at [facility], [address] [place], [place] [address]    Report Status PENDING  Incomplete  Blood culture (routine x 2)     Status: Abnormal   Collection Time: 05/25/21  7:02 PM   Specimen: BLOOD  Result Value Ref Range Status   Specimen Description   Final    BLOOD LEFT ANTECUBITAL Performed at [facility], [address] [place], [place] [address]    Special Requests   Final  BOTTLES DRAWN AEROBIC AND ANAEROBIC Blood Culture adequate volume Performed at [facility], [address] [place], [place] [address]    Culture  Setup Time   Final    ANAEROBIC BOTTLE ONLY GRAM NEGATIVE RODS CRITICAL RESULT CALLED TO, READ BACK BY AND VERIFIED WITH: Rito Ehrlich @1651  05/26/21 MJU Performed at [facility], [place] [address] [place], [place] [address]    Culture PANTOEA SPECIES (A)  Final   Report Status 05/29/2021 FINAL  Final   Organism ID, Bacteria PANTOEA SPECIES  Final      Susceptibility   Pantoea species - MIC*    CEFAZOLIN <=4 SENSITIVE Sensitive     CEFEPIME <=0.12 SENSITIVE Sensitive     CEFTAZIDIME <=1 SENSITIVE Sensitive     CEFTRIAXONE <=0.25 SENSITIVE Sensitive     CIPROFLOXACIN <=0.25 SENSITIVE Sensitive     GENTAMICIN <=1 SENSITIVE Sensitive     IMIPENEM <=0.25 SENSITIVE Sensitive     TRIMETH/SULFA <=20 SENSITIVE Sensitive     PIP/TAZO <=4 SENSITIVE Sensitive     * PANTOEA SPECIES  Blood Culture ID Panel (Reflexed)     Status: Abnormal   Collection Time: 05/25/21  7:02 PM  Result  Value Ref Range Status   Enterococcus faecalis NOT DETECTED NOT DETECTED Final   Enterococcus Faecium NOT DETECTED NOT DETECTED Final   Listeria monocytogenes NOT DETECTED NOT DETECTED Final   Staphylococcus species NOT DETECTED NOT DETECTED Final   Staphylococcus aureus (BCID) NOT DETECTED NOT DETECTED Final   Staphylococcus epidermidis NOT DETECTED NOT DETECTED Final   Staphylococcus lugdunensis NOT DETECTED NOT DETECTED Final   Streptococcus species NOT DETECTED NOT DETECTED Final   Streptococcus agalactiae NOT DETECTED NOT DETECTED Final   Streptococcus pneumoniae NOT DETECTED NOT DETECTED Final   Streptococcus pyogenes NOT DETECTED NOT DETECTED Final   A.calcoaceticus-baumannii NOT DETECTED NOT DETECTED Final   Bacteroides fragilis NOT DETECTED NOT DETECTED Final   Enterobacterales DETECTED (A) NOT DETECTED Final    Comment: Enterobacterales represent a large order of gram negative bacteria, not a single organism. Refer to culture for further identification. CRITICAL RESULT CALLED TO, READ BACK BY AND VERIFIED WITH: WALID NAZARI @1651  05/26/21 MJU    Enterobacter cloacae complex NOT DETECTED NOT DETECTED Final   Escherichia coli NOT DETECTED NOT DETECTED Final   Klebsiella aerogenes NOT DETECTED NOT DETECTED Final   Klebsiella oxytoca NOT DETECTED NOT DETECTED Final   Klebsiella pneumoniae NOT DETECTED NOT DETECTED Final   Proteus species NOT DETECTED NOT DETECTED Final   Salmonella species NOT DETECTED NOT DETECTED Final   Serratia marcescens NOT DETECTED NOT DETECTED Final   Haemophilus influenzae NOT DETECTED NOT DETECTED Final   Neisseria meningitidis NOT DETECTED NOT DETECTED Final   Pseudomonas aeruginosa NOT DETECTED NOT DETECTED Final   Stenotrophomonas maltophilia NOT DETECTED NOT DETECTED Final   Candida albicans NOT DETECTED NOT DETECTED Final   Candida auris NOT DETECTED NOT DETECTED Final   Candida glabrata NOT DETECTED NOT DETECTED Final   Candida krusei NOT  DETECTED NOT DETECTED Final   Candida parapsilosis NOT DETECTED NOT DETECTED Final   Candida tropicalis NOT DETECTED NOT DETECTED Final   Cryptococcus neoformans/gattii NOT DETECTED NOT DETECTED Final   CTX-M ESBL NOT DETECTED NOT DETECTED Final   Carbapenem resistance IMP NOT DETECTED NOT DETECTED Final   Carbapenem resistance KPC NOT DETECTED NOT DETECTED Final   Carbapenem resistance NDM NOT DETECTED NOT DETECTED Final   Carbapenem resist OXA 48 LIKE NOT DETECTED NOT DETECTED Final  Carbapenem resistance VIM NOT DETECTED NOT DETECTED Final    Comment: Performed at [facility], [address] [place], [place] [address]  CULTURE, BLOOD (ROUTINE X 2) w Reflex to ID Panel     Status: None (Preliminary result)   Collection Time: 05/28/21 12:58 PM   Specimen: BLOOD  Result Value Ref Range Status   Specimen Description BLOOD LEFT HAND  Final   Special Requests   Final    BOTTLES DRAWN AEROBIC AND ANAEROBIC Blood Culture adequate volume   Culture   Final    NO GROWTH < 24 HOURS Performed at [facility], [address], [place], [place] [address]    Report Status PENDING  Incomplete  CULTURE, BLOOD (ROUTINE X 2) w Reflex to ID Panel     Status: None (Preliminary result)   Collection Time: 05/28/21  1:00 PM   Specimen: BLOOD  Result Value Ref Range Status   Specimen Description BLOOD LEFT AC  Final   Special Requests   Final    BOTTLES DRAWN AEROBIC AND ANAEROBIC Blood Culture adequate volume   Culture   Final    NO GROWTH < 24 HOURS Performed at [facility], [address], [place], [place] [address]    Report Status PENDING  Incomplete     Time coordinating discharge: Over 30 minutes  SIGNED:   Wyvonnia Dusky, MD  [facility] 05/29/2021, 11:40 AM Pager   If 7PM-7AM, please contact night-coverage

## 2021-05-29 NOTE — Discharge Instructions (Addendum)
Fall Prevention in the Home, Adult Falls can cause injuries and can happen to people of all ages. There are many things you can do to make your home safe and to help prevent falls. Ask for help when making these changes. What actions can I take to prevent falls? General Instructions  Use good lighting in all rooms. Replace any light bulbs that burn out.  Turn on the lights in dark areas. Use night-lights.  Keep items that you use often in easy-to-reach places. Lower the shelves around your home if needed.  Set up your furniture so you have a clear path. Avoid moving your furniture around.  Do not have throw rugs or other things on the floor that can make you trip.  Avoid walking on wet floors.  If any of your floors are uneven, fix them.  Add color or contrast paint or tape to clearly mark and help you see: ? Grab bars or handrails. ? First and last steps of staircases. ? Where the edge of each step is.  If you use a stepladder: ? Make sure that it is fully opened. Do not climb a closed stepladder. ? Make sure the sides of the stepladder are locked in place. ? Ask someone to hold the stepladder while you use it.  Know where your pets are when moving through your home. What can I do in the bathroom?  Keep the floor dry. Clean up any water on the floor right away.  Remove soap buildup in the tub or shower.  Use nonskid mats or decals on the floor of the tub or shower.  Attach bath mats securely with double-sided, nonslip rug tape.  If you need to sit down in the shower, use a plastic, nonslip stool.  Install grab bars by the toilet and in the tub and shower. Do not use towel bars as grab bars.      What can I do in the bedroom?  Make sure that you have a light by your bed that is easy to reach.  Do not use any sheets or blankets for your bed that hang to the floor.  Have a firm chair with side arms that you can use for support when you get dressed. What can I do in  the kitchen?  Clean up any spills right away.  If you need to reach something above you, use a step stool with a grab bar.  Keep electrical cords out of the way.  Do not use floor polish or wax that makes floors slippery. What can I do with my stairs?  Do not leave any items on the stairs.  Make sure that you have a light switch at the top and the bottom of the stairs.  Make sure that there are handrails on both sides of the stairs. Fix handrails that are broken or loose.  Install nonslip stair treads on all your stairs.  Avoid having throw rugs at the top or bottom of the stairs.  Choose a carpet that does not hide the edge of the steps on the stairs.  Check carpeting to make sure that it is firmly attached to the stairs. Fix carpet that is loose or worn. What can I do on the outside of my home?  Use bright outdoor lighting.  Fix the edges of walkways and driveways and fix any cracks.  Remove anything that might make you trip as you walk through a door, such as a raised step or threshold.  Trim any   bushes or trees on paths to your home.  Check to see if handrails are loose or broken and that both sides of all steps have handrails.  Install guardrails along the edges of any raised decks and porches.  Clear paths of anything that can make you trip, such as tools or rocks.  Have leaves, snow, or ice cleared regularly.  Use sand or salt on paths during winter.  Clean up any spills in your garage right away. This includes grease or oil spills. What other actions can I take?  Wear shoes that: ? Have a low heel. Do not wear high heels. ? Have rubber bottoms. ? Feel good on your feet and fit well. ? Are closed at the toe. Do not wear open-toe sandals.  Use tools that help you move around if needed. These include: ? Canes. ? Walkers. ? Scooters. ? Crutches.  Review your medicines with your doctor. Some medicines can make you feel dizzy. This can increase your chance  of falling. Ask your doctor what else you can do to help prevent falls. Where to find more information  Centers for Disease Control and Prevention, [employer]: http://www.wolf.info/  National Institute on Aging: http://kim-miller.com/ Contact a doctor if:  You are afraid of falling at home.  You feel weak, drowsy, or dizzy at home.  You fall at home. Summary  There are many simple things that you can do to make your home safe and to help prevent falls.  Ways to make your home safe include removing things that can make you trip and installing grab bars in the bathroom.  Ask for help when making these changes in your home. This information is not intended to replace advice given to you by your health care provider. Make sure you discuss any questions you have with your health care provider. Document Revised: 07/15/2020 Document Reviewed: 07/15/2020 Elsevier Patient Education  [place] Pneumonia, Adult Pneumonia is a lung infection that causes inflammation and the buildup of mucus and fluids in the lungs. This may cause coughing and difficulty breathing. Community-acquired pneumonia is pneumonia that develops in people who are not, and have not recently been, in a hospital or other health care facility. Usually, pneumonia develops as a result of an illness that is caused by a virus, such as the common cold and the flu (influenza). It can also be caused by bacteria or fungi. While the common cold and influenza can pass from person to person (are contagious), pneumonia itself is not considered contagious. What are the causes? This condition may be caused by:  Viruses.  Bacteria.  Fungi, such as molds or mushrooms.   What increases the risk? The following factors may make you more likely to develop this condition:  Having certain medical conditions, such as: ? A long-term (chronic) disease, which may include chronic obstructive pulmonary disease (COPD), asthma, heart  failure, cystic fibrosis, diabetes, kidney disease, sickle cell disease, and human immunodeficiency virus (HIV). ? A condition that increases the risk of breathing in (aspirating) mucus and other fluids from your mouth and nose. ? A weakened body defense system (immune system).  Having had your spleen removed (splenectomy). The spleen is the organ that helps fight germs and infections.  Not cleaning your teeth and gums well (poor dental hygiene).  Using tobacco products.  Traveling to places where germs that cause pneumonia are present.  Being near certain animals, or animal habitats, that have germs that cause pneumonia.  Being older than  63 years of age. What are the signs or symptoms? Symptoms of this condition include:  A dry cough or a wet (productive) cough.  A fever.  Sweating or chills.  Chest pain, especially when breathing deeply or coughing.  Fast breathing, difficulty breathing, or shortness of breath.  Tiredness (fatigue).  Muscle aches. How is this diagnosed? This condition may be diagnosed based on your medical history or a physical exam. You may also have tests, including:  Chest X-rays.  Tests of the level of oxygen and other gases in your blood.  Tests of: ? Your blood. ? Mucus from your lungs (sputum). ? Fluid around your lungs (pleural fluid). ? Your urine. If your pneumonia is severe, other tests may be done to learn more about the cause.   How is this treated? Treatment for this condition depends on many factors, such as the cause of your pneumonia, your medicines, and other medical conditions that you have. For most adults, pneumonia may be treated at home. In some cases, treatment must happen in a hospital and may include:  Medicines that are given by mouth (orally) or through an IV, including: ? Antibiotic medicines, if bacteria caused the pneumonia. ? Medicines that kill viruses (antiviral medicines), if a virus caused the  pneumonia.  Oxygen therapy. Severe pneumonia, although rare, may require the following treatments:  Mechanical ventilation.This procedure uses a machine to help you breathe if you cannot breathe well on your own or maintain a safe level of blood oxygen.  Thoracentesis. This procedure removes any buildup of pleural fluid to help with breathing. Follow these instructions at home: Medicines  Take over-the-counter and prescription medicines only as told by your health care provider.  Take cough medicine only if you have trouble sleeping. Cough medicine can prevent your body from removing mucus from your lungs.  If you were prescribed an antibiotic medicine, take it as told by your health care provider. Do not stop taking the antibiotic even if you start to feel better. Lifestyle  Do not drink alcohol.  Do not use any products that contain nicotine or tobacco, such as cigarettes, e-cigarettes, and chewing tobacco. If you need help quitting, ask your health care provider.  Eat a healthy diet. This includes plenty of vegetables, fruits, whole grains, low-fat dairy products, and lean protein.      General instructions  Rest a lot and get at least 8 hours of sleep each night.  Sleep in a partly upright position at night. Place a few pillows under your head or sleep in a reclining chair.  Return to your normal activities as told by your health care provider. Ask your health care provider what activities are safe for you.  Drink enough fluid to keep your urine pale yellow. This helps to thin the mucus in your lungs.  If your throat is sore, gargle with a salt-water mixture 3-4 times a day or as needed. To make a salt-water mixture, completely dissolve -1 tsp (3-6 g) of salt in 1 cup (237 mL) of warm water.  Keep all follow-up visits as told by your health care provider. This is important.   How is this prevented? You can lower your risk of developing community-acquired pneumonia  by:  Getting the pneumonia vaccine. There are different types and schedules of pneumonia vaccines. Ask your health care provider which option is best for you. Consider getting the pneumonia vaccine if: ? You are older than 63 years of age. ? You are 19-65 years of  age and are receiving cancer treatment, have chronic lung disease, or have other medical conditions that affect your immune system. Ask your health care provider if this applies to you.  Getting your influenza vaccine every year. Ask your health care provider which type of vaccine is best for you.  Getting regular dental checkups.  Washing your hands often with soap and water for at least 20 seconds. If soap and water are not available, use hand sanitizer. Contact a health care provider if you have:  A fever.  Trouble sleeping because you cannot control your cough with cough medicine. Get help right away if:  Your shortness of breath becomes worse.  Your chest pain increases.  Your sickness becomes worse, especially if you are an older adult or have a weak immune system.  You cough up blood. These symptoms may represent a serious problem that is an emergency. Do not wait to see if the symptoms will go away. Get medical help right away. Call your local emergency services (911 in the [place] Do not drive yourself to the hospital. Summary  Pneumonia is an infection of the lungs.  Community-acquired pneumonia develops in people who have not been in the hospital. It can be caused by bacteria, viruses, or fungi.  This condition may be treated with antibiotics or antiviral medicines.  Severe pneumonia may require a hospital stay and treatment to help with breathing. This information is not intended to replace advice given to you by your health care provider. Make sure you discuss any questions you have with your health care provider. Document Revised: 09/24/2019 Document Reviewed: 09/24/2019 Elsevier Patient Education  [place].

## 2021-05-29 NOTE — TOC Transition Note (Signed)
Transition of Care [facility]) - CM/SW Discharge Note   Patient Details  Name: Lovely Kerins MRN: [id number] Date of Birth: 1956/08/11  [facility] of [facility]) CM/SW Contact:  Harriet Masson, RN Phone [phone] 05/29/2021, 12:28 PM   Clinical Narrative:    Pt will be discharged today via [facility]. TOC RN has reached out to [facility] to make agency aware pt will be discharged today to secure voice Glennis Brink. Also spoke with pt and lvm with daughter Marylou Flesher  concerning pt's discharge and arrangements with [facility].  TOC team update and aware of the above arrangements.  Final next level of care: [place] Barriers to Discharge: Barriers Resolved   Patient Goals and CMS Choice        Discharge Placement                  Name of family member notified: Pt and daughter Rennis Golden Patient and family notified of of transfer: 05/29/21  Discharge Plan and Services                            [place]: Other - See comment ([facility]) Date HH Agency Contacted: 05/29/21 Time [place]: 1227 Representative spoke with at [place]: [place] Determinants of Health ([place]) Interventions     Readmission Risk Interventions No flowsheet data found.

## 2021-05-30 LAB — CULTURE, BLOOD (ROUTINE X 2)
Culture: NO GROWTH
Special Requests: ADEQUATE

## 2021-05-31 ENCOUNTER — Telehealth: Payer: Self-pay

## 2021-05-31 NOTE — Telephone Encounter (Signed)
Transition Care Management Unsuccessful Follow-up Telephone Call  Date of discharge and from where:  05/29/2021. [facility]  Attempts:  3rd Attempt  Reason for unsuccessful TCM follow-up call:  Voice mail full

## 2021-05-31 NOTE — Telephone Encounter (Signed)
Transition Care Management Unsuccessful Follow-up Telephone Call  Date of discharge and from where:  05/29/2021, [facility]  Attempts:  1st Attempt  Reason for unsuccessful TCM follow-up call:  Voice mail full

## 2021-05-31 NOTE — Telephone Encounter (Signed)
Transition Care Management Unsuccessful Follow-up Telephone Call  Date of discharge and from where:  05/29/2021, [facility]  Attempts:  2nd Attempt  Reason for unsuccessful TCM follow-up call:  Voice mail full

## 2021-06-02 ENCOUNTER — Telehealth: Payer: Self-pay | Admitting: Family Medicine

## 2021-06-02 DIAGNOSIS — Z9181 History of falling: Secondary | ICD-10-CM | POA: Diagnosis not present

## 2021-06-02 DIAGNOSIS — M6282 Rhabdomyolysis: Secondary | ICD-10-CM | POA: Diagnosis not present

## 2021-06-02 DIAGNOSIS — M6281 Muscle weakness (generalized): Secondary | ICD-10-CM | POA: Diagnosis not present

## 2021-06-02 DIAGNOSIS — J449 Chronic obstructive pulmonary disease, unspecified: Secondary | ICD-10-CM | POA: Diagnosis not present

## 2021-06-02 DIAGNOSIS — E039 Hypothyroidism, unspecified: Secondary | ICD-10-CM | POA: Diagnosis not present

## 2021-06-02 DIAGNOSIS — F17210 Nicotine dependence, cigarettes, uncomplicated: Secondary | ICD-10-CM | POA: Diagnosis not present

## 2021-06-02 LAB — CULTURE, BLOOD (ROUTINE X 2)
Culture: NO GROWTH
Culture: NO GROWTH
Special Requests: ADEQUATE
Special Requests: ADEQUATE

## 2021-06-02 NOTE — Telephone Encounter (Signed)
I have not seen the patient since her inpatient stay.  Please see what questions they have and I will try to work on it.  Thanks.

## 2021-06-02 NOTE — Telephone Encounter (Signed)
Stacy Physical Therapist call in requesting a call back from Dr. Damita Dunnings regarding patient's Medication  . [phone]

## 2021-06-03 NOTE — Telephone Encounter (Signed)
Stacy from PT stated that the ciprofloxacin was interacting with other medications and is requesting PT verbal orders. Please advise.

## 2021-06-04 ENCOUNTER — Other Ambulatory Visit: Payer: Self-pay | Admitting: Family Medicine

## 2021-06-04 DIAGNOSIS — M6281 Muscle weakness (generalized): Secondary | ICD-10-CM | POA: Diagnosis not present

## 2021-06-04 DIAGNOSIS — Z9181 History of falling: Secondary | ICD-10-CM | POA: Diagnosis not present

## 2021-06-04 DIAGNOSIS — J449 Chronic obstructive pulmonary disease, unspecified: Secondary | ICD-10-CM | POA: Diagnosis not present

## 2021-06-04 DIAGNOSIS — E039 Hypothyroidism, unspecified: Secondary | ICD-10-CM | POA: Diagnosis not present

## 2021-06-04 DIAGNOSIS — M6282 Rhabdomyolysis: Secondary | ICD-10-CM | POA: Diagnosis not present

## 2021-06-04 DIAGNOSIS — F17210 Nicotine dependence, cigarettes, uncomplicated: Secondary | ICD-10-CM | POA: Diagnosis not present

## 2021-06-04 MED ORDER — CIPROFLOXACIN HCL 500 MG PO TABS: 500.0000 mg | ORAL_TABLET | Freq: Two times a day (BID) | ORAL | Status: DC

## 2021-06-04 NOTE — Telephone Encounter (Signed)
Would stop tizanidine while on cipro.   Please give the order for PT.  Thanks.

## 2021-06-04 NOTE — Telephone Encounter (Signed)
Stacy advised as requested on her voicemail

## 2021-06-07 DIAGNOSIS — J449 Chronic obstructive pulmonary disease, unspecified: Secondary | ICD-10-CM | POA: Diagnosis not present

## 2021-06-07 DIAGNOSIS — E039 Hypothyroidism, unspecified: Secondary | ICD-10-CM | POA: Diagnosis not present

## 2021-06-07 DIAGNOSIS — F17210 Nicotine dependence, cigarettes, uncomplicated: Secondary | ICD-10-CM | POA: Diagnosis not present

## 2021-06-07 DIAGNOSIS — Z9181 History of falling: Secondary | ICD-10-CM | POA: Diagnosis not present

## 2021-06-07 DIAGNOSIS — M6282 Rhabdomyolysis: Secondary | ICD-10-CM | POA: Diagnosis not present

## 2021-06-07 DIAGNOSIS — M6281 Muscle weakness (generalized): Secondary | ICD-10-CM | POA: Diagnosis not present

## 2021-06-08 ENCOUNTER — Telehealth: Payer: Self-pay | Admitting: Family Medicine

## 2021-06-08 NOTE — Telephone Encounter (Signed)
Leslie Wong called in wanted to know if the location for the pulmologist can be change to [place] due to the transportation.

## 2021-06-09 DIAGNOSIS — Z9181 History of falling: Secondary | ICD-10-CM | POA: Diagnosis not present

## 2021-06-09 DIAGNOSIS — J449 Chronic obstructive pulmonary disease, unspecified: Secondary | ICD-10-CM | POA: Diagnosis not present

## 2021-06-09 DIAGNOSIS — E039 Hypothyroidism, unspecified: Secondary | ICD-10-CM | POA: Diagnosis not present

## 2021-06-09 DIAGNOSIS — M6281 Muscle weakness (generalized): Secondary | ICD-10-CM | POA: Diagnosis not present

## 2021-06-09 DIAGNOSIS — M6282 Rhabdomyolysis: Secondary | ICD-10-CM | POA: Diagnosis not present

## 2021-06-09 DIAGNOSIS — F17210 Nicotine dependence, cigarettes, uncomplicated: Secondary | ICD-10-CM | POA: Diagnosis not present

## 2021-06-09 NOTE — Telephone Encounter (Signed)
Attempted to call pt but unable to reach. Unable to leave VM due to mailbox being full. Will try to call back later. 

## 2021-06-09 NOTE — Telephone Encounter (Signed)
I will check with pulmonary about this.    To Eric Form- please let me know if the lung cancer screening program can be done close to patient's home.  Many thanks.

## 2021-06-09 NOTE — Telephone Encounter (Signed)
Leslie Wong, can we send this patient to [facility] for her screening scans? She needs to be in [place].  Triage, please let patient know we will send her to the [facility] program for screening

## 2021-06-10 NOTE — Telephone Encounter (Signed)
Dr. Damita Dunnings.  Does this message refer to Leslie Wong or Leslie Wong? Both patients are on this thread. .It seems that Leslie Wong  is requesting a pulmonary appointment/ Consult.  If that is the case, there is a Music therapist office at [employer] .   If this is pertaining to Leslie Wong for lung cancer screening or a pulmonary consult, let us know. She could be seen at [facility]  by the screening team there. Or through the [facility] Pulmonary office if there is a pulmonary consult need.   Thanks

## 2021-06-13 NOTE — Telephone Encounter (Signed)
My understanding was that Leslie Wong was calling in on behalf of Leslie Wong about moving the appointment.  It should be a referral for [name] about lung cancer screening.  I would appreciate it if this could be done as close to patient's home as possible.  Thanks.

## 2021-06-15 DIAGNOSIS — G20 Parkinson's disease: Secondary | ICD-10-CM | POA: Diagnosis not present

## 2021-06-15 DIAGNOSIS — T380X5A Adverse effect of glucocorticoids and synthetic analogues, initial encounter: Secondary | ICD-10-CM | POA: Diagnosis not present

## 2021-06-15 DIAGNOSIS — R0602 Shortness of breath: Secondary | ICD-10-CM | POA: Insufficient documentation

## 2021-06-15 DIAGNOSIS — K3184 Gastroparesis: Secondary | ICD-10-CM | POA: Diagnosis not present

## 2021-06-15 DIAGNOSIS — M545 Low back pain, unspecified: Secondary | ICD-10-CM | POA: Diagnosis not present

## 2021-06-15 DIAGNOSIS — E039 Hypothyroidism, unspecified: Secondary | ICD-10-CM | POA: Diagnosis not present

## 2021-06-15 DIAGNOSIS — R0902 Hypoxemia: Secondary | ICD-10-CM | POA: Diagnosis not present

## 2021-06-15 DIAGNOSIS — M6281 Muscle weakness (generalized): Secondary | ICD-10-CM | POA: Diagnosis not present

## 2021-06-15 DIAGNOSIS — Z23 Encounter for immunization: Secondary | ICD-10-CM | POA: Diagnosis not present

## 2021-06-15 DIAGNOSIS — K219 Gastro-esophageal reflux disease without esophagitis: Secondary | ICD-10-CM | POA: Diagnosis not present

## 2021-06-15 DIAGNOSIS — K589 Irritable bowel syndrome without diarrhea: Secondary | ICD-10-CM | POA: Diagnosis not present

## 2021-06-15 DIAGNOSIS — Z9181 History of falling: Secondary | ICD-10-CM | POA: Diagnosis not present

## 2021-06-15 DIAGNOSIS — E43 Unspecified severe protein-calorie malnutrition: Secondary | ICD-10-CM | POA: Diagnosis not present

## 2021-06-15 DIAGNOSIS — E785 Hyperlipidemia, unspecified: Secondary | ICD-10-CM | POA: Diagnosis not present

## 2021-06-15 DIAGNOSIS — G8929 Other chronic pain: Secondary | ICD-10-CM | POA: Diagnosis not present

## 2021-06-15 DIAGNOSIS — J441 Chronic obstructive pulmonary disease with (acute) exacerbation: Secondary | ICD-10-CM | POA: Diagnosis not present

## 2021-06-15 DIAGNOSIS — Z7951 Long term (current) use of inhaled steroids: Secondary | ICD-10-CM | POA: Diagnosis not present

## 2021-06-15 DIAGNOSIS — K449 Diaphragmatic hernia without obstruction or gangrene: Secondary | ICD-10-CM | POA: Diagnosis not present

## 2021-06-15 DIAGNOSIS — D72829 Elevated white blood cell count, unspecified: Secondary | ICD-10-CM | POA: Diagnosis not present

## 2021-06-15 DIAGNOSIS — R61 Generalized hyperhidrosis: Secondary | ICD-10-CM | POA: Diagnosis not present

## 2021-06-15 DIAGNOSIS — R059 Cough, unspecified: Secondary | ICD-10-CM | POA: Diagnosis not present

## 2021-06-15 DIAGNOSIS — Z20822 Contact with and (suspected) exposure to covid-19: Secondary | ICD-10-CM | POA: Diagnosis not present

## 2021-06-15 DIAGNOSIS — J449 Chronic obstructive pulmonary disease, unspecified: Secondary | ICD-10-CM | POA: Diagnosis not present

## 2021-06-15 DIAGNOSIS — M6282 Rhabdomyolysis: Secondary | ICD-10-CM | POA: Diagnosis not present

## 2021-06-15 DIAGNOSIS — R0600 Dyspnea, unspecified: Secondary | ICD-10-CM | POA: Diagnosis not present

## 2021-06-15 DIAGNOSIS — F17210 Nicotine dependence, cigarettes, uncomplicated: Secondary | ICD-10-CM | POA: Diagnosis not present

## 2021-06-15 DIAGNOSIS — Z882 Allergy status to sulfonamides status: Secondary | ICD-10-CM | POA: Diagnosis not present

## 2021-06-16 DIAGNOSIS — F17200 Nicotine dependence, unspecified, uncomplicated: Secondary | ICD-10-CM | POA: Insufficient documentation

## 2021-06-18 DIAGNOSIS — Z9181 History of falling: Secondary | ICD-10-CM | POA: Diagnosis not present

## 2021-06-18 DIAGNOSIS — E039 Hypothyroidism, unspecified: Secondary | ICD-10-CM | POA: Diagnosis not present

## 2021-06-18 DIAGNOSIS — J449 Chronic obstructive pulmonary disease, unspecified: Secondary | ICD-10-CM | POA: Diagnosis not present

## 2021-06-18 DIAGNOSIS — M6282 Rhabdomyolysis: Secondary | ICD-10-CM | POA: Diagnosis not present

## 2021-06-18 DIAGNOSIS — M6281 Muscle weakness (generalized): Secondary | ICD-10-CM | POA: Diagnosis not present

## 2021-06-18 DIAGNOSIS — F17210 Nicotine dependence, cigarettes, uncomplicated: Secondary | ICD-10-CM | POA: Diagnosis not present

## 2021-06-21 ENCOUNTER — Telehealth: Payer: Self-pay | Admitting: Family Medicine

## 2021-06-21 DIAGNOSIS — F17210 Nicotine dependence, cigarettes, uncomplicated: Secondary | ICD-10-CM | POA: Diagnosis not present

## 2021-06-21 DIAGNOSIS — M6281 Muscle weakness (generalized): Secondary | ICD-10-CM | POA: Diagnosis not present

## 2021-06-21 DIAGNOSIS — Z9181 History of falling: Secondary | ICD-10-CM | POA: Diagnosis not present

## 2021-06-21 DIAGNOSIS — J449 Chronic obstructive pulmonary disease, unspecified: Secondary | ICD-10-CM | POA: Diagnosis not present

## 2021-06-21 DIAGNOSIS — M6282 Rhabdomyolysis: Secondary | ICD-10-CM | POA: Diagnosis not present

## 2021-06-21 DIAGNOSIS — E039 Hypothyroidism, unspecified: Secondary | ICD-10-CM | POA: Diagnosis not present

## 2021-06-21 NOTE — Telephone Encounter (Signed)
Leslie Wong with [facility] calling for Verbal orders  Extend PT to once a week x 4 weeks.

## 2021-06-22 NOTE — Telephone Encounter (Signed)
Verbal orders given to [facility] with [facility].

## 2021-06-22 NOTE — Telephone Encounter (Signed)
Please give the order.  Thanks.   

## 2021-06-23 NOTE — Telephone Encounter (Signed)
Previous staff message did not appear in this thread.  I pasted them below with routing information.  I was not contacted about reevaluating her on the message from 9 days ago.  Janett Billow - Please check with patient about current respiratory symptoms.  She was admitted to the hospital on 06/15/2021.  It likely makes sense to push her screening back for 6 weeks.  I thank all involved. Leslie Wong   ==========================================     [place], Shawn P, RN  You Yesterday (3:42 PM)   Hey Dr. Damita Dunnings, not sure if this went to you in the message thread from the 20th but Leslie Wong thinks she still has active respiratory infection. If so, we should wait to do lung screening scan until after that resolves.  Thanks,  Shawn   ==========================================    [place], Antelope, RN  Doroteo Glassman D, RN 9 days ago   I spoke with Leslie Wong and she thinks she still has pneumonia. Dr. Damita Dunnings, do you want to evaluate her prior to her lung screening scan? I would hate to do it just to prove that the pneumonia she had has not cleared up.    ==========================================  Christie Beckers, RN routed conversation to Lieutenant Diego, RN 9 days ago   Dara Lords, Waldemar Dickens, RN  You; Magdalen Spatz, NP; Christie Beckers, RN 9 days ago   [place], [place] contact them today. thanks

## 2021-06-24 DIAGNOSIS — M25569 Pain in unspecified knee: Secondary | ICD-10-CM | POA: Diagnosis not present

## 2021-06-24 DIAGNOSIS — M5412 Radiculopathy, cervical region: Secondary | ICD-10-CM | POA: Diagnosis not present

## 2021-06-24 DIAGNOSIS — G4700 Insomnia, unspecified: Secondary | ICD-10-CM | POA: Diagnosis not present

## 2021-06-24 DIAGNOSIS — M542 Cervicalgia: Secondary | ICD-10-CM | POA: Diagnosis not present

## 2021-06-24 DIAGNOSIS — G894 Chronic pain syndrome: Secondary | ICD-10-CM | POA: Diagnosis not present

## 2021-06-24 DIAGNOSIS — K5900 Constipation, unspecified: Secondary | ICD-10-CM | POA: Diagnosis not present

## 2021-06-24 DIAGNOSIS — M5416 Radiculopathy, lumbar region: Secondary | ICD-10-CM | POA: Diagnosis not present

## 2021-06-25 NOTE — Telephone Encounter (Signed)
Tried to call patient but no answer and VM full

## 2021-07-02 ENCOUNTER — Telehealth: Payer: Self-pay

## 2021-07-02 DIAGNOSIS — M6282 Rhabdomyolysis: Secondary | ICD-10-CM | POA: Diagnosis not present

## 2021-07-02 DIAGNOSIS — E039 Hypothyroidism, unspecified: Secondary | ICD-10-CM | POA: Diagnosis not present

## 2021-07-02 DIAGNOSIS — J449 Chronic obstructive pulmonary disease, unspecified: Secondary | ICD-10-CM | POA: Diagnosis not present

## 2021-07-02 DIAGNOSIS — M6281 Muscle weakness (generalized): Secondary | ICD-10-CM | POA: Diagnosis not present

## 2021-07-02 DIAGNOSIS — F17210 Nicotine dependence, cigarettes, uncomplicated: Secondary | ICD-10-CM | POA: Diagnosis not present

## 2021-07-02 DIAGNOSIS — Z9181 History of falling: Secondary | ICD-10-CM | POA: Diagnosis not present

## 2021-07-02 NOTE — Telephone Encounter (Signed)
Tried to call patient no answer and VM full

## 2021-07-02 NOTE — Telephone Encounter (Signed)
This was addressed as inpt.

## 2021-07-02 NOTE — Telephone Encounter (Signed)
I am working at triage and retrieved voice mails that were left on the other triage line [phone] VM). A message was left from [facility], nurse with [place], stating pt was having night sweats, cold chills, pain all over 8/10, temp 98.0 but elevated BP at 148/90. The pt thought she may still have pneumonia and was wanting Dr Damita Dunnings to call in an antibiotic. She left her number, the pt's number [phone], and the pt's daughter's number at [phone].  Not sure how Stanton Kidney got that number as it is not the usual triage line number.   Looking at pt's chart, she was admitted to [facility] that same evening of this call.

## 2021-07-02 NOTE — Telephone Encounter (Addendum)
Is this a current issue or was this an old message?  If current issue, then needs eval.  If old issue, then this should have been addressed as inpatient.

## 2021-07-04 NOTE — Telephone Encounter (Signed)
Noted. Thanks.

## 2021-07-06 DIAGNOSIS — F17210 Nicotine dependence, cigarettes, uncomplicated: Secondary | ICD-10-CM | POA: Diagnosis not present

## 2021-07-06 DIAGNOSIS — J449 Chronic obstructive pulmonary disease, unspecified: Secondary | ICD-10-CM | POA: Diagnosis not present

## 2021-07-06 DIAGNOSIS — Z9181 History of falling: Secondary | ICD-10-CM | POA: Diagnosis not present

## 2021-07-06 DIAGNOSIS — M6281 Muscle weakness (generalized): Secondary | ICD-10-CM | POA: Diagnosis not present

## 2021-07-06 DIAGNOSIS — E039 Hypothyroidism, unspecified: Secondary | ICD-10-CM | POA: Diagnosis not present

## 2021-07-06 DIAGNOSIS — M6282 Rhabdomyolysis: Secondary | ICD-10-CM | POA: Diagnosis not present

## 2021-07-08 NOTE — Telephone Encounter (Signed)
I was not able to lvm with home phone VM full. Lvm on pt's cell number to give the office a call.

## 2021-07-12 DIAGNOSIS — Z9181 History of falling: Secondary | ICD-10-CM | POA: Diagnosis not present

## 2021-07-12 DIAGNOSIS — M6282 Rhabdomyolysis: Secondary | ICD-10-CM | POA: Diagnosis not present

## 2021-07-12 DIAGNOSIS — F17210 Nicotine dependence, cigarettes, uncomplicated: Secondary | ICD-10-CM | POA: Diagnosis not present

## 2021-07-12 DIAGNOSIS — J449 Chronic obstructive pulmonary disease, unspecified: Secondary | ICD-10-CM | POA: Diagnosis not present

## 2021-07-12 DIAGNOSIS — M6281 Muscle weakness (generalized): Secondary | ICD-10-CM | POA: Diagnosis not present

## 2021-07-12 DIAGNOSIS — E039 Hypothyroidism, unspecified: Secondary | ICD-10-CM | POA: Diagnosis not present

## 2021-07-14 DIAGNOSIS — G4700 Insomnia, unspecified: Secondary | ICD-10-CM | POA: Diagnosis not present

## 2021-07-14 DIAGNOSIS — M542 Cervicalgia: Secondary | ICD-10-CM | POA: Diagnosis not present

## 2021-07-14 DIAGNOSIS — M5416 Radiculopathy, lumbar region: Secondary | ICD-10-CM | POA: Diagnosis not present

## 2021-07-14 DIAGNOSIS — Z79891 Long term (current) use of opiate analgesic: Secondary | ICD-10-CM | POA: Diagnosis not present

## 2021-07-14 DIAGNOSIS — M25569 Pain in unspecified knee: Secondary | ICD-10-CM | POA: Diagnosis not present

## 2021-07-14 DIAGNOSIS — K5900 Constipation, unspecified: Secondary | ICD-10-CM | POA: Diagnosis not present

## 2021-07-14 DIAGNOSIS — G894 Chronic pain syndrome: Secondary | ICD-10-CM | POA: Diagnosis not present

## 2021-07-14 DIAGNOSIS — M5412 Radiculopathy, cervical region: Secondary | ICD-10-CM | POA: Diagnosis not present

## 2021-07-21 DIAGNOSIS — Z9181 History of falling: Secondary | ICD-10-CM | POA: Diagnosis not present

## 2021-07-21 DIAGNOSIS — M6282 Rhabdomyolysis: Secondary | ICD-10-CM | POA: Diagnosis not present

## 2021-07-21 DIAGNOSIS — E039 Hypothyroidism, unspecified: Secondary | ICD-10-CM | POA: Diagnosis not present

## 2021-07-21 DIAGNOSIS — J449 Chronic obstructive pulmonary disease, unspecified: Secondary | ICD-10-CM | POA: Diagnosis not present

## 2021-07-21 DIAGNOSIS — F17210 Nicotine dependence, cigarettes, uncomplicated: Secondary | ICD-10-CM | POA: Diagnosis not present

## 2021-07-21 DIAGNOSIS — M6281 Muscle weakness (generalized): Secondary | ICD-10-CM | POA: Diagnosis not present

## 2021-08-12 DIAGNOSIS — F32A Depression, unspecified: Secondary | ICD-10-CM | POA: Diagnosis not present

## 2021-08-12 DIAGNOSIS — K5900 Constipation, unspecified: Secondary | ICD-10-CM | POA: Diagnosis not present

## 2021-08-12 DIAGNOSIS — M25569 Pain in unspecified knee: Secondary | ICD-10-CM | POA: Diagnosis not present

## 2021-08-12 DIAGNOSIS — Z79899 Other long term (current) drug therapy: Secondary | ICD-10-CM | POA: Diagnosis not present

## 2021-08-12 DIAGNOSIS — M542 Cervicalgia: Secondary | ICD-10-CM | POA: Diagnosis not present

## 2021-08-12 DIAGNOSIS — G4700 Insomnia, unspecified: Secondary | ICD-10-CM | POA: Diagnosis not present

## 2021-08-12 DIAGNOSIS — Z79891 Long term (current) use of opiate analgesic: Secondary | ICD-10-CM | POA: Diagnosis not present

## 2021-08-12 DIAGNOSIS — M5412 Radiculopathy, cervical region: Secondary | ICD-10-CM | POA: Diagnosis not present

## 2021-08-12 DIAGNOSIS — G894 Chronic pain syndrome: Secondary | ICD-10-CM | POA: Diagnosis not present

## 2021-08-12 DIAGNOSIS — M5416 Radiculopathy, lumbar region: Secondary | ICD-10-CM | POA: Diagnosis not present

## 2021-08-12 DIAGNOSIS — F17203 Nicotine dependence unspecified, with withdrawal: Secondary | ICD-10-CM | POA: Diagnosis not present

## 2021-08-23 ENCOUNTER — Telehealth: Payer: Self-pay | Admitting: *Deleted

## 2021-08-23 ENCOUNTER — Other Ambulatory Visit: Payer: Self-pay | Admitting: *Deleted

## 2021-08-23 DIAGNOSIS — Z87891 Personal history of nicotine dependence: Secondary | ICD-10-CM

## 2021-08-23 DIAGNOSIS — F17210 Nicotine dependence, cigarettes, uncomplicated: Secondary | ICD-10-CM

## 2021-08-23 NOTE — Telephone Encounter (Signed)
Pt left message requesting appt for lung cancer screening study. Forwarding message to [facility] for further management and scheduling.

## 2021-08-23 NOTE — Telephone Encounter (Signed)
I have spoken with pt and scheduled her f/u low dose chest CT. Nothing further needed at this time.

## 2021-09-02 ENCOUNTER — Other Ambulatory Visit: Payer: Self-pay

## 2021-09-02 ENCOUNTER — Ambulatory Visit
Admission: RE | Admit: 2021-09-02 | Discharge: 2021-09-02 | Disposition: A | Discharge status: Home / Self Care | Payer: Medicare Other | Source: Ambulatory Visit | Attending: Acute Care | Admitting: Acute Care

## 2021-09-02 DIAGNOSIS — F17210 Nicotine dependence, cigarettes, uncomplicated: Secondary | ICD-10-CM | POA: Diagnosis not present

## 2021-09-02 DIAGNOSIS — Z87891 Personal history of nicotine dependence: Secondary | ICD-10-CM | POA: Insufficient documentation

## 2021-09-03 DIAGNOSIS — H40003 Preglaucoma, unspecified, bilateral: Secondary | ICD-10-CM | POA: Diagnosis not present

## 2021-09-03 DIAGNOSIS — H2513 Age-related nuclear cataract, bilateral: Secondary | ICD-10-CM | POA: Diagnosis not present

## 2021-09-06 DIAGNOSIS — G20 Parkinson's disease: Secondary | ICD-10-CM | POA: Diagnosis not present

## 2021-09-06 DIAGNOSIS — E039 Hypothyroidism, unspecified: Secondary | ICD-10-CM | POA: Diagnosis not present

## 2021-09-14 ENCOUNTER — Other Ambulatory Visit: Payer: Self-pay | Admitting: Family Medicine

## 2021-09-14 DIAGNOSIS — R413 Other amnesia: Secondary | ICD-10-CM | POA: Diagnosis not present

## 2021-09-14 NOTE — Telephone Encounter (Signed)
Pt called stating she needs the results of her lungs scan

## 2021-09-14 NOTE — Telephone Encounter (Signed)
Looks like results are in epic. Please advise.

## 2021-09-15 MED ORDER — VALACYCLOVIR HCL 500 MG PO TABS: 500.0000 mg | ORAL_TABLET | Freq: Every day | ORAL | 1 refills | Status: DC

## 2021-09-15 NOTE — Addendum Note (Signed)
Addended by: Tonia Ghent on: 09/15/2021 10:23 PM   Modules accepted: Orders

## 2021-09-15 NOTE — Addendum Note (Signed)
Addended by: Pilar Grammes on: 09/15/2021 05:23 PM   Modules accepted: Orders

## 2021-09-15 NOTE — Telephone Encounter (Signed)
She has lung nodules primarily in the upper lungs, findings are compatible with smoking related changes.  No sign of cancer.  Aortic Atherosclerosis and emphysema noted.   Continue annual screening with low-dose chest CT without contrast in 12 months.  She should get a call about scheduling.    Thanks.

## 2021-09-15 NOTE — Telephone Encounter (Signed)
Sent. Thanks.   

## 2021-09-15 NOTE — Telephone Encounter (Signed)
Spoke to pt's daughter, [name]. [place] also asked for a refill of Valtrex for her fever blisters she gets from stressing out. Last person to write it was not Dr Damita Dunnings. I will forward this to him.

## 2021-09-21 DIAGNOSIS — H2512 Age-related nuclear cataract, left eye: Secondary | ICD-10-CM | POA: Diagnosis not present

## 2021-09-22 ENCOUNTER — Encounter: Payer: Self-pay | Admitting: Ophthalmology

## 2021-10-04 ENCOUNTER — Ambulatory Visit: Payer: Medicare Other | Admitting: Anesthesiology

## 2021-10-04 ENCOUNTER — Ambulatory Visit
Admission: RE | Admit: 2021-10-04 | Discharge: 2021-10-04 | Disposition: A | Discharge status: Home / Self Care | Payer: Medicare Other | Attending: Ophthalmology | Admitting: Ophthalmology

## 2021-10-04 ENCOUNTER — Encounter
Admission: RE | Disposition: A | Discharge status: Home / Self Care | Payer: Self-pay | Source: Home / Self Care | Attending: Ophthalmology

## 2021-10-04 ENCOUNTER — Other Ambulatory Visit: Payer: Self-pay

## 2021-10-04 ENCOUNTER — Encounter: Payer: Self-pay | Admitting: Ophthalmology

## 2021-10-04 DIAGNOSIS — Z791 Long term (current) use of non-steroidal anti-inflammatories (NSAID): Secondary | ICD-10-CM | POA: Insufficient documentation

## 2021-10-04 DIAGNOSIS — Z79899 Other long term (current) drug therapy: Secondary | ICD-10-CM | POA: Insufficient documentation

## 2021-10-04 DIAGNOSIS — H2512 Age-related nuclear cataract, left eye: Secondary | ICD-10-CM | POA: Insufficient documentation

## 2021-10-04 DIAGNOSIS — G20 Parkinson's disease: Secondary | ICD-10-CM | POA: Insufficient documentation

## 2021-10-04 DIAGNOSIS — Z8249 Family history of ischemic heart disease and other diseases of the circulatory system: Secondary | ICD-10-CM | POA: Diagnosis not present

## 2021-10-04 DIAGNOSIS — Z8261 Family history of arthritis: Secondary | ICD-10-CM | POA: Diagnosis not present

## 2021-10-04 DIAGNOSIS — Z801 Family history of malignant neoplasm of trachea, bronchus and lung: Secondary | ICD-10-CM | POA: Insufficient documentation

## 2021-10-04 DIAGNOSIS — Z7952 Long term (current) use of systemic steroids: Secondary | ICD-10-CM | POA: Diagnosis not present

## 2021-10-04 DIAGNOSIS — F17200 Nicotine dependence, unspecified, uncomplicated: Secondary | ICD-10-CM | POA: Insufficient documentation

## 2021-10-04 DIAGNOSIS — H25812 Combined forms of age-related cataract, left eye: Secondary | ICD-10-CM | POA: Diagnosis not present

## 2021-10-04 DIAGNOSIS — Z833 Family history of diabetes mellitus: Secondary | ICD-10-CM | POA: Insufficient documentation

## 2021-10-04 HISTORY — DX: Presence of dental prosthetic device (complete) (partial): Z97.2

## 2021-10-04 HISTORY — PX: CATARACT EXTRACTION W/PHACO: SHX586

## 2021-10-04 HISTORY — DX: Motion sickness, initial encounter: T75.3XXA

## 2021-10-04 HISTORY — DX: Unspecified hearing loss, unspecified ear: H91.90

## 2021-10-04 HISTORY — DX: Chronic obstructive pulmonary disease, unspecified: J44.9

## 2021-10-04 SURGERY — PHACOEMULSIFICATION, CATARACT, WITH IOL INSERTION
Anesthesia: Monitor Anesthesia Care | Site: Eye | Laterality: Left

## 2021-10-04 MED ORDER — SIGHTPATH DOSE#1 BSS IO SOLN
INTRAOCULAR | Status: DC | PRN
  Administered 2021-10-04: 15 mL

## 2021-10-04 MED ORDER — SIGHTPATH DOSE#1 BSS IO SOLN
INTRAOCULAR | Status: DC | PRN
  Administered 2021-10-04: 83 mL via OPHTHALMIC

## 2021-10-04 MED ORDER — FENTANYL CITRATE (PF) 100 MCG/2ML IJ SOLN
INTRAMUSCULAR | Status: DC | PRN
  Administered 2021-10-04: 100 ug via INTRAVENOUS

## 2021-10-04 MED ORDER — SIGHTPATH DOSE#1 SODIUM HYALURONATE 23 MG/ML IO SOLUTION
PREFILLED_SYRINGE | INTRAOCULAR | Status: DC | PRN
  Administered 2021-10-04: .6 mL via INTRAOCULAR

## 2021-10-04 MED ORDER — MOXIFLOXACIN HCL 0.5 % OP SOLN
OPHTHALMIC | Status: DC | PRN
  Administered 2021-10-04: 0.2 mL via OPHTHALMIC

## 2021-10-04 MED ORDER — ARMC OPHTHALMIC DILATING DROPS
1.0000 "application " | OPHTHALMIC | Status: DC | PRN
  Administered 2021-10-04 (×3): 1 via OPHTHALMIC

## 2021-10-04 MED ORDER — MIDAZOLAM HCL 2 MG/2ML IJ SOLN
INTRAMUSCULAR | Status: DC | PRN
  Administered 2021-10-04: 2 mg via INTRAVENOUS

## 2021-10-04 MED ORDER — LIDOCAINE HCL (PF) 2 % IJ SOLN
INTRAOCULAR | Status: DC | PRN
  Administered 2021-10-04: 1 mL via INTRAOCULAR

## 2021-10-04 MED ORDER — SIGHTPATH DOSE#1 SODIUM HYALURONATE 10 MG/ML IO SOLUTION
PREFILLED_SYRINGE | INTRAOCULAR | Status: DC | PRN
  Administered 2021-10-04: 0.85 mL via INTRAOCULAR

## 2021-10-04 MED ORDER — LACTATED RINGERS IV SOLN: INTRAVENOUS | Status: DC

## 2021-10-04 MED ORDER — TETRACAINE HCL 0.5 % OP SOLN
1.0000 [drp] | OPHTHALMIC | Status: DC | PRN
  Administered 2021-10-04 (×3): 1 [drp] via OPHTHALMIC

## 2021-10-04 SURGICAL SUPPLY — 16 items
CANNULA ANT/CHMB 27G (MISCELLANEOUS) IMPLANT
CANNULA ANT/CHMB 27GA (MISCELLANEOUS) IMPLANT
DISSECTOR HYDRO NUCLEUS 50X22 (MISCELLANEOUS) ×2 IMPLANT
GLOVE SURG GAMMEX PI TX LF 7.5 (GLOVE) ×2 IMPLANT
GLOVE SURG SYN 8.5  E (GLOVE) ×2
GLOVE SURG SYN 8.5 E (GLOVE) ×1 IMPLANT
GLOVE SURG SYN 8.5 PF PI (GLOVE) ×1 IMPLANT
GOWN STRL REUS W/ TWL LRG LVL3 (GOWN DISPOSABLE) ×2 IMPLANT
GOWN STRL REUS W/TWL LRG LVL3 (GOWN DISPOSABLE) ×4
LENS IOL TECNIS EYHANCE 14.0 (Intraocular Lens) ×1 IMPLANT
MARKER SKIN DUAL TIP RULER LAB (MISCELLANEOUS) IMPLANT
PACK EYE AFTER SURG (MISCELLANEOUS) ×2 IMPLANT
SYR 3ML LL SCALE MARK (SYRINGE) ×2 IMPLANT
SYR TB 1ML LUER SLIP (SYRINGE) ×2 IMPLANT
WATER STERILE IRR 250ML POUR (IV SOLUTION) ×2 IMPLANT
WIPE NON LINTING 3.25X3.25 (MISCELLANEOUS) ×2 IMPLANT

## 2021-10-04 NOTE — Anesthesia Postprocedure Evaluation (Signed)
Anesthesia Post Note  Patient: Leslie Wong  Procedure(s) Performed: CATARACT EXTRACTION PHACO AND INTRAOCULAR LENS PLACEMENT (IOC) LEFT 9.46 00:48.0 (Left: Eye)     Patient location during evaluation: PACU Anesthesia Type: MAC Level of consciousness: awake and alert and oriented Pain management: satisfactory to patient Vital Signs Assessment: post-procedure vital signs reviewed and stable Respiratory status: spontaneous breathing, nonlabored ventilation and respiratory function stable Cardiovascular status: blood pressure returned to baseline and stable Postop Assessment: Adequate PO intake and No signs of nausea or vomiting Anesthetic complications: no   No notable events documented.  Raliegh Ip

## 2021-10-04 NOTE — Anesthesia Preprocedure Evaluation (Signed)
Anesthesia Evaluation  Patient identified by MRN, date of birth, ID band Patient awake    Reviewed: Allergy & Precautions, H&P , NPO status , Patient's Chart, lab work & pertinent test results  Airway Mallampati: II  TM Distance: >3 FB Neck ROM: full    Dental  (+) Upper Dentures, Edentulous Lower   Pulmonary COPD, Current Smoker and Patient abstained from smoking.,    + rhonchi        Cardiovascular Normal cardiovascular exam Rhythm:regular Rate:Normal     Neuro/Psych  Headaches, PSYCHIATRIC DISORDERS  Neuromuscular disease    GI/Hepatic   Endo/Other  Hypothyroidism   Renal/GU      Musculoskeletal   Abdominal   Peds  Hematology   Anesthesia Other Findings   Reproductive/Obstetrics                             Anesthesia Physical Anesthesia Plan  ASA: 3  Anesthesia Plan: MAC   Post-op Pain Management:    Induction:   PONV Risk Score and Plan: 2 and Treatment may vary due to age or medical condition, TIVA and Midazolam  Airway Management Planned:   Additional Equipment:   Intra-op Plan:   Post-operative Plan:   Informed Consent: I have reviewed the patients History and Physical, chart, labs and discussed the procedure including the risks, benefits and alternatives for the proposed anesthesia with the patient or authorized representative who has indicated his/her understanding and acceptance.     Dental Advisory Given  Plan Discussed with: CRNA  Anesthesia Plan Comments:         Anesthesia Quick Evaluation

## 2021-10-04 NOTE — Transfer of Care (Signed)
Immediate Anesthesia Transfer of Care Note  Patient: Leslie Wong  Procedure(s) Performed: CATARACT EXTRACTION PHACO AND INTRAOCULAR LENS PLACEMENT (IOC) LEFT 9.46 00:48.0 (Left: Eye)  Patient Location: PACU  Anesthesia Type: MAC  Level of Consciousness: awake, alert  and patient cooperative  Airway and Oxygen Therapy: Patient Spontanous Breathing and Patient connected to supplemental oxygen  Post-op Assessment: Post-op Vital signs reviewed, Patient's Cardiovascular Status Stable, Respiratory Function Stable, Patent Airway and No signs of Nausea or vomiting  Post-op Vital Signs: Reviewed and stable  Complications: No notable events documented.

## 2021-10-04 NOTE — Discharge Instructions (Addendum)

## 2021-10-04 NOTE — Op Note (Signed)
OPERATIVE NOTE  Leslie Wong [id number] 10/04/2021   PREOPERATIVE DIAGNOSIS:  Nuclear sclerotic cataract left eye.  H25.12   POSTOPERATIVE DIAGNOSIS:    Nuclear sclerotic cataract left eye.     PROCEDURE:  Phacoemusification with posterior chamber intraocular lens placement of the left eye   LENS:   Implant Name Type Inv. Item Serial No. Manufacturer Lot No. LRB No. Used Action  LENS IOL TECNIS EYHANCE 14.0 - [id number] Intraocular Lens LENS IOL TECNIS EYHANCE 14.0 [id number] JOHNSON   Left 1 Implanted      Procedure(s) with comments: CATARACT EXTRACTION PHACO AND INTRAOCULAR LENS PLACEMENT (IOC) LEFT 9.46 00:48.0 (Left) - requests early  DIB00 +14.0   ULTRASOUND TIME: 0 minutes 48 seconds.  CDE 9.46   SURGEON:  Benay Pillow, MD, MPH   ANESTHESIA:  Topical with tetracaine drops augmented with 1% preservative-free intracameral lidocaine.  ESTIMATED BLOOD LOSS: <1 mL   COMPLICATIONS:  None.   DESCRIPTION OF PROCEDURE:  The patient was identified in the holding room and transported to the operating room and placed in the supine position under the operating microscope.  The left eye was identified as the operative eye and it was prepped and draped in the usual sterile ophthalmic fashion.   A 1.0 millimeter clear-corneal paracentesis was made at the 5:00 position. 0.5 ml of preservative-free 1% lidocaine with epinephrine was injected into the anterior chamber.  The anterior chamber was filled with Healon 5 viscoelastic.  A 2.4 millimeter keratome was used to make a near-clear corneal incision at the 2:00 position.  A curvilinear capsulorrhexis was made with a cystotome and capsulorrhexis forceps.  Balanced salt solution was used to hydrodissect and hydrodelineate the nucleus.   Phacoemulsification was then used in stop and chop fashion to remove the lens nucleus and epinucleus.  The remaining cortex was then removed using the irrigation and aspiration handpiece. Healon was  then placed into the capsular bag to distend it for lens placement.  A lens was then injected into the capsular bag.  The remaining viscoelastic was aspirated.   Wounds were hydrated with balanced salt solution.  The anterior chamber was inflated to a physiologic pressure with balanced salt solution.  Intracameral vigamox 0.1 mL undiltued was injected into the eye and a drop placed onto the ocular surface.  No wound leaks were noted.  The patient was taken to the recovery room in stable condition without complications of anesthesia or surgery  Benay Pillow 10/04/2021, 10:02 AM

## 2021-10-04 NOTE — H&P (Signed)
[facility]   Primary Care Physician:  Tonia Ghent, MD Ophthalmologist: Dr. Benay Pillow  Pre-Procedure History & Physical: HPI:  Leslie Wong is a 65 y.o. female here for cataract surgery.   Past Medical History:  Diagnosis Date   Back pain    Cancer ([place])    skin   COPD (chronic obstructive pulmonary disease) (HCC)    Depression    BAD   Gastroparesis    GERD (gastroesophageal reflux disease)    HOH (hard of hearing)    Hypothyroidism    Insomnia    Migraines    Motion sickness    Parkinson disease ([place])    per [facility], dx'd 2019   Recurrent cold sores    Shoulder bursitis    Urge incontinence    Wears dentures    full upper and lower    Past Surgical History:  Procedure Laterality Date   ABDOMINAL HYSTERECTOMY  1999   total   APPENDECTOMY     ESOPHAGOGASTRODUODENOSCOPY  01/2006   negative except small hiatal hernia   ESOPHAGOGASTRODUODENOSCOPY  02/24/2015   See report   LAPAROSCOPY     for endometriosis   OVARIAN CYST REMOVAL  1990   unilateral   TONSILLECTOMY AND ADENOIDECTOMY      Prior to Admission medications   Medication Sig Start Date End Date Taking? Authorizing Provider  albuterol (VENTOLIN HFA) 108 (90 Base) MCG/ACT inhaler Inhale 2 puffs into the lungs every 6 (six) hours as needed for wheezing or shortness of breath. 04/26/21  Yes Tonia Ghent, MD  amitriptyline (ELAVIL) 25 MG tablet Take 25 mg by mouth at bedtime as needed for sleep (ORIGINAL ESP:QZRA 1 TO 2 TABLETS BY MOUTH AT BEDTIME FOR SLEEP OR PAIN).   Yes [provider]  budesonide-formoterol (SYMBICORT) 80-4.5 MCG/ACT inhaler Inhale 2 puffs into the lungs 2 (two) times daily. Rinse after use. 04/26/21  Yes Tonia Ghent, MD  buPROPion [facility] SR) 150 MG 12 hr tablet Take 150 mg by mouth daily.   Yes [provider]  Cholecalciferol 25 MCG (1000 UT) capsule Take 1,000 Units by mouth daily.   Yes [provider]  diphenhydrAMINE  (BENADRYL) 25 mg capsule Take 25 mg by mouth every 6 (six) hours as needed.   Yes [provider]  hydrOXYzine (ATARAX/VISTARIL) 50 MG tablet Take 1 tablet (50 mg total) by mouth 3 (three) times daily as needed for anxiety. 12/29/20  Yes [name], [name], MD  levothyroxine (SYNTHROID) 88 MCG tablet TAKE 1 TABLET(88 MCG) BY MOUTH DAILY AT 6 AM 04/26/21  Yes Tonia Ghent, MD  mineral oil-hydrophilic petrolatum (AQUAPHOR) ointment Apply topically daily as needed for dry skin. 12/29/20  Yes [name], [name], MD  Multiple Vitamins-Minerals (MULTIVITAMIN ADULTS PO) Take by mouth.   Yes [provider]  OLANZapine (ZYPREXA) 15 MG tablet Take 1 tablet (15 mg total) by mouth at bedtime. 12/29/20  Yes [name], [name], MD  Omega-3 Fatty Acids (FISH OIL) 1000 MG CAPS Take by mouth daily.   Yes [provider]  omeprazole (PRILOSEC) 20 MG capsule Take 20 mg by mouth daily.   Yes [provider]  ondansetron (ZOFRAN-ODT) 4 MG disintegrating tablet Take 4 mg by mouth 2 (two) times daily. 05/12/21  Yes [provider]  Oxybutynin Chloride (GELNIQUE) 10 % GEL Place onto the skin.   Yes [provider]  Oxycodone HCl 10 MG TABS Take 10 mg by mouth every 6 (six) hours  as needed for pain. 05/12/21  Yes [provider]  pregabalin (LYRICA) 150 MG capsule Take 150 mg by mouth 3 (three) times daily. 04/14/21  Yes [provider]  tiZANidine (ZANAFLEX) 4 MG tablet Take 4 mg by mouth 3 (three) times daily as needed. 04/14/21  Yes [provider]  valACYclovir (VALTREX) 500 MG tablet Take 1 tablet (500 mg total) by mouth daily. 09/15/21  Yes Tonia Ghent, MD  mirtazapine (REMERON) 15 MG tablet Take 1 tablet (15 mg total) by mouth at bedtime. Patient not taking: Reported on 09/22/2021 12/29/20   [name], [name], MD  pantoprazole (PROTONIX) 40 MG tablet Take 1 tablet (40 mg total) by mouth daily. Patient not taking: Reported on 09/22/2021 12/30/20   [name],  [name], MD    Allergies as of 09/07/2021 - Review Complete 05/27/2021  Allergen Reaction Noted   Doxepin Other (See Comments) 12/11/2019   Valproic acid Shortness Of Breath 07/30/2015   Aspirin  06/05/2007   Atorvastatin Other (See Comments) 02/28/2019   Baclofen     Buprenorphine  04/26/2021   Duloxetine Other (See Comments) 09/30/2018   Erythromycin  06/05/2007   Gabapentin     Metoclopramide hcl  05/12/2009   Nortriptyline  11/13/2018   Nsaids Other (See Comments) 10/20/2011   Nucynta er [tapentadol hcl er]  01/11/2012   Nucynta [tapentadol]  07/24/2012   Prednisone  06/05/2007   Pregabalin  06/15/2010   Seroquel [quetiapine fumerate] Other (See Comments) 07/06/2011   Sulfa antibiotics Nausea And Vomiting 05/26/2011   Topamax Other (See Comments) 09/28/2011   Vicodin [hydrocodone-acetaminophen]  04/23/2013    Family History  Problem Relation Age of Onset   Hypertension Mother    Arthritis Mother    Diabetes Mother    Stroke Mother    Cancer Father        lung   Colon cancer Neg Hx    Breast cancer Neg Hx     Social History   Socioeconomic History   Marital status: Single    Spouse name: Not on file   Number of children: Not on file   Years of education: Not on file   Highest education level: Not on file  Occupational History   Not on file  Tobacco Use   Smoking status: Some Days    Packs/day: 0.25    Years: 42.50    Pack years: 10.63    Types: Cigarettes   Smokeless tobacco: Former   Tobacco comments:    declined  Scientific laboratory technician Use: Some days  Substance and Sexual Activity   Alcohol use: No    Alcohol/week: 0.0 standard drinks   Drug use: No   Sexual activity: Never  Other Topics Concern   Not on file  Social History Narrative   Lives alone.     Has a dog names Dixie   Social Determinants of Health   Financial Resource Strain: Not on file  Food Insecurity: Not on file  Transportation Needs: Not on file  Physical Activity: Not on  file  Stress: Not on file  Social Connections: Not on file  Intimate Partner Violence: Not on file    Review of Systems: See HPI, otherwise negative ROS  Physical Exam: BP (!) 152/95   Pulse 100   Temp 98.1 F (36.7 C) (Temporal)   Resp 16   Ht 4\' 10"  (1.473 m)   Wt 69.4 kg   SpO2 93%   BMI 31.98 kg/m  General:  Alert, cooperative in NAD Head:  Normocephalic and atraumatic. Respiratory:  Normal work of breathing. Cardiovascular:  RRR  Impression/Plan: Leslie Wong is here for cataract surgery.  Risks, benefits, limitations, and alternatives regarding cataract surgery have been reviewed with the patient.  Questions have been answered.  All parties agreeable.   Benay Pillow, MD  10/04/2021, 9:32 AM

## 2021-10-05 ENCOUNTER — Encounter: Payer: Self-pay | Admitting: Ophthalmology

## 2021-10-05 NOTE — Progress Notes (Signed)
Please call patient and let them  know their  low dose Ct was read as a Lung RADS 2: nodules that are benign in appearance and behavior with a very low likelihood of becoming a clinically active cancer due to size or lack of growth. Recommendation per radiology is for a repeat LDCT in 12 months. .Please let them  know we will order and schedule their  annual screening scan for 08/2022. Please let them  know there was notation of CAD on their  scan.  Please remind the patient  that this is a non-gated exam therefore degree or severity of disease  cannot be determined. Please have them  follow up with their PCP regarding potential risk factor modification, dietary therapy or pharmacologic therapy if clinically indicated. Pt.  is  currently on not statin therapy. Please place order for annual  screening scan for  08/2022 and fax results to PCP. Thanks so much.  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 We will keep close surveillance of this area.Please ask her if she would like referral to pulmonary to evaluate. Thanks

## 2021-10-11 ENCOUNTER — Encounter: Payer: Self-pay | Admitting: Anesthesiology

## 2021-10-11 DIAGNOSIS — F17200 Nicotine dependence, unspecified, uncomplicated: Secondary | ICD-10-CM | POA: Diagnosis not present

## 2021-10-12 ENCOUNTER — Other Ambulatory Visit: Payer: Self-pay

## 2021-10-12 ENCOUNTER — Ambulatory Visit
Admission: EM | Admit: 2021-10-12 | Discharge: 2021-10-12 | Disposition: A | Discharge status: Home / Self Care | Payer: Medicare Other | Attending: Emergency Medicine | Admitting: Emergency Medicine

## 2021-10-12 DIAGNOSIS — B9689 Other specified bacterial agents as the cause of diseases classified elsewhere: Secondary | ICD-10-CM | POA: Diagnosis not present

## 2021-10-12 DIAGNOSIS — H2511 Age-related nuclear cataract, right eye: Secondary | ICD-10-CM | POA: Diagnosis not present

## 2021-10-12 DIAGNOSIS — N760 Acute vaginitis: Secondary | ICD-10-CM | POA: Insufficient documentation

## 2021-10-12 LAB — URINALYSIS, COMPLETE (UACMP) WITH MICROSCOPIC
Bilirubin Urine: NEGATIVE
Glucose, UA: NEGATIVE mg/dL
Ketones, ur: NEGATIVE mg/dL
Leukocytes,Ua: NEGATIVE
Nitrite: NEGATIVE
Protein, ur: 30 mg/dL — AB
Specific Gravity, Urine: 1.02 (ref 1.005–1.030)
pH: 7 (ref 5.0–8.0)

## 2021-10-12 LAB — WET PREP, GENITAL
Sperm: NONE SEEN
Trich, Wet Prep: NONE SEEN
Yeast Wet Prep HPF POC: NONE SEEN

## 2021-10-12 MED ORDER — METRONIDAZOLE 500 MG PO TABS: 500.0000 mg | ORAL_TABLET | Freq: Two times a day (BID) | ORAL | 0 refills | Status: DC

## 2021-10-12 NOTE — ED Provider Notes (Signed)
[facility]    CSN: [id number] Arrival date & time: 10/12/21  1043      History   Chief Complaint Chief Complaint  Patient presents with   Dysuria    HPI Leslie Wong is a 65 y.o. female.   HPI  66 year old female here for evaluation of genitourinary complaints.  Patient reports that yesterday she developed painful urination with urinary urgency and frequency.  This was associated with low back pain, nausea, cloudiness to her urine, and vaginal burning.  She reports that last night she was up and down all night long having to void and she was also experiencing night sweats.  She denies any blood in her urine or vaginal discharge.  No recent antibiotic use, hot tub use, or sexual intercourse.  Past Medical History:  Diagnosis Date   Back pain    Cancer ([place])    skin   COPD (chronic obstructive pulmonary disease) ([place])    Depression    BAD   Gastroparesis    GERD (gastroesophageal reflux disease)    HOH (hard of hearing)    Hypothyroidism    Insomnia    Migraines    Motion sickness    Parkinson disease ([place])    per [facility], dx'd 2019   Recurrent cold sores    Shoulder bursitis    Urge incontinence    Wears dentures    full upper and lower    Patient Active Problem List   Diagnosis Date Noted   COPD (chronic obstructive pulmonary disease) ([place]) 05/26/2021   Pneumonia 05/26/2021   Pneumonia of both lungs due to infectious organism 05/25/2021   Nicotine dependence, cigarettes, uncomplicated 25/36/6440   Rhabdomyolysis 05/25/2021   Fall at home, initial encounter 05/25/2021   Bipolar affective disorder ([place]) 12/16/2020   Bipolar disorder in remission ([place]) 12/09/2020   Pulmonary nodule 06/01/2020   Weight loss 05/04/2020   Bipolar 1 disorder ([place]) 03/24/2020   Bipolar I disorder, most recent episode depressed ([place]) 01/07/2020   MDD (major depressive disorder) 01/02/2020   Depression 12/11/2019   Paranoia ([place])    Cannabis abuse  05/28/2019   Bipolar 1 disorder, mixed, severe ([place]) 05/27/2019   Opiate dependence ([place]) 04/25/2019   Cracking skin 01/27/2019   Delusion ([place]) 09/26/2018   Night sweats 05/13/2018   Pupil asymmetry 05/13/2018   DDD (degenerative disc disease), cervical 04/23/2018   Parkinson disease ([place])    HLD (hyperlipidemia) 03/10/2016   Advance care planning 03/10/2016   Cervical radiculopathy 07/30/2015   Osteopenia 04/18/2014   Medicare annual wellness visit, subsequent 03/18/2014   Cough 01/17/2013   Shoulder pain 01/17/2013   Chronic back pain 09/14/2011   Hypercalcemia 03/25/2010   INCONTINENCE, URGE 04/16/2009   CARPAL TUNNEL SYNDROME, BILATERAL 09/24/2008   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   Hypothyroidism 06/05/2007   Bipolar affective disorder, current episode hypomanic ([place]) 06/05/2007   MIGRAINE HEADACHE 06/05/2007   Gastroparesis 06/05/2007   Gastroesophageal reflux disease with hiatal hernia 06/05/2007   Irritable bowel syndrome 06/05/2007   FIBROCYSTIC BREAST DISEASE 06/05/2007    Past Surgical History:  Procedure Laterality Date   ABDOMINAL HYSTERECTOMY  1999   total   APPENDECTOMY     CATARACT EXTRACTION W/PHACO Left 10/04/2021   Procedure: CATARACT EXTRACTION PHACO AND INTRAOCULAR LENS PLACEMENT (IOC) LEFT 9.46 00:48.0;  Surgeon: Eulogio Bear, MD;  Location: [facility];  Service: Ophthalmology;  Laterality: Left;  requests early   ESOPHAGOGASTRODUODENOSCOPY  01/2006   negative except small hiatal hernia  ESOPHAGOGASTRODUODENOSCOPY  02/24/2015   See report   LAPAROSCOPY     for endometriosis   OVARIAN CYST REMOVAL  1990   unilateral   TONSILLECTOMY AND ADENOIDECTOMY      OB History   No obstetric history on file.      Home Medications    Prior to Admission medications   Medication Sig Start Date End Date Taking? Authorizing Provider  albuterol (VENTOLIN HFA) 108 (90 Base) MCG/ACT inhaler Inhale 2 puffs into the lungs every 6 (six) hours as  needed for wheezing or shortness of breath. 04/26/21  Yes Tonia Ghent, MD  amitriptyline (ELAVIL) 25 MG tablet Take 25 mg by mouth at bedtime as needed for sleep (ORIGINAL TIR:WERX 1 TO 2 TABLETS BY MOUTH AT BEDTIME FOR SLEEP OR PAIN).   Yes [provider]  budesonide-formoterol (SYMBICORT) 80-4.5 MCG/ACT inhaler Inhale 2 puffs into the lungs 2 (two) times daily. Rinse after use. 04/26/21  Yes Tonia Ghent, MD  buPROPion (ZYBAN) 150 MG 12 hr tablet Take 150 mg by mouth every morning. 09/10/21  Yes [provider]  Cholecalciferol 25 MCG (1000 UT) capsule Take 1,000 Units by mouth daily.   Yes [provider]  diphenhydrAMINE (BENADRYL) 25 mg capsule Take 25 mg by mouth every 6 (six) hours as needed.   Yes [provider]  hydrOXYzine (ATARAX/VISTARIL) 50 MG tablet Take 1 tablet (50 mg total) by mouth 3 (three) times daily as needed for anxiety. 12/29/20  Yes [name], [name], MD  levothyroxine (SYNTHROID) 88 MCG tablet TAKE 1 TABLET(88 MCG) BY MOUTH DAILY AT 6 AM 04/26/21  Yes Tonia Ghent, MD  metroNIDAZOLE (FLAGYL) 500 MG tablet Take 1 tablet (500 mg total) by mouth 2 (two) times daily. 10/12/21  Yes Margarette Canada, NP  mineral oil-hydrophilic petrolatum (AQUAPHOR) ointment Apply topically daily as needed for dry skin. 12/29/20  Yes [name], [name], MD  Multiple Vitamins-Minerals (MULTIVITAMIN ADULTS PO) Take by mouth.   Yes [provider]  OLANZapine (ZYPREXA) 15 MG tablet Take 1 tablet (15 mg total) by mouth at bedtime. 12/29/20  Yes [name], [name], MD  Omega-3 Fatty Acids (FISH OIL) 1000 MG CAPS Take by mouth daily.   Yes [provider]  omeprazole (PRILOSEC) 20 MG capsule Take 20 mg by mouth daily.   Yes [provider]  ondansetron (ZOFRAN-ODT) 4 MG disintegrating tablet Take 4 mg by mouth 2 (two) times daily. 05/12/21  Yes [provider]  Oxybutynin Chloride (GELNIQUE) 10 % GEL Place onto the skin.   Yes [provider]  Oxycodone HCl 10 MG TABS Take 10 mg by mouth every 6 (six) hours as needed for pain. 05/12/21  Yes [provider]  pregabalin (LYRICA) 150 MG capsule Take 150 mg by mouth 3 (three) times daily. 04/14/21  Yes [provider]  tiZANidine (ZANAFLEX) 4 MG tablet Take 4 mg by mouth 3 (three) times daily as needed. 04/14/21  Yes [provider]  valACYclovir (VALTREX) 500 MG tablet Take 1 tablet (500 mg total) by mouth daily. 09/15/21  Yes Tonia Ghent, MD    Family History Family History  Problem Relation Age of Onset   Hypertension Mother    Arthritis Mother    Diabetes Mother    Stroke Mother    Cancer Father        lung   Colon cancer Neg Hx    Breast cancer Neg Hx     Social History Social History  Tobacco Use   Smoking status: Some Days    Packs/day: 0.25    Years: 42.50    Pack years: 10.63    Types: Cigarettes   Smokeless tobacco: Former   Tobacco comments:    declined  Scientific laboratory technician Use: Some days  Substance Use Topics   Alcohol use: No    Alcohol/week: 0.0 standard drinks   Drug use: No     Allergies   Doxepin, Valproic acid, Aspirin, Atorvastatin, Baclofen, Buprenorphine, Duloxetine, Erythromycin, Gabapentin, Metoclopramide hcl, Nortriptyline, Nsaids, Nucynta er [tapentadol hcl er], Nucynta [tapentadol], Prednisone, Pregabalin, Seroquel [quetiapine fumerate], Sulfa antibiotics, Topamax, and Vicodin [hydrocodone-acetaminophen]   Review of Systems Review of Systems  Constitutional:  Positive for diaphoresis. Negative for activity change, appetite change and fever.  Gastrointestinal:  Positive for abdominal distention and nausea. Negative for vomiting.  Genitourinary:  Positive for dysuria, frequency, urgency and vaginal pain. Negative for hematuria and vaginal discharge.  Musculoskeletal:  Positive for back pain.  Skin:  Negative for rash.  Hematological: Negative.   Psychiatric/Behavioral: Negative.       Physical Exam Triage Vital Signs ED Triage Vitals  Enc Vitals Group     BP 10/12/21 1154 (!) 150/102     Pulse Rate 10/12/21 1154 (!) 112     Resp 10/12/21 1154 18     Temp 10/12/21 1154 98.5 F (36.9 C)     Temp Source 10/12/21 1154 Oral     SpO2 10/12/21 1154 95 %     Weight 10/12/21 1152 155 lb (70.3 kg)     Height 10/12/21 1152 4\' 11"  (1.499 m)     Head Circumference --      Peak Flow --      Pain Score 10/12/21 1152 0     Pain Loc --      Pain Edu? --      Excl. in [place]? --    No data found.  Updated Vital Signs BP (!) 150/102 (BP Location: Left Arm)   Pulse (!) 112   Temp 98.5 F (36.9 C) (Oral)   Resp 18   Ht 4\' 11"  (1.499 m)   Wt 155 lb (70.3 kg)   SpO2 95%   BMI 31.31 kg/m   Visual Acuity Right Eye Distance:   Left Eye Distance:   Bilateral Distance:    Right Eye Near:   Left Eye Near:    Bilateral Near:     Physical Exam Vitals and nursing note reviewed.  Constitutional:      General: She is not in acute distress.    Appearance: Normal appearance. She is not ill-appearing.  HENT:     Head: Normocephalic and atraumatic.  Cardiovascular:     Rate and Rhythm: Normal rate and regular rhythm.     Pulses: Normal pulses.     Heart sounds: Normal heart sounds. No murmur heard.   No gallop.  Pulmonary:     Effort: Pulmonary effort is normal.     Breath sounds: Normal breath sounds. No wheezing, rhonchi or rales.  Abdominal:     General: There is no distension.     Tenderness: There is abdominal tenderness. There is no right CVA tenderness, left CVA tenderness, guarding or rebound.  Skin:    General: Skin is warm and dry.     Capillary Refill: Capillary refill takes less than 2 seconds.     Findings: No erythema or rash.  Neurological:     General: No focal deficit present.  Mental Status: She is alert and oriented to person, place, and time.  Psychiatric:        Mood and Affect: Mood normal.        Behavior: Behavior normal.         Thought Content: Thought content normal.        Judgment: Judgment normal.     UC Treatments / Results  Labs (all labs ordered are listed, but only abnormal results are displayed) Labs Reviewed  WET PREP, GENITAL - Abnormal; Notable for the following components:      Result Value   Clue Cells Wet Prep HPF POC PRESENT (*)    WBC, Wet Prep HPF POC FEW (*)    All other components within normal limits  URINALYSIS, COMPLETE (UACMP) WITH MICROSCOPIC - Abnormal; Notable for the following components:   Hgb urine dipstick TRACE (*)    Protein, ur 30 (*)    Bacteria, UA MANY (*)    All other components within normal limits  URINE CULTURE    EKG   Radiology No results found.  Procedures Procedures (including critical care time)  Medications Ordered in UC Medications - No data to display  Initial Impression / Assessment and Plan / UC Course  I have reviewed the triage vital signs and the nursing notes.  Pertinent labs & imaging results that were available during my care of the patient were reviewed by me and considered in my medical decision making (see chart for details).  Patient is a nontoxic-appearing 65 year old female here for evaluation of genitourinary complaints as outlined in the HPI above.  Patient's physical exam reveals a benign cardiopulmonary exam with clear lung sounds in all fields.  Patient has no CVA tenderness on exam.  Abdomen is soft with mild suprapubic tenderness on palpation.  Urinalysis was collected at triage.  Urinalysis shows trace hemoglobin, 30 protein, 11-20 squamous epithelials, and many bacteria.  No WBCs leukocyte esterase or nitrites present.  Urine appears to be a contaminated sample but will send for culture given patient's cluster of symptoms.  Will also check patient for potential vaginal infections with a wet prep given her vaginal burning.  Wet prep is positive for clue cells.  We will treat patient for bacterial vaginosis with  metronidazole twice daily for 7 days.   Final Clinical Impressions(s) / UC Diagnoses   Final diagnoses:  BV (bacterial vaginosis)     Discharge Instructions      Take the Flagyl (metronidazole) 500 mg twice daily for treatment of your bacterial vaginosis.  Avoid alcohol while on the metronidazole as taken together will cause of vomiting.  Bacterial vaginosis is often caused by a imbalance of bacteria in your vaginal vault.  This is sometimes a result of using tampons or hormonal fluctuations during her menstrual cycle.  You if your symptoms are recurrent you can try using a boric acid suppository twice weekly to help maintain the acid-base balance in your vagina vault which could prevent further infection.  You can also try vaginal probiotics to help return normal bacterial balance.      ED Prescriptions     Medication Sig Dispense Auth. Provider   metroNIDAZOLE (FLAGYL) 500 MG tablet Take 1 tablet (500 mg total) by mouth 2 (two) times daily. 14 tablet Margarette Canada, NP      PDMP not reviewed this encounter.   Margarette Canada, NP 10/12/21 1227

## 2021-10-12 NOTE — ED Triage Notes (Signed)
Pt c/o dysuria, night sweats and bad urine odor since yesterday. Pt also reports some nausea and thinks she may have had a fever.

## 2021-10-12 NOTE — Discharge Instructions (Addendum)
Take the Flagyl (metronidazole) 500 mg twice daily for treatment of your bacterial vaginosis.  Avoid alcohol while on the metronidazole as taken together will cause of vomiting.  Bacterial vaginosis is often caused by a imbalance of bacteria in your vaginal vault.  This is sometimes a result of using tampons or hormonal fluctuations during her menstrual cycle.  You if your symptoms are recurrent you can try using a boric acid suppository twice weekly to help maintain the acid-base balance in your vagina vault which could prevent further infection.  You can also try vaginal probiotics to help return normal bacterial balance.  

## 2021-10-13 LAB — URINE CULTURE

## 2021-10-20 DIAGNOSIS — G894 Chronic pain syndrome: Secondary | ICD-10-CM | POA: Diagnosis not present

## 2021-10-20 DIAGNOSIS — M25569 Pain in unspecified knee: Secondary | ICD-10-CM | POA: Diagnosis not present

## 2021-10-20 DIAGNOSIS — M542 Cervicalgia: Secondary | ICD-10-CM | POA: Diagnosis not present

## 2021-10-20 DIAGNOSIS — M5412 Radiculopathy, cervical region: Secondary | ICD-10-CM | POA: Diagnosis not present

## 2021-10-20 DIAGNOSIS — F17203 Nicotine dependence unspecified, with withdrawal: Secondary | ICD-10-CM | POA: Diagnosis not present

## 2021-10-20 DIAGNOSIS — F32A Depression, unspecified: Secondary | ICD-10-CM | POA: Diagnosis not present

## 2021-10-20 DIAGNOSIS — K5900 Constipation, unspecified: Secondary | ICD-10-CM | POA: Diagnosis not present

## 2021-10-20 DIAGNOSIS — Z79891 Long term (current) use of opiate analgesic: Secondary | ICD-10-CM | POA: Diagnosis not present

## 2021-10-20 DIAGNOSIS — M5416 Radiculopathy, lumbar region: Secondary | ICD-10-CM | POA: Diagnosis not present

## 2021-10-20 DIAGNOSIS — G4700 Insomnia, unspecified: Secondary | ICD-10-CM | POA: Diagnosis not present

## 2021-10-21 NOTE — Discharge Instructions (Addendum)

## 2021-10-25 ENCOUNTER — Other Ambulatory Visit: Payer: Self-pay

## 2021-10-25 ENCOUNTER — Ambulatory Visit: Admit: 2021-10-25 | Payer: Medicare Other | Source: Home / Self Care

## 2021-10-25 ENCOUNTER — Encounter: Payer: Self-pay | Admitting: Ophthalmology

## 2021-10-25 ENCOUNTER — Ambulatory Visit
Admission: RE | Admit: 2021-10-25 | Discharge: 2021-10-25 | Disposition: A | Discharge status: Home / Self Care | Payer: Medicare Other | Source: Ambulatory Visit | Attending: Ophthalmology | Admitting: Ophthalmology

## 2021-10-25 ENCOUNTER — Encounter
Admission: RE | Disposition: A | Discharge status: Home / Self Care | Payer: Self-pay | Source: Ambulatory Visit | Attending: Ophthalmology

## 2021-10-25 ENCOUNTER — Telehealth: Payer: Self-pay | Admitting: Family Medicine

## 2021-10-25 DIAGNOSIS — F17210 Nicotine dependence, cigarettes, uncomplicated: Secondary | ICD-10-CM | POA: Diagnosis not present

## 2021-10-25 DIAGNOSIS — K219 Gastro-esophageal reflux disease without esophagitis: Secondary | ICD-10-CM | POA: Diagnosis not present

## 2021-10-25 DIAGNOSIS — R0789 Other chest pain: Secondary | ICD-10-CM | POA: Diagnosis not present

## 2021-10-25 DIAGNOSIS — G20 Parkinson's disease: Secondary | ICD-10-CM | POA: Diagnosis not present

## 2021-10-25 DIAGNOSIS — Z79899 Other long term (current) drug therapy: Secondary | ICD-10-CM | POA: Diagnosis not present

## 2021-10-25 DIAGNOSIS — J449 Chronic obstructive pulmonary disease, unspecified: Secondary | ICD-10-CM | POA: Diagnosis not present

## 2021-10-25 DIAGNOSIS — R059 Cough, unspecified: Secondary | ICD-10-CM | POA: Diagnosis not present

## 2021-10-25 DIAGNOSIS — E039 Hypothyroidism, unspecified: Secondary | ICD-10-CM | POA: Diagnosis not present

## 2021-10-25 DIAGNOSIS — Z7951 Long term (current) use of inhaled steroids: Secondary | ICD-10-CM | POA: Diagnosis not present

## 2021-10-25 DIAGNOSIS — Z5309 Procedure and treatment not carried out because of other contraindication: Secondary | ICD-10-CM | POA: Insufficient documentation

## 2021-10-25 DIAGNOSIS — K3184 Gastroparesis: Secondary | ICD-10-CM | POA: Diagnosis not present

## 2021-10-25 DIAGNOSIS — H269 Unspecified cataract: Secondary | ICD-10-CM | POA: Diagnosis not present

## 2021-10-25 DIAGNOSIS — Z20822 Contact with and (suspected) exposure to covid-19: Secondary | ICD-10-CM | POA: Diagnosis not present

## 2021-10-25 DIAGNOSIS — E079 Disorder of thyroid, unspecified: Secondary | ICD-10-CM | POA: Diagnosis not present

## 2021-10-25 SURGERY — PHACOEMULSIFICATION, CATARACT, WITH IOL INSERTION
Anesthesia: Topical

## 2021-10-25 MED ORDER — TETRACAINE HCL 0.5 % OP SOLN
1.0000 [drp] | OPHTHALMIC | Status: DC | PRN
  Administered 2021-10-25 (×2): 1 [drp] via OPHTHALMIC

## 2021-10-25 MED ORDER — LACTATED RINGERS IV SOLN: INTRAVENOUS | Status: DC

## 2021-10-25 MED ORDER — ARMC OPHTHALMIC DILATING DROPS
1.0000 "application " | OPHTHALMIC | Status: DC | PRN
  Administered 2021-10-25 (×3): 1 via OPHTHALMIC

## 2021-10-25 SURGICAL SUPPLY — 15 items
CANNULA ANT/CHMB 27G (MISCELLANEOUS) IMPLANT
CANNULA ANT/CHMB 27GA (MISCELLANEOUS) IMPLANT
DISSECTOR HYDRO NUCLEUS 50X22 (MISCELLANEOUS) ×3 IMPLANT
GLOVE SURG GAMMEX PI TX LF 7.5 (GLOVE) ×3 IMPLANT
GLOVE SURG SYN 8.5  E (GLOVE) ×1
GLOVE SURG SYN 8.5 E (GLOVE) ×1 IMPLANT
GLOVE SURG SYN 8.5 PF PI (GLOVE) ×1 IMPLANT
GOWN STRL REUS W/ TWL LRG LVL3 (GOWN DISPOSABLE) ×4 IMPLANT
GOWN STRL REUS W/TWL LRG LVL3 (GOWN DISPOSABLE) ×4
MARKER SKIN DUAL TIP RULER LAB (MISCELLANEOUS) IMPLANT
PACK EYE AFTER SURG (MISCELLANEOUS) ×3 IMPLANT
SYR 3ML LL SCALE MARK (SYRINGE) ×3 IMPLANT
SYR TB 1ML LUER SLIP (SYRINGE) ×3 IMPLANT
WATER STERILE IRR 250ML POUR (IV SOLUTION) ×3 IMPLANT
WIPE NON LINTING 3.25X3.25 (MISCELLANEOUS) ×3 IMPLANT

## 2021-10-25 NOTE — Chronic Care Management (AMB) (Signed)
  Chronic Care Management   Outreach Note  10/25/2021 Name: Leslie Wong MRN: [id number] DOB: [birth year]  Referred by: Tonia Ghent, MD Reason for referral : No chief complaint on file.   An unsuccessful telephone outreach was attempted today. The patient was referred to the pharmacist for assistance with care management and care coordination.   Follow Up Plan:   Tatjana Dellinger Upstream Scheduler

## 2021-10-25 NOTE — Progress Notes (Signed)
Pt presented for cataract surgery.  On exam, lungs are coarse with rhonchi bilaterally.  Pt reports feeling like her lungs at "wet" recently, with increased DOE.  SpO2=95%, and otherwise stable.  Procedure canceled for COPD exacerbation vs pneumonia.  Pt and daughter will go to urgent care for treatment plan.

## 2021-10-25 NOTE — H&P (Addendum)
[facility]   Primary Care Physician:  Tonia Ghent, MD Ophthalmologist: Dr. Benay Pillow  Pre-Procedure History & Physical:   The patient was not seen.  She was evaluated by the preoperative team and was determined to be unfit for surgery today due to a COPD exacerbation.  We will reschedule.     Benay Pillow, MD  10/25/2021, 10:25 AM

## 2021-10-25 NOTE — Progress Notes (Signed)
Canceled per anesthesia see note

## 2021-10-28 ENCOUNTER — Encounter: Payer: Self-pay | Admitting: Ophthalmology

## 2021-10-29 ENCOUNTER — Telehealth: Payer: Self-pay | Admitting: Family Medicine

## 2021-10-29 NOTE — Progress Notes (Signed)
  Chronic Care Management   Outreach Note  10/29/2021 Name: Leslie Wong MRN: [id number] DOB: 25-Jul-1956  Referred by: Tonia Ghent, MD Reason for referral : No chief complaint on file.   A second unsuccessful telephone outreach was attempted today. The patient was referred to pharmacist for assistance with care management and care coordination.  Follow Up Plan:   Tatjana Dellinger Upstream Scheduler

## 2021-11-01 NOTE — Discharge Instructions (Signed)

## 2021-11-04 ENCOUNTER — Telehealth: Payer: Self-pay | Admitting: Family Medicine

## 2021-11-04 NOTE — Chronic Care Management (AMB) (Signed)
  Chronic Care Management   Outreach Note  11/04/2021 Name: Leslie Wong MRN: [id number] DOB: 1956-09-30  Referred by: Tonia Ghent, MD Reason for referral : No chief complaint on file.   Third unsuccessful telephone outreach was attempted today. The patient was referred to the pharmacist for assistance with care management and care coordination.   Follow Up Plan:   Tatjana Dellinger Upstream Scheduler

## 2021-11-08 ENCOUNTER — Encounter
Admission: RE | Disposition: A | Discharge status: Home / Self Care | Payer: Self-pay | Source: Home / Self Care | Attending: Ophthalmology

## 2021-11-08 ENCOUNTER — Ambulatory Visit
Admission: RE | Admit: 2021-11-08 | Discharge: 2021-11-08 | Disposition: A | Discharge status: Home / Self Care | Payer: Medicare Other | Attending: Ophthalmology | Admitting: Ophthalmology

## 2021-11-08 ENCOUNTER — Ambulatory Visit: Payer: Medicare Other | Admitting: Anesthesiology

## 2021-11-08 ENCOUNTER — Other Ambulatory Visit: Payer: Self-pay | Admitting: *Deleted

## 2021-11-08 ENCOUNTER — Encounter: Payer: Self-pay | Admitting: Ophthalmology

## 2021-11-08 ENCOUNTER — Other Ambulatory Visit: Payer: Self-pay

## 2021-11-08 DIAGNOSIS — H2511 Age-related nuclear cataract, right eye: Secondary | ICD-10-CM | POA: Insufficient documentation

## 2021-11-08 DIAGNOSIS — F319 Bipolar disorder, unspecified: Secondary | ICD-10-CM | POA: Diagnosis not present

## 2021-11-08 DIAGNOSIS — N329 Bladder disorder, unspecified: Secondary | ICD-10-CM | POA: Diagnosis not present

## 2021-11-08 DIAGNOSIS — F17210 Nicotine dependence, cigarettes, uncomplicated: Secondary | ICD-10-CM | POA: Insufficient documentation

## 2021-11-08 DIAGNOSIS — J449 Chronic obstructive pulmonary disease, unspecified: Secondary | ICD-10-CM | POA: Diagnosis not present

## 2021-11-08 DIAGNOSIS — F419 Anxiety disorder, unspecified: Secondary | ICD-10-CM | POA: Insufficient documentation

## 2021-11-08 DIAGNOSIS — K219 Gastro-esophageal reflux disease without esophagitis: Secondary | ICD-10-CM | POA: Insufficient documentation

## 2021-11-08 DIAGNOSIS — E039 Hypothyroidism, unspecified: Secondary | ICD-10-CM | POA: Insufficient documentation

## 2021-11-08 DIAGNOSIS — Z87891 Personal history of nicotine dependence: Secondary | ICD-10-CM

## 2021-11-08 DIAGNOSIS — H25811 Combined forms of age-related cataract, right eye: Secondary | ICD-10-CM | POA: Diagnosis not present

## 2021-11-08 HISTORY — PX: CATARACT EXTRACTION W/PHACO: SHX586

## 2021-11-08 SURGERY — PHACOEMULSIFICATION, CATARACT, WITH IOL INSERTION
Anesthesia: Monitor Anesthesia Care | Site: Eye | Laterality: Right

## 2021-11-08 MED ORDER — SIGHTPATH DOSE#1 SODIUM HYALURONATE 10 MG/ML IO SOLUTION
PREFILLED_SYRINGE | INTRAOCULAR | Status: DC | PRN
  Administered 2021-11-08: 0.85 mL via INTRAOCULAR

## 2021-11-08 MED ORDER — CEFUROXIME OPHTHALMIC INJECTION 1 MG/0.1 ML
INJECTION | OPHTHALMIC | Status: DC | PRN
  Administered 2021-11-08: 0.1 mL via OPHTHALMIC

## 2021-11-08 MED ORDER — ARMC OPHTHALMIC DILATING DROPS
1.0000 "application " | OPHTHALMIC | Status: DC | PRN
  Administered 2021-11-08 (×3): 1 via OPHTHALMIC

## 2021-11-08 MED ORDER — FENTANYL CITRATE (PF) 100 MCG/2ML IJ SOLN
INTRAMUSCULAR | Status: DC | PRN
  Administered 2021-11-08 (×2): 50 ug via INTRAVENOUS

## 2021-11-08 MED ORDER — MIDAZOLAM HCL 2 MG/2ML IJ SOLN
INTRAMUSCULAR | Status: DC | PRN
  Administered 2021-11-08 (×2): 1 mg via INTRAVENOUS

## 2021-11-08 MED ORDER — SIGHTPATH DOSE#1 BSS IO SOLN
INTRAOCULAR | Status: DC | PRN
  Administered 2021-11-08: 67 mL via OPHTHALMIC

## 2021-11-08 MED ORDER — LACTATED RINGERS IV SOLN: INTRAVENOUS | Status: DC

## 2021-11-08 MED ORDER — LIDOCAINE HCL (PF) 2 % IJ SOLN
INTRAOCULAR | Status: DC | PRN
  Administered 2021-11-08: 1 mL via INTRAOCULAR

## 2021-11-08 MED ORDER — SIGHTPATH DOSE#1 SODIUM HYALURONATE 23 MG/ML IO SOLUTION
PREFILLED_SYRINGE | INTRAOCULAR | Status: DC | PRN
  Administered 2021-11-08: 0.6 mL via INTRAOCULAR

## 2021-11-08 MED ORDER — SIGHTPATH DOSE#1 BSS IO SOLN
INTRAOCULAR | Status: DC | PRN
  Administered 2021-11-08: 15 mL

## 2021-11-08 MED ORDER — TETRACAINE HCL 0.5 % OP SOLN
1.0000 [drp] | OPHTHALMIC | Status: DC | PRN
  Administered 2021-11-08 (×3): 1 [drp] via OPHTHALMIC

## 2021-11-08 SURGICAL SUPPLY — 15 items
CANNULA ANT/CHMB 27G (MISCELLANEOUS) IMPLANT
CANNULA ANT/CHMB 27GA (MISCELLANEOUS) ×2 IMPLANT
DISSECTOR HYDRO NUCLEUS 50X22 (MISCELLANEOUS) ×2 IMPLANT
GLOVE SURG GAMMEX PI TX LF 7.5 (GLOVE) ×2 IMPLANT
GLOVE SURG SYN 8.5  E (GLOVE) ×2
GLOVE SURG SYN 8.5 E (GLOVE) ×1 IMPLANT
GLOVE SURG SYN 8.5 PF PI (GLOVE) ×1 IMPLANT
GOWN STRL REUS W/ TWL LRG LVL3 (GOWN DISPOSABLE) ×2 IMPLANT
GOWN STRL REUS W/TWL LRG LVL3 (GOWN DISPOSABLE) ×4
LENS IOL TECNIS EYHANCE 19.5 (Intraocular Lens) ×1 IMPLANT
PACK EYE AFTER SURG (MISCELLANEOUS) ×2 IMPLANT
SYR 3ML LL SCALE MARK (SYRINGE) ×2 IMPLANT
SYR TB 1ML LUER SLIP (SYRINGE) ×2 IMPLANT
WATER STERILE IRR 250ML POUR (IV SOLUTION) ×2 IMPLANT
WIPE NON LINTING 3.25X3.25 (MISCELLANEOUS) ×2 IMPLANT

## 2021-11-08 NOTE — Anesthesia Preprocedure Evaluation (Signed)
Anesthesia Evaluation  Patient identified by MRN, date of birth, ID band Patient awake    Reviewed: NPO status   History of Anesthesia Complications Negative for: history of anesthetic complications  Airway Mallampati: II  TM Distance: >3 FB Neck ROM: full    Dental  (+) Upper Dentures, Lower Dentures   Pulmonary COPD (moderate),  COPD inhaler, Current Smoker and Patient abstained from smoking.,    Pulmonary exam normal        Cardiovascular Exercise Tolerance: Good negative cardio ROS Normal cardiovascular exam     Neuro/Psych  Headaches, Anxiety Depression Bipolar Disorder Chronic pain / fibromyalgia;;ddd;   Parkinson's dz : early;    GI/Hepatic Neg liver ROS, GERD  Controlled,  Endo/Other  Hypothyroidism   Renal/GU negative Renal ROS Bladder dysfunction      Musculoskeletal  (+) Arthritis ,   Abdominal   Peds  Hematology negative hematology ROS (+)   Anesthesia Other Findings   Reproductive/Obstetrics                             Anesthesia Physical Anesthesia Plan  ASA: 3  Anesthesia Plan: MAC   Post-op Pain Management:    Induction:   PONV Risk Score and Plan: 2 and Midazolam, TIVA and Treatment may vary due to age or medical condition  Airway Management Planned:   Additional Equipment:   Intra-op Plan:   Post-operative Plan:   Informed Consent: I have reviewed the patients History and Physical, chart, labs and discussed the procedure including the risks, benefits and alternatives for the proposed anesthesia with the patient or authorized representative who has indicated his/her understanding and acceptance.       Plan Discussed with: CRNA  Anesthesia Plan Comments:         Anesthesia Quick Evaluation

## 2021-11-08 NOTE — Anesthesia Procedure Notes (Signed)
Procedure Name: MAC Date/Time: 11/08/2021 11:14 AM Performed by: Cameron Ali, CRNA Pre-anesthesia Checklist: Patient identified, Emergency Drugs available, Suction available, Timeout performed and Patient being monitored Patient Re-evaluated:Patient Re-evaluated prior to induction Oxygen Delivery Method: Nasal cannula Placement Confirmation: positive ETCO2

## 2021-11-08 NOTE — Op Note (Signed)
OPERATIVE NOTE  Leslie Wong [id number] 11/08/2021   PREOPERATIVE DIAGNOSIS:  Nuclear sclerotic cataract right eye.  H25.11   POSTOPERATIVE DIAGNOSIS:    Nuclear sclerotic cataract right eye.     PROCEDURE:  Phacoemusification with posterior chamber intraocular lens placement of the right eye   LENS:   Implant Name Type Inv. Item Serial No. Manufacturer Lot No. LRB No. Used Action  LENS IOL TECNIS EYHANCE 19.5 - [id number] Intraocular Lens LENS IOL TECNIS EYHANCE 19.5 [id number] JOHNSON   Right 1 Implanted       Procedure(s) with comments: CATARACT EXTRACTION PHACO AND INTRAOCULAR LENS PLACEMENT (IOC) RIGHT (Right) - 2.37 00:26.0  DIB00 +19.5   SURGEON:  Benay Pillow, MD, MPH  ANESTHESIOLOGIST: Anesthesiologist: Fidel Levy, MD CRNA: Cameron Ali, CRNA   ANESTHESIA:  Topical with tetracaine drops augmented with 1% preservative-free intracameral lidocaine.  ESTIMATED BLOOD LOSS: less than 1 mL.   COMPLICATIONS:  None.   DESCRIPTION OF PROCEDURE:  The patient was identified in the holding room and transported to the operating room and placed in the supine position under the operating microscope.  The right eye was identified as the operative eye and it was prepped and draped in the usual sterile ophthalmic fashion.   A 1.0 millimeter clear-corneal paracentesis was made at the 10:30 position. 0.5 ml of preservative-free 1% lidocaine with epinephrine was injected into the anterior chamber.  The anterior chamber was filled with Healon 5 viscoelastic.  A 2.4 millimeter keratome was used to make a near-clear corneal incision at the 8:00 position.  A curvilinear capsulorrhexis was made with a cystotome and capsulorrhexis forceps.  Balanced salt solution was used to hydrodissect and hydrodelineate the nucleus.   Phacoemulsification was then used in stop and chop fashion to remove the lens nucleus and epinucleus.  The remaining cortex was then removed using the irrigation  and aspiration handpiece. Healon was then placed into the capsular bag to distend it for lens placement.  A lens was then injected into the capsular bag.  The remaining viscoelastic was aspirated.   Wounds were hydrated with balanced salt solution.  The anterior chamber was inflated to a physiologic pressure with balanced salt solution.   Intracameral cefuroxime 0.1 mL at 10 mg/mL was injected into the eye.   No wound leaks were noted.  The patient was taken to the recovery room in stable condition without complications of anesthesia or surgery  Benay Pillow 11/08/2021, 11:40 AM

## 2021-11-08 NOTE — H&P (Signed)
[facility]   Primary Care Physician:  Tonia Ghent, MD Ophthalmologist: Dr. Benay Pillow  Pre-Procedure History & Physical: HPI:  Leslie Wong is a 65 y.o. female here for cataract surgery.   Past Medical History:  Diagnosis Date   Back pain    Cancer ([place])    skin   COPD (chronic obstructive pulmonary disease) (HCC)    Depression    BAD   Gastroparesis    GERD (gastroesophageal reflux disease)    HOH (hard of hearing)    Hypothyroidism    Insomnia    Migraines    Motion sickness    Parkinson disease ([place])    per [place] [facility], dx'd 2019   Recurrent cold sores    Shoulder bursitis    Urge incontinence    Wears dentures    full upper and lower    Past Surgical History:  Procedure Laterality Date   ABDOMINAL HYSTERECTOMY  1999   total   APPENDECTOMY     CATARACT EXTRACTION W/PHACO Left 10/04/2021   Procedure: CATARACT EXTRACTION PHACO AND INTRAOCULAR LENS PLACEMENT ([place]) LEFT 9.46 00:48.0;  Surgeon: Eulogio Bear, MD;  Location: [place];  Service: Ophthalmology;  Laterality: Left;  requests early   ESOPHAGOGASTRODUODENOSCOPY  01/2006   negative except small hiatal hernia   ESOPHAGOGASTRODUODENOSCOPY  02/24/2015   See report   LAPAROSCOPY     for endometriosis   OVARIAN CYST REMOVAL  1990   unilateral   TONSILLECTOMY AND ADENOIDECTOMY      Prior to Admission medications   Medication Sig Start Date End Date Taking? Authorizing Provider  albuterol (VENTOLIN HFA) 108 (90 Base) MCG/ACT inhaler Inhale 2 puffs into the lungs every 6 (six) hours as needed for wheezing or shortness of breath. 04/26/21  Yes Tonia Ghent, MD  amitriptyline (ELAVIL) 25 MG tablet Take 25 mg by mouth at bedtime as needed for sleep (ORIGINAL NLG:XQJJ 1 TO 2 TABLETS BY MOUTH AT BEDTIME FOR SLEEP OR PAIN).   Yes [provider]  budesonide-formoterol (SYMBICORT) 80-4.5 MCG/ACT inhaler Inhale 2 puffs into the lungs 2 (two) times daily. Rinse after  use. 04/26/21  Yes Tonia Ghent, MD  buPROPion (ZYBAN) 150 MG 12 hr tablet Take 150 mg by mouth every morning. 09/10/21  Yes [provider]  Cholecalciferol 25 MCG (1000 UT) capsule Take 1,000 Units by mouth daily.   Yes [provider]  hydrOXYzine (ATARAX/VISTARIL) 50 MG tablet Take 1 tablet (50 mg total) by mouth 3 (three) times daily as needed for anxiety. 12/29/20  Yes [name], [name], MD  levothyroxine (SYNTHROID) 88 MCG tablet TAKE 1 TABLET(88 MCG) BY MOUTH DAILY AT 6 AM 04/26/21  Yes Tonia Ghent, MD  metroNIDAZOLE (FLAGYL) 500 MG tablet Take 1 tablet (500 mg total) by mouth 2 (two) times daily. 10/12/21  Yes Margarette Canada, NP  mineral oil-hydrophilic petrolatum (AQUAPHOR) ointment Apply topically daily as needed for dry skin. 12/29/20  Yes [name], [name], MD  Multiple Vitamins-Minerals (MULTIVITAMIN ADULTS PO) Take by mouth.   Yes [provider]  OLANZapine (ZYPREXA) 15 MG tablet Take 1 tablet (15 mg total) by mouth at bedtime. 12/29/20  Yes [name], [name], MD  Omega-3 Fatty Acids (FISH OIL) 1000 MG CAPS Take by mouth daily.   Yes [provider]  omeprazole (PRILOSEC) 20 MG capsule Take 20 mg by mouth daily.   Yes [provider]  Oxycodone HCl 10 MG TABS Take 10 mg by mouth every  6 (six) hours as needed for pain. 05/12/21  Yes [provider]  tiZANidine (ZANAFLEX) 4 MG tablet Take 4 mg by mouth 3 (three) times daily as needed. 04/14/21  Yes [provider]  valACYclovir (VALTREX) 500 MG tablet Take 1 tablet (500 mg total) by mouth daily. 09/15/21  Yes Tonia Ghent, MD  diphenhydrAMINE (BENADRYL) 25 mg capsule Take 25 mg by mouth every 6 (six) hours as needed.    [provider]  ondansetron (ZOFRAN-ODT) 4 MG disintegrating tablet Take 4 mg by mouth 2 (two) times daily. 05/12/21   [provider]  Oxybutynin Chloride (GELNIQUE) 10 % GEL Place onto the skin.    [provider]  pregabalin (LYRICA) 150  MG capsule Take 150 mg by mouth 3 (three) times daily. Patient not taking: No sig reported 04/14/21   [provider]    Allergies as of 10/28/2021 - Review Complete 10/28/2021  Allergen Reaction Noted   Doxepin Other (See Comments) 12/11/2019   Valproic acid Shortness Of Breath 07/30/2015   Aspirin  06/05/2007   Atorvastatin Other (See Comments) 02/28/2019   Baclofen     Buprenorphine  04/26/2021   Duloxetine Other (See Comments) 09/30/2018   Erythromycin  06/05/2007   Gabapentin     Metoclopramide hcl  05/12/2009   Nortriptyline  11/13/2018   Nsaids Other (See Comments) 10/20/2011   Nucynta er [tapentadol hcl er]  01/11/2012   Nucynta [tapentadol]  07/24/2012   Prednisone  06/05/2007   Pregabalin  06/15/2010   Seroquel [quetiapine fumerate] Other (See Comments) 07/06/2011   Sulfa antibiotics Nausea And Vomiting 05/26/2011   Topamax Other (See Comments) 09/28/2011   Vicodin [hydrocodone-acetaminophen]  04/23/2013    Family History  Problem Relation Age of Onset   Hypertension Mother    Arthritis Mother    Diabetes Mother    Stroke Mother    Cancer Father        lung   Colon cancer Neg Hx    Breast cancer Neg Hx     Social History   Socioeconomic History   Marital status: Single    Spouse name: Not on file   Number of children: Not on file   Years of education: Not on file   Highest education level: Not on file  Occupational History   Not on file  Tobacco Use   Smoking status: Some Days    Packs/day: 0.25    Years: 42.50    Pack years: 10.63    Types: Cigarettes   Smokeless tobacco: Former   Tobacco comments:    declined  Scientific laboratory technician Use: Some days  Substance and Sexual Activity   Alcohol use: No    Alcohol/week: 0.0 standard drinks   Drug use: No   Sexual activity: Never  Other Topics Concern   Not on file  Social History Narrative   Lives alone.     Has a dog names Dixie   Social Determinants of Health   Financial Resource  Strain: Not on file  Food Insecurity: Not on file  Transportation Needs: Not on file  Physical Activity: Not on file  Stress: Not on file  Social Connections: Not on file  Intimate Partner Violence: Not on file    Review of Systems: See HPI, otherwise negative ROS  Physical Exam: BP (!) 141/99   Pulse 96   Temp 98.4 F (36.9 C) (Temporal)   Ht 4\' 11"  (1.499 m)   Wt 67 kg  SpO2 95%   BMI 29.81 kg/m  General:   Alert, cooperative in NAD Head:  Normocephalic and atraumatic. Respiratory:  Normal work of breathing. Cardiovascular:  RRR  Impression/Plan: Leslie Wong is here for cataract surgery.  Risks, benefits, limitations, and alternatives regarding cataract surgery have been reviewed with the patient.  Questions have been answered.  All parties agreeable.   Benay Pillow, MD  11/08/2021, 11:06 AM

## 2021-11-08 NOTE — Anesthesia Postprocedure Evaluation (Signed)
Anesthesia Post Note  Patient: Leslie Wong  Procedure(s) Performed: CATARACT EXTRACTION PHACO AND INTRAOCULAR LENS PLACEMENT (IOC) RIGHT (Right: Eye)     Patient location during evaluation: PACU Anesthesia Type: MAC Level of consciousness: awake and alert Pain management: pain level controlled Vital Signs Assessment: post-procedure vital signs reviewed and stable Respiratory status: spontaneous breathing, nonlabored ventilation, respiratory function stable and patient connected to nasal cannula oxygen Cardiovascular status: stable and blood pressure returned to baseline Postop Assessment: no apparent nausea or vomiting Anesthetic complications: no   No notable events documented.  Fidel Levy

## 2021-11-08 NOTE — Transfer of Care (Signed)
Immediate Anesthesia Transfer of Care Note  Patient: Leslie Wong  Procedure(s) Performed: CATARACT EXTRACTION PHACO AND INTRAOCULAR LENS PLACEMENT (IOC) RIGHT (Right: Eye)  Patient Location: PACU  Anesthesia Type: MAC  Level of Consciousness: awake, alert  and patient cooperative  Airway and Oxygen Therapy: Patient Spontanous Breathing and Patient connected to supplemental oxygen  Post-op Assessment: Post-op Vital signs reviewed, Patient's Cardiovascular Status Stable, Respiratory Function Stable, Patent Airway and No signs of Nausea or vomiting  Post-op Vital Signs: Reviewed and stable  Complications: No notable events documented.

## 2021-11-09 ENCOUNTER — Encounter: Payer: Self-pay | Admitting: Ophthalmology

## 2021-11-16 ENCOUNTER — Telehealth: Payer: Self-pay | Admitting: Family Medicine

## 2021-11-23 ENCOUNTER — Other Ambulatory Visit: Payer: Self-pay | Admitting: Family Medicine

## 2021-11-24 DIAGNOSIS — M545 Low back pain, unspecified: Secondary | ICD-10-CM | POA: Diagnosis not present

## 2021-11-24 DIAGNOSIS — M5416 Radiculopathy, lumbar region: Secondary | ICD-10-CM | POA: Diagnosis not present

## 2021-11-24 DIAGNOSIS — M25569 Pain in unspecified knee: Secondary | ICD-10-CM | POA: Diagnosis not present

## 2021-11-24 DIAGNOSIS — G894 Chronic pain syndrome: Secondary | ICD-10-CM | POA: Diagnosis not present

## 2021-11-24 DIAGNOSIS — K5900 Constipation, unspecified: Secondary | ICD-10-CM | POA: Diagnosis not present

## 2021-11-24 DIAGNOSIS — F17203 Nicotine dependence unspecified, with withdrawal: Secondary | ICD-10-CM | POA: Diagnosis not present

## 2021-11-24 DIAGNOSIS — F32A Depression, unspecified: Secondary | ICD-10-CM | POA: Diagnosis not present

## 2021-11-24 DIAGNOSIS — M542 Cervicalgia: Secondary | ICD-10-CM | POA: Diagnosis not present

## 2021-11-24 DIAGNOSIS — Z79891 Long term (current) use of opiate analgesic: Secondary | ICD-10-CM | POA: Diagnosis not present

## 2021-11-24 DIAGNOSIS — M5412 Radiculopathy, cervical region: Secondary | ICD-10-CM | POA: Diagnosis not present

## 2021-11-24 DIAGNOSIS — G4700 Insomnia, unspecified: Secondary | ICD-10-CM | POA: Diagnosis not present

## 2021-11-29 MED ORDER — OMEPRAZOLE 20 MG PO CPDR: DELAYED_RELEASE_CAPSULE | ORAL | 0 refills | Status: DC

## 2021-11-29 NOTE — Telephone Encounter (Signed)
Rx has been resent 

## 2021-11-29 NOTE — Addendum Note (Signed)
Addended by: Sherrilee Gilles B on: 11/29/2021 04:56 PM   Modules accepted: Orders

## 2021-11-29 NOTE — Telephone Encounter (Signed)
Pt requesting refill on Omeprazole 20 MG  be resent... the pharmacy does not have it.

## 2022-03-12 ENCOUNTER — Ambulatory Visit (INDEPENDENT_AMBULATORY_CARE_PROVIDER_SITE_OTHER): Payer: 59

## 2022-03-12 ENCOUNTER — Ambulatory Visit
Admission: EM | Admit: 2022-03-12 | Discharge: 2022-03-12 | Disposition: A | Discharge status: Home / Self Care | Payer: 59

## 2022-03-12 DIAGNOSIS — J40 Bronchitis, not specified as acute or chronic: Secondary | ICD-10-CM

## 2022-03-12 DIAGNOSIS — J441 Chronic obstructive pulmonary disease with (acute) exacerbation: Secondary | ICD-10-CM

## 2022-03-12 DIAGNOSIS — R0602 Shortness of breath: Secondary | ICD-10-CM | POA: Diagnosis not present

## 2022-03-12 DIAGNOSIS — R509 Fever, unspecified: Secondary | ICD-10-CM

## 2022-03-12 DIAGNOSIS — R062 Wheezing: Secondary | ICD-10-CM | POA: Diagnosis not present

## 2022-03-12 DIAGNOSIS — R059 Cough, unspecified: Secondary | ICD-10-CM | POA: Diagnosis not present

## 2022-03-12 MED ORDER — ALBUTEROL SULFATE (2.5 MG/3ML) 0.083% IN NEBU
2.5000 mg | INHALATION_SOLUTION | Freq: Once | RESPIRATORY_TRACT | Status: AC
  Administered 2022-03-12: 2.5 mg via RESPIRATORY_TRACT

## 2022-03-12 MED ORDER — IPRATROPIUM-ALBUTEROL 0.5-2.5 (3) MG/3ML IN SOLN: 3.0000 mL | RESPIRATORY_TRACT | 0 refills | Status: DC | PRN

## 2022-03-12 MED ORDER — PREDNISONE 20 MG PO TABS: ORAL_TABLET | ORAL | 0 refills | Status: DC

## 2022-03-12 MED ORDER — BUDESONIDE-FORMOTEROL FUMARATE 80-4.5 MCG/ACT IN AERO: INHALATION_SPRAY | RESPIRATORY_TRACT | 0 refills | Status: DC

## 2022-03-12 MED ORDER — DOXYCYCLINE HYCLATE 100 MG PO CAPS: 100.0000 mg | ORAL_CAPSULE | Freq: Two times a day (BID) | ORAL | 0 refills | Status: DC

## 2022-03-12 MED ORDER — ALBUTEROL SULFATE HFA 108 (90 BASE) MCG/ACT IN AERS: 2.0000 | INHALATION_SPRAY | Freq: Four times a day (QID) | RESPIRATORY_TRACT | 0 refills | Status: AC | PRN

## 2022-03-12 NOTE — ED Triage Notes (Signed)
Patient is here "Cough" (productive). Concerns with having "bronchitis". Symptoms for about "24 hrs". No fever. No runny nose.  ?

## 2022-03-12 NOTE — ED Provider Notes (Signed)
?[facility] ? ? ? ?CSN: [id number] ?Arrival date & time: 03/12/22  1335 ? ? ?  ? ?History   ?Chief Complaint ?Chief Complaint  ?Patient presents with  ? Cough  ? ? ?HPI ?Leslie Wong is a 66 y.o. female who presents with her daughter due to developing a productive cough with green sputum, body aches, wheezing and SOB yesterday. She is a smoker and has hx of COPD. Has been using her inhalers but are not helping her. She ran out of her symbacort yesterday.  ? ? ? ?Past Medical History:  ?Diagnosis Date  ? Back pain   ? Cancer [facility])   ? skin  ? COPD (chronic obstructive pulmonary disease) ([place])   ? Depression   ? BAD  ? Gastroparesis   ? GERD (gastroesophageal reflux disease)   ? HOH (hard of hearing)   ? Hypothyroidism   ? Insomnia   ? Migraines   ? Motion sickness   ? Parkinson disease ([place])   ? per [place] [facility], dx'd 2019  ? Recurrent cold sores   ? Shoulder bursitis   ? Urge incontinence   ? Wears dentures   ? full upper and lower  ? ? ?Patient Active Problem List  ? Diagnosis Date Noted  ? Tobacco use disorder 06/16/2021  ? Shortness of breath 06/15/2021  ? COPD (chronic obstructive pulmonary disease) ([place]) 05/26/2021  ? Pneumonia 05/26/2021  ? Pneumonia of both lungs due to infectious organism 05/25/2021  ? Nicotine dependence, cigarettes, uncomplicated 35/00/9381  ? Rhabdomyolysis 05/25/2021  ? Fall at home, initial encounter 05/25/2021  ? Bipolar affective disorder ([place]) 12/16/2020  ? Bipolar disorder in remission ([place]) 12/09/2020  ? Pulmonary nodule 06/01/2020  ? Weight loss 05/04/2020  ? Bipolar 1 disorder ([place]) 03/24/2020  ? Bipolar I disorder, most recent episode depressed ([place]) 01/07/2020  ? MDD (major depressive disorder) 01/02/2020  ? Depression 12/11/2019  ? Paranoia ([place])   ? Cannabis abuse 05/28/2019  ? Bipolar 1 disorder, mixed, severe ([place]) 05/27/2019  ? Opiate dependence ([place]) 04/25/2019  ? Cracking skin 01/27/2019  ? Delusion ([place]) 09/26/2018  ? Night sweats 05/13/2018  ?  Pupil asymmetry 05/13/2018  ? DDD (degenerative disc disease), cervical 04/23/2018  ? Parkinson disease ([place])   ? HLD (hyperlipidemia) 03/10/2016  ? Advance care planning 03/10/2016  ? Cervical radiculopathy 07/30/2015  ? Osteopenia 04/18/2014  ? Medicare annual wellness visit, subsequent 03/18/2014  ? Cough 01/17/2013  ? Shoulder pain 01/17/2013  ? Chronic back pain 09/14/2011  ? Hypercalcemia 03/25/2010  ? INCONTINENCE, URGE 04/16/2009  ? CARPAL TUNNEL SYNDROME, BILATERAL 09/24/2008  ? 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  ? Hypothyroidism 06/05/2007  ? Bipolar affective disorder, current episode hypomanic ([place]) 06/05/2007  ? MIGRAINE HEADACHE 06/05/2007  ? Gastroparesis 06/05/2007  ? Gastroesophageal reflux disease with hiatal hernia 06/05/2007  ? Irritable bowel syndrome 06/05/2007  ? FIBROCYSTIC BREAST DISEASE 06/05/2007  ? ? ?Past Surgical History:  ?Procedure Laterality Date  ? ABDOMINAL HYSTERECTOMY  1999  ? total  ? APPENDECTOMY    ? CATARACT EXTRACTION W/PHACO Left 10/04/2021  ? Procedure: CATARACT EXTRACTION PHACO AND INTRAOCULAR LENS PLACEMENT (IOC) LEFT 9.46 00:48.0;  Surgeon: Eulogio Bear, MD;  Location: [place];  Service: Ophthalmology;  Laterality: Left;  requests early  ? CATARACT EXTRACTION W/PHACO Right 11/08/2021  ? Procedure: CATARACT EXTRACTION PHACO AND INTRAOCULAR LENS PLACEMENT ([place]) RIGHT;  Surgeon: Eulogio Bear, MD;  Location: [place];  Service: Ophthalmology;  Laterality: Right;  2.37 ?00:26.0  ?  ESOPHAGOGASTRODUODENOSCOPY  01/2006  ? negative except small hiatal hernia  ? ESOPHAGOGASTRODUODENOSCOPY  02/24/2015  ? See report  ? LAPAROSCOPY    ? for endometriosis  ? OVARIAN CYST REMOVAL  1990  ? unilateral  ? TONSILLECTOMY AND ADENOIDECTOMY    ? ? ?OB History   ?No obstetric history on file. ?  ? ? ? ?Home Medications   ? ?Prior to Admission medications   ?Medication Sig Start Date End Date Taking? Authorizing Provider  ?doxycycline (VIBRAMYCIN) 100 MG capsule  Take 1 capsule (100 mg total) by mouth 2 (two) times daily. 03/12/22  Yes Rodriguez-Southworth, Sunday Spillers, PA-C  ?ipratropium-albuterol (DUONEB) 0.5-2.5 (3) MG/3ML SOLN Take 3 mLs by nebulization every 4 (four) hours as needed. For wheezing and cough 03/12/22  Yes Rodriguez-Southworth, Sunday Spillers, PA-C  ?albuterol (VENTOLIN HFA) 108 (90 Base) MCG/ACT inhaler Inhale 2 puffs into the lungs every 6 (six) hours as needed for wheezing or shortness of breath. 03/12/22   Rodriguez-Southworth, Sunday Spillers, PA-C  ?amitriptyline (ELAVIL) 25 MG tablet Take 25 mg by mouth at bedtime as needed for sleep (ORIGINAL NOB:SJGG 1 TO 2 TABLETS BY MOUTH AT BEDTIME FOR SLEEP OR PAIN).    [provider]  ?budesonide-formoterol (SYMBICORT) 80-4.5 MCG/ACT inhaler Bid 03/12/22   Rodriguez-Southworth, Sunday Spillers, PA-C  ?buprenorphine (BUTRANS) 7.5 MCG/HR 1 patch once a week. 03/07/22   [provider]  ?buPROPion (WELLBUTRIN XL) 150 MG 24 hr tablet Take 150 mg by mouth every morning. 11/19/21   [provider]  ?Cholecalciferol 25 MCG (1000 UT) capsule Take 1,000 Units by mouth daily.    [provider]  ?diphenhydrAMINE (BENADRYL) 25 mg capsule Take 25 mg by mouth every 6 (six) hours as needed.    [provider]  ?hydrOXYzine (ATARAX/VISTARIL) 50 MG tablet Take 1 tablet (50 mg total) by mouth 3 (three) times daily as needed for anxiety. 12/29/20   [name], [name], MD  ?levothyroxine (SYNTHROID) 88 MCG tablet TAKE 1 TABLET(88 MCG) BY MOUTH DAILY AT 6 AM 04/26/21   Tonia Ghent, MD  ?mineral oil-hydrophilic petrolatum (AQUAPHOR) ointment Apply topically daily as needed for dry skin. 12/29/20   [name], [name], MD  ?mirtazapine (REMERON) 15 MG tablet Take by mouth. 08/12/21   [provider]  ?mirtazapine (REMERON) 30 MG tablet Take 30 mg by mouth at bedtime. 02/23/22   [provider]  ?Multiple Vitamins-Minerals (MULTIVITAMIN ADULTS PO) Take by mouth.    [provider]  ?nicotine (NICODERM CQ -  DOSED IN MG/24 HOURS) 21 mg/24hr patch Place onto the skin. 06/17/21   [provider]  ?OLANZapine (ZYPREXA) 15 MG tablet Take by mouth. 05/10/21   [provider]  ?Omega-3 Fatty Acids (FISH OIL) 1000 MG CAPS Take by mouth daily.    [provider]  ?omeprazole (PRILOSEC) 20 MG capsule TAKE 1 CAPSULE(20 MG) BY MOUTH TWICE DAILY BEFORE A MEAL 11/29/21   Tonia Ghent, MD  ?omeprazole (PRILOSEC) 40 MG capsule Take by mouth.    [provider]  ?ondansetron (ZOFRAN-ODT) 4 MG disintegrating tablet Take 4 mg by mouth 2 (two) times daily. 05/12/21   [provider]  ?ondansetron (ZOFRAN-ODT) 8 MG disintegrating tablet Take 8 mg by mouth 2 (two) times daily. 11/24/21   [provider]  ?Oxybutynin Chloride (GELNIQUE) 10 % GEL Place onto the skin.    [provider]  ?Oxycodone HCl 10 MG TABS Take 10 mg by mouth every 6 (six) hours as needed for pain. 05/12/21   [provider]  ?prednisoLONE acetate (PRED FORTE) 1 % ophthalmic suspension Place 1 drop into the left eye 4 (four) times daily. 11/14/21   [provider]  ?predniSONE (DELTASONE) 20 MG tablet One qd x 5 days 03/12/22   Rodriguez-Southworth, Sunday Spillers, PA-C  ?pregabalin (LYRICA) 150 MG capsule Take by mouth. 04/14/21   [provider]  ?tiZANidine (ZANAFLEX) 4 MG tablet Take by mouth. 06/04/21   [provider]  ?valACYclovir (VALTREX) 500 MG tablet Take 1 tablet (500 mg total) by mouth daily. 09/15/21   Tonia Ghent, MD  ?Jeannene Patella 0.5 % ophthalmic solution  11/16/21   [provider]  ? ? ?Family History ?Family History  ?Problem Relation Age of Onset  ? Hypertension Mother   ? Arthritis Mother   ? Diabetes Mother   ? Stroke Mother   ? Cancer Father   ?     lung  ? Colon cancer Neg Hx   ? Breast cancer Neg Hx   ? ? ?Social History ?Social History  ? ?Tobacco Use  ? Smoking status: Some Days  ?  Packs/day: 0.25  ?  Years: 42.50  ?  Pack years: 10.63  ?  Types:  Cigarettes  ? Smokeless tobacco: Former  ? Tobacco comments:  ?  declined  ?Vaping Use  ? Vaping Use: Some days  ?Substance Use Topics  ? Alcohol use: No  ?  Alcohol/week: 0.0 standard drinks  ? Drug use

## 2022-03-12 NOTE — Discharge Instructions (Signed)
When you are home, use the nebulizer every 4 hours, and when not use use the albuterol inhaler for 7 days.  ?Go your see your family doctor next week to check your oxygen and listen to your lung ?

## 2022-03-28 ENCOUNTER — Telehealth: Payer: Self-pay | Admitting: *Deleted

## 2022-03-28 MED ORDER — FLUCONAZOLE 150 MG PO TABS: 150.0000 mg | ORAL_TABLET | Freq: Once | ORAL | 0 refills | Status: DC

## 2022-03-28 NOTE — Telephone Encounter (Signed)
Sent. Thanks.   

## 2022-03-28 NOTE — Telephone Encounter (Signed)
Pt was recently seen at [facility] for Bronchitis/ COPD Flare up. They prescribed her prednisone and abx, pt said she has finished med but they have caused her to have a yeast inf. Pt said she is having vaginal itching, redness and it is sore to wipe, pt said she is sure it is a yeast inf because she has had them in the past. Pt is asking if med can be sent to pharmacy for yeast inf.  ? ?[facility] [place] ?

## 2022-03-29 NOTE — Telephone Encounter (Signed)
Patient advised.

## 2022-03-29 NOTE — Telephone Encounter (Signed)
LM on VM that rx was sent. ?

## 2022-03-31 ENCOUNTER — Ambulatory Visit: Payer: 59 | Admitting: Family Medicine

## 2022-04-04 ENCOUNTER — Telehealth: Payer: Self-pay

## 2022-04-04 NOTE — Telephone Encounter (Signed)
Pt had oral surgery for implants 19 days ago. She is still having mouth pain. Asking if Dr Damita Dunnings would send in a rx for Magic Mouthwash to [facility]. I also suggested she contact the oral surgeon, also. ?

## 2022-04-05 MED ORDER — MAGIC MOUTHWASH
5.0000 mL | Freq: Three times a day (TID) | ORAL | 0 refills | Status: DC | PRN
Start: 2022-04-05 — End: 2022-05-27

## 2022-04-05 MED ORDER — MAGIC MOUTHWASH: 5.0000 mL | Freq: Three times a day (TID) | ORAL | 0 refills | Status: DC | PRN

## 2022-04-05 NOTE — Telephone Encounter (Signed)
Mickel Baas with [facility] called stating that there different ingredients that go in the Magic mouth wash. Mickel Baas would like a call back to discuss. Please advise.  ?

## 2022-04-05 NOTE — Telephone Encounter (Signed)
I printed the Rx to send, please send after I sign it.  She needs to check with the oral surgeon/dentist.  Thanks. ?

## 2022-04-05 NOTE — Telephone Encounter (Signed)
Called [facility] back and spoke with the pharmacist to see which rx this needs to be changed to. Spoke with Dr. Damita Dunnings to verify ingredients he wanted in rx that pharmacy has and relayed info back to pharmacy. They are getting rx ready for patient.  ?

## 2022-04-05 NOTE — Telephone Encounter (Signed)
Patient notified that rx was sent and advised to also contact oral surgeon/dentist. ?

## 2022-05-11 ENCOUNTER — Other Ambulatory Visit: Payer: Self-pay

## 2022-05-11 ENCOUNTER — Ambulatory Visit (INDEPENDENT_AMBULATORY_CARE_PROVIDER_SITE_OTHER): Payer: 59

## 2022-05-11 VITALS — Wt 145.0 lb

## 2022-05-11 DIAGNOSIS — Z0000 Encounter for general adult medical examination without abnormal findings: Secondary | ICD-10-CM

## 2022-05-11 DIAGNOSIS — Z1211 Encounter for screening for malignant neoplasm of colon: Secondary | ICD-10-CM | POA: Diagnosis not present

## 2022-05-11 DIAGNOSIS — Z1231 Encounter for screening mammogram for malignant neoplasm of breast: Secondary | ICD-10-CM

## 2022-05-11 NOTE — Progress Notes (Signed)
PATIENT THROWING UP AND ZOFRAN DOESN'T HELP SCHEDULED IN OFFICE  ?

## 2022-05-11 NOTE — Progress Notes (Signed)
?Virtual Visit via Telephone Note ? ?I connected with  Leslie Wong on 05/11/22 at  9:30 AM EDT by telephone and verified that I am speaking with the correct person using two identifiers. ? ?Location: ?Patient: home ?Provider: [place] ?Persons participating in the virtual visit: patient/Nurse Health Advisor ?  ?I discussed the limitations, risks, security and privacy concerns of performing an evaluation and management service by telephone and the availability of in person appointments. The patient expressed understanding and agreed to proceed. ? ?Interactive audio and video telecommunications were attempted between this nurse and patient, however failed, due to patient having technical difficulties OR patient did not have access to video capability.  We continued and completed visit with audio only. ? ?Some vital signs may be absent or patient reported.  ? ?Dionisio David, LPN ? ?Subjective:  ? Leslie Wong is a 66 y.o. female who presents for Medicare Annual (Subsequent) preventive examination. ? ?Review of Systems    ? ?  ? ?   ?Objective:  ?  ?There were no vitals filed for this visit. ?There is no height or weight on file to calculate BMI. ? ? ?  03/12/2022  ?  1:46 PM 11/08/2021  ? 10:17 AM 10/25/2021  ?  9:15 AM 10/04/2021  ?  8:38 AM 05/26/2021  ?  1:30 AM 05/25/2021  ?  3:00 PM 12/16/2020  ? 11:13 PM  ?Advanced Directives  ?Does Patient Have a Medical Advance Directive? Yes No No No  No   ?Type of Paramedic of Attorney        ?Would patient like information on creating a medical advance directive? No - Patient declined No - Patient declined No - Patient declined Yes (MAU/Ambulatory/Procedural Areas - Information given) No - Patient declined    ?  ? Information is confidential and restricted. Go to Review Flowsheets to unlock data.  ? ? ?Current Medications (verified) ?Outpatient Encounter Medications as of 05/11/2022  ?Medication Sig  ? albuterol (VENTOLIN  HFA) 108 (90 Base) MCG/ACT inhaler Inhale 2 puffs into the lungs every 6 (six) hours as needed for wheezing or shortness of breath.  ? amitriptyline (ELAVIL) 25 MG tablet Take 25 mg by mouth at bedtime as needed for sleep (ORIGINAL ZYS:AYTK 1 TO 2 TABLETS BY MOUTH AT BEDTIME FOR SLEEP OR PAIN).  ? budesonide-formoterol (SYMBICORT) 80-4.5 MCG/ACT inhaler Bid  ? buprenorphine (BUTRANS) 7.5 MCG/HR 1 patch once a week.  ? buPROPion (WELLBUTRIN XL) 150 MG 24 hr tablet Take 150 mg by mouth every morning.  ? Cholecalciferol 25 MCG (1000 UT) capsule Take 1,000 Units by mouth daily.  ? diphenhydrAMINE (BENADRYL) 25 mg capsule Take 25 mg by mouth every 6 (six) hours as needed.  ? doxycycline (VIBRAMYCIN) 100 MG capsule Take 1 capsule (100 mg total) by mouth 2 (two) times daily.  ? hydrOXYzine (ATARAX/VISTARIL) 50 MG tablet Take 1 tablet (50 mg total) by mouth 3 (three) times daily as needed for anxiety.  ? ipratropium-albuterol (DUONEB) 0.5-2.5 (3) MG/3ML SOLN Take 3 mLs by nebulization every 4 (four) hours as needed. For wheezing and cough  ? levothyroxine (SYNTHROID) 88 MCG tablet TAKE 1 TABLET(88 MCG) BY MOUTH DAILY AT 6 AM  ? magic mouthwash SOLN Take 5 mLs by mouth 3 (three) times daily as needed for mouth pain.  ? mineral oil-hydrophilic petrolatum (AQUAPHOR) ointment Apply topically daily as needed for dry skin.  ? mirtazapine (REMERON) 15 MG tablet Take by mouth.  ? mirtazapine (  REMERON) 30 MG tablet Take 30 mg by mouth at bedtime.  ? Multiple Vitamins-Minerals (MULTIVITAMIN ADULTS PO) Take by mouth.  ? nicotine (NICODERM CQ - DOSED IN MG/24 HOURS) 21 mg/24hr patch Place onto the skin.  ? OLANZapine (ZYPREXA) 15 MG tablet Take by mouth.  ? Omega-3 Fatty Acids (FISH OIL) 1000 MG CAPS Take by mouth daily.  ? omeprazole (PRILOSEC) 20 MG capsule TAKE 1 CAPSULE(20 MG) BY MOUTH TWICE DAILY BEFORE A MEAL  ? omeprazole (PRILOSEC) 40 MG capsule Take by mouth.  ? ondansetron (ZOFRAN-ODT) 4 MG disintegrating tablet Take 4 mg by  mouth 2 (two) times daily.  ? ondansetron (ZOFRAN-ODT) 8 MG disintegrating tablet Take 8 mg by mouth 2 (two) times daily.  ? Oxybutynin Chloride (GELNIQUE) 10 % GEL Place onto the skin.  ? Oxycodone HCl 10 MG TABS Take 10 mg by mouth every 6 (six) hours as needed for pain.  ? prednisoLONE acetate (PRED FORTE) 1 % ophthalmic suspension Place 1 drop into the left eye 4 (four) times daily.  ? predniSONE (DELTASONE) 20 MG tablet One qd x 5 days  ? pregabalin (LYRICA) 150 MG capsule Take by mouth.  ? tiZANidine (ZANAFLEX) 4 MG tablet Take by mouth.  ? valACYclovir (VALTREX) 500 MG tablet Take 1 tablet (500 mg total) by mouth daily.  ? VIGAMOX 0.5 % ophthalmic solution   ? ?No facility-administered encounter medications on file as of 05/11/2022.  ? ? ?Allergies (verified) ?Doxepin, Valproic acid, Aspirin, Atorvastatin, Baclofen, Buprenorphine, Duloxetine, Erythromycin, Gabapentin, Metoclopramide hcl, Nortriptyline, Nsaids, Nucynta er [tapentadol hcl er], Nucynta [tapentadol], Prednisone, Pregabalin, Seroquel [quetiapine fumerate], Sulfa antibiotics, Topamax, and Vicodin [hydrocodone-acetaminophen]  ? ?History: ?Past Medical History:  ?Diagnosis Date  ? Back pain   ? Cancer [facility])   ? skin  ? COPD (chronic obstructive pulmonary disease) ([place])   ? Depression   ? BAD  ? Gastroparesis   ? GERD (gastroesophageal reflux disease)   ? HOH (hard of hearing)   ? Hypothyroidism   ? Insomnia   ? Migraines   ? Motion sickness   ? Parkinson disease ([place])   ? per [place] [facility], dx'd 2019  ? Recurrent cold sores   ? Shoulder bursitis   ? Urge incontinence   ? Wears dentures   ? full upper and lower  ? ?Past Surgical History:  ?Procedure Laterality Date  ? ABDOMINAL HYSTERECTOMY  1999  ? total  ? APPENDECTOMY    ? CATARACT EXTRACTION W/PHACO Left 10/04/2021  ? Procedure: CATARACT EXTRACTION PHACO AND INTRAOCULAR LENS PLACEMENT ([facility]) LEFT 9.46 00:48.0;  Surgeon: Eulogio Bear, MD;  Location: [facility];  Service: Ophthalmology;   Laterality: Left;  requests early  ? CATARACT EXTRACTION W/PHACO Right 11/08/2021  ? Procedure: CATARACT EXTRACTION PHACO AND INTRAOCULAR LENS PLACEMENT ([place]) RIGHT;  Surgeon: Eulogio Bear, MD;  Location: [facility];  Service: Ophthalmology;  Laterality: Right;  2.37 ?00:26.0  ? ESOPHAGOGASTRODUODENOSCOPY  01/2006  ? negative except small hiatal hernia  ? ESOPHAGOGASTRODUODENOSCOPY  02/24/2015  ? See report  ? LAPAROSCOPY    ? for endometriosis  ? OVARIAN CYST REMOVAL  1990  ? unilateral  ? TONSILLECTOMY AND ADENOIDECTOMY    ? ?Family History  ?Problem Relation Age of Onset  ? Hypertension Mother   ? Arthritis Mother   ? Diabetes Mother   ? Stroke Mother   ? Cancer Father   ?     lung  ? Colon cancer Neg Hx   ? Breast cancer Neg  Hx   ? ?Social History  ? ?Socioeconomic History  ? Marital status: Single  ?  Spouse name: Not on file  ? Number of children: Not on file  ? Years of education: Not on file  ? Highest education level: Not on file  ?Occupational History  ? Not on file  ?Tobacco Use  ? Smoking status: Some Days  ?  Packs/day: 0.25  ?  Years: 42.50  ?  Pack years: 10.63  ?  Types: Cigarettes  ? Smokeless tobacco: Former  ? Tobacco comments:  ?  declined  ?Vaping Use  ? Vaping Use: Some days  ?Substance and Sexual Activity  ? Alcohol use: No  ?  Alcohol/week: 0.0 standard drinks  ? Drug use: No  ? Sexual activity: Never  ?Other Topics Concern  ? Not on file  ?Social History Narrative  ? Lives alone.    ? Has a dog names Dixie  ? ?Social Determinants of Health  ? ?Financial Resource Strain: Not on file  ?Food Insecurity: Not on file  ?Transportation Needs: Not on file  ?Physical Activity: Not on file  ?Stress: Not on file  ?Social Connections: Not on file  ? ? ?Tobacco Counseling ?Ready to quit: Not Answered ?Counseling given: Not Answered ?Tobacco comments: declined ? ? ?Clinical Intake: ? ?Pre-visit preparation completed: Yes ? ?  ? ?  ? ?Nutritional Risks: None ?Diabetes: No ? ?How often do you  need to have someone help you when you read instructions, pamphlets, or other written materials from your doctor or pharmacy?: 1 - Never ? ?Diabetic?no ? ?  ? ?Information entered by :: Kirke Shaggy

## 2022-05-11 NOTE — Patient Instructions (Signed)
Leslie Wong , ?Thank you for taking time to come for your Medicare Wellness Visit. I appreciate your ongoing commitment to your health goals. Please review the following plan we discussed and let me know if I can assist you in the future.  ? ?Screening recommendations/referrals: ?Colonoscopy: referral sent  ?Mammogram: referral sent ?Bone Density: 06/27/17 ?Recommended yearly ophthalmology/optometry visit for glaucoma screening and checkup ?Recommended yearly dental visit for hygiene and checkup ? ?Vaccinations: ?Influenza vaccine: n/d ?Pneumococcal vaccine: 06/17/21 ?Tdap vaccine: 06/22/17 ?Shingles vaccine: n/d  ?Covid-19: n/d ? ?Advanced directives: no ? ?Conditions/risks identified: none ? ?Next appointment: Follow up in one year for your annual wellness visit. 05/15/23 @ 9:15am by phone ? ?Preventive Care 40-64 Years, Female ?Preventive care refers to lifestyle choices and visits with your health care provider that can promote health and wellness. ?What does preventive care include? ?A yearly physical exam. This is also called an annual well check. ?Dental exams once or twice a year. ?Routine eye exams. Ask your health care provider how often you should have your eyes checked. ?Personal lifestyle choices, including: ?Daily care of your teeth and gums. ?Regular physical activity. ?Eating a healthy diet. ?Avoiding tobacco and drug use. ?Limiting alcohol use. ?Practicing safe sex. ?Taking low-dose aspirin daily starting at age 2. ?Taking vitamin and mineral supplements as recommended by your health care provider. ?What happens during an annual well check? ?The services and screenings done by your health care provider during your annual well check will depend on your age, overall health, lifestyle risk factors, and family history of disease. ?Counseling  ?Your health care provider may ask you questions about your: ?Alcohol use. ?Tobacco use. ?Drug use. ?Emotional well-being. ?Home and relationship well-being. ?Sexual  activity. ?Eating habits. ?Work and work Statistician. ?Method of birth control. ?Menstrual cycle. ?Pregnancy history. ?Screening  ?You may have the following tests or measurements: ?Height, weight, and BMI. ?Blood pressure. ?Lipid and cholesterol levels. These may be checked every 5 years, or more frequently if you are over 87 years old. ?Skin check. ?Lung cancer screening. You may have this screening every year starting at age 35 if you have a 30-pack-year history of smoking and currently smoke or have quit within the past 15 years. ?Fecal occult blood test (FOBT) of the stool. You may have this test every year starting at age 71. ?Flexible sigmoidoscopy or colonoscopy. You may have a sigmoidoscopy every 5 years or a colonoscopy every 10 years starting at age 44. ?Hepatitis C blood test. ?Hepatitis B blood test. ?Sexually transmitted disease (STD) testing. ?Diabetes screening. This is done by checking your blood sugar (glucose) after you have not eaten for a while (fasting). You may have this done every 1-3 years. ?Mammogram. This may be done every 1-2 years. Talk to your health care provider about when you should start having regular mammograms. This may depend on whether you have a family history of breast cancer. ?BRCA-related cancer screening. This may be done if you have a family history of breast, ovarian, tubal, or peritoneal cancers. ?Pelvic exam and Pap test. This may be done every 3 years starting at age 45. Starting at age 67, this may be done every 5 years if you have a Pap test in combination with an HPV test. ?Bone density scan. This is done to screen for osteoporosis. You may have this scan if you are at high risk for osteoporosis. ?Discuss your test results, treatment options, and if necessary, the need for more tests with your health care provider. ?Vaccines  ?  Your health care provider may recommend certain vaccines, such as: ?Influenza vaccine. This is recommended every year. ?Tetanus, diphtheria,  and acellular pertussis (Tdap, Td) vaccine. You may need a Td booster every 10 years. ?Zoster vaccine. You may need this after age 62. ?Pneumococcal 13-valent conjugate (PCV13) vaccine. You may need this if you have certain conditions and were not previously vaccinated. ?Pneumococcal polysaccharide (PPSV23) vaccine. You may need one or two doses if you smoke cigarettes or if you have certain conditions. ?Talk to your health care provider about which screenings and vaccines you need and how often you need them. ?This information is not intended to replace advice given to you by your health care provider. Make sure you discuss any questions you have with your health care provider. ?Document Released: 01/08/2016 Document Revised: 08/31/2016 Document Reviewed: 10/13/2015 ?Elsevier Interactive Patient Education ? 2017 Elsevier Inc. ? ? ? ?Fall Prevention in the Home ?Falls can cause injuries. They can happen to people of all ages. There are many things you can do to make your home safe and to help prevent falls. ?What can I do on the outside of my home? ?Regularly fix the edges of walkways and driveways and fix any cracks. ?Remove anything that might make you trip as you walk through a door, such as a raised step or threshold. ?Trim any bushes or trees on the path to your home. ?Use bright outdoor lighting. ?Clear any walking paths of anything that might make someone trip, such as rocks or tools. ?Regularly check to see if handrails are loose or broken. Make sure that both sides of any steps have handrails. ?Any raised decks and porches should have guardrails on the edges. ?Have any leaves, snow, or ice cleared regularly. ?Use sand or salt on walking paths during winter. ?Clean up any spills in your garage right away. This includes oil or grease spills. ?What can I do in the bathroom? ?Use night lights. ?Install grab bars by the toilet and in the tub and shower. Do not use towel bars as grab bars. ?Use non-skid mats or  decals in the tub or shower. ?If you need to sit down in the shower, use a plastic, non-slip stool. ?Keep the floor dry. Clean up any water that spills on the floor as soon as it happens. ?Remove soap buildup in the tub or shower regularly. ?Attach bath mats securely with double-sided non-slip rug tape. ?Do not have throw rugs and other things on the floor that can make you trip. ?What can I do in the bedroom? ?Use night lights. ?Make sure that you have a light by your bed that is easy to reach. ?Do not use any sheets or blankets that are too big for your bed. They should not hang down onto the floor. ?Have a firm chair that has side arms. You can use this for support while you get dressed. ?Do not have throw rugs and other things on the floor that can make you trip. ?What can I do in the kitchen? ?Clean up any spills right away. ?Avoid walking on wet floors. ?Keep items that you use a lot in easy-to-reach places. ?If you need to reach something above you, use a strong step stool that has a grab bar. ?Keep electrical cords out of the way. ?Do not use floor polish or wax that makes floors slippery. If you must use wax, use non-skid floor wax. ?Do not have throw rugs and other things on the floor that can make you trip. ?What  can I do with my stairs? ?Do not leave any items on the stairs. ?Make sure that there are handrails on both sides of the stairs and use them. Fix handrails that are broken or loose. Make sure that handrails are as long as the stairways. ?Check any carpeting to make sure that it is firmly attached to the stairs. Fix any carpet that is loose or worn. ?Avoid having throw rugs at the top or bottom of the stairs. If you do have throw rugs, attach them to the floor with carpet tape. ?Make sure that you have a light switch at the top of the stairs and the bottom of the stairs. If you do not have them, ask someone to add them for you. ?What else can I do to help prevent falls? ?Wear shoes that: ?Do not  have high heels. ?Have rubber bottoms. ?Are comfortable and fit you well. ?Are closed at the toe. Do not wear sandals. ?If you use a stepladder: ?Make sure that it is fully opened. Do not climb a close

## 2022-05-27 ENCOUNTER — Ambulatory Visit (INDEPENDENT_AMBULATORY_CARE_PROVIDER_SITE_OTHER): Payer: 59 | Admitting: Family Medicine

## 2022-05-27 ENCOUNTER — Encounter: Payer: Self-pay | Admitting: Family Medicine

## 2022-05-27 VITALS — BP 110/80 | HR 94 | Temp 98.2°F | Ht 59.0 in | Wt 137.0 lb

## 2022-05-27 DIAGNOSIS — F17200 Nicotine dependence, unspecified, uncomplicated: Secondary | ICD-10-CM | POA: Diagnosis not present

## 2022-05-27 DIAGNOSIS — F319 Bipolar disorder, unspecified: Secondary | ICD-10-CM

## 2022-05-27 DIAGNOSIS — E785 Hyperlipidemia, unspecified: Secondary | ICD-10-CM

## 2022-05-27 DIAGNOSIS — K136 Irritative hyperplasia of oral mucosa: Secondary | ICD-10-CM

## 2022-05-27 DIAGNOSIS — K3184 Gastroparesis: Secondary | ICD-10-CM

## 2022-05-27 DIAGNOSIS — E039 Hypothyroidism, unspecified: Secondary | ICD-10-CM

## 2022-05-27 DIAGNOSIS — G8929 Other chronic pain: Secondary | ICD-10-CM

## 2022-05-27 LAB — CBC WITH DIFFERENTIAL/PLATELET
Basophils Absolute: 0.1 10*3/uL (ref 0.0–0.1)
Basophils Relative: 0.5 % (ref 0.0–3.0)
Eosinophils Absolute: 0.2 10*3/uL (ref 0.0–0.7)
Eosinophils Relative: 1.5 % (ref 0.0–5.0)
HCT: 47.5 % — ABNORMAL HIGH (ref 36.0–46.0)
Hemoglobin: 15.9 g/dL — ABNORMAL HIGH (ref 12.0–15.0)
Lymphocytes Relative: 26.9 % (ref 12.0–46.0)
Lymphs Abs: 3.7 10*3/uL (ref 0.7–4.0)
MCHC: 33.4 g/dL (ref 30.0–36.0)
MCV: 91.4 fl (ref 78.0–100.0)
Monocytes Absolute: 1 10*3/uL (ref 0.1–1.0)
Monocytes Relative: 7.2 % (ref 3.0–12.0)
Neutro Abs: 8.8 10*3/uL — ABNORMAL HIGH (ref 1.4–7.7)
Neutrophils Relative %: 63.9 % (ref 43.0–77.0)
Platelets: 280 10*3/uL (ref 150.0–400.0)
RBC: 5.2 Mil/uL — ABNORMAL HIGH (ref 3.87–5.11)
RDW: 13.8 % (ref 11.5–15.5)
WBC: 13.7 10*3/uL — ABNORMAL HIGH (ref 4.0–10.5)

## 2022-05-27 LAB — COMPREHENSIVE METABOLIC PANEL
ALT: 24 U/L (ref 0–35)
AST: 20 U/L (ref 0–37)
Albumin: 4.4 g/dL (ref 3.5–5.2)
Alkaline Phosphatase: 105 U/L (ref 39–117)
BUN: 14 mg/dL (ref 6–23)
CO2: 30 mEq/L (ref 19–32)
Calcium: 11.1 mg/dL — ABNORMAL HIGH (ref 8.4–10.5)
Chloride: 103 mEq/L (ref 96–112)
Creatinine, Ser: 1.04 mg/dL (ref 0.40–1.20)
GFR: 57.21 mL/min — ABNORMAL LOW (ref >60.00)
Glucose, Bld: 126 mg/dL — ABNORMAL HIGH (ref 70–99)
Potassium: 4.5 mEq/L (ref 3.5–5.1)
Sodium: 142 mEq/L (ref 135–145)
Total Bilirubin: 0.5 mg/dL (ref 0.2–1.2)
Total Protein: 7.2 g/dL (ref 6.0–8.3)

## 2022-05-27 LAB — TSH: TSH: 0.73 u[IU]/mL (ref 0.35–5.50)

## 2022-05-27 LAB — VITAMIN D 25 HYDROXY (VIT D DEFICIENCY, FRACTURES): VITD: 22.06 ng/mL — ABNORMAL LOW (ref 30.00–100.00)

## 2022-05-27 LAB — LDL CHOLESTEROL, DIRECT: Direct LDL: 119 mg/dL

## 2022-05-27 LAB — LIPID PANEL
Cholesterol: 256 mg/dL — ABNORMAL HIGH (ref 0–200)
HDL: 32.9 mg/dL — ABNORMAL LOW (ref >39.00)
Total CHOL/HDL Ratio: 8
Triglycerides: 568 mg/dL — ABNORMAL HIGH (ref 0.0–149.0)

## 2022-05-27 MED ORDER — MAGIC MOUTHWASH
5.0000 mL | Freq: Three times a day (TID) | ORAL | 0 refills | Status: DC | PRN
Start: 2022-05-27 — End: 2022-10-14

## 2022-05-27 MED ORDER — OMEPRAZOLE 40 MG PO CPDR: 40.0000 mg | DELAYED_RELEASE_CAPSULE | Freq: Every day | ORAL | 0 refills | Status: DC

## 2022-05-27 NOTE — Patient Instructions (Addendum)
Go to the lab on the way out.   If you have mychart we'll likely use that to update you.    Take care.  Glad to see you. We'll check on GI appointment.

## 2022-05-27 NOTE — Progress Notes (Signed)
She has been for lung CT screening program.  D/w pt.  Per outside clinic.  Smoking less than prev.  Smoking about 10 cigs per day.  I thanked her for her effort, encouraged full cessation.  D/w pt about GI eval.  Still vomiting multiple times per week.  Needs GI eval, with appointment pending.  No clear trigger for sx.  Zofran didn't help.  H/o gastroparesis noted, d/w pt.  Not vomiting blood.  No blood in stools.  We will ask if patient can see [facility] sooner.    She had cataract surgery.  Vision improved.    Has seen psych and pain clinic.  She reportedly stable on current meds and "back to her old self for the last year" w/o psychotic episodes per daughter's report.    D/w pt about oral sx.  Recently had implants placed on lower gum line.  She had slow healing.  [facility] mouthwash helped.  Refill printed, to have on hand.  Hypothyroidism.  Due for f/u labs.  She has B hand tremor with hand extension but not at rest.    H/o hypercalcemia, f/u pending.  See notes on labs.  There is longstanding issue after previous lithium treatment.  Meds, vitals, and allergies reviewed.   ROS: Per HPI unless specifically indicated in ROS section   GEN: nad, alert and oriented, pleasant in conversation HEENT: ncat, MMM NECK: supple w/o LA CV: rrr PULM: ctab, no inc wob ABD: soft, +bs EXT: no edema SKIN: no acute rash

## 2022-05-29 ENCOUNTER — Other Ambulatory Visit: Payer: Self-pay | Admitting: Family Medicine

## 2022-05-29 ENCOUNTER — Telehealth: Payer: Self-pay | Admitting: Family Medicine

## 2022-05-29 DIAGNOSIS — K136 Irritative hyperplasia of oral mucosa: Secondary | ICD-10-CM | POA: Insufficient documentation

## 2022-05-29 DIAGNOSIS — R739 Hyperglycemia, unspecified: Secondary | ICD-10-CM

## 2022-05-29 NOTE — Assessment & Plan Note (Signed)
Per outside clinic. 

## 2022-05-29 NOTE — Assessment & Plan Note (Signed)
Continue levothyroxine.  See notes on labs. 

## 2022-05-29 NOTE — Assessment & Plan Note (Signed)
Okay to use Magic mouthwash if needed.  Prescription printed.

## 2022-05-29 NOTE — Assessment & Plan Note (Signed)
Discussed with patient about cessation.

## 2022-05-29 NOTE — Assessment & Plan Note (Signed)
See notes on follow-up labs. 

## 2022-05-29 NOTE — Telephone Encounter (Signed)
The patient is already on the cancellation clinic in the [facility].  Is it possible for her to get seen sooner at [facility]?

## 2022-05-29 NOTE — Assessment & Plan Note (Signed)
Needs GI evaluation. We will ask if patient can see [facility] sooner.

## 2022-06-01 ENCOUNTER — Encounter: Payer: Self-pay | Admitting: Family Medicine

## 2022-06-08 NOTE — Telephone Encounter (Signed)
Do you know if patient will be able to be seen sooner at [facility]?

## 2022-06-15 NOTE — Telephone Encounter (Signed)
Please let me know about options for this patient.  Thanks.

## 2022-06-17 ENCOUNTER — Other Ambulatory Visit: Payer: Self-pay | Admitting: Family Medicine

## 2022-06-18 LAB — CALCIUM: Calcium: 10.5 mg/dL — ABNORMAL HIGH (ref 8.7–10.3)

## 2022-06-18 LAB — PARATHYROID HORMONE, INTACT (NO CA): PTH: 42 pg/mL (ref 15–65)

## 2022-06-18 LAB — HGB A1C W/O EAG: Hgb A1c MFr Bld: 6.3 % — ABNORMAL HIGH (ref 4.8–5.6)

## 2022-07-11 ENCOUNTER — Encounter: Payer: Self-pay | Admitting: Family

## 2022-07-11 ENCOUNTER — Ambulatory Visit (INDEPENDENT_AMBULATORY_CARE_PROVIDER_SITE_OTHER): Payer: 59 | Admitting: Family

## 2022-07-11 VITALS — BP 106/76 | HR 103 | Temp 97.9°F | Resp 16 | Ht 59.0 in | Wt 139.4 lb

## 2022-07-11 DIAGNOSIS — J302 Other seasonal allergic rhinitis: Secondary | ICD-10-CM | POA: Diagnosis not present

## 2022-07-11 DIAGNOSIS — J0110 Acute frontal sinusitis, unspecified: Secondary | ICD-10-CM | POA: Diagnosis not present

## 2022-07-11 DIAGNOSIS — R053 Chronic cough: Secondary | ICD-10-CM | POA: Diagnosis not present

## 2022-07-11 MED ORDER — FLUTICASONE PROPIONATE 50 MCG/ACT NA SUSP: 2.0000 | Freq: Every day | NASAL | 6 refills | Status: DC

## 2022-07-11 MED ORDER — AMOXICILLIN-POT CLAVULANATE 875-125 MG PO TABS: 1.0000 | ORAL_TABLET | Freq: Two times a day (BID) | ORAL | 0 refills | Status: DC

## 2022-07-11 NOTE — Assessment & Plan Note (Signed)
Start daily flonase and Human resources officer

## 2022-07-11 NOTE — Assessment & Plan Note (Signed)
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  flonase and allegra daily

## 2022-07-11 NOTE — Patient Instructions (Addendum)
Suspected code violations can be reported with the Code Violation Form or by contacting the city's code enforcement officer by phone at [phone].  Start flonase daily to your nose as well as nightly allegra.   Due to recent changes in healthcare laws, you may see results of your imaging and/or laboratory studies on MyChart before I have had a chance to review them.  I understand that in some cases there may be results that are confusing or concerning to you. Please understand that not all results are received at the same time and often I may need to interpret multiple results in order to provide you with the best plan of care or course of treatment. Therefore, I ask that you please give me 2 business days to thoroughly review all your results before contacting my office for clarification. Should we see a critical lab result, you will be contacted sooner.   It was a pleasure seeing you today! Please do not hesitate to reach out with any questions and or concerns.  Regards,   Eugenia Pancoast FNP-C

## 2022-07-11 NOTE — Progress Notes (Signed)
Established Patient Office Visit  Subjective:  Patient ID: Leslie Wong, female    DOB: Aug 27, 1956  Age: 66 y.o. MRN: [id number]  CC:  Chief Complaint  Patient presents with   Otitis Externa    X 3 days both ears    HPI Leslie Wong is here today with concerns.   Bil ear pain, right greater than left. Feels like bubbling in her ears.  No sore throat. No nasal congestion. No sinus pressure.  No drainage from the ears.   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 She is having to stay with her daughter and use a storage unit while cleaned out she has had repeat recurrent URI and they think this might be the cause. She does not see a pulmonologist at current but does have COPD.   Past Medical History:  Diagnosis Date   Back pain    Cancer ([place])    skin   COPD (chronic obstructive pulmonary disease) (HCC)    Depression    BAD   Gastroparesis    GERD (gastroesophageal reflux disease)    HOH (hard of hearing)    Hypothyroidism    Insomnia    Migraines    Motion sickness    Parkinson disease ([place])    per [facility], dx'd 2019   Recurrent cold sores    Shoulder bursitis    Urge incontinence    Wears dentures    full upper and lower    Past Surgical History:  Procedure Laterality Date   ABDOMINAL HYSTERECTOMY  12/26/1997   total   APPENDECTOMY     CARPAL TUNNEL RELEASE Right    and tendon repair   CATARACT EXTRACTION W/PHACO Left 10/04/2021   Procedure: CATARACT EXTRACTION PHACO AND INTRAOCULAR LENS PLACEMENT ([place]) LEFT 9.46 00:48.0;  Surgeon: Eulogio Bear, MD;  Location: [facility];  Service: Ophthalmology;  Laterality: Left;  requests early   CATARACT EXTRACTION W/PHACO Right 11/08/2021   Procedure: CATARACT EXTRACTION PHACO AND INTRAOCULAR LENS PLACEMENT ([facility]) RIGHT;  Surgeon: Eulogio Bear, MD;  Location: [facility];  Service: Ophthalmology;   Laterality: Right;  2.37 00:26.0   ESOPHAGOGASTRODUODENOSCOPY  01/26/2006   negative except small hiatal hernia   ESOPHAGOGASTRODUODENOSCOPY  02/24/2015   See report   LAPAROSCOPY     for endometriosis   OVARIAN CYST REMOVAL  12/26/1988   unilateral   TONSILLECTOMY AND ADENOIDECTOMY      Family History  Problem Relation Age of Onset   Hypertension Mother    Arthritis Mother    Diabetes Mother    Stroke Mother    Cancer Father        lung   Colon cancer Neg Hx    Breast cancer Neg Hx     Social History   Socioeconomic History   Marital status: Single    Spouse name: Not on file   Number of children: Not on file   Years of education: Not on file   Highest education level: Not on file  Occupational History   Not on file  Tobacco Use   Smoking status: Some Days    Packs/day: 0.25    Years: 42.50    Total pack years: 10.63    Types: Cigarettes   Smokeless tobacco: Former   Tobacco comments:    declined  Scientific laboratory technician Use: Some days  Substance and Sexual Activity   Alcohol  use: No    Alcohol/week: 0.0 standard drinks of alcohol   Drug use: No   Sexual activity: Never  Other Topics Concern   Not on file  Social History Narrative   Lives alone.     Has a dog names Dixie   Social Determinants of Health   Financial Resource Strain: Low Risk  (05/11/2022)   Overall Financial Resource Strain (CARDIA)    Difficulty of Paying Living Expenses: Not hard at all  Food Insecurity: No Food Insecurity (05/11/2022)   Hunger Vital Sign    Worried About Running Out of Food in the Last Year: Never true    Ran Out of Food in the Last Year: Never true  Transportation Needs: No Transportation Needs (05/11/2022)   PRAPARE - Hydrologist (Medical): No    Lack of Transportation (Non-Medical): No  Physical Activity: Insufficiently Active (05/11/2022)   Exercise Vital Sign    Days of Exercise per Week: 2 days    Minutes of Exercise per Session: 20  min  Stress: No Stress Concern Present (05/11/2022)   [place]    Feeling of Stress : Not at all  Social Connections: Socially Isolated (05/11/2022)   Social Connection and Isolation Panel [NHANES]    Frequency of Communication with Friends and Family: More than three times a week    Frequency of Social Gatherings with Friends and Family: More than three times a week    Attends Religious Services: Never    Marine scientist or Organizations: No    Attends Archivist Meetings: Never    Marital Status: Divorced  Human resources officer Violence: Not At Risk (05/11/2022)   Humiliation, Afraid, Rape, and Kick questionnaire    Fear of Current or Ex-Partner: No    Emotionally Abused: No    Physically Abused: No    Sexually Abused: No    Outpatient Medications Prior to Visit  Medication Sig Dispense Refill   albuterol (VENTOLIN HFA) 108 (90 Base) MCG/ACT inhaler Inhale 2 puffs into the lungs every 6 (six) hours as needed for wheezing or shortness of breath. 18 g 0   amitriptyline (ELAVIL) 25 MG tablet Take 25 mg by mouth at bedtime as needed for sleep (ORIGINAL ZJQ:BHAL 1 TO 2 TABLETS BY MOUTH AT BEDTIME FOR SLEEP OR PAIN).     budesonide-formoterol (SYMBICORT) 80-4.5 MCG/ACT inhaler Bid 1 each 0   buprenorphine (BUTRANS) 10 MCG/HR PTWK Place 1 patch onto the skin once a week.     buPROPion (WELLBUTRIN XL) 150 MG 24 hr tablet Take 150 mg by mouth every morning.     Cholecalciferol 25 MCG (1000 UT) capsule Take 1,000 Units by mouth daily.     hydrOXYzine (ATARAX/VISTARIL) 50 MG tablet Take 1 tablet (50 mg total) by mouth 3 (three) times daily as needed for anxiety. 60 tablet 0   ipratropium-albuterol (DUONEB) 0.5-2.5 (3) MG/3ML SOLN Take 3 mLs by nebulization every 4 (four) hours as needed. For wheezing and cough 360 mL 0   levothyroxine (SYNTHROID) 88 MCG tablet TAKE 1 TABLET(88 MCG) BY MOUTH DAILY AT 6 AM 90 tablet 3    magic mouthwash SOLN Take 5 mLs by mouth 3 (three) times daily as needed for mouth pain. 200 mL 0   mirtazapine (REMERON) 30 MG tablet Take 30 mg by mouth at bedtime.     Multiple Vitamins-Minerals (MULTIVITAMIN ADULTS PO) Take by mouth.     OLANZapine (ZYPREXA)  15 MG tablet Take by mouth.     Omega-3 Fatty Acids (FISH OIL) 1000 MG CAPS Take by mouth daily.     omeprazole (PRILOSEC) 40 MG capsule Take 1 capsule (40 mg total) by mouth daily. 90 capsule 0   ondansetron (ZOFRAN-ODT) 8 MG disintegrating tablet Take 8 mg by mouth 2 (two) times daily.     Oxybutynin Chloride (GELNIQUE) 10 % GEL Place onto the skin.     Oxycodone HCl 10 MG TABS Take 10 mg by mouth every 6 (six) hours as needed for pain.     tiZANidine (ZANAFLEX) 4 MG tablet Take by mouth.     valACYclovir (VALTREX) 500 MG tablet Take 1 tablet (500 mg total) by mouth daily. 90 tablet 1   No facility-administered medications prior to visit.    Allergies  Allergen Reactions   Doxepin Other (See Comments)    Pt reports "that made me have seizures"   Valproic Acid Shortness Of Breath   Aspirin     REACTION: UNSPECIFIED   Atorvastatin Other (See Comments)    Tongue swelling, legs cramping   Baclofen     REACTION: Throat swells up   Buprenorphine     Vomiting, intolerant   Duloxetine Other (See Comments)    Vision loss, weight loss   Erythromycin     REACTION: UNSPECIFIED   Gabapentin     REACTION: throat swells up   Metoclopramide Hcl     REACTION: states "messed me up"   Nortriptyline     Other reaction(s): Other (See Comments) Vision issues   Nsaids Other (See Comments)    Stomach problems   Nucynta Er [Tapentadol Hcl Er]     Intolerant, see note from 07/24/12.     Nucynta [Tapentadol]     AMS   Prednisone     GI intolance   Pregabalin    Seroquel [Quetiapine Fumerate] Other (See Comments)    Loss control, felt like on another planet   Sulfa Antibiotics Nausea And Vomiting   Topamax Other (See Comments)     Blisters in mouth and tongue   Vicodin [Hydrocodone-Acetaminophen]     Nausea, thrush and constipation        Objective:    Physical Exam Vitals reviewed.  Constitutional:      General: She is not in acute distress.    Appearance: Normal appearance. She is not ill-appearing.  HENT:     Right Ear: Tympanic membrane normal.     Left Ear: Tympanic membrane normal.     Nose: Mucosal edema and congestion present. No rhinorrhea.     Right Turbinates: Swollen. Not enlarged.     Left Turbinates: Not enlarged or swollen.     Right Sinus: Frontal sinus tenderness present. No maxillary sinus tenderness.     Left Sinus: Frontal sinus tenderness present. No maxillary sinus tenderness.     Mouth/Throat:     Mouth: Mucous membranes are moist.     Pharynx: No pharyngeal swelling, oropharyngeal exudate or posterior oropharyngeal erythema.     Tonsils: No tonsillar exudate.  Eyes:     Extraocular Movements: Extraocular movements intact.     Conjunctiva/sclera: Conjunctivae normal.     Pupils: Pupils are equal, round, and reactive to light.  Neck:     Thyroid: No thyroid mass.  Cardiovascular:     Rate and Rhythm: Normal rate and regular rhythm.  Pulmonary:     Effort: Pulmonary effort is normal.     Breath sounds: Normal breath  sounds.  Lymphadenopathy:     Cervical:     Right cervical: No superficial cervical adenopathy.    Left cervical: No superficial cervical adenopathy.  Neurological:     Mental Status: She is alert.     BP 106/76   Pulse (!) 103   Temp 97.9 F (36.6 C)   Resp 16   Ht '4\' 11"'$  (1.499 m)   Wt 139 lb 6 oz (63.2 kg)   SpO2 97%   BMI 28.15 kg/m  Wt Readings from Last 3 Encounters:  07/11/22 139 lb 6 oz (63.2 kg)  05/27/22 137 lb (62.1 kg)  05/11/22 145 lb (65.8 kg)     Health Maintenance Due  Topic Date Due   Zoster Vaccines- Shingrix (1 of 2) Never done   COLONOSCOPY (Pts 45-28yr Insurance coverage will need to be confirmed)  02/27/2020   MAMMOGRAM   02/24/2021    There are no preventive care reminders to display for this patient.  Lab Results  Component Value Date   TSH 0.73 05/27/2022   Lab Results  Component Value Date   WBC 13.7 (H) 05/27/2022   HGB 15.9 (H) 05/27/2022   HCT 47.5 (H) 05/27/2022   MCV 91.4 05/27/2022   PLT 280.0 05/27/2022   Lab Results  Component Value Date   NA 142 05/27/2022   K 4.5 05/27/2022   CO2 30 05/27/2022   GLUCOSE 126 (H) 05/27/2022   BUN 14 05/27/2022   CREATININE 1.04 05/27/2022   BILITOT 0.5 05/27/2022   ALKPHOS 105 05/27/2022   AST 20 05/27/2022   ALT 24 05/27/2022   PROT 7.2 05/27/2022   ALBUMIN 4.4 05/27/2022   CALCIUM 10.5 (H) 06/17/2022   ANIONGAP 7 05/29/2021   GFR 57.21 (L) 05/27/2022   Lab Results  Component Value Date   HGBA1C 6.3 (H) 06/17/2022      Assessment & Plan:   Problem List Items Addressed This Visit       Respiratory   Seasonal allergic rhinitis    Start daily flonase and allegra      Relevant Medications   amoxicillin-clavulanate (AUGMENTIN) 875-125 MG tablet   fluticasone (FLONASE) 50 MCG/ACT nasal spray   Other Relevant Orders   Respiratory or Resp and Sputum Culture   Acute non-recurrent frontal sinusitis    Prescription given for augmentin 875/125 mg po bid for ten days. Pt to continue tylenol/ibuprofen prn sinus pain. Continue with humidifier prn and steam showers recommended as well. instructed If no symptom improvement in 48 hours please f/u       Relevant Medications   amoxicillin-clavulanate (AUGMENTIN) 875-125 MG tablet   fluticasone (FLONASE) 50 MCG/ACT nasal spray   Other Relevant Orders   Respiratory or Resp and Sputum Culture     Other   Cough - Primary    Testing for sputum culture pending results  Exposed to aspergillus in her home might be contributing.  flonase and allegra daily       Relevant Orders   Respiratory or Resp and Sputum Culture    Meds ordered this encounter  Medications   DISCONTD: fluticasone  (FLONASE) 50 MCG/ACT nasal spray    Sig: Place 2 sprays into both nostrils daily.    Dispense:  16 g    Refill:  6    Order Specific Question:   Supervising Provider    Answer:   BEDSOLE, AMY E [[id number]]   DISCONTD: amoxicillin-clavulanate (AUGMENTIN) 875-125 MG tablet    Sig: Take 1 tablet by mouth 2 (  two) times daily.    Dispense:  20 tablet    Refill:  0    Order Specific Question:   Supervising Provider    Answer:   BEDSOLE, AMY E [[id number]]   amoxicillin-clavulanate (AUGMENTIN) 875-125 MG tablet    Sig: Take 1 tablet by mouth 2 (two) times daily.    Dispense:  20 tablet    Refill:  0    Order Specific Question:   Supervising Provider    Answer:   BEDSOLE, AMY E [[id number]]   fluticasone (FLONASE) 50 MCG/ACT nasal spray    Sig: Place 2 sprays into both nostrils daily.    Dispense:  16 g    Refill:  6    Order Specific Question:   Supervising Provider    Answer:   BEDSOLE, AMY E [[id number]]    Follow-up: No follow-ups on file.    Eugenia Pancoast, FNP

## 2022-07-11 NOTE — Assessment & Plan Note (Signed)
Prescription given for augmentin 875/125 mg po bid for ten days. Pt to continue tylenol/ibuprofen prn sinus pain. Continue with humidifier prn and steam showers recommended as well. instructed If no symptom improvement in 48 hours please f/u ? ?

## 2022-07-12 LAB — RESPIRATORY CULTURE OR RESPIRATORY AND SPUTUM CULTURE: MICRO NUMBER:: 13655671

## 2022-07-13 ENCOUNTER — Other Ambulatory Visit: Payer: Self-pay | Admitting: Family

## 2022-07-13 DIAGNOSIS — R918 Other nonspecific abnormal finding of lung field: Secondary | ICD-10-CM | POA: Insufficient documentation

## 2022-07-13 DIAGNOSIS — J219 Acute bronchiolitis, unspecified: Secondary | ICD-10-CM | POA: Insufficient documentation

## 2022-07-13 DIAGNOSIS — J439 Emphysema, unspecified: Secondary | ICD-10-CM

## 2022-07-13 NOTE — Progress Notes (Signed)
Sputum collected but unable to be processed. I will refer pt to pulmonary as she also has chronic emphysematous changes in the lungs (emphysema)

## 2022-08-03 ENCOUNTER — Encounter: Payer: Self-pay | Admitting: Pulmonary Disease

## 2022-08-03 ENCOUNTER — Ambulatory Visit (INDEPENDENT_AMBULATORY_CARE_PROVIDER_SITE_OTHER): Payer: 59 | Admitting: Pulmonary Disease

## 2022-08-03 VITALS — BP 120/64 | HR 91 | Temp 98.4°F | Ht 59.0 in | Wt 140.6 lb

## 2022-08-03 DIAGNOSIS — F17210 Nicotine dependence, cigarettes, uncomplicated: Secondary | ICD-10-CM

## 2022-08-03 DIAGNOSIS — J439 Emphysema, unspecified: Secondary | ICD-10-CM | POA: Diagnosis not present

## 2022-08-03 MED ORDER — STIOLTO RESPIMAT 2.5-2.5 MCG/ACT IN AERS: 2.0000 | INHALATION_SPRAY | Freq: Every day | RESPIRATORY_TRACT | 11 refills | Status: DC

## 2022-08-03 NOTE — Patient Instructions (Signed)
It is nice to meet you  I am glad you have moved out of the apartment I hope they fix the issue soon  I think it is quite possible Aspergillus could be causing the sensitivity or immune reaction like asthma.  It seems to Symbicort does not help much.  Stop the Symbicort for now.  Start Stiolto 2 puffs once a day.  I sent a new prescription.  Please let me know if is too expensive.  If there is no improvement with this it would be okay to stop this in the future.  I will arrange follow-up with our pulmonary doctors in [place].  I think would be good in the future to get pulmonary function test to further define how well the lungs are working and if we can identify other reasons you are short of breath.  I will arrange this after the CT scan in September for lung cancer screening.  This will give [name] further information about effects of Aspergillus or other issues inside the lung.  Return to clinic in 6 weeks at [facility] office

## 2022-08-03 NOTE — Progress Notes (Signed)
$'@Patient'w$  ID: Leslie Wong, female    DOB: 1956/06/13, 66 y.o.   MRN: [id number]  Chief Complaint  Patient presents with   Consult    Pt is here for consult for pulm emphysema and nodules. 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 Was living there since 2007. And got worse over time. Symptoms include throwing up, losing weight, Sob, and wheezing. Pt states she has had PNA several times. Pt is on Albuterol as needed and Symbicort. Pt states that the medication is not helping much. Neb medications help some per patient.     Referring provider: Eugenia Pancoast, FNP  HPI:   66 y.o. woman whom we seen in consultation for evaluation of dyspnea on exertion.  Note from referring provider reviewed.  Patient chief complaint is exposure to Aspergillus.  Longstanding history of GI issues, nausea and weight loss.  At 1 point was half the weight she is now.  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  This was supposed to look into but was not remedied per report.  Independent testing group was sent out and there is Aspergillus there.  She has moved in with her daughter since, over the last month.  Over that time, her breathing feels a bit better.  I will be less short of breath.  Cough is essentially resolved.  She has ongoing body aches throughout.  Notably her weight is 140 pounds.  At 1 point time she was in the 70 pound range per daughter.  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    She has shortness of breath with exertion throughout the day.  No time of day when things are better or worse.  No position make things better or worse.  No seasonal environmental factors she had did not make it better or worse the exception of  my improvement since moving out of the apartment.  She uses Symbicort and albuterol.  Symbicort does not to be very beneficial.  Albuterol helps some.  No other alleviating or exacerbating factors.  PMH: Asthma, emphysema, chronic headaches Surgical history: Reviewed, none Family history: Lung cancer in parent, otherwise no respiratory illnesses in first-degree relatives Social history: Current smoker, half pack a day, down to a pack a day with assistance of vape.  Lives with daughter in [place] / Pulmonary Flowsheets:   ACT:      No data to display          MMRC:     No data to display          Epworth:      No data to display          Tests:   FENO:  No results found for: "NITRICOXIDE"  PFT:     No data to display          WALK:      No data to display          Imaging: Personally reviewed and as per EMR discussion this note No results found.  Lab Results: Personally reviewed CBC    Component Value Date/Time   WBC 13.7 (H) 05/27/2022 0903   RBC 5.20 (H) 05/27/2022 0903   HGB 15.9 (H) 05/27/2022 0903   HGB 14.1 01/27/2013 7342  HCT 47.5 (H) 05/27/2022 0903   HCT 42.5 01/27/2013 0213   PLT 280.0 05/27/2022 0903   PLT 347 01/27/2013 0213   MCV 91.4 05/27/2022 0903   MCV 98 01/27/2013 0213   MCH 31.5 05/29/2021 0437   MCHC 33.4 05/27/2022 0903   RDW 13.8 05/27/2022 0903   RDW 13.8 01/27/2013 0213   LYMPHSABS 3.7 05/27/2022 0903   LYMPHSABS 3.8 (H) 01/27/2013 0213   MONOABS 1.0 05/27/2022 0903   MONOABS 0.9 01/27/2013 0213   EOSABS 0.2 05/27/2022 0903   EOSABS 0.4 01/27/2013 0213   BASOSABS 0.1 05/27/2022 0903   BASOSABS 0.1 01/27/2013 0213    BMET    Component Value Date/Time   NA 142 05/27/2022 0903   NA 140 06/25/2014 1234   K 4.5 05/27/2022 0903   K 4.1 06/25/2014 1234   CL 103 05/27/2022 0903   CL 106 06/25/2014 1234   CO2 30 05/27/2022 0903   CO2 28 06/25/2014 1234   GLUCOSE 126 (H) 05/27/2022 0903    GLUCOSE 91 06/25/2014 1234   BUN 14 05/27/2022 0903   BUN 10 06/25/2014 1234   CREATININE 1.04 05/27/2022 0903   CREATININE 1.01 06/25/2014 1234   CALCIUM 10.5 (H) 06/17/2022 0936   CALCIUM 10.1 06/25/2014 1234   CALCIUM 10.8 (H) 06/05/2012 1256   GFRNONAA >60 05/29/2021 0437   GFRNONAA >60 06/25/2014 1234   GFRAA >60 03/24/2020 1241   GFRAA >60 06/25/2014 1234    BNP    Component Value Date/Time   BNP 75.3 05/25/2021 1545    ProBNP No results found for: "PROBNP"  Specialty Problems       Pulmonary Problems   Gastroesophageal reflux disease with hiatal hernia    Qualifier: Diagnosis of  By: Council Mechanic MD, Hilaria Ota       Cough   Pulmonary nodule   COPD (chronic obstructive pulmonary disease) (HCC)   Acute non-recurrent frontal sinusitis   Seasonal allergic rhinitis   Bronchiolitis   Pulmonary emphysema (HCC)   Pulmonary nodules    Allergies  Allergen Reactions   Doxepin Other (See Comments)    Pt reports "that made me have seizures"   Valproic Acid Shortness Of Breath   Aspirin     REACTION: UNSPECIFIED   Atorvastatin Other (See Comments)    Tongue swelling, legs cramping   Baclofen     REACTION: Throat swells up   Buprenorphine     Vomiting, intolerant   Duloxetine Other (See Comments)    Vision loss, weight loss   Erythromycin     REACTION: UNSPECIFIED   Gabapentin     REACTION: throat swells up   Metoclopramide Hcl     REACTION: states "messed me up"   Nortriptyline     Other reaction(s): Other (See Comments) Vision issues   Nsaids Other (See Comments)    Stomach problems   Nucynta Er [Tapentadol Hcl Er]     Intolerant, see note from 07/24/12.     Nucynta [Tapentadol]     AMS   Prednisone     GI intolance   Pregabalin    Seroquel [Quetiapine Fumerate] Other (See Comments)    Loss control, felt like on another planet   Sulfa Antibiotics Nausea And Vomiting   Topamax Other (See Comments)    Blisters in mouth and tongue   Vicodin  [Hydrocodone-Acetaminophen]     Nausea, thrush and constipation    Immunization History  Administered Date(s) Administered   Influenza Whole 09/17/2010   Influenza,inj,Quad PF,6+ Mos  09/13/2018   Influenza-Unspecified 09/25/2016, 09/13/2018   PNEUMOCOCCAL CONJUGATE-20 06/17/2021   PPD Test 05/29/2019   Td 12/26/1994, 10/31/2007   Tdap 06/22/2017    Past Medical History:  Diagnosis Date   Back pain    Cancer ([place])    skin   COPD (chronic obstructive pulmonary disease) (HCC)    Depression    BAD   Gastroparesis    GERD (gastroesophageal reflux disease)    HOH (hard of hearing)    Hypothyroidism    Insomnia    Migraines    Motion sickness    Parkinson disease ([place])    per [facility], dx'd 2019   Recurrent cold sores    Shoulder bursitis    Urge incontinence    Wears dentures    full upper and lower    Tobacco History: Social History   Tobacco Use  Smoking Status Some Days   Packs/day: 0.25   Years: 42.50   Total pack years: 10.63   Types: Cigarettes, E-cigarettes  Smokeless Tobacco Former  Tobacco Comments   Pt states she smokes 1/2 ppd and she does vape. April Smith RN BSN 08/03/22   Ready to quit: Not Answered Counseling given: Not Answered Tobacco comments: Pt states she smokes 1/2 ppd and she does vape. April Smith RN BSN 08/03/22   Continue to not smoke  Outpatient Encounter Medications as of 08/03/2022  Medication Sig   albuterol (VENTOLIN HFA) 108 (90 Base) MCG/ACT inhaler Inhale 2 puffs into the lungs every 6 (six) hours as needed for wheezing or shortness of breath.   amitriptyline (ELAVIL) 25 MG tablet Take 25 mg by mouth at bedtime as needed for sleep (ORIGINAL HAL:PFXT 1 TO 2 TABLETS BY MOUTH AT BEDTIME FOR SLEEP OR PAIN).   amoxicillin-clavulanate (AUGMENTIN) 875-125 MG tablet Take 1 tablet by mouth 2 (two) times daily.   budesonide-formoterol (SYMBICORT) 80-4.5 MCG/ACT inhaler Bid   buprenorphine (BUTRANS) 10 MCG/HR PTWK Place 1 patch onto the  skin once a week.   buPROPion (WELLBUTRIN XL) 150 MG 24 hr tablet Take 150 mg by mouth every morning.   Cholecalciferol 25 MCG (1000 UT) capsule Take 1,000 Units by mouth daily.   fluticasone (FLONASE) 50 MCG/ACT nasal spray Place 2 sprays into both nostrils daily.   hydrOXYzine (ATARAX/VISTARIL) 50 MG tablet Take 1 tablet (50 mg total) by mouth 3 (three) times daily as needed for anxiety.   ipratropium-albuterol (DUONEB) 0.5-2.5 (3) MG/3ML SOLN Take 3 mLs by nebulization every 4 (four) hours as needed. For wheezing and cough   levothyroxine (SYNTHROID) 88 MCG tablet TAKE 1 TABLET(88 MCG) BY MOUTH DAILY AT 6 AM   magic mouthwash SOLN Take 5 mLs by mouth 3 (three) times daily as needed for mouth pain.   mirtazapine (REMERON) 30 MG tablet Take 30 mg by mouth at bedtime.   Multiple Vitamins-Minerals (MULTIVITAMIN ADULTS PO) Take by mouth.   OLANZapine (ZYPREXA) 15 MG tablet Take by mouth.   Omega-3 Fatty Acids (FISH OIL) 1000 MG CAPS Take by mouth daily.   omeprazole (PRILOSEC) 40 MG capsule Take 1 capsule (40 mg total) by mouth daily.   ondansetron (ZOFRAN-ODT) 8 MG disintegrating tablet Take 8 mg by mouth 2 (two) times daily.   Oxybutynin Chloride (GELNIQUE) 10 % GEL Place onto the skin.   Oxycodone HCl 10 MG TABS Take 10 mg by mouth every 6 (six) hours as needed for pain.   Tiotropium Bromide-Olodaterol (STIOLTO RESPIMAT) 2.5-2.5 MCG/ACT AERS Inhale 2 puffs into the lungs daily.  tiZANidine (ZANAFLEX) 4 MG tablet Take by mouth.   valACYclovir (VALTREX) 500 MG tablet Take 1 tablet (500 mg total) by mouth daily.   No facility-administered encounter medications on file as of 08/03/2022.     Review of Systems  Review of Systems  No chest pain with exertion.  No orthopnea or PND.  Comprehensive review of systems otherwise negative. Physical Exam  BP 120/64 (BP Location: Left Arm, Patient Position: Sitting, Cuff Size: Normal)   Pulse 91   Temp 98.4 F (36.9 C) (Oral)   Ht '4\' 11"'$  (1.499 m)    Wt 140 lb 9.6 oz (63.8 kg)   SpO2 95%   BMI 28.40 kg/m   Wt Readings from Last 5 Encounters:  08/03/22 140 lb 9.6 oz (63.8 kg)  07/11/22 139 lb 6 oz (63.2 kg)  05/27/22 137 lb (62.1 kg)  05/11/22 145 lb (65.8 kg)  03/12/22 145 lb (65.8 kg)    BMI Readings from Last 5 Encounters:  08/03/22 28.40 kg/m  07/11/22 28.15 kg/m  05/27/22 27.67 kg/m  05/11/22 29.29 kg/m  03/12/22 29.29 kg/m     Physical Exam General: Sitting in chair, chronically ill-appearing Eyes: EOMI, icterus Neck: Supple, no JVP Pulmonary: Clear, normal work of breathing Cardiovascular warm, no edema Abdomen: Nontender, bowel sounds present MSK: No synovitis, no joint effusion Neuro: Normal gait, no weakness Psych: Normal mood, full affect   Assessment & Plan:   Dyspnea on exertion: Chronic.  Suspect multifactorial related to emphysema, asthma.  Suspect deconditioning as well given issues with GI, nausea vomiting, lack of activity.  No PFTs but high suspicion for smoking-related disease, COPD.  Not improved with Symbicort.  Stop this today, trial Stiolto 2 puffs daily given emphysema.  Recommend PFTs in the future although she has a lot on her plate with ongoing issues with Aspergillus workup.  Suspect this to be performed at our [place] office in the future.  She would like to follow-up there.  We will try to arrange this.  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  Related cigarette smoking.  She was counseled to stop cigarettes.  Tobacco abuse:Smoking assessment and cessation counseling  Patient currently smoking: I have advised the patient to quit/stop smoking as soon as possible due to high risk for multiple medical problems.  It will also be very difficult for [name] to manage patient's  respiratory symptoms and status if we continue to expose her lungs to a known irritant.  We do not advise e-cigarettes as a form of stopping smoking. Patient is willing to quit smoking. I have advised  the patient that we can assist and have options of nicotine replacement therapy, provided smoking cessation education today, provided smoking cessation counseling, and provided cessation resources. Follow-up next office visit office visit for assessment of smoking cessation.  I spent 5 minutes in smoking cessation counseling.    Return in about 6 weeks (around 09/14/2022).   Lanier Clam, MD 08/03/2022

## 2022-08-17 NOTE — Telephone Encounter (Signed)
Has appointment with [facility] on 08/25/22

## 2022-08-23 ENCOUNTER — Other Ambulatory Visit: Payer: Self-pay | Admitting: Family Medicine

## 2022-08-24 ENCOUNTER — Other Ambulatory Visit: Payer: Self-pay | Admitting: Family Medicine

## 2022-08-25 ENCOUNTER — Ambulatory Visit (INDEPENDENT_AMBULATORY_CARE_PROVIDER_SITE_OTHER): Payer: 59 | Admitting: Gastroenterology

## 2022-08-25 ENCOUNTER — Other Ambulatory Visit: Payer: Self-pay

## 2022-08-25 ENCOUNTER — Encounter: Payer: Self-pay | Admitting: Gastroenterology

## 2022-08-25 VITALS — BP 133/77 | HR 102 | Temp 97.8°F | Ht 59.0 in | Wt 144.0 lb

## 2022-08-25 DIAGNOSIS — Z8719 Personal history of other diseases of the digestive system: Secondary | ICD-10-CM

## 2022-08-25 DIAGNOSIS — Z860100 Personal history of colon polyps, unspecified: Secondary | ICD-10-CM

## 2022-08-25 DIAGNOSIS — R1110 Vomiting, unspecified: Secondary | ICD-10-CM

## 2022-08-25 MED ORDER — NA SULFATE-K SULFATE-MG SULF 17.5-3.13-1.6 GM/177ML PO SOLN: 354.0000 mL | Freq: Once | ORAL | 0 refills | Status: AC

## 2022-08-25 MED ORDER — OMEPRAZOLE 40 MG PO CPDR: 40.0000 mg | DELAYED_RELEASE_CAPSULE | Freq: Every day | ORAL | 3 refills | Status: DC

## 2022-08-25 NOTE — Addendum Note (Signed)
Addended by: Wayna Chalet on: 08/25/2022 01:40 PM   Modules accepted: Orders

## 2022-08-25 NOTE — Progress Notes (Signed)
Jonathon Bellows MD, MRCP(U.K) [address]  [place]  [place], [place] [address]  Main: [phone]  Fax: [phone]   Gastroenterology Consultation  Referring Provider:     Tonia Ghent, MD Primary Care Physician:  Tonia Ghent, MD Primary Gastroenterologist:  Dr. Jonathon Bellows  Reason for Consultation:     Vomiting        HPI:   Leslie Wong is a 66 y.o. y/o female referred for consultation & management  by Dr. Damita Dunnings, Elveria Rising, MD.     She has previously seen in 2020 by [facility] gastroenterology for dyspepsia by Dr. Alice Reichert.  Colonoscopy in 2016 showed an adenomatous polyp.  EGD also performed at the same time showed esophagitis and gastritis.  She has been referred to see [name] also for colon cancer screening.  Has a history of hypothyroidism TSH in June 2023 was normal.  HbA1c of 6.3.  Presented to the ER on 30 2023 with recurrent vomiting apparently had been going on for several months and worsened over the past few weeks.  Did not tolerate Reglan in the past.  In May 2022 had a CT scan of the abdomen pelvis without contrast that showed features of pneumonia but otherwise no abnormality in the abdomen sigmoid diverticulosis was noted.  Labs in June 2023 hemoglobin 15 g CMP otherwise normal, borderline elevation of alkaline phosphatase lipase 42.  She is here today to see me with her daughter.  She has had issues with vomiting for few months.  Initially used to be occasional then became more often and it reached a point where she was throwing up every day.  At that point of time she found that she had a lot of mold at her place where she used to live and changed locations and since then all her cough has resolved in addition episodes of vomiting have significantly reduced to a single episode now and then.  She is on omeprazole 40 mg once a day that she takes before her breakfast.  No other complaints from the GI perspective.  She takes oxycodone for  pain.   Past Medical History:  Diagnosis Date   Back pain    Cancer ([place])    skin   COPD (chronic obstructive pulmonary disease) (HCC)    Depression    BAD   Gastroparesis    GERD (gastroesophageal reflux disease)    HOH (hard of hearing)    Hypothyroidism    Insomnia    Migraines    Motion sickness    Parkinson disease ([place])    per [facility], dx'd 2019   Recurrent cold sores    Shoulder bursitis    Urge incontinence    Wears dentures    full upper and lower    Past Surgical History:  Procedure Laterality Date   ABDOMINAL HYSTERECTOMY  12/26/1997   total   APPENDECTOMY     CARPAL TUNNEL RELEASE Right    and tendon repair   CATARACT EXTRACTION W/PHACO Left 10/04/2021   Procedure: CATARACT EXTRACTION PHACO AND INTRAOCULAR LENS PLACEMENT ([place]) LEFT 9.46 00:48.0;  Surgeon: Eulogio Bear, MD;  Location: [facility];  Service: Ophthalmology;  Laterality: Left;  requests early   CATARACT EXTRACTION W/PHACO Right 11/08/2021   Procedure: CATARACT EXTRACTION PHACO AND INTRAOCULAR LENS PLACEMENT ([facility]) RIGHT;  Surgeon: Eulogio Bear, MD;  Location: [facility];  Service: Ophthalmology;  Laterality: Right;  2.37 00:26.0   ESOPHAGOGASTRODUODENOSCOPY  01/26/2006  negative except small hiatal hernia   ESOPHAGOGASTRODUODENOSCOPY  02/24/2015   See report   LAPAROSCOPY     for endometriosis   OVARIAN CYST REMOVAL  12/26/1988   unilateral   TONSILLECTOMY AND ADENOIDECTOMY      Prior to Admission medications   Medication Sig Start Date End Date Taking? Authorizing Provider  albuterol (VENTOLIN HFA) 108 (90 Base) MCG/ACT inhaler Inhale 2 puffs into the lungs every 6 (six) hours as needed for wheezing or shortness of breath. 03/12/22   Rodriguez-Southworth, Sunday Spillers, PA-C  amitriptyline (ELAVIL) 25 MG tablet Take 25 mg by mouth at bedtime as needed for sleep (ORIGINAL XTK:WIOX 1 TO 2 TABLETS BY MOUTH AT BEDTIME FOR SLEEP OR PAIN).    [provider]   amoxicillin-clavulanate (AUGMENTIN) 875-125 MG tablet Take 1 tablet by mouth 2 (two) times daily. 07/11/22   Eugenia Pancoast, FNP  budesonide-formoterol (SYMBICORT) 80-4.5 MCG/ACT inhaler Bid 03/12/22   Rodriguez-Southworth, Sunday Spillers, PA-C  buprenorphine (BUTRANS) 10 MCG/HR PTWK Place 1 patch onto the skin once a week.    [provider]  buPROPion (WELLBUTRIN XL) 150 MG 24 hr tablet Take 150 mg by mouth every morning. 11/19/21   [provider]  Cholecalciferol 25 MCG (1000 UT) capsule Take 1,000 Units by mouth daily.    [provider]  fluticasone (FLONASE) 50 MCG/ACT nasal spray Place 2 sprays into both nostrils daily. 07/11/22   Eugenia Pancoast, FNP  hydrOXYzine (ATARAX/VISTARIL) 50 MG tablet Take 1 tablet (50 mg total) by mouth 3 (three) times daily as needed for anxiety. 12/29/20   [name], [name], MD  ipratropium-albuterol (DUONEB) 0.5-2.5 (3) MG/3ML SOLN Take 3 mLs by nebulization every 4 (four) hours as needed. For wheezing and cough 03/12/22   Rodriguez-Southworth, Sunday Spillers, PA-C  levothyroxine (SYNTHROID) 88 MCG tablet TAKE 1 TABLET(88 MCG) BY MOUTH DAILY AT 6 AM 08/24/22   Tonia Ghent, MD  magic mouthwash SOLN Take 5 mLs by mouth 3 (three) times daily as needed for mouth pain. 05/27/22   Tonia Ghent, MD  mirtazapine (REMERON) 30 MG tablet Take 30 mg by mouth at bedtime. 02/23/22   [provider]  Multiple Vitamins-Minerals (MULTIVITAMIN ADULTS PO) Take by mouth.    [provider]  OLANZapine (ZYPREXA) 15 MG tablet Take by mouth. 05/10/21   [provider]  Omega-3 Fatty Acids (FISH OIL) 1000 MG CAPS Take by mouth daily.    [provider]  omeprazole (PRILOSEC) 40 MG capsule Take 1 capsule (40 mg total) by mouth daily. 05/27/22   Tonia Ghent, MD  ondansetron (ZOFRAN-ODT) 8 MG disintegrating tablet Take 8 mg by mouth 2 (two) times daily. 11/24/21   [provider]  Oxybutynin Chloride (GELNIQUE) 10 % GEL Place onto the  skin.    [provider]  Oxycodone HCl 10 MG TABS Take 10 mg by mouth every 6 (six) hours as needed for pain. 05/12/21   [provider]  Tiotropium Bromide-Olodaterol (STIOLTO RESPIMAT) 2.5-2.5 MCG/ACT AERS Inhale 2 puffs into the lungs daily. 08/03/22   Hunsucker, Bonna Gains, MD  tiZANidine (ZANAFLEX) 4 MG tablet Take by mouth. 06/04/21   [provider]  valACYclovir (VALTREX) 500 MG tablet Take 1 tablet (500 mg total) by mouth daily. 09/15/21   Tonia Ghent, MD    Family History  Problem Relation Age of Onset   Hypertension Mother    Arthritis Mother    Diabetes Mother    Stroke Mother    Cancer Father  lung   Colon cancer Neg Hx    Breast cancer Neg Hx      Social History   Tobacco Use   Smoking status: Some Days    Packs/day: 0.25    Years: 42.50    Total pack years: 10.63    Types: Cigarettes, E-cigarettes   Smokeless tobacco: Former   Tobacco comments:    Pt states she smokes 1/2 ppd and she does vape. April Smith RN BSN 08/03/22  Vaping Use   Vaping Use: Some days  Substance Use Topics   Alcohol use: No    Alcohol/week: 0.0 standard drinks of alcohol   Drug use: No    Allergies as of 08/25/2022 - Review Complete 08/03/2022  Allergen Reaction Noted   Doxepin Other (See Comments) 12/11/2019   Valproic acid Shortness Of Breath 07/30/2015   Aspirin  06/05/2007   Atorvastatin Other (See Comments) 02/28/2019   Baclofen     Buprenorphine  04/26/2021   Duloxetine Other (See Comments) 09/30/2018   Erythromycin  06/05/2007   Gabapentin     Metoclopramide hcl  05/12/2009   Nortriptyline  11/13/2018   Nsaids Other (See Comments) 10/20/2011   Nucynta er [tapentadol hcl er]  01/11/2012   Nucynta [tapentadol]  07/24/2012   Prednisone  06/05/2007   Pregabalin  06/15/2010   Seroquel [quetiapine fumerate] Other (See Comments) 07/06/2011   Sulfa antibiotics Nausea And Vomiting 05/26/2011   Topamax Other (See Comments) 09/28/2011   Vicodin  [hydrocodone-acetaminophen]  04/23/2013    Review of Systems:    All systems reviewed and negative except where noted in HPI.   Physical Exam:  There were no vitals taken for this visit. No LMP recorded. Patient has had a hysterectomy. Psych:  Alert and cooperative. Normal mood and affect. General:   Alert,  Well-developed, well-nourished, pleasant and cooperative in NAD Head:  Normocephalic and atraumatic.    Neurologic:  Alert and oriented x3;  grossly normal neurologically. Psych:  Alert and cooperative. Normal mood and affect.  Imaging Studies: No results found.  Assessment and Plan:   Johnesha Acheampong is a 66 y.o. y/o female has been referred for recurrent vomiting and colon cancer screening.  History of adenomatous polyps resected in 2016 due for colonoscopy.  History of recurrent vomiting very likely secondary to cough which may have been triggered from mold at her apartment.  Since changing locations the cough has resolved along with the vomiting.  Also possible that the vomiting caused significant esophagitis which has resolved with her omeprazole use.   Plan 1.  Continue omeprazole 40 mg once a day 90-day prescription with 3 refills will be provided at next visit we will reduce the dose if possible. 2.  EGD and colonoscopy to rule out gastric outlet obstruction and surveillance due to personal history of colon polyps. 3.  HIDA scan if vomiting recurs   I have discussed alternative options, risks & benefits,  which include, but are not limited to, bleeding, infection, perforation,respiratory complication & drug reaction.  The patient agrees with this plan & written consent will be obtained.     Follow up in 12-16 weeks  Dr Jonathon Bellows MD,MRCP(U.K)

## 2022-09-02 ENCOUNTER — Ambulatory Visit
Admission: RE | Admit: 2022-09-02 | Discharge: 2022-09-02 | Disposition: A | Discharge status: Home / Self Care | Payer: 59 | Source: Ambulatory Visit | Attending: Acute Care | Admitting: Acute Care

## 2022-09-02 DIAGNOSIS — Z87891 Personal history of nicotine dependence: Secondary | ICD-10-CM | POA: Diagnosis present

## 2022-09-02 DIAGNOSIS — F17210 Nicotine dependence, cigarettes, uncomplicated: Secondary | ICD-10-CM | POA: Diagnosis present

## 2022-09-05 ENCOUNTER — Other Ambulatory Visit: Payer: Self-pay | Admitting: Acute Care

## 2022-09-05 DIAGNOSIS — Z87891 Personal history of nicotine dependence: Secondary | ICD-10-CM

## 2022-09-05 DIAGNOSIS — Z122 Encounter for screening for malignant neoplasm of respiratory organs: Secondary | ICD-10-CM

## 2022-09-05 DIAGNOSIS — F17210 Nicotine dependence, cigarettes, uncomplicated: Secondary | ICD-10-CM

## 2022-09-06 ENCOUNTER — Encounter: Payer: Self-pay | Admitting: Gastroenterology

## 2022-09-07 ENCOUNTER — Telehealth: Payer: Self-pay | Admitting: Family Medicine

## 2022-09-07 ENCOUNTER — Ambulatory Visit: Payer: 59 | Admitting: Anesthesiology

## 2022-09-07 ENCOUNTER — Encounter: Payer: Self-pay | Admitting: Gastroenterology

## 2022-09-07 ENCOUNTER — Ambulatory Visit
Admission: RE | Admit: 2022-09-07 | Discharge: 2022-09-07 | Disposition: A | Discharge status: Home / Self Care | Payer: 59 | Source: Ambulatory Visit | Attending: Gastroenterology | Admitting: Gastroenterology

## 2022-09-07 ENCOUNTER — Encounter
Admission: RE | Disposition: A | Discharge status: Home / Self Care | Payer: Self-pay | Source: Ambulatory Visit | Attending: Gastroenterology

## 2022-09-07 DIAGNOSIS — Z860100 Personal history of colon polyps, unspecified: Secondary | ICD-10-CM

## 2022-09-07 DIAGNOSIS — E039 Hypothyroidism, unspecified: Secondary | ICD-10-CM | POA: Diagnosis not present

## 2022-09-07 DIAGNOSIS — K449 Diaphragmatic hernia without obstruction or gangrene: Secondary | ICD-10-CM | POA: Diagnosis not present

## 2022-09-07 DIAGNOSIS — K5730 Diverticulosis of large intestine without perforation or abscess without bleeding: Secondary | ICD-10-CM | POA: Diagnosis not present

## 2022-09-07 DIAGNOSIS — Z1211 Encounter for screening for malignant neoplasm of colon: Secondary | ICD-10-CM | POA: Insufficient documentation

## 2022-09-07 DIAGNOSIS — J449 Chronic obstructive pulmonary disease, unspecified: Secondary | ICD-10-CM | POA: Insufficient documentation

## 2022-09-07 DIAGNOSIS — R1110 Vomiting, unspecified: Secondary | ICD-10-CM

## 2022-09-07 DIAGNOSIS — F17210 Nicotine dependence, cigarettes, uncomplicated: Secondary | ICD-10-CM | POA: Diagnosis not present

## 2022-09-07 DIAGNOSIS — K219 Gastro-esophageal reflux disease without esophagitis: Secondary | ICD-10-CM | POA: Diagnosis not present

## 2022-09-07 DIAGNOSIS — Z8719 Personal history of other diseases of the digestive system: Secondary | ICD-10-CM

## 2022-09-07 DIAGNOSIS — F319 Bipolar disorder, unspecified: Secondary | ICD-10-CM | POA: Insufficient documentation

## 2022-09-07 HISTORY — PX: COLONOSCOPY WITH PROPOFOL: SHX5780

## 2022-09-07 HISTORY — PX: ESOPHAGOGASTRODUODENOSCOPY: SHX5428

## 2022-09-07 SURGERY — COLONOSCOPY WITH PROPOFOL
Anesthesia: General

## 2022-09-07 MED ORDER — PROPOFOL 10 MG/ML IV BOLUS
INTRAVENOUS | Status: DC | PRN
  Administered 2022-09-07 (×3): 20 mg via INTRAVENOUS
  Administered 2022-09-07: 60 mg via INTRAVENOUS

## 2022-09-07 MED ORDER — LIDOCAINE HCL (CARDIAC) PF 100 MG/5ML IV SOSY
PREFILLED_SYRINGE | INTRAVENOUS | Status: DC | PRN
  Administered 2022-09-07: 100 mg via INTRAVENOUS

## 2022-09-07 MED ORDER — SODIUM CHLORIDE 0.9 % IV SOLN: INTRAVENOUS | Status: DC

## 2022-09-07 MED ORDER — ESMOLOL HCL 100 MG/10ML IV SOLN
INTRAVENOUS | Status: DC | PRN
  Administered 2022-09-07: 10 mg via INTRAVENOUS

## 2022-09-07 MED ORDER — PROPOFOL 500 MG/50ML IV EMUL
INTRAVENOUS | Status: DC | PRN
  Administered 2022-09-07: 145 ug/kg/min via INTRAVENOUS

## 2022-09-07 NOTE — Anesthesia Preprocedure Evaluation (Signed)
Anesthesia Evaluation  Patient identified by MRN, date of birth, ID band Patient awake    Reviewed: Allergy & Precautions, NPO status , Patient's Chart, lab work & pertinent test results  History of Anesthesia Complications (+) PONV and history of anesthetic complications  Airway Mallampati: I  TM Distance: >3 FB Neck ROM: Full    Dental  (+) Edentulous Lower, Edentulous Upper   Pulmonary COPD,  COPD inhaler, Current Smoker and Patient abstained from smoking.,    Pulmonary exam normal breath sounds clear to auscultation       Cardiovascular Exercise Tolerance: Good negative cardio ROS Normal cardiovascular exam Rhythm:Regular Rate:Normal     Neuro/Psych  Headaches, PSYCHIATRIC DISORDERS Depression Bipolar Disorder    GI/Hepatic Neg liver ROS, hiatal hernia, GERD  ,Gastroparesis    Endo/Other  Hypothyroidism   Renal/GU negative Renal ROS  negative genitourinary   Musculoskeletal  (+) Arthritis , Chronic back pain    Abdominal   Peds negative pediatric ROS (+)  Hematology negative hematology ROS (+)   Anesthesia Other Findings  Motion sickness   Reproductive/Obstetrics negative OB ROS                             Anesthesia Physical Anesthesia Plan  ASA: 3  Anesthesia Plan: General   Post-op Pain Management: Minimal or no pain anticipated   Induction: Intravenous  PONV Risk Score and Plan: 2 and Propofol infusion and TIVA  Airway Management Planned: Natural Airway and Nasal Cannula  Additional Equipment:   Intra-op Plan:   Post-operative Plan:   Informed Consent: I have reviewed the patients History and Physical, chart, labs and discussed the procedure including the risks, benefits and alternatives for the proposed anesthesia with the patient or authorized representative who has indicated his/her understanding and acceptance.     Dental Advisory Given  Plan Discussed  with: Anesthesiologist, CRNA and Surgeon  Anesthesia Plan Comments: (Patient consented for risks of anesthesia including but not limited to:  - adverse reactions to medications - risk of airway placement if required - damage to eyes, teeth, lips or other oral mucosa - nerve damage due to positioning  - sore throat or hoarseness - Damage to heart, brain, nerves, lungs, other parts of body or loss of life  Patient voiced understanding.)        Anesthesia Quick Evaluation

## 2022-09-07 NOTE — Telephone Encounter (Signed)
Patient daughter came in office and dropped off and dropped off [facility] paperwork for PCP to fill out and has been placed in PCP folder. Call back number [phone]

## 2022-09-07 NOTE — Anesthesia Procedure Notes (Signed)
Procedure Name: General with mask airway Date/Time: 09/07/2022 1:46 PM  Performed by: Kelton Pillar, CRNAPre-anesthesia Checklist: Patient identified, Emergency Drugs available, Suction available and Patient being monitored Patient Re-evaluated:Patient Re-evaluated prior to induction Oxygen Delivery Method: Simple face mask Induction Type: IV induction Placement Confirmation: positive ETCO2, CO2 detector and breath sounds checked- equal and bilateral Dental Injury: Teeth and Oropharynx as per pre-operative assessment

## 2022-09-07 NOTE — H&P (Signed)
Jonathon Bellows, MD [address], [place], [place], [place], [address] [address], [place], [place], [place], [address] Phone: [phone]  Fax: [phone]  Primary Care Physician:  Tonia Ghent, MD   Pre-Procedure History & Physical: HPI:  Leslie Wong is a 66 y.o. female is here for an endoscopy and colonoscopy    Past Medical History:  Diagnosis Date   Back pain    Cancer ([place])    skin   COPD (chronic obstructive pulmonary disease) ([place])    Depression    BAD   Gastroparesis    GERD (gastroesophageal reflux disease)    HOH (hard of hearing)    Hypothyroidism    Insomnia    Migraines    Motion sickness    Parkinson disease ([place])    per [facility], dx'd 2019   Recurrent cold sores    Shoulder bursitis    Urge incontinence    Wears dentures    full upper and lower    Past Surgical History:  Procedure Laterality Date   ABDOMINAL HYSTERECTOMY  12/26/1997   total   APPENDECTOMY     CARPAL TUNNEL RELEASE Right    and tendon repair   CATARACT EXTRACTION W/PHACO Left 10/04/2021   Procedure: CATARACT EXTRACTION PHACO AND INTRAOCULAR LENS PLACEMENT ([place]) LEFT 9.46 00:48.0;  Surgeon: Eulogio Bear, MD;  Location: [facility];  Service: Ophthalmology;  Laterality: Left;  requests early   CATARACT EXTRACTION W/PHACO Right 11/08/2021   Procedure: CATARACT EXTRACTION PHACO AND INTRAOCULAR LENS PLACEMENT ([facility]) RIGHT;  Surgeon: Eulogio Bear, MD;  Location: [facility];  Service: Ophthalmology;  Laterality: Right;  2.37 00:26.0   ESOPHAGOGASTRODUODENOSCOPY  01/26/2006   negative except small hiatal hernia   ESOPHAGOGASTRODUODENOSCOPY  02/24/2015   See report   LAPAROSCOPY     for endometriosis   OVARIAN CYST REMOVAL  12/26/1988   unilateral   TONSILLECTOMY AND ADENOIDECTOMY      Prior to Admission medications   Medication Sig Start Date End Date Taking? Authorizing Provider  amitriptyline (ELAVIL) 25 MG tablet Take 25  mg by mouth at bedtime as needed for sleep (ORIGINAL CWC:BJSE 1 TO 2 TABLETS BY MOUTH AT BEDTIME FOR SLEEP OR PAIN).   Yes [provider]  budesonide-formoterol (SYMBICORT) 80-4.5 MCG/ACT inhaler Bid 03/12/22  Yes Rodriguez-Southworth, Sunday Spillers, PA-C  buprenorphine (BUTRANS) 15 MCG/HR 1 patch once a week. 08/25/22  Yes [provider]  buPROPion (WELLBUTRIN XL) 150 MG 24 hr tablet Take 150 mg by mouth every morning. 11/19/21  Yes [provider]  busPIRone (BUSPAR) 7.5 MG tablet Take 1 tablet by mouth daily. 08/17/22  Yes [provider]  Cholecalciferol 25 MCG (1000 UT) capsule Take 1,000 Units by mouth daily.   Yes [provider]  levothyroxine (SYNTHROID) 88 MCG tablet TAKE 1 TABLET(88 MCG) BY MOUTH DAILY AT 6 AM 08/24/22  Yes Tonia Ghent, MD  mirtazapine (REMERON) 30 MG tablet Take 30 mg by mouth at bedtime. 02/23/22  Yes [provider]  Multiple Vitamins-Minerals (MULTIVITAMIN ADULTS PO) Take by mouth.   Yes [provider]  OLANZapine (ZYPREXA) 15 MG tablet Take by mouth. 05/10/21  Yes [provider]  omeprazole (PRILOSEC) 40 MG capsule Take 1 capsule (40 mg total) by mouth daily. 08/25/22  Yes Jonathon Bellows, MD  tiZANidine (ZANAFLEX) 4 MG tablet Take by mouth. 06/04/21  Yes [provider]  albuterol (VENTOLIN HFA) 108 (90 Base) MCG/ACT inhaler Inhale 2 puffs  into the lungs every 6 (six) hours as needed for wheezing or shortness of breath. 03/12/22   Rodriguez-Southworth, Sunday Spillers, PA-C  amoxicillin-clavulanate (AUGMENTIN) 875-125 MG tablet Take 1 tablet by mouth 2 (two) times daily. Patient not taking: Reported on 09/07/2022 07/11/22   Eugenia Pancoast, FNP  fluticasone (FLONASE) 50 MCG/ACT nasal spray Place 2 sprays into both nostrils daily. 07/11/22   Eugenia Pancoast, FNP  hydrOXYzine (ATARAX/VISTARIL) 50 MG tablet Take 1 tablet (50 mg total) by mouth 3 (three) times daily as needed for anxiety. 12/29/20   [name], [name], MD   ipratropium-albuterol (DUONEB) 0.5-2.5 (3) MG/3ML SOLN Take 3 mLs by nebulization every 4 (four) hours as needed. For wheezing and cough 03/12/22   Rodriguez-Southworth, Sunday Spillers, PA-C  magic mouthwash SOLN Take 5 mLs by mouth 3 (three) times daily as needed for mouth pain. 05/27/22   Tonia Ghent, MD  Omega-3 Fatty Acids (FISH OIL) 1000 MG CAPS Take by mouth daily.    [provider]  ondansetron (ZOFRAN-ODT) 8 MG disintegrating tablet Take 8 mg by mouth 2 (two) times daily. 11/24/21   [provider]  Oxybutynin Chloride (GELNIQUE) 10 % GEL Place onto the skin.    [provider]  Oxycodone HCl 10 MG TABS Take 10 mg by mouth every 6 (six) hours as needed for pain. 05/12/21   [provider]  predniSONE (DELTASONE) 20 MG tablet Take 20 mg by mouth daily. Patient not taking: Reported on 09/07/2022 07/26/22   [provider]  Tiotropium Bromide-Olodaterol (STIOLTO RESPIMAT) 2.5-2.5 MCG/ACT AERS Inhale 2 puffs into the lungs daily. 08/03/22   Hunsucker, Bonna Gains, MD  valACYclovir (VALTREX) 500 MG tablet Take 1 tablet (500 mg total) by mouth daily. 09/15/21   Tonia Ghent, MD    Allergies as of 08/25/2022 - Review Complete 08/25/2022  Allergen Reaction Noted   Doxepin Other (See Comments) 12/11/2019   Valproic acid Shortness Of Breath 07/30/2015   Aspirin  06/05/2007   Atorvastatin Other (See Comments) 02/28/2019   Baclofen     Buprenorphine  04/26/2021   Duloxetine Other (See Comments) 09/30/2018   Erythromycin  06/05/2007   Gabapentin     Metoclopramide hcl  05/12/2009   Nortriptyline  11/13/2018   Nsaids Other (See Comments) 10/20/2011   Nucynta er [tapentadol hcl er]  01/11/2012   Nucynta [tapentadol]  07/24/2012   Prednisone  06/05/2007   Pregabalin  06/15/2010   Seroquel [quetiapine fumerate] Other (See Comments) 07/06/2011   Sulfa antibiotics Nausea And Vomiting 05/26/2011   Topamax Other (See Comments) 09/28/2011   Vicodin  [hydrocodone-acetaminophen]  04/23/2013    Family History  Problem Relation Age of Onset   Hypertension Mother    Arthritis Mother    Diabetes Mother    Stroke Mother    Cancer Father        lung   Colon cancer Neg Hx    Breast cancer Neg Hx     Social History   Socioeconomic History   Marital status: Single    Spouse name: Not on file   Number of children: Not on file   Years of education: Not on file   Highest education level: Not on file  Occupational History   Not on file  Tobacco Use   Smoking status: Every Day    Packs/day: 0.25    Years: 42.50    Total pack years: 10.63    Types: Cigarettes, E-cigarettes   Smokeless tobacco: Former   Tobacco comments:  Pt states she smokes 1/2 ppd and she does vape. April Smith RN BSN 08/03/22  Vaping Use   Vaping Use: Some days  Substance and Sexual Activity   Alcohol use: No    Alcohol/week: 0.0 standard drinks of alcohol   Drug use: No   Sexual activity: Never  Other Topics Concern   Not on file  Social History Narrative   Lives alone.     Has a dog names Dixie   Social Determinants of Health   Financial Resource Strain: Low Risk  (05/11/2022)   Overall Financial Resource Strain (CARDIA)    Difficulty of Paying Living Expenses: Not hard at all  Food Insecurity: No Food Insecurity (05/11/2022)   Hunger Vital Sign    Worried About Running Out of Food in the Last Year: Never true    Ran Out of Food in the Last Year: Never true  Transportation Needs: No Transportation Needs (05/11/2022)   PRAPARE - Hydrologist (Medical): No    Lack of Transportation (Non-Medical): No  Physical Activity: Insufficiently Active (05/11/2022)   Exercise Vital Sign    Days of Exercise per Week: 2 days    Minutes of Exercise per Session: 20 min  Stress: No Stress Concern Present (05/11/2022)   [place]    Feeling of Stress : Not at all  Social  Connections: Socially Isolated (05/11/2022)   Social Connection and Isolation Panel [NHANES]    Frequency of Communication with Friends and Family: More than three times a week    Frequency of Social Gatherings with Friends and Family: More than three times a week    Attends Religious Services: Never    Marine scientist or Organizations: No    Attends Archivist Meetings: Never    Marital Status: Divorced  Human resources officer Violence: Not At Risk (05/11/2022)   Humiliation, Afraid, Rape, and Kick questionnaire    Fear of Current or Ex-Partner: No    Emotionally Abused: No    Physically Abused: No    Sexually Abused: No    Review of Systems: See HPI, otherwise negative ROS  Physical Exam: BP (!) 134/99   Pulse (!) 104   Temp (!) 96.8 F (36 C) (Temporal)   Resp 20   Ht '4\' 11"'$  (1.499 m)   Wt 62.1 kg   SpO2 94%   BMI 27.67 kg/m  General:   Alert,  pleasant and cooperative in NAD Head:  Normocephalic and atraumatic. Neck:  Supple; no masses or thyromegaly. Lungs:  Clear throughout to auscultation, normal respiratory effort.    Heart:  +S1, +S2, Regular rate and rhythm, No edema. Abdomen:  Soft, nontender and nondistended. Normal bowel sounds, without guarding, and without rebound.   Neurologic:  Alert and  oriented x4;  grossly normal neurologically.  Impression/Plan: Leslie Wong is here for an endoscopy and colonoscopy  to be performed for  evaluation of vomiting and history of colon polyps     Risks, benefits, limitations, and alternatives regarding endoscopy have been reviewed with the patient.  Questions have been answered.  All parties agreeable.   Jonathon Bellows, MD  09/07/2022, 1:08 PM

## 2022-09-07 NOTE — Telephone Encounter (Signed)
Form placed in Dr. Duncans inbox 

## 2022-09-07 NOTE — Op Note (Signed)
[facility] Gastroenterology Patient Name: Leslie Wong Procedure Date: 09/07/2022 1:26 PM MRN: [id number] Account #: [id number] Date of Birth: 01-31-1956 Admit Type: Outpatient Age: 66 Room: [facility] ENDO ROOM 2 Gender: Female Note Status: Finalized Instrument Name: Jasper Riling [id number] Procedure:             Colonoscopy Indications:           Screening for colorectal malignant neoplasm Providers:             Jonathon Bellows MD, MD Medicines:             Monitored Anesthesia Care Complications:         No immediate complications. Procedure:             Pre-Anesthesia Assessment:                        - Prior to the procedure, a History and Physical was                         performed, and patient medications, allergies and                         sensitivities were reviewed. The patient's tolerance                         of previous anesthesia was reviewed.                        - The risks and benefits of the procedure and the                         sedation options and risks were discussed with the                         patient. All questions were answered and informed                         consent was obtained.                        - ASA Grade Assessment: II - A patient with mild                         systemic disease.                        After obtaining informed consent, the colonoscope was                         passed under direct vision. Throughout the procedure,                         the patient's blood pressure, pulse, and oxygen                         saturations were monitored continuously. The                         Colonoscope was introduced through the anus and  advanced to the the cecum, identified by the                         appendiceal orifice. The colonoscopy was performed                         without difficulty. The patient tolerated the                         procedure well. The quality of the  bowel preparation                         was good. Findings:      The perianal and digital rectal examinations were normal.      Multiple small-mouthed diverticula were found in the sigmoid colon.      The exam was otherwise without abnormality on direct and retroflexion       views. Impression:            - Diverticulosis in the sigmoid colon.                        - The examination was otherwise normal on direct and                         retroflexion views.                        - No specimens collected. Recommendation:        - Discharge patient to home (with escort).                        - Resume previous diet.                        - Continue present medications.                        - Repeat colonoscopy in 10 years for screening                         purposes. Procedure Code(s):     --- Professional ---                        [phone], Colonoscopy, flexible; diagnostic, including                         collection of specimen(s) by brushing or washing, when                         performed (separate procedure) Diagnosis Code(s):     --- Professional ---                        Z12.11, Encounter for screening for malignant neoplasm                         of colon                        K57.30, Diverticulosis of large intestine without  perforation or abscess without bleeding CPT copyright 2019 American Medical Association. All rights reserved. The codes documented in this report are preliminary and upon coder review may  be revised to meet current compliance requirements. Jonathon Bellows, MD Jonathon Bellows MD, MD 09/07/2022 1:53:27 PM This report has been signed electronically. Number of Addenda: 0 Note Initiated On: 09/07/2022 1:26 PM Scope Withdrawal Time: 0 hours 7 minutes 58 seconds  Total Procedure Duration: 0 hours 13 minutes 16 seconds  Estimated Blood Loss:  Estimated blood loss: none.      [facility]

## 2022-09-07 NOTE — Anesthesia Postprocedure Evaluation (Signed)
Anesthesia Post Note  Patient: Leslie Wong  Procedure(s) Performed: COLONOSCOPY WITH PROPOFOL ESOPHAGOGASTRODUODENOSCOPY (EGD)  Patient location during evaluation: PACU Anesthesia Type: General Level of consciousness: awake and alert, oriented and patient cooperative Pain management: pain level controlled Vital Signs Assessment: post-procedure vital signs reviewed and stable Respiratory status: spontaneous breathing, nonlabored ventilation and respiratory function stable Cardiovascular status: blood pressure returned to baseline and stable Postop Assessment: adequate PO intake Anesthetic complications: no   No notable events documented.   Last Vitals:  Vitals:   09/07/22 1402 09/07/22 1408  BP: (!) 143/101 (!) 148/79  Pulse: (!) 107 (!) 104  Resp: (!) 30 (!) 24  Temp:    SpO2: 93% 94%    Last Pain:  Vitals:   09/07/22 1408  TempSrc:   PainSc: 0-No pain                 Darrin Nipper

## 2022-09-07 NOTE — Op Note (Signed)
[facility] Gastroenterology Patient Name: Leslie Wong Procedure Date: 09/07/2022 1:27 PM MRN: [id number] Account #: [id number] Date of Birth: 1956/01/22 Admit Type: Outpatient Age: 66 Room: [facility] ENDO ROOM 2 Gender: Female Note Status: Finalized Instrument Name: Altamese Cabal Endoscope [id number] Procedure:             Upper GI endoscopy Indications:           Vomiting Providers:             Jonathon Bellows MD, MD Medicines:             Monitored Anesthesia Care Complications:         No immediate complications. Procedure:             Pre-Anesthesia Assessment:                        - Prior to the procedure, a History and Physical was                         performed, and patient medications, allergies and                         sensitivities were reviewed. The patient's tolerance                         of previous anesthesia was reviewed.                        - The risks and benefits of the procedure and the                         sedation options and risks were discussed with the                         patient. All questions were answered and informed                         consent was obtained.                        - ASA Grade Assessment: II - A patient with mild                         systemic disease.                        After obtaining informed consent, the endoscope was                         passed under direct vision. Throughout the procedure,                         the patient's blood pressure, pulse, and oxygen                         saturations were monitored continuously. The Endoscope                         was introduced through the mouth, and advanced to the  third part of duodenum. The upper GI endoscopy was                         accomplished with ease. The patient tolerated the                         procedure well. Findings:      The esophagus was normal.      The stomach was normal.      The examined  duodenum was normal. Impression:            - Normal esophagus.                        - Normal stomach.                        - Normal examined duodenum.                        - No specimens collected. Recommendation:        - Perform a colonoscopy today. Procedure Code(s):     --- Professional ---                        [phone], Esophagogastroduodenoscopy, flexible,                         transoral; diagnostic, including collection of                         specimen(s) by brushing or washing, when performed                         (separate procedure) Diagnosis Code(s):     --- Professional ---                        R11.10, Vomiting, unspecified CPT copyright 2019 American Medical Association. All rights reserved. The codes documented in this report are preliminary and upon coder review may  be revised to meet current compliance requirements. Jonathon Bellows, MD Jonathon Bellows MD, MD 09/07/2022 1:36:30 PM This report has been signed electronically. Number of Addenda: 0 Note Initiated On: 09/07/2022 1:27 PM Estimated Blood Loss:  Estimated blood loss: none.      [facility]

## 2022-09-07 NOTE — Transfer of Care (Signed)
Immediate Anesthesia Transfer of Care Note  Patient: Leslie Wong  Procedure(s) Performed: COLONOSCOPY WITH PROPOFOL ESOPHAGOGASTRODUODENOSCOPY (EGD)  Patient Location: Endoscopy Unit  Anesthesia Type:General  Level of Consciousness: drowsy and patient cooperative  Airway & Oxygen Therapy: Patient Spontanous Breathing and Patient connected to face mask oxygen  Post-op Assessment: Report given to RN and Post -op Vital signs reviewed and stable  Post vital signs: Reviewed and stable  Last Vitals:  Vitals Value Taken Time  BP 151/100 09/07/22 1358  Temp 36 C 09/07/22 1358  Pulse 108 09/07/22 1400  Resp 31 09/07/22 1400  SpO2 94 % 09/07/22 1400  Vitals shown include unvalidated device data.  Last Pain:  Vitals:   09/07/22 1358  TempSrc: Temporal  PainSc: Asleep         Complications: No notable events documented.

## 2022-09-14 NOTE — Telephone Encounter (Signed)
Form placed up front for pick up and patient is aware.

## 2022-10-05 ENCOUNTER — Ambulatory Visit: Payer: 59 | Admitting: Pulmonary Disease

## 2022-10-05 ENCOUNTER — Telehealth: Payer: Self-pay | Admitting: Family Medicine

## 2022-10-05 NOTE — Telephone Encounter (Signed)
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 Damita Dunnings could prescribe something for her because Nystatin is making it burn worse. She stated her dentist prescribed the Nystatin. Please advise. Thank you!

## 2022-10-06 ENCOUNTER — Other Ambulatory Visit: Payer: Self-pay | Admitting: Family Medicine

## 2022-10-06 NOTE — Telephone Encounter (Signed)
Appt scheduled for 10/14/22

## 2022-10-06 NOTE — Telephone Encounter (Signed)
If nystatin isn't helping then I think she needs to be checked first.

## 2022-10-06 NOTE — Telephone Encounter (Signed)
Spoke with patients daughter [name]; advised her patient needs appt. She is going to get with her mom and look at both of their schedules and call back to make her an appt.

## 2022-10-14 ENCOUNTER — Ambulatory Visit (INDEPENDENT_AMBULATORY_CARE_PROVIDER_SITE_OTHER): Payer: 59 | Admitting: Family Medicine

## 2022-10-14 ENCOUNTER — Encounter: Payer: Self-pay | Admitting: Family Medicine

## 2022-10-14 VITALS — BP 122/80 | HR 91 | Temp 97.5°F | Ht 59.0 in | Wt 139.0 lb

## 2022-10-14 DIAGNOSIS — R739 Hyperglycemia, unspecified: Secondary | ICD-10-CM

## 2022-10-14 DIAGNOSIS — R682 Dry mouth, unspecified: Secondary | ICD-10-CM

## 2022-10-14 DIAGNOSIS — Z23 Encounter for immunization: Secondary | ICD-10-CM

## 2022-10-14 DIAGNOSIS — R5383 Other fatigue: Secondary | ICD-10-CM | POA: Diagnosis not present

## 2022-10-14 LAB — CBC WITH DIFFERENTIAL/PLATELET
Basophils Absolute: 0.1 10*3/uL (ref 0.0–0.1)
Basophils Relative: 0.7 % (ref 0.0–3.0)
Eosinophils Absolute: 0.1 10*3/uL (ref 0.0–0.7)
Eosinophils Relative: 0.7 % (ref 0.0–5.0)
HCT: 44.8 % (ref 36.0–46.0)
Hemoglobin: 14.7 g/dL (ref 12.0–15.0)
Lymphocytes Relative: 24.1 % (ref 12.0–46.0)
Lymphs Abs: 2.5 10*3/uL (ref 0.7–4.0)
MCHC: 32.7 g/dL (ref 30.0–36.0)
MCV: 89.9 fl (ref 78.0–100.0)
Monocytes Absolute: 0.7 10*3/uL (ref 0.1–1.0)
Monocytes Relative: 7.2 % (ref 3.0–12.0)
Neutro Abs: 7 10*3/uL (ref 1.4–7.7)
Neutrophils Relative %: 67.3 % (ref 43.0–77.0)
Platelets: 238 10*3/uL (ref 150.0–400.0)
RBC: 4.99 Mil/uL (ref 3.87–5.11)
RDW: 14.9 % (ref 11.5–15.5)
WBC: 10.4 10*3/uL (ref 4.0–10.5)

## 2022-10-14 LAB — HEMOGLOBIN A1C: Hgb A1c MFr Bld: 7 % — ABNORMAL HIGH (ref 4.6–6.5)

## 2022-10-14 LAB — TSH: TSH: 0.5 u[IU]/mL (ref 0.35–5.50)

## 2022-10-14 MED ORDER — LIDOCAINE VISCOUS HCL 2 % MT SOLN: 15.0000 mL | Freq: Four times a day (QID) | OROMUCOSAL | 1 refills | Status: DC | PRN

## 2022-10-14 MED ORDER — LIDOCAINE VISCOUS HCL 2 % MT SOLN: 5.0000 mL | Freq: Four times a day (QID) | OROMUCOSAL | 1 refills | Status: DC | PRN

## 2022-10-14 MED ORDER — VALACYCLOVIR HCL 500 MG PO TABS: 500.0000 mg | ORAL_TABLET | Freq: Every day | ORAL | 1 refills | Status: DC

## 2022-10-14 NOTE — Patient Instructions (Signed)
Go to the lab on the way out.   If you have mychart we'll likely use that to update you.    Try leaving the lower plate out for 3-5 days.  See if that helps.  Use plain lidocaine- swish and spit- if needed for the pain.  Take care.  Glad to see you.

## 2022-10-14 NOTE — Progress Notes (Unsigned)
Burning sensation in the mouth.  B mouth, tongue.  Tried nystatin w/o relief.  [facility] helped a little then stopped providing benefit.  She feels better with bottom plate out.  More pain after recent plate change.    Fatigued.  She had to move out of her apartment due to mold in the structure.  She is back in her hold apartment now.  Interior was redone.    She is off amitriptyline in the meantime.  She thought the mouth pain was some better off that med.  No ulcers in the mouth.    She usually takes per lower plate at night.  Her pain is better in the AM and increases at the day goes on.    Her son is in prison currently.  Discussed.  She is smoking, using symbicort, d/w pt about rinsing after symbicort use.    Meds, vitals, and allergies reviewed.   ROS: Per HPI unless specifically indicated in ROS section   Nad [place] Neck supple, no LA Mildly dry mouth w/o ulceration.  No intraoral lesions seen. Rrr

## 2022-10-15 LAB — SJOGREN'S SYNDROME ANTIBODS(SSA + SSB)
SSA (Ro) (ENA) Antibody, IgG: <1 AI
SSB (La) (ENA) Antibody, IgG: <1 AI

## 2022-10-16 ENCOUNTER — Encounter: Payer: Self-pay | Admitting: Family Medicine

## 2022-10-16 DIAGNOSIS — Z8639 Personal history of other endocrine, nutritional and metabolic disease: Secondary | ICD-10-CM | POA: Insufficient documentation

## 2022-10-16 DIAGNOSIS — E119 Type 2 diabetes mellitus without complications: Secondary | ICD-10-CM | POA: Insufficient documentation

## 2022-10-16 DIAGNOSIS — R682 Dry mouth, unspecified: Secondary | ICD-10-CM | POA: Insufficient documentation

## 2022-10-16 DIAGNOSIS — R5383 Other fatigue: Secondary | ICD-10-CM | POA: Insufficient documentation

## 2022-10-16 NOTE — Assessment & Plan Note (Signed)
Of unclear source.  Reasonable to look for reversible causes.  Reasonable to also check A1c given history of hyperglycemia.  See notes on labs.

## 2022-10-16 NOTE — Assessment & Plan Note (Signed)
Reasonable to try lidocaine viscous as needed and check Sjogren's labs.  See notes on labs.

## 2022-10-21 ENCOUNTER — Telehealth: Payer: Self-pay | Admitting: Family Medicine

## 2022-10-21 NOTE — Telephone Encounter (Signed)
Patient called in regarding her lab results that were discussed with her daughter about her being diabetic. She's requesting a phone call for these results to be explained to her a little better. She also stated that the lidocaine mouthwash is not working for her. She said that [employer] mouthwash works better for her.

## 2022-10-24 NOTE — Telephone Encounter (Signed)
Tried to call patient but no answer and VM was not set up yet

## 2022-10-26 NOTE — Telephone Encounter (Signed)
Tried to call patient back but no answer and VM is not set up

## 2022-11-03 ENCOUNTER — Telehealth: Payer: Self-pay | Admitting: Family Medicine

## 2022-11-03 NOTE — Telephone Encounter (Signed)
Apolonio Schneiders from [facility] called and stated they faxed over prescription request diabetic supply for strips and lancets. Call back number [phone].

## 2022-11-04 NOTE — Telephone Encounter (Signed)
Called and spoke with patients daughter and she stated that patient does not need supplies and to declined order. [facility] and tried to decline orders but they could not find patient in the system. If anyone from this company calls back please let them know we are declining services.

## 2022-11-16 NOTE — Telephone Encounter (Signed)
[facility] called back, notified them of message below orders being declined

## 2023-01-05 ENCOUNTER — Other Ambulatory Visit: Payer: Self-pay

## 2023-01-05 ENCOUNTER — Ambulatory Visit: Payer: 59 | Admitting: Gastroenterology

## 2023-01-05 NOTE — Progress Notes (Deleted)
Jonathon Bellows MD, MRCP(U.K) [address]  [place]  [place], [place] [address]  Main: [phone]  Fax: [phone]   Primary Care Physician: Tonia Ghent, MD  Primary Gastroenterologist:  Dr. Jonathon Bellows   No chief complaint on file.   HPI: Leslie Wong is a 67 y.o. female   Summary of history :   Initially referred and seen on 08/25/2022 for vomiting.She has previously seen in 2020 by [facility] gastroenterology for dyspepsia by Dr. Alice Reichert.  Colonoscopy in 2016 showed an adenomatous polyp.  EGD also performed at the same time showed esophagitis and gastritis. Has a history of hypothyroidism TSH in June 2023 was normal.  HbA1c of 6.3.  Presented to the ER on 30 2023 with recurrent vomiting apparently had been going on for several months and worsened over the past few weeks.  Did not tolerate Reglan in the past.  In May 2022 had a CT scan of the abdomen pelvis without contrast that showed features of pneumonia but otherwise no abnormality in the abdomen sigmoid diverticulosis was noted.  Labs in June 2023 hemoglobin 15 g CMP otherwise normal, borderline elevation of alkaline phosphatase lipase 42.    At her initial visit she had issues with vomiting ongoing for a few months is to be occasional then became more often.  Noted to have mold at her place where she used to live and when she changed locations all her cough resolved and vomiting episodes also resolved significantly.   Interval history   08/25/2022-01/05/2023   09/07/2022: EGD: No abnormality, colonoscopy no abnormality ***   Current Outpatient Medications  Medication Sig Dispense Refill   albuterol (VENTOLIN HFA) 108 (90 Base) MCG/ACT inhaler Inhale 2 puffs into the lungs every 6 (six) hours as needed for wheezing or shortness of breath. 18 g 0   amitriptyline (ELAVIL) 25 MG tablet Take 25 mg by mouth at bedtime as needed.     budesonide-formoterol (SYMBICORT) 80-4.5 MCG/ACT inhaler Bid 1 each 0    buprenorphine (BUTRANS) 15 MCG/HR 1 patch once a week.     buPROPion (WELLBUTRIN XL) 150 MG 24 hr tablet Take 150 mg by mouth every morning.     busPIRone (BUSPAR) 7.5 MG tablet Take 1 tablet by mouth daily.     Cholecalciferol 25 MCG (1000 UT) capsule Take 1,000 Units by mouth daily.     Diphenhyd-Hydrocort-Nystatin (FIRST-DUKES MOUTHWASH MT) 5 mLs in the morning, at noon, and at bedtime.     fluticasone (FLONASE) 50 MCG/ACT nasal spray Place 2 sprays into both nostrils daily. 16 g 6   hydrOXYzine (ATARAX/VISTARIL) 50 MG tablet Take 1 tablet (50 mg total) by mouth 3 (three) times daily as needed for anxiety. 60 tablet 0   ipratropium-albuterol (DUONEB) 0.5-2.5 (3) MG/3ML SOLN Take 3 mLs by nebulization every 4 (four) hours as needed. For wheezing and cough 360 mL 0   levothyroxine (SYNTHROID) 88 MCG tablet TAKE 1 TABLET(88 MCG) BY MOUTH DAILY AT 6 AM 90 tablet 3   lidocaine (XYLOCAINE) 2 % solution Use as directed 5 mLs in the mouth or throat every 6 (six) hours as needed for mouth pain (swish and spit). 100 mL 1   mirtazapine (REMERON) 30 MG tablet Take 30 mg by mouth at bedtime.     Multiple Vitamins-Minerals (MULTIVITAMIN ADULTS PO) Take by mouth.     naloxone (NARCAN) nasal spray 4 mg/0.1 mL Place 1 spray into the nose once.     OLANZapine (ZYPREXA) 15 MG  tablet Take by mouth.     Omega-3 Fatty Acids (FISH OIL) 1000 MG CAPS Take by mouth daily.     omeprazole (PRILOSEC) 40 MG capsule TAKE 1 CAPSULE(40 MG) BY MOUTH DAILY 90 capsule 3   ondansetron (ZOFRAN-ODT) 8 MG disintegrating tablet Take 8 mg by mouth 2 (two) times daily.     Oxybutynin Chloride (GELNIQUE) 10 % GEL Place onto the skin.     Oxycodone HCl 10 MG TABS Take 10 mg by mouth every 6 (six) hours as needed for pain.     Tiotropium Bromide-Olodaterol (STIOLTO RESPIMAT) 2.5-2.5 MCG/ACT AERS Inhale 2 puffs into the lungs daily. 1 each 11   tiZANidine (ZANAFLEX) 4 MG tablet Take by mouth.     valACYclovir (VALTREX) 500 MG tablet  Take 1 tablet (500 mg total) by mouth daily. 90 tablet 1   XTAMPZA ER 9 MG C12A Take 1 capsule by mouth at bedtime.     No current facility-administered medications for this visit.    Allergies as of 01/05/2023 - Review Complete 10/14/2022  Allergen Reaction Noted   Doxepin Other (See Comments) 12/11/2019   Valproic acid Shortness Of Breath 07/30/2015   Aspirin  06/05/2007   Atorvastatin Other (See Comments) 02/28/2019   Baclofen     Buprenorphine  04/26/2021   Duloxetine Other (See Comments) 09/30/2018   Erythromycin  06/05/2007   Gabapentin     Metoclopramide hcl  05/12/2009   Nortriptyline  11/13/2018   Nsaids Other (See Comments) 10/20/2011   Nucynta er [tapentadol hcl er]  01/11/2012   Nucynta [tapentadol]  07/24/2012   Prednisone  06/05/2007   Pregabalin  06/15/2010   Seroquel [quetiapine fumerate] Other (See Comments) 07/06/2011   Sulfa antibiotics Nausea And Vomiting 05/26/2011   Topamax Other (See Comments) 09/28/2011   Vicodin [hydrocodone-acetaminophen]  04/23/2013    ROS:  General: Negative for anorexia, weight loss, fever, chills, fatigue, weakness. ENT: Negative for hoarseness, difficulty swallowing , nasal congestion. CV: Negative for chest pain, angina, palpitations, dyspnea on exertion, peripheral edema.  Respiratory: Negative for dyspnea at rest, dyspnea on exertion, cough, sputum, wheezing.  GI: See history of present illness. GU:  Negative for dysuria, hematuria, urinary incontinence, urinary frequency, nocturnal urination.  Endo: Negative for unusual weight change.    Physical Examination:   There were no vitals taken for this visit.  General: Well-nourished, well-developed in no acute distress.  Eyes: No icterus. Conjunctivae pink. Mouth: Oropharyngeal mucosa moist and pink , no lesions erythema or exudate. Lungs: Clear to auscultation bilaterally. Non-labored. Heart: Regular rate and rhythm, no murmurs rubs or gallops.  Abdomen: Bowel sounds are  normal, nontender, nondistended, no hepatosplenomegaly or masses, no abdominal bruits or hernia , no rebound or guarding.   Extremities: No lower extremity edema. No clubbing or deformities. Neuro: Alert and oriented x 3.  Grossly intact. Skin: Warm and dry, no jaundice.   Psych: Alert and cooperative, normal mood and affect.   Imaging Studies: No results found.  Assessment and Plan:   Leslie Wong is a 67 y.o. y/o female with a history of  recurrent vomiting and colon cancer screening.  History of adenomatous polyps resected in 2016 due for colonoscopy.  History of recurrent vomiting very likely secondary to cough which may have been triggered from mold at her apartment.  Since changing locations the cough has resolved along with the vomiting.  Also possible that the vomiting caused significant esophagitis which has resolved with her omeprazole use.     Plan  1.  Continue omeprazole 40 mg once a day 90-day prescription with 3 refills will be provided at next visit we will reduce the dose if possible. 2.   HIDA scan if vomiting recurs   Dr Jonathon Bellows  MD,MRCP [facility]) Follow up in ***

## 2023-01-19 ENCOUNTER — Ambulatory Visit (INDEPENDENT_AMBULATORY_CARE_PROVIDER_SITE_OTHER): Payer: 59 | Admitting: Family Medicine

## 2023-01-19 ENCOUNTER — Encounter: Payer: Self-pay | Admitting: Family Medicine

## 2023-01-19 VITALS — BP 110/80 | HR 87 | Temp 98.1°F | Ht 59.0 in | Wt 137.0 lb

## 2023-01-19 DIAGNOSIS — Z8639 Personal history of other endocrine, nutritional and metabolic disease: Secondary | ICD-10-CM

## 2023-01-19 DIAGNOSIS — F319 Bipolar disorder, unspecified: Secondary | ICD-10-CM

## 2023-01-19 DIAGNOSIS — G8929 Other chronic pain: Secondary | ICD-10-CM

## 2023-01-19 DIAGNOSIS — R739 Hyperglycemia, unspecified: Secondary | ICD-10-CM | POA: Diagnosis not present

## 2023-01-19 LAB — POCT GLYCOSYLATED HEMOGLOBIN (HGB A1C): Hemoglobin A1C: 6.3 % — AB (ref 4.0–5.6)

## 2023-01-19 MED ORDER — LEVOTHYROXINE SODIUM 88 MCG PO TABS: 88.0000 ug | ORAL_TABLET | Freq: Every day | ORAL | 2 refills | Status: DC

## 2023-01-19 NOTE — Progress Notes (Signed)
A1c improved to 6.3.  No meds for DM2.  Prev A1c 7.  Labs d/w pt.  She cut back on back on carbs/white bread in the meantime.    She had burning in mouth that improved off amitriptyline.   She had dental implant placed, lower implant.  She has dental f/u pending.    She had recent eval re: prednisone for neck pain, per outside clinic.  On oxycodone/xtampza per outside clinic.  She has pain laying flat due to neck pain.  She can't get positioned in a regular bed.  D/w pt about options.  She was asking about getting an adjustable bed.  We talked about it but she deferred for now.  I asked her to update me as needed.  She is still seeing psychiatry, to est with new psychiatrist at same clinic in [place].  She is living back on her own at her previous apartment.  She got some help with groceries and utilities in the meantime.    No neck mass.  Compliant with levothyroxine.  TSh prev wnl, d/w pt.  Refill sent.    Meds, vitals, and allergies reviewed.   ROS: Per HPI unless specifically indicated in ROS section   GEN: nad, alert and oriented HEENT: ncat NECK: supple w/o LA CV: rrr. PULM: ctab, no inc wob ABD: soft, +bs EXT: no edema SKIN: Well-perfused

## 2023-01-19 NOTE — Patient Instructions (Addendum)
If you need help with an adjustable bed, then let me know.   At this point you are not diabetic but I would keep working on your diet in the meantime.  Recheck at a yearly visit in fall of 2024 (around September). Take care.  Glad to see you.

## 2023-01-22 NOTE — Assessment & Plan Note (Signed)
Per outside clinic.  I will defer.  She agrees to plan.

## 2023-01-22 NOTE — Assessment & Plan Note (Signed)
Per psychiatry.  See above.  She has follow-up pending.

## 2023-01-22 NOTE — Assessment & Plan Note (Signed)
History of diabetes based on A1c is 6.3.  She cut back on carbohydrates.  Continue with diet as is.

## 2023-03-16 ENCOUNTER — Other Ambulatory Visit: Payer: Self-pay | Admitting: Family

## 2023-03-16 DIAGNOSIS — Z1231 Encounter for screening mammogram for malignant neoplasm of breast: Secondary | ICD-10-CM

## 2023-03-28 ENCOUNTER — Ambulatory Visit
Admission: EM | Admit: 2023-03-28 | Discharge: 2023-03-28 | Disposition: A | Discharge status: Home / Self Care | Payer: 59 | Attending: Physician Assistant | Admitting: Physician Assistant

## 2023-03-28 ENCOUNTER — Ambulatory Visit (INDEPENDENT_AMBULATORY_CARE_PROVIDER_SITE_OTHER): Payer: 59

## 2023-03-28 DIAGNOSIS — Z1152 Encounter for screening for COVID-19: Secondary | ICD-10-CM | POA: Insufficient documentation

## 2023-03-28 DIAGNOSIS — R059 Cough, unspecified: Secondary | ICD-10-CM | POA: Diagnosis not present

## 2023-03-28 DIAGNOSIS — J439 Emphysema, unspecified: Secondary | ICD-10-CM

## 2023-03-28 DIAGNOSIS — R079 Chest pain, unspecified: Secondary | ICD-10-CM | POA: Diagnosis present

## 2023-03-28 DIAGNOSIS — F17210 Nicotine dependence, cigarettes, uncomplicated: Secondary | ICD-10-CM

## 2023-03-28 LAB — GLUCOSE, CAPILLARY: Glucose-Capillary: 109 mg/dL — ABNORMAL HIGH (ref 70–99)

## 2023-03-28 LAB — RESP PANEL BY RT-PCR (RSV, FLU A&B, COVID)  RVPGX2
Influenza A by PCR: NEGATIVE
Influenza B by PCR: NEGATIVE
Resp Syncytial Virus by PCR: NEGATIVE
SARS Coronavirus 2 by RT PCR: NEGATIVE

## 2023-03-28 MED ORDER — BUDESONIDE-FORMOTEROL FUMARATE 80-4.5 MCG/ACT IN AERO: INHALATION_SPRAY | RESPIRATORY_TRACT | 2 refills | Status: DC

## 2023-03-28 MED ORDER — DM-GUAIFENESIN ER 30-600 MG PO TB12: 1.0000 | ORAL_TABLET | Freq: Two times a day (BID) | ORAL | 1 refills | Status: DC | PRN

## 2023-03-28 MED ORDER — CEPHALEXIN 500 MG PO CAPS: 500.0000 mg | ORAL_CAPSULE | Freq: Two times a day (BID) | ORAL | 0 refills | Status: AC

## 2023-03-28 NOTE — ED Triage Notes (Signed)
Pt c/o cough, wheezing, rattling in chest onset x3 days ago. Pt states she has not been taking anything for sxs. Pt states she has had pneumonia x2 times prior.

## 2023-03-28 NOTE — Discharge Instructions (Addendum)
-  Chest x-ray was negative for any acute changes. -Mucinex DM: 1 tablet twice a day for cough and congestion -Refilled prescription for your Symbicort -Swab was negative for COVID, flu, and RSV, -Keflex: 1 tablet twice a day for 7 days -Follow-up with primary care pulmonology as needed.-

## 2023-03-28 NOTE — ED Provider Notes (Signed)
[facility]    CSN: [id number] Arrival date & time: 03/28/23  1343      History   Chief Complaint Chief Complaint  Patient presents with   Cough   Wheezing    HPI Leslie Wong is a 67 y.o. female.   Patient is a 67 year old female with past medical history that includes emphysema and current smoking who presents with chief complaint of respiratory symptoms that started Friday.  Patient reports wheezing, "water in her chest and lungs", cold sweats at night, and cough.  Patient reports smoking half pack per day x 40 years.  She reports some shortness of breath as well as some chest pain and sore throat related to her cough.  She states she does have a albuterol nebulizer but believes it is still packed up and boxes from her moved back in July.  Patient denies any sick contacts did states she got the flu shot this year.  Patient medication list also includes Symbicort and albuterol inhalers in addition to DuoNeb nebulizer.    Past Medical History:  Diagnosis Date   Back pain    Cancer    skin   COPD (chronic obstructive pulmonary disease)    Depression    BAD   Gastroparesis    GERD (gastroesophageal reflux disease)    HOH (hard of hearing)    Hypothyroidism    Insomnia    Migraines    Motion sickness    Parkinson disease    per [facility], dx'd 2019   Recurrent cold sores    Shoulder bursitis    Urge incontinence    Wears dentures    full upper and lower    Patient Active Problem List   Diagnosis Date Noted   Dry mouth 10/16/2022   Other fatigue 10/16/2022   History of diabetes mellitus 10/16/2022   Personal history of colonic polyps    Bronchiolitis 07/13/2022   Pulmonary emphysema 07/13/2022   Pulmonary nodules 07/13/2022   Seasonal allergic rhinitis 07/11/2022   Acute non-recurrent frontal sinusitis 07/11/2022   Irritation of oral cavity 05/29/2022   Smoking 06/16/2021   COPD (chronic obstructive pulmonary disease) 05/26/2021    Fall at home, initial encounter 05/25/2021   Pulmonary nodule 06/01/2020   Weight loss 05/04/2020   Bipolar 1 disorder 03/24/2020   Paranoia    Cannabis abuse 05/28/2019   Cracking skin 01/27/2019   Night sweats 05/13/2018   Pupil asymmetry 05/13/2018   DDD (degenerative disc disease), cervical 04/23/2018   Parkinson disease    HLD (hyperlipidemia) 03/10/2016   Advance care planning 03/10/2016   Cervical radiculopathy 07/30/2015   Osteopenia 04/18/2014   Medicare annual wellness visit, subsequent 03/18/2014   Cough 01/17/2013   Shoulder pain 01/17/2013   Chronic back pain 09/14/2011   Hypercalcemia 03/25/2010   Recurrent vomiting 05/12/2009   INCONTINENCE, URGE 04/16/2009   CARPAL TUNNEL SYNDROME, BILATERAL 09/24/2008   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   Hypothyroidism 06/05/2007   MIGRAINE HEADACHE 06/05/2007   Gastroparesis 06/05/2007   Gastroesophageal reflux disease with hiatal hernia 06/05/2007   Irritable bowel syndrome 06/05/2007   FIBROCYSTIC BREAST DISEASE 06/05/2007    Past Surgical History:  Procedure Laterality Date   ABDOMINAL HYSTERECTOMY  12/26/1997   total   APPENDECTOMY     CARPAL TUNNEL RELEASE Right    and tendon repair   CATARACT EXTRACTION W/PHACO Left 10/04/2021   Procedure: CATARACT EXTRACTION PHACO AND INTRAOCULAR LENS PLACEMENT (IOC) LEFT 9.46 00:48.0;  Surgeon: Eulogio Bear, MD;  Location: [facility];  Service: Ophthalmology;  Laterality: Left;  requests early   CATARACT EXTRACTION W/PHACO Right 11/08/2021   Procedure: CATARACT EXTRACTION PHACO AND INTRAOCULAR LENS PLACEMENT (IOC) RIGHT;  Surgeon: Eulogio Bear, MD;  Location: [facility];  Service: Ophthalmology;  Laterality: Right;  2.37 00:26.0   COLONOSCOPY WITH PROPOFOL N/A 09/07/2022   Procedure: COLONOSCOPY WITH PROPOFOL;  Surgeon: Jonathon Bellows, MD;  Location: [facility] ENDOSCOPY;  Service: Gastroenterology;  Laterality: N/A;   ESOPHAGOGASTRODUODENOSCOPY  01/26/2006    negative except small hiatal hernia   ESOPHAGOGASTRODUODENOSCOPY  02/24/2015   See report   ESOPHAGOGASTRODUODENOSCOPY N/A 09/07/2022   Procedure: ESOPHAGOGASTRODUODENOSCOPY (EGD);  Surgeon: Jonathon Bellows, MD;  Location: [facility] ENDOSCOPY;  Service: Gastroenterology;  Laterality: N/A;   LAPAROSCOPY     for endometriosis   OVARIAN CYST REMOVAL  12/26/1988   unilateral   TONSILLECTOMY AND ADENOIDECTOMY      OB History   No obstetric history on file.      Home Medications    Prior to Admission medications   Medication Sig Start Date End Date Taking? Authorizing Provider  cephALEXin (KEFLEX) 500 MG capsule Take 1 capsule (500 mg total) by mouth 2 (two) times daily for 7 days. 03/28/23 04/04/23 Yes Luvenia Redden, PA-C  dextromethorphan-guaiFENesin [facility] DM) 30-600 MG 12hr tablet Take 1 tablet by mouth 2 (two) times daily as needed for cough. 03/28/23 04/27/23 Yes Luvenia Redden, PA-C  albuterol (VENTOLIN HFA) 108 (90 Base) MCG/ACT inhaler Inhale 2 puffs into the lungs every 6 (six) hours as needed for wheezing or shortness of breath. 03/12/22   Rodriguez-Southworth, Sunday Spillers, PA-C  budesonide-formoterol (SYMBICORT) 80-4.5 MCG/ACT inhaler Bid 03/28/23   Luvenia Redden, PA-C  buprenorphine Haze Rushing) 15 MCG/HR 1 patch once a week. 08/25/22   [provider]  buPROPion (WELLBUTRIN XL) 150 MG 24 hr tablet Take 150 mg by mouth every morning. 11/19/21   [provider]  busPIRone (BUSPAR) 7.5 MG tablet Take 1 tablet by mouth daily. 08/17/22   [provider]  Cholecalciferol 25 MCG (1000 UT) capsule Take 1,000 Units by mouth daily.    [provider]  Diphenhyd-Hydrocort-Nystatin (FIRST-DUKES MOUTHWASH MT) 5 mLs in the morning, at noon, and at bedtime. 08/19/22   [provider]  fluticasone (FLONASE) 50 MCG/ACT nasal spray Place 2 sprays into both nostrils daily. 07/11/22   Eugenia Pancoast, FNP  hydrOXYzine (ATARAX/VISTARIL) 50 MG tablet Take 1 tablet (50 mg  total) by mouth 3 (three) times daily as needed for anxiety. 12/29/20   [name], [name], MD  ipratropium-albuterol (DUONEB) 0.5-2.5 (3) MG/3ML SOLN Take 3 mLs by nebulization every 4 (four) hours as needed. For wheezing and cough 03/12/22   Rodriguez-Southworth, Sunday Spillers, PA-C  levothyroxine (SYNTHROID) 88 MCG tablet Take 1 tablet (88 mcg total) by mouth daily before breakfast. 01/19/23   Tonia Ghent, MD  lidocaine (XYLOCAINE) 2 % solution Use as directed 5 mLs in the mouth or throat every 6 (six) hours as needed for mouth pain (swish and spit). 10/14/22   Tonia Ghent, MD  mirtazapine (REMERON) 30 MG tablet Take 30 mg by mouth at bedtime. 02/23/22   [provider]  Multiple Vitamins-Minerals (MULTIVITAMIN ADULTS PO) Take by mouth.    [provider]  naloxone [facility]) nasal spray 4 mg/0.1 mL Place 1 spray into the nose once. 12/09/22   [provider]  OLANZapine (ZYPREXA) 15 MG tablet Take by mouth. 05/10/21   [provider]  Omega-3 Fatty Acids (  FISH OIL) 1000 MG CAPS Take by mouth daily.    [provider]  omeprazole (PRILOSEC) 40 MG capsule TAKE 1 CAPSULE(40 MG) BY MOUTH DAILY 10/06/22   Tonia Ghent, MD  ondansetron (ZOFRAN-ODT) 8 MG disintegrating tablet Take 8 mg by mouth 2 (two) times daily. 11/24/21   [provider]  Oxybutynin Chloride (GELNIQUE) 10 % GEL Place onto the skin.    [provider]  Oxycodone HCl 10 MG TABS Take 10 mg by mouth every 6 (six) hours as needed for pain. 05/12/21   [provider]  Tiotropium Bromide-Olodaterol (STIOLTO RESPIMAT) 2.5-2.5 MCG/ACT AERS Inhale 2 puffs into the lungs daily. 08/03/22   Hunsucker, Bonna Gains, MD  tiZANidine (ZANAFLEX) 4 MG tablet Take by mouth. 06/04/21   [provider]  valACYclovir (VALTREX) 500 MG tablet Take 1 tablet (500 mg total) by mouth daily. 10/14/22   Tonia Ghent, MD  XTAMPZA ER 9 MG C12A Take 1 capsule by mouth at bedtime. 12/09/22    [provider]    Family History Family History  Problem Relation Age of Onset   Hypertension Mother    Arthritis Mother    Diabetes Mother    Stroke Mother    Cancer Father        lung   Colon cancer Neg Hx    Breast cancer Neg Hx     Social History Social History   Tobacco Use   Smoking status: Every Day    Packs/day: 0.25    Years: 42.50    Additional pack years: 0.00    Total pack years: 10.63    Types: Cigarettes, E-cigarettes   Smokeless tobacco: Former   Tobacco comments:    Pt states she smokes 1/2 ppd and she does vape. April Smith RN BSN 08/03/22  Vaping Use   Vaping Use: Some days  Substance Use Topics   Alcohol use: No    Alcohol/week: 0.0 standard drinks of alcohol   Drug use: No     Allergies   Doxepin, Valproic acid, Amitriptyline, Aspirin, Atorvastatin, Baclofen, Buprenorphine, Duloxetine, Erythromycin, Gabapentin, Metoclopramide hcl, Nortriptyline, Nsaids, Nucynta er [tapentadol hcl er], Nucynta [tapentadol], Prednisone, Pregabalin, Seroquel [quetiapine fumerate], Sulfa antibiotics, Topamax, and Vicodin [hydrocodone-acetaminophen]   Review of Systems Review of Systems as noted in HPI.  Other systems reviewed and found to be negative   Physical Exam Triage Vital Signs ED Triage Vitals  Enc Vitals Group     BP 03/28/23 1433 118/67     Pulse Rate 03/28/23 1433 93     Resp --      Temp 03/28/23 1433 98.3 F (36.8 C)     Temp Source 03/28/23 1433 Oral     SpO2 03/28/23 1433 94 %     Weight 03/28/23 1432 138 lb (62.6 kg)     Height 03/28/23 1432 4\' 11"  (1.499 m)     Head Circumference --      Peak Flow --      Pain Score 03/28/23 1432 8     Pain Loc --      Pain Edu? --      Excl. in [place]? --    No data found.  Updated Vital Signs BP 118/67 (BP Location: Right Arm)   Pulse 93   Temp 98.3 F (36.8 C) (Oral)   Ht 4\' 11"  (1.499 m)   Wt 138 lb (62.6 kg)   SpO2 94%   BMI 27.87 kg/m    Physical Exam Constitutional:  Appearance: Normal appearance.  HENT:     Head: Normocephalic and atraumatic.     Right Ear: Tympanic membrane normal.     Left Ear: Tympanic membrane normal.     Nose: Congestion present.     Mouth/Throat:     Mouth: Mucous membranes are moist.     Pharynx: Oropharynx is clear.  Cardiovascular:     Rate and Rhythm: Normal rate and regular rhythm.     Pulses: Normal pulses.     Heart sounds: Normal heart sounds.  Pulmonary:     Effort: Pulmonary effort is normal.     Breath sounds: Rhonchi present. No wheezing.  Musculoskeletal:     Cervical back: Normal range of motion.  Lymphadenopathy:     Cervical: No cervical adenopathy.  Skin:    Capillary Refill: Capillary refill takes less than 2 seconds.  Neurological:     General: No focal deficit present.     Mental Status: She is alert and oriented to person, place, and time.      UC Treatments / Results  Labs (all labs ordered are listed, but only abnormal results are displayed) Labs Reviewed  GLUCOSE, CAPILLARY - Abnormal; Notable for the following components:      Result Value   Glucose-Capillary 109 (*)    All other components within normal limits  RESP PANEL BY RT-PCR (RSV, FLU A&B, COVID)  RVPGX2  CBG MONITORING, ED    EKG   Radiology DG Chest 2 View  Result Date: 03/28/2023 CLINICAL DATA:  cough, wheezing EXAM: CHEST - 2 VIEW COMPARISON:  03/12/2022 FINDINGS: Cardiac silhouette is unremarkable. No pneumothorax or pleural effusion. The lungs are clear. The visualized skeletal structures are unremarkable. IMPRESSION: No acute cardiopulmonary process. Electronically Signed   By: Sammie Bench M.D.   On: 03/28/2023 14:58    Procedures Procedures (including critical care time)  Medications Ordered in UC Medications - No data to display  Initial Impression / Assessment and Plan / UC Course  I have reviewed the triage vital signs and the nursing notes.  Pertinent labs & imaging results that were available during  my care of the patient were reviewed by me and considered in my medical decision making (see chart for details).    Patient is a 67 year old female with past medical history of emphysema and smoking who presents with respiratory symptoms ongoing since Friday.  Patient reports some wheezing as well as some chest congestion.  Patient also reporting some cold sweats at night.  Patient states she took a COVID test at home that was negative.  She has not gotten a flu shot.  Patient denies any sick contacts.  Patient with rhonchorous respirations without need for auscultation.  No wheezing noted.  Patient reports some chest pain and sore throat related to her cough.  Patient also reports some shortness of breath.  Patient states her nebulizer machine is packed up in a box still from her move this past July.  Patient also has prescription for Symbicort and albuterol inhalers.  Will check a chest x-ray.  Saturation 94% on room air.  Patient also requesting blood sugar check  Chest x-ray with no acute changes or acute pulmonary disease.  Given prescription for Mucinex DM as well as a prescription for Keflex given her history of smoking emphysema.  Refilled her Symbicort.  Have her follow-up with primary care or pulmonology as needed.  COVID, flu, and RSV PCR's were negative as well.   Final Clinical Impressions(s) / UC Diagnoses  Final diagnoses:  Cough, unspecified type  Pulmonary emphysema, unspecified emphysema type  Smoking greater than 20 pack years     Discharge Instructions      -Chest x-ray was negative for any acute changes. -Mucinex DM: 1 tablet twice a day for cough and congestion -Refilled prescription for your Symbicort     ED Prescriptions     Medication Sig Dispense Auth. Provider   budesonide-formoterol (SYMBICORT) 80-4.5 MCG/ACT inhaler Bid 1 each Luvenia Redden, PA-C   dextromethorphan-guaiFENesin (MUCINEX DM) 30-600 MG 12hr tablet Take 1 tablet by mouth 2 (two) times  daily as needed for cough. 60 tablet Luvenia Redden, PA-C   cephALEXin (KEFLEX) 500 MG capsule Take 1 capsule (500 mg total) by mouth 2 (two) times daily for 7 days. 14 capsule Luvenia Redden, PA-C      PDMP not reviewed this encounter.   Luvenia Redden, PA-C 03/28/23 1550

## 2023-04-04 ENCOUNTER — Ambulatory Visit (INDEPENDENT_AMBULATORY_CARE_PROVIDER_SITE_OTHER)
Admission: RE | Admit: 2023-04-04 | Discharge: 2023-04-04 | Disposition: A | Discharge status: Home / Self Care | Payer: 59 | Source: Ambulatory Visit | Attending: Nurse Practitioner | Admitting: Nurse Practitioner

## 2023-04-04 ENCOUNTER — Ambulatory Visit (INDEPENDENT_AMBULATORY_CARE_PROVIDER_SITE_OTHER): Payer: 59 | Admitting: Nurse Practitioner

## 2023-04-04 ENCOUNTER — Encounter: Payer: Self-pay | Admitting: Nurse Practitioner

## 2023-04-04 VITALS — BP 124/76 | HR 99 | Temp 97.8°F | Resp 16 | Ht 59.0 in | Wt 144.5 lb

## 2023-04-04 DIAGNOSIS — J0100 Acute maxillary sinusitis, unspecified: Secondary | ICD-10-CM

## 2023-04-04 DIAGNOSIS — R062 Wheezing: Secondary | ICD-10-CM

## 2023-04-04 DIAGNOSIS — R0689 Other abnormalities of breathing: Secondary | ICD-10-CM

## 2023-04-04 DIAGNOSIS — R051 Acute cough: Secondary | ICD-10-CM

## 2023-04-04 DIAGNOSIS — J441 Chronic obstructive pulmonary disease with (acute) exacerbation: Secondary | ICD-10-CM | POA: Diagnosis not present

## 2023-04-04 MED ORDER — PREDNISONE 20 MG PO TABS
40.0000 mg | ORAL_TABLET | Freq: Every day | ORAL | 0 refills | Status: AC
Start: 2023-04-04 — End: 2023-04-09

## 2023-04-04 MED ORDER — GUAIFENESIN-CODEINE 100-10 MG/5ML PO SOLN
5.0000 mL | Freq: Three times a day (TID) | ORAL | 0 refills | Status: DC | PRN
Start: 2023-04-04 — End: 2023-04-13

## 2023-04-04 MED ORDER — AMOXICILLIN-POT CLAVULANATE 875-125 MG PO TABS
1.0000 | ORAL_TABLET | Freq: Two times a day (BID) | ORAL | 0 refills | Status: AC
Start: 2023-04-04 — End: 2023-04-11

## 2023-04-04 NOTE — Assessment & Plan Note (Signed)
Prednisone 40 mg as directed.  Steroid precautions reviewed.  Patient does have an allergy listed but she states that because the lining of her stomach to come out."  Patient to take with food avoid NSAIDs

## 2023-04-04 NOTE — Assessment & Plan Note (Signed)
Will repeat chest x-ray today when urgent care came back normal

## 2023-04-04 NOTE — Patient Instructions (Signed)
Nice to see you today I have sent in cough medication. Antibiotics, and steroids Follow up if you are not improve  I will be in touch with the xray once I have reviewed it  Schedule with Dr. Para March if you want to discuss weight loss

## 2023-04-04 NOTE — Assessment & Plan Note (Signed)
Will treat with Augmentin 875-125 mg twice daily for 7 days. 

## 2023-04-04 NOTE — Assessment & Plan Note (Signed)
Pending chest x-ray codeine guaifenesin cough medication sent in.  5 mL 3 times daily as needed cough

## 2023-04-04 NOTE — Progress Notes (Signed)
Acute Office Visit  Subjective:     Patient ID: Leslie Wong, female    DOB: 06/12/1956, 67 y.o.   MRN: [id number]  Chief Complaint  Patient presents with   Cough    Feels like that there is water in her chest.     Patient is in today for cough  Patient was seen in urgent care on 03/28/2023.  They did a respiratory viral panel that came back negative chest x-ray was for no acute changes.  They placed patient on Robitussin DM and Keflex 500 mg twice daily.  States that she has not improved at all. States that it sounds like "water is in her lungs".   States that she did take the keflex that was prescriebed and has been using the sammbacol over-the-counter lozenges.  Lozenges seem to help but she states she did not have good response to the antibiotic patient also requesting Tussionex cough medication    Review of Systems  Constitutional:  Negative for chills and fever.       Appetite is normal  Fluid intake is good   HENT:  Positive for ear pain (full). Negative for ear discharge, sinus pain and sore throat.   Respiratory:  Positive for cough and shortness of breath. Negative for sputum production.   Musculoskeletal:  Positive for joint pain.  Neurological:  Negative for headaches.        Objective:    BP 124/76   Pulse 99   Temp 97.8 F (36.6 C)   Resp 16   Ht 4\' 11"  (1.499 m)   Wt 144 lb 8 oz (65.5 kg)   SpO2 97%   BMI 29.19 kg/m  BP Readings from Last 3 Encounters:  04/04/23 124/76  03/28/23 118/67  01/19/23 110/80   Wt Readings from Last 3 Encounters:  04/04/23 144 lb 8 oz (65.5 kg)  03/28/23 138 lb (62.6 kg)  01/19/23 137 lb (62.1 kg)      Physical Exam Vitals and nursing note reviewed.  Constitutional:      Appearance: Normal appearance.  HENT:     Right Ear: Tympanic membrane, ear canal and external ear normal.     Left Ear: Tympanic membrane, ear canal and external ear normal.     Nose:     Right Sinus: Maxillary sinus tenderness  present. No frontal sinus tenderness.     Left Sinus: Maxillary sinus tenderness present. No frontal sinus tenderness.     Mouth/Throat:     Mouth: Mucous membranes are dry.     Pharynx: Oropharynx is clear.  Cardiovascular:     Rate and Rhythm: Normal rate and regular rhythm.     Heart sounds: Normal heart sounds.  Pulmonary:     Effort: Pulmonary effort is normal.     Breath sounds: Wheezing present.     Comments: Decreased in bilateral lower lobes Lymphadenopathy:     Cervical: No cervical adenopathy.  Neurological:     Mental Status: She is alert.     No results found for any visits on 04/04/23.      Assessment & Plan:   Problem List Items Addressed This Visit       Respiratory   Acute non-recurrent maxillary sinusitis    Will treat with Augmentin 875-125 mg twice daily for 7 days.      Relevant Medications   amoxicillin-clavulanate (AUGMENTIN) 875-125 MG tablet   predniSONE (DELTASONE) 20 MG tablet   guaiFENesin-codeine 100-10 MG/5ML syrup   COPD exacerbation -  Primary    Will treat for COPD exacerbation.  Will do prednisone 40 mg for 5 days and Augmentin antibiotic to cover her sinusitis also.  Patient has inhalers at home if she needs them will follow-up with improvement also repeat chest x-ray today      Relevant Medications   amoxicillin-clavulanate (AUGMENTIN) 875-125 MG tablet   predniSONE (DELTASONE) 20 MG tablet   guaiFENesin-codeine 100-10 MG/5ML syrup     Other   Cough    Pending chest x-ray codeine guaifenesin cough medication sent in.  5 mL 3 times daily as needed cough      Relevant Medications   guaiFENesin-codeine 100-10 MG/5ML syrup   Other Relevant Orders   DG Chest 2 View   Wheezing    Prednisone 40 mg as directed.  Steroid precautions reviewed.  Patient does have an allergy listed but she states that because the lining of her stomach to come out."  Patient to take with food avoid NSAIDs      Relevant Medications   predniSONE  (DELTASONE) 20 MG tablet   Other Relevant Orders   DG Chest 2 View   Adventitious breath sounds    Will repeat chest x-ray today when urgent care came back normal      Relevant Orders   DG Chest 2 View    Meds ordered this encounter  Medications   amoxicillin-clavulanate (AUGMENTIN) 875-125 MG tablet    Sig: Take 1 tablet by mouth 2 (two) times daily for 7 days.    Dispense:  14 tablet    Refill:  0    Order Specific Question:   Supervising Provider    Answer:   Milinda Antis, MARNE A [[id number]]   predniSONE (DELTASONE) 20 MG tablet    Sig: Take 2 tablets (40 mg total) by mouth daily with breakfast for 5 days. Avoid NSAIDs like ibuprofen, aleve, naproxen, BC/goody powders    Dispense:  10 tablet    Refill:  0    Order Specific Question:   Supervising Provider    Answer:   TOWER, MARNE A [[id number]]   guaiFENesin-codeine 100-10 MG/5ML syrup    Sig: Take 5 mLs by mouth 3 (three) times daily as needed for cough.    Dispense:  120 mL    Refill:  0    Order Specific Question:   Supervising Provider    Answer:   Roxy Manns A [[id number]]    Return if symptoms worsen or fail to improve.  Audria Nine, NP

## 2023-04-04 NOTE — Assessment & Plan Note (Signed)
Will treat for COPD exacerbation.  Will do prednisone 40 mg for 5 days and Augmentin antibiotic to cover her sinusitis also.  Patient has inhalers at home if she needs them will follow-up with improvement also repeat chest x-ray today

## 2023-04-05 ENCOUNTER — Other Ambulatory Visit: Payer: Self-pay | Admitting: Nurse Practitioner

## 2023-04-05 DIAGNOSIS — J189 Pneumonia, unspecified organism: Secondary | ICD-10-CM

## 2023-04-05 MED ORDER — DOXYCYCLINE HYCLATE 100 MG PO TABS
100.0000 mg | ORAL_TABLET | Freq: Two times a day (BID) | ORAL | 0 refills | Status: AC
Start: 2023-04-05 — End: 2023-04-12

## 2023-04-11 ENCOUNTER — Telehealth: Payer: Self-pay | Admitting: Family Medicine

## 2023-04-11 NOTE — Telephone Encounter (Signed)
Patient contacted the office regarding her most recent visit, states she is not feeling any better. Still has a cough and chest congestion, not able to bring up any mucus. Would like a call regarding this and some idea of what to do next. Please advise [phone].

## 2023-04-11 NOTE — Telephone Encounter (Signed)
Unable to reach patient by telephone. Tried to call patient's telephone number numerous times and the call would not connect. Called and advised patient's daughter (on [place]) of Dr. Lianne Bushy message to patient. Patient's daughter stated that she will contact her mom with this information.

## 2023-04-11 NOTE — Telephone Encounter (Signed)
Make sure she is taking extra fluid with mucinex and using albuterol regularly in addition to symbicort.  If that isn't helping, then I want her to get rechecked. Thanks.

## 2023-04-13 ENCOUNTER — Telehealth: Payer: Self-pay | Admitting: Family Medicine

## 2023-04-13 ENCOUNTER — Ambulatory Visit: Payer: 59 | Admitting: Family Medicine

## 2023-04-13 ENCOUNTER — Ambulatory Visit (INDEPENDENT_AMBULATORY_CARE_PROVIDER_SITE_OTHER): Payer: 59 | Admitting: Family Medicine

## 2023-04-13 ENCOUNTER — Encounter: Payer: Self-pay | Admitting: Family Medicine

## 2023-04-13 VITALS — BP 126/80 | HR 108 | Temp 97.5°F | Ht 59.0 in | Wt 142.5 lb

## 2023-04-13 DIAGNOSIS — J441 Chronic obstructive pulmonary disease with (acute) exacerbation: Secondary | ICD-10-CM

## 2023-04-13 DIAGNOSIS — J439 Emphysema, unspecified: Secondary | ICD-10-CM

## 2023-04-13 DIAGNOSIS — J189 Pneumonia, unspecified organism: Secondary | ICD-10-CM | POA: Insufficient documentation

## 2023-04-13 DIAGNOSIS — F17200 Nicotine dependence, unspecified, uncomplicated: Secondary | ICD-10-CM

## 2023-04-13 DIAGNOSIS — J0100 Acute maxillary sinusitis, unspecified: Secondary | ICD-10-CM

## 2023-04-13 MED ORDER — PREDNISONE 20 MG PO TABS
40.0000 mg | ORAL_TABLET | Freq: Every day | ORAL | 0 refills | Status: DC
Start: 2023-04-13 — End: 2023-04-18

## 2023-04-13 MED ORDER — PREDNISONE 20 MG PO TABS
40.0000 mg | ORAL_TABLET | Freq: Every day | ORAL | 0 refills | Status: DC
Start: 2023-04-13 — End: 2023-04-13

## 2023-04-13 NOTE — Telephone Encounter (Signed)
Pt called asking [facility] if something can some other cough meds be sent in along with mouth wash for her coughing issues? Pt states Para March usually sends in some type of mouth wash for her issues. Pt states [facility] prescribed the meds, predniSONE (DELTASONE) 20 MG tablet on today, 4/18, during ov. Para March do you know what meds & mouth wash the pt could be referring to?

## 2023-04-13 NOTE — Assessment & Plan Note (Signed)
Saw pulmonary-Dr Hunsucker in [place] in aug Rev this Was supposed to swap symbicort for stiolto respimat 2 puff daily  Pt was confused about this Has refills on it but she changed pharmacies-needs to have it transferred  I went ahead and ref to pulmonary to get a follow up Still smoking and vaping unfortunately  Disc imp of cessation

## 2023-04-13 NOTE — Assessment & Plan Note (Signed)
Clinically improved  Originally tx with augmentin

## 2023-04-13 NOTE — Progress Notes (Signed)
Subjective:    Patient ID: Leslie Wong, female    DOB: 1956/08/12, 67 y.o.   MRN: [id number]  HPI 67 yo pt of Dr Para March presents for f/u of pneumonia with copd exacerbation  She has a h/o pulmonary emphysema and polmonary nodules  Also bipolar   Wt Readings from Last 3 Encounters:  04/13/23 142 lb 8 oz (64.6 kg)  04/04/23 144 lb 8 oz (65.5 kg)  03/28/23 138 lb (62.6 kg)   28.78 kg/m  Vitals:   04/13/23 1116  BP: 126/80  Pulse: (!) 108  Temp: (!) 97.5 F (36.4 C)  SpO2: 95%    Saw NP Cable on 4/9 Noted urgent care visis on 4/2 with neg resp panel and  neg cxr= they put her on keflex and robitussin DM Did not improve  He then diagnosed her with augmentin and prednisone and guiafen-codine for sinusitis with copd exacerbation   Cxr re done DG Chest 2 View  Result Date: 04/05/2023 CLINICAL DATA:  67 year old female with a history of wheezing/shob/ adventitious breath sounds EXAM: CHEST - 2 VIEW COMPARISON:  03/28/2023 FINDINGS: Cardiomediastinal silhouette unchanged in size and contour. New reticulonodular opacities the bilateral lungs. No pneumothorax or pleural effusion. No central vascular congestion. No interlobular septal thickening. No pneumothorax or pleural effusion. Degenerative changes spine.  No displaced fracture IMPRESSION: New reticulonodular opacities of the lungs, leading differential diagnosis would be atypical infection Electronically Signed   By: Gilmer Mor D.O.   On: 04/05/2023 11:46   DG Chest 2 View  Result Date: 03/28/2023 CLINICAL DATA:  cough, wheezing EXAM: CHEST - 2 VIEW COMPARISON:  03/12/2022 FINDINGS: Cardiac silhouette is unremarkable. No pneumothorax or pleural effusion. The lungs are clear. The visualized skeletal structures are unremarkable. IMPRESSION: No acute cardiopulmonary process. Electronically Signed   By: Layla Maw M.D.   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   Today :  Still feels like phlegm in chest  Also hoarse voice  Cannot get a lot up  Drinking fluids and taking mucinex  Wheezes all the time  (the prednisone helped a bit prior)  Cannot tolerate high does of it   She is running out of her symb   Was interested in chantix -wants to discuss with Dr Para March   Patient Active Problem List   Diagnosis Date Noted   Atypical pneumonia 04/13/2023   Wheezing 04/04/2023   Adventitious breath sounds 04/04/2023   Dry mouth 10/16/2022   Other fatigue 10/16/2022   History of diabetes mellitus 10/16/2022   Personal history of colonic polyps    Pulmonary emphysema 07/13/2022   Pulmonary nodules 07/13/2022   Seasonal allergic rhinitis 07/11/2022   Irritation of oral cavity 05/29/2022   Smoking 06/16/2021   COPD exacerbation 05/26/2021   Fall at home, initial encounter 05/25/2021   Pulmonary nodule 06/01/2020   Weight loss 05/04/2020   Bipolar 1 disorder 03/24/2020   Paranoia    Cannabis abuse 05/28/2019   Cracking skin  01/27/2019   Night sweats 05/13/2018   Pupil asymmetry 05/13/2018   DDD (degenerative disc disease), cervical 04/23/2018   Acute non-recurrent maxillary sinusitis 03/14/2018   Parkinson disease    HLD (hyperlipidemia) 03/10/2016   Advance care planning 03/10/2016   Cervical radiculopathy 07/30/2015   Osteopenia 04/18/2014   Medicare annual wellness visit, subsequent 03/18/2014   Cough 01/17/2013   Shoulder pain 01/17/2013   Chronic back pain 09/14/2011   Hypercalcemia 03/25/2010   Recurrent vomiting 05/12/2009   INCONTINENCE, URGE 04/16/2009   CARPAL  TUNNEL SYNDROME, BILATERAL 09/24/2008   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   Hypothyroidism 06/05/2007   MIGRAINE HEADACHE 06/05/2007   Gastroparesis 06/05/2007   Gastroesophageal reflux disease with hiatal hernia 06/05/2007   Irritable bowel syndrome 06/05/2007   FIBROCYSTIC BREAST DISEASE 06/05/2007   Past Medical History:  Diagnosis Date   Back pain    Cancer    skin   COPD (chronic obstructive pulmonary disease)    Depression    BAD   Gastroparesis    GERD (gastroesophageal reflux disease)    HOH (hard of hearing)    Hypothyroidism    Insomnia    Migraines    Motion sickness    Parkinson disease    per [facility], dx'd 2019   Recurrent cold sores    Shoulder bursitis    Urge incontinence    Wears dentures    full upper and lower   Past Surgical History:  Procedure Laterality Date   ABDOMINAL HYSTERECTOMY  12/26/1997   total   APPENDECTOMY     CARPAL TUNNEL RELEASE Right    and tendon repair   CATARACT EXTRACTION W/PHACO Left 10/04/2021   Procedure: CATARACT EXTRACTION PHACO AND INTRAOCULAR LENS PLACEMENT (IOC) LEFT 9.46 00:48.0;  Surgeon: Nevada Crane, MD;  Location: [facility];  Service: Ophthalmology;  Laterality: Left;  requests early   CATARACT EXTRACTION W/PHACO Right 11/08/2021   Procedure: CATARACT EXTRACTION PHACO AND INTRAOCULAR LENS PLACEMENT (IOC) RIGHT;  Surgeon: Nevada Crane, MD;  Location: [facility];  Service: Ophthalmology;  Laterality: Right;  2.37 00:26.0   COLONOSCOPY WITH PROPOFOL N/A 09/07/2022   Procedure: COLONOSCOPY WITH PROPOFOL;  Surgeon: Wyline Mood, MD;  Location: [facility] END[facility];  Service: Gastroenterology;  Laterality: N/A;   ESOPHAGOGASTRODUODENOSCOPY  01/26/2006   negative except small hiatal hernia   ESOPHAGOGASTRODUODENOSCOPY  02/24/2015   See report   ESOPHAGOGASTRODUODENOSCOPY N/A 09/07/2022   Procedure: ESOPHAGOGASTRODUODENOSCOPY (EGD);  Surgeon: Wyline Mood, MD;  Location: [facility] ENDOSCOPY;  Service:  Gastroenterology;  Laterality: N/A;   LAPAROSCOPY     for endometriosis   OVARIAN CYST REMOVAL  12/26/1988   unilateral   TONSILLECTOMY AND ADENOIDECTOMY     Social History   Tobacco Use   Smoking status: Every Day    Packs/day: 0.25    Years: 42.50    Additional pack years: 0.00    Total pack years: 10.63    Types: Cigarettes, E-cigarettes   Smokeless tobacco: Former   Tobacco comments:    Pt states she smokes 1/2 ppd and she does vape. April Smith RN BSN 08/03/22  Vaping Use   Vaping Use: Some days  Substance Use Topics   Alcohol use: No    Alcohol/week: 0.0 standard drinks of alcohol   Drug use: No   Family History  Problem Relation Age of Onset   Hypertension Mother    Arthritis Mother    Diabetes Mother    Stroke Mother    Cancer Father  lung   Colon cancer Neg Hx    Breast cancer Neg Hx    Allergies  Allergen Reactions   Doxepin Other (See Comments)    Pt reports "that made me have seizures"   Valproic Acid Shortness Of Breath   Amitriptyline     Burning sensation in mouth   Aspirin     REACTION: UNSPECIFIED   Atorvastatin Other (See Comments)    Tongue swelling, legs cramping   Baclofen     REACTION: Throat swells up   Buprenorphine     Vomiting, intolerant   Duloxetine Other (See Comments)    Vision loss, weight loss   Erythromycin     REACTION: UNSPECIFIED   Gabapentin     REACTION: throat swells up   Metoclopramide Hcl     REACTION: states "messed me up"   Nortriptyline     Other reaction(s): Other (See Comments) Vision issues   Nsaids Other (See Comments)    Stomach problems   Nucynta Er [Tapentadol Hcl Er]     Intolerant, see note from 07/24/12.     Nucynta [Tapentadol]     AMS   Prednisone     GI intolerance with higher dose.  Can tolerate 5mg  per day.     Pregabalin    Seroquel [Quetiapine Fumerate] Other (See Comments)    Loss control, felt like on another planet   Sulfa Antibiotics Nausea And Vomiting   Topamax Other (See  Comments)    Blisters in mouth and tongue   Vicodin [Hydrocodone-Acetaminophen]     Nausea, thrush and constipation   Current Outpatient Medications on File Prior to Visit  Medication Sig Dispense Refill   albuterol (VENTOLIN HFA) 108 (90 Base) MCG/ACT inhaler Inhale 2 puffs into the lungs every 6 (six) hours as needed for wheezing or shortness of breath. 18 g 0   budesonide-formoterol (SYMBICORT) 80-4.5 MCG/ACT inhaler Bid 1 each 2   buprenorphine (BUTRANS) 15 MCG/HR 1 patch once a week.     buPROPion (WELLBUTRIN XL) 150 MG 24 hr tablet Take 150 mg by mouth every morning.     busPIRone (BUSPAR) 7.5 MG tablet Take 1 tablet by mouth daily.     Cholecalciferol 25 MCG (1000 UT) capsule Take 1,000 Units by mouth daily.     Diphenhyd-Hydrocort-Nystatin (FIRST-DUKES MOUTHWASH MT) 5 mLs in the morning, at noon, and at bedtime.     fluticasone (FLONASE) 50 MCG/ACT nasal spray Place 2 sprays into both nostrils daily. 16 g 6   hydrOXYzine (ATARAX/VISTARIL) 50 MG tablet Take 1 tablet (50 mg total) by mouth 3 (three) times daily as needed for anxiety. 60 tablet 0   ipratropium-albuterol (DUONEB) 0.5-2.5 (3) MG/3ML SOLN Take 3 mLs by nebulization every 4 (four) hours as needed. For wheezing and cough 360 mL 0   levothyroxine (SYNTHROID) 88 MCG tablet Take 1 tablet (88 mcg total) by mouth daily before breakfast. 90 tablet 2   lidocaine (XYLOCAINE) 2 % solution Use as directed 5 mLs in the mouth or throat every 6 (six) hours as needed for mouth pain (swish and spit). 100 mL 1   mirtazapine (REMERON) 30 MG tablet Take 30 mg by mouth at bedtime.     Multiple Vitamins-Minerals (MULTIVITAMIN ADULTS PO) Take by mouth.     naloxone (NARCAN) nasal spray 4 mg/0.1 mL Place 1 spray into the nose once.     OLANZapine (ZYPREXA) 15 MG tablet Take by mouth.     Omega-3 Fatty Acids (FISH OIL) 1000 MG CAPS  Take by mouth daily.     omeprazole (PRILOSEC) 40 MG capsule TAKE 1 CAPSULE(40 MG) BY MOUTH DAILY 90 capsule 3    ondansetron (ZOFRAN-ODT) 8 MG disintegrating tablet Take 8 mg by mouth 2 (two) times daily.     Oxybutynin Chloride (GELNIQUE) 10 % GEL Place onto the skin.     Oxycodone HCl 10 MG TABS Take 10 mg by mouth every 6 (six) hours as needed for pain.     Tiotropium Bromide-Olodaterol (STIOLTO RESPIMAT) 2.5-2.5 MCG/ACT AERS Inhale 2 puffs into the lungs daily. 1 each 11   tiZANidine (ZANAFLEX) 4 MG tablet Take by mouth.     valACYclovir (VALTREX) 500 MG tablet Take 1 tablet (500 mg total) by mouth daily. 90 tablet 1   XTAMPZA ER 9 MG C12A Take 1 capsule by mouth at bedtime.     No current facility-administered medications on file prior to visit.    Review of Systems  Constitutional:  Positive for appetite change and fatigue. Negative for fever.  HENT:  Positive for congestion, postnasal drip, rhinorrhea, sinus pressure, sneezing and sore throat. Negative for ear pain and sinus pain.        Nasal symptoms are much improved   Eyes:  Negative for pain and discharge.  Respiratory:  Positive for cough, chest tightness, shortness of breath and wheezing. Negative for apnea, choking and stridor.   Cardiovascular:  Negative for chest pain.  Gastrointestinal:  Negative for diarrhea, nausea and vomiting.  Genitourinary:  Negative for frequency, hematuria and urgency.  Musculoskeletal:  Negative for arthralgias and myalgias.  Skin:  Negative for rash.  Neurological:  Negative for dizziness, weakness, light-headedness and headaches.  Psychiatric/Behavioral:  Negative for confusion and dysphoric mood.        Objective:   Physical Exam Constitutional:      General: She is not in acute distress.    Appearance: Normal appearance. She is normal weight. She is not ill-appearing or diaphoretic.  HENT:     Head: Normocephalic and atraumatic.     Right Ear: Tympanic membrane and ear canal normal.     Left Ear: Tympanic membrane and ear canal normal.     Nose: Rhinorrhea present.     Mouth/Throat:     Mouth:  Mucous membranes are moist.     Pharynx: Oropharynx is clear. No posterior oropharyngeal erythema.     Comments: Hoarse voice  Eyes:     General:        Right eye: No discharge.        Left eye: No discharge.     Conjunctiva/sclera: Conjunctivae normal.     Pupils: Pupils are equal, round, and reactive to light.  Cardiovascular:     Rate and Rhythm: Tachycardia present.     Heart sounds: Normal heart sounds.  Pulmonary:     Effort: Pulmonary effort is normal.     Breath sounds: No stridor. Wheezing and rhonchi present. No rales.     Comments: Scattered rhonci   Wheezing is worse in bases Mildly prolonged exp phase No rales   Cough sounds junky   Chest:     Chest wall: No tenderness.  Musculoskeletal:     Cervical back: Neck supple.  Lymphadenopathy:     Cervical: No cervical adenopathy.  Skin:    General: Skin is warm and dry.     Findings: No rash.  Neurological:     Mental Status: She is alert.     Cranial Nerves: No cranial nerve deficit.  Psychiatric:        Speech: Speech is tangential.     Comments: Some difficulty communicating due to hearing impairment Pleasant Tangential in speech           Assessment & Plan:   Problem List Items Addressed This Visit       Respiratory   Acute non-recurrent maxillary sinusitis    Clinically improved  Originally tx with augmentin        Relevant Medications   predniSONE (DELTASONE) 20 MG tablet   Atypical pneumonia - Primary    Noted on cxr 04/05/23 with reticulonodular opacities   Cough is junky-pt cannot mobilize the mucous well  Wheezing did improve initially with prednisone and worsen again (she was able to tolerate 40 mg)  Today will px prednisone 40 mg daily for a week (rev side eff, take with food)  Reviewed recent records and studies Needs f/u with pcp next wk for re check  Rev ER precautions in detail  Enc her to use her home inhalers  Update if not starting to improve in a week or if worsening         COPD exacerbation    With atypical pneumonia  Reviewed UC notes from 4/2 Also notes from NP Cable on 4/9  Also pulmonary notes from Dr Judeth Horn from August 2023  She is unclear about what medicines she should be on  [facility]eds to [facility] her [facility] and have the stiolto respimat inhaler transferred to her current pharmacy   Today - continues to wheeze Px prednisone 40 mg (she tolerated this earlier this month ) daily for 7 d F/u with pcp next week  Ref to pulmonary- overdue for follow up there  Long conversation about smoking cessation-she plans to d/w Dr Para March as well        Relevant Medications   predniSONE (DELTASONE) 20 MG tablet   Other Relevant Orders   Ambulatory referral to Pulmonology   Pulmonary emphysema    Saw pulmonary-Dr Hunsucker in [place] in aug Rev this Was supposed to swap symbicort for stiolto respimat 2 puff daily  Pt was confused about this Has refills on it but she changed pharmacies-needs to have it transferred  I went ahead and ref to pulmonary to get a follow up Still smoking and vaping unfortunately  Disc imp of cessation        Relevant Medications   predniSONE (DELTASONE) 20 MG tablet   Other Relevant Orders   Ambulatory referral to Pulmonology     Other   Smoking    Smokes 1/2 ppd Vape- 10 draws per day  Struggling with copd exacerbation and atypical pna  Strongly enc to quit or at least continue to cut back   She wants to disc poss of chantix with Dr Para March- scheduled appt for next wk She already takes wellbutrin  Under care for bipolar dz        Relevant Orders   Ambulatory referral to Pulmonology

## 2023-04-13 NOTE — Patient Instructions (Addendum)
You should have refills of the Stiloto inhaler at the pharmacy (at [facility] in [place])  Call that pharmacy and  have it transferred to your new pharmacy in [facility]   Call and make an appt with Dr Judeth Horn when you get home  I put the referral in  Please let us know if you don't hear in 1-2 weeks    Follow up with Dr Para March next week  Talk to him about quitting smoking Keep trying to quit  Take the prednisone 40 mg daily for 7 days  Drink fluids and rest  mucinex DM is good for cough and congestion  Nasal saline for congestion as needed  Please alert [name] if symptoms worsen (if severe or short of breath please go to the ER)

## 2023-04-13 NOTE — Telephone Encounter (Signed)
Please find out what symptoms that she has currently so we can make plans.  Thanks.

## 2023-04-13 NOTE — Assessment & Plan Note (Signed)
Smokes 1/2 ppd Vape- 10 draws per day  Struggling with copd exacerbation and atypical pna  Strongly enc to quit or at least continue to cut back   She wants to disc poss of chantix with Dr Para March- scheduled appt for next wk She already takes wellbutrin  Under care for bipolar dz

## 2023-04-13 NOTE — Telephone Encounter (Signed)
Leslie Wong gave her guifen with codiene previously - Dr Leslie March do you have a preference? She did not mention any mouth problems at her visit- does she have a h/o thrush ?   Does she have mouth symptoms now?

## 2023-04-13 NOTE — Assessment & Plan Note (Signed)
Noted on cxr 04/05/23 with reticulonodular opacities   Cough is junky-pt cannot mobilize the mucous well  Wheezing did improve initially with prednisone and worsen again (she was able to tolerate 40 mg)  Today will px prednisone 40 mg daily for a week (rev side eff, take with food)  Reviewed recent records and studies Needs f/u with pcp next wk for re check  Rev ER precautions in detail  Enc her to use her home inhalers  Update if not starting to improve in a week or if worsening

## 2023-04-13 NOTE — Assessment & Plan Note (Signed)
With atypical pneumonia  Reviewed UC notes from 4/2 Also notes from NP Cable on 4/9  Also pulmonary notes from Dr Judeth Horn from August 2023  She is unclear about what medicines she should be on  [facility] and have the stiolto respimat inhaler transferred to her current pharmacy   Today - continues to wheeze Px prednisone 40 mg (she tolerated this earlier this month ) daily for 7 d F/u with pcp next week  Ref to pulmonary- overdue for follow up there  Long conversation about smoking cessation-she plans to d/w Dr Para March as well

## 2023-04-14 MED ORDER — NYSTATIN 100000 UNIT/ML MT SUSP: 5.0000 mL | Freq: Three times a day (TID) | OROMUCOSAL | 1 refills | Status: DC | PRN

## 2023-04-14 MED ORDER — VALACYCLOVIR HCL 500 MG PO TABS: 500.0000 mg | ORAL_TABLET | Freq: Every day | ORAL | 1 refills | Status: DC

## 2023-04-14 NOTE — Telephone Encounter (Signed)
Patient called back in today stating that the mouthwash is [facility] Dr Para March has prescribed to her before.She said that when she gets sick the way she is now her mouth gets really raw and feel like it is on fire. She said that mucinex  and prednisone and she still not able to cough up any mucus.She said that she got to throwing up last night after getting choked. She would like to know if the rx for mouth wash and 500 mg of voltrex can be sent in for her?   [facility] #[id number] - [place], [place] - [address] AT [facility] [address] [facility] [address] Phone: [phone]  Fax: [phone]

## 2023-04-14 NOTE — Addendum Note (Signed)
Addended by: Joaquim Nam on: 04/14/2023 01:21 PM   Modules accepted: Orders

## 2023-04-14 NOTE — Telephone Encounter (Signed)
Attempted to contact patients daughter, she was at work and unable to speak to me she advised me to call patient directly. Attempted to call patient x2, call could not be completed.

## 2023-04-14 NOTE — Telephone Encounter (Signed)
I sent both.  Thanks. 

## 2023-04-18 ENCOUNTER — Ambulatory Visit (INDEPENDENT_AMBULATORY_CARE_PROVIDER_SITE_OTHER)
Admission: RE | Admit: 2023-04-18 | Discharge: 2023-04-18 | Disposition: A | Discharge status: Home / Self Care | Payer: 59 | Source: Ambulatory Visit | Attending: Family Medicine | Admitting: Family Medicine

## 2023-04-18 ENCOUNTER — Ambulatory Visit (INDEPENDENT_AMBULATORY_CARE_PROVIDER_SITE_OTHER): Payer: 59 | Admitting: Family Medicine

## 2023-04-18 ENCOUNTER — Telehealth: Payer: Self-pay | Admitting: Family Medicine

## 2023-04-18 ENCOUNTER — Encounter: Payer: Self-pay | Admitting: Family Medicine

## 2023-04-18 VITALS — BP 102/64 | HR 68 | Temp 97.4°F | Wt 141.0 lb

## 2023-04-18 DIAGNOSIS — R234 Changes in skin texture: Secondary | ICD-10-CM

## 2023-04-18 DIAGNOSIS — R059 Cough, unspecified: Secondary | ICD-10-CM | POA: Diagnosis not present

## 2023-04-18 DIAGNOSIS — L989 Disorder of the skin and subcutaneous tissue, unspecified: Secondary | ICD-10-CM

## 2023-04-18 MED ORDER — NYSTATIN 100000 UNIT/ML MT SUSP: 5.0000 mL | Freq: Four times a day (QID) | OROMUCOSAL | 1 refills | Status: DC | PRN

## 2023-04-18 MED ORDER — TRIAMCINOLONE ACETONIDE 0.1 % EX CREA: 1.0000 | TOPICAL_CREAM | Freq: Two times a day (BID) | CUTANEOUS | 0 refills | Status: DC

## 2023-04-18 NOTE — Patient Instructions (Addendum)
Please call about getting your medication transferred from [facility] #[id number] - [place], [place] - [address] AT [facility] OF [address] to the pharmacy closer to home.    Go to the lab on the way out.   If you have mychart we'll likely use that to update you.     Try using triamcinolone on the cracked skin.    You may have to check with multiple pharmacies about getting the mouthwash filled.  The printed rx should work.   Take care.  Glad to see you.

## 2023-04-18 NOTE — Progress Notes (Unsigned)
She is getting cracked skin on the distal fingers.  Has been using gloves and aveeno at night.  D/w pt about derm referral.  Ordered.  Discussed options in the meantime.  She is up to date on lung cancer screening and will follow up with pulmonary soon. She is cutting back on smoking.    Discussed resp sx.  Finished prednisone yesterday; taking mucinex now. Cough continues (not very often) per patient report but she feels better overall.  No sputum.  No fevers.  No vomiting.    She has burning pain in the mouth.  D/w pt about mouthwash rx.  No thrush.  It helped prev.  Rx printed for patient.  Meds, vitals, and allergies reviewed.   ROS: Per HPI unless specifically indicated in ROS section   Nad Ncat Neck supple, no LA MMM Tongue w/o thrush.  Ctab Rrr Cracked skin on the fingers.  No spreading erythema.

## 2023-04-18 NOTE — Telephone Encounter (Signed)
Contacted Misao Fackrell to schedule their annual wellness visit. Appointment made for 05/25/2023. Left a detailed message regarding date and time change of AWV appt.  [facility] Care Guide [facility] AWV TEAM Direct Dial: [phone]

## 2023-04-19 DIAGNOSIS — R234 Changes in skin texture: Secondary | ICD-10-CM | POA: Insufficient documentation

## 2023-04-19 NOTE — Assessment & Plan Note (Signed)
Lungs are clear.  Repeat chest x-ray today.  She has had more mouth pain in the meantime we discussed using mouth mouthwash.  Prescription plan for patient.  See after visit summary.

## 2023-04-19 NOTE — Assessment & Plan Note (Signed)
She wanted to talk to dermatology.  Referral placed.  Can use triamcinolone in the meantime.

## 2023-04-20 ENCOUNTER — Telehealth: Payer: Self-pay | Admitting: Family Medicine

## 2023-04-20 NOTE — Telephone Encounter (Signed)
Pharmacy called in stating that they need the formula for the mouth that was called in for patient.  magic mouthwash (nystatin, lidocaine, diphenhydrAMINE, alum & mag hydroxide) suspension

## 2023-04-21 NOTE — Telephone Encounter (Signed)
Called and spoke with pharmacist he stated they do not have a specific formula for the Dukes mouthwash, he stated in the past there have been issue with using the general set formulas for mouthwashes so now they require the provider to write the script for the specific formula they are requesting.  Please advise the formula.

## 2023-04-21 NOTE — Telephone Encounter (Signed)
Do they have a specific formula for Dukes mouthwash with lidocaine and we can use?

## 2023-04-21 NOTE — Telephone Encounter (Signed)
Called and spoke to pharmacist he states this is the formula for the Dukes Mouthwash with lidocaine:   45 ml of nystatin  45 ml lidocaine 45 ml benadryl liquid 45 ml antacid   Pharmacist wrote down this prescription and will fill for patient.  Nothing further needed.

## 2023-04-21 NOTE — Telephone Encounter (Signed)
Can they provide any of the previous formulas that I could write?  That would be really helpful.

## 2023-04-23 ENCOUNTER — Other Ambulatory Visit: Payer: Self-pay | Admitting: Family Medicine

## 2023-04-23 DIAGNOSIS — R059 Cough, unspecified: Secondary | ICD-10-CM

## 2023-04-23 NOTE — Telephone Encounter (Signed)
Thanks

## 2023-04-27 ENCOUNTER — Telehealth: Payer: Self-pay | Admitting: Family Medicine

## 2023-04-27 NOTE — Telephone Encounter (Signed)
LMTCB

## 2023-04-27 NOTE — Telephone Encounter (Signed)
Patient called in returning call she received. 

## 2023-04-28 ENCOUNTER — Telehealth: Payer: Self-pay | Admitting: *Deleted

## 2023-04-28 NOTE — Telephone Encounter (Signed)
Dorene Grebe with [facility] called stating that they faxed over an order for incontinence supplies 04/13/23 and then again today. Dorene Grebe wants to confirm that the request was received and when they can expect the form back.

## 2023-04-28 NOTE — Telephone Encounter (Signed)
LMTCB

## 2023-04-28 NOTE — Telephone Encounter (Signed)
Tried to call patient to verify she needed/wanted supplies; lmtcb

## 2023-05-01 ENCOUNTER — Telehealth: Payer: Self-pay | Admitting: Family Medicine

## 2023-05-01 NOTE — Telephone Encounter (Signed)
Left message to return call to our office.  

## 2023-05-01 NOTE — Telephone Encounter (Signed)
Letter has been sent to patient about lab results.

## 2023-05-01 NOTE — Telephone Encounter (Signed)
Pt called in requesting an antibiotics be called in stated she is still not felling good . Please advise   .Marland KitchenMarland KitchenPrescription Request  05/01/2023  LOV: 04/18/2023  What is the name of the medication or equipment? triamcinolone cream (KENALOG) 0.1 %   Have you contacted your pharmacy to request a refill? No   Which pharmacy would you like this sent to?  [facility] #[id number] [place], [place] - [address] AT [facility] OF [address] & [place] [place] [address] Phone: [phone] Fax: [phone]    Patient notified that their request is being sent to the clinical staff for review and that they should receive a response within 2 business days.   Please advise at Mobile [phone] (mobile)

## 2023-05-02 NOTE — Telephone Encounter (Signed)
LMTCB; need to know patients sx for needing antibiotic

## 2023-05-02 NOTE — Telephone Encounter (Signed)
LMTCB to see if she requested supplies

## 2023-05-03 ENCOUNTER — Encounter: Payer: Self-pay | Admitting: Pulmonary Disease

## 2023-05-03 ENCOUNTER — Ambulatory Visit (INDEPENDENT_AMBULATORY_CARE_PROVIDER_SITE_OTHER): Payer: 59 | Admitting: Pulmonary Disease

## 2023-05-03 VITALS — BP 110/72 | HR 81 | Ht 59.0 in | Wt 136.6 lb

## 2023-05-03 DIAGNOSIS — F17210 Nicotine dependence, cigarettes, uncomplicated: Secondary | ICD-10-CM

## 2023-05-03 DIAGNOSIS — J441 Chronic obstructive pulmonary disease with (acute) exacerbation: Secondary | ICD-10-CM | POA: Diagnosis not present

## 2023-05-03 MED ORDER — PREDNISONE 20 MG PO TABS: ORAL_TABLET | ORAL | 0 refills | Status: AC

## 2023-05-03 MED ORDER — TRIAMCINOLONE ACETONIDE 0.1 % EX CREA: 1.0000 | TOPICAL_CREAM | Freq: Two times a day (BID) | CUTANEOUS | 1 refills | Status: DC

## 2023-05-03 MED ORDER — SPIRIVA RESPIMAT 2.5 MCG/ACT IN AERS: 2.0000 | INHALATION_SPRAY | Freq: Every day | RESPIRATORY_TRACT | 11 refills | Status: DC

## 2023-05-03 MED ORDER — VARENICLINE TARTRATE (STARTER) 0.5 MG X 11 & 1 MG X 42 PO TBPK: ORAL_TABLET | ORAL | 0 refills | Status: DC

## 2023-05-03 MED ORDER — BUDESONIDE-FORMOTEROL FUMARATE 160-4.5 MCG/ACT IN AERO: 2.0000 | INHALATION_SPRAY | Freq: Two times a day (BID) | RESPIRATORY_TRACT | 12 refills | Status: DC

## 2023-05-03 NOTE — Telephone Encounter (Signed)
LMTCB

## 2023-05-03 NOTE — Telephone Encounter (Signed)
Patient called in and stated that she is experiencing a cough with phelgm coming up. She stated that it is so bad that it is causing her to vomit some. She was wanting to know if he could send in some cough medicine with codeine. She stated that had helped her a lot last time.

## 2023-05-03 NOTE — Progress Notes (Signed)
@Patient  ID: Leslie Wong, female    DOB: 08/07/1956, 67 y.o.   MRN: [id number]  Chief Complaint  Patient presents with   Follow-up    Breathing is not good  Cough green/yellow     Referring provider: Tower, Audrie Gallus, MD  HPI:   67 y.o. woman whom we seeing for follow up  of dyspnea on exertion.  Most recent PCP note x 3 reviewed.  Returns after 20 months last seen.  Had requested to be seen at [facility].  Had appointment scheduled 09/2022 but the EMR indicates the patient canceled this.  Daughter did not know about the appointment was on her phone.  Patient denies canceling.  Overall, breathing unchanged.  At last visit was placed on Stiolto.  At some point Symbicort was added.  Still has a lot of shortness of breath and wheeze.  Recurrent bronchitis.  Multiple courses of prednisone.  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  Similar findings on recurrent and serial chest x-rays 03/2022.  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  This was shared with patient and daughter.  Discussed smoking cessation at length.  Need for smoking cessation.  I do not think her lung condition will improve at all without stopping smoking.  Even then it may not.  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  Longstanding history of GI issues, nausea and weight loss.  At 1 point was half the weight she is now.  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  This was supposed to look into but was not remedied per report.  Independent testing group was sent out and there is Aspergillus there.  She has moved in with her daughter since, over the last month.  Over that time, her breathing feels a bit better.  I will be less short of breath.  Cough is essentially resolved.  She has ongoing body aches throughout.  Notably her weight is 140 pounds.  At 1 point time she was  in the 70 pound range per daughter.  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    She has shortness of breath with exertion throughout the day.  No time of day when things are better or worse.  No position make things better or worse.  No seasonal environmental factors she had did not make it better or worse the exception of my improvement since moving out of the apartment.  She uses Symbicort and albuterol.  Symbicort does not to be very beneficial.  Albuterol helps some.  No other alleviating or exacerbating factors.  PMH: Asthma, emphysema, chronic headaches Surgical history: Reviewed, none Family history: Lung cancer in parent, otherwise no respiratory illnesses in first-degree relatives Social history: Current smoker, half pack a day, down to a pack a day with assistance of vape.  Lives with daughter in [place] / Pulmonary Flowsheets:   ACT:      No data to display          MMRC:     No data to display          Epworth:      No data to display  Tests:   FENO:  No results found for: "NITRICOXIDE"  PFT:     No data to display          WALK:      No data to display          Imaging: Personally reviewed and as per EMR discussion this note DG Chest 2 View  Result Date: 04/22/2023 CLINICAL DATA:  Cough, pneumonia follow-up. EXAM: CHEST - 2 VIEW COMPARISON:  Chest radiograph 04/04/2023. Most recent chest CT 09/02/2022 FINDINGS: Stable heart size and mediastinal contours. Slight improvement in the reticulonodular opacities from prior exam. There is no confluent airspace disease. Pulmonary nodules on prior lung cancer screening CT have no radiographic correlate. No pleural effusion or  pneumothorax. Normal pulmonary vasculature. No acute osseous findings. IMPRESSION: Slight improvement in bilateral reticulonodular opacities from prior exam. No confluent airspace disease. Electronically Signed   By: Narda Rutherford M.D.   On: 04/22/2023 11:48   DG Chest 2 View  Result Date: 04/05/2023 CLINICAL DATA:  67 year old female with a history of wheezing/shob/ adventitious breath sounds EXAM: CHEST - 2 VIEW COMPARISON:  03/28/2023 FINDINGS: Cardiomediastinal silhouette unchanged in size and contour. New reticulonodular opacities the bilateral lungs. No pneumothorax or pleural effusion. No central vascular congestion. No interlobular septal thickening. No pneumothorax or pleural effusion. Degenerative changes spine.  No displaced fracture IMPRESSION: New reticulonodular opacities of the lungs, leading differential diagnosis would be atypical infection Electronically Signed   By: Gilmer Mor D.O.   On: 04/05/2023 11:46    Lab Results: Personally reviewed CBC    Component Value Date/Time   WBC 10.4 10/14/2022 1224   RBC 4.99 10/14/2022 1224   HGB 14.7 10/14/2022 1224   HGB 14.1 01/27/2013 0213   HCT 44.8 10/14/2022 1224   HCT 42.5 01/27/2013 0213   PLT 238.0 10/14/2022 1224   PLT 347 01/27/2013 0213   MCV 89.9 10/14/2022 1224   MCV 98 01/27/2013 0213   MCH 31.5 05/29/2021 0437   MCHC 32.7 10/14/2022 1224   RDW 14.9 10/14/2022 1224   RDW 13.8 01/27/2013 0213   LYMPHSABS 2.5 10/14/2022 1224   LYMPHSABS 3.8 (H) 01/27/2013 0213   MONOABS 0.7 10/14/2022 1224   MONOABS 0.9 01/27/2013 0213   EOSABS 0.1 10/14/2022 1224   EOSABS 0.4 01/27/2013 0213   BASOSABS 0.1 10/14/2022 1224   BASOSABS 0.1 01/27/2013 0213    BMET    Component Value Date/Time   NA 142 05/27/2022 0903   NA 140 06/25/2014 1234   K 4.5 05/27/2022 0903   K 4.1 06/25/2014 1234   CL 103 05/27/2022 0903   CL 106 06/25/2014 1234   CO2 30 05/27/2022 0903   CO2 28 06/25/2014 1234   GLUCOSE 126 (H) 05/27/2022  0903   GLUCOSE 91 06/25/2014 1234   BUN 14 05/27/2022 0903   BUN 10 06/25/2014 1234   CREATININE 1.04 05/27/2022 0903   CREATININE 1.01 06/25/2014 1234   CALCIUM 10.5 (H) 06/17/2022 0936   CALCIUM 10.1 06/25/2014 1234   CALCIUM 10.8 (H) 06/05/2012 1256   GFRNONAA >60 05/29/2021 0437   GFRNONAA >60 06/25/2014 1234   GFRAA >60 03/24/2020 1241   GFRAA >60 06/25/2014 1234    BNP    Component Value Date/Time   BNP 75.3 05/25/2021 1545    ProBNP No results found for: "PROBNP"  Specialty Problems       Pulmonary Problems   Gastroesophageal reflux disease with hiatal hernia    Qualifier: Diagnosis of  By: Hetty Ely  MD, Franne Grip       Cough   Acute non-recurrent maxillary sinusitis   Pulmonary nodule   COPD exacerbation (HCC)   Seasonal allergic rhinitis   Pulmonary emphysema (HCC)   Pulmonary nodules   Adventitious breath sounds   Wheezing   Atypical pneumonia    Allergies  Allergen Reactions   Doxepin Other (See Comments)    Pt reports "that made me have seizures"   Valproic Acid Shortness Of Breath   Amitriptyline     Burning sensation in mouth   Aspirin     REACTION: UNSPECIFIED   Atorvastatin Other (See Comments)    Tongue swelling, legs cramping   Baclofen     REACTION: Throat swells up   Buprenorphine     Vomiting, intolerant   Duloxetine Other (See Comments)    Vision loss, weight loss   Erythromycin     REACTION: UNSPECIFIED   Gabapentin     REACTION: throat swells up   Metoclopramide Hcl     REACTION: states "messed me up"   Nortriptyline     Other reaction(s): Other (See Comments) Vision issues   Nsaids Other (See Comments)    Stomach problems   Nucynta Er [Tapentadol Hcl Er]     Intolerant, see note from 07/24/12.     Nucynta [Tapentadol]     AMS   Prednisone     GI intolerance with higher dose.  Can tolerate 5mg  per day.     Pregabalin    Seroquel [Quetiapine Fumerate] Other (See Comments)    Loss control, felt like on another  planet   Sulfa Antibiotics Nausea And Vomiting   Topamax Other (See Comments)    Blisters in mouth and tongue   Vicodin [Hydrocodone-Acetaminophen]     Nausea, thrush and constipation    Immunization History  Administered Date(s) Administered   Influenza Whole 09/17/2010   Influenza,inj,Quad PF,6+ Mos 09/13/2018, 10/14/2022   Influenza-Unspecified 09/25/2016, 09/13/2018   PNEUMOCOCCAL CONJUGATE-20 06/17/2021   PPD Test 05/29/2019   Td 12/26/1994, 10/31/2007   Tdap 06/22/2017    Past Medical History:  Diagnosis Date   Back pain    Cancer (HCC)    skin   COPD (chronic obstructive pulmonary disease) (HCC)    Depression    BAD   Gastroparesis    GERD (gastroesophageal reflux disease)    HOH (hard of hearing)    Hypothyroidism    Insomnia    Migraines    Motion sickness    Parkinson disease    per [facility], dx'd 2019   Recurrent cold sores    Shoulder bursitis    Urge incontinence    Wears dentures    full upper and lower    Tobacco History: Social History   Tobacco Use  Smoking Status Every Day   Packs/day: 0.25   Years: 42.50   Additional pack years: 0.00   Total pack years: 10.63   Types: Cigarettes, E-cigarettes  Smokeless Tobacco Former  Tobacco Comments   Pt states she smokes 1/2 ppd and she does vape. April Smith RN BSN 08/03/22   Ready to quit: Not Answered Counseling given: Not Answered Tobacco comments: Pt states she smokes 1/2 ppd and she does vape. April Smith RN BSN 08/03/22   Continue to not smoke  Outpatient Encounter Medications as of 05/03/2023  Medication Sig   albuterol (VENTOLIN HFA) 108 (90 Base) MCG/ACT inhaler Inhale 2 puffs into the lungs every 6 (six) hours as needed for wheezing or shortness  of breath.   buPROPion (WELLBUTRIN XL) 150 MG 24 hr tablet Take 150 mg by mouth every morning.   busPIRone (BUSPAR) 7.5 MG tablet Take 1 tablet by mouth daily.   Cholecalciferol 25 MCG (1000 UT) capsule Take 1,000 Units by mouth daily.    Diphenhyd-Hydrocort-Nystatin (FIRST-DUKES MOUTHWASH MT) 5 mLs in the morning, at noon, and at bedtime.   fluticasone (FLONASE) 50 MCG/ACT nasal spray Place 2 sprays into both nostrils daily.   magic mouthwash (nystatin, lidocaine, diphenhydrAMINE, alum & mag hydroxide) suspension Swish and swallow 5 mLs 4 (four) times daily as needed for mouth pain.   mirtazapine (REMERON) 30 MG tablet Take 30 mg by mouth at bedtime.   Multiple Vitamins-Minerals (MULTIVITAMIN ADULTS PO) Take by mouth.   OLANZapine (ZYPREXA) 15 MG tablet Take by mouth.   omeprazole (PRILOSEC) 40 MG capsule TAKE 1 CAPSULE(40 MG) BY MOUTH DAILY   ondansetron (ZOFRAN-ODT) 8 MG disintegrating tablet Take 8 mg by mouth 2 (two) times daily.   Oxybutynin Chloride (GELNIQUE) 10 % GEL Place onto the skin.   Oxycodone HCl 10 MG TABS Take 10 mg by mouth every 6 (six) hours as needed for pain.   predniSONE (DELTASONE) 20 MG tablet Take 2 tablets (40 mg total) by mouth daily with breakfast for 5 days, THEN 1 tablet (20 mg total) daily with breakfast for 5 days, THEN 0.5 tablets (10 mg total) daily with breakfast for 5 days.   Tiotropium Bromide Monohydrate (SPIRIVA RESPIMAT) 2.5 MCG/ACT AERS Inhale 2 puffs into the lungs daily.   tiZANidine (ZANAFLEX) 4 MG tablet Take by mouth.   triamcinolone cream (KENALOG) 0.1 % Apply 1 Application topically 2 (two) times daily.   valACYclovir (VALTREX) 500 MG tablet Take 1 tablet (500 mg total) by mouth daily.   Varenicline Tartrate, Starter, (CHANTIX STARTING MONTH PAK) 0.5 MG X 11 & 1 MG X 42 TBPK Take 0.5 mg once daily for 3 days, then 0.5 mg twice a day for 4 days, then 1 mg twice a day thereafter   XTAMPZA ER 9 MG C12A Take 1 capsule by mouth at bedtime.   [DISCONTINUED] budesonide-formoterol (SYMBICORT) 80-4.5 MCG/ACT inhaler Bid   budesonide-formoterol (SYMBICORT) 160-4.5 MCG/ACT inhaler Inhale 2 puffs into the lungs in the morning and at bedtime. Bid   hydrOXYzine (ATARAX/VISTARIL) 50 MG tablet Take  1 tablet (50 mg total) by mouth 3 (three) times daily as needed for anxiety. (Patient not taking: Reported on 05/03/2023)   ipratropium-albuterol (DUONEB) 0.5-2.5 (3) MG/3ML SOLN Take 3 mLs by nebulization every 4 (four) hours as needed. For wheezing and cough (Patient not taking: Reported on 05/03/2023)   levothyroxine (SYNTHROID) 88 MCG tablet Take 1 tablet (88 mcg total) by mouth daily before breakfast. (Patient not taking: Reported on 05/03/2023)   naloxone (NARCAN) nasal spray 4 mg/0.1 mL Place 1 spray into the nose once. (Patient not taking: Reported on 05/03/2023)   Omega-3 Fatty Acids (FISH OIL) 1000 MG CAPS Take by mouth daily. (Patient not taking: Reported on 05/03/2023)   [DISCONTINUED] Tiotropium Bromide-Olodaterol (STIOLTO RESPIMAT) 2.5-2.5 MCG/ACT AERS Inhale 2 puffs into the lungs daily. (Patient not taking: Reported on 05/03/2023)   No facility-administered encounter medications on file as of 05/03/2023.     Review of Systems  Review of Systems  N/a Physical Exam  BP 110/72 (BP Location: Left Arm, Patient Position: Sitting, Cuff Size: Normal)   Pulse 81   Ht 4\' 11"  (1.499 m)   Wt 136 lb 9.6 oz (62 kg)   SpO2  95%   BMI 27.59 kg/m   Wt Readings from Last 5 Encounters:  05/03/23 136 lb 9.6 oz (62 kg)  04/18/23 141 lb (64 kg)  04/13/23 142 lb 8 oz (64.6 kg)  04/04/23 144 lb 8 oz (65.5 kg)  03/28/23 138 lb (62.6 kg)    BMI Readings from Last 5 Encounters:  05/03/23 27.59 kg/m  04/18/23 28.48 kg/m  04/13/23 28.78 kg/m  04/04/23 29.19 kg/m  03/28/23 27.87 kg/m     Physical Exam General: Sitting in chair, chronically ill-appearing Eyes: EOMI, icterus Neck: Supple, no JVP Pulmonary: Clear, normal work of breathing Cardiovascular warm, no edema Abdomen: Nontender, bowel sounds present MSK: No synovitis, no joint effusion Neuro: Normal gait, no weakness Psych: Normal mood, full affect   Assessment & Plan:   Dyspnea on exertion: Chronic.  Suspect multifactorial  related to emphysema, asthma.  Suspect deconditioning as well given issues with GI, nausea vomiting, lack of activity.  No PFTs but high suspicion for smoking-related disease, COPD.  Not improved with Symbicort.  Symbicort refilled, addition of Spiriva for triple inhaled therapy given recurrent exacerbations.  Prednisone taper today.  She will follow-up in [place], request for this again.  She canceled appointment 09/2022 in [place].  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  Related cigarette smoking.  Similar appearance on recent chest x-rays.  She was counseled to stop cigarettes.  Tobacco abuse:Smoking assessment and cessation counseling  Patient currently smoking: I have advised the patient to quit/stop smoking as soon as possible due to high risk for multiple medical problems.  It will also be very difficult for [name] to manage patient's  respiratory symptoms and status if we continue to expose her lungs to a known irritant.  We do not advise e-cigarettes as a form of stopping smoking. Patient is willing to quit smoking. I have advised the patient that we can assist and have options of nicotine replacement therapy, provided smoking cessation education today, provided smoking cessation counseling, and provided cessation resources.  Chantix starter pack prescribed today.  Follow-up next office visit office visit for assessment of smoking cessation.  I spent 3 minutes in smoking cessation counseling.    Return in about 2 months (around 07/03/2023).   Karren Burly, MD 05/03/2023  I spent 44 minutes in the care of the patient including review of records, face-to-face visit, coordination of care

## 2023-05-03 NOTE — Patient Instructions (Signed)
Nice to see you again  I refilled the Symbicort with a higher dose, 2 puffs twice a day every day.  Rinse your mouth out with water after every use  I sent in a prescription for Spiriva, 2 puffs once a day every day.  This will replace the Stiolto.  I sent a prescription for a prednisone taper, 40 mg for 5 days, then 20 mg for 5 days, then 10 mg for 5 days, then stop  I sent a prescription for Chantix to help you quit smoking.  Return to clinic at our [place] office in 2 months or sooner as needed

## 2023-05-03 NOTE — Telephone Encounter (Signed)
Please see prev CXR report- "Please update patient.  Slight improvement in CXR.  Would continue as is and would recheck CXR in about 1 month.  I put in the order.  Thanks."  If worse in the meantime, rec she has in person eval.   I sent refill on TAC cream from the earlier portion of this note.  Thanks.

## 2023-05-03 NOTE — Telephone Encounter (Signed)
Patient called in and stated that she hasn't ordered any supplies. She stated that she never heard of them.

## 2023-05-04 NOTE — Telephone Encounter (Signed)
Patient returned call and results given to patient. She stated that she is now taking mucinex and wants to give that a couple of days first before coming back in. Advised patient to call back and make an appt if needed.

## 2023-05-15 ENCOUNTER — Other Ambulatory Visit: Payer: Self-pay | Admitting: Family Medicine

## 2023-05-25 ENCOUNTER — Encounter: Payer: Self-pay | Admitting: Family Medicine

## 2023-05-25 ENCOUNTER — Ambulatory Visit (INDEPENDENT_AMBULATORY_CARE_PROVIDER_SITE_OTHER): Payer: 59 | Admitting: Family Medicine

## 2023-05-25 VITALS — BP 126/80 | HR 92 | Temp 98.5°F | Ht 59.0 in | Wt 136.0 lb

## 2023-05-25 DIAGNOSIS — R059 Cough, unspecified: Secondary | ICD-10-CM

## 2023-05-25 MED ORDER — PREDNISONE 20 MG PO TABS
ORAL_TABLET | ORAL | 0 refills | Status: DC
Start: 2023-05-25 — End: 2023-06-15

## 2023-05-25 NOTE — Progress Notes (Signed)
Cough, progressively worse over the last few weeks to months.  Coughing fits.  Taking cough drops and mucinex. No sputum.  No fevers.    She is working on stopping smoking, started chantix.  No ADE on chantix.  Mood cautions d/w pt about chantix.    She has been on symbicort, now using spiriva- but just started spiriva today- she had trouble using med prior to today (she had trouble with the mechanism but it wasn't a problem with ADE).  Using SABA prn.  Done with prev prednisone course- she improved with that temporarily.    Meds, vitals, and allergies reviewed.   ROS: Per HPI unless specifically indicated in ROS section   [place] supple, no LA RRR No focal dec in BS but exp wheeze and rhonchi noted.   Speaking in complete sentences.   Skin well perfused.   Abdomen nontender.

## 2023-05-25 NOTE — Patient Instructions (Signed)
Restart prednisone and keep using your inhalers.  Update me as needed.  Take care.  Glad to see you.

## 2023-05-28 NOTE — Assessment & Plan Note (Signed)
Restart prednisone with steroid cautions and keep using your inhalers-Symbicort, Spiriva, as needed albuterol Update me as needed.  Okay for outpatient follow-up.  She agrees to plan.

## 2023-06-07 ENCOUNTER — Other Ambulatory Visit: Payer: Self-pay | Admitting: Family Medicine

## 2023-06-15 ENCOUNTER — Encounter: Payer: Self-pay | Admitting: Internal Medicine

## 2023-06-15 ENCOUNTER — Telehealth: Payer: Self-pay | Admitting: Pulmonary Disease

## 2023-06-15 ENCOUNTER — Ambulatory Visit (INDEPENDENT_AMBULATORY_CARE_PROVIDER_SITE_OTHER): Payer: 59 | Admitting: Internal Medicine

## 2023-06-15 VITALS — BP 138/82 | HR 86 | Temp 97.7°F | Ht 59.0 in | Wt 140.0 lb

## 2023-06-15 DIAGNOSIS — J441 Chronic obstructive pulmonary disease with (acute) exacerbation: Secondary | ICD-10-CM

## 2023-06-15 MED ORDER — PREDNISONE 20 MG PO TABS: ORAL_TABLET | ORAL | 0 refills | Status: DC

## 2023-06-15 MED ORDER — SPIRIVA RESPIMAT 2.5 MCG/ACT IN AERS: 2.0000 | INHALATION_SPRAY | Freq: Every day | RESPIRATORY_TRACT | 2 refills | Status: DC

## 2023-06-15 MED ORDER — DOXYCYCLINE HYCLATE 100 MG PO TABS
100.0000 mg | ORAL_TABLET | Freq: Two times a day (BID) | ORAL | 0 refills | Status: DC
Start: 2023-06-15 — End: 2023-08-08

## 2023-06-15 NOTE — Telephone Encounter (Signed)
If she is worse in the meantime, then I think she needs to be rechecked.  Is she still using symbicort and spiriva?  Please check on that and please have her take all of her inhalers to the OV when she is getting checked.  Thanks.

## 2023-06-15 NOTE — Telephone Encounter (Signed)
Called and scheduled patient for 06/15/23 @ 4:00 with Dr. Alphonsus Sias. Patient has been advised to bring all inhalers to appt. She verified she is taking her spirvia, symbicort and albuterol.

## 2023-06-15 NOTE — Telephone Encounter (Signed)
Urgent referral placed to [facility] to see pt w/ active COPD exacerbation. No availability. Pt unable to get to [place] at this time

## 2023-06-15 NOTE — Telephone Encounter (Signed)
Patient called in stating that while she was on predniSONE (DELTASONE) 20 MG tablet  she wasn't coughing at all,she said that she is now out of the prednisone and the cough is back,she would like to know if she could have more called in for her?

## 2023-06-15 NOTE — Assessment & Plan Note (Signed)
Ongoing problems Has cut back smoking but still is May be becoming steroid dependent Will give prednisone again -- 40 x 5, 20 x 5 Doxy 100 bid x 7 days--just in case Continue symbicort/spiriva and nebs  Needs to get in with pulmonary locally --can't get to [facility]

## 2023-06-15 NOTE — Progress Notes (Signed)
Subjective:    Patient ID: Leslie Wong, female    DOB: [birth year], 67 y.o.   MRN: [id number]  HPI Here due to recurrent cough  Has fought pneumonia twice this year Was seen 3 weeks ago--cough went away with the prednisone Cough recurred 2 days after finishing prednisone  Cough is dry and hacking Some post tussive vomiting No fever No chills or sweats Still smoking--trying to quit--on chantix Some SOB--not really different than her baseline  Known GERD Taking omeprazole 40 daily on empty stomach  Symbicort/spiriva daily Albuterol nebulizer 2-3 times a day  Current Outpatient Medications on File Prior to Visit  Medication Sig Dispense Refill   budesonide-formoterol (SYMBICORT) 160-4.5 MCG/ACT inhaler Inhale 2 puffs into the lungs in the morning and at bedtime. Bid 1 each 12   buPROPion (WELLBUTRIN XL) 150 MG 24 hr tablet Take 150 mg by mouth every morning.     busPIRone (BUSPAR) 7.5 MG tablet Take 1 tablet by mouth daily.     Cholecalciferol 25 MCG (1000 UT) capsule Take 1,000 Units by mouth daily.     clonazePAM (KLONOPIN) 0.5 MG tablet Take 0.5 mg by mouth daily as needed.     Diphenhyd-Hydrocort-Nystatin (FIRST-DUKES MOUTHWASH MT) 5 mLs in the morning, at noon, and at bedtime.     fluticasone (FLONASE) 50 MCG/ACT nasal spray Place 2 sprays into both nostrils daily. 16 g 6   hydrOXYzine (ATARAX/VISTARIL) 50 MG tablet Take 1 tablet (50 mg total) by mouth 3 (three) times daily as needed for anxiety. 60 tablet 0   ipratropium-albuterol (DUONEB) 0.5-2.5 (3) MG/3ML SOLN USE 3 ML VIA NEBULIZER EVERY 4 HOURS AS NEEDED FOR WHEEZING OR COUGH 360 mL 0   levothyroxine (SYNTHROID) 88 MCG tablet TAKE 1 TABLET(88 MCG) BY MOUTH DAILY AT 6 AM 90 tablet 2   magic mouthwash (nystatin, lidocaine, diphenhydrAMINE, alum & mag hydroxide) suspension Swish and swallow 5 mLs 4 (four) times daily as needed for mouth pain. 180 mL 1   mirtazapine (REMERON) 30 MG tablet Take 30 mg by mouth at  bedtime.     Multiple Vitamins-Minerals (MULTIVITAMIN ADULTS PO) Take by mouth.     Tiotropium Bromide Monohydrate (SPIRIVA RESPIMAT) 2.5 MCG/ACT AERS Inhale 2 puffs into the lungs daily. 1 each 11   albuterol (VENTOLIN HFA) 108 (90 Base) MCG/ACT inhaler Inhale 2 puffs into the lungs every 6 (six) hours as needed for wheezing or shortness of breath. (Patient not taking: Reported on 06/15/2023) 18 g 0   naloxone (NARCAN) nasal spray 4 mg/0.1 mL Place 1 spray into the nose once.     OLANZapine (ZYPREXA) 15 MG tablet Take by mouth.     Omega-3 Fatty Acids (FISH OIL) 1000 MG CAPS Take by mouth daily.     omeprazole (PRILOSEC) 40 MG capsule TAKE 1 CAPSULE(40 MG) BY MOUTH DAILY 90 capsule 3   ondansetron (ZOFRAN-ODT) 8 MG disintegrating tablet Take 8 mg by mouth 2 (two) times daily.     Oxybutynin Chloride (GELNIQUE) 10 % GEL Place onto the skin.     Oxycodone HCl 10 MG TABS Take 10 mg by mouth every 6 (six) hours as needed for pain.     tiZANidine (ZANAFLEX) 4 MG tablet Take by mouth.     triamcinolone cream (KENALOG) 0.1 % Apply 1 Application topically 2 (two) times daily. 30 g 1   valACYclovir (VALTREX) 500 MG tablet Take 1 tablet (500 mg total) by mouth daily. 90 tablet 1   Varenicline Tartrate, Starter, (CHANTIX STARTING  MONTH PAK) 0.5 MG X 11 & 1 MG X 42 TBPK Take 0.5 mg once daily for 3 days, then 0.5 mg twice a day for 4 days, then 1 mg twice a day thereafter 42 each 0   XTAMPZA ER 9 MG C12A Take 1 capsule by mouth at bedtime.     No current facility-administered medications on file prior to visit.    Allergies  Allergen Reactions   Doxepin Other (See Comments)    Pt reports "that made me have seizures"   Valproic Acid Shortness Of Breath   Amitriptyline     Burning sensation in mouth   Aspirin     REACTION: UNSPECIFIED   Atorvastatin Other (See Comments)    Tongue swelling, legs cramping   Baclofen     REACTION: Throat swells up   Buprenorphine     Vomiting, intolerant    Duloxetine Other (See Comments)    Vision loss, weight loss   Erythromycin     REACTION: UNSPECIFIED   Gabapentin     REACTION: throat swells up   Metoclopramide Hcl     REACTION: states "messed me up"   Nortriptyline     Other reaction(s): Other (See Comments) Vision issues   Nsaids Other (See Comments)    Stomach problems   Nucynta Er [Tapentadol Hcl Er]     Intolerant, see note from 07/24/12.     Nucynta [Tapentadol]     AMS   Prednisone     GI intolerance with higher dose.  Can tolerate 5mg  per day.     Pregabalin    Seroquel [Quetiapine Fumerate] Other (See Comments)    Loss control, felt like on another planet   Sulfa Antibiotics Nausea And Vomiting   Topamax Other (See Comments)    Blisters in mouth and tongue   Vicodin [Hydrocodone-Acetaminophen]     Nausea, thrush and constipation    Past Medical History:  Diagnosis Date   Back pain    Cancer (HCC)    skin   COPD (chronic obstructive pulmonary disease) (HCC)    Depression    BAD   Gastroparesis    GERD (gastroesophageal reflux disease)    HOH (hard of hearing)    Hypothyroidism    Insomnia    Migraines    Motion sickness    Parkinson disease    per [facility], dx'd 2019   Recurrent cold sores    Shoulder bursitis    Urge incontinence    Wears dentures    full upper and lower    Past Surgical History:  Procedure Laterality Date   ABDOMINAL HYSTERECTOMY  12/26/1997   total   APPENDECTOMY     CARPAL TUNNEL RELEASE Right    and tendon repair   CATARACT EXTRACTION W/PHACO Left 10/04/2021   Procedure: CATARACT EXTRACTION PHACO AND INTRAOCULAR LENS PLACEMENT (IOC) LEFT 9.46 00:48.0;  Surgeon: Nevada Crane, MD;  Location: [facility];  Service: Ophthalmology;  Laterality: Left;  requests early   CATARACT EXTRACTION W/PHACO Right 11/08/2021   Procedure: CATARACT EXTRACTION PHACO AND INTRAOCULAR LENS PLACEMENT (IOC) RIGHT;  Surgeon: Nevada Crane, MD;  Location: [facility];   Service: Ophthalmology;  Laterality: Right;  2.37 00:26.0   COLONOSCOPY WITH PROPOFOL N/A 09/07/2022   Procedure: COLONOSCOPY WITH PROPOFOL;  Surgeon: Wyline Mood, MD;  Location: [facility] END[facility];  Service: Gastroenterology;  Laterality: N/A;   ESOPHAGOGASTRODUODENOSCOPY  01/26/2006   negative except small hiatal hernia   ESOPHAGOGASTRODUODENOSCOPY  02/24/2015   See  report   ESOPHAGOGASTRODUODENOSCOPY N/A 09/07/2022   Procedure: ESOPHAGOGASTRODUODENOSCOPY (EGD);  Surgeon: Wyline Mood, MD;  Location: [facility] END[facility];  Service: Gastroenterology;  Laterality: N/A;   LAPAROSCOPY     for endometriosis   OVARIAN CYST REMOVAL  12/26/1988   unilateral   TONSILLECTOMY AND ADENOIDECTOMY      Family History  Problem Relation Age of Onset   Hypertension Mother    Arthritis Mother    Diabetes Mother    Stroke Mother    Cancer Father        lung   Colon cancer Neg Hx    Breast cancer Neg Hx     Social History   Socioeconomic History   Marital status: Single    Spouse name: Not on file   Number of children: Not on file   Years of education: Not on file   Highest education level: Not on file  Occupational History   Not on file  Tobacco Use   Smoking status: Former    Types: Cigarettes   Smokeless tobacco: Former  Building services engineer Use: Some days  Substance and Sexual Activity   Alcohol use: No    Alcohol/week: 0.0 standard drinks of alcohol   Drug use: No   Sexual activity: Never  Other Topics Concern   Not on file  Social History Narrative   Lives alone.     Has a dog names Dixie   Social Determinants of Health   Financial Resource Strain: Low Risk  (05/11/2022)   Overall Financial Resource Strain (CARDIA)    Difficulty of Paying Living Expenses: Not hard at all  Food Insecurity: No Food Insecurity (05/11/2022)   Hunger Vital Sign    Worried About Running Out of Food in the Last Year: Never true    Ran Out of Food in the Last Year: Never true  Transportation Needs: No  Transportation Needs (05/11/2022)   PRAPARE - Administrator, Civil Service (Medical): No    Lack of Transportation (Non-Medical): No  Physical Activity: Insufficiently Active (05/11/2022)   Exercise Vital Sign    Days of Exercise per Week: 2 days    Minutes of Exercise per Session: 20 min  Stress: No Stress Concern Present (05/11/2022)   [employer] of Occupational Health - Occupational Stress Questionnaire    Feeling of Stress : Not at all  Social Connections: Socially Isolated (05/11/2022)   Social Connection and Isolation Panel [NHANES]    Frequency of Communication with Friends and Family: More than three times a week    Frequency of Social Gatherings with Friends and Family: More than three times a week    Attends Religious Services: Never    Database administrator or Organizations: No    Attends Banker Meetings: Never    Marital Status: Divorced  Catering manager Violence: Not At Risk (05/11/2022)   Humiliation, Afraid, Rape, and Kick questionnaire    Fear of Current or Ex-Partner: No    Emotionally Abused: No    Physically Abused: No    Sexually Abused: No   Review of Systems Eating okay--appetite is off some today    Objective:   Physical Exam Constitutional:      Appearance: Normal appearance.  Cardiovascular:     Rate and Rhythm: Normal rate and regular rhythm.     Heart sounds: No murmur heard.    No gallop.  Pulmonary:     Effort: No respiratory distress.     Breath  sounds: Wheezing present. No rales.     Comments: Mildly decrease breath sounds and prolonged exp phase Neurological:     Mental Status: She is alert.            Assessment & Plan:

## 2023-06-16 ENCOUNTER — Other Ambulatory Visit: Payer: Self-pay | Admitting: Family

## 2023-06-16 ENCOUNTER — Encounter: Payer: Self-pay | Admitting: Student in an Organized Health Care Education/Training Program

## 2023-06-16 ENCOUNTER — Ambulatory Visit
Admission: RE | Admit: 2023-06-16 | Discharge: 2023-06-16 | Disposition: A | Discharge status: Home / Self Care | Payer: 59 | Source: Ambulatory Visit | Attending: Student in an Organized Health Care Education/Training Program | Admitting: Student in an Organized Health Care Education/Training Program

## 2023-06-16 ENCOUNTER — Ambulatory Visit (INDEPENDENT_AMBULATORY_CARE_PROVIDER_SITE_OTHER): Payer: 59 | Admitting: Student in an Organized Health Care Education/Training Program

## 2023-06-16 VITALS — BP 128/78 | HR 75 | Temp 97.9°F | Ht 59.0 in | Wt 140.2 lb

## 2023-06-16 DIAGNOSIS — J441 Chronic obstructive pulmonary disease with (acute) exacerbation: Secondary | ICD-10-CM | POA: Diagnosis not present

## 2023-06-16 DIAGNOSIS — F17210 Nicotine dependence, cigarettes, uncomplicated: Secondary | ICD-10-CM | POA: Diagnosis not present

## 2023-06-16 DIAGNOSIS — J302 Other seasonal allergic rhinitis: Secondary | ICD-10-CM

## 2023-06-16 DIAGNOSIS — J0110 Acute frontal sinusitis, unspecified: Secondary | ICD-10-CM

## 2023-06-16 MED ORDER — NICOTINE POLACRILEX 2 MG MT LOZG
2.0000 mg | LOZENGE | OROMUCOSAL | 6 refills | Status: DC | PRN
Start: 2023-06-16 — End: 2023-08-08

## 2023-06-16 MED ORDER — AEROCHAMBER MV MISC
0 refills | Status: AC
Start: 2023-06-16 — End: ?

## 2023-06-16 NOTE — Telephone Encounter (Signed)
Spoke to [employer]. She is going to contact patient and offer appt for today at 11:00a.

## 2023-06-16 NOTE — Patient Instructions (Signed)
[facility] [place] Quit[facility]: Call 1-800-QUIT-NOW (1-800-784-8669). The [place] [facility]uitline is a free service for [place] residents. Trained counselors are available from 8 am until 3 am, 365 days per year. Services are available in both English and Spanish.   Web Resources Free online support programs can help you track your progress and share experiences with others who are quitting. These are examples: www.becomeanex.org www.trytostop.org  www.smokefree.gov  www.everydayhealth.com/smoking-cessation/index.aspx  [facility]: offers comprehensive in-person tobacco treatment counseling at [facility] building ([address] [place] [address]).  Open to everyone. Virtual appointments available. Free parking. Call [phone] to schedule an appointment or [phone] for general information.    Tobacco Cessation Medications  Nicotine Replacement Therapy (NRT)  Nicotine is the addictive part of tobacco smoke, but not the most dangerous part. There are 7000 other toxins in cigarettes, including carbon monoxide, that cause disease. People do not generally become addicted to medication. Common problems: People don't use enough medication or stop too early. Medications are safe and effective. Overdose is very uncommon. Use medications as long as needed (3 months minimum). Some combinations work better than single medications. Long acting medications like the NRT patch and bupropion provide continuous treatment for withdrawal symptoms.  PLUS  Short acting medications like the NRT gum, lozenge, inhaler, and nasal spray help people to cope with breakthrough cravings.  ? Nicotine Patch  Place patch on hairless skin on upper body, including arms and back. Each day: discard old patch, shower, apply new patch to a different site. Apply hydrocortisone cream to mildly red/irritated areas. Call provider if rash develops. If patch causes sleep disturbance, remove patch  at bedtime and replace each morning after shower. Side effects may include: skin irritation, headache, insomnia, abnormal/vivid dreams.  ? Nicotine Gum  Chew gum slowly, park in cheek when peppery taste or tingling sensation begins (about 15-30 chews). When taste or tingling goes away, begin chewing again. Use until nicotine is gone (taste or tingle does not return, usually 30 minutes). Park in different areas of mouth. Nicotine is absorbed through the lining of the mouth. Use enough to control cravings, up to 24 pieces per day (if used alone). Avoid eating or drinking for 15 minutes before using and during use. Side effects may include: mouth/jaw soreness, hiccups, indigestion, hypersalivation.  If gum is not chewed correctly, additional side effects may include lightheadedness, nausea/vomiting, throat and mouth irritation.  ? Nicotine Lozenge  Allow to dissolve slowly in mouth (20-30 minutes). Do not chew or swallow. Nicotine release may cause a warm tingling sensation. Occasionally rotate to different areas of the mouth. Use enough to control cravings, up to 20 lozenges per day (if used alone). Avoid eating or drinking for 15 minutes before using and during use. Side effects may include: nausea, hiccups, cough, heartburn, headache, gas, insomnia.  ? Nicotine Nasal Spray Use 1 spray in each nostril (1 dose) and tilt head back for 1 minute. Do not sniff, swallow, or inhale through nose.  Use at least 8 doses (1 spray in each nostril) , up to 40 doses per day (if used alone). To reduce nasal irritation, spray on cotton swab and insert into nose. Side effects may include: nasal and/or throat irritation (hot, peppery, or burning sensation), nasal irritation, tearing, sneezing, cough, headache.  ? Nicotine Oral Inhaler (puffer) Inhale into the back of the throat or puff in short breaths. Do not inhale into the lungs.  Puff continuously for 20 minutes (about 80 puffs) until cartridge   is  empty. Change cartridge when it loses the "burning in throat" sensation (feels like air only). Open cartridges can be saved and used again within 24 hours. Use at least 6 and up to 16 cartridges per day (if used alone).  Avoid eating or drinking for 15 minutes before using and during use. Side effects may include: mouth and/or throat irritation, unpleasant taste, cough, nasal irritation, indigestion, hiccups, headache.  ? Chantix (varenicline) Days 1-3: Take one 0.5 mg white pill each morning for 3 days, one week before quit date. Days 4-7: Increase to one 0.5 mg white pill twice a day in morning and evening for 4 days.  On Day 8 (target quit date), increase to one 1 mg blue pill twice a day. Maintain this dose for a minimum of 3 months. Take with food and a full glass of water to reduce nausea. Be sure that the two doses are at least 8 hours apart, but try to take second dose early in the evening (i.e. 6 pm) to avoid sleep problems. Common side effects include: nausea, insomnia, headache, abnormal/vivid dreams. Tell your doctor if you have any history of psychiatric illness prior to starting Chantix.  STOP taking CHANTIX and contact a healthcare provider immediately if you experience agitation, hostility, depressed mood, changes in thoughts or behavior that are not typical for you, thinking about or attempting suicide, allergic or skin reactions including swelling, rash, redness, or peeling of the skin.  For patients who have heart disease: Smoking is a major risk factor for cardiovascular disease, and Chantix can help you quit smoking. Chantix may be associated with a small, increased risk of certain heart events in patients who have heart disease. If you have any new or worsening symptoms of heart disease while taking Chantix, such as shortness of breath or trouble breathing, new or worsening chest pain, or new or worsening pain in your legs when walking, call your doctor or get emergency medical  help immediately.  ? Wellbutrin / Zyban (bupropion) Take one 150 mg pill each morning for 3 days, one week before target quit date. On Day 4, increase to one 150 mg pill twice a day, morning and evening.  Maintain this dose for a minimum of 3 months. Be sure that the two doses are at least 8 hours apart, but try to take second dose early in the evening (i.e. 6 pm) to avoid sleep problems. Avoid or minimize use of alcohol when taking this medication. Common side effects include: dry mouth, headache, insomnia, nausea, weight loss.  Risk of seizure is 12/998. STOP taking BUPROPION and contact a healthcare provider immediately if you experience agitation, hostility, depressed mood, changes in thoughts or behavior that are not typical for you, thinking about or attempting suicide, allergic or skin reactions including swelling, rash, redness, or peeling of the skin.  

## 2023-06-16 NOTE — Telephone Encounter (Signed)
Pt is coming in at 11 nothing further neded

## 2023-06-16 NOTE — Progress Notes (Signed)
Synopsis: Referred in for COPD by Karie Schwalbe, MD  Assessment & Plan:   1. COPD  Presenting for the evaluation of shortness of breath in the setting of history of COPD. On exam, she is wheezing and endorses shortness of breath. Peak eosinophil count was 600 in 2011. No PFT's to assess degree of obstruction. Suspect COPD vs COPD asthma overlap, and patient is currently on appropriate triple therapy. Other etiologies on the differential for her symptoms include respiratory bronchiolitis, RB-ILD, and vape induced lung injury.  I have counseled the patient extensively on the importance of smoking cessation. I have explained to her that her without smoking and vape cessation, her lung condition will remain uncontrolled.  I have recommended she continue with triple therapy that is optimized for her. I will obtain a pulmonary function test as well as order a chest xray today. Finally, I did go over inhaler technique with the patient and she is using the Symbicort incorrectly. Will provide inhaler training and send a spacer device.  - DG Chest 2 View; Future - Spacer/Aero-Holding Chambers (AEROCHAMBER MV) inhaler; Use as instructed  Dispense: 1 each; Refill: 0 - Pulmonary Function Test [facility] Only; Future  2. Moderate smoker (20 or less per day)  Counseled patient extensively re smoking cessation. Will send nicotine lozenges to help with effort.  - nicotine polacrilex (NICOTINE MINI) 2 MG lozenge; Take 1 lozenge (2 mg total) by mouth every 2 (two) hours as needed for smoking cessation.  Dispense: 96 lozenge; Refill: 6  Return in about 3 months (around 09/16/2023).  I spent 45 minutes caring for this patient today, including preparing to see the patient, obtaining a medical history , reviewing a separately obtained history, performing a medically appropriate examination and/or evaluation, counseling and educating the patient/family/caregiver, ordering medications, tests, or procedures, and  documenting clinical information in the electronic health record. Spent 5 minutes providing smoking cessation counseling.  [name], MD [facility] 06/16/2023 11:48 AM    End of visit medications:  Meds ordered this encounter  Medications   nicotine polacrilex (NICOTINE MINI) 2 MG lozenge    Sig: Take 1 lozenge (2 mg total) by mouth every 2 (two) hours as needed for smoking cessation.    Dispense:  96 lozenge    Refill:  6   Spacer/Aero-Holding Chambers (AEROCHAMBER MV) inhaler    Sig: Use as instructed    Dispense:  1 each    Refill:  0     Current Outpatient Medications:    albuterol (VENTOLIN HFA) 108 (90 Base) MCG/ACT inhaler, Inhale 2 puffs into the lungs every 6 (six) hours as needed for wheezing or shortness of breath., Disp: 18 g, Rfl: 0   budesonide-formoterol (SYMBICORT) 160-4.5 MCG/ACT inhaler, Inhale 2 puffs into the lungs in the morning and at bedtime. Bid, Disp: 1 each, Rfl: 12   buPROPion (WELLBUTRIN XL) 150 MG 24 hr tablet, Take 150 mg by mouth every morning., Disp: , Rfl:    busPIRone (BUSPAR) 7.5 MG tablet, Take 1 tablet by mouth daily., Disp: , Rfl:    Cholecalciferol 25 MCG (1000 UT) capsule, Take 1,000 Units by mouth daily., Disp: , Rfl:    clonazePAM (KLONOPIN) 0.5 MG tablet, Take 0.5 mg by mouth daily as needed., Disp: , Rfl:    Diphenhyd-Hydrocort-Nystatin (FIRST-DUKES MOUTHWASH MT), 5 mLs in the morning, at noon, and at bedtime., Disp: , Rfl:    doxycycline (VIBRA-TABS) 100 MG tablet, Take 1 tablet (100 mg total) by mouth  2 (two) times daily., Disp: 14 tablet, Rfl: 0   fluticasone (FLONASE) 50 MCG/ACT nasal spray, SHAKE LIQUID AND USE 2 SPRAYS IN EACH NOSTRIL DAILY, Disp: 16 g, Rfl: 6   hydrOXYzine (ATARAX/VISTARIL) 50 MG tablet, Take 1 tablet (50 mg total) by mouth 3 (three) times daily as needed for anxiety., Disp: 60 tablet, Rfl: 0   ipratropium-albuterol (DUONEB) 0.5-2.5 (3) MG/3ML SOLN, USE 3 ML VIA NEBULIZER EVERY 4 HOURS AS  NEEDED FOR WHEEZING OR COUGH, Disp: 360 mL, Rfl: 0   levothyroxine (SYNTHROID) 88 MCG tablet, TAKE 1 TABLET(88 MCG) BY MOUTH DAILY AT 6 AM, Disp: 90 tablet, Rfl: 2   magic mouthwash (nystatin, lidocaine, diphenhydrAMINE, alum & mag hydroxide) suspension, Swish and swallow 5 mLs 4 (four) times daily as needed for mouth pain., Disp: 180 mL, Rfl: 1   mirtazapine (REMERON) 30 MG tablet, Take 30 mg by mouth at bedtime., Disp: , Rfl:    Multiple Vitamins-Minerals (MULTIVITAMIN ADULTS PO), Take by mouth., Disp: , Rfl:    naloxone (NARCAN) nasal spray 4 mg/0.1 mL, Place 1 spray into the nose once., Disp: , Rfl:    nicotine polacrilex (NICOTINE MINI) 2 MG lozenge, Take 1 lozenge (2 mg total) by mouth every 2 (two) hours as needed for smoking cessation., Disp: 96 lozenge, Rfl: 6   OLANZapine (ZYPREXA) 15 MG tablet, Take by mouth., Disp: , Rfl:    Omega-3 Fatty Acids (FISH OIL) 1000 MG CAPS, Take by mouth daily., Disp: , Rfl:    omeprazole (PRILOSEC) 40 MG capsule, TAKE 1 CAPSULE(40 MG) BY MOUTH DAILY, Disp: 90 capsule, Rfl: 3   ondansetron (ZOFRAN-ODT) 8 MG disintegrating tablet, Take 8 mg by mouth 2 (two) times daily., Disp: , Rfl:    Oxybutynin Chloride (GELNIQUE) 10 % GEL, Place onto the skin., Disp: , Rfl:    Oxycodone HCl 10 MG TABS, Take 10 mg by mouth every 6 (six) hours as needed for pain., Disp: , Rfl:    predniSONE (DELTASONE) 20 MG tablet, 2 tabs a day for 5 days, 1 tab a day for 5 days, 0.5 tab a day for 10 days.  Don't take with aleve or ibuprofen.  Take with food., Disp: 20 tablet, Rfl: 0   Spacer/Aero-Holding Chambers (AEROCHAMBER MV) inhaler, Use as instructed, Disp: 1 each, Rfl: 0   Tiotropium Bromide Monohydrate (SPIRIVA RESPIMAT) 2.5 MCG/ACT AERS, Inhale 2 puffs into the lungs daily., Disp: 1 each, Rfl: 2   tiZANidine (ZANAFLEX) 4 MG tablet, Take by mouth., Disp: , Rfl:    triamcinolone cream (KENALOG) 0.1 %, Apply 1 Application topically 2 (two) times daily., Disp: 30 g, Rfl: 1    valACYclovir (VALTREX) 500 MG tablet, Take 1 tablet (500 mg total) by mouth daily., Disp: 90 tablet, Rfl: 1   Varenicline Tartrate, Starter, (CHANTIX STARTING MONTH PAK) 0.5 MG X 11 & 1 MG X 42 TBPK, Take 0.5 mg once daily for 3 days, then 0.5 mg twice a day for 4 days, then 1 mg twice a day thereafter, Disp: 42 each, Rfl: 0   XTAMPZA ER 9 MG C12A, Take 1 capsule by mouth at bedtime., Disp: , Rfl:    Subjective:   PATIENT ID: Leslie Wong GENDER: female DOB: [birth year], MRN: [id number]  Chief Complaint  Patient presents with   Acute Visit    COPD. SOB with exertion, dry cough and wheezing.     HPI  Patient is a 67 year old female presenting to clinic for the evaluation of shortness of breath.  Patient reports exertional dyspnea, cough, and a wheeze. She was previously followed in our clinic, lost to follow up for two years, and re-established care with Dr. Judeth Horn last month at our [facility] office. She tells me transportation to [place] is going to be a problem and is wondering if she can follow up here.  Symptoms have been chronic, but she reports a recent exacerbation in her symptoms. She's had a cough, wheeze, sputum production, and shortness of breath. Dyspnea is at rest as well as with exertion. During her visit with Dr. Judeth Horn, smoking cessation was stressed, and she was prescribed Varenicline. She was also switched to Budesonide-Formoterol (160-4.5) and Tiotropium Bromide (5 mcg, respimat delivery). She is using her Chantix, but continues to smoke half a pack a day. She is actively vaping as well (she had a vape device with her in clinic today). Furthermore, patient was seen yesterday at her PCP's office and doxycycline was prescribed. She finished the course of prednisone prescribed during her last visit.  Patient also reports having lived in an apartment that had mold. Independent testing confirmed mold in the apartment. She reports feeling very ill while living  there. The apartment reportedly was remediated, and she is back there.  Reviewed most recent chest imaging CT lung cancer screening in 2022 that demonstrated scattered endobronchial nodules most in the right upper lobe suspicious for respiratory bronchiolitis vs smoking related lung disease.   Patient continues to smoke, and is currently at 10 packs a day. She is also actively using her vape.   Ancillary information including prior medications, full medical/surgical/family/social histories, and PFTs (when available) are listed below and have been reviewed.   Review of Systems  Constitutional:  Negative for chills, fever, malaise/fatigue and weight loss.  Respiratory:  Positive for cough, shortness of breath and wheezing. Negative for sputum production.   Cardiovascular:  Negative for chest pain.     Objective:   Vitals:   06/16/23 1059  BP: 128/78  Pulse: 75  Temp: 97.9 F (36.6 C)  TempSrc: Temporal  SpO2: 96%  Weight: 140 lb 3.2 oz (63.6 kg)  Height: 4\' 11"  (1.499 m)   96% on RA  BMI Readings from Last 3 Encounters:  06/16/23 28.32 kg/m  06/15/23 28.28 kg/m  05/25/23 27.47 kg/m   Wt Readings from Last 3 Encounters:  06/16/23 140 lb 3.2 oz (63.6 kg)  06/15/23 140 lb (63.5 kg)  05/25/23 136 lb (61.7 kg)    Physical Exam Constitutional:      Appearance: Normal appearance. She is obese. She is not ill-appearing.  Cardiovascular:     Rate and Rhythm: Normal rate and regular rhythm.     Pulses: Normal pulses.     Heart sounds: Normal heart sounds.  Pulmonary:     Breath sounds: Wheezing (diffuse, end expiratory) present.  Neurological:     General: No focal deficit present.     Mental Status: She is alert.       Ancillary Information    Past Medical History:  Diagnosis Date   Back pain    Cancer (HCC)    skin   COPD (chronic obstructive pulmonary disease) (HCC)    Depression    BAD   Gastroparesis    GERD (gastroesophageal reflux disease)    HOH  (hard of hearing)    Hypothyroidism    Insomnia    Migraines    Motion sickness    Parkinson disease    per [facility], dx'd 2019   Recurrent  cold sores    Shoulder bursitis    Urge incontinence    Wears dentures    full upper and lower     Family History  Problem Relation Age of Onset   Hypertension Mother    Arthritis Mother    Diabetes Mother    Stroke Mother    Cancer Father        lung   Colon cancer Neg Hx    Breast cancer Neg Hx      Past Surgical History:  Procedure Laterality Date   ABDOMINAL HYSTERECTOMY  12/26/1997   total   APPENDECTOMY     CARPAL TUNNEL RELEASE Right    and tendon repair   CATARACT EXTRACTION W/PHACO Left 10/04/2021   Procedure: CATARACT EXTRACTION PHACO AND INTRAOCULAR LENS PLACEMENT (IOC) LEFT 9.46 00:48.0;  Surgeon: Nevada Crane, MD;  Location: [facility];  Service: Ophthalmology;  Laterality: Left;  requests early   CATARACT EXTRACTION W/PHACO Right 11/08/2021   Procedure: CATARACT EXTRACTION PHACO AND INTRAOCULAR LENS PLACEMENT (IOC) RIGHT;  Surgeon: Nevada Crane, MD;  Location: [facility];  Service: Ophthalmology;  Laterality: Right;  2.37 00:26.0   COLONOSCOPY WITH PROPOFOL N/A 09/07/2022   Procedure: COLONOSCOPY WITH PROPOFOL;  Surgeon: Wyline Mood, MD;  Location: [facility];  Service: Gastroenterology;  Laterality: N/A;   ESOPHAGOGASTRODUODENOSCOPY  01/26/2006   negative except small hiatal hernia   ESOPHAGOGASTRODUODENOSCOPY  02/24/2015   See report   ESOPHAGOGASTRODUODENOSCOPY N/A 09/07/2022   Procedure: ESOPHAGOGASTRODUODENOSCOPY (EGD);  Surgeon: Wyline Mood, MD;  Location: [facility] END[facility];  Service: Gastroenterology;  Laterality: N/A;   LAPAROSCOPY     for endometriosis   OVARIAN CYST REMOVAL  12/26/1988   unilateral   TONSILLECTOMY AND ADENOIDECTOMY      Social History   Socioeconomic History   Marital status: Single    Spouse name: Not on file   Number of children: Not on file    Years of education: Not on file   Highest education level: Not on file  Occupational History   Not on file  Tobacco Use   Smoking status: Every Day    Packs/day: 2.00    Years: 44.00    Additional pack years: 0.00    Total pack years: 88.00    Types: Cigarettes, E-cigarettes   Smokeless tobacco: Former   Tobacco comments:    0.5PPD 06/16/2023  Vaping Use   Vaping Use: Some days  Substance and Sexual Activity   Alcohol use: No    Alcohol/week: 0.0 standard drinks of alcohol   Drug use: No   Sexual activity: Never  Other Topics Concern   Not on file  Social History Narrative   Lives alone.     Has a dog names Dixie   Social Determinants of Health   Financial Resource Strain: Low Risk  (05/11/2022)   Overall Financial Resource Strain (CARDIA)    Difficulty of Paying Living Expenses: Not hard at all  Food Insecurity: No Food Insecurity (05/11/2022)   Hunger Vital Sign    Worried About Running Out of Food in the Last Year: Never true    Ran Out of Food in the Last Year: Never true  Transportation Needs: No Transportation Needs (05/11/2022)   PRAPARE - Administrator, Civil Service (Medical): No    Lack of Transportation (Non-Medical): No  Physical Activity: Insufficiently Active (05/11/2022)   Exercise Vital Sign    Days of Exercise per Week: 2 days    Minutes of Exercise  per Session: 20 min  Stress: No Stress Concern Present (05/11/2022)   [employer] of Occupational Health - Occupational Stress Questionnaire    Feeling of Stress : Not at all  Social Connections: Socially Isolated (05/11/2022)   Social Connection and Isolation Panel [NHANES]    Frequency of Communication with Friends and Family: More than three times a week    Frequency of Social Gatherings with Friends and Family: More than three times a week    Attends Religious Services: Never    Database administrator or Organizations: No    Attends Banker Meetings: Never    Marital  Status: Divorced  Catering manager Violence: Not At Risk (05/11/2022)   Humiliation, Afraid, Rape, and Kick questionnaire    Fear of Current or Ex-Partner: No    Emotionally Abused: No    Physically Abused: No    Sexually Abused: No     Allergies  Allergen Reactions   Doxepin Other (See Comments)    Pt reports "that made me have seizures"   Valproic Acid Shortness Of Breath   Amitriptyline     Burning sensation in mouth   Aspirin     REACTION: UNSPECIFIED   Atorvastatin Other (See Comments)    Tongue swelling, legs cramping   Baclofen     REACTION: Throat swells up   Buprenorphine     Vomiting, intolerant   Duloxetine Other (See Comments)    Vision loss, weight loss   Erythromycin     REACTION: UNSPECIFIED   Gabapentin     REACTION: throat swells up   Metoclopramide Hcl     REACTION: states "messed me up"   Nortriptyline     Other reaction(s): Other (See Comments) Vision issues   Nsaids Other (See Comments)    Stomach problems   Nucynta Er [Tapentadol Hcl Er]     Intolerant, see note from 07/24/12.     Nucynta [Tapentadol]     AMS   Prednisone     GI intolerance with higher dose.  Can tolerate 5mg  per day.     Pregabalin    Seroquel [Quetiapine Fumerate] Other (See Comments)    Loss control, felt like on another planet   Sulfa Antibiotics Nausea And Vomiting   Topamax Other (See Comments)    Blisters in mouth and tongue   Vicodin [Hydrocodone-Acetaminophen]     Nausea, thrush and constipation     CBC    Component Value Date/Time   WBC 10.4 10/14/2022 1224   RBC 4.99 10/14/2022 1224   HGB 14.7 10/14/2022 1224   HGB 14.1 01/27/2013 0213   HCT 44.8 10/14/2022 1224   HCT 42.5 01/27/2013 0213   PLT 238.0 10/14/2022 1224   PLT 347 01/27/2013 0213   MCV 89.9 10/14/2022 1224   MCV 98 01/27/2013 0213   MCH 31.5 05/29/2021 0437   MCHC 32.7 10/14/2022 1224   RDW 14.9 10/14/2022 1224   RDW 13.8 01/27/2013 0213   LYMPHSABS 2.5 10/14/2022 1224   LYMPHSABS 3.8  (H) 01/27/2013 0213   MONOABS 0.7 10/14/2022 1224   MONOABS 0.9 01/27/2013 0213   EOSABS 0.1 10/14/2022 1224   EOSABS 0.4 01/27/2013 0213   BASOSABS 0.1 10/14/2022 1224   BASOSABS 0.1 01/27/2013 0213    Pulmonary Functions Testing Results:     No data to display          Outpatient Medications Prior to Visit  Medication Sig Dispense Refill   albuterol (VENTOLIN HFA) 108 (90 Base) MCG/ACT  inhaler Inhale 2 puffs into the lungs every 6 (six) hours as needed for wheezing or shortness of breath. 18 g 0   budesonide-formoterol (SYMBICORT) 160-4.5 MCG/ACT inhaler Inhale 2 puffs into the lungs in the morning and at bedtime. Bid 1 each 12   buPROPion (WELLBUTRIN XL) 150 MG 24 hr tablet Take 150 mg by mouth every morning.     busPIRone (BUSPAR) 7.5 MG tablet Take 1 tablet by mouth daily.     Cholecalciferol 25 MCG (1000 UT) capsule Take 1,000 Units by mouth daily.     clonazePAM (KLONOPIN) 0.5 MG tablet Take 0.5 mg by mouth daily as needed.     Diphenhyd-Hydrocort-Nystatin (FIRST-DUKES MOUTHWASH MT) 5 mLs in the morning, at noon, and at bedtime.     doxycycline (VIBRA-TABS) 100 MG tablet Take 1 tablet (100 mg total) by mouth 2 (two) times daily. 14 tablet 0   fluticasone (FLONASE) 50 MCG/ACT nasal spray SHAKE LIQUID AND USE 2 SPRAYS IN EACH NOSTRIL DAILY 16 g 6   hydrOXYzine (ATARAX/VISTARIL) 50 MG tablet Take 1 tablet (50 mg total) by mouth 3 (three) times daily as needed for anxiety. 60 tablet 0   ipratropium-albuterol (DUONEB) 0.5-2.5 (3) MG/3ML SOLN USE 3 ML VIA NEBULIZER EVERY 4 HOURS AS NEEDED FOR WHEEZING OR COUGH 360 mL 0   levothyroxine (SYNTHROID) 88 MCG tablet TAKE 1 TABLET(88 MCG) BY MOUTH DAILY AT 6 AM 90 tablet 2   magic mouthwash (nystatin, lidocaine, diphenhydrAMINE, alum & mag hydroxide) suspension Swish and swallow 5 mLs 4 (four) times daily as needed for mouth pain. 180 mL 1   mirtazapine (REMERON) 30 MG tablet Take 30 mg by mouth at bedtime.     Multiple Vitamins-Minerals  (MULTIVITAMIN ADULTS PO) Take by mouth.     naloxone (NARCAN) nasal spray 4 mg/0.1 mL Place 1 spray into the nose once.     OLANZapine (ZYPREXA) 15 MG tablet Take by mouth.     Omega-3 Fatty Acids (FISH OIL) 1000 MG CAPS Take by mouth daily.     omeprazole (PRILOSEC) 40 MG capsule TAKE 1 CAPSULE(40 MG) BY MOUTH DAILY 90 capsule 3   ondansetron (ZOFRAN-ODT) 8 MG disintegrating tablet Take 8 mg by mouth 2 (two) times daily.     Oxybutynin Chloride (GELNIQUE) 10 % GEL Place onto the skin.     Oxycodone HCl 10 MG TABS Take 10 mg by mouth every 6 (six) hours as needed for pain.     predniSONE (DELTASONE) 20 MG tablet 2 tabs a day for 5 days, 1 tab a day for 5 days, 0.5 tab a day for 10 days.  Don't take with aleve or ibuprofen.  Take with food. 20 tablet 0   Tiotropium Bromide Monohydrate (SPIRIVA RESPIMAT) 2.5 MCG/ACT AERS Inhale 2 puffs into the lungs daily. 1 each 2   tiZANidine (ZANAFLEX) 4 MG tablet Take by mouth.     triamcinolone cream (KENALOG) 0.1 % Apply 1 Application topically 2 (two) times daily. 30 g 1   valACYclovir (VALTREX) 500 MG tablet Take 1 tablet (500 mg total) by mouth daily. 90 tablet 1   Varenicline Tartrate, Starter, (CHANTIX STARTING MONTH PAK) 0.5 MG X 11 & 1 MG X 42 TBPK Take 0.5 mg once daily for 3 days, then 0.5 mg twice a day for 4 days, then 1 mg twice a day thereafter 42 each 0   XTAMPZA ER 9 MG C12A Take 1 capsule by mouth at bedtime.     No facility-administered medications prior to  visit.

## 2023-06-26 ENCOUNTER — Encounter: Payer: 59 | Admitting: Family Medicine

## 2023-06-27 ENCOUNTER — Other Ambulatory Visit: Payer: Self-pay | Admitting: Internal Medicine

## 2023-07-07 ENCOUNTER — Other Ambulatory Visit: Payer: Self-pay | Admitting: Family Medicine

## 2023-07-07 NOTE — Telephone Encounter (Signed)
Prescription Request  07/07/2023  LOV: 05/25/2023  What is the name of the medication or equipment? Varenicline Tartrate, Starter, (CHANTIX STARTING MONTH PAK) 0.5 MG X 11 & 1 MG X 42 TBPK   (This medication was not filled by her PCP, however, patient is wanting Dr. Para March to refill it if possible Have you contacted your pharmacy to request a refill? No   Which pharmacy would you like this sent to?  [id number] [place], [place] - [address] [place] [address] AT [facility] [address] [place] [address] Phone: [phone] Fax: [phone]    Patient notified that their request is being sent to the clinical staff for review and that they should receive a response within 2 business days.   Please advise at [facility] [phone]

## 2023-07-07 NOTE — Telephone Encounter (Deleted)
Prescription Request  07/07/2023  LOV: 05/25/2023  What is the name of the medication or equipment? Varenicline Tartrate, Starter, (CHANTIX STARTING MONTH PAK) 0.5 MG X 11 & 1 MG X 42 TBPK   (This medication was not filled by her PCP, however, patient is wanting Dr. Para March to refill it if possible Have you contacted your pharmacy to request a refill? No   Which pharmacy would you like this sent to?  [id number] [place], [place] - [address] AT [facility] [address] [place] [address] Phone: [phone] Fax: [phone]    Patient notified that their request is being sent to the clinical staff for review and that they should receive a response within 2 business days.   Please advise at [facility] [phone]

## 2023-07-09 NOTE — Addendum Note (Signed)
Addended by: Joaquim Nam on: 07/09/2023 06:32 PM   Modules accepted: Orders

## 2023-07-09 NOTE — Telephone Encounter (Signed)
Did she tolerate it previously without any adverse effects/mood changes?  Please let me know.  If so I can sign the prescription for the initial month and also for the continuation pack.  Thanks.

## 2023-07-10 NOTE — Telephone Encounter (Signed)
Patient stated she was able to tolerate it fine

## 2023-07-11 ENCOUNTER — Other Ambulatory Visit: Payer: Self-pay | Admitting: Family Medicine

## 2023-07-11 MED ORDER — VARENICLINE TARTRATE (STARTER) 0.5 MG X 11 & 1 MG X 42 PO TBPK: ORAL_TABLET | ORAL | 0 refills | Status: DC

## 2023-07-11 MED ORDER — VARENICLINE TARTRATE 1 MG PO TABS
1.0000 mg | ORAL_TABLET | Freq: Two times a day (BID) | ORAL | 1 refills | Status: DC
Start: 2023-07-11 — End: 2023-10-09

## 2023-07-11 NOTE — Addendum Note (Signed)
Addended by: Joaquim Nam on: 07/11/2023 08:00 AM   Modules accepted: Orders

## 2023-07-11 NOTE — Telephone Encounter (Signed)
Sent. Thanks.   

## 2023-07-26 ENCOUNTER — Encounter (INDEPENDENT_AMBULATORY_CARE_PROVIDER_SITE_OTHER): Payer: Self-pay

## 2023-08-08 ENCOUNTER — Ambulatory Visit (INDEPENDENT_AMBULATORY_CARE_PROVIDER_SITE_OTHER): Payer: 59 | Admitting: Family Medicine

## 2023-08-08 ENCOUNTER — Encounter: Payer: Self-pay | Admitting: Family Medicine

## 2023-08-08 VITALS — BP 110/68 | HR 91 | Temp 97.0°F | Ht 59.0 in | Wt 142.0 lb

## 2023-08-08 DIAGNOSIS — F17200 Nicotine dependence, unspecified, uncomplicated: Secondary | ICD-10-CM

## 2023-08-08 DIAGNOSIS — J449 Chronic obstructive pulmonary disease, unspecified: Secondary | ICD-10-CM

## 2023-08-08 DIAGNOSIS — Z1231 Encounter for screening mammogram for malignant neoplasm of breast: Secondary | ICD-10-CM

## 2023-08-08 DIAGNOSIS — Z0000 Encounter for general adult medical examination without abnormal findings: Secondary | ICD-10-CM | POA: Diagnosis not present

## 2023-08-08 DIAGNOSIS — R234 Changes in skin texture: Secondary | ICD-10-CM

## 2023-08-08 DIAGNOSIS — E039 Hypothyroidism, unspecified: Secondary | ICD-10-CM | POA: Diagnosis not present

## 2023-08-08 DIAGNOSIS — R739 Hyperglycemia, unspecified: Secondary | ICD-10-CM | POA: Diagnosis not present

## 2023-08-08 DIAGNOSIS — F319 Bipolar disorder, unspecified: Secondary | ICD-10-CM

## 2023-08-08 DIAGNOSIS — Z7189 Other specified counseling: Secondary | ICD-10-CM

## 2023-08-08 DIAGNOSIS — G8929 Other chronic pain: Secondary | ICD-10-CM

## 2023-08-08 DIAGNOSIS — R682 Dry mouth, unspecified: Secondary | ICD-10-CM

## 2023-08-08 LAB — MICROALBUMIN / CREATININE URINE RATIO
Creatinine,U: 168.8 mg/dL
Microalb Creat Ratio: 5.2 mg/g (ref 0.0–30.0)
Microalb, Ur: 8.8 mg/dL — ABNORMAL HIGH (ref 0.0–1.9)

## 2023-08-08 LAB — COMPREHENSIVE METABOLIC PANEL
ALT: 19 U/L (ref 0–35)
AST: 19 U/L (ref 0–37)
Albumin: 4.1 g/dL (ref 3.5–5.2)
Alkaline Phosphatase: 96 U/L (ref 39–117)
BUN: 9 mg/dL (ref 6–23)
CO2: 29 mEq/L (ref 19–32)
Calcium: 10.5 mg/dL (ref 8.4–10.5)
Chloride: 102 mEq/L (ref 96–112)
Creatinine, Ser: 0.94 mg/dL (ref 0.40–1.20)
GFR: 64.05 mL/min (ref >60.00)
Glucose, Bld: 129 mg/dL — ABNORMAL HIGH (ref 70–99)
Potassium: 3.6 mEq/L (ref 3.5–5.1)
Sodium: 142 mEq/L (ref 135–145)
Total Bilirubin: 0.3 mg/dL (ref 0.2–1.2)
Total Protein: 6.6 g/dL (ref 6.0–8.3)

## 2023-08-08 LAB — CBC WITH DIFFERENTIAL/PLATELET
Basophils Absolute: 0.1 10*3/uL (ref 0.0–0.1)
Basophils Relative: 0.7 % (ref 0.0–3.0)
Eosinophils Absolute: 0.1 10*3/uL (ref 0.0–0.7)
Eosinophils Relative: 1.1 % (ref 0.0–5.0)
HCT: 49.4 % — ABNORMAL HIGH (ref 36.0–46.0)
Hemoglobin: 16.4 g/dL — ABNORMAL HIGH (ref 12.0–15.0)
Lymphocytes Relative: 25.8 % (ref 12.0–46.0)
Lymphs Abs: 3.2 10*3/uL (ref 0.7–4.0)
MCHC: 33.1 g/dL (ref 30.0–36.0)
MCV: 97.1 fl (ref 78.0–100.0)
Monocytes Absolute: 1 10*3/uL (ref 0.1–1.0)
Monocytes Relative: 8.1 % (ref 3.0–12.0)
Neutro Abs: 8.1 10*3/uL — ABNORMAL HIGH (ref 1.4–7.7)
Neutrophils Relative %: 64.3 % (ref 43.0–77.0)
Platelets: 243 10*3/uL (ref 150.0–400.0)
RBC: 5.09 Mil/uL (ref 3.87–5.11)
RDW: 15 % (ref 11.5–15.5)
WBC: 12.5 10*3/uL — ABNORMAL HIGH (ref 4.0–10.5)

## 2023-08-08 LAB — TSH: TSH: 2.91 u[IU]/mL (ref 0.35–5.50)

## 2023-08-08 LAB — VITAMIN D 25 HYDROXY (VIT D DEFICIENCY, FRACTURES): VITD: 29.65 ng/mL — ABNORMAL LOW (ref 30.00–100.00)

## 2023-08-08 LAB — LIPID PANEL
Cholesterol: 234 mg/dL — ABNORMAL HIGH (ref 0–200)
HDL: 28.9 mg/dL — ABNORMAL LOW (ref >39.00)
NonHDL: 205.43
Total CHOL/HDL Ratio: 8
Triglycerides: 377 mg/dL — ABNORMAL HIGH (ref 0.0–149.0)
VLDL: 75.4 mg/dL — ABNORMAL HIGH (ref 0.0–40.0)

## 2023-08-08 LAB — HEMOGLOBIN A1C: Hgb A1c MFr Bld: 7.5 % — ABNORMAL HIGH (ref 4.6–6.5)

## 2023-08-08 LAB — LDL CHOLESTEROL, DIRECT: Direct LDL: 159 mg/dL

## 2023-08-08 MED ORDER — TRIAMCINOLONE ACETONIDE 0.1 % EX CREA: 1.0000 | TOPICAL_CREAM | Freq: Two times a day (BID) | CUTANEOUS | 1 refills | Status: AC | PRN

## 2023-08-08 MED ORDER — VALACYCLOVIR HCL 500 MG PO TABS: 500.0000 mg | ORAL_TABLET | Freq: Every day | ORAL | 3 refills | Status: DC

## 2023-08-08 NOTE — Patient Instructions (Addendum)
Go to the lab on the way out.   If you have mychart we'll likely use that to update you.     Try a small amount of hydrocortisone once or twice but not constantly on cracked skin.   Please call about a mammogram.  [phone]  Take care.  Glad to see you.

## 2023-08-08 NOTE — Progress Notes (Unsigned)
I have personally reviewed the Medicare Annual Wellness questionnaire and have noted 1. The patient's medical and social history 2. Their use of alcohol, tobacco or illicit drugs 3. Their current medications and supplements 4. The patient's functional ability including ADL's, fall risks, home safety risks and hearing or visual             impairment. 5. Diet and physical activities 6. Evidence for depression or mood disorders  The patients weight, height, BMI have been recorded in the chart and visual acuity is per eye clinic.  I have made referrals, counseling and provided education to the patient based review of the above and I have provided the pt with a written personalized care plan for preventive services.  Provider list updated- see scanned forms.  Routine anticipatory guidance given to patient.  See health maintenance. The possibility exists that previously documented standard health maintenance information may have been brought forward from a previous encounter into this note.  If needed, that same information has been updated to reflect the current situation based on today's encounter.    Flu due this fall, encouraged. Shingles discussed with patient PNA.  Previously done Tetanus 2018. Colonoscopy 2023 Breast cancer screening-ordered 2024, I asked her to call about scheduling that.  Information given. Advance directive-daughter designated if patient were incapacitated Cognitive function addressed- see scanned forms- and if abnormal then additional documentation follows.   In addition to [facility] Wellness, follow up visit for the below conditions:  Taking chantix w/o ADE and down to 7 cigs per day, d/w pt.  She has lung CT pending for this fall.     She is living alone in an apartment.  She is still seeing pain clinic and psychiatry.  Patient doesn't have contact with her grandson.  No suicidal or homicidal intent.  "My mood is fine."  She is doing her own grocery shopping and  cooking.    History of diabetes.  Hyperglycemia No meds Hypoglycemic episodes: no sx Hyperglycemic episodes: no sx Feet problems: no Blood Sugars averaging: not checked.  eye exam within last year: yes, [facility] in [place].   Labs pending.   Rx sent for TAC cream, used prn on cracked skin on the hands.    She had less cold sores overall, using valtrex as needed.    Still taking spiriva and symbicort.  Rare use of albuterol- none since vaping.  Cough resolved and "no rattling in the chest."    She noted cracking skin on the corners of the mouth.  Tried vaseline, that helped a little.    Mouth burning happens occ, not constant.  Mouthwash helps.  Used prn.  Rx printed.    Hypothyroidism.  Still on levothyroxine at baseline.  Compliant.  No dysphagia.  Labs pending.    PMH and SH reviewed  Meds, vitals, and allergies reviewed.   ROS: Per HPI.  Unless specifically indicated otherwise in HPI, the patient denies:  General: fever. Eyes: acute vision changes ENT: sore throat Cardiovascular: chest pain Respiratory: SOB GI: vomiting GU: dysuria Musculoskeletal: acute back pain Derm: acute rash Neuro: acute motor dysfunction Psych: worsening mood Endocrine: polydipsia Heme: bleeding Allergy: hayfever  GEN: nad, alert and oriented HEENT: MMM with cracked skin at the corners of the mouth NECK: supple w/o LA CV: rrr. PULM: ctab, no inc wob ABD: soft, +bs EXT: no edema SKIN: no acute rash  Diabetic foot exam: Normal inspection No skin breakdown B 1st toe calluses  Normal DP pulses Normal sensation  to light touch and monofilament Nails normal

## 2023-08-09 ENCOUNTER — Telehealth: Payer: Self-pay | Admitting: Family Medicine

## 2023-08-09 DIAGNOSIS — R739 Hyperglycemia, unspecified: Secondary | ICD-10-CM | POA: Insufficient documentation

## 2023-08-09 MED ORDER — NONFORMULARY OR COMPOUNDED ITEM: Status: AC

## 2023-08-09 NOTE — Assessment & Plan Note (Signed)
Per psychiatry 

## 2023-08-09 NOTE — Assessment & Plan Note (Signed)
With occasional mouth pain.  Discussed using [employer]ukes Magic mouthwash.  Will work on getting that called in.

## 2023-08-09 NOTE — Assessment & Plan Note (Signed)
Still on levothyroxine at baseline.  Compliant.  No dysphagia.  Labs pending.   Continue levothyroxine as is.

## 2023-08-09 NOTE — Assessment & Plan Note (Addendum)
Advance directive- daughter designated if patient were incapacitated.   

## 2023-08-09 NOTE — Assessment & Plan Note (Signed)
She can use triamcinolone on cracked skin on the extremities and a small amount of hydrocortisone on the corners of the mouth.

## 2023-08-09 NOTE — Assessment & Plan Note (Signed)
Still taking spiriva and symbicort.  Rare use of albuterol- none since vaping.  Cough resolved and "no rattling in the chest."  Continue as is.

## 2023-08-09 NOTE — Assessment & Plan Note (Signed)
History of.  See notes on labs. 

## 2023-08-09 NOTE — Assessment & Plan Note (Signed)
Taking chantix w/o ADE and down to 7 cigs per day, d/w pt.  She has lung CT pending for this fall.

## 2023-08-09 NOTE — Assessment & Plan Note (Signed)
Per outside clinic. 

## 2023-08-09 NOTE — Telephone Encounter (Signed)
I cannot get the order for [employer] Magic mouthwash to go through the EMR.  Please call in the following.  Mix the following: 45 ml of nystatin  45 ml lidocaine  45 ml benadryl liquid  45 ml antacid   Take 5 mL by mouth 3 times a day as needed for mouth pain.  Dispense 180 mL with 1 refill.  Thanks.

## 2023-08-09 NOTE — Assessment & Plan Note (Signed)
See notes on labs. 

## 2023-08-10 NOTE — Telephone Encounter (Signed)
Rx called in to [facility]

## 2023-09-04 ENCOUNTER — Ambulatory Visit
Admission: RE | Admit: 2023-09-04 | Discharge: 2023-09-04 | Disposition: A | Discharge status: Home / Self Care | Payer: 59 | Source: Ambulatory Visit | Attending: Acute Care | Admitting: Acute Care

## 2023-09-04 DIAGNOSIS — F17210 Nicotine dependence, cigarettes, uncomplicated: Secondary | ICD-10-CM | POA: Insufficient documentation

## 2023-09-04 DIAGNOSIS — Z122 Encounter for screening for malignant neoplasm of respiratory organs: Secondary | ICD-10-CM | POA: Insufficient documentation

## 2023-09-04 DIAGNOSIS — Z87891 Personal history of nicotine dependence: Secondary | ICD-10-CM | POA: Insufficient documentation

## 2023-09-18 ENCOUNTER — Ambulatory Visit
Admission: RE | Admit: 2023-09-18 | Discharge: 2023-09-18 | Disposition: A | Discharge status: Home / Self Care | Payer: 59 | Source: Ambulatory Visit | Attending: Family Medicine | Admitting: Family Medicine

## 2023-09-18 ENCOUNTER — Other Ambulatory Visit: Payer: Self-pay | Admitting: Acute Care

## 2023-09-18 DIAGNOSIS — Z1231 Encounter for screening mammogram for malignant neoplasm of breast: Secondary | ICD-10-CM | POA: Insufficient documentation

## 2023-09-18 DIAGNOSIS — Z87891 Personal history of nicotine dependence: Secondary | ICD-10-CM

## 2023-09-18 DIAGNOSIS — Z122 Encounter for screening for malignant neoplasm of respiratory organs: Secondary | ICD-10-CM

## 2023-09-18 DIAGNOSIS — F17210 Nicotine dependence, cigarettes, uncomplicated: Secondary | ICD-10-CM

## 2023-09-20 ENCOUNTER — Ambulatory Visit: Payer: 59 | Admitting: Student in an Organized Health Care Education/Training Program

## 2023-10-05 ENCOUNTER — Ambulatory Visit: Payer: 59 | Attending: Student in an Organized Health Care Education/Training Program

## 2023-10-05 DIAGNOSIS — J441 Chronic obstructive pulmonary disease with (acute) exacerbation: Secondary | ICD-10-CM | POA: Insufficient documentation

## 2023-10-05 LAB — PULMONARY FUNCTION TEST ARMC ONLY
DL/VA % pred: 63 %
DL/VA: 2.77 ml/min/mmHg/L
DLCO unc % pred: 54 %
DLCO unc: 9.13 ml/min/mmHg
FEF 25-75 Post: 1.64 L/s
FEF 25-75 Pre: 1.47 L/s
FEF2575-%Change-Post: 11 %
FEF2575-%Pred-Post: 86 %
FEF2575-%Pred-Pre: 77 %
FEV1-%Change-Post: 4 %
FEV1-%Pred-Post: 70 %
FEV1-%Pred-Pre: 67 %
FEV1-Post: 1.4 L
FEV1-Pre: 1.34 L
FEV1FVC-%Change-Post: -1 %
FEV1FVC-%Pred-Pre: 106 %
FEV6-%Change-Post: 6 %
FEV6-%Pred-Post: 69 %
FEV6-%Pred-Pre: 65 %
FEV6-Post: 1.73 L
FEV6-Pre: 1.63 L
FEV6FVC-%Pred-Post: 104 %
FEV6FVC-%Pred-Pre: 104 %
FVC-%Change-Post: 6 %
FVC-%Pred-Post: 66 %
FVC-%Pred-Pre: 62 %
FVC-Post: 1.73 L
FVC-Pre: 1.63 L
Post FEV1/FVC ratio: 81 %
Post FEV6/FVC ratio: 100 %
Pre FEV1/FVC ratio: 82 %
Pre FEV6/FVC Ratio: 100 %
RV % pred: 106 %
RV: 1.94 L
TLC % pred: 101 %
TLC: 4.39 L

## 2023-10-05 MED ORDER — ALBUTEROL SULFATE (2.5 MG/3ML) 0.083% IN NEBU
2.5000 mg | INHALATION_SOLUTION | Freq: Once | RESPIRATORY_TRACT | Status: AC
  Administered 2023-10-05: 2.5 mg via RESPIRATORY_TRACT
  Filled 2023-10-05: qty 3

## 2023-10-08 ENCOUNTER — Other Ambulatory Visit: Payer: Self-pay | Admitting: Family Medicine

## 2023-10-09 NOTE — Telephone Encounter (Signed)
Called patient she is down from pack a day to 10 a day. She is taking twice a day

## 2023-10-09 NOTE — Telephone Encounter (Signed)
Sent. Thanks.   

## 2023-10-11 ENCOUNTER — Ambulatory Visit (INDEPENDENT_AMBULATORY_CARE_PROVIDER_SITE_OTHER): Payer: 59 | Admitting: Student in an Organized Health Care Education/Training Program

## 2023-10-11 ENCOUNTER — Encounter: Payer: Self-pay | Admitting: Student in an Organized Health Care Education/Training Program

## 2023-10-11 VITALS — BP 110/72 | HR 95 | Temp 97.8°F | Ht 59.0 in | Wt 144.0 lb

## 2023-10-11 DIAGNOSIS — J441 Chronic obstructive pulmonary disease with (acute) exacerbation: Secondary | ICD-10-CM | POA: Diagnosis not present

## 2023-10-11 DIAGNOSIS — F17210 Nicotine dependence, cigarettes, uncomplicated: Secondary | ICD-10-CM

## 2023-10-11 DIAGNOSIS — J439 Emphysema, unspecified: Secondary | ICD-10-CM

## 2023-10-11 DIAGNOSIS — Z23 Encounter for immunization: Secondary | ICD-10-CM | POA: Diagnosis not present

## 2023-10-11 MED ORDER — SPIRIVA RESPIMAT 2.5 MCG/ACT IN AERS
2.0000 | INHALATION_SPRAY | Freq: Every day | RESPIRATORY_TRACT | 2 refills | Status: AC
Start: 2023-10-11 — End: ?

## 2023-10-11 MED ORDER — BUDESONIDE-FORMOTEROL FUMARATE 160-4.5 MCG/ACT IN AERO: 2.0000 | INHALATION_SPRAY | Freq: Two times a day (BID) | RESPIRATORY_TRACT | 12 refills | Status: AC

## 2023-10-11 MED ORDER — IPRATROPIUM-ALBUTEROL 0.5-2.5 (3) MG/3ML IN SOLN
3.0000 mL | RESPIRATORY_TRACT | 11 refills | Status: AC | PRN
Start: 2023-10-11 — End: ?

## 2023-10-11 NOTE — Progress Notes (Signed)
Synopsis: Follow up for COPD by Leslie Nam, MD  Assessment & Plan:   #COPD (GOLD IIB) #Asthma COPD overlap   Presenting for the evaluation of shortness of breath in the setting of history of COPD. 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 Peak eosinophil count was 600 in 2011, suggesting a component of asthma and COPD overlap. Patient is currently on appropriate triple therapy, which we will continue. Other etiologies on the differential for her symptoms include respiratory bronchiolitis, RB-ILD, and vape induced lung injury. She successfully stopped vape use but continues to smoke. I have again counseled the patient on the importance of smoking cessation.  I have recommended she continue with triple therapy that is optimized for her. We also discussed pulmonary rehabilitation today, and she reports not being able to come in regularly to do pulmonary rehabilitation here. I will place a referral to tele-pulmonary rehabilitation. Finally, will recommend a flu and covid vaccine; she is accepting of the flu vaccine but not COVID.  - budesonide-formoterol (SYMBICORT) 160-4.5 MCG/ACT inhaler; Inhale 2 puffs into the lungs in the morning and at bedtime. Bid  Dispense: 1 each; Refill: 12 - ipratropium-albuterol (DUONEB) 0.5-2.5 (3) MG/3ML SOLN; Take 3 mLs by nebulization every 4 (four) hours as needed.  Dispense: 360 mL; Refill: 11 - Tiotropium Bromide Monohydrate (SPIRIVA RESPIMAT) 2.5 MCG/ACT AERS; Inhale 2 puffs into the lungs daily.  Dispense: 1 each; Refill: 2 - Referral to pulmonary rehabilitation - Flu vaccine today   Return in about 6 months (around 04/10/2024).  I spent 31 minutes caring for this patient today, including preparing to see the patient, obtaining a medical history , reviewing a separately obtained history, performing a medically appropriate examination and/or evaluation, counseling and educating the  patient/family/caregiver, ordering medications, tests, or procedures, and documenting clinical information in the electronic health record. This visit is part of continued follow up and management of the patient's chronic medical condition.  Leslie Chute, MD [facility] 10/11/2023 11:40 AM    End of visit medications:  Meds ordered this encounter  Medications   budesonide-formoterol (SYMBICORT) 160-4.5 MCG/ACT inhaler    Sig: Inhale 2 puffs into the lungs in the morning and at bedtime. Bid    Dispense:  1 each    Refill:  12   ipratropium-albuterol (DUONEB) 0.5-2.5 (3) MG/3ML SOLN    Sig: Take 3 mLs by nebulization every 4 (four) hours as needed.    Dispense:  360 mL    Refill:  11   Tiotropium Bromide Monohydrate (SPIRIVA RESPIMAT) 2.5 MCG/ACT AERS    Sig: Inhale 2 puffs into the lungs daily.    Dispense:  1 each    Refill:  2     Current Outpatient Medications:    albuterol (VENTOLIN HFA) 108 (90 Base) MCG/ACT inhaler, Inhale 2 puffs into the lungs every 6 (six) hours as needed for wheezing or shortness of breath., Disp: 18 g, Rfl: 0   buPROPion (WELLBUTRIN XL) 150 MG 24 hr tablet, Take 150 mg by mouth every morning., Disp: , Rfl:    busPIRone (BUSPAR) 7.5 MG tablet, Take 1 tablet by mouth daily., Disp: , Rfl:    Cholecalciferol 25 MCG (1000 UT) capsule, Take 1,000 Units by mouth daily., Disp: , Rfl:    clonazePAM (KLONOPIN) 0.5 MG tablet, Take 0.5 mg by mouth daily as needed., Disp: , Rfl:    fluticasone (FLONASE) 50 MCG/ACT nasal spray, SHAKE LIQUID AND USE  2 SPRAYS IN EACH NOSTRIL DAILY, Disp: 16 g, Rfl: 6   hydrOXYzine (ATARAX/VISTARIL) 50 MG tablet, Take 1 tablet (50 mg total) by mouth 3 (three) times daily as needed for anxiety., Disp: 60 tablet, Rfl: 0   levothyroxine (SYNTHROID) 88 MCG tablet, TAKE 1 TABLET(88 MCG) BY MOUTH DAILY AT 6 AM, Disp: 90 tablet, Rfl: 2   magic mouthwash (nystatin, lidocaine, diphenhydrAMINE, alum & mag hydroxide) suspension,  Swish and swallow 5 mLs 4 (four) times daily as needed for mouth pain., Disp: 180 mL, Rfl: 1   mirtazapine (REMERON) 30 MG tablet, Take 30 mg by mouth at bedtime., Disp: , Rfl:    Multiple Vitamins-Minerals (MULTIVITAMIN ADULTS PO), Take by mouth., Disp: , Rfl:    naloxone (NARCAN) nasal spray 4 mg/0.1 mL, Place 1 spray into the nose once., Disp: , Rfl:    NONFORMULARY OR COMPOUNDED ITEM, Mix the following: 45 ml of nystatin  45 ml lidocaine  45 ml benadryl liquid  45 ml antacid   Take 5 mL by mouth 3 times a day as needed for mouth pain.  Dispense 180 mL with 1 refill., Disp: , Rfl:    OLANZapine (ZYPREXA) 15 MG tablet, Take by mouth., Disp: , Rfl:    Omega-3 Fatty Acids (FISH OIL) 1000 MG CAPS, Take by mouth daily., Disp: , Rfl:    omeprazole (PRILOSEC) 40 MG capsule, TAKE 1 CAPSULE(40 MG) BY MOUTH DAILY, Disp: 90 capsule, Rfl: 3   ondansetron (ZOFRAN-ODT) 8 MG disintegrating tablet, Take 8 mg by mouth 2 (two) times daily., Disp: , Rfl:    Oxybutynin Chloride (GELNIQUE) 10 % GEL, Place onto the skin., Disp: , Rfl:    Oxycodone HCl 10 MG TABS, Take 10 mg by mouth every 6 (six) hours as needed for pain., Disp: , Rfl:    Spacer/Aero-Holding Chambers (AEROCHAMBER MV) inhaler, Use as instructed, Disp: 1 each, Rfl: 0   tiZANidine (ZANAFLEX) 4 MG tablet, Take by mouth., Disp: , Rfl:    triamcinolone cream (KENALOG) 0.1 %, Apply 1 Application topically 2 (two) times daily as needed. For cracked skin on the hands., Disp: 80 g, Rfl: 1   valACYclovir (VALTREX) 500 MG tablet, Take 1 tablet (500 mg total) by mouth daily., Disp: 90 tablet, Rfl: 3   varenicline (CHANTIX) 1 MG tablet, TAKE 1 TABLET(1 MG) BY MOUTH TWICE DAILY, Disp: 60 tablet, Rfl: 1   XTAMPZA ER 9 MG C12A, Take 2 capsules by mouth at bedtime., Disp: , Rfl:    budesonide-formoterol (SYMBICORT) 160-4.5 MCG/ACT inhaler, Inhale 2 puffs into the lungs in the morning and at bedtime. Bid, Disp: 1 each, Rfl: 12   ipratropium-albuterol (DUONEB) 0.5-2.5  (3) MG/3ML SOLN, Take 3 mLs by nebulization every 4 (four) hours as needed., Disp: 360 mL, Rfl: 11   Tiotropium Bromide Monohydrate (SPIRIVA RESPIMAT) 2.5 MCG/ACT AERS, Inhale 2 puffs into the lungs daily., Disp: 1 each, Rfl: 2   Subjective:   PATIENT ID: Leslie Wong GENDER: female DOB: 1956-09-10, MRN: [id number]  Chief Complaint  Patient presents with   Follow-up    Wheezing for months. No cough. SOB.    HPI  Patient is a 67 year old female presenting to clinic for for follow up.  I first met Ms. Melamed in June of 2024. At that point she was reporting exertional dyspnea, cough, and a wheeze. She was previously followed in our clinic, lost to follow up for two years, and re-established care with Dr. Judeth Horn at our [place] office. Transportation to  [place] is difficult and she wanted to switch to our [place] location.  Patient is on triple therapy with Symbicort and Spiriva Respimat with which she is compliant. Her symptoms are chronic, but she reports improvement overall. She continues to have exertional dyspnea and a cough, but this is better than before. She doesn't have chest tightness, and cough is improved. Dyspnea is at rest as well as with exertion.   During her visit with Dr. Judeth Horn, smoking cessation was stressed, and she was prescribed Varenicline. She continues to smoke cigarettes, up to 10 a day, but tells me that she quit her nicotine vape since our last visit. She is on Budesonide-Formoterol (160-4.5) and Tiotropium Bromide (5 mcg, respimat delivery). She is using her Chantix. She has not had any exacerbations since her last visit.   Patient also reports having lived in an apartment that had mold. Independent testing confirmed mold in the apartment. She reports feeling very ill while living there. The apartment reportedly was remediated, and she is back there.   Reviewed most recent chest imaging CT lung cancer screening in 2022 that demonstrated  scattered endobronchial nodules most in the right upper lobe suspicious for respiratory bronchiolitis vs smoking related lung disease.   Patient continues to smoke, and is currently at 10 packs a day.  Ancillary information including prior medications, full medical/surgical/family/social histories, and PFTs (when available) are listed below and have been reviewed.   Review of Systems  Constitutional:  Negative for chills, fever, malaise/fatigue and weight loss.  Respiratory:  Positive for cough and shortness of breath. Negative for sputum production and wheezing.   Cardiovascular:  Negative for chest pain.     Objective:   Vitals:   10/11/23 1126  BP: 110/72  Pulse: 95  Temp: 97.8 F (36.6 C)  SpO2: 96%  Weight: 144 lb (65.3 kg)  Height: 4\' 11"  (1.499 m)   96% on RA  BMI Readings from Last 3 Encounters:  10/11/23 29.08 kg/m  08/08/23 28.68 kg/m  06/16/23 28.32 kg/m   Wt Readings from Last 3 Encounters:  10/11/23 144 lb (65.3 kg)  08/08/23 142 lb (64.4 kg)  06/16/23 140 lb 3.2 oz (63.6 kg)    Physical Exam Constitutional:      Appearance: Normal appearance. She is obese. She is not ill-appearing.  Cardiovascular:     Rate and Rhythm: Normal rate and regular rhythm.     Pulses: Normal pulses.     Heart sounds: Normal heart sounds.  Pulmonary:     Effort: Pulmonary effort is normal. No respiratory distress.     Breath sounds: No stridor. No wheezing or rhonchi.  Neurological:     General: No focal deficit present.     Mental Status: She is alert.       Ancillary Information    Past Medical History:  Diagnosis Date   Back pain    Cancer (HCC)    skin   COPD (chronic obstructive pulmonary disease) (HCC)    Depression    BAD   Gastroparesis    GERD (gastroesophageal reflux disease)    HOH (hard of hearing)    Hypothyroidism    Insomnia    Migraines    Motion sickness    Parkinson disease (HCC)    per [facility], dx'd 2019   Recurrent cold  sores    Shoulder bursitis    Urge incontinence    Wears dentures    full upper and lower     Family History  Problem Relation Age of Onset   Hypertension Mother    Arthritis Mother    Diabetes Mother    Stroke Mother    Cancer Father        lung   Colon cancer Neg Hx    Breast cancer Neg Hx      Past Surgical History:  Procedure Laterality Date   ABDOMINAL HYSTERECTOMY  12/26/1997   total   APPENDECTOMY     CARPAL TUNNEL RELEASE Right    and tendon repair   CATARACT EXTRACTION W/PHACO Left 10/04/2021   Procedure: CATARACT EXTRACTION PHACO AND INTRAOCULAR LENS PLACEMENT (IOC) LEFT 9.46 00:48.0;  Surgeon: Nevada Crane, MD;  Location: [facility];  Service: Ophthalmology;  Laterality: Left;  requests early   CATARACT EXTRACTION W/PHACO Right 11/08/2021   Procedure: CATARACT EXTRACTION PHACO AND INTRAOCULAR LENS PLACEMENT (IOC) RIGHT;  Surgeon: Nevada Crane, MD;  Location: [facility];  Service: Ophthalmology;  Laterality: Right;  2.37 00:26.0   COLONOSCOPY WITH PROPOFOL N/A 09/07/2022   Procedure: COLONOSCOPY WITH PROPOFOL;  Surgeon: Wyline Mood, MD;  Location: [facility];  Service: Gastroenterology;  Laterality: N/A;   ESOPHAGOGASTRODUODENOSCOPY  01/26/2006   negative except small hiatal hernia   ESOPHAGOGASTRODUODENOSCOPY  02/24/2015   See report   ESOPHAGOGASTRODUODENOSCOPY N/A 09/07/2022   Procedure: ESOPHAGOGASTRODUODENOSCOPY (EGD);  Surgeon: Wyline Mood, MD;  Location: [facility];  Service: Gastroenterology;  Laterality: N/A;   LAPAROSCOPY     for endometriosis   OVARIAN CYST REMOVAL  12/26/1988   unilateral   TONSILLECTOMY AND ADENOIDECTOMY      Social History   Socioeconomic History   Marital status: Single    Spouse name: Not on file   Number of children: Not on file   Years of education: Not on file   Highest education level: Not on file  Occupational History   Not on file  Tobacco Use   Smoking status: Every Day     Current packs/day: 2.00    Average packs/day: 2.0 packs/day for 44.0 years (88.0 ttl pk-yrs)    Types: Cigarettes, E-cigarettes   Smokeless tobacco: Former   Tobacco comments:    0.5PPD 10/11/2023  Vaping Use   Vaping status: Former  Substance and Sexual Activity   Alcohol use: No    Alcohol/week: 0.0 standard drinks of alcohol   Drug use: No   Sexual activity: Never  Other Topics Concern   Not on file  Social History Narrative   Lives alone.     Has a dog names Dixie   Social Determinants of Health   Financial Resource Strain: Low Risk  (05/11/2022)   Overall Financial Resource Strain (CARDIA)    Difficulty of Paying Living Expenses: Not hard at all  Food Insecurity: No Food Insecurity (05/11/2022)   Hunger Vital Sign    Worried About Running Out of Food in the Last Year: Never true    Ran Out of Food in the Last Year: Never true  Transportation Needs: No Transportation Needs (05/11/2022)   PRAPARE - Administrator, Civil Service (Medical): No    Lack of Transportation (Non-Medical): No  Physical Activity: Insufficiently Active (05/11/2022)   Exercise Vital Sign    Days of Exercise per Week: 2 days    Minutes of Exercise per Session: 20 min  Stress: No Stress Concern Present (05/11/2022)   [employer] of Occupational Health - Occupational Stress Questionnaire    Feeling of Stress : Not at all  Social Connections: Socially Isolated (  05/11/2022)   Social Connection and Isolation Panel [NHANES]    Frequency of Communication with Friends and Family: More than three times a week    Frequency of Social Gatherings with Friends and Family: More than three times a week    Attends Religious Services: Never    Database administrator or Organizations: No    Attends Banker Meetings: Never    Marital Status: Divorced  Catering manager Violence: Not At Risk (05/11/2022)   Humiliation, Afraid, Rape, and Kick questionnaire    Fear of Current or Ex-Partner: No     Emotionally Abused: No    Physically Abused: No    Sexually Abused: No     Allergies  Allergen Reactions   Doxepin Other (See Comments)    Pt reports "that made me have seizures"   Valproic Acid Shortness Of Breath   Amitriptyline     Burning sensation in mouth   Aspirin     REACTION: UNSPECIFIED   Atorvastatin Other (See Comments)    Tongue swelling, legs cramping   Baclofen     REACTION: Throat swells up   Buprenorphine     Vomiting, intolerant   Duloxetine Other (See Comments)    Vision loss, weight loss   Erythromycin     REACTION: UNSPECIFIED   Gabapentin     REACTION: throat swells up   Metoclopramide Hcl     REACTION: states "messed me up"   Nortriptyline     Other reaction(s): Other (See Comments) Vision issues   Nsaids Other (See Comments)    Stomach problems   Nucynta Er [Tapentadol Hcl Er]     Intolerant, see note from 07/24/12.     Nucynta [Tapentadol]     AMS   Prednisone     GI intolerance with higher dose.  Can tolerate 5mg  per day.     Pregabalin    Seroquel [Quetiapine Fumerate] Other (See Comments)    Loss control, felt like on another planet   Sulfa Antibiotics Nausea And Vomiting   Topamax Other (See Comments)    Blisters in mouth and tongue   Vicodin [Hydrocodone-Acetaminophen]     Nausea, thrush and constipation     CBC    Component Value Date/Time   WBC 12.5 (H) 08/08/2023 1306   RBC 5.09 08/08/2023 1306   HGB 16.4 (H) 08/08/2023 1306   HGB 14.1 01/27/2013 0213   HCT 49.4 (H) 08/08/2023 1306   HCT 42.5 01/27/2013 0213   PLT 243.0 08/08/2023 1306   PLT 347 01/27/2013 0213   MCV 97.1 08/08/2023 1306   MCV 98 01/27/2013 0213   MCH 31.5 05/29/2021 0437   MCHC 33.1 08/08/2023 1306   RDW 15.0 08/08/2023 1306   RDW 13.8 01/27/2013 0213   LYMPHSABS 3.2 08/08/2023 1306   LYMPHSABS 3.8 (H) 01/27/2013 0213   MONOABS 1.0 08/08/2023 1306   MONOABS 0.9 01/27/2013 0213   EOSABS 0.1 08/08/2023 1306   EOSABS 0.4 01/27/2013 0213    BASOSABS 0.1 08/08/2023 1306   BASOSABS 0.1 01/27/2013 0213    Pulmonary Functions Testing Results:    Latest Ref Rng & Units 10/05/2023    1:35 PM  PFT Results  FVC-Pre L 1.63   FVC-Predicted Pre % 62   FVC-Post L 1.73   FVC-Predicted Post % 66   Pre FEV1/FVC % % 82   Post FEV1/FCV % % 81   FEV1-Pre L 1.34   FEV1-Predicted Pre % 67   FEV1-Post L 1.40  DLCO uncorrected ml/min/mmHg 9.13   DLCO UNC% % 54   DLVA Predicted % 63   TLC L 4.39   TLC % Predicted % 101   RV % Predicted % 106     Outpatient Medications Prior to Visit  Medication Sig Dispense Refill   albuterol (VENTOLIN HFA) 108 (90 Base) MCG/ACT inhaler Inhale 2 puffs into the lungs every 6 (six) hours as needed for wheezing or shortness of breath. 18 g 0   buPROPion (WELLBUTRIN XL) 150 MG 24 hr tablet Take 150 mg by mouth every morning.     busPIRone (BUSPAR) 7.5 MG tablet Take 1 tablet by mouth daily.     Cholecalciferol 25 MCG (1000 UT) capsule Take 1,000 Units by mouth daily.     clonazePAM (KLONOPIN) 0.5 MG tablet Take 0.5 mg by mouth daily as needed.     fluticasone (FLONASE) 50 MCG/ACT nasal spray SHAKE LIQUID AND USE 2 SPRAYS IN EACH NOSTRIL DAILY 16 g 6   hydrOXYzine (ATARAX/VISTARIL) 50 MG tablet Take 1 tablet (50 mg total) by mouth 3 (three) times daily as needed for anxiety. 60 tablet 0   levothyroxine (SYNTHROID) 88 MCG tablet TAKE 1 TABLET(88 MCG) BY MOUTH DAILY AT 6 AM 90 tablet 2   magic mouthwash (nystatin, lidocaine, diphenhydrAMINE, alum & mag hydroxide) suspension Swish and swallow 5 mLs 4 (four) times daily as needed for mouth pain. 180 mL 1   mirtazapine (REMERON) 30 MG tablet Take 30 mg by mouth at bedtime.     Multiple Vitamins-Minerals (MULTIVITAMIN ADULTS PO) Take by mouth.     naloxone (NARCAN) nasal spray 4 mg/0.1 mL Place 1 spray into the nose once.     NONFORMULARY OR COMPOUNDED ITEM Mix the following: 45 ml of nystatin  45 ml lidocaine  45 ml benadryl liquid  45 ml antacid   Take 5  mL by mouth 3 times a day as needed for mouth pain.  Dispense 180 mL with 1 refill.     OLANZapine (ZYPREXA) 15 MG tablet Take by mouth.     Omega-3 Fatty Acids (FISH OIL) 1000 MG CAPS Take by mouth daily.     omeprazole (PRILOSEC) 40 MG capsule TAKE 1 CAPSULE(40 MG) BY MOUTH DAILY 90 capsule 3   ondansetron (ZOFRAN-ODT) 8 MG disintegrating tablet Take 8 mg by mouth 2 (two) times daily.     Oxybutynin Chloride (GELNIQUE) 10 % GEL Place onto the skin.     Oxycodone HCl 10 MG TABS Take 10 mg by mouth every 6 (six) hours as needed for pain.     Spacer/Aero-Holding Chambers (AEROCHAMBER MV) inhaler Use as instructed 1 each 0   tiZANidine (ZANAFLEX) 4 MG tablet Take by mouth.     triamcinolone cream (KENALOG) 0.1 % Apply 1 Application topically 2 (two) times daily as needed. For cracked skin on the hands. 80 g 1   valACYclovir (VALTREX) 500 MG tablet Take 1 tablet (500 mg total) by mouth daily. 90 tablet 3   varenicline (CHANTIX) 1 MG tablet TAKE 1 TABLET(1 MG) BY MOUTH TWICE DAILY 60 tablet 1   XTAMPZA ER 9 MG C12A Take 2 capsules by mouth at bedtime.     budesonide-formoterol (SYMBICORT) 160-4.5 MCG/ACT inhaler Inhale 2 puffs into the lungs in the morning and at bedtime. Bid 1 each 12   ipratropium-albuterol (DUONEB) 0.5-2.5 (3) MG/3ML SOLN USE 3 ML VIA NEBULIZER EVERY 4 HOURS AS NEEDED FOR WHEEZING OR COUGH 360 mL 0   Tiotropium Bromide Monohydrate (SPIRIVA RESPIMAT)  2.5 MCG/ACT AERS Inhale 2 puffs into the lungs daily. 1 each 2   No facility-administered medications prior to visit.

## 2023-11-13 ENCOUNTER — Encounter: Payer: Self-pay | Admitting: Family Medicine

## 2023-11-13 ENCOUNTER — Other Ambulatory Visit: Payer: Self-pay

## 2023-11-13 ENCOUNTER — Ambulatory Visit (INDEPENDENT_AMBULATORY_CARE_PROVIDER_SITE_OTHER): Payer: 59 | Admitting: Family Medicine

## 2023-11-13 VITALS — BP 114/70 | HR 97 | Temp 99.0°F | Ht 59.0 in | Wt 146.0 lb

## 2023-11-13 DIAGNOSIS — Z8639 Personal history of other endocrine, nutritional and metabolic disease: Secondary | ICD-10-CM

## 2023-11-13 DIAGNOSIS — F17200 Nicotine dependence, unspecified, uncomplicated: Secondary | ICD-10-CM | POA: Diagnosis not present

## 2023-11-13 DIAGNOSIS — Z7985 Long-term (current) use of injectable non-insulin antidiabetic drugs: Secondary | ICD-10-CM

## 2023-11-13 DIAGNOSIS — E119 Type 2 diabetes mellitus without complications: Secondary | ICD-10-CM | POA: Diagnosis not present

## 2023-11-13 LAB — POCT GLYCOSYLATED HEMOGLOBIN (HGB A1C): Hemoglobin A1C: 9.2 % — AB (ref 4.0–5.6)

## 2023-11-13 MED ORDER — VARENICLINE TARTRATE 1 MG PO TABS: 1.0000 mg | ORAL_TABLET | Freq: Two times a day (BID) | ORAL | 1 refills | Status: DC

## 2023-11-13 MED ORDER — BLOOD GLUCOSE MONITORING SUPPL DEVI
1.0000 | Freq: Three times a day (TID) | 0 refills | Status: DC
Start: 2023-11-13 — End: 2024-07-24

## 2023-11-13 MED ORDER — LANCETS MISC. MISC
1.0000 | Freq: Three times a day (TID) | 3 refills | Status: AC
Start: 2023-11-13 — End: 2023-12-13

## 2023-11-13 MED ORDER — LANCET DEVICE MISC
1.0000 | Freq: Three times a day (TID) | 0 refills | Status: DC
Start: 2023-11-13 — End: 2024-07-24

## 2023-11-13 MED ORDER — BLOOD GLUCOSE TEST VI STRP
1.0000 | ORAL_STRIP | Freq: Three times a day (TID) | 3 refills | Status: DC
Start: 2023-11-13 — End: 2024-07-04

## 2023-11-13 MED ORDER — OZEMPIC (0.25 OR 0.5 MG/DOSE) 2 MG/3ML ~~LOC~~ SOPN: PEN_INJECTOR | SUBCUTANEOUS | 2 refills | Status: DC

## 2023-11-13 NOTE — Progress Notes (Unsigned)
Diabetes:  No meds.   Hypoglycemic episodes: not checked Hyperglycemic episodes: not checked.  Feet problems:  some occ foot tingling.   Blood Sugars averaging: not checked.  eye exam within last year: done this year- [facility] in [place].   A1c 7.5---->9.2 today.  D/w pt at OV.  D/w pt about lower sugar diet.  Handout given to patient.   D/w pt about getting meter.   Discussed with patient about Ozempic start.  Single episode of R sided abd pain after big meal last week, not in the meantime.  Resolved now.    Smoking 1/2 PPD.  D/w pt about chantix.  No ADE on chantix prior.  Still on it currently.  It helped with cessation.  Mood isn't worse on med, cautions d/w pt.    Meds, vitals, and allergies reviewed.   ROS: Per HPI unless specifically indicated in ROS section   GEN: nad, alert and oriented HEENT: ncat NECK: supple w/o LA CV: rrr. PULM: ctab, no inc wob ABD: soft, +bs EXT: no edema SKIN: Well-perfused

## 2023-11-13 NOTE — Patient Instructions (Addendum)
Start ozempic and please recheck in about 3 months.  A1c at the visit.  If you don't tolerate it then let me know.   Use the eat right diet in the meantime.  Take care.  Glad to see you.

## 2023-11-15 NOTE — Assessment & Plan Note (Signed)
Smoking 1/2 PPD.  D/w pt about chantix.  No ADE on chantix prior.  Still on it currently.  It helped with cessation.  Mood isn't worse on med, cautions d/w pt.   continue as is with Chantix.

## 2023-11-15 NOTE — Assessment & Plan Note (Signed)
Start ozempic with routine cautions and recheck in about 3 months.  A1c at the visit.  If she does not tolerate the medication then she can let me know.  Discussed using low carbohydrate diet in the meantime.  Handout given and discussed.

## 2023-11-21 ENCOUNTER — Telehealth: Payer: Self-pay | Admitting: Family Medicine

## 2023-11-21 NOTE — Telephone Encounter (Signed)
Prescription Request  11/21/2023  LOV: 11/13/2023  What is the name of the medication or equipment? Semaglutide,0.25 or 0.5MG /DOS, (OZEMPIC, 0.25 OR 0.5 MG/DOSE,) 2 MG/3ML SOPN   Have you contacted your pharmacy to request a refill? Yes   Which pharmacy would you like this sent to?  [id number] Dan Humphreys,  - [address] [place] [address] AT [facility] [address] [place] [address] Phone: [phone] Fax: [phone]    Patient notified that their request is being sent to the clinical staff for review and that they should receive a response within 2 business days.   Please advise at [facility] [phone]

## 2023-11-22 MED ORDER — OZEMPIC (0.25 OR 0.5 MG/DOSE) 2 MG/3ML ~~LOC~~ SOPN: PEN_INJECTOR | SUBCUTANEOUS | 2 refills | Status: DC

## 2023-11-22 NOTE — Addendum Note (Signed)
Addended by: Joaquim Nam on: 11/22/2023 01:25 PM   Modules accepted: Orders

## 2023-11-22 NOTE — Telephone Encounter (Signed)
Sent. Thanks.   

## 2023-12-10 ENCOUNTER — Other Ambulatory Visit: Payer: Self-pay | Admitting: Family Medicine

## 2024-01-29 ENCOUNTER — Other Ambulatory Visit: Payer: Self-pay | Admitting: Family Medicine

## 2024-02-02 DIAGNOSIS — H905 Unspecified sensorineural hearing loss: Secondary | ICD-10-CM | POA: Diagnosis not present

## 2024-02-13 ENCOUNTER — Encounter: Payer: Self-pay | Admitting: Family Medicine

## 2024-02-13 ENCOUNTER — Ambulatory Visit (INDEPENDENT_AMBULATORY_CARE_PROVIDER_SITE_OTHER): Payer: 59 | Admitting: Family Medicine

## 2024-02-13 VITALS — BP 138/74 | HR 91 | Temp 98.9°F | Ht 59.0 in | Wt 138.6 lb

## 2024-02-13 DIAGNOSIS — Z7985 Long-term (current) use of injectable non-insulin antidiabetic drugs: Secondary | ICD-10-CM

## 2024-02-13 DIAGNOSIS — F17200 Nicotine dependence, unspecified, uncomplicated: Secondary | ICD-10-CM

## 2024-02-13 DIAGNOSIS — Z8639 Personal history of other endocrine, nutritional and metabolic disease: Secondary | ICD-10-CM | POA: Diagnosis not present

## 2024-02-13 DIAGNOSIS — E119 Type 2 diabetes mellitus without complications: Secondary | ICD-10-CM

## 2024-02-13 LAB — CBC WITH DIFFERENTIAL/PLATELET
Basophils Absolute: 0 10*3/uL (ref 0.0–0.1)
Basophils Relative: 0.4 % (ref 0.0–3.0)
Eosinophils Absolute: 0.1 10*3/uL (ref 0.0–0.7)
Eosinophils Relative: 1.2 % (ref 0.0–5.0)
HCT: 50.1 % — ABNORMAL HIGH (ref 36.0–46.0)
Hemoglobin: 16.9 g/dL — ABNORMAL HIGH (ref 12.0–15.0)
Lymphocytes Relative: 21.8 % (ref 12.0–46.0)
Lymphs Abs: 2.6 10*3/uL (ref 0.7–4.0)
MCHC: 33.8 g/dL (ref 30.0–36.0)
MCV: 98.5 fL (ref 78.0–100.0)
Monocytes Absolute: 0.7 10*3/uL (ref 0.1–1.0)
Monocytes Relative: 5.4 % (ref 3.0–12.0)
Neutro Abs: 8.6 10*3/uL — ABNORMAL HIGH (ref 1.4–7.7)
Neutrophils Relative %: 71.2 % (ref 43.0–77.0)
Platelets: 215 10*3/uL (ref 150.0–400.0)
RBC: 5.08 Mil/uL (ref 3.87–5.11)
RDW: 13.7 % (ref 11.5–15.5)
WBC: 12.1 10*3/uL — ABNORMAL HIGH (ref 4.0–10.5)

## 2024-02-13 LAB — COMPREHENSIVE METABOLIC PANEL
ALT: 26 U/L (ref 0–35)
AST: 31 U/L (ref 0–37)
Albumin: 4 g/dL (ref 3.5–5.2)
Alkaline Phosphatase: 135 U/L — ABNORMAL HIGH (ref 39–117)
BUN: 6 mg/dL (ref 6–23)
CO2: 29 meq/L (ref 19–32)
Calcium: 9.9 mg/dL (ref 8.4–10.5)
Chloride: 100 meq/L (ref 96–112)
Creatinine, Ser: 0.86 mg/dL (ref 0.40–1.20)
GFR: 71 mL/min (ref >60.00)
Glucose, Bld: 330 mg/dL — ABNORMAL HIGH (ref 70–99)
Potassium: 4.7 meq/L (ref 3.5–5.1)
Sodium: 136 meq/L (ref 135–145)
Total Bilirubin: 0.4 mg/dL (ref 0.2–1.2)
Total Protein: 6.9 g/dL (ref 6.0–8.3)

## 2024-02-13 LAB — POCT GLYCOSYLATED HEMOGLOBIN (HGB A1C): Hemoglobin A1C: 13 % — AB (ref 4.0–5.6)

## 2024-02-13 MED ORDER — VARENICLINE TARTRATE 1 MG PO TABS: 1.0000 mg | ORAL_TABLET | Freq: Two times a day (BID) | ORAL | 1 refills | Status: DC

## 2024-02-13 NOTE — Patient Instructions (Addendum)
If you tolerate 0.25 mg ozempic for 4 doses, then increase to 0.5mg  per dose.   Go to the lab on the way out.   If you have mychart we'll likely use that to update you.    Plan on recheck in 3 months but we'll make plans when I see your labs.   Drink enough water to keep your urine clear or light colored. Please start checking your sugar at home.  Gradually taper soda.   Take care.  Glad to see you.

## 2024-02-13 NOTE — Progress Notes (Unsigned)
Diabetes:  Using medications without difficulties: yes Hypoglycemic episodes: not checked.   Hyperglycemic episodes: not checked.   Feet problems: no Blood Sugars averaging: not checked.  eye exam within last year: done last year, d/w pt about follow up this year.   Only recently started ozempic, had 2 doses.    Had been drinking 2L coke per day, d/w pt about tapering.    D/w pt about chantix, smoking less than prior. No ADE on med.  No side effects from med.  D/w pt about continued use.    PMH and SH reviewed  Meds, vitals, and allergies reviewed.   ROS: Per HPI unless specifically indicated in ROS section   GEN: nad, alert and oriented HEENT: ncat NECK: supple w/o LA CV: rrr. PULM: ctab, no inc wob ABD: soft, +bs EXT: no edema SKIN: well perfused.

## 2024-02-14 NOTE — Assessment & Plan Note (Addendum)
A1c sig higher.  Only recently started ozempic, had 2 doses.   Had been drinking 2L coke per day, d/w pt about tapering.    If tolerating 0.25 mg ozempic for 4 doses, then increase to 0.5mg  per dose.   See notes on labs .  Plan on recheck in 3 months but we'll make plans when I see her labs.   See AVS.

## 2024-02-14 NOTE — Assessment & Plan Note (Signed)
D/w pt about chantix, smoking less than prior. No ADE on med.  No side effects from med.  D/w pt about continued use.  She can update me as needed.

## 2024-02-16 ENCOUNTER — Ambulatory Visit: Payer: Self-pay | Admitting: Family Medicine

## 2024-02-16 NOTE — Telephone Encounter (Signed)
Info only call: Pt calling to see if PCP new her glucose was 330 from 2/18 labs. Pt is wanting to know what to do. Pt states she feels fine. RN informed pt that PCP is aware of glucose level but is waiting on Fructosamine lab to come in before deciding treatment.Pt  verbalized understanding and will be waiting for further instructions.           Copied from CRM [id number]. Topic: Clinical - Red Word Triage >> Feb 16, 2024  1:36 PM Drema Balzarine wrote: Red Word that prompted transfer to Nurse Triage: Patient blood sugar 330 Reason for Disposition  [1] Other NON-URGENT information for PCP AND [2] does not require PCP response  Answer Assessment - Initial Assessment Questions 1. REASON FOR CALL or QUESTION: "What is your reason for calling today?" or "How can I best help you?" or "What question do you have that I can help answer?"     Pt noticed lab results and wanted clarification since elevated.  Answer Assessment - Initial Assessment Questions 1. REASON FOR CALL or QUESTION: "What is your reason for calling today?" or "How can I best help you?" or "What question do you have that I can help answer?"    Does pcp know blood glucose is elevated  2. CALLER: Document the source of call. (e.g., laboratory, patient).     Pt on labs  Protocols used: Information Only Call - No Triage-A-AH, PCP Call - No Triage-A-AH

## 2024-02-18 LAB — FRUCTOSAMINE: Fructosamine: 477 umol/L — ABNORMAL HIGH (ref 205–285)

## 2024-02-18 NOTE — Telephone Encounter (Signed)
 See note on lab result

## 2024-02-22 ENCOUNTER — Telehealth: Payer: Self-pay

## 2024-02-22 NOTE — Telephone Encounter (Signed)
 Copied from CRM [id number]. Topic: General - Call Back - No Documentation >> Feb 22, 2024  4:08 PM Adaysia C wrote: Reason for CRM: Patient missed a call from [facility] and called in to return that call. The call attempt was not documented in the patients chart. Please follow up with patient because she would like to know what the call was regarding #[phone]

## 2024-02-23 NOTE — Telephone Encounter (Signed)
 See result notes for further communication.

## 2024-03-03 ENCOUNTER — Telehealth: Payer: Self-pay | Admitting: Family Medicine

## 2024-03-03 NOTE — Telephone Encounter (Signed)
 Please update patient- they will get a letter/mychart message about this.    The hospital system discovered a software error in a system at the laboratory at [facility] at [address] in [place] that examines kidney function. The patient's results were incorrectly calculated on this test.  The test is called uACR. It measures the amount of albumin (a protein) in the urine relative to creatinine (a waste product). This test is one of several factors used to judge kidney function. The laboratory software miscalculated the ratio of one to the other. The measurements themselves were correct. The software error has been fixed.  I went back and checked the patient's lab results and medicines.    At this point, it does not appear that the patient needs to make any medicine changes.  I would continue as planned from the last office visit and recheck her labs at a visit after 05/12/24.    If the patient has any questions or concerns about this, please schedule a visit/let me know.   Thanks.

## 2024-03-05 NOTE — Telephone Encounter (Signed)
 LMTCB

## 2024-03-06 DIAGNOSIS — F17203 Nicotine dependence unspecified, with withdrawal: Secondary | ICD-10-CM | POA: Diagnosis not present

## 2024-03-06 DIAGNOSIS — M549 Dorsalgia, unspecified: Secondary | ICD-10-CM | POA: Diagnosis not present

## 2024-03-06 DIAGNOSIS — M542 Cervicalgia: Secondary | ICD-10-CM | POA: Diagnosis not present

## 2024-03-06 DIAGNOSIS — G894 Chronic pain syndrome: Secondary | ICD-10-CM | POA: Diagnosis not present

## 2024-03-06 DIAGNOSIS — K5900 Constipation, unspecified: Secondary | ICD-10-CM | POA: Diagnosis not present

## 2024-03-06 DIAGNOSIS — Z79891 Long term (current) use of opiate analgesic: Secondary | ICD-10-CM | POA: Diagnosis not present

## 2024-03-06 DIAGNOSIS — M5416 Radiculopathy, lumbar region: Secondary | ICD-10-CM | POA: Diagnosis not present

## 2024-03-06 DIAGNOSIS — G4700 Insomnia, unspecified: Secondary | ICD-10-CM | POA: Diagnosis not present

## 2024-03-06 DIAGNOSIS — M25569 Pain in unspecified knee: Secondary | ICD-10-CM | POA: Diagnosis not present

## 2024-03-06 DIAGNOSIS — M5412 Radiculopathy, cervical region: Secondary | ICD-10-CM | POA: Diagnosis not present

## 2024-03-07 ENCOUNTER — Telehealth: Payer: Self-pay | Admitting: Family Medicine

## 2024-03-07 NOTE — Telephone Encounter (Signed)
 Spoke to pt, scheduled f/u for 05/14/24

## 2024-03-07 NOTE — Telephone Encounter (Signed)
 Copied from CRM [id number]. Topic: Clinical - Lab/Test Results >> Mar 07, 2024 11:34 AM Leslie Wong wrote: Reason for CRM: patient was advised " recheck her labs at a visit after 05/12/24." And would ike to go ahead & have it ordered and scheduled for an appointment in May

## 2024-04-16 ENCOUNTER — Other Ambulatory Visit: Payer: Self-pay | Admitting: Family Medicine

## 2024-05-02 DIAGNOSIS — Z79891 Long term (current) use of opiate analgesic: Secondary | ICD-10-CM | POA: Diagnosis not present

## 2024-05-02 DIAGNOSIS — M5416 Radiculopathy, lumbar region: Secondary | ICD-10-CM | POA: Diagnosis not present

## 2024-05-02 DIAGNOSIS — M549 Dorsalgia, unspecified: Secondary | ICD-10-CM | POA: Diagnosis not present

## 2024-05-02 DIAGNOSIS — G894 Chronic pain syndrome: Secondary | ICD-10-CM | POA: Diagnosis not present

## 2024-05-02 DIAGNOSIS — M5412 Radiculopathy, cervical region: Secondary | ICD-10-CM | POA: Diagnosis not present

## 2024-05-02 DIAGNOSIS — K5900 Constipation, unspecified: Secondary | ICD-10-CM | POA: Diagnosis not present

## 2024-05-02 DIAGNOSIS — G4700 Insomnia, unspecified: Secondary | ICD-10-CM | POA: Diagnosis not present

## 2024-05-02 DIAGNOSIS — M542 Cervicalgia: Secondary | ICD-10-CM | POA: Diagnosis not present

## 2024-05-02 DIAGNOSIS — F17203 Nicotine dependence unspecified, with withdrawal: Secondary | ICD-10-CM | POA: Diagnosis not present

## 2024-05-02 DIAGNOSIS — M25569 Pain in unspecified knee: Secondary | ICD-10-CM | POA: Diagnosis not present

## 2024-05-13 ENCOUNTER — Other Ambulatory Visit: Payer: Self-pay | Admitting: Family Medicine

## 2024-05-14 ENCOUNTER — Ambulatory Visit (INDEPENDENT_AMBULATORY_CARE_PROVIDER_SITE_OTHER): Admitting: Family Medicine

## 2024-05-14 ENCOUNTER — Encounter: Payer: Self-pay | Admitting: Family Medicine

## 2024-05-14 VITALS — BP 124/84 | HR 84 | Temp 98.5°F | Ht 59.0 in | Wt 130.2 lb

## 2024-05-14 DIAGNOSIS — E119 Type 2 diabetes mellitus without complications: Secondary | ICD-10-CM | POA: Diagnosis not present

## 2024-05-14 LAB — POCT GLYCOSYLATED HEMOGLOBIN (HGB A1C): Hemoglobin A1C: 6.4 % — AB (ref 4.0–5.6)

## 2024-05-14 MED ORDER — VARENICLINE TARTRATE 1 MG PO TABS: 1.0000 mg | ORAL_TABLET | Freq: Two times a day (BID) | ORAL | 4 refills | Status: DC

## 2024-05-14 MED ORDER — OMEPRAZOLE 40 MG PO CPDR: 40.0000 mg | DELAYED_RELEASE_CAPSULE | Freq: Every day | ORAL | 3 refills | Status: DC

## 2024-05-14 MED ORDER — OZEMPIC (0.25 OR 0.5 MG/DOSE) 2 MG/3ML ~~LOC~~ SOPN: PEN_INJECTOR | SUBCUTANEOUS | 6 refills | Status: AC

## 2024-05-14 NOTE — Patient Instructions (Addendum)
 Yearly visit in September.  Labs ahead of time if possible- we can do them at the visit if needed.   If you have trouble with GI upset or low sugars, then stop ozempic  and let me know.  Take care.  Glad to see you.

## 2024-05-14 NOTE — Progress Notes (Signed)
 Diabetes:  Using medications without difficulties: yes Hypoglycemic episodes: no Hyperglycemic episodes: no Feet problems:no Blood Sugars averaging: 100-130.   She is taking 0.25mg  ozempic  weekly.  A1c 13-----> 6.4.   She cut out a lot of soda and feels better with weight loss noted, down 8 lbs.    Her son is out of prison and living in a halfway house, working, d/w pt.   Her daughter had to have neck surgery.   Discussed.  She is tolerating chantix  w/o mood changes.  It helped with tapering smoking.    Meds, vitals, and allergies reviewed.  ROS: Per HPI unless specifically indicated in ROS section   GEN: nad, alert and oriented HEENT: mucous membranes moist NECK: supple w/o LA CV: rrr. PULM: ctab, no inc wob ABD: soft, +bs EXT: no edema SKIN: well perfused.

## 2024-05-15 NOTE — Assessment & Plan Note (Signed)
 She is taking 0.25mg  ozempic  weekly.  A1c 13-----> 6.4.   She cut out a lot of soda and feels better with weight loss noted, down 8 lbs.   No change in meds right now. Plan for yearly visit in September.  Labs ahead of time if possible- we can do them at the visit if needed.   If any trouble with GI upset or low sugars, then stop ozempic  and let me know.  She agrees with plan.  I thanked her for her effort.

## 2024-07-01 ENCOUNTER — Telehealth: Payer: Self-pay

## 2024-07-01 ENCOUNTER — Telehealth: Payer: Self-pay | Admitting: Family Medicine

## 2024-07-01 MED ORDER — FLUCONAZOLE 150 MG PO TABS: 150.0000 mg | ORAL_TABLET | Freq: Once | ORAL | 0 refills | Status: DC

## 2024-07-01 NOTE — Telephone Encounter (Signed)
 Copied from CRM [id number]. Topic: Clinical - Prescription Issue >> Jul 01, 2024  1:46 PM Corin V wrote: Reason for CRM: [facility] is no longer covering the One Touch Diabetes Meter or Test Strips. They provided the following options as alternatives. Please send a new script to [id number] - [place], [place] - [address]  Contour plus test strips and contour next test strips Contour plus blue meter Contour next generation meter Contour one meter Contour next easy meter  OR  Acucheck guide meter Acucheck guide test strips

## 2024-07-01 NOTE — Telephone Encounter (Signed)
 Had used fluconazole  (DIFLUCAN )  in the past.  Rx sent for 1 tab.  That should usually cover it.  Rec OV if not better with use.  Thanks.

## 2024-07-01 NOTE — Telephone Encounter (Signed)
 Called and reviewed information with patient. If not improved she will call to set up appointment. She wanted to let you know after August 1 the testing supplies will change what is covered by her insurance. Advised that when she is due for refill after that date to call and give information and we can send in for new supplies.

## 2024-07-01 NOTE — Telephone Encounter (Signed)
 Spoke to patient in another message. She will call back closer to change date and request new order sent for supplies.  No further action needed at this time.

## 2024-07-01 NOTE — Telephone Encounter (Signed)
 Copied from CRM [id number]. Topic: General - Other >> Jul 01, 2024 11:20 AM Gennette ORN wrote: Reason for CRM: Patient is requesting a medication clears up yeast infection she stated she was given this before. But doesn't remember the full spelling of the name.

## 2024-07-01 NOTE — Telephone Encounter (Signed)
 Copied from CRM [id number]. Topic: Clinical - Medication Question >> Jul 01, 2024 11:15 AM Chiquita SQUIBB wrote: Reason for CRM: Patient is requesting an antibiotic for a yeast infection. Call was dropped so was unable to get more information, tried to call patient back and the phone was not working.

## 2024-07-02 NOTE — Telephone Encounter (Signed)
Noted.  Will await update from patient.  

## 2024-07-03 ENCOUNTER — Other Ambulatory Visit: Payer: Self-pay | Admitting: Family Medicine

## 2024-07-03 DIAGNOSIS — Z8639 Personal history of other endocrine, nutritional and metabolic disease: Secondary | ICD-10-CM

## 2024-07-04 ENCOUNTER — Ambulatory Visit

## 2024-07-04 VITALS — BP 124/84 | Ht 59.0 in | Wt 133.0 lb

## 2024-07-04 DIAGNOSIS — Z2821 Immunization not carried out because of patient refusal: Secondary | ICD-10-CM | POA: Diagnosis not present

## 2024-07-04 DIAGNOSIS — Z0000 Encounter for general adult medical examination without abnormal findings: Secondary | ICD-10-CM | POA: Diagnosis not present

## 2024-07-04 NOTE — Patient Instructions (Signed)
 Ms. Leslie Wong , Thank you for taking time out of your busy schedule to complete your Annual Wellness Visit with me. I enjoyed our conversation and look forward to speaking with you again next year. I, as well as your care team,  appreciate your ongoing commitment to your health goals. Please review the following plan we discussed and let me know if I can assist you in the future. Your Game plan/ To Do List    Referrals: If you haven't heard from the office you've been referred to, please reach out to them at the phone provided.  none Follow up Visits: Next Medicare AWV with our clinical staff: 07/08/2025   Have you seen your provider in the last 6 months (3 months if uncontrolled diabetes)? Yes Next Office Visit with your provider: 08/29/2024  Clinician Recommendations:  Aim for 30 minutes of exercise or brisk walking, 6-8 glasses of water, and 5 servings of fruits and vegetables each day.       This is a list of the screening recommended for you and due dates:  Health Maintenance  Topic Date Due   Eye exam for diabetics  Never done   Yearly kidney health urinalysis for diabetes  Never done   Zoster (Shingles) Vaccine (1 of 2) Never done   Medicare Annual Wellness Visit  08/07/2024   Flu Shot  07/26/2024   Complete foot exam   08/07/2024   Screening for Lung Cancer  09/03/2024   Hemoglobin A1C  11/14/2024   Yearly kidney function blood test for diabetes  02/12/2025   Mammogram  09/17/2025   DTaP/Tdap/Td vaccine (4 - Td or Tdap) 06/23/2027   Colon Cancer Screening  09/08/2027   Pneumococcal Vaccine for age over 73  Completed   DEXA scan (bone density measurement)  Completed   Hepatitis C Screening  Completed   HIV Screening  Completed   Hepatitis B Vaccine  Aged Out   HPV Vaccine  Aged Out   Meningitis B Vaccine  Aged Out   COVID-19 Vaccine  Discontinued    Advanced directives: (Declined) Advance directive discussed with you today. Even though you declined this today, please call  our office should you change your mind, and we can give you the proper paperwork for you to fill out. Advance Care Planning is important because it:  [x]  Makes sure you receive the medical care that is consistent with your values, goals, and preferences  [x]  It provides guidance to your family and loved ones and reduces their decisional burden about whether or not they are making the right decisions based on your wishes.  Follow the link provided in your after visit summary or read over the paperwork we have mailed to you to help you started getting your Advance Directives in place. If you need assistance in completing these, please reach out to us  so that we can help you!  See attachments for Preventive Care and Fall Prevention Tips.

## 2024-07-04 NOTE — Progress Notes (Signed)
 Because this visit was a virtual/telehealth visit,  certain criteria was not obtained, such a blood pressure, CBG if applicable, and timed get up and go. Any medications not marked as taking were not mentioned during the medication reconciliation part of the visit. Any vitals not documented were not able to be obtained due to this being a telehealth visit or patient was unable to self-report a recent blood pressure reading due to a lack of equipment at home via telehealth. Vitals that have been documented are verbally provided by the patient.  This visit was performed by a medical professional under my direct supervision. I was immediately available for consultation/collaboration. I have reviewed and agree with the Annual Wellness Visit documentation.  Subjective:   Leslie Wong is a 68 y.o. who presents for a Medicare Wellness preventive visit.  As a reminder, Annual Wellness Visits don't include a physical exam, and some assessments may be limited, especially if this visit is performed virtually. We may recommend an in-person follow-up visit with your provider if needed.  Visit Complete: Virtual I connected with  Leslie Wong on 07/04/24 by a audio enabled telemedicine application and verified that I am speaking with the correct person using two identifiers.  Patient Location: Home  Provider Location: Home Office  I discussed the limitations of evaluation and management by telemedicine. The patient expressed understanding and agreed to proceed.  Vital Signs: Because this visit was a virtual/telehealth visit, some criteria may be missing or patient reported. Any vitals not documented were not able to be obtained and vitals that have been documented are patient reported.  VideoDeclined- This patient declined Librarian, academic. Therefore the visit was completed with audio only.  Persons Participating in Visit: Patient.  AWV Questionnaire:  No: Patient Medicare AWV questionnaire was not completed prior to this visit.  Cardiac Risk Factors include: advanced age (>51men, >8 women);diabetes mellitus     Objective:    Today's Vitals   07/04/24 1116  BP: 124/84  Weight: 133 lb (60.3 kg)  Height: 4' 11 (1.499 m)  PainSc: 7    Body mass index is 26.86 kg/m.     07/04/2024   11:15 AM 05/11/2022    9:33 AM 03/12/2022    1:46 PM 11/08/2021   10:17 AM 10/25/2021    9:15 AM 10/04/2021    8:38 AM 05/26/2021    1:30 AM  Advanced Directives  Does Patient Have a Medical Advance Directive? No No Yes No No No   Type of Advance Directive   Healthcare Power of Attorney      Copy of Healthcare Power of Attorney in Chart?   --      Would patient like information on creating a medical advance directive? No - Patient declined No - Patient declined No - Patient declined No - Patient declined No - Patient declined Yes (MAU/Ambulatory/Procedural Areas - Information given) No - Patient declined    Current Medications (verified) Outpatient Encounter Medications as of 07/04/2024  Medication Sig   albuterol  (VENTOLIN  HFA) 108 (90 Base) MCG/ACT inhaler Inhale 2 puffs into the lungs every 6 (six) hours as needed for wheezing or shortness of breath.   Blood Glucose Monitoring Suppl DEVI 1 each by Does not apply route in the morning, at noon, and at bedtime. May substitute to any manufacturer covered by patient's insurance.   budesonide -formoterol  (SYMBICORT ) 160-4.5 MCG/ACT inhaler Inhale 2 puffs into the lungs in the morning and at bedtime. Bid   buPROPion  (WELLBUTRIN  XL)  150 MG 24 hr tablet Take 150 mg by mouth every morning.   busPIRone (BUSPAR) 7.5 MG tablet Take 1 tablet by mouth daily.   Cholecalciferol  25 MCG (1000 UT) capsule Take 1,000 Units by mouth daily.   clonazePAM (KLONOPIN) 0.5 MG tablet Take 0.5 mg by mouth daily as needed.   fluticasone  (FLONASE ) 50 MCG/ACT nasal spray SHAKE LIQUID AND USE 2 SPRAYS IN EACH NOSTRIL DAILY    hydrOXYzine  (ATARAX /VISTARIL ) 50 MG tablet Take 1 tablet (50 mg total) by mouth 3 (three) times daily as needed for anxiety.   ipratropium-albuterol  (DUONEB) 0.5-2.5 (3) MG/3ML SOLN Take 3 mLs by nebulization every 4 (four) hours as needed.   levothyroxine  (SYNTHROID ) 88 MCG tablet TAKE 1 TABLET(88 MCG) BY MOUTH DAILY BEFORE BREAKFAST   magic mouthwash (nystatin , lidocaine , diphenhydrAMINE , alum & mag hydroxide) suspension Swish and swallow 5 mLs 4 (four) times daily as needed for mouth pain.   mirtazapine  (REMERON ) 30 MG tablet Take 30 mg by mouth at bedtime.   Multiple Vitamins-Minerals (MULTIVITAMIN ADULTS PO) Take by mouth.   naloxone (NARCAN) nasal spray 4 mg/0.1 mL Place 1 spray into the nose once.   NONFORMULARY OR COMPOUNDED ITEM Mix the following: 45 ml of nystatin   45 ml lidocaine   45 ml benadryl  liquid  45 ml antacid   Take 5 mL by mouth 3 times a day as needed for mouth pain.  Dispense 180 mL with 1 refill.   OLANZapine  (ZYPREXA ) 15 MG tablet Take by mouth.   Omega-3 Fatty Acids (FISH OIL ) 1000 MG CAPS Take by mouth daily.   omeprazole  (PRILOSEC) 40 MG capsule Take 1 capsule (40 mg total) by mouth daily.   ondansetron  (ZOFRAN -ODT) 8 MG disintegrating tablet Take 8 mg by mouth 2 (two) times daily.   Oxybutynin Chloride (GELNIQUE) 10 % GEL Place onto the skin.   Oxycodone  HCl 10 MG TABS Take 10 mg by mouth every 6 (six) hours as needed for pain.   Semaglutide ,0.25 or 0.5MG /DOS, (OZEMPIC , 0.25 OR 0.5 MG/DOSE,) 2 MG/3ML SOPN INJECT 0.25 MG UNDER THE SKIN WEEKLY   Spacer/Aero-Holding Chambers (AEROCHAMBER MV) inhaler Use as instructed   STIMULANT LAXATIVE 8.6-50 MG tablet Take 2 tablets by mouth at bedtime as needed.   Tiotropium Bromide Monohydrate  (SPIRIVA  RESPIMAT) 2.5 MCG/ACT AERS Inhale 2 puffs into the lungs daily.   tiZANidine  (ZANAFLEX ) 4 MG tablet Take by mouth.   triamcinolone  cream (KENALOG ) 0.1 % Apply 1 Application topically 2 (two) times daily as needed. For cracked skin  on the hands.   valACYclovir  (VALTREX ) 500 MG tablet Take 1 tablet (500 mg total) by mouth daily.   varenicline  (CHANTIX ) 1 MG tablet Take 1 tablet (1 mg total) by mouth 2 (two) times daily.   XTAMPZA  ER 9 MG C12A Take 2 capsules by mouth at bedtime.   No facility-administered encounter medications on file as of 07/04/2024.    Allergies (verified) Doxepin, Valproic acid, Amitriptyline, Aspirin, Atorvastatin , Baclofen, Buprenorphine, Duloxetine, Erythromycin, Gabapentin , Metoclopramide hcl, Nortriptyline, Nsaids, Nucynta  er [tapentadol  hcl er], Nucynta  [tapentadol ], Prednisone , Pregabalin , Seroquel [quetiapine fumerate], Sulfa antibiotics, Topamax , and Vicodin [hydrocodone -acetaminophen ]   History: Past Medical History:  Diagnosis Date   Back pain    Cancer (HCC)    skin   COPD (chronic obstructive pulmonary disease) (HCC)    Depression    BAD   Gastroparesis    GERD (gastroesophageal reflux disease)    HOH (hard of hearing)    Hypothyroidism    Insomnia    Migraines  Motion sickness    Parkinson disease (HCC)    per [facility], dx'd 2019   Recurrent cold sores    Shoulder bursitis    Urge incontinence    Wears dentures    full upper and lower   Past Surgical History:  Procedure Laterality Date   ABDOMINAL HYSTERECTOMY  12/26/1997   total   APPENDECTOMY     CARPAL TUNNEL RELEASE Right    and tendon repair   CATARACT EXTRACTION W/PHACO Left 10/04/2021   Procedure: CATARACT EXTRACTION PHACO AND INTRAOCULAR LENS PLACEMENT (IOC) LEFT 9.46 00:48.0;  Surgeon: Myrna Adine Anes, MD;  Location: [facility];  Service: Ophthalmology;  Laterality: Left;  requests early   CATARACT EXTRACTION W/PHACO Right 11/08/2021   Procedure: CATARACT EXTRACTION PHACO AND INTRAOCULAR LENS PLACEMENT (IOC) RIGHT;  Surgeon: Myrna Adine Anes, MD;  Location: [facility];  Service: Ophthalmology;  Laterality: Right;  2.37 00:26.0   COLONOSCOPY WITH PROPOFOL  N/A 09/07/2022    Procedure: COLONOSCOPY WITH PROPOFOL ;  Surgeon: Therisa Bi, MD;  Location: [facility];  Service: Gastroenterology;  Laterality: N/A;   ESOPHAGOGASTRODUODENOSCOPY  01/26/2006   negative except small hiatal hernia   ESOPHAGOGASTRODUODENOSCOPY  02/24/2015   See report   ESOPHAGOGASTRODUODENOSCOPY N/A 09/07/2022   Procedure: ESOPHAGOGASTRODUODENOSCOPY (EGD);  Surgeon: Therisa Bi, MD;  Location: [facility];  Service: Gastroenterology;  Laterality: N/A;   LAPAROSCOPY     for endometriosis   OVARIAN CYST REMOVAL  12/26/1988   unilateral   TONSILLECTOMY AND ADENOIDECTOMY     Family History  Problem Relation Age of Onset   Hypertension Mother    Arthritis Mother    Diabetes Mother    Stroke Mother    Cancer Father        lung   Colon cancer Neg Hx    Breast cancer Neg Hx    Social History   Socioeconomic History   Marital status: Single    Spouse name: Not on file   Number of children: Not on file   Years of education: Not on file   Highest education level: Not on file  Occupational History   Not on file  Tobacco Use   Smoking status: Every Day    Current packs/day: 2.00    Average packs/day: 2.0 packs/day for 44.0 years (88.0 ttl pk-yrs)    Types: Cigarettes, E-cigarettes   Smokeless tobacco: Former   Tobacco comments:    0.5PPD 10/11/2023  Vaping Use   Vaping status: Former  Substance and Sexual Activity   Alcohol use: No    Alcohol/week: 0.0 standard drinks of alcohol   Drug use: No   Sexual activity: Never  Other Topics Concern   Not on file  Social History Narrative   Lives alone.     Has a dog names Dixie   Social Drivers of Health   Financial Resource Strain: Low Risk  (07/04/2024)   Overall Financial Resource Strain (CARDIA)    Difficulty of Paying Living Expenses: Not hard at all  Food Insecurity: No Food Insecurity (07/04/2024)   Hunger Vital Sign    Worried About Running Out of Food in the Last Year: Never true    Ran Out of Food in the Last  Year: Never true  Transportation Needs: No Transportation Needs (07/04/2024)   PRAPARE - Administrator, Civil Service (Medical): No    Lack of Transportation (Non-Medical): No  Physical Activity: Sufficiently Active (07/04/2024)   Exercise Vital Sign    Days of Exercise per  Week: 6 days    Minutes of Exercise per Session: 40 min  Stress: No Stress Concern Present (07/04/2024)   [employer] of Occupational Health - Occupational Stress Questionnaire    Feeling of Stress: Not at all  Social Connections: Socially Isolated (07/04/2024)   Social Connection and Isolation Panel    Frequency of Communication with Friends and Family: More than three times a week    Frequency of Social Gatherings with Friends and Family: More than three times a week    Attends Religious Services: Never    Database administrator or Organizations: No    Attends Banker Meetings: Never    Marital Status: Divorced    Tobacco Counseling Ready to quit: Not Answered Counseling given: Not Answered Tobacco comments: 0.5PPD 10/11/2023    Clinical Intake:  Pre-visit preparation completed: Yes  Pain : 0-10 Pain Score: 7  Pain Type: Chronic pain Pain Location: Other (Comment) (everyday all over) Pain Onset: Today Pain Frequency: Constant     BMI - recorded: 26.56 Nutritional Risks: None Diabetes: Yes CBG done?: No Did pt. bring in CBG monitor from home?: No  Lab Results  Component Value Date   HGBA1C 6.4 (A) 05/14/2024   HGBA1C 13.0 (A) 02/13/2024   HGBA1C 9.2 (A) 11/13/2023     How often do you need to have someone help you when you read instructions, pamphlets, or other written materials from your doctor or pharmacy?: 1 - Never What is the last grade level you completed in school?: some college  Interpreter Needed?: No  Information entered by :: Leslie Wong,cma   Activities of Daily Living     07/04/2024   11:19 AM  In your present state of health, do you  have any difficulty performing the following activities:  Hearing? 0  Vision? 0  Difficulty concentrating or making decisions? 0  Walking or climbing stairs? 0  Dressing or bathing? 0  Doing errands, shopping? 0  Preparing Food and eating ? N  Using the Toilet? N  In the past six months, have you accidently leaked urine? N  Do you have problems with loss of bowel control? N  Managing your Medications? N  Managing your Finances? N  Housekeeping or managing your Housekeeping? N    Patient Care Team: Cleatus Arlyss RAMAN, MD as PCP - General (Family Medicine) Myrna Adine Anes, MD as Consulting Physician (Ophthalmology) Isadora Hose, MD as Consulting Physician (Pulmonary Disease)  I have updated your Care Teams any recent Medical Services you may have received from other providers in the past year.     Assessment:   This is a routine wellness examination for Leslie Wong.  Hearing/Vision screen Hearing Screening - Comments:: Patient has some hearing aids  Vision Screening - Comments:: Patient wears glasses    Goals Addressed               This Visit's Progress     Patient Stated (pt-stated)        Patient would like to not have diabetes        Depression Screen     07/04/2024   11:21 AM 05/14/2024   10:52 AM 02/13/2024   12:15 PM 11/13/2023   10:57 AM 05/25/2023   11:03 AM 04/18/2023   11:22 AM 01/19/2023   12:12 PM  PHQ 2/9 Scores  PHQ - 2 Score 0 2 3 4 6 3 2   PHQ- 9 Score 0 5 10 11 19 8 4     Fall Risk  07/04/2024   11:19 AM 05/14/2024   10:52 AM 02/13/2024   12:15 PM 11/13/2023   10:56 AM 05/25/2023   11:03 AM  Fall Risk   Falls in the past year? 0 0 0 0 0  Number falls in past yr: 0 0 0 0 0  Injury with Fall? 0 0 0 0 0  Risk for fall due to : No Fall Risks No Fall Risks No Fall Risks No Fall Risks No Fall Risks  Follow up Falls evaluation completed Falls evaluation completed  Falls evaluation completed Falls evaluation completed    MEDICARE RISK AT HOME:   Medicare Risk at Home Any stairs in or around the home?: No If so, are there any without handrails?: No Home free of loose throw rugs in walkways, pet beds, electrical cords, etc?: Yes Adequate lighting in your home to reduce risk of falls?: Yes Life alert?: No Use of a cane, walker or w/c?: No Grab bars in the bathroom?: Yes Shower chair or bench in shower?: Yes Elevated toilet seat or a handicapped toilet?: No  TIMED UP AND GO:  Was the test performed?  No  Cognitive Function: 6CIT completed    06/18/2018   12:48 PM 06/16/2017    2:29 PM  MMSE - Mini Mental State Exam  Orientation to time 5 5   Orientation to Place 5 5   Registration 3 3   Attention/ Calculation 0 0   Recall 3 3   Language- name 2 objects 0 0   Language- repeat 1 1  Language- follow 3 step command 3 3   Language- read & follow direction 0 0   Write a sentence 0 0   Copy design 0 0   Total score 20 20      Data saved with a previous flowsheet row definition        07/04/2024   11:18 AM  6CIT Screen  What Year? 0 points  What month? 0 points  What time? 0 points  Count back from 20 0 points  Months in reverse 0 points  Repeat phrase 0 points  Total Score 0 points    Immunizations Immunization History  Administered Date(s) Administered   Influenza Whole 09/17/2010   Influenza, Seasonal, Injecte, Preservative Fre 10/11/2023   Influenza,inj,Quad PF,6+ Mos 09/13/2018, 10/14/2022   Influenza-Unspecified 09/25/2016, 09/13/2018   PNEUMOCOCCAL CONJUGATE-20 06/17/2021   PPD Test 05/29/2019   Td 12/26/1994, 10/31/2007   Tdap 06/22/2017    Screening Tests Health Maintenance  Topic Date Due   OPHTHALMOLOGY EXAM  Never done   Diabetic kidney evaluation - Urine ACR  Never done   Zoster Vaccines- Shingrix (1 of 2) Never done   INFLUENZA VACCINE  07/26/2024   FOOT EXAM  08/07/2024   Lung Cancer Screening  09/03/2024   HEMOGLOBIN A1C  11/14/2024   Diabetic kidney evaluation - eGFR measurement   02/12/2025   Medicare Annual Wellness (AWV)  07/04/2025   MAMMOGRAM  09/17/2025   DTaP/Tdap/Td (4 - Td or Tdap) 06/23/2027   Colonoscopy  09/08/2027   Pneumococcal Vaccine: 50+ Years  Completed   DEXA SCAN  Completed   Hepatitis C Screening  Completed   HIV Screening  Completed   Hepatitis B Vaccines  Aged Out   HPV VACCINES  Aged Out   Meningococcal B Vaccine  Aged Out   COVID-19 Vaccine  Discontinued    Health Maintenance  Health Maintenance Due  Topic Date Due   OPHTHALMOLOGY EXAM  Never done  Diabetic kidney evaluation - Urine ACR  Never done   Zoster Vaccines- Shingrix (1 of 2) Never done   Health Maintenance Items Addressed:   Additional Screening:  Vision Screening: Recommended annual ophthalmology exams for early detection of glaucoma and other disorders of the eye. Would you like a referral to an eye doctor? No    Dental Screening: Recommended annual dental exams for proper oral hygiene  Community Resource Referral / Chronic Care Management: CRR required this visit?  No   CCM required this visit?  No   Plan:    I have personally reviewed and noted the following in the patient's chart:   Medical and social history Use of alcohol, tobacco or illicit drugs  Current medications and supplements including opioid prescriptions. Patient is currently taking opioid prescriptions. Information provided to patient regarding non-opioid alternatives. Patient advised to discuss non-opioid treatment plan with their provider. Functional ability and status Nutritional status Physical activity Advanced directives List of other physicians Hospitalizations, surgeries, and ER visits in previous 12 months Vitals Screenings to include cognitive, depression, and falls Referrals and appointments  In addition, I have reviewed and discussed with patient certain preventive protocols, quality metrics, and best practice recommendations. A written personalized care plan for  preventive services as well as general preventive health recommendations were provided to patient.   Lyle MARLA Right, [place]   07/04/2024   After Visit Summary: (MyChart) Due to this being a telephonic visit, the after visit summary with patients personalized plan was offered to patient via MyChart   Notes: Nothing significant to report at this time.

## 2024-07-11 DIAGNOSIS — K5900 Constipation, unspecified: Secondary | ICD-10-CM | POA: Diagnosis not present

## 2024-07-11 DIAGNOSIS — M5412 Radiculopathy, cervical region: Secondary | ICD-10-CM | POA: Diagnosis not present

## 2024-07-11 DIAGNOSIS — G4700 Insomnia, unspecified: Secondary | ICD-10-CM | POA: Diagnosis not present

## 2024-07-11 DIAGNOSIS — Z79891 Long term (current) use of opiate analgesic: Secondary | ICD-10-CM | POA: Diagnosis not present

## 2024-07-11 DIAGNOSIS — F17203 Nicotine dependence unspecified, with withdrawal: Secondary | ICD-10-CM | POA: Diagnosis not present

## 2024-07-11 DIAGNOSIS — M542 Cervicalgia: Secondary | ICD-10-CM | POA: Diagnosis not present

## 2024-07-11 DIAGNOSIS — G894 Chronic pain syndrome: Secondary | ICD-10-CM | POA: Diagnosis not present

## 2024-07-11 DIAGNOSIS — M25569 Pain in unspecified knee: Secondary | ICD-10-CM | POA: Diagnosis not present

## 2024-07-11 DIAGNOSIS — M549 Dorsalgia, unspecified: Secondary | ICD-10-CM | POA: Diagnosis not present

## 2024-07-11 DIAGNOSIS — M5416 Radiculopathy, lumbar region: Secondary | ICD-10-CM | POA: Diagnosis not present

## 2024-07-22 ENCOUNTER — Other Ambulatory Visit: Payer: Self-pay | Admitting: Family Medicine

## 2024-07-22 DIAGNOSIS — Z8639 Personal history of other endocrine, nutritional and metabolic disease: Secondary | ICD-10-CM

## 2024-07-29 ENCOUNTER — Other Ambulatory Visit: Payer: Self-pay | Admitting: Family Medicine

## 2024-07-30 ENCOUNTER — Telehealth: Payer: Self-pay

## 2024-07-30 ENCOUNTER — Other Ambulatory Visit: Payer: Self-pay

## 2024-07-30 MED ORDER — CONTOUR NEXT GEN MONITOR W/DEVICE KIT: 1.0000 | PACK | Freq: Two times a day (BID) | 0 refills | Status: DC

## 2024-07-30 NOTE — Telephone Encounter (Signed)
Advised patient that prescription has been sent.

## 2024-07-30 NOTE — Telephone Encounter (Signed)
 Copied from CRM [id number]. Topic: Clinical - Prescription Issue >> Jul 29, 2024  2:20 PM Leslie Wong wrote: Reason for CRM: Patient called in stating insurance doesn't cover one touch anymore and is now needing a prescription for Contour accucheck instead

## 2024-08-12 ENCOUNTER — Other Ambulatory Visit: Payer: Self-pay | Admitting: Family Medicine

## 2024-08-12 NOTE — Telephone Encounter (Unsigned)
 Copied from CRM #[id number]. Topic: Clinical - Medication Refill >> Aug 12, 2024  2:15 PM Drema MATSU wrote: Medication: magic mouthwash (nystatin , lidocaine , diphenhydrAMINE , alum & mag hydroxide) suspension  Has the patient contacted their pharmacy? Yes (Agent: If no, request that the patient contact the pharmacy for the refill. If patient does not wish to contact the pharmacy document the reason why and proceed with request.) advised that they faxed refill to provider (Agent: If yes, when and what did the pharmacy advise?)  This is the patient's preferred pharmacy:   [id number] [place] - [address] AT [facility] [address] [place] [address] Phone: [phone] Fax: [phone]  Is this the correct pharmacy for this prescription? Yes If no, delete pharmacy and type the correct one.   Has the prescription been filled recently? No  Is the patient out of the medication? Yes  Has the patient been seen for an appointment in the last year OR does the patient have an upcoming appointment? Yes  Can we respond through MyChart? Yes  Agent: Please be advised that Rx refills may take up to 3 business days. We ask that you follow-up with your pharmacy.

## 2024-08-14 MED ORDER — NYSTATIN 100000 UNIT/ML MT SUSP: 5.0000 mL | Freq: Four times a day (QID) | OROMUCOSAL | 1 refills | Status: DC | PRN

## 2024-08-14 NOTE — Telephone Encounter (Signed)
 Sent. Thanks.

## 2024-08-16 ENCOUNTER — Other Ambulatory Visit: Payer: Self-pay | Admitting: Family Medicine

## 2024-08-16 DIAGNOSIS — Z1231 Encounter for screening mammogram for malignant neoplasm of breast: Secondary | ICD-10-CM

## 2024-08-20 ENCOUNTER — Other Ambulatory Visit: Payer: Self-pay | Admitting: Family Medicine

## 2024-08-21 NOTE — Telephone Encounter (Signed)
 Patient is calling back regarding the contour stating that there was no test strips in the box and is need of the,.

## 2024-08-22 ENCOUNTER — Telehealth: Payer: Self-pay | Admitting: Family Medicine

## 2024-08-22 DIAGNOSIS — E119 Type 2 diabetes mellitus without complications: Secondary | ICD-10-CM

## 2024-08-22 NOTE — Telephone Encounter (Signed)
 Copied from CRM [id number]. Topic: Clinical - Prescription Issue >> Aug 22, 2024  4:26 PM Dedra B wrote: Reason for CRM: Pt called in saying that her provider ordered her a new glucose monitor but didn't prescribe her any strips.

## 2024-08-23 NOTE — Telephone Encounter (Signed)
 Please send order for strips, to check sugar daily as needed.  Dx. E11.9.  thanks.

## 2024-08-26 ENCOUNTER — Other Ambulatory Visit: Payer: Self-pay | Admitting: Family Medicine

## 2024-08-27 MED ORDER — BLOOD GLUCOSE TEST VI STRP
1.0000 | ORAL_STRIP | Freq: Three times a day (TID) | 5 refills | Status: AC
Start: 2024-08-27 — End: ?

## 2024-08-27 NOTE — Telephone Encounter (Signed)
Test strips have been sent 

## 2024-08-29 ENCOUNTER — Encounter: Payer: Self-pay | Admitting: Family Medicine

## 2024-08-29 ENCOUNTER — Ambulatory Visit: Admitting: Family Medicine

## 2024-08-29 VITALS — BP 124/68 | HR 78 | Temp 98.7°F | Ht 60.16 in | Wt 132.4 lb

## 2024-08-29 DIAGNOSIS — K1379 Other lesions of oral mucosa: Secondary | ICD-10-CM

## 2024-08-29 DIAGNOSIS — K219 Gastro-esophageal reflux disease without esophagitis: Secondary | ICD-10-CM | POA: Diagnosis not present

## 2024-08-29 DIAGNOSIS — Z7985 Long-term (current) use of injectable non-insulin antidiabetic drugs: Secondary | ICD-10-CM | POA: Diagnosis not present

## 2024-08-29 DIAGNOSIS — E559 Vitamin D deficiency, unspecified: Secondary | ICD-10-CM | POA: Diagnosis not present

## 2024-08-29 DIAGNOSIS — E039 Hypothyroidism, unspecified: Secondary | ICD-10-CM

## 2024-08-29 DIAGNOSIS — K449 Diaphragmatic hernia without obstruction or gangrene: Secondary | ICD-10-CM | POA: Diagnosis not present

## 2024-08-29 DIAGNOSIS — G720 Drug-induced myopathy: Secondary | ICD-10-CM

## 2024-08-29 DIAGNOSIS — E119 Type 2 diabetes mellitus without complications: Secondary | ICD-10-CM

## 2024-08-29 DIAGNOSIS — Z0000 Encounter for general adult medical examination without abnormal findings: Secondary | ICD-10-CM

## 2024-08-29 DIAGNOSIS — Z7189 Other specified counseling: Secondary | ICD-10-CM

## 2024-08-29 DIAGNOSIS — F319 Bipolar disorder, unspecified: Secondary | ICD-10-CM

## 2024-08-29 LAB — CBC WITH DIFFERENTIAL/PLATELET
Basophils Absolute: 0 K/uL (ref 0.0–0.1)
Basophils Relative: 0.4 % (ref 0.0–3.0)
Eosinophils Absolute: 0.1 K/uL (ref 0.0–0.7)
Eosinophils Relative: 1.2 % (ref 0.0–5.0)
HCT: 48.3 % — ABNORMAL HIGH (ref 36.0–46.0)
Hemoglobin: 16.4 g/dL — ABNORMAL HIGH (ref 12.0–15.0)
Lymphocytes Relative: 26.6 % (ref 12.0–46.0)
Lymphs Abs: 3 K/uL (ref 0.7–4.0)
MCHC: 33.9 g/dL (ref 30.0–36.0)
MCV: 99.1 fl (ref 78.0–100.0)
Monocytes Absolute: 0.9 K/uL (ref 0.1–1.0)
Monocytes Relative: 8.5 % (ref 3.0–12.0)
Neutro Abs: 7.1 K/uL (ref 1.4–7.7)
Neutrophils Relative %: 63.3 % (ref 43.0–77.0)
Platelets: 258 K/uL (ref 150.0–400.0)
RBC: 4.87 Mil/uL (ref 3.87–5.11)
RDW: 13.5 % (ref 11.5–15.5)
WBC: 11.2 K/uL — ABNORMAL HIGH (ref 4.0–10.5)

## 2024-08-29 LAB — COMPREHENSIVE METABOLIC PANEL WITH GFR
ALT: 19 U/L (ref 0–35)
AST: 19 U/L (ref 0–37)
Albumin: 4.3 g/dL (ref 3.5–5.2)
Alkaline Phosphatase: 89 U/L (ref 39–117)
BUN: 13 mg/dL (ref 6–23)
CO2: 31 meq/L (ref 19–32)
Calcium: 10.3 mg/dL (ref 8.4–10.5)
Chloride: 102 meq/L (ref 96–112)
Creatinine, Ser: 1.02 mg/dL (ref 0.40–1.20)
GFR: 57.64 mL/min — ABNORMAL LOW (ref >60.00)
Glucose, Bld: 113 mg/dL — ABNORMAL HIGH (ref 70–99)
Potassium: 4.1 meq/L (ref 3.5–5.1)
Sodium: 140 meq/L (ref 135–145)
Total Bilirubin: 0.4 mg/dL (ref 0.2–1.2)
Total Protein: 6.9 g/dL (ref 6.0–8.3)

## 2024-08-29 LAB — LIPID PANEL
Cholesterol: 213 mg/dL — ABNORMAL HIGH (ref 0–200)
HDL: 31.2 mg/dL — ABNORMAL LOW (ref >39.00)
LDL Cholesterol: 109 mg/dL — ABNORMAL HIGH (ref 0–99)
NonHDL: 181.82
Total CHOL/HDL Ratio: 7
Triglycerides: 366 mg/dL — ABNORMAL HIGH (ref 0.0–149.0)
VLDL: 73.2 mg/dL — ABNORMAL HIGH (ref 0.0–40.0)

## 2024-08-29 LAB — TSH: TSH: 2.56 u[IU]/mL (ref 0.35–5.50)

## 2024-08-29 LAB — HEMOGLOBIN A1C: Hgb A1c MFr Bld: 6.4 % (ref 4.6–6.5)

## 2024-08-29 LAB — MICROALBUMIN / CREATININE URINE RATIO
Creatinine,U: 146 mg/dL
Microalb Creat Ratio: 56.9 mg/g — ABNORMAL HIGH (ref 0.0–30.0)
Microalb, Ur: 8.3 mg/dL — ABNORMAL HIGH (ref 0.0–1.9)

## 2024-08-29 LAB — VITAMIN D 25 HYDROXY (VIT D DEFICIENCY, FRACTURES): VITD: 28.54 ng/mL — ABNORMAL LOW (ref 30.00–100.00)

## 2024-08-29 MED ORDER — OMEPRAZOLE 40 MG PO CPDR: 40.0000 mg | DELAYED_RELEASE_CAPSULE | Freq: Every day | ORAL | 3 refills | Status: AC

## 2024-08-29 MED ORDER — NYSTATIN 100000 UNIT/ML MT SUSP: 5.0000 mL | Freq: Four times a day (QID) | OROMUCOSAL | 1 refills | Status: AC | PRN

## 2024-08-29 MED ORDER — LEVOTHYROXINE SODIUM 88 MCG PO TABS: 88.0000 ug | ORAL_TABLET | Freq: Every day | ORAL | 3 refills | Status: AC

## 2024-08-29 NOTE — Patient Instructions (Addendum)
 Go to the lab on the way out.   If you have mychart we'll likely use that to update you.    Take care.  Glad to see you. Plan on recheck in about 6 months.  A1c at the visit.  If you have more trouble with vomting, then let me know.  You may need to stop ozempic .

## 2024-08-29 NOTE — Progress Notes (Signed)
 Diabetes:  Using medications without difficulties: yes. Taking 0.25mg  semaglutide .   She cut out soda.   Hypoglycemic episodes:no Hyperglycemic episodes:no Feet problems: yes Blood Sugars averaging:usually 100-150.  eye exam within last year: saw Arvella Novak in [place] in 12/2023.   Statin intolerant.    She is still seeing [facility].  Mood d/w pt.  Her has had legal concerns, d/w pt.  Her dog died this year.  Discussed.  No SI/HI.  Compliant with meds.   She had occ vomiting that predates ozempic  use, d/w pt.  Used zofran  prn.  Not escalating.  Benign abd exam today.  Routine cautions d/w pt.    H/o elevated calcium , recheck labs pending.   GERD controlled with PPI w/o :ADE on med.  Compliant.   Smoking cessation d/w pt.  Still smoking some, no mood changes on med.  Chantix  helped some with tapering.   She had used magic mouthwash prn for mouth pain with relief.  She couldn't get it filled, d/w pt.  Rx printed today.  I asked her to update me as needed.    Hypothyroidism.  Compliant.  Labs pending.  No ADE on med.  Swallowing well.  No neck mass.    Flu discussed for the fall.   Shingles discussed.   PNA. Prev done.  Tetanus 2018. Routine vaccines d/w pt.   Colonoscopy 2023.   Mammogram pending 2025.  Advance directive-daughter designated patient were incapacitated  She is enrolled in lung cancer screening program.   PMH and SH reviewed  Meds, vitals, and allergies reviewed.   ROS: Per HPI unless specifically indicated in ROS section   GEN: nad, alert and oriented HEENT: mucous membranes moist NECK: supple w/o LA CV: rrr. PULM: ctab, no inc wob ABD: soft, +bs EXT: no edema SKIN: no acute rash  Diabetic foot exam: Normal inspection No skin breakdown No calluses  Normal DP pulses Normal sensation to light touch and monofilament Nails normal

## 2024-09-01 DIAGNOSIS — K1379 Other lesions of oral mucosa: Secondary | ICD-10-CM | POA: Insufficient documentation

## 2024-09-01 DIAGNOSIS — G720 Drug-induced myopathy: Secondary | ICD-10-CM | POA: Insufficient documentation

## 2024-09-01 DIAGNOSIS — Z0000 Encounter for general adult medical examination without abnormal findings: Secondary | ICD-10-CM | POA: Insufficient documentation

## 2024-09-01 NOTE — Assessment & Plan Note (Signed)
 Continue levothyroxine , see notes on labs.

## 2024-09-01 NOTE — Assessment & Plan Note (Signed)
 She is still seeing [facility].  Mood d/w pt.  Her has had legal concerns, d/w pt.  Her dog died this year.  Discussed.  No SI/HI.  Compliant with meds.  Continue as is.

## 2024-09-01 NOTE — Assessment & Plan Note (Signed)
Continue PPI at baseline.

## 2024-09-01 NOTE — Assessment & Plan Note (Signed)
 She had used magic mouthwash prn for mouth pain with relief.  She couldn't get it filled, d/w pt.  Rx printed today.  I asked her to update me as needed.

## 2024-09-01 NOTE — Assessment & Plan Note (Signed)
Advance directive-daughter designated patient were incapacitated.

## 2024-09-01 NOTE — Assessment & Plan Note (Addendum)
 See notes on labs.  Plan on recheck in about 6 months.  A1c at the visit.   She had occ vomiting that predates ozempic  use, d/w pt.  Used zofran  prn.  Not escalating.  Benign abd exam today.  Routine cautions d/w pt.    If any more trouble with vomting, then let me know.  She may need to stop ozempic .

## 2024-09-01 NOTE — Assessment & Plan Note (Signed)
 Statin intolerant

## 2024-09-01 NOTE — Assessment & Plan Note (Signed)
 Flu discussed for the fall.   Shingles discussed.   PNA. Prev done.  Tetanus 2018. Routine vaccines d/w pt.   Colonoscopy 2023.   Mammogram pending 2025.  Advance directive-daughter designated patient were incapacitated  She is enrolled in lung cancer screening program.

## 2024-09-01 NOTE — Assessment & Plan Note (Signed)
 Smoking cessation d/w pt.  Still smoking some, no mood changes on med.  Chantix  helped some with tapering.  Continue chantix .

## 2024-09-01 NOTE — Assessment & Plan Note (Signed)
 H/o, see notes on labs.

## 2024-09-02 LAB — PTH, INTACT AND CALCIUM
Calcium: 10.8 mg/dL — ABNORMAL HIGH (ref 8.6–10.4)
PTH: 19 pg/mL (ref 16–77)

## 2024-09-02 LAB — FRUCTOSAMINE: Fructosamine: 274 umol/L (ref 205–285)

## 2024-09-03 ENCOUNTER — Encounter: Payer: Self-pay | Admitting: Family Medicine

## 2024-09-05 DIAGNOSIS — G894 Chronic pain syndrome: Secondary | ICD-10-CM | POA: Diagnosis not present

## 2024-09-05 DIAGNOSIS — M25569 Pain in unspecified knee: Secondary | ICD-10-CM | POA: Diagnosis not present

## 2024-09-05 DIAGNOSIS — K5900 Constipation, unspecified: Secondary | ICD-10-CM | POA: Diagnosis not present

## 2024-09-05 DIAGNOSIS — M549 Dorsalgia, unspecified: Secondary | ICD-10-CM | POA: Diagnosis not present

## 2024-09-05 DIAGNOSIS — M5412 Radiculopathy, cervical region: Secondary | ICD-10-CM | POA: Diagnosis not present

## 2024-09-05 DIAGNOSIS — F17203 Nicotine dependence unspecified, with withdrawal: Secondary | ICD-10-CM | POA: Diagnosis not present

## 2024-09-05 DIAGNOSIS — G4700 Insomnia, unspecified: Secondary | ICD-10-CM | POA: Diagnosis not present

## 2024-09-05 DIAGNOSIS — M5416 Radiculopathy, lumbar region: Secondary | ICD-10-CM | POA: Diagnosis not present

## 2024-09-05 DIAGNOSIS — M542 Cervicalgia: Secondary | ICD-10-CM | POA: Diagnosis not present

## 2024-09-05 DIAGNOSIS — Z79899 Other long term (current) drug therapy: Secondary | ICD-10-CM | POA: Diagnosis not present

## 2024-09-05 DIAGNOSIS — Z79891 Long term (current) use of opiate analgesic: Secondary | ICD-10-CM | POA: Diagnosis not present

## 2024-09-09 ENCOUNTER — Ambulatory Visit: Payer: Self-pay | Admitting: Family Medicine

## 2024-09-09 DIAGNOSIS — E119 Type 2 diabetes mellitus without complications: Secondary | ICD-10-CM

## 2024-09-17 ENCOUNTER — Encounter: Payer: Self-pay | Admitting: Family Medicine

## 2024-09-18 ENCOUNTER — Ambulatory Visit

## 2024-09-26 DIAGNOSIS — Z79899 Other long term (current) drug therapy: Secondary | ICD-10-CM | POA: Diagnosis not present

## 2024-09-27 ENCOUNTER — Encounter: Payer: Self-pay | Admitting: Pharmacist

## 2024-09-27 NOTE — Progress Notes (Signed)
 Pharmacy Quality Measure Review  This patient is appearing on a report for being at risk of failing the adherence measure for diabetes medications this calendar year.   Medication: Ozempic  0.5 mg Last fill date: 08/15/24 for 28 day supply  Patient is taking 0.25 mg weekly, so pens are lasting longer. She will continue to fail the metric, though often insurance won't pay for 0.25 mg/wk - no further action at this time.

## 2024-09-28 ENCOUNTER — Other Ambulatory Visit: Payer: Self-pay | Admitting: Family Medicine

## 2024-09-28 DIAGNOSIS — E119 Type 2 diabetes mellitus without complications: Secondary | ICD-10-CM

## 2024-10-01 ENCOUNTER — Other Ambulatory Visit: Payer: Self-pay | Admitting: Family Medicine

## 2024-10-01 DIAGNOSIS — Z8639 Personal history of other endocrine, nutritional and metabolic disease: Secondary | ICD-10-CM

## 2024-10-01 NOTE — Telephone Encounter (Unsigned)
 Copied from CRM [id number]. Topic: Clinical - Medication Refill >> Oct 01, 2024  4:02 PM Tysheama G wrote: Medication: Lanc[employer] [facility] DELICA PLUS LANCING) MISC  Has the patient contacted their pharmacy? Yes (Agent: If no, request that the patient contact the pharmacy for the refill. If patient does not wish to contact the pharmacy document the reason why and proceed with request.) (Agent: If yes, when and what did the pharmacy advise?)  This is the patient's preferred pharmacy:  [facility] [phone] [address], [place] - [address] [place] [address] Phone: [phone] Fax: [phone]   Is this the correct pharmacy for this prescription? Yes If no, delete pharmacy and type the correct one.   Has the prescription been filled recently? No  Is the patient out of the medication? Yes  Has the patient been seen for an appointment in the last year OR does the patient have an upcoming appointment? Yes  Can we respond through MyChart? No  Agent: Please be advised that Rx refills may take up to 3 business days. We ask that you follow-up with your pharmacy.

## 2024-10-02 MED ORDER — ONETOUCH DELICA PLUS LANCING MISC
1.0000 | Freq: Once | 0 refills | Status: AC
Start: 2024-10-02 — End: 2024-10-02

## 2024-10-07 ENCOUNTER — Other Ambulatory Visit: Payer: Self-pay

## 2024-10-07 MED ORDER — ACCU-CHEK SOFTCLIX LANCETS MISC: 12 refills | Status: AC

## 2024-10-10 ENCOUNTER — Ambulatory Visit

## 2024-10-14 ENCOUNTER — Other Ambulatory Visit: Payer: Self-pay | Admitting: Family Medicine

## 2024-10-15 NOTE — Telephone Encounter (Signed)
 Chantix  Last filled:  07/14/24, #60 Last OV:  08/29/24, annual exam Next OV:  02/27/25, 6 mo f/u

## 2024-10-16 NOTE — Telephone Encounter (Signed)
 Sent. Thanks.

## 2024-10-20 ENCOUNTER — Other Ambulatory Visit: Payer: Self-pay | Admitting: Family Medicine

## 2024-10-20 DIAGNOSIS — B001 Herpesviral vesicular dermatitis: Secondary | ICD-10-CM

## 2024-10-22 ENCOUNTER — Encounter: Payer: Self-pay | Admitting: Family Medicine

## 2024-10-22 DIAGNOSIS — B001 Herpesviral vesicular dermatitis: Secondary | ICD-10-CM | POA: Insufficient documentation

## 2024-10-22 NOTE — Telephone Encounter (Signed)
 Valtrex  Last filled:  07/22/24, #90 Last OV:  08/29/24, CPE Next OV:  02/27/25, 6 mo DM f/u

## 2024-11-12 ENCOUNTER — Ambulatory Visit
Admission: RE | Admit: 2024-11-12 | Discharge: 2024-11-12 | Disposition: A | Discharge status: Home / Self Care | Source: Ambulatory Visit | Attending: Family Medicine | Admitting: Family Medicine

## 2024-11-12 DIAGNOSIS — Z1231 Encounter for screening mammogram for malignant neoplasm of breast: Secondary | ICD-10-CM | POA: Diagnosis present

## 2024-11-17 ENCOUNTER — Ambulatory Visit: Payer: Self-pay | Admitting: Family Medicine

## 2024-11-28 ENCOUNTER — Other Ambulatory Visit: Payer: Self-pay | Admitting: Acute Care

## 2024-11-28 DIAGNOSIS — Z122 Encounter for screening for malignant neoplasm of respiratory organs: Secondary | ICD-10-CM

## 2024-11-28 DIAGNOSIS — F17210 Nicotine dependence, cigarettes, uncomplicated: Secondary | ICD-10-CM

## 2024-11-28 DIAGNOSIS — Z87891 Personal history of nicotine dependence: Secondary | ICD-10-CM

## 2024-11-29 LAB — HM DIABETES EYE EXAM

## 2024-12-05 ENCOUNTER — Encounter: Payer: Self-pay | Admitting: Family Medicine

## 2025-01-06 NOTE — Progress Notes (Signed)
 Leslie Wong                                          MRN: [id number]   01/06/2025   The [facility] Quality Team Specialist reviewed this patient medical record for the purposes of chart review for care gap closure. The following were reviewed: abstraction for care gap closure-glycemic status assessment.    [facility] Quality Team

## 2025-01-15 ENCOUNTER — Other Ambulatory Visit: Payer: Self-pay | Admitting: Family Medicine

## 2025-01-17 NOTE — Telephone Encounter (Signed)
 Left message to return call to our office.  Need to ask patient is she need refill? Is she taking? How is she doing with smoking?   Please provide message from provider/office when call is returned from patient.

## 2025-01-17 NOTE — Telephone Encounter (Signed)
 Patient returned call and she is currently taking medication and needs are refill. Patient states she is still currently smoking 10 cigarettes per day. Patient would like medication sent to the prefer pharmacy on file.

## 2025-01-22 NOTE — Telephone Encounter (Signed)
 Sent. Thanks.

## 2025-01-24 ENCOUNTER — Other Ambulatory Visit: Payer: Self-pay | Admitting: Family Medicine

## 2025-01-24 DIAGNOSIS — E119 Type 2 diabetes mellitus without complications: Secondary | ICD-10-CM

## 2025-01-29 NOTE — Telephone Encounter (Signed)
 I apologize.  I did not see the previous message about her symptoms.  You do not need to contact the patient.  Thanks.

## 2025-01-29 NOTE — Telephone Encounter (Signed)
 I sent both rxs.  Please check with patient about symptoms that led to the fluconazole  request and let us  know if she is not improving.  Thanks.

## 2025-02-27 ENCOUNTER — Ambulatory Visit: Admitting: Family Medicine

## 2025-07-08 ENCOUNTER — Ambulatory Visit

## 2025-07-09 ENCOUNTER — Ambulatory Visit
